# Patient Record
Sex: Female | Born: 1945 | Race: White | Hispanic: No | Smoking: Never smoker
Health system: Southern US, Community
[De-identification: ages and names within clinical notes are randomized; demographics above are authoritative.]

## PROBLEM LIST (undated history)

## (undated) DIAGNOSIS — N183 Chronic kidney disease, stage 3 unspecified: Secondary | ICD-10-CM

## (undated) DIAGNOSIS — R5383 Other fatigue: Secondary | ICD-10-CM

## (undated) DIAGNOSIS — R519 Headache, unspecified: Secondary | ICD-10-CM

## (undated) DIAGNOSIS — R112 Nausea with vomiting, unspecified: Secondary | ICD-10-CM

## (undated) DIAGNOSIS — F329 Major depressive disorder, single episode, unspecified: Secondary | ICD-10-CM

## (undated) DIAGNOSIS — M797 Fibromyalgia: Secondary | ICD-10-CM

## (undated) DIAGNOSIS — F419 Anxiety disorder, unspecified: Secondary | ICD-10-CM

## (undated) DIAGNOSIS — K5900 Constipation, unspecified: Secondary | ICD-10-CM

## (undated) DIAGNOSIS — R079 Chest pain, unspecified: Secondary | ICD-10-CM

## (undated) DIAGNOSIS — I509 Heart failure, unspecified: Secondary | ICD-10-CM

## (undated) DIAGNOSIS — G939 Disorder of brain, unspecified: Secondary | ICD-10-CM

## (undated) DIAGNOSIS — E119 Type 2 diabetes mellitus without complications: Secondary | ICD-10-CM

## (undated) DIAGNOSIS — M4712 Other spondylosis with myelopathy, cervical region: Secondary | ICD-10-CM

## (undated) DIAGNOSIS — R251 Tremor, unspecified: Secondary | ICD-10-CM

## (undated) DIAGNOSIS — E785 Hyperlipidemia, unspecified: Secondary | ICD-10-CM

## (undated) DIAGNOSIS — G43909 Migraine, unspecified, not intractable, without status migrainosus: Secondary | ICD-10-CM

## (undated) DIAGNOSIS — J189 Pneumonia, unspecified organism: Secondary | ICD-10-CM

## (undated) DIAGNOSIS — Z9989 Dependence on other enabling machines and devices: Secondary | ICD-10-CM

## (undated) DIAGNOSIS — I10 Essential (primary) hypertension: Secondary | ICD-10-CM

## (undated) DIAGNOSIS — M545 Low back pain, unspecified: Secondary | ICD-10-CM

## (undated) DIAGNOSIS — G8929 Other chronic pain: Secondary | ICD-10-CM

## (undated) DIAGNOSIS — D649 Anemia, unspecified: Secondary | ICD-10-CM

## (undated) DIAGNOSIS — M47816 Spondylosis without myelopathy or radiculopathy, lumbar region: Secondary | ICD-10-CM

## (undated) DIAGNOSIS — H269 Unspecified cataract: Secondary | ICD-10-CM

## (undated) DIAGNOSIS — Z9289 Personal history of other medical treatment: Secondary | ICD-10-CM

## (undated) DIAGNOSIS — I890 Lymphedema, not elsewhere classified: Secondary | ICD-10-CM

## (undated) DIAGNOSIS — N3281 Overactive bladder: Secondary | ICD-10-CM

## (undated) DIAGNOSIS — E039 Hypothyroidism, unspecified: Secondary | ICD-10-CM

## (undated) DIAGNOSIS — R0600 Dyspnea, unspecified: Secondary | ICD-10-CM

## (undated) DIAGNOSIS — R51 Headache: Secondary | ICD-10-CM

## (undated) DIAGNOSIS — M1990 Unspecified osteoarthritis, unspecified site: Secondary | ICD-10-CM

## (undated) DIAGNOSIS — K219 Gastro-esophageal reflux disease without esophagitis: Secondary | ICD-10-CM

## (undated) DIAGNOSIS — G2581 Restless legs syndrome: Secondary | ICD-10-CM

## (undated) DIAGNOSIS — Z9889 Other specified postprocedural states: Secondary | ICD-10-CM

## (undated) DIAGNOSIS — R609 Edema, unspecified: Secondary | ICD-10-CM

## (undated) DIAGNOSIS — I639 Cerebral infarction, unspecified: Secondary | ICD-10-CM

## (undated) DIAGNOSIS — M069 Rheumatoid arthritis, unspecified: Secondary | ICD-10-CM

## (undated) DIAGNOSIS — K911 Postgastric surgery syndromes: Secondary | ICD-10-CM

## (undated) DIAGNOSIS — F0390 Unspecified dementia without behavioral disturbance: Secondary | ICD-10-CM

## (undated) DIAGNOSIS — R55 Syncope and collapse: Secondary | ICD-10-CM

## (undated) DIAGNOSIS — F32A Depression, unspecified: Secondary | ICD-10-CM

## (undated) DIAGNOSIS — G4733 Obstructive sleep apnea (adult) (pediatric): Secondary | ICD-10-CM

## (undated) HISTORY — DX: Unspecified osteoarthritis, unspecified site: M19.90

## (undated) HISTORY — PX: CATARACT EXTRACTION W/ INTRAOCULAR LENS  IMPLANT, BILATERAL: SHX1307

## (undated) HISTORY — DX: Spondylosis without myelopathy or radiculopathy, lumbar region: M47.816

## (undated) HISTORY — DX: Disorder of brain, unspecified: G93.9

## (undated) HISTORY — DX: Depression, unspecified: F32.A

## (undated) HISTORY — PX: TOTAL KNEE ARTHROPLASTY: SHX125

## (undated) HISTORY — DX: Fibromyalgia: M79.7

## (undated) HISTORY — DX: Cerebral infarction, unspecified: I63.9

## (undated) HISTORY — DX: Essential (primary) hypertension: I10

## (undated) HISTORY — DX: Hyperlipidemia, unspecified: E78.5

## (undated) HISTORY — PX: TOTAL ABDOMINAL HYSTERECTOMY: SHX209

## (undated) HISTORY — PX: BACK SURGERY: SHX140

## (undated) HISTORY — PX: JOINT REPLACEMENT: SHX530

## (undated) HISTORY — PX: UPPER GI ENDOSCOPY: SHX6162

## (undated) HISTORY — DX: Heart failure, unspecified: I50.9

## (undated) HISTORY — PX: TUBAL LIGATION: SHX77

## (undated) HISTORY — DX: Unspecified cataract: H26.9

## (undated) HISTORY — DX: Lymphedema, not elsewhere classified: I89.0

## (undated) HISTORY — DX: Anxiety disorder, unspecified: F41.9

## (undated) HISTORY — PX: EXCISION/RELEASE BURSA HIP: SHX5014

## (undated) HISTORY — DX: Other fatigue: R53.83

## (undated) HISTORY — DX: Other spondylosis with myelopathy, cervical region: M47.12

## (undated) HISTORY — PX: SHOULDER ARTHROSCOPY WITH ROTATOR CUFF REPAIR: SHX5685

## (undated) HISTORY — PX: KNEE ARTHROSCOPY: SHX127

## (undated) HISTORY — DX: Major depressive disorder, single episode, unspecified: F32.9

## (undated) HISTORY — DX: Personal history of other medical treatment: Z92.89

## (undated) HISTORY — DX: Edema, unspecified: R60.9

## (undated) HISTORY — PX: LUMBAR DISC SURGERY: SHX700

## (undated) HISTORY — DX: Postgastric surgery syndromes: K91.1

## (undated) HISTORY — PX: UPPER GASTROINTESTINAL ENDOSCOPY: SHX188

## (undated) HISTORY — PX: COLONOSCOPY: SHX174

## (undated) HISTORY — DX: Chest pain, unspecified: R07.9

## (undated) HISTORY — DX: Syncope and collapse: R55

---

## 1964-08-24 HISTORY — PX: APPENDECTOMY: SHX54

## 1999-01-24 ENCOUNTER — Encounter: Payer: Self-pay | Admitting: Neurosurgery

## 1999-01-24 ENCOUNTER — Ambulatory Visit (HOSPITAL_COMMUNITY): Admission: RE | Admit: 1999-01-24 | Discharge: 1999-01-24 | Payer: Self-pay | Admitting: Neurosurgery

## 1999-02-20 ENCOUNTER — Other Ambulatory Visit: Admission: RE | Admit: 1999-02-20 | Discharge: 1999-02-20 | Payer: Self-pay

## 1999-09-23 ENCOUNTER — Encounter: Admission: RE | Admit: 1999-09-23 | Discharge: 1999-12-22 | Payer: Self-pay | Admitting: Specialist

## 2000-06-12 ENCOUNTER — Encounter: Payer: Self-pay | Admitting: Neurosurgery

## 2000-06-12 ENCOUNTER — Ambulatory Visit (HOSPITAL_COMMUNITY): Admission: RE | Admit: 2000-06-12 | Discharge: 2000-06-13 | Payer: Self-pay | Admitting: Neurosurgery

## 2000-06-13 ENCOUNTER — Encounter: Payer: Self-pay | Admitting: Neurosurgery

## 2001-08-10 ENCOUNTER — Encounter: Admission: RE | Admit: 2001-08-10 | Discharge: 2001-08-10 | Payer: Self-pay | Admitting: Neurosurgery

## 2001-08-10 ENCOUNTER — Encounter: Payer: Self-pay | Admitting: Neurosurgery

## 2002-03-02 ENCOUNTER — Encounter: Admission: RE | Admit: 2002-03-02 | Discharge: 2002-03-02 | Payer: Self-pay | Admitting: Neurosurgery

## 2002-03-02 ENCOUNTER — Encounter: Payer: Self-pay | Admitting: Neurosurgery

## 2002-05-07 ENCOUNTER — Observation Stay (HOSPITAL_COMMUNITY): Admission: EM | Admit: 2002-05-07 | Discharge: 2002-05-08 | Payer: Self-pay | Admitting: Emergency Medicine

## 2002-05-07 ENCOUNTER — Encounter: Payer: Self-pay | Admitting: Emergency Medicine

## 2002-08-04 ENCOUNTER — Encounter: Payer: Self-pay | Admitting: Neurosurgery

## 2002-08-04 ENCOUNTER — Encounter: Admission: RE | Admit: 2002-08-04 | Discharge: 2002-08-04 | Payer: Self-pay | Admitting: Neurosurgery

## 2002-11-27 ENCOUNTER — Encounter: Payer: Self-pay | Admitting: Neurosurgery

## 2002-11-27 ENCOUNTER — Encounter: Admission: RE | Admit: 2002-11-27 | Discharge: 2002-11-27 | Payer: Self-pay | Admitting: Neurosurgery

## 2002-12-18 ENCOUNTER — Encounter: Payer: Self-pay | Admitting: Rheumatology

## 2002-12-18 ENCOUNTER — Encounter: Admission: RE | Admit: 2002-12-18 | Discharge: 2002-12-18 | Payer: Self-pay | Admitting: Rheumatology

## 2003-03-18 ENCOUNTER — Emergency Department (HOSPITAL_COMMUNITY): Admission: EM | Admit: 2003-03-18 | Discharge: 2003-03-18 | Payer: Self-pay | Admitting: Emergency Medicine

## 2003-09-06 ENCOUNTER — Encounter: Admission: RE | Admit: 2003-09-06 | Discharge: 2003-09-06 | Payer: Self-pay | Admitting: Neurosurgery

## 2003-10-23 ENCOUNTER — Encounter: Admission: RE | Admit: 2003-10-23 | Discharge: 2003-10-23 | Payer: Self-pay | Admitting: Internal Medicine

## 2003-12-21 ENCOUNTER — Ambulatory Visit (HOSPITAL_COMMUNITY): Admission: RE | Admit: 2003-12-21 | Discharge: 2003-12-21 | Payer: Self-pay | Admitting: Orthopedic Surgery

## 2004-01-31 ENCOUNTER — Encounter: Admission: RE | Admit: 2004-01-31 | Discharge: 2004-01-31 | Payer: Self-pay | Admitting: Neurosurgery

## 2004-02-18 ENCOUNTER — Encounter: Admission: RE | Admit: 2004-02-18 | Discharge: 2004-02-18 | Payer: Self-pay | Admitting: Neurosurgery

## 2004-03-06 ENCOUNTER — Encounter: Admission: RE | Admit: 2004-03-06 | Discharge: 2004-03-06 | Payer: Self-pay | Admitting: Neurosurgery

## 2004-05-09 ENCOUNTER — Ambulatory Visit (HOSPITAL_COMMUNITY): Admission: RE | Admit: 2004-05-09 | Discharge: 2004-05-10 | Payer: Self-pay | Admitting: Orthopedic Surgery

## 2004-06-06 ENCOUNTER — Ambulatory Visit (HOSPITAL_COMMUNITY): Admission: RE | Admit: 2004-06-06 | Discharge: 2004-06-07 | Payer: Self-pay | Admitting: Orthopedic Surgery

## 2004-07-10 ENCOUNTER — Emergency Department (HOSPITAL_COMMUNITY): Admission: EM | Admit: 2004-07-10 | Discharge: 2004-07-10 | Payer: Self-pay | Admitting: *Deleted

## 2004-09-10 ENCOUNTER — Encounter: Admission: RE | Admit: 2004-09-10 | Discharge: 2004-09-10 | Payer: Self-pay | Admitting: Internal Medicine

## 2004-10-30 ENCOUNTER — Ambulatory Visit (HOSPITAL_COMMUNITY): Admission: RE | Admit: 2004-10-30 | Discharge: 2004-10-30 | Payer: Self-pay | Admitting: Neurosurgery

## 2004-10-31 ENCOUNTER — Inpatient Hospital Stay (HOSPITAL_COMMUNITY): Admission: RE | Admit: 2004-10-31 | Discharge: 2004-11-04 | Payer: Self-pay | Admitting: Orthopedic Surgery

## 2004-10-31 ENCOUNTER — Ambulatory Visit: Payer: Self-pay | Admitting: Physical Medicine & Rehabilitation

## 2004-12-09 ENCOUNTER — Encounter: Admission: RE | Admit: 2004-12-09 | Discharge: 2005-03-09 | Payer: Self-pay | Admitting: Orthopedic Surgery

## 2005-05-08 ENCOUNTER — Ambulatory Visit (HOSPITAL_COMMUNITY): Admission: RE | Admit: 2005-05-08 | Discharge: 2005-05-08 | Payer: Self-pay | Admitting: Internal Medicine

## 2005-05-21 ENCOUNTER — Encounter: Admission: RE | Admit: 2005-05-21 | Discharge: 2005-05-21 | Payer: Self-pay | Admitting: Neurosurgery

## 2005-06-02 ENCOUNTER — Encounter: Admission: RE | Admit: 2005-06-02 | Discharge: 2005-06-02 | Payer: Self-pay | Admitting: Neurosurgery

## 2005-06-10 ENCOUNTER — Encounter: Admission: RE | Admit: 2005-06-10 | Discharge: 2005-06-10 | Payer: Self-pay | Admitting: Internal Medicine

## 2005-06-15 ENCOUNTER — Encounter: Admission: RE | Admit: 2005-06-15 | Discharge: 2005-06-15 | Payer: Self-pay | Admitting: Neurosurgery

## 2005-06-19 ENCOUNTER — Ambulatory Visit (HOSPITAL_BASED_OUTPATIENT_CLINIC_OR_DEPARTMENT_OTHER): Admission: RE | Admit: 2005-06-19 | Discharge: 2005-06-19 | Payer: Self-pay | Admitting: Orthopedic Surgery

## 2005-06-19 ENCOUNTER — Ambulatory Visit (HOSPITAL_COMMUNITY): Admission: RE | Admit: 2005-06-19 | Discharge: 2005-06-19 | Payer: Self-pay | Admitting: Orthopedic Surgery

## 2005-10-23 ENCOUNTER — Ambulatory Visit (HOSPITAL_COMMUNITY): Admission: RE | Admit: 2005-10-23 | Discharge: 2005-10-23 | Payer: Self-pay | Admitting: Neurosurgery

## 2005-12-22 HISTORY — PX: LUMBAR LAMINECTOMY/DECOMPRESSION MICRODISCECTOMY: SHX5026

## 2006-01-01 ENCOUNTER — Inpatient Hospital Stay (HOSPITAL_COMMUNITY): Admission: RE | Admit: 2006-01-01 | Discharge: 2006-01-03 | Payer: Self-pay | Admitting: Neurosurgery

## 2006-03-28 ENCOUNTER — Encounter: Admission: RE | Admit: 2006-03-28 | Discharge: 2006-03-28 | Payer: Self-pay | Admitting: Neurosurgery

## 2006-04-24 HISTORY — PX: POSTERIOR LUMBAR FUSION: SHX6036

## 2006-05-18 ENCOUNTER — Inpatient Hospital Stay (HOSPITAL_COMMUNITY): Admission: RE | Admit: 2006-05-18 | Discharge: 2006-05-21 | Payer: Self-pay | Admitting: Neurosurgery

## 2006-09-21 ENCOUNTER — Encounter: Admission: RE | Admit: 2006-09-21 | Discharge: 2006-09-21 | Payer: Self-pay | Admitting: Internal Medicine

## 2006-11-10 ENCOUNTER — Encounter: Admission: RE | Admit: 2006-11-10 | Discharge: 2006-11-10 | Payer: Self-pay | Admitting: Internal Medicine

## 2006-11-15 ENCOUNTER — Encounter: Admission: RE | Admit: 2006-11-15 | Discharge: 2006-11-15 | Payer: Self-pay | Admitting: Internal Medicine

## 2006-12-29 ENCOUNTER — Encounter (HOSPITAL_COMMUNITY): Admission: RE | Admit: 2006-12-29 | Discharge: 2007-01-22 | Payer: Self-pay | Admitting: Endocrinology

## 2007-01-03 ENCOUNTER — Encounter: Admission: RE | Admit: 2007-01-03 | Discharge: 2007-02-02 | Payer: Self-pay | Admitting: Neurosurgery

## 2007-03-09 ENCOUNTER — Encounter: Admission: RE | Admit: 2007-03-09 | Discharge: 2007-03-09 | Payer: Self-pay | Admitting: Internal Medicine

## 2007-06-30 ENCOUNTER — Encounter: Admission: RE | Admit: 2007-06-30 | Discharge: 2007-06-30 | Payer: Self-pay | Admitting: Orthopedic Surgery

## 2007-08-25 HISTORY — PX: CORONARY ANGIOGRAM: SHX5786

## 2007-12-25 ENCOUNTER — Observation Stay (HOSPITAL_COMMUNITY): Admission: EM | Admit: 2007-12-25 | Discharge: 2007-12-27 | Payer: Self-pay | Admitting: Emergency Medicine

## 2008-01-10 ENCOUNTER — Ambulatory Visit (HOSPITAL_COMMUNITY): Admission: RE | Admit: 2008-01-10 | Discharge: 2008-01-10 | Payer: Self-pay | Admitting: *Deleted

## 2008-01-19 ENCOUNTER — Inpatient Hospital Stay (HOSPITAL_BASED_OUTPATIENT_CLINIC_OR_DEPARTMENT_OTHER): Admission: RE | Admit: 2008-01-19 | Discharge: 2008-01-19 | Payer: Self-pay | Admitting: Cardiology

## 2008-03-09 ENCOUNTER — Encounter: Admission: RE | Admit: 2008-03-09 | Discharge: 2008-03-09 | Payer: Self-pay | Admitting: Family Medicine

## 2009-06-28 ENCOUNTER — Encounter: Admission: RE | Admit: 2009-06-28 | Discharge: 2009-06-28 | Payer: Self-pay | Admitting: Family Medicine

## 2009-07-12 ENCOUNTER — Encounter: Admission: RE | Admit: 2009-07-12 | Discharge: 2009-07-12 | Payer: Self-pay | Admitting: Family Medicine

## 2010-09-14 ENCOUNTER — Encounter: Payer: Self-pay | Admitting: Neurosurgery

## 2010-09-14 ENCOUNTER — Encounter: Payer: Self-pay | Admitting: *Deleted

## 2010-12-01 ENCOUNTER — Ambulatory Visit: Payer: Medicare Other | Attending: Orthopedic Surgery | Admitting: Physical Therapy

## 2010-12-01 DIAGNOSIS — R5381 Other malaise: Secondary | ICD-10-CM | POA: Insufficient documentation

## 2010-12-01 DIAGNOSIS — M6281 Muscle weakness (generalized): Secondary | ICD-10-CM | POA: Insufficient documentation

## 2010-12-01 DIAGNOSIS — M25519 Pain in unspecified shoulder: Secondary | ICD-10-CM | POA: Insufficient documentation

## 2010-12-01 DIAGNOSIS — IMO0001 Reserved for inherently not codable concepts without codable children: Secondary | ICD-10-CM | POA: Insufficient documentation

## 2010-12-03 ENCOUNTER — Ambulatory Visit: Payer: Medicare Other | Admitting: Physical Therapy

## 2010-12-08 ENCOUNTER — Ambulatory Visit: Payer: Medicare Other | Admitting: Physical Therapy

## 2010-12-10 ENCOUNTER — Ambulatory Visit: Payer: Medicare Other | Admitting: Physical Therapy

## 2010-12-15 ENCOUNTER — Ambulatory Visit: Payer: Medicare Other | Admitting: Physical Therapy

## 2010-12-17 ENCOUNTER — Ambulatory Visit: Payer: Medicare Other | Admitting: Physical Therapy

## 2010-12-22 ENCOUNTER — Ambulatory Visit: Payer: Medicare Other | Admitting: Physical Therapy

## 2010-12-24 ENCOUNTER — Ambulatory Visit: Payer: Medicare Other | Attending: Orthopedic Surgery | Admitting: Physical Therapy

## 2010-12-24 DIAGNOSIS — M6281 Muscle weakness (generalized): Secondary | ICD-10-CM | POA: Insufficient documentation

## 2010-12-24 DIAGNOSIS — R5381 Other malaise: Secondary | ICD-10-CM | POA: Insufficient documentation

## 2010-12-24 DIAGNOSIS — IMO0001 Reserved for inherently not codable concepts without codable children: Secondary | ICD-10-CM | POA: Insufficient documentation

## 2010-12-24 DIAGNOSIS — M25519 Pain in unspecified shoulder: Secondary | ICD-10-CM | POA: Insufficient documentation

## 2010-12-29 ENCOUNTER — Ambulatory Visit: Payer: Medicare Other | Admitting: Physical Therapy

## 2010-12-31 ENCOUNTER — Encounter: Payer: Medicare Other | Admitting: Physical Therapy

## 2011-01-06 NOTE — Op Note (Signed)
[name], [name]              ACCOUNT NO.:  [id number]   MEDICAL RECORD NO.:  [id number]          PATIENT TYPE:  OIB   LOCATION:  2854                         FACILITY:  [facility]   PHYSICIAN:  Elmore Guise., M.D.DATE OF BIRTH:  04-Mar-1946   DATE OF PROCEDURE:  01/10/2008  DATE OF DISCHARGE:  01/10/2008                               OPERATIVE REPORT   INDICATIONS FOR PROCEDURE:  Syncope evaluate for vasovagal or  neurocardiogenic syncope.  Primary cardiologist, Dr. Peter Swaziland  Supervising cardiologist, Dr. Lady Deutscher.   PROCEDURE:  Tilt table test with and without isoproterenol.  Procedure  in risks were explained and informed consent was obtained.  The patient  was brought to the tilt table lab in a fasting nonsedated state.  A  peripheral IV had been placed in short stay.  After the patient was  placed on blood pressure, ECG, and pulse oximetry monitoring baseline  recordings in the supine position were done for 5 minutes.  The patient  was then placed in a 70-degree head up tilt position and monitored.  At  the end of 30 minutes, the patient was returned to the supine position.  Isoproterenol was begun at 0.5 mics per minute IV and increased until  the patient achieved a heart rate greater than 25% of resting.  The  patient then was placed back in the 70-degree tilt position.  This  position was maintained for 15 minutes.  Following 15 minutes upright  with isoproterenol infusion.  The isoproterenol infusion was stopped.  The patient was placed back in the supine position.  Vital signs were  recorded and monitored to return to baseline.  The patient was then  transferred back to the short-stay area in good condition.  Dr. Reyes Ivan  was present for the entire procedure.   RESULTS:  1. Baseline supine vital signs.  Showed heart rate of 67 per minute.      Normal sinus rhythm with blood pressure 176/87, saturation of 100%,      upright tilt at 70 degrees.  Initial vital  signs, heart rate of 79      beats per minute, blood pressure 184/105, and oxygen sat is 100%.      The patient started having heaviness in her chest at approximately      9 minutes into the upright tilt.  At that time, she reported right      leg shakiness.  Her heart rate increased to 104 beats per minute      showing sinus tach on telemetry.  Her blood pressure was 145/70.      Her O2 sat was 100%.  She continued of feeling shaky.  Her heart      rate ranged from 104-138 with blood pressures 150/80 to 186/119.      The patient denied any significant lightheadedness during this part      of the test.  After 30 minutes, the patient was laid back in the      supine position.  Her heart rate returned to normal with baseline      heart rate of 70-80  beats per minute.  Isuprel was started at 0.5      mics per minute and increased until she reached greater than 20%,      increase in heart rate.  Once her heart rate reach to 95 beats per      minute, she was tilted back at the 70-degree upright tilt position.      Her heart rate at this time in the upright position increased up to      145 beats per minute showing sinus tachycardia on telemetry      monitoring.  Her blood pressure was 162/88.  She did have one      isolated blood pressure of 63/50 on Dinamap, however, this was      rechecked and showed 142/79.  Her blood pressure then ranged from      141/60 to 154/90 with heart rates ranging from 150 at sinus tach      120 at sinus tach.  Her O2 sat remained at 98-99%.  Isuprel was      then stopped.  The patient was placed back in the supine position.      Her heart rate returned to 102 beats per minute with blood pressure      143/90.  Heart rate then decreased down into the 70-80 range and      blood pressure 140/90 to 150/100.  The patient tolerated procedure      well and was transferred back to short-stay without problem or      complication.   IMPRESSION:  Negative tilt table study  with and without isoproterenol.   RECOMMENDATIONS:  Discussed the results with the patient and her family.  If her symptoms continue would consider possible loop implant.  I will  discuss findings with Dr. Swaziland, her primary cardiologist, as to which  way he would like to proceed from here.      Elmore Guise., M.D.  Electronically Signed     TWK/MEDQ  D:  01/10/2008  T:  01/11/2008  Job:  [id number]   cc:   Peter M. Swaziland, M.D.

## 2011-01-06 NOTE — Cardiovascular Report (Signed)
NAMEALLIZON, [name]              ACCOUNT NO.:  [id number]   MEDICAL RECORD NO.:  [id number]          PATIENT TYPE:  OIB   LOCATION:  1967                         FACILITY:  [facility]   PHYSICIAN:  Peter M. Swaziland, M.D.  DATE OF BIRTH:  [birth year]   DATE OF PROCEDURE:  DATE OF DISCHARGE:  01/19/2008                            CARDIAC CATHETERIZATION   INDICATIONS FOR PROCEDURE:  This is a 65 year old [name] female who has  refractory symptoms of chest pain.  Prior cardiac evaluation including  Holter monitor, echocardiogram, adenosine Cardiolite study and tilt  table test have been negative.  She does have risk factors of obesity,  hypertension, and family history of coronary artery disease.   PROCEDURE:  Left heart catheterization, coronary, and left ventricular  angiography.   EQUIPMENT USED:  A 4-French 4-cm left Judkins catheter, 4-French 3DRC  catheter, 4-French pigtail catheter, and 4-French arterial sheath.  Access via the right femoral artery using standard Seldinger technique.   MEDICATIONS:  1. Versed 3 mg IV.  2. Fentanyl 25 mcg IV.  3. Xylocaine 1% locally for anesthesia.   HEMODYNAMIC RESULTS:  Aortic pressures 145/62 with a mean of 95 mmHg,  left ventricle pressure 141 with EDP of 17 mmHg.  There is no  significant aortic valve gradient.   Angiographic data of the left coronary arises and distributes normally.  The left main coronary artery is normal.   The left anterior descending artery has a large vessel and it appears  normal.   The left circumflex coronary artery is essentially one large obtuse  marginal vessel and it is normal.   The right coronary is a dominant vessel.  It is normal throughout its  course.   Left ventricular angiography was performed in RAO view.  This  demonstrates normal left ventricular size and contractility with normal  ejection fraction of 60%-65%.  There is no mitral regurgitation.  The  aortic root appears mildly dilated.   FINAL INTERPRETATION:  1. Normal coronary anatomy.  2. Normal left ventricular function.          ______________________________  Peter M. Swaziland, M.D.    PMJ/MEDQ  D:  01/19/2008  T:  01/20/2008  Job:  [id number]

## 2011-01-06 NOTE — H&P (Signed)
NAMEVEDANSHI, MASSARO              ACCOUNT NO.:  [id number]   MEDICAL RECORD NO.:  [id number]           PATIENT TYPE:   LOCATION:                                 FACILITY:   PHYSICIAN:  Peter M. Swaziland, M.D.  DATE OF BIRTH:  [birth year]   DATE OF ADMISSION:  01/11/2008  DATE OF DISCHARGE:                              HISTORY & PHYSICAL   HISTORY OF PRESENT ILLNESS:  Ms. Partridge is a 65 year old [place] female who  is admitted for cardiac catheterization for evaluation of ongoing chest  pain.  The patient has had extensive evaluation recently from a cardiac  standpoint.  She presented with symptoms of fatigue, near syncope, and  recurrent chest pain.  The patient was admitted from Dec 25, 2007 to Dec 27, 2007, after an episode of near syncope at church.  She had also been  admitted to the Hospital [facility] in late April 2009, after she  had been placed on diastolic and was found to be markedly bradycardic  and extremely fatigued.  At that time, her diastolic was discontinued.  She subsequently has had a 48-hour Holter monitor, which demonstrated  normal sinus rhythm with sinus bradycardia with average heart rate of  around 61.  She had no prolonged sustained severe bradycardia or pauses.  There were no symptoms recorded.  During her last hospital stay, she was  monitored for 48 hours without significant arrhythmia.  She has had an  echocardiogram dated Dec 23, 2007, this demonstrated mild concentric LVH  with normal systolic function.  She had normal valvular appearance and  function.  She also underwent an adenosine Cardiolite study on Dec 30, 2007, which was a normal study and she had a normal ejection fraction.  Most recent evaluation was with a tilt table test by Dr. Lady Deutscher,  which was a normal study.  The patient continues to complains of  episodes of substernal chest pain and heaviness.  She has no radiation  of her discomfort.  She denies any shortness of breath,  orthopnea, or  PND.  She has had no increase in edema.  She is still very concerned  about the potential of coronary blockage that was missed by stress  testing and therefore for definitive diagnosis, we have recommended  cardiac catheterization.  She still complains of symptoms of marked  fatigue, but has had no recurrent episodes of near syncope.   PAST MEDICAL HISTORY:  Significant for intermittent dumping syndrome.  She has a history of thyroid disease.  She has a history of arthritis  and status post knee replacement.  She has a history of gastroesophageal  reflux disease.  She has had three prior back surgeries and she has also  had previous rotator cuff repair.  She has had prior hysterectomy.   ALLERGIES:  She has no known drug allergies, but is intolerant to BETA  BLOCKERS due to marked bradycardia.   CURRENT MEDICATIONS:  1. Hydrochlorothiazide 12.5 mg daily.  2. Vicodin 5/500 mg 2 or 3 times daily as needed.  3. Protonix 40 mg per day.  4. Synthroid  75 mcg per day.  5. Celebrex 200 mg b.i.d.  6. Xanax 0.5 mg p.r.n.  7. She gets a B12 injection monthly.  8. Tandem plus b.i.d.   SOCIAL HISTORY:  The patient is disabled.  She denies tobacco or alcohol  use.  She is a widow.  She has 3 children.   FAMILY HISTORY:  Father died aged 22 with lung cancer.  Mother died aged  76, she had coronary artery disease and congestive heart failure.  One  brother died with cancer, 2 other brothers have died with heart disease.  One sister aged 58 is in good health.   REVIEW OF SYSTEMS:  Diffusely positive.  The patient has had episodes of  abdominal cramping, diarrhea, nausea, vomiting, and flushing.  She  complains that her skin becomes very blotchy during these episodes.  During some of these episodes, she has had severe hypertension that will  resolve by the time she is evaluated.  Under direction of Dr. Lysbeth Galas,  she has had evaluation including panendoscopy within the past year.   She  has also been referred to an endocrinologist in [place], but  despite some of her symptoms have not been able to find any significant  evidence of either carcinoid tumor or pheochromocytoma.   PHYSICAL EXAMINATION:  GENERAL:  The patient is an obese [place] female in  no distress.  VITAL SIGNS:  Weight is 253 pounds, pulse is 88 and regular, blood  pressure is 120/70.  HEENT:  Unremarkable.  NECK:  She has no JVD, adenopathy, thyromegaly or bruits.  LUNGS:  Clear.  CARDIAC:  Without gallop, murmur, rub, or click.  EXTREMITIES:  She has no lower extremity edema.  SKIN:  Her skin color is pasty and color is poor.  NEUROLOGIC:  Her neurologic exam is nonfocal.   IMPRESSION:  1. Ongoing chest pain, extensive cardiac evaluation till date has been      unremarkable; however, the patient does have cardiac risk factors      and ongoing pain of unclear etiology, definitive evaluation with      coronary status is recommended with cardiac catheterization.  2. Episodes of marked fatigue and near syncope.  Cardiac evaluation      again has been negative.  3. Intolerance to beta blockade.  4. Intermittent dumping syndrome.  5. Gastroesophageal reflux disease.  6. Hypertension.  7. Obesity.  8. Arthritis.   PLAN:  We will proceed with cardiac catheterization for definitive  evaluation of her coronary status.           ______________________________  Peter M. Swaziland, M.D.     PMJ/MEDQ  D:  01/12/2008  T:  01/13/2008  Job:  [id number]   cc:   Delaney Meigs, M.D.

## 2011-01-06 NOTE — Discharge Summary (Signed)
[name], [name]              ACCOUNT NO.:  [id number]   MEDICAL RECORD NO.:  [id number]          PATIENT TYPE:  INP   LOCATION:  3737                         FACILITY:  [facility]   PHYSICIAN:  Peter M. Swaziland, M.D.  DATE OF BIRTH:  January 25, 1946   DATE OF ADMISSION:  12/25/2007  DATE OF DISCHARGE:  12/27/2007                               DISCHARGE SUMMARY   HISTORY OF PRESENT ILLNESS:  [name] is a 65 year old [place] female who  presented after an episode of near syncope at church.  The patient was  at church the morning of admission and she was standing in the [place].  She had a prodrome of weakness, a sense of impending doom and  diaphoresis.  She sat down, but subsequently slumped over.  She states  she was still able to hear voices at this time and then subsequently  came to.  The patient was recently evaluated in my office on December 21, 2007, for symptoms of recurrent episodes similar to this.  She had  actually been evaluated at a hospital in [facility] recently after she  had had an episode where she was unable to be woken and was found to be  markedly bradycardic with heart rate down to 38.  She had been on  Bystolic and this was discontinued.  The patient has had episodes of  marked fatigue.  She feels like she just needs to take a nap and she  feels tired.  She has also had symptoms of a dumping syndrome where she  will suddenly get abdominal cramping, profuse diarrhea and these  episodes have been associated with tachycardia and elevated blood  pressure.  Previously, the family reported that she had been diagnosed  with carcinoid syndrome but on further discussions with Dr. Lysbeth Galas,  there has been no biomarker evidence of either pheochromocytoma or  carcinoid syndrome and the patient had evaluation by endocrinologist at  [facility] that was unrevealing.  She has had prior neurologic  evaluation with either CT or MRI of the head.  After we saw her in the  office this  week, we obtained an echocardiogram which was normal.  She  also had a 48-hour Holter monitor which demonstrated only 2 PVCs in 24  hours and 58 PACs.  There were no sustained tachy or bradyarrhythmias.  There was no AV block.   For details of her past medical history, social history, family history  and physical exam, please see admission history and physical was  noteworthy that on admission, the blood pressure was 140/65 and pulse  was 64 and regular.  Her ECG showed normal sinus rhythm with a normal  ECG and normal intervals.  Chest x-ray showed mild cardiomegaly without  evidence of acute disease.  Doucet count was 7000, hemoglobin 11.5,  hematocrit 34.4, and platelets 264,000.  Sodium is 140, potassium 3.7,  chloride 106, CO2 24, BUN 11, creatinine 1.09, glucose of 130.  Coags  were normal.  Liver function studies were normal.  Serial cardiac  enzymes including CPK-MB and troponins were negative x3.  Calcium was  9.5  HOSPITAL COURSE:  The patient was admitted to telemetry monitoring.  She  was observed for 2 days in the hospital.  She had no cardiac  arrhythmias, no significant bradycardia or tachycardic episodes.  She  was ambulated and had no further problems.  After discussion with Dr.  Lysbeth Galas, it was felt that the patient should be evaluated for possible  ischemic heart disease and she will be arranged to have an outpatient  adenosine Cardiolite study.  If this is unremarkable, then we will  arrange for her to have a tilt table test to rule out neurocardiogenic  syncope.   DISCHARGE DIAGNOSES:  1. Syncope, possible vasovagal episode verses neurocardiogenic      syncope.  2. History of recent chest pain.  3. History of bradycardia exacerbated by Bystolic.  4. Intermittent dumping syndrome of unclear etiology.  5. History of thyroid disease.  6. Arthritis status post knee replacement therapy.  7. Acid reflux disease.   PLAN:  The patient will continue on her prior  medications.  This  includes Protonix 40 mg per day, Synthroid 75 mcg per day, Celebrex 200  mg b.i.d., Tandem Plus b.i.d., hydrochlorothiazide 12.5 mg daily, Xanax  0.5 mg p.r.n., B12 tablet IM once a month.  The patient's adenosine  Cardiolite study will be scheduled in Dr. Elvis Coil office.   DISCHARGE STATUS:  Improved.           ______________________________  Peter M. Swaziland, M.D.     PMJ/MEDQ  D:  12/27/2007  T:  12/28/2007  Job:  [id number]   cc:   Delaney Meigs, M.D.

## 2011-01-06 NOTE — H&P (Signed)
[name], [name]              ACCOUNT NO.:  [id number]   MEDICAL RECORD NO.:  [id number]          PATIENT TYPE:  INP   LOCATION:  3729                         FACILITY:  [facility]   PHYSICIAN:  [name], M.D.   DATE OF BIRTH:  1946/05/31   DATE OF ADMISSION:  12/25/2007  DATE OF DISCHARGE:                              HISTORY & PHYSICAL   REASON FOR ADMISSION:  Requested by Dr. Devoria Albe, emergency room  physician for possible syncope.   SUBJECTIVE:  The patient is a 65 year old Caucasian female who was  evaluated earlier today by Dr. Devoria Albe in the emergency room at [facility] and was felt to be in need of admission because of 2  problems:  1. Fainting/passing out spells occurring 6 times over the past week.      We actually speak to the patient and her family, they can only      describe 4 episodes.  One episode may have actually been a true      blackout spell.  That occurred today at church.  The patient signs      for the hearing impaired and during the service she started to feel      a sense of impeding doom, weak and as though she may faint.  After      the sermon, she sat next to a friend and after sitting down slumped      down to the friend, then has disagreement about everything else      after that.  This is what prompted at the emergency room visit.      Though there is a history of 6 previous episodes, they can only      describe 3 others.  All these started a week ago.  [place] and her daughter could not wake her up from an afternoon nap.      EMS was eventually called, and she was taken to an emergency room      and evaluation was done.  There was some discussion of her heart      rate being in the 40s.  The story is very vague.  She signed off      the emergency room AMA.  Two subsequent episodes involved staring      observed by granddaughters and not well characterized.  She has      never hurt herself in any of these episodes.   She has never fallen.      There is no other associated symptoms.  2. Chest pain.  Poorly characterized by the patient.  It is      subxiphoid.  Not associated with the other episodes of passing      out and are fleeting.  There is no exertional component.   HABITS:  Does not smoke or drink.   SIGNIFICANT MEDICAL PROBLEMS:  1. Fibromyalgia.  2. 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  3. History of carcinoid syndrome.  4. Hypertension.  5. Gastroesophageal reflux.   FAMILY HISTORY:  Noncontributory.   SOCIAL HISTORY:  The patient is married.  She has husband, grown  children, and grandchildren.  She works as a Contractor.   REVIEW OF SYSTEMS:  The patient falls sleepy easily.  Her daughter  denies that the patient snores.  The patient does not sleep well at  night.   MEDICATIONS:  1. Protonix 40 mg per day.  2. Synthroid 75 mcg per day.  3. Celebrex 200 mg b.i.d.  4. Vicodin 5/500 2 tablets each evening.  5. Tandem plus twice a day.  6. HCTZ 12.5 mg per day.  7. Xanax 0.5 mg p.r.n.  8. Norvasc 2.5 mg p.r.n. for systolic pressure greater than 150/90.  9. B12 injections every month.   OBJECTIVE:  VITAL SIGNS:  On exam, blood pressure 140/65, heart rate is  64, and respirations 16.  GENERAL:  The patient is alert and oriented.  She is moderately obese.  SKIN:  Clear.  No nailbed cyanosis.  HEENT:  Unremarkable.  NECK:  No JVD or carotid bruits.  LUNGS:  Clear to auscultation and percussion, no rub.  CARDIAC:  No murmur.  No click.  ABDOMEN:  Soft.  Liver and spleen not palpable.  No obvious masses.  EXTREMITIES:  No edema.  Pedal pulses are difficult to palpate.  Radial  pulses are 2+.  NEURO:  Unremarkable.   IMAGING:  EKG reveals heart rate of 59.  No arrhythmias.   LABORATORY DATA:  Unremarkable so far, but all other data is not  available.   ASSESSMENT:  1. Spells with decreased consciousness/near-syncope of uncertain       cause.  Other potential differential is neurocardiogenic,      psychiatric/anxiety provoked, sleep apnea with hypersomnia      syndrome, other potential problems such as seizures.  2. Chest pain, not typical for coronary artery disease, probably      musculoskeletal.   PLAN:  1. Admit to telemetry.  2. I would discontinue IV nitroglycerin.  This was started by the ER      physicians.  I will rule out myocardial infarction with serial      enzymes.  3. Echo has already been performed in Dr. Swaziland office and that is      normal.  4. I will consider neuro and psychiatric evaluation.  Further      management will be per Dr. Elvis Coil discretion.      [name], M.D.  Electronically Signed     HWS/MEDQ  D:  12/25/2007  T:  12/26/2007  Job:  [id number]

## 2011-01-09 NOTE — H&P (Signed)
NAMELANIE, SCHELLING              ACCOUNT NO.:  [id number]   MEDICAL RECORD NO.:  [id number]          PATIENT TYPE:  INP   LOCATION:  3009                         FACILITY:  [facility]   PHYSICIAN:  Payton Doughty, M.D.      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   HISTORY:  This is a 65 year old, right-handed, [place] lady who about four  months ago underwent a L2-3 diskectomy.  Did extremely well and then was  moving some furniture and had recurrence of back pain.  MRs demonstrated a  disk recurrence at L2-3 and she is admitted for fusion at that level.   PAST MEDICAL HISTORY:  Medical history is fairly unremarkable.   PAST SURGICAL HISTORY:  She has had lumbar laminectomy in the remote past at  L5-S1.   ALLERGIES:  None.   SOCIAL HISTORY:  She does not smoke or drink and is retired.   FAMILY HISTORY:  Mother died at 64. Father is deceased.   REVIEW OF SYSTEMS:  Remarkable for back pain and leg pain.   PHYSICAL EXAMINATION:  HEENT:  Within normal limits.  She has good range of  motion of neck.  CHEST:  Clear.  CARDIAC:  Regular rate and rhythm.  ABDOMEN:  Nontender. No hepatosplenomegaly.  EXTREMITIES:  Without clubbing or cyanosis.  GU:  Deferred.  PULSES:  Peripheral pulses are good.  NEUROLOGIC:  She is awake, alert and oriented.  Cranial nerves intact.  Motor exam shows 5/5 strength throughout the upper and lower extremities  save for the hip flexors on the left.  They are 5-/5.  There is a left L2  sensory deficit.  Reflexes are 1 at the knees, absent at the ankles. Toes  downgoing bilaterally.  Straight leg raise is negative.   MR demonstrates a small disk recurrence at L2-3.   CLINICAL IMPRESSION:  L2-3 disk recurrence, less than six months after  diskectomy.   PLAN:  Lumbar laminectomy, diskectomy and  posterior lumbar interbody fusion  with right-sided fusion cages and probably pedicle screws.  The risks and  benefits of this approach has been discussed with her and she wishes to  proceed.           ______________________________  Payton Doughty, M.D.     MWR/MEDQ  D:  05/18/2006  T:  05/19/2006  Job:  [id number]

## 2011-01-09 NOTE — Op Note (Signed)
NAMELETITA, [name]              ACCOUNT NO.:  [id number]   MEDICAL RECORD NO.:  [id number]          PATIENT TYPE:  INP   LOCATION:  3020                         FACILITY:  [facility]   PHYSICIAN:  Payton Doughty, M.D.      DATE OF BIRTH:  02-20-1946   DATE OF PROCEDURE:  01/01/2006  DATE OF DISCHARGE:                                 OPERATIVE REPORT   PREOPERATIVE DIAGNOSIS:  Herniated disk L2-3 on the left.   POSTOPERATIVE DIAGNOSIS:  Herniated disk L2-3 on the left.   OPERATIVE PROCEDURE:  L2-3 laminectomy and diskectomy.   SURGEON:  Payton Doughty, M.D., neurosurgery.   ANESTHESIA:  General endotracheal.   PREPARATORY:  Sterile Betadine prep and scrub with a alcohol wipe.   COMPLICATIONS:  None.   NURSE ASSISTANT:  [place].   HISTORY:  This is a 65 year old with a disk at L2-3 centrally with more left-  sided symptoms.   DESCRIPTION OF PROCEDURE:  The patient was taken to the operating room,  fully identified and intubated; placed prone on the operating room table.  Following shave, prep and drape in the usual sterile fashion, the skin was  infiltrated with 1% lidocaine with 1:400,000 epinephrine.  The skin was  incised from the top of L1 to the bottom of L2.  The lamina of L1 was  exposed on the left side.  Intraoperative x-ray showed we were at the one to  next level down.  The lamina of L2 was dissected in the subperiosteal plane.  The McCullough retractor placed.  Laminectomy was performed with a high-  speed drill and a Kerrison, up to the top of the ligamentum flavum.  It was  removed in a retrograde fashion, exposing the lateral aspect of the thecal  sac.  This was mobilized centrally, and the herniated disk was immediately  obvious.  The disk was opened and the disk material evacuated.  As the  evacuation of disk material resulted in marked decompression of the thecal  sac as well as the nerve root on that side, the wound was irrigated and  hemostasis assured.   Depo-Medrol -soaked fat was placed in the laminectomy  defect, and this incision was closed successfully in layers with 2-0 Vicryl,  3-0 Vicryl and 2-0 nylon.  Betadine Telfa dressing was applied and made  occlusive at the operative site.  The patient was returned to the recovery  room in good condition.           ______________________________  Payton Doughty, M.D.     MWR/MEDQ  D:  01/01/2006  T:  01/02/2006  Job:  [id number]

## 2011-01-09 NOTE — Op Note (Signed)
NAME:  [name], [name]                        ACCOUNT NO.:  [id number]   MEDICAL RECORD NO.:  [id number]                   PATIENT TYPE:  OIB   LOCATION:  5001                                 FACILITY:  [facility]   PHYSICIAN:  Harvie Junior, M.D.                DATE OF BIRTH:  1946-04-24   DATE OF PROCEDURE:  05/09/2004  DATE OF DISCHARGE:  05/10/2004                                 OPERATIVE REPORT   PREOPERATIVE DIAGNOSIS:  Painful left hip with iliotibial band tendinitis  which has improved with injection therapy but failed further conservative  care.   POSTOPERATIVE DIAGNOSIS:  1.  Painful left hip with iliotibial band tendinitis which has improved with      injection therapy but failed further conservative care.  2.  Bone spur off of the trochanteric region.   PRINCIPAL PROCEDURE:  1.  Operative hip arthroscopy with debridement of bursal tissue.  2.  Open release of iliotibial band and debridement of bursa.  3.  Partial exostectomy of the greater trochanter by way of a spur takedown.   SURGEON:  Jodi Geralds, M.D.   ASSISTANT:  Marshia Ly, M.D.   ANESTHESIA:  General.   BRIEF HISTORY:  This is a 65 year old female with a long history of having  had iliotibial band tendinitis in the left hip as well as the right hip.  She had been treated over about an 18 month period with multiple injections  into the trochanteric area which had done wonderfully for her, but her pain  recurred each time.  We saw her and tried injection therapy, we tried  conservative care with physical therapy, none of this seemed to work and  does continue with complaints of pain in the trochanteric area.  We talked  about release of this trochanteric area.  She was brought to the operating  room for this procedure.   PROCEDURE:  The patient was brought to the operating room and after adequate  anesthesia was obtained with general anesthesia the patient was placed upon  on the operating table.  She  was moved in the right lateral decubitus  position, all bony prominences were well padded and an axillary roll was put  in place.  Attention was then turned to the left hip where routine prep and  drape was undertaken.  At this point, arthroscopic hip instruments were used  to cannulate the IT band from the distal as well as proximal and these two  were touched together underneath the IT band, camera was then instituted and  a shaver was used and a radical bursectomy was undertaken of the iliotibial  band area.  Following this, under direct vision the IT band was released  with a Bovie from distal to proximal and following this the large Metzenbaum  scissors from the vascular service were used to follow along in this groove  and complete the release.  This was done  again under direct vision  arthroscopically.  At this point, attention was turned to the lateral  trochanter which could be palpated through one of the portals which had been  made.  The sharpened area of the trochanter was identified and this was  debrided with a motorized bur.  This was really at the junction of the  gluteus medius and the origin of the vastus lateralis muscle.  Debridement  of this are was undertaken with a bur and the patient then had the camera  replaced.  More bursectomy was undertaken with anterior, posterior and  lateral aspect of the trochanter in the peritrochanteric area.  At this  point, the wound was copiously irrigated and suctioned dry.  These  arthroscopic portals were closed with nylon stitching and sterile  compressive dressing was applied and the patient taken to the recovery room  where she was noted to be in satisfactory condition./   ESTIMATED BLOOD LOSS FOR PROCEDURE:  Less than 50 mL.      JLG/MEDQ  D:  05/09/2004  T:  05/11/2004  Job:  [id number]   cc:   Medical Record

## 2011-01-09 NOTE — Op Note (Signed)
NAME:  [name], [name]              ACCOUNT NO.:  [id number]   MEDICAL RECORD NO.:  [id number]          PATIENT TYPE:  AMB   LOCATION:  DSC                          FACILITY:  [facility]   PHYSICIAN:  Harvie Junior, M.D.   DATE OF BIRTH:  1946/01/12   DATE OF PROCEDURE:  06/19/2005  DATE OF DISCHARGE:                                 OPERATIVE REPORT   PREOPERATIVE DIAGNOSIS:  Medial meniscal tear.   POSTOPERATIVE DIAGNOSES:  1.  Medial meniscal tear.  2.  Chondromalacia of the medial compartment.  3.  Chondromalacia of the lateral compartment.  4.  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   PROCEDURES:  1.  Partial and posterior horn medial meniscectomy.  2.  Debridement of chondromalacia in the lateral femoral compartment.  3.  Debridement of chondromalacia patella.   SURGEON:  Harvie Junior, M.D.   ASSISTANT:  Marshia Ly, P.A.   ANESTHESIA:  General.   HISTORY:  Ms. [name] is a 65 year old female with a long history of having  significant pain in the right knee.  She was having catching and locking  type symptoms.  I talked about treatment options and ultimately felt that  arthroscopic intervention was the most appropriate course of action and she  is brought to the operating room for this procedure.   PROCEDURE:  The patient was brought to the operating room, after which  satisfactory anesthesia was obtained with general affect, the patient was  placed on the operating table.  The right leg was prepped and draped in the  usual sterile fashion.  Following this, routine arthroscopic examination of  the knee revealed there was obvious significant chondromalacia of the medial  femoral condyle, as well as a posterior horn medial meniscal tear.  Following this, the posterior horn of the medial meniscus was debrided.  It  was probed and felt to be stable in all directions.  The medial femoral  condyle was then debrided back to a  smooth and stable rim.  Attention was  turned laterally where there was grade 2 changes in the lateral tibial  plateau and lateral femoral condyle.  Lateral meniscus was within normal  limits.  ACL was within normal limits.  Attention was turned up into the  patellofemoral joint where there was some grade 3 changes in the  patellofemoral trochlea which were debrided, as well as some grade 2 changes  up in the undersurface of the patella.  These were debrided.  A final check  was made for any loose fragmenting pieces.  A 6 L of normal saline  irrigation was instilled in the knee to wash out the knee thoroughly.  At  this point the knee was copiously  irrigated and suctioned dry.  The portals were closed with a bandage.  Sterile compression dressing was applied.  The patient was taken to the  recovery room.  She was noted to be in satisfactory condition.  Estimated  blood loss for the procedure was ____________.      Harvie Junior, M.D.  Electronically Signed  JLG/MEDQ  D:  06/19/2005  T:  06/20/2005  Job:  [id number]

## 2011-01-09 NOTE — Discharge Summary (Signed)
[name], [name]              ACCOUNT NO.:  [id number]   MEDICAL RECORD NO.:  [id number]          PATIENT TYPE:  INP   LOCATION:  3009                         FACILITY:  [facility]   PHYSICIAN:  Payton Doughty, M.D.      DATE OF BIRTH:  1945/11/06   DATE OF ADMISSION:  05/18/2006  DATE OF DISCHARGE:  05/21/2006                                 DISCHARGE SUMMARY   ADMITTING DIAGNOSIS:  Spondylosis recurrent, L2-L3.   DISCHARGE DIAGNOSIS:  Facet fracture L2-L3.   SERVICE:  Neurosurgery.   PROCEDURE:  L2-3 laminectomy, diskectomy, posterior lumbar interbody fusion,  intervertebral fusion cages, posterolateral arthrodesis of P1.   COMPLICATIONS:  None.   DISCHARGE STATUS:  Alive and well by a taxi.   NOTE:  This is a 65 year old, right-handed, [place] woman whose history and  physical is recounted on the chart.  She had a disk done a couple months  ago, was lifting a couch and re-injured her back and presents now for  fusion.  Exam and history are in the H&P.  She was admitted after I obtained  normal laboratory values and underwent a fusion.  It was found that she had  a fracture at the site of her old laminectomy.   Postoperatively, she has done well.  She has got a little bit of right leg  pain.  She is up and mobile without using oral medication for pain control.  Flexeril has been helpful for spasm.  Her incision dry and well healing.   She is being sent home on Neurontin for neuropathic pain, Percocet for  incisional pain, Flexeril for spasms, and Cipro for bladder coverage for 5  days.   FOLLOWUP:  Will be in the [facility] office in about  a week for sutures.           ______________________________  Payton Doughty, M.D.     MWR/MEDQ  D:  05/21/2006  T:  05/22/2006  Job:  [id number]

## 2011-01-09 NOTE — Discharge Summary (Signed)
[name], [name]              ACCOUNT NO.:  [id number]   MEDICAL RECORD NO.:  [id number]          PATIENT TYPE:  INP   LOCATION:  5007                         FACILITY:  [facility]   PHYSICIAN:  Harvie Junior, M.D.   DATE OF BIRTH:  26-Dec-1945   DATE OF ADMISSION:  10/31/2004  DATE OF DISCHARGE:  11/04/2004                                 DISCHARGE SUMMARY   ADMITTING DIAGNOSES:  1.  Degenerative joint disease, left knee, end stage.  2.  Hypertension.  3.  History of chronic anemia.  4.  Obesity.   DISCHARGE DIAGNOSES:  1.  Degenerative joint disease, left knee, end stage.  2.  Hypertension.  3.  History of chronic anemia.  4.  Obesity.  5.  Hypokalemia.   PROCEDURES IN HOSPITAL:  Left total knee arthroplasty, Jodi Geralds, MD,  October 31, 2004.   BRIEF HISTORY:  [name] is a 65 year old female, who has a long history of  left knee pain.  She has night pain and pain with ambulation.  X-rays of the  left knee, which were weightbearing, showed that she had bone-on-bone  arthritis.  She only got temporary relief with arthroscopy of the knee and  cortisone injections and modification of her activity.  It came to the point  where she needed pain medication in order to perform activities of daily  living, and based upon her clinical and radiographic findings, she was felt  to be a candidate for a left total knee arthroplasty and is admitted for  this.   PERTINENT LABORATORY FINDINGS:  A hemoglobin on admission was 12.1,  hematocrit 36.2.  Her BMET was within normal limits on admission.  On postop  day #1, her hemoglobin was 8.8 with hematocrit of 26.1, on postop day #2 it  was 8.9, and on postop day #3 it was 8.1.  Her protime on admission was 13.9  seconds with an INR of 1.1.  On postop day #2 on Coumadin, her INR was 1.7,  on postop day #3, it was 3.1, and on November 04, 2004, it was 2.6.  On postop  day #1, her potassium was slightly decreased at 3.3.   HOSPITAL COURSE:  The  patient was admitted and taken to the operating room  on October 31, 2004, where she underwent a left total knee arthroplasty that  is well-described in Dr. Luiz Blare' operative note.  Postoperatively, she was  put on a PCA morphine pump for pain control and was given Coumadin for DVT  prophylaxis.  A CPM machine was used and was given five doses of IV Ancef 1  gm IV preoperatively.  She was given 80 mg of Gentamycin IV and 1 gm of  Ancef prophylactically.  Physical therapy was ordered for walker ambulation  and weightbearing as tolerated on the left side.  On postop day #1, she had  pain as expected.  She was afebrile and her vital signs were stable.  Her  hemoglobin was 8.8, her BMET was within normal limits, with a slightly  decreased potassium at 3.3.  Her INR was 1.1.  Her __________ was intact  to  the left lower extremity.  She was continued with physical therapy and was  gotten out of bed, and the current care was continued.  On postop day #2,  she was doing well with no complaints, had a low-grade fever of 100.9.  Her  vital signs were stable.  Her dressing was changed to the left knee.  Her  Hemovac drain was pulled.  Her hemoglobin was 8.9.  Her INR was 1.7.  Her  Foley catheter, which had been placed at the time of surgery, was removed,  and her PCA pump was discontinued, and her IV was converted to a Hep-Lock.  She had progressed well with physical therapy.  On postop day #3, she had no  complaints, she was progressing well, had a low-grade fever, INR was up to  3.1.  She was continued on oral iron as her hemoglobin was 8.1.  On postop  day #4, she was without complaints, she was taking fluids and voiding  without difficulty, her vital signs were stable and she was afebrile, her  left knee wound was benign, her calf was soft, __________ was intact  distally, and her INR was 2.6.  She was discharged home in improved  condition, was on a regular diet, was given an RX for Percocet p.r.n.  for  pain, #40, 1-2 q.4-6h p.r.n. pain, Coumadin x1 month postop for DVT  prophylaxis managed by outpatient pharmacy, and Prozac 20 mg, #30, one  daily.  She was instructed to ambulate weightbearing as tolerated on the  left with a walker.  She will need Home Health physical therapy, a home CPM  machine, and home Coumadin management.  She will follow up with Dr. Luiz Blare  in his office in 10 days.       JB/MEDQ  D:  02/06/2005  T:  02/07/2005  Job:  [id number]   cc:   [facility]  Marcello Fennel

## 2011-01-09 NOTE — H&P (Signed)
NAMEFOREVER, ARECHIGA              ACCOUNT NO.:  [id number]   MEDICAL RECORD NO.:  [id number]          PATIENT TYPE:  INP   LOCATION:  2899                         FACILITY:  [facility]   PHYSICIAN:  [name], M.D.      DATE OF BIRTH:  [birth year]   DATE OF ADMISSION:  01/01/2006  DATE OF DISCHARGE:                                HISTORY & PHYSICAL   ADMISSION DIAGNOSIS:  Herniated disc L2-3.   HISTORY OF PRESENT ILLNESS:  This is a 65 year old right-handed [name] lady I  have been following for numerous years. She has lumbar spondylosis and pain  beginning in her back and both legs.  MR showed a large herniated disc at L2-  3 and she is admitted for discectomy.   MEDICATIONS:  Lasix, Premarin and Tylenol No. 3 and Advil.   ALLERGIES:  NO KNOWN DRUG ALLERGIES.   PAST MEDICAL HISTORY:  1.  Hypertension.  2.  Obesity.  3.  Fluid retention.   PAST SURGICAL HISTORY:  1.  Appendectomy in 1967.  2.  Hysterectomy in 1984.  3.  Foot operation in 1990.  4.  Oophorectomy in 1995.   SOCIAL HISTORY:  She does not smoke or drink and is on disability.   FAMILY HISTORY:  Not given.   REVIEW OF SYSTEMS:  Remarkable for back pain and leg pain.   PHYSICAL EXAMINATION:  HEENT:  Within normal limits.  NECK:  She has good range of motion of her neck.  CHEST:  Clear.  CARDIOVASCULAR:  Regular rate and rhythm.  ABDOMEN:  Nontender, no hepatosplenomegaly but somewhat large.  EXTREMITIES:  Without clubbing or cyanosis.  GU:  Examination is deferred.  VASCULAR:  Peripheral pulses are good.  NEUROLOGICAL:  She is awake, alert and oriented.  Cranial nerves are intact.  Motor exam shows 5/5 strength throughout the upper and lower extremities  save for the hip flexors on the right.  Reflexes are 1 at the knees, 1 at  the ankles, toes downgoing bilaterally. Straight leg raising is positive on  the right.   CLINICAL IMPRESSION:  Herniated disc L2-3.   PLAN:  Lumbar laminectomy and discectomy.  The  risks and benefits of this  approach have been discussed with her and she wished to proceed.           ______________________________  [name], M.D.     MWR/MEDQ  D:  01/01/2006  T:  01/01/2006  Job:  [id number]

## 2011-01-09 NOTE — Op Note (Signed)
NAMESHONTELL, PROSSER              ACCOUNT NO.:  [id number]   MEDICAL RECORD NO.:  [id number]          PATIENT TYPE:  INP   LOCATION:  5007                         FACILITY:  [facility]   PHYSICIAN:  Harvie Junior, M.D.   DATE OF BIRTH:  Apr 02, 1946   DATE OF PROCEDURE:  10/31/2004  DATE OF DISCHARGE:                                 OPERATIVE REPORT   PREOPERATIVE DIAGNOSIS:  End-stage degenerative joint disease, left knee.   POSTOPERATIVE DIAGNOSIS:  End-stage degenerative joint disease, left knee.   PROCEDURE:  Total knee replacement with Sigma system, size 3 tibia, size 3  femur, 10 mm bridging bearing, 35 mm all-poly patella.   SURGEON:  Harvie Junior, MD.   ASSISTANT:  Marshia Ly, PA.   ANESTHESIA:  General.   BRIEF HISTORY:  A 65 year old female with a long history of having  significant end-stage degenerative joint disease of the left knee.  The  patient has been treated with anti-inflammatory medications and arthroscopic  debridement, and because of continuing complaints of pain and failure of all  conservative care, the patient is taken to the operating room for total knee  replacement.   PROCEDURE:  The patient was taken to the operating room and after adequate  anesthesia was obtained with general anesthetic, the patient was placed  supine on the operating table.  The left leg was prepped and draped in the  usual sterile fashion.  Following this, the leg was exsanguinated.  The  blood pressure tourniquet was inflated to 350 mmHg.  Following this, a  midline incision was made through the subcutaneous tissues and taken down to  the level of the extensor mechanism, which was clearly identified and  divided in a medial parapatellar arthrotomy.  A medial and lateral  meniscectomy was then performed as well as anterior and posterior cruciate  excision.  Attention was turned to the tibia, which was cut perpendicular to  its long axis.  Attention was then turned to the  femur, which after  intramedullary piloting, the femur was cut perpendicular to its long axis.  At this point, a __________ was placed for a 10-mm bearing and excellent  full extension was achieved.  Attention was then turned to sizing, we sized  to a size 3, anterior and posterior cuts were made as well as chamfers, a  box was then cut, and the trial femur was put in place.  Attention was  turned to the tibia, which was sized for a size 3.  It was fitted and the  keel was then punched after it was drilled centrally.  At this time, a trial  tibia and trial femur were put in place, a 10 mm bridging bearing, and  excellent full extension was achieved.  The patella was cut free-hand to a  level of 14 mm.  A 35 mm all-polyethylene patella was chosen and put in  place.  At this point, the trial components were removed.  The knee was  copiously irrigated and suctioned dry.  The final components were then  cemented into place after the knee had been copiously lavaged  and suctioned  dry.  Once the final components were cemented into place, a size 3 femur, a  size 3, a 10 mm bridging bearing, and a 35 mm all-polyethylene patella, all  excess cement was removed after drying and the medium Hemovac drain was  placed.  The median parapatellar arthrotomy was then closed.  Excellent  range of motion was achieved with no tendency towards instability.  After  closure of the median parapatellar arthrotomy, the  skin was closed with 0 and 2-0 Vicryl and skin staples.  A sterile  compressive dressing was applied as well as a knee immobilizer.  The patient  was taken to the recovery room where she was noted to be in satisfactory  condition.  Estimated blood loss for the procedure was none.      JLG/MEDQ  D:  10/31/2004  T:  11/01/2004  Job:  [id number]

## 2011-01-09 NOTE — H&P (Signed)
NAME:  [name], [name]                        ACCOUNT NO.:  [id number]   MEDICAL RECORD NO.:  [id number]                   PATIENT TYPE:  INP   LOCATION:  2041                                 FACILITY:  [facility]   PHYSICIAN:  Peter M. Swaziland, M.D.               DATE OF BIRTH:  [birth year]   DATE OF ADMISSION:  05/07/2002  DATE OF DISCHARGE:  05/08/2002                                HISTORY & PHYSICAL   HISTORY OF PRESENT ILLNESS:  The patient is a 65 year old [place] female with  history of hypertension, obesity, and hypercholesterolemia who presents to  the emergency room today for evaluation of chest pain.  She describes  chronic soreness in her anterior chest.  This morning she developed a sharp  left precordial chest pain beneath her left breast.  There was no associated  shortness of breath, nausea, vomiting, or diaphoresis.  She had some  discomfort in the third to fifth digits on her left hand.  This was relieved  with rest.  However, she noticed when she tried to walk later in the day the  pain recurred and she presented to the emergency room.  She has no prior  history of myocardial infarction.   PAST MEDICAL HISTORY:  Question a history of mitral valve prolapse by  echocardiogram six months ago, history of hypertension,  hypercholesterolemia, obesity, and arthritis.  She is status post spinal  surgery by Dr. Channing Mutters.  She has had previous rotator cuff surgery by Dr.  Thomasena Edis and had previous hysterectomy.   MEDICATIONS:  1. Omeprazole 20 mg q.d.  2. Feldene 10-20 mg q.d.  3. HCTZ 25 mg q.d.  4. Enalapril 10 mg b.i.d.  5. Mevacor 40 mg q.d.  6. Atenolol 50 mg q.d.  7. Hydrocodone two tablets q.h.s. p.r.n.   ALLERGIES:  She has no known allergies.   SOCIAL HISTORY:  The patient is married.  She has three children.  She is a  nonsmoker, nondrinker.   FAMILY HISTORY:  Mother has had coronary disease and is status post  stenting.   REVIEW OF SYMPTOMS:  Remarkable for  occasional trembling and shaking.  Otherwise, review of systems is negative.   PHYSICAL EXAMINATION:  GENERAL:  The patient is an obese [place]inzer female in no  apparent distress.  VITAL SIGNS:  Blood pressure 142/80, pulse 90 and regular, respirations 20.  She is afebrile.  HEENT:  Unremarkable.  Pupils are equal, round, and reactive.  Conjunctivae  are clear.  Oropharynx is clear.  NECK:  Without JVD, adenopathy, thyromegaly, or bruits.  LUNGS:  Clear to auscultation and percussion.  CARDIAC:  Regular rate and rhythm.  Normal S1 and S2 without gallops,  murmurs, rubs, or clicks.  ABDOMEN:  Soft, very obese without masses or bruits.  CHEST:  Wall does demonstrate significant tenderness to palpation in the  left parasternal region.  NEUROLOGIC:  Intact.  EXTREMITIES:  Very obese,  soft.  Pulses are 2+ and symmetric.  There is no  significant edema.   LABORATORY DATA:  Chest x-ray shows no active disease.  ECG shows normal  sinus rhythm with nonspecific ST abnormality.  CK 140 with 2.3 MB, troponin  0.02.  Lahm count 6900, hemoglobin 11.6, hematocrit 35.6, platelets  280,000.  Sodium 137, potassium 3.3, chloride 103, CO2 28, BUN 14,  creatinine 1.1, glucose 101.   IMPRESSION:  1. Chest pain with atypical features.  Her examination suggests     musculoskeletal pain.  However, need to rule out myocardial infarction.     The patient with multiple cardiac risk factors.  2. Hypertension.  3. Hypercholesterolemia.  4. Obesity.  5. Arthritis.   PLAN:  The patient will be admitted to telemetry.  Rule out myocardial  infarction.  Will add aspirin and p.r.n. nitrates.  Will continue her home  medications.  If cardiac enzymes are negative would consider for stress  Cardiolite study.                                               Peter M. [place]iland, M.D.    PMJ/MEDQ  D:  05/07/2002  T:  05/08/2002  Job:  [id number]   cc:   Madelon Lips, M.D.

## 2011-01-09 NOTE — Op Note (Signed)
NAMEELOISA, CHOKSHI              ACCOUNT NO.:  [id number]   MEDICAL RECORD NO.:  [id number]          PATIENT TYPE:  INP   LOCATION:  3009                         FACILITY:  [facility]   PHYSICIAN:  Payton Doughty, M.D.      DATE OF BIRTH:  1946/07/10   DATE OF PROCEDURE:  05/18/2006  DATE OF DISCHARGE:                                 OPERATIVE REPORT   PREOPERATIVE DIAGNOSIS:  Spondylosis, L2-3.   POSTOPERATIVE DIAGNOSIS:  Spondylosis L2-3.   PROCEDURE:  L2-3 laminectomy, diskectomy, posterior __________ fusion cages,  and posterolateral arthrodesis with OP1.   __________.   ANESTHESIA:  General endotracheal.   PREPARATION:  Standard prep and scrub with alcohol wipe.   COMPLICATION:  None.   ASSISTANTS:  [place].   BODY OF TEXT:  A 65 year old girl with recurrent disk at L2-3, taken to the  operating room, smoothly anesthetized and intubated, placed prone on the  operating table.  Following shave, prep, and drape in the usual sterile  fashion, the skin was incised from the bottom of L1 to the top of L3.  The  lamina of L2 was exposed bilaterally in the subperiosteal plane along with  the transverse processes of L2 and L3.  Intraoperative x-ray confirmed  correct level of the par interarticularis.  Remaining lamina and inferior  facet of L2 and superior facet of L3 were removed bilaterally using the high-  speed drill.  On the left side and the site of her prior operation, the pars  interarticularis was fractured.  In the inferior facet was a loose fragment.  Following complete removal of the posterior elements and the ligamentum  flavum, the floor of the spinal canal was explored.  There was a very small  central disk directly in the midline which extended up and down, above and  below the disk space.  This obstructs the posterior longitudinal ligament.  Removal of this resulted in decompression.  A 12 x 21 mm Ray Threaded fusion  cages are placed bilaterally.  Intraoperative x-ray  showed good placement of  cages.  They were packed with bone graft harvested from the facet joints.  The transverse processes of L2 and L3 were decorticated, and BMP as OP1 the  extender matrix Actifuse were placed bilaterally.  The subcutaneous tissue  and fascia were reapproximated with 0 Vicryl in interrupted fashion.  The  subcuticular tissue was reapproximated with 0 Vicryl in interrupted fashion.  The skin was closed with 0 nylon in a ringlike fashion.  Betadine Telfa  dressing was applied __________ with  OpSite and the patient returned to the  recovery room in good condition.          ______________________________  Payton Doughty, M.D.    MWR/MEDQ  D:  05/18/2006  T:  05/18/2006  Job:  [id number]

## 2011-01-09 NOTE — Op Note (Signed)
NAMESTEPHAINE, [name]              ACCOUNT NO.:  [id number]   MEDICAL RECORD NO.:  [id number]          PATIENT TYPE:  OIB   LOCATION:  2895                         FACILITY:  [facility]   PHYSICIAN:  Harvie Junior, M.D.   DATE OF BIRTH:  05/04/1946   DATE OF PROCEDURE:  06/06/2004  DATE OF DISCHARGE:                                 OPERATIVE REPORT   PREOPERATIVE DIAGNOSIS:  1.  Left knee degenerative change with suspected meniscal tear.  2.  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   POSTOPERATIVE DIAGNOSIS:  1.  Left knee degenerative change.  2.  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   PROCEDURE:  1.  Chondroplasty patellofemoral joint.  2.  Chondroplasty lateral femoral condyle.  3.  Chondroplasty medial femoral condyle.   PROCEDURE:  1.  Hip arthroscopy with extracapsular debridement of trochanteric bursa.  2.  Release of iliotibial band.  3.  Reduction of lateral trochanter.   SURGEON:  Harvie Junior, M.D.   ASSISTANT:  Marshia Ly, P.A.   ANESTHESIA:  General.   BRIEF HISTORY:  This is a 65 year old female with a long history of having  significant left knee pain as well as bilateral hip pain.  She has undergone  arthroscopic trochanteric release on the left side and she presented for  this on the right side as well as left knee arthroscopy.   DESCRIPTION OF PROCEDURE:  The patient was taken to the operating room and  after adequate anesthesia was obtained with general anesthesia, the patient  was placed on the operating table, the left leg was prepped and draped in  the usual sterile fashion.  Following this, routine arthroscopic examination  of the knee revealed there was obvious significant grade 3 change and grade  4 change on the medial femoral condyle.  This was debrided with a shaver  back to a smooth and  stable rim.  The ACL was normal.  Attention was turned  laterally where there was a focal grade 4 deficit over the lateral femoral  condyle.  The lateral meniscus was within normal limits as well as the  medial meniscus.  Attention was turned to the patellofemoral joint where  grade 3 changes existed throughout the patellofemoral joint.  This was  debrided with a suction shaver back to a smooth, stable rim.  Attention was  turned towards suctioning the knee dry and closing the knee portals with  antibiotic gauze and a sterile compressive dressing was applied.   The patient had the drapes removed.  A separate prep and drape was  undertaken.  The patient was moved into the left lateral decubitus position  and the right hip was prepped.  An axillary roll was put in place at this  point.  The right leg was flexed slightly and abducted to allow access to  the iliotibial band and underneath.  At this point, an incision was made  proximally as well as distally and the camera was  placed underneath the  iliotibial band as well as an incision being made distally with the cannulae  being placed underneath the iliotibial band.  The IT band was identified  from the under surface and released with the arthroscopic Bovie.  Excellent  release could be palpated with fingers in the portals.  At this point,  attention was turned to the greater trochanter where trochanteric reduction  was undertaken with a motorized bur and following this, attention was turned  towards the trochanteric bursa where a subtotal bursectomy was performed  both anterior and posterior to the greater trochanter.  At this point, the  wound was copiously irrigated and suctioned dry.  The arthroscopic portals  were closed with a stitch.  A sterile compressive dressing was applied and  the patient was taken to the recovery room where she was noted to be in  satisfactory condition.  Estimated blood loss for both procedures was  none.       JLG/MEDQ  D:  06/06/2004  T:  06/06/2004  Job:  [id number]

## 2011-01-21 ENCOUNTER — Ambulatory Visit (HOSPITAL_COMMUNITY)
Admission: RE | Admit: 2011-01-21 | Discharge: 2011-01-21 | Disposition: A | Discharge status: Home / Self Care | Payer: Medicare Other | Source: Ambulatory Visit | Attending: Orthopedic Surgery | Admitting: Orthopedic Surgery

## 2011-01-21 ENCOUNTER — Other Ambulatory Visit (HOSPITAL_COMMUNITY): Payer: Self-pay | Admitting: Orthopedic Surgery

## 2011-01-21 ENCOUNTER — Telehealth: Payer: Self-pay | Admitting: Cardiology

## 2011-01-21 ENCOUNTER — Encounter (HOSPITAL_COMMUNITY)
Admission: RE | Admit: 2011-01-21 | Discharge: 2011-01-21 | Disposition: A | Discharge status: Home / Self Care | Payer: Medicare Other | Source: Ambulatory Visit | Attending: Orthopedic Surgery | Admitting: Orthopedic Surgery

## 2011-01-21 DIAGNOSIS — Z01812 Encounter for preprocedural laboratory examination: Secondary | ICD-10-CM | POA: Insufficient documentation

## 2011-01-21 DIAGNOSIS — Z01810 Encounter for preprocedural cardiovascular examination: Secondary | ICD-10-CM | POA: Insufficient documentation

## 2011-01-21 DIAGNOSIS — Z01818 Encounter for other preprocedural examination: Secondary | ICD-10-CM | POA: Insufficient documentation

## 2011-01-21 DIAGNOSIS — M67919 Unspecified disorder of synovium and tendon, unspecified shoulder: Secondary | ICD-10-CM | POA: Insufficient documentation

## 2011-01-21 DIAGNOSIS — M75102 Unspecified rotator cuff tear or rupture of left shoulder, not specified as traumatic: Secondary | ICD-10-CM

## 2011-01-21 DIAGNOSIS — M719 Bursopathy, unspecified: Secondary | ICD-10-CM | POA: Insufficient documentation

## 2011-01-21 LAB — BASIC METABOLIC PANEL
BUN: 20 mg/dL (ref 6–23)
CO2: 35 mEq/L — ABNORMAL HIGH (ref 19–32)
Calcium: 10.2 mg/dL (ref 8.4–10.5)
Chloride: 98 mEq/L (ref 96–112)
GFR calc Af Amer: 57 mL/min — ABNORMAL LOW (ref >60)
GFR calc non Af Amer: 47 mL/min — ABNORMAL LOW (ref >60)
Glucose, Bld: 85 mg/dL (ref 70–99)
Potassium: 3.9 mEq/L (ref 3.5–5.1)
Sodium: 139 mEq/L (ref 135–145)

## 2011-01-21 LAB — CBC
HCT: 37 % (ref 36.0–46.0)
MCV: 87.3 fL (ref 78.0–100.0)
RDW: 15.3 % (ref 11.5–15.5)
WBC: 6.3 10*3/uL (ref 4.0–10.5)

## 2011-01-21 LAB — SURGICAL PCR SCREEN
MRSA, PCR: NEGATIVE
Staphylococcus aureus: NEGATIVE

## 2011-01-21 NOTE — Telephone Encounter (Signed)
Fax: [phone] Echo, stress, ov

## 2011-01-23 HISTORY — PX: SHOULDER OPEN ROTATOR CUFF REPAIR: SHX2407

## 2011-01-26 ENCOUNTER — Ambulatory Visit (HOSPITAL_COMMUNITY)
Admission: RE | Admit: 2011-01-26 | Discharge: 2011-01-26 | Disposition: A | Discharge status: Home / Self Care | Payer: Medicare Other | Source: Ambulatory Visit | Attending: Orthopedic Surgery | Admitting: Orthopedic Surgery

## 2011-01-26 DIAGNOSIS — M25819 Other specified joint disorders, unspecified shoulder: Secondary | ICD-10-CM | POA: Insufficient documentation

## 2011-01-26 DIAGNOSIS — M719 Bursopathy, unspecified: Secondary | ICD-10-CM | POA: Insufficient documentation

## 2011-01-26 DIAGNOSIS — M19019 Primary osteoarthritis, unspecified shoulder: Secondary | ICD-10-CM | POA: Insufficient documentation

## 2011-01-26 DIAGNOSIS — M67919 Unspecified disorder of synovium and tendon, unspecified shoulder: Secondary | ICD-10-CM | POA: Insufficient documentation

## 2011-01-26 DIAGNOSIS — M24119 Other articular cartilage disorders, unspecified shoulder: Secondary | ICD-10-CM | POA: Insufficient documentation

## 2011-02-09 NOTE — Op Note (Signed)
  NAMEVALORY, [name]              ACCOUNT NO.:  [id number]  MEDICAL RECORD NO.:  [id number]  LOCATION:  SDSC                         FACILITY:  [facility]  PHYSICIAN:  Dyke Brackett, M.D.    DATE OF BIRTH:  [birth year]  DATE OF PROCEDURE:  01/26/2011 DATE OF DISCHARGE:  01/26/2011                              OPERATIVE REPORT   INDICATIONS:  This is a 65 year old with MRI proven cuff tear left thought to be amenable outpatient possible overnight hospitalization due to medical history.  PREOPERATIVE DIAGNOSES: 1. Complete tear supraspinatus. 2. Impingement. 3. Acromioclavicular joint arthritis. 4. Degenerate tear anterior-superior labrum.  POSTOPERATIVE DIAGNOSES: 1. Complete tear supraspinatus. 2. Impingement. 3. Acromioclavicular joint arthritis. 4. Degenerate tear anterior-superior labrum.  OPERATION: 1. Open rotator cuff tear with acromioplasty, chronic. 2. Labral debridement (arthroscopic, extensive ). 3. Distal clavicle excision (open), all to the left shoulder.  SURGEON:  Dyke Brackett, MD  General anesthetic with a nerve block.  PROCEDURE:  She was arthroscoped in the beach-chair position.  We on posterior and anterior portals discovered a degenerate tear of the anterior-superior labrum extension.  The labrum was debrided separate from the remainder of the procedure with full-thickness tear of the supraspinatus with minimal retraction was noted with some degenerative change within the horizontal component of the cuff.  We decided to do an open procedure with incision bisecting the acromial AC interval.  We did acromioplasty, distal clavicle excision of about a centimeter and a half of the edges of the cuff tear with a 15 blade, worked on the mild amount of retraction that we had, and we burred the superior tuberosity with a bur followed by placing two 5 x 5 Arthrex anchors with total of four more sutures that we oversewed an edge with running and interrupted #2  FiberWire to stitch a watertight repair.  Closure was then affected on the deltoid with #1 Ethibond with 2-0 Vicryl, Monocryl in the skin, and Steri-Strips eventually applied with a lightly compressive sterile dressing with sling, taken to recovery room in stable condition.     Dyke Brackett, M.D.     WDC/MEDQ  D:  01/26/2011  T:  01/26/2011  Job:  [id number]  Electronically Signed by W. Azzan Butler M.D. on 02/09/2011 01:06:46 PM

## 2011-02-24 ENCOUNTER — Ambulatory Visit: Payer: Medicare Other | Attending: Orthopedic Surgery | Admitting: Physical Therapy

## 2011-02-24 DIAGNOSIS — R5381 Other malaise: Secondary | ICD-10-CM | POA: Insufficient documentation

## 2011-02-24 DIAGNOSIS — IMO0001 Reserved for inherently not codable concepts without codable children: Secondary | ICD-10-CM | POA: Insufficient documentation

## 2011-02-24 DIAGNOSIS — M25519 Pain in unspecified shoulder: Secondary | ICD-10-CM | POA: Insufficient documentation

## 2011-03-03 ENCOUNTER — Ambulatory Visit: Payer: Medicare Other | Admitting: Physical Therapy

## 2011-03-10 ENCOUNTER — Ambulatory Visit: Payer: Medicare Other | Admitting: Physical Therapy

## 2011-03-11 ENCOUNTER — Ambulatory Visit: Payer: Medicare Other | Admitting: Physical Therapy

## 2011-03-17 ENCOUNTER — Ambulatory Visit: Payer: Medicare Other | Admitting: Physical Therapy

## 2011-03-19 ENCOUNTER — Ambulatory Visit: Payer: Medicare Other | Admitting: Physical Therapy

## 2011-03-24 ENCOUNTER — Ambulatory Visit: Payer: Medicare Other | Attending: Orthopedic Surgery | Admitting: Physical Therapy

## 2011-03-24 DIAGNOSIS — M25519 Pain in unspecified shoulder: Secondary | ICD-10-CM | POA: Insufficient documentation

## 2011-03-24 DIAGNOSIS — R5381 Other malaise: Secondary | ICD-10-CM | POA: Insufficient documentation

## 2011-03-24 DIAGNOSIS — IMO0001 Reserved for inherently not codable concepts without codable children: Secondary | ICD-10-CM | POA: Insufficient documentation

## 2011-03-24 DIAGNOSIS — M6281 Muscle weakness (generalized): Secondary | ICD-10-CM | POA: Insufficient documentation

## 2011-03-26 ENCOUNTER — Ambulatory Visit: Payer: Medicare Other | Attending: Orthopedic Surgery | Admitting: Physical Therapy

## 2011-03-26 DIAGNOSIS — R5381 Other malaise: Secondary | ICD-10-CM | POA: Insufficient documentation

## 2011-03-26 DIAGNOSIS — M25519 Pain in unspecified shoulder: Secondary | ICD-10-CM | POA: Insufficient documentation

## 2011-03-26 DIAGNOSIS — IMO0001 Reserved for inherently not codable concepts without codable children: Secondary | ICD-10-CM | POA: Insufficient documentation

## 2011-03-26 DIAGNOSIS — M6281 Muscle weakness (generalized): Secondary | ICD-10-CM | POA: Insufficient documentation

## 2011-03-31 ENCOUNTER — Ambulatory Visit: Payer: Medicare Other | Admitting: Physical Therapy

## 2011-04-02 ENCOUNTER — Ambulatory Visit: Payer: Medicare Other | Admitting: Physical Therapy

## 2011-04-07 ENCOUNTER — Ambulatory Visit: Payer: Medicare Other | Admitting: Physical Therapy

## 2011-04-09 ENCOUNTER — Ambulatory Visit: Payer: Medicare Other | Admitting: Physical Therapy

## 2011-04-13 ENCOUNTER — Encounter: Payer: Medicare Other | Admitting: Physical Therapy

## 2011-04-15 ENCOUNTER — Ambulatory Visit: Payer: Medicare Other | Admitting: Physical Therapy

## 2011-04-16 ENCOUNTER — Ambulatory Visit: Payer: Medicare Other | Admitting: Physical Therapy

## 2011-04-21 ENCOUNTER — Ambulatory Visit: Payer: Medicare Other | Admitting: Physical Therapy

## 2011-04-23 ENCOUNTER — Ambulatory Visit: Payer: Medicare Other | Admitting: Physical Therapy

## 2011-04-28 ENCOUNTER — Ambulatory Visit: Payer: Medicare Other | Attending: Orthopedic Surgery | Admitting: Physical Therapy

## 2011-04-28 DIAGNOSIS — M6281 Muscle weakness (generalized): Secondary | ICD-10-CM | POA: Insufficient documentation

## 2011-04-28 DIAGNOSIS — M25519 Pain in unspecified shoulder: Secondary | ICD-10-CM | POA: Insufficient documentation

## 2011-04-28 DIAGNOSIS — IMO0001 Reserved for inherently not codable concepts without codable children: Secondary | ICD-10-CM | POA: Insufficient documentation

## 2011-04-28 DIAGNOSIS — R5381 Other malaise: Secondary | ICD-10-CM | POA: Insufficient documentation

## 2011-04-30 ENCOUNTER — Ambulatory Visit: Payer: Medicare Other | Admitting: Physical Therapy

## 2011-05-05 ENCOUNTER — Ambulatory Visit: Payer: Medicare Other | Admitting: *Deleted

## 2011-05-07 ENCOUNTER — Ambulatory Visit: Payer: Medicare Other | Admitting: Physical Therapy

## 2011-05-12 ENCOUNTER — Ambulatory Visit: Payer: Medicare Other | Admitting: Physical Therapy

## 2011-05-14 ENCOUNTER — Ambulatory Visit: Payer: Medicare Other | Admitting: Physical Therapy

## 2011-05-19 ENCOUNTER — Ambulatory Visit: Payer: Medicare Other | Admitting: *Deleted

## 2011-05-21 ENCOUNTER — Ambulatory Visit: Payer: Medicare Other | Admitting: Physical Therapy

## 2011-05-28 ENCOUNTER — Encounter: Payer: Medicare Other | Admitting: Physical Therapy

## 2011-06-03 ENCOUNTER — Ambulatory Visit: Payer: Medicare Other | Attending: Orthopedic Surgery | Admitting: Physical Therapy

## 2011-06-03 DIAGNOSIS — IMO0001 Reserved for inherently not codable concepts without codable children: Secondary | ICD-10-CM | POA: Insufficient documentation

## 2011-06-03 DIAGNOSIS — M6281 Muscle weakness (generalized): Secondary | ICD-10-CM | POA: Insufficient documentation

## 2011-06-03 DIAGNOSIS — M25519 Pain in unspecified shoulder: Secondary | ICD-10-CM | POA: Insufficient documentation

## 2011-06-03 DIAGNOSIS — R5381 Other malaise: Secondary | ICD-10-CM | POA: Insufficient documentation

## 2011-06-05 ENCOUNTER — Ambulatory Visit: Payer: Medicare Other | Admitting: Physical Therapy

## 2011-06-09 ENCOUNTER — Ambulatory Visit: Payer: Medicare Other | Admitting: Physical Therapy

## 2011-06-11 ENCOUNTER — Ambulatory Visit: Payer: Medicare Other | Admitting: Physical Therapy

## 2011-06-16 ENCOUNTER — Ambulatory Visit: Payer: Medicare Other | Admitting: Physical Therapy

## 2011-06-18 ENCOUNTER — Ambulatory Visit: Payer: Medicare Other | Admitting: Physical Therapy

## 2011-08-24 ENCOUNTER — Other Ambulatory Visit: Payer: Self-pay | Admitting: Orthopedic Surgery

## 2011-08-24 DIAGNOSIS — M79671 Pain in right foot: Secondary | ICD-10-CM

## 2011-08-26 ENCOUNTER — Inpatient Hospital Stay: Admission: RE | Admit: 2011-08-26 | Payer: Medicare Other | Source: Ambulatory Visit

## 2011-08-28 ENCOUNTER — Ambulatory Visit
Admission: RE | Admit: 2011-08-28 | Discharge: 2011-08-28 | Disposition: A | Discharge status: Home / Self Care | Payer: Medicare Other | Source: Ambulatory Visit | Attending: Orthopedic Surgery | Admitting: Orthopedic Surgery

## 2011-08-28 DIAGNOSIS — M79671 Pain in right foot: Secondary | ICD-10-CM

## 2011-08-28 MED ORDER — TRIAMCINOLONE ACETONIDE 40 MG/ML IJ SUSP (RADIOLOGY): 20.0000 mg | Freq: Once | INTRAMUSCULAR | Status: DC

## 2011-08-28 MED ORDER — IOHEXOL 180 MG/ML  SOLN: 1.0000 mL | Freq: Once | INTRAMUSCULAR | Status: AC | PRN

## 2011-08-31 ENCOUNTER — Other Ambulatory Visit: Payer: Medicare Other

## 2011-12-10 ENCOUNTER — Ambulatory Visit: Payer: Medicare Other | Attending: Orthopedic Surgery | Admitting: Physical Therapy

## 2011-12-10 DIAGNOSIS — M25569 Pain in unspecified knee: Secondary | ICD-10-CM | POA: Insufficient documentation

## 2011-12-10 DIAGNOSIS — R5381 Other malaise: Secondary | ICD-10-CM | POA: Insufficient documentation

## 2011-12-10 DIAGNOSIS — IMO0001 Reserved for inherently not codable concepts without codable children: Secondary | ICD-10-CM | POA: Insufficient documentation

## 2011-12-10 DIAGNOSIS — M25519 Pain in unspecified shoulder: Secondary | ICD-10-CM | POA: Insufficient documentation

## 2011-12-14 ENCOUNTER — Ambulatory Visit: Payer: Medicare Other | Admitting: Physical Therapy

## 2011-12-17 ENCOUNTER — Ambulatory Visit: Payer: Medicare Other | Admitting: *Deleted

## 2011-12-22 ENCOUNTER — Ambulatory Visit: Payer: Medicare Other | Admitting: Physical Therapy

## 2011-12-24 ENCOUNTER — Ambulatory Visit: Payer: Medicare Other | Attending: Orthopedic Surgery | Admitting: *Deleted

## 2011-12-24 DIAGNOSIS — IMO0001 Reserved for inherently not codable concepts without codable children: Secondary | ICD-10-CM | POA: Insufficient documentation

## 2011-12-24 DIAGNOSIS — R5381 Other malaise: Secondary | ICD-10-CM | POA: Insufficient documentation

## 2011-12-24 DIAGNOSIS — M25519 Pain in unspecified shoulder: Secondary | ICD-10-CM | POA: Insufficient documentation

## 2011-12-24 DIAGNOSIS — M25569 Pain in unspecified knee: Secondary | ICD-10-CM | POA: Insufficient documentation

## 2011-12-29 ENCOUNTER — Ambulatory Visit: Payer: Medicare Other | Admitting: *Deleted

## 2011-12-31 ENCOUNTER — Ambulatory Visit: Payer: Medicare Other | Admitting: *Deleted

## 2012-01-05 ENCOUNTER — Ambulatory Visit: Payer: Medicare Other | Admitting: Physical Therapy

## 2012-01-06 ENCOUNTER — Other Ambulatory Visit: Payer: Self-pay | Admitting: Family Medicine

## 2012-01-06 DIAGNOSIS — Z1231 Encounter for screening mammogram for malignant neoplasm of breast: Secondary | ICD-10-CM

## 2012-01-07 ENCOUNTER — Encounter: Payer: Medicare Other | Admitting: *Deleted

## 2012-01-28 ENCOUNTER — Ambulatory Visit: Payer: Medicare Other

## 2012-02-09 ENCOUNTER — Ambulatory Visit: Payer: Medicare Other

## 2012-02-16 ENCOUNTER — Ambulatory Visit
Admission: RE | Admit: 2012-02-16 | Discharge: 2012-02-16 | Disposition: A | Discharge status: Home / Self Care | Payer: Medicare Other | Source: Ambulatory Visit | Attending: Family Medicine | Admitting: Family Medicine

## 2012-02-16 DIAGNOSIS — Z1231 Encounter for screening mammogram for malignant neoplasm of breast: Secondary | ICD-10-CM

## 2012-04-18 ENCOUNTER — Other Ambulatory Visit: Payer: Self-pay | Admitting: Orthopedic Surgery

## 2012-04-18 DIAGNOSIS — M79671 Pain in right foot: Secondary | ICD-10-CM

## 2012-04-21 ENCOUNTER — Other Ambulatory Visit: Payer: Medicare Other

## 2012-04-21 ENCOUNTER — Ambulatory Visit
Admission: RE | Admit: 2012-04-21 | Discharge: 2012-04-21 | Disposition: A | Discharge status: Home / Self Care | Payer: Medicare Other | Source: Ambulatory Visit | Attending: Orthopedic Surgery | Admitting: Orthopedic Surgery

## 2012-04-21 DIAGNOSIS — M79671 Pain in right foot: Secondary | ICD-10-CM

## 2012-04-21 MED ORDER — TRIAMCINOLONE ACETONIDE 40 MG/ML IJ SUSP (RADIOLOGY)
20.0000 mg | Freq: Once | INTRAMUSCULAR | Status: AC
  Administered 2012-04-21: 20 mg via INTRA_ARTICULAR

## 2012-04-21 MED ORDER — IOHEXOL 180 MG/ML  SOLN
1.0000 mL | Freq: Once | INTRAMUSCULAR | Status: AC | PRN
  Administered 2012-04-21: 1 mL via INTRA_ARTICULAR

## 2012-05-12 ENCOUNTER — Encounter: Payer: Self-pay | Admitting: Cardiology

## 2012-05-17 ENCOUNTER — Ambulatory Visit (INDEPENDENT_AMBULATORY_CARE_PROVIDER_SITE_OTHER): Payer: Medicare Other | Admitting: Cardiology

## 2012-05-17 ENCOUNTER — Encounter: Payer: Self-pay | Admitting: Cardiology

## 2012-05-17 VITALS — BP 132/70 | HR 57 | Ht 68.0 in | Wt 226.8 lb

## 2012-05-17 DIAGNOSIS — R635 Abnormal weight gain: Secondary | ICD-10-CM | POA: Insufficient documentation

## 2012-05-17 DIAGNOSIS — R0609 Other forms of dyspnea: Secondary | ICD-10-CM

## 2012-05-17 DIAGNOSIS — R609 Edema, unspecified: Secondary | ICD-10-CM

## 2012-05-17 DIAGNOSIS — I890 Lymphedema, not elsewhere classified: Secondary | ICD-10-CM | POA: Insufficient documentation

## 2012-05-17 DIAGNOSIS — R0600 Dyspnea, unspecified: Secondary | ICD-10-CM | POA: Insufficient documentation

## 2012-05-17 DIAGNOSIS — I10 Essential (primary) hypertension: Secondary | ICD-10-CM | POA: Insufficient documentation

## 2012-05-17 DIAGNOSIS — R0989 Other specified symptoms and signs involving the circulatory and respiratory systems: Secondary | ICD-10-CM

## 2012-05-17 NOTE — Patient Instructions (Signed)
We will schedule you for an Echocardiogram  Continue your current medications

## 2012-05-17 NOTE — Progress Notes (Signed)
[name] Date of Birth: [birth year] Medical Record #[id number]  History of Present Illness: [name] is seen today in at the request of Dr. Lysbeth Galas for evaluation of dyspnea. She is a 66 year old [name] female who has a prior history of chest pain. She had extensive cardiac evaluation including echocardiograms and stress Myoview studies which were normal. A cardiac catheterization in May of 2009 was normal. She also has a history of recurrent syncope with extensive negative evaluation as noted before in addition to Holter monitors and a tilt table test. She reports that recently she has experienced increased shortness of breath and some increase lower extremity swelling. She was treated for acid reflux and this did result in significant improvement in her hoarseness and shortness of breath. She went off of this therapy for a time until her symptoms recurred recently. Since she is back on therapy her shortness of breath has cleared up. She complains of feeling swollen in her mid section to the point that she has to take her bra off late in the day. She states she has always had swollen legs. She has had a recent chest x-ray which was normal. She denies any orthopnea or PND. Recent BNP level was 35.  Current Outpatient Prescriptions on File Prior to Visit  Medication Sig Dispense Refill  . amLODipine (NORVASC) 5 MG tablet Take by mouth Daily.      Marland Kitchen dexlansoprazole (DEXILANT) 60 MG capsule Take 60 mg by mouth as directed.      Marland Kitchen FLUoxetine (PROZAC) 20 MG capsule Take 20 mg by mouth Every morning.      . furosemide (LASIX) 40 MG tablet Take 40 mg by mouth daily.      Marland Kitchen levothyroxine (SYNTHROID, LEVOTHROID) 50 MCG tablet Take 50 mcg by mouth as directed.      . potassium chloride SA (K-DUR,KLOR-CON) 20 MEQ tablet Take 20 mEq by mouth Twice daily.      Marland Kitchen spironolactone (ALDACTONE) 25 MG tablet Take 25 mg by mouth Twice daily.        No Known Allergies  Past Medical History  Diagnosis Date  .  Rotator cuff injury   . Fatigue   . Edema   . H/O: hysterectomy   . Hyperlipidemia   . Depression   . Anxiety   . Syncope   . Chest pain     Normal cardiac cath 5/09  . Dumping syndrome     Past Surgical History  Procedure Date  . Rotator cuff repair   . Total knee arthroplasty     left  . Appendectomy   . Back surgery     x3  . Total abdominal hysterectomy     History  Smoking status  . Never Smoker   Smokeless tobacco  . Not on file    History  Alcohol Use No    Family History  Problem Relation Age of Onset  . Heart disease Mother   . Heart failure Mother   . Cancer Father   . Cancer Brother   . Cancer Brother     Review of Systems: The review of systems is positive for hoarseness. She reports she has gained about 5 pounds. All other systems were reviewed and are negative.  Physical Exam: BP 132/70  Pulse 57  Ht 5\' 8"  (1.727 m)  Wt 226 lb 12.8 oz (102.876 kg)  BMI 34.48 kg/m2  SpO2 97% She is a morbidly obese [place] female in no acute distress. Her HEENT exam is  unremarkable. She has no jugular venous distention or bruits. There is no adenopathy or thyromegaly. Lungs are clear. Cardiac exam reveals a regular rate and rhythm without gallop, murmur, or click. Abdomen is morbidly obese, soft, nontender. There are no masses. No pets thyromegaly. Her lower extremities are morbidly obese without pitting edema. She is alert and oriented x3. Cranial nerves II through XII are intact. LABORATORY DATA: ECG today shows sinus bradycardia with a rate of 52 beats per minute. It is otherwise normal.  Assessment / Plan: 1. Dyspnea. I think it is unlikely that her symptoms are cardiac related. I think it is more likely that she is deconditioned and has restrictive lung disease related to her obesity. Previous chest x-ray was unremarkable. BNP level was low. We will obtain an echocardiogram. If this is unremarkable I think we can reassure her from a cardiac standpoint. She  needs to focus more weight loss and increased aerobic conditioning.  2. Prior history of chest pain now resolved. Extensive cardiac evaluation in the past including cardiac catheterization which was normal.  3. Prior history of syncope resolved. Again negative extensive evaluation.

## 2012-05-19 ENCOUNTER — Ambulatory Visit (HOSPITAL_COMMUNITY): Payer: Medicare Other | Attending: Cardiovascular Disease | Admitting: Radiology

## 2012-05-19 DIAGNOSIS — I10 Essential (primary) hypertension: Secondary | ICD-10-CM

## 2012-05-19 DIAGNOSIS — R609 Edema, unspecified: Secondary | ICD-10-CM

## 2012-05-19 DIAGNOSIS — R600 Localized edema: Secondary | ICD-10-CM

## 2012-05-19 DIAGNOSIS — R0602 Shortness of breath: Secondary | ICD-10-CM

## 2012-05-19 DIAGNOSIS — R0609 Other forms of dyspnea: Secondary | ICD-10-CM | POA: Insufficient documentation

## 2012-05-19 DIAGNOSIS — R0989 Other specified symptoms and signs involving the circulatory and respiratory systems: Secondary | ICD-10-CM | POA: Insufficient documentation

## 2012-05-19 DIAGNOSIS — I059 Rheumatic mitral valve disease, unspecified: Secondary | ICD-10-CM | POA: Insufficient documentation

## 2012-05-19 DIAGNOSIS — R635 Abnormal weight gain: Secondary | ICD-10-CM

## 2012-05-19 NOTE — Progress Notes (Signed)
Echocardiogram performed.  

## 2012-09-02 ENCOUNTER — Other Ambulatory Visit: Payer: Self-pay | Admitting: Orthopedic Surgery

## 2012-09-02 DIAGNOSIS — M19079 Primary osteoarthritis, unspecified ankle and foot: Secondary | ICD-10-CM

## 2012-09-08 ENCOUNTER — Ambulatory Visit
Admission: RE | Admit: 2012-09-08 | Discharge: 2012-09-08 | Disposition: A | Discharge status: Home / Self Care | Payer: Medicare Other | Source: Ambulatory Visit | Attending: Orthopedic Surgery | Admitting: Orthopedic Surgery

## 2012-09-08 DIAGNOSIS — M19079 Primary osteoarthritis, unspecified ankle and foot: Secondary | ICD-10-CM

## 2012-09-08 MED ORDER — TRIAMCINOLONE ACETONIDE 40 MG/ML IJ SUSP (RADIOLOGY)
20.0000 mg | Freq: Once | INTRAMUSCULAR | Status: AC
  Administered 2012-09-08: 20 mg via INTRA_ARTICULAR

## 2012-09-08 MED ORDER — IOHEXOL 180 MG/ML  SOLN
1.0000 mL | Freq: Once | INTRAMUSCULAR | Status: AC | PRN
  Administered 2012-09-08: 1 mL via INTRA_ARTICULAR

## 2012-09-21 ENCOUNTER — Other Ambulatory Visit: Payer: Self-pay

## 2012-09-21 DIAGNOSIS — G4700 Insomnia, unspecified: Secondary | ICD-10-CM

## 2012-09-26 ENCOUNTER — Ambulatory Visit: Payer: Medicare Other | Attending: Family Medicine | Admitting: Sleep Medicine

## 2012-09-26 DIAGNOSIS — G4710 Hypersomnia, unspecified: Secondary | ICD-10-CM | POA: Insufficient documentation

## 2012-09-26 DIAGNOSIS — G4730 Sleep apnea, unspecified: Secondary | ICD-10-CM | POA: Insufficient documentation

## 2012-09-26 DIAGNOSIS — Z6835 Body mass index (BMI) 35.0-35.9, adult: Secondary | ICD-10-CM | POA: Insufficient documentation

## 2012-09-26 DIAGNOSIS — G4700 Insomnia, unspecified: Secondary | ICD-10-CM

## 2012-10-02 NOTE — Procedures (Signed)
[facility] Raijon Lindfors A. Gerilyn Pilgrim, MD     [email or web address]        NAME:  [name], [name]              ACCOUNT NO.:  [id number]  MEDICAL RECORD NO.:  [id number]          PATIENT TYPE:  OUT  LOCATION:  SLEEP LAB                     FACILITY:  [facility]  PHYSICIAN:  Alicianna Litchford A. Gerilyn Pilgrim, M.D. DATE OF BIRTH:  Sep 04, 1945  DATE OF STUDY:  09/26/2012                           NOCTURNAL POLYSOMNOGRAM  REFERRING PHYSICIAN:  Delaney Meigs, M.D.  INDICATION:  This is a 67 year old, who presents with insomnia, fatigue, and snoring.  The study is being done to evaluate for obstructive sleep apnea syndrome.  MEDICATIONS:  Synthroid, vitamin B12, oxycodone, acetaminophen, Norvasc, hydrocodone, Ultram, alprazolam, Lipitor, Arthrotec, Xanax, zolpidem, Lasix, metolazone, potassium, Lasix, Demadex, omeprazole, levofloxacin, Prozac, spironolactone, penicillin, amlodipine, levofloxacin, gabapentin, hydroxyzine, methocarbamol, simvastatin.  EPWORTH SLEEPINESS SCALE:  5.  BMI:  35.  ARCHITECTURAL SUMMARY:  The total recording time is 440 minutes.  Sleep efficiency 71%.  Sleep latency 88 minutes.  REM latency 239 minutes. Stage N1 6.5%, N2 57% N3 13%, and REM sleep 11%.  RESPIRATORY SUMMARY:  Baseline oxygen saturation is 97, lowest saturation 87 during non-REM sleep.  Diagnostic AHI is 3 and RDI 4.  LIMB MOVEMENT SUMMARY:  PLM index 32.  ELECTROCARDIOGRAM SUMMARY:  Average heart rate is 52 with no significant dysrhythmias observed.  IMPRESSION:  Moderate to severe periodic limb movement disorder sleep. This could be indication of poor sleep and/or effects of medications such as SSRIs.  Thanks this referral.    Cleave Ternes A. Gerilyn Pilgrim, M.D.    KAD/MEDQ  D:  10/02/2012 17:54:02  T:  10/02/2012 18:20:28  Job:  [id number]

## 2012-11-04 ENCOUNTER — Encounter (HOSPITAL_COMMUNITY): Payer: Self-pay | Admitting: Pharmacy Technician

## 2012-11-08 ENCOUNTER — Encounter: Payer: Self-pay | Admitting: Diagnostic Neuroimaging

## 2012-11-08 ENCOUNTER — Ambulatory Visit (INDEPENDENT_AMBULATORY_CARE_PROVIDER_SITE_OTHER): Payer: Medicare Other | Admitting: Diagnostic Neuroimaging

## 2012-11-08 ENCOUNTER — Other Ambulatory Visit: Payer: Self-pay | Admitting: Physician Assistant

## 2012-11-08 VITALS — BP 143/73 | HR 74 | Temp 98.2°F | Ht 68.0 in | Wt 230.0 lb

## 2012-11-08 DIAGNOSIS — R531 Weakness: Secondary | ICD-10-CM

## 2012-11-08 DIAGNOSIS — R5381 Other malaise: Secondary | ICD-10-CM

## 2012-11-08 DIAGNOSIS — R253 Fasciculation: Secondary | ICD-10-CM

## 2012-11-08 DIAGNOSIS — R251 Tremor, unspecified: Secondary | ICD-10-CM

## 2012-11-08 DIAGNOSIS — R259 Unspecified abnormal involuntary movements: Secondary | ICD-10-CM

## 2012-11-08 NOTE — Progress Notes (Signed)
[facility]  REFERRING CLINICIAN: Nyland, Wilson HISTORY FROM: patient REASON FOR VISIT: new consult   HISTORICAL  CHIEF COMPLAINT: voice changes and tremors since past one year  HISTORY OF PRESENT ILLNESS: 67 year old right-handed female with history of hypertension, hypercholesterolemia, fibromyalgia, cervical spondylosis with myelopathy, lumbar spondylosis status post decompression, rotator cuff injury, left knee replacement, here for evaluation of voice changes and tremors for the past one year.  Patient and her family and friends have noticed voice changes over the past one year. Her voice sounds more hoarse than before. Initially she thought this was due to mucous problems or vocal cord abnormalities. Patient went to ENT who found no specific pathologies. Patient also noted tremors in her bilateral hands and legs over the past one year. She is having difficulty holding cups, difficulty with handwriting, and has noted shaking in her legs. She's not noticed head or chin tremor.  Patient denies any seeing anyfocal muscle twitching. She does feel some muscle spasms in her shoulders and legs. Patient denies any swallowing or choking problems. No double vision, slurred speech or weight loss. She does feel diffuse weakness. She's having more fatigue nowadays. She denies any smell or taste abnormalities. No vivid dreams or acting out dreams.  REVIEW OF SYSTEMS: Full 14 system review of systems performed and notable only for fatigue, swelling in legs, blurred vision, shortness of breath, anemia, joint pain, aching muscles, snoring. Otherwise negative.  ALLERGIES: Allergies  Allergen Reactions  . Metoprolol Other (See Comments)    Bradycardia    HOME MEDICATIONS: Outpatient Encounter Prescriptions as of 11/08/2012  Medication Sig Dispense Refill  . ALPRAZolam (XANAX) 0.5 MG tablet Take by mouth as directed.      Marland Kitchen amLODipine (NORVASC) 5 MG tablet Take by mouth Daily.       Marland Kitchen amoxicillin (AMOXIL) 500 MG capsule Take 500 mg by mouth 3 (three) times daily.      Marland Kitchen aspirin 81 MG chewable tablet Chew 81 mg by mouth daily.      . calcium carbonate (OS-CAL) 600 MG TABS Take 600 mg by mouth 2 (two) times daily with a meal.      . dexlansoprazole (DEXILANT) 60 MG capsule Take 60 mg by mouth as directed.      . DULoxetine (CYMBALTA) 30 MG capsule Take 30 mg by mouth daily.      . furosemide (LASIX) 40 MG tablet Take 40 mg by mouth daily. Patient uses this medication Mon, Tues., and Wed.      Marland Kitchen HYDROcodone-acetaminophen (VICODIN) 5-500 MG per tablet Take 1-2 tablets by mouth every 6 (six) hours as needed for pain.       Marland Kitchen levothyroxine (SYNTHROID, LEVOTHROID) 50 MCG tablet Take 50 mcg by mouth as directed.      . Vitamin D, Ergocalciferol, (DRISDOL) 50000 UNITS CAPS Take 50,000 Units by mouth every 7 (seven) days. Patient takes this medication on Tuesday.      Marland Kitchen spironolactone (ALDACTONE) 25 MG tablet Take 25 mg by mouth Twice daily.       No facility-administered encounter medications on file as of 11/08/2012.    PAST MEDICAL HISTORY: Past Medical History  Diagnosis Date  . Rotator cuff injury   . Fatigue   . Edema   . H/O: hysterectomy   . Hyperlipidemia   . Depression   . Anxiety   . Syncope   . Chest pain     Normal cardiac cath 5/09  . Dumping syndrome   . Lumbar spondylosis   .  Cervical spondylosis with myelopathy   . Hypertension   . Fibromyalgia     PAST SURGICAL HISTORY: Past Surgical History  Procedure Laterality Date  . Rotator cuff repair    . Total knee arthroplasty      left  . Appendectomy    . Back surgery      x3  . Total abdominal hysterectomy      FAMILY HISTORY: Family History  Problem Relation Age of Onset  . Heart disease Mother   . Heart failure Mother   . Cancer Father   . Cancer Brother   . Cancer Brother   . Cancer Brother   . Heart Problems    . Tremor Maternal Grandmother 54    SOCIAL HISTORY: History    Social History  . Marital Status: Widowed    Spouse Name: N/A    Number of Children: N/A  . Years of Education: N/A   Occupational History  . retired    Social History Main Topics  . Smoking status: Never Smoker   . Smokeless tobacco: Not on file  . Alcohol Use: No  . Drug Use: No  . Sexually Active: Not on file   Other Topics Concern  . Not on file   Social History Narrative   Pt does use caffeine. Lives alone. 3 children. Retired.     PHYSICAL EXAM  Filed Vitals:   11/08/12 1034  BP: 143/73  Pulse: 74  Temp: 98.2 F (36.8 C)  TempSrc: Oral  Height: 5\' 8"  (1.727 m)  Weight: 230 lb (104.327 kg)    GENERAL EXAM: Patient is in no distress  CARDIOVASCULAR: Regular rate and rhythm, no murmurs, no carotid bruits  NEUROLOGIC: MENTAL STATUS: awake, alert, language fluent, comprehension intact, naming intact CRANIAL NERVE: no papilledema on fundoscopic exam, pupils equal and reactive to light, visual fields full to confrontation, extraocular muscles intact, no nystagmus, facial sensation and strength symmetric, uvula midline, shoulder shrug symmetric, tongue midline; TONGUE FASCICULATIONS. MOTOR: normal bulk and tone, BUE (DELTOIDS 3, BICEPS/TRICEPS 4, GRIP 4). BLE 5/5. NO EXTREMITY FASCICULATIONS. NO RIGIDITY. INTERMITTENT REST TREMOR IN MOUTH AND HANDS. POSTURAL TREMOR IN BUE. ABLE TO DRAW SPIRALS SMOOTHLY.  SENSORY: normal and symmetric to light touch, pinprick, temperature, vibration; EXCEPT ABSENT VIB IN RIGHT LEG (KNEE TO TOES; DECR TO PP IN RIGHT LEG). COORDINATION: SLOW RIGHT ARM FINGER TO NOSE. REFLEXES: deep tendon reflexes present (2+) and symmetric; DOWN GOING TOES. GAIT/STATION: narrow based gait. DIFF WITH TANDEM, TOE AND HEEL GAIT. Romberg is negative   DIAGNOSTIC DATA (LABS, IMAGING, TESTING) - I reviewed patient records, labs, notes, testing and imaging myself where available.   Lab Results  Component Value Date   WBC 6.3 01/21/2011   HGB 12.3  01/21/2011   HCT 37.0 01/21/2011   MCV 87.3 01/21/2011   PLT 278 01/21/2011   CMP     Component Value Date/Time   NA 139 01/21/2011 1032   K 3.9 01/21/2011 1032   CL 98 01/21/2011 1032   CO2 35* 01/21/2011 1032   GLUCOSE 85 01/21/2011 1032   BUN 20 01/21/2011 1032   CREATININE 1.15 01/21/2011 1032   CALCIUM 10.2 01/21/2011 1032   GFRNONAA 47* 01/21/2011 1032   GFRAA  Value: 57        The eGFR has been calculated using the MDRD equation. This calculation has not been validated in all clinical situations. eGFR's persistently <60 mL/min signify possible Chronic Kidney Disease.* 01/21/2011 1032    No results found for  this basename: HGBA1C   No results found for this basename: VITAMINB12   No results found for this basename: TSH   10/04/12 CT SINUSES - "Only minimal ethmoid sinus mucosal thickening. No CT evidence of acute or chronic sinusitis."  09/21/06 CT HEAD - mild atrophy, mild chronic small vessel ischemic disease    ASSESSMENT AND PLAN 67 year old right-handed female with history of hypertension, hypercholesterolemia, fibromyalgia, cervical spondylosis with myelopathy, lumbar spondylosis status post decompression, rotator cuff injury, left knee replacement, here for evaluation of voice changes and tremors for the past one year. Exam notable for tongue fasciculations, weakness of the bilateral upper extremities proximally, mild postural tremor of the head, mouth and bilateral hands. Differential diagnosis is broad at this point with central and peripheral nervous system possibilities.  Ddx: essential tremor, enhanced physiologic tremor, metabolic, CNS inflammatory, CNS vascular, neuromuscular (myasthenia gravis, motor neuron disease, myopathy).  - MRI brain and cervical spine - Labs - EMG/NCS   Suanne Marker, MD 11/08/2012, 11:13 AM Certified in Neurology, Neurophysiology and Neuroimaging  [facility] [address] [place], [place] [address] [phone]

## 2012-11-08 NOTE — H&P (Signed)
TOTAL KNEE ADMISSION H&P  Patient is being admitted for right total knee arthroplasty.  Subjective:  Chief Complaint:right knee pain.  HPI: [name], 67 y.o. female, has a history of pain and functional disability in the right knee due to arthritis and has failed non-surgical conservative treatments for greater than 12 weeks to includeNSAID's and/or analgesics, corticosteriod injections, viscosupplementation injections, use of assistive devices and activity modification.  Onset of symptoms was gradual, starting 8 years ago with gradually worsening course since that time. The patient noted prior procedures on the knee to include  menisectomy arthroscopy on the right knee(s).  Patient currently rates pain in the right knee(s) at 8 out of 10 with activity. Patient has night pain, worsening of pain with activity and weight bearing, pain that interferes with activities of daily living, pain with passive range of motion, crepitus and joint swelling.  Patient has evidence of periarticular osteophytes and joint space narrowing by imaging studies.  There is no active infection.  Patient Active Problem List   Diagnosis Date Noted  . Dyspnea 05/17/2012  . Peripheral edema 05/17/2012  . Weight gain 05/17/2012  . HTN (hypertension) 05/17/2012   Past Medical History  Diagnosis Date  . Rotator cuff injury   . Fatigue   . Edema   . H/O: hysterectomy   . Hyperlipidemia   . Depression   . Anxiety   . Syncope   . Chest pain     Normal cardiac cath 5/09  . Dumping syndrome   . Lumbar spondylosis   . Cervical spondylosis with myelopathy   . Hypertension   . Fibromyalgia     Past Surgical History  Procedure Laterality Date  . Rotator cuff repair    . Total knee arthroplasty      left  . Appendectomy    . Back surgery      x3  . Total abdominal hysterectomy       (Not in a hospital admission) Allergies  Allergen Reactions  . Metoprolol Other (See Comments)    Bradycardia      History  Substance Use Topics  . Smoking status: Never Smoker   . Smokeless tobacco: Not on file  . Alcohol Use: No    Family History  Problem Relation Age of Onset  . Heart disease Mother   . Heart failure Mother   . Cancer Father   . Cancer Brother   . Cancer Brother   . Cancer Brother   . Heart Problems    . Tremor Maternal Grandmother 90     Review of Systems  Constitutional: Negative.   HENT: Negative.   Eyes: Negative.   Respiratory: Positive for shortness of breath. Negative for cough, hemoptysis, sputum production and wheezing.   Cardiovascular: Positive for leg swelling. Negative for chest pain, palpitations, orthopnea and claudication.  Gastrointestinal: Negative.   Genitourinary: Negative.   Musculoskeletal: Positive for joint pain. Negative for falls.  Skin: Negative.   Neurological: Negative.   Endo/Heme/Allergies: Negative.   Psychiatric/Behavioral: Negative.     Objective:  Physical Exam  Constitutional: She is oriented to person, place, and time. She appears well-developed and well-nourished. No distress.  HENT:  Head: Normocephalic and atraumatic.  Nose: Nose normal.  Eyes: Conjunctivae and EOM are normal. Pupils are equal, round, and reactive to light.  Neck: Normal range of motion. Neck supple.  Cardiovascular: Normal rate, regular rhythm, normal heart sounds and intact distal pulses.   Respiratory: Effort normal and breath sounds normal. No respiratory   distress. She has no wheezes. She exhibits no tenderness.  GI: Soft. Bowel sounds are normal. She exhibits no distension. There is no tenderness.  Musculoskeletal: She exhibits edema.  Pt with significant bilat LE edema non pitting, right knee with joint line tenderness, stable ligamentous testing, patellofemoral crepitus, no calf tenderness, intact DF/PF ankle  Lymphadenopathy:    She has no cervical adenopathy.  Neurological: She is alert and oriented to person, place, and time. No cranial nerve  deficit.  Skin: Skin is warm and dry. No rash noted. No erythema.  Psychiatric: She has a normal mood and affect. Her behavior is normal.    Vital signs in last 24 hours: @VSRANGES@  Labs:   Estimated body mass index is 34.98 kg/(m^2) as calculated from the following:   Height as of 05/17/12: 5' 8" (1.727 m).   Weight as of an earlier encounter on 11/08/12: 104.327 kg (230 lb).   Imaging Review Plain radiographs demonstrate severe degenerative joint disease of the right knee(s). The overall alignment isneutral. The bone quality appears to be good for age and reported activity level.  Assessment/Plan:  End stage arthritis, right knee   The patient history, physical examination, clinical judgment of the provider and imaging studies are consistent with end stage degenerative joint disease of the right knee(s) and total knee arthroplasty is deemed medically necessary. The treatment options including medical management, injection therapy arthroscopy and arthroplasty were discussed at length. The risks and benefits of total knee arthroplasty were presented and reviewed. The risks due to aseptic loosening, infection, stiffness, patella tracking problems, thromboembolic complications and other imponderables were discussed. The patient acknowledged the explanation, agreed to proceed with the plan and consent was signed. Patient is being admitted for inpatient treatment for surgery, pain control, PT, OT, prophylactic antibiotics, VTE prophylaxis, progressive ambulation and ADL's and discharge planning. The patient is planning to be discharged home with home health services   

## 2012-11-08 NOTE — Patient Instructions (Addendum)
We will order additional testing.

## 2012-11-09 LAB — CK: Total CK: 98 U/L (ref 24–173)

## 2012-11-09 LAB — ACETYLCHOLINE RECEPTOR, BINDING: AChR Binding Ab, Serum: 0.16 nmol/L (ref 0.00–0.24)

## 2012-11-10 ENCOUNTER — Encounter (HOSPITAL_COMMUNITY): Payer: Self-pay

## 2012-11-10 ENCOUNTER — Encounter (HOSPITAL_COMMUNITY)
Admission: RE | Admit: 2012-11-10 | Discharge: 2012-11-10 | Disposition: A | Discharge status: Home / Self Care | Payer: Medicare Other | Source: Ambulatory Visit | Attending: Physician Assistant | Admitting: Physician Assistant

## 2012-11-10 ENCOUNTER — Encounter (HOSPITAL_COMMUNITY)
Admission: RE | Admit: 2012-11-10 | Discharge: 2012-11-10 | Disposition: A | Discharge status: Home / Self Care | Payer: Medicare Other | Source: Ambulatory Visit | Attending: Orthopedic Surgery | Admitting: Orthopedic Surgery

## 2012-11-10 DIAGNOSIS — Z01812 Encounter for preprocedural laboratory examination: Secondary | ICD-10-CM | POA: Insufficient documentation

## 2012-11-10 DIAGNOSIS — Z01818 Encounter for other preprocedural examination: Secondary | ICD-10-CM | POA: Insufficient documentation

## 2012-11-10 HISTORY — DX: Anemia, unspecified: D64.9

## 2012-11-10 HISTORY — DX: Restless legs syndrome: G25.81

## 2012-11-10 HISTORY — DX: Tremor, unspecified: R25.1

## 2012-11-10 HISTORY — DX: Nausea with vomiting, unspecified: R11.2

## 2012-11-10 HISTORY — DX: Hypothyroidism, unspecified: E03.9

## 2012-11-10 HISTORY — DX: Other specified postprocedural states: Z98.890

## 2012-11-10 LAB — CBC WITH DIFFERENTIAL/PLATELET
Eosinophils Absolute: 0.2 10*3/uL (ref 0.0–0.7)
Eosinophils Relative: 3 % (ref 0–5)
Hemoglobin: 11.7 g/dL — ABNORMAL LOW (ref 12.0–15.0)
Lymphs Abs: 2.8 10*3/uL (ref 0.7–4.0)
MCH: 29.4 pg (ref 26.0–34.0)
MCV: 88.9 fL (ref 78.0–100.0)
Monocytes Relative: 7 % (ref 3–12)
RBC: 3.98 MIL/uL (ref 3.87–5.11)

## 2012-11-10 LAB — URINE MICROSCOPIC-ADD ON

## 2012-11-10 LAB — COMPREHENSIVE METABOLIC PANEL
BUN: 19 mg/dL (ref 6–23)
Calcium: 10.3 mg/dL (ref 8.4–10.5)
Creatinine, Ser: 1.18 mg/dL — ABNORMAL HIGH (ref 0.50–1.10)
GFR calc Af Amer: 54 mL/min — ABNORMAL LOW (ref >90)
Glucose, Bld: 99 mg/dL (ref 70–99)
Total Protein: 7.8 g/dL (ref 6.0–8.3)

## 2012-11-10 LAB — PROTIME-INR: Prothrombin Time: 13.4 seconds (ref 11.6–15.2)

## 2012-11-10 LAB — TYPE AND SCREEN
ABO/RH(D): O NEG
Antibody Screen: NEGATIVE

## 2012-11-10 LAB — URINALYSIS, ROUTINE W REFLEX MICROSCOPIC
Hgb urine dipstick: NEGATIVE
Protein, ur: NEGATIVE mg/dL
Specific Gravity, Urine: 1.022 (ref 1.005–1.030)
Urobilinogen, UA: 0.2 mg/dL (ref 0.0–1.0)

## 2012-11-10 MED ORDER — CHLORHEXIDINE GLUCONATE 4 % EX LIQD: 60.0000 mL | Freq: Once | CUTANEOUS | Status: DC

## 2012-11-10 NOTE — Pre-Procedure Instructions (Signed)
[name]  11/10/2012   Your procedure is scheduled on:  Friday, March 28  Report to [facility] at 0830 AM.  Call this number if you have problems the morning of surgery: [phone]   Remember:   Do not eat food or drink liquids after midnight.   Take these medicines the morning of surgery with A SIP OF WATER: Norvasc,Dexilant,Cymbalta,Synthroid   Do not wear jewelry, make-up or nail polish.  Do not wear lotions, powders,   Perfumes or wear deodorant.  Do not shave 48 hours prior to surgery.    Do not bring valuables to the hospital.  Contacts, dentures or bridgework may not be worn into surgery.  Leave suitcase in the car. After surgery it may be brought to your room.  For patients admitted to the hospital, checkout time is 11:00 AM the day of  discharge.      Special Instructions: Shower using CHG 2 nights before surgery and the night before surgery.  If you shower the day of surgery use CHG.  Use special wash - you have one bottle of CHG for all showers.  You should use approximately 1/3 of the bottle for each shower.   Please read over the following fact sheets that you were given: Pain Booklet, Coughing and Deep Breathing, Blood Transfusion Information and Surgical Site Infection Prevention

## 2012-11-11 LAB — URINE CULTURE

## 2012-11-17 ENCOUNTER — Other Ambulatory Visit: Payer: Medicare Other

## 2012-11-17 ENCOUNTER — Ambulatory Visit
Admission: RE | Admit: 2012-11-17 | Discharge: 2012-11-17 | Disposition: A | Discharge status: Home / Self Care | Payer: Medicare Other | Source: Ambulatory Visit | Attending: Diagnostic Neuroimaging | Admitting: Diagnostic Neuroimaging

## 2012-11-17 DIAGNOSIS — R251 Tremor, unspecified: Secondary | ICD-10-CM

## 2012-11-17 DIAGNOSIS — R531 Weakness: Secondary | ICD-10-CM

## 2012-11-17 DIAGNOSIS — R253 Fasciculation: Secondary | ICD-10-CM

## 2012-11-17 DIAGNOSIS — R259 Unspecified abnormal involuntary movements: Secondary | ICD-10-CM

## 2012-11-17 MED ORDER — SODIUM CHLORIDE 0.9 % IV SOLN: INTRAVENOUS | Status: DC

## 2012-11-17 MED ORDER — CEFAZOLIN SODIUM-DEXTROSE 2-3 GM-% IV SOLR
2.0000 g | INTRAVENOUS | Status: AC
  Administered 2012-11-18: 2 g via INTRAVENOUS
  Administered 2012-11-18: 1 g via INTRAVENOUS
  Filled 2012-11-17: qty 50

## 2012-11-18 ENCOUNTER — Inpatient Hospital Stay (HOSPITAL_COMMUNITY)
Admission: RE | Admit: 2012-11-18 | Discharge: 2012-11-21 | DRG: 470 | Disposition: A | Discharge status: Home Health | Payer: Medicare Other | Source: Ambulatory Visit | Attending: Orthopedic Surgery | Admitting: Orthopedic Surgery

## 2012-11-18 ENCOUNTER — Inpatient Hospital Stay (HOSPITAL_COMMUNITY): Payer: Medicare Other | Admitting: Anesthesiology

## 2012-11-18 ENCOUNTER — Encounter (HOSPITAL_COMMUNITY)
Admission: RE | Disposition: A | Discharge status: Home Health | Payer: Self-pay | Source: Ambulatory Visit | Attending: Orthopedic Surgery

## 2012-11-18 ENCOUNTER — Encounter (HOSPITAL_COMMUNITY): Payer: Self-pay | Admitting: Anesthesiology

## 2012-11-18 ENCOUNTER — Encounter (HOSPITAL_COMMUNITY): Payer: Self-pay | Admitting: *Deleted

## 2012-11-18 DIAGNOSIS — Z7901 Long term (current) use of anticoagulants: Secondary | ICD-10-CM

## 2012-11-18 DIAGNOSIS — Z01812 Encounter for preprocedural laboratory examination: Secondary | ICD-10-CM

## 2012-11-18 DIAGNOSIS — F411 Generalized anxiety disorder: Secondary | ICD-10-CM | POA: Diagnosis present

## 2012-11-18 DIAGNOSIS — M1710 Unilateral primary osteoarthritis, unspecified knee: Principal | ICD-10-CM | POA: Diagnosis present

## 2012-11-18 DIAGNOSIS — IMO0001 Reserved for inherently not codable concepts without codable children: Secondary | ICD-10-CM | POA: Diagnosis present

## 2012-11-18 DIAGNOSIS — Z8249 Family history of ischemic heart disease and other diseases of the circulatory system: Secondary | ICD-10-CM

## 2012-11-18 DIAGNOSIS — F329 Major depressive disorder, single episode, unspecified: Secondary | ICD-10-CM | POA: Diagnosis present

## 2012-11-18 DIAGNOSIS — M4712 Other spondylosis with myelopathy, cervical region: Secondary | ICD-10-CM | POA: Diagnosis present

## 2012-11-18 DIAGNOSIS — F3289 Other specified depressive episodes: Secondary | ICD-10-CM | POA: Diagnosis present

## 2012-11-18 DIAGNOSIS — G2581 Restless legs syndrome: Secondary | ICD-10-CM | POA: Diagnosis present

## 2012-11-18 DIAGNOSIS — I10 Essential (primary) hypertension: Secondary | ICD-10-CM | POA: Diagnosis present

## 2012-11-18 DIAGNOSIS — Z79899 Other long term (current) drug therapy: Secondary | ICD-10-CM

## 2012-11-18 DIAGNOSIS — M47817 Spondylosis without myelopathy or radiculopathy, lumbosacral region: Secondary | ICD-10-CM | POA: Diagnosis present

## 2012-11-18 DIAGNOSIS — E785 Hyperlipidemia, unspecified: Secondary | ICD-10-CM | POA: Diagnosis present

## 2012-11-18 HISTORY — PX: TOTAL KNEE ARTHROPLASTY: SHX125

## 2012-11-18 LAB — CBC
HCT: 32.1 % — ABNORMAL LOW (ref 36.0–46.0)
Hemoglobin: 10.5 g/dL — ABNORMAL LOW (ref 12.0–15.0)
MCH: 28.9 pg (ref 26.0–34.0)
MCHC: 32.7 g/dL (ref 30.0–36.0)
MCV: 88.4 fL (ref 78.0–100.0)

## 2012-11-18 SURGERY — ARTHROPLASTY, KNEE, TOTAL
Anesthesia: Regional | Site: Knee | Laterality: Right | Wound class: Clean

## 2012-11-18 MED ORDER — SODIUM CHLORIDE 0.9 % IV SOLN
INTRAVENOUS | Status: DC
  Administered 2012-11-18: 16:00:00 via INTRAVENOUS

## 2012-11-18 MED ORDER — 0.9 % SODIUM CHLORIDE (POUR BTL) OPTIME
TOPICAL | Status: DC | PRN
  Administered 2012-11-18: 1000 mL

## 2012-11-18 MED ORDER — LACTATED RINGERS IV SOLN
INTRAVENOUS | Status: DC
  Administered 2012-11-18: 10:00:00 via INTRAVENOUS

## 2012-11-18 MED ORDER — ONDANSETRON HCL 4 MG/2ML IJ SOLN
INTRAMUSCULAR | Status: DC | PRN
  Administered 2012-11-18: 4 mg via INTRAVENOUS

## 2012-11-18 MED ORDER — LIDOCAINE HCL (CARDIAC) 20 MG/ML IV SOLN
INTRAVENOUS | Status: DC | PRN
  Administered 2012-11-18: 100 mg via INTRAVENOUS

## 2012-11-18 MED ORDER — FENTANYL CITRATE 0.05 MG/ML IJ SOLN
INTRAMUSCULAR | Status: AC
  Administered 2012-11-18: 100 ug
  Filled 2012-11-18: qty 2

## 2012-11-18 MED ORDER — DULOXETINE HCL 30 MG PO CPEP
30.0000 mg | ORAL_CAPSULE | Freq: Every day | ORAL | Status: DC
  Administered 2012-11-19 – 2012-11-20 (×2): 30 mg via ORAL
  Filled 2012-11-18 (×2): qty 1

## 2012-11-18 MED ORDER — ROCURONIUM BROMIDE 100 MG/10ML IV SOLN
INTRAVENOUS | Status: DC | PRN
  Administered 2012-11-18 (×2): 10 mg via INTRAVENOUS
  Administered 2012-11-18: 40 mg via INTRAVENOUS

## 2012-11-18 MED ORDER — DEXAMETHASONE SODIUM PHOSPHATE 4 MG/ML IJ SOLN
INTRAMUSCULAR | Status: DC | PRN
  Administered 2012-11-18: 8 mg via INTRAVENOUS

## 2012-11-18 MED ORDER — FUROSEMIDE 40 MG PO TABS
40.0000 mg | ORAL_TABLET | ORAL | Status: DC
  Administered 2012-11-21: 40 mg via ORAL
  Filled 2012-11-18: qty 1

## 2012-11-18 MED ORDER — BISACODYL 10 MG RE SUPP: 10.0000 mg | Freq: Every day | RECTAL | Status: DC | PRN

## 2012-11-18 MED ORDER — ONDANSETRON HCL 4 MG PO TABS: 4.0000 mg | ORAL_TABLET | Freq: Four times a day (QID) | ORAL | Status: DC | PRN

## 2012-11-18 MED ORDER — ACETAMINOPHEN 10 MG/ML IV SOLN
INTRAVENOUS | Status: AC
  Administered 2012-11-18: 1000 mg via INTRAVENOUS
  Filled 2012-11-18: qty 100

## 2012-11-18 MED ORDER — ENOXAPARIN SODIUM 30 MG/0.3ML ~~LOC~~ SOLN
30.0000 mg | Freq: Two times a day (BID) | SUBCUTANEOUS | Status: DC
  Administered 2012-11-19 – 2012-11-21 (×5): 30 mg via SUBCUTANEOUS
  Filled 2012-11-18 (×8): qty 0.3

## 2012-11-18 MED ORDER — PHENOL 1.4 % MT LIQD: 1.0000 | OROMUCOSAL | Status: DC | PRN

## 2012-11-18 MED ORDER — HYDROMORPHONE HCL PF 1 MG/ML IJ SOLN
INTRAMUSCULAR | Status: AC
  Filled 2012-11-18: qty 1

## 2012-11-18 MED ORDER — VITAMIN D (ERGOCALCIFEROL) 1.25 MG (50000 UNIT) PO CAPS
50000.0000 [IU] | ORAL_CAPSULE | ORAL | Status: DC
  Filled 2012-11-18: qty 1

## 2012-11-18 MED ORDER — SPIRONOLACTONE 25 MG PO TABS
25.0000 mg | ORAL_TABLET | Freq: Two times a day (BID) | ORAL | Status: DC
  Administered 2012-11-18 – 2012-11-21 (×6): 25 mg via ORAL
  Filled 2012-11-18 (×9): qty 1

## 2012-11-18 MED ORDER — DOCUSATE SODIUM 100 MG PO CAPS
100.0000 mg | ORAL_CAPSULE | Freq: Two times a day (BID) | ORAL | Status: DC
  Administered 2012-11-18 – 2012-11-21 (×6): 100 mg via ORAL
  Filled 2012-11-18 (×7): qty 1

## 2012-11-18 MED ORDER — METOCLOPRAMIDE HCL 10 MG PO TABS: 5.0000 mg | ORAL_TABLET | Freq: Three times a day (TID) | ORAL | Status: DC | PRN

## 2012-11-18 MED ORDER — SODIUM CHLORIDE 0.9 % IR SOLN
Status: DC | PRN
  Administered 2012-11-18: 3000 mL

## 2012-11-18 MED ORDER — CEFAZOLIN SODIUM 1-5 GM-% IV SOLN
INTRAVENOUS | Status: AC
  Filled 2012-11-18: qty 50

## 2012-11-18 MED ORDER — SENNOSIDES-DOCUSATE SODIUM 8.6-50 MG PO TABS: 1.0000 | ORAL_TABLET | Freq: Every evening | ORAL | Status: DC | PRN

## 2012-11-18 MED ORDER — OXYCODONE HCL 5 MG PO TABS
5.0000 mg | ORAL_TABLET | ORAL | Status: DC | PRN
  Administered 2012-11-18 – 2012-11-19 (×3): 10 mg via ORAL
  Filled 2012-11-18 (×3): qty 2

## 2012-11-18 MED ORDER — AMLODIPINE BESYLATE 5 MG PO TABS
5.0000 mg | ORAL_TABLET | Freq: Every day | ORAL | Status: DC
  Administered 2012-11-18 – 2012-11-21 (×4): 5 mg via ORAL
  Filled 2012-11-18 (×4): qty 1

## 2012-11-18 MED ORDER — PROPOFOL 10 MG/ML IV BOLUS
INTRAVENOUS | Status: DC | PRN
  Administered 2012-11-18: 20 mg via INTRAVENOUS
  Administered 2012-11-18: 180 mg via INTRAVENOUS

## 2012-11-18 MED ORDER — FENTANYL CITRATE 0.05 MG/ML IJ SOLN
INTRAMUSCULAR | Status: DC | PRN
  Administered 2012-11-18: 50 ug via INTRAVENOUS
  Administered 2012-11-18: 100 ug via INTRAVENOUS
  Administered 2012-11-18: 150 ug via INTRAVENOUS
  Administered 2012-11-18: 50 ug via INTRAVENOUS
  Administered 2012-11-18: 100 ug via INTRAVENOUS
  Administered 2012-11-18: 50 ug via INTRAVENOUS

## 2012-11-18 MED ORDER — OXYCODONE HCL 5 MG PO TABS: ORAL_TABLET | ORAL | Status: DC

## 2012-11-18 MED ORDER — MIDAZOLAM HCL 2 MG/2ML IJ SOLN
INTRAMUSCULAR | Status: AC
  Administered 2012-11-18: 2 mg
  Filled 2012-11-18: qty 2

## 2012-11-18 MED ORDER — DIPHENHYDRAMINE HCL 50 MG/ML IJ SOLN
12.5000 mg | Freq: Four times a day (QID) | INTRAMUSCULAR | Status: DC | PRN
  Administered 2012-11-19: 12.5 mg via INTRAVENOUS
  Filled 2012-11-18: qty 1

## 2012-11-18 MED ORDER — ACETAMINOPHEN 10 MG/ML IV SOLN: 1000.0000 mg | Freq: Four times a day (QID) | INTRAVENOUS | Status: DC

## 2012-11-18 MED ORDER — GLYCOPYRROLATE 0.2 MG/ML IJ SOLN
INTRAMUSCULAR | Status: DC | PRN
  Administered 2012-11-18: 0.4 mg via INTRAVENOUS

## 2012-11-18 MED ORDER — ACETAMINOPHEN 325 MG PO TABS: 650.0000 mg | ORAL_TABLET | Freq: Four times a day (QID) | ORAL | Status: DC | PRN

## 2012-11-18 MED ORDER — ONDANSETRON HCL 4 MG/2ML IJ SOLN: 4.0000 mg | Freq: Once | INTRAMUSCULAR | Status: DC | PRN

## 2012-11-18 MED ORDER — FLEET ENEMA 7-19 GM/118ML RE ENEM: 1.0000 | ENEMA | Freq: Once | RECTAL | Status: AC | PRN

## 2012-11-18 MED ORDER — CEFAZOLIN SODIUM 1-5 GM-% IV SOLN
1.0000 g | Freq: Four times a day (QID) | INTRAVENOUS | Status: AC
  Administered 2012-11-18 – 2012-11-19 (×2): 1 g via INTRAVENOUS
  Filled 2012-11-18 (×2): qty 50

## 2012-11-18 MED ORDER — ACETAMINOPHEN 10 MG/ML IV SOLN: 1000.0000 mg | Freq: Once | INTRAVENOUS | Status: DC | PRN

## 2012-11-18 MED ORDER — LEVOTHYROXINE SODIUM 50 MCG PO TABS
50.0000 ug | ORAL_TABLET | Freq: Every day | ORAL | Status: DC
  Administered 2012-11-19 – 2012-11-21 (×3): 50 ug via ORAL
  Filled 2012-11-18 (×4): qty 1

## 2012-11-18 MED ORDER — DIPHENHYDRAMINE HCL 12.5 MG/5ML PO ELIX
25.0000 mg | ORAL_SOLUTION | Freq: Four times a day (QID) | ORAL | Status: DC | PRN
  Administered 2012-11-18 – 2012-11-20 (×3): 25 mg via ORAL
  Filled 2012-11-18 (×3): qty 10

## 2012-11-18 MED ORDER — METOCLOPRAMIDE HCL 5 MG/ML IJ SOLN: 5.0000 mg | Freq: Three times a day (TID) | INTRAMUSCULAR | Status: DC | PRN

## 2012-11-18 MED ORDER — ACETAMINOPHEN 650 MG RE SUPP: 650.0000 mg | Freq: Four times a day (QID) | RECTAL | Status: DC | PRN

## 2012-11-18 MED ORDER — NEOSTIGMINE METHYLSULFATE 1 MG/ML IJ SOLN
INTRAMUSCULAR | Status: DC | PRN
  Administered 2012-11-18: 3 mg via INTRAVENOUS

## 2012-11-18 MED ORDER — ARTIFICIAL TEARS OP OINT
TOPICAL_OINTMENT | OPHTHALMIC | Status: DC | PRN
  Administered 2012-11-18: 1 via OPHTHALMIC

## 2012-11-18 MED ORDER — ONDANSETRON HCL 4 MG/2ML IJ SOLN: 4.0000 mg | Freq: Four times a day (QID) | INTRAMUSCULAR | Status: DC | PRN

## 2012-11-18 MED ORDER — ACETAMINOPHEN 10 MG/ML IV SOLN
1000.0000 mg | Freq: Four times a day (QID) | INTRAVENOUS | Status: AC
  Administered 2012-11-18 – 2012-11-19 (×3): 1000 mg via INTRAVENOUS
  Filled 2012-11-18 (×4): qty 100

## 2012-11-18 MED ORDER — CALCIUM CARBONATE 600 MG PO TABS
600.0000 mg | ORAL_TABLET | Freq: Two times a day (BID) | ORAL | Status: DC
  Administered 2012-11-18: 600 mg via ORAL
  Filled 2012-11-18 (×4): qty 1

## 2012-11-18 MED ORDER — HYDROMORPHONE HCL PF 1 MG/ML IJ SOLN
0.5000 mg | INTRAMUSCULAR | Status: DC | PRN
  Administered 2012-11-18 – 2012-11-19 (×3): 0.5 mg via INTRAVENOUS
  Filled 2012-11-18 (×2): qty 1

## 2012-11-18 MED ORDER — MIDAZOLAM HCL 5 MG/5ML IJ SOLN
INTRAMUSCULAR | Status: DC | PRN
  Administered 2012-11-18: 2 mg via INTRAVENOUS

## 2012-11-18 MED ORDER — PANTOPRAZOLE SODIUM 40 MG PO TBEC
40.0000 mg | DELAYED_RELEASE_TABLET | Freq: Every day | ORAL | Status: DC
  Administered 2012-11-19 – 2012-11-21 (×3): 40 mg via ORAL
  Filled 2012-11-18 (×3): qty 1

## 2012-11-18 MED ORDER — MENTHOL 3 MG MT LOZG: 1.0000 | LOZENGE | OROMUCOSAL | Status: DC | PRN

## 2012-11-18 MED ORDER — PRAMIPEXOLE DIHYDROCHLORIDE 0.25 MG PO TABS
0.2500 mg | ORAL_TABLET | Freq: Every evening | ORAL | Status: DC | PRN
  Administered 2012-11-18 – 2012-11-20 (×6): 0.25 mg via ORAL
  Filled 2012-11-18 (×8): qty 1

## 2012-11-18 MED ORDER — LACTATED RINGERS IV SOLN
INTRAVENOUS | Status: DC | PRN
  Administered 2012-11-18 (×3): via INTRAVENOUS

## 2012-11-18 MED ORDER — HYDROMORPHONE HCL PF 1 MG/ML IJ SOLN
0.2500 mg | INTRAMUSCULAR | Status: DC | PRN
  Administered 2012-11-18 (×4): 0.5 mg via INTRAVENOUS

## 2012-11-18 MED ORDER — DEXTROSE 5 % IV SOLN
INTRAVENOUS | Status: DC | PRN
  Administered 2012-11-18 (×2): via INTRAVENOUS

## 2012-11-18 MED ORDER — ENOXAPARIN SODIUM 30 MG/0.3ML ~~LOC~~ SOLN: 30.0000 mg | Freq: Two times a day (BID) | SUBCUTANEOUS | Status: DC

## 2012-11-18 SURGICAL SUPPLY — 60 items
BANDAGE ELASTIC 4 VELCRO ST LF (GAUZE/BANDAGES/DRESSINGS) ×2 IMPLANT
BANDAGE ELASTIC 6 VELCRO ST LF (GAUZE/BANDAGES/DRESSINGS) ×1 IMPLANT
BANDAGE ESMARK 6X9 LF (GAUZE/BANDAGES/DRESSINGS) ×1 IMPLANT
BLADE SAGITTAL 25.0X1.19X90 (BLADE) ×2 IMPLANT
BLADE SAW SAG 90X13X1.27 (BLADE) ×2 IMPLANT
BNDG CMPR 9X6 STRL LF SNTH (GAUZE/BANDAGES/DRESSINGS) ×1
BNDG ESMARK 6X9 LF (GAUZE/BANDAGES/DRESSINGS) ×2
BOWL SMART MIX CTS (DISPOSABLE) ×2 IMPLANT
CEMENT HV SMART SET (Cement) ×2 IMPLANT
CLOTH BEACON ORANGE TIMEOUT ST (SAFETY) ×2 IMPLANT
CO AXIAL FAN SPRAY TIP SOFT SH (MISCELLANEOUS) ×1 IMPLANT
COVER SURGICAL LIGHT HANDLE (MISCELLANEOUS) ×2 IMPLANT
CUFF TOURNIQUET SINGLE 34IN LL (TOURNIQUET CUFF) ×2 IMPLANT
CUFF TOURNIQUET SINGLE 44IN (TOURNIQUET CUFF) IMPLANT
DRAPE INCISE IOBAN 66X45 STRL (DRAPES) IMPLANT
DRAPE ORTHO SPLIT 77X108 STRL (DRAPES) ×4
DRAPE SURG ORHT 6 SPLT 77X108 (DRAPES) ×2 IMPLANT
DRAPE U-SHAPE 47X51 STRL (DRAPES) ×2 IMPLANT
DRSG ADAPTIC 3X8 NADH LF (GAUZE/BANDAGES/DRESSINGS) ×2 IMPLANT
DRSG PAD ABDOMINAL 8X10 ST (GAUZE/BANDAGES/DRESSINGS) ×5 IMPLANT
DURAPREP 26ML APPLICATOR (WOUND CARE) ×3 IMPLANT
ELECT REM PT RETURN 9FT ADLT (ELECTROSURGICAL) ×2
ELECTRODE REM PT RTRN 9FT ADLT (ELECTROSURGICAL) ×1 IMPLANT
EVACUATOR 1/8 PVC DRAIN (DRAIN) ×2 IMPLANT
FACESHIELD LNG OPTICON STERILE (SAFETY) ×4 IMPLANT
FLOSEAL 10ML (HEMOSTASIS) IMPLANT
GLOVE BIOGEL PI IND STRL 8 (GLOVE) ×4 IMPLANT
GLOVE BIOGEL PI INDICATOR 8 (GLOVE) ×4
GLOVE ORTHO TXT STRL SZ7.5 (GLOVE) ×6 IMPLANT
GLOVE SURG ORTHO 8.0 STRL STRW (GLOVE) ×6 IMPLANT
GOWN PREVENTION PLUS XLARGE (GOWN DISPOSABLE) ×2 IMPLANT
GOWN PREVENTION PLUS XXLARGE (GOWN DISPOSABLE) ×2 IMPLANT
GOWN STRL NON-REIN LRG LVL3 (GOWN DISPOSABLE) ×4 IMPLANT
HANDPIECE INTERPULSE COAX TIP (DISPOSABLE) ×2
HOOD PEEL AWAY FACE SHEILD DIS (HOOD) ×2 IMPLANT
IMMOBILIZER KNEE 22 UNIV (SOFTGOODS) ×2 IMPLANT
KIT BASIN OR (CUSTOM PROCEDURE TRAY) ×2 IMPLANT
KIT ROOM TURNOVER OR (KITS) ×2 IMPLANT
MANIFOLD NEPTUNE II (INSTRUMENTS) ×2 IMPLANT
NEEDLE 22X1 1/2 (OR ONLY) (NEEDLE) IMPLANT
NS IRRIG 1000ML POUR BTL (IV SOLUTION) ×2 IMPLANT
PACK TOTAL JOINT (CUSTOM PROCEDURE TRAY) ×2 IMPLANT
PAD ARMBOARD 7.5X6 YLW CONV (MISCELLANEOUS) ×4 IMPLANT
PAD CAST 4YDX4 CTTN HI CHSV (CAST SUPPLIES) ×1 IMPLANT
PADDING CAST COTTON 4X4 STRL (CAST SUPPLIES) ×2
PADDING CAST COTTON 6X4 STRL (CAST SUPPLIES) ×3 IMPLANT
SET HNDPC FAN SPRY TIP SCT (DISPOSABLE) ×1 IMPLANT
SPONGE GAUZE 4X4 12PLY (GAUZE/BANDAGES/DRESSINGS) ×2 IMPLANT
STAPLER VISISTAT 35W (STAPLE) ×2 IMPLANT
SUCTION FRAZIER TIP 10 FR DISP (SUCTIONS) ×2 IMPLANT
SUT ETHIBOND NAB CT1 #1 30IN (SUTURE) ×6 IMPLANT
SUT VIC AB 0 CT1 27 (SUTURE) ×4
SUT VIC AB 0 CT1 27XBRD ANBCTR (SUTURE) ×1 IMPLANT
SUT VIC AB 2-0 CT1 27 (SUTURE) ×4
SUT VIC AB 2-0 CT1 TAPERPNT 27 (SUTURE) ×2 IMPLANT
SYR CONTROL 10ML LL (SYRINGE) IMPLANT
TOWEL OR 17X24 6PK STRL BLUE (TOWEL DISPOSABLE) ×2 IMPLANT
TOWEL OR 17X26 10 PK STRL BLUE (TOWEL DISPOSABLE) ×2 IMPLANT
TRAY FOLEY CATH 14FR (SET/KITS/TRAYS/PACK) ×2 IMPLANT
WATER STERILE IRR 1000ML POUR (IV SOLUTION) ×4 IMPLANT

## 2012-11-18 NOTE — Interval H&P Note (Signed)
History and Physical Interval Note:  11/18/2012 10:22 AM  [name]  has presented today for surgery, with the diagnosis of OA RIGHT KNEE  The various methods of treatment have been discussed with the patient and family. After consideration of risks, benefits and other options for treatment, the patient has consented to  Procedure(s): TOTAL KNEE ARTHROPLASTY (Right) as a surgical intervention .  The patient's history has been reviewed, patient examined, no change in status, stable for surgery.  I have reviewed the patient's chart and labs.  Questions were answered to the patient's satisfaction.     Dorann Davidson JR,W D

## 2012-11-18 NOTE — Progress Notes (Signed)
ANTICOAGULATION CONSULT NOTE - Initial Consult  Pharmacy Consult for lovenox Indication: s/p R TKA  Allergies  Allergen Reactions  . Metoprolol Other (See Comments)    Bradycardia     Patient Measurements Wt= 106kg  Vital Signs: Temp: 98.8 F (37.1 C) (03/28 1600) BP: 129/61 mmHg (03/28 1550) Pulse Rate: 84 (03/28 1550)  Labs: No results found for this basename: HGB, HCT, PLT, APTT, LABPROT, INR, HEPARINUNFRC, CREATININE, CKTOTAL, CKMB, TROPONINI,  in the last 72 hours  The CrCl is unknown because both a height and weight (above a minimum accepted value) are required for this calculation.   Medical History: Past Medical History  Diagnosis Date  . Rotator cuff injury   . Fatigue   . Edema   . H/O: hysterectomy   . Hyperlipidemia   . Depression   . Anxiety   . Syncope   . Chest pain     Normal cardiac cath 5/09  . Dumping syndrome   . Lumbar spondylosis   . Cervical spondylosis with myelopathy   . Hypertension   . Fibromyalgia   . PONV (postoperative nausea and vomiting)   . Restless leg syndrome   . Hypothyroidism   . Anemia   . Shortness of breath   . Tremors of nervous system     Medications:  Prescriptions prior to admission  Medication Sig Dispense Refill  . ALPRAZolam (XANAX) 0.5 MG tablet Take by mouth as directed.      Marland Kitchen amLODipine (NORVASC) 5 MG tablet Take by mouth Daily.      Marland Kitchen amoxicillin (AMOXIL) 500 MG capsule Take 500 mg by mouth 3 (three) times daily.      . calcium carbonate (OS-CAL) 600 MG TABS Take 600 mg by mouth 2 (two) times daily with a meal.      . dexlansoprazole (DEXILANT) 60 MG capsule Take 60 mg by mouth as directed.      . DULoxetine (CYMBALTA) 30 MG capsule Take 30 mg by mouth daily.      . furosemide (LASIX) 40 MG tablet Take 40 mg by mouth daily. Patient uses this medication Mon, Tues., and Wed.      Marland Kitchen levothyroxine (SYNTHROID, LEVOTHROID) 50 MCG tablet Take 50 mcg by mouth daily before breakfast.      . pramipexole  (MIRAPEX) 0.25 MG tablet Take 0.25 mg by mouth at bedtime and may repeat dose one time if needed.      Marland Kitchen spironolactone (ALDACTONE) 25 MG tablet Take 25 mg by mouth Twice daily.      . Vitamin D, Ergocalciferol, (DRISDOL) 50000 UNITS CAPS Take 50,000 Units by mouth every 7 (seven) days. Patient takes this medication on Tuesday.      . [DISCONTINUED] aspirin 81 MG chewable tablet Chew 81 mg by mouth daily.      . [DISCONTINUED] HYDROcodone-acetaminophen (VICODIN) 5-500 MG per tablet Take 1-2 tablets by mouth every 6 (six) hours as needed for pain.        Scheduled:  . [COMPLETED] acetaminophen      . acetaminophen  1,000 mg Intravenous Q6H  . amLODipine  5 mg Oral Daily  . calcium carbonate  600 mg Oral BID WC  .  ceFAZolin (ANCEF) IV  1 g Intravenous Q6H  . [COMPLETED]  ceFAZolin (ANCEF) IV  2 g Intravenous On Call to OR  . docusate sodium  100 mg Oral BID  . [START ON 11/19/2012] DULoxetine  30 mg Oral Daily  . [START ON 11/19/2012] enoxaparin (LOVENOX) injection  30 mg Subcutaneous Q12H  . [COMPLETED] fentaNYL      . [START ON 11/21/2012] furosemide  40 mg Oral Custom  . HYDROmorphone      . HYDROmorphone      . HYDROmorphone      . [START ON 11/19/2012] levothyroxine  50 mcg Oral QAC breakfast  . [COMPLETED] midazolam      . [START ON 11/19/2012] pantoprazole  40 mg Oral Daily  . pramipexole  0.25 mg Oral QHS,MR X 1  . spironolactone  25 mg Oral BID  . [START ON 11/22/2012] Vitamin D (Ergocalciferol)  50,000 Units Oral Q7 days  . [DISCONTINUED] acetaminophen  1,000 mg Intravenous Q6H    Assessment: 67 yo female s/p R TKA to start lovenox to VTE prophylaxis. SCr= 1.18 and CrCl ~ 50- 60 ml/min   Plan:  -Lovenox 30mg  q12hr beginning am of 11/19/12. -CBC every 3 days -Will defer length of therapy to MD  Harland German, Pharm D 11/18/2012 5:38 PM

## 2012-11-18 NOTE — Anesthesia Preprocedure Evaluation (Addendum)
Anesthesia Evaluation  Patient identified by MRN, date of birth, ID band Patient awake    Reviewed: Allergy & Precautions, H&P , NPO status , Patient's Chart, lab work & pertinent test results  History of Anesthesia Complications (+) PONV  Airway Mallampati: II TM Distance: >3 FB Neck ROM: Full    Dental  (+) Edentulous Upper, Dental Advisory Given and Partial Lower   Pulmonary shortness of breath and with exertion,  11-10-12  CHEST - 2 VIEW   Comparison: Chest x-ray 01/21/2011.   Findings: Lung volumes are normal.  No consolidative airspace disease.  No pleural effusions.  No pneumothorax.  No pulmonary nodule or mass noted.  Pulmonary vasculature and the cardiomediastinal silhouette are within normal limits. Atherosclerotic calcifications are noted within the arch of the aorta.   IMPRESSION: 1. No radiographic evidence of acute cardiopulmonary disease. 2.  Atherosclerosis.    breath sounds clear to auscultation  Pulmonary exam normal       Cardiovascular Exercise Tolerance: Poor hypertension, Pt. on medications Rhythm:Regular Rate:Normal     Neuro/Psych PSYCHIATRIC DISORDERS Anxiety Depression Resting tremor " hands and legs" See [facility]  Neuromuscular disease    GI/Hepatic Neg liver ROS, GERD-  Medicated and Controlled,  Endo/Other  Hypothyroidism   Renal/GU   negative genitourinary   Musculoskeletal  (+) Fibromyalgia -  Abdominal   Peds  Hematology negative hematology ROS (+)   Anesthesia Other Findings   Reproductive/Obstetrics negative OB ROS                        Anesthesia Physical Anesthesia Plan  ASA: III  Anesthesia Plan: General   Post-op Pain Management:    Induction: Intravenous  Airway Management Planned: LMA and Oral ETT  Additional Equipment:   Intra-op Plan:   Post-operative Plan:   Informed Consent: I have reviewed the patients History  and Physical, chart, labs and discussed the procedure including the risks, benefits and alternatives for the proposed anesthesia with the patient or authorized representative who has indicated his/her understanding and acceptance.   Dental advisory given  Plan Discussed with: CRNA and Surgeon  Anesthesia Plan Comments: (DJD R. Knee GERD Htn H/O syncope and dyspnea. Cardiac cath (-) normal LV and no valvular pathology by echo  Plan GA with LMA and FNB  Kipp Brood, MD)      Anesthesia Quick Evaluation

## 2012-11-18 NOTE — Transfer of Care (Signed)
Immediate Anesthesia Transfer of Care Note  Patient: [name]  Procedure(s) Performed: Procedure(s): TOTAL KNEE ARTHROPLASTY (Right)  Patient Location: PACU  Anesthesia Type:General  Level of Consciousness: awake, alert , oriented and patient cooperative  Airway & Oxygen Therapy: Patient Spontanous Breathing and Patient connected to nasal cannula oxygen  Post-op Assessment: Report given to PACU RN and Post -op Vital signs reviewed and stable  Post vital signs: Reviewed and stable  Complications: No apparent anesthesia complications

## 2012-11-18 NOTE — Evaluation (Signed)
Physical Therapy Evaluation Patient Details Name: [name] MRN: [id number] DOB: 1946/03/26 Today's Date: 11/18/2012 Time: 4098-1191 PT Time Calculation (min): 36 min  PT Assessment / Plan / Recommendation Clinical Impression  Patient is a 67 yo female s/p Rt. TKA.  Able to perform stand pivot transfer today with assist.  Will benefit from acute PT to maximize independence prior to discharge home with family.  Recommend HHPT at discharge for continued therapy.    PT Assessment  Patient needs continued PT services    Follow Up Recommendations  Home health PT;Supervision/Assistance - 24 hour    Does the patient have the potential to tolerate intense rehabilitation      Barriers to Discharge None      Equipment Recommendations  None recommended by PT    Recommendations for Other Services     Frequency 7X/week    Precautions / Restrictions Precautions Precautions: Knee;Fall Precaution Booklet Issued: Yes (comment) Precaution Comments: Provided handout and precaution education to patient. Required Braces or Orthoses: Knee Immobilizer - Right Knee Immobilizer - Right: On when out of bed or walking Restrictions Weight Bearing Restrictions: Yes RLE Weight Bearing: Weight bearing as tolerated   Pertinent Vitals/Pain       Mobility  Bed Mobility Bed Mobility: Supine to Sit;Sitting - Scoot to Edge of Bed Supine to Sit: 4: Min assist;HOB elevated Sitting - Scoot to [place] of Bed: 4: Min assist Details for Bed Mobility Assistance: Instructed patient and daughter to don KI on RLE.  Verbal cues for technique.  Assist to move RLE off of bed.  Patient sat at EOB x 5 minutes with good balance. Transfers Transfers: Sit to Stand;Stand to Sit;Stand Pivot Transfers Sit to Stand: 1: +2 Total assist;With upper extremity assist;From elevated surface;From bed Sit to Stand: Patient Percentage: 70% Stand to Sit: 3: Mod assist;With upper extremity assist;With armrests;To  chair/3-in-1 Stand Pivot Transfers: 1: +2 Total assist Stand Pivot Transfers: Patient Percentage: 70% Details for Transfer Assistance: Verbal cues for hand placement.  Elevated bed for standing.  Assist to rise to standing.  Patient able to take several steps to pivot to chair.  Assist to control descent to chair. Ambulation/Gait Ambulation/Gait Assistance: Not tested (comment)    Exercises Total Joint Exercises Ankle Circles/Pumps: AROM;Both;10 reps;Supine   PT Diagnosis: Difficulty walking;Generalized weakness;Acute pain  PT Problem List: Decreased strength;Decreased range of motion;Decreased activity tolerance;Decreased balance;Decreased mobility;Decreased knowledge of use of DME;Decreased knowledge of precautions;Obesity;Pain PT Treatment Interventions: DME instruction;Gait training;Stair training;Functional mobility training;Therapeutic exercise;Patient/family education   PT Goals Acute Rehab PT Goals PT Goal Formulation: With patient/family Time For Goal Achievement: 11/25/12 Potential to Achieve Goals: Good Pt will go Supine/Side to Sit: Independently;with HOB 0 degrees PT Goal: Supine/Side to Sit - Progress: Goal set today Pt will go Sit to Supine/Side: Independently;with HOB 0 degrees PT Goal: Sit to Supine/Side - Progress: Goal set today Pt will go Sit to Stand: with supervision;with upper extremity assist PT Goal: Sit to Stand - Progress: Goal set today Pt will Ambulate: 51 - 150 feet;with supervision;with rolling walker PT Goal: Ambulate - Progress: Goal set today Pt will Go Up / Down Stairs: 1-2 stairs;with min assist;with least restrictive assistive device PT Goal: Up/Down Stairs - Progress: Goal set today Pt will Perform Home Exercise Program: with supervision, verbal cues required/provided PT Goal: Perform Home Exercise Program - Progress: Goal set today  Visit Information  Last PT Received On: 11/18/12 Assistance Needed: +2    Subjective Data  Subjective: "I have  fallen  at home." Patient Stated Goal: To be able to return home.   Prior Functioning  Home Living Lives With: Alone Available Help at Discharge: Family;Available 24 hours/day (Daughter, 2 granddaughters, daughter-in-law, and sister) Type of Home: House Home Access: Stairs to enter [employer] of Steps: 2 deep steps (like 2 curbs) Entrance Stairs-Rails: None Home Layout: One level Bathroom Shower/Tub: Health visitor: Handicapped height Bathroom Accessibility: Yes How Accessible: Accessible via walker Home Adaptive Equipment: Bedside commode/3-in-1;Built-in shower seat;Shower chair with back;Walker - rolling;Grab bars in shower Prior Function Level of Independence: Independent Able to Take Stairs?: Yes Driving: Yes Vocation: Retired Comments: Daughter is Nsg Asst at [employer]: No difficulties    Cognition  Cognition Overall Cognitive Status: Appears within functional limits for tasks assessed/performed Arousal/Alertness: Awake/alert Orientation Level: Oriented X4 / Intact Behavior During Session: WFL for tasks performed    Extremity/Trunk Assessment Right Upper Extremity Assessment RUE ROM/Strength/Tone: Deficits RUE ROM/Strength/Tone Deficits: Rt shoulder weakness and decreased ROM - h/o rotator cuff injury RUE Sensation: WFL - Light Touch Left Upper Extremity Assessment LUE ROM/Strength/Tone: WFL for tasks assessed LUE Sensation: WFL - Light Touch Right Lower Extremity Assessment RLE ROM/Strength/Tone: Deficits;Unable to fully assess;Due to pain RLE ROM/Strength/Tone Deficits: Able to assist with moving RLE off of bed. Left Lower Extremity Assessment LLE ROM/Strength/Tone: WFL for tasks assessed LLE Sensation: WFL - Light Touch   Balance Balance Balance Assessed: Yes Static Sitting Balance Static Sitting - Balance Support: No upper extremity supported;Feet supported Static Sitting - Level of Assistance: 5: Stand by  assistance Static Sitting - Comment/# of Minutes: 5 Static Standing Balance Static Standing - Balance Support: Bilateral upper extremity supported Static Standing - Level of Assistance: 4: Min assist  End of Session PT - End of Session Equipment Utilized During Treatment: Gait belt;Right knee immobilizer Activity Tolerance: Patient limited by pain;Patient limited by fatigue Patient left: in chair;with call bell/phone within reach;with family/visitor present Nurse Communication: Mobility status CPM Right Knee CPM Right Knee: Off  GP     Vena Austria 11/18/2012, 8:01 PM  Durenda Hurt. Renaldo Fiddler, [facility] Acute Rehab Services Pager [phone]

## 2012-11-18 NOTE — H&P (View-Only) (Signed)
TOTAL KNEE ADMISSION H&P  Patient is being admitted for right total knee arthroplasty.  Subjective:  Chief Complaint:right knee pain.  HPI: [name], 67 y.o. female, has a history of pain and functional disability in the right knee due to arthritis and has failed non-surgical conservative treatments for greater than 12 weeks to includeNSAID's and/or analgesics, corticosteriod injections, viscosupplementation injections, use of assistive devices and activity modification.  Onset of symptoms was gradual, starting 8 years ago with gradually worsening course since that time. The patient noted prior procedures on the knee to include  menisectomy arthroscopy on the right knee(s).  Patient currently rates pain in the right knee(s) at 8 out of 10 with activity. Patient has night pain, worsening of pain with activity and weight bearing, pain that interferes with activities of daily living, pain with passive range of motion, crepitus and joint swelling.  Patient has evidence of periarticular osteophytes and joint space narrowing by imaging studies.  There is no active infection.  Patient Active Problem List   Diagnosis Date Noted  . Dyspnea 05/17/2012  . Peripheral edema 05/17/2012  . Weight gain 05/17/2012  . HTN (hypertension) 05/17/2012   Past Medical History  Diagnosis Date  . Rotator cuff injury   . Fatigue   . Edema   . H/O: hysterectomy   . Hyperlipidemia   . Depression   . Anxiety   . Syncope   . Chest pain     Normal cardiac cath 5/09  . Dumping syndrome   . Lumbar spondylosis   . Cervical spondylosis with myelopathy   . Hypertension   . Fibromyalgia     Past Surgical History  Procedure Laterality Date  . Rotator cuff repair    . Total knee arthroplasty      left  . Appendectomy    . Back surgery      x3  . Total abdominal hysterectomy       (Not in a hospital admission) Allergies  Allergen Reactions  . Metoprolol Other (See Comments)    Bradycardia      History  Substance Use Topics  . Smoking status: Never Smoker   . Smokeless tobacco: Not on file  . Alcohol Use: No    Family History  Problem Relation Age of Onset  . Heart disease Mother   . Heart failure Mother   . Cancer Father   . Cancer Brother   . Cancer Brother   . Cancer Brother   . Heart Problems    . Tremor Maternal Grandmother 90     Review of Systems  Constitutional: Negative.   HENT: Negative.   Eyes: Negative.   Respiratory: Positive for shortness of breath. Negative for cough, hemoptysis, sputum production and wheezing.   Cardiovascular: Positive for leg swelling. Negative for chest pain, palpitations, orthopnea and claudication.  Gastrointestinal: Negative.   Genitourinary: Negative.   Musculoskeletal: Positive for joint pain. Negative for falls.  Skin: Negative.   Neurological: Negative.   Endo/Heme/Allergies: Negative.   Psychiatric/Behavioral: Negative.     Objective:  Physical Exam  Constitutional: She is oriented to person, place, and time. She appears well-developed and well-nourished. No distress.  HENT:  Head: Normocephalic and atraumatic.  Nose: Nose normal.  Eyes: Conjunctivae and EOM are normal. Pupils are equal, round, and reactive to light.  Neck: Normal range of motion. Neck supple.  Cardiovascular: Normal rate, regular rhythm, normal heart sounds and intact distal pulses.   Respiratory: Effort normal and breath sounds normal. No respiratory  distress. She has no wheezes. She exhibits no tenderness.  GI: Soft. Bowel sounds are normal. She exhibits no distension. There is no tenderness.  Musculoskeletal: She exhibits edema.  Pt with significant bilat LE edema non pitting, right knee with joint line tenderness, stable ligamentous testing, patellofemoral crepitus, no calf tenderness, intact DF/PF ankle  Lymphadenopathy:    She has no cervical adenopathy.  Neurological: She is alert and oriented to person, place, and time. No cranial nerve  deficit.  Skin: Skin is warm and dry. No rash noted. No erythema.  Psychiatric: She has a normal mood and affect. Her behavior is normal.    Vital signs in last 24 hours: @VSRANGES @  Labs:   Estimated body mass index is 34.98 kg/(m^2) as calculated from the following:   Height as of 05/17/12: 5\' 8"  (1.727 m).   Weight as of an earlier encounter on 11/08/12: 104.327 kg (230 lb).   Imaging Review Plain radiographs demonstrate severe degenerative joint disease of the right knee(s). The overall alignment isneutral. The bone quality appears to be good for age and reported activity level.  Assessment/Plan:  End stage arthritis, right knee   The patient history, physical examination, clinical judgment of the provider and imaging studies are consistent with end stage degenerative joint disease of the right knee(s) and total knee arthroplasty is deemed medically necessary. The treatment options including medical management, injection therapy arthroscopy and arthroplasty were discussed at length. The risks and benefits of total knee arthroplasty were presented and reviewed. The risks due to aseptic loosening, infection, stiffness, patella tracking problems, thromboembolic complications and other imponderables were discussed. The patient acknowledged the explanation, agreed to proceed with the plan and consent was signed. Patient is being admitted for inpatient treatment for surgery, pain control, PT, OT, prophylactic antibiotics, VTE prophylaxis, progressive ambulation and ADL's and discharge planning. The patient is planning to be discharged home with home health services

## 2012-11-18 NOTE — Progress Notes (Signed)
Orthopedic Tech Progress Note Patient Details:  [name] [facility] June 12, 1946 [id number]  CPM Right Knee CPM Right Knee: On Right Knee Flexion (Degrees): 60 Right Knee Extension (Degrees): 0 Additional Comments: trapeze bar   Cammer, Mickie Bail 11/18/2012, 3:47 PM

## 2012-11-18 NOTE — Anesthesia Procedure Notes (Addendum)
Anesthesia Regional Block:  Femoral nerve block  Pre-Anesthetic Checklist: ,, timeout performed, Correct Patient, Correct Site, Correct Laterality, Correct Procedure, Correct Position, site marked, Risks and benefits discussed,  Surgical consent,  Pre-op evaluation,  At surgeon's request and post-op pain management  Laterality: Right  Prep: chloraprep       Needles:  Injection technique: Single-shot  Needle Type: Echogenic Stimulator Needle      Needle Gauge: 22 and 22 G    Additional Needles:  Procedures: ultrasound guided (picture in chart) Femoral nerve block Narrative:  Start time: 11/18/2012 10:00 AM End time: 11/18/2012 10:10 AM Injection made incrementally with aspirations every 5 mL.  Performed by: Personally   Additional Notes: 30 cc 0.5% marcaine with 1:200 Epi injected without difficulty   Procedure Name: Intubation Date/Time: 11/18/2012 11:27 AM Performed by: Tyrone Nine Pre-anesthesia Checklist: Patient identified, Timeout performed, Emergency Drugs available, Suction available and Patient being monitored Patient Re-evaluated:Patient Re-evaluated prior to inductionOxygen Delivery Method: Circle system utilized Preoxygenation: Pre-oxygenation with 100% oxygen Intubation Type: IV induction Ventilation: Mask ventilation with difficulty and Oral airway inserted - appropriate to patient size Laryngoscope Size: Mac and 3 Grade View: Grade II Tube type: Oral Tube size: 7.5 mm Number of attempts: 1 Airway Equipment and Method: Stylet Placement Confirmation: ETT inserted through vocal cords under direct vision,  breath sounds checked- equal and bilateral and positive ETCO2 Secured at: 22 cm Tube secured with: Tape Dental Injury: Teeth and Oropharynx as per pre-operative assessment

## 2012-11-18 NOTE — Anesthesia Postprocedure Evaluation (Signed)
  Anesthesia Post-op Note  Patient: [name]  Procedure(s) Performed: Procedure(s): TOTAL KNEE ARTHROPLASTY (Right)  Patient Location: PACU  Anesthesia Type:General and GA combined with regional for post-op pain  Level of Consciousness: awake, alert  and oriented  Airway and Oxygen Therapy: Patient Spontanous Breathing and Patient connected to nasal cannula oxygen  Post-op Pain: mild  Post-op Assessment: Post-op Vital signs reviewed, Patient's Cardiovascular Status Stable, Respiratory Function Stable, Patent Airway and Pain level controlled  Post-op Vital Signs: stable  Complications: No apparent anesthesia complications

## 2012-11-18 NOTE — Brief Op Note (Signed)
11/18/2012  1:43 PM  PATIENT:  [name]  67 y.o. female  PRE-OPERATIVE DIAGNOSIS:  OA RIGHT KNEE  POST-OPERATIVE DIAGNOSIS:  OA RIGHT KNEE  PROCEDURE:  Procedure(s): TOTAL KNEE ARTHROPLASTY (Right)  SURGEON:  Surgeon(s) and Role:    * W D Carloyn Manner., MD - Primary  PHYSICIAN ASSISTANT:   ASSISTANTS: Margart Sickles, PA-C   ANESTHESIA:   regional and general  EBL:  Total I/O In: 2150 [I.V.:2150] Out: 50 [Blood:50]  BLOOD ADMINISTERED:none  DRAINS: hemovac right knee self suction  LOCAL MEDICATIONS USED:  NONE  SPECIMEN:  No Specimen  DISPOSITION OF SPECIMEN:  N/A  COUNTS:  YES  TOURNIQUET:   Total Tourniquet Time Documented: Thigh (Right) - 58 minutes Total: Thigh (Right) - 58 minutes   DICTATION: .Other Dictation: Dictation Number unknown  PLAN OF CARE: Admit to inpatient   PATIENT DISPOSITION:  PACU - hemodynamically stable.   Delay start of Pharmacological VTE agent (>24hrs) due to surgical blood loss or risk of bleeding: yes

## 2012-11-18 NOTE — Preoperative (Signed)
Beta Blockers   Reason not to administer Beta Blockers:Not Applicable 

## 2012-11-19 LAB — BASIC METABOLIC PANEL
Calcium: 9.3 mg/dL (ref 8.4–10.5)
GFR calc Af Amer: 59 mL/min — ABNORMAL LOW (ref >90)
GFR calc non Af Amer: 51 mL/min — ABNORMAL LOW (ref >90)
Glucose, Bld: 124 mg/dL — ABNORMAL HIGH (ref 70–99)
Potassium: 4.6 mEq/L (ref 3.5–5.1)
Sodium: 135 mEq/L (ref 135–145)

## 2012-11-19 LAB — CBC
Hemoglobin: 9.4 g/dL — ABNORMAL LOW (ref 12.0–15.0)
MCH: 29.1 pg (ref 26.0–34.0)
MCV: 86.7 fL (ref 78.0–100.0)
RBC: 3.23 MIL/uL — ABNORMAL LOW (ref 3.87–5.11)
WBC: 9.4 10*3/uL (ref 4.0–10.5)

## 2012-11-19 MED ORDER — HYDROMORPHONE HCL PF 1 MG/ML IJ SOLN
1.0000 mg | INTRAMUSCULAR | Status: DC | PRN
  Administered 2012-11-19 – 2012-11-20 (×4): 2 mg via INTRAVENOUS
  Administered 2012-11-20 (×2): 1 mg via INTRAVENOUS
  Administered 2012-11-20: 2 mg via INTRAVENOUS
  Filled 2012-11-19 (×4): qty 2
  Filled 2012-11-19 (×2): qty 1
  Filled 2012-11-19: qty 2

## 2012-11-19 MED ORDER — CALCIUM CARBONATE 1250 (500 CA) MG PO TABS
1.0000 | ORAL_TABLET | Freq: Two times a day (BID) | ORAL | Status: DC
  Administered 2012-11-19 – 2012-11-21 (×4): 500 mg via ORAL
  Filled 2012-11-19 (×8): qty 1

## 2012-11-19 MED ORDER — HYDROCODONE-ACETAMINOPHEN 10-325 MG PO TABS
1.0000 | ORAL_TABLET | ORAL | Status: DC | PRN
  Administered 2012-11-19 (×2): 2 via ORAL
  Administered 2012-11-19: 1 via ORAL
  Administered 2012-11-20 – 2012-11-21 (×5): 2 via ORAL
  Filled 2012-11-19 (×8): qty 2

## 2012-11-19 NOTE — Progress Notes (Signed)
Occupational Therapy   Order received, reviewed chart/spoke with PT.  No acute OT needs identified.  Will sign off.   Jeani Hawking, OTR/L [phone]

## 2012-11-19 NOTE — Clinical Social Work Note (Signed)
CSW consult for SNF. PT currently recommending HHPT and plan is for pt to d/c home with family. CSW signing off as no other CSW needs identified.  Dellie Burns, MSW, LCSWA [phone] (Weekends 8:00am-4:30pm)

## 2012-11-19 NOTE — Progress Notes (Signed)
PT Cancellation Note  Patient Details Name: [name] MRN: [id number] DOB: [birth year]   Cancelled Treatment:    Reason Eval/Treat Not Completed: Pain limiting ability to participate   Olivia Canter 11/19/2012, 2:29 PM

## 2012-11-19 NOTE — Progress Notes (Signed)
Physical Therapy Treatment Patient Details Name: [name] MRN: [id number] DOB: June 15, 1946 Today's Date: 11/19/2012 Time: 0940-1010 PT Time Calculation (min): 30 min  PT Assessment / Plan / Recommendation Comments on Treatment Session  patient continues to improve with mobility.  patient gaining independence with mobility.  Will need continued PT to reach max potential.    Follow Up Recommendations  Home health PT;Supervision/Assistance - 24 hour     Does the patient have the potential to tolerate intense rehabilitation     Barriers to Discharge        Equipment Recommendations  None recommended by PT    Recommendations for Other Services    Frequency 7X/week   Plan Discharge plan remains appropriate;Frequency remains appropriate    Precautions / Restrictions Precautions Precautions: Knee;Fall Required Braces or Orthoses: Knee Immobilizer - Right Knee Immobilizer - Right: On when out of bed or walking Restrictions RLE Weight Bearing: Weight bearing as tolerated   Pertinent Vitals/Pain Pain = 6/10 during therapy.      Mobility  Bed Mobility Supine to Sit: 4: Min assist;HOB elevated Sitting - Scoot to Edge of Bed: 5: Supervision Details for Bed Mobility Assistance: required assistance for LE's Transfers Transfers: Sit to Stand;Stand to Sit Sit to Stand: 4: Min assist;From elevated surface;With armrests;From bed Stand to Sit: 4: Min guard;With upper extremity assist;To chair/3-in-1 Ambulation/Gait Ambulation/Gait Assistance: 4: Min guard Ambulation Distance (Feet): 80 Feet Assistive device: Rolling walker Ambulation/Gait Assistance Details: ambulated very "stiff legged" - unsure if this is due to dressing or tremors or other.  Will monitor once bulky dressing removed. Gait Pattern: Step-to pattern    Exercises Total Joint Exercises Ankle Circles/Pumps: AROM;Both;10 reps;Supine Quad Sets: AROM;Right;10 reps;Supine Gluteal Sets: AROM;Both;10 reps;Supine Short  Arc Quad: AAROM;Right;10 reps;Supine Heel Slides: AAROM;Right;10 reps;Supine Straight Leg Raises: AAROM;Right;10 reps;Supine Long Arc Quad: AAROM;Right;10 reps;Seated Knee Flexion: AROM;Right;5 reps;Seated Goniometric ROM: knee flex = 84   PT Diagnosis:    PT Problem List:   PT Treatment Interventions:     PT Goals Acute Rehab PT Goals PT Goal: Supine/Side to Sit - Progress: Progressing toward goal PT Goal: Sit to Stand - Progress: Progressing toward goal PT Goal: Ambulate - Progress: Progressing toward goal PT Goal: Perform Home Exercise Program - Progress: Progressing toward goal  Visit Information  Last PT Received On: 11/19/12 Assistance Needed: +1    Subjective Data  Subjective: I don't need OT; this is my second knee surgery and I have everything at home.   Cognition  Cognition Overall Cognitive Status: Appears within functional limits for tasks assessed/performed Arousal/Alertness: Awake/alert Orientation Level: Appears intact for tasks assessed Behavior During Session: [facility] for tasks performed    Balance     End of Session PT - End of Session Equipment Utilized During Treatment: Gait belt;Right knee immobilizer Activity Tolerance: Patient tolerated treatment well Patient left: in chair;with call bell/phone within reach;with family/visitor present   GP     Olivia Canter, [place] [phone] 11/19/2012, 10:29 AM

## 2012-11-19 NOTE — Progress Notes (Signed)
Seen in room 5N02  Vital signs stable, afebrile  S: c/o itching.  This started before she had IV dilaudid.  The only new drug she has had is oxycodone.  She has been taking hydrocodone at home  O: seems fairly comfortable in bed  A: oxycodone mild allerty  P:  Will stop oxycodone and start Norco.  She will use IV dilaudid for breakthrough pain

## 2012-11-19 NOTE — Op Note (Signed)
NAMECOLA, GANE NO.:  [id number]  MEDICAL RECORD NO.:  [id number]  LOCATION:  5N02C                        FACILITY:  [facility]  PHYSICIAN:  Dyke Brackett, M.D.    DATE OF BIRTH:  [birth year]  DATE OF PROCEDURE:  11/18/2012 DATE OF DISCHARGE:                              OPERATIVE REPORT   PREOPERATIVE DIAGNOSIS:  Severe osteoarthritis, right knee.  POSTOPERATIVE DIAGNOSIS:  Severe osteoarthritis, right knee.  OPERATION:  Right total knee replacement through the Sigma cemented knee, size 3 femur, tibia, 12.5-mm bearing with 35-mm patella.  SURGEON:  Dyke Brackett, M.D.  ASSISTANT:  Margart Sickles, PA-C  ANESTHESIA:  General with nerve block.  TOURNIQUET TIME:  57 minutes.  DESCRIPTION OF PROCEDURE:  Sterile prep and drape, exsanguination of the leg, inflation of tourniquet to 375 mmHg.  Straight skin incision with medial parapatellar approach was made to the knee.  We identified the medial compartment, cut 4-mm below it.  Prior to this, we placed 11-mm distal femoral cut at 5-degree valgus and inclination.  Extension gap eventually measured 12.5 mm.  Excess menisci were removed.  We then sized the femur to be a size 3 with the appropriate jig, created external rotation and a flexion gap of 12.5 mm.  This allowed placement of the two pins referencing the cutting guide to the anterior, posterior and chamfer cuts.  These were accomplished without difficulty.  Excess menisci and PCL were released.  The flexion gap again checked, equal to extension gap 12.5 mm.  Kevin Fenton was cut for the tibia.  We then cut the box for the femur. Trial femur and tibia were placed, obtained full extension, excellent range of motion, no tendency for bearing spin out.  Patella was cut leaving about 16 mm of native patella for 35-mm all poly patella.  Trial patella was placed as well.  Full extension noted on the table, good stability varus and valgus and again no bearing  instability noted. Trial components were removed.  The final components were inserted with the exception of the bearing, this was done with two batches of cement. Cement was allowed to harden.  Excess cement was removed.  Once we had the trial bearing, and it was removed, no excess cement was noted in the posterior aspect of the knee.  Tourniquet was then released.  No excess bleeding noted.  Small bleeders were coagulated.  Final poly was placed followed by drain exiting superolaterally, closure was affected with #1 Ethibond, 0, 2-0 Vicryl, and skin clips.  Taken to the recovery room in stable condition.  Lightly compressive dressing and knee immobilizer applied.     Dyke Brackett, M.D.     WDC/MEDQ  D:  11/18/2012  T:  11/19/2012  Job:  [id number]

## 2012-11-20 LAB — CBC
MCH: 29.8 pg (ref 26.0–34.0)
Platelets: 291 10*3/uL (ref 150–400)
RBC: 3.12 MIL/uL — ABNORMAL LOW (ref 3.87–5.11)

## 2012-11-20 MED ORDER — DULOXETINE HCL 30 MG PO CPEP
30.0000 mg | ORAL_CAPSULE | Freq: Two times a day (BID) | ORAL | Status: DC
  Administered 2012-11-20 – 2012-11-21 (×2): 30 mg via ORAL
  Filled 2012-11-20 (×3): qty 1

## 2012-11-20 MED ORDER — ALPRAZOLAM 0.5 MG PO TABS
0.5000 mg | ORAL_TABLET | Freq: Every day | ORAL | Status: DC
  Administered 2012-11-20: 0.5 mg via ORAL
  Filled 2012-11-20: qty 1

## 2012-11-20 NOTE — Progress Notes (Signed)
Physical Therapy Treatment Patient Details Name: [name] MRN: [id number] DOB: 20-Feb-1946 Today's Date: 11/20/2012 Time:  -     PT Assessment / Plan / Recommendation Comments on Treatment Session  Pt pleasant & willing to participate.   Overall moving well but fatigues quickly with ambulation.  Able to trial 1 step/2x's this session but recommend practicing again before d/cing home tomorrow.      Follow Up Recommendations  Home health PT;Supervision/Assistance - 24 hour     Does the patient have the potential to tolerate intense rehabilitation     Barriers to Discharge        Equipment Recommendations  None recommended by PT    Recommendations for Other Services    Frequency 7X/week   Plan Discharge plan remains appropriate;Frequency remains appropriate    Precautions / Restrictions Precautions Precautions: Knee;Fall Required Braces or Orthoses: Knee Immobilizer - Right Knee Immobilizer - Right: On when out of bed or walking Restrictions RLE Weight Bearing: Weight bearing as tolerated       Mobility  Bed Mobility Bed Mobility: Supine to Sit;Sitting - Scoot to Edge of Bed;Sit to Supine Supine to Sit: 4: Min assist;HOB flat Sitting - Scoot to Edge of Bed: 5: Supervision Sit to Supine: 4: Min assist;HOB flat Details for Bed Mobility Assistance: (A) for R LE management.   Transfers Transfers: Sit to Stand;Stand to Sit Sit to Stand: 4: Min assist;With upper extremity assist;From bed (ortho gym mat table) Stand to Sit: 4: Min assist;With upper extremity assist;To bed;Other (comment);4: Min guard (ortho gym mat table) Details for Transfer Assistance: (A) to achieve standing, (A) to control descent to lower surface (ortho gym mat table).  Cues for hand placement & R LE positioning before sitting.   Ambulation/Gait Ambulation/Gait Assistance: 4: Min guard Ambulation Distance (Feet): 100 Feet Assistive device: Rolling walker Ambulation/Gait Assistance Details: cues for  tall posture, look ahead, body positioning inside RW, & increased step/stride length.   Gait Pattern: Step-to pattern;Decreased step length - left;Decreased stance time - right;Decreased weight shift to right;Decreased hip/knee flexion - right;Decreased hip/knee flexion - left Stairs: Yes Stairs Assistance: 4: Min assist Stairs Assistance Details (indicate cue type and reason): Cues for technique.  Instructed pt to perform step backwards to allow for more leverage through UE's with ascending step.   (A) for balance & safety.   Stair Management Technique: No rails;Step to pattern;Backwards;With walker Number of Stairs: 1 (3x's) Wheelchair Mobility Wheelchair Mobility: No      PT Goals Acute Rehab PT Goals Time For Goal Achievement: 11/25/12 Potential to Achieve Goals: Good Pt will go Supine/Side to Sit: Independently;with HOB 0 degrees PT Goal: Supine/Side to Sit - Progress: Progressing toward goal Pt will go Sit to Supine/Side: Independently;with HOB 0 degrees PT Goal: Sit to Supine/Side - Progress: Progressing toward goal Pt will go Sit to Stand: with supervision;with upper extremity assist PT Goal: Sit to Stand - Progress: Progressing toward goal Pt will Ambulate: 51 - 150 feet;with supervision;with rolling walker PT Goal: Ambulate - Progress: Progressing toward goal Pt will Go Up / Down Stairs: 1-2 stairs;with min assist;with least restrictive assistive device PT Goal: Up/Down Stairs - Progress: Progressing toward goal Pt will Perform Home Exercise Program: with supervision, verbal cues required/provided  Visit Information  Last PT Received On: 11/20/12 Assistance Needed: +1    Subjective Data      Cognition  Cognition Overall Cognitive Status: Appears within functional limits for tasks assessed/performed Arousal/Alertness: Awake/alert Orientation Level: Appears intact for tasks  assessed Behavior During Session: [facility] for tasks performed    Balance     End of Session PT -  End of Session Equipment Utilized During Treatment: Gait belt;Right knee immobilizer Activity Tolerance: Patient tolerated treatment well Patient left: in bed;with call bell/phone within reach;Other (comment);with family/visitor present (In CPM) Nurse Communication: Mobility status     Verdell Face, [place] [phone] 11/20/2012

## 2012-11-20 NOTE — Progress Notes (Signed)
Seen in room 5N02  Afebrile, vital signs stable  S: c/o +++itching, although she is now no longer on oxycodone, she is under the impression that the benadryl has been discontinued, which it has not  O: In dressing on CPM machine, actively scratching her other leg an arms  P:  She should be administered benadryl.  Change dressing asnd remove drain

## 2012-11-20 NOTE — Progress Notes (Signed)
Utilization Review Completed.[name] T3/30/2014  

## 2012-11-20 NOTE — Progress Notes (Signed)
PT Cancellation Note  Patient Details Name: MIKAELAH TROSTLE MRN: [id number] DOB: 11/27/1945   Cancelled Treatment:     Pt declining 2nd therapy session this PM due to pain & daughter just getting pt positioned back in CPM.  RN notified.       Verdell Face, [place] [phone] 11/20/2012

## 2012-11-21 ENCOUNTER — Encounter (HOSPITAL_COMMUNITY): Payer: Self-pay | Admitting: Orthopedic Surgery

## 2012-11-21 LAB — CBC
HCT: 26.7 % — ABNORMAL LOW (ref 36.0–46.0)
MCHC: 34.1 g/dL (ref 30.0–36.0)
MCV: 87 fL (ref 78.0–100.0)
Platelets: 300 10*3/uL (ref 150–400)
RBC: 3.07 MIL/uL — ABNORMAL LOW (ref 3.87–5.11)
RDW: 14.8 % (ref 11.5–15.5)
WBC: 8.5 10*3/uL (ref 4.0–10.5)

## 2012-11-21 MED ORDER — HYDROCODONE-ACETAMINOPHEN 10-325 MG PO TABS: ORAL_TABLET | ORAL | Status: DC

## 2012-11-21 NOTE — Discharge Summary (Signed)
PATIENT ID: [name]        MRN:  [id number]          DOB/AGE: 02/11/1946 / 67 y.o.    DISCHARGE SUMMARY  ADMISSION DATE:    11/18/2012 DISCHARGE DATE:   11/21/2012   ADMISSION DIAGNOSIS: OA RIGHT KNEE    DISCHARGE DIAGNOSIS:  OA RIGHT KNEE    ADDITIONAL DIAGNOSIS: Active Problems:   * No active hospital problems. *  Past Medical History  Diagnosis Date  . Rotator cuff injury   . Fatigue   . Edema   . H/O: hysterectomy   . Hyperlipidemia   . Depression   . Anxiety   . Syncope   . Chest pain     Normal cardiac cath 5/09  . Dumping syndrome   . Lumbar spondylosis   . Cervical spondylosis with myelopathy   . Hypertension   . Fibromyalgia   . PONV (postoperative nausea and vomiting)   . Restless leg syndrome   . Hypothyroidism   . Anemia   . Shortness of breath   . Tremors of nervous system     PROCEDURE: Procedure(s): TOTAL KNEE ARTHROPLASTY Righton 11/18/2012  CONSULTS: none     HISTORY:  See H&P in chart  HOSPITAL COURSE:  [name] is a 67 y.o. admitted on 11/18/2012 and found to have a diagnosis of OA RIGHT KNEE.  After appropriate laboratory studies were obtained  they were taken to the operating room on 11/18/2012 and underwent  Procedure(s): TOTAL KNEE ARTHROPLASTY Right.   They were given perioperative antibiotics:  Anti-infectives   Start     Dose/Rate Route Frequency Ordered Stop   11/18/12 1800  ceFAZolin (ANCEF) IVPB 1 g/50 mL premix     1 g 100 mL/hr over 30 Minutes Intravenous Every 6 hours 11/18/12 1647 11/19/12 0030   11/18/12 0600  ceFAZolin (ANCEF) IVPB 2 g/50 mL premix     2 g 100 mL/hr over 30 Minutes Intravenous On call to O.R. 11/17/12 1442 11/18/12 1136    .  Tolerated the procedure well.  Placed with a foley intraoperatively.  Given Ofirmev at induction and for 24 hours.    POD #1, allowed out of bed to a chair.  PT for ambulation and exercise program.  Foley D/C'd in morning.  IV saline locked.  O2  discontionued.  POD #2, continued PT and ambulation.   Hemovac pulled. .  The remainder of the hospital course was dedicated to ambulation and strengthening.   The patient was discharged on 3 Days Post-Op in  Stable condition.  Blood products given:none  DIAGNOSTIC STUDIES: Recent vital signs: Patient Vitals for the past 24 hrs:  BP Temp Temp src Pulse Resp SpO2  11/21/12 0528 124/51 mmHg 99.6 F (37.6 C) Oral 70 16 94 %  11/20/12 2043 131/53 mmHg 98.4 F (36.9 C) Oral 70 18 100 %  11/20/12 1420 124/53 mmHg 99.5 F (37.5 C) Oral 104 18 96 %       Recent laboratory studies:  Recent Labs  11/18/12 1736 11/19/12 0630 11/20/12 0650 11/21/12 0542  WBC 11.6* 9.4 9.4 8.5  HGB 10.5* 9.4* 9.3* 9.1*  HCT 32.1* 28.0* 27.9* 26.7*  PLT 294 283 291 300    Recent Labs  11/18/12 1736 11/19/12 0630  NA  --  135  K  --  4.6  CL  --  101  CO2  --  24  BUN  --  12  CREATININE 1.10 1.10  GLUCOSE  --  124*  CALCIUM  --  9.3   Lab Results  Component Value Date   INR 1.03 11/10/2012     Recent Radiographic Studies :  Dg Chest 2 View  11/10/2012  *RADIOLOGY REPORT*  Clinical Data: Preoperative evaluation for right-sided knee replacement.  CHEST - 2 VIEW  Comparison: Chest x-ray 01/21/2011.  Findings: Lung volumes are normal.  No consolidative airspace disease.  No pleural effusions.  No pneumothorax.  No pulmonary nodule or mass noted.  Pulmonary vasculature and the cardiomediastinal silhouette are within normal limits. Atherosclerotic calcifications are noted within the arch of the aorta.  IMPRESSION: 1. No radiographic evidence of acute cardiopulmonary disease. 2.  Atherosclerosis.   Original Report Authenticated By: Trudie Reed, M.D.    Mr Brain Wo Contrast  11/18/2012  This examination was performed at [facility]. The interpretation will be provided by [facility].   Original Report Authenticated By: Erskine Speed, M.D.     11/18/2012  [facility]  NEUROIMAGING REPORT   STUDY DATE: 11/17/12 PATIENT NAME: [name] DOB: [birth year] MRN: [id number]  ORDERING CLINICIAN: Joycelyn Schmid, MD  CLINICAL HISTORY: 67 year old female with tremor and voice changes.  EXAM: MRI brain (without) TECHNIQUE: MRI of the brain without contrast was obtained utilizing 5 mm axial slices with T1, T2, T2 flair, SWI and diffusion weighted views.  T1 sagittal and T2 coronal views were obtained. CONTRAST: no IMAGING SITE: [facility] [address] (1.5 Tesla MRI)    FINDINGS:  No abnormal lesions are seen on diffusion-weighted views to suggest acute ischemia. The cortical sulci, fissures and cisterns are normal in size and appearance. Mild enlargement of the sylvian fissures. Lateral, third and fourth ventricle are normal in size and appearance. No extra-axial fluid collections are seen. No evidence of mass effect or midline shift. Mild scattered periventricular and subcortical and pontine chronic chronic small vessel ischemic disease.  On sagittal views the posterior fossa, pituitary gland and corpus callosum are unremarkable. No evidence of intracranial hemorrhage on SWI views. The orbits and their contents, paranasal sinuses and calvarium are notable for post surgical orbits.  Intracranial flow voids are present.   IMPRESSION:  Abnormal MRI brain (without) demonstrating: 1. Mild scattered periventricular and subcortical and pontine chronic chronic small vessel ischemic disease. 2. No acute findings.   INTERPRETING PHYSICIAN:  Suanne Marker, MD Certified in Neurology, Neurophysiology and Neuroimaging  [facility] [address], [address] [place], [place] [address] [phone]    Mr Cervical Spine Wo Contrast  11/18/2012  This examination was performed at [facility]. The interpretation will be provided by [facility].   Original Report  Authenticated By: Erskine Speed, M.D.    11/18/2012  [facility]  NEUROIMAGING REPORT   STUDY DATE: 11/17/12 PATIENT NAME: [name] DOB: 27-Dec-1945 MRN: [id number]  ORDERING CLINICIAN: Joycelyn Schmid, MD  CLINICAL HISTORY: 67 year old female with tremor and voice changes.  EXAM: MRI cervical spine (without)  TECHNIQUE: MRI of the cervical spine was obtained utilizing 3 mm sagittal slices from the posterior fossa down to the T3-4 level with T1, T2 and inversion recovery views. In addition 4 mm axial slices from C2-3 down to T1-2 level were included with T2 and gradient echo views. CONTRAST: no IMAGING SITE: [facility] [address] (1.5 Tesla MRI)    FINDINGS:  On sagittal views  the vertebral bodies have normal height and alignment.  Disc bulging and spondylosis from C4-5 to C6-7. The spinal cord is normal in size and appearance. The posterior fossa, pituitary gland and paraspinal soft tissues are unremarkable.    On axial views: C2-3: no spinal stenosis or foraminal narrowing  C3-4: rightward uncovertebral joint hypertrophy with severe right foraminal stenosis  C4-5: disc bulging with uncovertebral joint hypertrophy with severe right and moderate left foraminal stenosis; mild spinal stenosis C5-6: disc bulging with uncovertebral joint hypertrophy with severe biforaminal foraminal stenosis; mild spinal stenosis C6-7: disc bulging with severe right and moderate left foraminal stenosis C7-T1: no spinal stenosis or foraminal narrowing   Limited views of the soft tissues of the head and neck are unremarkable.   IMPRESSION:  Abnormal MRI cervical spine (without) demonstrating: 1. At C4-5: disc bulging with uncovertebral joint hypertrophy with severe right and moderate left foraminal stenosis; mild spinal stenosis 2. At C5-6: disc bulging with uncovertebral joint hypertrophy with severe biforaminal foraminal stenosis; mild spinal stenosis 3. At C6-7: disc bulging with severe right  and moderate left foraminal stenosis 4. At C3-4: rightward uncovertebral joint hypertrophy with severe right foraminal stenosis  5. No intrinsic spinal cord lesions  INTERPRETING PHYSICIAN:  Suanne Marker, MD Certified in Neurology, Neurophysiology and Neuroimaging  [facility] [address], [address] [place], [place] [address] [phone]     DISCHARGE INSTRUCTIONS:   DISCHARGE MEDICATIONS:     Medication List    STOP taking these medications       aspirin 81 MG chewable tablet     HYDROcodone-acetaminophen 5-500 MG per tablet  Commonly known as:  VICODIN      TAKE these medications       ALPRAZolam 0.5 MG tablet  Commonly known as:  XANAX  Take by mouth as directed.     amLODipine 5 MG tablet  Commonly known as:  NORVASC  Take by mouth Daily.     amoxicillin 500 MG capsule  Commonly known as:  AMOXIL  Take 500 mg by mouth 3 (three) times daily.     calcium carbonate 600 MG Tabs  Commonly known as:  OS-CAL  Take 600 mg by mouth 2 (two) times daily with a meal.     DEXILANT 60 MG capsule  Generic drug:  dexlansoprazole  Take 60 mg by mouth as directed.     DULoxetine 30 MG capsule  Commonly known as:  CYMBALTA  Take 30 mg by mouth daily.     enoxaparin 30 MG/0.3ML injection  Commonly known as:  LOVENOX  Inject 0.3 mLs (30 mg total) into the skin every 12 (twelve) hours.     furosemide 40 MG tablet  Commonly known as:  LASIX  Take 40 mg by mouth daily. Patient uses this medication Mon, Tues., and Wed.     oxyCODONE 5 MG immediate release tablet  Commonly known as:  ROXICODONE  1-2 tabs po q4-6hrs prn pain     pramipexole 0.25 MG tablet  Commonly known as:  MIRAPEX  Take 0.25 mg by mouth at bedtime and may repeat dose one time if needed.     spironolactone 25 MG tablet  Commonly known as:  ALDACTONE  Take 25 mg by mouth Twice daily.     Vitamin D (Ergocalciferol) 50000 UNITS Caps  Commonly known as:  DRISDOL  Take 50,000 Units by  mouth every 7 (seven) days. Patient takes this medication on Tuesday.      ASK your  doctor about these medications       levothyroxine 50 MCG tablet  Commonly known as:  SYNTHROID, LEVOTHROID  Take 50 mcg by mouth daily before breakfast.        FOLLOW UP VISIT:       Follow-up Information   Schedule an appointment as soon as possible for a visit with CAFFREY JR,W D, MD. (to be seen on 12/01/12)    Contact information:   [address] [place] [address] [phone]       DISPOSITION:   Home  CONDITION:  Stable   Margart Sickles 11/21/2012, 8:12 AM

## 2012-11-21 NOTE — Progress Notes (Signed)
Physical Therapy Treatment Patient Details Name: [name] MRN: [id number] DOB: March 03, 1946 Today's Date: 11/21/2012 Time: 5621-3086 PT Time Calculation (min): 25 min  PT Assessment / Plan / Recommendation Comments on Treatment Session       Follow Up Recommendations  Home health PT;Supervision/Assistance - 24 hour     Does the patient have the potential to tolerate intense rehabilitation     Barriers to Discharge        Equipment Recommendations  None recommended by PT    Recommendations for Other Services    Frequency 7X/week   Plan Discharge plan remains appropriate;Frequency remains appropriate    Precautions / Restrictions Precautions Precautions: Knee;Fall Precaution Booklet Issued: Yes (comment) Restrictions Weight Bearing Restrictions: Yes RLE Weight Bearing: Weight bearing as tolerated   Pertinent Vitals/Pain     Mobility  Bed Mobility Supine to Sit: 4: Min assist;HOB flat Sit to Supine: 4: Min assist;HOB flat Details for Bed Mobility Assistance: (A) for R LE management.   Transfers Sit to Stand: 4: Min assist;With upper extremity assist;From bed;4: Min guard;From chair/3-in-1 Stand to Sit: With upper extremity assist;Other (comment);4: Min guard;To chair/3-in-1 Details for Transfer Assistance: Cues for safe hand placement Ambulation/Gait Ambulation/Gait Assistance: 5: Supervision Ambulation Distance (Feet): 130 Feet Assistive device: Rolling walker Stairs Assistance: 4: Min guard Stair Management Technique: No rails;Forwards;With walker Number of Stairs: 1    Exercises Total Joint Exercises Quad Sets: AROM;Right;10 reps;Supine Short Arc Quad: AAROM;Right;10 reps;Supine Heel Slides: AAROM;Right;10 reps;Supine Hip ABduction/ADduction: AAROM;10 reps;Right Straight Leg Raises: AAROM;Right;10 reps;Supine   PT Diagnosis:    PT Problem List:   PT Treatment Interventions:     PT Goals Acute Rehab PT Goals PT Goal: Supine/Side to Sit - Progress:  Progressing toward goal PT Goal: Sit to Supine/Side - Progress: Progressing toward goal PT Goal: Sit to Stand - Progress: Progressing toward goal PT Goal: Ambulate - Progress: Progressing toward goal PT Goal: Up/Down Stairs - Progress: Progressing toward goal PT Goal: Perform Home Exercise Program - Progress: Progressing toward goal  Visit Information  Last PT Received On: 11/21/12 Assistance Needed: +1    Subjective Data      Cognition  Cognition Overall Cognitive Status: Appears within functional limits for tasks assessed/performed Arousal/Alertness: Awake/alert Orientation Level: Appears intact for tasks assessed Behavior During Session: [facility] for tasks performed    Balance     End of Session PT - End of Session Equipment Utilized During Treatment: Gait belt Activity Tolerance: Patient tolerated treatment well Patient left: in bed;with call bell/phone within reach Nurse Communication: Mobility status CPM Right Knee CPM Right Knee: On Right Knee Flexion (Degrees): 60 Right Knee Extension (Degrees): 0   GP     Robinette, Adline Potter 11/21/2012, 11:05 AM  11/21/2012 Fredrich Birks PTA [phone] pager [phone] office

## 2012-11-21 NOTE — Progress Notes (Signed)
Pt provided with discharge instructions and follow up information. Educated on administration of Lovenox injections for blood thinner. Patient is going home with HHPT set up through [facility]

## 2012-11-21 NOTE — Progress Notes (Signed)
Subjective: 3 Days Post-Op Procedure(s) (LRB): TOTAL KNEE ARTHROPLASTY (Right) Patient reports pain as mild.  Itching improved on hydrocodone  Objective: Vital signs in last 24 hours: Temp:  [98.4 F (36.9 C)-99.6 F (37.6 C)] 99.6 F (37.6 C) (03/31 0528) Pulse Rate:  [70-104] 70 (03/31 0528) Resp:  [16-18] 16 (03/31 0528) BP: (124-131)/(51-53) 124/51 mmHg (03/31 0528) SpO2:  [94 %-100 %] 94 % (03/31 0528)  Intake/Output from previous day: 03/30 0701 - 03/31 0700 In: 1920 [P.O.:1920] Out: -  Intake/Output this shift:     Recent Labs  11/18/12 1736 11/19/12 0630 11/20/12 0650 11/21/12 0542  HGB 10.5* 9.4* 9.3* 9.1*    Recent Labs  11/20/12 0650 11/21/12 0542  WBC 9.4 8.5  RBC 3.12* 3.07*  HCT 27.9* 26.7*  PLT 291 300    Recent Labs  11/18/12 1736 11/19/12 0630  NA  --  135  K  --  4.6  CL  --  101  CO2  --  24  BUN  --  12  CREATININE 1.10 1.10  GLUCOSE  --  124*  CALCIUM  --  9.3   No results found for this basename: LABPT, INR,  in the last 72 hours  Neurovascular intact Sensation intact distally Intact pulses distally Dorsiflexion/Plantar flexion intact Incision: dressing C/D/I and scant drainage No cellulitis present Compartment soft  Assessment/Plan: 3 Days Post-Op Procedure(s) (LRB): TOTAL KNEE ARTHROPLASTY (Right) Discharge home with home health lovenox teaching  Margart Sickles 11/21/2012, 8:11 AM

## 2012-11-21 NOTE — Progress Notes (Signed)
CARE MANAGEMENT NOTE 11/21/2012  Patient:  [name], [name]   Account Number:  [id number]  Date Initiated:  11/21/2012  Documentation initiated by:  Vance Peper  Subjective/Objective Assessment:   67 yr old female s/p right total knee arthroplasty.     Action/Plan:   CM spoke with patient concerning home health and DME needs. Choice offered.  Rolling walker, 3in1 and CPM have been delivered to patient's home.   Anticipated DC Date:  11/21/2012   Anticipated DC Plan:  HOME W HOME HEALTH SERVICES      DC Planning Services  CM consult      [facility] Choice  HOME HEALTH   Choice offered to / List presented to:  C-1 Patient        HH arranged  HH-2 PT      [facility] agency  [facility]   Status of service:  Completed, signed off Medicare Important Message given?   (If response is "NO", the following Medicare IM given date fields will be blank) Date Medicare IM given:   Date Additional Medicare IM given:    Discharge Disposition:  HOME W HOME HEALTH SERVICES  Per UR Regulation:    If discussed at Long Length of Stay Meetings, dates discussed:    Comments:

## 2012-11-24 ENCOUNTER — Telehealth: Payer: Self-pay

## 2012-11-24 NOTE — Telephone Encounter (Signed)
Pt requesting results. Says her tremors have increased, and she is having one main jerk every so often.

## 2012-12-15 ENCOUNTER — Ambulatory Visit: Payer: Medicare Other | Attending: Orthopedic Surgery | Admitting: Physical Therapy

## 2012-12-15 DIAGNOSIS — M25669 Stiffness of unspecified knee, not elsewhere classified: Secondary | ICD-10-CM | POA: Insufficient documentation

## 2012-12-15 DIAGNOSIS — M25569 Pain in unspecified knee: Secondary | ICD-10-CM | POA: Insufficient documentation

## 2012-12-15 DIAGNOSIS — M79609 Pain in unspecified limb: Secondary | ICD-10-CM | POA: Insufficient documentation

## 2012-12-15 DIAGNOSIS — R5381 Other malaise: Secondary | ICD-10-CM | POA: Insufficient documentation

## 2012-12-19 ENCOUNTER — Ambulatory Visit: Payer: Medicare Other | Admitting: Physical Therapy

## 2012-12-20 ENCOUNTER — Encounter: Payer: Medicare Other | Admitting: Radiology

## 2012-12-20 ENCOUNTER — Ambulatory Visit (INDEPENDENT_AMBULATORY_CARE_PROVIDER_SITE_OTHER): Payer: Medicare Other | Admitting: Diagnostic Neuroimaging

## 2012-12-20 DIAGNOSIS — R5381 Other malaise: Secondary | ICD-10-CM

## 2012-12-20 DIAGNOSIS — R531 Weakness: Secondary | ICD-10-CM

## 2012-12-20 DIAGNOSIS — R259 Unspecified abnormal involuntary movements: Secondary | ICD-10-CM

## 2012-12-22 ENCOUNTER — Ambulatory Visit: Payer: Medicare Other | Attending: Orthopedic Surgery | Admitting: Physical Therapy

## 2012-12-22 DIAGNOSIS — IMO0001 Reserved for inherently not codable concepts without codable children: Secondary | ICD-10-CM | POA: Insufficient documentation

## 2012-12-22 DIAGNOSIS — R5381 Other malaise: Secondary | ICD-10-CM | POA: Insufficient documentation

## 2012-12-22 DIAGNOSIS — M25669 Stiffness of unspecified knee, not elsewhere classified: Secondary | ICD-10-CM | POA: Insufficient documentation

## 2012-12-22 DIAGNOSIS — M25569 Pain in unspecified knee: Secondary | ICD-10-CM | POA: Insufficient documentation

## 2012-12-26 ENCOUNTER — Ambulatory Visit: Payer: Medicare Other | Admitting: Physical Therapy

## 2012-12-26 NOTE — Procedures (Signed)
   [facility]  NCS (NERVE CONDUCTION STUDY) WITH EMG (ELECTROMYOGRAPHY) REPORT   STUDY DATE: 12/20/12 PATIENT NAME: [name] DOB: 1945-09-09 MRN: [id number]  ORDERING CLINICIAN: Joycelyn Schmid, MD   TECHNOLOGIST: Kaylyn Lim ELECTROMYOGRAPHER: Glenford Bayley. Paiton Boultinghouse, MD  CLINICAL INFORMATION: 67 year old female with tongue fasciculations and bilateral upper extremity weakness.  FINDINGS: NERVE CONDUCTION STUDY: Left median motor response has prolonged distal latency (4.4 ms), borderline decreased amplitude, normal conduction velocity and normal F-wave latency. Left ulnar motor response is normal. Bilateral peroneal motor response has have normal distal latencies, decreased amplitudes, absent responses with stimulation below the knee, and normal F-wave latencies. Right tibial motor response could not be obtained. Left tibial motor response has normal distal latency, decreased amplitude, normal conduction velocity and F-wave latency.   Left median, ulnar, radial sensory responses are normal. Bilateral sural sensory responses normal.  NEEDLE ELECTROMYOGRAPHY: Needle examination of selected muscles of left upper left lower extremity (deltoid, biceps, triceps, flexor carpi radialis, first dorsal interosseous, vastus medialis, tibialis anterior, gastric images) and left C5-6 and C7-T1 paraspinal muscles is normal.  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 No electrodiagnostic evidence of primarily motor neuropathy or motor neuron disease.   INTERPRETING PHYSICIAN:  Suanne Marker, MD Certified in Neurology, Neurophysiology and Neuroimaging  [facility] [address] [place], [place] [address] [phone]

## 2012-12-28 ENCOUNTER — Ambulatory Visit: Payer: Medicare Other | Admitting: Physical Therapy

## 2013-01-02 ENCOUNTER — Ambulatory Visit: Payer: Medicare Other | Admitting: Diagnostic Neuroimaging

## 2013-01-02 ENCOUNTER — Ambulatory Visit: Payer: Medicare Other | Admitting: *Deleted

## 2013-01-03 ENCOUNTER — Telehealth: Payer: Self-pay | Admitting: Diagnostic Neuroimaging

## 2013-01-04 ENCOUNTER — Ambulatory Visit: Payer: Medicare Other | Admitting: *Deleted

## 2013-01-06 ENCOUNTER — Ambulatory Visit: Payer: Medicare Other | Admitting: Physical Therapy

## 2013-01-10 ENCOUNTER — Ambulatory Visit: Payer: Medicare Other | Admitting: Physical Therapy

## 2013-01-12 ENCOUNTER — Ambulatory Visit: Payer: Medicare Other | Admitting: Physical Therapy

## 2013-01-17 ENCOUNTER — Encounter: Payer: Medicare Other | Admitting: Physical Therapy

## 2013-01-30 ENCOUNTER — Ambulatory Visit: Payer: Medicare Other | Attending: Orthopedic Surgery | Admitting: Physical Therapy

## 2013-01-30 DIAGNOSIS — M25569 Pain in unspecified knee: Secondary | ICD-10-CM | POA: Insufficient documentation

## 2013-01-30 DIAGNOSIS — M25669 Stiffness of unspecified knee, not elsewhere classified: Secondary | ICD-10-CM | POA: Insufficient documentation

## 2013-01-30 DIAGNOSIS — R5381 Other malaise: Secondary | ICD-10-CM | POA: Insufficient documentation

## 2013-01-30 DIAGNOSIS — IMO0001 Reserved for inherently not codable concepts without codable children: Secondary | ICD-10-CM | POA: Insufficient documentation

## 2013-02-02 ENCOUNTER — Ambulatory Visit: Payer: Medicare Other | Admitting: Physical Therapy

## 2013-02-07 ENCOUNTER — Ambulatory Visit: Payer: Medicare Other | Admitting: *Deleted

## 2013-02-14 ENCOUNTER — Ambulatory Visit: Payer: Medicare Other | Admitting: Physical Therapy

## 2013-02-16 ENCOUNTER — Ambulatory Visit: Payer: Medicare Other | Admitting: Physical Therapy

## 2013-02-20 ENCOUNTER — Ambulatory Visit: Payer: Medicare Other | Admitting: *Deleted

## 2013-02-22 ENCOUNTER — Ambulatory Visit: Payer: Medicare Other | Attending: Orthopedic Surgery | Admitting: Physical Therapy

## 2013-02-22 DIAGNOSIS — R5381 Other malaise: Secondary | ICD-10-CM | POA: Insufficient documentation

## 2013-02-22 DIAGNOSIS — M25569 Pain in unspecified knee: Secondary | ICD-10-CM | POA: Insufficient documentation

## 2013-02-22 DIAGNOSIS — IMO0001 Reserved for inherently not codable concepts without codable children: Secondary | ICD-10-CM | POA: Insufficient documentation

## 2013-02-22 DIAGNOSIS — M25669 Stiffness of unspecified knee, not elsewhere classified: Secondary | ICD-10-CM | POA: Insufficient documentation

## 2013-02-28 ENCOUNTER — Other Ambulatory Visit: Payer: Self-pay | Admitting: Orthopedic Surgery

## 2013-02-28 ENCOUNTER — Telehealth: Payer: Self-pay

## 2013-02-28 DIAGNOSIS — M19079 Primary osteoarthritis, unspecified ankle and foot: Secondary | ICD-10-CM

## 2013-02-28 NOTE — Telephone Encounter (Signed)
I returned patient call to Ms. Sanborn requesting results of Nerve Conduction Study. 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 I let her know that would be consistent with her extremity weakness and Dr. Marjory Lies will discuss this further at their March 20 2013 appointment at 1:30 p.m. Patient asked that we have Dr. Marjory Lies review her MRI and her lab work. I have printed her MRI. Labs from today are not yet recorded. Patient requested that I call and speak with her daughter, Aurther Loft, which I did.  Camelia Eng is very focused on one need. She wants to assure that her mother can get the most consolidated care she can for her new diagnosis of Aggressive MS that will assure the best quality of life at home for the remainder of her mother's life.  Terri stated that her mother had blood work today. Also a unrine that showed a creatinine of 1.43. She stated that her mother presently had three neurologists:   Dr. Channing Mutters who did back surgery on a bulging disc.  Dr. Laurian Brim who is a pain specialist Most recently, Dr. Phylis Bougie who is an MS specialist  Her mom has a Rheumatologist who she also sees.   Dr. Channing Mutters has bowed out of care and deferred to Dr. Phylis Bougie because of his specialty and because he has a number of other specialists that he works with.  The daughter then asked what kind of doctor Dr. Marjory Lies is. I told her he is also a Insurance account manager and works with a team of Neurology specialists as well who deal with headaches, movement disorders and numerous other specialties. She said she didn't know and this is why she feels she needs to consolidate providers. She also said she was very glad to know we also had a large variety of specialists in our office as well.  Ms. Josaphine Shimamoto has an appointment with Dr. Phylis Bougie on July 24. She would like Dr. Marjory Lies to review her mother's MRI and labs. When they arrive for mother's appointment on July 28th,  Terri would like to discuss her mom's care and how Dr. Marjory Lies thinks he and his team can best meet her mother's needs. Camelia Eng will then be able to decide what is her best avenue for her mother's care. She would like to have only one Neurologist. It is important to her to know that there is a team of specialists that her mother's Neurologist can consult with in one place if available.  I let her know I would thoroughly record this conversation and share with Dr. Marjory Lies.   She asked if we would share all notes with Dr. Joette Catching, Primary Practitioner.I asked if she knew if he was part of the [facility] electronic medical record system. She did not know. I asked her to let me see if he was. I let her know that he was in the system and as such, could pull up all records from our office, labs, imaging or anywhere else within the system to stay on top of her mom's care.  I will forward this information on to Dr. Marjory Lies, I have printed her mom's MRI from this system and will leave that on his desk for his review, although he can look it up himself as well. I will also ask him to review the blood work when that comes in.   Camelia Eng was delighted to hear that we could coordinate care so well. She will e here with her mother for  her scheduled OV.

## 2013-03-02 NOTE — Telephone Encounter (Signed)
Records reviewed. I will discuss further at office visit. -VRP

## 2013-03-06 ENCOUNTER — Ambulatory Visit
Admission: RE | Admit: 2013-03-06 | Discharge: 2013-03-06 | Disposition: A | Discharge status: Home / Self Care | Payer: Medicare Other | Source: Ambulatory Visit | Attending: Orthopedic Surgery | Admitting: Orthopedic Surgery

## 2013-03-06 DIAGNOSIS — M19079 Primary osteoarthritis, unspecified ankle and foot: Secondary | ICD-10-CM

## 2013-03-06 MED ORDER — IOHEXOL 180 MG/ML  SOLN
1.0000 mL | Freq: Once | INTRAMUSCULAR | Status: AC | PRN
  Administered 2013-03-06: 1 mL via INTRA_ARTICULAR

## 2013-03-06 MED ORDER — METHYLPREDNISOLONE ACETATE 40 MG/ML INJ SUSP (RADIOLOG
120.0000 mg | Freq: Once | INTRAMUSCULAR | Status: AC
  Administered 2013-03-06: 120 mg via INTRA_ARTICULAR

## 2013-03-20 ENCOUNTER — Ambulatory Visit: Payer: Medicare Other | Admitting: Diagnostic Neuroimaging

## 2013-05-22 ENCOUNTER — Ambulatory Visit: Payer: Medicare Other | Admitting: Physical Therapy

## 2013-06-12 ENCOUNTER — Other Ambulatory Visit: Payer: Self-pay | Admitting: Orthopedic Surgery

## 2013-06-12 DIAGNOSIS — M79671 Pain in right foot: Secondary | ICD-10-CM

## 2013-06-15 ENCOUNTER — Ambulatory Visit
Admission: RE | Admit: 2013-06-15 | Discharge: 2013-06-15 | Disposition: A | Discharge status: Home / Self Care | Payer: Medicare Other | Source: Ambulatory Visit | Attending: Orthopedic Surgery | Admitting: Orthopedic Surgery

## 2013-06-15 DIAGNOSIS — M79671 Pain in right foot: Secondary | ICD-10-CM

## 2013-06-15 MED ORDER — IOHEXOL 180 MG/ML  SOLN
1.0000 mL | Freq: Once | INTRAMUSCULAR | Status: AC | PRN
  Administered 2013-06-15: 1 mL via INTRA_ARTICULAR

## 2013-06-15 MED ORDER — METHYLPREDNISOLONE ACETATE 40 MG/ML INJ SUSP (RADIOLOG
120.0000 mg | Freq: Once | INTRAMUSCULAR | Status: AC
  Administered 2013-06-15: 120 mg via INTRA_ARTICULAR

## 2013-06-27 ENCOUNTER — Other Ambulatory Visit: Payer: Self-pay | Admitting: Family Medicine

## 2013-06-27 DIAGNOSIS — M810 Age-related osteoporosis without current pathological fracture: Secondary | ICD-10-CM

## 2013-06-27 DIAGNOSIS — Z1231 Encounter for screening mammogram for malignant neoplasm of breast: Secondary | ICD-10-CM

## 2013-08-24 HISTORY — PX: LAPAROSCOPIC CHOLECYSTECTOMY: SUR755

## 2013-08-30 ENCOUNTER — Ambulatory Visit
Admission: RE | Admit: 2013-08-30 | Discharge: 2013-08-30 | Disposition: A | Discharge status: Home / Self Care | Payer: Medicare Other | Source: Ambulatory Visit | Attending: Family Medicine | Admitting: Family Medicine

## 2013-08-30 DIAGNOSIS — M810 Age-related osteoporosis without current pathological fracture: Secondary | ICD-10-CM

## 2013-08-30 DIAGNOSIS — Z1231 Encounter for screening mammogram for malignant neoplasm of breast: Secondary | ICD-10-CM

## 2013-09-11 ENCOUNTER — Other Ambulatory Visit: Payer: Self-pay | Admitting: Orthopedic Surgery

## 2013-09-11 DIAGNOSIS — M1990 Unspecified osteoarthritis, unspecified site: Secondary | ICD-10-CM

## 2013-09-21 ENCOUNTER — Ambulatory Visit
Admission: RE | Admit: 2013-09-21 | Discharge: 2013-09-21 | Disposition: A | Discharge status: Home / Self Care | Payer: Medicare Other | Source: Ambulatory Visit | Attending: Orthopedic Surgery | Admitting: Orthopedic Surgery

## 2013-09-21 DIAGNOSIS — M1990 Unspecified osteoarthritis, unspecified site: Secondary | ICD-10-CM

## 2013-09-21 MED ORDER — METHYLPREDNISOLONE ACETATE 40 MG/ML INJ SUSP (RADIOLOG
120.0000 mg | Freq: Once | INTRAMUSCULAR | Status: AC
  Administered 2013-09-21: 120 mg via INTRA_ARTICULAR

## 2013-09-21 MED ORDER — IOHEXOL 180 MG/ML  SOLN
1.0000 mL | Freq: Once | INTRAMUSCULAR | Status: AC | PRN
  Administered 2013-09-21: 1 mL via INTRA_ARTICULAR

## 2013-10-12 NOTE — Telephone Encounter (Signed)
Pt was called back later in July, closing encounter

## 2014-01-09 DIAGNOSIS — E039 Hypothyroidism, unspecified: Secondary | ICD-10-CM | POA: Insufficient documentation

## 2014-04-02 ENCOUNTER — Other Ambulatory Visit: Payer: Self-pay | Admitting: Orthopedic Surgery

## 2014-04-02 DIAGNOSIS — M19071 Primary osteoarthritis, right ankle and foot: Secondary | ICD-10-CM

## 2014-04-12 ENCOUNTER — Ambulatory Visit
Admission: RE | Admit: 2014-04-12 | Discharge: 2014-04-12 | Disposition: A | Discharge status: Home / Self Care | Payer: Medicare Other | Source: Ambulatory Visit | Attending: Orthopedic Surgery | Admitting: Orthopedic Surgery

## 2014-04-12 DIAGNOSIS — M19071 Primary osteoarthritis, right ankle and foot: Secondary | ICD-10-CM

## 2014-04-12 MED ORDER — METHYLPREDNISOLONE ACETATE 40 MG/ML INJ SUSP (RADIOLOG
120.0000 mg | Freq: Once | INTRAMUSCULAR | Status: AC
  Administered 2014-04-12: 120 mg via INTRA_ARTICULAR

## 2014-04-12 MED ORDER — IOHEXOL 180 MG/ML  SOLN
1.0000 mL | Freq: Once | INTRAMUSCULAR | Status: AC | PRN
  Administered 2014-04-12: 1 mL via INTRA_ARTICULAR

## 2014-07-12 ENCOUNTER — Other Ambulatory Visit: Payer: Self-pay | Admitting: Orthopedic Surgery

## 2014-07-12 DIAGNOSIS — M19071 Primary osteoarthritis, right ankle and foot: Secondary | ICD-10-CM

## 2014-09-18 ENCOUNTER — Inpatient Hospital Stay: Admission: RE | Admit: 2014-09-18 | Payer: Medicare Other | Source: Ambulatory Visit

## 2014-09-27 ENCOUNTER — Other Ambulatory Visit: Payer: Medicare Other

## 2014-10-09 ENCOUNTER — Inpatient Hospital Stay: Admission: RE | Admit: 2014-10-09 | Payer: Medicare Other | Source: Ambulatory Visit

## 2014-10-15 ENCOUNTER — Ambulatory Visit
Admission: RE | Admit: 2014-10-15 | Discharge: 2014-10-15 | Disposition: A | Discharge status: Home / Self Care | Payer: Medicare Other | Source: Ambulatory Visit | Attending: Orthopedic Surgery | Admitting: Orthopedic Surgery

## 2014-10-15 DIAGNOSIS — M19071 Primary osteoarthritis, right ankle and foot: Secondary | ICD-10-CM

## 2014-10-15 MED ORDER — IOHEXOL 180 MG/ML  SOLN
1.0000 mL | Freq: Once | INTRAMUSCULAR | Status: AC | PRN
  Administered 2014-10-15: 1 mL via INTRAVENOUS

## 2014-10-15 MED ORDER — METHYLPREDNISOLONE ACETATE 40 MG/ML INJ SUSP (RADIOLOG
120.0000 mg | Freq: Once | INTRAMUSCULAR | Status: AC
  Administered 2014-10-15: 120 mg via INTRA_ARTICULAR

## 2014-10-29 ENCOUNTER — Encounter: Payer: Self-pay | Admitting: Gastroenterology

## 2014-12-14 ENCOUNTER — Encounter: Payer: Self-pay | Admitting: Gastroenterology

## 2014-12-14 ENCOUNTER — Ambulatory Visit (INDEPENDENT_AMBULATORY_CARE_PROVIDER_SITE_OTHER): Payer: Medicare Other | Admitting: Gastroenterology

## 2014-12-14 VITALS — BP 130/66 | HR 64 | Ht 66.0 in | Wt 246.1 lb

## 2014-12-14 DIAGNOSIS — R194 Change in bowel habit: Secondary | ICD-10-CM | POA: Diagnosis not present

## 2014-12-14 DIAGNOSIS — Z800 Family history of malignant neoplasm of digestive organs: Secondary | ICD-10-CM | POA: Diagnosis not present

## 2014-12-14 DIAGNOSIS — D6489 Other specified anemias: Secondary | ICD-10-CM

## 2014-12-14 DIAGNOSIS — R195 Other fecal abnormalities: Secondary | ICD-10-CM | POA: Diagnosis not present

## 2014-12-14 DIAGNOSIS — K219 Gastro-esophageal reflux disease without esophagitis: Secondary | ICD-10-CM | POA: Insufficient documentation

## 2014-12-14 DIAGNOSIS — D649 Anemia, unspecified: Secondary | ICD-10-CM | POA: Insufficient documentation

## 2014-12-14 MED ORDER — NA SULFATE-K SULFATE-MG SULF 17.5-3.13-1.6 GM/177ML PO SOLN: 1.0000 | Freq: Once | ORAL | Status: DC

## 2014-12-14 MED ORDER — METOCLOPRAMIDE HCL 10 MG PO TABS: 10.0000 mg | ORAL_TABLET | Freq: Once | ORAL | Status: DC

## 2014-12-14 NOTE — Addendum Note (Signed)
Addended by: Oda Kilts on: 12/14/2014 12:30 PM   Modules accepted: Orders

## 2014-12-14 NOTE — Patient Instructions (Addendum)
You have been scheduled for an endoscopy and colonoscopy. Please follow the written instructions given to you at your visit today. Please pick up your prep supplies at the pharmacy within the next 1-3 days. If you use inhalers (even only as needed), please bring them with you on the day of your procedure. Your physician has requested that you go to www.startemmi.com and enter the access code given to you at your visit today. This web site gives a general overview about your procedure. However, you should still follow specific instructions given to you by our office regarding your preparation for the procedure.   Take reglan 10mg  30 minutes before the start of your prep once at 5:30pm on 6/13 and at 8:30am on 6/14

## 2014-12-14 NOTE — Assessment & Plan Note (Signed)
Plan colonoscopy to rule out GI bleeding sources including polyps, AVMs, neoplasm

## 2014-12-14 NOTE — Progress Notes (Signed)
_                                                                                                                History of Present Illness:  [name] is a pleasant 69 year old [place] female with history of pernicious anemia, family history of colon cancer, referred at the request of Dr. Edrick Oh for change in bowel habits and Hemoccult-positive stool.  Over the past year she has developed increasing constipation.  For the past month she's had poorly formed stools about every other day.  She denies abdominal pain or bleeding.  She has intermittent pyrosis and complains of dysphagia to solids.  She takes omeprazole daily.  Recent Hemoccult was positive.  In February, 2016 hemoglobin was 10.1 and MCV 88.  She takes supplementary iron.  [name] brother had colon cancer his 19s.  Last colonoscopy 10 years ago reportedly was normal.   Past Medical History  Diagnosis Date  . Rotator cuff injury   . Fatigue   . Edema   . H/O: hysterectomy   . Hyperlipidemia   . Depression   . Anxiety   . Syncope   . Chest pain     Normal cardiac cath 5/09  . Dumping syndrome   . Lumbar spondylosis   . Cervical spondylosis with myelopathy   . Hypertension   . Fibromyalgia   . PONV (postoperative nausea and vomiting)   . Restless leg syndrome   . Hypothyroidism   . Anemia   . Shortness of breath   . Tremors of nervous system   . Arthritis   . Brain lesion     2 types   Past Surgical History  Procedure Laterality Date  . Rotator cuff repair Bilateral   . Total knee arthroplasty Left   . Appendectomy  1966  . Lumbar disc surgery      x2  . Total abdominal hysterectomy    . Total knee arthroplasty Right 11/18/2012    Procedure: TOTAL KNEE ARTHROPLASTY;  Surgeon: Yvette Rack., MD;  Location: [place];  Service: Orthopedics;  Laterality: Right;  . Knee arthroscopy Bilateral   . Cholecystectomy  2015  . Hip bursa surgery Bilateral   . Lumbar fusion     family history includes Cancer in [name]  brother; Colon cancer in [name] brother; Heart disease in [name] brother and mother; Heart failure in [name] mother; Irritable bowel syndrome in [name] daughter; Kidney disease in [name] mother; Lung cancer in [name] father; Ovarian cancer in [name] maternal grandmother; Tremor (age of onset: 38) in [name] maternal grandmother; Uterine cancer in [name] maternal grandmother. Current Outpatient Prescriptions  Medication Sig Dispense Refill  . alendronate (FOSAMAX) 70 MG tablet Take 70 mg by mouth once a week. Take with a full glass of water on an empty stomach.    . ALPRAZolam (XANAX) 0.5 MG tablet Take 0.5 mg by mouth 2 (two) times daily.     Marland Kitchen amLODipine (NORVASC) 5 MG tablet Take by mouth Daily.    Marland Kitchen  aspirin 81 MG tablet Take 81 mg by mouth at bedtime.    . baclofen (LIORESAL) 10 MG tablet Take 10 mg by mouth 3 (three) times daily.    . calcium carbonate (OS-CAL) 600 MG TABS Take 600 mg by mouth 2 (two) times daily with a meal.    . donepezil (ARICEPT) 10 MG tablet Take 1 tablet by mouth daily.    Marland Kitchen escitalopram (LEXAPRO) 20 MG tablet Take 1.5 tablets by mouth daily with supper.    . folic acid (FOLVITE) 1 MG tablet Take 1 mg by mouth 2 (two) times daily.    . furosemide (LASIX) 40 MG tablet Take 40 mg by mouth daily. Patient uses this medication Mon, Tues., and Wed.    Marland Kitchen lamoTRIgine (LAMICTAL) 100 MG tablet Take 1 tablet by mouth 2 (two) times daily.    Marland Kitchen levothyroxine (SYNTHROID, LEVOTHROID) 50 MCG tablet Take 50 mcg by mouth daily before breakfast.    . Magnesium 250 MG TABS Take 2 tablets by mouth 2 (two) times daily.    Marland Kitchen omeprazole (PRILOSEC) 40 MG capsule Take 1 capsule by mouth daily.    Marland Kitchen oxyCODONE-acetaminophen (PERCOCET) 7.5-325 MG per tablet Take 1 tablet by mouth. Every 4-6 hours as needed    . pramipexole (MIRAPEX) 0.25 MG tablet Take 0.5 mg by mouth at bedtime and may repeat dose one time if needed.     . pravastatin (PRAVACHOL) 80 MG tablet Take 1 tablet by mouth daily.    Marland Kitchen spironolactone (ALDACTONE) 25  MG tablet Take 25 mg by mouth Twice daily.    . Vitamin D, Ergocalciferol, (DRISDOL) 50000 UNITS CAPS Take 50,000 Units by mouth every 30 (thirty) days. Patient takes this on first Friday of the month     No current facility-administered medications for this visit.   Allergies as of 12/14/2014 - Review Complete 12/14/2014  Allergen Reaction Noted  . Oxycodone Itching and Hives 11/19/2012  . Hydromorphone Other (See Comments) 04/12/2014  . Morphine and related Other (See Comments) 04/12/2014    reports that she has never smoked. She has never used smokeless tobacco. She reports that she does not drink alcohol or use illicit drugs.   Review of Systems: She has lower extremity weakness and requires a walker for ambulation Pertinent positive and negative review of systems were noted in the above HPI section. All other review of systems were otherwise negative.  Vital signs were reviewed in today's medical record Physical Exam: General: Well developed , well nourished, no acute distress Skin: anicteric Head: Normocephalic and atraumatic Eyes:  sclerae anicteric, EOMI Ears: Normal auditory acuity Mouth: No deformity or lesions Neck: Supple, no masses or thyromegaly Lymph Nodes: no lymphadenopathy Lungs: Clear throughout to auscultation Heart: Regular rate and rhythm; no murmurs, rubs or bruits Gastroinestinal: Soft, non tender and non distended. No masses, hepatosplenomegaly or hernias noted. Normal Bowel sounds Rectal:deferred Musculoskeletal: Symmetrical with no gross deformities  Skin: No lesions on visible extremities Pulses:  Normal pulses noted Extremities: No clubbing, cyanosis, or deformities noted.  There is severe nonpitting ankle edema Neurological: Alert oriented x 4, grossly nonfocal Cervical Nodes:  No significant cervical adenopathy Inguinal Nodes: No significant inguinal adenopathy Psychological:  Alert and cooperative. Normal mood and affect  See Assessment and Plan  under Problem List

## 2014-12-14 NOTE — Assessment & Plan Note (Signed)
Plan colonoscopy every 5 years 

## 2014-12-14 NOTE — Assessment & Plan Note (Signed)
Plan colonoscopy to rule out a structural abnormality of the colon to account for change in bowel habits

## 2014-12-14 NOTE — Assessment & Plan Note (Signed)
The patient's reflux symptoms are fairly well-controlled with omeprazole.  She does complain of dysphagia raising the question of an esophageal stricture.    Recommendations #1 upper endoscopy with dilation as indicated

## 2014-12-14 NOTE — Assessment & Plan Note (Signed)
Probable multiple etiologies including possible chronic GI blood loss.  There is a note from her PCP of pernicious anemia.

## 2014-12-18 ENCOUNTER — Encounter: Payer: Self-pay | Admitting: Gastroenterology

## 2015-01-14 ENCOUNTER — Encounter: Payer: Self-pay | Admitting: Cardiology

## 2015-01-14 ENCOUNTER — Ambulatory Visit: Payer: Medicare Other | Admitting: Cardiology

## 2015-01-14 ENCOUNTER — Ambulatory Visit (INDEPENDENT_AMBULATORY_CARE_PROVIDER_SITE_OTHER): Payer: Medicare Other | Admitting: Cardiology

## 2015-01-14 VITALS — BP 142/72 | HR 59 | Ht 66.0 in | Wt 252.6 lb

## 2015-01-14 DIAGNOSIS — R0602 Shortness of breath: Secondary | ICD-10-CM | POA: Diagnosis not present

## 2015-01-14 DIAGNOSIS — F0390 Unspecified dementia without behavioral disturbance: Secondary | ICD-10-CM

## 2015-01-14 DIAGNOSIS — R0600 Dyspnea, unspecified: Secondary | ICD-10-CM

## 2015-01-14 DIAGNOSIS — IMO0001 Reserved for inherently not codable concepts without codable children: Secondary | ICD-10-CM | POA: Insufficient documentation

## 2015-01-14 DIAGNOSIS — Z0389 Encounter for observation for other suspected diseases and conditions ruled out: Secondary | ICD-10-CM

## 2015-01-14 DIAGNOSIS — E669 Obesity, unspecified: Secondary | ICD-10-CM | POA: Diagnosis not present

## 2015-01-14 DIAGNOSIS — M549 Dorsalgia, unspecified: Secondary | ICD-10-CM | POA: Insufficient documentation

## 2015-01-14 DIAGNOSIS — R079 Chest pain, unspecified: Secondary | ICD-10-CM | POA: Insufficient documentation

## 2015-01-14 DIAGNOSIS — I10 Essential (primary) hypertension: Secondary | ICD-10-CM

## 2015-01-14 DIAGNOSIS — R0789 Other chest pain: Secondary | ICD-10-CM

## 2015-01-14 DIAGNOSIS — N289 Disorder of kidney and ureter, unspecified: Secondary | ICD-10-CM | POA: Insufficient documentation

## 2015-01-14 DIAGNOSIS — G2581 Restless legs syndrome: Secondary | ICD-10-CM

## 2015-01-14 DIAGNOSIS — I890 Lymphedema, not elsewhere classified: Secondary | ICD-10-CM

## 2015-01-14 NOTE — Assessment & Plan Note (Deleted)
Chronic edema but worse last two weeks

## 2015-01-14 NOTE — Assessment & Plan Note (Signed)
Pt c/o chest pressure with exertion

## 2015-01-14 NOTE — Patient Instructions (Signed)
Your physician has requested that you have an echocardiogram. Echocardiography is a painless test that uses sound waves to create images of your heart. It provides your doctor with information about the size and shape of your heart and how well your heart's chambers and valves are working. This procedure takes approximately one hour. There are no restrictions for this procedure.  Your physician has requested that you have a lexiscan myoview. For further information please visit [email or web address]. Please follow instruction sheet, as given.  Kerin Ransom, PA-C, recommends that you schedule a follow-up appointment with Dr Angelena Form, first available.  **Please ask Dr Leonarda Salon to draw the following labs and fax a copy to [place]. Our fax number is [phone].**  CBC  BMP  BNP  TSH

## 2015-01-14 NOTE — Assessment & Plan Note (Signed)
LVF normal by echo Sept 2013

## 2015-01-14 NOTE — Assessment & Plan Note (Signed)
She says she had a sleep study in the past, she is not on C-Pap

## 2015-01-14 NOTE — Assessment & Plan Note (Signed)
Chronic LE edema but worse last two weeks

## 2015-01-14 NOTE — Assessment & Plan Note (Signed)
Pt referred by PCP for evaluation of two weeks of increasing dyspnea, orthopnea, and edema

## 2015-01-14 NOTE — Progress Notes (Signed)
01/14/2015 [name]   03/23/1946  [id number]  Primary Physician Sherrie Mustache, MD Primary Cardiologist: Dr Julianne Handler (pt request)  HPI:  69 y/o female refered today for evaluation of dyspnea and edema. The pt has a history of multiple medical issues. She is here with her daughter.  She had normal coronaries in 2009 and normal LVF by echo in Sept 2013. She is obese, has chronic lymphedema, chronic back pain, HTN, fibromyalgia, and restless leg. She has recently been placed on Aricept for mild memory issues.            For the past two weeks the pt has noted increasing LE edema, increasing wgt (235-252), orthopnea (sleeping in recliner), and exertional SOB and chest discomfort. Dr Edrick Oh has been increasing her diuretics with some improvement. The pt's recent SCr is 1.6 according to the daughter.    Current Outpatient Prescriptions  Medication Sig Dispense Refill  . alendronate (FOSAMAX) 70 MG tablet Take 70 mg by mouth once a week. Take with a full glass of water on an empty stomach.    . ALPRAZolam (XANAX) 0.5 MG tablet Take 0.5 mg by mouth 2 (two) times daily.     Marland Kitchen amLODipine (NORVASC) 5 MG tablet Take by mouth Daily.    Marland Kitchen aspirin 81 MG tablet Take 81 mg by mouth at bedtime.    . baclofen (LIORESAL) 10 MG tablet Take 10 mg by mouth 3 (three) times daily.    . calcium carbonate (OS-CAL) 600 MG TABS Take 600 mg by mouth 2 (two) times daily with a meal.    . donepezil (ARICEPT) 10 MG tablet Take 1 tablet by mouth daily.    Marland Kitchen escitalopram (LEXAPRO) 20 MG tablet Take 1.5 tablets by mouth daily with supper.    . folic acid (FOLVITE) 1 MG tablet Take 1 mg by mouth 2 (two) times daily.    . furosemide (LASIX) 40 MG tablet Take 40 mg by mouth daily. Patient uses this medication Mon, Tues., and Wed.    Marland Kitchen lamoTRIgine (LAMICTAL) 100 MG tablet Take 1 tablet by mouth 2 (two) times daily.    Marland Kitchen levothyroxine (SYNTHROID, LEVOTHROID) 50 MCG tablet Take 50 mcg by mouth daily before  breakfast.    . Magnesium 250 MG TABS Take 2 tablets by mouth 2 (two) times daily.    . metoCLOPramide (REGLAN) 10 MG tablet Take 1 tablet (10 mg total) by mouth once. 2 tablet 0  . Na Sulfate-K Sulfate-Mg Sulf (SUPREP BOWEL PREP) SOLN Take 1 kit by mouth once. 1 Bottle 0  . omeprazole (PRILOSEC) 40 MG capsule Take 1 capsule by mouth daily.    Marland Kitchen oxyCODONE-acetaminophen (PERCOCET) 7.5-325 MG per tablet Take 1 tablet by mouth. Every 4-6 hours as needed    . pramipexole (MIRAPEX) 0.25 MG tablet Take 0.5 mg by mouth at bedtime and may repeat dose one time if needed.     . pravastatin (PRAVACHOL) 80 MG tablet Take 1 tablet by mouth daily.    Marland Kitchen spironolactone (ALDACTONE) 25 MG tablet Take 25 mg by mouth Twice daily.    . Vitamin D, Ergocalciferol, (DRISDOL) 50000 UNITS CAPS Take 50,000 Units by mouth every 30 (thirty) days. Patient takes this on first Friday of the month    . metolazone (ZAROXOLYN) 5 MG tablet Take 5 mg by mouth 3 (three) times a week.     No current facility-administered medications for this visit.    Allergies  Allergen Reactions  . Oxycodone Itching and  Hives  . Hydromorphone Other (See Comments)    hallucinations  . Morphine And Related Other (See Comments)    hallucinations    History   Social History  . Marital Status: Widowed    Spouse Name: N/A  . Number of Children: 3  . Years of Education: N/A   Occupational History  . retired    Social History Main Topics  . Smoking status: Never Smoker   . Smokeless tobacco: Never Used  . Alcohol Use: No  . Drug Use: No  . Sexual Activity: Not Currently   Other Topics Concern  . Not on file   Social History Narrative   Pt does use caffeine. Lives alone. 3 children. Retired.     Review of Systems: General: negative for chills, fever, night sweats or weight changes.  Cardiovascular: palpitations Dermatological: negative for rash Respiratory: negative for cough or wheezing Urologic: negative for  hematuria Abdominal: negative for nausea, vomiting, diarrhea, bright red blood per rectum, melena, or hematemesis Neurologic: negative for visual changes, syncope, or dizziness All other systems reviewed and are otherwise negative except as noted above.    Blood pressure 142/72, pulse 59, height '5\' 6"'  (1.676 m), weight 252 lb 9.6 oz (114.579 kg).  General appearance: alert, cooperative, mild distress and morbidly obese Neck: no carotid bruit and no JVD Lungs: clear to auscultation bilaterally Heart: regular rate and rhythm Abdomen: obese Extremities: 3+ lymphedema Pulses: 2+ and symmetric Skin: Skin color, texture, turgor normal. No rashes or lesions Neurologic: Grossly normal  EKG NSR  ASSESSMENT AND PLAN:   Dyspnea Pt referred by PCP for evaluation of two weeks of increasing dyspnea, orthopnea, and edema   Lymphedema Chronic LE edema but worse last two weeks   Chest pain Pt c/o chest pressure with exertion   Normal coronary arteries 2009 LVF normal by echo Sept 2013   Obesity-BMI 40 She says she had a sleep study in the past, she is not on C-Pap    PLAN  I think diuretics are being appropriately adjusted by Dr Edrick Oh. She is going to his office today and I have asked the pt to ask for a BNP, BMP, CBC, TSH. I scheduled her for an echo and a [employer]. She has requested to see Dr Julianne Handler in f/u and this will be arranged.   Kerin Ransom K PA-C 01/14/2015 8:41 AM

## 2015-02-01 ENCOUNTER — Telehealth: Payer: Self-pay | Admitting: Cardiovascular Disease

## 2015-02-01 ENCOUNTER — Telehealth (HOSPITAL_COMMUNITY): Payer: Self-pay

## 2015-02-01 NOTE — Telephone Encounter (Signed)
Encounter complete. 

## 2015-02-04 ENCOUNTER — Telehealth: Payer: Self-pay | Admitting: *Deleted

## 2015-02-04 NOTE — Telephone Encounter (Signed)
Per Dr Deatra Ina contacted patient to see how she feels after seen by cardiology  Patient stated she feels fine and she was not diagnosed with CHF. She wants to continue with colon/endo tomorrow

## 2015-02-04 NOTE — Telephone Encounter (Signed)
Closed encounter °

## 2015-02-05 ENCOUNTER — Encounter: Payer: Self-pay | Admitting: Gastroenterology

## 2015-02-05 ENCOUNTER — Ambulatory Visit (AMBULATORY_SURGERY_CENTER): Payer: Medicare Other | Admitting: Gastroenterology

## 2015-02-05 VITALS — BP 136/71 | HR 66 | Temp 98.1°F | Resp 24 | Ht 66.0 in | Wt 246.0 lb

## 2015-02-05 DIAGNOSIS — R1310 Dysphagia, unspecified: Secondary | ICD-10-CM

## 2015-02-05 DIAGNOSIS — R195 Other fecal abnormalities: Secondary | ICD-10-CM | POA: Diagnosis present

## 2015-02-05 DIAGNOSIS — K222 Esophageal obstruction: Secondary | ICD-10-CM | POA: Diagnosis not present

## 2015-02-05 DIAGNOSIS — K5730 Diverticulosis of large intestine without perforation or abscess without bleeding: Secondary | ICD-10-CM | POA: Diagnosis not present

## 2015-02-05 DIAGNOSIS — Z800 Family history of malignant neoplasm of digestive organs: Secondary | ICD-10-CM

## 2015-02-05 NOTE — Progress Notes (Signed)
Called to room to assist during endoscopic procedure.  Patient ID and intended procedure confirmed with present staff. Received instructions for my participation in the procedure from the performing physician.  

## 2015-02-05 NOTE — Progress Notes (Signed)
1400- J Monday CRNA made aware of pt's lymphedema to lower extremities

## 2015-02-05 NOTE — Patient Instructions (Signed)
Diverticulosis seen today, handout given. Repeat colonoscopy in 5 years. Use stool hemoccults in 7-10 days, cards given. Dilation diet today, see handout.   YOU HAD AN ENDOSCOPIC PROCEDURE TODAY AT [facility]:   Refer to the procedure report that was given to you for any specific questions about what was found during the examination.  If the procedure report does not answer your questions, please call your gastroenterologist to clarify.  If you requested that your care partner not be given the details of your procedure findings, then the procedure report has been included in a sealed envelope for you to review at your convenience later.  YOU SHOULD EXPECT: Some feelings of bloating in the abdomen. Passage of more gas than usual.  Walking can help get rid of the air that was put into your GI tract during the procedure and reduce the bloating. If you had a lower endoscopy (such as a colonoscopy or flexible sigmoidoscopy) you may notice spotting of blood in your stool or on the toilet paper. If you underwent a bowel prep for your procedure, you may not have a normal bowel movement for a few days.  Please Note:  You might notice some irritation and congestion in your nose or some drainage.  This is from the oxygen used during your procedure.  There is no need for concern and it should clear up in a day or so.  SYMPTOMS TO REPORT IMMEDIATELY:   Following lower endoscopy (colonoscopy or flexible sigmoidoscopy):  Excessive amounts of blood in the stool  Significant tenderness or worsening of abdominal pains  Swelling of the abdomen that is new, acute  Fever of 100F or higher   Following upper endoscopy (EGD)  Vomiting of blood or coffee ground material  New chest pain or pain under the shoulder blades  Painful or persistently difficult swallowing  New shortness of breath  Fever of 100F or higher  Black, tarry-looking stools  For urgent or emergent issues, a gastroenterologist  can be reached at any hour by calling [phone].   DIET: dilation diet: nothing by mouth for 1 hour, then clear liquids for 1 hour, then soft foods rest of today. May resume regular diet tomorrow. Drink plenty of fluids but you should avoid alcoholic beverages for 24 hours.  ACTIVITY:  You should plan to take it easy for the rest of today and you should NOT DRIVE or use heavy machinery until tomorrow (because of the sedation medicines used during the test).    FOLLOW UP: Our staff will call the number listed on your records the next business day following your procedure to check on you and address any questions or concerns that you may have regarding the information given to you following your procedure. If we do not reach you, we will leave a message.  However, if you are feeling well and you are not experiencing any problems, there is no need to return our call.  We will assume that you have returned to your regular daily activities without incident.  If any biopsies were taken you will be contacted by phone or by letter within the next 1-3 weeks.  Please call us at [phone] if you have not heard about the biopsies in 3 weeks.    SIGNATURES/CONFIDENTIALITY: You and/or your care partner have signed paperwork which will be entered into your electronic medical record.  These signatures attest to the fact that that the information above on your After Visit Summary has been reviewed  and is understood.  Full responsibility of the confidentiality of this discharge information lies with you and/or your care-partner. 

## 2015-02-05 NOTE — Op Note (Signed)
[place]  [address]. [place], [address]   ENDOSCOPY PROCEDURE REPORT  PATIENT: [name], [name]  MR#: [id number] BIRTHDATE: Aug 27, 1945 , 53  yrs. old GENDER: female ENDOSCOPIST: Inda Castle, MD REFERRED BY:  Dione Housekeeper, M.D. PROCEDURE DATE:  02/05/2015 PROCEDURE:  EGD, diagnostic and Maloney dilation of esophagus ASA CLASS:     Class II INDICATIONS:  dysphagia and occult blood positive. MEDICATIONS: Residual sedation present, Monitored anesthesia care, and Propofol 100 mg IV TOPICAL ANESTHETIC:  DESCRIPTION OF PROCEDURE: After the risks benefits and alternatives of the procedure were thoroughly explained, informed consent was obtained.  The LB [id number] endoscope was introduced through the mouth and advanced to the second portion of the duodenum , Without limitations.  The instrument was slowly withdrawn as the mucosa was fully examined.    ESOPHAGUS: There was a peptic stricture at the gastroesophageal junction.  The stricture was traversable.  The stricture was dilated using a 27mm (54Fr) Maloney dilator. There was mild resistance and no heme.  Except for the findings listed, the EGD was otherwise normal.  Retroflexed views revealed no abnormalities. The scope was then withdrawn from the patient and the procedure completed.  COMPLICATIONS: There were no immediate complications.  ENDOSCOPIC IMPRESSION: 1.   There was a stricture at the gastroesophageal junction; The stricture was dilated using a 31mm (54Fr) Maloney dilator 2.   EGD was otherwise normal  RECOMMENDATIONS: Hemmoccult stools   in 7-10 days  REPEAT EXAM:  eSigned:  Inda Castle, MD 02/05/2015 2:50 PM    CC:

## 2015-02-05 NOTE — Op Note (Addendum)
[place]  [address]. [place], [address]   COLONOSCOPY PROCEDURE REPORT  PATIENT: [name], [name]  MR#: [id number] BIRTHDATE: [birth year] , 69  yrs. old GENDER: female ENDOSCOPIST: Inda Castle, MD REFERRED HO:[id number] Edrick Oh, M.D. PROCEDURE DATE:  02/05/2015 PROCEDURE:   Colonoscopy, diagnostic First Screening Colonoscopy - Avg.  risk and is 50 yrs.  old or older - No.  Prior Negative Screening - Now for repeat screening. 10 or more years since last screening  History of Adenoma - Now for follow-up colonoscopy & has been > or = to 3 yrs.  N/A  Recommend repeat exam, <10 yrs? Yes ASA CLASS:   Class II INDICATIONS:FH Colon or Rectal Adenocarcinoma. MEDICATIONS: Monitored anesthesia care and Propofol 200 mg IV  DESCRIPTION OF PROCEDURE:   After the risks benefits and alternatives of the procedure were thoroughly explained, informed consent was obtained.  The digital rectal exam revealed no abnormalities of the rectum.   The LB [id number]  endoscope was introduced through the anus and advanced to the ileum. No adverse events experienced.   The quality of the prep was (Suprep was used) good.  The instrument was then slowly withdrawn as the colon was fully examined. Estimated blood loss is zero unless otherwise noted in this procedure report.      COLON FINDINGS: There was mild diverticulosis noted in the descending colon.   The examination was otherwise normal. Retroflexed views revealed no abnormalities. The time to cecum = 0.4 Withdrawal time = 6.4   The scope was withdrawn and the procedure completed. COMPLICATIONS: There were no immediate complications.  ENDOSCOPIC IMPRESSION: 1.   Mild diverticulosis was noted in the descending colon 2.   The examination was otherwise normal  RECOMMENDATIONS: 1.  Given your significant family history of colon cancer, you should have a repeat colonoscopy in 5 years 2.  Upper endoscopy will be  scheduled  eSigned:  Inda Castle, MD 02/05/2015 2:47 PM   cc:  DOCUMENT ADDENDUM eSigned:  Inda Castle, MD 02/05/2015 2:56 PM  Reason for addendum: [ ]  Correction of inaccurate information [ ]  Recently acquired lab/pathology results [x]  Additional information  Comments: Patient has been taking Amitiza 24 g daily along with MiraLAX and still complains of constipation.  She was instructed to increase Amitiza to twice a day and take MiraLAX as needed.  She will call back in one week.    PATIENT NAME:  [name], [name] MR#: [id number]

## 2015-02-05 NOTE — Telephone Encounter (Signed)
DR Traci Sermon

## 2015-02-05 NOTE — Progress Notes (Signed)
Report to PACU, RN, vss, BBS= Clear.  

## 2015-02-06 ENCOUNTER — Inpatient Hospital Stay (HOSPITAL_COMMUNITY): Admission: RE | Admit: 2015-02-06 | Payer: Medicare Other | Source: Ambulatory Visit

## 2015-02-06 ENCOUNTER — Telehealth: Payer: Self-pay | Admitting: *Deleted

## 2015-02-06 NOTE — Telephone Encounter (Signed)
  Follow up Call-  Call back number 02/05/2015  Post procedure Call Back phone  # [phone] or [phone] (daughter- Vicente Males)  Permission to leave phone message No  comments not able to leave message     Patient questions:  Do you have a fever, pain , or abdominal swelling? No. Pain Score  0 *  Have you tolerated food without any problems? Yes.    Have you been able to return to your normal activities? Yes.    Do you have any questions about your discharge instructions: Diet   No. Medications  No. Follow up visit  No.  Do you have questions or concerns about your Care? No.  Actions: * If pain score is 4 or above: No action needed, pain <4.

## 2015-02-07 ENCOUNTER — Inpatient Hospital Stay (HOSPITAL_COMMUNITY): Admission: RE | Admit: 2015-02-07 | Payer: Medicare Other | Source: Ambulatory Visit

## 2015-02-11 ENCOUNTER — Ambulatory Visit: Payer: Medicare Other | Admitting: Physician Assistant

## 2015-02-14 ENCOUNTER — Ambulatory Visit (HOSPITAL_COMMUNITY): Payer: Medicare Other | Attending: Family Medicine | Admitting: Physical Therapy

## 2015-02-14 DIAGNOSIS — I890 Lymphedema, not elsewhere classified: Secondary | ICD-10-CM | POA: Diagnosis present

## 2015-02-14 DIAGNOSIS — R262 Difficulty in walking, not elsewhere classified: Secondary | ICD-10-CM | POA: Diagnosis not present

## 2015-02-14 NOTE — Therapy (Signed)
[place], [place], [address] Phone: [phone]   Fax:  [phone]  Physical Therapy Evaluation  Patient Details  Name: [name] MRN: [id number] Date of Birth: [birth year] Referring Provider:  Dione Housekeeper, MD  Encounter Date: 02/14/2015      PT End of Session - 02/14/15 1627    Visit Number 1   Number of Visits 24   Date for PT Re-Evaluation 04/15/15   Authorization Type medicare   Authorization - Visit Number 1   Authorization - Number of Visits 10   PT Start Time 0923   PT Stop Time 1555   PT Time Calculation (min) 80 min   Activity Tolerance Patient tolerated treatment well   Behavior During Therapy [facility] for tasks assessed/performed      Past Medical History  Diagnosis Date  . Rotator cuff injury   . Fatigue   . Edema   . Hyperlipidemia   . Depression   . Anxiety   . Syncope   . Chest pain     Normal cardiac cath 5/09  . Dumping syndrome   . Lumbar spondylosis   . Cervical spondylosis with myelopathy   . Hypertension   . Fibromyalgia   . PONV (postoperative nausea and vomiting)   . Restless leg syndrome   . Hypothyroidism   . Anemia   . Tremors of nervous system   . Arthritis   . Brain lesion     2 types  . Stroke     TIAs "mini strokes"- unsure of last TIA  . Lymphedema     seeing specialist for this    Past Surgical History  Procedure Laterality Date  . Rotator cuff repair Bilateral   . Total knee arthroplasty Left   . Appendectomy  1966  . Lumbar disc surgery      x2  . Total abdominal hysterectomy    . Total knee arthroplasty Right 11/18/2012    Procedure: TOTAL KNEE ARTHROPLASTY;  Surgeon: Yvette Rack., MD;  Location: [place];  Service: Orthopedics;  Laterality: Right;  . Knee arthroscopy Bilateral   . Cholecystectomy  2015  . Hip bursa surgery Bilateral   . Lumbar fusion    . Coronary angiogram  2009    Normal coronaries  . Cholecystectomy  2015    There  were no vitals filed for this visit.  Visit Diagnosis:  Lymphedema  Difficulty walking      Subjective Assessment - 02/14/15 1612    Subjective [name] is a 69 yo female who states despite being on two diuretics and upping the dosage she continues to have significant swelling in B LE.  She states she has been sent to numorous MD's without improvement.  Her daughter accompanies her today and states that about two years ago they went to a lymphedema therapist and that was the only thing that got her legs down.  She had gotten down and was wearing compression garments but approximately 3 months ago her legs became so swollen that she could not get the garments on therefore she has been without compression. She is hoping that therapy can help once again.    Patient is accompained by: Family member   Pertinent History +lymphedema; back surgery; B TKR; shoulder replacement.   How long can you sit comfortably? no problem   How long can you stand comfortably? 5-10 miinutes    How long can you walk comfortably? 5-10 miinutes with rolling  walker    Patient Stated Goals less fluid , be able to lift her leg up, easier to walk    Currently in Pain? Yes   Pain Location Leg   Pain Orientation Right;Left   Pain Descriptors / Indicators Tightness;Throbbing;Heaviness       Date   02/14/2015 02/14/2015     Rt Lt  MTP   23 23  ankle   34.20 38.30  4cm   42.50 37.30  8cm   45.50 43.10  12 cm   47.80 46.60  16cm   50.20 48.70  20cm   55.80 50.80  24cm   57.00 52.00  28cm   56.00 56.60  32cm   53.20 58.30  36cm   60.20 56.50  40cm   63.20 56.80  44cm   63.00 63.20  48cm   64.30 64.00                                Sum of squares   38430.67 16384.53  Total Volume   12232.867 64680.321          [facility] PT Assessment - 02/14/15 0001    Assessment   Medical Diagnosis lymphedema   Onset Date/Surgical Date --  for years   Hand Dominance Right   Next MD Visit --  unknown   Prior Therapy --   a year or more ago   Precautions   Precautions Fall   Restrictions   Weight Bearing Restrictions No   Balance Screen   Has the patient fallen in the past 6 months No   Has the patient had a decrease in activity level because of a fear of falling?  Yes   Is the patient reluctant to leave their home because of a fear of falling?  No   Home Ecologist residence   Prior Function   Level of Independence Independent with basic ADLs   Vocation Retired   Leisure pool   Cognition   Overall Cognitive Status Within Functional Limits for tasks assessed   Observation/Other Assessments   Focus on Therapeutic Outcomes (FOTO)  volume measurements    Observation/Other Assessments-Edema    Edema Circumferential   Posture/Postural Control   Posture Comments wt:  256#                   [facility] Adult PT Treatment/Exercise - 02/14/15 0001    Manual Therapy   Manual Therapy Manual Lymphatic Drainage (MLD)   Manual therapy comments B LE including supraclavicular, deep and superficial abdominal and routing fluid using inguinal/axillary anastomosis.  Anterior only completed today due to time restraints.  Foam was cut for Lt LE more foam was ordered to be able to cut for Rt and Upper leg                 PT Education - 02/14/15 1620    Education provided Yes   Education Details LE exercises as well as handout on lymphedma   Person(s) Educated Patient   Methods Explanation;Handout   Comprehension Verbalized understanding          PT Short Term Goals - 02/14/15 1633    PT SHORT TERM GOAL #1   Title Pt to be I in skin care and care of bandages   Time 2   Period Weeks   PT SHORT TERM GOAL #2   Title Pt to verbalize precautions for decreasing  risk of infection   Time 2   Period Weeks   PT SHORT TERM GOAL #3   Title reduce volume by 30%   Time 4   Period Weeks           PT Long Term Goals - Mar 05, 2015 1636    PT LONG TERM GOAL #1   Title  Reduce volume by 50%   Time 8   Period Weeks   PT LONG TERM GOAL #2   Title Pt to verbalize understanding of the maintenance phase of tx   Time 8   Period Weeks   PT LONG TERM GOAL #3   Title Pt to understand where and how often to be fitted for a compression garment   Time 8   Period Weeks   PT LONG TERM GOAL #4   Title Pt to be able to lift LE onto the treatment table to allow ease of getting in and our of bed   Time 8   Period Weeks   PT LONG TERM GOAL #5   Title Pt to state she is able to walk for 10 minutes without difficulty ( pt limited to 10 minutes due to back issues)   Time 8   Period Weeks               Plan - 2015-03-05 1628    Clinical Impression Statement Ms. Fort is a 69 yo female with acute exacerbation of her lymphedema who has been referred for complete lymphedema techniques.Examination demonstrates labored ambulation, inability to lift LE onto mat and B lymphedema.  Ms. Perrier will benefit from skilled physcial therapy by certified lymphedema therapist for manual and compression to decrease her volume of fluiid to reduce risk of cellulitis.     Pt will benefit from skilled therapeutic intervention in order to improve on the following deficits Pain;Obesity;Difficulty walking;Increased edema   Rehab Potential Good   PT Frequency 3x / week   PT Duration 8 weeks   PT Treatment/Interventions Compression bandaging;Manual lymph drainage   PT Next Visit Plan cut foam when available; begin anterior as well as posterior manual lymph drainage  and begin compression wrapping using short stretch bandages as soon as able.    PT Home Exercise Plan given   Consulted and Agree with Plan of Care Patient          G-Codes - Mar 05, 2015 1637    Functional Assessment Tool Used volume   Functional Limitation Other PT primary   Other PT Primary Current Status (U2025) At least 40 percent but less than 60 percent impaired, limited or restricted   Other PT Primary Goal Status  (K2706) At least 20 percent but less than 40 percent impaired, limited or restricted       Problem List Patient Active Problem List   Diagnosis Date Noted  . Normal coronary arteries 2009 01/14/2015  . Obesity-BMI 40 01/14/2015  . Restless leg 01/14/2015  . Chest pain 01/14/2015  . Back pain 01/14/2015  . Renal insufficiency 01/14/2015  . Dementia- mild memory issues 01/14/2015  . Occult blood positive stool 12/14/2014  . Anemia 12/14/2014  . Family history of colon cancer 12/14/2014  . Change in bowel habits 12/14/2014  . GERD (gastroesophageal reflux disease) 12/14/2014  . Dyspnea 05/17/2012  . Lymphedema 05/17/2012  . Weight gain 05/17/2012  . HTN (hypertension) 05/17/2012  Rayetta Humphrey, PT CLT [phone] 03/05/2015, 4:38 PM  [place], [place], [address] Phone:  [phone]   Fax:  [phone]

## 2015-02-18 ENCOUNTER — Ambulatory Visit (HOSPITAL_COMMUNITY): Payer: Medicare Other | Admitting: Physical Therapy

## 2015-02-18 DIAGNOSIS — I890 Lymphedema, not elsewhere classified: Secondary | ICD-10-CM

## 2015-02-18 DIAGNOSIS — R262 Difficulty in walking, not elsewhere classified: Secondary | ICD-10-CM

## 2015-02-18 NOTE — Therapy (Signed)
[name], [place], [address] Phone: [phone]   Fax:  [phone]  Physical Therapy Treatment  Patient Details  Name: [name] MRN: [id number] Date of Birth: [birth year] Referring Provider:  Dione Housekeeper, MD  Encounter Date: 02/18/2015      PT End of Session - 02/18/15 1717    Visit Number 2   Number of Visits 24   Authorization Type medicare   Authorization - Visit Number 2   Authorization - Number of Visits 10   PT Start Time 6812   PT Stop Time 7517   PT Time Calculation (min) 44 min   Activity Tolerance Patient tolerated treatment well      Past Medical History  Diagnosis Date  . Rotator cuff injury   . Fatigue   . Edema   . Hyperlipidemia   . Depression   . Anxiety   . Syncope   . Chest pain     Normal cardiac cath 5/09  . Dumping syndrome   . Lumbar spondylosis   . Cervical spondylosis with myelopathy   . Hypertension   . Fibromyalgia   . PONV (postoperative nausea and vomiting)   . Restless leg syndrome   . Hypothyroidism   . Anemia   . Tremors of nervous system   . Arthritis   . Brain lesion     2 types  . Stroke     TIAs "mini strokes"- unsure of last TIA  . Lymphedema     seeing specialist for this    Past Surgical History  Procedure Laterality Date  . Rotator cuff repair Bilateral   . Total knee arthroplasty Left   . Appendectomy  1966  . Lumbar disc surgery      x2  . Total abdominal hysterectomy    . Total knee arthroplasty Right 11/18/2012    Procedure: TOTAL KNEE ARTHROPLASTY;  Surgeon: Yvette Rack., MD;  Location: [place];  Service: Orthopedics;  Laterality: Right;  . Knee arthroscopy Bilateral   . Cholecystectomy  2015  . Hip bursa surgery Bilateral   . Lumbar fusion    . Coronary angiogram  2009    Normal coronaries  . Cholecystectomy  2015    There were no vitals filed for this visit.  Visit Diagnosis:  Lymphedema  Difficulty walking       Subjective Assessment - 02/18/15 1714    Subjective Pt states she was up the whole night after last manual having to urinate.  She states even her son was amazed he could see her ankles but swelling began to return on Monday.  Pt is to recieve her short stretch bandages on Wed.    Currently in Pain? No/denies                         [facility] Adult PT Treatment/Exercise - 02/18/15 0001    Posture/Postural Control   Posture Comments wt:  257#   Manual Therapy   Manual Therapy Manual Lymphatic Drainage (MLD)   Manual therapy comments B LE including supraclavicular, deep and superficial abdominal and routing fluid using inguinal/axillary anastomosis.  Anteriorly ; posterior done on side.                   PT Short Term Goals - 02/14/15 1633    PT SHORT TERM GOAL #1   Title Pt to be I in skin care and care of bandages  Time 2   Period Weeks   PT SHORT TERM GOAL #2   Title Pt to verbalize precautions for decreasing risk of infection   Time 2   Period Weeks   PT SHORT TERM GOAL #3   Title reduce volume by 30%   Time 4   Period Weeks           PT Long Term Goals - 02/14/15 1636    PT LONG TERM GOAL #1   Title Reduce volume by 50%   Time 8   Period Weeks   PT LONG TERM GOAL #2   Title Pt to verbalize understanding of the maintenance phase of tx   Time 8   Period Weeks   PT LONG TERM GOAL #3   Title Pt to understand where and how often to be fitted for a compression garment   Time 8   Period Weeks   PT LONG TERM GOAL #4   Title Pt to be able to lift LE onto the treatment table to allow ease of getting in and our of bed   Time 8   Period Weeks   PT LONG TERM GOAL #5   Title Pt to state she is able to walk for 10 minutes without difficulty ( pt limited to 10 minutes due to back issues)   Time 8   Period Weeks               Plan - 02/18/15 1717    Clinical Impression Statement [name] responded favorably to manual techniques to reduce  lymph fluid.  Pt will need compression wrapping to maintain gains.    Pt will benefit from skilled therapeutic intervention in order to improve on the following deficits Pain;Obesity;Difficulty walking;Increased edema   PT Next Visit Plan Initially wrap LE only  after two treatments with LE wrapped we may proceed in wrapping the entire LE B.  Remeasure at the end of this week.         Problem List Patient Active Problem List   Diagnosis Date Noted  . Normal coronary arteries 2009 01/14/2015  . Obesity-BMI 40 01/14/2015  . Restless leg 01/14/2015  . Chest pain 01/14/2015  . Back pain 01/14/2015  . Renal insufficiency 01/14/2015  . Dementia- mild memory issues 01/14/2015  . Occult blood positive stool 12/14/2014  . Anemia 12/14/2014  . Family history of colon cancer 12/14/2014  . Change in bowel habits 12/14/2014  . GERD (gastroesophageal reflux disease) 12/14/2014  . Dyspnea 05/17/2012  . Lymphedema 05/17/2012  . Weight gain 05/17/2012  . HTN (hypertension) 05/17/2012   Rayetta Humphrey, PT CLT [phone] 02/18/2015, 5:19 PM  [place] [address] [place], [place], [address] Phone: [phone]   Fax:  [phone]

## 2015-02-19 ENCOUNTER — Telehealth (HOSPITAL_COMMUNITY): Payer: Self-pay

## 2015-02-19 NOTE — Telephone Encounter (Signed)
Encounter complete. 

## 2015-02-20 ENCOUNTER — Ambulatory Visit (HOSPITAL_COMMUNITY): Payer: Medicare Other | Admitting: Physical Therapy

## 2015-02-20 ENCOUNTER — Telehealth (HOSPITAL_COMMUNITY): Payer: Self-pay

## 2015-02-20 ENCOUNTER — Telehealth: Payer: Self-pay | Admitting: Gastroenterology

## 2015-02-20 DIAGNOSIS — I890 Lymphedema, not elsewhere classified: Secondary | ICD-10-CM | POA: Diagnosis not present

## 2015-02-20 DIAGNOSIS — R262 Difficulty in walking, not elsewhere classified: Secondary | ICD-10-CM

## 2015-02-20 NOTE — Telephone Encounter (Signed)
She can try Linzess 127mcg qd

## 2015-02-20 NOTE — Telephone Encounter (Signed)
No answer. No voicemail. 

## 2015-02-20 NOTE — Therapy (Signed)
[place], [place], [address] Phone: [phone]   Fax:  [phone]  Physical Therapy Treatment  Patient Details  Name: [name] MRN: [id number] Date of Birth: [birth year] Referring Provider:  Dione Housekeeper, MD  Encounter Date: 02/20/2015      PT End of Session - 02/20/15 1257    Visit Number 3   Number of Visits 24   Authorization Type medicare   Authorization - Visit Number 3   Authorization - Number of Visits 10   PT Start Time 8527   PT Stop Time 7824   PT Time Calculation (min) 90 min   Activity Tolerance Patient tolerated treatment well   Behavior During Therapy [facility] for tasks assessed/performed      Past Medical History  Diagnosis Date  . Rotator cuff injury   . Fatigue   . Edema   . Hyperlipidemia   . Depression   . Anxiety   . Syncope   . Chest pain     Normal cardiac cath 5/09  . Dumping syndrome   . Lumbar spondylosis   . Cervical spondylosis with myelopathy   . Hypertension   . Fibromyalgia   . PONV (postoperative nausea and vomiting)   . Restless leg syndrome   . Hypothyroidism   . Anemia   . Tremors of nervous system   . Arthritis   . Brain lesion     2 types  . Stroke     TIAs "mini strokes"- unsure of last TIA  . Lymphedema     seeing specialist for this    Past Surgical History  Procedure Laterality Date  . Rotator cuff repair Bilateral   . Total knee arthroplasty Left   . Appendectomy  1966  . Lumbar disc surgery      x2  . Total abdominal hysterectomy    . Total knee arthroplasty Right 11/18/2012    Procedure: TOTAL KNEE ARTHROPLASTY;  Surgeon: Yvette Rack., MD;  Location: [facility];  Service: Orthopedics;  Laterality: Right;  . Knee arthroscopy Bilateral   . Cholecystectomy  2015  . Hip bursa surgery Bilateral   . Lumbar fusion    . Coronary angiogram  2009    Normal coronaries  . Cholecystectomy  2015    There were no vitals filed for this  visit.  Visit Diagnosis:  Lymphedema  Difficulty walking      Subjective Assessment - 02/20/15 1249    Subjective Pt states she has been voiding so much that her daughter is going to get her some depends.  States she can really tell the fluid is down in her LE's.  Pt also reports MD contacted her with reduced creatinine levels from 2.25 to 1.5.                           [facility] Adult PT Treatment/Exercise - 02/20/15 1245    Posture/Postural Control   Posture Comments wt:  253#   Manual Therapy   Manual Therapy Manual Lymphatic Drainage (MLD)   Manual therapy comments bandaging to bilateral LE's knee level using multilayered short stretch bandaging, 1/2" foam following MLD   Manual Lymphatic Drainage (MLD) bilateral LE via inguinal axillary anastmosis                  PT Short Term Goals - 02/14/15 1633    PT SHORT TERM GOAL #1   Title Pt to  be I in skin care and care of bandages   Time 2   Period Weeks   PT SHORT TERM GOAL #2   Title Pt to verbalize precautions for decreasing risk of infection   Time 2   Period Weeks   PT SHORT TERM GOAL #3   Title reduce volume by 30%   Time 4   Period Weeks           PT Long Term Goals - 02/14/15 1636    PT LONG TERM GOAL #1   Title Reduce volume by 50%   Time 8   Period Weeks   PT LONG TERM GOAL #2   Title Pt to verbalize understanding of the maintenance phase of tx   Time 8   Period Weeks   PT LONG TERM GOAL #3   Title Pt to understand where and how often to be fitted for a compression garment   Time 8   Period Weeks   PT LONG TERM GOAL #4   Title Pt to be able to lift LE onto the treatment table to allow ease of getting in and our of bed   Time 8   Period Weeks   PT LONG TERM GOAL #5   Title Pt to state she is able to walk for 10 minutes without difficulty ( pt limited to 10 minutes due to back issues)   Time 8   Period Weeks        Date 02/14/2015  02/20/2015    Rt Lt Rt LT  MTP 23.00  23.00 23 23  ankle 34.2 38.3 33.60 34.40  4cm 42.50 37.30 33.00 33.00  8cm 45.50 43.10 41.00 40.50  12 cm 47.80 46.60 44.30 44.40  16cm 50.20 48.70 47.00 46.50  20cm 55.80 50.80 49.00 48.00  24cm 57.00 52.00 51.20 49.50  28cm 56.00 56.60 53.40 52.50  32cm 53.20 58.30 55.40 58.50  36cm 60.20 56.50 54.50 58.30  40cm 63.20 56.80 52.00 54.00  44cm 63.00 63.20 55.00 58.00  48cm 64.30 64.00 58.00 62.00                                Sum of squares 38430.67 36183.86 31605.86 33030.86  Total Volume 12232.87 11517.68 10060.46 10514.05            Plan - 02/20/15 1258    Clinical Impression Statement Pt comes today with short stretch bandages.  Remeasured LE's with overall reduction of 2,172.41cc from [place] and 1,003.63 cc from [place].  Pt has also lost 3# since last week.  Began with short stretch bandaging following MLD today to knee level only.  Pt reported overall comfort at end of session.  Pt instructed to remove bandages if became uncomfortatble or noticed decreased circulation or rubbing.  Pt and daughter verbalized understanding.      Pt will benefit from skilled therapeutic intervention in order to improve on the following deficits Pain;Obesity;Difficulty walking;Increased edema   PT Next Visit Plan conitnue with bandaging to knee level only X 1 more session then proceed in wrapping the entire LE Bilaterally if responds favorably.          Problem List Patient Active Problem List   Diagnosis Date Noted  . Normal coronary arteries 2009 01/14/2015  . Obesity-BMI 40 01/14/2015  . Restless leg 01/14/2015  . Chest pain 01/14/2015  . Back pain 01/14/2015  . Renal insufficiency 01/14/2015  . Dementia- mild memory  issues 01/14/2015  . Occult blood positive stool 12/14/2014  . Anemia 12/14/2014  . Family history of colon cancer 12/14/2014  . Change in bowel habits 12/14/2014  . GERD (gastroesophageal reflux disease) 12/14/2014  . Dyspnea 05/17/2012  . Lymphedema 05/17/2012   . Weight gain 05/17/2012  . HTN (hypertension) 05/17/2012    [name], PTA/CLT [phone]  02/20/2015, 1:02 PM  [facility] [address] [place], [place], [address] Phone: [phone]   Fax:  [phone]

## 2015-02-20 NOTE — Telephone Encounter (Signed)
Encounter complete. 

## 2015-02-20 NOTE — Telephone Encounter (Signed)
This patient was seen in April for bowel movement changes, diarrhea and intermittent diarrhea. The colonoscopy was normal and will be recalled in 5 years. She calls today with complaints of constipation. She reports daily use of Miralax and Amitiza 24 mcg. She remembers you saying you would let her try something else if the constipation again. Please advise.

## 2015-02-21 ENCOUNTER — Other Ambulatory Visit: Payer: Self-pay

## 2015-02-21 ENCOUNTER — Ambulatory Visit (HOSPITAL_COMMUNITY)
Admission: RE | Admit: 2015-02-21 | Discharge: 2015-02-21 | Disposition: A | Discharge status: Home / Self Care | Payer: Medicare Other | Source: Ambulatory Visit | Attending: Cardiology | Admitting: Cardiology

## 2015-02-21 ENCOUNTER — Telehealth (HOSPITAL_COMMUNITY): Payer: Self-pay

## 2015-02-21 DIAGNOSIS — R0789 Other chest pain: Secondary | ICD-10-CM | POA: Diagnosis not present

## 2015-02-21 DIAGNOSIS — R079 Chest pain, unspecified: Secondary | ICD-10-CM | POA: Diagnosis not present

## 2015-02-21 DIAGNOSIS — R0602 Shortness of breath: Secondary | ICD-10-CM | POA: Diagnosis present

## 2015-02-21 MED ORDER — REGADENOSON 0.4 MG/5ML IV SOLN
0.4000 mg | Freq: Once | INTRAVENOUS | Status: AC
  Administered 2015-02-21: 0.4 mg via INTRAVENOUS

## 2015-02-21 MED ORDER — AMINOPHYLLINE 25 MG/ML IV SOLN
75.0000 mg | Freq: Once | INTRAVENOUS | Status: AC
  Administered 2015-02-21: 75 mg via INTRAVENOUS

## 2015-02-21 MED ORDER — TECHNETIUM TC 99M SESTAMIBI GENERIC - CARDIOLITE
33.0000 | Freq: Once | INTRAVENOUS | Status: AC | PRN
  Administered 2015-02-21: 33 via INTRAVENOUS

## 2015-02-21 MED ORDER — LINACLOTIDE 145 MCG PO CAPS: ORAL_CAPSULE | ORAL | Status: DC

## 2015-02-21 NOTE — Telephone Encounter (Signed)
Encounter complete. 

## 2015-02-21 NOTE — Telephone Encounter (Signed)
No answer. No voicemail. 

## 2015-02-21 NOTE — Telephone Encounter (Signed)
Patient advised. Rx to the pharmacy.

## 2015-02-22 ENCOUNTER — Ambulatory Visit (HOSPITAL_COMMUNITY)
Admission: RE | Admit: 2015-02-22 | Discharge: 2015-02-22 | Disposition: A | Discharge status: Home / Self Care | Payer: Medicare Other | Source: Ambulatory Visit | Attending: Cardiology | Admitting: Cardiology

## 2015-02-22 LAB — MYOCARDIAL PERFUSION IMAGING
CHL CUP NUCLEAR SDS: 0
CHL CUP RESTING HR STRESS: 61 {beats}/min
LV dias vol: 124 mL
LV sys vol: 54 mL
NUC STRESS TID: 1
Peak HR: 76 {beats}/min
SRS: 3
SSS: 3

## 2015-02-22 MED ORDER — TECHNETIUM TC 99M SESTAMIBI GENERIC - CARDIOLITE
33.0000 | Freq: Once | INTRAVENOUS | Status: AC | PRN
  Administered 2015-02-22: 33 via INTRAVENOUS

## 2015-02-28 ENCOUNTER — Ambulatory Visit (HOSPITAL_COMMUNITY): Payer: Medicare Other | Attending: Family Medicine | Admitting: Physical Therapy

## 2015-02-28 DIAGNOSIS — I890 Lymphedema, not elsewhere classified: Secondary | ICD-10-CM | POA: Insufficient documentation

## 2015-02-28 DIAGNOSIS — R262 Difficulty in walking, not elsewhere classified: Secondary | ICD-10-CM | POA: Insufficient documentation

## 2015-02-28 NOTE — Therapy (Signed)
[place], [place], [address] Phone: [phone]   Fax:  [phone]  Physical Therapy Treatment  Patient Details  Name: [name] MRN: [id number] Date of Birth: 1946-03-26 Referring Provider:  Dione Housekeeper, MD  Encounter Date: 02/28/2015      PT End of Session - 02/28/15 0927    Visit Number 4   Number of Visits 24   Authorization Type medicare   Authorization - Visit Number 4   Authorization - Number of Visits 10   PT Start Time 0805   PT Stop Time 0925   PT Time Calculation (min) 80 min   Activity Tolerance Patient tolerated treatment well   Behavior During Therapy [facility] for tasks assessed/performed      Past Medical History  Diagnosis Date  . Rotator cuff injury   . Fatigue   . Edema   . Hyperlipidemia   . Depression   . Anxiety   . Syncope   . Chest pain     Normal cardiac cath 5/09  . Dumping syndrome   . Lumbar spondylosis   . Cervical spondylosis with myelopathy   . Hypertension   . Fibromyalgia   . PONV (postoperative nausea and vomiting)   . Restless leg syndrome   . Hypothyroidism   . Anemia   . Tremors of nervous system   . Arthritis   . Brain lesion     2 types  . Stroke     TIAs "mini strokes"- unsure of last TIA  . Lymphedema     seeing specialist for this    Past Surgical History  Procedure Laterality Date  . Rotator cuff repair Bilateral   . Total knee arthroplasty Left   . Appendectomy  1966  . Lumbar disc surgery      x2  . Total abdominal hysterectomy    . Total knee arthroplasty Right 11/18/2012    Procedure: TOTAL KNEE ARTHROPLASTY;  Surgeon: Yvette Rack., MD;  Location: [facility];  Service: Orthopedics;  Laterality: Right;  . Knee arthroscopy Bilateral   . Cholecystectomy  2015  . Hip bursa surgery Bilateral   . Lumbar fusion    . Coronary angiogram  2009    Normal coronaries  . Cholecystectomy  2015    There were no vitals filed for this  visit.  Visit Diagnosis:  Lymphedema  Difficulty walking      Subjective Assessment - 02/28/15 0931    Subjective PT states the bandages were comfortable.  States her legs began aching but thinks it was because of her arthritis not the bandages.  No pain.   Currently in Pain? No/denies                         [facility] Adult PT Treatment/Exercise - 02/28/15 0926    Manual Therapy   Manual Therapy Manual Lymphatic Drainage (MLD)   Manual therapy comments bandaging to bilateral LE's knee level using multilayered short stretch bandaging, 1/2" foam following MLD   Manual Lymphatic Drainage (MLD) bilateral LE via inguinal axillary anastmosis anteriorly and posteriorly                  PT Short Term Goals - 02/14/15 1633    PT SHORT TERM GOAL #1   Title Pt to be I in skin care and care of bandages   Time 2   Period Weeks   PT SHORT TERM GOAL #2  Title Pt to verbalize precautions for decreasing risk of infection   Time 2   Period Weeks   PT SHORT TERM GOAL #3   Title reduce volume by 30%   Time 4   Period Weeks           PT Long Term Goals - 02/14/15 1636    PT LONG TERM GOAL #1   Title Reduce volume by 50%   Time 8   Period Weeks   PT LONG TERM GOAL #2   Title Pt to verbalize understanding of the maintenance phase of tx   Time 8   Period Weeks   PT LONG TERM GOAL #3   Title Pt to understand where and how often to be fitted for a compression garment   Time 8   Period Weeks   PT LONG TERM GOAL #4   Title Pt to be able to lift LE onto the treatment table to allow ease of getting in and our of bed   Time 8   Period Weeks   PT LONG TERM GOAL #5   Title Pt to state she is able to walk for 10 minutes without difficulty ( pt limited to 10 minutes due to back issues)   Time 8   Period Weeks               Plan - 02/28/15 4656    Clinical Impression Statement Pt with good results following last bandaging.  Daughter took pictures of LE's  when removed bandages with noted good decompression.  MLD completed anteriorly and posteriorly f/b bandaging to knee level.  Toe bandages not utilzied this session as patient with difficulty keeping them on and no swelling noted into forefoot or toes.  Educated on next phase of treatment beginning Monday and foam cut for upper thighs.   Pt will benefit from skilled therapeutic intervention in order to improve on the following deficits Pain;Obesity;Difficulty walking;Increased edema   PT Next Visit Plan   Proceed in wrapping the entire LE Bilaterally if responds favorably (foam is already cut).          Problem List Patient Active Problem List   Diagnosis Date Noted  . Normal coronary arteries 2009 01/14/2015  . Obesity-BMI 40 01/14/2015  . Restless leg 01/14/2015  . Chest pain 01/14/2015  . Back pain 01/14/2015  . Renal insufficiency 01/14/2015  . Dementia- mild memory issues 01/14/2015  . Occult blood positive stool 12/14/2014  . Anemia 12/14/2014  . Family history of colon cancer 12/14/2014  . Change in bowel habits 12/14/2014  . GERD (gastroesophageal reflux disease) 12/14/2014  . Dyspnea 05/17/2012  . Lymphedema 05/17/2012  . Weight gain 05/17/2012  . HTN (hypertension) 05/17/2012    [name] 02/28/2015, 9:33 AM  [facility] [address] [place], [place], [address] Phone: [phone]   Fax:  [phone]

## 2015-03-04 ENCOUNTER — Ambulatory Visit (HOSPITAL_COMMUNITY): Payer: Medicare Other | Admitting: Physical Therapy

## 2015-03-04 DIAGNOSIS — R262 Difficulty in walking, not elsewhere classified: Secondary | ICD-10-CM

## 2015-03-04 DIAGNOSIS — I890 Lymphedema, not elsewhere classified: Secondary | ICD-10-CM | POA: Diagnosis not present

## 2015-03-04 NOTE — Therapy (Signed)
[place], [place], [address] Phone: [phone]   Fax:  [phone]  Physical Therapy Treatment  Patient Details  Name: [name] MRN: [id number] Date of Birth: Jul 24, 1946 Referring Provider:  Dione Housekeeper, MD  Encounter Date: 03/04/2015      PT End of Session - 03/04/15 1857    Visit Number 5   Number of Visits 24   Authorization Type medicare   Authorization - Visit Number 5   Authorization - Number of Visits 10   PT Start Time 3716   PT Stop Time 1430   PT Time Calculation (min) 86 min   Activity Tolerance Patient tolerated treatment well   Behavior During Therapy [facility] for tasks assessed/performed      Past Medical History  Diagnosis Date  . Rotator cuff injury   . Fatigue   . Edema   . Hyperlipidemia   . Depression   . Anxiety   . Syncope   . Chest pain     Normal cardiac cath 5/09  . Dumping syndrome   . Lumbar spondylosis   . Cervical spondylosis with myelopathy   . Hypertension   . Fibromyalgia   . PONV (postoperative nausea and vomiting)   . Restless leg syndrome   . Hypothyroidism   . Anemia   . Tremors of nervous system   . Arthritis   . Brain lesion     2 types  . Stroke     TIAs "mini strokes"- unsure of last TIA  . Lymphedema     seeing specialist for this    Past Surgical History  Procedure Laterality Date  . Rotator cuff repair Bilateral   . Total knee arthroplasty Left   . Appendectomy  1966  . Lumbar disc surgery      x2  . Total abdominal hysterectomy    . Total knee arthroplasty Right 11/18/2012    Procedure: TOTAL KNEE ARTHROPLASTY;  Surgeon: Yvette Rack., MD;  Location: [facility];  Service: Orthopedics;  Laterality: Right;  . Knee arthroscopy Bilateral   . Cholecystectomy  2015  . Hip bursa surgery Bilateral   . Lumbar fusion    . Coronary angiogram  2009    Normal coronaries  . Cholecystectomy  2015    There were no vitals filed for this  visit.  Visit Diagnosis:  Lymphedema  Difficulty walking      Subjective Assessment - 03/04/15 1855    Subjective Pt states she kept them on like she was suppose to.  Daughter reports notable improvement in her LE's.   Currently in Pain? No/denies                         [facility] Adult PT Treatment/Exercise - 03/04/15 1856    Manual Therapy   Manual Therapy Manual Lymphatic Drainage (MLD)   Manual therapy comments bandaging full bilateral LE's using multi layered short stretch bandaging, 1/2" foam following MLD   Manual Lymphatic Drainage (MLD) bilateral LE via inguinal axillary anastmosis anteriorly and posteriorly                  PT Short Term Goals - 02/14/15 1633    PT SHORT TERM GOAL #1   Title Pt to be I in skin care and care of bandages   Time 2   Period Weeks   PT SHORT TERM GOAL #2   Title Pt to verbalize precautions for  decreasing risk of infection   Time 2   Period Weeks   PT SHORT TERM GOAL #3   Title reduce volume by 30%   Time 4   Period Weeks           PT Long Term Goals - 02/14/15 1636    PT LONG TERM GOAL #1   Title Reduce volume by 50%   Time 8   Period Weeks   PT LONG TERM GOAL #2   Title Pt to verbalize understanding of the maintenance phase of tx   Time 8   Period Weeks   PT LONG TERM GOAL #3   Title Pt to understand where and how often to be fitted for a compression garment   Time 8   Period Weeks   PT LONG TERM GOAL #4   Title Pt to be able to lift LE onto the treatment table to allow ease of getting in and our of bed   Time 8   Period Weeks   PT LONG TERM GOAL #5   Title Pt to state she is able to walk for 10 minutes without difficulty ( pt limited to 10 minutes due to back issues)   Time 8   Period Weeks               Plan - 03/04/15 1858    Clinical Impression Statement LE's responding favorably to MLD and compression bandaging.  Began full lbandaging today of LE's.  PT reported comfort and EOS.   Instructed patient to remove bandages if LE's began to hurt or felt numbness.  Pt verbalized agreement.     Pt will benefit from skilled therapeutic intervention in order to improve on the following deficits Pain;Obesity;Difficulty walking;Increased edema   PT Next Visit Plan  continue with complete lymphedema therapy with full bilateral LE bandaging.  Remeasure end of this week.         Problem List Patient Active Problem List   Diagnosis Date Noted  . Normal coronary arteries 2009 01/14/2015  . Obesity-BMI 40 01/14/2015  . Restless leg 01/14/2015  . Chest pain 01/14/2015  . Back pain 01/14/2015  . Renal insufficiency 01/14/2015  . Dementia- mild memory issues 01/14/2015  . Occult blood positive stool 12/14/2014  . Anemia 12/14/2014  . Family history of colon cancer 12/14/2014  . Change in bowel habits 12/14/2014  . GERD (gastroesophageal reflux disease) 12/14/2014  . Dyspnea 05/17/2012  . Lymphedema 05/17/2012  . Weight gain 05/17/2012  . HTN (hypertension) 05/17/2012    [name], PTA/CLT [phone]  03/04/2015, 7:01 PM  [facility] [address] [place], [place], [address] Phone: [phone]   Fax:  [phone]

## 2015-03-05 ENCOUNTER — Other Ambulatory Visit: Payer: Self-pay | Admitting: Internal Medicine

## 2015-03-05 ENCOUNTER — Other Ambulatory Visit: Payer: Self-pay

## 2015-03-05 ENCOUNTER — Ambulatory Visit (HOSPITAL_COMMUNITY): Payer: Medicare Other | Attending: Internal Medicine

## 2015-03-05 DIAGNOSIS — R0602 Shortness of breath: Secondary | ICD-10-CM | POA: Diagnosis not present

## 2015-03-05 DIAGNOSIS — R079 Chest pain, unspecified: Secondary | ICD-10-CM | POA: Diagnosis present

## 2015-03-05 DIAGNOSIS — R0789 Other chest pain: Secondary | ICD-10-CM | POA: Diagnosis not present

## 2015-03-05 DIAGNOSIS — I517 Cardiomegaly: Secondary | ICD-10-CM | POA: Diagnosis not present

## 2015-03-05 DIAGNOSIS — I071 Rheumatic tricuspid insufficiency: Secondary | ICD-10-CM | POA: Diagnosis not present

## 2015-03-06 ENCOUNTER — Ambulatory Visit (HOSPITAL_COMMUNITY): Payer: Medicare Other | Admitting: Physical Therapy

## 2015-03-06 DIAGNOSIS — I890 Lymphedema, not elsewhere classified: Secondary | ICD-10-CM

## 2015-03-06 DIAGNOSIS — R262 Difficulty in walking, not elsewhere classified: Secondary | ICD-10-CM

## 2015-03-06 NOTE — Therapy (Signed)
[place], [place], [address] Phone: [phone]   Fax:  [phone]  Physical Therapy Treatment  Patient Details  Name: [name] MRN: [id number] Date of Birth: 06/05/1946 Referring Provider:  Dione Housekeeper, MD  Encounter Date: 03/06/2015      PT End of Session - 03/06/15 1233    Visit Number 6   Number of Visits 24   Date for PT Re-Evaluation 04/15/15   Authorization Type medicare   Authorization - Visit Number 6   Authorization - Number of Visits 10   PT Start Time 1020   PT Stop Time 1147   PT Time Calculation (min) 87 min   Activity Tolerance Patient tolerated treatment well      Past Medical History  Diagnosis Date  . Rotator cuff injury   . Fatigue   . Edema   . Hyperlipidemia   . Depression   . Anxiety   . Syncope   . Chest pain     Normal cardiac cath 5/09  . Dumping syndrome   . Lumbar spondylosis   . Cervical spondylosis with myelopathy   . Hypertension   . Fibromyalgia   . PONV (postoperative nausea and vomiting)   . Restless leg syndrome   . Hypothyroidism   . Anemia   . Tremors of nervous system   . Arthritis   . Brain lesion     2 types  . Stroke     TIAs "mini strokes"- unsure of last TIA  . Lymphedema     seeing specialist for this    Past Surgical History  Procedure Laterality Date  . Rotator cuff repair Bilateral   . Total knee arthroplasty Left   . Appendectomy  1966  . Lumbar disc surgery      x2  . Total abdominal hysterectomy    . Total knee arthroplasty Right 11/18/2012    Procedure: TOTAL KNEE ARTHROPLASTY;  Surgeon: Yvette Rack., MD;  Location: [facility];  Service: Orthopedics;  Laterality: Right;  . Knee arthroscopy Bilateral   . Cholecystectomy  2015  . Hip bursa surgery Bilateral   . Lumbar fusion    . Coronary angiogram  2009    Normal coronaries  . Cholecystectomy  2015    There were no vitals filed for this visit.  Visit Diagnosis:   Lymphedema  Difficulty walking      Subjective Assessment - 03/06/15 1232    Subjective Pt states she noticed increased urination after last treatment.  Pt noticing increased abdominal fluid.  Therapist recommended abdominal compression    Pertinent History +lymphedema; back surgery; B TKR; shoulder replacement.   Currently in Pain? No/denies                         [facility] Adult PT Treatment/Exercise - 03/06/15 0001    Manual Therapy   Manual Therapy Manual Lymphatic Drainage (MLD)   Manual therapy comments bandaging full bilateral LE's using multi layered short stretch bandaging, 1/2" foam following MLD   Manual Lymphatic Drainage (MLD) bilateral LE via inguinal axillary anastmosis anteriorly to include supraclavicular,deep and superficial abdominal and BLE .                  PT Short Term Goals - 02/14/15 1633    PT SHORT TERM GOAL #1   Title Pt to be I in skin care and care of bandages   Time 2  Period Weeks   PT SHORT TERM GOAL #2   Title Pt to verbalize precautions for decreasing risk of infection   Time 2   Period Weeks   PT SHORT TERM GOAL #3   Title reduce volume by 30%   Time 4   Period Weeks           PT Long Term Goals - 02/14/15 1636    PT LONG TERM GOAL #1   Title Reduce volume by 50%   Time 8   Period Weeks   PT LONG TERM GOAL #2   Title Pt to verbalize understanding of the maintenance phase of tx   Time 8   Period Weeks   PT LONG TERM GOAL #3   Title Pt to understand where and how often to be fitted for a compression garment   Time 8   Period Weeks   PT LONG TERM GOAL #4   Title Pt to be able to lift LE onto the treatment table to allow ease of getting in and our of bed   Time 8   Period Weeks   PT LONG TERM GOAL #5   Title Pt to state she is able to walk for 10 minutes without difficulty ( pt limited to 10 minutes due to back issues)   Time 8   Period Weeks               Plan - 03/06/15 1234    Clinical  Impression Statement Pt continues to decongest.  Pt encouraged to walk more to promote lymph drainage.    PT Next Visit Plan Pt to be re-weighed as well as remeassured next treatment.         Problem List Patient Active Problem List   Diagnosis Date Noted  . Normal coronary arteries 2009 01/14/2015  . Obesity-BMI 40 01/14/2015  . Restless leg 01/14/2015  . Chest pain 01/14/2015  . Back pain 01/14/2015  . Renal insufficiency 01/14/2015  . Dementia- mild memory issues 01/14/2015  . Occult blood positive stool 12/14/2014  . Anemia 12/14/2014  . Family history of colon cancer 12/14/2014  . Change in bowel habits 12/14/2014  . GERD (gastroesophageal reflux disease) 12/14/2014  . Dyspnea 05/17/2012  . Lymphedema 05/17/2012  . Weight gain 05/17/2012  . HTN (hypertension) 05/17/2012   Rayetta Humphrey, PT CLT [phone] 03/06/2015, 12:35 PM  [facility] [address] [place], [place], [address] Phone: [phone]   Fax:  [phone]

## 2015-03-08 ENCOUNTER — Ambulatory Visit (HOSPITAL_COMMUNITY): Payer: Medicare Other | Admitting: Physical Therapy

## 2015-03-08 DIAGNOSIS — R262 Difficulty in walking, not elsewhere classified: Secondary | ICD-10-CM

## 2015-03-08 DIAGNOSIS — I890 Lymphedema, not elsewhere classified: Secondary | ICD-10-CM

## 2015-03-08 NOTE — Therapy (Signed)
[place], [place], [address] Phone: [phone]   Fax:  [phone]  Physical Therapy Treatment  Patient Details  Name: [name] MRN: [id number] Date of Birth: 1945/09/28 Referring Provider:  Dione Housekeeper, MD  Encounter Date: 03/08/2015      PT End of Session - 03/08/15 1638    Visit Number 7   Number of Visits 25   Date for PT Re-Evaluation 04/15/15   Authorization Type medicare   Authorization - Visit Number 7   Authorization - Number of Visits 10   PT Start Time 1020   PT Stop Time 6962   PT Time Calculation (min) 85 min      Past Medical History  Diagnosis Date  . Rotator cuff injury   . Fatigue   . Edema   . Hyperlipidemia   . Depression   . Anxiety   . Syncope   . Chest pain     Normal cardiac cath 5/09  . Dumping syndrome   . Lumbar spondylosis   . Cervical spondylosis with myelopathy   . Hypertension   . Fibromyalgia   . PONV (postoperative nausea and vomiting)   . Restless leg syndrome   . Hypothyroidism   . Anemia   . Tremors of nervous system   . Arthritis   . Brain lesion     2 types  . Stroke     TIAs "mini strokes"- unsure of last TIA  . Lymphedema     seeing specialist for this    Past Surgical History  Procedure Laterality Date  . Rotator cuff repair Bilateral   . Total knee arthroplasty Left   . Appendectomy  1966  . Lumbar disc surgery      x2  . Total abdominal hysterectomy    . Total knee arthroplasty Right 11/18/2012    Procedure: TOTAL KNEE ARTHROPLASTY;  Surgeon: Yvette Rack., MD;  Location: [facility];  Service: Orthopedics;  Laterality: Right;  . Knee arthroscopy Bilateral   . Cholecystectomy  2015  . Hip bursa surgery Bilateral   . Lumbar fusion    . Coronary angiogram  2009    Normal coronaries  . Cholecystectomy  2015    There were no vitals filed for this visit.  Visit Diagnosis:  Lymphedema  Difficulty walking      Subjective  Assessment - 03/08/15 1641    Subjective Pt states she bought compression for her abdominal area but was unable to fit into it.  Pt will seek new abdominal compression this weekend. States that it is easier for her to move her legs now.       Date Date 02/14/2015  02/20/2015  03/08/2015 03/08/2015    Rt Lt Rt LT Rt LT  MTP MTP 23.00 23.00 23 23 22.8 22.8  ankle ankle 34.2 38.3 33.60 34.40 34.20 36.20  4cm 4cm 42.50 37.30 33.00 33.00 33.00 35.70  8cm 8cm 45.50 43.10 41.00 40.50 38.50 36.00  12 cm 12 cm 47.80 46.60 44.30 44.40 42.20 41.00  16cm 16cm 50.20 48.70 47.00 46.50 45.00 44.00  20cm 20cm 55.80 50.80 49.00 48.00 48.10 47.00  24cm 24cm 57.00 52.00 51.20 49.50 50.80 49.70  28cm 28cm 56.00 56.60 53.40 52.50 54.00 52.20  32cm 32cm 53.20 58.30 55.40 58.50 56.40 57.80  36cm 36cm 60.20 56.50 54.50 58.30 55.20 58.80  40cm 40cm 63.20 56.80 52.00 54.00 54.30 58.10  44cm 44cm 63.00 63.20 55.00 58.00 59.40 58.00  48cm 48cm  64.30 64.00 58.00 62.00 63.00 63.30                                               Sum of squares Sum of squares 38430.67 83382.50 31605.86 33030.86 32550.67 32966.48  Total Volume Total Volume 12232.87 11517.68 10060.46 10514.05 10361.204 10493.56                      [facility] Adult PT Treatment/Exercise - 03/08/15 0001    Manual Therapy   Manual Therapy Manual Lymphatic Drainage (MLD)   Manual therapy comments bandaging full bilateral LE's using multi layered short stretch bandaging, 1/2" foam following MLD   Manual Lymphatic Drainage (MLD) bilateral LE via inguinal axillary anastmosis anteriorly and posteriorly                  PT Short Term Goals - 02/14/15 1633    PT SHORT TERM GOAL #1   Title Pt to be I in skin care and care of bandages   Time 2   Period Weeks   PT SHORT TERM GOAL #2   Title Pt to verbalize precautions for  decreasing risk of infection   Time 2   Period Weeks   PT SHORT TERM GOAL #3   Title reduce volume by 30%   Time 4   Period Weeks           PT Long Term Goals - 02/14/15 1636    PT LONG TERM GOAL #1   Title Reduce volume by 50%   Time 8   Period Weeks   PT LONG TERM GOAL #2   Title Pt to verbalize understanding of the maintenance phase of tx   Time 8   Period Weeks   PT LONG TERM GOAL #3   Title Pt to understand where and how often to be fitted for a compression garment   Time 8   Period Weeks   PT LONG TERM GOAL #4   Title Pt to be able to lift LE onto the treatment table to allow ease of getting in and our of bed   Time 8   Period Weeks   PT LONG TERM GOAL #5   Title Pt to state she is able to walk for 10 minutes without difficulty ( pt limited to 10 minutes due to back issues)   Time 8   Period Weeks               Plan - 03/08/15 1638    Clinical Impression Statement Pt has increased in fluid in Rt LE with minimal decrease of LT.  Weather has been very hot and humid which may play a role in this.  Pt encouraged to acquire abdominal compression as soon as possible.    PT Next Visit Plan weigh pt next visit.  Continue with total decongestive manual techniques.         Problem List Patient Active Problem List   Diagnosis Date Noted  . Normal coronary arteries 2009 01/14/2015  . Obesity-BMI 40 01/14/2015  . Restless leg 01/14/2015  . Chest pain 01/14/2015  . Back pain 01/14/2015  . Renal insufficiency 01/14/2015  . Dementia- mild memory issues 01/14/2015  . Occult blood positive stool 12/14/2014  . Anemia 12/14/2014  . Family history of colon cancer 12/14/2014  . Change in bowel habits 12/14/2014  .  GERD (gastroesophageal reflux disease) 12/14/2014  . Dyspnea 05/17/2012  . Lymphedema 05/17/2012  . Weight gain 05/17/2012  . HTN (hypertension) 05/17/2012    Rayetta Humphrey, PT CLT [phone] 03/08/2015, 4:42 PM  [facility] [address] [place], [place], [address] Phone: [phone]   Fax:  [phone]

## 2015-03-11 ENCOUNTER — Ambulatory Visit (HOSPITAL_COMMUNITY): Payer: Medicare Other | Admitting: Physical Therapy

## 2015-03-11 DIAGNOSIS — I890 Lymphedema, not elsewhere classified: Secondary | ICD-10-CM

## 2015-03-11 DIAGNOSIS — R262 Difficulty in walking, not elsewhere classified: Secondary | ICD-10-CM

## 2015-03-11 NOTE — Therapy (Signed)
[place], [place], [address] Phone: [phone]   Fax:  [phone]  Physical Therapy Treatment  Patient Details  Name: [name] MRN: [id number] Date of Birth: 12-27-1945 Referring Provider:  Dione Housekeeper, MD  Encounter Date: 03/11/2015      PT End of Session - 03/11/15 1144    Visit Number 8   Number of Visits 25   Date for PT Re-Evaluation 04/15/15   Authorization Type medicare   Authorization - Visit Number 8   Authorization - Number of Visits 10   PT Start Time 0076   PT Stop Time 1140   PT Time Calculation (min) 85 min   Activity Tolerance Patient tolerated treatment well   Behavior During Therapy [facility] for tasks assessed/performed      Past Medical History  Diagnosis Date  . Rotator cuff injury   . Fatigue   . Edema   . Hyperlipidemia   . Depression   . Anxiety   . Syncope   . Chest pain     Normal cardiac cath 5/09  . Dumping syndrome   . Lumbar spondylosis   . Cervical spondylosis with myelopathy   . Hypertension   . Fibromyalgia   . PONV (postoperative nausea and vomiting)   . Restless leg syndrome   . Hypothyroidism   . Anemia   . Tremors of nervous system   . Arthritis   . Brain lesion     2 types  . Stroke     TIAs "mini strokes"- unsure of last TIA  . Lymphedema     seeing specialist for this    Past Surgical History  Procedure Laterality Date  . Rotator cuff repair Bilateral   . Total knee arthroplasty Left   . Appendectomy  1966  . Lumbar disc surgery      x2  . Total abdominal hysterectomy    . Total knee arthroplasty Right 11/18/2012    Procedure: TOTAL KNEE ARTHROPLASTY;  Surgeon: Yvette Rack., MD;  Location: [facility];  Service: Orthopedics;  Laterality: Right;  . Knee arthroscopy Bilateral   . Cholecystectomy  2015  . Hip bursa surgery Bilateral   . Lumbar fusion    . Coronary angiogram  2009    Normal coronaries  . Cholecystectomy  2015    There  were no vitals filed for this visit.  Visit Diagnosis:  Lymphedema  Difficulty walking          [facility] PT Assessment - 03/11/15 0001    Observation/Other Assessments   Observations 261 lb   Posture/Postural Control   Posture Comments wt:  261#                     [facility] Adult PT Treatment/Exercise - 03/11/15 1143    Posture/Postural Control   Posture Comments wt:  261#   Manual Therapy   Manual Therapy Manual Lymphatic Drainage (MLD)   Manual therapy comments bandaging full bilateral LE's using multi layered short stretch bandaging, 1/2" foam following MLD   Manual Lymphatic Drainage (MLD) bilateral LE via inguinal axillary anastmosis anteriorly and posteriorly                  PT Short Term Goals - 02/14/15 1633    PT SHORT TERM GOAL #1   Title Pt to be I in skin care and care of bandages   Time 2   Period Weeks   PT SHORT  TERM GOAL #2   Title Pt to verbalize precautions for decreasing risk of infection   Time 2   Period Weeks   PT SHORT TERM GOAL #3   Title reduce volume by 30%   Time 4   Period Weeks           PT Long Term Goals - 02/14/15 1636    PT LONG TERM GOAL #1   Title Reduce volume by 50%   Time 8   Period Weeks   PT LONG TERM GOAL #2   Title Pt to verbalize understanding of the maintenance phase of tx   Time 8   Period Weeks   PT LONG TERM GOAL #3   Title Pt to understand where and how often to be fitted for a compression garment   Time 8   Period Weeks   PT LONG TERM GOAL #4   Title Pt to be able to lift LE onto the treatment table to allow ease of getting in and our of bed   Time 8   Period Weeks   PT LONG TERM GOAL #5   Title Pt to state she is able to walk for 10 minutes without difficulty ( pt limited to 10 minutes due to back issues)   Time 8   Period Weeks               Plan - 03/11/15 1146    Clinical Impression Statement Pt weighed today wtih noted 8# weight gain since last measurement on 6/29 (2  1/2 weeks ago).  Encouraged patient to walk more and cut out carbs (states she eats toast at night).  Noted increase in fluid in LE's, mainly around ankles.  STates she removed her bandages Saturday night and washed  them.  States she continues to void alot.    PT Next Visit Plan Continue with total decongestive manual techniques with weekly weight and measurements.         Problem List Patient Active Problem List   Diagnosis Date Noted  . Normal coronary arteries 2009 01/14/2015  . Obesity-BMI 40 01/14/2015  . Restless leg 01/14/2015  . Chest pain 01/14/2015  . Back pain 01/14/2015  . Renal insufficiency 01/14/2015  . Dementia- mild memory issues 01/14/2015  . Occult blood positive stool 12/14/2014  . Anemia 12/14/2014  . Family history of colon cancer 12/14/2014  . Change in bowel habits 12/14/2014  . GERD (gastroesophageal reflux disease) 12/14/2014  . Dyspnea 05/17/2012  . Lymphedema 05/17/2012  . Weight gain 05/17/2012  . HTN (hypertension) 05/17/2012    [name], PTA/CLT [phone]  03/11/2015, 11:51 AM  [facility] [address] [place], [place], [address] Phone: [phone]   Fax:  [phone]

## 2015-03-13 ENCOUNTER — Ambulatory Visit (HOSPITAL_COMMUNITY): Payer: Medicare Other | Admitting: Physical Therapy

## 2015-03-13 DIAGNOSIS — I890 Lymphedema, not elsewhere classified: Secondary | ICD-10-CM | POA: Diagnosis not present

## 2015-03-13 DIAGNOSIS — R262 Difficulty in walking, not elsewhere classified: Secondary | ICD-10-CM

## 2015-03-13 NOTE — Therapy (Signed)
[place], [place], [address] Phone: [phone]   Fax:  [phone]  Physical Therapy Treatment  Patient Details  Name: [name] MRN: [id number] Date of Birth: April 25, 1946 Referring Provider:  Dione Housekeeper, MD  Encounter Date: 03/13/2015      PT End of Session - 03/13/15 1646    Visit Number 9   Number of Visits 25   Date for PT Re-Evaluation 04/15/15   Authorization Type medicare   Authorization - Visit Number 9   Authorization - Number of Visits 10   PT Start Time 7425   PT Stop Time 1642   PT Time Calculation (min) 83 min   Activity Tolerance Patient tolerated treatment well      Past Medical History  Diagnosis Date  . Rotator cuff injury   . Fatigue   . Edema   . Hyperlipidemia   . Depression   . Anxiety   . Syncope   . Chest pain     Normal cardiac cath 5/09  . Dumping syndrome   . Lumbar spondylosis   . Cervical spondylosis with myelopathy   . Hypertension   . Fibromyalgia   . PONV (postoperative nausea and vomiting)   . Restless leg syndrome   . Hypothyroidism   . Anemia   . Tremors of nervous system   . Arthritis   . Brain lesion     2 types  . Stroke     TIAs "mini strokes"- unsure of last TIA  . Lymphedema     seeing specialist for this    Past Surgical History  Procedure Laterality Date  . Rotator cuff repair Bilateral   . Total knee arthroplasty Left   . Appendectomy  1966  . Lumbar disc surgery      x2  . Total abdominal hysterectomy    . Total knee arthroplasty Right 11/18/2012    Procedure: TOTAL KNEE ARTHROPLASTY;  Surgeon: Yvette Rack., MD;  Location: [facility];  Service: Orthopedics;  Laterality: Right;  . Knee arthroscopy Bilateral   . Cholecystectomy  2015  . Hip bursa surgery Bilateral   . Lumbar fusion    . Coronary angiogram  2009    Normal coronaries  . Cholecystectomy  2015    There were no vitals filed for this visit.  Visit Diagnosis:   Lymphedema  Difficulty walking      Subjective Assessment - 03/13/15 1647    Subjective Pt comes to the department with noted increased swelling.  Pt has been to two MD visits and has been up all day without bandages as she wanted to have the bandages wrapped for the therapy session.  Pt states that she has obtained abdominal compression via a lumbar corset from [place] .  Pt states that she is able to move her legs easier now.  States that she realizes it has only been two days but she has stayed on her diet.    Currently in Pain? No/denies              [facility] Adult PT Treatment/Exercise - 03/13/15 1645    Manual Therapy   Manual Therapy Manual Lymphatic Drainage (MLD)   Manual therapy comments bandaging full bilateral LE's using multi layered short stretch bandaging, 1/2" foam following MLD   Manual Lymphatic Drainage (MLD) bilateral LE via inguinal axillary anastmosis anteriorly and posteriorly  PT Short Term Goals - 02/14/15 1633    PT SHORT TERM GOAL #1   Title Pt to be I in skin care and care of bandages   Time 2   Period Weeks   PT SHORT TERM GOAL #2   Title Pt to verbalize precautions for decreasing risk of infection   Time 2   Period Weeks   PT SHORT TERM GOAL #3   Title reduce volume by 30%   Time 4   Period Weeks           PT Long Term Goals - 02/14/15 1636    PT LONG TERM GOAL #1   Title Reduce volume by 50%   Time 8   Period Weeks   PT LONG TERM GOAL #2   Title Pt to verbalize understanding of the maintenance phase of tx   Time 8   Period Weeks   PT LONG TERM GOAL #3   Title Pt to understand where and how often to be fitted for a compression garment   Time 8   Period Weeks   PT LONG TERM GOAL #4   Title Pt to be able to lift LE onto the treatment table to allow ease of getting in and our of bed   Time 8   Period Weeks   PT LONG TERM GOAL #5   Title Pt to state she is able to walk for 10 minutes without  difficulty ( pt limited to 10 minutes due to back issues)   Time 8   Period Weeks               Plan - 03/13/15 1649    Clinical Impression Statement Pt with increased swelling today.  Added extra wraps for thigh area.  Pt states that she continues to void more after treatment.  Therapist assited pt donning lumbar support .   PT Next Visit Plan Continue with total decongestive manual techniques with weekly weight and measurements.         Problem List Patient Active Problem List   Diagnosis Date Noted  . Normal coronary arteries 2009 01/14/2015  . Obesity-BMI 40 01/14/2015  . Restless leg 01/14/2015  . Chest pain 01/14/2015  . Back pain 01/14/2015  . Renal insufficiency 01/14/2015  . Dementia- mild memory issues 01/14/2015  . Occult blood positive stool 12/14/2014  . Anemia 12/14/2014  . Family history of colon cancer 12/14/2014  . Change in bowel habits 12/14/2014  . GERD (gastroesophageal reflux disease) 12/14/2014  . Dyspnea 05/17/2012  . Lymphedema 05/17/2012  . Weight gain 05/17/2012  . HTN (hypertension) 05/17/2012   Rayetta Humphrey, PT CLT [phone] 03/13/2015, 4:51 PM  [facility] [address] [place], [place], [address] Phone: [phone]   Fax:  [phone]

## 2015-03-15 ENCOUNTER — Ambulatory Visit (HOSPITAL_COMMUNITY): Payer: Medicare Other | Admitting: Physical Therapy

## 2015-03-18 ENCOUNTER — Ambulatory Visit (HOSPITAL_COMMUNITY): Payer: Medicare Other | Admitting: Physical Therapy

## 2015-03-18 DIAGNOSIS — I890 Lymphedema, not elsewhere classified: Secondary | ICD-10-CM | POA: Diagnosis not present

## 2015-03-18 DIAGNOSIS — R262 Difficulty in walking, not elsewhere classified: Secondary | ICD-10-CM

## 2015-03-18 NOTE — Therapy (Addendum)
[place], [place], [address] Phone: [phone]   Fax:  [phone]  Physical Therapy Treatment  Patient Details  Name: [name] MRN: [id number] Date of Birth: 02/23/1946 Referring Provider:  Dione Housekeeper, MD  Encounter Date: 03/18/2015      PT End of Session - 03/20/15 1516    Visit Number 11   Number of Visits 25   Date for PT Re-Evaluation 04/15/15   Authorization Type medicare   Authorization - Visit Number 11   Authorization - Number of Visits 20   PT Start Time 0626   PT Stop Time 1515   PT Time Calculation (min) 71 min   Activity Tolerance Patient tolerated treatment well   Behavior During Therapy [facility] for tasks assessed/performed      Past Medical History  Diagnosis Date  . Rotator cuff injury   . Fatigue   . Edema   . Hyperlipidemia   . Depression   . Anxiety   . Syncope   . Chest pain     Normal cardiac cath 5/09  . Dumping syndrome   . Lumbar spondylosis   . Cervical spondylosis with myelopathy   . Hypertension   . Fibromyalgia   . PONV (postoperative nausea and vomiting)   . Restless leg syndrome   . Hypothyroidism   . Anemia   . Tremors of nervous system   . Arthritis   . Brain lesion     2 types  . Stroke     TIAs "mini strokes"- unsure of last TIA  . Lymphedema     seeing specialist for this    Past Surgical History  Procedure Laterality Date  . Rotator cuff repair Bilateral   . Total knee arthroplasty Left   . Appendectomy  1966  . Lumbar disc surgery      x2  . Total abdominal hysterectomy    . Total knee arthroplasty Right 11/18/2012    Procedure: TOTAL KNEE ARTHROPLASTY;  Surgeon: Yvette Rack., MD;  Location: [facility];  Service: Orthopedics;  Laterality: Right;  . Knee arthroscopy Bilateral   . Cholecystectomy  2015  . Hip bursa surgery Bilateral   . Lumbar fusion    . Coronary angiogram  2009    Normal coronaries  . Cholecystectomy  2015     There were no vitals filed for this visit.  Visit Diagnosis:  Lymphedema  Difficulty walking      Subjective Assessment - 03/20/15 1514    Subjective PT states she went back to the MD and he did not put her back on fluid pills since she is loosing fluid without them.  Weighed today at 252 #.  Pt without pain.     PT states she can tell she's lost some fluid, however reports her foot and ankles were hurting so bad after last session that she had to remove her bandages.  Currently without pain.      [facility] Adult PT Treatment/Exercise - 03/20/15 1515    Posture/Postural Control   Posture Comments weight 252 #   Manual Therapy   Manual Therapy Manual Lymphatic Drainage (MLD)   Manual therapy comments bandaging full bilateral LE's using multi layered short stretch bandaging, 1/2" foam following MLD   Manual Lymphatic Drainage (MLD) bilateral LE via inguinal axillary anastmosis anteriorly and posteriorly        Date 02/14/2015  02/20/2015  02/20/2015  03/18/2015    Rt Lt Rt LT Rt  Lt Rt Lt  MTP 23.00 23.00 23 23 22.8 22.8 22.5 22.5  ankle 34.2 38.3 33.60 34.40 34.20 36.20 34.00 35.50  4cm 42.50 37.30 33.00 33.00 33.00 35.70 35.30 34.50  8cm 45.50 43.10 41.00 40.50 38.50 36.00 41.00 42.00  12 cm 47.80 46.60 44.30 44.40 42.20 41.00 43.20 45.00  16cm 50.20 48.70 47.00 46.50 45.00 44.00 46.00 47.00  20cm 55.80 50.80 49.00 48.00 48.10 47.00 50.00 48.80  24cm 57.00 52.00 51.20 49.50 50.80 49.70 51.00 49.80  28cm 56.00 56.60 53.40 52.50 54.00 52.20 53.00 53.00  32cm 53.20 58.30 55.40 58.50 56.40 57.80 54.00 56.50  36cm 60.20 56.50 54.50 58.30 55.20 58.80 52.00 54.00  40cm 63.20 56.80 52.00 54.00 54.30 58.10 49.00 52.50  44cm 63.00 63.20 55.00 58.00 59.40 58.00 53.00 63.00  48cm 64.30 64.00 58.00 62.00 63.00 63.30 61.00 64.00                                                    Sum of squares 38430.67 36183.86 31605.86 33030.86 32550.67 32966.48 31032.58 33554.73  Total Volume  12232.87 11517.68 10060.46 10514.05 10361.2 10493.56 9877.981 10680.81          [facility] Adult PT Treatment/Exercise - 03/20/15 1515    Posture/Postural Control   Posture Comments weight 252 #   Manual Therapy   Manual Therapy Manual Lymphatic Drainage (MLD)   Manual therapy comments bandaging full bilateral LE's using multi layered short stretch bandaging, 1/2" foam following MLD   Manual Lymphatic Drainage (MLD) bilateral LE via inguinal axillary anastmosis anteriorly and posteriorly     Pt received CDT for lymphedema for B LE including manual and bandaging using foam and multi-layer  short stretch bandages.           PT Short Term Goals - 03/18/15 1740    PT SHORT TERM GOAL #1   Title Pt to be I in skin care and care of bandages   Time 2   Period Weeks   Status On-going   PT SHORT TERM GOAL #2   Title Pt to verbalize precautions for decreasing risk of infection   Time 2   Period Weeks   Status Achieved   PT SHORT TERM GOAL #3   Title reduce volume by 30%   Time 4   Period Weeks   Status Achieved           PT Long Term Goals - 03/18/15 1740    PT LONG TERM GOAL #1   Title Reduce volume by 50%   Time 8   Period Weeks   Status On-going   PT LONG TERM GOAL #2   Title Pt to verbalize understanding of the maintenance phase of tx   Time 8   Period Weeks   Status On-going   PT LONG TERM GOAL #3   Title Pt to understand where and how often to be fitted for a compression garment   Time 8   Period Weeks   Status On-going   PT LONG TERM GOAL #4   Title Pt to be able to lift LE onto the treatment table to allow ease of getting in and our of bed   Time 8   Period Weeks   Status On-going   PT LONG TERM GOAL #5   Title Pt to state she is able to walk for 10 minutes  without difficulty ( pt limited to 10 minutes due to back issues)   Time 8   Period Weeks   Status On-going        Bilateral LE's remeasured today for progress.  Pt with reduction of 483.22 cc from  Rt LE and gaion of 187.25 cc in Lt LE since last weeks measurements.  PT has lost a total of 2,354.89 cc from Rt and 83t6.87 cc from Lt since beginning therapy on 02/14/15.  Pt has also lost 8.8 # since last weeks measurements.         Plan - 04-03-2015 1516    Clinical Impression Statement Bilateral LE's remeasured last visit on Apr 03, 2015 for progress. Pt with reduction of 483.22 cc from Rt LE and gain of 187.25 cc in Lt LE since last weeks measurements. PT has lost a total of 2,354.89 cc from Rt and 836.87 cc from Lt since beginning therapy on 02/14/15. Pt has also lost 8.8 # since last weeks measurements.  Pt maintaining same weight as last visit.  Will continue to monitor fluid.   PT Treatment/Interventions Compression bandaging;Manual lymph drainage   PT Next Visit Plan Continue with total decongestive manual techniques with weekly weight and measurements.    Consulted and Agree with Plan of Care Patient          G-Codes - April 03, 2015 12/08/15    Functional Assessment Tool Used --volume   Functional Limitation --   Other PT Primary Current Status (M0102) --CK   Other PT Primary Goal Status (V2536) --CJ      Problem List Patient Active Problem List   Diagnosis Date Noted  . Normal coronary arteries 2009 01/14/2015  . Obesity-BMI 40 01/14/2015  . Restless leg 01/14/2015  . Chest pain 01/14/2015  . Back pain 01/14/2015  . Renal insufficiency 01/14/2015  . Dementia- mild memory issues 01/14/2015  . Occult blood positive stool 12/14/2014  . Anemia 12/14/2014  . Family history of colon cancer 12/14/2014  . Change in bowel habits 12/14/2014  . GERD (gastroesophageal reflux disease) 12/14/2014  . Dyspnea 05/17/2012  . Lymphedema 05/17/2012  . Weight gain 05/17/2012  . HTN (hypertension) 05/17/2012  Physical Therapy Progress Note  Dates of Reporting Period: 02/13/2015 to 04-03-2015  Objective Reports of Subjective Statement: " I can walk better and my legs look better"  Objective  Measurements: see above  Goal Update: see above  Plan: see above   Reason Skilled Services are Required:Pt continues to loose volume.  As soon as this has stabilized she will be fitted for a garment and discharged.    [name], PTA/CLT [place], PT CLT [phone] 03/20/2015, 3:59 PM  [facility] [address] [place], [place], [address] Phone: [phone]   Fax:  [phone]

## 2015-03-20 ENCOUNTER — Ambulatory Visit (HOSPITAL_COMMUNITY): Payer: Medicare Other | Admitting: Physical Therapy

## 2015-03-20 DIAGNOSIS — I890 Lymphedema, not elsewhere classified: Secondary | ICD-10-CM

## 2015-03-20 DIAGNOSIS — R262 Difficulty in walking, not elsewhere classified: Secondary | ICD-10-CM

## 2015-03-20 NOTE — Therapy (Signed)
[place], [place], [address] Phone: [phone]   Fax:  [phone]  Physical Therapy Treatment  Patient Details  Name: [name] MRN: [id number] Date of Birth: [birth year] Referring Provider:  Dione Housekeeper, MD  Encounter Date: 03/20/2015      PT End of Session - 03/20/15 1516    Visit Number 11   Number of Visits 25   Date for PT Re-Evaluation 04/15/15   Authorization Type medicare   Authorization - Visit Number 11   Authorization - Number of Visits 20   PT Start Time 9767   PT Stop Time 1515   PT Time Calculation (min) 71 min   Activity Tolerance Patient tolerated treatment well   Behavior During Therapy [facility] for tasks assessed/performed      Past Medical History  Diagnosis Date  . Rotator cuff injury   . Fatigue   . Edema   . Hyperlipidemia   . Depression   . Anxiety   . Syncope   . Chest pain     Normal cardiac cath 5/09  . Dumping syndrome   . Lumbar spondylosis   . Cervical spondylosis with myelopathy   . Hypertension   . Fibromyalgia   . PONV (postoperative nausea and vomiting)   . Restless leg syndrome   . Hypothyroidism   . Anemia   . Tremors of nervous system   . Arthritis   . Brain lesion     2 types  . Stroke     TIAs "mini strokes"- unsure of last TIA  . Lymphedema     seeing specialist for this    Past Surgical History  Procedure Laterality Date  . Rotator cuff repair Bilateral   . Total knee arthroplasty Left   . Appendectomy  1966  . Lumbar disc surgery      x2  . Total abdominal hysterectomy    . Total knee arthroplasty Right 11/18/2012    Procedure: TOTAL KNEE ARTHROPLASTY;  Surgeon: Yvette Rack., MD;  Location: [facility];  Service: Orthopedics;  Laterality: Right;  . Knee arthroscopy Bilateral   . Cholecystectomy  2015  . Hip bursa surgery Bilateral   . Lumbar fusion    . Coronary angiogram  2009    Normal coronaries  . Cholecystectomy  2015     There were no vitals filed for this visit.  Visit Diagnosis:  Lymphedema  Difficulty walking      Subjective Assessment - 03/20/15 1514    Subjective Pt was 17 minutes late for appt due to road construction.  PT states she went back to the MD and he did not put her back on fluid pills since she is loosing fluid without them.  Weighed today at 252 #.  Pt without pain.                         [facility] Adult PT Treatment/Exercise - 03/20/15 1515    Posture/Postural Control   Posture Comments weight 252 #   Manual Therapy   Manual Therapy Manual Lymphatic Drainage (MLD)   Manual therapy comments bandaging full bilateral LE's using multi layered short stretch bandaging, 1/2" foam following MLD   Manual Lymphatic Drainage (MLD) bilateral LE via inguinal axillary anastmosis anteriorly and posteriorly                  PT Short Term Goals - 03/18/15 1740    PT SHORT  TERM GOAL #1   Title Pt to be I in skin care and care of bandages   Time 2   Period Weeks   Status On-going   PT SHORT TERM GOAL #2   Title Pt to verbalize precautions for decreasing risk of infection   Time 2   Period Weeks   Status Achieved   PT SHORT TERM GOAL #3   Title reduce volume by 30%   Time 4   Period Weeks   Status Achieved           PT Long Term Goals - 03/18/15 1740    PT LONG TERM GOAL #1   Title Reduce volume by 50%   Time 8   Period Weeks   Status On-going   PT LONG TERM GOAL #2   Title Pt to verbalize understanding of the maintenance phase of tx   Time 8   Period Weeks   Status On-going   PT LONG TERM GOAL #3   Title Pt to understand where and how often to be fitted for a compression garment   Time 8   Period Weeks   Status On-going   PT LONG TERM GOAL #4   Title Pt to be able to lift LE onto the treatment table to allow ease of getting in and our of bed   Time 8   Period Weeks   Status On-going   PT LONG TERM GOAL #5   Title Pt to state she is able  to walk for 10 minutes without difficulty ( pt limited to 10 minutes due to back issues)   Time 8   Period Weeks   Status On-going               Plan - 03/20/15 1516    Clinical Impression Statement Bilateral LE's remeasured last visit on 03/18/15 for progress. Pt with reduction of 483.22 cc from Rt LE and gain of 187.25 cc in Lt LE since last weeks measurements. PT has lost a total of 2,354.89 cc from Rt and 836.87 cc from Lt since beginning therapy on 02/14/15. Pt has also lost 8.8 # since last weeks measurements.  Pt maintaining same weight as last visit.  Will continue to monitor fluid.   PT Treatment/Interventions Compression bandaging;Manual lymph drainage   PT Next Visit Plan Continue with total decongestive manual techniques with weekly weight and measurements.    Consulted and Agree with Plan of Care Patient       Problem List Patient Active Problem List   Diagnosis Date Noted  . Normal coronary arteries 2009 01/14/2015  . Obesity-BMI 40 01/14/2015  . Restless leg 01/14/2015  . Chest pain 01/14/2015  . Back pain 01/14/2015  . Renal insufficiency 01/14/2015  . Dementia- mild memory issues 01/14/2015  . Occult blood positive stool 12/14/2014  . Anemia 12/14/2014  . Family history of colon cancer 12/14/2014  . Change in bowel habits 12/14/2014  . GERD (gastroesophageal reflux disease) 12/14/2014  . Dyspnea 05/17/2012  . Lymphedema 05/17/2012  . Weight gain 05/17/2012  . HTN (hypertension) 05/17/2012    [name], PTA/CLT [phone]  03/20/2015, 3:21 PM  [facility] [address] [place], [place], [address] Phone: [phone]   Fax:  [phone]

## 2015-03-22 ENCOUNTER — Ambulatory Visit (HOSPITAL_COMMUNITY): Payer: Medicare Other | Admitting: Physical Therapy

## 2015-03-22 DIAGNOSIS — R262 Difficulty in walking, not elsewhere classified: Secondary | ICD-10-CM

## 2015-03-22 DIAGNOSIS — I890 Lymphedema, not elsewhere classified: Secondary | ICD-10-CM

## 2015-03-22 NOTE — Therapy (Signed)
[place], [place], [address] Phone: [phone]   Fax:  [phone]  Physical Therapy Treatment  Patient Details  Name: [name] MRN: [id number] Date of Birth: 03-22-1946 Referring Provider:  Dione Housekeeper, MD  Encounter Date: 03/22/2015      PT End of Session - 03/22/15 1457    Visit Number 12   Number of Visits 25   Date for PT Re-Evaluation 04/15/15   Authorization Type medicare   Authorization - Visit Number 12   Authorization - Number of Visits 20   PT Start Time 9741   PT Stop Time 1144   PT Time Calculation (min) 86 min   Activity Tolerance Patient tolerated treatment well      Past Medical History  Diagnosis Date  . Rotator cuff injury   . Fatigue   . Edema   . Hyperlipidemia   . Depression   . Anxiety   . Syncope   . Chest pain     Normal cardiac cath 5/09  . Dumping syndrome   . Lumbar spondylosis   . Cervical spondylosis with myelopathy   . Hypertension   . Fibromyalgia   . PONV (postoperative nausea and vomiting)   . Restless leg syndrome   . Hypothyroidism   . Anemia   . Tremors of nervous system   . Arthritis   . Brain lesion     2 types  . Stroke     TIAs "mini strokes"- unsure of last TIA  . Lymphedema     seeing specialist for this    Past Surgical History  Procedure Laterality Date  . Rotator cuff repair Bilateral   . Total knee arthroplasty Left   . Appendectomy  1966  . Lumbar disc surgery      x2  . Total abdominal hysterectomy    . Total knee arthroplasty Right 11/18/2012    Procedure: TOTAL KNEE ARTHROPLASTY;  Surgeon: Yvette Rack., MD;  Location: [facility];  Service: Orthopedics;  Laterality: Right;  . Knee arthroscopy Bilateral   . Cholecystectomy  2015  . Hip bursa surgery Bilateral   . Lumbar fusion    . Coronary angiogram  2009    Normal coronaries  . Cholecystectomy  2015    There were no vitals filed for this visit.  Visit Diagnosis:   Lymphedema  Difficulty walking      Subjective Assessment - 03/22/15 1452    Subjective Pt states that she took her bandages off this morning and her ankles were down but between being up to shower and comming to therapy they have gained significant edema.    Pertinent History +lymphedema; back surgery; B TKR; shoulder replacement.   Currently in Pain? Yes   Pain Score 4    Pain Location Back   Pain Orientation Right;Left;Lower   Pain Descriptors / Indicators Aching                         [facility] Adult PT Treatment/Exercise - 03/22/15 0001    Manual Therapy   Manual Therapy Manual Lymphatic Drainage (MLD)   Manual therapy comments bandaging full bilateral LE's using multi layered short stretch bandaging, 1/2" foam following MLD   Manual Lymphatic Drainage (MLD) bilateral LE via inguinal axillary anastmosis anteriorly including supraclavicular, deep and superficial abdominal and inguinal axillary anastomisis followed by LE.  PT Short Term Goals - 03/18/15 1740    PT SHORT TERM GOAL #1   Title Pt to be I in skin care and care of bandages   Time 2   Period Weeks   Status On-going   PT SHORT TERM GOAL #2   Title Pt to verbalize precautions for decreasing risk of infection   Time 2   Period Weeks   Status Achieved   PT SHORT TERM GOAL #3   Title reduce volume by 30%   Time 4   Period Weeks   Status Achieved           PT Long Term Goals - 03/18/15 1740    PT LONG TERM GOAL #1   Title Reduce volume by 50%   Time 8   Period Weeks   Status On-going   PT LONG TERM GOAL #2   Title Pt to verbalize understanding of the maintenance phase of tx   Time 8   Period Weeks   Status On-going   PT LONG TERM GOAL #3   Title Pt to understand where and how often to be fitted for a compression garment   Time 8   Period Weeks   Status On-going   PT LONG TERM GOAL #4   Title Pt to be able to lift LE onto the treatment table to allow ease of  getting in and our of bed   Time 8   Period Weeks   Status On-going   PT LONG TERM GOAL #5   Title Pt to state she is able to walk for 10 minutes without difficulty ( pt limited to 10 minutes due to back issues)   Time 8   Period Weeks   Status On-going               Plan - 03/22/15 1457    Clinical Impression Statement Pt states that she continues to try and move around more but her back has been increasing in pain which is limiting her mobility.  Therapist stressed the importance of walking and continuing to do LE exercises.    PT Next Visit Plan Continue with total decongestive manual techniques with weekly weight and measurements.         Problem List Patient Active Problem List   Diagnosis Date Noted  . Normal coronary arteries 2009 01/14/2015  . Obesity-BMI 40 01/14/2015  . Restless leg 01/14/2015  . Chest pain 01/14/2015  . Back pain 01/14/2015  . Renal insufficiency 01/14/2015  . Dementia- mild memory issues 01/14/2015  . Occult blood positive stool 12/14/2014  . Anemia 12/14/2014  . Family history of colon cancer 12/14/2014  . Change in bowel habits 12/14/2014  . GERD (gastroesophageal reflux disease) 12/14/2014  . Dyspnea 05/17/2012  . Lymphedema 05/17/2012  . Weight gain 05/17/2012  . HTN (hypertension) 05/17/2012    Azucena Freed PT/CLT [phone] 03/22/2015, 3:03 PM  [facility] [address] [place], [place], [address] Phone: [phone]   Fax:  [phone]

## 2015-03-25 ENCOUNTER — Ambulatory Visit (HOSPITAL_COMMUNITY): Payer: Medicare Other | Attending: Family Medicine | Admitting: Physical Therapy

## 2015-03-25 DIAGNOSIS — R262 Difficulty in walking, not elsewhere classified: Secondary | ICD-10-CM

## 2015-03-25 DIAGNOSIS — I890 Lymphedema, not elsewhere classified: Secondary | ICD-10-CM | POA: Diagnosis not present

## 2015-03-25 NOTE — Therapy (Signed)
[place], [place], [address] Phone: [phone]   Fax:  [phone]  Physical Therapy Treatment  Patient Details  Name: [name] MRN: [id number] Date of Birth: 12/06/1945 Referring Provider:  Dione Housekeeper, MD  Encounter Date: 03/25/2015      PT End of Session - 03/25/15 1056    Visit Number 13   Number of Visits 25   Date for PT Re-Evaluation 04/15/15   Authorization Type medicare   Authorization - Visit Number 13   Authorization - Number of Visits 20   PT Start Time 0930   PT Stop Time 1055   PT Time Calculation (min) 85 min   Activity Tolerance Patient tolerated treatment well      Past Medical History  Diagnosis Date  . Rotator cuff injury   . Fatigue   . Edema   . Hyperlipidemia   . Depression   . Anxiety   . Syncope   . Chest pain     Normal cardiac cath 5/09  . Dumping syndrome   . Lumbar spondylosis   . Cervical spondylosis with myelopathy   . Hypertension   . Fibromyalgia   . PONV (postoperative nausea and vomiting)   . Restless leg syndrome   . Hypothyroidism   . Anemia   . Tremors of nervous system   . Arthritis   . Brain lesion     2 types  . Stroke     TIAs "mini strokes"- unsure of last TIA  . Lymphedema     seeing specialist for this    Past Surgical History  Procedure Laterality Date  . Rotator cuff repair Bilateral   . Total knee arthroplasty Left   . Appendectomy  1966  . Lumbar disc surgery      x2  . Total abdominal hysterectomy    . Total knee arthroplasty Right 11/18/2012    Procedure: TOTAL KNEE ARTHROPLASTY;  Surgeon: Yvette Rack., MD;  Location: [facility];  Service: Orthopedics;  Laterality: Right;  . Knee arthroscopy Bilateral   . Cholecystectomy  2015  . Hip bursa surgery Bilateral   . Lumbar fusion    . Coronary angiogram  2009    Normal coronaries  . Cholecystectomy  2015    There were no vitals filed for this visit.  Visit Diagnosis:   Lymphedema  Difficulty walking      Subjective Assessment - 03/25/15 1054    Subjective Pt states her legs were very small yesterday when she took the bandages off to wash them but her legs continue to gain volume when unwrapped    Pertinent History +lymphedema; back surgery; B TKR; shoulder replacement.   Currently in Pain? Yes   Pain Score 3    Pain Location Back   Pain Orientation Lower   Pain Descriptors / Indicators Aching   Pain Type Chronic pain                [facility] Adult PT Treatment/Exercise - 03/25/15 0001    Posture/Postural Control   Posture Comments 250.9   Manual Therapy   Manual Therapy Manual Lymphatic Drainage (MLD)   Manual therapy comments bandaging full bilateral LE's using multi layered short stretch bandaging, 1/2" foam following MLD   Manual Lymphatic Drainage (MLD) bilateral LE via inguinal axillary anastmosis anteriorly including supraclavicular, deep and superficial abdominal and inguinal axillary anastomisis followed by LE.  PT Short Term Goals - 03/18/15 1740    PT SHORT TERM GOAL #1   Title Pt to be I in skin care and care of bandages   Time 2   Period Weeks   Status On-going   PT SHORT TERM GOAL #2   Title Pt to verbalize precautions for decreasing risk of infection   Time 2   Period Weeks   Status Achieved   PT SHORT TERM GOAL #3   Title reduce volume by 30%   Time 4   Period Weeks   Status Achieved           PT Long Term Goals - 03/18/15 1740    PT LONG TERM GOAL #1   Title Reduce volume by 50%   Time 8   Period Weeks   Status On-going   PT LONG TERM GOAL #2   Title Pt to verbalize understanding of the maintenance phase of tx   Time 8   Period Weeks   Status On-going   PT LONG TERM GOAL #3   Title Pt to understand where and how often to be fitted for a compression garment   Time 8   Period Weeks   Status On-going   PT LONG TERM GOAL #4   Title Pt to be able to lift LE onto the treatment table  to allow ease of getting in and our of bed   Time 8   Period Weeks   Status On-going   PT LONG TERM GOAL #5   Title Pt to state she is able to walk for 10 minutes without difficulty ( pt limited to 10 minutes due to back issues)   Time 8   Period Weeks   Status On-going               Plan - 03/25/15 1057    Clinical Impression Statement Pt left four bandages at home therefore thigh wraps were modified.  Pt has lost one pound since last week.     PT Next Visit Plan Continue with total decongestive manual techniques with weekly weight and measureme next treatment for volume         Problem List Patient Active Problem List   Diagnosis Date Noted  . Normal coronary arteries 2009 01/14/2015  . Obesity-BMI 40 01/14/2015  . Restless leg 01/14/2015  . Chest pain 01/14/2015  . Back pain 01/14/2015  . Renal insufficiency 01/14/2015  . Dementia- mild memory issues 01/14/2015  . Occult blood positive stool 12/14/2014  . Anemia 12/14/2014  . Family history of colon cancer 12/14/2014  . Change in bowel habits 12/14/2014  . GERD (gastroesophageal reflux disease) 12/14/2014  . Dyspnea 05/17/2012  . Lymphedema 05/17/2012  . Weight gain 05/17/2012  . HTN (hypertension) 05/17/2012    Rayetta Humphrey, PT CLT [phone] 03/25/2015, 10:59 AM  [facility] [place], [place], [address] Phone: [phone]   Fax:  [phone]

## 2015-03-27 ENCOUNTER — Ambulatory Visit (HOSPITAL_COMMUNITY): Payer: Medicare Other | Admitting: Physical Therapy

## 2015-03-27 DIAGNOSIS — I890 Lymphedema, not elsewhere classified: Secondary | ICD-10-CM | POA: Diagnosis not present

## 2015-03-27 DIAGNOSIS — R262 Difficulty in walking, not elsewhere classified: Secondary | ICD-10-CM

## 2015-03-27 NOTE — Therapy (Signed)
[place], [place], [address] Phone: [phone]   Fax:  [phone]  Physical Therapy Treatment  Patient Details  Name: [name] MRN: [id number] Date of Birth: 10/15/1945 Referring Provider:  Dione Housekeeper, MD  Encounter Date: 03/27/2015      PT End of Session - 03/27/15 1659    Visit Number 14   Number of Visits 25   Date for PT Re-Evaluation 04/15/15   Authorization Type medicare   Authorization - Visit Number 14   Authorization - Number of Visits 20   PT Start Time 9509   PT Stop Time 1432   PT Time Calculation (min) 77 min   Activity Tolerance Patient tolerated treatment well      Past Medical History  Diagnosis Date  . Rotator cuff injury   . Fatigue   . Edema   . Hyperlipidemia   . Depression   . Anxiety   . Syncope   . Chest pain     Normal cardiac cath 5/09  . Dumping syndrome   . Lumbar spondylosis   . Cervical spondylosis with myelopathy   . Hypertension   . Fibromyalgia   . PONV (postoperative nausea and vomiting)   . Restless leg syndrome   . Hypothyroidism   . Anemia   . Tremors of nervous system   . Arthritis   . Brain lesion     2 types  . Stroke     TIAs "mini strokes"- unsure of last TIA  . Lymphedema     seeing specialist for this    Past Surgical History  Procedure Laterality Date  . Rotator cuff repair Bilateral   . Total knee arthroplasty Left   . Appendectomy  1966  . Lumbar disc surgery      x2  . Total abdominal hysterectomy    . Total knee arthroplasty Right 11/18/2012    Procedure: TOTAL KNEE ARTHROPLASTY;  Surgeon: Yvette Rack., MD;  Location: [facility];  Service: Orthopedics;  Laterality: Right;  . Knee arthroscopy Bilateral   . Cholecystectomy  2015  . Hip bursa surgery Bilateral   . Lumbar fusion    . Coronary angiogram  2009    Normal coronaries  . Cholecystectomy  2015    There were no vitals filed for this visit.  Visit Diagnosis:   Lymphedema  Difficulty walking      Subjective Assessment - 03/27/15 1655    Subjective Pt states that her legs were down quite a bit yesterday but she took the short stretch bandages off last night and the swelling is back up today.    Currently in Pain? No/denies                         [facility] Adult PT Treatment/Exercise - 03/27/15 0001    Posture/Postural Control   Posture Comments 250.9   Manual Therapy   Manual Therapy Manual Lymphatic Drainage (MLD)   Manual therapy comments bandaging full bilateral LE's using multi layered short stretch bandaging, 1/2" foam following MLD   Manual Lymphatic Drainage (MLD) bilateral LE via inguinal axillary anastmosis anteriorly including supraclavicular, deep and superficial abdominal and inguinal axillary anastomisis followed by LE.                  PT Short Term Goals - 03/27/15 1702    PT SHORT TERM GOAL #1   Title Pt to be I in skin  care and care of bandages   Time 2   Period Weeks   PT SHORT TERM GOAL #2   Title Pt to verbalize precautions for decreasing risk of infection   Time 2   Period Weeks   Status Achieved   PT SHORT TERM GOAL #3   Title reduce volume by 30%   Time 4   Period Weeks   Status Achieved           PT Long Term Goals - 03/27/15 1704    PT LONG TERM GOAL #1   Title Reduce volume by 50%   Time 8   Period Weeks   Status On-going   PT LONG TERM GOAL #2   Title Pt to verbalize understanding of the maintenance phase of tx   Time 8   Period Weeks   Status On-going   PT LONG TERM GOAL #3   Title Pt to understand where and how often to be fitted for a compression garment   Time 8   Period Weeks   Status On-going   PT LONG TERM GOAL #4   Title Pt to be able to lift LE onto the treatment table to allow ease of getting in and our of bed   Time 8   Period Weeks   Status On-going   PT LONG TERM GOAL #5   Title Pt to state she is able to walk for 10 minutes without difficulty ( pt  limited to 10 minutes due to back issues)   Time 8   Period Weeks   Status On-going               Plan - 03/27/15 1700    Clinical Impression Statement Pt was going to be remeasured today but pt ran 15 mintues late due to ride being late therefore pt will be remeasured tomorrow.  Pt weigh 250.0 was 250.9 on 03/25/2015.     Pt will benefit from skilled therapeutic intervention in order to improve on the following deficits Increased edema   Rehab Potential Good   PT Frequency 3x / week   PT Duration 12 weeks   PT Treatment/Interventions Manual techniques;Manual lymph drainage;Compression bandaging   PT Next Visit Plan remeasure volume next visit    Consulted and Agree with Plan of Care Patient        Problem List Patient Active Problem List   Diagnosis Date Noted  . Normal coronary arteries 2009 01/14/2015  . Obesity-BMI 40 01/14/2015  . Restless leg 01/14/2015  . Chest pain 01/14/2015  . Back pain 01/14/2015  . Renal insufficiency 01/14/2015  . Dementia- mild memory issues 01/14/2015  . Occult blood positive stool 12/14/2014  . Anemia 12/14/2014  . Family history of colon cancer 12/14/2014  . Change in bowel habits 12/14/2014  . GERD (gastroesophageal reflux disease) 12/14/2014  . Dyspnea 05/17/2012  . Lymphedema 05/17/2012  . Weight gain 05/17/2012  . HTN (hypertension) 05/17/2012    Rayetta Humphrey, PT CLT [phone] 03/27/2015, 5:05 PM  [facility] [address] [place], [place], [address] Phone: [phone]   Fax:  [phone]

## 2015-03-29 ENCOUNTER — Ambulatory Visit (HOSPITAL_COMMUNITY): Payer: Medicare Other | Admitting: Physical Therapy

## 2015-03-29 DIAGNOSIS — I890 Lymphedema, not elsewhere classified: Secondary | ICD-10-CM

## 2015-03-29 DIAGNOSIS — R262 Difficulty in walking, not elsewhere classified: Secondary | ICD-10-CM

## 2015-03-29 NOTE — Therapy (Signed)
[name], [place], [address] Phone: [phone]   Fax:  [phone]  Physical Therapy Treatment  Patient Details  Name: [name] MRN: [id number] Date of Birth: 1945/09/13 Referring Provider:  Dione Housekeeper, MD  Encounter Date: 03/29/2015      PT End of Session - 03/29/15 1619    Visit Number 15   Number of Visits 26   Date for PT Re-Evaluation 04/15/15   Authorization Type medicare   Authorization - Visit Number 15   Authorization - Number of Visits 20   PT Start Time 1020   PT Stop Time 1150   PT Time Calculation (min) 90 min   Activity Tolerance Patient tolerated treatment well      Past Medical History  Diagnosis Date  . Rotator cuff injury   . Fatigue   . Edema   . Hyperlipidemia   . Depression   . Anxiety   . Syncope   . Chest pain     Normal cardiac cath 5/09  . Dumping syndrome   . Lumbar spondylosis   . Cervical spondylosis with myelopathy   . Hypertension   . Fibromyalgia   . PONV (postoperative nausea and vomiting)   . Restless leg syndrome   . Hypothyroidism   . Anemia   . Tremors of nervous system   . Arthritis   . Brain lesion     2 types  . Stroke     TIAs "mini strokes"- unsure of last TIA  . Lymphedema     seeing specialist for this    Past Surgical History  Procedure Laterality Date  . Rotator cuff repair Bilateral   . Total knee arthroplasty Left   . Appendectomy  1966  . Lumbar disc surgery      x2  . Total abdominal hysterectomy    . Total knee arthroplasty Right 11/18/2012    Procedure: TOTAL KNEE ARTHROPLASTY;  Surgeon: Yvette Rack., MD;  Location: [facility];  Service: Orthopedics;  Laterality: Right;  . Knee arthroscopy Bilateral   . Cholecystectomy  2015  . Hip bursa surgery Bilateral   . Lumbar fusion    . Coronary angiogram  2009    Normal coronaries  . Cholecystectomy  2015    There were no vitals filed for this visit.  Visit Diagnosis:   Lymphedema  Difficulty walking      Subjective Assessment - 03/29/15 1617    Subjective Pt states that she went to the MD after her visit and the rain was coming down so hard that it soaked the bandages on her feet therefore she had to take them off.  States that the MD stated that her iron is critically low and has been placed on three iron pills a day.    Pertinent History +lymphedema; back surgery; B TKR; shoulder replacement.   Currently in Pain? No/denies         Date Date 02/14/2015  02/20/2015  03/08/2015 03/08/2015  03/29/2015 03/29/2015    Rt Lt Rt LT Rt LT  Rt LT  MTP MTP 23.00 23.00 23 23 22.8 22.8  22.6 23.1  ankle ankle 34.2 38.3 33.60 34.40 34.20 36.20  34.50 35.10  4cm 4cm 42.50 37.30 33.00 33.00 33.00 35.70  31.80 33.50  8cm 8cm 45.50 43.10 41.00 40.50 38.50 36.00  37.50 37.30  12 cm 12 cm 47.80 46.60 44.30 44.40 42.20 41.00  39.50 40.80  16cm 16cm 50.20 48.70 47.00 46.50  45.00 44.00  43.30 44.00  20cm 20cm 55.80 50.80 49.00 48.00 48.10 47.00  46.40 46.00  24cm 24cm 57.00 52.00 51.20 49.50 50.80 49.70  49.00 50.10  28cm 28cm 56.00 56.60 53.40 52.50 54.00 52.20  52.40 54.70  32cm 32cm 53.20 58.30 55.40 58.50 56.40 57.80  55.20 58.10  36cm 36cm 60.20 56.50 54.50 58.30 55.20 58.80  55.50 57.20  40cm 40cm 63.20 56.80 52.00 54.00 54.30 58.10  52.40 56.50  44cm 44cm 63.00 63.20 55.00 58.00 59.40 58.00  58.80 60.80  48cm 48cm 64.30 64.00 58.00 62.00 63.00 63.30  61.00 63.60                                                              Sum of squares Sum of squares 38430.67 36183.86 31605.86 33030.86 32550.67 32966.48  30904.85 33079.20  Total Volume Total Volume 12232.87 11517.68 10060.46 10514.05 10361.204 10493.56  9837.323 10529.44                     [facility] Adult PT Treatment/Exercise - 03/29/15 0001    Posture/Postural Control   Posture  Comments 250.9   Manual Therapy   Manual Therapy Manual Lymphatic Drainage (MLD)   Manual therapy comments bandaging full bilateral LE's using multi layered short stretch bandaging, 1/2" foam following MLD   Manual Lymphatic Drainage (MLD) bilateral LE via inguinal axillary anastmosis anteriorly including supraclavicular, deep and superficial abdominal and inguinal axillary anastomisis followed by LE.                  PT Short Term Goals - 03/27/15 1702    PT SHORT TERM GOAL #1   Title Pt to be I in skin care and care of bandages   Time 2   Period Weeks   PT SHORT TERM GOAL #2   Title Pt to verbalize precautions for decreasing risk of infection   Time 2   Period Weeks   Status Achieved   PT SHORT TERM GOAL #3   Title reduce volume by 30%   Time 4   Period Weeks   Status Achieved           PT Long Term Goals - 03/27/15 1704    PT LONG TERM GOAL #1   Title Reduce volume by 50%   Time 8   Period Weeks   Status On-going   PT LONG TERM GOAL #2   Title Pt to verbalize understanding of the maintenance phase of tx   Time 8   Period Weeks   Status On-going   PT LONG TERM GOAL #3   Title Pt to understand where and how often to be fitted for a compression garment   Time 8   Period Weeks   Status On-going   PT LONG TERM GOAL #4   Title Pt to be able to lift LE onto the treatment table to allow ease of getting in and our of bed   Time 8   Period Weeks   Status On-going   PT LONG TERM GOAL #5   Title Pt to state she is able to walk for 10 minutes without difficulty ( pt limited to 10 minutes due to back issues)   Time 8   Period Weeks   Status On-going  Plan - 03/29/15 1621    Clinical Impression Statement Pt appears to have stabilized in volume of fluid that she is losing.  Therapist will send prescription for compression stockings to MD and then forward to [facility] for measurement.  Due to abnormal shaped LE pt will need custom made  garments.  Pt weight down to 248.2    Pt will benefit from skilled therapeutic intervention in order to improve on the following deficits Increased edema   PT Frequency 3x / week   PT Duration 12 weeks   PT Treatment/Interventions Manual techniques;Manual lymph drainage;Compression bandaging   PT Next Visit Plan continue manual techniques until pt obtains her garment.          Problem List Patient Active Problem List   Diagnosis Date Noted  . Normal coronary arteries 2009 01/14/2015  . Obesity-BMI 40 01/14/2015  . Restless leg 01/14/2015  . Chest pain 01/14/2015  . Back pain 01/14/2015  . Renal insufficiency 01/14/2015  . Dementia- mild memory issues 01/14/2015  . Occult blood positive stool 12/14/2014  . Anemia 12/14/2014  . Family history of colon cancer 12/14/2014  . Change in bowel habits 12/14/2014  . GERD (gastroesophageal reflux disease) 12/14/2014  . Dyspnea 05/17/2012  . Lymphedema 05/17/2012  . Weight gain 05/17/2012  . HTN (hypertension) 05/17/2012    Rayetta Humphrey, PT CLT [phone] 03/29/2015, 4:24 PM  [facility] [address] [place], [place], [address] Phone: [phone]   Fax:  [phone]

## 2015-04-01 ENCOUNTER — Ambulatory Visit (HOSPITAL_COMMUNITY): Payer: Medicare Other | Admitting: Physical Therapy

## 2015-04-01 DIAGNOSIS — I890 Lymphedema, not elsewhere classified: Secondary | ICD-10-CM

## 2015-04-01 DIAGNOSIS — R262 Difficulty in walking, not elsewhere classified: Secondary | ICD-10-CM

## 2015-04-01 NOTE — Therapy (Signed)
[place], [place], [address] Phone: [phone]   Fax:  [phone]  Physical Therapy Treatment  Patient Details  Name: [name] MRN: [id number] Date of Birth: [birth year] Referring Provider:  Dione Housekeeper, MD  Encounter Date: 04/01/2015      PT End of Session - 04/01/15 1210    Visit Number 16   Number of Visits 26   Date for PT Re-Evaluation 04/15/15   Authorization Type medicare   Authorization - Visit Number 16   Authorization - Number of Visits 20   PT Start Time 6759   PT Stop Time 1159   PT Time Calculation (min) 94 min   Activity Tolerance Patient tolerated treatment well      Past Medical History  Diagnosis Date  . Rotator cuff injury   . Fatigue   . Edema   . Hyperlipidemia   . Depression   . Anxiety   . Syncope   . Chest pain     Normal cardiac cath 5/09  . Dumping syndrome   . Lumbar spondylosis   . Cervical spondylosis with myelopathy   . Hypertension   . Fibromyalgia   . PONV (postoperative nausea and vomiting)   . Restless leg syndrome   . Hypothyroidism   . Anemia   . Tremors of nervous system   . Arthritis   . Brain lesion     2 types  . Stroke     TIAs "mini strokes"- unsure of last TIA  . Lymphedema     seeing specialist for this    Past Surgical History  Procedure Laterality Date  . Rotator cuff repair Bilateral   . Total knee arthroplasty Left   . Appendectomy  1966  . Lumbar disc surgery      x2  . Total abdominal hysterectomy    . Total knee arthroplasty Right 11/18/2012    Procedure: TOTAL KNEE ARTHROPLASTY;  Surgeon: Yvette Rack., MD;  Location: [facility];  Service: Orthopedics;  Laterality: Right;  . Knee arthroscopy Bilateral   . Cholecystectomy  2015  . Hip bursa surgery Bilateral   . Lumbar fusion    . Coronary angiogram  2009    Normal coronaries  . Cholecystectomy  2015    There were no vitals filed for this visit.  Visit Diagnosis:   Lymphedema  Difficulty walking      Subjective Assessment - 04/01/15 1208    Subjective Pt states that her legs were bothering her all weekend.  States she took the wraps off an noticed that B feet were red    Currently in Pain? Yes   Pain Score 5    Pain Location Back   Pain Orientation Lower   Pain Descriptors / Indicators Aching;Constant   Pain Type Chronic pain               [facility] Adult PT Treatment/Exercise - 04/01/15 0001    Posture/Postural Control   Posture Comments 250.9   Manual Therapy   Manual Therapy Manual Lymphatic Drainage (MLD)   Manual therapy comments bandaging full bilateral LE's using multi layered short stretch bandaging, 1/2" foam following MLD   Manual Lymphatic Drainage (MLD) bilateral LE via inguinal axillary anastmosis anteriorly including supraclavicular, deep and superficial abdominal and inguinal axillary anastomisis followed by LE.                PT Education - 04/01/15 1209    Education provided Yes  Education Details Explained to take wraps off at anytime if she has increased pain with them she should not have pain with the wraps.    Person(s) Educated Patient   Methods Explanation   Comprehension Verbalized understanding          PT Short Term Goals - 03/27/15 1702    PT SHORT TERM GOAL #1   Title Pt to be I in skin care and care of bandages   Time 2   Period Weeks   PT SHORT TERM GOAL #2   Title Pt to verbalize precautions for decreasing risk of infection   Time 2   Period Weeks   Status Achieved   PT SHORT TERM GOAL #3   Title reduce volume by 30%   Time 4   Period Weeks   Status Achieved           PT Long Term Goals - 03/27/15 1704    PT LONG TERM GOAL #1   Title Reduce volume by 50%   Time 8   Period Weeks   Status On-going   PT LONG TERM GOAL #2   Title Pt to verbalize understanding of the maintenance phase of tx   Time 8   Period Weeks   Status On-going   PT LONG TERM GOAL #3   Title Pt to  understand where and how often to be fitted for a compression garment   Time 8   Period Weeks   Status On-going   PT LONG TERM GOAL #4   Title Pt to be able to lift LE onto the treatment table to allow ease of getting in and our of bed   Time 8   Period Weeks   Status On-going   PT LONG TERM GOAL #5   Title Pt to state she is able to walk for 10 minutes without difficulty ( pt limited to 10 minutes due to back issues)   Time 8   Period Weeks   Status On-going               Plan - 04/01/15 1210    Clinical Impression Statement Prescription sent to MD for compression garments but not recieved at this point.  Therapist stressed the importance of walking with compression wraps on.  Pt states her back bothers her too much when she walks so she stays in bed or her recliner chair. Therapist explained that the bandages loosen as she walks.   PT Next Visit Plan continue manual techniques until pt obtains her garment.          Problem List Patient Active Problem List   Diagnosis Date Noted  . Normal coronary arteries 2009 01/14/2015  . Obesity-BMI 40 01/14/2015  . Restless leg 01/14/2015  . Chest pain 01/14/2015  . Back pain 01/14/2015  . Renal insufficiency 01/14/2015  . Dementia- mild memory issues 01/14/2015  . Occult blood positive stool 12/14/2014  . Anemia 12/14/2014  . Family history of colon cancer 12/14/2014  . Change in bowel habits 12/14/2014  . GERD (gastroesophageal reflux disease) 12/14/2014  . Dyspnea 05/17/2012  . Lymphedema 05/17/2012  . Weight gain 05/17/2012  . HTN (hypertension) 05/17/2012   Rayetta Humphrey, PT CLT [phone] 04/01/2015, 12:13 PM  [facility] [address] [place], [place], [address] Phone: [phone]   Fax:  [phone]

## 2015-04-03 ENCOUNTER — Ambulatory Visit (HOSPITAL_COMMUNITY): Payer: Medicare Other | Admitting: Physical Therapy

## 2015-04-03 DIAGNOSIS — R262 Difficulty in walking, not elsewhere classified: Secondary | ICD-10-CM

## 2015-04-03 DIAGNOSIS — I890 Lymphedema, not elsewhere classified: Secondary | ICD-10-CM

## 2015-04-03 NOTE — Therapy (Addendum)
[place], [place], [address] Phone: [phone]   Fax:  [phone]  Physical Therapy Treatment  Patient Details  Name: [name] MRN: [id number] Date of Birth: April 21, 1946 Referring Provider:  Dione Housekeeper, MD  Encounter Date: 04/03/2015      PT End of Session - 04/03/15 1212    Visit Number 17   Number of Visits 26   Date for PT Re-Evaluation 04/15/15   Authorization Type medicare   Authorization - Visit Number 17   Authorization - Number of Visits 20   PT Start Time 6160   PT Stop Time 1150   PT Time Calculation (min) 95 min   Activity Tolerance Patient tolerated treatment well      Past Medical History  Diagnosis Date  . Rotator cuff injury   . Fatigue   . Edema   . Hyperlipidemia   . Depression   . Anxiety   . Syncope   . Chest pain     Normal cardiac cath 5/09  . Dumping syndrome   . Lumbar spondylosis   . Cervical spondylosis with myelopathy   . Hypertension   . Fibromyalgia   . PONV (postoperative nausea and vomiting)   . Restless leg syndrome   . Hypothyroidism   . Anemia   . Tremors of nervous system   . Arthritis   . Brain lesion     2 types  . Stroke     TIAs "mini strokes"- unsure of last TIA  . Lymphedema     seeing specialist for this    Past Surgical History  Procedure Laterality Date  . Rotator cuff repair Bilateral   . Total knee arthroplasty Left   . Appendectomy  1966  . Lumbar disc surgery      x2  . Total abdominal hysterectomy    . Total knee arthroplasty Right 11/18/2012    Procedure: TOTAL KNEE ARTHROPLASTY;  Surgeon: Yvette Rack., MD;  Location: [facility];  Service: Orthopedics;  Laterality: Right;  . Knee arthroscopy Bilateral   . Cholecystectomy  2015  . Hip bursa surgery Bilateral   . Lumbar fusion    . Coronary angiogram  2009    Normal coronaries  . Cholecystectomy  2015    There were no vitals filed for this visit.  Visit Diagnosis:   Lymphedema  Difficulty walking      Subjective Assessment - 04/03/15 1242    Subjective PT states her legs feel good today.  States she washed her bandages yesterday.     Currently in Pain? No/denies            [facility] PT Assessment - 04/03/15 1049    Posture/Postural Control   Posture Comments weight 248                     [facility] Adult PT Treatment/Exercise - 04/03/15 1049    Manual Therapy   Manual Therapy Manual Lymphatic Drainage (MLD)   Manual therapy comments bandaging full bilateral LE's using multi layered short stretch bandaging, 1/2" foam following MLD   Manual Lymphatic Drainage (MLD) bilateral LE via inguinal axillary anastmosis anteriorly including supraclavicular, deep and superficial abdominal and inguinal axillary anastomisis followed by LE.         04/03/2015   Rt Lt  22.6 22.5  32.00 33.80  32.00 32.00  38.00 38.00  40.50 40.50  43.50 43.00  47.00 45.00  49.20 47.50  52.20  53.50  54.50 55.50  49.00 52.00  51.50 55.50  57.00 61.00  60.00 62.00                 29762.24 31178.94  9473.619 9924.568              PT Short Term Goals - 03/27/15 1702    PT SHORT TERM GOAL #1   Title Pt to be I in skin care and care of bandages   Time 2   Period Weeks   PT SHORT TERM GOAL #2   Title Pt to verbalize precautions for decreasing risk of infection   Time 2   Period Weeks   Status Achieved   PT SHORT TERM GOAL #3   Title reduce volume by 30%   Time 4   Period Weeks   Status Achieved           PT Long Term Goals - 03/27/15 1704    PT LONG TERM GOAL #1   Title Reduce volume by 50%   Time 8   Period Weeks   Status On-going   PT LONG TERM GOAL #2   Title Pt to verbalize understanding of the maintenance phase of tx   Time 8   Period Weeks   Status On-going   PT LONG TERM GOAL #3   Title Pt to understand where and how often to be fitted for a compression garment   Time 8   Period Weeks   Status On-going   PT  LONG TERM GOAL #4   Title Pt to be able to lift LE onto the treatment table to allow ease of getting in and our of bed   Time 8   Period Weeks   Status On-going   PT LONG TERM GOAL #5   Title Pt to state she is able to walk for 10 minutes without difficulty ( pt limited to 10 minutes due to back issues)   Time 8   Period Weeks   Status On-going               Plan - 04/03/15 1213    Clinical Impression Statement prescription received and faxed to [facility].  bilateral LE's remeasured today with further reduction of 363.7cc from Rt and 604.87cc from [facility] as compared to measurements taken 5 days ago (8/5).  Pt also weighed at 248# today, (was 252# on 7/29)..  Pt continues to respond favorably to treatment.    PT Next Visit Plan continue manual techniques until pt obtains her garment.  follow up to ensure contact made from [facility]        Problem List Patient Active Problem List   Diagnosis Date Noted  . Normal coronary arteries 2009 01/14/2015  . Obesity-BMI 40 01/14/2015  . Restless leg 01/14/2015  . Chest pain 01/14/2015  . Back pain 01/14/2015  . Renal insufficiency 01/14/2015  . Dementia- mild memory issues 01/14/2015  . Occult blood positive stool 12/14/2014  . Anemia 12/14/2014  . Family history of colon cancer 12/14/2014  . Change in bowel habits 12/14/2014  . GERD (gastroesophageal reflux disease) 12/14/2014  . Dyspnea 05/17/2012  . Lymphedema 05/17/2012  . Weight gain 05/17/2012  . HTN (hypertension) 05/17/2012    [name], PTA/CLT [phone]  04/03/2015, 2:38 PM  [facility] [address] [place], [place], [address] Phone: [phone]   Fax:  [phone]

## 2015-04-05 ENCOUNTER — Ambulatory Visit (HOSPITAL_COMMUNITY): Payer: Medicare Other | Admitting: Physical Therapy

## 2015-04-05 DIAGNOSIS — I890 Lymphedema, not elsewhere classified: Secondary | ICD-10-CM | POA: Diagnosis not present

## 2015-04-05 DIAGNOSIS — R262 Difficulty in walking, not elsewhere classified: Secondary | ICD-10-CM

## 2015-04-05 NOTE — Therapy (Signed)
[place], [place], [address] Phone: [phone]   Fax:  [phone]  Physical Therapy Treatment  Patient Details  Name: [name] MRN: [id number] Date of Birth: Jun 22, 1946 Referring Provider:  Dione Housekeeper, MD  Encounter Date: 04/05/2015      PT End of Session - 04/05/15 1131    Visit Number 18   Number of Visits 26   Date for PT Re-Evaluation 04/15/15   Authorization Type medicare   Authorization - Visit Number 18   Authorization - Number of Visits 20   PT Start Time 7672   PT Stop Time 1130   PT Time Calculation (min) 75 min      Past Medical History  Diagnosis Date  . Rotator cuff injury   . Fatigue   . Edema   . Hyperlipidemia   . Depression   . Anxiety   . Syncope   . Chest pain     Normal cardiac cath 5/09  . Dumping syndrome   . Lumbar spondylosis   . Cervical spondylosis with myelopathy   . Hypertension   . Fibromyalgia   . PONV (postoperative nausea and vomiting)   . Restless leg syndrome   . Hypothyroidism   . Anemia   . Tremors of nervous system   . Arthritis   . Brain lesion     2 types  . Stroke     TIAs "mini strokes"- unsure of last TIA  . Lymphedema     seeing specialist for this    Past Surgical History  Procedure Laterality Date  . Rotator cuff repair Bilateral   . Total knee arthroplasty Left   . Appendectomy  1966  . Lumbar disc surgery      x2  . Total abdominal hysterectomy    . Total knee arthroplasty Right 11/18/2012    Procedure: TOTAL KNEE ARTHROPLASTY;  Surgeon: Yvette Rack., MD;  Location: [facility];  Service: Orthopedics;  Laterality: Right;  . Knee arthroscopy Bilateral   . Cholecystectomy  2015  . Hip bursa surgery Bilateral   . Lumbar fusion    . Coronary angiogram  2009    Normal coronaries  . Cholecystectomy  2015    There were no vitals filed for this visit.  Visit Diagnosis:  Lymphedema  Difficulty walking      Subjective  Assessment - 04/05/15 1129    Subjective Pt states she has not been contacted from [facility] yet.    Pertinent History +lymphedema; back surgery; B TKR; shoulder replacement.   Currently in Pain? No/denies               [facility] Adult PT Treatment/Exercise - 04/05/15 1130    Posture/Postural Control   Posture Comments weight 248   Manual Therapy   Manual Therapy Manual Lymphatic Drainage (MLD)   Manual therapy comments bandaging full bilateral LE's using multi layered short stretch bandaging, 1/2" foam following MLD   Manual Lymphatic Drainage (MLD) bilateral LE via inguinal axillary anastmosis anteriorly including supraclavicular, deep and superficial abdominal and inguinal axillary anastomisis followed by LE.                  PT Short Term Goals - 03/27/15 1702    PT SHORT TERM GOAL #1   Title Pt to be I in skin care and care of bandages   Time 2   Period Weeks   PT SHORT TERM GOAL #2   Title  Pt to verbalize precautions for decreasing risk of infection   Time 2   Period Weeks   Status Achieved   PT SHORT TERM GOAL #3   Title reduce volume by 30%   Time 4   Period Weeks   Status Achieved           PT Long Term Goals - 03/27/15 1704    PT LONG TERM GOAL #1   Title Reduce volume by 50%   Time 8   Period Weeks   Status On-going   PT LONG TERM GOAL #2   Title Pt to verbalize understanding of the maintenance phase of tx   Time 8   Period Weeks   Status On-going   PT LONG TERM GOAL #3   Title Pt to understand where and how often to be fitted for a compression garment   Time 8   Period Weeks   Status On-going   PT LONG TERM GOAL #4   Title Pt to be able to lift LE onto the treatment table to allow ease of getting in and our of bed   Time 8   Period Weeks   Status On-going   PT LONG TERM GOAL #5   Title Pt to state she is able to walk for 10 minutes without difficulty ( pt limited to 10 minutes due to back issues)   Time 8   Period Weeks    Status On-going               Plan - 04/05/15 1132    Clinical Impression Statement Pt anxious to get measured so she will have her compression garments; has not heard from [facility] yet concerning when they will be able to come out and fit her.  Pt continues to show improvement in decongestion.  No induration noted in LE at this time.    PT Next Visit Plan continue manual techniques until pt obtains her garment.  follow up to ensure contact made from [facility] called company on Friday 8/13 Hoyle Sauer is out of office until 04/09/2015.        Problem List Patient Active Problem List   Diagnosis Date Noted  . Normal coronary arteries 2009 01/14/2015  . Obesity-BMI 40 01/14/2015  . Restless leg 01/14/2015  . Chest pain 01/14/2015  . Back pain 01/14/2015  . Renal insufficiency 01/14/2015  . Dementia- mild memory issues 01/14/2015  . Occult blood positive stool 12/14/2014  . Anemia 12/14/2014  . Family history of colon cancer 12/14/2014  . Change in bowel habits 12/14/2014  . GERD (gastroesophageal reflux disease) 12/14/2014  . Dyspnea 05/17/2012  . Lymphedema 05/17/2012  . Weight gain 05/17/2012  . HTN (hypertension) 05/17/2012    Rayetta Humphrey, PT CLT [phone] 04/05/2015, 11:40 AM  [facility] [address] [place], [place], [address] Phone: [phone]   Fax:  [phone]

## 2015-04-08 ENCOUNTER — Ambulatory Visit (HOSPITAL_COMMUNITY): Payer: Medicare Other | Admitting: Physical Therapy

## 2015-04-08 ENCOUNTER — Other Ambulatory Visit (HOSPITAL_COMMUNITY): Payer: Self-pay | Admitting: Nephrology

## 2015-04-08 DIAGNOSIS — I890 Lymphedema, not elsewhere classified: Secondary | ICD-10-CM | POA: Diagnosis not present

## 2015-04-08 DIAGNOSIS — R262 Difficulty in walking, not elsewhere classified: Secondary | ICD-10-CM

## 2015-04-08 DIAGNOSIS — N183 Chronic kidney disease, stage 3 unspecified: Secondary | ICD-10-CM

## 2015-04-08 NOTE — Therapy (Signed)
[place], [place], [address] Phone: [phone]   Fax:  [phone]  Physical Therapy Treatment  Patient Details  Name: [name] MRN: [id number] Date of Birth: Feb 04, 1946 Referring Provider:  Dione Housekeeper, MD  Encounter Date: 04/08/2015      PT End of Session - 04/08/15 1530    Visit Number 19   Number of Visits 26   Date for PT Re-Evaluation 04/15/15   Authorization Type medicare   Authorization - Visit Number 19   Authorization - Number of Visits 20   PT Start Time 1020   PT Stop Time 1145   PT Time Calculation (min) 85 min   Activity Tolerance Patient tolerated treatment well   Behavior During Therapy [facility] for tasks assessed/performed      Past Medical History  Diagnosis Date  . Rotator cuff injury   . Fatigue   . Edema   . Hyperlipidemia   . Depression   . Anxiety   . Syncope   . Chest pain     Normal cardiac cath 5/09  . Dumping syndrome   . Lumbar spondylosis   . Cervical spondylosis with myelopathy   . Hypertension   . Fibromyalgia   . PONV (postoperative nausea and vomiting)   . Restless leg syndrome   . Hypothyroidism   . Anemia   . Tremors of nervous system   . Arthritis   . Brain lesion     2 types  . Stroke     TIAs "mini strokes"- unsure of last TIA  . Lymphedema     seeing specialist for this    Past Surgical History  Procedure Laterality Date  . Rotator cuff repair Bilateral   . Total knee arthroplasty Left   . Appendectomy  1966  . Lumbar disc surgery      x2  . Total abdominal hysterectomy    . Total knee arthroplasty Right 11/18/2012    Procedure: TOTAL KNEE ARTHROPLASTY;  Surgeon: Yvette Rack., MD;  Location: [place];  Service: Orthopedics;  Laterality: Right;  . Knee arthroscopy Bilateral   . Cholecystectomy  2015  . Hip bursa surgery Bilateral   . Lumbar fusion    . Coronary angiogram  2009    Normal coronaries  . Cholecystectomy  2015     There were no vitals filed for this visit.  Visit Diagnosis:  Lymphedema  Difficulty walking      Subjective Assessment - 04/08/15 1527    Subjective PT states she is doing well today.  Pt requests [place] contact her daughter Vicente Males) and Hoyle Sauer sent a message regarding this.   Currently in Pain? No/denies                         [facility] Adult PT Treatment/Exercise - 04/08/15 0001    Posture/Postural Control   Posture Comments weight 244 Lbs   Manual Therapy   Manual Therapy Manual Lymphatic Drainage (MLD)   Manual therapy comments bandaging full bilateral LE's using multi layered short stretch bandaging, 1/2" foam following MLD   Manual Lymphatic Drainage (MLD) For bilateral LE's routing lymph fluid via bilateral inguino-axillary anastomosis anteriorly.                   PT Short Term Goals - 03/27/15 1702    PT SHORT TERM GOAL #1   Title Pt to be I in skin care and care  of bandages   Time 2   Period Weeks   PT SHORT TERM GOAL #2   Title Pt to verbalize precautions for decreasing risk of infection   Time 2   Period Weeks   Status Achieved   PT SHORT TERM GOAL #3   Title reduce volume by 30%   Time 4   Period Weeks   Status Achieved           PT Long Term Goals - 03/27/15 1704    PT LONG TERM GOAL #1   Title Reduce volume by 50%   Time 8   Period Weeks   Status On-going   PT LONG TERM GOAL #2   Title Pt to verbalize understanding of the maintenance phase of tx   Time 8   Period Weeks   Status On-going   PT LONG TERM GOAL #3   Title Pt to understand where and how often to be fitted for a compression garment   Time 8   Period Weeks   Status On-going   PT LONG TERM GOAL #4   Title Pt to be able to lift LE onto the treatment table to allow ease of getting in and our of bed   Time 8   Period Weeks   Status On-going   PT LONG TERM GOAL #5   Title Pt to state she is able to walk for 10 minutes without difficulty ( pt  limited to 10 minutes due to back issues)   Time 8   Period Weeks   Status On-going               Plan - 04/08/15 1531    Clinical Impression Statement Contacted Hoyle Sauer who has paperwork and will get in touch with patients daugthter this week. Continued with MLD and bandaging to bilateral LE's.  Pt with addition 4# weight loss from last weeks measurements.     PT Next Visit Plan continue manual techniques until pt obtains her garment.  follow up to ensure contact made from [place] by end of this week. Gcodes next session.        Problem List Patient Active Problem List   Diagnosis Date Noted  . Normal coronary arteries 2009 01/14/2015  . Obesity-BMI 40 01/14/2015  . Restless leg 01/14/2015  . Chest pain 01/14/2015  . Back pain 01/14/2015  . Renal insufficiency 01/14/2015  . Dementia- mild memory issues 01/14/2015  . Occult blood positive stool 12/14/2014  . Anemia 12/14/2014  . Family history of colon cancer 12/14/2014  . Change in bowel habits 12/14/2014  . GERD (gastroesophageal reflux disease) 12/14/2014  . Dyspnea 05/17/2012  . Lymphedema 05/17/2012  . Weight gain 05/17/2012  . HTN (hypertension) 05/17/2012    [name], PTA/CLT [phone]  04/08/2015, 3:35 PM  [place] [address] [place], [place], [address] Phone: [phone]   Fax:  [phone]

## 2015-04-10 ENCOUNTER — Ambulatory Visit (HOSPITAL_COMMUNITY): Payer: Medicare Other | Admitting: Physical Therapy

## 2015-04-10 DIAGNOSIS — I890 Lymphedema, not elsewhere classified: Secondary | ICD-10-CM

## 2015-04-10 DIAGNOSIS — R262 Difficulty in walking, not elsewhere classified: Secondary | ICD-10-CM

## 2015-04-10 NOTE — Therapy (Signed)
[place] [address] [place], [place], [address] Phone: [phone]   Fax:  [phone]  Physical Therapy Treatment (re-evaluation and gcode update)  Patient Details  Name: DRAKE LANDING MRN: [id number] Date of Birth: 03/08/1946 Referring Provider:  Dione Housekeeper, MD  Encounter Date: 04/10/2015      PT End of Session - 04/10/15 1209    Visit Number 20   Number of Visits 26   Date for PT Re-Evaluation 04/15/15   Authorization Type medicare   Authorization - Visit Number 20   Authorization - Number of Visits 30   PT Start Time 1030   PT Stop Time 1150   PT Time Calculation (min) 80 min   Activity Tolerance Patient tolerated treatment well   Behavior During Therapy [facility] for tasks assessed/performed      Past Medical History  Diagnosis Date  . Rotator cuff injury   . Fatigue   . Edema   . Hyperlipidemia   . Depression   . Anxiety   . Syncope   . Chest pain     Normal cardiac cath 5/09  . Dumping syndrome   . Lumbar spondylosis   . Cervical spondylosis with myelopathy   . Hypertension   . Fibromyalgia   . PONV (postoperative nausea and vomiting)   . Restless leg syndrome   . Hypothyroidism   . Anemia   . Tremors of nervous system   . Arthritis   . Brain lesion     2 types  . Stroke     TIAs "mini strokes"- unsure of last TIA  . Lymphedema     seeing specialist for this    Past Surgical History  Procedure Laterality Date  . Rotator cuff repair Bilateral   . Total knee arthroplasty Left   . Appendectomy  1966  . Lumbar disc surgery      x2  . Total abdominal hysterectomy    . Total knee arthroplasty Right 11/18/2012    Procedure: TOTAL KNEE ARTHROPLASTY;  Surgeon: Yvette Rack., MD;  Location: [place];  Service: Orthopedics;  Laterality: Right;  . Knee arthroscopy Bilateral   . Cholecystectomy  2015  . Hip bursa surgery Bilateral   . Lumbar fusion    . Coronary angiogram  2009    Normal coronaries  .  Cholecystectomy  2015    There were no vitals filed for this visit.  Visit Diagnosis:  Lymphedema  Difficulty walking      Subjective Assessment - 04/10/15 1203    Subjective Pt came early today to meet with [place] to get measured for compression garments. Pt still had on compression bandages upon arrival. No pain or discomfort reported   Currently in Pain? No/denies                         [facility] Adult PT Treatment/Exercise - 04/10/15 1206    Posture/Postural Control   Posture Comments weight 244 Lbs   Manual Therapy   Manual Therapy Manual Lymphatic Drainage (MLD)   Manual therapy comments bandaging full bilateral LE's using multi layered short stretch bandaging, 1/2" foam following MLD   Manual Lymphatic Drainage (MLD) For bilateral LE's routing lymph fluid via bilateral inguino-axillary anastomosis anteriorly.        04/03/2015  04/10/2015   Rt Lt Rt Lt   22.6 22.5 22.2 22.5  32.00 33.80 32.40 33.50  32.00 32.00 30.80 31.70  38.00 38.00 36.60  35.80  40.50 40.50 39.00 39.50  43.50 43.00 41.50 42.00  47.00 45.00 45.50 45.00  49.20 47.50 48.00 47.20  52.20 53.50 50.00 51.50  54.50 55.50 52.50 55.80  49.00 52.00 48.50 53.00  51.50 55.50 55.50 55.00  57.00 61.00 59.00 58.30  60.00 62.00 59.50 61.20                           29762.24 31178.94 29158.30 30236.34  9473.619 9924.568 9281.378 9624.529                PT Short Term Goals - 04/10/15 1207    PT SHORT TERM GOAL #1   Title Pt to be I in skin care and care of bandages   Time 2   Period Weeks   Status On-going   PT SHORT TERM GOAL #2   Title Pt to verbalize precautions for decreasing risk of infection   Time 2   Period Weeks   Status Achieved   PT SHORT TERM GOAL #3   Title reduce volume by 30%   Time 4   Period Weeks   Status Achieved           PT Long Term Goals - 04/10/15 1207    PT LONG TERM GOAL #1   Title Reduce volume by 50%   Time 8   Period Weeks    Status Achieved   PT LONG TERM GOAL #2   Title Pt to verbalize understanding of the maintenance phase of tx   Time 8   Period Weeks   Status Achieved   PT LONG TERM GOAL #3   Title Pt to understand where and how often to be fitted for a compression garment   Time 8   Period Weeks   Status Achieved   PT LONG TERM GOAL #4   Title Pt to be able to lift LE onto the treatment table to allow ease of getting in and our of bed   Time 8   Period Weeks   Status Achieved   PT LONG TERM GOAL #5   Title Pt to state she is able to walk for 10 minutes without difficulty ( pt limited to 10 minutes due to back issues)   Time 8   Period Weeks   Status On-going               Plan - 04/10/15 1210    Clinical Impression Statement Re-evaluation completed today.  Pt Remeasured  with additional reduction of 192.24cc Rt and 300 cc Lt.  PT wtih additional 4# weight loss this past week.  PT is progressing well toward goals and expected to receive compression garments soon.  Capris and knee highs were ordered today.  Pt is aware of daily use of hose and to get new pairs every 6 months.   PT will benefit from continued therapy until compression garments received.     PT Next Visit Plan continue manual techniques until pt obtains her garment.         Problem List Patient Active Problem List   Diagnosis Date Noted  . Normal coronary arteries 2009 01/14/2015  . Obesity-BMI 40 01/14/2015  . Restless leg 01/14/2015  . Chest pain 01/14/2015  . Back pain 01/14/2015  . Renal insufficiency 01/14/2015  . Dementia- mild memory issues 01/14/2015  . Occult blood positive stool 12/14/2014  . Anemia 12/14/2014  . Family history of colon cancer 12/14/2014  . Change in bowel  habits 12/14/2014  . GERD (gastroesophageal reflux disease) 12/14/2014  . Dyspnea 05/17/2012  . Lymphedema 05/17/2012  . Weight gain 05/17/2012  . HTN (hypertension) 05/17/2012    [name], PTA/CLT [phone]  04/10/2015,  12:13 PM  [place] [address] [place], [place], [address] Phone: [phone]   Fax:  [phone]

## 2015-04-11 NOTE — Therapy (Addendum)
[place], [place], [address] Phone: [phone]   Fax:  [phone]  Physical Therapy Treatment  Patient Details  Name: NEIVA MAENZA MRN: [id number] Date of Birth: Jun 15, 1946 Referring Provider:  Dione Housekeeper, MD  Encounter Date: 04/10/2015      PT End of Session - 04/10/15 1209    Visit Number 20   Number of Visits 26   Date for PT Re-Evaluation 04/15/15   Authorization Type medicare   Authorization - Visit Number 20   Authorization - Number of Visits 30   PT Start Time 1030   PT Stop Time 1150   PT Time Calculation (min) 80 min   Activity Tolerance Patient tolerated treatment well   Behavior During Therapy [facility] for tasks assessed/performed      Past Medical History  Diagnosis Date  . Rotator cuff injury   . Fatigue   . Edema   . Hyperlipidemia   . Depression   . Anxiety   . Syncope   . Chest pain     Normal cardiac cath 5/09  . Dumping syndrome   . Lumbar spondylosis   . Cervical spondylosis with myelopathy   . Hypertension   . Fibromyalgia   . PONV (postoperative nausea and vomiting)   . Restless leg syndrome   . Hypothyroidism   . Anemia   . Tremors of nervous system   . Arthritis   . Brain lesion     2 types  . Stroke     TIAs "mini strokes"- unsure of last TIA  . Lymphedema     seeing specialist for this    Past Surgical History  Procedure Laterality Date  . Rotator cuff repair Bilateral   . Total knee arthroplasty Left   . Appendectomy  1966  . Lumbar disc surgery      x2  . Total abdominal hysterectomy    . Total knee arthroplasty Right 11/18/2012    Procedure: TOTAL KNEE ARTHROPLASTY;  Surgeon: Yvette Rack., MD;  Location: [facility];  Service: Orthopedics;  Laterality: Right;  . Knee arthroscopy Bilateral   . Cholecystectomy  2015  . Hip bursa surgery Bilateral   . Lumbar fusion    . Coronary angiogram  2009    Normal coronaries  . Cholecystectomy  2015     There were no vitals filed for this visit.  Visit Diagnosis:  Lymphedema  Difficulty walking      Subjective Assessment - 04/10/15 1203    Subjective Pt came early today to meet with [place] to get measured for compression garments. Pt still had on compression bandages upon arrival. No pain or discomfort reported   Currently in Pain? No/denies               [facility] Adult PT Treatment/Exercise - 04/10/15 1206    Posture/Postural Control   Posture Comments weight 244 Lbs   Manual Therapy   Manual Therapy Manual Lymphatic Drainage (MLD)   Manual therapy comments bandaging full bilateral LE's using multi layered short stretch bandaging, 1/2" foam following MLD   Manual Lymphatic Drainage (MLD) For bilateral LE's routing lymph fluid via bilateral inguino-axillary anastomosis anteriorly.               PT Short Term Goals - 04/10/15 1207    PT SHORT TERM GOAL #1   Title Pt to be I in skin care and care of bandages   Time 2   Period  Weeks   Status On-going   PT SHORT TERM GOAL #2   Title Pt to verbalize precautions for decreasing risk of infection   Time 2   Period Weeks   Status Achieved   PT SHORT TERM GOAL #3   Title reduce volume by 30%   Time 4   Period Weeks   Status Achieved           PT Long Term Goals - 04/10/15 1207    PT LONG TERM GOAL #1   Title Reduce volume by 50%   Time 8   Period Weeks   Status Achieved   PT LONG TERM GOAL #2   Title Pt to verbalize understanding of the maintenance phase of tx   Time 8   Period Weeks   Status Achieved   PT LONG TERM GOAL #3   Title Pt to understand where and how often to be fitted for a compression garment   Time 8   Period Weeks   Status Achieved   PT LONG TERM GOAL #4   Title Pt to be able to lift LE onto the treatment table to allow ease of getting in and our of bed   Time 8   Period Weeks   Status Achieved   PT LONG TERM GOAL #5   Title Pt to state she is able to walk for 10 minutes  without difficulty ( pt limited to 10 minutes due to back issues)   Time 8   Period Weeks   Status On-going               Plan - 04/10/15 1210    Clinical Impression Statement Re-evaluation completed today.  Pt Remeasured  with additional reduction of 192.24cc Rt and 300 cc Lt.  PT wtih additional 4# weight loss this past week.  PT is progressing well toward goals and expected to receive compression garments soon.  Capris and knee highs were ordered today.  Pt is aware of daily use of hose and to get new pairs every 6 months.   PT will benefit from continued therapy until compression garments received.     PT Next Visit Plan continue manual techniques until pt obtains her garment.           G-Codes - 04/16/2015 1652    Functional Assessment Tool Used volume   Functional Limitation Other PT primary   Other PT Primary Current Status (O1751) At least 20 percent but less than 40 percent impaired, limited or restricted   Other PT Primary Goal Status (W2585) At least 20 percent but less than 40 percent impaired, limited or restricted      Problem List Patient Active Problem List   Diagnosis Date Noted  . Normal coronary arteries 2009 01/14/2015  . Obesity-BMI 40 01/14/2015  . Restless leg 01/14/2015  . Chest pain 01/14/2015  . Back pain 01/14/2015  . Renal insufficiency 01/14/2015  . Dementia- mild memory issues 01/14/2015  . Occult blood positive stool 12/14/2014  . Anemia 12/14/2014  . Family history of colon cancer 12/14/2014  . Change in bowel habits 12/14/2014  . GERD (gastroesophageal reflux disease) 12/14/2014  . Dyspnea 05/17/2012  . Lymphedema 05/17/2012  . Weight gain 05/17/2012  . HTN (hypertension) 05/17/2012  ,Rayetta Humphrey, PT CLT [phone]  Physical Therapy Progress Note  Dates of Reporting Period: 02/14/2015  to 04/16/2015  Objective Reports of Subjective Statement: My legs have never looked so good  Objective Measurements: see above   Goal  Update: see above   Plan: pt measured for garments today will see until pt receives compression garments to avoid increased volume of fluid   Reason Skilled Services are Required: Pt will need to be seen until compression garments are available or fluid volume in LE will increase.    Rayetta Humphrey, PT CLT [phone] 04/11/2015, 4:53 PM  [place] [address] [place], [place], [address] Phone: [phone]   Fax:  [phone]

## 2015-04-12 ENCOUNTER — Ambulatory Visit (HOSPITAL_COMMUNITY): Payer: Medicare Other | Admitting: Physical Therapy

## 2015-04-12 DIAGNOSIS — R262 Difficulty in walking, not elsewhere classified: Secondary | ICD-10-CM

## 2015-04-12 DIAGNOSIS — I890 Lymphedema, not elsewhere classified: Secondary | ICD-10-CM

## 2015-04-12 NOTE — Therapy (Signed)
[place], [place], [address] Phone: [phone]   Fax:  [phone]  Physical Therapy Treatment  Patient Details  Name: [name] MRN: [id number] Date of Birth: 1945-10-24 Referring Provider:  Dione Housekeeper, MD  Encounter Date: 04/12/2015      PT End of Session - 04/12/15 1137    Visit Number 21   Number of Visits 26   Date for PT Re-Evaluation 04/15/15   Authorization Type medicare   Authorization - Visit Number 21   Authorization - Number of Visits 26   PT Start Time 1000   PT Stop Time 1129   PT Time Calculation (min) 89 min   Activity Tolerance Patient tolerated treatment well      Past Medical History  Diagnosis Date  . Rotator cuff injury   . Fatigue   . Edema   . Hyperlipidemia   . Depression   . Anxiety   . Syncope   . Chest pain     Normal cardiac cath 5/09  . Dumping syndrome   . Lumbar spondylosis   . Cervical spondylosis with myelopathy   . Hypertension   . Fibromyalgia   . PONV (postoperative nausea and vomiting)   . Restless leg syndrome   . Hypothyroidism   . Anemia   . Tremors of nervous system   . Arthritis   . Brain lesion     2 types  . Stroke     TIAs "mini strokes"- unsure of last TIA  . Lymphedema     seeing specialist for this    Past Surgical History  Procedure Laterality Date  . Rotator cuff repair Bilateral   . Total knee arthroplasty Left   . Appendectomy  1966  . Lumbar disc surgery      x2  . Total abdominal hysterectomy    . Total knee arthroplasty Right 11/18/2012    Procedure: TOTAL KNEE ARTHROPLASTY;  Surgeon: Yvette Rack., MD;  Location: [facility];  Service: Orthopedics;  Laterality: Right;  . Knee arthroscopy Bilateral   . Cholecystectomy  2015  . Hip bursa surgery Bilateral   . Lumbar fusion    . Coronary angiogram  2009    Normal coronaries  . Cholecystectomy  2015    There were no vitals filed for this visit.  Visit Diagnosis:   Lymphedema  Difficulty walking      Subjective Assessment - 04/12/15 1135    Subjective Pt states that she can move her legs much easier and that is is easier to walk.  Pt has been increasing her walking due to it not being as difficulty now.  Pt states she tried on a pair of pants that were to tight in the legs and they fit perfiect now.    Currently in Pain? No/denies            [facility] Adult PT Treatment/Exercise - 04/12/15 0001    Posture/Postural Control   Posture Comments weight 244 Lbs   Manual Therapy   Manual Therapy Manual Lymphatic Drainage (MLD)   Manual therapy comments bandaging full bilateral LE's using multi layered short stretch bandaging, 1/2" foam following MLD   Manual Lymphatic Drainage (MLD) For bilateral LE's routing lymph fluid via bilateral inguino-axillary anastomosis anteriorly.             PT Short Term Goals - 04/10/15 1207    PT SHORT TERM GOAL #1   Title Pt to be I in  skin care and care of bandages   Time 2   Period Weeks   Status On-going   PT SHORT TERM GOAL #2   Title Pt to verbalize precautions for decreasing risk of infection   Time 2   Period Weeks   Status Achieved   PT SHORT TERM GOAL #3   Title reduce volume by 30%   Time 4   Period Weeks   Status Achieved           PT Long Term Goals - 04/10/15 1207    PT LONG TERM GOAL #1   Title Reduce volume by 50%   Time 8   Period Weeks   Status Achieved   PT LONG TERM GOAL #2   Title Pt to verbalize understanding of the maintenance phase of tx   Time 8   Period Weeks   Status Achieved   PT LONG TERM GOAL #3   Title Pt to understand where and how often to be fitted for a compression garment   Time 8   Period Weeks   Status Achieved   PT LONG TERM GOAL #4   Title Pt to be able to lift LE onto the treatment table to allow ease of getting in and our of bed   Time 8   Period Weeks   Status Achieved   PT LONG TERM GOAL #5   Title Pt to state she is able to walk for 10  minutes without difficulty ( pt limited to 10 minutes due to back issues)   Time 8   Period Weeks   Status On-going               Plan - 04/12/15 1138    Clinical Impression Statement Pt has improved standing tolerance while bandaging thigh area B.  Pt states that her insurance will pay for the compression pump but not the garments but she is going to go ahead and pay for the garments today.  She hopes to have them in two weeks.    PT Next Visit Plan continue manual techniques until pt obtains her garment.           G-Codes - 05-04-2015 1652    Functional Assessment Tool Used volume   Functional Limitation Other PT primary   Other PT Primary Current Status (V8938) At least 20 percent but less than 40 percent impaired, limited or restricted   Other PT Primary Goal Status (B0175) At least 20 percent but less than 40 percent impaired, limited or restricted      Problem List Patient Active Problem List   Diagnosis Date Noted  . Normal coronary arteries 2009 01/14/2015  . Obesity-BMI 40 01/14/2015  . Restless leg 01/14/2015  . Chest pain 01/14/2015  . Back pain 01/14/2015  . Renal insufficiency 01/14/2015  . Dementia- mild memory issues 01/14/2015  . Occult blood positive stool 12/14/2014  . Anemia 12/14/2014  . Family history of colon cancer 12/14/2014  . Change in bowel habits 12/14/2014  . GERD (gastroesophageal reflux disease) 12/14/2014  . Dyspnea 05/17/2012  . Lymphedema 05/17/2012  . Weight gain 05/17/2012  . HTN (hypertension) 05/17/2012   Rayetta Humphrey, PT CLT [phone] 04/12/2015, 11:40 AM  [place], [place], [address] Phone: [phone]   Fax:  [phone]

## 2015-04-15 ENCOUNTER — Ambulatory Visit (HOSPITAL_COMMUNITY): Payer: Medicare Other | Admitting: Physical Therapy

## 2015-04-15 DIAGNOSIS — I890 Lymphedema, not elsewhere classified: Secondary | ICD-10-CM | POA: Diagnosis not present

## 2015-04-15 DIAGNOSIS — R262 Difficulty in walking, not elsewhere classified: Secondary | ICD-10-CM

## 2015-04-15 NOTE — Therapy (Signed)
[place], [place], [address] Phone: [phone]   Fax:  [phone]  Physical Therapy Treatment  Patient Details  Name: [name] MRN: [id number] Date of Birth: 06-06-1946 Referring Provider:  Dione Housekeeper, MD  Encounter Date: 04/15/2015      PT End of Session - 04/15/15 1152    Visit Number 22   Number of Visits 26   Date for PT Re-Evaluation 04/15/15   Authorization Type medicare   Authorization - Visit Number 22   Authorization - Number of Visits 26   PT Start Time 1025   PT Stop Time 1150   PT Time Calculation (min) 85 min   Activity Tolerance Patient tolerated treatment well   Behavior During Therapy [facility] for tasks assessed/performed      Past Medical History  Diagnosis Date  . Rotator cuff injury   . Fatigue   . Edema   . Hyperlipidemia   . Depression   . Anxiety   . Syncope   . Chest pain     Normal cardiac cath 5/09  . Dumping syndrome   . Lumbar spondylosis   . Cervical spondylosis with myelopathy   . Hypertension   . Fibromyalgia   . PONV (postoperative nausea and vomiting)   . Restless leg syndrome   . Hypothyroidism   . Anemia   . Tremors of nervous system   . Arthritis   . Brain lesion     2 types  . Stroke     TIAs "mini strokes"- unsure of last TIA  . Lymphedema     seeing specialist for this    Past Surgical History  Procedure Laterality Date  . Rotator cuff repair Bilateral   . Total knee arthroplasty Left   . Appendectomy  1966  . Lumbar disc surgery      x2  . Total abdominal hysterectomy    . Total knee arthroplasty Right 11/18/2012    Procedure: TOTAL KNEE ARTHROPLASTY;  Surgeon: Yvette Rack., MD;  Location: [place];  Service: Orthopedics;  Laterality: Right;  . Knee arthroscopy Bilateral   . Cholecystectomy  2015  . Hip bursa surgery Bilateral   . Lumbar fusion    . Coronary angiogram  2009    Normal coronaries  . Cholecystectomy  2015     There were no vitals filed for this visit.  Visit Diagnosis:  Lymphedema  Difficulty walking      Subjective Assessment - 04/15/15 1157    Subjective Pt states she had to remove her bandages Friday night due to too much cotton behind her knees.  Pt reported no other difficulties or pain.   Currently in Pain? No/denies                         [facility] Adult PT Treatment/Exercise - 04/15/15 1151    Manual Therapy   Manual Therapy Manual Lymphatic Drainage (MLD)   Manual therapy comments bandaging full bilateral LE's using multi layered short stretch bandaging, 1/2" foam following MLD   Manual Lymphatic Drainage (MLD) For bilateral LE's routing lymph fluid via bilateral inguino-axillary anastomosis anteriorly and posteriorly.                   PT Short Term Goals - 04/10/15 1207    PT SHORT TERM GOAL #1   Title Pt to be I in skin care and care of bandages   Time 2  Period Weeks   Status On-going   PT SHORT TERM GOAL #2   Title Pt to verbalize precautions for decreasing risk of infection   Time 2   Period Weeks   Status Achieved   PT SHORT TERM GOAL #3   Title reduce volume by 30%   Time 4   Period Weeks   Status Achieved           PT Long Term Goals - 04/10/15 1207    PT LONG TERM GOAL #1   Title Reduce volume by 50%   Time 8   Period Weeks   Status Achieved   PT LONG TERM GOAL #2   Title Pt to verbalize understanding of the maintenance phase of tx   Time 8   Period Weeks   Status Achieved   PT LONG TERM GOAL #3   Title Pt to understand where and how often to be fitted for a compression garment   Time 8   Period Weeks   Status Achieved   PT LONG TERM GOAL #4   Title Pt to be able to lift LE onto the treatment table to allow ease of getting in and our of bed   Time 8   Period Weeks   Status Achieved   PT LONG TERM GOAL #5   Title Pt to state she is able to walk for 10 minutes without difficulty ( pt limited to 10 minutes due  to back issues)   Time 8   Period Weeks   Status On-going               Plan - 04/15/15 1155    Clinical Impression Statement Pt able to remain standing fo complete upper thigh bandaging bilaterally without rest break.  One layer of cotton used behind knees per patient request due to discomfort when bulky.  Foam also cut down for dorsal aspects of feet to increase comfort.    PT Next Visit Plan continue manual techniques until pt obtains her garment.         Problem List Patient Active Problem List   Diagnosis Date Noted  . Normal coronary arteries 2009 01/14/2015  . Obesity-BMI 40 01/14/2015  . Restless leg 01/14/2015  . Chest pain 01/14/2015  . Back pain 01/14/2015  . Renal insufficiency 01/14/2015  . Dementia- mild memory issues 01/14/2015  . Occult blood positive stool 12/14/2014  . Anemia 12/14/2014  . Family history of colon cancer 12/14/2014  . Change in bowel habits 12/14/2014  . GERD (gastroesophageal reflux disease) 12/14/2014  . Dyspnea 05/17/2012  . Lymphedema 05/17/2012  . Weight gain 05/17/2012  . HTN (hypertension) 05/17/2012    [name], PTA/CLT [phone]  04/15/2015, 11:59 AM  [place], [place], [address] Phone: [phone]   Fax:  [phone]

## 2015-04-17 ENCOUNTER — Ambulatory Visit (HOSPITAL_COMMUNITY): Payer: Medicare Other | Admitting: Physical Therapy

## 2015-04-17 DIAGNOSIS — R262 Difficulty in walking, not elsewhere classified: Secondary | ICD-10-CM

## 2015-04-17 DIAGNOSIS — I890 Lymphedema, not elsewhere classified: Secondary | ICD-10-CM

## 2015-04-17 NOTE — Therapy (Signed)
[place], [place], [address] Phone: [phone]   Fax:  [phone]  Physical Therapy Treatment  Patient Details  Name: [name] MRN: [id number] Date of Birth: Aug 06, 1946 Referring Provider:  Dione Housekeeper, MD  Encounter Date: 04/17/2015      PT End of Session - 04/17/15 1130    Visit Number 23   Number of Visits 26   Date for PT Re-Evaluation 04/25/15   Authorization Type medicare   Authorization - Visit Number 23   Authorization - Number of Visits 26   PT Start Time 1020   PT Stop Time 1120   PT Time Calculation (min) 60 min   Activity Tolerance Patient tolerated treatment well   Behavior During Therapy [facility] for tasks assessed/performed      Past Medical History  Diagnosis Date  . Rotator cuff injury   . Fatigue   . Edema   . Hyperlipidemia   . Depression   . Anxiety   . Syncope   . Chest pain     Normal cardiac cath 5/09  . Dumping syndrome   . Lumbar spondylosis   . Cervical spondylosis with myelopathy   . Hypertension   . Fibromyalgia   . PONV (postoperative nausea and vomiting)   . Restless leg syndrome   . Hypothyroidism   . Anemia   . Tremors of nervous system   . Arthritis   . Brain lesion     2 types  . Stroke     TIAs "mini strokes"- unsure of last TIA  . Lymphedema     seeing specialist for this    Past Surgical History  Procedure Laterality Date  . Rotator cuff repair Bilateral   . Total knee arthroplasty Left   . Appendectomy  1966  . Lumbar disc surgery      x2  . Total abdominal hysterectomy    . Total knee arthroplasty Right 11/18/2012    Procedure: TOTAL KNEE ARTHROPLASTY;  Surgeon: Yvette Rack., MD;  Location: [place];  Service: Orthopedics;  Laterality: Right;  . Knee arthroscopy Bilateral   . Cholecystectomy  2015  . Hip bursa surgery Bilateral   . Lumbar fusion    . Coronary angiogram  2009    Normal coronaries  . Cholecystectomy  2015     There were no vitals filed for this visit.  Visit Diagnosis:  Lymphedema  Difficulty walking      Subjective Assessment - 04/17/15 1126    Subjective Pt states that she has brought her compression stockings in; (old pair to see if they could maintain compression instead of having to wear the multilayer short stretch bandages.    Currently in Pain? No/denies                [facility] Adult PT Treatment/Exercise - 04/17/15 1127    Self-Care   Self-Care Other Self-Care Comments   Other Self-Care Comments  worked on Chartered certified accountant with both the gator and the sleeve technique    Manual Therapy   Manual Therapy Manual Lymphatic Drainage (MLD)   Manual Lymphatic Drainage (MLD) For bilateral LE's routing lymph fluid via bilateral inguino-axillary anastomosis anteriorly.                PT Education - 04/17/15 1129    Education provided Yes   Education Details On use of donning assistive devices    Person(s) Educated Patient;Child(ren)   Methods Explanation;Demonstration  Comprehension Verbalized understanding;Returned demonstration          PT Short Term Goals - 04/10/15 1207    PT SHORT TERM GOAL #1   Title Pt to be I in skin care and care of bandages   Time 2   Period Weeks   Status On-going   PT SHORT TERM GOAL #2   Title Pt to verbalize precautions for decreasing risk of infection   Time 2   Period Weeks   Status Achieved   PT SHORT TERM GOAL #3   Title reduce volume by 30%   Time 4   Period Weeks   Status Achieved           PT Long Term Goals - 04/10/15 1207    PT LONG TERM GOAL #1   Title Reduce volume by 50%   Time 8   Period Weeks   Status Achieved   PT LONG TERM GOAL #2   Title Pt to verbalize understanding of the maintenance phase of tx   Time 8   Period Weeks   Status Achieved   PT LONG TERM GOAL #3   Title Pt to understand where and how often to be fitted for a compression garment   Time 8   Period Weeks   Status  Achieved   PT LONG TERM GOAL #4   Title Pt to be able to lift LE onto the treatment table to allow ease of getting in and our of bed   Time 8   Period Weeks   Status Achieved   PT LONG TERM GOAL #5   Title Pt to state she is able to walk for 10 minutes without difficulty ( pt limited to 10 minutes due to back issues)   Time 8   Period Weeks   Status On-going               Plan - 04/17/15 1131    Clinical Impression Statement Pt educated in Multimedia programmer as well as donning stand.  Pt prefers gator but will require additional training.  Pt states she normally sleeps in compression garments therefore once garments were donned it was decided that we would not wrap with short stretch.     PT Next Visit Plan Contnue manual techniques we will have time for both anterior and posterior; instruct in using gator.  Anticipate discharge next week.  Remeasure at end visit.         Problem List Patient Active Problem List   Diagnosis Date Noted  . Normal coronary arteries 2009 01/14/2015  . Obesity-BMI 40 01/14/2015  . Restless leg 01/14/2015  . Chest pain 01/14/2015  . Back pain 01/14/2015  . Renal insufficiency 01/14/2015  . Dementia- mild memory issues 01/14/2015  . Occult blood positive stool 12/14/2014  . Anemia 12/14/2014  . Family history of colon cancer 12/14/2014  . Change in bowel habits 12/14/2014  . GERD (gastroesophageal reflux disease) 12/14/2014  . Dyspnea 05/17/2012  . Lymphedema 05/17/2012  . Weight gain 05/17/2012  . HTN (hypertension) 05/17/2012   Rayetta Humphrey, PT CLT [phone] 04/17/2015, 11:39 AM  [place], [place], [address] Phone: [phone]   Fax:  [phone]

## 2015-04-19 ENCOUNTER — Ambulatory Visit (HOSPITAL_COMMUNITY): Payer: Medicare Other | Admitting: Physical Therapy

## 2015-04-19 DIAGNOSIS — I890 Lymphedema, not elsewhere classified: Secondary | ICD-10-CM | POA: Diagnosis not present

## 2015-04-19 DIAGNOSIS — R262 Difficulty in walking, not elsewhere classified: Secondary | ICD-10-CM

## 2015-04-19 NOTE — Therapy (Signed)
[place], [place], [address] Phone: [phone]   Fax:  [phone]  Physical Therapy Treatment  Patient Details  Name: [name] MRN: [id number] Date of Birth: Jan 26, 1946 Referring Provider:  Dione Housekeeper, MD  Encounter Date: 04/19/2015      PT End of Session - 04/19/15 1138    Visit Number 24   Number of Visits 25   Date for PT Re-Evaluation 04/25/15   Authorization Type medicare   Authorization - Visit Number 24   Authorization - Number of Visits 25   PT Start Time 6433   PT Stop Time 1132   PT Time Calculation (min) 77 min   Activity Tolerance Patient tolerated treatment well      Past Medical History  Diagnosis Date  . Rotator cuff injury   . Fatigue   . Edema   . Hyperlipidemia   . Depression   . Anxiety   . Syncope   . Chest pain     Normal cardiac cath 5/09  . Dumping syndrome   . Lumbar spondylosis   . Cervical spondylosis with myelopathy   . Hypertension   . Fibromyalgia   . PONV (postoperative nausea and vomiting)   . Restless leg syndrome   . Hypothyroidism   . Anemia   . Tremors of nervous system   . Arthritis   . Brain lesion     2 types  . Stroke     TIAs "mini strokes"- unsure of last TIA  . Lymphedema     seeing specialist for this    Past Surgical History  Procedure Laterality Date  . Rotator cuff repair Bilateral   . Total knee arthroplasty Left   . Appendectomy  1966  . Lumbar disc surgery      x2  . Total abdominal hysterectomy    . Total knee arthroplasty Right 11/18/2012    Procedure: TOTAL KNEE ARTHROPLASTY;  Surgeon: Yvette Rack., MD;  Location: [place];  Service: Orthopedics;  Laterality: Right;  . Knee arthroscopy Bilateral   . Cholecystectomy  2015  . Hip bursa surgery Bilateral   . Lumbar fusion    . Coronary angiogram  2009    Normal coronaries  . Cholecystectomy  2015    There were no vitals filed for this visit.  Visit Diagnosis:   Lymphedema  Difficulty walking      Subjective Assessment - 04/19/15 1136    Subjective Pt states that she was able to get her old compression stockings on and off by herself.     Pertinent History +lymphedema; back surgery; B TKR; shoulder replacement.   Currently in Pain? No/denies               [facility] Adult PT Treatment/Exercise - 04/19/15 0001    Posture/Postural Control   Posture Comments weight 244   Self-Care   Self-Care Other Self-Care Comments   Other Self-Care Comments  worked on Chartered certified accountant with both the gator and the sleeve technique    Manual Therapy   Manual Therapy Manual Lymphatic Drainage (MLD)   Manual Lymphatic Drainage (MLD) For bilateral LE's routing lymph fluid via bilateral inguino-axillary anastomosis anteriorly. as well as posteriorly.  Posterior was done in sidelying position.             PT Education - 04/19/15 1138    Education provided Yes   Education Details Librarian, academic) Educated Patient   Methods  Explanation   Comprehension Verbalized understanding;Returned demonstration          PT Short Term Goals - 04/10/15 1207    PT SHORT TERM GOAL #1   Title Pt to be I in skin care and care of bandages   Time 2   Period Weeks   Status On-going   PT SHORT TERM GOAL #2   Title Pt to verbalize precautions for decreasing risk of infection   Time 2   Period Weeks   Status Achieved   PT SHORT TERM GOAL #3   Title reduce volume by 30%   Time 4   Period Weeks   Status Achieved           PT Long Term Goals - 04/10/15 1207    PT LONG TERM GOAL #1   Title Reduce volume by 50%   Time 8   Period Weeks   Status Achieved   PT LONG TERM GOAL #2   Title Pt to verbalize understanding of the maintenance phase of tx   Time 8   Period Weeks   Status Achieved   PT LONG TERM GOAL #3   Title Pt to understand where and how often to be fitted for a compression garment   Time 8   Period Weeks   Status Achieved    PT LONG TERM GOAL #4   Title Pt to be able to lift LE onto the treatment table to allow ease of getting in and our of bed   Time 8   Period Weeks   Status Achieved   PT LONG TERM GOAL #5   Title Pt to state she is able to walk for 10 minutes without difficulty ( pt limited to 10 minutes due to back issues)   Time 8   Period Weeks   Status On-going               Plan - 04/19/15 1139    Clinical Impression Statement Pt able to get compression stockings on just as easily using rubber glovesa as with gator and other devices.  Pt demonstrated I ability to don compression garments.  Pt will be seen one more visit and then will be discharge.   PT Next Visit Plan Remeasure/reweigh next visit as well as answering any quesions pt may have.         Problem List Patient Active Problem List   Diagnosis Date Noted  . Normal coronary arteries 2009 01/14/2015  . Obesity-BMI 40 01/14/2015  . Restless leg 01/14/2015  . Chest pain 01/14/2015  . Back pain 01/14/2015  . Renal insufficiency 01/14/2015  . Dementia- mild memory issues 01/14/2015  . Occult blood positive stool 12/14/2014  . Anemia 12/14/2014  . Family history of colon cancer 12/14/2014  . Change in bowel habits 12/14/2014  . GERD (gastroesophageal reflux disease) 12/14/2014  . Dyspnea 05/17/2012  . Lymphedema 05/17/2012  . Weight gain 05/17/2012  . HTN (hypertension) 05/17/2012    Azucena Freed PT/CLT [phone] 04/19/2015, 11:42 AM  [place] [address] [place], [place], [address] Phone: [phone]   Fax:  [phone]

## 2015-04-22 ENCOUNTER — Encounter: Payer: Self-pay | Admitting: Cardiovascular Disease

## 2015-04-22 ENCOUNTER — Ambulatory Visit (HOSPITAL_COMMUNITY): Payer: Medicare Other | Admitting: Physical Therapy

## 2015-04-22 ENCOUNTER — Ambulatory Visit (INDEPENDENT_AMBULATORY_CARE_PROVIDER_SITE_OTHER): Payer: Medicare Other | Admitting: Cardiovascular Disease

## 2015-04-22 VITALS — BP 130/60 | HR 64 | Ht 66.0 in | Wt 244.8 lb

## 2015-04-22 DIAGNOSIS — I890 Lymphedema, not elsewhere classified: Secondary | ICD-10-CM

## 2015-04-22 DIAGNOSIS — I5189 Other ill-defined heart diseases: Secondary | ICD-10-CM

## 2015-04-22 DIAGNOSIS — I519 Heart disease, unspecified: Secondary | ICD-10-CM

## 2015-04-22 NOTE — Progress Notes (Signed)
Chief Complaint  Patient presents with  . Leg Swelling    History of Present Illness: 69 yo female with history of chronic lymphedema, back pain, HTN, fibromyalgia who is here today to establish in my office. She was seen by Kerin Ransom, PA-C in May 2016 as a new patient for evaluation of lower extremity edema and requested to follow in my office. I am meeting her for the first time today. She had a normal cath in 2009. Echo 03/05/15 with normal LV systolic function, grade 1 diastolic dysfunction, no significant valvular disease. Stress myoview 02/21/15 without ischemia. She had been managed on Lasix in primary care but her renal function worsened so Lasix was stopped. She has been seen in Nephrology. She has been on metolazone 5 mg three times weekly. She has also been followed in the lymphedema clinic.   She is here today for follow up. She feels much better. Her lower extremity swelling is much better since being aggressively treated in the lymphedema clinic.     Primary Care Physician: [place]  Past Medical History  Diagnosis Date  . Rotator cuff injury   . Fatigue   . Edema   . Hyperlipidemia   . Depression   . Anxiety   . Syncope   . Chest pain     Normal cardiac cath 5/09  . Dumping syndrome   . Lumbar spondylosis   . Cervical spondylosis with myelopathy   . Hypertension   . Fibromyalgia   . PONV (postoperative nausea and vomiting)   . Restless leg syndrome   . Hypothyroidism   . Anemia   . Tremors of nervous system   . Arthritis   . Brain lesion     2 types  . Stroke     TIAs "mini strokes"- unsure of last TIA  . Lymphedema     seeing specialist for this    Past Surgical History  Procedure Laterality Date  . Rotator cuff repair Bilateral   . Total knee arthroplasty Left   . Appendectomy  1966  . Lumbar disc surgery      x2  . Total abdominal hysterectomy    . Total knee arthroplasty Right 11/18/2012    Procedure: TOTAL KNEE ARTHROPLASTY;  Surgeon: Yvette Rack., MD;  Location: [facility];  Service: Orthopedics;  Laterality: Right;  . Knee arthroscopy Bilateral   . Cholecystectomy  2015  . Hip bursa surgery Bilateral   . Lumbar fusion    . Coronary angiogram  2009    Normal coronaries  . Cholecystectomy  2015    Current Outpatient Prescriptions  Medication Sig Dispense Refill  . alendronate (FOSAMAX) 70 MG tablet Take 70 mg by mouth once a week. Take with a full glass of water on an empty stomach.    . ALPRAZolam (XANAX) 0.5 MG tablet Take 0.5 mg by mouth at bedtime.     Marland Kitchen amLODipine (NORVASC) 5 MG tablet Take by mouth Daily.    Marland Kitchen aspirin 81 MG tablet Take 81 mg by mouth at bedtime.    . baclofen (LIORESAL) 10 MG tablet Take 10 mg by mouth 3 (three) times daily.    Marland Kitchen donepezil (ARICEPT) 10 MG tablet Take 1 tablet by mouth daily.    Marland Kitchen escitalopram (LEXAPRO) 20 MG tablet Take 1.5 tablets by mouth daily with supper.    . folic acid (FOLVITE) 1 MG tablet Take 1 mg by mouth 2 (two) times daily.    Marland Kitchen lamoTRIgine (LAMICTAL) 100  MG tablet Take 1 tablet by mouth 2 (two) times daily.    Marland Kitchen levothyroxine (SYNTHROID, LEVOTHROID) 50 MCG tablet Take 50 mcg by mouth daily before breakfast.    . Linaclotide (LINZESS) 145 MCG CAPS capsule Take 1 capsule daily on an empty stomach at least 30 minutes before a meal 30 capsule 0  . Magnesium 250 MG TABS Take 2 tablets by mouth 2 (two) times daily.    . metolazone (ZAROXOLYN) 5 MG tablet Take 5 mg by mouth 3 (three) times a week.    Marland Kitchen omeprazole (PRILOSEC) 40 MG capsule Take 1 capsule by mouth daily.    Marland Kitchen oxyCODONE-acetaminophen (PERCOCET) 7.5-325 MG per tablet Take 1 tablet by mouth. Every 4-6 hours as needed    . pramipexole (MIRAPEX) 0.25 MG tablet Take 0.5 mg by mouth at bedtime and may repeat dose one time if needed.     . pravastatin (PRAVACHOL) 80 MG tablet Take 1 tablet by mouth daily.    Marland Kitchen spironolactone (ALDACTONE) 25 MG tablet Take 25 mg by mouth Twice daily.    . Vitamin D, Ergocalciferol,  (DRISDOL) 50000 UNITS CAPS Take 50,000 Units by mouth every 30 (thirty) days. Patient takes this on first Friday of the month     No current facility-administered medications for this visit.    Allergies  Allergen Reactions  . Oxycodone Itching and Hives  . Hydromorphone Other (See Comments)    hallucinations  . Morphine And Related Other (See Comments)    Hallucinations; pt denies this    Social History   Social History  . Marital Status: Widowed    Spouse Name: N/A  . Number of Children: 3  . Years of Education: N/A   Occupational History  . retired    Social History Main Topics  . Smoking status: Never Smoker   . Smokeless tobacco: Never Used  . Alcohol Use: No  . Drug Use: No  . Sexual Activity: Not Currently   Other Topics Concern  . Not on file   Social History Narrative   Pt does use caffeine. Lives alone. 3 children. Retired.    Family History  Problem Relation Age of Onset  . Heart disease Mother   . Heart failure Mother   . Kidney disease Mother   . Lung cancer Father   . Colon cancer Brother   . Cancer Brother     malignant thymoma  . Heart disease Brother   . Tremor Maternal Grandmother 90  . Ovarian cancer Maternal Grandmother   . Uterine cancer Maternal Grandmother   . Irritable bowel syndrome Daughter   . Esophageal cancer Neg Hx   . Stomach cancer Neg Hx   . Rectal cancer Neg Hx     Review of Systems:  As stated in the HPI and otherwise negative.   BP 130/60 mmHg  Pulse 64  Ht 5\' 6"  (1.676 m)  Wt 244 lb 12.8 oz (111.041 kg)  BMI 39.53 kg/m2  SpO2 95%  Physical Examination: General: Well developed, well nourished, NAD HEENT: OP clear, mucus membranes moist SKIN: warm, dry. No rashes. Neuro: No focal deficits Musculoskeletal: Muscle strength 5/5 all ext Psychiatric: Mood and affect normal Neck: No JVD, no carotid bruits, no thyromegaly, no lymphadenopathy. Lungs:Clear bilaterally, no wheezes, rhonci, crackles Cardiovascular:  Regular rate and rhythm. No murmurs, gallops or rubs. Abdomen:Soft. Bowel sounds present. Non-tender.  Extremities: LE enlarged bilaterally, no pitting edema. Appears to be c/w her chronic Lymphedema.   EKG:  EKG is not  ordered today. The ekg ordered today demonstrates   Recent Labs: No results found for requested labs within last 365 days.   Lipid Panel No results found for: CHOL, TRIG, HDL, CHOLHDL, VLDL, LDLCALC, LDLDIRECT   Wt Readings from Last 3 Encounters:  04/22/15 244 lb 12.8 oz (111.041 kg)  02/21/15 252 lb (114.306 kg)  02/05/15 246 lb (111.585 kg)     Other studies Reviewed: Additional studies/ records that were reviewed today include: . Review of the above records demonstrates:    Assessment and Plan:   1. Chronic diastolic CHF: She has chronic lymphedema and her swelling in the LE is related to this mostly. With renal insufficiency, she has stopped her daily Lasix and is being seen in Nephrology. Stress test is normal. Echo with normal systolic function of the LV. She has mild diastolic dysfunction. I would not change her current diuretic plan with renal insufficiency. No further cardiac workup at this time.   Current medicines are reviewed at length with the patient today.  The patient does not have concerns regarding medicines.  The following changes have been made:  no change  Labs/ tests ordered today include:  No orders of the defined types were placed in this encounter.    Disposition:   FU with me in 12  months  Signed, Lauree Chandler, MD 04/22/2015 3:47 PM    [facility] [place], [place], [place]  [address] Phone: [phone]; Fax: [phone]

## 2015-04-22 NOTE — Therapy (Signed)
[place], [place], [address] Phone: [phone]   Fax:  [phone]  Physical Therapy Treatment  Patient Details  Name: [name] MRN: [id number] Date of Birth: 09/18/1945 Referring Provider:  Dione Housekeeper, MD  Encounter Date: 04/22/2015      PT End of Session - 04/22/15 1415    PT Start Time 1300   PT Stop Time 1415   PT Time Calculation (min) 75 min   Activity Tolerance Patient tolerated treatment well      Past Medical History  Diagnosis Date  . Rotator cuff injury   . Fatigue   . Edema   . Hyperlipidemia   . Depression   . Anxiety   . Syncope   . Chest pain     Normal cardiac cath 5/09  . Dumping syndrome   . Lumbar spondylosis   . Cervical spondylosis with myelopathy   . Hypertension   . Fibromyalgia   . PONV (postoperative nausea and vomiting)   . Restless leg syndrome   . Hypothyroidism   . Anemia   . Tremors of nervous system   . Arthritis   . Brain lesion     2 types  . Stroke     TIAs "mini strokes"- unsure of last TIA  . Lymphedema     seeing specialist for this    Past Surgical History  Procedure Laterality Date  . Rotator cuff repair Bilateral   . Total knee arthroplasty Left   . Appendectomy  1966  . Lumbar disc surgery      x2  . Total abdominal hysterectomy    . Total knee arthroplasty Right 11/18/2012    Procedure: TOTAL KNEE ARTHROPLASTY;  Surgeon: Yvette Rack., MD;  Location: [facility];  Service: Orthopedics;  Laterality: Right;  . Knee arthroscopy Bilateral   . Cholecystectomy  2015  . Hip bursa surgery Bilateral   . Lumbar fusion    . Coronary angiogram  2009    Normal coronaries  . Cholecystectomy  2015    There were no vitals filed for this visit.  Visit Diagnosis:  Lymphedema      Subjective Assessment - 04/22/15 1407    Subjective Pt states that she can tell that her swelling is up.  States that she wore her compression hose all weekend but  she has not been voiding and she feels more swollen wholistically   Pertinent History +lymphedema; back surgery; B TKR; shoulder replacement.   How long can you stand comfortably? more due to her back than anything else 5 minutes was 5 minutes    How long can you walk comfortably? Pt walked in [place] with walker for about 15-20 minutes was 5-10.    Patient Stated Goals Pt states she is able to lift her leg now and it is easier to walk    Currently in Pain? No/denies          03/29/2015 03/29/2015 04/22/2015 04/22/2015  Rt LT Rt LT  22.6 23.1 22.6 22.1  34.50 35.10 31.50 24.30  31.80 33.50 32.50 39.70  37.50 37.30 41.20 42.00  39.50 40.80 44.80 44.50  43.30 44.00 48.70 47.00  46.40 46.00 51.20 49.40  49.00 50.10 54.80 52.50  52.40 54.70 56.70 57.00  55.20 58.10 53.50 58.20  55.50 57.20 51.00 53.10  52.40 56.50 56.40 55.30  58.80 60.80 59.80 61.70  61.00 63.60 61.80 62.10  24235.36 [phone] 00867.61 254 709 0239  7124.580 10529.44 99833.825 05397.673                    [facility] Adult PT Treatment/Exercise - 04/22/15 0001    Posture/Postural Control   Posture Comments 245.8   Self-Care   Self-Care Other Self-Care Comments   Other Self-Care Comments  worked on [employer] with both the gator and the sleeve technique    Manual Therapy   Manual Therapy Manual Lymphatic Drainage (MLD)   Manual Lymphatic Drainage (MLD) For bilateral LE's routing lymph fluid via bilateral inguino-axillary anastomosis anteriorly. as well as posteriorly.  Posterior was done in sidelying position.                   PT Short Term Goals - 04/10/15 1207    PT SHORT TERM GOAL #1   Title Pt to be I in skin care and care of bandages   Time 2   Period Weeks   Status On-going   PT SHORT TERM GOAL #2   Title Pt to verbalize precautions for decreasing risk of infection   Time 2   Period Weeks   Status Achieved   PT SHORT TERM GOAL #3   Title  reduce volume by 30%   Time 4   Period Weeks   Status Achieved           PT Long Term Goals - 04/10/15 1207    PT LONG TERM GOAL #1   Title Reduce volume by 50%   Time 8   Period Weeks   Status Achieved   PT LONG TERM GOAL #2   Title Pt to verbalize understanding of the maintenance phase of tx   Time 8   Period Weeks   Status Achieved   PT LONG TERM GOAL #3   Title Pt to understand where and how often to be fitted for a compression garment   Time 8   Period Weeks   Status Achieved   PT LONG TERM GOAL #4   Title Pt to be able to lift LE onto the treatment table to allow ease of getting in and our of bed   Time 8   Period Weeks   Status Achieved   PT LONG TERM GOAL #5   Title Pt to state she is able to walk for 10 minutes without difficulty ( pt limited to 10 minutes due to back issues)   Time 8   Period Weeks   Status On-going               Plan - 04/22/15 1415    Clinical Impression Statement Pt volume and weight are both up.   Pt thought she would heve compression pump at this time but called and MD still has not signed for it.   Garments were shipped from [place] on Friday.  We will resume treatment and wrap with short stretch next treatment,(pt did not bring her short stretch bandages today), to decrease volume.    PT Next Visit Plan /reweigh next visit as well as answering any quesions pt may have.         Problem List Patient Active Problem List   Diagnosis Date Noted  . Normal coronary arteries 2009 01/14/2015  . Obesity-BMI 40 01/14/2015  . Restless leg 01/14/2015  . Chest pain 01/14/2015  . Back pain 01/14/2015  . Renal insufficiency 01/14/2015  . Dementia- mild memory issues 01/14/2015  . Occult blood positive stool 12/14/2014  .  Anemia 12/14/2014  . Family history of colon cancer 12/14/2014  . Change in bowel habits 12/14/2014  . GERD (gastroesophageal reflux disease) 12/14/2014  . Dyspnea 05/17/2012  . Lymphedema 05/17/2012  . Weight  gain 05/17/2012  . HTN (hypertension) 05/17/2012   Rayetta Humphrey, PT CLT [phone] 04/22/2015, 2:22 PM  [facility] [address] [place], [place], [address] Phone: [phone]   Fax:  [phone]

## 2015-04-22 NOTE — Patient Instructions (Signed)
Medication Instructions:  Your physician recommends that you continue on your current medications as directed. Please refer to the Current Medication list given to you today.   Labwork: none  Testing/Procedures: none  Follow-Up: Your physician wants you to follow-up in:  12 months.  You will receive a reminder letter in the mail two months in advance. If you don't receive a letter, please call our office to schedule the follow-up appointment.        

## 2015-04-24 ENCOUNTER — Ambulatory Visit (HOSPITAL_COMMUNITY): Payer: Medicare Other | Admitting: Physical Therapy

## 2015-04-24 ENCOUNTER — Encounter (HOSPITAL_COMMUNITY): Payer: Medicare Other | Admitting: Physical Therapy

## 2015-04-24 DIAGNOSIS — R262 Difficulty in walking, not elsewhere classified: Secondary | ICD-10-CM

## 2015-04-24 DIAGNOSIS — I890 Lymphedema, not elsewhere classified: Secondary | ICD-10-CM

## 2015-04-24 NOTE — Therapy (Signed)
[place], [place], [address] Phone: [phone]   Fax:  [phone]  Physical Therapy Treatment  Patient Details  Name: [name] MRN: [id number] Date of Birth: Dec 13, 1945 Referring Provider:  Dione Housekeeper, MD  Encounter Date: 04/24/2015      PT End of Session - 04/24/15 1211    Visit Number 26   Number of Visits 27   Date for PT Re-Evaluation 04/25/15   Authorization Type medicare   Authorization - Visit Number 26   Authorization - Number of Visits 27   PT Start Time 9373   PT Stop Time 1115   PT Time Calculation (min) 60 min   Activity Tolerance Patient tolerated treatment well   Behavior During Therapy [facility] for tasks assessed/performed      Past Medical History  Diagnosis Date  . Rotator cuff injury   . Fatigue   . Edema   . Hyperlipidemia   . Depression   . Anxiety   . Syncope   . Chest pain     Normal cardiac cath 5/09  . Dumping syndrome   . Lumbar spondylosis   . Cervical spondylosis with myelopathy   . Hypertension   . Fibromyalgia   . PONV (postoperative nausea and vomiting)   . Restless leg syndrome   . Hypothyroidism   . Anemia   . Tremors of nervous system   . Arthritis   . Brain lesion     2 types  . Stroke     TIAs "mini strokes"- unsure of last TIA  . Lymphedema     seeing specialist for this    Past Surgical History  Procedure Laterality Date  . Rotator cuff repair Bilateral   . Total knee arthroplasty Left   . Appendectomy  1966  . Lumbar disc surgery      x2  . Total abdominal hysterectomy    . Total knee arthroplasty Right 11/18/2012    Procedure: TOTAL KNEE ARTHROPLASTY;  Surgeon: Yvette Rack., MD;  Location: [facility];  Service: Orthopedics;  Laterality: Right;  . Knee arthroscopy Bilateral   . Cholecystectomy  2015  . Hip bursa surgery Bilateral   . Lumbar fusion    . Coronary angiogram  2009    Normal coronaries  . Cholecystectomy  2015     There were no vitals filed for this visit.  Visit Diagnosis:  Lymphedema  Difficulty walking      Subjective Assessment - 04/24/15 1208    Subjective PT comes today with her short stretch bandages.  States garment fitter called with possiblity of delivery of garments today.  Pt reports increased fluid and swelling into LE's.    Currently in Pain? No/denies                         [facility] Adult PT Treatment/Exercise - 04/24/15 0001    Posture/Postural Control   Posture Comments 247   Manual Therapy   Manual Therapy Manual Lymphatic Drainage (MLD)   Manual therapy comments bandaging bilateral LE's using shrot stetch, 1/2" foam and cotton to increase compression.    Manual Lymphatic Drainage (MLD) For bilateral LE's routing lymph fluid via bilateral inguino-axillary anastomosis anteriorly. as well as posteriorly.  Posterior was done in sidelying position.                   PT Short Term Goals - 04/10/15 1207    PT  SHORT TERM GOAL #1   Title Pt to be I in skin care and care of bandages   Time 2   Period Weeks   Status On-going   PT SHORT TERM GOAL #2   Title Pt to verbalize precautions for decreasing risk of infection   Time 2   Period Weeks   Status Achieved   PT SHORT TERM GOAL #3   Title reduce volume by 30%   Time 4   Period Weeks   Status Achieved           PT Long Term Goals - 04/10/15 1207    PT LONG TERM GOAL #1   Title Reduce volume by 50%   Time 8   Period Weeks   Status Achieved   PT LONG TERM GOAL #2   Title Pt to verbalize understanding of the maintenance phase of tx   Time 8   Period Weeks   Status Achieved   PT LONG TERM GOAL #3   Title Pt to understand where and how often to be fitted for a compression garment   Time 8   Period Weeks   Status Achieved   PT LONG TERM GOAL #4   Title Pt to be able to lift LE onto the treatment table to allow ease of getting in and our of bed   Time 8   Period Weeks   Status  Achieved   PT LONG TERM GOAL #5   Title Pt to state she is able to walk for 10 minutes without difficulty ( pt limited to 10 minutes due to back issues)   Time 8   Period Weeks   Status On-going               Plan - 04/24/15 1212    Clinical Impression Statement Additional gain of 1.4# since last visit 2 days ago.  Unable to complete MLD today due to only one treatment time scheduled.  Completed bandaging only for bilateral LE's.  Pt reported comfort at end of session    PT Next Visit Plan Re-evaluate next session wtih expected discharge if garments received.         Problem List Patient Active Problem List   Diagnosis Date Noted  . Normal coronary arteries 2009 01/14/2015  . Obesity-BMI 40 01/14/2015  . Restless leg 01/14/2015  . Chest pain 01/14/2015  . Back pain 01/14/2015  . Renal insufficiency 01/14/2015  . Dementia- mild memory issues 01/14/2015  . Occult blood positive stool 12/14/2014  . Anemia 12/14/2014  . Family history of colon cancer 12/14/2014  . Change in bowel habits 12/14/2014  . GERD (gastroesophageal reflux disease) 12/14/2014  . Dyspnea 05/17/2012  . Lymphedema 05/17/2012  . Weight gain 05/17/2012  . HTN (hypertension) 05/17/2012    [name], PTA/CLT [phone]  04/24/2015, 12:15 PM  [facility] [address] [place], [place], [address] Phone: [phone]   Fax:  [phone]

## 2015-04-26 ENCOUNTER — Encounter (HOSPITAL_COMMUNITY): Payer: Medicare Other | Admitting: Physical Therapy

## 2015-04-26 ENCOUNTER — Ambulatory Visit (HOSPITAL_COMMUNITY): Payer: Medicare Other | Attending: Family Medicine | Admitting: Physical Therapy

## 2015-04-26 DIAGNOSIS — R262 Difficulty in walking, not elsewhere classified: Secondary | ICD-10-CM | POA: Diagnosis present

## 2015-04-26 DIAGNOSIS — I890 Lymphedema, not elsewhere classified: Secondary | ICD-10-CM | POA: Diagnosis present

## 2015-04-26 NOTE — Therapy (Signed)
[place], [place], [address] Phone: [phone]   Fax:  [phone]  Physical Therapy Lymphedema Re-evaluation Patient Details  Name: [name] MRN: [id number] Date of Birth: [birth year] Referring Provider:  Dione Housekeeper, MD  Encounter Date: 04/26/2015      PT End of Session - 04/26/15 1243    Visit Number 27   Number of Visits 27   Date for PT Re-Evaluation 04/25/15   Authorization Type medicare   Authorization - Visit Number 27   Authorization - Number of Visits 27   PT Start Time 1287   PT Stop Time 1020   PT Time Calculation (min) 85 min   Activity Tolerance Patient tolerated treatment well   Behavior During Therapy [facility] for tasks assessed/performed      Past Medical History  Diagnosis Date  . Rotator cuff injury   . Fatigue   . Edema   . Hyperlipidemia   . Depression   . Anxiety   . Syncope   . Chest pain     Normal cardiac cath 5/09  . Dumping syndrome   . Lumbar spondylosis   . Cervical spondylosis with myelopathy   . Hypertension   . Fibromyalgia   . PONV (postoperative nausea and vomiting)   . Restless leg syndrome   . Hypothyroidism   . Anemia   . Tremors of nervous system   . Arthritis   . Brain lesion     2 types  . Stroke     TIAs "mini strokes"- unsure of last TIA  . Lymphedema     seeing specialist for this    Past Surgical History  Procedure Laterality Date  . Rotator cuff repair Bilateral   . Total knee arthroplasty Left   . Appendectomy  1966  . Lumbar disc surgery      x2  . Total abdominal hysterectomy    . Total knee arthroplasty Right 11/18/2012    Procedure: TOTAL KNEE ARTHROPLASTY;  Surgeon: Yvette Rack., MD;  Location: [facility];  Service: Orthopedics;  Laterality: Right;  . Knee arthroscopy Bilateral   . Cholecystectomy  2015  . Hip bursa surgery Bilateral   . Lumbar fusion    . Coronary angiogram  2009    Normal coronaries  . Cholecystectomy   2015    There were no vitals filed for this visit.  Visit Diagnosis:  Lymphedema  Difficulty walking      Subjective Assessment - 04/26/15 1222    Subjective Pt states the garment fitter is meeting her here at therapy next wednesday.  Reports increased urination and reduction in fluid as compared to last week. PT without pain or difficulties.   Currently in Pain? No/denies           Date 02/14/2015  02/20/2015  02/20/2015  03/18/2015    Rt Lt Rt LT Rt Lt Rt Lt  MTP 23.00 23.00 23 23 22.8 22.8 22.5 22.5  ankle 34.2 38.3 33.60 34.40 34.20 36.20 34.00 35.50  4cm 42.50 37.30 33.00 33.00 33.00 35.70 35.30 34.50  8cm 45.50 43.10 41.00 40.50 38.50 36.00 41.00 42.00  12 cm 47.80 46.60 44.30 44.40 42.20 41.00 43.20 45.00  16cm 50.20 48.70 47.00 46.50 45.00 44.00 46.00 47.00  20cm 55.80 50.80 49.00 48.00 48.10 47.00 50.00 48.80  24cm 57.00 52.00 51.20 49.50 50.80 49.70 51.00 49.80  28cm 56.00 56.60 53.40 52.50 54.00 52.20 53.00 53.00  32cm 53.20 58.30 55.40 58.50 56.40 57.80  54.00 56.50  36cm 60.20 56.50 54.50 58.30 55.20 58.80 52.00 54.00  40cm 63.20 56.80 52.00 54.00 54.30 58.10 49.00 52.50  44cm 63.00 63.20 55.00 58.00 59.40 58.00 53.00 63.00  48cm 64.30 64.00 58.00 62.00 63.00 63.30 61.00 64.00                                                    Sum of squares 38430.67 36183.86 31605.86 33030.86 32550.67 32966.48 31032.58 33554.73  Total Volume 12232.87 11517.68 10060.46 10514.05 10361.2 10493.56 9877.981 10680.81      04/03/2015  04/22/2015  04/26/2015   Rt Lt Rt Lt  Rt Lt  22.6 22.5 22.6 22.1 22.2 22.4  32.00 33.80 31.50 24.30 31.40 32.30  32.00 32.00 32.50 39.70 32.50 31.50  38.00 38.00 41.20 42.00 37.80 36.50  40.50 40.50 44.80 44.50 40.30 39.50  43.50 43.00 48.70 47.00 44.40 42.00  47.00 45.00 51.20 49.40 46.50 44.00  49.20 47.50 54.80 52.50 48.50 47.00  52.20 53.50 56.70 57.00 51.00 51.70  54.50 55.50 53.50 58.20 53.00 55.50  49.00 52.00 51.00 53.10 50.00 51.00   51.50 55.50 56.40 55.30 51.00 51.80  57.00 61.00 59.80 61.70 55.50 56.50  60.00 62.00 61.80 62.10 58.20 61.00                                     29762.24 31178.94 33514.29 33982.09 29052.33 29289.43  9473.619 9924.568 10667.93 10816.84 9247.647 9323.118                  [facility] Adult PT Treatment/Exercise - 04/26/15 0001    Posture/Postural Control   Posture Comments 247   Manual Therapy   Manual Therapy Manual Lymphatic Drainage (MLD)   Manual therapy comments bandaging bilateral LE's using shrot stetch, 1/2" foam and cotton to increase compression.    Manual Lymphatic Drainage (MLD) For bilateral LE's routing lymph fluid via bilateral inguino-axillary anastomosis anteriorly. as well as posteriorly.  Posterior was done in sidelying position.                 PT Education - 04/26/15 1239    Education provided Yes   Education Details Instructed to wash and keep bandages.  Pt is to contact office to make appointment if unable to wear garments or appointment is cancelled.   Person(s) Educated Patient   Methods Explanation   Comprehension Verbalized understanding          PT Short Term Goals - 04/26/15 1309    PT SHORT TERM GOAL #1   Title Pt to be I in skin care and care of bandages   Time 2   Period Weeks   Status Achieved   PT SHORT TERM GOAL #2   Title Pt to verbalize precautions for decreasing risk of infection   Time 2   Period Weeks   Status Achieved   PT SHORT TERM GOAL #3   Title reduce volume by 30%   Time 4   Period Weeks   Status Achieved           PT Long Term Goals - 04/26/15 1310    PT LONG TERM GOAL #1   Title Reduce volume by 50%   Time 8   Period Weeks   Status Achieved   PT LONG TERM  GOAL #2   Title Pt to verbalize understanding of the maintenance phase of tx   Time 8   Period Weeks   Status Achieved   PT LONG TERM GOAL #3   Title Pt to understand where and how often to be fitted for a compression garment   Time 8    Period Weeks   Status Achieved   PT LONG TERM GOAL #4   Title Pt to be able to lift LE onto the treatment table to allow ease of getting in and our of bed   Time 8   Period Weeks   Status Achieved   PT LONG TERM GOAL #5   Title Pt to state she is able to walk for 10 minutes without difficulty ( pt limited to 10 minutes due to back issues)   Time 8   Period Weeks   Status On-going               Plan - 2015/04/30 1305    Clinical Impression Statement Pt remeasured today with reduction of 1,420.287 cc from [place] and 1,493.721 cc from LT LE since last weeks appointment.  Pt has been receiving lymphedema treatment since june 23.  She has lost a total of 2,985.22cc from [place] and 2,194.566 cc from [place] since this date.  Pt is pleased with progress and has functionally improved as well.  Pt is to receive her compression garments next week.  Pt has met all goals and has progressed to maintenance level at this point.  Pt is aggreeable to discharge from skilled therapy.    PT Next Visit Plan Discharge from skilled therapy to restorative phase of treatment.           G-Codes - April 30, 2015 1526    Functional Assessment Tool Used volume   Functional Limitation Other PT primary   Other PT Primary Goal Status (G5498) At least 20 percent but less than 40 percent impaired, limited or restricted   Other PT Primary Discharge Status [phone]) At least 20 percent but less than 40 percent impaired, limited or restricted      Problem List Patient Active Problem List   Diagnosis Date Noted  . Normal coronary arteries 2009 01/14/2015  . Obesity-BMI 40 01/14/2015  . Restless leg 01/14/2015  . Chest pain 01/14/2015  . Back pain 01/14/2015  . Renal insufficiency 01/14/2015  . Dementia- mild memory issues 01/14/2015  . Occult blood positive stool 12/14/2014  . Anemia 12/14/2014  . Family history of colon cancer 12/14/2014  . Change in bowel habits 12/14/2014  . GERD (gastroesophageal reflux  disease) 12/14/2014  . Dyspnea 05/17/2012  . Lymphedema 05/17/2012  . Weight gain 05/17/2012  . HTN (hypertension) 05/17/2012    [name], PTA/CLT [phone] 04-30-2015, 3:27 PM  [facility] [address] [place], [place], [address] Phone: [phone]   Fax:  [phone]   PHYSICAL THERAPY DISCHARGE SUMMARY  Visits from Start of Care:27  Current functional level related to goals / functional outcomes: Able to lift leg onto bed; able to walk easier and longer   Remaining deficits: Continued back pain affects ambulation ability   Education / Equipment: On lymphedema  Plan: Patient agrees to discharge.  Patient goals were met. Patient is being discharged due to meeting the stated rehab goals.  ?????        Rayetta Humphrey, [place] CLT [phone]

## 2015-05-02 ENCOUNTER — Ambulatory Visit (HOSPITAL_COMMUNITY)
Admission: RE | Admit: 2015-05-02 | Discharge: 2015-05-02 | Disposition: A | Discharge status: Home / Self Care | Payer: Medicare Other | Source: Ambulatory Visit | Attending: Nephrology | Admitting: Nephrology

## 2015-05-02 DIAGNOSIS — N183 Chronic kidney disease, stage 3 unspecified: Secondary | ICD-10-CM

## 2015-08-12 ENCOUNTER — Other Ambulatory Visit: Payer: Self-pay | Admitting: Orthopedic Surgery

## 2015-08-12 DIAGNOSIS — M79671 Pain in right foot: Secondary | ICD-10-CM

## 2015-08-23 ENCOUNTER — Other Ambulatory Visit: Payer: Medicare Other

## 2015-08-23 ENCOUNTER — Ambulatory Visit
Admission: RE | Admit: 2015-08-23 | Discharge: 2015-08-23 | Disposition: A | Discharge status: Home / Self Care | Payer: Medicare Other | Source: Ambulatory Visit | Attending: Orthopedic Surgery | Admitting: Orthopedic Surgery

## 2015-08-23 DIAGNOSIS — M79671 Pain in right foot: Secondary | ICD-10-CM

## 2015-08-23 MED ORDER — METHYLPREDNISOLONE ACETATE 40 MG/ML INJ SUSP (RADIOLOG: 120.0000 mg | Freq: Once | INTRAMUSCULAR | Status: DC

## 2015-08-23 MED ORDER — IOHEXOL 180 MG/ML  SOLN: 1.0000 mL | Freq: Once | INTRAMUSCULAR | Status: DC | PRN

## 2015-08-25 HISTORY — PX: OTHER SURGICAL HISTORY: SHX169

## 2015-12-13 ENCOUNTER — Other Ambulatory Visit: Payer: Self-pay | Admitting: Orthopedic Surgery

## 2015-12-13 DIAGNOSIS — M79671 Pain in right foot: Secondary | ICD-10-CM

## 2015-12-27 ENCOUNTER — Other Ambulatory Visit: Payer: Medicare Other

## 2016-01-01 ENCOUNTER — Ambulatory Visit (HOSPITAL_COMMUNITY): Payer: Medicare Other | Attending: Family Medicine | Admitting: Physical Therapy

## 2016-01-01 DIAGNOSIS — I890 Lymphedema, not elsewhere classified: Secondary | ICD-10-CM | POA: Insufficient documentation

## 2016-01-01 DIAGNOSIS — R262 Difficulty in walking, not elsewhere classified: Secondary | ICD-10-CM | POA: Diagnosis present

## 2016-01-01 NOTE — Therapy (Addendum)
[place], [place], [address] Phone: [phone]   Fax:  [phone]  Physical Therapy Evaluation  Patient Details  Name: KANDRA DISHONG MRN: [id number] Date of Birth: 02/07/1946 Referring Provider: Dione Housekeeper   Encounter Date: 01/01/2016      PT End of Session - 01/01/16 1548    Visit Number 1   Number of Visits 18   Date for PT Re-Evaluation 01/31/16   Authorization Type Medicare   Authorization - Visit Number 1   Authorization - Number of Visits 10   PT Start Time 1400   PT Stop Time [id number]   PT Time Calculation (min) 92 min   Activity Tolerance Patient tolerated treatment well   Behavior During Therapy [facility] for tasks assessed/performed      Past Medical History  Diagnosis Date  . Rotator cuff injury   . Fatigue   . Edema   . Hyperlipidemia   . Depression   . Anxiety   . Syncope   . Chest pain     Normal cardiac cath 5/09  . Dumping syndrome   . Lumbar spondylosis   . Cervical spondylosis with myelopathy   . Hypertension   . Fibromyalgia   . PONV (postoperative nausea and vomiting)   . Restless leg syndrome   . Hypothyroidism   . Anemia   . Tremors of nervous system   . Arthritis   . Brain lesion     2 types  . Stroke     TIAs "mini strokes"- unsure of last TIA  . Lymphedema     seeing specialist for this    Past Surgical History  Procedure Laterality Date  . Rotator cuff repair Bilateral   . Total knee arthroplasty Left   . Appendectomy  1966  . Lumbar disc surgery      x2  . Total abdominal hysterectomy    . Total knee arthroplasty Right 11/18/2012    Procedure: TOTAL KNEE ARTHROPLASTY;  Surgeon: Yvette Rack., MD;  Location: [place];  Service: Orthopedics;  Laterality: Right;  . Knee arthroscopy Bilateral   . Cholecystectomy  2015  . Hip bursa surgery Bilateral   . Lumbar fusion    . Coronary angiogram  2009    Normal coronaries  . Cholecystectomy  2015    There were  no vitals filed for this visit.       Subjective Assessment - 01/01/16 1407    Subjective Ms. Wehrli states that she has panty hose compression garments whicn she has not been able to get  on by herself therefore she has not been wearing them.   She has a compression  Pump.  She had been  using her pump twice a day and then  Donning  her knee high compression garment until her legs became so large that she could no longer don her knee high compression garment.  .  She states that her legs have increased in size again therefore her MD has sent her back to therapy.  Pt states that she has been seeing a home health thearpist for eight weeks.who has been working on her strength and walking but her last day was last week.  The therapist explained that her insurance would not pay for both the home health and outpatient therapy.  Ms. Mantz assured therapist that home health had discharged her.       Pertinent History Ms. Verge was diagnosed with Lymphedema 2014; recieved therapy  at this time with good results.  Pt was then seen from 01/2015 to 04/26/15 with good results.     How long can you sit comfortably? no problem   How long can you stand comfortably? able to stand with no UE assist for 6 minutes.    How long can you walk comfortably? Able was ambulating with a therapist and her walker for 5-10 minutes    Patient Stated Goals Pt wants her legs to be smaller to be able to fit into her compression hose easier.    Currently in Pain? No/denies            [facility] PT Assessment - 01/01/16 0001    Assessment   Medical Diagnosis Bilateral Lymphedema    Referring Provider Dione Housekeeper    Onset Date/Surgical Date 11/01/15   Hand Dominance Right   Next MD Visit 02/19/2016   Prior Therapy 2016    Precautions   Precautions None   Restrictions   Weight Bearing Restrictions No   Balance Screen   Has the patient fallen in the past 6 months No   Has the patient had a decrease in activity level because of a  fear of falling?  Yes   Is the patient reluctant to leave their home because of a fear of falling?  No   Home Ecologist residence   Home Access Stairs to enter   Entrance Stairs-Number of Steps 2   Entrance Stairs-Rails Right   Prior Function   Level of Independence Independent   Cognition   Overall Cognitive Status Within Functional Limits for tasks assessed   Observation/Other Assessments   Other Surveys  --  Lymphedema impact score 63/100           LYMPHEDEMA/ONCOLOGY QUESTIONNAIRE - 01/01/16 1448    Type   Cancer Type none   Date Lymphedema/Swelling Started   Date --  Pt has had lymphedema for 30 years but diagnosed since 2014.   Treatment   Active Chemotherapy Treatment No   Past Chemotherapy Treatment No   Active Radiation Treatment No   Past Radiation Treatment No   Current Hormone Treatment No   Past Hormone Therapy No   What other symptoms do you have   Are you Having Heaviness or Tightness Yes   Are you having Pain No   Are you having pitting edema Yes   Body Site ankles Bilaterally    Is it Hard or Difficult finding clothes that fit Yes   Do you have infections No   Is there Decreased scar mobility No   Stemmer Sign Yes   Other Symptoms induration    Lymphedema Stage   Stage STAGE 2 SPONTANEOUSLY IRREVERSIBLE   Lymphedema Assessments   Lymphedema Assessments Lower extremities   Right Lower Extremity Lymphedema   At Groin Measure at Horizontal from Pubic Bone 63.5 cm   30 cm Proximal to Suprapatella 84.4 cm   20 cm Proximal to Suprapatella 73.5 cm   10 cm Proximal to Suprapatella 65.8 cm   At Midpatella/Popliteal Crease 52.3 cm   30 cm Proximal to Floor at Lateral Plantar Foot 48.4 cm   20 cm Proximal to Floor at Lateral Plantar Foot 44.3 1   10  cm Proximal to Floor at Lateral Malleoli 37.7 cm   Circumference of ankle/heel 36.8 cm.   5 cm Proximal to 1st MTP Joint 24.3 cm   Across MTP Joint 23 cm   Around Proximal  Great Toe 8.5  cm   Other red   Left Lower Extremity Lymphedema   At Groin Measure at Horizontal from Pubic Bone 86.5 cm   30 cm Proximal to Suprapatella 87.5 cm   20 cm Proximal to Suprapatella 76.6 cm   10 cm Proximal to Suprapatella 68.8 cm   At Midpatella/Popliteal Crease 55.3 cm   30 cm Proximal to Floor at Lateral Plantar Foot 47.7 cm   20 cm Proximal to Floor at Lateral Plantar Foot 43.2 cm   10 cm Proximal to Floor at Lateral Malleoli 37 cm   Circumference of ankle/heel 35.5 cm.   5 cm Proximal to 1st MTP Joint 23.5 cm   Across MTP Joint 23 cm   Around Proximal Great Toe 8.3 cm   Other red                 [facility] Adult PT Treatment/Exercise - 01/01/16 0001    Manual Therapy   Manual Therapy Manual Lymphatic Drainage (MLD) Including supraclavicular, deep and superficial abdominal;  Anterior (anterior only due to time restraints), inguinal/axillary anastomosis followed by LE; done bilaterally.                 PT Education - 01/01/16 1547    Education provided Yes   Education Details LE exercise to promote lymphatic circulation, the importance of wearing compression garments every day    Person(s) Educated Patient;Child(ren)   Methods Explanation   Comprehension Verbalized understanding          PT Short Term Goals - 01/01/16 1611    PT SHORT TERM GOAL #1   Title Pt volume in both legs to be decreased by 2 cm in both thigh and calf for improved clothing fitting    Time 2   Period Weeks   Status New   PT SHORT TERM GOAL #2   Title Pt to verbalize precautions for decreasing risk of infection   Period Days   Status New   PT SHORT TERM GOAL #3   Title Pt to be able to lift both legs onto the bed with ease for improved independence in bed mobility    Time 3   Period Weeks   Status New           PT Long Term Goals - 01/01/16 1613    PT LONG TERM GOAL #1   Title Pt to have a volume reduction in both legs by 3 cm  in calf, and thigh for improved  clothing  fitting    Time 6   Period Weeks   Status New   PT LONG TERM GOAL #2   Title Pt to verbalize understanding of the maintenance phase of tx to prevent pt from having to return to skilled therapy    Time 6   Period Weeks   Status New   PT LONG TERM GOAL #3   Title Pt to be able to go up and down her  2 steps at home with handrail assist independently in a reciprocal manner    Time 6   Period Weeks   Status New   PT LONG TERM GOAL #4   Title Pt to be walking 10 minutes twice a day to promote lymphatic circulation    Time 6   Period Weeks               Plan - 01/01/16 1549    Clinical Impression Statement Ms. Atmore is a 70 yo female with medical history of HTN,  hyperlipidemia, chronic low back pain and  Lymphedema of B LE who has been referred to skilled physical therapy secondary to acute exacerbation of her lymphedema.  Upon questioning she was told to wear   pantyhose compression to encompass her thighs and abdominal area.  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 She was unable to "make it to the restroom", therefore, she quit wearing her compression.   Her legs progressively increased in edema.  She is now unable to get her knee high compression stockings on, therefore,  she has been referred to skilled physcial therapy for total decongestive techniques.   She states that she was told by [facility] that they now have a compression capri that she could wear with the compression knee high garment and that the capri is significantly easier to don and doff than the pantihose.  Examination shows significant increased edma in bilateral LE , both LE are red but do not have calor at this time.  Ms. Poirrier will benefit from skilled physical therapy for completed decongestive techniqus to decrease the  risk of cellulitis.     Rehab Potential Good   PT Frequency 3x / week   PT Duration 6 weeks   PT  Treatment/Interventions Manual techniques;Manual lymph drainage;Compression bandaging   PT Next Visit Plan Patient is to bring in her short stretch bandages.  Begin manual decongestive techniques on posterior aspect as well as anterior aspect and multilayer compression wrapping using foam and short stretch bandages.  Therapist has sent prescription for capri compression as well as Reidsleeve to MD.  Please forward to [facility] when it returns with signature.     PT Home Exercise Plan exercises    Consulted and Agree with Plan of Care Patient;Family member/caregiver   Family Member Consulted daughter       Patient will benefit from skilled therapeutic intervention in order to improve the following deficits and impairments:  Difficulty walking, Increased edema, Obesity, Decreased activity tolerance, Decreased endurance, Decreased strength  Visit Diagnosis: Lymphedema, not elsewhere classified  Difficulty in walking, not elsewhere classified      G-Codes - Jan 16, 2016 1616    Functional Limitation Other PT primary   Other PT Primary Current Status [id number]) At least 60 percent but less than 80 percent impaired, limited or restricted   Other PT Primary Goal Status [id number]) At least 40 percent but less than 60 percent impaired, limited or restricted       Problem List Patient Active Problem List   Diagnosis Date Noted  . Normal coronary arteries 2009 01/14/2015  . Obesity-BMI 40 01/14/2015  . Restless leg 01/14/2015  . Chest pain 01/14/2015  . Back pain 01/14/2015  . Renal insufficiency 01/14/2015  . Dementia- mild memory issues 01/14/2015  . Occult blood positive stool 12/14/2014  . Anemia 12/14/2014  . Family history of colon cancer 12/14/2014  . Change in bowel habits 12/14/2014  . GERD (gastroesophageal reflux disease) 12/14/2014  . Dyspnea 05/17/2012  . Lymphedema 05/17/2012  . Weight gain 05/17/2012  . HTN (hypertension) 05/17/2012  Rayetta Humphrey, PT  CLT [phone] 01/16/16, 4:17 PM  [place] [address] [place], [place], [address] Phone: [phone]   Fax:  [phone]  Name: [name] MRN: [id number] Date of Birth: 19-Oct-1945

## 2016-01-01 NOTE — Addendum Note (Signed)
Addended by: Leeroy Cha on: 01/01/2016 04:40 PM   Modules accepted: Orders

## 2016-01-06 ENCOUNTER — Ambulatory Visit (HOSPITAL_COMMUNITY): Payer: Medicare Other | Admitting: Physical Therapy

## 2016-01-06 DIAGNOSIS — I890 Lymphedema, not elsewhere classified: Secondary | ICD-10-CM | POA: Diagnosis not present

## 2016-01-06 DIAGNOSIS — R262 Difficulty in walking, not elsewhere classified: Secondary | ICD-10-CM

## 2016-01-06 NOTE — Therapy (Signed)
[place], [place], [address] Phone: [phone]   Fax:  [phone]  Physical Therapy Treatment  Patient Details  Name: [name] MRN: [id number] Date of Birth: November 08, 1945 Referring Provider: Dione Housekeeper   Encounter Date: 01/06/2016      PT End of Session - 01/06/16 1339    Visit Number 2   Number of Visits 18   Date for PT Re-Evaluation 01/31/16   Authorization Type Medicare   Authorization - Visit Number 2   Authorization - Number of Visits 10   PT Start Time 1115   PT Stop Time 1215   PT Time Calculation (min) 60 min   Activity Tolerance Patient tolerated treatment well   Behavior During Therapy [facility] for tasks assessed/performed      Past Medical History  Diagnosis Date  . Rotator cuff injury   . Fatigue   . Edema   . Hyperlipidemia   . Depression   . Anxiety   . Syncope   . Chest pain     Normal cardiac cath 5/09  . Dumping syndrome   . Lumbar spondylosis   . Cervical spondylosis with myelopathy   . Hypertension   . Fibromyalgia   . PONV (postoperative nausea and vomiting)   . Restless leg syndrome   . Hypothyroidism   . Anemia   . Tremors of nervous system   . Arthritis   . Brain lesion     2 types  . Stroke     TIAs "mini strokes"- unsure of last TIA  . Lymphedema     seeing specialist for this    Past Surgical History  Procedure Laterality Date  . Rotator cuff repair Bilateral   . Total knee arthroplasty Left   . Appendectomy  1966  . Lumbar disc surgery      x2  . Total abdominal hysterectomy    . Total knee arthroplasty Right 11/18/2012    Procedure: TOTAL KNEE ARTHROPLASTY;  Surgeon: Yvette Rack., MD;  Location: [place];  Service: Orthopedics;  Laterality: Right;  . Knee arthroscopy Bilateral   . Cholecystectomy  2015  . Hip bursa surgery Bilateral   . Lumbar fusion    . Coronary angiogram  2009    Normal coronaries  . Cholecystectomy  2015    There were  no vitals filed for this visit.      Subjective Assessment - 01/06/16 1335    Subjective Pt comes today with bandages.  States she spoke to [place] from [facility] and believes the newer capris will be easier for her to manage.  Her current capris are too hard to get up and down.  States she was wearing her knee highs some but wasnt enough.     Currently in Pain? No/denies                         [facility] Adult PT Treatment/Exercise - 01/06/16 0001    Manual Therapy   Manual Therapy Manual Lymphatic Drainage (MLD);Other (comment)   Manual therapy comments anteriorly only   Manual Lymphatic Drainage (MLD) mini massage including short neck, deep abdominal and inguinal axillary anastomosis bilaterally for LE's.   Other Manual Therapy bandaging bilateral LE's to knee level including multilayer short stretch and 1/2 inch foam                  PT Short Term Goals - 01/06/16 1352  PT SHORT TERM GOAL #1   Title Pt volume in both legs to be decreased by 2 cm in both thigh and calf for improved clothing fitting    Time 2   Period Weeks   Status On-going   PT SHORT TERM GOAL #2   Title Pt to verbalize precautions for decreasing risk of infection   Period Days   Status On-going   PT SHORT TERM GOAL #3   Title Pt to be able to lift both legs onto the bed with ease for improved independence in bed mobility    Time 3   Period Weeks   Status On-going           PT Long Term Goals - 01/06/16 1353    PT LONG TERM GOAL #1   Title Pt to have a volume reduction in both legs by 3 cm  in calf, and thigh for improved clothing  fitting    Time 6   Period Weeks   Status On-going   PT LONG TERM GOAL #2   Title Pt to verbalize understanding of the maintenance phase of tx to prevent pt from having to return to skilled therapy    Time 6   Period Weeks   Status On-going   PT LONG TERM GOAL #3   Title Pt to be able to go up and down her  2 steps at home with handrail  assist independently in a reciprocal manner    Time 6   Period Weeks   Status On-going   PT LONG TERM GOAL #4   Title Pt to be walking 10 minutes twice a day to promote lymphatic circulation    Time 6   Period Weeks   Status On-going               Plan - 01/06/16 1347    Clinical Impression Statement Pt only with 45 minute time slot this session.  Able to complete 20 minute MLD for bilateral LE"s and begin compression bandaging for bilateral LE's to knee height. Moisturized LE's well prior to bandaging.  Pt reported overall comfort at end of session.  Pt to follow up with [facility] to get measurements hopefully sometime next week as should decompress quickly.   Rehab Potential Good   PT Frequency 3x / week   PT Duration 6 weeks   PT Treatment/Interventions Manual techniques;Manual lymph drainage;Compression bandaging   PT Next Visit Plan Continue manual decongestive techniques on posterior aspect as well as anterior aspect and multilayer compression wrapping using foam and short stretch bandages with longer time slot from her on out.    Consulted and Agree with Plan of Care Patient;Family member/caregiver   Family Member Consulted daughter       Patient will benefit from skilled therapeutic intervention in order to improve the following deficits and impairments:  Difficulty walking, Increased edema, Obesity, Decreased activity tolerance, Decreased endurance, Decreased strength  Visit Diagnosis: Lymphedema, not elsewhere classified  Difficulty in walking, not elsewhere classified     Problem List Patient Active Problem List   Diagnosis Date Noted  . Normal coronary arteries 2009 01/14/2015  . Obesity-BMI 40 01/14/2015  . Restless leg 01/14/2015  . Chest pain 01/14/2015  . Back pain 01/14/2015  . Renal insufficiency 01/14/2015  . Dementia- mild memory issues 01/14/2015  . Occult blood positive stool 12/14/2014  . Anemia 12/14/2014  . Family history of colon  cancer 12/14/2014  . Change in bowel habits 12/14/2014  . GERD (  gastroesophageal reflux disease) 12/14/2014  . Dyspnea 05/17/2012  . Lymphedema 05/17/2012  . Weight gain 05/17/2012  . HTN (hypertension) 05/17/2012    [name], PTA/CLT [phone]  01/06/2016, 1:54 PM  [place] [address] [place], [place], [address] Phone: [phone]   Fax:  [phone]  Name: [name] MRN: [id number] Date of Birth: Jun 05, 1946

## 2016-01-08 ENCOUNTER — Ambulatory Visit (HOSPITAL_COMMUNITY): Payer: Medicare Other | Admitting: Physical Therapy

## 2016-01-08 DIAGNOSIS — I890 Lymphedema, not elsewhere classified: Secondary | ICD-10-CM | POA: Diagnosis not present

## 2016-01-08 DIAGNOSIS — R262 Difficulty in walking, not elsewhere classified: Secondary | ICD-10-CM

## 2016-01-08 NOTE — Therapy (Signed)
[place], [place], [address] Phone: [phone]   Fax:  [phone]  Physical Therapy Treatment  Patient Details  Name: [name] MRN: [id number] Date of Birth: 1945-12-04 Referring Provider: Dione Housekeeper   Encounter Date: 01/08/2016      PT End of Session - 01/08/16 1607    Visit Number 3   Number of Visits 18   Date for PT Re-Evaluation 01/31/16   Authorization Type Medicare   Authorization - Visit Number 3   Authorization - Number of Visits 10   PT Start Time 1400   PT Stop Time 1525   PT Time Calculation (min) 85 min   Activity Tolerance Patient tolerated treatment well   Behavior During Therapy [facility] for tasks assessed/performed      Past Medical History  Diagnosis Date  . Rotator cuff injury   . Fatigue   . Edema   . Hyperlipidemia   . Depression   . Anxiety   . Syncope   . Chest pain     Normal cardiac cath 5/09  . Dumping syndrome   . Lumbar spondylosis   . Cervical spondylosis with myelopathy   . Hypertension   . Fibromyalgia   . PONV (postoperative nausea and vomiting)   . Restless leg syndrome   . Hypothyroidism   . Anemia   . Tremors of nervous system   . Arthritis   . Brain lesion     2 types  . Stroke     TIAs "mini strokes"- unsure of last TIA  . Lymphedema     seeing specialist for this    Past Surgical History  Procedure Laterality Date  . Rotator cuff repair Bilateral   . Total knee arthroplasty Left   . Appendectomy  1966  . Lumbar disc surgery      x2  . Total abdominal hysterectomy    . Total knee arthroplasty Right 11/18/2012    Procedure: TOTAL KNEE ARTHROPLASTY;  Surgeon: Yvette Rack., MD;  Location: [place];  Service: Orthopedics;  Laterality: Right;  . Knee arthroscopy Bilateral   . Cholecystectomy  2015  . Hip bursa surgery Bilateral   . Lumbar fusion    . Coronary angiogram  2009    Normal coronaries  . Cholecystectomy  2015    There were  no vitals filed for this visit.      Subjective Assessment - 01/08/16 1604    Subjective PT reports the bandages felt great last visit.  Comes today wearing a skirt so we can begin full LE bandaging.  No pain reported.   Currently in Pain? No/denies                         [facility] Adult PT Treatment/Exercise - 01/08/16 0001    Manual Therapy   Manual Therapy Manual Lymphatic Drainage (MLD);Other (comment)   Manual therapy comments anteriorly only   Manual Lymphatic Drainage (MLD) short neck, deep abdominals with bilateral inguinal axillary anastomosis for bilateral LE's.   Other Manual Therapy full bandaging bilateral LE's to including multilayer short stretch and 1/2 inch foam                  PT Short Term Goals - 01/06/16 1352    PT SHORT TERM GOAL #1   Title Pt volume in both legs to be decreased by 2 cm in both thigh and calf for improved clothing fitting  Time 2   Period Weeks   Status On-going   PT SHORT TERM GOAL #2   Title Pt to verbalize precautions for decreasing risk of infection   Period Days   Status On-going   PT SHORT TERM GOAL #3   Title Pt to be able to lift both legs onto the bed with ease for improved independence in bed mobility    Time 3   Period Weeks   Status On-going           PT Long Term Goals - 01/06/16 1353    PT LONG TERM GOAL #1   Title Pt to have a volume reduction in both legs by 3 cm  in calf, and thigh for improved clothing  fitting    Time 6   Period Weeks   Status On-going   PT LONG TERM GOAL #2   Title Pt to verbalize understanding of the maintenance phase of tx to prevent pt from having to return to skilled therapy    Time 6   Period Weeks   Status On-going   PT LONG TERM GOAL #3   Title Pt to be able to go up and down her  2 steps at home with handrail assist independently in a reciprocal manner    Time 6   Period Weeks   Status On-going   PT LONG TERM GOAL #4   Title Pt to be walking 10 minutes  twice a day to promote lymphatic circulation    Time 6   Period Weeks   Status On-going               Plan - 01/08/16 1608    Clinical Impression Statement Full MLD completed this session as well as full bandaging for bilateral LE's.  PT reminded to remove bandages if increased discomfort or pain.  Moisturized LE's well prior to bandaging.  Immediate need to void following session.  Encouraged pateint to call [facility] as they have not reached out to her yet.     Rehab Potential Good   PT Frequency 3x / week   PT Duration 6 weeks   PT Treatment/Interventions Manual techniques;Manual lymph drainage;Compression bandaging   PT Next Visit Plan Continue with complete decongestive therapy including manual lymph drainage and appropriate bandaging.  Follow up with [facility] and give patient Brandy's number [phone]   Consulted and Agree with Plan of Care Patient;Family member/caregiver   Family Member Consulted daughter       Patient will benefit from skilled therapeutic intervention in order to improve the following deficits and impairments:  Difficulty walking, Increased edema, Obesity, Decreased activity tolerance, Decreased endurance, Decreased strength  Visit Diagnosis: Lymphedema, not elsewhere classified  Difficulty in walking, not elsewhere classified     Problem List Patient Active Problem List   Diagnosis Date Noted  . Normal coronary arteries 2009 01/14/2015  . Obesity-BMI 40 01/14/2015  . Restless leg 01/14/2015  . Chest pain 01/14/2015  . Back pain 01/14/2015  . Renal insufficiency 01/14/2015  . Dementia- mild memory issues 01/14/2015  . Occult blood positive stool 12/14/2014  . Anemia 12/14/2014  . Family history of colon cancer 12/14/2014  . Change in bowel habits 12/14/2014  . GERD (gastroesophageal reflux disease) 12/14/2014  . Dyspnea 05/17/2012  . Lymphedema 05/17/2012  . Weight gain 05/17/2012  . HTN (hypertension) 05/17/2012    [name], PTA/CLT [phone]  01/08/2016, 4:14 PM  [place]  [place], [place], [address] Phone: [phone]   Fax:  [phone]  Name: [name] MRN: [id number] Date of Birth: [birth year]

## 2016-01-10 ENCOUNTER — Ambulatory Visit (HOSPITAL_COMMUNITY): Payer: Medicare Other | Admitting: Physical Therapy

## 2016-01-10 DIAGNOSIS — I890 Lymphedema, not elsewhere classified: Secondary | ICD-10-CM | POA: Diagnosis not present

## 2016-01-10 DIAGNOSIS — R262 Difficulty in walking, not elsewhere classified: Secondary | ICD-10-CM

## 2016-01-10 NOTE — Therapy (Signed)
[place], [place], [address] Phone: [phone]   Fax:  [phone]  Physical Therapy Treatment  Patient Details  Name: [name] MRN: [id number] Date of Birth: 12/15/1945 Referring Provider: Dione Housekeeper   Encounter Date: 01/10/2016      PT End of Session - 01/10/16 1424    Visit Number 4   Number of Visits 18   Date for PT Re-Evaluation 01/31/16   Authorization Type Medicare   Authorization - Visit Number 4   Authorization - Number of Visits 10   PT Start Time 1302   PT Stop Time 1420   PT Time Calculation (min) 78 min   Activity Tolerance Patient tolerated treatment well   Behavior During Therapy [facility] for tasks assessed/performed      Past Medical History  Diagnosis Date  . Rotator cuff injury   . Fatigue   . Edema   . Hyperlipidemia   . Depression   . Anxiety   . Syncope   . Chest pain     Normal cardiac cath 5/09  . Dumping syndrome   . Lumbar spondylosis   . Cervical spondylosis with myelopathy   . Hypertension   . Fibromyalgia   . PONV (postoperative nausea and vomiting)   . Restless leg syndrome   . Hypothyroidism   . Anemia   . Tremors of nervous system   . Arthritis   . Brain lesion     2 types  . Stroke     TIAs "mini strokes"- unsure of last TIA  . Lymphedema     seeing specialist for this    Past Surgical History  Procedure Laterality Date  . Rotator cuff repair Bilateral   . Total knee arthroplasty Left   . Appendectomy  1966  . Lumbar disc surgery      x2  . Total abdominal hysterectomy    . Total knee arthroplasty Right 11/18/2012    Procedure: TOTAL KNEE ARTHROPLASTY;  Surgeon: Yvette Rack., MD;  Location: [facility];  Service: Orthopedics;  Laterality: Right;  . Knee arthroscopy Bilateral   . Cholecystectomy  2015  . Hip bursa surgery Bilateral   . Lumbar fusion    . Coronary angiogram  2009    Normal coronaries  . Cholecystectomy  2015    There were  no vitals filed for this visit.      Subjective Assessment - 01/10/16 1422    Subjective Pt state that she just got back from walking in the pool.  Pt states compression was comfortable.  Increased voiding after last session.   Currently in Pain? No/denies                         [facility] Adult PT Treatment/Exercise - 01/10/16 0001    Manual Therapy   Manual Therapy Manual Lymphatic Drainage (MLD);Other (comment)   Manual therapy comments anteriorly only   Manual Lymphatic Drainage (MLD) short neck, deep abdominals with bilateral inguinal axillary anastomosis for bilateral LE's.   Other Manual Therapy full bandaging bilateral LE's to including multilayer short stretch and 1/2 inch foam                  PT Short Term Goals - 01/10/16 1427    PT SHORT TERM GOAL #1   Title Pt volume in both legs to be decreased by 2 cm in both thigh and calf for improved clothing fitting  Time 2   Period Weeks   Status On-going   PT SHORT TERM GOAL #2   Title Pt to verbalize precautions for decreasing risk of infection   Time 2   Period Weeks   Status Achieved   PT SHORT TERM GOAL #3   Title Pt to be able to lift both legs onto the bed with ease for improved independence in bed mobility    Time 3   Period Weeks   Status On-going           PT Long Term Goals - 01/10/16 1427    PT LONG TERM GOAL #1   Title Pt to have a volume reduction in both legs by 3 cm  in calf, and thigh for improved clothing  fitting    Time 6   Period Weeks   Status On-going   PT LONG TERM GOAL #2   Title Pt to verbalize understanding of the maintenance phase of tx to prevent pt from having to return to skilled therapy    Time 6   Period Weeks   Status Achieved   PT LONG TERM GOAL #3   Title Pt to be able to go up and down her  2 steps at home with handrail assist independently in a reciprocal manner    Time 6   Period Weeks   Status On-going   PT LONG TERM GOAL #4   Title Pt to be  walking 10 minutes twice a day to promote lymphatic circulation    Time 6   Period Weeks   Status On-going   PT LONG TERM GOAL #5   Status Deferred  repeat of LTG 4               Plan - 01/10/16 1425    Clinical Impression Statement Pt tolerated full bandaging without problems.  Pt has returned to the pool to improve lymphatic circulation and overall activity tolerance.  Pt to meet with [facility] on Wednesday for capri measurments.    PT Frequency 3x / week   PT Duration 6 weeks   PT Treatment/Interventions Manual techniques;Manual lymph drainage;Compression bandaging   PT Next Visit Plan Continue with total decongestive therapy. Remeasure next visit.       Patient will benefit from skilled therapeutic intervention in order to improve the following deficits and impairments:  Difficulty walking, Increased edema, Obesity, Decreased activity tolerance, Decreased endurance, Decreased strength  Visit Diagnosis: Lymphedema, not elsewhere classified  Difficulty in walking, not elsewhere classified     Problem List Patient Active Problem List   Diagnosis Date Noted  . Normal coronary arteries 2009 01/14/2015  . Obesity-BMI 40 01/14/2015  . Restless leg 01/14/2015  . Chest pain 01/14/2015  . Back pain 01/14/2015  . Renal insufficiency 01/14/2015  . Dementia- mild memory issues 01/14/2015  . Occult blood positive stool 12/14/2014  . Anemia 12/14/2014  . Family history of colon cancer 12/14/2014  . Change in bowel habits 12/14/2014  . GERD (gastroesophageal reflux disease) 12/14/2014  . Dyspnea 05/17/2012  . Lymphedema 05/17/2012  . Weight gain 05/17/2012  . HTN (hypertension) 05/17/2012    Rayetta Humphrey, PT CLT [phone] 01/10/2016, 2:29 PM  [place] [address] [place], [place], [address] Phone: [phone]   Fax:  [phone]  Name: [name] MRN: [id number] Date of Birth: 1946/02/20

## 2016-01-13 ENCOUNTER — Ambulatory Visit (HOSPITAL_COMMUNITY): Payer: Medicare Other | Admitting: Physical Therapy

## 2016-01-13 DIAGNOSIS — I890 Lymphedema, not elsewhere classified: Secondary | ICD-10-CM

## 2016-01-13 DIAGNOSIS — R262 Difficulty in walking, not elsewhere classified: Secondary | ICD-10-CM

## 2016-01-13 NOTE — Therapy (Signed)
[place], [place], [address] Phone: [phone]   Fax:  [phone]  Physical Therapy Treatment  Patient Details  Name: [name] MRN: [id number] Date of Birth: August 21, 1946 Referring Provider: Dione Housekeeper   Encounter Date: 01/13/2016      PT End of Session - 01/13/16 0951    Visit Number 5   Number of Visits 18   Date for PT Re-Evaluation 01/31/16   Authorization Type Medicare   Authorization - Visit Number 5   Authorization - Number of Visits 10   PT Start Time 0820   PT Stop Time 0945   PT Time Calculation (min) 85 min   Activity Tolerance Patient tolerated treatment well      Past Medical History  Diagnosis Date  . Rotator cuff injury   . Fatigue   . Edema   . Hyperlipidemia   . Depression   . Anxiety   . Syncope   . Chest pain     Normal cardiac cath 5/09  . Dumping syndrome   . Lumbar spondylosis   . Cervical spondylosis with myelopathy   . Hypertension   . Fibromyalgia   . PONV (postoperative nausea and vomiting)   . Restless leg syndrome   . Hypothyroidism   . Anemia   . Tremors of nervous system   . Arthritis   . Brain lesion     2 types  . Stroke     TIAs "mini strokes"- unsure of last TIA  . Lymphedema     seeing specialist for this    Past Surgical History  Procedure Laterality Date  . Rotator cuff repair Bilateral   . Total knee arthroplasty Left   . Appendectomy  1966  . Lumbar disc surgery      x2  . Total abdominal hysterectomy    . Total knee arthroplasty Right 11/18/2012    Procedure: TOTAL KNEE ARTHROPLASTY;  Surgeon: Yvette Rack., MD;  Location: [facility];  Service: Orthopedics;  Laterality: Right;  . Knee arthroscopy Bilateral   . Cholecystectomy  2015  . Hip bursa surgery Bilateral   . Lumbar fusion    . Coronary angiogram  2009    Normal coronaries  . Cholecystectomy  2015    There were no vitals filed for this visit.      Subjective  Assessment - 01/13/16 0949    Subjective Pt states that her compression wraps fell down over the weekend.  Pt continues to void    Pertinent History [name] was diagnosed with Lymphedema 2014; recieved therapy at this time with good results.  Pt was then seen from 01/2015 to 04/26/15 with good results.     Currently in Pain? No/denies               LYMPHEDEMA/ONCOLOGY QUESTIONNAIRE - 01/13/16 0823    Right Lower Extremity Lymphedema   30 cm Proximal to Suprapatella (p) 81 cm   20 cm Proximal to Suprapatella (p) 69 cm   10 cm Proximal to Suprapatella (p) 64 cm   At Midpatella/Popliteal Crease (p) 52 cm   30 cm Proximal to Floor at Lateral Plantar Foot (p) 48.5 cm   20 cm Proximal to Floor at Lateral Plantar Foot (p) 41 1   10 cm Proximal to Floor at Lateral Malleoli (p) 35.7 cm   Circumference of ankle/heel (p) 34.8 cm.   5 cm Proximal to 1st MTP Joint (p) 23.3 cm  Across MTP Joint (p) 23 cm   Around Proximal Great Toe (p) 8 cm   Left Lower Extremity Lymphedema   30 cm Proximal to Suprapatella (p) 84 cm   20 cm Proximal to Suprapatella (p) 72.5 cm   10 cm Proximal to Suprapatella (p) 66.2 cm   At Midpatella/Popliteal Crease (p) 56 cm   30 cm Proximal to Floor at Lateral Plantar Foot (p) 46.3 cm   20 cm Proximal to Floor at Lateral Plantar Foot (p) 42.8 cm   10 cm Proximal to Floor at Lateral Malleoli (p) 36 cm   Circumference of ankle/heel (p) 34.6 cm.   5 cm Proximal to 1st MTP Joint (p) 22.8 cm   Across MTP Joint (p) 23 cm   Around Proximal Great Toe (p) 7.8 cm                  [facility] Adult PT Treatment/Exercise - 01/13/16 0001    Manual Therapy   Manual Therapy Manual Lymphatic Drainage (MLD);Other (comment)   Manual therapy comments anteriorly only   Manual Lymphatic Drainage (MLD) short neck, deep abdominals with bilateral inguinal axillary anastomosis for bilateral LE's.   Other Manual Therapy full bandaging bilateral LE's to including multilayer short  stretch and 1/2 inch foam                  PT Short Term Goals - 01/10/16 1427    PT SHORT TERM GOAL #1   Title Pt volume in both legs to be decreased by 2 cm in both thigh and calf for improved clothing fitting    Time 2   Period Weeks   Status On-going   PT SHORT TERM GOAL #2   Title Pt to verbalize precautions for decreasing risk of infection   Time 2   Period Weeks   Status Achieved   PT SHORT TERM GOAL #3   Title Pt to be able to lift both legs onto the bed with ease for improved independence in bed mobility    Time 3   Period Weeks   Status On-going           PT Long Term Goals - 01/10/16 1427    PT LONG TERM GOAL #1   Title Pt to have a volume reduction in both legs by 3 cm  in calf, and thigh for improved clothing  fitting    Time 6   Period Weeks   Status On-going   PT LONG TERM GOAL #2   Title Pt to verbalize understanding of the maintenance phase of tx to prevent pt from having to return to skilled therapy    Time 6   Period Weeks   Status Achieved   PT LONG TERM GOAL #3   Title Pt to be able to go up and down her  2 steps at home with handrail assist independently in a reciprocal manner    Time 6   Period Weeks   Status On-going   PT LONG TERM GOAL #4   Title Pt to be walking 10 minutes twice a day to promote lymphatic circulation    Time 6   Period Weeks   Status On-going   PT LONG TERM GOAL #5   Status Deferred  repeat of LTG 4               Plan - 01/13/16 0951    Clinical Impression Statement Pt remeasured with significant decreased volumes.  Pt volume at ankles B  remain significantly congested with increased manual completed at this area.    PT Next Visit Plan Continue with total decongestive therapy      Patient will benefit from skilled therapeutic intervention in order to improve the following deficits and impairments:     Visit Diagnosis: Lymphedema, not elsewhere classified  Difficulty in walking, not elsewhere  classified     Problem List Patient Active Problem List   Diagnosis Date Noted  . Normal coronary arteries 2009 01/14/2015  . Obesity-BMI 40 01/14/2015  . Restless leg 01/14/2015  . Chest pain 01/14/2015  . Back pain 01/14/2015  . Renal insufficiency 01/14/2015  . Dementia- mild memory issues 01/14/2015  . Occult blood positive stool 12/14/2014  . Anemia 12/14/2014  . Family history of colon cancer 12/14/2014  . Change in bowel habits 12/14/2014  . GERD (gastroesophageal reflux disease) 12/14/2014  . Dyspnea 05/17/2012  . Lymphedema 05/17/2012  . Weight gain 05/17/2012  . HTN (hypertension) 05/17/2012    Rayetta Humphrey, PT CLT [phone] 01/13/2016, 9:53 AM  [place], [place], [address] Phone: [phone]   Fax:  [phone]  Name: [name] MRN: [id number] Date of Birth: [birth year]

## 2016-01-15 ENCOUNTER — Ambulatory Visit (HOSPITAL_COMMUNITY): Payer: Medicare Other | Admitting: Physical Therapy

## 2016-01-15 DIAGNOSIS — I890 Lymphedema, not elsewhere classified: Secondary | ICD-10-CM | POA: Diagnosis not present

## 2016-01-15 DIAGNOSIS — R262 Difficulty in walking, not elsewhere classified: Secondary | ICD-10-CM

## 2016-01-15 NOTE — Therapy (Signed)
[place], [place], [address] Phone: [phone]   Fax:  [phone]  Physical Therapy Treatment  Patient Details  Name: [name] MRN: [id number] Date of Birth: Nov 02, 1945 Referring Provider: Dione Housekeeper   Encounter Date: 01/15/2016      PT End of Session - 01/15/16 1601    Visit Number 6   Number of Visits 18   Date for PT Re-Evaluation 01/31/16   Authorization Type Medicare   Authorization - Visit Number 6   Authorization - Number of Visits 10   PT Start Time [id number]   PT Stop Time 1600   PT Time Calculation (min) 85 min      Past Medical History  Diagnosis Date  . Rotator cuff injury   . Fatigue   . Edema   . Hyperlipidemia   . Depression   . Anxiety   . Syncope   . Chest pain     Normal cardiac cath 5/09  . Dumping syndrome   . Lumbar spondylosis   . Cervical spondylosis with myelopathy   . Hypertension   . Fibromyalgia   . PONV (postoperative nausea and vomiting)   . Restless leg syndrome   . Hypothyroidism   . Anemia   . Tremors of nervous system   . Arthritis   . Brain lesion     2 types  . Stroke     TIAs "mini strokes"- unsure of last TIA  . Lymphedema     seeing specialist for this    Past Surgical History  Procedure Laterality Date  . Rotator cuff repair Bilateral   . Total knee arthroplasty Left   . Appendectomy  1966  . Lumbar disc surgery      x2  . Total abdominal hysterectomy    . Total knee arthroplasty Right 11/18/2012    Procedure: TOTAL KNEE ARTHROPLASTY;  Surgeon: Yvette Rack., MD;  Location: [facility];  Service: Orthopedics;  Laterality: Right;  . Knee arthroscopy Bilateral   . Cholecystectomy  2015  . Hip bursa surgery Bilateral   . Lumbar fusion    . Coronary angiogram  2009    Normal coronaries  . Cholecystectomy  2015    There were no vitals filed for this visit.      Subjective Assessment - 01/15/16 1600    Subjective Pt states that her  compression bandages held up.  Pt just went to the pool for an hour and a half    Pertinent History Ms. Zhong was diagnosed with Lymphedema 2014; recieved therapy at this time with good results.  Pt was then seen from 01/2015 to 04/26/15 with good results.     Currently in Pain? No/denies                         [facility] Adult PT Treatment/Exercise - 01/15/16 0001    Manual Therapy   Manual Therapy Manual Lymphatic Drainage (MLD);Other (comment)   Manual therapy comments anteriorly only   Manual Lymphatic Drainage (MLD) short neck, deep abdominals with bilateral inguinal axillary anastomosis for bilateral LE's.   Other Manual Therapy full bandaging bilateral LE's to including multilayer short stretch and 1/2 inch foam                  PT Short Term Goals - 01/10/16 1427    PT SHORT TERM GOAL #1   Title Pt volume in both legs to be  decreased by 2 cm in both thigh and calf for improved clothing fitting    Time 2   Period Weeks   Status On-going   PT SHORT TERM GOAL #2   Title Pt to verbalize precautions for decreasing risk of infection   Time 2   Period Weeks   Status Achieved   PT SHORT TERM GOAL #3   Title Pt to be able to lift both legs onto the bed with ease for improved independence in bed mobility    Time 3   Period Weeks   Status On-going           PT Long Term Goals - 01/10/16 1427    PT LONG TERM GOAL #1   Title Pt to have a volume reduction in both legs by 3 cm  in calf, and thigh for improved clothing  fitting    Time 6   Period Weeks   Status On-going   PT LONG TERM GOAL #2   Title Pt to verbalize understanding of the maintenance phase of tx to prevent pt from having to return to skilled therapy    Time 6   Period Weeks   Status Achieved   PT LONG TERM GOAL #3   Title Pt to be able to go up and down her  2 steps at home with handrail assist independently in a reciprocal manner    Time 6   Period Weeks   Status On-going   PT LONG TERM  GOAL #4   Title Pt to be walking 10 minutes twice a day to promote lymphatic circulation    Time 6   Period Weeks   Status On-going   PT LONG TERM GOAL #5   Status Deferred  repeat of LTG 4               Plan - 01/15/16 1602    Clinical Impression Statement Pt has increased edema at ankles but decreased induration palpated with manual.  Pt verbalizes compliance with Le execises to increase lymphatic circulation.  Pt measured for compression capri's today.   Pt continues to decongest and will benefit from skilled PT until maximum decongestion has been obtained.    Rehab Potential Good   PT Treatment/Interventions Manual techniques;Manual lymph drainage;Compression bandaging   PT Next Visit Plan Continue with total decongestive therapy      Patient will benefit from skilled therapeutic intervention in order to improve the following deficits and impairments:  Difficulty walking, Increased edema, Obesity, Decreased activity tolerance, Decreased endurance, Decreased strength  Visit Diagnosis: Lymphedema, not elsewhere classified  Difficulty in walking, not elsewhere classified  Lymphedema  Difficulty walking     Problem List Patient Active Problem List   Diagnosis Date Noted  . Normal coronary arteries 2009 01/14/2015  . Obesity-BMI 40 01/14/2015  . Restless leg 01/14/2015  . Chest pain 01/14/2015  . Back pain 01/14/2015  . Renal insufficiency 01/14/2015  . Dementia- mild memory issues 01/14/2015  . Occult blood positive stool 12/14/2014  . Anemia 12/14/2014  . Family history of colon cancer 12/14/2014  . Change in bowel habits 12/14/2014  . GERD (gastroesophageal reflux disease) 12/14/2014  . Dyspnea 05/17/2012  . Lymphedema 05/17/2012  . Weight gain 05/17/2012  . HTN (hypertension) 05/17/2012    Rayetta Humphrey, PT CLT [phone] 01/15/2016, 4:06 PM  [facility] [address] [place], [place], [address] Phone:  [phone]   Fax:  [phone]  Name: [name] MRN:  [id number] Date of Birth: [birth year]

## 2016-01-16 ENCOUNTER — Telehealth (HOSPITAL_COMMUNITY): Payer: Self-pay

## 2016-01-16 NOTE — Telephone Encounter (Signed)
01/16/16 patient said that her daughter can't bring her next Tuesday and Thursday.  I looked on schedule for Wed. And Fri. And nothing available right now.  She said that she was going to talk to [facility] about it.

## 2016-01-17 ENCOUNTER — Ambulatory Visit (HOSPITAL_COMMUNITY): Payer: Medicare Other | Admitting: Physical Therapy

## 2016-01-17 DIAGNOSIS — R262 Difficulty in walking, not elsewhere classified: Secondary | ICD-10-CM

## 2016-01-17 DIAGNOSIS — I890 Lymphedema, not elsewhere classified: Secondary | ICD-10-CM

## 2016-01-17 NOTE — Therapy (Signed)
[name], [place], [address] Phone: [phone]   Fax:  [phone]  Physical Therapy Treatment  Patient Details  Name: [name] MRN: [id number] Date of Birth: 08-Feb-1946 Referring Provider: Dione Housekeeper   Encounter Date: 01/17/2016      PT End of Session - 01/17/16 1031    Visit Number 7   Number of Visits 18   Date for PT Re-Evaluation 01/31/16   Authorization Type Medicare   Authorization - Visit Number 7   Authorization - Number of Visits 10   PT Start Time 0815   PT Stop Time 0947   PT Time Calculation (min) 92 min   Activity Tolerance Patient tolerated treatment well      Past Medical History  Diagnosis Date  . Rotator cuff injury   . Fatigue   . Edema   . Hyperlipidemia   . Depression   . Anxiety   . Syncope   . Chest pain     Normal cardiac cath 5/09  . Dumping syndrome   . Lumbar spondylosis   . Cervical spondylosis with myelopathy   . Hypertension   . Fibromyalgia   . PONV (postoperative nausea and vomiting)   . Restless leg syndrome   . Hypothyroidism   . Anemia   . Tremors of nervous system   . Arthritis   . Brain lesion     2 types  . Stroke     TIAs "mini strokes"- unsure of last TIA  . Lymphedema     seeing specialist for this    Past Surgical History  Procedure Laterality Date  . Rotator cuff repair Bilateral   . Total knee arthroplasty Left   . Appendectomy  1966  . Lumbar disc surgery      x2  . Total abdominal hysterectomy    . Total knee arthroplasty Right 11/18/2012    Procedure: TOTAL KNEE ARTHROPLASTY;  Surgeon: Yvette Rack., MD;  Location: [facility];  Service: Orthopedics;  Laterality: Right;  . Knee arthroscopy Bilateral   . Cholecystectomy  2015  . Hip bursa surgery Bilateral   . Lumbar fusion    . Coronary angiogram  2009    Normal coronaries  . Cholecystectomy  2015    There were no vitals filed for this visit.      Subjective  Assessment - 01/17/16 1030    Subjective Pt states that she can tell her legs are getting smaller.  Pt to get her capri's next week but will not be able to come to treatment next week due to not having transportation.    Pertinent History [name] was diagnosed with Lymphedema 2014; recieved therapy at this time with good results.  Pt was then seen from 01/2015 to 04/26/15 with good results.     Currently in Pain? No/denies                         [facility] Adult PT Treatment/Exercise - 01/17/16 0001    Manual Therapy   Manual Therapy Manual Lymphatic Drainage (MLD);Other (comment)   Manual therapy comments anteriorly only   Manual Lymphatic Drainage (MLD) short neck, deep abdominals with bilateral inguinal axillary anastomosis for bilateral LE's.   Other Manual Therapy full bandaging bilateral LE's to including multilayer short stretch and 1/2 inch foam                  PT Short Term  Goals - 01/10/16 1427    PT SHORT TERM GOAL #1   Title Pt volume in both legs to be decreased by 2 cm in both thigh and calf for improved clothing fitting    Time 2   Period Weeks   Status On-going   PT SHORT TERM GOAL #2   Title Pt to verbalize precautions for decreasing risk of infection   Time 2   Period Weeks   Status Achieved   PT SHORT TERM GOAL #3   Title Pt to be able to lift both legs onto the bed with ease for improved independence in bed mobility    Time 3   Period Weeks   Status On-going           PT Long Term Goals - 01/10/16 1427    PT LONG TERM GOAL #1   Title Pt to have a volume reduction in both legs by 3 cm  in calf, and thigh for improved clothing  fitting    Time 6   Period Weeks   Status On-going   PT LONG TERM GOAL #2   Title Pt to verbalize understanding of the maintenance phase of tx to prevent pt from having to return to skilled therapy    Time 6   Period Weeks   Status Achieved   PT LONG TERM GOAL #3   Title Pt to be able to go up and down  her  2 steps at home with handrail assist independently in a reciprocal manner    Time 6   Period Weeks   Status On-going   PT LONG TERM GOAL #4   Title Pt to be walking 10 minutes twice a day to promote lymphatic circulation    Time 6   Period Weeks   Status On-going   PT LONG TERM GOAL #5   Status Deferred  repeat of LTG 4               Plan - 01/17/16 1032    Clinical Impression Statement Increased time with manual spent at ankles and LE. Pt continues to decongest voicing increased voiding.    PT Next Visit Plan Continue with total decongestive therapy      Patient will benefit from skilled therapeutic intervention in order to improve the following deficits and impairments:     Visit Diagnosis: Lymphedema, not elsewhere classified  Difficulty in walking, not elsewhere classified  Lymphedema  Difficulty walking     Problem List Patient Active Problem List   Diagnosis Date Noted  . Normal coronary arteries 2009 01/14/2015  . Obesity-BMI 40 01/14/2015  . Restless leg 01/14/2015  . Chest pain 01/14/2015  . Back pain 01/14/2015  . Renal insufficiency 01/14/2015  . Dementia- mild memory issues 01/14/2015  . Occult blood positive stool 12/14/2014  . Anemia 12/14/2014  . Family history of colon cancer 12/14/2014  . Change in bowel habits 12/14/2014  . GERD (gastroesophageal reflux disease) 12/14/2014  . Dyspnea 05/17/2012  . Lymphedema 05/17/2012  . Weight gain 05/17/2012  . HTN (hypertension) 05/17/2012  Rayetta Humphrey, PT CLT [phone] 01/17/2016, 10:33 AM  [place], [place], [address] Phone: [phone]   Fax:  [phone]  Name: [name] MRN: [id number] Date of Birth: [birth year]

## 2016-01-21 ENCOUNTER — Encounter (HOSPITAL_COMMUNITY): Payer: Medicare Other | Admitting: Physical Therapy

## 2016-01-23 ENCOUNTER — Encounter (HOSPITAL_COMMUNITY): Payer: Medicare Other | Admitting: Physical Therapy

## 2016-01-27 ENCOUNTER — Ambulatory Visit (HOSPITAL_COMMUNITY): Payer: Medicare Other | Attending: Family Medicine | Admitting: Physical Therapy

## 2016-01-27 DIAGNOSIS — R262 Difficulty in walking, not elsewhere classified: Secondary | ICD-10-CM | POA: Diagnosis present

## 2016-01-27 DIAGNOSIS — I890 Lymphedema, not elsewhere classified: Secondary | ICD-10-CM | POA: Diagnosis present

## 2016-01-27 NOTE — Therapy (Signed)
[place], [place], [address] Phone: [phone]   Fax:  [phone]  Physical Therapy Treatment  Patient Details  Name: ILIANA HUTT MRN: [id number] Date of Birth: [birth year] Referring Provider: Dione Housekeeper   Encounter Date: 01/27/2016      PT End of Session - 01/27/16 1721    Visit Number 8   Number of Visits 18   Date for PT Re-Evaluation 01/31/16   Authorization Type Medicare   Authorization - Visit Number 8   Authorization - Number of Visits 10   PT Start Time 1600   PT Stop Time 3151   PT Time Calculation (min) 75 min   Activity Tolerance Patient tolerated treatment well   Behavior During Therapy [facility] for tasks assessed/performed      Past Medical History  Diagnosis Date  . Rotator cuff injury   . Fatigue   . Edema   . Hyperlipidemia   . Depression   . Anxiety   . Syncope   . Chest pain     Normal cardiac cath 5/09  . Dumping syndrome   . Lumbar spondylosis   . Cervical spondylosis with myelopathy   . Hypertension   . Fibromyalgia   . PONV (postoperative nausea and vomiting)   . Restless leg syndrome   . Hypothyroidism   . Anemia   . Tremors of nervous system   . Arthritis   . Brain lesion     2 types  . Stroke     TIAs "mini strokes"- unsure of last TIA  . Lymphedema     seeing specialist for this    Past Surgical History  Procedure Laterality Date  . Rotator cuff repair Bilateral   . Total knee arthroplasty Left   . Appendectomy  1966  . Lumbar disc surgery      x2  . Total abdominal hysterectomy    . Total knee arthroplasty Right 11/18/2012    Procedure: TOTAL KNEE ARTHROPLASTY;  Surgeon: Yvette Rack., MD;  Location: [place];  Service: Orthopedics;  Laterality: Right;  . Knee arthroscopy Bilateral   . Cholecystectomy  2015  . Hip bursa surgery Bilateral   . Lumbar fusion    . Coronary angiogram  2009    Normal coronaries  . Cholecystectomy  2015    There were  no vitals filed for this visit.      Subjective Assessment - 01/27/16 1718    Subjective Pt states she loves her capris.  States she is able to get them on independently and her legs are much smaller now.  Pt just went to the pool for aquatic exercise prior to today's appt.  Pt states she gets an MRI tomorrow of her Rt hip to rulle out hip fracture.  Pt reports she is unsure what happened as she did not fall, however her rt hip has been hurting alot with weight bearing.  Pt is using a RW.   Currently in Pain? No/denies               LYMPHEDEMA/ONCOLOGY QUESTIONNAIRE - 01/27/16 1611    Right Lower Extremity Lymphedema   30 cm Proximal to Suprapatella 84 cm  was 81, 84.4   20 cm Proximal to Suprapatella 74.5 cm  was 69, 73.5   10 cm Proximal to Suprapatella 63 cm  was 64, 65.8   At Midpatella/Popliteal Crease 49.5 cm  was 52, 52.3   30 cm Proximal to Floor at  Lateral Plantar Foot 51.5 cm  was 48.5, 48.4   20 cm Proximal to Floor at Lateral Plantar Foot 42.8 1  was 41, 44.3   10 cm Proximal to Floor at Lateral Malleoli 32.5 cm  was 35.7, 37.7   Circumference of ankle/heel 34.8 cm.  was 34.8, 36.8   5 cm Proximal to 1st MTP Joint 24 cm  was 23.3, 24.3   Across MTP Joint 22.4 cm  was 23, 23   Around Proximal Great Toe 7.2 cm  was 8, 8.5   Left Lower Extremity Lymphedema   30 cm Proximal to Suprapatella 88 cm  was 84, 87.5   20 cm Proximal to Suprapatella 78 cm  was 72.5,76.6   10 cm Proximal to Suprapatella 66.2 cm  was 66.2, 68.8   At Midpatella/Popliteal Crease 55 cm  was 56, 55.3   30 cm Proximal to Floor at Lateral Plantar Foot 46.8 cm  was 46.3, 47.7   20 cm Proximal to Floor at Lateral Plantar Foot 40.5 cm  was 42.8, 43.2   10 cm Proximal to Floor at Lateral Malleoli 32.4 cm  was 36, 37   Circumference of ankle/heel 36 cm.  was 34.6, 35.5   5 cm Proximal to 1st MTP Joint 23.3 cm  was 22.8, 23.5   Across MTP Joint 22.5 cm  was 23, 23   Around Proximal  Great Toe 7.5 cm  was 7.8, 8.3                  [facility] Adult PT Treatment/Exercise - 01/27/16 1720    Manual Therapy   Manual Therapy Manual Lymphatic Drainage (MLD);Other (comment)   Manual therapy comments anteriorly only   Manual Lymphatic Drainage (MLD) short neck, deep abdominals with bilateral inguinal axillary anastomosis for bilateral LE's.   Other Manual Therapy full bandaging bilateral LE's to including multilayer short stretch and 1/2 inch foam                  PT Short Term Goals - 01/27/16 1726    PT SHORT TERM GOAL #1   Title Pt volume in both legs to be decreased by 2 cm in both thigh and calf for improved clothing fitting    Time 2   Period Weeks   Status Achieved   PT SHORT TERM GOAL #2   Title Pt to verbalize precautions for decreasing risk of infection   Time 2   Period Weeks   Status Achieved   PT SHORT TERM GOAL #3   Title Pt to be able to lift both legs onto the bed with ease for improved independence in bed mobility    Time 3   Period Weeks   Status Achieved           PT Long Term Goals - 01/27/16 1726    PT LONG TERM GOAL #1   Title Pt to have a volume reduction in both legs by 3 cm  in calf, and thigh for improved clothing  fitting    Time 6   Period Weeks   Status Partially Met   PT LONG TERM GOAL #2   Title Pt to verbalize understanding of the maintenance phase of tx to prevent pt from having to return to skilled therapy    Time 6   Period Weeks   Status Achieved   PT LONG TERM GOAL #3   Title Pt to be able to go up and down her  2 steps at home  with handrail assist independently in a reciprocal manner    Time 6   Period Weeks   Status Partially Met  can achieve but not reciprocally   PT LONG TERM GOAL #4   Title Pt to be walking 10 minutes twice a day to promote lymphatic circulation    Time 6   Period Weeks   Status Not Met   PT LONG TERM GOAL #5   Title Pt to state she is able to walk for 10 minutes without  difficulty ( pt limited to 10 minutes due to back issues)   Status Deferred  repeat of LTG 4               Plan - 01/27/16 1721    Clinical Impression Statement Bilateral LE's remeasured today.  Pt with varied measurements with decrease in foot and ankle regions, some increase around proximal LE and hip. Gains and losses, on a average are even as to no significant change.  Pt has had garments off all day due to going the the [facility] and having to come here afterward.  Pt also has neen sitting for the past 2 hours.  Pt reports overall comfort of garments and is able to don/doff indenpendently.  Pt instructed to wear garments daily and continue use of her pumps.  Pt feels she is ready for discharge at this time.    PT Next Visit Plan discharge to maintenance phase.  Pt instructed to call clinic with any questions.      Patient will benefit from skilled therapeutic intervention in order to improve the following deficits and impairments:     Visit Diagnosis: Lymphedema, not elsewhere classified  Difficulty in walking, not elsewhere classified     Problem List Patient Active Problem List   Diagnosis Date Noted  . Normal coronary arteries 2009 01/14/2015  . Obesity-BMI 40 01/14/2015  . Restless leg 01/14/2015  . Chest pain 01/14/2015  . Back pain 01/14/2015  . Renal insufficiency 01/14/2015  . Dementia- mild memory issues 01/14/2015  . Occult blood positive stool 12/14/2014  . Anemia 12/14/2014  . Family history of colon cancer 12/14/2014  . Change in bowel habits 12/14/2014  . GERD (gastroesophageal reflux disease) 12/14/2014  . Dyspnea 05/17/2012  . Lymphedema 05/17/2012  . Weight gain 05/17/2012  . HTN (hypertension) 05/17/2012   G8991 CK G8992 CK Bartholome Bill [phone]  01/27/2016, 5:28 PM  [place] [address] [place], [place], [address] Phone: [phone]   Fax:  [phone]  Name: ANIA LEVAY MRN: [id number] Date of Birth: 1946/02/11    PHYSICAL THERAPY DISCHARGE SUMMARY  Visits from Start of Care: 8  Current functional level related to goals / functional outcomes: See above    Remaining deficits: See above   Education / Equipment: Importance of using compression everyday   Plan: Patient agrees to discharge.  Patient goals were partially met. Patient is being discharged due to being pleased with the current functional level.  ?????       Rayetta Humphrey, [place] CLT [phone]

## 2016-01-29 ENCOUNTER — Encounter (HOSPITAL_COMMUNITY)
Admission: RE | Admit: 2016-01-29 | Discharge: 2016-01-29 | Disposition: A | Discharge status: Home / Self Care | Payer: Medicare Other | Source: Ambulatory Visit | Attending: Nephrology | Admitting: Nephrology

## 2016-01-29 ENCOUNTER — Ambulatory Visit (HOSPITAL_COMMUNITY): Payer: Medicare Other | Admitting: Physical Therapy

## 2016-01-29 DIAGNOSIS — D509 Iron deficiency anemia, unspecified: Secondary | ICD-10-CM | POA: Diagnosis not present

## 2016-01-29 MED ORDER — SODIUM CHLORIDE 0.9 % IV SOLN
510.0000 mg | INTRAVENOUS | Status: DC
  Administered 2016-01-29: 510 mg via INTRAVENOUS
  Filled 2016-01-29: qty 17

## 2016-01-29 MED ORDER — SODIUM CHLORIDE 0.9 % IV SOLN
INTRAVENOUS | Status: DC
  Administered 2016-01-29: 12:00:00 via INTRAVENOUS

## 2016-01-31 ENCOUNTER — Encounter (HOSPITAL_COMMUNITY): Payer: Medicare Other | Admitting: Physical Therapy

## 2016-02-03 ENCOUNTER — Encounter (HOSPITAL_COMMUNITY): Payer: Medicare Other | Admitting: Physical Therapy

## 2016-02-05 ENCOUNTER — Encounter (HOSPITAL_COMMUNITY): Payer: Medicare Other | Admitting: Physical Therapy

## 2016-02-05 ENCOUNTER — Encounter (HOSPITAL_COMMUNITY): Payer: Self-pay

## 2016-02-05 ENCOUNTER — Encounter (HOSPITAL_COMMUNITY)
Admission: RE | Admit: 2016-02-05 | Discharge: 2016-02-05 | Disposition: A | Discharge status: Home / Self Care | Payer: Medicare Other | Source: Ambulatory Visit | Attending: Nephrology | Admitting: Nephrology

## 2016-02-05 DIAGNOSIS — D509 Iron deficiency anemia, unspecified: Secondary | ICD-10-CM | POA: Diagnosis not present

## 2016-02-05 MED ORDER — SODIUM CHLORIDE 0.9 % IV SOLN
510.0000 mg | Freq: Once | INTRAVENOUS | Status: AC
  Administered 2016-02-05: 510 mg via INTRAVENOUS
  Filled 2016-02-05: qty 17

## 2016-02-05 MED ORDER — SODIUM CHLORIDE 0.9 % IV SOLN
Freq: Once | INTRAVENOUS | Status: AC
  Administered 2016-02-05: 13:00:00 via INTRAVENOUS

## 2016-02-07 ENCOUNTER — Encounter (HOSPITAL_COMMUNITY): Payer: Medicare Other | Admitting: Physical Therapy

## 2016-02-10 ENCOUNTER — Encounter (HOSPITAL_COMMUNITY): Payer: Medicare Other | Admitting: Physical Therapy

## 2016-02-12 ENCOUNTER — Encounter (HOSPITAL_COMMUNITY): Payer: Medicare Other | Admitting: Physical Therapy

## 2016-02-14 ENCOUNTER — Encounter (HOSPITAL_COMMUNITY): Payer: Medicare Other | Admitting: Physical Therapy

## 2016-03-29 DIAGNOSIS — I5032 Chronic diastolic (congestive) heart failure: Secondary | ICD-10-CM | POA: Insufficient documentation

## 2016-03-29 DIAGNOSIS — I5033 Acute on chronic diastolic (congestive) heart failure: Secondary | ICD-10-CM | POA: Insufficient documentation

## 2016-03-29 NOTE — Progress Notes (Addendum)
Cardiology Office Note:    Date:  03/30/2016   ID:  [name], [name] 01-14-1946, MRN [id number]  PCP:  Sherrie Mustache, MD  Cardiologist:  Dr. Lauree Chandler   Electrophysiologist:  N/a Oneida Alar  Referring MD: Dione Housekeeper, MD   Chief Complaint  Patient presents with  . Hospitalization Follow-up    admx x 2 to [facility] with HF, Chest pain   History of Present Illness:    [name] is a 70 y.o. female with a hx of Chronic lymphedema, back pain, HTN, fibromyalgia, diastolic HF. She was last seen by Dr. Angelena Form 8/16. She was off of diuretics secondary to worsening renal function.  She returns today for posthospitalization follow-up. Unfortunately, I have no records available at the time of her visit. Most of her information was obtained through talking to the patient and her daughter. They did have some patient instructions that I was able to look through during the visit, including lab work. Lack of records contributed to a prolonged visit (greater than 60 minutes).   She was admitted 7/27-7/30 to [facility] in [place], [place] for congestive heart failure. She apparently had a 14 pound weight gain in several days. She left the hospital in improved condition. However, she was readmitted 8/4-8/5 with chest pain and shortness of breath.  She tells me that the ED doctor spoke to Dr. Angelena Form by telephone. There is no record of this in the chart. The patient has paperwork to get a stress test at [facility] today. However, that test was never ordered. She was placed on isosorbide at discharge. She tells me that she had a CT scan of her chest as well as an echocardiogram. She was not discharged on any different diuretic regimen. She has been on Aldactone 25 mg twice a day and metolazone 5 mg Monday, Wednesday, Friday for quite some time. She is chronically followed by nephrology in [place].   Since DC from the hospital, she has gained 3 pounds. She has started to  notice worsening symptoms. Prior to her hospitalization, she was quite sedentary. She has a home health nurse and physical therapist. Her fatigue has all may gotten worse over the past several months. She notes worsening dyspnea and exertion. She is NYHA 3. She sleeps on an incline. She sleeps with CPAP. She has chronic lymphedema. Her edema seems of gotten worse this week. She also notes chest pressure. This seems to occur when she is short of breath. It occurs with minimal activity. She denies associated symptoms. She denies syncope. She does have a nonproductive cough. Denies fevers. Denies any bleeding issues.   Prior CV studies that were reviewed today include:    Myoview 7/16  The left ventricular ejection fraction is normal (55-65%).  Nuclear stress EF: 56%.  There was no ST segment deviation noted during stress.  This is a low risk study. Stress nuclear study with a small, severe intensity, fixed inferior apical defect suggestive of small prior infarct; no ischemia; EF 56 with normal wall motion.  Echo 7/16 EF 60-65%, normal wall motion, grade 1 diastolic dysfunction, mildly dilated aortic root, mild LAE  LHC 5/09 FINAL INTERPRETATION:  1. Normal coronary anatomy.  2. Normal left ventricular function.  Past Medical History:  Diagnosis Date  . Anemia   . Anxiety   . Arthritis   . Brain lesion    2 types  . Cervical spondylosis with myelopathy   . Chest pain    Normal cardiac cath  5/09  . Depression   . Dumping syndrome   . Edema   . Fatigue   . Fibromyalgia   . Hyperlipidemia   . Hypertension   . Hypothyroidism   . Lumbar spondylosis   . Lymphedema    seeing specialist for this  . PONV (postoperative nausea and vomiting)   . Restless leg syndrome   . Rotator cuff injury   . Stroke [facility])    TIAs "mini strokes"- unsure of last TIA  . Syncope   . Tremors of nervous system     Past Surgical History:  Procedure Laterality Date  . APPENDECTOMY  1966  .  CHOLECYSTECTOMY  2015  . CHOLECYSTECTOMY  2015  . CORONARY ANGIOGRAM  2009   Normal coronaries  . Hip Bursa Surgery Bilateral   . KNEE ARTHROSCOPY Bilateral   . LUMBAR DISC SURGERY     x2  . LUMBAR FUSION    . ROTATOR CUFF REPAIR Bilateral   . TOTAL ABDOMINAL HYSTERECTOMY    . TOTAL KNEE ARTHROPLASTY Left   . TOTAL KNEE ARTHROPLASTY Right 11/18/2012   Procedure: TOTAL KNEE ARTHROPLASTY;  Surgeon: Yvette Rack., MD;  Location: [facility];  Service: Orthopedics;  Laterality: Right;    Current Medications: Outpatient Medications Prior to Visit  Medication Sig Dispense Refill  . alendronate (FOSAMAX) 70 MG tablet Take 70 mg by mouth once a week. Take with a full glass of water on an empty stomach.    . ALPRAZolam (XANAX) 0.5 MG tablet Take 0.5 mg by mouth at bedtime.     Marland Kitchen amLODipine (NORVASC) 5 MG tablet Take by mouth Daily.    Marland Kitchen aspirin 81 MG tablet Take 81 mg by mouth at bedtime.    . baclofen (LIORESAL) 10 MG tablet Take 10 mg by mouth 3 (three) times daily.    Marland Kitchen donepezil (ARICEPT) 10 MG tablet Take 1 tablet by mouth daily.    Marland Kitchen escitalopram (LEXAPRO) 20 MG tablet Take 1.5 tablets by mouth daily with supper.    . folic acid (FOLVITE) 1 MG tablet Take 1 mg by mouth 2 (two) times daily.    Marland Kitchen lamoTRIgine (LAMICTAL) 100 MG tablet Take 1 tablet by mouth 2 (two) times daily.    . Magnesium 250 MG TABS Take 2 tablets by mouth 2 (two) times daily.    Marland Kitchen omeprazole (PRILOSEC) 40 MG capsule Take 1 capsule by mouth daily.    Marland Kitchen oxyCODONE-acetaminophen (PERCOCET) 7.5-325 MG per tablet Take 1 tablet by mouth. Every 4-6 hours as needed    . pramipexole (MIRAPEX) 0.25 MG tablet Take 0.5 mg by mouth at bedtime and may repeat dose one time if needed.     . pravastatin (PRAVACHOL) 80 MG tablet Take 1 tablet by mouth daily.    Marland Kitchen spironolactone (ALDACTONE) 25 MG tablet Take 25 mg by mouth Twice daily.    . Vitamin D, Ergocalciferol, (DRISDOL) 50000 UNITS CAPS Take 50,000 Units by mouth every 30 (thirty)  days. Patient takes this on first Friday of the month    . metolazone (ZAROXOLYN) 5 MG tablet Take 5 mg by mouth 3 (three) times a week. MON, WED, FRI    . levothyroxine (SYNTHROID, LEVOTHROID) 50 MCG tablet Take 50 mcg by mouth daily before breakfast.    . Linaclotide (LINZESS) 145 MCG CAPS capsule Take 1 capsule daily on an empty stomach at least 30 minutes before a meal (Patient not taking: Reported on 03/30/2016) 30 capsule 0   No facility-administered medications  prior to visit.       Allergies:   Oxycodone; Morphine; Hydromorphone; and Morphine and related   Social History   Social History  . Marital status: Widowed    Spouse name: N/A  . Number of children: 3  . Years of education: N/A   Occupational History  . retired    Social History Main Topics  . Smoking status: Never Smoker  . Smokeless tobacco: Never Used  . Alcohol use No  . Drug use: No  . Sexual activity: Not Currently   Other Topics Concern  . None   Social History Narrative   Pt does use caffeine. Lives alone. 3 children. Retired.     Family History:  The patient's family history includes Cancer in her brother; Colon cancer in her brother; Heart disease in her brother and mother; Heart failure in her mother; Irritable bowel syndrome in her daughter; Kidney disease in her mother; Lung cancer in her father; Ovarian cancer in her maternal grandmother; Tremor (age of onset: 46) in her maternal grandmother; Uterine cancer in her maternal grandmother.   ROS:   Please see the history of present illness.    Review of Systems  Constitution: Positive for malaise/fatigue and weight gain.  Cardiovascular: Positive for chest pain, dyspnea on exertion and leg swelling.  Respiratory: Positive for cough and shortness of breath.   Hematologic/Lymphatic: Bruises/bleeds easily.  Musculoskeletal: Positive for back pain, joint pain and myalgias.  Gastrointestinal: Positive for abdominal pain and constipation.  Neurological:  Positive for dizziness and loss of balance.   All other systems reviewed and are negative.   EKGs/Labs/Other Test Reviewed:    EKG:  EKG is  ordered today.  The ekg ordered today demonstrates NSR, HR 63, left axis deviation, IVCD, poor R-wave progression, QTc 454 ms, no significant change compared to prior tracing  Recent Labs: No results found for requested labs within last 8760 hours.  Labs from [facility] 03/28/16: Hgb 12.2, K 0.1, creatinine 1.27, magnesium 2.2, ALT 40, TSH 1.63, d-dimer 0.58, troponin negative, BNP 158  Recent Lipid Panel No results found for: CHOL, TRIG, HDL, CHOLHDL, VLDL, LDLCALC, LDLDIRECT   Physical Exam:    VS:  BP 140/60   Pulse 64   Ht 5\' 6"  (1.676 m)   Wt 259 lb 12.8 oz (117.8 kg)   BMI 41.93 kg/m     Wt Readings from Last 3 Encounters:  03/30/16 259 lb 12.8 oz (117.8 kg)  04/22/15 244 lb 12.8 oz (111 kg)  02/21/15 252 lb (114.3 kg)     Physical Exam  Constitutional: She is oriented to person, place, and time. She appears well-developed and well-nourished. No distress.  HENT:  Head: Normocephalic and atraumatic.  Eyes: No scleral icterus.  Neck: Normal range of motion. JVD present.  JVP 6 cm  Cardiovascular: Normal rate, regular rhythm, S1 normal, S2 normal and normal heart sounds.  Exam reveals no gallop and no friction rub.   No murmur heard. Pulmonary/Chest: Effort normal and breath sounds normal. She has no wheezes. She has no rhonchi. She has no rales.  Abdominal: Soft. Bowel sounds are normal. There is no tenderness.  Musculoskeletal: She exhibits no edema.  She has nonpitting edema in bilateral LE  Neurological: She is alert and oriented to person, place, and time.  Skin: Skin is warm and dry.  Psychiatric: She has a normal mood and affect.    ASSESSMENT:    1. Chronic diastolic CHF (congestive heart failure) ([place])  2. Essential hypertension   3. Chest pain, unspecified chest pain type   4. Lymphedema   5. CKD (chronic  kidney disease) stage 3, GFR 30-59 ml/min    PLAN:    In order of problems listed above:  1. Chronic diastolic CHF - The patient presented for post hospital follow-up. She was admitted to [facility] twice in the last couple of weeks. She was apparently diuresed for acute on chronic diastolic CHF. She did have improved symptoms upon discharge. However, she has gained 3 pounds in the last several days and notes worsening symptoms which includes chest discomfort. Her exam is difficult given her size. In the past, her diuretics have been adjusted due to worsening renal function. Her creatinine recently has been fairly stable. At this point, I do not think that spironolactone twice a day and metolazone 3 times a week are keeping her volume status stable. I think she would be better off on Lasix.  -  DC metolazone  -  Start furosemide 40 mg daily  -  BMET, BNP today. Repeat BMET 1 week  -  Obtain records from [facility] (at the time of this dictation, records have just arrived)  -  Close FU with Dr. Lauree Chandler   2. HTN - borderline control. Continue to monitor.  3. Chest pain -  She has typical and atypical features. She had a cardiac catheterization in 2009 with normal coronary arteries. She also had a nuclear stress test last year that was low risk.  ECG is unchanged.  Arrange Lexiscan Myoview.   4. Lymphedema - This is a chronic condition. It does make her exam difficult.  5. CKD - Will need to keep a close eye on renal function and potassium with adjustments in diuretics.  Total time spent with patient today 60 minutes. This includes reviewing records, evaluating the patient and coordinating care. Face-to-face time >50%.    Medication Adjustments/Labs and Tests Ordered: Current medicines are reviewed at length with the patient today.  Concerns regarding medicines are outlined above.  Medication changes, Labs and Tests ordered today are outlined in the Patient  Instructions noted below. Patient Instructions  Medication Instructions: Your physician has recommended you make the following change in your medication:  1. STOP Metolazone 2. START Lasix 40 mg daily - If your weight decreases ans shortness of breath is better in a few days ; call the office if weight is up 3 lbs in one day [phone] Labwork: 1. TODAY : BMET, BNP 2. 1 Week : BMET Procedures/Testing: Your physician has requested that you have a lexiscan myoview. For further information please visit [email or web address]. Please follow instruction sheet, as given. Follow-Up: Your physician recommends that you schedule a follow-up appointment in: 1-2 weeks with Dr. Angelena Form per Richardson Dopp, PA Any Additional Special Instructions Will Be Listed Below (If Applicable). If you need a refill on your cardiac medications before your next appointment, please call your pharmacy.    Signed, Richardson Dopp, PA-C  03/30/2016 5:37 PM    [facility] [place], [place], [place]  [address] Phone: [phone]; Fax: [phone]       Anesthesia Physical

## 2016-03-30 ENCOUNTER — Encounter (INDEPENDENT_AMBULATORY_CARE_PROVIDER_SITE_OTHER): Payer: Self-pay

## 2016-03-30 ENCOUNTER — Encounter: Payer: Self-pay | Admitting: Physician Assistant

## 2016-03-30 ENCOUNTER — Ambulatory Visit (INDEPENDENT_AMBULATORY_CARE_PROVIDER_SITE_OTHER): Payer: Medicare Other | Admitting: Physician Assistant

## 2016-03-30 VITALS — BP 140/60 | HR 64 | Ht 66.0 in | Wt 259.8 lb

## 2016-03-30 DIAGNOSIS — I890 Lymphedema, not elsewhere classified: Secondary | ICD-10-CM | POA: Diagnosis not present

## 2016-03-30 DIAGNOSIS — I5032 Chronic diastolic (congestive) heart failure: Secondary | ICD-10-CM | POA: Diagnosis not present

## 2016-03-30 DIAGNOSIS — N183 Chronic kidney disease, stage 3 unspecified: Secondary | ICD-10-CM

## 2016-03-30 DIAGNOSIS — I10 Essential (primary) hypertension: Secondary | ICD-10-CM

## 2016-03-30 DIAGNOSIS — R079 Chest pain, unspecified: Secondary | ICD-10-CM | POA: Diagnosis not present

## 2016-03-30 MED ORDER — FUROSEMIDE 40 MG PO TABS: 40.0000 mg | ORAL_TABLET | Freq: Every day | ORAL | 3 refills | Status: DC

## 2016-03-30 NOTE — Patient Instructions (Addendum)
Medication Instructions: Your physician has recommended you make the following change in your medication:  1. STOP Metolazone 2. START Lasix 40 mg daily - If your weight decreases ans shortness of breath is better in a few days ; call the office if weight is up 3 lbs in one day [phone] Labwork: 1. TODAY : BMET, BNP 2. 1 Week : BMET Procedures/Testing: Your physician has requested that you have a lexiscan myoview. For further information please visit [email or web address]. Please follow instruction sheet, as given. Follow-Up: Your physician recommends that you schedule a follow-up appointment in: 1-2 weeks with Dr. Angelena Form per Richardson Dopp, PA Any Additional Special Instructions Will Be Listed Below (If Applicable). If you need a refill on your cardiac medications before your next appointment, please call your pharmacy.   Heart-Healthy Eating Plan Many factors influence your heart health, including eating and exercise habits. Heart (coronary) risk increases with abnormal blood fat (lipid) levels. Heart-healthy meal planning includes limiting unhealthy fats, increasing healthy fats, and making other small dietary changes. This includes maintaining a healthy body weight to help keep lipid levels within a normal range. WHAT IS MY PLAN?  Your health care provider recommends that you:  Get no more than _________% of the total calories in your daily diet from fat.  Limit your intake of saturated fat to less than _________% of your total calories each day.  Limit the amount of cholesterol in your diet to less than _________ mg per day. WHAT TYPES OF FAT SHOULD I CHOOSE?  Choose healthy fats more often. Choose monounsaturated and polyunsaturated fats, such as olive oil and canola oil, flaxseeds, walnuts, almonds, and seeds.  Eat more omega-3 fats. Good choices include salmon, mackerel, sardines, tuna, flaxseed oil, and ground flaxseeds. Aim to eat fish at least two times each week.  Limit  saturated fats. Saturated fats are primarily found in animal products, such as meats, butter, and cream. Plant sources of saturated fats include palm oil, palm kernel oil, and coconut oil.  Avoid foods with partially hydrogenated oils in them. These contain trans fats. Examples of foods that contain trans fats are stick margarine, some tub margarines, cookies, crackers, and other baked goods. WHAT GENERAL GUIDELINES DO I NEED TO FOLLOW?  Check food labels carefully to identify foods with trans fats or high amounts of saturated fat.  Fill one half of your plate with vegetables and green salads. Eat 4-5 servings of vegetables per day. A serving of vegetables equals 1 cup of raw leafy vegetables,  cup of raw or cooked cut-up vegetables, or  cup of vegetable juice.  Fill one fourth of your plate with whole grains. Look for the word "whole" as the first word in the ingredient list.  Fill one fourth of your plate with lean protein foods.  Eat 4-5 servings of fruit per day. A serving of fruit equals one medium whole fruit,  cup of dried fruit,  cup of fresh, frozen, or canned fruit, or  cup of 100% fruit juice.  Eat more foods that contain soluble fiber. Examples of foods that contain this type of fiber are apples, broccoli, carrots, beans, peas, and barley. Aim to get 20-30 g of fiber per day.  Eat more home-cooked food and less restaurant, buffet, and fast food.  Limit or avoid alcohol.  Limit foods that are high in starch and sugar.  Avoid fried foods.  Cook foods by using methods other than frying. Baking, boiling, grilling, and broiling are all great options. Other fat-reducing  suggestions include:  Removing the skin from poultry.  Removing all visible fats from meats.  Skimming the fat off of stews, soups, and gravies before serving them.  Steaming vegetables in water or broth.  Lose weight if you are overweight. Losing just 5-10% of your initial body weight can help your  overall health and prevent diseases such as diabetes and heart disease.  Increase your consumption of nuts, legumes, and seeds to 4-5 servings per week. One serving of dried beans or legumes equals  cup after being cooked, one serving of nuts equals 1 ounces, and one serving of seeds equals  ounce or 1 tablespoon.  You may need to monitor your salt (sodium) intake, especially if you have high blood pressure. Talk with your health care provider or dietitian to get more information about reducing sodium. WHAT FOODS CAN I EAT? Grains Breads, including [place], [place], pita, wheat, raisin, rye, oatmeal, and [place]. Tortillas that are neither fried nor made with lard or trans fat. Low-fat rolls, including hotdog and hamburger buns and English muffins. Biscuits. Muffins. Waffles. Pancakes. Light popcorn. Whole-grain cereals. Flatbread. Melba toast. Pretzels. Breadsticks. Rusks. Low-fat snacks and crackers, including oyster, saltine, matzo, graham, animal, and rye. Rice and pasta, including brown rice and those that are made with whole wheat. Vegetables All vegetables. Fruits All fruits, but limit coconut. Meats and Other Protein Sources Lean, well-trimmed beef, veal, pork, and lamb. Chicken and [place] without skin. All fish and shellfish. Wild duck, rabbit, pheasant, and venison. Egg whites or low-cholesterol egg substitutes. Dried beans, peas, lentils, and tofu.Seeds and most nuts. Dairy Low-fat or nonfat cheeses, including ricotta, string, and mozzarella. Skim or 1% milk that is liquid, powdered, or evaporated. Buttermilk that is made with low-fat milk. Nonfat or low-fat yogurt. Beverages Mineral water. Diet carbonated beverages. Sweets and Desserts Sherbets and fruit ices. Honey, jam, marmalade, jelly, and syrups. Meringues and gelatins. Pure sugar candy, such as hard candy, jelly beans, gumdrops, mints, marshmallows, and small amounts of dark chocolate. [employer] Eat all sweets and  desserts in moderation. Fats and Oils Nonhydrogenated (trans-free) margarines. Vegetable oils, including soybean, sesame, sunflower, olive, peanut, safflower, corn, canola, and cottonseed. Salad dressings or mayonnaise that are made with a vegetable oil. Limit added fats and oils that you use for cooking, baking, salads, and as spreads. Other Cocoa powder. Coffee and tea. All seasonings and condiments. The items listed above may not be a complete list of recommended foods or beverages. Contact your dietitian for more options. WHAT FOODS ARE NOT RECOMMENDED? Grains Breads that are made with saturated or trans fats, oils, or whole milk. Croissants. Butter rolls. Cheese breads. Sweet rolls. Donuts. Buttered popcorn. Chow mein noodles. High-fat crackers, such as cheese or butter crackers. Meats and Other Protein Sources Fatty meats, such as hotdogs, short ribs, sausage, spareribs, bacon, ribeye roast or steak, and mutton. High-fat deli meats, such as salami and bologna. Caviar. Domestic duck and goose. Organ meats, such as kidney, liver, sweetbreads, brains, gizzard, chitterlings, and heart. Dairy Cream, sour cream, cream cheese, and creamed cottage cheese. Whole milk cheeses, including blue (bleu), Monterey Jack, [place], [place], American, [place], Swiss, [place], [place], and [place]. Whole or 2% milk that is liquid, evaporated, or condensed. Whole buttermilk. Cream sauce or high-fat cheese sauce. Yogurt that is made from whole milk. Beverages Regular sodas and drinks with added sugar. Sweets and Desserts Frosting. Pudding. Cookies. Cakes other than angel food cake. Candy that has milk chocolate or [place]izardo chocolate, hydrogenated fat, butter, coconut,  or unknown ingredients. Buttered syrups. Full-fat ice cream or ice cream drinks. Fats and Oils Gravy that has suet, meat fat, or shortening. Cocoa butter, hydrogenated oils, palm oil, coconut oil, palm kernel oil. These can often be found in baked  products, candy, fried foods, nondairy creamers, and whipped toppings. Solid fats and shortenings, including bacon fat, salt pork, lard, and butter. Nondairy cream substitutes, such as coffee creamers and sour cream substitutes. Salad dressings that are made of unknown oils, cheese, or sour cream. The items listed above may not be a complete list of foods and beverages to avoid. Contact your dietitian for more information.   This information is not intended to replace advice given to you by your health care provider. Make sure you discuss any questions you have with your health care provider.   Document Released: 05/19/2008 Document Revised: 08/31/2014 Document Reviewed: 02/01/2014 Elsevier Interactive Patient Education [employer].

## 2016-03-31 ENCOUNTER — Telehealth: Payer: Self-pay | Admitting: *Deleted

## 2016-03-31 DIAGNOSIS — I10 Essential (primary) hypertension: Secondary | ICD-10-CM

## 2016-03-31 LAB — BASIC METABOLIC PANEL
BUN: 37 mg/dL — ABNORMAL HIGH (ref 7–25)
CALCIUM: 10.5 mg/dL — AB (ref 8.6–10.4)
CO2: 30 mmol/L (ref 20–31)
CREATININE: 1.36 mg/dL — AB (ref 0.60–0.93)
Chloride: 94 mmol/L — ABNORMAL LOW (ref 98–110)
Glucose, Bld: 106 mg/dL — ABNORMAL HIGH (ref 65–99)
Potassium: 4.7 mmol/L (ref 3.5–5.3)
SODIUM: 133 mmol/L — AB (ref 135–146)

## 2016-03-31 LAB — BRAIN NATRIURETIC PEPTIDE: Brain Natriuretic Peptide: 20.8 pg/mL (ref <100)

## 2016-03-31 MED ORDER — FUROSEMIDE 40 MG PO TABS: 20.0000 mg | ORAL_TABLET | Freq: Every day | ORAL | 3 refills | Status: DC

## 2016-03-31 NOTE — Telephone Encounter (Signed)
DPR for daughter [name] who has been notified of lab results for pt by phone with verbal understanding. Agreeable to lasix 40 mg daily x 2 days then decrease lasix to 20 mg daily; bmet 8/16.

## 2016-04-01 ENCOUNTER — Encounter: Payer: Self-pay | Admitting: Cardiovascular Disease

## 2016-04-02 ENCOUNTER — Telehealth (HOSPITAL_COMMUNITY): Payer: Self-pay | Admitting: *Deleted

## 2016-04-02 NOTE — Telephone Encounter (Signed)
Patient given detailed instructions per Myocardial Perfusion Study Information Sheet for the test on 04/08/16. Patient notified to arrive 15 minutes early and that it is imperative to arrive on time for appointment to keep from having the test rescheduled.  If you need to cancel or reschedule your appointment, please call the office within 24 hours of your appointment. Failure to do so may result in a cancellation of your appointment, and a $50 no show fee. Patient verbalized understanding.Hubbard Robinson, RN

## 2016-04-08 ENCOUNTER — Ambulatory Visit (HOSPITAL_COMMUNITY): Payer: Medicare Other | Attending: Cardiology

## 2016-04-08 ENCOUNTER — Other Ambulatory Visit: Payer: Medicare Other | Admitting: *Deleted

## 2016-04-08 DIAGNOSIS — R5383 Other fatigue: Secondary | ICD-10-CM | POA: Diagnosis not present

## 2016-04-08 DIAGNOSIS — R9439 Abnormal result of other cardiovascular function study: Secondary | ICD-10-CM | POA: Diagnosis not present

## 2016-04-08 DIAGNOSIS — R0789 Other chest pain: Secondary | ICD-10-CM | POA: Insufficient documentation

## 2016-04-08 DIAGNOSIS — R0602 Shortness of breath: Secondary | ICD-10-CM | POA: Insufficient documentation

## 2016-04-08 DIAGNOSIS — I10 Essential (primary) hypertension: Secondary | ICD-10-CM | POA: Diagnosis not present

## 2016-04-08 DIAGNOSIS — I5032 Chronic diastolic (congestive) heart failure: Secondary | ICD-10-CM

## 2016-04-08 DIAGNOSIS — R0609 Other forms of dyspnea: Secondary | ICD-10-CM | POA: Diagnosis not present

## 2016-04-08 DIAGNOSIS — R079 Chest pain, unspecified: Secondary | ICD-10-CM

## 2016-04-08 LAB — BASIC METABOLIC PANEL
BUN: 31 mg/dL — ABNORMAL HIGH (ref 7–25)
CHLORIDE: 95 mmol/L — AB (ref 98–110)
CO2: 30 mmol/L (ref 20–31)
CREATININE: 1.54 mg/dL — AB (ref 0.60–0.93)
Calcium: 10.5 mg/dL — ABNORMAL HIGH (ref 8.6–10.4)
Glucose, Bld: 98 mg/dL (ref 65–99)
POTASSIUM: 4.9 mmol/L (ref 3.5–5.3)
Sodium: 135 mmol/L (ref 135–146)

## 2016-04-08 MED ORDER — AMINOPHYLLINE 25 MG/ML IV SOLN
75.0000 mg | Freq: Once | INTRAVENOUS | Status: AC
  Administered 2016-04-08: 75 mg via INTRAVENOUS

## 2016-04-08 MED ORDER — TECHNETIUM TC 99M TETROFOSMIN IV KIT
32.7000 | PACK | Freq: Once | INTRAVENOUS | Status: AC | PRN
  Administered 2016-04-08: 32.7 via INTRAVENOUS
  Filled 2016-04-08: qty 33

## 2016-04-08 MED ORDER — REGADENOSON 0.4 MG/5ML IV SOLN
0.4000 mg | Freq: Once | INTRAVENOUS | Status: AC
Start: 2016-04-08 — End: 2016-04-08
  Administered 2016-04-08: 0.4 mg via INTRAVENOUS

## 2016-04-09 ENCOUNTER — Telehealth: Payer: Self-pay | Admitting: *Deleted

## 2016-04-09 ENCOUNTER — Ambulatory Visit (HOSPITAL_COMMUNITY): Payer: Medicare Other | Attending: Cardiovascular Disease

## 2016-04-09 LAB — MYOCARDIAL PERFUSION IMAGING
CHL CUP NUCLEAR SSS: 3
CSEPPHR: 84 {beats}/min
LV dias vol: 140 mL (ref 46–106)
LVSYSVOL: 61 mL
RATE: 0.3
Rest HR: 61 {beats}/min
SDS: 2
SRS: 1
TID: 1.1

## 2016-04-09 MED ORDER — TECHNETIUM TC 99M TETROFOSMIN IV KIT
32.5000 | PACK | Freq: Once | INTRAVENOUS | Status: AC | PRN
  Administered 2016-04-09: 33 via INTRAVENOUS
  Filled 2016-04-09: qty 33

## 2016-04-09 NOTE — Telephone Encounter (Signed)
DPR for daughter [name]. [name] notified of pt's lab results and findings. Asked how pt's weight had been doing. [name] states weight is 256; this is down 3 lb's from 8/7 when she saw [employer]. PA. She did state pt is coughing though. She states pt's swelling is doing fine, pt elevates her legs when sitting per daughter. As we were speaking daughter [name] states to me pt was here for part 2 of her myoview. I did advise daughter to make sure to keep appt with Dr. Angelena Form 04/16/16. I looked for a sooner appt maybe to see Richardson Dopp, PA who pt had seen last though nothing available sooner than her appt already on 04/16/16 with Dr. Angelena Form. I advised [name] to make sure to continue to weigh pt daily and watch for increased edema, sob or if cough worsen, to call the office or take pt to the ED. Daughter [name] agreeable to plan of care.

## 2016-04-10 ENCOUNTER — Telehealth: Payer: Self-pay | Admitting: Cardiovascular Disease

## 2016-04-10 ENCOUNTER — Telehealth: Payer: Self-pay | Admitting: *Deleted

## 2016-04-10 NOTE — Telephone Encounter (Signed)
New message      Pt has gained 4lbs since yesterday and is cannot stop coughing.  Should the daughter give her extra lasix?

## 2016-04-10 NOTE — Telephone Encounter (Signed)
DPR for daughter Vicente Males. Vicente Males notified of myoview results for pt by phone with verbal understanding.

## 2016-04-10 NOTE — Telephone Encounter (Signed)
Spoke with daughter Vicente Males. Wt gain 4 lb in 24 hrs. Increased cough and lower extremity edema. Pt takes lasix 20 mg daily. Pt is watching her salt and fluid intake. History discussed with DOD Dr Caryl Comes. Pt to take lasix 40 mg daily x 3 days then return to 20 mg a day. Daughter Vicente Males verbalized understanding and confirmed app next week for f/u with Dr Angelena Form. Pt to go to hospital with further need over the weekend.

## 2016-04-12 ENCOUNTER — Telehealth: Payer: Self-pay | Admitting: Cardiology

## 2016-04-12 NOTE — Telephone Encounter (Signed)
Received page about patient's Lasix dosing. Called the patient's daughter back, she was concerned about her mother's cough. Recently they had called the office, her lasix was increased to 40mg  daily as the patient had weight gain and lower extremity edema. With the increased dose of Lasix, the patient lost 7 pounds and lower extremity edema resolved. However, patient still has intermittent cough with clear sputum. No chest pain, fever, no dyspnea, no palpitations. I advised the patient's daughter to call back or call the office if the patient develops SOB or chest pain. The patient has an appt. With Dr. Angelena Form in 4 days, she will see him then to discuss cough if still present.

## 2016-04-13 NOTE — Telephone Encounter (Signed)
Can we check with her to see if her weight is down after increased dose of Lasix over the weekend? Thanks, chris

## 2016-04-13 NOTE — Telephone Encounter (Signed)
See phone note 04/12/16.

## 2016-04-16 ENCOUNTER — Encounter: Payer: Self-pay | Admitting: Physician Assistant

## 2016-04-16 ENCOUNTER — Ambulatory Visit (INDEPENDENT_AMBULATORY_CARE_PROVIDER_SITE_OTHER): Payer: Medicare Other | Admitting: Cardiovascular Disease

## 2016-04-16 VITALS — BP 120/68 | HR 64 | Ht 66.0 in | Wt 255.4 lb

## 2016-04-16 DIAGNOSIS — I5033 Acute on chronic diastolic (congestive) heart failure: Secondary | ICD-10-CM | POA: Diagnosis not present

## 2016-04-16 DIAGNOSIS — I10 Essential (primary) hypertension: Secondary | ICD-10-CM | POA: Diagnosis not present

## 2016-04-16 LAB — BRAIN NATRIURETIC PEPTIDE: BRAIN NATRIURETIC PEPTIDE: 29.7 pg/mL (ref <100)

## 2016-04-16 LAB — BASIC METABOLIC PANEL
BUN: 34 mg/dL — ABNORMAL HIGH (ref 7–25)
CHLORIDE: 100 mmol/L (ref 98–110)
CO2: 29 mmol/L (ref 20–31)
Calcium: 10.2 mg/dL (ref 8.6–10.4)
Creat: 1.42 mg/dL — ABNORMAL HIGH (ref 0.60–0.93)
Glucose, Bld: 86 mg/dL (ref 65–99)
Potassium: 4.8 mmol/L (ref 3.5–5.3)
SODIUM: 137 mmol/L (ref 135–146)

## 2016-04-16 MED ORDER — FUROSEMIDE 40 MG PO TABS: 40.0000 mg | ORAL_TABLET | Freq: Every day | ORAL | 6 refills | Status: DC

## 2016-04-16 NOTE — Patient Instructions (Signed)
Medication Instructions:  Your physician has recommended you make the following change in your medication: Increase furosemide to 40 mg by mouth daily.     Labwork: Lab work to be done today--BMP and BNP.  Your physician recommends that you return for lab work in: 7 days--BMP   Testing/Procedures: none  Follow-Up: You have been referred to CHF clinic.  Please schedule patient for new patient appt.   Your physician wants you to follow-up in: 6 months with Dr. Angelena Form.  You will receive a reminder letter in the mail two months in advance. If you don't receive a letter, please call our office to schedule the follow-up appointment.   Any Other Special Instructions Will Be Listed Below (If Applicable).     If you need a refill on your cardiac medications before your next appointment, please call your pharmacy.

## 2016-04-16 NOTE — Progress Notes (Signed)
Chief Complaint  Patient presents with  . Congestive Heart Failure    History of Present Illness: 70 yo female with history of chronic lymphedema, back pain, HTN, fibromyalgia who is here today for cardiac follow up . She was seen by Kerin Ransom, PA-C in May 2016 as a new patient for evaluation of lower extremity edema and requested to follow in my office. She had a normal cath in 2009. Echo 03/05/15 with normal LV systolic function, grade 1 diastolic dysfunction, no significant valvular disease. Stress myoview 02/21/15 without ischemia. She had been managed on Lasix in primary care but her renal function worsened so Lasix was stopped for a period of time and she was seen in Nephrology clinic (Dr. Lowanda Foster) and tried on metolazone. She has also been followed in the lymphedema clinic. She was admitted to [facility] July 2017 several times with CHF and chest pain. She was diuresed, sent home on aldactone, metolazone which was her home regimen with no changes. She was also started on Imdur and an outpatient stress test was planned. She was seen in our office 03/30/16 by Richardson Dopp, PA-C and her weight was back up by 3 lbs. Lasix was added and metolazone was stopped. Stress myoview 04/09/16 with no ischemia. She has since been on Lasix 20 mg daily along with aldactone. Her weight had been increasing last week and her cough returned. Her daughter spoke with our on call team who suggested that she increase her Lasix to 40 mg daily for three days. With increased Lasix, her cough resolved and her weight went down by 7 lbs. Since being back on the Lasix 20 mg daily, her cough has returned and she is up 3 lbs. She has no fever, chills, rigors. Clear mucus production. LE edema/lymphedema stable.     Primary Care Physician: Sherrie Mustache, MD   Past Medical History:  Diagnosis Date  . Anemia   . Anxiety   . Arthritis   . Brain lesion    2 types  . Cervical spondylosis with myelopathy   . Chest  pain    Normal cardiac cath 5/09  . Depression   . Dumping syndrome   . Edema   . Fatigue   . Fibromyalgia   . Hyperlipidemia   . Hypertension   . Hypothyroidism   . Lumbar spondylosis   . Lymphedema    seeing specialist for this  . PONV (postoperative nausea and vomiting)   . Restless leg syndrome   . Rotator cuff injury   . Stroke [facility])    TIAs "mini strokes"- unsure of last TIA  . Syncope   . Tremors of nervous system     Past Surgical History:  Procedure Laterality Date  . APPENDECTOMY  1966  . CHOLECYSTECTOMY  2015  . CHOLECYSTECTOMY  2015  . CORONARY ANGIOGRAM  2009   Normal coronaries  . Hip Bursa Surgery Bilateral   . KNEE ARTHROSCOPY Bilateral   . LUMBAR DISC SURGERY     x2  . LUMBAR FUSION    . ROTATOR CUFF REPAIR Bilateral   . TOTAL ABDOMINAL HYSTERECTOMY    . TOTAL KNEE ARTHROPLASTY Left   . TOTAL KNEE ARTHROPLASTY Right 11/18/2012   Procedure: TOTAL KNEE ARTHROPLASTY;  Surgeon: Yvette Rack., MD;  Location: [facility];  Service: Orthopedics;  Laterality: Right;    Current Outpatient Prescriptions  Medication Sig Dispense Refill  . alendronate (FOSAMAX) 70 MG tablet Take 70 mg by mouth once a week. Take with  a full glass of water on an empty stomach.    . ALPRAZolam (XANAX) 0.5 MG tablet Take 0.5 mg by mouth at bedtime.     Marland Kitchen amLODipine (NORVASC) 5 MG tablet Take 5 mg by mouth Daily.     Marland Kitchen aspirin 81 MG tablet Take 81 mg by mouth at bedtime.    . baclofen (LIORESAL) 10 MG tablet Take 10 mg by mouth 3 (three) times daily.    Marland Kitchen donepezil (ARICEPT) 10 MG tablet Take 1 tablet by mouth daily.    Marland Kitchen escitalopram (LEXAPRO) 20 MG tablet Take 1.5 tablets by mouth daily with supper.    . fentaNYL (DURAGESIC - DOSED MCG/HR) 25 MCG/HR patch Place 1 patch onto the skin every 3 (three) days.     . folic acid (FOLVITE) 1 MG tablet Take 1 mg by mouth 2 (two) times daily.    . furosemide (LASIX) 40 MG tablet Take 1 tablet (40 mg total) by mouth daily. 30 tablet 6  .  lamoTRIgine (LAMICTAL) 100 MG tablet Take 1 tablet by mouth 2 (two) times daily.    Marland Kitchen levothyroxine (SYNTHROID) 75 MCG tablet Take 75 mcg by mouth daily before breakfast.     . Magnesium 250 MG TABS Take 2 tablets by mouth 2 (two) times daily.    Marland Kitchen omeprazole (PRILOSEC) 40 MG capsule Take 1 capsule by mouth daily.    Marland Kitchen oxyCODONE-acetaminophen (PERCOCET) 7.5-325 MG per tablet Take 1 tablet by mouth. Every 4-6 hours as needed     . pramipexole (MIRAPEX) 0.25 MG tablet Take 0.5 mg by mouth at bedtime and may repeat dose one time if needed.     . pravastatin (PRAVACHOL) 80 MG tablet Take 1 tablet by mouth daily.    Marland Kitchen spironolactone (ALDACTONE) 25 MG tablet Take 25 mg by mouth Twice daily.    . Vitamin D, Ergocalciferol, (DRISDOL) 50000 UNITS CAPS Take 50,000 Units by mouth every 30 (thirty) days. Patient takes this on first Friday of the month     No current facility-administered medications for this visit.     Allergies  Allergen Reactions  . Oxycodone Itching and Hives    Other reaction(s): Delusions (intolerance)  . Hydromorphone Other (See Comments)    Other reaction(s): Other (See Comments) hallucinations hallucinations    Social History   Social History  . Marital status: Widowed    Spouse name: N/A  . Number of children: 3  . Years of education: N/A   Occupational History  . retired    Social History Main Topics  . Smoking status: Never Smoker  . Smokeless tobacco: Never Used  . Alcohol use No  . Drug use: No  . Sexual activity: Not Currently   Other Topics Concern  . Not on file   Social History Narrative   Pt does use caffeine. Lives alone. 3 children. Retired.    Family History  Problem Relation Age of Onset  . Heart disease Mother   . Heart failure Mother   . Kidney disease Mother   . Lung cancer Father   . Colon cancer Brother   . Cancer Brother     malignant thymoma  . Heart disease Brother   . Tremor Maternal Grandmother 90  . Ovarian cancer  Maternal Grandmother   . Uterine cancer Maternal Grandmother   . Irritable bowel syndrome Daughter   . Esophageal cancer Neg Hx   . Stomach cancer Neg Hx   . Rectal cancer Neg Hx  Review of Systems:  As stated in the HPI and otherwise negative.   BP 120/68   Pulse 64   Ht 5\' 6"  (1.676 m)   Wt 255 lb 6.4 oz (115.8 kg)   SpO2 97%   BMI 41.22 kg/m   Physical Examination: General: Well developed, well nourished, NAD  HEENT: OP clear, mucus membranes moist  SKIN: warm, dry. No rashes. Neuro: No focal deficits  Musculoskeletal: Muscle strength 5/5 all ext  Psychiatric: Mood and affect normal  Neck: No JVD, no carotid bruits, no thyromegaly, no lymphadenopathy.  Lungs:Clear bilaterally, no wheezes, rhonci, crackles Cardiovascular: Regular rate and rhythm. No murmurs, gallops or rubs. Abdomen:Soft. Bowel sounds present. Non-tender.  Extremities: LE enlarged bilaterally, no pitting edema. Appears to be c/w her chronic Lymphedema.   EKG:  EKG is not  ordered today. The ekg ordered today demonstrates   Recent Labs: 03/30/2016: Brain Natriuretic Peptide 20.8 04/08/2016: BUN 31; Creat 1.54; Potassium 4.9; Sodium 135   Lipid Panel No results found for: CHOL, TRIG, HDL, CHOLHDL, VLDL, LDLCALC, LDLDIRECT   Wt Readings from Last 3 Encounters:  04/16/16 255 lb 6.4 oz (115.8 kg)  03/30/16 259 lb 12.8 oz (117.8 kg)  04/22/15 244 lb 12.8 oz (111 kg)     Other studies Reviewed: Additional studies/ records that were reviewed today include: . Review of the above records demonstrates:    Assessment and Plan:   1. Acute on Chronic diastolic CHF: She has chronic lymphedema, renal insufficiency and diastolic CHF making her volume status difficult to manage. She is now on daily Lasix 20 mg along with aldactone. She is known to have normal LV systolic function by echo 2016 and no ischemia on nuclear stress test August 2017. Normal coronary arteries by cath 2009. She had an improvement in  cough and 7 lb weight loss last weekend with higher dose Lasix. She is now back on Lasix 20 mg daily and her weight is back up, her cough has returned. BMET 8 days ago with worsened renal function, creatinine up to 1.5.  -I think she will require a higher dose of Lasix. Will increase Lasix to 40 mg daily every day and continue aldactone. Dry weight may be around 244 pounds and she is still above this.  -Will repeat BMET today and in one week. BNP today.  -She is followed closely by Nephrology. Her renal insufficiency complicates the management of her CHF. I will refer her to the CHF clinic as I think a more intensive approach to her CHF management may prevent repeat hospitalization.   Current medicines are reviewed at length with the patient today.  The patient does not have concerns regarding medicines.  The following changes have been made:  no change  Labs/ tests ordered today include:   Orders Placed This Encounter  Procedures  . Basic Metabolic Panel (BMET)  . B Nat Peptide  . Basic Metabolic Panel (BMET)    Disposition:   FU with me in 12  months  Signed, Lauree Chandler, MD 04/16/2016 3:37 PM    [facility] [place], [place], [place]  [address] Phone: [phone]; Fax: [phone]

## 2016-04-17 ENCOUNTER — Telehealth: Payer: Self-pay | Admitting: Cardiovascular Disease

## 2016-04-17 NOTE — Telephone Encounter (Signed)
PT'S DAUGHTER AWARE OF LAB RESULTS .[name]

## 2016-04-17 NOTE — Telephone Encounter (Signed)
New Message  Pt is returning nurses call from yesterday about lab results.  Please follow up with pt. Thanks!

## 2016-04-22 ENCOUNTER — Telehealth: Payer: Self-pay | Admitting: Cardiovascular Disease

## 2016-04-22 NOTE — Telephone Encounter (Signed)
New Message:     The family wanted Dr Angelena Form to know that [facility] is involved in her [place]. If he need them to monitor or help in any way please contact them at [phone] or [phone] and their fax number is [phone]. Marland Kitchen

## 2016-04-23 ENCOUNTER — Other Ambulatory Visit: Payer: Medicare Other

## 2016-04-24 ENCOUNTER — Telehealth: Payer: Self-pay | Admitting: Cardiovascular Disease

## 2016-04-24 ENCOUNTER — Other Ambulatory Visit: Payer: Medicare Other | Admitting: *Deleted

## 2016-04-24 DIAGNOSIS — I5033 Acute on chronic diastolic (congestive) heart failure: Secondary | ICD-10-CM

## 2016-04-24 LAB — BASIC METABOLIC PANEL
BUN: 39 mg/dL — AB (ref 7–25)
CALCIUM: 10.1 mg/dL (ref 8.6–10.4)
CO2: 30 mmol/L (ref 20–31)
CREATININE: 1.55 mg/dL — AB (ref 0.60–0.93)
Chloride: 101 mmol/L (ref 98–110)
GLUCOSE: 91 mg/dL (ref 65–99)
Potassium: 4.5 mmol/L (ref 3.5–5.3)
Sodium: 140 mmol/L (ref 135–146)

## 2016-04-24 NOTE — Telephone Encounter (Signed)
I would have her increase Lasix to 40 mg BID for next 3 days and let us know next week if her weight does not go down. Thanks, chris

## 2016-04-24 NOTE — Telephone Encounter (Signed)
New message        Calling to let the doctor know that pt has gained 3 lbs since yesterday.  Daughter also states pt has peed twice today but very small amounts.  No sob or chest pain.  Please advise

## 2016-04-24 NOTE — Telephone Encounter (Signed)
Called patient's daughter back about Dr. Camillia Herter message. Per Dr. Angelena Form, increase Lasix 40 mg BID for next 3 days and let us know next week if her weight does not go down. Patient's daughter verbalized understanding.

## 2016-04-24 NOTE — Telephone Encounter (Signed)
SPOKE WITH PT'S DAUGHTER PER PT  3 LB  WEIGHT  GAIN   NOTED NO  COUGH    AT  THIS TIME  CURRENTLY  TAKING  FUROSEMIDE  40 MG   WHICH IS  RECENT   INCREASE  FROM  20 MG    BNP FROM 8-24  WAS  NORMAL AT  29.7  PT  STATES   HAS  URINATED VERY LITTLE TODAY   AND  HAS  WATCHED SALT INTAKE  AS  WELL AS  FLUID  INTAKE  WILL FORWARD   TO DR  Angelena Form  FOR  REVIEW .Adonis Housekeeper

## 2016-04-29 ENCOUNTER — Other Ambulatory Visit: Payer: Self-pay | Admitting: *Deleted

## 2016-04-29 MED ORDER — FUROSEMIDE 40 MG PO TABS: 40.0000 mg | ORAL_TABLET | Freq: Two times a day (BID) | ORAL | 3 refills | Status: DC

## 2016-04-30 ENCOUNTER — Encounter: Payer: Self-pay | Admitting: Cardiovascular Disease

## 2016-05-01 ENCOUNTER — Other Ambulatory Visit: Payer: Medicare Other | Admitting: *Deleted

## 2016-05-01 ENCOUNTER — Telehealth: Payer: Self-pay | Admitting: *Deleted

## 2016-05-01 ENCOUNTER — Other Ambulatory Visit (HOSPITAL_COMMUNITY)
Admit: 2016-05-01 | Discharge: 2016-05-01 | Disposition: A | Discharge status: Home / Self Care | Payer: Medicare Other | Attending: Cardiovascular Disease | Admitting: Cardiovascular Disease

## 2016-05-01 DIAGNOSIS — I5032 Chronic diastolic (congestive) heart failure: Secondary | ICD-10-CM

## 2016-05-01 LAB — BASIC METABOLIC PANEL
ANION GAP: 7 (ref 5–15)
BUN: 44 mg/dL — ABNORMAL HIGH (ref 6–20)
CALCIUM: 10.4 mg/dL — AB (ref 8.9–10.3)
CO2: 32 mmol/L (ref 22–32)
CREATININE: 1.92 mg/dL — AB (ref 0.44–1.00)
Chloride: 101 mmol/L (ref 101–111)
GFR, EST AFRICAN AMERICAN: 29 mL/min — AB (ref >60)
GFR, EST NON AFRICAN AMERICAN: 25 mL/min — AB (ref >60)
Glucose, Bld: 61 mg/dL — ABNORMAL LOW (ref 65–99)
Potassium: 4.4 mmol/L (ref 3.5–5.1)
SODIUM: 140 mmol/L (ref 135–145)

## 2016-05-01 NOTE — Progress Notes (Signed)
Bmp

## 2016-05-01 NOTE — Telephone Encounter (Signed)
Lab results reviewed with Dr. Tamala Julian and instructions given for pt to continue furosemide 40 mg twice daily. Will need BMP on Monday. I spoke with pt's daughter and gave her these instructions. She will bring pt to office for BMP on Monday.

## 2016-05-01 NOTE — Telephone Encounter (Signed)
Lab results received in office. BNP was drawn by home health yesterday instead of BMP. BNP was 58.  I spoke with pt's [place] nurse [phone]). She has checked with lab and they no longer have sample. I spoke with pt's daughter Vicente Males) and she will have someone bring pt to office this afternoon for stat BMP.

## 2016-05-01 NOTE — Addendum Note (Signed)
Addended by: Eulis Foster on: 05/01/2016 02:20 PM   Modules accepted: Orders

## 2016-05-01 NOTE — Telephone Encounter (Signed)
I spoke with pt's daughter. She reports pt's weight has stayed at 253 lbs.  Cough has decreased and shortness of breath is better since Lasix increased. She is urinating more. They have not heard results of urinalysis yet.

## 2016-05-01 NOTE — Addendum Note (Signed)
Addended by: Eulis Foster on: 05/01/2016 12:29 PM   Modules accepted: Orders

## 2016-05-01 NOTE — Addendum Note (Signed)
Addended by: Eulis Foster on: 05/01/2016 03:21 PM   Modules accepted: Orders

## 2016-05-01 NOTE — Addendum Note (Signed)
Addended by: Eulis Foster on: 05/01/2016 02:23 PM   Modules accepted: Orders

## 2016-05-01 NOTE — Addendum Note (Signed)
Addended by: Eulis Foster on: 05/01/2016 02:11 PM   Modules accepted: Orders

## 2016-05-04 ENCOUNTER — Telehealth: Payer: Self-pay | Admitting: Cardiovascular Disease

## 2016-05-04 ENCOUNTER — Other Ambulatory Visit: Payer: Medicare Other | Admitting: *Deleted

## 2016-05-04 ENCOUNTER — Emergency Department (HOSPITAL_COMMUNITY): Payer: Medicare Other

## 2016-05-04 ENCOUNTER — Encounter (INDEPENDENT_AMBULATORY_CARE_PROVIDER_SITE_OTHER): Payer: Self-pay

## 2016-05-04 ENCOUNTER — Encounter (HOSPITAL_COMMUNITY): Payer: Self-pay | Admitting: Emergency Medicine

## 2016-05-04 ENCOUNTER — Emergency Department (HOSPITAL_COMMUNITY)
Admission: EM | Admit: 2016-05-04 | Discharge: 2016-05-04 | Disposition: A | Discharge status: Home / Self Care | Payer: Medicare Other | Attending: Emergency Medicine | Admitting: Emergency Medicine

## 2016-05-04 DIAGNOSIS — I5032 Chronic diastolic (congestive) heart failure: Secondary | ICD-10-CM

## 2016-05-04 DIAGNOSIS — Z8673 Personal history of transient ischemic attack (TIA), and cerebral infarction without residual deficits: Secondary | ICD-10-CM | POA: Insufficient documentation

## 2016-05-04 DIAGNOSIS — I5022 Chronic systolic (congestive) heart failure: Secondary | ICD-10-CM | POA: Diagnosis not present

## 2016-05-04 DIAGNOSIS — Z7982 Long term (current) use of aspirin: Secondary | ICD-10-CM | POA: Diagnosis not present

## 2016-05-04 DIAGNOSIS — I110 Hypertensive heart disease with heart failure: Secondary | ICD-10-CM | POA: Diagnosis not present

## 2016-05-04 DIAGNOSIS — E039 Hypothyroidism, unspecified: Secondary | ICD-10-CM | POA: Insufficient documentation

## 2016-05-04 DIAGNOSIS — R0602 Shortness of breath: Secondary | ICD-10-CM | POA: Diagnosis present

## 2016-05-04 DIAGNOSIS — Z96653 Presence of artificial knee joint, bilateral: Secondary | ICD-10-CM | POA: Insufficient documentation

## 2016-05-04 DIAGNOSIS — J40 Bronchitis, not specified as acute or chronic: Secondary | ICD-10-CM

## 2016-05-04 LAB — BASIC METABOLIC PANEL
Anion gap: 8 (ref 5–15)
BUN: 41 mg/dL — AB (ref 7–25)
BUN: 41 mg/dL — AB (ref 6–20)
CALCIUM: 10.3 mg/dL (ref 8.6–10.4)
CHLORIDE: 100 mmol/L — AB (ref 101–111)
CO2: 30 mmol/L (ref 20–31)
CO2: 29 mmol/L (ref 22–32)
Calcium: 10 mg/dL (ref 8.9–10.3)
Chloride: 101 mmol/L (ref 98–110)
Creat: 1.53 mg/dL — ABNORMAL HIGH (ref 0.60–0.93)
Creatinine, Ser: 1.55 mg/dL — ABNORMAL HIGH (ref 0.44–1.00)
GFR calc Af Amer: 38 mL/min — ABNORMAL LOW (ref >60)
GFR, EST NON AFRICAN AMERICAN: 33 mL/min — AB (ref >60)
GLUCOSE: 98 mg/dL (ref 65–99)
GLUCOSE: 92 mg/dL (ref 65–99)
POTASSIUM: 4.3 mmol/L (ref 3.5–5.3)
POTASSIUM: 4.6 mmol/L (ref 3.5–5.1)
Sodium: 141 mmol/L (ref 135–146)
Sodium: 137 mmol/L (ref 135–145)

## 2016-05-04 LAB — CBC
HEMATOCRIT: 39.8 % (ref 36.0–46.0)
HEMOGLOBIN: 12.5 g/dL (ref 12.0–15.0)
MCH: 30.1 pg (ref 26.0–34.0)
MCHC: 31.4 g/dL (ref 30.0–36.0)
MCV: 95.9 fL (ref 78.0–100.0)
Platelets: 231 10*3/uL (ref 150–400)
RBC: 4.15 MIL/uL (ref 3.87–5.11)
RDW: 17 % — AB (ref 11.5–15.5)
WBC: 8.4 10*3/uL (ref 4.0–10.5)

## 2016-05-04 LAB — I-STAT TROPONIN, ED: Troponin i, poc: 0.01 ng/mL (ref 0.00–0.08)

## 2016-05-04 LAB — BRAIN NATRIURETIC PEPTIDE: B Natriuretic Peptide: 62.5 pg/mL (ref 0.0–100.0)

## 2016-05-04 MED ORDER — BENZONATATE 100 MG PO CAPS
100.0000 mg | ORAL_CAPSULE | Freq: Once | ORAL | Status: AC
  Administered 2016-05-04: 100 mg via ORAL
  Filled 2016-05-04: qty 1

## 2016-05-04 MED ORDER — ALBUTEROL SULFATE HFA 108 (90 BASE) MCG/ACT IN AERS: 1.0000 | INHALATION_SPRAY | Freq: Four times a day (QID) | RESPIRATORY_TRACT | 0 refills | Status: DC | PRN

## 2016-05-04 MED ORDER — PREDNISONE 20 MG PO TABS: ORAL_TABLET | ORAL | 0 refills | Status: DC

## 2016-05-04 MED ORDER — BENZONATATE 100 MG PO CAPS: 100.0000 mg | ORAL_CAPSULE | Freq: Three times a day (TID) | ORAL | 0 refills | Status: DC

## 2016-05-04 MED ORDER — ALBUTEROL SULFATE (2.5 MG/3ML) 0.083% IN NEBU
5.0000 mg | INHALATION_SOLUTION | Freq: Once | RESPIRATORY_TRACT | Status: AC
  Administered 2016-05-04: 5 mg via RESPIRATORY_TRACT
  Filled 2016-05-04: qty 6

## 2016-05-04 MED ORDER — PREDNISONE 20 MG PO TABS
60.0000 mg | ORAL_TABLET | Freq: Once | ORAL | Status: AC
  Administered 2016-05-04: 60 mg via ORAL
  Filled 2016-05-04: qty 3

## 2016-05-04 MED ORDER — IPRATROPIUM BROMIDE 0.02 % IN SOLN
0.5000 mg | Freq: Once | RESPIRATORY_TRACT | Status: AC
  Administered 2016-05-04: 0.5 mg via RESPIRATORY_TRACT
  Filled 2016-05-04: qty 2.5

## 2016-05-04 NOTE — ED Provider Notes (Signed)
Medical screening examination/treatment/procedure(s) were conducted as a shared visit with non-physician practitioner(s) and myself.  I personally evaluated the patient during the encounter.  H/O CHF, on diuretics, howeve this seems more consistent with infectious cause with wheezing and coughing fits. No positional components. On my exam, no e/o fluid overload such as JVD, ascites, LE edema or S3. Labs without elevated BNP. CXR w/o fluid overload.  Will plan to treat as bronchitis. Has close cards follow up and ability to return here quickly if symptoms worsen.    EKG Interpretation  Date/Time:  Monday May 04 2016 16:59:51 EDT Ventricular Rate:  62 PR Interval:  140 QRS Duration: 106 QT Interval:  470 QTC Calculation: 477 R Axis:   -14 Text Interpretation:  Normal sinus rhythm Normal ECG Confirmed by [facility] MD, Corene Cornea [phone]) on 05/04/2016 5:49:55 PM         Merrily Pew, MD 05/05/16 1505

## 2016-05-04 NOTE — ED Notes (Signed)
Ambulated Pt. Pt's initial SpO2 was 98% and pulse 69 beats per minutes. While ambulating, Pt's SpO2 varied between 95-97% and pulse increased to 100 beats per minutes. Pt reported feeling "fine" and "only slightly dizzy."

## 2016-05-04 NOTE — ED Provider Notes (Signed)
[facility] DEPT Provider Note   CSN: [id number] Arrival date & time: 05/04/16  1643     History   Chief Complaint Chief Complaint  Patient presents with  . Respiratory Distress  . Shortness of Breath  . Cough    HPI [name] is a 70 y.o. female.  HPI   70 year old female with history of CHF, anemia, hypertension, anxiety, depression, fibromyalgia, presenting with complaint of shortness of breath. History obtained through daughter who is at bedside. Daughter mentioned the patient was diagnosed with CHF last month. Since then she has been on Lasix. Her primary care doctor is trying to control her CHF with changing of her Lasix dose from 20 mg to 40 mg and recently to 80 mg, last dose changes was 7 days ago. Patient states she has has fluctuation of her weight likely due to fluid retention. Her weight seems to be improving with Lasix however for the past 3-4 days she has had worsening cough productive with clear sputum and increased shortness of breath. Shortness of breath is worsening with coughing. Patient sleeping in a recliner, she has history of sleep apnea, wearing a CPAP at night she denies having new fever, chills, hemoptysis, active chest pain or diaphoresis. No history of active cancer.  Past Medical History:  Diagnosis Date  . Anemia   . Anxiety   . Arthritis   . Brain lesion    2 types  . Cervical spondylosis with myelopathy   . Chest pain    Normal cardiac cath 5/09  . Depression   . Dumping syndrome   . Edema   . Fatigue   . Fibromyalgia   . History of echocardiogram    a. Echo 03/20/16 (done at [facility] in [place], [place]):  mild LVH, EF [id number], normal diastolic function, mild LAE, MAC, RVSP 25 mmHg  . Hyperlipidemia   . Hypertension   . Hypothyroidism   . Lumbar spondylosis   . Lymphedema    seeing specialist for this  . Restless leg syndrome   . Rotator cuff injury   . Stroke [facility])    TIAs "mini strokes"- unsure of last TIA  . Syncope   .  Tremors of nervous system     Patient Active Problem List   Diagnosis Date Noted  . Chronic diastolic CHF (congestive heart failure) ([place]) 03/29/2016  . Normal coronary arteries 2009 01/14/2015  . Obesity-BMI 40 01/14/2015  . Restless leg 01/14/2015  . Chest pain 01/14/2015  . Back pain 01/14/2015  . Renal insufficiency 01/14/2015  . Dementia- mild memory issues 01/14/2015  . Occult blood positive stool 12/14/2014  . Anemia 12/14/2014  . Family history of colon cancer 12/14/2014  . Change in bowel habits 12/14/2014  . GERD (gastroesophageal reflux disease) 12/14/2014  . Dyspnea 05/17/2012  . Lymphedema 05/17/2012  . Weight gain 05/17/2012  . HTN (hypertension) 05/17/2012    Past Surgical History:  Procedure Laterality Date  . APPENDECTOMY  1966  . CHOLECYSTECTOMY  2015  . CHOLECYSTECTOMY  2015  . CORONARY ANGIOGRAM  2009   Normal coronaries  . Hip Bursa Surgery Bilateral   . KNEE ARTHROSCOPY Bilateral   . LUMBAR DISC SURGERY     x2  . LUMBAR FUSION    . ROTATOR CUFF REPAIR Bilateral   . TOTAL ABDOMINAL HYSTERECTOMY    . TOTAL KNEE ARTHROPLASTY Left   . TOTAL KNEE ARTHROPLASTY Right 11/18/2012   Procedure: TOTAL KNEE ARTHROPLASTY;  Surgeon: Yvette Rack., MD;  Location:  [facility] OR;  Service: Orthopedics;  Laterality: Right;    OB History    No data available       Home Medications    Prior to Admission medications   Medication Sig Start Date End Date Taking? Authorizing Provider  alendronate (FOSAMAX) 70 MG tablet Take 70 mg by mouth once a week. Take with a full glass of water on an empty stomach.    Historical Provider, MD  amLODipine (NORVASC) 5 MG tablet Take 5 mg by mouth Daily.  04/28/12   Historical Provider, MD  aspirin 81 MG tablet Take 81 mg by mouth at bedtime.    Historical Provider, MD  baclofen (LIORESAL) 10 MG tablet Take 10 mg by mouth 3 (three) times daily.    Historical Provider, MD  donepezil (ARICEPT) 10 MG tablet Take 1 tablet by mouth daily.  11/28/14   Historical Provider, MD  escitalopram (LEXAPRO) 20 MG tablet Take 1.5 tablets by mouth daily with supper. 12/06/14   Historical Provider, MD  fentaNYL (DURAGESIC - DOSED MCG/HR) 25 MCG/HR patch Place 1 patch onto the skin every 3 (three) days.  07/22/15   Historical Provider, MD  folic acid (FOLVITE) 1 MG tablet Take 1 mg by mouth 2 (two) times daily.    Historical Provider, MD  furosemide (LASIX) 40 MG tablet Take 1 tablet (40 mg total) by mouth 2 (two) times daily. 04/29/16 07/28/16  Burnell Blanks, MD  lamoTRIgine (LAMICTAL) 100 MG tablet Take 1 tablet by mouth 2 (two) times daily. 11/03/14   Historical Provider, MD  levothyroxine (SYNTHROID) 75 MCG tablet Take 75 mcg by mouth daily before breakfast.  11/21/15 11/20/16  Historical Provider, MD  Magnesium 250 MG TABS Take 2 tablets by mouth 2 (two) times daily.    Historical Provider, MD  omeprazole (PRILOSEC) 40 MG capsule Take 1 capsule by mouth daily. 12/04/14   Historical Provider, MD  oxyCODONE-acetaminophen (PERCOCET) 7.5-325 MG per tablet Take 1 tablet by mouth. Every 4-6 hours as needed  10/22/14   Historical Provider, MD  pramipexole (MIRAPEX) 0.25 MG tablet Take 0.5 mg by mouth at bedtime and may repeat dose one time if needed.     Historical Provider, MD  pravastatin (PRAVACHOL) 80 MG tablet Take 1 tablet by mouth daily. 12/10/14   Historical Provider, MD  spironolactone (ALDACTONE) 25 MG tablet Take 25 mg by mouth Twice daily. 05/12/12   Historical Provider, MD  Vitamin D, Ergocalciferol, (DRISDOL) 50000 UNITS CAPS Take 50,000 Units by mouth every 30 (thirty) days. Patient takes this on first Friday of the month    Historical Provider, MD    Family History Family History  Problem Relation Age of Onset  . Heart disease Mother   . Heart failure Mother   . Kidney disease Mother   . Lung cancer Father   . Colon cancer Brother   . Cancer Brother     malignant thymoma  . Heart disease Brother   . Tremor Maternal Grandmother  90  . Ovarian cancer Maternal Grandmother   . Uterine cancer Maternal Grandmother   . Irritable bowel syndrome Daughter   . Esophageal cancer Neg Hx   . Stomach cancer Neg Hx   . Rectal cancer Neg Hx     Social History Social History  Substance Use Topics  . Smoking status: Never Smoker  . Smokeless tobacco: Never Used  . Alcohol use No     Allergies   Oxycodone and Hydromorphone   Review of Systems Review of  Systems  All other systems reviewed and are negative.    Physical Exam Updated Vital Signs BP (!) 128/50 (BP Location: Right Arm)   Pulse 95   Temp 98.3 F (36.8 C) (Oral)   Resp 23   Ht 5\' 6"  (1.676 m)   Wt 115.2 kg   SpO2 96%   BMI 41.00 kg/m   Physical Exam  Constitutional: She appears well-developed and well-nourished. No distress.  Obese female laying in bed, actively coughing.  HENT:  Head: Atraumatic.  Eyes: Conjunctivae are normal.  Neck: Neck supple. No JVD present.  Cardiovascular: Normal rate, regular rhythm and intact distal pulses.   Pulmonary/Chest: Effort normal and breath sounds normal.  Faint Scattered rhonchi but no wheezes, or rales heard  Abdominal: She exhibits distension. There is no tenderness.  Musculoskeletal: She exhibits edema (2+ nonpitting edema to bilateral lower extremities).  Neurological: She is alert.  Skin: No rash noted.  Psychiatric: She has a normal mood and affect.  Nursing note and vitals reviewed.    ED Treatments / Results  Labs (all labs ordered are listed, but only abnormal results are displayed) Labs Reviewed  BASIC METABOLIC PANEL - Abnormal; Notable for the following:       Result Value   Chloride 100 (*)    BUN 41 (*)    Creatinine, Ser 1.55 (*)    GFR calc non Af Amer 33 (*)    GFR calc Af Amer 38 (*)    All other components within normal limits  CBC - Abnormal; Notable for the following:    RDW 17.0 (*)    All other components within normal limits  BRAIN NATRIURETIC PEPTIDE  I-STAT  TROPOININ, ED    EKG  EKG Interpretation  Date/Time:  Monday May 04 2016 16:59:51 EDT Ventricular Rate:  62 PR Interval:  140 QRS Duration: 106 QT Interval:  470 QTC Calculation: 477 R Axis:   -14 Text Interpretation:  Normal sinus rhythm Normal ECG Confirmed by MESNER MD, Corene Cornea [phone]) on 05/04/2016 5:49:55 PM       Radiology Dg Chest 2 View  Result Date: 05/04/2016 CLINICAL DATA:  70 year old female with history of coughing congestion since Friday. History of congestive heart failure and hypertension. EXAM: CHEST  2 VIEW COMPARISON:  Chest x-ray 03/27/2016. FINDINGS: Lung volumes are normal. Diffuse peribronchial cuffing. No consolidative airspace disease. No pleural effusions. No pneumothorax. No pulmonary nodule or mass noted. Pulmonary vasculature and the cardiomediastinal silhouette are within normal limits. Atherosclerosis in the thoracic aorta. Surgical clips project over the right upper quadrant of the abdomen, likely from prior cholecystectomy. IMPRESSION: 1. Mild diffuse peribronchial cuffing, concerning for an acute bronchitis. 2. Atherosclerosis. Electronically Signed   By: Vinnie Langton M.D.   On: 05/04/2016 18:10    Procedures Procedures (including critical care time)  Medications Ordered in ED Medications  benzonatate (TESSALON) capsule 100 mg (100 mg Oral Given 05/04/16 1832)  albuterol (PROVENTIL) (2.5 MG/3ML) 0.083% nebulizer solution 5 mg (5 mg Nebulization Given 05/04/16 1832)  ipratropium (ATROVENT) nebulizer solution 0.5 mg (0.5 mg Nebulization Given 05/04/16 1832)  predniSONE (DELTASONE) tablet 60 mg (60 mg Oral Given 05/04/16 1832)     Initial Impression / Assessment and Plan / ED Course  I have reviewed the triage vital signs and the nursing notes.  Pertinent labs & imaging results that were available during my care of the patient were reviewed by me and considered in my medical decision making (see chart for details).  Clinical Course  BP  (!) 118/53   Pulse (!) 52   Temp 98.3 F (36.8 C) (Oral)   Resp 14   Ht 5\' 6"  (1.676 m)   Wt 115.2 kg   SpO2 98%   BMI 41.00 kg/m    Final Clinical Impressions(s) / ED Diagnoses   Final diagnoses:  Bronchitis    New Prescriptions New Prescriptions   ALBUTEROL (PROVENTIL HFA;VENTOLIN HFA) 108 (90 BASE) MCG/ACT INHALER    Inhale 1-2 puffs into the lungs every 6 (six) hours as needed for wheezing or shortness of breath.   BENZONATATE (TESSALON) 100 MG CAPSULE    Take 1 capsule (100 mg total) by mouth every 8 (eight) hours.   PREDNISONE (DELTASONE) 20 MG TABLET    2 tabs po daily x 4 days   6:23 PM Patient with history of CHF currently on Lasix. She is here due to worsening cough and shortness of breath. She does not appears to be fluid overload however is difficult to fully examine due to large body habitus. A chest x-ray shows evidence suggestive of acute bronchitis but no pleural effusion or edema concerning for CHF exacerbation.  7:00 PM Patient has normal BNP. EKG and troponin is normal as well. Her labs are otherwise reassuring. I have low suspicion for ACS at this time. Sxs not consistent with PE. I suspect her symptoms is likely secondary to bronchitis. Patient will receive a breathing treatment here, along with cough medication. She is not a smoker therefore I do not think antibiotic is indicated in this scenario. Recommend follow-up with PCP for further management.  Care discussed with DR. Mesner.    7:41 PM Pt ambulating while maintaining adequate O2 above 90% on RA.  Pt is stable to be discharged with cough medication, steroid, rescue inhaler and f/u with PCP for further care.  Return precaution discussed.     Domenic Moras, PA-C 05/04/16 1946    Merrily Pew, MD 05/05/16 [phone]

## 2016-05-04 NOTE — ED Notes (Signed)
ED PA at bedside

## 2016-05-04 NOTE — ED Notes (Signed)
PT's daughter removed all of PT's rings and placed them on her hand due to swelling

## 2016-05-04 NOTE — Telephone Encounter (Signed)
New message    Pt daughter called verbalized that mother has persistent cough

## 2016-05-04 NOTE — ED Triage Notes (Signed)
Pt here with acute onset cough with resp distress and weak non productive cough. Pt speaking in short sentences. Airway intact. Pt reports symptoms started at 1400. Pt has CHF

## 2016-05-04 NOTE — ED Notes (Signed)
10 days ago PT's MD increased her Lasix from 40 mg to 80 mg.

## 2016-05-04 NOTE — Discharge Instructions (Signed)
Use albuterol inhaler 2 puffs every 4 hours as needed for your shortness of breath. Take cough medication as needed and use steroid course for the next 4 days.

## 2016-05-04 NOTE — Telephone Encounter (Signed)
LM TO CALL BACK ./CY 

## 2016-05-05 NOTE — Telephone Encounter (Signed)
I spoke with pt's daughter, Vicente Males.  Pt's daughter states because of persistent cough yesterday, she took her mother to [facility] ED for further evaluation.  Pt's daughter states they were told cough was probably due to bronchitis, gave pt breathing treatment, steroids, and other medication, was advised to follow up with PCP.

## 2016-05-05 NOTE — Telephone Encounter (Signed)
BMP was repeated while the pt was in the ER yesterday.

## 2016-05-05 NOTE — Telephone Encounter (Signed)
Pt's daughter states pt's symptoms have improved today and pt is better.  Pt's daughter states she was told they did not think pt had extra fluid and did not adjust lasix dose from current dose of 40mg  bid.   Pt's daughter aware pt has a follow up appt in the [facility] 05/08/16. Pt's daughter states HHN due to visit pt today.  Pt's daughter advised to continue to monitor and record daily weights. Pt's daughter advised I will forward to Dr Angelena Form for review.

## 2016-05-05 NOTE — Telephone Encounter (Signed)
Copied from notes attached to lab done 05/04/16:  Notes Recorded by Burnell Blanks, MD on 05/05/2016 at 11:01 AM EDT Renal function stable. Continue Lasix 40 mg BID. She has an appt in CHF clinic Friday. Can we let her know? She was in the ED yesterday and diagnosed with bronchitis so they may have reviewed her BMET already with her. Gerald Stabs  05/05/16--LMTCB for pt's daughter, [name] to let her know Dr Camillia Herter recommendations.

## 2016-05-08 ENCOUNTER — Ambulatory Visit (HOSPITAL_COMMUNITY)
Admission: RE | Admit: 2016-05-08 | Discharge: 2016-05-08 | Disposition: A | Discharge status: Home / Self Care | Payer: Medicare Other | Source: Ambulatory Visit | Attending: Internal Medicine | Admitting: Internal Medicine

## 2016-05-08 VITALS — BP 142/64 | HR 79 | Ht 66.0 in | Wt 255.0 lb

## 2016-05-08 DIAGNOSIS — M797 Fibromyalgia: Secondary | ICD-10-CM | POA: Diagnosis not present

## 2016-05-08 DIAGNOSIS — N183 Chronic kidney disease, stage 3 unspecified: Secondary | ICD-10-CM

## 2016-05-08 DIAGNOSIS — I130 Hypertensive heart and chronic kidney disease with heart failure and stage 1 through stage 4 chronic kidney disease, or unspecified chronic kidney disease: Secondary | ICD-10-CM | POA: Diagnosis present

## 2016-05-08 DIAGNOSIS — R079 Chest pain, unspecified: Secondary | ICD-10-CM | POA: Diagnosis not present

## 2016-05-08 DIAGNOSIS — I890 Lymphedema, not elsewhere classified: Secondary | ICD-10-CM | POA: Diagnosis not present

## 2016-05-08 DIAGNOSIS — F0390 Unspecified dementia without behavioral disturbance: Secondary | ICD-10-CM | POA: Insufficient documentation

## 2016-05-08 DIAGNOSIS — Z79899 Other long term (current) drug therapy: Secondary | ICD-10-CM | POA: Diagnosis not present

## 2016-05-08 DIAGNOSIS — Z7982 Long term (current) use of aspirin: Secondary | ICD-10-CM | POA: Diagnosis not present

## 2016-05-08 DIAGNOSIS — I5032 Chronic diastolic (congestive) heart failure: Secondary | ICD-10-CM | POA: Insufficient documentation

## 2016-05-08 MED ORDER — FUROSEMIDE 40 MG PO TABS: ORAL_TABLET | ORAL | 6 refills | Status: DC

## 2016-05-08 NOTE — Progress Notes (Signed)
Advanced Heart Failure Clinic Note   Primary Care: Dr. Edrick Oh Primary Cardiologist: Dr. Angelena Form Renal: Dr. Lowanda Foster HF Cardiology: Dr. Aundra Dubin  HPI:  [name] is a 70 y.o. with history of chronic diastolic CHF (Echo 99991111 LVEF 65%, Mild LAE), chronic lymphedema, chronic back pain, HTN, CKD stage 3, and fibromyalgia.  Recently established with  Dr Angelena Form and was referred to the HF clinic for med optimization.    Admitted several times in 02/2016 to [facility] with CHF and chest pain.  Has been tried on spironolactone + metolazone alone as diuretic regimen. Follows with renal as above.   Last seen in [facility] clinic 04/16/16. Thought to have mild volume overload. Lasix dose increased to 40 mg daily.  Recently treated by the ER with prednisone for acute bronchitis.   She presents today to establish with the HF clinic.  Not very mobile.  Short of breath after walking about 100 feet, this is gradually worsening.  +Orthopnea, props up head at night.  She has atypical chest pain occasionally, Imdur has helped.    ECG: NSR, normal  Labs (9/17): K 4.6, creatinine 1.55, BNP 63  Past Medical History 1. Chronic diastolic CHF - Echo [id number] LVEF 65%, Mild LAE - Normal coronaries on Cath 2009 - Stress Myoview 04/09/16 with no ischemia 2. CKD stage 3: Follows with nephology. Baseline appears to be creatinine 1.4-1.6.  3. Chronic lymphedema: Has had wraps.  4. HTN 5. Dementia 5. Fibromyalgia  FH: Mother with CHF, brother with "heart disease."  Another brother with h/o MI.   Current Outpatient Prescriptions  Medication Sig Dispense Refill  . albuterol (PROVENTIL HFA;VENTOLIN HFA) 108 (90 Base) MCG/ACT inhaler Inhale 1-2 puffs into the lungs every 6 (six) hours as needed for wheezing or shortness of breath. 1 Inhaler 0  . alendronate (FOSAMAX) 70 MG tablet Take 70 mg by mouth once a week. Take with a full glass of water on an empty stomach.    Marland Kitchen amLODipine (NORVASC) 5 MG tablet  Take 5 mg by mouth Daily.     Marland Kitchen aspirin 81 MG tablet Take 81 mg by mouth at bedtime.    . baclofen (LIORESAL) 10 MG tablet Take 10 mg by mouth 3 (three) times daily.    . benzonatate (TESSALON) 100 MG capsule Take 1 capsule (100 mg total) by mouth every 8 (eight) hours. 21 capsule 0  . ciprofloxacin (CIPRO) 250 MG tablet Take 250 mg by mouth 2 (two) times daily.    Marland Kitchen donepezil (ARICEPT) 10 MG tablet Take 1 tablet by mouth daily.    Marland Kitchen escitalopram (LEXAPRO) 20 MG tablet Take 30 mg by mouth daily with supper.     . fentaNYL (DURAGESIC - DOSED MCG/HR) 25 MCG/HR patch Place 1 patch onto the skin See admin instructions. EVERY 48 HOURS    . folic acid (FOLVITE) 1 MG tablet Take 1 mg by mouth 2 (two) times daily.    . furosemide (LASIX) 40 MG tablet Take 80 mg by mouth daily.    . isosorbide mononitrate (IMDUR) 30 MG 24 hr tablet Take 30 mg by mouth daily.    Marland Kitchen lamoTRIgine (LAMICTAL) 100 MG tablet Take 1 tablet by mouth 2 (two) times daily.    Marland Kitchen levothyroxine (SYNTHROID) 75 MCG tablet Take 75 mcg by mouth daily before breakfast.     . Magnesium 250 MG TABS Take 250 mg by mouth 2 (two) times daily.     . memantine (NAMENDA) 10 MG tablet Take 10 mg  by mouth 2 (two) times daily.    Marland Kitchen omeprazole (PRILOSEC) 40 MG capsule Take 1 capsule by mouth daily.    Marland Kitchen oxyCODONE-acetaminophen (PERCOCET/ROXICET) 5-325 MG tablet Take 0.5-1 tablets by mouth daily as needed for moderate pain.    . pramipexole (MIRAPEX) 0.5 MG tablet Take 0.5 mg by mouth See admin instructions. 0.5 mg at bedtime and may repeat dose one time if needed    . pravastatin (PRAVACHOL) 80 MG tablet Take 1 tablet by mouth daily.    . predniSONE (DELTASONE) 20 MG tablet 2 tabs po daily x 4 days 8 tablet 0  . spironolactone (ALDACTONE) 25 MG tablet Take 25 mg by mouth Twice daily.    . Vitamin D, Ergocalciferol, (DRISDOL) 50000 UNITS CAPS Take 50,000 Units by mouth every 30 (thirty) days. Patient takes this on first Friday of the month     No  current facility-administered medications for this encounter.     Allergies  Allergen Reactions  . Oxycodone Itching and Hives    Other reaction(s): Delusions (intolerance)  . Hydromorphone Other (See Comments)    Hallucinations      Social History   Social History  . Marital status: Widowed    Spouse name: N/A  . Number of children: 3  . Years of education: N/A   Occupational History  . retired    Social History Main Topics  . Smoking status: Never Smoker  . Smokeless tobacco: Never Used  . Alcohol use No  . Drug use: No  . Sexual activity: Not Currently   Other Topics Concern  . Not on file   Social History Narrative   Pt does use caffeine. Lives alone. 3 children. Retired.    Vitals:   05/08/16 1412  BP: (!) 142/64  Pulse: 79  SpO2: 93%  Weight: 255 lb (115.7 kg)  Height: 5\' 6"  (1.676 m)   Wt Readings from Last 3 Encounters:  05/08/16 255 lb (115.7 kg)  05/04/16 254 lb (115.2 kg)  04/16/16 255 lb 6.4 oz (115.8 kg)    PHYSICAL EXAM: General: NAD Neck: JVP 8-9 cm, no thyromegaly or thyroid nodule.  Lungs: Clear to auscultation bilaterally with normal respiratory effort. CV: Nondisplaced PMI.  Heart regular S1/S2, no S3/S4, no murmur.  Bilateral leg lymphedema.  No carotid bruit.  Normal pedal pulses.  Abdomen: Soft, nontender, no hepatosplenomegaly, no distention.  Skin: Intact without lesions or rashes.  Neurologic: Alert and oriented x 3.  Psych: Normal affect. Extremities: No clubbing or cyanosis.  HEENT: Normal.   ASSESSMENT & PLAN: 1. Chronic diastolic CHF: Several admissions this year with CHF.  On exam, she does have mild volume overload.  NYHA class III.  Exam complicated by lymphedema.  - Increase Lasix to 80 qam, 40 qpm.  BMET/BNP in 1 week.  - May need RHC if not improved.  2. Lymphedema: Would benefit from wraps, home care has been doing this.  3. CKD: Check BMET after Lasix change. She would like a referral to [facility], I will  make.  4. 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  Negative cath 2009 and Cardiolite 8/17.  Imdur helps, continue.   Loralie Champagne 05/10/2016

## 2016-05-08 NOTE — Patient Instructions (Signed)
INCREASE Lasix to 80 mg (2 tabs) in am and 40 mg (1 tab) in pm.  Labs in 1 week.  Follow up 3-4 weeks with Dr. Aundra Dubin.  Do the following things EVERYDAY: 1) Weigh yourself in the morning before breakfast. Write it down and keep it in a log. 2) Take your medicines as prescribed 3) Eat low salt foods-Limit salt (sodium) to 2000 mg per day.  4) Stay as active as you can everyday 5) Limit all fluids for the day to less than 2 liters

## 2016-05-11 ENCOUNTER — Telehealth (HOSPITAL_COMMUNITY): Payer: Self-pay | Admitting: *Deleted

## 2016-05-11 ENCOUNTER — Encounter (HOSPITAL_COMMUNITY): Payer: Self-pay

## 2016-05-11 NOTE — Progress Notes (Signed)
Referral sent per Dr. Aundra Dubin orders to [facility] with requested/supporting documentation. 30 total pages faxed to provided # [phone] Referral form placed in pending referrals folder in CHF clinic for follow up.  Renee Pain, RN

## 2016-05-11 NOTE — Telephone Encounter (Signed)
Angie with [facility] called and stated patient told her Dr.McLean wanted her legs wrapped and not to use compression stockings. Angie needs and order as well as specific directions (ex. How often should they be changed? Duration?)  Message sent to Dr.McLean

## 2016-05-11 NOTE — Telephone Encounter (Signed)
Orders given from Dr.McLeanto wrap legs weekly until next appointment. Angie with [facility] aware.

## 2016-05-12 NOTE — Progress Notes (Signed)
Received fax from [facility], pt has been rated a #4 and will be scheduled an appt within 1 month.

## 2016-05-14 ENCOUNTER — Telehealth (HOSPITAL_COMMUNITY): Payer: Self-pay

## 2016-05-14 NOTE — Telephone Encounter (Signed)
Patient called to report [facility] unable to draw ordered labs due to staffing constraints. Advised to go to [facility] to have labs drawn 9patient has paper Rx). Aware and agreeable.  Renee Pain, RN

## 2016-05-15 ENCOUNTER — Telehealth (HOSPITAL_COMMUNITY): Payer: Self-pay | Admitting: *Deleted

## 2016-05-15 ENCOUNTER — Other Ambulatory Visit (HOSPITAL_COMMUNITY)
Admission: RE | Admit: 2016-05-15 | Discharge: 2016-05-15 | Disposition: A | Discharge status: Home / Self Care | Payer: Medicare Other | Source: Ambulatory Visit | Attending: Cardiology | Admitting: Cardiology

## 2016-05-15 DIAGNOSIS — I5032 Chronic diastolic (congestive) heart failure: Secondary | ICD-10-CM

## 2016-05-15 DIAGNOSIS — I5022 Chronic systolic (congestive) heart failure: Secondary | ICD-10-CM | POA: Diagnosis present

## 2016-05-15 LAB — BASIC METABOLIC PANEL
ANION GAP: 8 (ref 5–15)
BUN: 40 mg/dL — ABNORMAL HIGH (ref 6–20)
CALCIUM: 9.8 mg/dL (ref 8.9–10.3)
CO2: 30 mmol/L (ref 22–32)
Chloride: 100 mmol/L — ABNORMAL LOW (ref 101–111)
Creatinine, Ser: 1.89 mg/dL — ABNORMAL HIGH (ref 0.44–1.00)
GFR, EST AFRICAN AMERICAN: 30 mL/min — AB (ref >60)
GFR, EST NON AFRICAN AMERICAN: 26 mL/min — AB (ref >60)
Glucose, Bld: 101 mg/dL — ABNORMAL HIGH (ref 65–99)
Potassium: 4 mmol/L (ref 3.5–5.1)
Sodium: 138 mmol/L (ref 135–145)

## 2016-05-15 LAB — BRAIN NATRIURETIC PEPTIDE: B NATRIURETIC PEPTIDE 5: 45 pg/mL (ref 0.0–100.0)

## 2016-05-15 NOTE — Telephone Encounter (Signed)
Notes Recorded by Scarlette Calico, RN on 05/15/2016 at 1:59 PM EDT Patient aware. And daugher, she will come for repeat labs 9/29

## 2016-05-15 NOTE — Telephone Encounter (Signed)
-----   Message from Larey Dresser, MD sent at 05/15/2016  1:32 PM EDT ----- Creatinine mildly higher.  No change but repeat BMET 10 days.

## 2016-05-18 ENCOUNTER — Other Ambulatory Visit (HOSPITAL_COMMUNITY): Payer: Medicare Other

## 2016-05-21 ENCOUNTER — Inpatient Hospital Stay (HOSPITAL_COMMUNITY)
Admission: EM | Admit: 2016-05-21 | Discharge: 2016-05-25 | DRG: 287 | Disposition: A | Discharge status: Home Health | Payer: Medicare Other | Attending: Cardiovascular Disease | Admitting: Cardiovascular Disease

## 2016-05-21 ENCOUNTER — Encounter (HOSPITAL_COMMUNITY): Payer: Self-pay | Admitting: Emergency Medicine

## 2016-05-21 ENCOUNTER — Emergency Department (HOSPITAL_COMMUNITY): Payer: Medicare Other

## 2016-05-21 DIAGNOSIS — I890 Lymphedema, not elsewhere classified: Secondary | ICD-10-CM | POA: Diagnosis present

## 2016-05-21 DIAGNOSIS — I5032 Chronic diastolic (congestive) heart failure: Secondary | ICD-10-CM | POA: Diagnosis present

## 2016-05-21 DIAGNOSIS — I25118 Atherosclerotic heart disease of native coronary artery with other forms of angina pectoris: Secondary | ICD-10-CM | POA: Diagnosis not present

## 2016-05-21 DIAGNOSIS — G8929 Other chronic pain: Secondary | ICD-10-CM | POA: Diagnosis present

## 2016-05-21 DIAGNOSIS — Z79899 Other long term (current) drug therapy: Secondary | ICD-10-CM

## 2016-05-21 DIAGNOSIS — N179 Acute kidney failure, unspecified: Secondary | ICD-10-CM | POA: Diagnosis present

## 2016-05-21 DIAGNOSIS — G2581 Restless legs syndrome: Secondary | ICD-10-CM | POA: Diagnosis present

## 2016-05-21 DIAGNOSIS — M549 Dorsalgia, unspecified: Secondary | ICD-10-CM | POA: Diagnosis present

## 2016-05-21 DIAGNOSIS — F419 Anxiety disorder, unspecified: Secondary | ICD-10-CM | POA: Diagnosis present

## 2016-05-21 DIAGNOSIS — E869 Volume depletion, unspecified: Secondary | ICD-10-CM | POA: Diagnosis present

## 2016-05-21 DIAGNOSIS — I509 Heart failure, unspecified: Secondary | ICD-10-CM | POA: Diagnosis not present

## 2016-05-21 DIAGNOSIS — Z90710 Acquired absence of both cervix and uterus: Secondary | ICD-10-CM | POA: Diagnosis not present

## 2016-05-21 DIAGNOSIS — Z9049 Acquired absence of other specified parts of digestive tract: Secondary | ICD-10-CM

## 2016-05-21 DIAGNOSIS — F329 Major depressive disorder, single episode, unspecified: Secondary | ICD-10-CM | POA: Diagnosis present

## 2016-05-21 DIAGNOSIS — Z7982 Long term (current) use of aspirin: Secondary | ICD-10-CM

## 2016-05-21 DIAGNOSIS — R079 Chest pain, unspecified: Secondary | ICD-10-CM

## 2016-05-21 DIAGNOSIS — Z96653 Presence of artificial knee joint, bilateral: Secondary | ICD-10-CM | POA: Diagnosis present

## 2016-05-21 DIAGNOSIS — Z8673 Personal history of transient ischemic attack (TIA), and cerebral infarction without residual deficits: Secondary | ICD-10-CM

## 2016-05-21 DIAGNOSIS — Z8249 Family history of ischemic heart disease and other diseases of the circulatory system: Secondary | ICD-10-CM | POA: Diagnosis not present

## 2016-05-21 DIAGNOSIS — I200 Unstable angina: Secondary | ICD-10-CM | POA: Diagnosis present

## 2016-05-21 DIAGNOSIS — M797 Fibromyalgia: Secondary | ICD-10-CM | POA: Diagnosis present

## 2016-05-21 DIAGNOSIS — E785 Hyperlipidemia, unspecified: Secondary | ICD-10-CM | POA: Diagnosis present

## 2016-05-21 DIAGNOSIS — N183 Chronic kidney disease, stage 3 (moderate): Secondary | ICD-10-CM | POA: Diagnosis present

## 2016-05-21 DIAGNOSIS — I130 Hypertensive heart and chronic kidney disease with heart failure and stage 1 through stage 4 chronic kidney disease, or unspecified chronic kidney disease: Secondary | ICD-10-CM | POA: Diagnosis present

## 2016-05-21 DIAGNOSIS — Z841 Family history of disorders of kidney and ureter: Secondary | ICD-10-CM

## 2016-05-21 DIAGNOSIS — Z885 Allergy status to narcotic agent status: Secondary | ICD-10-CM | POA: Diagnosis not present

## 2016-05-21 DIAGNOSIS — E039 Hypothyroidism, unspecified: Secondary | ICD-10-CM | POA: Diagnosis present

## 2016-05-21 LAB — COMPREHENSIVE METABOLIC PANEL
ALBUMIN: 4.3 g/dL (ref 3.5–5.0)
ALT: 31 U/L (ref 14–54)
ANION GAP: 11 (ref 5–15)
AST: 40 U/L (ref 15–41)
Alkaline Phosphatase: 92 U/L (ref 38–126)
BUN: 57 mg/dL — ABNORMAL HIGH (ref 6–20)
CHLORIDE: 95 mmol/L — AB (ref 101–111)
CO2: 30 mmol/L (ref 22–32)
Calcium: 10.2 mg/dL (ref 8.9–10.3)
Creatinine, Ser: 2.11 mg/dL — ABNORMAL HIGH (ref 0.44–1.00)
GFR calc non Af Amer: 22 mL/min — ABNORMAL LOW (ref >60)
GFR, EST AFRICAN AMERICAN: 26 mL/min — AB (ref >60)
GLUCOSE: 109 mg/dL — AB (ref 65–99)
Potassium: 4.6 mmol/L (ref 3.5–5.1)
SODIUM: 136 mmol/L (ref 135–145)
Total Bilirubin: 0.8 mg/dL (ref 0.3–1.2)
Total Protein: 7.8 g/dL (ref 6.5–8.1)

## 2016-05-21 LAB — CBC
HEMATOCRIT: 39.4 % (ref 36.0–46.0)
HEMOGLOBIN: 12.8 g/dL (ref 12.0–15.0)
MCH: 30.7 pg (ref 26.0–34.0)
MCHC: 32.5 g/dL (ref 30.0–36.0)
MCV: 94.5 fL (ref 78.0–100.0)
PLATELETS: 216 10*3/uL (ref 150–400)
RBC: 4.17 MIL/uL (ref 3.87–5.11)
RDW: 15.9 % — ABNORMAL HIGH (ref 11.5–15.5)
WBC: 8.1 10*3/uL (ref 4.0–10.5)

## 2016-05-21 LAB — I-STAT TROPONIN, ED
TROPONIN I, POC: 0.02 ng/mL (ref 0.00–0.08)
TROPONIN I, POC: 0.01 ng/mL (ref 0.00–0.08)

## 2016-05-21 LAB — MRSA PCR SCREENING: MRSA by PCR: NEGATIVE

## 2016-05-21 LAB — BRAIN NATRIURETIC PEPTIDE: B Natriuretic Peptide: 25.1 pg/mL (ref 0.0–100.0)

## 2016-05-21 LAB — TROPONIN I: Troponin I: <0.03 ng/mL (ref <0.03)

## 2016-05-21 MED ORDER — HEPARIN SODIUM (PORCINE) 5000 UNIT/ML IJ SOLN
5000.0000 [IU] | Freq: Three times a day (TID) | INTRAMUSCULAR | Status: DC
  Administered 2016-05-21 – 2016-05-24 (×10): 5000 [IU] via SUBCUTANEOUS
  Filled 2016-05-21 (×10): qty 1

## 2016-05-21 MED ORDER — ALUM & MAG HYDROXIDE-SIMETH 200-200-20 MG/5ML PO SUSP
30.0000 mL | Freq: Once | ORAL | Status: AC
  Administered 2016-05-21: 30 mL via ORAL
  Filled 2016-05-21: qty 30

## 2016-05-21 MED ORDER — OXYCODONE-ACETAMINOPHEN 5-325 MG PO TABS
0.5000 | ORAL_TABLET | Freq: Every day | ORAL | Status: DC | PRN
  Administered 2016-05-21 – 2016-05-22 (×2): 1 via ORAL
  Filled 2016-05-21 (×2): qty 1

## 2016-05-21 MED ORDER — BACLOFEN 10 MG PO TABS
10.0000 mg | ORAL_TABLET | Freq: Three times a day (TID) | ORAL | Status: DC
  Administered 2016-05-21 – 2016-05-25 (×11): 10 mg via ORAL
  Filled 2016-05-21 (×11): qty 1

## 2016-05-21 MED ORDER — LAMOTRIGINE 100 MG PO TABS
100.0000 mg | ORAL_TABLET | Freq: Two times a day (BID) | ORAL | Status: DC
Start: 2016-05-21 — End: 2016-05-25
  Administered 2016-05-21 – 2016-05-25 (×8): 100 mg via ORAL
  Filled 2016-05-21 (×8): qty 1

## 2016-05-21 MED ORDER — ALBUTEROL SULFATE (2.5 MG/3ML) 0.083% IN NEBU: 3.0000 mL | INHALATION_SOLUTION | Freq: Four times a day (QID) | RESPIRATORY_TRACT | Status: DC | PRN

## 2016-05-21 MED ORDER — ESCITALOPRAM OXALATE 10 MG PO TABS
30.0000 mg | ORAL_TABLET | Freq: Every day | ORAL | Status: DC
  Administered 2016-05-22 – 2016-05-24 (×3): 30 mg via ORAL
  Filled 2016-05-21 (×3): qty 3

## 2016-05-21 MED ORDER — FOLIC ACID 1 MG PO TABS
1.0000 mg | ORAL_TABLET | Freq: Two times a day (BID) | ORAL | Status: DC
  Administered 2016-05-21 – 2016-05-25 (×8): 1 mg via ORAL
  Filled 2016-05-21 (×8): qty 1

## 2016-05-21 MED ORDER — BENZONATATE 100 MG PO CAPS
100.0000 mg | ORAL_CAPSULE | Freq: Three times a day (TID) | ORAL | Status: DC | PRN
  Administered 2016-05-21 – 2016-05-25 (×2): 100 mg via ORAL
  Filled 2016-05-21 (×2): qty 1

## 2016-05-21 MED ORDER — PRAVASTATIN SODIUM 40 MG PO TABS
80.0000 mg | ORAL_TABLET | Freq: Every day | ORAL | Status: DC
  Administered 2016-05-21 – 2016-05-24 (×4): 80 mg via ORAL
  Filled 2016-05-21 (×5): qty 2

## 2016-05-21 MED ORDER — ONDANSETRON HCL 4 MG/2ML IJ SOLN: 4.0000 mg | Freq: Four times a day (QID) | INTRAMUSCULAR | Status: DC | PRN

## 2016-05-21 MED ORDER — ASPIRIN EC 81 MG PO TBEC
81.0000 mg | DELAYED_RELEASE_TABLET | Freq: Every day | ORAL | Status: DC
Start: 2016-05-21 — End: 2016-05-25
  Administered 2016-05-21 – 2016-05-24 (×4): 81 mg via ORAL
  Filled 2016-05-21 (×4): qty 1

## 2016-05-21 MED ORDER — DONEPEZIL HCL 10 MG PO TABS
10.0000 mg | ORAL_TABLET | Freq: Every day | ORAL | Status: DC
  Administered 2016-05-22 – 2016-05-25 (×4): 10 mg via ORAL
  Filled 2016-05-21 (×4): qty 1

## 2016-05-21 MED ORDER — AMLODIPINE BESYLATE 5 MG PO TABS
5.0000 mg | ORAL_TABLET | Freq: Every day | ORAL | Status: DC
  Administered 2016-05-22 – 2016-05-25 (×4): 5 mg via ORAL
  Filled 2016-05-21 (×4): qty 1

## 2016-05-21 MED ORDER — SODIUM CHLORIDE 0.9 % IV SOLN
INTRAVENOUS | Status: AC
  Administered 2016-05-21: 50 mL/h via INTRAVENOUS

## 2016-05-21 MED ORDER — FENTANYL 25 MCG/HR TD PT72
25.0000 ug | MEDICATED_PATCH | TRANSDERMAL | Status: DC
  Administered 2016-05-22 – 2016-05-24 (×2): 25 ug via TRANSDERMAL
  Filled 2016-05-21 (×2): qty 1

## 2016-05-21 MED ORDER — PANTOPRAZOLE SODIUM 40 MG PO TBEC
40.0000 mg | DELAYED_RELEASE_TABLET | Freq: Every day | ORAL | Status: DC
  Administered 2016-05-22 – 2016-05-25 (×4): 40 mg via ORAL
  Filled 2016-05-21 (×4): qty 1

## 2016-05-21 MED ORDER — ACETAMINOPHEN 325 MG PO TABS
650.0000 mg | ORAL_TABLET | ORAL | Status: DC | PRN
  Administered 2016-05-22 – 2016-05-25 (×4): 650 mg via ORAL
  Filled 2016-05-21 (×4): qty 2

## 2016-05-21 MED ORDER — PRAMIPEXOLE DIHYDROCHLORIDE 1 MG PO TABS
0.5000 mg | ORAL_TABLET | Freq: Every day | ORAL | Status: DC
  Administered 2016-05-21 – 2016-05-24 (×4): 0.5 mg via ORAL
  Filled 2016-05-21 (×4): qty 1

## 2016-05-21 MED ORDER — MAGNESIUM OXIDE 400 (241.3 MG) MG PO TABS
200.0000 mg | ORAL_TABLET | Freq: Two times a day (BID) | ORAL | Status: DC
  Administered 2016-05-21 – 2016-05-25 (×8): 200 mg via ORAL
  Filled 2016-05-21 (×8): qty 1

## 2016-05-21 MED ORDER — MAGNESIUM 200 MG PO TABS
250.0000 mg | ORAL_TABLET | Freq: Two times a day (BID) | ORAL | Status: DC
  Filled 2016-05-21: qty 2

## 2016-05-21 MED ORDER — LEVOTHYROXINE SODIUM 75 MCG PO TABS
75.0000 ug | ORAL_TABLET | Freq: Every day | ORAL | Status: DC
  Administered 2016-05-22 – 2016-05-25 (×4): 75 ug via ORAL
  Filled 2016-05-21 (×4): qty 1

## 2016-05-21 MED ORDER — ASPIRIN 81 MG PO CHEW
324.0000 mg | CHEWABLE_TABLET | Freq: Once | ORAL | Status: DC
Start: 2016-05-21 — End: 2016-05-21

## 2016-05-21 MED ORDER — MEMANTINE HCL 5 MG PO TABS
10.0000 mg | ORAL_TABLET | Freq: Two times a day (BID) | ORAL | Status: DC
  Administered 2016-05-21 – 2016-05-25 (×8): 10 mg via ORAL
  Filled 2016-05-21 (×8): qty 2

## 2016-05-21 MED ORDER — NITROGLYCERIN IN D5W 200-5 MCG/ML-% IV SOLN: 2.0000 ug/min | INTRAVENOUS | Status: DC

## 2016-05-21 MED ORDER — NITROGLYCERIN 0.4 MG SL SUBL
0.4000 mg | SUBLINGUAL_TABLET | SUBLINGUAL | Status: DC | PRN
  Administered 2016-05-21 (×2): 0.4 mg via SUBLINGUAL
  Filled 2016-05-21: qty 1

## 2016-05-21 NOTE — ED Notes (Signed)
MD at bedside. 

## 2016-05-21 NOTE — H&P (Signed)
ADMISSION HISTORY AND PHYSICAL   Date: 05/21/2016               Patient Name:  [name] MRN: [id number]  DOB: 02-25-1946 Age / Sex: 70 y.o., female        PCP: Sherrie Mustache Primary Cardiologist: Aundra Dubin          History of Present Illness: Patient is a 70 y.o. female with a PMHx of chronic diastolic CHF (Echo [id number] LVEF 65%, Mild LAE), chronic lymphedema, chronic back pain, HTN, CKD stage 3, and fibromyalgia, who was admitted to [facility] on 05/21/2016 for evaluation of chest pain .   This [facility] / right chest , then radiated and spread to left side Not pleuretic Worse if she gets up and walks around, not worsened with twising torso, not worsened with eating and drinking  Has had a dry cough all day .   Thinks her pain iw worse with a cough  Works with a physical therapist .   Has weak legs since a hospitalization. Walks with a walker .  She used to go to the pool 3 days a week.  Last cath was many years ago with Dr. Martinique. Has been told that she would likely need another cath including  A right heart cath   Has had these chest pain for several years.  Relieved with Imdur , does not have regular NTG at home . NTG herer in the ER helped CP  Non smoker, non drinker Family hx - mother and grandmother had CHF , father had CHF Brothers died of massive MI.   Has chronic lymphedema   Hx of fibromyalgia - says that she does not sleep well at all   Has CKD - creatinine is 2.11, BUN is elevated Takes lasix 80 BID and metalazone 2.5 mg for 3 days straight and then holds for several days    Medications: Outpatient medications:  (Not in a hospital admission)  Allergies  Allergen Reactions  . Oxycodone Itching and Hives    Other reaction(s): Delusions (intolerance)  . Hydromorphone Other (See Comments)    Hallucinations     Past Medical History:  Diagnosis Date  . Anemia   . Anxiety   . Arthritis   . Brain lesion    2 types  .  Cervical spondylosis with myelopathy   . Chest pain    Normal cardiac cath 5/09  . Depression   . Dumping syndrome   . Edema   . Fatigue   . Fibromyalgia   . History of echocardiogram    a. Echo 03/20/16 (done at [facility] in [place], [place]):  mild LVH, EF 123456, normal diastolic function, mild LAE, MAC, RVSP 25 mmHg  . Hyperlipidemia   . Hypertension   . Hypothyroidism   . Lumbar spondylosis   . Lymphedema    seeing specialist for this  . Restless leg syndrome   . Rotator cuff injury   . Stroke [facility])    TIAs "mini strokes"- unsure of last TIA  . Syncope   . Tremors of nervous system     Past Surgical History:  Procedure Laterality Date  . APPENDECTOMY  1966  . CHOLECYSTECTOMY  2015  . CHOLECYSTECTOMY  2015  . CORONARY ANGIOGRAM  2009   Normal coronaries  . Hip Bursa Surgery Bilateral   . KNEE ARTHROSCOPY Bilateral   . LUMBAR DISC SURGERY     x2  . LUMBAR FUSION    .  ROTATOR CUFF REPAIR Bilateral   . TOTAL ABDOMINAL HYSTERECTOMY    . TOTAL KNEE ARTHROPLASTY Left   . TOTAL KNEE ARTHROPLASTY Right 11/18/2012   Procedure: TOTAL KNEE ARTHROPLASTY;  Surgeon: Yvette Rack., MD;  Location: [employer];  Service: Orthopedics;  Laterality: Right;    Family History  Problem Relation Age of Onset  . Heart disease Mother   . Heart failure Mother   . Kidney disease Mother   . Lung cancer Father   . Colon cancer Brother   . Cancer Brother     malignant thymoma  . Heart disease Brother   . Tremor Maternal Grandmother 90  . Ovarian cancer Maternal Grandmother   . Uterine cancer Maternal Grandmother   . Irritable bowel syndrome Daughter   . Esophageal cancer Neg Hx   . Stomach cancer Neg Hx   . Rectal cancer Neg Hx     Social History:  reports that she has never smoked. She has never used smokeless tobacco. She reports that she does not drink alcohol or use drugs.   Review of Systems: Constitutional:  denies fever, chills, diaphoresis, appetite change and fatigue.  HEENT:  denies photophobia, eye pain, redness, hearing loss, ear pain, congestion, sore throat, rhinorrhea, sneezing, neck pain, neck stiffness and tinnitus.  Respiratory: denies SOB, DOE, cough, chest tightness, and wheezing.  Cardiovascular: denies chest pain, palpitations and leg swelling.  Gastrointestinal: denies nausea, vomiting, abdominal pain, diarrhea, constipation, blood in stool.  Genitourinary: denies dysuria, urgency, frequency, hematuria, flank pain and difficulty urinating.  Musculoskeletal: denies  myalgias, back pain, joint swelling, arthralgias and gait problem.   Skin: denies pallor, rash and wound.  Neurological: denies dizziness, seizures, syncope, weakness, light-headedness, numbness and headaches.   Hematological: denies adenopathy, easy bruising, personal or family bleeding history.  Psychiatric/ Behavioral: denies suicidal ideation, mood changes, confusion, nervousness, sleep disturbance and agitation.    Physical Exam: BP (!) 126/53   Pulse (!) 58   Temp 98.2 F (36.8 C) (Oral)   Resp 16   Ht 5\' 6"  (1.676 m)   Wt 257 lb (116.6 kg)   SpO2 92%   BMI 41.48 kg/m   Wt Readings from Last 3 Encounters:  05/21/16 257 lb (116.6 kg)  05/08/16 255 lb (115.7 kg)  05/04/16 254 lb (115.2 kg)    General: Vital signs reviewed and noted. Well-developed, well-nourished, in no acute distress; alert,   Head: Normocephalic, atraumatic, sclera anicteric, mucus membranes are moist   Neck: Supple. Negative for carotid bruits. JVD not elevated.   Lungs:  Clear bilaterally to auscultation without wheezes, rales, or rhonchi. Breathing is normal   Heart: RRR with S1 S2. No murmurs, rubs, or gallops.   Abdomen:  Soft, non-tender, non-distended with normoactive bowel sounds. No hepatomegaly. No rebound/guarding. No obvious abdominal masses   MSK: Strength and the appear normal for age.   Extremities: Chronic lymphedema , very poor muscle tone .  very mild pitting edema  Neurologic: Alert  and oriented X 3. Moves all extremities spontaneously   Psych:  normal    Lab results: Basic Metabolic Panel:  Recent Labs Lab 05/15/16 1012 05/21/16 1241  NA 138 136  K 4.0 4.6  CL 100* 95*  CO2 30 30  GLUCOSE 101* 109*  BUN 40* 57*  CREATININE 1.89* 2.11*  CALCIUM 9.8 10.2    Liver Function Tests:  Recent Labs Lab 05/21/16 1241  AST 40  ALT 31  ALKPHOS 92  BILITOT 0.8  PROT 7.8  ALBUMIN 4.3   No results for input(s): LIPASE, AMYLASE in the last 168 hours.  CBC:  Recent Labs Lab 05/21/16 1241  WBC 8.1  HGB 12.8  HCT 39.4  MCV 94.5  PLT 216    Cardiac Enzymes: No results for input(s): CKTOTAL, CKMB, CKMBINDEX, TROPONINI in the last 168 hours.  BNP: Invalid input(s): POCBNP  CBG: No results for input(s): GLUCAP in the last 168 hours.  Coagulation Studies: No results for input(s): LABPROT, INR in the last 72 hours.   Other results:  EKG  - NSR . No ST or T wave changes   Imaging: Dg Chest 2 View  Result Date: 05/21/2016 CLINICAL DATA:  70 year old with acute onset of chest pain at 10:30 a.m. today. Current history of hypertension and CHF. EXAM: CHEST  2 VIEW COMPARISON:  05/04/2016, 03/27/2016 and earlier, including CTA chest 03/19/2016. FINDINGS: Cardiac silhouette mildly enlarged, unchanged dating back to May, 2016. Thoracic aorta atherosclerotic, unchanged. Hilar and mediastinal contours otherwise unremarkable. Pulmonary venous hypertension without overt edema currently. Lungs clear. No pleural effusions. No pneumothorax. Mild degenerative changes involving the thoracic spine. Osseous demineralization. IMPRESSION: 1. Stable mild cardiomegaly. Pulmonary venous hypertension without overt edema. No acute cardiopulmonary disease. 2. Thoracic aortic atherosclerosis. Electronically Signed   By: Evangeline Dakin M.D.   On: 05/21/2016 13:51      Last Echo    Last myoview 04/09/16  Nuclear stress EF: 57%.  The left ventricular ejection fraction  is normal (55-65%).  There was no ST segment deviation noted during stress.  Defect 1: There is a small defect of moderate severity present in the apical inferior and apex location. Consistent with prior myocardial infarction.  This is a low risk study    Assessment & Plan:  1. Chest pain :   There has been thoughts that she has microvascular angiana. myuoview was unremarkable. She will likely need a cath - also a right cath Will need to have her renal function in better shape before we do a cath   Has ongoing CP , relieved with NTG  Will start her on low dose NTG drip  2. CKD - stage III:  May need gentle hydration tonight - her BUN and creatinine are elevated.   3. Diastlic chf:   Takes very high does diuretics.   I think she is intravascularly volume depleted - ( BUN and Cr are elevatde )  Will try gentle hydration - IV NS at 50 cc / hr for 500 cc.   4. Fibromyalgia:   Admits that she does not sleep well.  I think that the lack of sleep is causing these fiobromyalgia symptoms   5.     DVT PPX - compression hose    Ramond Dial., MD, [facility] 05/21/2016, 4:39 PM

## 2016-05-21 NOTE — ED Notes (Signed)
Fenatyl patch to the left shoulder.

## 2016-05-21 NOTE — Progress Notes (Signed)
Assisted Dr. Acie Fredrickson with admit orders - per his recommendation, hold diuretics at discretion for CHF team to restart in AM if appropriate, gentle IVF at 50 cc/hr for total 500 cc, hold off heparin unless enzymes positive. Elham Fini PA-C

## 2016-05-21 NOTE — ED Notes (Signed)
Before pt transferred form EMS stretcher to ED stretcher she sated that she had to urinate very badly. Once pt was moved to ED stretcher this NT placed pt on bedpan, and pt was then unable to urinate. Explained to the pt that if she felt the urgency again, we would try and place back on bedpan.

## 2016-05-21 NOTE — ED Notes (Signed)
Cardiology MD at bedside.

## 2016-05-21 NOTE — ED Provider Notes (Signed)
[place] DEPT Provider Note   CSN: [id number] Arrival date & time: 05/21/16  1155     History   Chief Complaint Chief Complaint  Patient presents with  . Chest Pain    HPI [name] is a 70 y.o. female.  The history is provided by the patient.  Chest Pain   The current episode started 1 to 2 hours ago. The problem occurs constantly. The problem has not changed since onset.The pain is present in the lateral region and substernal region. The pain is moderate. The quality of the pain is described as pressure-like. The pain radiates to the left arm. Associated symptoms include cough, lower extremity edema, malaise/fatigue and shortness of breath. Pertinent negatives include no abdominal pain, no back pain, no fever, no headaches, no nausea, no palpitations and no vomiting.  Her past medical history is significant for CHF, hyperlipidemia and hypertension.  Pertinent negatives for past medical history include no diabetes, no DVT, no MI and no seizures.    Past Medical History:  Diagnosis Date  . Anemia   . Anxiety   . Arthritis   . Brain lesion    2 types  . Cervical spondylosis with myelopathy   . Chest pain    Normal cardiac cath 5/09  . Depression   . Dumping syndrome   . Edema   . Fatigue   . Fibromyalgia   . History of echocardiogram    a. Echo 03/20/16 (done at [facility] in [place], [place]):  mild LVH, EF 123456, normal diastolic function, mild LAE, MAC, RVSP 25 mmHg  . Hyperlipidemia   . Hypertension   . Hypothyroidism   . Lumbar spondylosis   . Lymphedema    seeing specialist for this  . Restless leg syndrome   . Rotator cuff injury   . Stroke [facility])    TIAs "mini strokes"- unsure of last TIA  . Syncope   . Tremors of nervous system     Patient Active Problem List   Diagnosis Date Noted  . Chronic diastolic CHF (congestive heart failure) ([place]) 03/29/2016  . Normal coronary arteries 2009 01/14/2015  . Obesity-BMI 40 01/14/2015  . Restless leg  01/14/2015  . Chest pain 01/14/2015  . Back pain 01/14/2015  . Renal insufficiency 01/14/2015  . Dementia- mild memory issues 01/14/2015  . Occult blood positive stool 12/14/2014  . Anemia 12/14/2014  . Family history of colon cancer 12/14/2014  . Change in bowel habits 12/14/2014  . GERD (gastroesophageal reflux disease) 12/14/2014  . Dyspnea 05/17/2012  . Lymphedema 05/17/2012  . Weight gain 05/17/2012  . HTN (hypertension) 05/17/2012    Past Surgical History:  Procedure Laterality Date  . APPENDECTOMY  1966  . CHOLECYSTECTOMY  2015  . CHOLECYSTECTOMY  2015  . CORONARY ANGIOGRAM  2009   Normal coronaries  . Hip Bursa Surgery Bilateral   . KNEE ARTHROSCOPY Bilateral   . LUMBAR DISC SURGERY     x2  . LUMBAR FUSION    . ROTATOR CUFF REPAIR Bilateral   . TOTAL ABDOMINAL HYSTERECTOMY    . TOTAL KNEE ARTHROPLASTY Left   . TOTAL KNEE ARTHROPLASTY Right 11/18/2012   Procedure: TOTAL KNEE ARTHROPLASTY;  Surgeon: Yvette Rack., MD;  Location: [facility];  Service: Orthopedics;  Laterality: Right;    OB History    No data available       Home Medications    Prior to Admission medications   Medication Sig Start Date End Date Taking? Authorizing Provider  albuterol (PROVENTIL HFA;VENTOLIN HFA) 108 (90 Base) MCG/ACT inhaler Inhale 1-2 puffs into the lungs every 6 (six) hours as needed for wheezing or shortness of breath. 05/04/16   Domenic Moras, PA-C  alendronate (FOSAMAX) 70 MG tablet Take 70 mg by mouth once a week. Take with a full glass of water on an empty stomach.    Historical Provider, MD  amLODipine (NORVASC) 5 MG tablet Take 5 mg by mouth Daily.  04/28/12   Historical Provider, MD  aspirin 81 MG tablet Take 81 mg by mouth at bedtime.    Historical Provider, MD  baclofen (LIORESAL) 10 MG tablet Take 10 mg by mouth 3 (three) times daily.    Historical Provider, MD  benzonatate (TESSALON) 100 MG capsule Take 1 capsule (100 mg total) by mouth every 8 (eight) hours. 05/04/16   Domenic Moras, PA-C  donepezil (ARICEPT) 10 MG tablet Take 1 tablet by mouth daily. 11/28/14   Historical Provider, MD  escitalopram (LEXAPRO) 20 MG tablet Take 30 mg by mouth daily with supper.  12/06/14   Historical Provider, MD  fentaNYL (DURAGESIC - DOSED MCG/HR) 25 MCG/HR patch Place 1 patch onto the skin See admin instructions. EVERY 48 HOURS 07/22/15   Historical Provider, MD  folic acid (FOLVITE) 1 MG tablet Take 1 mg by mouth 2 (two) times daily.    Historical Provider, MD  furosemide (LASIX) 40 MG tablet Take 80 mg (2 tabs) in am and 40 mg (1 tab) in pm 05/08/16   Larey Dresser, MD  isosorbide mononitrate (IMDUR) 30 MG 24 hr tablet Take 30 mg by mouth daily. 03/28/16   Historical Provider, MD  lamoTRIgine (LAMICTAL) 100 MG tablet Take 1 tablet by mouth 2 (two) times daily. 11/03/14   Historical Provider, MD  levothyroxine (SYNTHROID) 75 MCG tablet Take 75 mcg by mouth daily before breakfast.  11/21/15 11/20/16  Historical Provider, MD  Magnesium 250 MG TABS Take 250 mg by mouth 2 (two) times daily.     Historical Provider, MD  memantine (NAMENDA) 10 MG tablet Take 10 mg by mouth 2 (two) times daily. 04/06/16   Historical Provider, MD  omeprazole (PRILOSEC) 40 MG capsule Take 1 capsule by mouth daily. 12/04/14   Historical Provider, MD  oxyCODONE-acetaminophen (PERCOCET/ROXICET) 5-325 MG tablet Take 0.5-1 tablets by mouth daily as needed for moderate pain.    Historical Provider, MD  pramipexole (MIRAPEX) 0.5 MG tablet Take 0.5 mg by mouth See admin instructions. 0.5 mg at bedtime and may repeat dose one time if needed 04/06/16   Historical Provider, MD  pravastatin (PRAVACHOL) 80 MG tablet Take 1 tablet by mouth daily. 12/10/14   Historical Provider, MD  predniSONE (DELTASONE) 20 MG tablet 2 tabs po daily x 4 days 05/04/16   Domenic Moras, PA-C  spironolactone (ALDACTONE) 25 MG tablet Take 25 mg by mouth Twice daily. 05/12/12   Historical Provider, MD  Vitamin D, Ergocalciferol, (DRISDOL) 50000 UNITS CAPS Take  50,000 Units by mouth every 30 (thirty) days. Patient takes this on first Friday of the month    Historical Provider, MD    Family History Family History  Problem Relation Age of Onset  . Heart disease Mother   . Heart failure Mother   . Kidney disease Mother   . Lung cancer Father   . Colon cancer Brother   . Cancer Brother     malignant thymoma  . Heart disease Brother   . Tremor Maternal Grandmother 90  . Ovarian cancer Maternal Grandmother   .  Uterine cancer Maternal Grandmother   . Irritable bowel syndrome Daughter   . Esophageal cancer Neg Hx   . Stomach cancer Neg Hx   . Rectal cancer Neg Hx     Social History Social History  Substance Use Topics  . Smoking status: Never Smoker  . Smokeless tobacco: Never Used  . Alcohol use No     Allergies   Oxycodone and Hydromorphone   Review of Systems Review of Systems  Constitutional: Positive for fatigue and malaise/fatigue. Negative for chills and fever.  HENT: Negative for ear pain and sore throat.   Eyes: Negative for pain and visual disturbance.  Respiratory: Positive for cough and shortness of breath.   Cardiovascular: Positive for chest pain. Negative for palpitations.  Gastrointestinal: Negative for abdominal pain, nausea and vomiting.  Genitourinary: Negative for dysuria and hematuria.  Musculoskeletal: Negative for arthralgias and back pain.  Skin: Negative for color change and rash.  Neurological: Negative for seizures, syncope and headaches.  All other systems reviewed and are negative.    Physical Exam Updated Vital Signs BP (!) 133/45 (BP Location: Right Arm)   Pulse 61   Temp 98.2 F (36.8 C) (Oral)   Resp 13   Ht 5\' 6"  (1.676 m)   Wt 257 lb (116.6 kg)   SpO2 94%   BMI 41.48 kg/m   Physical Exam  Constitutional: She is oriented to person, place, and time. She appears well-developed and well-nourished. No distress.  HENT:  Head: Normocephalic and atraumatic.  Nose: Nose normal.  Eyes:  Conjunctivae and EOM are normal. Pupils are equal, round, and reactive to light. Right eye exhibits no discharge. Left eye exhibits no discharge. No scleral icterus.  Neck: Normal range of motion. Neck supple.  Cardiovascular: Normal rate and regular rhythm.  Exam reveals no gallop and no friction rub.   No murmur heard. Pulmonary/Chest: Effort normal and breath sounds normal. No stridor. No respiratory distress. She has no rales.  Abdominal: Soft. She exhibits no distension. There is no tenderness.  Musculoskeletal: She exhibits no edema or tenderness.  Neurological: She is alert and oriented to person, place, and time.  Skin: Skin is warm and dry. No rash noted. She is not diaphoretic. No erythema.  Psychiatric: She has a normal mood and affect.  Vitals reviewed.    ED Treatments / Results  Labs (all labs ordered are listed, but only abnormal results are displayed) Labs Reviewed  CBC - Abnormal; Notable for the following:       Result Value   RDW 15.9 (*)    All other components within normal limits  COMPREHENSIVE METABOLIC PANEL - Abnormal; Notable for the following:    Chloride 95 (*)    Glucose, Bld 109 (*)    BUN 57 (*)    Creatinine, Ser 2.11 (*)    GFR calc non Af Amer 22 (*)    GFR calc Af Amer 26 (*)    All other components within normal limits  BRAIN NATRIURETIC PEPTIDE  I-STAT TROPOININ, ED  I-STAT TROPOININ, ED    EKG  EKG Interpretation  Date/Time:  Thursday May 21 2016 11:59:05 EDT Ventricular Rate:  60 PR Interval:    QRS Duration: 110 QT Interval:  475 QTC Calculation: 475 R Axis:   -23 Text Interpretation:  Sinus rhythm Borderline left axis deviation No significant change since last tracing Confirmed by [facility] MD, PEDRO [id number]) on 05/21/2016 1:32:43 PM       Radiology Dg Chest 2 View  Result Date: 05/21/2016 CLINICAL DATA:  70 year old with acute onset of chest pain at 10:30 a.m. today. Current history of hypertension and CHF. EXAM: CHEST   2 VIEW COMPARISON:  05/04/2016, 03/27/2016 and earlier, including CTA chest 03/19/2016. FINDINGS: Cardiac silhouette mildly enlarged, unchanged dating back to May, 2016. Thoracic aorta atherosclerotic, unchanged. Hilar and mediastinal contours otherwise unremarkable. Pulmonary venous hypertension without overt edema currently. Lungs clear. No pleural effusions. No pneumothorax. Mild degenerative changes involving the thoracic spine. Osseous demineralization. IMPRESSION: 1. Stable mild cardiomegaly. Pulmonary venous hypertension without overt edema. No acute cardiopulmonary disease. 2. Thoracic aortic atherosclerosis. Electronically Signed   By: Evangeline Dakin M.D.   On: 05/21/2016 13:51    Procedures Procedures (including critical care time)  Medications Ordered in ED Medications  nitroGLYCERIN (NITROSTAT) SL tablet 0.4 mg (0.4 mg Sublingual Given 05/21/16 1638)  alum & mag hydroxide-simeth (MAALOX/MYLANTA) 200-200-20 MG/5ML suspension 30 mL (30 mLs Oral Given 05/21/16 1638)     Initial Impression / Assessment and Plan / ED Course  I have reviewed the triage vital signs and the nursing notes.  Pertinent labs & imaging results that were available during my care of the patient were reviewed by me and considered in my medical decision making (see chart for details).  Clinical Course    Anginal chest pain. EKG without acute changes. Chest x-ray without evidence suggestive of pneumonia, pneumothorax, pneumomediastinum.  No abnormal contour of the mediastinum to suggest dissection. No evidence of acute injuries. Initial troponin negative.  Upon review of records patient had a recent negative stress test last month. Feel the patient would be appropriate for the second troponin. That's negative for cancer safely rule out ACS. Discussed the case with cardiology who seems to be in agreement however will come and see the patient while in the ED and give further recommendations as needed.  Low suspicion  for pulmonary embolism. Presentation is classic for dissection or esophageal perforation.  On workup patient noted to have AKI which is likely secondary to patient's Lasix and metolazone use versus dehydration.  Discussed case with cardiology who will admit the patient.  Final Clinical Impressions(s) / ED Diagnoses   Final diagnoses:  Chest pain  AKI (acute kidney injury) ([place])    Disposition: Admit  Condition: Stable    Fatima Blank, MD 05/21/16 1714

## 2016-05-21 NOTE — ED Notes (Signed)
Patient transported to X-ray 

## 2016-05-21 NOTE — ED Triage Notes (Signed)
Pt with [facility] sudden onset chest pain since this morning. Generalized in nature. Denies SOB or radiation. Also reports back pain but states she has rhodes in her back. Alert and Oriented. 12 lead NSR.   147/76 60-70 SR 97% ra

## 2016-05-22 ENCOUNTER — Other Ambulatory Visit (HOSPITAL_COMMUNITY): Payer: Medicare Other

## 2016-05-22 DIAGNOSIS — N179 Acute kidney failure, unspecified: Secondary | ICD-10-CM

## 2016-05-22 DIAGNOSIS — I5032 Chronic diastolic (congestive) heart failure: Secondary | ICD-10-CM

## 2016-05-22 LAB — CBC
HCT: 38 % (ref 36.0–46.0)
Hemoglobin: 12 g/dL (ref 12.0–15.0)
MCH: 30.3 pg (ref 26.0–34.0)
MCHC: 31.6 g/dL (ref 30.0–36.0)
MCV: 96 fL (ref 78.0–100.0)
Platelets: 209 10*3/uL (ref 150–400)
RBC: 3.96 MIL/uL (ref 3.87–5.11)
RDW: 15.9 % — ABNORMAL HIGH (ref 11.5–15.5)
WBC: 6 10*3/uL (ref 4.0–10.5)

## 2016-05-22 LAB — TROPONIN I: Troponin I: <0.03 ng/mL (ref <0.03)

## 2016-05-22 LAB — BASIC METABOLIC PANEL
ANION GAP: 9 (ref 5–15)
BUN: 41 mg/dL — AB (ref 6–20)
CALCIUM: 9.9 mg/dL (ref 8.9–10.3)
CO2: 30 mmol/L (ref 22–32)
Chloride: 98 mmol/L — ABNORMAL LOW (ref 101–111)
Creatinine, Ser: 1.85 mg/dL — ABNORMAL HIGH (ref 0.44–1.00)
GFR calc Af Amer: 30 mL/min — ABNORMAL LOW (ref >60)
GFR, EST NON AFRICAN AMERICAN: 26 mL/min — AB (ref >60)
Glucose, Bld: 93 mg/dL (ref 65–99)
POTASSIUM: 3.8 mmol/L (ref 3.5–5.1)
SODIUM: 137 mmol/L (ref 135–145)

## 2016-05-22 LAB — LIPID PANEL
CHOL/HDL RATIO: 2.9 ratio
CHOLESTEROL: 157 mg/dL (ref 0–200)
HDL: 55 mg/dL (ref >40)
LDL Cholesterol: 86 mg/dL (ref 0–99)
Triglycerides: 81 mg/dL (ref <150)
VLDL: 16 mg/dL (ref 0–40)

## 2016-05-22 MED ORDER — FUROSEMIDE 80 MG PO TABS
80.0000 mg | ORAL_TABLET | Freq: Every day | ORAL | Status: DC
  Administered 2016-05-23 – 2016-05-24 (×2): 80 mg via ORAL
  Filled 2016-05-22 (×3): qty 1

## 2016-05-22 MED ORDER — OXYCODONE-ACETAMINOPHEN 5-325 MG PO TABS
0.5000 | ORAL_TABLET | Freq: Two times a day (BID) | ORAL | Status: DC | PRN
  Administered 2016-05-23 – 2016-05-24 (×4): 1 via ORAL
  Filled 2016-05-22 (×5): qty 1

## 2016-05-22 MED ORDER — FUROSEMIDE 40 MG PO TABS
40.0000 mg | ORAL_TABLET | Freq: Every evening | ORAL | Status: DC
  Administered 2016-05-23: 40 mg via ORAL
  Filled 2016-05-22: qty 1

## 2016-05-22 MED ORDER — ISOSORBIDE MONONITRATE ER 30 MG PO TB24
30.0000 mg | ORAL_TABLET | Freq: Every day | ORAL | Status: DC
  Administered 2016-05-22 – 2016-05-25 (×4): 30 mg via ORAL
  Filled 2016-05-22 (×4): qty 1

## 2016-05-22 NOTE — Progress Notes (Signed)
Patient ID: [name], female   DOB: 03-26-1946, 70 y.o.   MRN: [id number]   SUBJECTIVE: Chest pain resolved, troponin negative.  She feels back to baseline this morning.  Says her legs are really small for her.   Scheduled Meds: . amLODipine  5 mg Oral Daily  . aspirin EC  81 mg Oral QHS  . baclofen  10 mg Oral TID  . donepezil  10 mg Oral Daily  . escitalopram  30 mg Oral Q supper  . fentaNYL  25 mcg Transdermal Q48H  . folic acid  1 mg Oral BID  . [START ON 05/23/2016] furosemide  40 mg Oral QPM  . [START ON 05/23/2016] furosemide  80 mg Oral QPC breakfast  . heparin  5,000 Units Subcutaneous Q8H  . isosorbide mononitrate  30 mg Oral Daily  . lamoTRIgine  100 mg Oral BID  . levothyroxine  75 mcg Oral QAC breakfast  . magnesium oxide  200 mg Oral BID  . memantine  10 mg Oral BID  . pantoprazole  40 mg Oral Daily  . pramipexole  0.5 mg Oral QHS  . pravastatin  80 mg Oral QHS   Continuous Infusions: . nitroGLYCERIN     PRN Meds:.acetaminophen, albuterol, benzonatate, nitroGLYCERIN, ondansetron (ZOFRAN) IV, oxyCODONE-acetaminophen    Vitals:   05/21/16 2028 05/22/16 0021 05/22/16 0338 05/22/16 0826  BP: (!) 131/53 (!) 105/50 (!) 134/59 (!) 118/52  Pulse: 64 61 (!) 55 (!) 58  Resp: 16 13 11 14   Temp: 98.2 F (36.8 C) 98.2 F (36.8 C) 98.1 F (36.7 C) 97.7 F (36.5 C)  TempSrc: Oral Oral Oral Oral  SpO2: 94% 94% 95% 94%  Weight:   251 lb 1.6 oz (113.9 kg)   Height:        Intake/Output Summary (Last 24 hours) at 05/22/16 0840 Last data filed at 05/22/16 0631  Gross per 24 hour  Intake                0 ml  Output              950 ml  Net             -950 ml    LABS: Basic Metabolic Panel:  Recent Labs  05/21/16 1241  NA 136  K 4.6  CL 95*  CO2 30  GLUCOSE 109*  BUN 57*  CREATININE 2.11*  CALCIUM 10.2   Liver Function Tests:  Recent Labs  05/21/16 1241  AST 40  ALT 31  ALKPHOS 92  BILITOT 0.8  PROT 7.8  ALBUMIN 4.3   No results for  input(s): LIPASE, AMYLASE in the last 72 hours. CBC:  Recent Labs  05/21/16 1241 05/22/16 0708  WBC 8.1 6.0  HGB 12.8 12.0  HCT 39.4 38.0  MCV 94.5 96.0  PLT 216 209   Cardiac Enzymes:  Recent Labs  05/21/16 1912 05/22/16 0154  TROPONINI <0.03 <0.03   BNP: Invalid input(s): POCBNP D-Dimer: No results for input(s): DDIMER in the last 72 hours. Hemoglobin A1C: No results for input(s): HGBA1C in the last 72 hours. Fasting Lipid Panel:  Recent Labs  05/22/16 0154  CHOL 157  HDL 55  LDLCALC 86  TRIG 81  CHOLHDL 2.9   Thyroid Function Tests: No results for input(s): TSH, T4TOTAL, T3FREE, THYROIDAB in the last 72 hours.  Invalid input(s): FREET3 Anemia Panel: No results for input(s): VITAMINB12, FOLATE, FERRITIN, TIBC, IRON, RETICCTPCT in the last 72 hours.  RADIOLOGY: Dg Chest  2 View  Result Date: 05/21/2016 CLINICAL DATA:  70 year old with acute onset of chest pain at 10:30 a.m. today. Current history of hypertension and CHF. EXAM: CHEST  2 VIEW COMPARISON:  05/04/2016, 03/27/2016 and earlier, including CTA chest 03/19/2016. FINDINGS: Cardiac silhouette mildly enlarged, unchanged dating back to May, 2016. Thoracic aorta atherosclerotic, unchanged. Hilar and mediastinal contours otherwise unremarkable. Pulmonary venous hypertension without overt edema currently. Lungs clear. No pleural effusions. No pneumothorax. Mild degenerative changes involving the thoracic spine. Osseous demineralization. IMPRESSION: 1. Stable mild cardiomegaly. Pulmonary venous hypertension without overt edema. No acute cardiopulmonary disease. 2. Thoracic aortic atherosclerosis. Electronically Signed   By: Evangeline Dakin M.D.   On: 05/21/2016 13:51   Dg Chest 2 View  Result Date: 05/04/2016 CLINICAL DATA:  70 year old female with history of coughing congestion since Friday. History of congestive heart failure and hypertension. EXAM: CHEST  2 VIEW COMPARISON:  Chest x-ray 03/27/2016. FINDINGS:  Lung volumes are normal. Diffuse peribronchial cuffing. No consolidative airspace disease. No pleural effusions. No pneumothorax. No pulmonary nodule or mass noted. Pulmonary vasculature and the cardiomediastinal silhouette are within normal limits. Atherosclerosis in the thoracic aorta. Surgical clips project over the right upper quadrant of the abdomen, likely from prior cholecystectomy. IMPRESSION: 1. Mild diffuse peribronchial cuffing, concerning for an acute bronchitis. 2. Atherosclerosis. Electronically Signed   By: Vinnie Langton M.D.   On: 05/04/2016 18:10    PHYSICAL EXAM General: NAD Neck: JVP 8 cm, no thyromegaly or thyroid nodule.  Lungs: Clear to auscultation bilaterally with normal respiratory effort. CV: Nondisplaced PMI.  Heart regular S1/S2, no S3/S4, no murmur.  Lymphedema noted in lower legs but no pitting edema.  \Abdomen: Soft, nontender, no hepatosplenomegaly, no distention.  Neurologic: Alert and oriented x 3.  Psych: Normal affect. Extremities: No clubbing or cyanosis.   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  1. Chest pain: She had a negative cath in 2009.  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  Yesterday, she had a severe/prolonged episode of chest pain with resolution with NTG.  Cardiac enzymes negative, ECG unchanged. Possible unstable angina.  Currently chest pain free.  - Size would make her a poor candidate for nuclear stress.  I think that she will need coronary angiography.  Creatinine up to 2.1 at admission, baseline 1.4-1.5.  I would like to see her creatinine back down to her baseline to minimize risk of contrast-mediated nephropathy.  Therefore, suspect cath will be on Monday.  - Add Imdur  30 mg daily.  Continue ASA 81 and statin.  2. Chronic diastolic CHF: She was told by nephrology to increase Lasix to 80 mg bid and to take metolazone x 3 days in a row recently by nephrology.  Creatinine was up to 2.11 from baseline 1.4-1.5.  On exam today, JVP around 8.  Volume status can be difficult to ascertain due to her baseline lymphedema.  - No more IV fluid.  - Hold diuretics today, plan to restart prior dose 80 qam/40 qpm tomorrow if creatinine coming down.  - RHC with LHC on Monday.  3. AKI on CKD stage III: As above, creatinine up from baseline with increased diuresis.  Volume status can be difficult to ascertain with lymphedema.  Diuretic held today, back to prior dose tomorrow.  Would like to see creatinine back down to baseline prior to coronary angiography to limit risk of contrast-medicated nephropathy.  Suspect cath will be Monday.   Loralie Champagne 05/22/2016 8:49 AM

## 2016-05-23 LAB — BASIC METABOLIC PANEL
Anion gap: 9 (ref 5–15)
BUN: 36 mg/dL — AB (ref 6–20)
CO2: 30 mmol/L (ref 22–32)
Calcium: 9.9 mg/dL (ref 8.9–10.3)
Chloride: 98 mmol/L — ABNORMAL LOW (ref 101–111)
Creatinine, Ser: 1.71 mg/dL — ABNORMAL HIGH (ref 0.44–1.00)
GFR calc non Af Amer: 29 mL/min — ABNORMAL LOW (ref >60)
GFR, EST AFRICAN AMERICAN: 34 mL/min — AB (ref >60)
Glucose, Bld: 87 mg/dL (ref 65–99)
Potassium: 4.1 mmol/L (ref 3.5–5.1)
SODIUM: 137 mmol/L (ref 135–145)

## 2016-05-23 NOTE — Progress Notes (Signed)
Patient ID: [name], female   DOB: 07/13/1946, 70 y.o.   MRN: [id number]   SUBJECTIVE: Chest pain resolved, troponin negative.  She feels back to baseline this morning.  Creatinine has improved, 2.1=> 1.85=> 1.7.   Scheduled Meds: . amLODipine  5 mg Oral Daily  . aspirin EC  81 mg Oral QHS  . baclofen  10 mg Oral TID  . donepezil  10 mg Oral Daily  . escitalopram  30 mg Oral Q supper  . fentaNYL  25 mcg Transdermal Q48H  . folic acid  1 mg Oral BID  . furosemide  40 mg Oral QPM  . furosemide  80 mg Oral QPC breakfast  . heparin  5,000 Units Subcutaneous Q8H  . isosorbide mononitrate  30 mg Oral Daily  . lamoTRIgine  100 mg Oral BID  . levothyroxine  75 mcg Oral QAC breakfast  . magnesium oxide  200 mg Oral BID  . memantine  10 mg Oral BID  . pantoprazole  40 mg Oral Daily  . pramipexole  0.5 mg Oral QHS  . pravastatin  80 mg Oral QHS   Continuous Infusions: . nitroGLYCERIN     PRN Meds:.acetaminophen, albuterol, benzonatate, nitroGLYCERIN, ondansetron (ZOFRAN) IV, oxyCODONE-acetaminophen    Vitals:   05/22/16 2358 05/23/16 0350 05/23/16 0731 05/23/16 0900  BP: 111/68 (!) 142/44 (!) 125/37 (!) 128/51  Pulse: (!) 56 (!) 57 (!) 57 (!) 56  Resp: 19 20 18    Temp: 98.9 F (37.2 C) 98.3 F (36.8 C) 97.3 F (36.3 C)   TempSrc: Oral Oral Oral   SpO2: 99% 98% 97%   Weight:  250 lb (113.4 kg)    Height:        Intake/Output Summary (Last 24 hours) at 05/23/16 0944 Last data filed at 05/23/16 [id number]  Gross per 24 hour  Intake              480 ml  Output             1050 ml  Net             -570 ml    LABS: Basic Metabolic Panel:  Recent Labs  05/22/16 0708 05/23/16 0335  NA 137 137  K 3.8 4.1  CL 98* 98*  CO2 30 30  GLUCOSE 93 87  BUN 41* 36*  CREATININE 1.85* 1.71*  CALCIUM 9.9 9.9   Liver Function Tests:  Recent Labs  05/21/16 1241  AST 40  ALT 31  ALKPHOS 92  BILITOT 0.8  PROT 7.8  ALBUMIN 4.3   No results for input(s): LIPASE, AMYLASE in  the last 72 hours. CBC:  Recent Labs  05/21/16 1241 05/22/16 0708  WBC 8.1 6.0  HGB 12.8 12.0  HCT 39.4 38.0  MCV 94.5 96.0  PLT 216 209   Cardiac Enzymes:  Recent Labs  05/21/16 1912 05/22/16 0154 05/22/16 0708  TROPONINI <0.03 <0.03 <0.03   BNP: Invalid input(s): POCBNP D-Dimer: No results for input(s): DDIMER in the last 72 hours. Hemoglobin A1C: No results for input(s): HGBA1C in the last 72 hours. Fasting Lipid Panel:  Recent Labs  05/22/16 0154  CHOL 157  HDL 55  LDLCALC 86  TRIG 81  CHOLHDL 2.9   Thyroid Function Tests: No results for input(s): TSH, T4TOTAL, T3FREE, THYROIDAB in the last 72 hours.  Invalid input(s): FREET3 Anemia Panel: No results for input(s): VITAMINB12, FOLATE, FERRITIN, TIBC, IRON, RETICCTPCT in the last 72 hours.  RADIOLOGY: Dg Chest 2 View  Result Date: 05/21/2016 CLINICAL DATA:  70 year old with acute onset of chest pain at 10:30 a.m. today. Current history of hypertension and CHF. EXAM: CHEST  2 VIEW COMPARISON:  05/04/2016, 03/27/2016 and earlier, including CTA chest 03/19/2016. FINDINGS: Cardiac silhouette mildly enlarged, unchanged dating back to May, 2016. Thoracic aorta atherosclerotic, unchanged. Hilar and mediastinal contours otherwise unremarkable. Pulmonary venous hypertension without overt edema currently. Lungs clear. No pleural effusions. No pneumothorax. Mild degenerative changes involving the thoracic spine. Osseous demineralization. IMPRESSION: 1. Stable mild cardiomegaly. Pulmonary venous hypertension without overt edema. No acute cardiopulmonary disease. 2. Thoracic aortic atherosclerosis. Electronically Signed   By: Evangeline Dakin M.D.   On: 05/21/2016 13:51   Dg Chest 2 View  Result Date: 05/04/2016 CLINICAL DATA:  70 year old female with history of coughing congestion since Friday. History of congestive heart failure and hypertension. EXAM: CHEST  2 VIEW COMPARISON:  Chest x-ray 03/27/2016. FINDINGS: Lung  volumes are normal. Diffuse peribronchial cuffing. No consolidative airspace disease. No pleural effusions. No pneumothorax. No pulmonary nodule or mass noted. Pulmonary vasculature and the cardiomediastinal silhouette are within normal limits. Atherosclerosis in the thoracic aorta. Surgical clips project over the right upper quadrant of the abdomen, likely from prior cholecystectomy. IMPRESSION: 1. Mild diffuse peribronchial cuffing, concerning for an acute bronchitis. 2. Atherosclerosis. Electronically Signed   By: Vinnie Langton M.D.   On: 05/04/2016 18:10    PHYSICAL EXAM General: NAD Neck: JVP 8 cm, no thyromegaly or thyroid nodule.  Lungs: Clear to auscultation bilaterally with normal respiratory effort. CV: Nondisplaced PMI.  Heart regular S1/S2, no S3/S4, no murmur.  Lymphedema noted in lower legs but no pitting edema.  \Abdomen: Soft, nontender, no hepatosplenomegaly, no distention.  Neurologic: Alert and oriented x 3.  Psych: Normal affect. Extremities: No clubbing or cyanosis.   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  1. Chest pain: She had a negative cath in 2009.  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  Prior to admission, she had a severe/prolonged episode of chest pain with resolution with NTG.  Cardiac enzymes negative, ECG unchanged. Possible unstable angina.  Currently chest pain free.  - Size would make her a poor candidate for nuclear stress.  I think that she will need coronary angiography.  Creatinine up to 2.1 at admission, baseline 1.4-1.5.  I would like to see her creatinine back down to her baseline to minimize risk of contrast-mediated nephropathy.  Creatinine down to 1.7 today, plan for cath on Monday.    - Added Imdur 30 mg daily.  Continue ASA 81 and statin.  2. Chronic diastolic CHF: She was told by nephrology to increase Lasix to 80 mg bid and to take metolazone x 3 days in a row recently by nephrology.  Creatinine was up to 2.11 from baseline 1.4-1.5.  On exam today, JVP around 8.  Volume status can be difficult to ascertain due to her baseline lymphedema.  - No more IV fluid.  - Can restart her prior home Lasix 80 qam/40 qpm today, watch creatinine closely.  - RHC with LHC on Monday.  3. AKI on CKD stage III: As above, creatinine up from baseline with increased diuresis.  Volume status can be difficult to ascertain with lymphedema.  Diuretics held initially.  Would like to see  creatinine back down to baseline prior to coronary angiography to limit risk of contrast-medicated nephropathy.  I will resume Lasix at her prior home dose before it had been increased by nephrology.  Follow creatinine carefully, would hold diuretics again Monday am prior to cath.   Loralie Champagne 05/23/2016 9:44 AM

## 2016-05-24 LAB — BASIC METABOLIC PANEL
Anion gap: 10 (ref 5–15)
BUN: 37 mg/dL — AB (ref 6–20)
CALCIUM: 10.1 mg/dL (ref 8.9–10.3)
CO2: 31 mmol/L (ref 22–32)
CREATININE: 2.06 mg/dL — AB (ref 0.44–1.00)
Chloride: 94 mmol/L — ABNORMAL LOW (ref 101–111)
GFR calc Af Amer: 27 mL/min — ABNORMAL LOW (ref >60)
GFR, EST NON AFRICAN AMERICAN: 23 mL/min — AB (ref >60)
GLUCOSE: 90 mg/dL (ref 65–99)
Potassium: 3.8 mmol/L (ref 3.5–5.1)
SODIUM: 135 mmol/L (ref 135–145)

## 2016-05-24 LAB — GLUCOSE, CAPILLARY
GLUCOSE-CAPILLARY: 98 mg/dL (ref 65–99)
GLUCOSE-CAPILLARY: 88 mg/dL (ref 65–99)
Glucose-Capillary: 97 mg/dL (ref 65–99)

## 2016-05-24 MED ORDER — SODIUM CHLORIDE 0.9 % IV SOLN
INTRAVENOUS | Status: AC
  Administered 2016-05-24: 15:00:00 via INTRAVENOUS

## 2016-05-24 MED ORDER — SODIUM CHLORIDE 0.9 % IV SOLN: INTRAVENOUS | Status: DC

## 2016-05-24 MED ORDER — SODIUM CHLORIDE 0.9 % IV SOLN
INTRAVENOUS | Status: DC
Start: 2016-05-24 — End: 2016-05-25

## 2016-05-24 MED ORDER — ASPIRIN 81 MG PO CHEW
81.0000 mg | CHEWABLE_TABLET | ORAL | Status: AC
  Administered 2016-05-25: 81 mg via ORAL
  Filled 2016-05-24: qty 1

## 2016-05-24 MED ORDER — SODIUM CHLORIDE 0.9% FLUSH: 3.0000 mL | Freq: Two times a day (BID) | INTRAVENOUS | Status: DC

## 2016-05-24 MED ORDER — SODIUM CHLORIDE 0.9 % IV SOLN: 250.0000 mL | INTRAVENOUS | Status: DC | PRN

## 2016-05-24 MED ORDER — SODIUM CHLORIDE 0.9% FLUSH: 3.0000 mL | INTRAVENOUS | Status: DC | PRN

## 2016-05-24 NOTE — Progress Notes (Addendum)
Patient ID: [name], female   DOB: August 29, 1945, 70 y.o.   MRN: [id number]   SUBJECTIVE: Chest pain resolved, troponin negative.  She feels back to baseline this morning.  Her prior home po Lasix was restarted yesterday, but creatinine rose again.  Creatinine trend 2.1=> 1.85=> 1.7 => 2.0.   Scheduled Meds: . amLODipine  5 mg Oral Daily  . aspirin EC  81 mg Oral QHS  . baclofen  10 mg Oral TID  . donepezil  10 mg Oral Daily  . escitalopram  30 mg Oral Q supper  . fentaNYL  25 mcg Transdermal Q48H  . folic acid  1 mg Oral BID  . heparin  5,000 Units Subcutaneous Q8H  . isosorbide mononitrate  30 mg Oral Daily  . lamoTRIgine  100 mg Oral BID  . levothyroxine  75 mcg Oral QAC breakfast  . magnesium oxide  200 mg Oral BID  . memantine  10 mg Oral BID  . pantoprazole  40 mg Oral Daily  . pramipexole  0.5 mg Oral QHS  . pravastatin  80 mg Oral QHS   Continuous Infusions: . sodium chloride    . nitroGLYCERIN     PRN Meds:.acetaminophen, albuterol, benzonatate, nitroGLYCERIN, ondansetron (ZOFRAN) IV, oxyCODONE-acetaminophen    Vitals:   05/23/16 2015 05/23/16 2344 05/24/16 0500 05/24/16 0809  BP: (!) 110/51 (!) 126/44 (!) 116/51 (!) 115/56  Pulse: (!) 58 83 88 (!) 54  Resp: 16 16 18 18   Temp: 97.8 F (36.6 C) 98.2 F (36.8 C) 98.4 F (36.9 C) 97.8 F (36.6 C)  TempSrc: Oral Oral Oral Oral  SpO2: 92% 94% 95% 92%  Weight:   248 lb 12.8 oz (112.9 kg)   Height:        Intake/Output Summary (Last 24 hours) at 05/24/16 0946 Last data filed at 05/24/16 0835  Gross per 24 hour  Intake              720 ml  Output             2975 ml  Net            -2255 ml    LABS: Basic Metabolic Panel:  Recent Labs  05/23/16 0335 05/24/16 0412  NA 137 135  K 4.1 3.8  CL 98* 94*  CO2 30 31  GLUCOSE 87 90  BUN 36* 37*  CREATININE 1.71* 2.06*  CALCIUM 9.9 10.1   Liver Function Tests:  Recent Labs  05/21/16 1241  AST 40  ALT 31  ALKPHOS 92  BILITOT 0.8  PROT 7.8    ALBUMIN 4.3   No results for input(s): LIPASE, AMYLASE in the last 72 hours. CBC:  Recent Labs  05/21/16 1241 05/22/16 0708  WBC 8.1 6.0  HGB 12.8 12.0  HCT 39.4 38.0  MCV 94.5 96.0  PLT 216 209   Cardiac Enzymes:  Recent Labs  05/21/16 1912 05/22/16 0154 05/22/16 0708  TROPONINI <0.03 <0.03 <0.03   BNP: Invalid input(s): POCBNP D-Dimer: No results for input(s): DDIMER in the last 72 hours. Hemoglobin A1C: No results for input(s): HGBA1C in the last 72 hours. Fasting Lipid Panel:  Recent Labs  05/22/16 0154  CHOL 157  HDL 55  LDLCALC 86  TRIG 81  CHOLHDL 2.9   Thyroid Function Tests: No results for input(s): TSH, T4TOTAL, T3FREE, THYROIDAB in the last 72 hours.  Invalid input(s): FREET3 Anemia Panel: No results for input(s): VITAMINB12, FOLATE, FERRITIN, TIBC, IRON, RETICCTPCT in the last 72  hours.  RADIOLOGY: Dg Chest 2 View  Result Date: 05/21/2016 CLINICAL DATA:  70 year old with acute onset of chest pain at 10:30 a.m. today. Current history of hypertension and CHF. EXAM: CHEST  2 VIEW COMPARISON:  05/04/2016, 03/27/2016 and earlier, including CTA chest 03/19/2016. FINDINGS: Cardiac silhouette mildly enlarged, unchanged dating back to May, 2016. Thoracic aorta atherosclerotic, unchanged. Hilar and mediastinal contours otherwise unremarkable. Pulmonary venous hypertension without overt edema currently. Lungs clear. No pleural effusions. No pneumothorax. Mild degenerative changes involving the thoracic spine. Osseous demineralization. IMPRESSION: 1. Stable mild cardiomegaly. Pulmonary venous hypertension without overt edema. No acute cardiopulmonary disease. 2. Thoracic aortic atherosclerosis. Electronically Signed   By: Evangeline Dakin M.D.   On: 05/21/2016 13:51   Dg Chest 2 View  Result Date: 05/04/2016 CLINICAL DATA:  70 year old female with history of coughing congestion since Friday. History of congestive heart failure and hypertension. EXAM: CHEST  2  VIEW COMPARISON:  Chest x-ray 03/27/2016. FINDINGS: Lung volumes are normal. Diffuse peribronchial cuffing. No consolidative airspace disease. No pleural effusions. No pneumothorax. No pulmonary nodule or mass noted. Pulmonary vasculature and the cardiomediastinal silhouette are within normal limits. Atherosclerosis in the thoracic aorta. Surgical clips project over the right upper quadrant of the abdomen, likely from prior cholecystectomy. IMPRESSION: 1. Mild diffuse peribronchial cuffing, concerning for an acute bronchitis. 2. Atherosclerosis. Electronically Signed   By: Vinnie Langton M.D.   On: 05/04/2016 18:10    PHYSICAL EXAM General: NAD Neck: JVP 7 cm, no thyromegaly or thyroid nodule.  Lungs: Clear to auscultation bilaterally with normal respiratory effort. CV: Nondisplaced PMI.  Heart regular S1/S2, no S3/S4, no murmur.  Lymphedema noted in lower legs but no pitting edema.  \Abdomen: Soft, nontender, no hepatosplenomegaly, no distention.  Neurologic: Alert and oriented x 3.  Psych: Normal affect. Extremities: No clubbing or cyanosis.   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  1. Chest pain: She had a negative cath in 2009.  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  Prior to admission, she had a severe/prolonged episode of chest pain with resolution with NTG.  Cardiac enzymes negative, ECG unchanged. Possible unstable angina.  Currently chest pain free.  - Size would make her a poor candidate for nuclear stress.  I think that she will need coronary angiography.  Creatinine up to 2.1 at admission, baseline 1.4-1.5.  I would like to see her creatinine back down nearer to her baseline to minimize risk of contrast-mediated  nephropathy.  She does not appear volume overloaded, hold Lasix again and will hydrate today.  Risks/benefits of procedure discussed and will plan for tomorrow if creatinine stabilizes.  - Added Imdur 30 mg daily.  Continue ASA 81 and statin.  2. Chronic diastolic CHF: She was told by nephrology to increase Lasix to 80 mg bid and to take metolazone x 3 days in a row recently by nephrology.  Creatinine was up to 2.11 from baseline 1.4-1.5.  On exam today, she does not appear volume overloaded.  Volume status can be difficult to ascertain due to her baseline lymphedema.  - Hold po Lasix.    - Gentle IV fluid prior to cath tomorrow to get creatinine back down. - RHC with LHC on Monday.  3. AKI on CKD stage III: As above,  creatinine up from baseline with increased diuresis.  Volume status can be difficult to ascertain with lymphedema.  Diuretics held initially.  Would like to see creatinine back down nearer to baseline prior to coronary angiography to limit risk of contrast-medicated nephropathy. Hold Lasix today.  Will give gentle IV fluid as she does not appear volume overloaded.  If creatinine comes back down by tomorrow morning, will proceed with cath.   Loralie Champagne 05/24/2016 9:46 AM

## 2016-05-25 ENCOUNTER — Encounter (HOSPITAL_COMMUNITY)
Admission: EM | Disposition: A | Discharge status: Home Health | Payer: Self-pay | Source: Home / Self Care | Attending: Cardiovascular Disease

## 2016-05-25 ENCOUNTER — Encounter (HOSPITAL_COMMUNITY): Payer: Self-pay | Admitting: Cardiology

## 2016-05-25 DIAGNOSIS — I200 Unstable angina: Secondary | ICD-10-CM

## 2016-05-25 DIAGNOSIS — I509 Heart failure, unspecified: Secondary | ICD-10-CM

## 2016-05-25 HISTORY — PX: CARDIAC CATHETERIZATION: SHX172

## 2016-05-25 LAB — CBC
HEMATOCRIT: 37.7 % (ref 36.0–46.0)
HEMOGLOBIN: 11.9 g/dL — AB (ref 12.0–15.0)
MCH: 30.1 pg (ref 26.0–34.0)
MCHC: 31.6 g/dL (ref 30.0–36.0)
MCV: 95.4 fL (ref 78.0–100.0)
PLATELETS: 195 10*3/uL (ref 150–400)
RBC: 3.95 MIL/uL (ref 3.87–5.11)
RDW: 15.3 % (ref 11.5–15.5)
WBC: 6.8 10*3/uL (ref 4.0–10.5)

## 2016-05-25 LAB — POCT I-STAT 3, VENOUS BLOOD GAS (G3P V)
ACID-BASE EXCESS: 4 mmol/L — AB (ref 0.0–2.0)
ACID-BASE EXCESS: 4 mmol/L — AB (ref 0.0–2.0)
Bicarbonate: 31.1 mmol/L — ABNORMAL HIGH (ref 20.0–28.0)
Bicarbonate: 31.4 mmol/L — ABNORMAL HIGH (ref 20.0–28.0)
O2 SAT: 63 %
O2 SAT: 65 %
PCO2 VEN: 56.9 mmHg (ref 44.0–60.0)
PH VEN: 7.347 (ref 7.250–7.430)
PO2 VEN: 36 mmHg (ref 32.0–45.0)
TCO2: 33 mmol/L (ref 0–100)
TCO2: 33 mmol/L (ref 0–100)
pCO2, Ven: 56.7 mmHg (ref 44.0–60.0)
pH, Ven: 7.35 (ref 7.250–7.430)
pO2, Ven: 36 mmHg (ref 32.0–45.0)

## 2016-05-25 LAB — PROTIME-INR
INR: 1.07
Prothrombin Time: 14 seconds (ref 11.4–15.2)

## 2016-05-25 LAB — BASIC METABOLIC PANEL
Anion gap: 10 (ref 5–15)
BUN: 37 mg/dL — AB (ref 6–20)
CALCIUM: 9.5 mg/dL (ref 8.9–10.3)
CO2: 27 mmol/L (ref 22–32)
Chloride: 97 mmol/L — ABNORMAL LOW (ref 101–111)
Creatinine, Ser: 1.74 mg/dL — ABNORMAL HIGH (ref 0.44–1.00)
GFR calc Af Amer: 33 mL/min — ABNORMAL LOW (ref >60)
GFR, EST NON AFRICAN AMERICAN: 28 mL/min — AB (ref >60)
GLUCOSE: 95 mg/dL (ref 65–99)
Potassium: 3.5 mmol/L (ref 3.5–5.1)
Sodium: 134 mmol/L — ABNORMAL LOW (ref 135–145)

## 2016-05-25 LAB — POCT ACTIVATED CLOTTING TIME: ACTIVATED CLOTTING TIME: 158 s

## 2016-05-25 SURGERY — RIGHT/LEFT HEART CATH AND CORONARY ANGIOGRAPHY
Anesthesia: LOCAL

## 2016-05-25 MED ORDER — HEPARIN (PORCINE) IN NACL 2-0.9 UNIT/ML-% IJ SOLN
INTRAMUSCULAR | Status: AC
  Filled 2016-05-25: qty 1000

## 2016-05-25 MED ORDER — HEPARIN SODIUM (PORCINE) 1000 UNIT/ML IJ SOLN
INTRAMUSCULAR | Status: DC | PRN
  Administered 2016-05-25: 5000 [IU] via INTRAVENOUS

## 2016-05-25 MED ORDER — FENTANYL CITRATE (PF) 100 MCG/2ML IJ SOLN
INTRAMUSCULAR | Status: DC | PRN
  Administered 2016-05-25 (×2): 50 ug via INTRAVENOUS
  Administered 2016-05-25: 25 ug via INTRAVENOUS

## 2016-05-25 MED ORDER — FENTANYL CITRATE (PF) 100 MCG/2ML IJ SOLN
INTRAMUSCULAR | Status: AC
  Filled 2016-05-25: qty 2

## 2016-05-25 MED ORDER — FUROSEMIDE 80 MG PO TABS: 40.0000 mg | ORAL_TABLET | Freq: Two times a day (BID) | ORAL | 6 refills | Status: DC

## 2016-05-25 MED ORDER — SODIUM CHLORIDE 0.9 % WEIGHT BASED INFUSION: 1.0000 mL/kg/h | INTRAVENOUS | Status: AC

## 2016-05-25 MED ORDER — NITROGLYCERIN 1 MG/10 ML FOR IR/CATH LAB
INTRA_ARTERIAL | Status: AC
Start: 2016-05-25 — End: 2016-05-25
  Filled 2016-05-25: qty 10

## 2016-05-25 MED ORDER — HEPARIN SODIUM (PORCINE) 1000 UNIT/ML IJ SOLN
INTRAMUSCULAR | Status: AC
  Filled 2016-05-25: qty 1

## 2016-05-25 MED ORDER — IOPAMIDOL (ISOVUE-370) INJECTION 76%
INTRAVENOUS | Status: DC | PRN
  Administered 2016-05-25: 50 mL via INTRA_ARTERIAL

## 2016-05-25 MED ORDER — LIDOCAINE HCL (PF) 1 % IJ SOLN
INTRAMUSCULAR | Status: AC
  Filled 2016-05-25: qty 30

## 2016-05-25 MED ORDER — HEPARIN (PORCINE) IN NACL 2-0.9 UNIT/ML-% IJ SOLN
INTRAMUSCULAR | Status: AC
  Filled 2016-05-25: qty 500

## 2016-05-25 MED ORDER — SODIUM CHLORIDE 0.9% FLUSH: 3.0000 mL | Freq: Two times a day (BID) | INTRAVENOUS | Status: DC

## 2016-05-25 MED ORDER — POTASSIUM CHLORIDE ER 20 MEQ PO TBCR: 20.0000 meq | EXTENDED_RELEASE_TABLET | Freq: Every day | ORAL | 6 refills | Status: DC

## 2016-05-25 MED ORDER — HEPARIN SODIUM (PORCINE) 5000 UNIT/ML IJ SOLN: 5000.0000 [IU] | Freq: Three times a day (TID) | INTRAMUSCULAR | Status: DC

## 2016-05-25 MED ORDER — SODIUM CHLORIDE 0.9% FLUSH: 3.0000 mL | INTRAVENOUS | Status: DC | PRN

## 2016-05-25 MED ORDER — FENTANYL CITRATE (PF) 100 MCG/2ML IJ SOLN
25.0000 ug | INTRAMUSCULAR | Status: DC | PRN
  Administered 2016-05-25: 25 ug via INTRAVENOUS

## 2016-05-25 MED ORDER — FENTANYL CITRATE (PF) 100 MCG/2ML IJ SOLN
INTRAMUSCULAR | Status: AC
Start: 2016-05-25 — End: 2016-05-25
  Filled 2016-05-25: qty 2

## 2016-05-25 MED ORDER — VERAPAMIL HCL 2.5 MG/ML IV SOLN
INTRAVENOUS | Status: AC
  Filled 2016-05-25: qty 2

## 2016-05-25 MED ORDER — IOPAMIDOL (ISOVUE-370) INJECTION 76%
INTRAVENOUS | Status: AC
  Filled 2016-05-25: qty 100

## 2016-05-25 MED ORDER — HEPARIN (PORCINE) IN NACL 2-0.9 UNIT/ML-% IJ SOLN
INTRAMUSCULAR | Status: DC | PRN
  Administered 2016-05-25: 1500 mL

## 2016-05-25 MED ORDER — SODIUM CHLORIDE 0.9 % IV SOLN: 250.0000 mL | INTRAVENOUS | Status: DC | PRN

## 2016-05-25 MED ORDER — MIDAZOLAM HCL 2 MG/2ML IJ SOLN
INTRAMUSCULAR | Status: DC | PRN
  Administered 2016-05-25 (×4): 1 mg via INTRAVENOUS

## 2016-05-25 MED ORDER — MIDAZOLAM HCL 2 MG/2ML IJ SOLN
INTRAMUSCULAR | Status: AC
  Filled 2016-05-25: qty 2

## 2016-05-25 MED ORDER — VERAPAMIL HCL 2.5 MG/ML IV SOLN
INTRAVENOUS | Status: DC | PRN
  Administered 2016-05-25: 10 mL via INTRA_ARTERIAL

## 2016-05-25 MED ORDER — ACETAMINOPHEN 325 MG PO TABS: 650.0000 mg | ORAL_TABLET | ORAL | Status: DC | PRN

## 2016-05-25 MED ORDER — ONDANSETRON HCL 4 MG/2ML IJ SOLN: 4.0000 mg | Freq: Four times a day (QID) | INTRAMUSCULAR | Status: DC | PRN

## 2016-05-25 MED ORDER — LIDOCAINE HCL (PF) 1 % IJ SOLN
INTRAMUSCULAR | Status: DC | PRN
  Administered 2016-05-25: 2 mL
  Administered 2016-05-25: 15 mL

## 2016-05-25 SURGICAL SUPPLY — 14 items
CATH INFINITI 5 FR JL3.5 (CATHETERS) ×1 IMPLANT
CATH INFINITI 5FR ANG PIGTAIL (CATHETERS) ×1 IMPLANT
CATH INFINITI JR4 5F (CATHETERS) ×1 IMPLANT
CATH LAUNCHER 5F JL3 (CATHETERS) IMPLANT
CATH SWAN GANZ 7F STRAIGHT (CATHETERS) ×1 IMPLANT
CATHETER LAUNCHER 5F JL3 (CATHETERS) ×2
DEVICE RAD COMP TR BAND LRG (VASCULAR PRODUCTS) ×1 IMPLANT
GLIDESHEATH SLEND SS 6F .021 (SHEATH) ×1 IMPLANT
KIT HEART LEFT (KITS) ×2 IMPLANT
PACK CARDIAC CATHETERIZATION (CUSTOM PROCEDURE TRAY) ×2 IMPLANT
SHEATH PINNACLE 7F 10CM (SHEATH) ×1 IMPLANT
TRANSDUCER W/STOPCOCK (MISCELLANEOUS) ×3 IMPLANT
TUBING CIL FLEX 10 FLL-RA (TUBING) ×2 IMPLANT
WIRE SAFE-T 1.5MM-J .035X260CM (WIRE) ×1 IMPLANT

## 2016-05-25 NOTE — Care Management Note (Signed)
Case Management Note  Patient Details  Name: [name] MRN: [id number] Date of Birth: 23-Feb-1946  Subjective/Objective:    Pt presented for AKI, Chest Pain and CHF. Pt is from home and the plan is to return home with [place].              Action/Plan: Pt was previously active with [place] ([facility]). Pt wants to continue the services and SOC ot begin within 24-48 hours post d/c. No further needs from CM at this time.   Expected Discharge Date:                  Expected Discharge Plan:  [place]  In-House Referral:  NA  Discharge planning Services  CM Consult  Post Acute Care Choice:  Home Health, Resumption of Svcs/PTA Provider Choice offered to:  Patient  DME Arranged:  N/A DME Agency:  NA  HH Arranged:  RN, PT [place] Agency:  [facility] (now [facility])  Status of Service:  Completed, signed off  If discussed at [place] of Stay Meetings, dates discussed:    Additional Comments:  Bethena Roys, RN 05/25/2016, 2:39 PM

## 2016-05-25 NOTE — Progress Notes (Signed)
Pt discharged home. Discharge instructions have been gone over with the patient. IV's removed. Pt given unit number and told to call if they have any concerns regarding their discharge instructions. Louanne Calvillo V, RN   

## 2016-05-25 NOTE — H&P (View-Only) (Signed)
Patient ID: [name], female   DOB: Sep 22, 1945, 70 y.o.   MRN: [id number]   SUBJECTIVE: Chest pain resolved, troponin negative.  She feels back to baseline this morning.  Her prior home po Lasix was restarted yesterday, but creatinine rose again.  Creatinine trend 2.1=> 1.85=> 1.7 => 2.0.   Scheduled Meds: . amLODipine  5 mg Oral Daily  . aspirin EC  81 mg Oral QHS  . baclofen  10 mg Oral TID  . donepezil  10 mg Oral Daily  . escitalopram  30 mg Oral Q supper  . fentaNYL  25 mcg Transdermal Q48H  . folic acid  1 mg Oral BID  . heparin  5,000 Units Subcutaneous Q8H  . isosorbide mononitrate  30 mg Oral Daily  . lamoTRIgine  100 mg Oral BID  . levothyroxine  75 mcg Oral QAC breakfast  . magnesium oxide  200 mg Oral BID  . memantine  10 mg Oral BID  . pantoprazole  40 mg Oral Daily  . pramipexole  0.5 mg Oral QHS  . pravastatin  80 mg Oral QHS   Continuous Infusions: . sodium chloride    . nitroGLYCERIN     PRN Meds:.acetaminophen, albuterol, benzonatate, nitroGLYCERIN, ondansetron (ZOFRAN) IV, oxyCODONE-acetaminophen    Vitals:   05/23/16 2015 05/23/16 2344 05/24/16 0500 05/24/16 0809  BP: (!) 110/51 (!) 126/44 (!) 116/51 (!) 115/56  Pulse: (!) 58 83 88 (!) 54  Resp: 16 16 18 18   Temp: 97.8 F (36.6 C) 98.2 F (36.8 C) 98.4 F (36.9 C) 97.8 F (36.6 C)  TempSrc: Oral Oral Oral Oral  SpO2: 92% 94% 95% 92%  Weight:   248 lb 12.8 oz (112.9 kg)   Height:        Intake/Output Summary (Last 24 hours) at 05/24/16 0946 Last data filed at 05/24/16 0835  Gross per 24 hour  Intake              720 ml  Output             2975 ml  Net            -2255 ml    LABS: Basic Metabolic Panel:  Recent Labs  05/23/16 0335 05/24/16 0412  NA 137 135  K 4.1 3.8  CL 98* 94*  CO2 30 31  GLUCOSE 87 90  BUN 36* 37*  CREATININE 1.71* 2.06*  CALCIUM 9.9 10.1   Liver Function Tests:  Recent Labs  05/21/16 1241  AST 40  ALT 31  ALKPHOS 92  BILITOT 0.8  PROT 7.8    ALBUMIN 4.3   No results for input(s): LIPASE, AMYLASE in the last 72 hours. CBC:  Recent Labs  05/21/16 1241 05/22/16 0708  WBC 8.1 6.0  HGB 12.8 12.0  HCT 39.4 38.0  MCV 94.5 96.0  PLT 216 209   Cardiac Enzymes:  Recent Labs  05/21/16 1912 05/22/16 0154 05/22/16 0708  TROPONINI <0.03 <0.03 <0.03   BNP: Invalid input(s): POCBNP D-Dimer: No results for input(s): DDIMER in the last 72 hours. Hemoglobin A1C: No results for input(s): HGBA1C in the last 72 hours. Fasting Lipid Panel:  Recent Labs  05/22/16 0154  CHOL 157  HDL 55  LDLCALC 86  TRIG 81  CHOLHDL 2.9   Thyroid Function Tests: No results for input(s): TSH, T4TOTAL, T3FREE, THYROIDAB in the last 72 hours.  Invalid input(s): FREET3 Anemia Panel: No results for input(s): VITAMINB12, FOLATE, FERRITIN, TIBC, IRON, RETICCTPCT in the last 72  hours.  RADIOLOGY: Dg Chest 2 View  Result Date: 05/21/2016 CLINICAL DATA:  70 year old with acute onset of chest pain at 10:30 a.m. today. Current history of hypertension and CHF. EXAM: CHEST  2 VIEW COMPARISON:  05/04/2016, 03/27/2016 and earlier, including CTA chest 03/19/2016. FINDINGS: Cardiac silhouette mildly enlarged, unchanged dating back to May, 2016. Thoracic aorta atherosclerotic, unchanged. Hilar and mediastinal contours otherwise unremarkable. Pulmonary venous hypertension without overt edema currently. Lungs clear. No pleural effusions. No pneumothorax. Mild degenerative changes involving the thoracic spine. Osseous demineralization. IMPRESSION: 1. Stable mild cardiomegaly. Pulmonary venous hypertension without overt edema. No acute cardiopulmonary disease. 2. Thoracic aortic atherosclerosis. Electronically Signed   By: Evangeline Dakin M.D.   On: 05/21/2016 13:51   Dg Chest 2 View  Result Date: 05/04/2016 CLINICAL DATA:  70 year old female with history of coughing congestion since Friday. History of congestive heart failure and hypertension. EXAM: CHEST  2  VIEW COMPARISON:  Chest x-ray 03/27/2016. FINDINGS: Lung volumes are normal. Diffuse peribronchial cuffing. No consolidative airspace disease. No pleural effusions. No pneumothorax. No pulmonary nodule or mass noted. Pulmonary vasculature and the cardiomediastinal silhouette are within normal limits. Atherosclerosis in the thoracic aorta. Surgical clips project over the right upper quadrant of the abdomen, likely from prior cholecystectomy. IMPRESSION: 1. Mild diffuse peribronchial cuffing, concerning for an acute bronchitis. 2. Atherosclerosis. Electronically Signed   By: Vinnie Langton M.D.   On: 05/04/2016 18:10    PHYSICAL EXAM General: NAD Neck: JVP 7 cm, no thyromegaly or thyroid nodule.  Lungs: Clear to auscultation bilaterally with normal respiratory effort. CV: Nondisplaced PMI.  Heart regular S1/S2, no S3/S4, no murmur.  Lymphedema noted in lower legs but no pitting edema.  \Abdomen: Soft, nontender, no hepatosplenomegaly, no distention.  Neurologic: Alert and oriented x 3.  Psych: Normal affect. Extremities: No clubbing or cyanosis.   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  1. Chest pain: She had a negative cath in 2009.  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  Prior to admission, she had a severe/prolonged episode of chest pain with resolution with NTG.  Cardiac enzymes negative, ECG unchanged. Possible unstable angina.  Currently chest pain free.  - Size would make her a poor candidate for nuclear stress.  I think that she will need coronary angiography.  Creatinine up to 2.1 at admission, baseline 1.4-1.5.  I would like to see her creatinine back down nearer to her baseline to minimize risk of contrast-mediated  nephropathy.  She does not appear volume overloaded, hold Lasix again and will hydrate today.  Risks/benefits of procedure discussed and will plan for tomorrow if creatinine stabilizes.  - Added Imdur 30 mg daily.  Continue ASA 81 and statin.  2. Chronic diastolic CHF: She was told by nephrology to increase Lasix to 80 mg bid and to take metolazone x 3 days in a row recently by nephrology.  Creatinine was up to 2.11 from baseline 1.4-1.5.  On exam today, she does not appear volume overloaded.  Volume status can be difficult to ascertain due to her baseline lymphedema.  - Hold po Lasix.    - Gentle IV fluid prior to cath tomorrow to get creatinine back down. - RHC with LHC on Monday.  3. AKI on CKD stage III: As above,  creatinine up from baseline with increased diuresis.  Volume status can be difficult to ascertain with lymphedema.  Diuretics held initially.  Would like to see creatinine back down nearer to baseline prior to coronary angiography to limit risk of contrast-medicated nephropathy. Hold Lasix today.  Will give gentle IV fluid as she does not appear volume overloaded.  If creatinine comes back down by tomorrow morning, will proceed with cath.   Loralie Champagne 05/24/2016 9:46 AM

## 2016-05-25 NOTE — Interval H&P Note (Signed)
Cath Lab Visit (complete for each Cath Lab visit)  Clinical Evaluation Leading to the Procedure:   ACS: Yes.    Non-ACS:    Anginal Classification: CCS III  Anti-ischemic medical therapy: Minimal Therapy (1 class of medications)  Non-Invasive Test Results: No non-invasive testing performed  Prior CABG: No previous CABG      History and Physical Interval Note:  05/25/2016 7:48 AM  [name]  has presented today for surgery, with the diagnosis of sob, cp  The various methods of treatment have been discussed with the patient and family. After consideration of risks, benefits and other options for treatment, the patient has consented to  Procedure(s): Right/Left Heart Cath and Coronary Angiography (N/A) as a surgical intervention .  The patient's history has been reviewed, patient examined, no change in status, stable for surgery.  I have reviewed the patient's chart and labs.  Questions were answered to the patient's satisfaction.     [name] [employer]

## 2016-05-25 NOTE — Discharge Instructions (Signed)
Radial Site Care °Refer to this sheet in the next few weeks. These instructions provide you with information about caring for yourself after your procedure. Your health care provider may also give you more specific instructions. Your treatment has been planned according to current medical practices, but problems sometimes occur. Call your health care provider if you have any problems or questions after your procedure. °WHAT TO EXPECT AFTER THE PROCEDURE °After your procedure, it is typical to have the following: °· Bruising at the radial site that usually fades within 1-2 weeks. °· Blood collecting in the tissue (hematoma) that may be painful to the touch. It should usually decrease in size and tenderness within 1-2 weeks. °HOME CARE INSTRUCTIONS °· Take medicines only as directed by your health care provider. °· You may shower 24-48 hours after the procedure or as directed by your health care provider. Remove the bandage (dressing) and gently wash the site with plain soap and water. Pat the area dry with a clean towel. Do not rub the site, because this may cause bleeding. °· Do not take baths, swim, or use a hot tub until your health care provider approves. °· Check your insertion site every day for redness, swelling, or drainage. °· Do not apply powder or lotion to the site. °· Do not flex or bend the affected arm for 24 hours or as directed by your health care provider. °· Do not push or pull heavy objects with the affected arm for 24 hours or as directed by your health care provider. °· Do not lift over 10 lb (4.5 kg) for 5 days after your procedure or as directed by your health care provider. °· Ask your health care provider when it is okay to: °¨ Return to work or school. °¨ Resume usual physical activities or sports. °¨ Resume sexual activity. °· Do not drive home if you are discharged the same day as the procedure. Have someone else drive you. °· You may drive 24 hours after the procedure unless otherwise  instructed by your health care provider. °· Do not operate machinery or power tools for 24 hours after the procedure. °· If your procedure was done as an outpatient procedure, which means that you went home the same day as your procedure, a responsible adult should be with you for the first 24 hours after you arrive home. °· Keep all follow-up visits as directed by your health care provider. This is important. °SEEK MEDICAL CARE IF: °· You have a fever. °· You have chills. °· You have increased bleeding from the radial site. Hold pressure on the site. °SEEK IMMEDIATE MEDICAL CARE IF: °· You have unusual pain at the radial site. °· You have redness, warmth, or swelling at the radial site. °· You have drainage (other than a small amount of blood on the dressing) from the radial site. °· The radial site is bleeding, and the bleeding does not stop after 30 minutes of holding steady pressure on the site. °· Your arm or hand becomes pale, cool, tingly, or numb. °  °This information is not intended to replace advice given to you by your health care provider. Make sure you discuss any questions you have with your health care provider. °  °Document Released: 09/12/2010 Document Revised: 08/31/2014 Document Reviewed: 02/26/2014 °Elsevier Interactive Patient Education ©2016 [employer] ° °

## 2016-05-25 NOTE — Progress Notes (Signed)
7 Fr venous sheath was removed from the right groin. Pre site level 0, manual pressure held for 10 minutes, ACT on removal 150. Post site level 0, no complaints of groin discomfort at this time. Pedal pulse +2, bed rest beginning at 0920. Vital signs remain stable. Clean dry dressing applied. Post care instructions given.

## 2016-05-25 NOTE — Discharge Summary (Signed)
Advanced Heart Failure Team  Discharge Summary   Patient ID: [name] MRN: [id number], DOB/AGE: 1945/11/26 70 y.o. Admit date: 05/21/2016 D/C date:     05/25/2016   Primary Discharge Diagnoses:  1. Chest Pain 2. Chronic Diastolic Heart Failure 3. AKI on CKD Stage III 4. 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  RHC and LHC performed. Cors ok with low filling pressures noted. Diuretics cut back. Plan to follow closely in the HF clinic. Check BMET at outpatient appointment. She will resume HH.   1. Chest pain: She had a negative cath in 2009.  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  Prior to admission, she had a severe/prolonged episode of chest pain with resolution with NTG.  Cardiac enzymes negative, ECG unchanged. Concern for unstable angina.  Coronary angiography was done, no obstructive CAD.  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 2. Chronic diastolic CHF: She was told by nephrology to increase Lasix to 80 mg bid and to take metolazone x 3 days in a row recently by nephrology.  Creatinine was up to 2.11 from baseline 1.4-1.5.  On exam today, she does not appear volume overloaded.  Volume status can be difficult to ascertain due to her baseline lymphedema. RHC today suggested euvolemia.  - Gently hydration post-cath.  - Resume Lasix at lower dose tomorrow, 40 mg bid with KCl 20 daily.    3. AKI on CKD stage III: As above, creatinine up from baseline initially with increased diuresis.  Volume status can be difficult to ascertain with lymphedema.  RHC did not suggest volume overload.  She was gently hydrated yesterday, we used about 45 cc contrast with cath.  Will give gentle hydration post-cath.  - Cut back on diuretics at home.      Procedures RHC [facility] 05/25/2016  30% Proximal LCX others no CAD  RA mean 4 RV 21/6 PA 25/11, mean 16 PCWP mean 7 LV 102/17 AO 100/42 Oxygen saturations: PA 64% AO 98% Cardiac Output (Fick) 5.32  Cardiac Index (Fick) 2.42 Discharge Weight: 249 pounds.  Discharge Vitals: Blood pressure (!) 99/39, pulse (!) 53, temperature 97.5 F (36.4 C), temperature source Oral, resp. rate 18, height 5\' 6"  (1.676 m), weight 249 lb 9.6 oz (113.2 kg), SpO2 94 %.  Labs: Lab Results  Component Value Date   WBC 6.8 05/25/2016   HGB 11.9 (L) 05/25/2016   HCT 37.7 05/25/2016   MCV 95.4 05/25/2016   PLT 195 05/25/2016    Recent Labs Lab 05/21/16 1241  05/25/16 0446  NA 136  < > 134*  K 4.6  < > 3.5  CL 95*  < > 97*  CO2 30  < > 27  BUN 57*  < > 37*  CREATININE 2.11*  < > 1.74*  CALCIUM 10.2  < > 9.5  PROT 7.8  --   --   BILITOT 0.8  --   --   ALKPHOS 92  --   --   ALT 31  --   --   AST 40  --   --   GLUCOSE 109*  < > 95  < > = values in this interval not displayed. Lab Results  Component Value Date   CHOL 157 05/22/2016   HDL 55 05/22/2016   LDLCALC 86 05/22/2016  TRIG 81 05/22/2016   BNP (last 3 results)  Recent Labs  05/04/16 1731 05/15/16 1012 05/21/16 1250  BNP 62.5 45.0 25.1    ProBNP (last 3 results) No results for input(s): PROBNP in the last 8760 hours.   Diagnostic Studies/Procedures   No results found.  Discharge Medications     Medication List    STOP taking these medications   metolazone 2.5 MG tablet Commonly known as:  ZAROXOLYN     TAKE these medications   albuterol 108 (90 Base) MCG/ACT inhaler Commonly known as:  PROVENTIL HFA;VENTOLIN HFA Inhale 1-2 puffs into the lungs every 6 (six) hours as needed for wheezing or shortness of breath.   alendronate 70 MG tablet Commonly known as:  FOSAMAX Take 70 mg by mouth every Monday. Take with a full glass of water on an empty stomach.   amLODipine 5 MG tablet Commonly known as:   NORVASC Take 5 mg by mouth Daily.   aspirin 81 MG tablet Take 81 mg by mouth at bedtime.   baclofen 10 MG tablet Commonly known as:  LIORESAL Take 10 mg by mouth 3 (three) times daily.   DOK PLUS 8.6-50 MG tablet Generic drug:  senna-docusate Take 2 tablets by mouth at bedtime.   donepezil 10 MG tablet Commonly known as:  ARICEPT Take 10 mg by mouth daily.   escitalopram 20 MG tablet Commonly known as:  LEXAPRO Take 30 mg by mouth daily with supper.   fentaNYL 25 MCG/HR patch Commonly known as:  Ririe - dosed mcg/hr Place 1 patch onto the skin See admin instructions. EVERY 48 HOURS   folic acid 1 MG tablet Commonly known as:  FOLVITE Take 1 mg by mouth 2 (two) times daily.   furosemide 80 MG tablet Commonly known as:  LASIX Take 0.5 tablets (40 mg total) by mouth 2 (two) times daily. Start taking on:  05/26/2016 What changed:  how much to take   isosorbide mononitrate 30 MG 24 hr tablet Commonly known as:  IMDUR Take 30 mg by mouth daily.   lamoTRIgine 100 MG tablet Commonly known as:  LAMICTAL Take 100 mg by mouth 2 (two) times daily.   Magnesium 250 MG Tabs Take 250 mg by mouth 2 (two) times daily.   memantine 10 MG tablet Commonly known as:  NAMENDA Take 10 mg by mouth 2 (two) times daily.   omeprazole 40 MG capsule Commonly known as:  PRILOSEC Take 40 mg by mouth daily.   oxyCODONE-acetaminophen 7.5-325 MG tablet Commonly known as:  PERCOCET Take 1 tablet by mouth every 4 (four) hours as needed.   Potassium Chloride ER 20 MEQ Tbcr Take 20 mEq by mouth daily.   pramipexole 0.5 MG tablet Commonly known as:  MIRAPEX Take 1 mg by mouth at bedtime.   pravastatin 80 MG tablet Commonly known as:  PRAVACHOL Take 80 mg by mouth at bedtime.   SYNTHROID 75 MCG tablet Generic drug:  levothyroxine Take 75 mcg by mouth daily before breakfast.   Vitamin D (Ergocalciferol) 50000 units Caps capsule Commonly known as:  DRISDOL Take 50,000 Units by  mouth every 30 (thirty) days. Patient takes this on first Friday of the month       Disposition   The patient will be discharged in stable condition to home. Discharge Instructions    (HEART FAILURE PATIENTS) Call MD:  Anytime you have any of the following symptoms: 1) 3 pound weight gain in 24 hours or 5 pounds in 1 week  2) shortness of breath, with or without a dry hacking cough 3) swelling in the hands, feet or stomach 4) if you have to sleep on extra pillows at night in order to breathe.    Complete by:  As directed    Diet - low sodium heart healthy    Complete by:  As directed    Heart Failure patients record your daily weight using the same scale at the same time of day    Complete by:  As directed    Increase activity slowly    Complete by:  As directed      Follow-up Information    Loralie Champagne, MD Follow up on 05/25/2016.   Specialty:  Cardiology Why:  Garage Code [address] Contact information: [address] [place] [address] [phone]             Duration of Discharge Encounter: Greater than 35 minutes   Signed, Olsen Mccutchan NP-C  05/25/2016, 1:24 PM

## 2016-05-25 NOTE — Progress Notes (Signed)
Patient ID: [name], female   DOB: [birth year], 70 y.o.   MRN: [id number]   SUBJECTIVE: No further chest pain.  Lasix held yesterday and she was gently hydrated. Creatinine trend 2.1=> 1.85=> 1.7 => 2.0 => 1.7.   RHC/LHC 05/25/16: Coronary Findings   Dominance: Right  Left Main  No significant coronary disease.  Left Anterior Descending  No significant coronary disease.  Left Circumflex  30% proximal LCx stenosis.  Right Coronary Artery  No significant coronary disease.  Right Heart   Right Heart Pressures RHC Procedural Findings: Hemodynamics (mmHg) RA mean 4 RV 21/6 PA 25/11, mean 16 PCWP mean 7 LV 102/17 AO 100/42  Oxygen saturations: PA 64% AO 98%  Cardiac Output (Fick) 5.32  Cardiac Index (Fick) 2.42       Scheduled Meds: . [MAR Hold] amLODipine  5 mg Oral Daily  . [MAR Hold] aspirin EC  81 mg Oral QHS  . [MAR Hold] baclofen  10 mg Oral TID  . [MAR Hold] donepezil  10 mg Oral Daily  . [MAR Hold] escitalopram  30 mg Oral Q supper  . [MAR Hold] fentaNYL  25 mcg Transdermal Q48H  . [MAR Hold] folic acid  1 mg Oral BID  . [MAR Hold] heparin  5,000 Units Subcutaneous Q8H  . [MAR Hold] isosorbide mononitrate  30 mg Oral Daily  . [MAR Hold] lamoTRIgine  100 mg Oral BID  . [MAR Hold] levothyroxine  75 mcg Oral QAC breakfast  . [MAR Hold] magnesium oxide  200 mg Oral BID  . [MAR Hold] memantine  10 mg Oral BID  . [MAR Hold] pantoprazole  40 mg Oral Daily  . [MAR Hold] pramipexole  0.5 mg Oral QHS  . [MAR Hold] pravastatin  80 mg Oral QHS  . sodium chloride flush  3 mL Intravenous Q12H   Continuous Infusions: . sodium chloride    . sodium chloride 75 mL/hr at 05/24/16 1430  . [MAR Hold] nitroGLYCERIN     PRN Meds:.sodium chloride, [MAR Hold] acetaminophen, [MAR Hold] albuterol, [MAR Hold] benzonatate, [MAR Hold] nitroGLYCERIN, [MAR Hold] ondansetron (ZOFRAN) IV, [MAR Hold] oxyCODONE-acetaminophen, sodium chloride flush    Vitals:   05/25/16 0813  05/25/16 0818 05/25/16 0823 05/25/16 0828  BP: (!) 105/47 (!) 109/53 (!) 118/57 (!) 104/50  Pulse: (!) 48 (!) 49 (!) 55 (!) 54  Resp: 12 13 (!) 21 19  Temp:      TempSrc:      SpO2: 96% 95% 98% 97%  Weight:      Height:        Intake/Output Summary (Last 24 hours) at 05/25/16 0842 Last data filed at 05/25/16 0415  Gross per 24 hour  Intake          2471.25 ml  Output             2350 ml  Net           121.25 ml    LABS: Basic Metabolic Panel:  Recent Labs  05/24/16 0412 05/25/16 0446  NA 135 134*  K 3.8 3.5  CL 94* 97*  CO2 31 27  GLUCOSE 90 95  BUN 37* 37*  CREATININE 2.06* 1.74*  CALCIUM 10.1 9.5   Liver Function Tests: No results for input(s): AST, ALT, ALKPHOS, BILITOT, PROT, ALBUMIN in the last 72 hours. No results for input(s): LIPASE, AMYLASE in the last 72 hours. CBC:  Recent Labs  05/25/16 0446  WBC 6.8  HGB 11.9*  HCT 37.7  MCV  95.4  PLT 195   Cardiac Enzymes: No results for input(s): CKTOTAL, CKMB, CKMBINDEX, TROPONINI in the last 72 hours. BNP: Invalid input(s): POCBNP D-Dimer: No results for input(s): DDIMER in the last 72 hours. Hemoglobin A1C: No results for input(s): HGBA1C in the last 72 hours. Fasting Lipid Panel: No results for input(s): CHOL, HDL, LDLCALC, TRIG, CHOLHDL, LDLDIRECT in the last 72 hours. Thyroid Function Tests: No results for input(s): TSH, T4TOTAL, T3FREE, THYROIDAB in the last 72 hours.  Invalid input(s): FREET3 Anemia Panel: No results for input(s): VITAMINB12, FOLATE, FERRITIN, TIBC, IRON, RETICCTPCT in the last 72 hours.  RADIOLOGY: Dg Chest 2 View  Result Date: 05/21/2016 CLINICAL DATA:  70 year old with acute onset of chest pain at 10:30 a.m. today. Current history of hypertension and CHF. EXAM: CHEST  2 VIEW COMPARISON:  05/04/2016, 03/27/2016 and earlier, including CTA chest 03/19/2016. FINDINGS: Cardiac silhouette mildly enlarged, unchanged dating back to May, 2016. Thoracic aorta atherosclerotic,  unchanged. Hilar and mediastinal contours otherwise unremarkable. Pulmonary venous hypertension without overt edema currently. Lungs clear. No pleural effusions. No pneumothorax. Mild degenerative changes involving the thoracic spine. Osseous demineralization. IMPRESSION: 1. Stable mild cardiomegaly. Pulmonary venous hypertension without overt edema. No acute cardiopulmonary disease. 2. Thoracic aortic atherosclerosis. Electronically Signed   By: Evangeline Dakin M.D.   On: 05/21/2016 13:51   Dg Chest 2 View  Result Date: 05/04/2016 CLINICAL DATA:  70 year old female with history of coughing congestion since Friday. History of congestive heart failure and hypertension. EXAM: CHEST  2 VIEW COMPARISON:  Chest x-ray 03/27/2016. FINDINGS: Lung volumes are normal. Diffuse peribronchial cuffing. No consolidative airspace disease. No pleural effusions. No pneumothorax. No pulmonary nodule or mass noted. Pulmonary vasculature and the cardiomediastinal silhouette are within normal limits. Atherosclerosis in the thoracic aorta. Surgical clips project over the right upper quadrant of the abdomen, likely from prior cholecystectomy. IMPRESSION: 1. Mild diffuse peribronchial cuffing, concerning for an acute bronchitis. 2. Atherosclerosis. Electronically Signed   By: Vinnie Langton M.D.   On: 05/04/2016 18:10    PHYSICAL EXAM General: NAD Neck: JVP 7 cm, no thyromegaly or thyroid nodule.  Lungs: Clear to auscultation bilaterally with normal respiratory effort. CV: Nondisplaced PMI.  Heart regular S1/S2, no S3/S4, no murmur.  Lymphedema noted in lower legs but no pitting edema.   Abdomen: Soft, nontender, no hepatosplenomegaly, no distention.  Neurologic: Alert and oriented x 3.  Psych: Normal affect. Extremities: No clubbing or cyanosis.   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  1. Chest pain: She had a negative cath in 2009.  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  Prior to admission, she had a severe/prolonged episode of chest pain with resolution with NTG.  Cardiac enzymes negative, ECG unchanged. Concern for unstable angina.  Coronary angiography was done, no obstructive CAD.  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 2. Chronic diastolic CHF: She was told by nephrology to increase Lasix to 80 mg bid and to take metolazone x 3 days in a row recently by nephrology.  Creatinine was up to 2.11 from baseline 1.4-1.5.  On exam today, she does not appear volume overloaded.  Volume status can be difficult to ascertain due to her baseline lymphedema. RHC today suggested euvolemia.  - Gently hydration post-cath.  - Resume  Lasix at lower dose tomorrow, 40 mg bid with KCl 20 daily.    3. AKI on CKD stage III: As above, creatinine up from baseline initially with increased diuresis.  Volume status can be difficult to ascertain with lymphedema.  RHC did not suggest volume overload.  She was gently hydrated yesterday, we used about 45 cc contrast with cath.  Will give gentle hydration post-cath.  - Cut back on diuretics at home.   4. She may go home today.  Followup in 2 wks.  Needs BMET on Thursday.  meds for discharge: Lasix 40 mg po bid, KCl 20 daily, Imdur 30 daily, ASA 81, amlodipine 5 daily, pravastatin 80 daily, noncardiac meds as prior to admission.   Loralie Champagne 05/25/2016 8:42 AM

## 2016-05-26 ENCOUNTER — Telehealth (HOSPITAL_COMMUNITY): Payer: Self-pay | Admitting: Cardiology

## 2016-05-26 MED FILL — Nitroglycerin IV Soln 100 MCG/ML in D5W: INTRA_ARTERIAL | Qty: 10 | Status: AC

## 2016-05-26 MED FILL — Heparin Sodium (Porcine) 2 Unit/ML in Sodium Chloride 0.9%: INTRAMUSCULAR | Qty: 500 | Status: AC

## 2016-05-26 NOTE — Telephone Encounter (Signed)
Please clarify which wraps you recommended for this patient.  [facility] nurse is in the home and was told by patient that [facility] wanted her to d/c ted hose and current treatment to start a lymphedema wrap.  [phone]  [place]   Per [place] for patient to begin unna boots per Dr Aundra Dubin If needed can be referred back to lymphedema clinic

## 2016-05-27 ENCOUNTER — Telehealth (HOSPITAL_COMMUNITY): Payer: Self-pay

## 2016-05-27 NOTE — Telephone Encounter (Signed)
Patient calling to confirm upcoming appointments and plan of care. Advised she is getting her lab work done at home this Friday with [facility] RN, then post hosp apt with Dr. Aundra Dubin on the 17th at 2:20. Patient states her leg wraps fell off in less than 24 hrs that were ordered by Dr. Aundra Dubin. Has compression stockings that she has placed bilaterally that says are old but still fit tight (15-18 mmhg). Will place reminder on apt desk for [facility] apt to have new set of compression stockings ordered at this apt as she is unsure if these are tight enough or if she needs to go up to the next fit.  Advised hard to tell as these are 70 yr old stockings and unsure how "worn out" they are. Patient states she is doing well otherwise and no other questions/concerns/needs at this time.  Renee Pain, RN

## 2016-05-27 NOTE — Telephone Encounter (Signed)
Verbal given for bmet ordered be [facility], Angie, RN will get 10/6  LE wraps- Wraps placed on 10/3 have already come off during the night pts requested wraps up to her thighs, unable to get unna boot wraps that high Pt was advised by [facility] RN to start compression stocking (thigh high light-medium compression) legs are smooth, swelling has really come down, pt is at her dry weight   Pt agreeable to this, will try to add padding at the ankles as the hose begin to cut into her skin as needed. She is unable to return to lymphedema clinic as she has a past balance

## 2016-05-28 ENCOUNTER — Telehealth (HOSPITAL_COMMUNITY): Payer: Self-pay

## 2016-05-28 NOTE — Telephone Encounter (Signed)
Patient calling CHF clinic to report 4 lb weight gain (251 lb to 255 lbs) overnight with swelling in belly and legs bilaterally below the knees. Denies SOB, CP, or any other s/s. Per Dr. Haroldine Laws, advised to "double up" on lasix today (usually takes 40 mg BID, advised to take 80 mg BID today), and take an extra potassium tablet (usually takes K 20 meq once daily, advised to take 20 meq twice daily). Advised to call us back tomorrow morning to report her weight when she gets up to see if this has improved fluid status. Reached out to daughter to make her aware of plan of care per patient request, and daughter staes patient has minimal swelling in her ankles, and thinks her mom overanalyzes CHF symptoms and.  States she was instructed to drink no more than 2L per day, which her mother has never drank a lot of fluids.  However, patient understood this to mean to drink up to 2L every day.  Patient drank this much yesterday, which was more than her usual fluid intake as she thought instructions told her she had to meet this fluid "goal", and daughter thinks this is the reason for the 3-4 lb weight gain overnight. Advised daughter of the plan of care, and to reassure the patient that this is a cut off guideline rather than a goal to reach as far as the 2L fluid restriction. Will place a note in patient's chart to flag clinical staff to reach out to daughter for any updates or triage calls with daughter as she manages patient and states patient's information may not be reliable as she also has dementia. Thanked daughter for input and dedication to her role in mother's plan of care.  Renee Pain, RN

## 2016-06-04 ENCOUNTER — Telehealth (HOSPITAL_COMMUNITY): Payer: Self-pay

## 2016-06-04 ENCOUNTER — Telehealth: Payer: Self-pay | Admitting: Cardiology

## 2016-06-04 NOTE — Telephone Encounter (Signed)
Spoke with patient's daughter regarding previous call this morning about patients weight gain and dyspnea. Daughter confirms patient had 2 lb weight gain overnight, swelling in left leg, and slightly more SOB with a dry cough. Advised per Dr. Aundra Dubin to take 80 mg lasix now instead of the normal 40 mg pm dose and call back in am to update weight and patient status. Will add bmet and bnp to patient's upcoming apt on the 17th as patient continues to fluctuate in volume and now c/o low UOP.  Renee Pain, RN

## 2016-06-04 NOTE — Telephone Encounter (Signed)
Will forward this message to the appropriate party. Pt is established with Dr Aundra Dubin, and is closely followed by him in CHF clinic.  CHF clinic to follow-up with the pt accordingly, based on her complaints mentioned.

## 2016-06-04 NOTE — Telephone Encounter (Signed)
New Message:     Kieth Brightly from [place] at [facility] called and said: Pt has about a 3lbs weight gain,increased swelling in her ankles.Pt can not breathe lying down,pt have to sit up to breathe. She also has a dry cough today,please call to advise.

## 2016-06-09 ENCOUNTER — Ambulatory Visit (HOSPITAL_COMMUNITY)
Admission: RE | Admit: 2016-06-09 | Discharge: 2016-06-09 | Disposition: A | Discharge status: Home / Self Care | Payer: Medicare Other | Source: Ambulatory Visit | Attending: Cardiology | Admitting: Cardiology

## 2016-06-09 ENCOUNTER — Encounter (HOSPITAL_COMMUNITY): Payer: Self-pay

## 2016-06-09 VITALS — BP 142/62 | HR 63 | Wt 250.0 lb

## 2016-06-09 DIAGNOSIS — Z79899 Other long term (current) drug therapy: Secondary | ICD-10-CM | POA: Insufficient documentation

## 2016-06-09 DIAGNOSIS — I890 Lymphedema, not elsewhere classified: Secondary | ICD-10-CM | POA: Diagnosis not present

## 2016-06-09 DIAGNOSIS — Z6841 Body Mass Index (BMI) 40.0 and over, adult: Secondary | ICD-10-CM | POA: Diagnosis not present

## 2016-06-09 DIAGNOSIS — F0390 Unspecified dementia without behavioral disturbance: Secondary | ICD-10-CM | POA: Insufficient documentation

## 2016-06-09 DIAGNOSIS — N183 Chronic kidney disease, stage 3 unspecified: Secondary | ICD-10-CM

## 2016-06-09 DIAGNOSIS — E669 Obesity, unspecified: Secondary | ICD-10-CM | POA: Insufficient documentation

## 2016-06-09 DIAGNOSIS — I5032 Chronic diastolic (congestive) heart failure: Secondary | ICD-10-CM | POA: Insufficient documentation

## 2016-06-09 DIAGNOSIS — M797 Fibromyalgia: Secondary | ICD-10-CM | POA: Diagnosis not present

## 2016-06-09 DIAGNOSIS — Z7982 Long term (current) use of aspirin: Secondary | ICD-10-CM | POA: Diagnosis not present

## 2016-06-09 DIAGNOSIS — R079 Chest pain, unspecified: Secondary | ICD-10-CM | POA: Insufficient documentation

## 2016-06-09 DIAGNOSIS — E6609 Other obesity due to excess calories: Secondary | ICD-10-CM

## 2016-06-09 DIAGNOSIS — I130 Hypertensive heart and chronic kidney disease with heart failure and stage 1 through stage 4 chronic kidney disease, or unspecified chronic kidney disease: Secondary | ICD-10-CM | POA: Insufficient documentation

## 2016-06-09 LAB — BASIC METABOLIC PANEL
Anion gap: 8 (ref 5–15)
BUN: 40 mg/dL — ABNORMAL HIGH (ref 6–20)
CALCIUM: 10.5 mg/dL — AB (ref 8.9–10.3)
CHLORIDE: 102 mmol/L (ref 101–111)
CO2: 30 mmol/L (ref 22–32)
CREATININE: 1.86 mg/dL — AB (ref 0.44–1.00)
GFR calc non Af Amer: 26 mL/min — ABNORMAL LOW (ref >60)
GFR, EST AFRICAN AMERICAN: 30 mL/min — AB (ref >60)
Glucose, Bld: 95 mg/dL (ref 65–99)
Potassium: 5.1 mmol/L (ref 3.5–5.1)
SODIUM: 140 mmol/L (ref 135–145)

## 2016-06-09 MED ORDER — FUROSEMIDE 40 MG PO TABS: 40.0000 mg | ORAL_TABLET | Freq: Two times a day (BID) | ORAL | Status: DC

## 2016-06-09 NOTE — Patient Instructions (Addendum)
Stop Magnesium  Lab today  You have been referred to Nutrition   We will contact you in 3 months to schedule your next appointment.

## 2016-06-10 NOTE — Progress Notes (Signed)
Advanced Heart Failure Clinic Note   Primary Care: Dr. Edrick Oh Primary Cardiologist: Dr. Angelena Form Renal: Dr Joelyn Oms HF Cardiology: Dr. Aundra Dubin  HPI:  [name] is a 70 y.o. with history of chronic diastolic CHF (Echo [id number] LVEF 65%, Mild LAE), chronic lymphedema, chronic back pain, HTN, CKD stage 3, and fibromyalgia.  Recently established with  Dr Angelena Form and was referred to the HF clinic for med optimization.    Admitted several times in 02/2016 to [facility] with CHF and chest pain.  Has been tried on spironolactone + metolazone alone as diuretic regimen. Follows with renal as above.   Seen in [facility] clinic 04/16/16. Thought to have mild volume overload. Lasix dose increased to 40 mg daily.  She was admitted in 9/17 with chest pain and AKI.  She had seen nephrology and was given metolazone to take daily along with Lasix, creatinine rose considerably.  Lasix was decreased to 40 mg bid.  When creatinine improved, she had right and left heart cath showing nonobstructive CAD and near-normal filling pressures.  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    She presents for followup.  Wearing thigh-high compression stockings.  Less chest pain now that she's taking Imdur.  Breathing is stable, not short of breath walking around the house. Working with PT.  SBP < 140 at home though mildly elevated here. Weight is down 5 lbs.   Labs (9/17): K 4.6, creatinine 1.55, BNP 63 Labs (10/17): K 4.7, creatinine 1.8  Past Medical History 1. Chronic diastolic CHF - Echo [id number] LVEF 65%, Mild LAE - RHC (10/17): mean RA 4, PA 25/11, mean PCWP 5, CI 2.4.  2. CKD stage 3: Follows with nephology. Baseline appears to be creatinine 1.4-1.6.  3. Chronic lymphedema: Has had wraps.  4. HTN 5. Dementia 6. Fibromyalgia 7. Microvascular angina:  - Normal coronaries on Cath 2009 - Stress Myoview 04/09/16 with no ischemia - LHC (10/17) with mild nonobstructive disease.    FH: Mother with CHF, brother with "heart disease."  Another brother with h/o MI.   Current Outpatient Prescriptions  Medication Sig Dispense Refill  . albuterol (PROVENTIL HFA;VENTOLIN HFA) 108 (90 Base) MCG/ACT inhaler Inhale 1-2 puffs into the lungs every 6 (six) hours as needed for wheezing or shortness of breath. 1 Inhaler 0  . alendronate (FOSAMAX) 70 MG tablet Take 70 mg by mouth every Monday. Take with a full glass of water on an empty stomach.     Marland Kitchen amLODipine (NORVASC) 5 MG tablet Take 5 mg by mouth Daily.     Marland Kitchen aspirin 81 MG tablet Take 81 mg by mouth at bedtime.    . baclofen (LIORESAL) 10 MG tablet Take 10 mg by mouth 3 (three) times daily.    Marland Kitchen donepezil (ARICEPT) 10 MG tablet Take 10 mg by mouth daily.     Marland Kitchen escitalopram (LEXAPRO) 20 MG tablet Take 30 mg by mouth daily with supper.     . fentaNYL (DURAGESIC - DOSED MCG/HR) 25 MCG/HR patch Place 1 patch onto the skin every 3 (three) days. EVERY 48 HOURS    . folic acid (FOLVITE) 1 MG tablet Take 1 mg by mouth 2 (two) times daily.    . furosemide (LASIX) 40 MG tablet Take 1 tablet (40 mg total) by mouth 2 (two) times daily. 30 tablet   . isosorbide mononitrate (IMDUR) 30 MG 24 hr tablet Take 30 mg by mouth daily.    Marland Kitchen lamoTRIgine (LAMICTAL) 100 MG tablet Take  100 mg by mouth 2 (two) times daily.     Marland Kitchen levothyroxine (SYNTHROID) 75 MCG tablet Take 75 mcg by mouth daily before breakfast.     . memantine (NAMENDA) 10 MG tablet Take 10 mg by mouth 2 (two) times daily.    Marland Kitchen omeprazole (PRILOSEC) 40 MG capsule Take 40 mg by mouth daily.     . potassium chloride 20 MEQ TBCR Take 20 mEq by mouth daily. 30 tablet 6  . pramipexole (MIRAPEX) 0.5 MG tablet Take 1 mg by mouth at bedtime.    . pravastatin (PRAVACHOL) 80 MG tablet Take 80 mg by mouth at bedtime.     . Vitamin D, Ergocalciferol, (DRISDOL) 50000 UNITS CAPS Take 50,000 Units by mouth every 30 (thirty) days. Patient takes this on first Friday of the month    .  oxyCODONE-acetaminophen (PERCOCET) 7.5-325 MG tablet Take 1 tablet by mouth every 4 (four) hours as needed.     No current facility-administered medications for this encounter.     Allergies  Allergen Reactions  . Oxycodone Itching and Hives    Other reaction(s): Delusions (intolerance)  . Hydromorphone Other (See Comments)    Hallucinations, altered mental state      Social History   Social History  . Marital status: Widowed    Spouse name: N/A  . Number of children: 3  . Years of education: N/A   Occupational History  . retired    Social History Main Topics  . Smoking status: Never Smoker  . Smokeless tobacco: Never Used  . Alcohol use No  . Drug use: No  . Sexual activity: Not Currently   Other Topics Concern  . Not on file   Social History Narrative   Pt does use caffeine. Lives alone. 3 children. Retired.    Vitals:   06/09/16 1432  BP: (!) 142/62  Pulse: 63  SpO2: 100%  Weight: 250 lb (113.4 kg)   Wt Readings from Last 3 Encounters:  06/09/16 250 lb (113.4 kg)  05/25/16 249 lb 9.6 oz (113.2 kg)  05/08/16 255 lb (115.7 kg)    PHYSICAL EXAM: General: NAD Neck: JVP 7 cm, no thyromegaly or thyroid nodule.  Lungs: Clear to auscultation bilaterally with normal respiratory effort. CV: Nondisplaced PMI.  Heart regular S1/S2, no S3/S4, no murmur.  Bilateral leg lymphedema, wearing compression stockings.  No carotid bruit.  Normal pedal pulses.  Abdomen: Soft, nontender, no hepatosplenomegaly, no distention.  Skin: Intact without lesions or rashes.  Neurologic: Alert and oriented x 3.  Psych: Normal affect. Extremities: No clubbing or cyanosis.  HEENT: Normal.   ASSESSMENT & PLAN: 1. Chronic diastolic CHF: Several admissions this year with CHF.  Exam complicated by lymphedema. I do not think that she is volume overloaded on exam. NYHA class III symptoms, likely potentiated as much by obesity/deconditioning as by CHF. - Keep Lasix at 40 mg bid. BMET today.   2. Lymphedema: Uses thigh-high compression stockings.   3. CKD: Stage III.  BMET today.   4. 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  She has nitrate-responsive CP but cath in 10/17 showed no obstructive CAD.   - Continue Imdur, can increase if chest pain returns/worsens.  5. Obesity: Nutrition referral to discuss weight loss.   Loralie Champagne 06/10/2016

## 2016-06-11 ENCOUNTER — Telehealth (HOSPITAL_COMMUNITY): Payer: Self-pay | Admitting: *Deleted

## 2016-06-11 ENCOUNTER — Telehealth (HOSPITAL_COMMUNITY): Payer: Self-pay

## 2016-06-11 NOTE — Telephone Encounter (Signed)
Patient called to CHF clinic triage to ask for lab results and for compression hose to be faxed to [facility] as she did not get the Rx from her OV with Dr. Aundra Dubin the other day. Dr. Aundra Dubin advised to stop potassium (was taking 20 meq once daily) for serum K of 5.1 Dr. Aundra Dubin also wrote Rx for compression stockings 15-49mmHg, faxed to [facility] per patient request. Also will fax order to redraw BMET next week through [facility] RN with [facility] per patient request.  Renee Pain, RN

## 2016-06-11 NOTE — Telephone Encounter (Signed)
Notes Recorded by Harvie Junior, [place] on 06/11/2016 at 4:31 PM EDT Patient aware. Order faxed to [facility] for labs to be drawn in 1 week  ------  Notes Recorded by Larey Dresser, MD on 06/11/2016 at 4:10 PM EDT Stop KCl, repeat BMET 1 week.    Ref Range & Units 2d ago (06/09/16) 2wk ago (05/25/16) 2wk ago (05/24/16)   Sodium 135 - 145 mmol/L 140  134   135    Potassium 3.5 - 5.1 mmol/L 5.1  3.5  3.8    Chloride 101 - 111 mmol/L 102  97   94     CO2 22 - 32 mmol/L 30  27  31     Glucose, Bld 65 - 99 mg/dL 95  95  90    BUN 6 - 20 mg/dL 40   37   37     Creatinine, Ser 0.44 - 1.00 mg/dL 1.86   1.74   2.06     Calcium 8.9 - 10.3 mg/dL 10.5   9.5  10.1    GFR calc non Af Amer >60 mL/min 26   28   23      GFR calc Af Amer >60 mL/min 30   33CM   27CM    Comments: (NOTE)

## 2016-06-11 NOTE — Telephone Encounter (Signed)
-----   Message from Larey Dresser, MD sent at 06/11/2016  4:10 PM EDT ----- Stop KCl, repeat BMET 1 week.

## 2016-06-16 NOTE — Telephone Encounter (Signed)
Per Darlina Guys, [facility] representative Patient is not active with [facility], will either need order sent to correct [facility] agency or new start of care orders sent to [facility].  LMOM for patient to clarify  correct HH or schedule lab appt

## 2016-07-10 ENCOUNTER — Other Ambulatory Visit (HOSPITAL_COMMUNITY): Payer: Self-pay | Admitting: Cardiology

## 2016-07-20 ENCOUNTER — Ambulatory Visit: Payer: Medicare Other | Admitting: Nutrition

## 2016-07-24 ENCOUNTER — Telehealth (HOSPITAL_COMMUNITY): Payer: Self-pay

## 2016-07-24 ENCOUNTER — Other Ambulatory Visit (HOSPITAL_COMMUNITY): Payer: Self-pay

## 2016-07-24 MED ORDER — FUROSEMIDE 40 MG PO TABS: 40.0000 mg | ORAL_TABLET | Freq: Two times a day (BID) | ORAL | 3 refills | Status: DC

## 2016-07-24 NOTE — Telephone Encounter (Signed)
Patient called CHF clinic with 3 lb weight gain overnight. No SOB, minimal swelling in feet, and otherwise feels fine. Advised per Dr. Haroldine Laws to take 1 extra 40 mg lasix tablet today. Take 40mg  twice daily otherwise. Advised to call after-hours if there is no improvement from this. Aware and agreeable to plan as stated above.  Renee Pain, RN

## 2016-07-28 ENCOUNTER — Encounter: Payer: Self-pay | Admitting: Nutrition

## 2016-07-28 ENCOUNTER — Encounter: Payer: Medicare Other | Attending: Cardiology | Admitting: Nutrition

## 2016-07-28 DIAGNOSIS — N183 Chronic kidney disease, stage 3 unspecified: Secondary | ICD-10-CM

## 2016-07-28 DIAGNOSIS — I5022 Chronic systolic (congestive) heart failure: Secondary | ICD-10-CM

## 2016-07-28 NOTE — Patient Instructions (Addendum)
Goals 1. Follow My Plate 2. Cut out snacks between meals 3. Cut out processed foods and high sodium foods 4. Increase fresh fruits and vegetables. 5. Drink 6-7 cups of water per day Lose 1-2 lbs per week Keep sodium  To 200 mg per serving or less/2000 mg per day Use Mrs. Dash and herbs and spices to season food  No SALT SUBSTITUTES

## 2016-07-28 NOTE — Progress Notes (Signed)
  Medical Nutrition Therapy:  Appt start time: 1500  end time:  1630.  Assessment:  Primary concerns today:  CHF. Lives by herself. Eats at home. Most foods are baked or nuwave. Rarely fries.  Eats 3 meals per day. CHF event in July 2017.  Took off 16 lbs of fluid at [facility] back in July 2017,.  Stg 3 CKD. Tends to eat 2-3 meals per day and snacks often. Mobility is limited. Walks with a walker. Her daughter is here with her.  Denies DM. No labs for review.  Diet is excessive to meet her needs at this time. Has been using salt substitute- No salt and some Mrs. Dash. Avoiding canned and processed foods typically. Needs more fresh fruits, whole grains and low sodium vegetables.   Goals 1. Follow My Plate 2. Cut out snacks between meals 3. Cut out processed foods and high sodium foods 4. Increase fresh fruits and vegetables. 5. Drink 6-7 cups of water per day Lose 1-2 lbs per week Keep sodium  To 200 mg per serving or less/2000 mg per day Use Mrs. Dash and herbs and spices to season food  No SALT SUBSTITUTES   Preferred Learning Style:  No preference indicated   Learning Readiness:  Ready  Change in progress   MEDICATIONS: See list   DIETARY INTAKE:  24-hr recall:  B ( AM): Eggs 1 , 1 packet instant grit, water or SF tea Snk ( AM):  L ( PM): Grilled chicken sandwich, sf tea and fries from [employer], unsweet tea Snk ( PM): none D ( PM): Chicken tenders and baked sweet potato, OR stew beef, green beans, and sweet  unsweet tea Snk ( PM):sf pudding or jello Beverages: water, unsweet tea,  Usual physical activity: ADL  Estimated energy needs: 1200  calories 135 g carbohydrates 90 g protein 33 g fat  Progress Towards Goal(s):  In progress.   Nutritional Diagnosis: Excessive sodium intake related to higher sodium diet as evidenced by CHF event.     Intervention:  Heart Healthy Low Sodium High Fiber Low Fat Diet, portion sizes, meal planning, reading food labels, avoiding  foods more than 200 mg per serving, herbs and spices for cooking, avoiding canned and processed foods..  Teaching Method Utilized:  Visual Auditory Hands on  Handouts given during visit include:  Low Sodium Diet  Plate Method  Meal Plan Card  Barriers to learning/adherence to lifestyle change: None Demonstrated degree of understanding via:  Teach Back   Monitoring/Evaluation:  Dietary intake, exercise, meal planning, and body weight in 2 month(s).

## 2016-07-30 ENCOUNTER — Other Ambulatory Visit: Payer: Self-pay | Admitting: Family Medicine

## 2016-07-30 DIAGNOSIS — Z1231 Encounter for screening mammogram for malignant neoplasm of breast: Secondary | ICD-10-CM

## 2016-09-06 ENCOUNTER — Emergency Department (HOSPITAL_COMMUNITY)
Admission: EM | Admit: 2016-09-06 | Discharge: 2016-09-06 | Disposition: A | Discharge status: Home / Self Care | Payer: Medicare Other | Attending: Emergency Medicine | Admitting: Emergency Medicine

## 2016-09-06 ENCOUNTER — Encounter (HOSPITAL_COMMUNITY): Payer: Self-pay | Admitting: *Deleted

## 2016-09-06 ENCOUNTER — Emergency Department (HOSPITAL_COMMUNITY): Payer: Medicare Other

## 2016-09-06 ENCOUNTER — Telehealth: Payer: Self-pay | Admitting: Physician Assistant

## 2016-09-06 DIAGNOSIS — Z8673 Personal history of transient ischemic attack (TIA), and cerebral infarction without residual deficits: Secondary | ICD-10-CM | POA: Diagnosis not present

## 2016-09-06 DIAGNOSIS — Z7982 Long term (current) use of aspirin: Secondary | ICD-10-CM | POA: Diagnosis not present

## 2016-09-06 DIAGNOSIS — E039 Hypothyroidism, unspecified: Secondary | ICD-10-CM | POA: Insufficient documentation

## 2016-09-06 DIAGNOSIS — I110 Hypertensive heart disease with heart failure: Secondary | ICD-10-CM | POA: Diagnosis not present

## 2016-09-06 DIAGNOSIS — R0602 Shortness of breath: Secondary | ICD-10-CM | POA: Diagnosis present

## 2016-09-06 DIAGNOSIS — I509 Heart failure, unspecified: Secondary | ICD-10-CM | POA: Diagnosis not present

## 2016-09-06 DIAGNOSIS — Z96653 Presence of artificial knee joint, bilateral: Secondary | ICD-10-CM | POA: Insufficient documentation

## 2016-09-06 LAB — CBC WITH DIFFERENTIAL/PLATELET
BASOS ABS: 0.1 10*3/uL (ref 0.0–0.1)
BASOS PCT: 1 %
Eosinophils Absolute: 0.4 10*3/uL (ref 0.0–0.7)
Eosinophils Relative: 4 %
HCT: 37.7 % (ref 36.0–46.0)
HEMOGLOBIN: 12.4 g/dL (ref 12.0–15.0)
Lymphocytes Relative: 27 %
Lymphs Abs: 2.7 10*3/uL (ref 0.7–4.0)
MCH: 30.8 pg (ref 26.0–34.0)
MCHC: 32.9 g/dL (ref 30.0–36.0)
MCV: 93.5 fL (ref 78.0–100.0)
Monocytes Absolute: 0.5 10*3/uL (ref 0.1–1.0)
Monocytes Relative: 5 %
NEUTROS ABS: 6.2 10*3/uL (ref 1.7–7.7)
NEUTROS PCT: 63 %
Platelets: 243 10*3/uL (ref 150–400)
RBC: 4.03 MIL/uL (ref 3.87–5.11)
RDW: 14.8 % (ref 11.5–15.5)
WBC: 9.9 10*3/uL (ref 4.0–10.5)

## 2016-09-06 LAB — BASIC METABOLIC PANEL
ANION GAP: 11 (ref 5–15)
BUN: 57 mg/dL — AB (ref 6–20)
CHLORIDE: 98 mmol/L — AB (ref 101–111)
CO2: 28 mmol/L (ref 22–32)
Calcium: 9.9 mg/dL (ref 8.9–10.3)
Creatinine, Ser: 2.05 mg/dL — ABNORMAL HIGH (ref 0.44–1.00)
GFR calc non Af Amer: 23 mL/min — ABNORMAL LOW (ref >60)
GFR, EST AFRICAN AMERICAN: 27 mL/min — AB (ref >60)
Glucose, Bld: 134 mg/dL — ABNORMAL HIGH (ref 65–99)
POTASSIUM: 4.7 mmol/L (ref 3.5–5.1)
SODIUM: 137 mmol/L (ref 135–145)

## 2016-09-06 LAB — HEPATIC FUNCTION PANEL
ALBUMIN: 3.7 g/dL (ref 3.5–5.0)
ALK PHOS: 108 U/L (ref 38–126)
ALT: 58 U/L — ABNORMAL HIGH (ref 14–54)
AST: 48 U/L — AB (ref 15–41)
Bilirubin, Direct: 0.1 mg/dL (ref 0.1–0.5)
Indirect Bilirubin: 0.4 mg/dL (ref 0.3–0.9)
TOTAL PROTEIN: 6.9 g/dL (ref 6.5–8.1)
Total Bilirubin: 0.5 mg/dL (ref 0.3–1.2)

## 2016-09-06 LAB — I-STAT TROPONIN, ED: TROPONIN I, POC: 0.01 ng/mL (ref 0.00–0.08)

## 2016-09-06 LAB — BRAIN NATRIURETIC PEPTIDE: B Natriuretic Peptide: 43.9 pg/mL (ref 0.0–100.0)

## 2016-09-06 MED ORDER — ROPINIROLE HCL 0.5 MG PO TABS
0.5000 mg | ORAL_TABLET | Freq: Once | ORAL | Status: AC
  Administered 2016-09-06: 0.5 mg via ORAL
  Filled 2016-09-06: qty 1

## 2016-09-06 MED ORDER — FUROSEMIDE 10 MG/ML IJ SOLN
40.0000 mg | Freq: Once | INTRAMUSCULAR | Status: AC
  Administered 2016-09-06: 40 mg via INTRAVENOUS
  Filled 2016-09-06: qty 4

## 2016-09-06 NOTE — ED Triage Notes (Addendum)
Pt reports recent cough, sob, abd swelling and leg swelling. Cough noted at triage. spo2 96% and ekg done at triage. Has mild chest pain. Hx of chf.

## 2016-09-06 NOTE — Telephone Encounter (Signed)
71 y.o. female with diastolic CHF. Patient's daughter Vicente Males Bullins) called. Ms. Sulek has gained 6 lbs and has been short of breath with a nonproductive cough, + orthopnea.  She took extra Lasix yesterday with minimal weight loss. Today she complains of chest pain. PLAN: I advised her daughter to bring her to the ED at [facility] for eval. She agrees with this plan. Richardson Dopp, PA-C   09/06/2016 9:45 AM

## 2016-09-06 NOTE — ED Notes (Signed)
Called lab re: CBC, was told tube was clotted and had notified T. Gross.

## 2016-09-06 NOTE — ED Notes (Signed)
Bladder Scam completed: 555mL

## 2016-09-06 NOTE — ED Provider Notes (Signed)
[facility] DEPT Provider Note   CSN: [id number] Arrival date & time: 09/06/16  1145  History   Chief Complaint Chief Complaint  Patient presents with  . Shortness of Breath   HPI [name] is a 71 y.o. female.  HPI   Patient has multiple past medical history problems including anemia, anxiety, arthritis, history of chest pain, CHF, dumping syndrome, restless leg, hypertension comes to the ER with complaints of shortness of breath. Facet over the past few days she has been becoming increasingly short of breath. She endorses swelling of her lower extremities where she is now having pain, bloating of her stomach and exertional shortness of breath. Denies having any chest pain, cough or fever. Denies having any wheezing. She states that she has a history of CHF and this is happening before, this is what it feels like when she has her exacerbations needing 10 lbs of fluids pulled off in the past. She takes lasix, says that she also has not been urinating or drinking fluids.  Past Medical History:  Diagnosis Date  . Anemia   . Anxiety   . Arthritis   . Brain lesion    2 types  . Cervical spondylosis with myelopathy   . Chest pain    Normal cardiac cath 5/09  . Congestive heart failure (CHF) ([place])   . Depression   . Dumping syndrome   . Edema   . Fatigue   . Fibromyalgia   . History of echocardiogram    a. Echo 03/20/16 (done at [facility] in [place], [place]):  mild LVH, EF 25%, normal diastolic function, mild LAE, MAC, RVSP 25 mmHg  . Hyperlipidemia   . Hypertension   . Hypothyroidism   . Lumbar spondylosis   . Lymphedema    seeing specialist for this  . Restless leg syndrome   . Rotator cuff injury   . Stroke [facility])    TIAs "mini strokes"- unsure of last TIA  . Syncope   . Tremors of nervous system     Patient Active Problem List   Diagnosis Date Noted  . Unstable angina ([place]) 05/21/2016  . Chronic diastolic CHF (congestive heart failure) ([place]) 03/29/2016  .  Normal coronary arteries 2009 01/14/2015  . Obesity-BMI 40 01/14/2015  . Restless leg 01/14/2015  . Chest pain 01/14/2015  . Back pain 01/14/2015  . Renal insufficiency 01/14/2015  . Dementia- mild memory issues 01/14/2015  . Occult blood positive stool 12/14/2014  . Anemia 12/14/2014  . Family history of colon cancer 12/14/2014  . Change in bowel habits 12/14/2014  . GERD (gastroesophageal reflux disease) 12/14/2014  . Dyspnea 05/17/2012  . Lymphedema 05/17/2012  . Weight gain 05/17/2012  . HTN (hypertension) 05/17/2012    Past Surgical History:  Procedure Laterality Date  . APPENDECTOMY  1966  . CARDIAC CATHETERIZATION N/A 05/25/2016   Procedure: Right/Left Heart Cath and Coronary Angiography;  Surgeon: Larey Dresser, MD;  Location: [facility] CV LAB;  Service: Cardiovascular;  Laterality: N/A;  . CHOLECYSTECTOMY  2015  . CHOLECYSTECTOMY  2015  . CORONARY ANGIOGRAM  2009   Normal coronaries  . Hip Bursa Surgery Bilateral   . KNEE ARTHROSCOPY Bilateral   . LUMBAR DISC SURGERY     x2  . LUMBAR FUSION    . ROTATOR CUFF REPAIR Bilateral   . TOTAL ABDOMINAL HYSTERECTOMY    . TOTAL KNEE ARTHROPLASTY Left   . TOTAL KNEE ARTHROPLASTY Right 11/18/2012   Procedure: TOTAL KNEE ARTHROPLASTY;  Surgeon: Patricia Nettle  Valeta Harms., MD;  Location: [facility];  Service: Orthopedics;  Laterality: Right;    OB History    No data available       Home Medications    Prior to Admission medications   Medication Sig Start Date End Date Taking? Authorizing Provider  alendronate (FOSAMAX) 70 MG tablet Take 70 mg by mouth every Friday. Take with a full glass of water on an empty stomach.    Yes Historical Provider, MD  amLODipine (NORVASC) 5 MG tablet Take 5 mg by mouth Daily.  04/28/12  Yes Historical Provider, MD  aspirin 81 MG tablet Take 81 mg by mouth at bedtime.   Yes Historical Provider, MD  baclofen (LIORESAL) 10 MG tablet Take 10 mg by mouth 3 (three) times daily.   Yes Historical Provider, MD    diclofenac (VOLTAREN) 75 MG EC tablet Take 75 mg by mouth 2 (two) times daily.   Yes Historical Provider, MD  donepezil (ARICEPT) 10 MG tablet Take 10 mg by mouth at bedtime.  11/28/14  Yes Historical Provider, MD  escitalopram (LEXAPRO) 20 MG tablet Take 30 mg by mouth daily with supper.  12/06/14  Yes Historical Provider, MD  fentaNYL (DURAGESIC - DOSED MCG/HR) 25 MCG/HR patch Place 25 mcg onto the skin every 3 (three) days.  07/22/15  Yes Historical Provider, MD  folic acid (FOLVITE) 1 MG tablet Take 1 mg by mouth 2 (two) times daily.   Yes Historical Provider, MD  furosemide (LASIX) 40 MG tablet Take 1 tablet (40 mg total) by mouth 2 (two) times daily. 07/24/16  Yes Jolaine Artist, MD  isosorbide mononitrate (IMDUR) 30 MG 24 hr tablet Take 30 mg by mouth daily. 03/28/16  Yes Historical Provider, MD  lamoTRIgine (LAMICTAL) 100 MG tablet Take 100 mg by mouth 2 (two) times daily.  11/03/14  Yes Historical Provider, MD  levothyroxine (SYNTHROID) 75 MCG tablet Take 75 mcg by mouth daily before breakfast.  11/21/15 11/20/16 Yes Historical Provider, MD  memantine (NAMENDA) 10 MG tablet Take 10 mg by mouth 2 (two) times daily. 04/06/16  Yes Historical Provider, MD  omeprazole (PRILOSEC) 40 MG capsule Take 40 mg by mouth daily.  12/04/14  Yes Historical Provider, MD  oxyCODONE-acetaminophen (PERCOCET) 7.5-325 MG tablet Take 1 tablet by mouth 3 (three) times daily as needed for moderate pain.    Yes Historical Provider, MD  pravastatin (PRAVACHOL) 80 MG tablet Take 80 mg by mouth at bedtime.  12/10/14  Yes Historical Provider, MD  rOPINIRole (REQUIP) 0.25 MG tablet Take 0.25 mg by mouth 2 (two) times daily.  09/01/16  Yes Historical Provider, MD  rOPINIRole (REQUIP) 1 MG tablet Take 1 mg by mouth at bedtime.   Yes Historical Provider, MD  sennosides-docusate sodium (SENOKOT-S) 8.6-50 MG tablet Take 2 tablets by mouth at bedtime.   Yes Historical Provider, MD  spironolactone (ALDACTONE) 25 MG tablet Take 25 mg by  mouth 2 (two) times daily.   Yes Historical Provider, MD  albuterol (PROVENTIL HFA;VENTOLIN HFA) 108 (90 Base) MCG/ACT inhaler Inhale 1-2 puffs into the lungs every 6 (six) hours as needed for wheezing or shortness of breath. 05/04/16   Domenic Moras, PA-C    Family History Family History  Problem Relation Age of Onset  . Heart disease Mother   . Heart failure Mother   . Kidney disease Mother   . Lung cancer Father   . Colon cancer Brother   . Cancer Brother     malignant thymoma  .  Heart disease Brother   . Tremor Maternal Grandmother 90  . Ovarian cancer Maternal Grandmother   . Uterine cancer Maternal Grandmother   . Irritable bowel syndrome Daughter   . Esophageal cancer Neg Hx   . Stomach cancer Neg Hx   . Rectal cancer Neg Hx     Social History Social History  Substance Use Topics  . Smoking status: Never Smoker  . Smokeless tobacco: Never Used  . Alcohol use No     Allergies   Oxycodone and Hydromorphone   Review of Systems Review of Systems  Review of Systems All other systems negative except as documented in the HPI. All pertinent positives and negatives as reviewed in the HPI.  Physical Exam Updated Vital Signs BP (!) 106/49   Pulse (!) 56   Temp 97.9 F (36.6 C) (Oral)   Resp 14   Ht _0  (1.676 m)   Wt 118.1 kg   SpO2 97%   BMI 42.01 kg/m   Physical Exam  Constitutional: She appears well-developed and well-nourished. No distress.  HENT:  Head: Normocephalic and atraumatic.  Nose: Nose normal.  Mouth/Throat: Uvula is midline, oropharynx is clear and moist and mucous membranes are normal.  Eyes: Pupils are equal, round, and reactive to light.  Neck: Normal range of motion. Neck supple.  Cardiovascular: Normal rate and regular rhythm.   Pulmonary/Chest: Effort normal. No accessory muscle usage. No tachypnea. No respiratory distress. She has decreased breath sounds. She has no wheezes. She has no rhonchi.  Abdominal: Soft.  Exam limited by body  habitus, pt says abdomen is more bloated than normal.  Musculoskeletal:  +LE swelling, exam limited by body habitus  Neurological: She is alert.  Acting at baseline  Skin: Skin is warm and dry. No rash noted.  Nursing note and vitals reviewed.   ED Treatments / Results  Labs (all labs ordered are listed, but only abnormal results are displayed) Labs Reviewed  BASIC METABOLIC PANEL - Abnormal; Notable for the following:       Result Value   Chloride 98 (*)    Glucose, Bld 134 (*)    BUN 57 (*)    Creatinine, Ser 2.05 (*)    GFR calc non Af Amer 23 (*)    GFR calc Af Amer 27 (*)    All other components within normal limits  CBC  BRAIN NATRIURETIC PEPTIDE  HEPATIC FUNCTION PANEL  I-STAT TROPOININ, ED    EKG  EKG Interpretation None       Radiology Dg Chest 2 View  Result Date: 09/06/2016 CLINICAL DATA:  Pt reports cough yesterday and SOB, mid-sternal chest tightness, abdominal distention, and BLE swelling x 2 days; Pt reports h/o DM and CHF; non-smoker EXAM: CHEST  2 VIEW COMPARISON:  05/21/2016 FINDINGS: Cardiac silhouette is borderline enlarged. No mediastinal or hilar masses. No evidence of adenopathy. Lungs are clear.  No pleural effusion.  No pneumothorax. Skeletal structures are intact. IMPRESSION: No active cardiopulmonary disease. Electronically Signed   By: Lajean Manes M.D.   On: 09/06/2016 13:17    Procedures Procedures (including critical care time)  Medications Ordered in ED Medications  rOPINIRole (REQUIP) tablet 0.5 mg (not administered)     Initial Impression / Assessment and Plan / ED Course  I have reviewed the triage vital signs and the nursing notes.  Pertinent labs & imaging results that were available during my care of the patient were reviewed by me and considered in my medical decision making (see  chart for details).  Clinical Course     Patient saying she typically cant urinate when she is fluid overload, bladder scanner shows > 500 mL  in bladder. Charge RN refuses to allow insertion of foley unless patient getting admitted.   Labs are currently pending to be drawn by phlebotomy, at end of shift patient sign out to Mayer Camel, MD.  Patient is likely dehydrated and fluid overload and may require admission.  Blood pressure (!) 106/49, pulse (!) 56, temperature 97.9 F (36.6 C), temperature source Oral, resp. rate 14, height _0  (1.676 m), weight 118.1 kg, SpO2 97 %.    Final Clinical Impressions(s) / ED Diagnoses   Final diagnoses:  None    New Prescriptions New Prescriptions   No medications on file     Earney Navy 09/06/16 [place], MD 09/08/16 2125

## 2016-09-11 ENCOUNTER — Inpatient Hospital Stay (HOSPITAL_COMMUNITY): Admission: RE | Admit: 2016-09-11 | Payer: Medicare Other | Source: Ambulatory Visit

## 2016-09-11 ENCOUNTER — Other Ambulatory Visit (HOSPITAL_COMMUNITY): Payer: Self-pay | Admitting: *Deleted

## 2016-09-11 MED ORDER — FUROSEMIDE 40 MG PO TABS: 80.0000 mg | ORAL_TABLET | Freq: Two times a day (BID) | ORAL | 3 refills | Status: DC

## 2016-09-14 ENCOUNTER — Ambulatory Visit: Payer: Medicare Other

## 2016-09-22 ENCOUNTER — Other Ambulatory Visit (HOSPITAL_COMMUNITY): Payer: Self-pay | Admitting: Adult Health

## 2016-09-22 ENCOUNTER — Encounter (HOSPITAL_COMMUNITY): Payer: Self-pay

## 2016-09-22 ENCOUNTER — Ambulatory Visit (HOSPITAL_COMMUNITY)
Admission: RE | Admit: 2016-09-22 | Discharge: 2016-09-22 | Disposition: A | Discharge status: Home / Self Care | Payer: Medicare Other | Source: Ambulatory Visit | Attending: Cardiology | Admitting: Cardiology

## 2016-09-22 VITALS — BP 118/52 | HR 70 | Wt 256.4 lb

## 2016-09-22 DIAGNOSIS — I890 Lymphedema, not elsewhere classified: Secondary | ICD-10-CM | POA: Diagnosis not present

## 2016-09-22 DIAGNOSIS — Z6841 Body Mass Index (BMI) 40.0 and over, adult: Secondary | ICD-10-CM | POA: Diagnosis not present

## 2016-09-22 DIAGNOSIS — Z7982 Long term (current) use of aspirin: Secondary | ICD-10-CM | POA: Insufficient documentation

## 2016-09-22 DIAGNOSIS — M797 Fibromyalgia: Secondary | ICD-10-CM | POA: Diagnosis not present

## 2016-09-22 DIAGNOSIS — R079 Chest pain, unspecified: Secondary | ICD-10-CM | POA: Diagnosis not present

## 2016-09-22 DIAGNOSIS — G8929 Other chronic pain: Secondary | ICD-10-CM | POA: Diagnosis not present

## 2016-09-22 DIAGNOSIS — I130 Hypertensive heart and chronic kidney disease with heart failure and stage 1 through stage 4 chronic kidney disease, or unspecified chronic kidney disease: Secondary | ICD-10-CM | POA: Insufficient documentation

## 2016-09-22 DIAGNOSIS — I2510 Atherosclerotic heart disease of native coronary artery without angina pectoris: Secondary | ICD-10-CM | POA: Insufficient documentation

## 2016-09-22 DIAGNOSIS — N183 Chronic kidney disease, stage 3 unspecified: Secondary | ICD-10-CM

## 2016-09-22 DIAGNOSIS — E669 Obesity, unspecified: Secondary | ICD-10-CM | POA: Diagnosis not present

## 2016-09-22 DIAGNOSIS — Z79899 Other long term (current) drug therapy: Secondary | ICD-10-CM | POA: Diagnosis not present

## 2016-09-22 DIAGNOSIS — I5032 Chronic diastolic (congestive) heart failure: Secondary | ICD-10-CM | POA: Insufficient documentation

## 2016-09-22 DIAGNOSIS — F0390 Unspecified dementia without behavioral disturbance: Secondary | ICD-10-CM | POA: Insufficient documentation

## 2016-09-22 LAB — BASIC METABOLIC PANEL
ANION GAP: 13 (ref 5–15)
BUN: 55 mg/dL — ABNORMAL HIGH (ref 6–20)
CHLORIDE: 95 mmol/L — AB (ref 101–111)
CO2: 28 mmol/L (ref 22–32)
Calcium: 10.2 mg/dL (ref 8.9–10.3)
Creatinine, Ser: 2.03 mg/dL — ABNORMAL HIGH (ref 0.44–1.00)
GFR calc Af Amer: 27 mL/min — ABNORMAL LOW (ref >60)
GFR calc non Af Amer: 24 mL/min — ABNORMAL LOW (ref >60)
GLUCOSE: 99 mg/dL (ref 65–99)
Potassium: 5.1 mmol/L (ref 3.5–5.1)
Sodium: 136 mmol/L (ref 135–145)

## 2016-09-22 MED ORDER — METOLAZONE 2.5 MG PO TABS: ORAL_TABLET | ORAL | 6 refills | Status: DC

## 2016-09-22 NOTE — Patient Instructions (Addendum)
Routine lab work today. Will notify you of abnormal results, otherwise no news is good news!  Take Metolazone 2.5 mg tablet TODAY, then up to once daily AS NEEDED for swelling, shortness of breath, weight gain. Take UP TO 4 times weekly (do not exceed this).  Follow up with Amy Clegg NP-C in 4 weeks.  Do the following things EVERYDAY: 1) Weigh yourself in the morning before breakfast. Write it down and keep it in a log. 2) Take your medicines as prescribed 3) Eat low salt foods-Limit salt (sodium) to 2000 mg per day.  4) Stay as active as you can everyday 5) Limit all fluids for the day to less than 2 liters

## 2016-09-22 NOTE — Progress Notes (Signed)
Advanced Heart Failure Clinic Note   Primary Care: Dr. Edrick Oh Primary Cardiologist: Dr. Angelena Form Renal: Dr Joelyn Oms HF Cardiology: Dr. Aundra Dubin  HPI: [name] is a 71 y.o. with history of chronic diastolic CHF (Echo 99991111 LVEF 65%, Mild LAE), chronic lymphedema, chronic back pain, HTN, CKD stage 3, and fibromyalgia.    Admitted several times in 02/2016 to [facility] with CHF and chest pain.  Has been tried on spironolactone + metolazone alone as diuretic regimen. Follows with renal as above.   Seen in [facility] clinic 04/16/16. Thought to have mild volume overload. Lasix dose increased to 40 mg daily.  She was admitted in 9/17 with chest pain and AKI.  She had seen nephrology and was given metolazone to take daily along with Lasix, creatinine rose considerably.  Lasix was decreased to 40 mg bid.  When creatinine improved, she had right and left heart cath showing nonobstructive CAD and near-normal filling pressures.  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    She freturns for HF follow up. Evaluated in [place] on 1/14 with HF exacerbation. In the ED she was diuresed with IV lasix and sent home. Earlier this morning she was evaluated by Dr Joelyn Oms. And lasix was increased to 80 mg twice a day metolazone as needed up 4 times a week. Weight at home 254-257 pounds. SOB with exertion. Uses lymph edema pumps daily. Ambulated with rolling walker.  Eats over more than 3 bananas a day.  Tries to follow low sal  diet. Taking all medications.    Labs (9/17): K 4.6, creatinine 1.55, BNP 63 Labs (10/17): K 4.7, creatinine 1.8 Labs ( 09/06/2016): K 3.7 Creatinine 2.05   Past Medical History 1. Chronic diastolic CHF - Echo 99991111 LVEF 65%, Mild LAE - [facility] (10/17): mean RA 4, PA 25/11, mean PCWP 5, CI 2.4.  2. CKD stage 3: Follows with nephology. Baseline appears to be creatinine 1.4-1.6.  3. Chronic lymphedema: Has had wraps.  4. HTN 5. Dementia 6.  Fibromyalgia 7. Microvascular angina:  - Normal coronaries on Cath 2009 - Stress Myoview 04/09/16 with no ischemia - LHC (10/17) with mild nonobstructive disease.   FH: Mother with CHF, brother with "heart disease."  Another brother with h/o MI.   Current Outpatient Prescriptions  Medication Sig Dispense Refill  . albuterol (PROVENTIL HFA;VENTOLIN HFA) 108 (90 Base) MCG/ACT inhaler Inhale 1-2 puffs into the lungs every 6 (six) hours as needed for wheezing or shortness of breath. 1 Inhaler 0  . alendronate (FOSAMAX) 70 MG tablet Take 70 mg by mouth every Friday. Take with a full glass of water on an empty stomach.     Marland Kitchen amLODipine (NORVASC) 5 MG tablet Take 5 mg by mouth Daily.     Marland Kitchen aspirin 81 MG tablet Take 81 mg by mouth at bedtime.    . baclofen (LIORESAL) 10 MG tablet Take 10 mg by mouth 3 (three) times daily.    Marland Kitchen donepezil (ARICEPT) 10 MG tablet Take 10 mg by mouth at bedtime.     Marland Kitchen escitalopram (LEXAPRO) 20 MG tablet Take 30 mg by mouth daily with supper.     . fentaNYL (DURAGESIC - DOSED MCG/HR) 25 MCG/HR patch Place 25 mcg onto the skin every 3 (three) days.     . folic acid (FOLVITE) 1 MG tablet Take 1 mg by mouth 2 (two) times daily.    . furosemide (LASIX) 40 MG tablet Take 2 tablets (80 mg total) by mouth 2 (  two) times daily. 120 tablet 3  . isosorbide mononitrate (IMDUR) 30 MG 24 hr tablet Take 30 mg by mouth daily.    Marland Kitchen lamoTRIgine (LAMICTAL) 100 MG tablet Take 100 mg by mouth 2 (two) times daily.     Marland Kitchen levothyroxine (SYNTHROID) 75 MCG tablet Take 75 mcg by mouth daily before breakfast.     . memantine (NAMENDA) 10 MG tablet Take 10 mg by mouth 2 (two) times daily.    Marland Kitchen omeprazole (PRILOSEC) 40 MG capsule Take 40 mg by mouth daily.     Marland Kitchen oxyCODONE-acetaminophen (PERCOCET) 7.5-325 MG tablet Take 1 tablet by mouth 3 (three) times daily as needed for moderate pain.     . pravastatin (PRAVACHOL) 80 MG tablet Take 80 mg by mouth at bedtime.     Marland Kitchen rOPINIRole (REQUIP) 1 MG tablet  Take 1 mg by mouth at bedtime.    . sennosides-docusate sodium (SENOKOT-S) 8.6-50 MG tablet Take 2 tablets by mouth at bedtime.    Marland Kitchen spironolactone (ALDACTONE) 25 MG tablet Take 25 mg by mouth 2 (two) times daily.     No current facility-administered medications for this encounter.     Allergies  Allergen Reactions  . Oxycodone Hives, Itching and Other (See Comments)    Other reaction(s): Delusions (intolerance)  . Hydromorphone Other (See Comments)    Hallucinations, altered mental state      Social History   Social History  . Marital status: Widowed    Spouse name: N/A  . Number of children: 3  . Years of education: N/A   Occupational History  . retired    Social History Main Topics  . Smoking status: Never Smoker  . Smokeless tobacco: Never Used  . Alcohol use No  . Drug use: No  . Sexual activity: Not Currently   Other Topics Concern  . Not on file   Social History Narrative   Pt does use caffeine. Lives alone. 3 children. Retired.    Vitals:   09/22/16 0923  BP: (!) 118/52  Pulse: 70  SpO2: 97%  Weight: 256 lb 7 oz (116.3 kg)   Wt Readings from Last 3 Encounters:  09/22/16 256 lb 7 oz (116.3 kg)  09/06/16 260 lb 4.8 oz (118.1 kg)  07/28/16 253 lb 9.6 oz (115 kg)    PHYSICAL EXAM: General: NAD. Ambulated in the clinic with rolling walker. Daughter rpesent  Neck: JVP  ~10 cm, no thyromegaly or thyroid nodule.  Lungs: CTAB. CV: Nondisplaced PMI.  Heart regular S1/S2, no S3/S4, no murmur.  Bilateral lymph edema.  No carotid bruit.  Normal pedal pulses.  Abdomen: Soft, nontender, no hepatosplenomegaly, no distention.  Skin: Intact without lesions or rashes. Warm Neurologic: Alert and oriented x 3.  Psych: Normal affect. Extremities: No clubbing or cyanosis. R and LLE 1+ edema.  HEENT: Normal.   ASSESSMENT & PLAN: 1. Chronic diastolic CHF:   NYHA class III symptoms. Continue lasix 80 mg twice a day + metolazone 2.5 mg as needed up to 4 times a week.   Check ECHO.  Do the following things EVERYDAY: 1) Weigh yourself in the morning before breakfast. Write it down and keep it in a log. 2) Take your medicines as prescribed 3) Eat low salt foods-Limit salt (sodium) to 2000 mg per day.  4) Stay as active as you can everyday 5) Limit all fluids for the day to less than 2 liters 2. Lymphedema: Uses thigh-high compression stockings.   3. CKD: Stage III.  BMET  today.  4. Chest pain: LHC 2017 Nonobstructive.- Continue Imdur.   5. Obesity: Nutrition referral to discuss weight loss.   Discussed Cardiomems. Chest circumference 110 cm. Will need to see she qualifies.   Follow up in 4 weeks.    Garyn Arlotta NP-C  09/22/2016

## 2016-09-30 ENCOUNTER — Ambulatory Visit: Payer: Medicare Other | Admitting: Nutrition

## 2016-10-01 ENCOUNTER — Encounter (HOSPITAL_COMMUNITY): Payer: Self-pay

## 2016-10-01 ENCOUNTER — Ambulatory Visit (HOSPITAL_COMMUNITY)
Admission: RE | Admit: 2016-10-01 | Discharge: 2016-10-01 | Disposition: A | Discharge status: Home / Self Care | Payer: Medicare Other | Source: Ambulatory Visit | Attending: Cardiology | Admitting: Cardiology

## 2016-10-01 VITALS — BP 118/68 | HR 84 | Wt 256.8 lb

## 2016-10-01 DIAGNOSIS — R001 Bradycardia, unspecified: Secondary | ICD-10-CM | POA: Diagnosis not present

## 2016-10-01 DIAGNOSIS — R05 Cough: Secondary | ICD-10-CM | POA: Insufficient documentation

## 2016-10-01 DIAGNOSIS — R0600 Dyspnea, unspecified: Secondary | ICD-10-CM | POA: Diagnosis not present

## 2016-10-01 DIAGNOSIS — R079 Chest pain, unspecified: Secondary | ICD-10-CM | POA: Diagnosis not present

## 2016-10-01 DIAGNOSIS — I890 Lymphedema, not elsewhere classified: Secondary | ICD-10-CM | POA: Diagnosis not present

## 2016-10-01 DIAGNOSIS — Z7982 Long term (current) use of aspirin: Secondary | ICD-10-CM | POA: Diagnosis not present

## 2016-10-01 DIAGNOSIS — I5032 Chronic diastolic (congestive) heart failure: Secondary | ICD-10-CM | POA: Insufficient documentation

## 2016-10-01 DIAGNOSIS — M797 Fibromyalgia: Secondary | ICD-10-CM | POA: Diagnosis not present

## 2016-10-01 DIAGNOSIS — I447 Left bundle-branch block, unspecified: Secondary | ICD-10-CM | POA: Insufficient documentation

## 2016-10-01 DIAGNOSIS — G8929 Other chronic pain: Secondary | ICD-10-CM | POA: Insufficient documentation

## 2016-10-01 DIAGNOSIS — I130 Hypertensive heart and chronic kidney disease with heart failure and stage 1 through stage 4 chronic kidney disease, or unspecified chronic kidney disease: Secondary | ICD-10-CM | POA: Insufficient documentation

## 2016-10-01 DIAGNOSIS — F0390 Unspecified dementia without behavioral disturbance: Secondary | ICD-10-CM | POA: Insufficient documentation

## 2016-10-01 DIAGNOSIS — M549 Dorsalgia, unspecified: Secondary | ICD-10-CM | POA: Insufficient documentation

## 2016-10-01 DIAGNOSIS — N183 Chronic kidney disease, stage 3 unspecified: Secondary | ICD-10-CM

## 2016-10-01 LAB — BASIC METABOLIC PANEL
Anion gap: 16 — ABNORMAL HIGH (ref 5–15)
BUN: 75 mg/dL — AB (ref 6–20)
CO2: 28 mmol/L (ref 22–32)
Calcium: 10.3 mg/dL (ref 8.9–10.3)
Chloride: 92 mmol/L — ABNORMAL LOW (ref 101–111)
Creatinine, Ser: 2.25 mg/dL — ABNORMAL HIGH (ref 0.44–1.00)
GFR calc Af Amer: 24 mL/min — ABNORMAL LOW (ref >60)
GFR calc non Af Amer: 21 mL/min — ABNORMAL LOW (ref >60)
GLUCOSE: 113 mg/dL — AB (ref 65–99)
POTASSIUM: 4.1 mmol/L (ref 3.5–5.1)
Sodium: 136 mmol/L (ref 135–145)

## 2016-10-01 LAB — BRAIN NATRIURETIC PEPTIDE: B Natriuretic Peptide: 24.7 pg/mL (ref 0.0–100.0)

## 2016-10-01 MED ORDER — METOLAZONE 2.5 MG PO TABS: 2.5000 mg | ORAL_TABLET | ORAL | 6 refills | Status: DC

## 2016-10-01 NOTE — Patient Instructions (Signed)
Take Metolazone 2.5 mg every Saturday and Tuesday   Labs today  Chest x-ray today  Labs in 2 weeks  Your physician recommends that you schedule a follow-up appointment in: 1 month

## 2016-10-01 NOTE — Progress Notes (Signed)
Advanced Heart Failure Clinic Note   Primary Care: Dr. Edrick Oh Primary Cardiologist: Dr. Angelena Form Renal: Dr Joelyn Oms HF Cardiology: Dr. Aundra Dubin  HPI: [name] is a 71 y.o. with history of chronic diastolic CHF (Echo 99991111 LVEF 65%, Mild LAE), chronic lymphedema, chronic back pain, HTN, CKD stage 3, and fibromyalgia.    Admitted several times in 02/2016 to [facility] with CHF and chest pain.  Has been tried on spironolactone + metolazone alone as diuretic regimen. Follows with renal as above.   Seen in [facility] clinic 04/16/16. Thought to have mild volume overload. Lasix dose increased to 40 mg daily.  She was admitted in 9/17 with chest pain and AKI.  She had seen nephrology and was given metolazone to take daily along with Lasix, creatinine rose considerably.  Lasix was decreased to 40 mg bid.  When creatinine improved, she had right and left heart cath showing nonobstructive CAD and near-normal filling pressures.  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    She returns for HF follow up. Volume is very difficult to discern given chronic lymphedema.  Weight has gone up some at home.  She has been taking metolazone up to about 3 times a week the last couple of weeks.  Weight is stable on our scale.  She has had episodes of shortness of breath with rest.  She says that her breathing "comes and goes."  No exertional chest pain, has GERD-like symptoms after meals.  She has a cough, no fever.                                                                                                                                                                                   Labs (9/17): K 4.6, creatinine 1.55, BNP 63 Labs (10/17): K 4.7, creatinine 1.8 Labs ( 09/06/2016): K 3.7 Creatinine 2.05  Labs (1/18): K 5.1, creatinine 2.03  ECG: NSR at 58, iLBBB  Past Medical History 1. Chronic diastolic CHF - Echo 99991111 LVEF 65%, Mild LAE - RHC (10/17): mean RA 4, PA  25/11, mean PCWP 5, CI 2.4.  2. CKD stage 3: Follows with nephology. Baseline appears to be creatinine 1.4-1.6.  3. Chronic lymphedema: Has had wraps.  4. HTN 5. Dementia 6. Fibromyalgia 7. Microvascular angina:  - Normal coronaries on Cath 2009 - Stress Myoview 04/09/16 with no ischemia - LHC (10/17) with mild nonobstructive disease.   FH: Mother with CHF, brother with "heart disease."  Another brother with h/o MI.   Current Outpatient Prescriptions  Medication Sig Dispense Refill  . alendronate (FOSAMAX) 70 MG tablet Take 70 mg by mouth every Friday. Take with a full glass of  water on an empty stomach.     Marland Kitchen amLODipine (NORVASC) 5 MG tablet Take 5 mg by mouth Daily.     Marland Kitchen aspirin 81 MG tablet Take 81 mg by mouth at bedtime.    . baclofen (LIORESAL) 10 MG tablet Take 10 mg by mouth 3 (three) times daily.    Marland Kitchen donepezil (ARICEPT) 10 MG tablet Take 10 mg by mouth at bedtime.     Marland Kitchen escitalopram (LEXAPRO) 20 MG tablet Take 30 mg by mouth daily with supper.     . fentaNYL (DURAGESIC - DOSED MCG/HR) 25 MCG/HR patch Place 25 mcg onto the skin every 3 (three) days.     . folic acid (FOLVITE) 1 MG tablet Take 1 mg by mouth 2 (two) times daily.    . furosemide (LASIX) 40 MG tablet Take 2 tablets (80 mg total) by mouth 2 (two) times daily. 120 tablet 3  . isosorbide mononitrate (IMDUR) 30 MG 24 hr tablet Take 30 mg by mouth daily.    Marland Kitchen lamoTRIgine (LAMICTAL) 100 MG tablet Take 100 mg by mouth 2 (two) times daily.     Marland Kitchen levothyroxine (SYNTHROID) 75 MCG tablet Take 75 mcg by mouth daily before breakfast.     . memantine (NAMENDA) 10 MG tablet Take 10 mg by mouth 2 (two) times daily.    . metolazone (ZAROXOLYN) 2.5 MG tablet Take 1 tablet (2.5 mg total) by mouth 2 (two) times a week. Every Tuesday and Saturday 15 tablet 6  . omeprazole (PRILOSEC) 40 MG capsule Take 40 mg by mouth daily.     Marland Kitchen oxyCODONE-acetaminophen (PERCOCET) 7.5-325 MG tablet Take 1 tablet by mouth 3 (three) times daily as needed  for moderate pain.     . pravastatin (PRAVACHOL) 80 MG tablet Take 80 mg by mouth at bedtime.     Marland Kitchen rOPINIRole (REQUIP) 1 MG tablet Take 1 mg by mouth 3 (three) times daily.    . sennosides-docusate sodium (SENOKOT-S) 8.6-50 MG tablet Take 2 tablets by mouth at bedtime.    Marland Kitchen spironolactone (ALDACTONE) 25 MG tablet Take 25 mg by mouth 2 (two) times daily.    Marland Kitchen albuterol (PROVENTIL HFA;VENTOLIN HFA) 108 (90 Base) MCG/ACT inhaler Inhale 1-2 puffs into the lungs every 6 (six) hours as needed for wheezing or shortness of breath. (Patient not taking: Reported on 10/01/2016) 1 Inhaler 0   No current facility-administered medications for this encounter.     Allergies  Allergen Reactions  . Oxycodone Hives, Itching and Other (See Comments)    Other reaction(s): Delusions (intolerance)  . Hydromorphone Other (See Comments)    Hallucinations, altered mental state      Social History   Social History  . Marital status: Widowed    Spouse name: N/A  . Number of children: 3  . Years of education: N/A   Occupational History  . retired    Social History Main Topics  . Smoking status: Never Smoker  . Smokeless tobacco: Never Used  . Alcohol use No  . Drug use: No  . Sexual activity: Not Currently   Other Topics Concern  . Not on file   Social History Narrative   Pt does use caffeine. Lives alone. 3 children. Retired.    Vitals:   10/01/16 1200  BP: 118/68  Pulse: 84  SpO2: 97%  Weight: 256 lb 12 oz (116.5 kg)   Wt Readings from Last 3 Encounters:  10/01/16 256 lb 12 oz (116.5 kg)  09/22/16 256 lb 7 oz (  116.3 kg)  09/06/16 260 lb 4.8 oz (118.1 kg)    PHYSICAL EXAM: General: NAD. Ambulated in the clinic with rolling walker. Daughter rpesent  Neck: No JVD, no thyromegaly or thyroid nodule.  Lungs: Slight crackles right base. CV: Nondisplaced PMI.  Heart regular S1/S2, no S3/S4, no murmur.  No carotid bruit.  Normal pedal pulses.  Abdomen: Soft, nontender, no hepatosplenomegaly,  no distention.  Skin: Intact without lesions or rashes. Warm Neurologic: Alert and oriented x 3.  Psych: Normal affect. Extremities: No clubbing or cyanosis. Bilateral lymphedema.  HEENT: Normal.   ASSESSMENT & PLAN: 1. Chronic diastolic CHF:  NYHA class III symptoms.  Exam is made very difficult by her chronic lymphedema.  She does not seem markedly volume overloaded on exam but has been taking metolazone three times weekly for the last few weeks. - Continue lasix 80 mg twice a day and I will have her take metolazone 2.5 mg twice a week on Tuesdays and Saturdays. - Check ECHO.  - Exam difficult for volume, I think she would be a good Cardiomems candidate.   - CXR to rule out PNA as cause of cough and lung crackles.  2. Lymphedema: Uses thigh-high compression stockings.   3. CKD: Stage III.   4. Chest pain: LHC 2017 Nonobstructive.  Continue Imdur.    Discussed Cardiomems. Chest circumference 110 cm. She should qualify.    Loralie Champagne 10/01/2016

## 2016-10-05 ENCOUNTER — Ambulatory Visit: Payer: Medicare Other | Attending: Orthopedic Surgery | Admitting: Physical Therapy

## 2016-10-05 DIAGNOSIS — M25562 Pain in left knee: Secondary | ICD-10-CM | POA: Diagnosis not present

## 2016-10-05 DIAGNOSIS — M6281 Muscle weakness (generalized): Secondary | ICD-10-CM | POA: Diagnosis present

## 2016-10-05 DIAGNOSIS — G8929 Other chronic pain: Secondary | ICD-10-CM

## 2016-10-05 DIAGNOSIS — R262 Difficulty in walking, not elsewhere classified: Secondary | ICD-10-CM | POA: Insufficient documentation

## 2016-10-05 NOTE — Addendum Note (Signed)
Addended by: APPLEGATE, Mal[place] W on: 10/05/2016 01:44 PM   Modules accepted: Orders

## 2016-10-05 NOTE — Therapy (Signed)
[facility] [place], [place], [address] Phone: [phone]   Fax:  [phone]  Physical Therapy Evaluation  Patient Details  Name: [name] MRN: [id number] Date of Birth: [birth year] Referring Provider: Earlie Server MD.  Encounter Date: 10/05/2016      PT End of Session - 10/05/16 1030    Visit Number 1   Number of Visits 16   Date for PT Re-Evaluation 12/04/16   PT Start Time 0905   PT Stop Time 0951   PT Time Calculation (min) 46 min   Activity Tolerance Patient tolerated treatment well   Behavior During Therapy [facility] for tasks assessed/performed      Past Medical History:  Diagnosis Date  . Anemia   . Anxiety   . Arthritis   . Brain lesion    2 types  . Cervical spondylosis with myelopathy   . Chest pain    Normal cardiac cath 5/09  . Congestive heart failure (CHF) ([place])   . Depression   . Dumping syndrome   . Edema   . Fatigue   . Fibromyalgia   . History of echocardiogram    a. Echo 03/20/16 (done at [facility] in [place], [place]):  mild LVH, EF 96%, normal diastolic function, mild LAE, MAC, RVSP 25 mmHg  . Hyperlipidemia   . Hypertension   . Hypothyroidism   . Lumbar spondylosis   . Lymphedema    seeing specialist for this  . Restless leg syndrome   . Rotator cuff injury   . Stroke [facility])    TIAs "mini strokes"- unsure of last TIA  . Syncope   . Tremors of nervous system     Past Surgical History:  Procedure Laterality Date  . APPENDECTOMY  1966  . CARDIAC CATHETERIZATION N/A 05/25/2016   Procedure: Right/Left Heart Cath and Coronary Angiography;  Surgeon: Larey Dresser, MD;  Location: [place] [facility];  Service: Cardiovascular;  Laterality: N/A;  . CHOLECYSTECTOMY  2015  . CHOLECYSTECTOMY  2015  . CORONARY ANGIOGRAM  2009   Normal coronaries  . Hip Bursa Surgery Bilateral   . KNEE ARTHROSCOPY Bilateral   . LUMBAR DISC SURGERY     x2  . LUMBAR FUSION    . ROTATOR CUFF REPAIR  Bilateral   . TOTAL ABDOMINAL HYSTERECTOMY    . TOTAL KNEE ARTHROPLASTY Left   . TOTAL KNEE ARTHROPLASTY Right 11/18/2012   Procedure: TOTAL KNEE ARTHROPLASTY;  Surgeon: Yvette Rack., MD;  Location: [place];  Service: Orthopedics;  Laterality: Right;    There were no vitals filed for this visit.       Subjective Assessment - 10/05/16 1033    Pertinent History Bilateral total knee replacements.            [facility] PT Assessment - 10/05/16 0001      Assessment   Medical Diagnosis Left knee pain.   Referring Provider Earlie Server MD.   Onset Date/Surgical Date --  Ongoing.     Precautions   Precautions --  No ultrasound.     Restrictions   Weight Bearing Restrictions No     Balance Screen   Has the patient fallen in the past 6 months No   Has the patient had a decrease in activity level because of a fear of falling?  No   Is the patient reluctant to leave their home because of a fear of falling?  No     Home Environment  Living Environment Private residence     Prior Function   Level of Independence Independent     Observation/Other Assessments-Edema    Edema --  LE lymphedema.     ROM / Strength   AROM / PROM / Strength AROM;Strength     AROM   Overall AROM Comments Full left knee extension and flexion to 110 degrees.     Strength   Overall Strength Comments Left hip strength= 4-/5; left knee strength= 4/5.     Palpation   Palpation comment Very tender to palpation over patient's left Pes Anserine.  Palpable pain also in the area of the left popliteal fossa.     Ambulation/Gait   Gait Comments Safe ambulation with a rollator walker.                   [facility] Adult PT Treatment/Exercise - 10/05/16 0001      Modalities   Modalities Electrical Stimulation;Moist Heat     Moist Heat Therapy   Number Minutes Moist Heat 15 Minutes   Moist Heat Location --  Left Pes Anserine.     Acupuncturist Stimulation Location Left  Pes Anserine.   Electrical Stimulation Action Constant Pre-mod.   Electrical Stimulation Parameters 80-150 Hz.   Electrical Stimulation Goals Pain                  PT Short Term Goals - 10/05/16 1037      PT SHORT TERM GOAL #1   Title STG's=LTG's.           PT Long Term Goals - 10/05/16 1038      PT LONG TERM GOAL #1   Title Independent with a HEP.   Time 8   Period Weeks   Status New     PT LONG TERM GOAL #2   Title Stand 20 minutes with left knee pain not > 3/10.   Time 8   Period Weeks   Status New     PT LONG TERM GOAL #3   Title Perform ADL's with pain not > 3/10.   Time 8   Period Weeks   Status New               Plan - 10/05/16 1032    Clinical Impression Statement The patient is very tender to palpation over her left Pes Anserine and also in the region of her popliteal fossa.  She has strength losses in her left hip and knee.  She walks safely with a rollator.   Rehab Potential Good   PT Frequency 2x / week   PT Duration 8 weeks   PT Treatment/Interventions Iontophoresis 55m/ml Dexamethasone;Moist Heat;Patient/family education;Therapeutic exercise;Therapeutic activities;Manual techniques;Vasopneumatic Device;Dry needling   PT Next Visit Plan Sending order for Iontophoreis (please check under "other orders" tab; STW/M; electrical stimulation.  Low-level left LE strengthening exercises.      Patient will benefit from skilled therapeutic intervention in order to improve the following deficits and impairments:  Decreased activity tolerance, Decreased strength, Pain  Visit Diagnosis: Chronic pain of left knee - Plan: PT plan of care cert/re-cert  Muscle weakness (generalized) - Plan: PT plan of care cert/re-cert      G-Codes - [id number]    Functional Assessment Tool Used FOTO..Marland KitchenMarland KitchenMarland Kitchen8% limitation.   Functional Limitation Mobility: Walking and moving around   Mobility: Walking and Moving Around Current Status [phone] At least 60 percent  but less than 80 percent impaired, limited or restricted  Mobility: Walking and Moving Around Goal Status [phone]) At least 20 percent but less than 40 percent impaired, limited or restricted       Problem List Patient Active Problem List   Diagnosis Date Noted  . Unstable angina ([place]) 05/21/2016  . Chronic diastolic CHF (congestive heart failure) ([place]) 03/29/2016  . Normal coronary arteries 2009 01/14/2015  . Obesity-BMI 40 01/14/2015  . Restless leg 01/14/2015  . Chest pain 01/14/2015  . Back pain 01/14/2015  . Renal insufficiency 01/14/2015  . Dementia- mild memory issues 01/14/2015  . Occult blood positive stool 12/14/2014  . Anemia 12/14/2014  . Family history of colon cancer 12/14/2014  . Change in bowel habits 12/14/2014  . GERD (gastroesophageal reflux disease) 12/14/2014  . Dyspnea 05/17/2012  . Lymphedema 05/17/2012  . Weight gain 05/17/2012  . HTN (hypertension) 05/17/2012    Whitney Bingaman, [place] MPT 10/05/2016, 10:45 AM  [facility] [place], [place], [address] Phone: [phone]   Fax:  [phone]  Name: [name] MRN: [id number] Date of Birth: Mar 31, 1946

## 2016-10-07 ENCOUNTER — Encounter: Payer: Self-pay | Admitting: Physical Therapy

## 2016-10-07 ENCOUNTER — Ambulatory Visit: Payer: Medicare Other | Admitting: Physical Therapy

## 2016-10-07 DIAGNOSIS — M25562 Pain in left knee: Principal | ICD-10-CM

## 2016-10-07 DIAGNOSIS — G8929 Other chronic pain: Secondary | ICD-10-CM

## 2016-10-07 DIAGNOSIS — M6281 Muscle weakness (generalized): Secondary | ICD-10-CM

## 2016-10-07 NOTE — Therapy (Signed)
[facility] [place], [place], [address] Phone: [phone]   Fax:  [phone]  Physical Therapy Treatment  Patient Details  Name: [name] MRN: [id number] Date of Birth: 1945/09/14 Referring Provider: Earlie Server MD.  Encounter Date: 10/07/2016      PT End of Session - 10/07/16 1023    Visit Number 2   Number of Visits 16   Date for PT Re-Evaluation 12/04/16   PT Start Time 0945   PT Stop Time 1035   PT Time Calculation (min) 50 min   Activity Tolerance Patient tolerated treatment well   Behavior During Therapy [facility] for tasks assessed/performed      Past Medical History:  Diagnosis Date  . Anemia   . Anxiety   . Arthritis   . Brain lesion    2 types  . Cervical spondylosis with myelopathy   . Chest pain    Normal cardiac cath 5/09  . Congestive heart failure (CHF) ([place])   . Depression   . Dumping syndrome   . Edema   . Fatigue   . Fibromyalgia   . History of echocardiogram    a. Echo 03/20/16 (done at [facility] in [place], [place]):  mild LVH, EF 41%, normal diastolic function, mild LAE, MAC, RVSP 25 mmHg  . Hyperlipidemia   . Hypertension   . Hypothyroidism   . Lumbar spondylosis   . Lymphedema    seeing specialist for this  . Restless leg syndrome   . Rotator cuff injury   . Stroke [facility])    TIAs "mini strokes"- unsure of last TIA  . Syncope   . Tremors of nervous system     Past Surgical History:  Procedure Laterality Date  . APPENDECTOMY  1966  . CARDIAC CATHETERIZATION N/A 05/25/2016   Procedure: Right/Left Heart Cath and Coronary Angiography;  Surgeon: Larey Dresser, MD;  Location: [place] [facility];  Service: Cardiovascular;  Laterality: N/A;  . CHOLECYSTECTOMY  2015  . CHOLECYSTECTOMY  2015  . CORONARY ANGIOGRAM  2009   Normal coronaries  . Hip Bursa Surgery Bilateral   . KNEE ARTHROSCOPY Bilateral   . LUMBAR DISC SURGERY     x2  . LUMBAR FUSION    . ROTATOR CUFF REPAIR  Bilateral   . TOTAL ABDOMINAL HYSTERECTOMY    . TOTAL KNEE ARTHROPLASTY Left   . TOTAL KNEE ARTHROPLASTY Right 11/18/2012   Procedure: TOTAL KNEE ARTHROPLASTY;  Surgeon: Yvette Rack., MD;  Location: [place];  Service: Orthopedics;  Laterality: Right;    There were no vitals filed for this visit.      Subjective Assessment - 10/07/16 0948    Subjective Pt states her knee feels "ok, the estim helped a lot"   Currently in Pain? Yes   Pain Score 2    Pain Location Knee   Pain Orientation Left   Pain Descriptors / Indicators Aching   Pain Type Chronic pain   Aggravating Factors  standing, walking   Pain Relieving Factors tens                         [facility] Adult PT Treatment/Exercise - 10/07/16 0001      Exercises   Exercises Knee/Hip     Knee/Hip Exercises: Aerobic   Nustep 6 minutes level 3     Knee/Hip Exercises: Seated   Long Arc Quad Strengthening;Both;20 reps  with 3 second hold   Hamstring Curl  Strengthening;Both;20 reps  red t band   Abduction/Adduction  Strengthening;Both;20 reps   Abd/Adduction Weights --  pillow squeeze and manual resistance for abd     Moist Heat Therapy   Number Minutes Moist Heat 15 Minutes     Electrical Stimulation   Electrical Stimulation Location Lt pes anserine   Electrical Stimulation Action premod   Electrical Stimulation Parameters to tolerance   Electrical Stimulation Goals Pain     Manual Therapy   Manual Therapy Soft tissue mobilization   Soft tissue mobilization Lt pes anserine and hamstrings                  PT Short Term Goals - 10/05/16 1037      PT SHORT TERM GOAL #1   Title STG's=LTG's.           PT Long Term Goals - 10/07/16 1024      PT LONG TERM GOAL #1   Title Independent with a HEP.   Status On-going     PT LONG TERM GOAL #2   Title Stand 20 minutes with left knee pain not > 3/10.   Status On-going     PT LONG TERM GOAL #3   Title Perform ADL's with pain not > 3/10.    Status On-going     PT LONG TERM GOAL #4   Title Pt to be walking 10 minutes twice a day to promote lymphatic circulation    Status On-going     PT LONG TERM GOAL #5   Title Pt to state she is able to walk for 10 minutes without difficulty ( pt limited to 10 minutes due to back issues)   Status On-going               Plan - 10/07/16 1023    Clinical Impression Statement Pt still with tenderness in pes anserine and hamstring insertion. pt able to tolerate manual therapy, some increased back pain with therex   Rehab Potential Good   PT Frequency 2x / week   PT Duration 8 weeks   PT Treatment/Interventions Iontophoresis 11m/ml Dexamethasone;Moist Heat;Patient/family education;Therapeutic exercise;Therapeutic activities;Manual techniques;Vasopneumatic Device;Dry needling   PT Next Visit Plan ionto, manual, progress strengthening      Patient will benefit from skilled therapeutic intervention in order to improve the following deficits and impairments:  Decreased activity tolerance, Decreased strength, Pain  Visit Diagnosis: Chronic pain of left knee  Muscle weakness (generalized)     Problem List Patient Active Problem List   Diagnosis Date Noted  . Unstable angina ([place] 05/21/2016  . Chronic diastolic CHF (congestive heart failure) ([place] 03/29/2016  . Normal coronary arteries 2009 01/14/2015  . Obesity-BMI 40 01/14/2015  . Restless leg 01/14/2015  . Chest pain 01/14/2015  . Back pain 01/14/2015  . Renal insufficiency 01/14/2015  . Dementia- mild memory issues 01/14/2015  . Occult blood positive stool 12/14/2014  . Anemia 12/14/2014  . Family history of colon cancer 12/14/2014  . Change in bowel habits 12/14/2014  . GERD (gastroesophageal reflux disease) 12/14/2014  . Dyspnea 05/17/2012  . Lymphedema 05/17/2012  . Weight gain 05/17/2012  . HTN (hypertension) 05/17/2012    KIsabelle Course Pt, DPT 10/07/2016, 10:27 AM  [facility] [address]Phone: [phone]  Fax:  [phone] Name: CKIMBERLYANN HOLLARMRN: [id number] of Birth: [birth year]

## 2016-10-12 ENCOUNTER — Ambulatory Visit: Payer: Medicare Other | Admitting: Physical Therapy

## 2016-10-12 DIAGNOSIS — M6281 Muscle weakness (generalized): Secondary | ICD-10-CM

## 2016-10-12 DIAGNOSIS — M25562 Pain in left knee: Secondary | ICD-10-CM | POA: Diagnosis not present

## 2016-10-12 DIAGNOSIS — G8929 Other chronic pain: Secondary | ICD-10-CM

## 2016-10-12 NOTE — Therapy (Signed)
[facility] [place], [place], [address] Phone: [phone]   Fax:  [phone]  Physical Therapy Treatment  Patient Details  Name: [name] MRN: [id number] Date of Birth: 22-Oct-1945 Referring Provider: Earlie Server MD.  Encounter Date: 10/12/2016      PT End of Session - 10/12/16 1743    Visit Number 3   Number of Visits 16   Date for PT Re-Evaluation 12/04/16   PT Start Time 0154   PT Stop Time 0239   PT Time Calculation (min) 45 min   Activity Tolerance Patient tolerated treatment well   Behavior During Therapy [facility] for tasks assessed/performed      Past Medical History:  Diagnosis Date  . Anemia   . Anxiety   . Arthritis   . Brain lesion    2 types  . Cervical spondylosis with myelopathy   . Chest pain    Normal cardiac cath 5/09  . Congestive heart failure (CHF) ([place])   . Depression   . Dumping syndrome   . Edema   . Fatigue   . Fibromyalgia   . History of echocardiogram    a. Echo 03/20/16 (done at [facility] in [place], [place]):  mild LVH, EF 41%, normal diastolic function, mild LAE, MAC, RVSP 25 mmHg  . Hyperlipidemia   . Hypertension   . Hypothyroidism   . Lumbar spondylosis   . Lymphedema    seeing specialist for this  . Restless leg syndrome   . Rotator cuff injury   . Stroke [facility])    TIAs "mini strokes"- unsure of last TIA  . Syncope   . Tremors of nervous system     Past Surgical History:  Procedure Laterality Date  . APPENDECTOMY  1966  . CARDIAC CATHETERIZATION N/A 05/25/2016   Procedure: Right/Left Heart Cath and Coronary Angiography;  Surgeon: Larey Dresser, MD;  Location: [place] [facility];  Service: Cardiovascular;  Laterality: N/A;  . CHOLECYSTECTOMY  2015  . CHOLECYSTECTOMY  2015  . CORONARY ANGIOGRAM  2009   Normal coronaries  . Hip Bursa Surgery Bilateral   . KNEE ARTHROSCOPY Bilateral   . LUMBAR DISC SURGERY     x2  . LUMBAR FUSION    . ROTATOR CUFF REPAIR  Bilateral   . TOTAL ABDOMINAL HYSTERECTOMY    . TOTAL KNEE ARTHROPLASTY Left   . TOTAL KNEE ARTHROPLASTY Right 11/18/2012   Procedure: TOTAL KNEE ARTHROPLASTY;  Surgeon: Yvette Rack., MD;  Location: [place];  Service: Orthopedics;  Laterality: Right;    There were no vitals filed for this visit.      Subjective Assessment - 10/12/16 1747    Subjective That last treatment helped.   Pain Score 4    Pain Location Knee   Pain Orientation Left   Pain Descriptors / Indicators Aching   Pain Type Chronic pain   Pain Frequency Constant                         [facility] Adult PT Treatment/Exercise - 10/12/16 0001      Exercises   Exercises Knee/Hip     Knee/Hip Exercises: Aerobic   Nustep 8 minutes at level 3.     Acupuncturist Location --  LT PES ANSERINE.   Electrical Stimulation Action Constant pre-mod   Electrical Stimulation Parameters 80-150 HZ.   Electrical Stimulation Goals Pain     Manual Therapy  Manual Therapy Soft tissue mobilization   Soft tissue mobilization Left Pes Anserine and medial hamstrings which were very tender today x 15 minutes.                  PT Short Term Goals - 10/05/16 1037      PT SHORT TERM GOAL #1   Title STG's=LTG's.           PT Long Term Goals - 10/07/16 1024      PT LONG TERM GOAL #1   Title Independent with a HEP.   Status On-going     PT LONG TERM GOAL #2   Title Stand 20 minutes with left knee pain not > 3/10.   Status On-going     PT LONG TERM GOAL #3   Title Perform ADL's with pain not > 3/10.   Status On-going     PT LONG TERM GOAL #4   Title Pt to be walking 10 minutes twice a day to promote lymphatic circulation    Status On-going     PT LONG TERM GOAL #5   Title Pt to state she is able to walk for 10 minutes without difficulty ( pt limited to 10 minutes due to back issues)   Status On-going             Patient will benefit from skilled  therapeutic intervention in order to improve the following deficits and impairments:  Decreased activity tolerance, Decreased strength, Pain  Visit Diagnosis: Chronic pain of left knee  Muscle weakness (generalized)     Problem List Patient Active Problem List   Diagnosis Date Noted  . Unstable angina ([place]) 05/21/2016  . Chronic diastolic CHF (congestive heart failure) ([place]) 03/29/2016  . Normal coronary arteries 2009 01/14/2015  . Obesity-BMI 40 01/14/2015  . Restless leg 01/14/2015  . Chest pain 01/14/2015  . Back pain 01/14/2015  . Renal insufficiency 01/14/2015  . Dementia- mild memory issues 01/14/2015  . Occult blood positive stool 12/14/2014  . Anemia 12/14/2014  . Family history of colon cancer 12/14/2014  . Change in bowel habits 12/14/2014  . GERD (gastroesophageal reflux disease) 12/14/2014  . Dyspnea 05/17/2012  . Lymphedema 05/17/2012  . Weight gain 05/17/2012  . HTN (hypertension) 05/17/2012    Cade Olberding, [place] MPT 10/12/2016, 5:50 PM  [facility] [address] [place], [place], [address] Phone: [phone]   Fax:  [phone]  Name: [name] MRN: [id number] Date of Birth: 08-28-1945

## 2016-10-13 ENCOUNTER — Ambulatory Visit (HOSPITAL_COMMUNITY)
Admission: RE | Admit: 2016-10-13 | Discharge: 2016-10-13 | Disposition: A | Discharge status: Home / Self Care | Payer: Medicare Other | Source: Ambulatory Visit | Attending: Family Medicine | Admitting: Family Medicine

## 2016-10-13 ENCOUNTER — Ambulatory Visit: Payer: Medicare Other

## 2016-10-13 DIAGNOSIS — I5032 Chronic diastolic (congestive) heart failure: Secondary | ICD-10-CM | POA: Diagnosis present

## 2016-10-13 NOTE — Progress Notes (Signed)
  Echocardiogram 2D Echocardiogram has been performed.  Darlina Sicilian M 10/13/2016, 12:00 PM

## 2016-10-15 ENCOUNTER — Ambulatory Visit: Payer: Medicare Other | Admitting: Physical Therapy

## 2016-10-15 DIAGNOSIS — M25562 Pain in left knee: Secondary | ICD-10-CM | POA: Diagnosis not present

## 2016-10-15 DIAGNOSIS — G8929 Other chronic pain: Secondary | ICD-10-CM

## 2016-10-15 DIAGNOSIS — M6281 Muscle weakness (generalized): Secondary | ICD-10-CM

## 2016-10-15 NOTE — Therapy (Signed)
[facility] [place], [place], [address] Phone: [phone]   Fax:  [phone]  Physical Therapy Treatment  Patient Details  Name: [name] MRN: [id number] Date of Birth: 01-11-1946 Referring Provider: Earlie Server MD.  Encounter Date: 10/15/2016      PT End of Session - 10/15/16 1110    Visit Number 4   Number of Visits 16   Date for PT Re-Evaluation 12/04/16   PT Start Time 0945   PT Stop Time 1032   PT Time Calculation (min) 47 min   Activity Tolerance Patient tolerated treatment well   Behavior During Therapy [facility] for tasks assessed/performed      Past Medical History:  Diagnosis Date  . Anemia   . Anxiety   . Arthritis   . Brain lesion    2 types  . Cervical spondylosis with myelopathy   . Chest pain    Normal cardiac cath 5/09  . Congestive heart failure (CHF) ([place])   . Depression   . Dumping syndrome   . Edema   . Fatigue   . Fibromyalgia   . History of echocardiogram    a. Echo 03/20/16 (done at [facility] in [place], [place]):  mild LVH, EF 19%, normal diastolic function, mild LAE, MAC, RVSP 25 mmHg  . Hyperlipidemia   . Hypertension   . Hypothyroidism   . Lumbar spondylosis   . Lymphedema    seeing specialist for this  . Restless leg syndrome   . Rotator cuff injury   . Stroke [facility])    TIAs "mini strokes"- unsure of last TIA  . Syncope   . Tremors of nervous system     Past Surgical History:  Procedure Laterality Date  . APPENDECTOMY  1966  . CARDIAC CATHETERIZATION N/A 05/25/2016   Procedure: Right/Left Heart Cath and Coronary Angiography;  Surgeon: Larey Dresser, MD;  Location: [place] [facility];  Service: Cardiovascular;  Laterality: N/A;  . CHOLECYSTECTOMY  2015  . CHOLECYSTECTOMY  2015  . CORONARY ANGIOGRAM  2009   Normal coronaries  . Hip Bursa Surgery Bilateral   . KNEE ARTHROSCOPY Bilateral   . LUMBAR DISC SURGERY     x2  . LUMBAR FUSION    . ROTATOR CUFF REPAIR  Bilateral   . TOTAL ABDOMINAL HYSTERECTOMY    . TOTAL KNEE ARTHROPLASTY Left   . TOTAL KNEE ARTHROPLASTY Right 11/18/2012   Procedure: TOTAL KNEE ARTHROPLASTY;  Surgeon: Yvette Rack., MD;  Location: [place];  Service: Orthopedics;  Laterality: Right;    There were no vitals filed for this visit.      Subjective Assessment - 10/15/16 1030    Subjective The side of my knee is feeling better.  Feel pain in back of knee more today.   Pain Score 4    Pain Location Knee   Pain Orientation Left   Pain Type Chronic pain   Pain Frequency Constant      Treatment:  Nustep level 4 x 21 minutes f/b red theraband resisted left knee flexion while seated x 2 minutes f/b HMP and e'stim x 15 minutes to patient's affected left knee.                               PT Short Term Goals - 10/05/16 1037      PT SHORT TERM GOAL #1   Title STG's=LTG's.  PT Long Term Goals - 10/07/16 1024      PT LONG TERM GOAL #1   Title Independent with a HEP.   Status On-going     PT LONG TERM GOAL #2   Title Stand 20 minutes with left knee pain not > 3/10.   Status On-going     PT LONG TERM GOAL #3   Title Perform ADL's with pain not > 3/10.   Status On-going     PT LONG TERM GOAL #4   Title Pt to be walking 10 minutes twice a day to promote lymphatic circulation    Status On-going     PT LONG TERM GOAL #5   Title Pt to state she is able to walk for 10 minutes without difficulty ( pt limited to 10 minutes due to back issues)   Status On-going             Patient will benefit from skilled therapeutic intervention in order to improve the following deficits and impairments:  Decreased activity tolerance, Decreased strength, Pain  Visit Diagnosis: Chronic pain of left knee  Muscle weakness (generalized)     Problem List Patient Active Problem List   Diagnosis Date Noted  . Unstable angina ([place]) 05/21/2016  . Chronic diastolic CHF (congestive heart  failure) ([place]) 03/29/2016  . Normal coronary arteries 2009 01/14/2015  . Obesity-BMI 40 01/14/2015  . Restless leg 01/14/2015  . Chest pain 01/14/2015  . Back pain 01/14/2015  . Renal insufficiency 01/14/2015  . Dementia- mild memory issues 01/14/2015  . Occult blood positive stool 12/14/2014  . Anemia 12/14/2014  . Family history of colon cancer 12/14/2014  . Change in bowel habits 12/14/2014  . GERD (gastroesophageal reflux disease) 12/14/2014  . Dyspnea 05/17/2012  . Lymphedema 05/17/2012  . Weight gain 05/17/2012  . HTN (hypertension) 05/17/2012    Lanny Donoso, [place] MPT 10/15/2016, 11:12 AM  [facility] [place], [place], [address] Phone: [phone]   Fax:  [phone]  Name: [name] MRN: [id number] Date of Birth: 1945-08-27

## 2016-10-20 ENCOUNTER — Ambulatory Visit (HOSPITAL_COMMUNITY)
Admission: RE | Admit: 2016-10-20 | Discharge: 2016-10-20 | Disposition: A | Discharge status: Home / Self Care | Payer: Medicare Other | Source: Ambulatory Visit | Attending: Cardiology | Admitting: Cardiology

## 2016-10-20 VITALS — BP 152/68 | HR 92 | Wt 262.2 lb

## 2016-10-20 DIAGNOSIS — I5032 Chronic diastolic (congestive) heart failure: Secondary | ICD-10-CM | POA: Diagnosis present

## 2016-10-20 DIAGNOSIS — G8929 Other chronic pain: Secondary | ICD-10-CM | POA: Insufficient documentation

## 2016-10-20 DIAGNOSIS — M797 Fibromyalgia: Secondary | ICD-10-CM | POA: Insufficient documentation

## 2016-10-20 DIAGNOSIS — Z7982 Long term (current) use of aspirin: Secondary | ICD-10-CM | POA: Insufficient documentation

## 2016-10-20 DIAGNOSIS — I130 Hypertensive heart and chronic kidney disease with heart failure and stage 1 through stage 4 chronic kidney disease, or unspecified chronic kidney disease: Secondary | ICD-10-CM | POA: Insufficient documentation

## 2016-10-20 DIAGNOSIS — R079 Chest pain, unspecified: Secondary | ICD-10-CM | POA: Insufficient documentation

## 2016-10-20 DIAGNOSIS — M549 Dorsalgia, unspecified: Secondary | ICD-10-CM | POA: Insufficient documentation

## 2016-10-20 DIAGNOSIS — I10 Essential (primary) hypertension: Secondary | ICD-10-CM | POA: Diagnosis not present

## 2016-10-20 DIAGNOSIS — F0390 Unspecified dementia without behavioral disturbance: Secondary | ICD-10-CM | POA: Diagnosis not present

## 2016-10-20 DIAGNOSIS — Z9989 Dependence on other enabling machines and devices: Secondary | ICD-10-CM

## 2016-10-20 DIAGNOSIS — G4733 Obstructive sleep apnea (adult) (pediatric): Secondary | ICD-10-CM | POA: Insufficient documentation

## 2016-10-20 DIAGNOSIS — I2510 Atherosclerotic heart disease of native coronary artery without angina pectoris: Secondary | ICD-10-CM | POA: Diagnosis not present

## 2016-10-20 DIAGNOSIS — I890 Lymphedema, not elsewhere classified: Secondary | ICD-10-CM | POA: Insufficient documentation

## 2016-10-20 DIAGNOSIS — N183 Chronic kidney disease, stage 3 unspecified: Secondary | ICD-10-CM

## 2016-10-20 MED ORDER — AMLODIPINE BESYLATE 10 MG PO TABS
10.0000 mg | ORAL_TABLET | Freq: Every day | ORAL | 6 refills | Status: DC
Start: 2016-10-20 — End: 2019-10-30

## 2016-10-20 NOTE — Progress Notes (Signed)
Advanced Heart Failure Clinic Note   Primary Care: Dr. Edrick Oh Primary Cardiologist: Dr. Angelena Form Renal: Dr Joelyn Oms HF Cardiology: Dr. Aundra Dubin  HPI: [name] is a 71 y.o. with history of chronic diastolic CHF (Echo 99991111 LVEF 65%, Mild LAE), chronic lymphedema, chronic back pain, HTN, CKD stage 3, and fibromyalgia.    Admitted several times in 02/2016 to [facility] with CHF and chest pain.  Has been tried on spironolactone + metolazone alone as diuretic regimen. Follows with renal as above.   Seen in [facility] clinic 04/16/16. Thought to have mild volume overload. Lasix dose increased to 40 mg daily.  She was admitted in 9/17 with chest pain and AKI.  She had seen nephrology and was given metolazone to take daily along with Lasix, creatinine rose considerably.  Lasix was decreased to 40 mg bid.  When creatinine improved, she had right and left heart cath showing nonobstructive CAD and near-normal filling pressures.  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    Today she returns for HF follow up with her daughter. Last visit metolazone was increased to twice a week. Complaining of headache a couple times a week. Says it goes away on its on. Says its not as bad as a migraine. In the past she devloped headaches after she took imdur. Denies SOB. + PND. Sleeps with HOB elevated. Weight at home 260 pounds. Appetite ok.  Says she is following low salt diet. Limiting fluid intake to < 2 liters per day. Taking all medications.                                                                                                                                                                      Labs (9/17): K 4.6, creatinine 1.55, BNP 63 Labs (10/17): K 4.7, creatinine 1.8 Labs ( 09/06/2016): K 3.7 Creatinine 2.05  Labs (1/18): K 5.1, creatinine 2.03 Labs 10/01/2016: K 4.1 Creatinine 2.25 Labs 10/19/2016: K 4.3 Creatinine 1.87   ECG: NSR at 58, iLBBB  Past Medical  History 1. Chronic diastolic CHF - Echo 99991111 LVEF 65%, Mild LAE - [facility] (10/17): mean RA 4, PA 25/11, mean PCWP 5, CI 2.4.  2. CKD stage 3: Follows with nephology. Baseline appears to be creatinine 1.4-1.6.  3. Chronic lymphedema: Has had wraps.  4. HTN 5. Dementia 6. Fibromyalgia 7. Microvascular angina:  - Normal coronaries on Cath 2009 - Stress Myoview 04/09/16 with no ischemia - LHC (10/17) with mild nonobstructive disease.  8. ECHO 10/13/2016: EF 55-60% Grade IDD  FH: Mother with CHF, brother with "heart disease."  Another brother with h/o MI.   Current Outpatient Prescriptions  Medication Sig Dispense Refill  . albuterol (PROVENTIL HFA;VENTOLIN HFA) 108 (90 Base) MCG/ACT inhaler  Inhale 1-2 puffs into the lungs every 6 (six) hours as needed for wheezing or shortness of breath. (Patient not taking: Reported on 10/01/2016) 1 Inhaler 0  . alendronate (FOSAMAX) 70 MG tablet Take 70 mg by mouth every Friday. Take with a full glass of water on an empty stomach.     Marland Kitchen amLODipine (NORVASC) 5 MG tablet Take 5 mg by mouth Daily.     Marland Kitchen aspirin 81 MG tablet Take 81 mg by mouth at bedtime.    . baclofen (LIORESAL) 10 MG tablet Take 10 mg by mouth 3 (three) times daily.    Marland Kitchen donepezil (ARICEPT) 10 MG tablet Take 10 mg by mouth at bedtime.     Marland Kitchen escitalopram (LEXAPRO) 20 MG tablet Take 30 mg by mouth daily with supper.     . fentaNYL (DURAGESIC - DOSED MCG/HR) 25 MCG/HR patch Place 25 mcg onto the skin every 3 (three) days.     . folic acid (FOLVITE) 1 MG tablet Take 1 mg by mouth 2 (two) times daily.    . furosemide (LASIX) 40 MG tablet Take 2 tablets (80 mg total) by mouth 2 (two) times daily. 120 tablet 3  . isosorbide mononitrate (IMDUR) 30 MG 24 hr tablet Take 30 mg by mouth daily.    Marland Kitchen lamoTRIgine (LAMICTAL) 100 MG tablet Take 100 mg by mouth 2 (two) times daily.     Marland Kitchen levothyroxine (SYNTHROID) 75 MCG tablet Take 75 mcg by mouth daily before breakfast.     . memantine (NAMENDA) 10 MG tablet  Take 10 mg by mouth 2 (two) times daily.    . metolazone (ZAROXOLYN) 2.5 MG tablet Take 1 tablet (2.5 mg total) by mouth 2 (two) times a week. Every Tuesday and Saturday 15 tablet 6  . omeprazole (PRILOSEC) 40 MG capsule Take 40 mg by mouth daily.     Marland Kitchen oxyCODONE-acetaminophen (PERCOCET) 7.5-325 MG tablet Take 1 tablet by mouth 3 (three) times daily as needed for moderate pain.     . pravastatin (PRAVACHOL) 80 MG tablet Take 80 mg by mouth at bedtime.     Marland Kitchen rOPINIRole (REQUIP) 1 MG tablet Take 1 mg by mouth 3 (three) times daily.    . sennosides-docusate sodium (SENOKOT-S) 8.6-50 MG tablet Take 2 tablets by mouth at bedtime.    Marland Kitchen spironolactone (ALDACTONE) 25 MG tablet Take 25 mg by mouth 2 (two) times daily.     No current facility-administered medications for this encounter.     Allergies  Allergen Reactions  . Oxycodone Hives, Itching and Other (See Comments)    Other reaction(s): Delusions (intolerance)  . Hydromorphone Other (See Comments)    Hallucinations, altered mental state      Social History   Social History  . Marital status: Widowed    Spouse name: N/A  . Number of children: 3  . Years of education: N/A   Occupational History  . retired    Social History Main Topics  . Smoking status: Never Smoker  . Smokeless tobacco: Never Used  . Alcohol use No  . Drug use: No  . Sexual activity: Not Currently   Other Topics Concern  . Not on file   Social History Narrative   Pt does use caffeine. Lives alone. 3 children. Retired.    Vitals:   10/20/16 1412  BP: (!) 152/68  Pulse: 92  SpO2: 98%  Weight: 262 lb 3.2 oz (118.9 kg)   Wt Readings from Last 3 Encounters:  10/01/16 256 lb  12 oz (116.5 kg)  09/22/16 256 lb 7 oz (116.3 kg)  09/06/16 260 lb 4.8 oz (118.1 kg)    PHYSICAL EXAM: No change in Physical Exam 10/20/2016 General: NAD Ambulated in the clinic with rolling walker. Daughter rpesent  Neck: No JVD, no thyromegaly or thyroid nodule.  Lungs:  Slight crackles right base. CV: Nondisplaced PMI.  Heart regular S1/S2, no S3/S4, no murmur.  No carotid bruit.  Normal pedal pulses.  Abdomen: Soft, nontender, no hepatosplenomegaly, no distention.  Skin: Intact without lesions or rashes. Warm Neurologic: Alert and oriented x 3.  Psych: Normal affect. Extremities: No clubbing or cyanosis. Bilateral lymphedema.  HEENT: Normal.   ASSESSMENT & PLAN: 1. Chronic diastolic CHF:  NYHA class III symptoms.  Exam is made very difficult by her chronic lymphedema.   I discussed ECHO resulted. ---> 55-60% Gard[place] 1DD.  Volume status ok despite weight gain. Continue current regimen.  - Continue current dose of lasix + metolazone twice a week.  Lasix 80 mg twice a day + metolazone every Tues and Sat.  Renal function stable.  2. Lymphedema: Uses thigh-high compression stockings.   3. CKD: Stage III.  - I reviewed lab work from PCP from 10/19/16 4. Chest pain: LHC 2017 Nonobstructive.  Stop imdur with headache.  5. HTN- elevated. Increase amlodipine to 10 mg daily.  6. OSA- continue CPAP nightly.   Chest circumference 110 cm. She should qualify. We have submitted pre-approval information. Tentatively scheduled for March 21st.    Follow up March 8th with Dr Aundra Dubin.  Kristjan Derner NP-C  10/20/2016

## 2016-10-20 NOTE — Patient Instructions (Addendum)
STOP Imdur.  INCREASE Amlodipine to 10 mg once daily. May "double up" current 5 mg tablets (Take 2 tabs once daily). New Rx has been sent for 10 mg tablets (Take 1 tablet once daily).  Follow up as scheduled.  Do the following things EVERYDAY: 1) Weigh yourself in the morning before breakfast. Write it down and keep it in a log. 2) Take your medicines as prescribed 3) Eat low salt foods-Limit salt (sodium) to 2000 mg per day.  4) Stay as active as you can everyday 5) Limit all fluids for the day to less than 2 liters

## 2016-10-20 NOTE — Progress Notes (Signed)
Advanced Heart Failure Medication Review by a Pharmacist  Does the patient  feel that his/her medications are working for him/her?  yes  Has the patient been experiencing any side effects to the medications prescribed?  no  Does the patient measure his/her own blood pressure or blood glucose at home?  yes   Does the patient have any problems obtaining medications due to transportation or finances?   no  Understanding of regimen: good Understanding of indications: good Potential of compliance: good Patient understands to avoid NSAIDs. Patient understands to avoid decongestants.  Issues to address at subsequent visits: None   Pharmacist comments:  Ms. Liley is a pleasant 71 yo F presenting with her daughter and a current medication list from [facility] who bubble packs her medications on a weekly basis. She reports good compliance with her medications and her only complaint is headaches which has gotten worse over the past few weeks. She has an appointment with a neurologist in a couple of weeks. Have recommended that she take her BP/HR when she has the headaches and document them for [place] to see.   Ruta Hinds. Velva Harman, PharmD, BCPS, CPP Clinical Pharmacist Pager: [phone] Phone: [phone] 10/20/2016 2:30 PM      Time with patient: 14 minutes Preparation and documentation time: 2 minutes Total time: 16 minutes

## 2016-10-20 NOTE — Addendum Note (Signed)
Encounter addended by: Effie Berkshire, RN on: 10/20/2016  3:11 PM<BR>    Actions taken: Medication long-term status modified, Order list changed, Sign clinical note

## 2016-10-21 ENCOUNTER — Ambulatory Visit: Payer: Medicare Other | Admitting: Physical Therapy

## 2016-10-21 DIAGNOSIS — M6281 Muscle weakness (generalized): Secondary | ICD-10-CM

## 2016-10-21 DIAGNOSIS — R262 Difficulty in walking, not elsewhere classified: Secondary | ICD-10-CM

## 2016-10-21 DIAGNOSIS — M25562 Pain in left knee: Principal | ICD-10-CM

## 2016-10-21 DIAGNOSIS — G8929 Other chronic pain: Secondary | ICD-10-CM

## 2016-10-21 NOTE — Therapy (Signed)
[facility] [place], [place], [address] Phone: [phone]   Fax:  [phone]  Physical Therapy Treatment  Patient Details  Name: [name] MRN: [id number] Date of Birth: 11/07/1945 Referring Provider: Earlie Server MD.  Encounter Date: 10/21/2016      PT End of Session - 10/21/16 1027    Visit Number 4   Number of Visits 16   Date for PT Re-Evaluation 12/04/16   PT Start Time 0945   PT Stop Time 1030   PT Time Calculation (min) 45 min   Activity Tolerance Patient tolerated treatment well   Behavior During Therapy [facility] for tasks assessed/performed      Past Medical History:  Diagnosis Date  . Anemia   . Anxiety   . Arthritis   . Brain lesion    2 types  . Cervical spondylosis with myelopathy   . Chest pain    Normal cardiac cath 5/09  . Congestive heart failure (CHF) ([place])   . Depression   . Dumping syndrome   . Edema   . Fatigue   . Fibromyalgia   . History of echocardiogram    a. Echo 03/20/16 (done at [facility] in [place], [place]):  mild LVH, EF 13%, normal diastolic function, mild LAE, MAC, RVSP 25 mmHg  . Hyperlipidemia   . Hypertension   . Hypothyroidism   . Lumbar spondylosis   . Lymphedema    seeing specialist for this  . Restless leg syndrome   . Rotator cuff injury   . Stroke [facility])    TIAs "mini strokes"- unsure of last TIA  . Syncope   . Tremors of nervous system     Past Surgical History:  Procedure Laterality Date  . APPENDECTOMY  1966  . CARDIAC CATHETERIZATION N/A 05/25/2016   Procedure: Right/Left Heart Cath and Coronary Angiography;  Surgeon: Larey Dresser, MD;  Location: [place] [facility];  Service: Cardiovascular;  Laterality: N/A;  . CHOLECYSTECTOMY  2015  . CHOLECYSTECTOMY  2015  . CORONARY ANGIOGRAM  2009   Normal coronaries  . Hip Bursa Surgery Bilateral   . KNEE ARTHROSCOPY Bilateral   . LUMBAR DISC SURGERY     x2  . LUMBAR FUSION    . ROTATOR CUFF REPAIR  Bilateral   . TOTAL ABDOMINAL HYSTERECTOMY    . TOTAL KNEE ARTHROPLASTY Left   . TOTAL KNEE ARTHROPLASTY Right 11/18/2012   Procedure: TOTAL KNEE ARTHROPLASTY;  Surgeon: Yvette Rack., MD;  Location: [place];  Service: Orthopedics;  Laterality: Right;    There were no vitals filed for this visit.      Subjective Assessment - 10/21/16 1022    Subjective Pt reporting today 6/10 pain and reported not taking her pain meds this morning.    Pertinent History Bilateral total knee replacements. Bilateral rotator cuff repairs   Limitations Walking   How long can you stand comfortably? 5-10 minutes.   How long can you walk comfortably? Short distances.   Patient Stated Goals Get out of pain.   Currently in Pain? Yes   Pain Score 6    Pain Location Knee   Pain Orientation Left   Pain Descriptors / Indicators Aching   Pain Type Chronic pain   Pain Frequency Constant   Aggravating Factors  standing, walking   Pain Relieving Factors tens, heat, pain meds            [facility] PT Assessment - 10/21/16 0001  ROM / Strength   AROM / PROM / Strength AROM;Strength     AROM   Overall AROM Comments Full left knee extension and flexion to 110 degrees.                     [facility] Adult PT Treatment/Exercise - 10/21/16 0001      Exercises   Exercises Knee/Hip     Knee/Hip Exercises: Aerobic   Nustep L5 x 10 minutes  800 steps     Knee/Hip Exercises: Seated   Long Arc Quad Strengthening;10 reps     Knee/Hip Exercises: Supine   Short Arc Quad Sets 15 reps   Heel Slides 10 reps     Modalities   Modalities Electrical Stimulation     Moist Heat Therapy   Number Minutes Moist Heat 15 Minutes     Electrical Stimulation   Electrical Stimulation Location Left Pes Anserine   Electrical Stimulation Action Constant Pre-mod   Electrical Stimulation Parameters 80-150 Hz   Electrical Stimulation Goals Pain     Manual Therapy   Manual Therapy Joint mobilization;Soft tissue  mobilization   Manual therapy comments patella mobilizations   Soft tissue mobilization  posterior hamstring tendons and pes anserine                PT Education - 10/21/16 1026    Education provided Yes   Education Details Importance of exercise and HEP   Person(s) Educated Patient   Methods Explanation   Comprehension Verbalized understanding          PT Short Term Goals - 10/05/16 1037      PT SHORT TERM GOAL #1   Title STG's=LTG's.           PT Long Term Goals - 10/21/16 1030      PT LONG TERM GOAL #1   Title Independent with a HEP.   Time 8   Period Weeks   Status On-going     PT LONG TERM GOAL #2   Title Stand 20 minutes with left knee pain not > 3/10.   Time 8   Period Weeks   Status On-going     PT LONG TERM GOAL #3   Title Perform ADL's with pain not > 3/10.   Time 8   Period Weeks   Status On-going     PT LONG TERM GOAL #4   Title Pt to be walking 10 minutes twice a day to promote lymphatic circulation    Time 6   Period Weeks   Status On-going     PT LONG TERM GOAL #5   Title Pt to state she is able to walk for 10 minutes without difficulty ( pt limited to 10 minutes due to back issues)   Time 8   Period Weeks   Status On-going               Plan - 10/21/16 1028    Clinical Impression Statement Patient arriving today complaining of 6/10 pain in left knee. Pt also with tenderness in pes anserine and hamstring insertions. pt able to tolerate manual therapy and reported 4/10 pain in left knee at end of session.    Rehab Potential Good   PT Frequency 2x / week   PT Duration 8 weeks   PT Treatment/Interventions Iontophoresis 79m/ml Dexamethasone;Moist Heat;Patient/family education;Therapeutic exercise;Therapeutic activities;Manual techniques;Vasopneumatic Device;Dry needling   PT Next Visit Plan ionto, manual, progress strengthening   Consulted and Agree with Plan of Care  Patient      Patient will benefit from skilled  therapeutic intervention in order to improve the following deficits and impairments:  Decreased activity tolerance, Decreased strength, Pain  Visit Diagnosis: Chronic pain of left knee  Muscle weakness (generalized)  Difficulty in walking, not elsewhere classified     Problem List Patient Active Problem List   Diagnosis Date Noted  . Unstable angina ([place]) 05/21/2016  . Chronic diastolic CHF (congestive heart failure) ([place]) 03/29/2016  . Normal coronary arteries 2009 01/14/2015  . Obesity-BMI 40 01/14/2015  . Restless leg 01/14/2015  . Chest pain 01/14/2015  . Back pain 01/14/2015  . Renal insufficiency 01/14/2015  . Dementia- mild memory issues 01/14/2015  . Occult blood positive stool 12/14/2014  . Anemia 12/14/2014  . Family history of colon cancer 12/14/2014  . Change in bowel habits 12/14/2014  . GERD (gastroesophageal reflux disease) 12/14/2014  . Dyspnea 05/17/2012  . Lymphedema 05/17/2012  . Weight gain 05/17/2012  . HTN (hypertension) 05/17/2012    Oretha Caprice, MPT 10/21/2016, 10:33 AM  [facility] [address] [place], [place], [address] Phone: [phone]   Fax:  [phone]  Name: [name] MRN: [id number] Date of Birth: Apr 20, 1946

## 2016-10-23 ENCOUNTER — Ambulatory Visit: Payer: Medicare Other | Attending: Orthopedic Surgery | Admitting: Physical Therapy

## 2016-10-23 DIAGNOSIS — M25562 Pain in left knee: Secondary | ICD-10-CM | POA: Diagnosis present

## 2016-10-23 DIAGNOSIS — G8929 Other chronic pain: Secondary | ICD-10-CM | POA: Diagnosis present

## 2016-10-23 DIAGNOSIS — R262 Difficulty in walking, not elsewhere classified: Secondary | ICD-10-CM | POA: Diagnosis present

## 2016-10-23 DIAGNOSIS — M6281 Muscle weakness (generalized): Secondary | ICD-10-CM

## 2016-10-23 NOTE — Therapy (Signed)
[facility] [place], [place], [address] Phone: [phone]   Fax:  [phone]  Physical Therapy Treatment  Patient Details  Name: [name] MRN: [id number] Date of Birth: 1945/12/03 Referring Provider: Earlie Server MD.  Encounter Date: 10/23/2016      PT End of Session - 10/23/16 1030    Visit Number 6  updated due to missed visit number   Number of Visits 16   Date for PT Re-Evaluation 12/04/16   PT Start Time 0948   PT Stop Time 1042   PT Time Calculation (min) 54 min   Activity Tolerance Patient tolerated treatment well   Behavior During Therapy [facility] for tasks assessed/performed      Past Medical History:  Diagnosis Date  . Anemia   . Anxiety   . Arthritis   . Brain lesion    2 types  . Cervical spondylosis with myelopathy   . Chest pain    Normal cardiac cath 5/09  . Congestive heart failure (CHF) ([place])   . Depression   . Dumping syndrome   . Edema   . Fatigue   . Fibromyalgia   . History of echocardiogram    a. Echo 03/20/16 (done at [facility] in [place], [place]):  mild LVH, EF 69%, normal diastolic function, mild LAE, MAC, RVSP 25 mmHg  . Hyperlipidemia   . Hypertension   . Hypothyroidism   . Lumbar spondylosis   . Lymphedema    seeing specialist for this  . Restless leg syndrome   . Rotator cuff injury   . Stroke [facility])    TIAs "mini strokes"- unsure of last TIA  . Syncope   . Tremors of nervous system     Past Surgical History:  Procedure Laterality Date  . APPENDECTOMY  1966  . CARDIAC CATHETERIZATION N/A 05/25/2016   Procedure: Right/Left Heart Cath and Coronary Angiography;  Surgeon: Larey Dresser, MD;  Location: [place] [facility];  Service: Cardiovascular;  Laterality: N/A;  . CHOLECYSTECTOMY  2015  . CHOLECYSTECTOMY  2015  . CORONARY ANGIOGRAM  2009   Normal coronaries  . Hip Bursa Surgery Bilateral   . KNEE ARTHROSCOPY Bilateral   . LUMBAR DISC SURGERY     x2  .  LUMBAR FUSION    . ROTATOR CUFF REPAIR Bilateral   . TOTAL ABDOMINAL HYSTERECTOMY    . TOTAL KNEE ARTHROPLASTY Left   . TOTAL KNEE ARTHROPLASTY Right 11/18/2012   Procedure: TOTAL KNEE ARTHROPLASTY;  Surgeon: Yvette Rack., MD;  Location: [place];  Service: Orthopedics;  Laterality: Right;    There were no vitals filed for this visit.      Subjective Assessment - 10/23/16 0949    Subjective doing well, still having some pain in L knee.   Pertinent History Bilateral total knee replacements. Bilateral rotator cuff repairs   How long can you walk comfortably? < 5 min   Patient Stated Goals Get out of pain.   Currently in Pain? Yes   Pain Score 6    Pain Location Knee   Pain Orientation Left   Pain Descriptors / Indicators Aching   Pain Type Chronic pain   Pain Onset More than a month ago   Pain Frequency Constant                         [facility] Adult PT Treatment/Exercise - 10/23/16 0950      Knee/Hip Exercises: Aerobic  Nustep L5 x 10 minutes     Knee/Hip Exercises: Seated   Long Arc Quad Both;2 sets;10 reps;Weights   Long Arc Quad Weight 2 lbs.   Marching Limitations 2x10 with 2# bil   Hamstring Curl Both;2 sets;10 reps   Hamstring Limitations red theraband   Sit to Sand 2 sets;10 reps;without UE support     Modalities   Modalities Electrical Stimulation     Moist Heat Therapy   Number Minutes Moist Heat 15 Minutes   Moist Heat Location Knee     Electrical Stimulation   Electrical Stimulation Location Left Pes Anserine   Electrical Stimulation Action pre mod   Electrical Stimulation Parameters to tolerance x 15 min   Electrical Stimulation Goals Pain     Manual Therapy   Soft tissue mobilization  posterior hamstring tendons and pes anserine                  PT Short Term Goals - 10/05/16 1037      PT SHORT TERM GOAL #1   Title STG's=LTG's.           PT Long Term Goals - 10/21/16 1030      PT LONG TERM GOAL #1   Title  Independent with a HEP.   Time 8   Period Weeks   Status On-going     PT LONG TERM GOAL #2   Title Stand 20 minutes with left knee pain not > 3/10.   Time 8   Period Weeks   Status On-going     PT LONG TERM GOAL #3   Title Perform ADL's with pain not > 3/10.   Time 8   Period Weeks   Status On-going     PT LONG TERM GOAL #4   Title Pt to be walking 10 minutes twice a day to promote lymphatic circulation    Time 6   Period Weeks   Status On-going     PT LONG TERM GOAL #5   Title Pt to state she is able to walk for 10 minutes without difficulty ( pt limited to 10 minutes due to back issues)   Time 8   Period Weeks   Status On-going               Plan - 10/23/16 1030    Clinical Impression Statement Pt continues to have tenderness at L hamstring instertions laterally.  Tolerated strengthening well today, with pain improved with modalities.  Will continue to benefit from PT to maximize function.   PT Treatment/Interventions Iontophoresis 21m/ml Dexamethasone;Moist Heat;Patient/family education;Therapeutic exercise;Therapeutic activities;Manual techniques;Vasopneumatic Device;Dry needling   PT Next Visit Plan ionto, manual, progress strengthening, try gait with cane (?unsure if pt will ever be safe with cane)   Consulted and Agree with Plan of Care Patient      Patient will benefit from skilled therapeutic intervention in order to improve the following deficits and impairments:  Decreased activity tolerance, Decreased strength, Pain  Visit Diagnosis: Chronic pain of left knee  Muscle weakness (generalized)  Difficulty in walking, not elsewhere classified     Problem List Patient Active Problem List   Diagnosis Date Noted  . [place] 05/21/2016  . Chronic diastolic [place] (congestive heart [place] 03/29/2016  . Normal coronary arteries 2009 01/14/2015  . Obesity-BMI 40 01/14/2015  . Restless leg 01/14/2015  . Chest pain 01/14/2015  . Back  pain 01/14/2015  . Renal insufficiency 01/14/2015  . Dementia- mild memory issues 01/14/2015  .  Occult blood positive stool 12/14/2014  . Anemia 12/14/2014  . Family history of colon cancer 12/14/2014  . Change in bowel habits 12/14/2014  . GERD (gastroesophageal reflux disease) 12/14/2014  . Dyspnea 05/17/2012  . Lymphedema 05/17/2012  . Weight gain 05/17/2012  . HTN (hypertension) 05/17/2012      Laureen Abrahams, PT, DPT 10/23/16 10:33 AM    [facility] [address] [place], [place], [address] Phone: [phone]   Fax:  [phone]  Name: [name] MRN: [id number] Date of Birth: May 29, 1946

## 2016-10-26 ENCOUNTER — Ambulatory Visit: Payer: Medicare Other

## 2016-10-26 ENCOUNTER — Encounter: Payer: Self-pay | Admitting: Physical Therapy

## 2016-10-26 ENCOUNTER — Ambulatory Visit: Payer: Medicare Other | Admitting: Physical Therapy

## 2016-10-26 DIAGNOSIS — M25562 Pain in left knee: Secondary | ICD-10-CM | POA: Diagnosis not present

## 2016-10-26 DIAGNOSIS — G8929 Other chronic pain: Secondary | ICD-10-CM

## 2016-10-26 DIAGNOSIS — M6281 Muscle weakness (generalized): Secondary | ICD-10-CM

## 2016-10-26 DIAGNOSIS — R262 Difficulty in walking, not elsewhere classified: Secondary | ICD-10-CM

## 2016-10-26 NOTE — Therapy (Signed)
[facility] [place], [place], [address] Phone: [phone]   Fax:  [phone]  Physical Therapy Treatment  Patient Details  Name: [name] MRN: [id number] Date of Birth: 04-06-1946 Referring Provider: Earlie Server MD.  Encounter Date: 10/26/2016      PT End of Session - 10/26/16 1001    Visit Number 7   Number of Visits 16   Date for PT Re-Evaluation 12/04/16   PT Start Time 0945   PT Stop Time 1030   PT Time Calculation (min) 45 min   Activity Tolerance Patient tolerated treatment well   Behavior During Therapy [facility] for tasks assessed/performed      Past Medical History:  Diagnosis Date  . Anemia   . Anxiety   . Arthritis   . Brain lesion    2 types  . Cervical spondylosis with myelopathy   . Chest pain    Normal cardiac cath 5/09  . Congestive heart failure (CHF) ([place])   . Depression   . Dumping syndrome   . Edema   . Fatigue   . Fibromyalgia   . History of echocardiogram    a. Echo 03/20/16 (done at [facility] in [place], [place]):  mild LVH, EF 06%, normal diastolic function, mild LAE, MAC, RVSP 25 mmHg  . Hyperlipidemia   . Hypertension   . Hypothyroidism   . Lumbar spondylosis   . Lymphedema    seeing specialist for this  . Restless leg syndrome   . Rotator cuff injury   . Stroke [facility])    TIAs "mini strokes"- unsure of last TIA  . Syncope   . Tremors of nervous system     Past Surgical History:  Procedure Laterality Date  . APPENDECTOMY  1966  . CARDIAC CATHETERIZATION N/A 05/25/2016   Procedure: Right/Left Heart Cath and Coronary Angiography;  Surgeon: Larey Dresser, MD;  Location: [place] [facility];  Service: Cardiovascular;  Laterality: N/A;  . CHOLECYSTECTOMY  2015  . CHOLECYSTECTOMY  2015  . CORONARY ANGIOGRAM  2009   Normal coronaries  . Hip Bursa Surgery Bilateral   . KNEE ARTHROSCOPY Bilateral   . LUMBAR DISC SURGERY     x2  . LUMBAR FUSION    . ROTATOR CUFF REPAIR  Bilateral   . TOTAL ABDOMINAL HYSTERECTOMY    . TOTAL KNEE ARTHROPLASTY Left   . TOTAL KNEE ARTHROPLASTY Right 11/18/2012   Procedure: TOTAL KNEE ARTHROPLASTY;  Surgeon: Yvette Rack., MD;  Location: [place];  Service: Orthopedics;  Laterality: Right;    There were no vitals filed for this visit.      Subjective Assessment - 10/26/16 0956    Subjective Pt reporting mild pain of 4/10 in the back of the left knee.    Pertinent History Bilateral total knee replacements. Bilateral rotator cuff repairs   Limitations Walking   How long can you stand comfortably? 5-10 minutes.   How long can you walk comfortably? < 5 min   Patient Stated Goals Get out of pain, walk better.    Currently in Pain? Yes   Pain Score 4    Pain Location Knee   Pain Orientation Left   Pain Descriptors / Indicators Aching   Pain Type Chronic pain   Pain Onset More than a month ago   Pain Frequency Constant   Aggravating Factors  standing walking   Pain Relieving Factors tens, heat, pain meds   Multiple Pain Sites No  PT Education - 10/26/16 0957    Education provided Yes   Education Details Pt enouraged to work on sit to stand from a firm surface chair as well as continue her HEP   Person(s) Educated Patient   Methods Explanation   Comprehension Verbalized understanding;Returned demonstration          PT Short Term Goals - 10/05/16 1037      PT SHORT TERM GOAL #1   Title STG's=LTG's.           PT Long Term Goals - 10/26/16 1008      PT LONG TERM GOAL #1   Title Independent with a HEP.   Status On-going     PT LONG TERM GOAL #2   Title Stand 20 minutes with left knee pain not > 3/10.   Baseline pt reporting 4-5/10 with doing dishes and standing for 20 minutes   Time 8   Period Weeks   Status On-going     PT LONG TERM GOAL #3   Title Perform ADL's with pain not > 3/10.   Status On-going               Plan - 10/26/16  1004    Clinical Impression Statement Pt continues to have tenderness at left hamstring insertion and on lateral left knee. pt tolerated strengthening and functional exercises today. Pt feels E-stim is working well. Continue to progress toward goals set. No new goals met today.    Rehab Potential Good   PT Frequency 2x / week   PT Duration 8 weeks   PT Treatment/Interventions Iontophoresis 29m/ml Dexamethasone;Moist Heat;Patient/family education;Therapeutic exercise;Therapeutic activities;Manual techniques;Vasopneumatic Device;Dry needling   PT Next Visit Plan ionto, manual, progress strengthening, try gait with cane (?unsure if pt will ever be safe with cane)   Consulted and Agree with Plan of Care Patient      Patient will benefit from skilled therapeutic intervention in order to improve the following deficits and impairments:  Decreased activity tolerance, Decreased strength, Pain  Visit Diagnosis: Chronic pain of left knee  Muscle weakness (generalized)  Difficulty in walking, not elsewhere classified  Difficulty walking     Problem List Patient Active Problem List   Diagnosis Date Noted  . Unstable angina ([place] 05/21/2016  . Chronic diastolic CHF (congestive heart failure) ([place] 03/29/2016  . Normal coronary arteries 2009 01/14/2015  . Obesity-BMI 40 01/14/2015  . Restless leg 01/14/2015  . Chest pain 01/14/2015  . Back pain 01/14/2015  . Renal insufficiency 01/14/2015  . Dementia- mild memory issues 01/14/2015  . Occult blood positive stool 12/14/2014  . Anemia 12/14/2014  . Family history of colon cancer 12/14/2014  . Change in bowel habits 12/14/2014  . GERD (gastroesophageal reflux disease) 12/14/2014  . Dyspnea 05/17/2012  . Lymphedema 05/17/2012  . Weight gain 05/17/2012  . HTN (hypertension) 05/17/2012    JOretha Caprice MPT  10/26/2016, 10:23 AM  [facility] [place] [address]Phone:  [phone]  Fax:  [phone] Name: CJAGGER BEAHMMRN: [id number] of Birth: 6Jul 19, 1947

## 2016-10-28 ENCOUNTER — Ambulatory Visit: Payer: Medicare Other | Admitting: Physical Therapy

## 2016-10-28 DIAGNOSIS — R262 Difficulty in walking, not elsewhere classified: Secondary | ICD-10-CM

## 2016-10-28 DIAGNOSIS — G8929 Other chronic pain: Secondary | ICD-10-CM

## 2016-10-28 DIAGNOSIS — M25562 Pain in left knee: Secondary | ICD-10-CM

## 2016-10-28 DIAGNOSIS — M6281 Muscle weakness (generalized): Secondary | ICD-10-CM

## 2016-10-28 NOTE — Therapy (Signed)
[facility] [place], [place], [address] Phone: [phone]   Fax:  [phone]  Physical Therapy Treatment  Patient Details  Name: [name] MRN: [id number] Date of Birth: 08-13-1946 Referring Provider: Earlie Server, MD  Encounter Date: 10/28/2016      PT End of Session - 10/28/16 0959    Visit Number 8   Number of Visits 16   Date for PT Re-Evaluation 12/04/16   PT Start Time 0945   PT Stop Time 1030   PT Time Calculation (min) 45 min   Activity Tolerance Patient tolerated treatment well   Behavior During Therapy [facility] for tasks assessed/performed      Past Medical History:  Diagnosis Date  . Anemia   . Anxiety   . Arthritis   . Brain lesion    2 types  . Cervical spondylosis with myelopathy   . Chest pain    Normal cardiac cath 5/09  . Congestive heart failure (CHF) ([place])   . Depression   . Dumping syndrome   . Edema   . Fatigue   . Fibromyalgia   . History of echocardiogram    a. Echo 03/20/16 (done at [facility] in [place], [place]):  mild LVH, EF 26%, normal diastolic function, mild LAE, MAC, RVSP 25 mmHg  . Hyperlipidemia   . Hypertension   . Hypothyroidism   . Lumbar spondylosis   . Lymphedema    seeing specialist for this  . Restless leg syndrome   . Rotator cuff injury   . Stroke [facility])    TIAs "mini strokes"- unsure of last TIA  . Syncope   . Tremors of nervous system     Past Surgical History:  Procedure Laterality Date  . APPENDECTOMY  1966  . CARDIAC CATHETERIZATION N/A 05/25/2016   Procedure: Right/Left Heart Cath and Coronary Angiography;  Surgeon: Larey Dresser, MD;  Location: [place] CV [facility];  Service: Cardiovascular;  Laterality: N/A;  . CHOLECYSTECTOMY  2015  . CHOLECYSTECTOMY  2015  . CORONARY ANGIOGRAM  2009   Normal coronaries  . Hip Bursa Surgery Bilateral   . KNEE ARTHROSCOPY Bilateral   . LUMBAR DISC SURGERY     x2  . LUMBAR FUSION    . ROTATOR CUFF REPAIR  Bilateral   . TOTAL ABDOMINAL HYSTERECTOMY    . TOTAL KNEE ARTHROPLASTY Left   . TOTAL KNEE ARTHROPLASTY Right 11/18/2012   Procedure: TOTAL KNEE ARTHROPLASTY;  Surgeon: Yvette Rack., MD;  Location: [place];  Service: Orthopedics;  Laterality: Right;    There were no vitals filed for this visit.      Subjective Assessment - 10/28/16 0952    Subjective Patient reporting 7-8/10 pain today in left knee. Pt also reporting back pain of 8/10. pt unable to perform seated exercises today due to back pain.    Pertinent History Bilateral total knee replacements. Bilateral rotator cuff repairs   Limitations Walking   How long can you stand comfortably? 5-10 minutes.   How long can you walk comfortably? < 5 min   Patient Stated Goals Get out of pain, walk better.    Pain Score 8    Pain Location Knee   Pain Orientation Left   Pain Descriptors / Indicators Aching   Pain Type Chronic pain   Pain Onset More than a month ago   Pain Frequency Constant   Aggravating Factors  standing walking, seated unsupported   Pain Relieving Factors tens, heat, pain meds  Multiple Pain Sites Yes   Pain Score 8   Pain Location Back   Pain Descriptors / Indicators Aching;Throbbing   Pain Type Chronic pain   Pain Onset More than a month ago   Pain Frequency Constant   Aggravating Factors  sitting unsupported, lying flat, walking   Pain Relieving Factors lying reclined, sitting supported, pain meds   Effect of Pain on Daily Activities unable to sit unsupported            [facility] PT Assessment - 10/28/16 0001      Assessment   Medical Diagnosis left knee pain   Referring Provider Earlie Server, MD     ROM / Strength   AROM / PROM / Strength AROM;Strength     AROM   Overall AROM Comments left knee flexion: 108 degrees                     [facility] Adult PT Treatment/Exercise - 10/28/16 0001      Exercises   Exercises Knee/Hip     Knee/Hip Exercises: Aerobic   Nustep --  Nustep not  available today     Knee/Hip Exercises: Supine   Quad Sets 15 reps   Short Arc Quad Sets 15 reps   Straight Leg Raises 15 reps   Other Supine Knee/Hip Exercises supine marching     Modalities   Modalities Electrical Stimulation     Moist Heat Therapy   Number Minutes Moist Heat 15 Minutes   Moist Heat Location Knee     Electrical Stimulation   Electrical Stimulation Location les pes anserine, inferior patella tendon   Electrical Stimulation Action pre m   Electrical Stimulation Parameters 80-150 Hz x 15 minutes   Electrical Stimulation Goals Pain     Manual Therapy   Manual therapy comments patella mobilization                PT Education - 10/28/16 0957    Education provided Yes   Education Details Pt reported she has been working on sit to stand, pt encouraged to continue to work on transfers. pt reporting her TENS unit is working well.    Person(s) Educated Patient   Methods Explanation   Comprehension Verbalized understanding          PT Short Term Goals - 10/05/16 1037      PT SHORT TERM GOAL #1   Title STG's=LTG's.           PT Long Term Goals - 10/28/16 1006      PT LONG TERM GOAL #1   Title Independent with a HEP.   Time 8   Period Weeks   Status On-going     PT LONG TERM GOAL #2   Title Stand 20 minutes with left knee pain not > 3/10.   Baseline pt reporting 4-5/10 with doing dishes and standing for 20 minutes   Time 8   Period Weeks   Status On-going     PT LONG TERM GOAL #3   Title Perform ADL's with pain not > 3/10.   Time 8   Period Weeks   Status On-going     PT LONG TERM GOAL #4   Title Pt to be walking 10 minutes twice a day to promote lymphatic circulation      PT LONG TERM GOAL #5   Period Weeks   Status On-going               Plan -  10/28/16 0959    Clinical Impression Statement Pt continues to have tenderness to left hamstring insertion and lateral knee. Pt reported increased pain in her back today of  8/10. Pt performed all exercises in supine on mat table on wedge. Pt feels she has been improving since beginning therapy. Continue skilled PT to address pt's impairments.    Rehab Potential Good   PT Frequency 2x / week   PT Duration 8 weeks   PT Treatment/Interventions Iontophoresis 66m/ml Dexamethasone;Moist Heat;Patient/family education;Therapeutic exercise;Therapeutic activities;Manual techniques;Vasopneumatic Device;Dry needling   PT Next Visit Plan ionto, manual, progress strengthening, try gait with cane (?unsure if pt will ever be safe with cane) Pt with increased pain today during supine exercises in her low back. Did not tolerate large mat table well. Next visit try individual adjustable massage table   PT Home Exercise Plan Instructed in TENS use, continue SLR, sit to stand, supine marching, LAQ   Consulted and Agree with Plan of Care Patient      Patient will benefit from skilled therapeutic intervention in order to improve the following deficits and impairments:  Decreased activity tolerance, Decreased strength, Pain, Postural dysfunction, Impaired perceived functional ability, Decreased balance, Decreased mobility  Visit Diagnosis: Muscle weakness (generalized)  Chronic pain of left knee  Difficulty in walking, not elsewhere classified  Difficulty walking     Problem List Patient Active Problem List   Diagnosis Date Noted  . Unstable angina ([place] 05/21/2016  . Chronic diastolic CHF (congestive heart failure) [place] 03/29/2016  . Normal coronary arteries 2009 01/14/2015  . Obesity-BMI 40 01/14/2015  . Restless leg 01/14/2015  . Chest pain 01/14/2015  . Back pain 01/14/2015  . Renal insufficiency 01/14/2015  . Dementia- mild memory issues 01/14/2015  . Occult blood positive stool 12/14/2014  . Anemia 12/14/2014  . Family history of colon cancer 12/14/2014  . Change in bowel habits 12/14/2014  . GERD (gastroesophageal reflux disease) 12/14/2014  . Dyspnea  05/17/2012  . Lymphedema 05/17/2012  . Weight gain 05/17/2012  . HTN (hypertension) 05/17/2012    JOretha Caprice MPT 10/28/2016, 10:34 AM  [id number] [address]Phone: [phone]  Fax:  [phone] Name: CDORETTA REMMERTMRN: [id number] of Birth: [birth year]

## 2016-10-29 ENCOUNTER — Encounter (HOSPITAL_COMMUNITY): Payer: Self-pay | Admitting: *Deleted

## 2016-10-29 ENCOUNTER — Ambulatory Visit (HOSPITAL_COMMUNITY)
Admission: RE | Admit: 2016-10-29 | Discharge: 2016-10-29 | Disposition: A | Discharge status: Home / Self Care | Payer: Medicare Other | Source: Ambulatory Visit | Attending: Cardiology | Admitting: Cardiology

## 2016-10-29 VITALS — BP 142/76 | HR 98 | Wt 254.0 lb

## 2016-10-29 DIAGNOSIS — M797 Fibromyalgia: Secondary | ICD-10-CM | POA: Diagnosis not present

## 2016-10-29 DIAGNOSIS — I130 Hypertensive heart and chronic kidney disease with heart failure and stage 1 through stage 4 chronic kidney disease, or unspecified chronic kidney disease: Secondary | ICD-10-CM | POA: Diagnosis not present

## 2016-10-29 DIAGNOSIS — G8929 Other chronic pain: Secondary | ICD-10-CM | POA: Diagnosis not present

## 2016-10-29 DIAGNOSIS — R079 Chest pain, unspecified: Secondary | ICD-10-CM

## 2016-10-29 DIAGNOSIS — I890 Lymphedema, not elsewhere classified: Secondary | ICD-10-CM | POA: Diagnosis not present

## 2016-10-29 DIAGNOSIS — N183 Chronic kidney disease, stage 3 (moderate): Secondary | ICD-10-CM | POA: Insufficient documentation

## 2016-10-29 DIAGNOSIS — G4733 Obstructive sleep apnea (adult) (pediatric): Secondary | ICD-10-CM | POA: Diagnosis not present

## 2016-10-29 DIAGNOSIS — Z7982 Long term (current) use of aspirin: Secondary | ICD-10-CM | POA: Diagnosis not present

## 2016-10-29 DIAGNOSIS — I10 Essential (primary) hypertension: Secondary | ICD-10-CM

## 2016-10-29 DIAGNOSIS — M549 Dorsalgia, unspecified: Secondary | ICD-10-CM | POA: Diagnosis not present

## 2016-10-29 DIAGNOSIS — I5032 Chronic diastolic (congestive) heart failure: Secondary | ICD-10-CM | POA: Diagnosis not present

## 2016-10-29 DIAGNOSIS — Z79899 Other long term (current) drug therapy: Secondary | ICD-10-CM | POA: Diagnosis not present

## 2016-10-29 DIAGNOSIS — F0390 Unspecified dementia without behavioral disturbance: Secondary | ICD-10-CM | POA: Insufficient documentation

## 2016-10-29 NOTE — Patient Instructions (Signed)
STOP Imdur. Remove from your bubble pack at home.  Pill is [place], oval, and has "[id number]" on it.  Follow up with Dr. Aundra Dubin in 6-8 weeks.  Do the following things EVERYDAY: 1) Weigh yourself in the morning before breakfast. Write it down and keep it in a log. 2) Take your medicines as prescribed 3) Eat low salt foods-Limit salt (sodium) to 2000 mg per day.  4) Stay as active as you can everyday 5) Limit all fluids for the day to less than 2 liters

## 2016-10-29 NOTE — Progress Notes (Signed)
Advanced Heart Failure Clinic Note   Primary Care: Dr. Edrick Oh Primary Cardiologist: Dr. Angelena Form Renal: Dr Joelyn Oms HF Cardiology: Dr. Aundra Dubin  HPI: [name] is a 71 y.o. with history of chronic diastolic CHF (Echo 10/04/92 LVEF 65%, Mild LAE), chronic lymphedema, chronic back pain, HTN, CKD stage 3, and fibromyalgia.    Admitted several times in 02/2016 to [facility] with CHF and chest pain.  Has been tried on spironolactone + metolazone alone as diuretic regimen. Follows with renal as above.   Seen in [facility] clinic 04/16/16. Thought to have mild volume overload. Lasix dose increased to 40 mg daily.  She was admitted in 9/17 with chest pain and AKI.  She had seen nephrology and was given metolazone to take daily along with Lasix, creatinine rose considerably.  Lasix was decreased to 40 mg bid.  When creatinine improved, she had right and left heart cath showing nonobstructive CAD and near-normal filling pressures.  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    Pt presents for follow up today.  Down 8 lbs from last visit. Feeling a little better. Still having headaches. Imdur has still been placed in her bubble packs. Hasn't tried tylenol. Weight at home 252 at home.  Sleeps in recliner chronically, but trying to work back into her bed with the [facility] elevated. Mostly limited by her back in those regards.  Watching her salt and fluid intake. Taking all medications as directed.                                                                                                                                                                      Labs (9/17): K 4.6, creatinine 1.55, BNP 63 Labs (10/17): K 4.7, creatinine 1.8 Labs ( 09/06/2016): K 3.7 Creatinine 2.05  Labs (1/18): K 5.1, creatinine 2.03 Labs 10/01/2016: K 4.1 Creatinine 2.25 Labs 10/19/2016: K 4.3 Creatinine 1.87   Past Medical History 1. Chronic diastolic CHF - Echo 1/74/08 LVEF 65%, Mild LAE -  RHC (10/17): mean RA 4, PA 25/11, mean PCWP 5, CI 2.4.  2. CKD stage 3: Follows with nephology. Baseline appears to be creatinine 1.4-1.6.  3. Chronic lymphedema: Has had wraps.  4. HTN 5. Dementia 6. Fibromyalgia 7. Microvascular angina:  - Normal coronaries on Cath 2009 - Stress Myoview 04/09/16 with no ischemia - LHC (10/17) with mild nonobstructive disease.  8. ECHO 10/13/2016: EF 55-60% Grade IDD  FH: Mother with CHF, brother with "heart disease."  Another brother with h/o MI.   Current Outpatient Prescriptions  Medication Sig Dispense Refill  . alendronate (FOSAMAX) 70 MG tablet Take 70 mg by mouth every Friday. Take with a full glass of water on an empty stomach.     Marland Kitchen  amLODipine (NORVASC) 10 MG tablet Take 1 tablet (10 mg total) by mouth daily. 30 tablet 6  . aspirin 81 MG tablet Take 81 mg by mouth at bedtime.    . baclofen (LIORESAL) 10 MG tablet Take 10 mg by mouth 3 (three) times daily.    Marland Kitchen donepezil (ARICEPT) 10 MG tablet Take 10 mg by mouth at bedtime.     Marland Kitchen escitalopram (LEXAPRO) 20 MG tablet Take 30 mg by mouth daily with supper.     . fentaNYL (DURAGESIC - DOSED MCG/HR) 25 MCG/HR patch Place 25 mcg onto the skin every 3 (three) days.     . folic acid (FOLVITE) 1 MG tablet Take 1 mg by mouth 2 (two) times daily.    . furosemide (LASIX) 40 MG tablet Take 2 tablets (80 mg total) by mouth 2 (two) times daily. 120 tablet 3  . HYDROcodone-acetaminophen (NORCO) 7.5-325 MG tablet Take 1 tablet by mouth every 6 (six) hours as needed for moderate pain.    . isosorbide mononitrate (IMDUR) 30 MG 24 hr tablet Take 30 mg by mouth daily.    Marland Kitchen lamoTRIgine (LAMICTAL) 100 MG tablet Take 100 mg by mouth 2 (two) times daily.     Marland Kitchen levothyroxine (SYNTHROID) 75 MCG tablet Take 75 mcg by mouth daily before breakfast.     . memantine (NAMENDA) 10 MG tablet Take 10 mg by mouth 2 (two) times daily.    . metolazone (ZAROXOLYN) 2.5 MG tablet Take 1 tablet (2.5 mg total) by mouth 2 (two) times a  week. Every Tuesday and Saturday 15 tablet 6  . ranitidine (ZANTAC) 150 MG tablet Take 150 mg by mouth 2 (two) times daily.    Marland Kitchen rOPINIRole (REQUIP) 1 MG tablet Take 1 mg by mouth 3 (three) times daily.    . sennosides-docusate sodium (SENOKOT-S) 8.6-50 MG tablet Take 2 tablets by mouth at bedtime.    Marland Kitchen spironolactone (ALDACTONE) 25 MG tablet Take 25 mg by mouth 2 (two) times daily.    Marland Kitchen albuterol (PROVENTIL HFA;VENTOLIN HFA) 108 (90 Base) MCG/ACT inhaler Inhale 1-2 puffs into the lungs every 6 (six) hours as needed for wheezing or shortness of breath. (Patient not taking: Reported on 10/01/2016) 1 Inhaler 0  . pravastatin (PRAVACHOL) 80 MG tablet Take 80 mg by mouth at bedtime.      No current facility-administered medications for this encounter.     Allergies  Allergen Reactions  . Oxycodone Hives, Itching and Other (See Comments)    Other reaction(s): Delusions (intolerance)  . Hydromorphone Other (See Comments)    Hallucinations, altered mental state      Social History   Social History  . Marital status: Widowed    Spouse name: N/A  . Number of children: 3  . Years of education: N/A   Occupational History  . retired    Social History Main Topics  . Smoking status: Never Smoker  . Smokeless tobacco: Never Used  . Alcohol use No  . Drug use: No  . Sexual activity: Not Currently   Other Topics Concern  . Not on file   Social History Narrative   Pt does use caffeine. Lives alone. 3 children. Retired.    Vitals:   10/29/16 1044  BP: (!) 142/76  Pulse: 98  SpO2: 96%  Weight: 254 lb (115.2 kg)   Wt Readings from Last 3 Encounters:  10/29/16 254 lb (115.2 kg)  10/20/16 262 lb 3.2 oz (118.9 kg)  10/01/16 256 lb 12 oz (116.5  kg)    PHYSICAL EXAM: General: NAD. Ambulated into clinic with rolling walker. Grandson present.   Neck: Thick, No JVD appreciated. No thyromegaly or thyroid nodule.  Lungs: Slightly diminished right basilar sounds.  CV: Nondisplaced PMI.   Heart regular S1/S2, no S3/S4, no murmur.  No carotid bruit.  Normal pedal pulses.  Abdomen: Soft, NT, ND, no HSM. No bruits or masses. +BS  Skin: Intact without lesions or rashes. Warm. Neurologic: Alert and oriented x 3.  Psych: Normal affect. Extremities: No clubbing or cyanosis. Bilateral lymphedema. Trace to 1+ pitting.  HEENT: Normal.   ASSESSMENT & PLAN: 1. Chronic diastolic CHF:   - NYHA Class III symptoms.  - Exam complicated by chronic lymphedema.    - Plan for Cardiomems device  - Echo 10/13/16 LVEF 55-60% Gard[place] 1DD.  - Volume status stable. Weight down 8 lbs from last visit.  - Continue lasix 80 mg BID and metolazone every Tues and Sat.   - Renal function stable on recent labs.  - Reinforced fluid restriction to < 2 L daily, sodium restriction to less than 2000 mg daily, and the importance of daily weights.   2. Lymphedema: - Uses thigh-high compression stockings.  No change to current plans.  3. CKD: Stage III.  - Stable on lab work from PCP from 10/19/16 4. Chest pain: LHC 2017 Nonobstructive.   - No further. Still taking imdur.  Have contacted pharmacy. Will not be in bubble packs next week. Have identified pill for her to remove from packs the rest of the week.  5. HTN - Mildly elevated. Recently increased amlodipine to 10 mg daily.   6. OSA - Continue nightly CPAP.   Chest circumference 110 cm. Pt has been approved for cardiomems. Scheduled for 11/11/16. Risks and benefits re-viewed in office today.    Pt will follow up in office 2-3 weeks s/p procedure on 11/11/16.    Shirley Friar, PA-C  10/29/2016

## 2016-10-29 NOTE — Progress Notes (Signed)
Advanced Heart Failure Medication Review by a Pharmacist  Does the patient  feel that his/her medications are working for him/her?  yes  Has the patient been experiencing any side effects to the medications prescribed?  Yes - HA and nausea with AM meds  Does the patient measure his/her own blood pressure or blood glucose at home?  yes - SBP never above 160  Does the patient have any problems obtaining medications due to transportation or finances?   no  Understanding of regimen: fair Understanding of indications: fair Potential of compliance: good - using bubble pack pharmacy Patient understands to avoid NSAIDs. Patient understands to avoid decongestants.  Issues to address at subsequent visits: None   Pharmacist comments: [name] is a pleasant 71 yo WF presenting to advanced HF clinic with her son. She is complaining of an upset stomach and HA with morning meds, and is still taking Imdur via her list from the pharmacy that bubble packs her meds weekly. She currently has [employer] Part D that covers her meds at a reasonable cost. She endorses good compliance without any other questions or concerns.   Belia Heman, PharmD PGY1 Pharmacy Resident [phone] (Pager) 10/29/2016 10:55 AM  Time with patient: 13 min Preparation and documentation time: 2 min Total time: 15 min

## 2016-11-02 ENCOUNTER — Encounter: Payer: Medicare Other | Admitting: Physical Therapy

## 2016-11-03 ENCOUNTER — Telehealth (HOSPITAL_COMMUNITY): Payer: Self-pay | Admitting: *Deleted

## 2016-11-03 NOTE — Telephone Encounter (Signed)
Patient called in this morning stating she had gained 5 lbs overnight.  She feels it most around her stomach and she is starting to cough a little this morning. She had already taken her morning dose of lasix and stated she takes 2.5 mg Metolazone on Wednesdays and Saturdays.    I advised her to go ahead and take a metolazone today and to call us back late this afternoon or tomorrow morning if she does not see any improvement.  She is agreeable with plan and no further questions at this time.

## 2016-11-04 ENCOUNTER — Other Ambulatory Visit (HOSPITAL_COMMUNITY): Payer: Self-pay | Admitting: Cardiology

## 2016-11-04 ENCOUNTER — Encounter: Payer: Self-pay | Admitting: Physical Therapy

## 2016-11-04 ENCOUNTER — Ambulatory Visit: Payer: Medicare Other | Admitting: Physical Therapy

## 2016-11-04 DIAGNOSIS — M25562 Pain in left knee: Secondary | ICD-10-CM | POA: Diagnosis not present

## 2016-11-04 DIAGNOSIS — G8929 Other chronic pain: Secondary | ICD-10-CM

## 2016-11-04 DIAGNOSIS — M6281 Muscle weakness (generalized): Secondary | ICD-10-CM

## 2016-11-04 DIAGNOSIS — R262 Difficulty in walking, not elsewhere classified: Secondary | ICD-10-CM

## 2016-11-04 NOTE — Therapy (Signed)
[facility] [place], [place], [address] Phone: [phone]   Fax:  [phone]  Physical Therapy Treatment  Patient Details  Name: [name] MRN: [id number] Date of Birth: August 17, 1946 Referring Provider: Earlie Server, MD  Encounter Date: 11/04/2016      PT End of Session - 11/04/16 1022    Visit Number 9   Number of Visits 16   Date for PT Re-Evaluation 12/04/16   PT Start Time 0953   PT Stop Time 1044   PT Time Calculation (min) 51 min   Activity Tolerance Patient tolerated treatment well   Behavior During Therapy [facility] for tasks assessed/performed      Past Medical History:  Diagnosis Date  . Anemia   . Anxiety   . Arthritis   . Brain lesion    2 types  . Cervical spondylosis with myelopathy   . Chest pain    Normal cardiac cath 5/09  . Congestive heart failure (CHF) ([place])   . Depression   . Dumping syndrome   . Edema   . Fatigue   . Fibromyalgia   . History of echocardiogram    a. Echo 03/20/16 (done at [facility] in [place], [place]):  mild LVH, EF 62%, normal diastolic function, mild LAE, MAC, RVSP 25 mmHg  . Hyperlipidemia   . Hypertension   . Hypothyroidism   . Lumbar spondylosis   . Lymphedema    seeing specialist for this  . Restless leg syndrome   . Rotator cuff injury   . Stroke [facility])    TIAs "mini strokes"- unsure of last TIA  . Syncope   . Tremors of nervous system     Past Surgical History:  Procedure Laterality Date  . APPENDECTOMY  1966  . CARDIAC CATHETERIZATION N/A 05/25/2016   Procedure: Right/Left Heart Cath and Coronary Angiography;  Surgeon: Larey Dresser, MD;  Location: [place] CV LAB;  Service: Cardiovascular;  Laterality: N/A;  . CHOLECYSTECTOMY  2015  . CHOLECYSTECTOMY  2015  . CORONARY ANGIOGRAM  2009   Normal coronaries  . Hip Bursa Surgery Bilateral   . KNEE ARTHROSCOPY Bilateral   . LUMBAR DISC SURGERY     x2  . LUMBAR FUSION    . ROTATOR CUFF REPAIR  Bilateral   . TOTAL ABDOMINAL HYSTERECTOMY    . TOTAL KNEE ARTHROPLASTY Left   . TOTAL KNEE ARTHROPLASTY Right 11/18/2012   Procedure: TOTAL KNEE ARTHROPLASTY;  Surgeon: Yvette Rack., MD;  Location: [place];  Service: Orthopedics;  Laterality: Right;    There were no vitals filed for this visit.      Subjective Assessment - 11/04/16 0958    Subjective Patient reported feeling improvement overall yet some ongoing soreness in knee   Pertinent History Bilateral total knee replacements. Bilateral rotator cuff repairs   Limitations Walking   How long can you stand comfortably? 5-10 minutes.   How long can you walk comfortably? < 5 min   Patient Stated Goals Get out of pain, walk better.    Currently in Pain? Yes   Pain Score 4    Pain Location Knee   Pain Orientation Left   Pain Descriptors / Indicators Aching   Pain Type Chronic pain   Pain Onset More than a month ago   Aggravating Factors  prolong activity   Pain Relieving Factors at rest  [place] Adult PT Treatment/Exercise - 11/04/16 0001      Knee/Hip Exercises: Aerobic   Nustep L5 x 19mnutes     Knee/Hip Exercises: Seated   Long Arc Quad Both;10 reps;Weights;3 sets   LIllinois Tool WorksWeight 2 lbs.   Marching Limitations 2x10 with 2# bil   Hamstring Curl Both;2 sets;10 reps   Hamstring Limitations green t-band   Sit to Sand with UE support;10 reps     Moist Heat Therapy   Number Minutes Moist Heat 15 Minutes   Moist Heat Location Knee     Electrical Stimulation   Electrical Stimulation Location les pes anserine, inferior patella tendon   Electrical Stimulation Action premod   Electrical Stimulation Parameters 80-_0  x195m   Electrical Stimulation Goals Pain                  PT Short Term Goals - 10/05/16 1037      PT SHORT TERM GOAL #1   Title STG's=LTG's.           PT Long Term Goals - 10/28/16 1006      PT LONG TERM GOAL #1   Title Independent with a  HEP.   Time 8   Period Weeks   Status On-going     PT LONG TERM GOAL #2   Title Stand 20 minutes with left knee pain not > 3/10.   Baseline pt reporting 4-5/10 with doing dishes and standing for 20 minutes   Time 8   Period Weeks   Status On-going     PT LONG TERM GOAL #3   Title Perform ADL's with pain not > 3/10.   Time 8   Period Weeks   Status On-going     PT LONG TERM GOAL #4   Title Pt to be walking 10 minutes twice a day to promote lymphatic circulation      PT LONG TERM GOAL #5   Period Weeks   Status On-going               Plan - 11/04/16 1029    Clinical Impression Statement Patient tolerated treatment today and was able to perform all exercises with little fatigue. Patient has reported feeling less pain overall yet some low endurance when not consistant with her exercises. Patient progressing toward goals yet ongoing due to pain and strengths deficts.    Rehab Potential Good   PT Frequency 2x / week   PT Duration 8 weeks   PT Treatment/Interventions Iontophoresis 29m110ml Dexamethasone;Moist Heat;Patient/family education;Therapeutic exercise;Therapeutic activities;Manual techniques;Vasopneumatic Device;Dry needling   PT Next Visit Plan cont with POC   Consulted and Agree with Plan of Care Patient      Patient will benefit from skilled therapeutic intervention in order to improve the following deficits and impairments:  Decreased activity tolerance, Decreased strength, Pain, Postural dysfunction, Impaired perceived functional ability, Decreased balance, Decreased mobility  Visit Diagnosis: Muscle weakness (generalized)  Chronic pain of left knee  Difficulty in walking, not elsewhere classified     Problem List Patient Active Problem List   Diagnosis Date Noted  . Unstable angina ([place]/28/2017  . Chronic diastolic CHF (congestive heart failure) ([place]/01/2016  . Normal coronary arteries 2009 01/14/2015  . Obesity-BMI 40 01/14/2015  .  Restless leg 01/14/2015  . Chest pain 01/14/2015  . Back pain 01/14/2015  . Renal insufficiency 01/14/2015  . Dementia- mild memory issues 01/14/2015  . Occult blood positive stool 12/14/2014  . Anemia 12/14/2014  . Family history  of colon cancer 12/14/2014  . Change in bowel habits 12/14/2014  . GERD (gastroesophageal reflux disease) 12/14/2014  . Dyspnea 05/17/2012  . Lymphedema 05/17/2012  . Weight gain 05/17/2012  . HTN (hypertension) 05/17/2012    Keyira Mondesir P, PTA 11/04/2016, 10:46 AM  [facility] [place], [place], [address] Phone: [phone]   Fax:  [phone]  Name: [name] MRN: [id number] Date of Birth: Jul 29, 1946

## 2016-11-05 NOTE — Addendum Note (Signed)
Encounter addended by: Shirley Friar, PA-C on: 11/05/2016  6:30 PM<BR>    Actions taken: LOS modified, Follow-up modified

## 2016-11-09 ENCOUNTER — Other Ambulatory Visit (HOSPITAL_COMMUNITY): Payer: Self-pay | Admitting: *Deleted

## 2016-11-09 ENCOUNTER — Ambulatory Visit: Payer: Medicare Other | Admitting: Physical Therapy

## 2016-11-09 ENCOUNTER — Encounter: Payer: Self-pay | Admitting: Physical Therapy

## 2016-11-09 ENCOUNTER — Telehealth (HOSPITAL_COMMUNITY): Payer: Self-pay | Admitting: *Deleted

## 2016-11-09 DIAGNOSIS — I5032 Chronic diastolic (congestive) heart failure: Secondary | ICD-10-CM

## 2016-11-09 DIAGNOSIS — M6281 Muscle weakness (generalized): Secondary | ICD-10-CM

## 2016-11-09 DIAGNOSIS — M25562 Pain in left knee: Secondary | ICD-10-CM | POA: Diagnosis not present

## 2016-11-09 DIAGNOSIS — G8929 Other chronic pain: Secondary | ICD-10-CM

## 2016-11-09 DIAGNOSIS — R262 Difficulty in walking, not elsewhere classified: Secondary | ICD-10-CM

## 2016-11-09 NOTE — Therapy (Signed)
[facility] [place], [place], [address] Phone: [phone]   Fax:  [phone]  Physical Therapy Treatment  Patient Details  Name: [name] MRN: [id number] Date of Birth: 07/23/1946 Referring Provider: Earlie Server, MD  Encounter Date: 11/09/2016      PT End of Session - 11/09/16 1008    Visit Number 10   Number of Visits 16   Date for PT Re-Evaluation 12/04/16   PT Start Time 0950   PT Stop Time 1035   PT Time Calculation (min) 45 min   Activity Tolerance Patient tolerated treatment well   Behavior During Therapy [facility] for tasks assessed/performed      Past Medical History:  Diagnosis Date  . Anemia   . Anxiety   . Arthritis   . Brain lesion    2 types  . Cervical spondylosis with myelopathy   . Chest pain    Normal cardiac cath 5/09  . Congestive heart failure (CHF) ([place])   . Depression   . Dumping syndrome   . Edema   . Fatigue   . Fibromyalgia   . History of echocardiogram    a. Echo 03/20/16 (done at [facility] in [place], [place]):  mild LVH, EF 81%, normal diastolic function, mild LAE, MAC, RVSP 25 mmHg  . Hyperlipidemia   . Hypertension   . Hypothyroidism   . Lumbar spondylosis   . Lymphedema    seeing specialist for this  . Restless leg syndrome   . Rotator cuff injury   . Stroke [facility])    TIAs "mini strokes"- unsure of last TIA  . Syncope   . Tremors of nervous system     Past Surgical History:  Procedure Laterality Date  . APPENDECTOMY  1966  . CARDIAC CATHETERIZATION N/A 05/25/2016   Procedure: Right/Left Heart Cath and Coronary Angiography;  Surgeon: Larey Dresser, MD;  Location: [place] CV LAB;  Service: Cardiovascular;  Laterality: N/A;  . CHOLECYSTECTOMY  2015  . CHOLECYSTECTOMY  2015  . CORONARY ANGIOGRAM  2009   Normal coronaries  . Hip Bursa Surgery Bilateral   . KNEE ARTHROSCOPY Bilateral   . LUMBAR DISC SURGERY     x2  . LUMBAR FUSION    . ROTATOR CUFF REPAIR  Bilateral   . TOTAL ABDOMINAL HYSTERECTOMY    . TOTAL KNEE ARTHROPLASTY Left   . TOTAL KNEE ARTHROPLASTY Right 11/18/2012   Procedure: TOTAL KNEE ARTHROPLASTY;  Surgeon: Yvette Rack., MD;  Location: [place];  Service: Orthopedics;  Laterality: Right;    There were no vitals filed for this visit.      Subjective Assessment - 11/09/16 1000    Subjective Patient arriving to therapy reporting dizziness after MD started her on a new medicaiton for neuropathy on Thursday. Pt unable to recall the name of the medication and reports she would bring it at her next visit. Pt reported taking the medicaiton last night and still reporting mild dizziness this morning. pt arriving to therapy  5 minutes late this morning and amb with her rollator walker.    Pertinent History Bilateral total knee replacements. Bilateral rotator cuff repairs   Limitations Walking   How long can you stand comfortably? 5-10 minutes.   How long can you walk comfortably? < 5 min   Patient Stated Goals Get out of pain, walk better.    Currently in Pain? Yes   Pain Score 5    Pain Location Knee   Pain  Orientation Left   Pain Descriptors / Indicators Aching   Pain Type Chronic pain   Pain Onset More than a month ago   Pain Frequency Constant   Aggravating Factors  prolonged activity   Pain Relieving Factors at rest   Multiple Pain Sites Yes   Pain Score 5   Pain Location Back   Pain Orientation Lower   Pain Descriptors / Indicators Aching;Throbbing   Pain Type Chronic pain   Pain Onset More than a month ago   Pain Frequency Constant   Aggravating Factors  sitting unsupported, lying flat, walking   Pain Relieving Factors lying reclined, sitting supported, pain meds                         [facility] Adult PT Treatment/Exercise - 11/09/16 0001      Exercises   Exercises Knee/Hip     Knee/Hip Exercises: Aerobic   Nustep L5 x 35mnutes     Knee/Hip Exercises: Supine   Short Arc Quad Sets 10 reps    Short Arc Quad Sets Limitations 10 reps with 3# ankle weight holding 2 seconds each   Heel Slides 10 reps   Straight Leg Raises 10 reps;Limitations   Straight Leg Raises Limitations holding 3 seconds each    Other Supine Knee/Hip Exercises supine marching for lumbar spine x 10 each LE alternating   Other Supine Knee/Hip Exercises clam shells with green theraband x 20 reps     Modalities   Modalities Electrical Stimulation     Moist Heat Therapy   Number Minutes Moist Heat 15 Minutes     Electrical Stimulation   Electrical Stimulation Location les pes anserine, inferior patella tendon   Electrical Stimulation Action IFC   Electrical Stimulation Parameters 80-15- Hx x 15 minutes   Electrical Stimulation Goals Pain                PT Education - 11/09/16 1133    Education provided Yes   Education Details Reviewed supine exercises in reclined position to help with back pain   Person(s) Educated Patient   Methods Explanation;Demonstration   Comprehension Verbalized understanding;Returned demonstration          PT Short Term Goals - 10/05/16 1037      PT SHORT TERM GOAL #1   Title STG's=LTG's.           PT Long Term Goals - 11/09/16 1136      PT LONG TERM GOAL #1   Title Independent with a HEP.   Time 8   Period Weeks   Status Partially Met     PT LONG TERM GOAL #2   Title Stand 20 minutes with left knee pain not > 3/10.   Baseline pt reporting 4-5/10 with doing dishes and standing for 20 minutes   Time 8   Period Weeks   Status On-going     PT LONG TERM GOAL #3   Title Perform ADL's with pain not > 3/10.   Baseline Pt reporting pain vaires with ADL's from 3-8/10.    Time 8   Period Weeks   Status On-going     PT LONG TERM GOAL #4   Title Pt to be walking 10 minutes twice a day to promote lymphatic circulation    Time 6   Period Weeks   Status On-going     PT LONG TERM GOAL #5   Title Pt to state she is able to walk for  10 minutes without  difficulty ( pt limited to 10 minutes due to back issues)   Time 8   Period Weeks   Status On-going               Plan - 11/09/16 1134    Clinical Impression Statement patient tolerated treatment better today than last treatment. pt able to perform supine LE strengtheing exercises with added ankle weight. At end of session pt reporting 3/10 left medial knee pain. Continue with skilled PT to progress toward goals set.    Rehab Potential Good   PT Frequency 2x / week   PT Duration 8 weeks   PT Treatment/Interventions Iontophoresis 55m/ml Dexamethasone;Moist Heat;Patient/family education;Therapeutic exercise;Therapeutic activities;Manual techniques;Vasopneumatic Device;Dry needling   PT Next Visit Plan Sitting supported LE strengthening exercises if pt tolerates, progress supine LE strengthening exercises with incresaed ankle weights   PT Home Exercise Plan Instructed in TENS use, continue SLR, sit to stand, supine marching, LAQ   Consulted and Agree with Plan of Care Patient      Patient will benefit from skilled therapeutic intervention in order to improve the following deficits and impairments:  Decreased activity tolerance, Decreased strength, Pain, Postural dysfunction, Impaired perceived functional ability, Decreased balance, Decreased mobility  Visit Diagnosis: Muscle weakness (generalized)  Chronic pain of left knee  Difficulty in walking, not elsewhere classified  Difficulty walking     Problem List Patient Active Problem List   Diagnosis Date Noted  . Unstable angina ([place] 05/21/2016  . Chronic diastolic CHF (congestive heart failure) ([place] 03/29/2016  . Normal coronary arteries 2009 01/14/2015  . Obesity-BMI 40 01/14/2015  . Restless leg 01/14/2015  . Chest pain 01/14/2015  . Back pain 01/14/2015  . Renal insufficiency 01/14/2015  . Dementia- mild memory issues 01/14/2015  . Occult blood positive stool 12/14/2014  . Anemia 12/14/2014  . Family history of  colon cancer 12/14/2014  . Change in bowel habits 12/14/2014  . GERD (gastroesophageal reflux disease) 12/14/2014  . Dyspnea 05/17/2012  . Lymphedema 05/17/2012  . Weight gain 05/17/2012  . HTN (hypertension) 05/17/2012    JOretha Caprice MPT 11/09/2016, 11:43 AM  [facility] [address]Phone: [phone]  Fax:  [phone] Name: [name] KERCEMRN: [id number] of Birth: [birth year]

## 2016-11-09 NOTE — Telephone Encounter (Signed)
Pt called c/o wt gain and edema.  She states she is not sure how much her wt is up but her LE are very swollen.  Discussed w/Dr Aundra Dubin, he would like to see her in the office tomorrow for ? IV lasix.  Pt aware and appt sch for 12 pm.

## 2016-11-10 ENCOUNTER — Ambulatory Visit (HOSPITAL_COMMUNITY)
Admission: RE | Admit: 2016-11-10 | Discharge: 2016-11-10 | Disposition: A | Discharge status: Home / Self Care | Payer: Medicare Other | Source: Ambulatory Visit | Attending: Cardiology | Admitting: Cardiology

## 2016-11-10 ENCOUNTER — Encounter (HOSPITAL_COMMUNITY): Payer: Self-pay

## 2016-11-10 VITALS — BP 133/61 | HR 83 | Wt 266.5 lb

## 2016-11-10 DIAGNOSIS — R079 Chest pain, unspecified: Secondary | ICD-10-CM | POA: Insufficient documentation

## 2016-11-10 DIAGNOSIS — F0390 Unspecified dementia without behavioral disturbance: Secondary | ICD-10-CM | POA: Insufficient documentation

## 2016-11-10 DIAGNOSIS — I5032 Chronic diastolic (congestive) heart failure: Secondary | ICD-10-CM | POA: Diagnosis not present

## 2016-11-10 DIAGNOSIS — Z7982 Long term (current) use of aspirin: Secondary | ICD-10-CM | POA: Diagnosis not present

## 2016-11-10 DIAGNOSIS — I890 Lymphedema, not elsewhere classified: Secondary | ICD-10-CM | POA: Insufficient documentation

## 2016-11-10 DIAGNOSIS — Z79899 Other long term (current) drug therapy: Secondary | ICD-10-CM | POA: Diagnosis not present

## 2016-11-10 DIAGNOSIS — M797 Fibromyalgia: Secondary | ICD-10-CM | POA: Diagnosis not present

## 2016-11-10 DIAGNOSIS — N183 Chronic kidney disease, stage 3 unspecified: Secondary | ICD-10-CM

## 2016-11-10 DIAGNOSIS — I10 Essential (primary) hypertension: Secondary | ICD-10-CM | POA: Diagnosis not present

## 2016-11-10 DIAGNOSIS — I130 Hypertensive heart and chronic kidney disease with heart failure and stage 1 through stage 4 chronic kidney disease, or unspecified chronic kidney disease: Secondary | ICD-10-CM | POA: Insufficient documentation

## 2016-11-10 DIAGNOSIS — G4733 Obstructive sleep apnea (adult) (pediatric): Secondary | ICD-10-CM | POA: Diagnosis not present

## 2016-11-10 DIAGNOSIS — Z885 Allergy status to narcotic agent status: Secondary | ICD-10-CM | POA: Diagnosis not present

## 2016-11-10 DIAGNOSIS — Z9989 Dependence on other enabling machines and devices: Secondary | ICD-10-CM | POA: Diagnosis not present

## 2016-11-10 LAB — BASIC METABOLIC PANEL
Anion gap: 13 (ref 5–15)
BUN: 69 mg/dL — ABNORMAL HIGH (ref 6–20)
CALCIUM: 9.9 mg/dL (ref 8.9–10.3)
CO2: 29 mmol/L (ref 22–32)
CREATININE: 2.08 mg/dL — AB (ref 0.44–1.00)
Chloride: 94 mmol/L — ABNORMAL LOW (ref 101–111)
GFR calc non Af Amer: 23 mL/min — ABNORMAL LOW (ref >60)
GFR, EST AFRICAN AMERICAN: 26 mL/min — AB (ref >60)
Glucose, Bld: 133 mg/dL — ABNORMAL HIGH (ref 65–99)
Potassium: 4.3 mmol/L (ref 3.5–5.1)
SODIUM: 136 mmol/L (ref 135–145)

## 2016-11-10 LAB — CBC
HEMATOCRIT: 35.3 % — AB (ref 36.0–46.0)
HEMOGLOBIN: 11.4 g/dL — AB (ref 12.0–15.0)
MCH: 30 pg (ref 26.0–34.0)
MCHC: 32.3 g/dL (ref 30.0–36.0)
MCV: 92.9 fL (ref 78.0–100.0)
Platelets: 224 10*3/uL (ref 150–400)
RBC: 3.8 MIL/uL — ABNORMAL LOW (ref 3.87–5.11)
RDW: 15.1 % (ref 11.5–15.5)
WBC: 9.4 10*3/uL (ref 4.0–10.5)

## 2016-11-10 LAB — PROTIME-INR
INR: 0.99
Prothrombin Time: 13.1 seconds (ref 11.4–15.2)

## 2016-11-10 MED ORDER — FUROSEMIDE 10 MG/ML IJ SOLN
80.0000 mg | Freq: Once | INTRAMUSCULAR | Status: AC
  Administered 2016-11-10: 80 mg via INTRAVENOUS
  Filled 2016-11-10: qty 8

## 2016-11-10 MED ORDER — TORSEMIDE 20 MG PO TABS: 80.0000 mg | ORAL_TABLET | Freq: Every day | ORAL | 3 refills | Status: DC

## 2016-11-10 MED ORDER — POTASSIUM CHLORIDE CRYS ER 20 MEQ PO TBCR
40.0000 meq | EXTENDED_RELEASE_TABLET | Freq: Once | ORAL | Status: AC
  Administered 2016-11-10: 20 meq via ORAL
  Filled 2016-11-10: qty 2

## 2016-11-10 NOTE — Progress Notes (Signed)
Patient voided 300 mls and requested to go home.  Dr. Aundra Dubin said that was fine.  IV removed prior to checking. Catheter intact.

## 2016-11-10 NOTE — Patient Instructions (Signed)
Labs today (will call for abnormal results, otherwise no news is good news)  STOP Lasix  START taking Torsemide 80 mg (4 Tablets) Once Daily, Start taking tomorrow November 11, 2016.  Labs in 1 Week (BMET)  Follow up in 1 Week

## 2016-11-10 NOTE — Progress Notes (Signed)
PIV x 1 attempt inserted to LPFA 24 g. Patient tolerated well, 80 mg IV lasix pushed, 20 meq PO potassium given per Dr. Aundra Dubin order. Patient in CHF clinic room 3 with family at bedside and call bell and BSC in reach. Will continue to monitor closely.  Total UOP:300 cc clear yellow urine  PIV removed before patient DC'd from clinic appointment.  Renee Pain, RN

## 2016-11-11 ENCOUNTER — Ambulatory Visit (HOSPITAL_COMMUNITY): Admission: RE | Admit: 2016-11-11 | Payer: Medicare Other | Source: Ambulatory Visit | Admitting: Cardiology

## 2016-11-11 ENCOUNTER — Telehealth: Payer: Self-pay | Admitting: Internal Medicine

## 2016-11-11 NOTE — Progress Notes (Signed)
Advanced Heart Failure Clinic Note   Primary Care: Dr. Edrick Oh Primary Cardiologist: Dr. Angelena Form Renal: Dr Joelyn Oms HF Cardiology: Dr. Aundra Dubin  HPI: [name] is a 71 y.o. with history of chronic diastolic CHF (Echo 1/69/67 LVEF 65%, Mild LAE), chronic lymphedema, chronic back pain, HTN, CKD stage 3, and fibromyalgia.    Admitted several times in 02/2016 to [facility] with CHF and chest pain.  Has been tried on spironolactone + metolazone alone as diuretic regimen. Follows with renal as above.   Seen in [facility] clinic 04/16/16. Thought to have mild volume overload. Lasix dose increased to 40 mg daily.  She was admitted in 9/17 with chest pain and AKI.  She had seen nephrology and was given metolazone to take daily along with Lasix, creatinine rose considerably.  Lasix was decreased to 40 mg bid.  When creatinine improved, she had right and left heart cath showing nonobstructive CAD and near-normal filling pressures.  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    She returns for HF follow up. Volume is very difficult to discern given chronic lymphedema. Her weight is up today by about 12 lbs though she has been compliant with her meds.  She is short of breath walking around her house, this is worse than in the past.  Lower extremity edema is worse.  She has developed a cough.  No chest pain.  Stable mild orthopnea.                                                                                                                                                                     Labs (9/17): K 4.6, creatinine 1.55, BNP 63 Labs (10/17): K 4.7, creatinine 1.8 Labs ( 09/06/2016): K 3.7 Creatinine 2.05  Labs (1/18): K 5.1, creatinine 2.03 Labs (2/18): hgb 12, K 4.3, creatinine 1.87  ROS: All systems reviewed and negative except as per HPI.  Past Medical History 1. Chronic diastolic CHF - Echo 8/93/81 LVEF 65%, Mild LAE - [facility] (10/17): mean RA 4, PA 25/11, mean  PCWP 5, CI 2.4.  - Echo (2/18): EF 55-60%, mild LVH, normal RV size and systolic function. 2. CKD stage 3: Follows with nephology. Baseline appears to be creatinine 1.4-1.6.  3. Chronic lymphedema: Has had wraps.  4. HTN 5. Dementia 6. Fibromyalgia 7. Microvascular angina:  - Normal coronaries on Cath 2009 - Stress Myoview 04/09/16 with no ischemia - LHC (10/17) with mild nonobstructive disease.   FH: Mother with CHF, brother with "heart disease."  Another brother with h/o MI.   Current Outpatient Prescriptions  Medication Sig Dispense Refill  . alendronate (FOSAMAX) 70 MG tablet Take 70 mg by mouth every Friday. Take with a full glass of water on an empty  stomach.     Marland Kitchen amLODipine (NORVASC) 10 MG tablet Take 1 tablet (10 mg total) by mouth daily. 30 tablet 6  . aspirin 81 MG tablet Take 81 mg by mouth at bedtime.    . baclofen (LIORESAL) 10 MG tablet Take 10 mg by mouth 3 (three) times daily.    Marland Kitchen donepezil (ARICEPT) 10 MG tablet Take 10 mg by mouth at bedtime.     . DULoxetine (CYMBALTA) 20 MG capsule Take 1 capsule by mouth daily.    Marland Kitchen escitalopram (LEXAPRO) 20 MG tablet Take 30 mg by mouth daily with supper.     . fentaNYL (DURAGESIC - DOSED MCG/HR) 25 MCG/HR patch Place 25 mcg onto the skin every 3 (three) days.     . folic acid (FOLVITE) 1 MG tablet Take 1 mg by mouth 2 (two) times daily.    Marland Kitchen gabapentin (NEURONTIN) 300 MG capsule Take 1 capsule by mouth 3 (three) times daily.    Marland Kitchen HYDROcodone-acetaminophen (NORCO) 7.5-325 MG tablet Take 1 tablet by mouth every 6 (six) hours as needed for moderate pain.    . isosorbide mononitrate (IMDUR) 30 MG 24 hr tablet Take 30 mg by mouth daily.    Marland Kitchen lamoTRIgine (LAMICTAL) 100 MG tablet Take 100 mg by mouth 2 (two) times daily.     Marland Kitchen levothyroxine (SYNTHROID) 75 MCG tablet Take 75 mcg by mouth daily before breakfast.     . memantine (NAMENDA) 10 MG tablet Take 10 mg by mouth 2 (two) times daily.    . metolazone (ZAROXOLYN) 2.5 MG tablet Take  1 tablet (2.5 mg total) by mouth 2 (two) times a week. Every Tuesday and Saturday 15 tablet 6  . pravastatin (PRAVACHOL) 80 MG tablet Take 80 mg by mouth at bedtime.     . ranitidine (ZANTAC) 150 MG tablet Take 150 mg by mouth 2 (two) times daily.    Marland Kitchen rOPINIRole (REQUIP) 1 MG tablet Take 1 mg by mouth 3 (three) times daily.    . sennosides-docusate sodium (SENOKOT-S) 8.6-50 MG tablet Take 2 tablets by mouth at bedtime.    Marland Kitchen spironolactone (ALDACTONE) 25 MG tablet Take 25 mg by mouth 2 (two) times daily.    Marland Kitchen torsemide (DEMADEX) 20 MG tablet Take 4 tablets (80 mg total) by mouth daily. 360 tablet 3   No current facility-administered medications for this encounter.     Allergies  Allergen Reactions  . Oxycodone Hives, Itching and Other (See Comments)    Other reaction(s): Delusions (intolerance)  . Hydromorphone Other (See Comments)    Hallucinations, altered mental state      Social History   Social History  . Marital status: Widowed    Spouse name: N/A  . Number of children: 3  . Years of education: N/A   Occupational History  . retired    Social History Main Topics  . Smoking status: Never Smoker  . Smokeless tobacco: Never Used  . Alcohol use No  . Drug use: No  . Sexual activity: Not Currently   Other Topics Concern  . Not on file   Social History Narrative   Pt does use caffeine. Lives alone. 3 children. Retired.    Vitals:   11/10/16 1151  BP: 133/61  Pulse: 83  SpO2: 94%  Weight: 266 lb 8 oz (120.9 kg)   Wt Readings from Last 3 Encounters:  11/10/16 266 lb 8 oz (120.9 kg)  10/29/16 254 lb (115.2 kg)  10/20/16 262 lb 3.2 oz (118.9 kg)  PHYSICAL EXAM: General: NAD.  Neck: Thick, JVP difficult estimate 8-9 cm, no thyromegaly or thyroid nodule.  Lungs: Mild basilar crackles. CV: Nondisplaced PMI.  Heart regular S1/S2, no S3/S4, no murmur.  No carotid bruit.  Normal pedal pulses.  Abdomen: Soft, nontender, no hepatosplenomegaly, no distention.    Skin: Intact without lesions or rashes. Warm Neurologic: Alert and oriented x 3.  Psych: Normal affect. Extremities: No clubbing or cyanosis. Bilateral lymphedema.  HEENT: Normal.   ASSESSMENT & PLAN: 1. Chronic diastolic CHF:  NYHA class IIIb symptoms, worse recently.  Echo (2/18) with EF 55-60%, normal RV.   Weight is up 12 lbs.  Exam is made very difficult by her chronic lymphedema.  Suspect significant volume overload.   - I will give Lasix 80 mg IV x 1 in the office today, will give KCl 20 x1. - Stop Lasix, start torsemide 80 mg daily tomorrow.  Continue metolazone twice weekly. BMET in 1 week.  - We are planning to implant a Cardiomems device tomorrow.  I answered all her questions about this.    2. Lymphedema: Uses thigh-high compression stockings.   3. CKD: Stage III.  BMET in 1 week.  4. Chest pain: LHC 2017 Nonobstructive.  Continue Imdur.      She will need to followup in 1 week.   Loralie Champagne 11/11/2016

## 2016-11-13 ENCOUNTER — Encounter (HOSPITAL_COMMUNITY)
Admission: RE | Disposition: A | Discharge status: Home / Self Care | Payer: Self-pay | Source: Ambulatory Visit | Attending: Cardiology

## 2016-11-13 ENCOUNTER — Other Ambulatory Visit: Payer: Self-pay

## 2016-11-13 ENCOUNTER — Ambulatory Visit (HOSPITAL_COMMUNITY)
Admission: RE | Admit: 2016-11-13 | Discharge: 2016-11-13 | Disposition: A | Discharge status: Home / Self Care | Payer: Medicare Other | Source: Ambulatory Visit | Attending: Cardiology | Admitting: Cardiology

## 2016-11-13 ENCOUNTER — Encounter (HOSPITAL_COMMUNITY): Payer: Self-pay | Admitting: Cardiology

## 2016-11-13 ENCOUNTER — Encounter (HOSPITAL_COMMUNITY): Admission: RE | Payer: Self-pay | Source: Ambulatory Visit

## 2016-11-13 ENCOUNTER — Encounter: Payer: Medicare Other | Admitting: Physical Therapy

## 2016-11-13 DIAGNOSIS — N183 Chronic kidney disease, stage 3 (moderate): Secondary | ICD-10-CM | POA: Insufficient documentation

## 2016-11-13 DIAGNOSIS — I5032 Chronic diastolic (congestive) heart failure: Secondary | ICD-10-CM | POA: Diagnosis not present

## 2016-11-13 DIAGNOSIS — I2510 Atherosclerotic heart disease of native coronary artery without angina pectoris: Secondary | ICD-10-CM | POA: Diagnosis not present

## 2016-11-13 DIAGNOSIS — M797 Fibromyalgia: Secondary | ICD-10-CM | POA: Diagnosis not present

## 2016-11-13 DIAGNOSIS — Z885 Allergy status to narcotic agent status: Secondary | ICD-10-CM | POA: Diagnosis not present

## 2016-11-13 DIAGNOSIS — Z7982 Long term (current) use of aspirin: Secondary | ICD-10-CM | POA: Diagnosis not present

## 2016-11-13 DIAGNOSIS — M549 Dorsalgia, unspecified: Secondary | ICD-10-CM | POA: Insufficient documentation

## 2016-11-13 DIAGNOSIS — I890 Lymphedema, not elsewhere classified: Secondary | ICD-10-CM | POA: Diagnosis not present

## 2016-11-13 DIAGNOSIS — I130 Hypertensive heart and chronic kidney disease with heart failure and stage 1 through stage 4 chronic kidney disease, or unspecified chronic kidney disease: Secondary | ICD-10-CM | POA: Insufficient documentation

## 2016-11-13 DIAGNOSIS — F0390 Unspecified dementia without behavioral disturbance: Secondary | ICD-10-CM | POA: Insufficient documentation

## 2016-11-13 DIAGNOSIS — Z8249 Family history of ischemic heart disease and other diseases of the circulatory system: Secondary | ICD-10-CM | POA: Diagnosis not present

## 2016-11-13 DIAGNOSIS — G8929 Other chronic pain: Secondary | ICD-10-CM | POA: Diagnosis not present

## 2016-11-13 DIAGNOSIS — R079 Chest pain, unspecified: Secondary | ICD-10-CM | POA: Insufficient documentation

## 2016-11-13 HISTORY — PX: RIGHT HEART CATH: CATH118263

## 2016-11-13 LAB — POCT I-STAT 3, VENOUS BLOOD GAS (G3P V)
Acid-Base Excess: 4 mmol/L — ABNORMAL HIGH (ref 0.0–2.0)
Acid-Base Excess: 6 mmol/L — ABNORMAL HIGH (ref 0.0–2.0)
BICARBONATE: 29.7 mmol/L — AB (ref 20.0–28.0)
Bicarbonate: 32 mmol/L — ABNORMAL HIGH (ref 20.0–28.0)
O2 SAT: 74 %
O2 Saturation: 77 %
PCO2 VEN: 47.5 mmHg (ref 44.0–60.0)
PCO2 VEN: 51.8 mmHg (ref 44.0–60.0)
PH VEN: 7.398 (ref 7.250–7.430)
PH VEN: 7.404 (ref 7.250–7.430)
PO2 VEN: 40 mmHg (ref 32.0–45.0)
PO2 VEN: 42 mmHg (ref 32.0–45.0)
TCO2: 31 mmol/L (ref 0–100)
TCO2: 34 mmol/L (ref 0–100)

## 2016-11-13 LAB — BASIC METABOLIC PANEL
ANION GAP: 11 (ref 5–15)
BUN: 58 mg/dL — ABNORMAL HIGH (ref 6–20)
CALCIUM: 9.4 mg/dL (ref 8.9–10.3)
CO2: 30 mmol/L (ref 22–32)
CREATININE: 2.12 mg/dL — AB (ref 0.44–1.00)
Chloride: 98 mmol/L — ABNORMAL LOW (ref 101–111)
GFR calc non Af Amer: 22 mL/min — ABNORMAL LOW (ref >60)
GFR, EST AFRICAN AMERICAN: 26 mL/min — AB (ref >60)
Glucose, Bld: 98 mg/dL (ref 65–99)
Potassium: 3.8 mmol/L (ref 3.5–5.1)
Sodium: 139 mmol/L (ref 135–145)

## 2016-11-13 SURGERY — RIGHT HEART CATH
Anesthesia: LOCAL

## 2016-11-13 MED ORDER — LIDOCAINE HCL (PF) 1 % IJ SOLN
INTRAMUSCULAR | Status: DC | PRN
  Administered 2016-11-13: 15 mL

## 2016-11-13 MED ORDER — SODIUM CHLORIDE 0.9 % IV SOLN: 250.0000 mL | INTRAVENOUS | Status: DC | PRN

## 2016-11-13 MED ORDER — ASPIRIN 81 MG PO CHEW
CHEWABLE_TABLET | ORAL | Status: AC
  Administered 2016-11-13: 07:00:00
  Filled 2016-11-13: qty 1

## 2016-11-13 MED ORDER — ONDANSETRON HCL 4 MG/2ML IJ SOLN: 4.0000 mg | Freq: Four times a day (QID) | INTRAMUSCULAR | Status: DC | PRN

## 2016-11-13 MED ORDER — LIDOCAINE HCL (PF) 1 % IJ SOLN
INTRAMUSCULAR | Status: AC
Start: 2016-11-13 — End: ?
  Filled 2016-11-13: qty 30

## 2016-11-13 MED ORDER — MIDAZOLAM HCL 2 MG/2ML IJ SOLN
INTRAMUSCULAR | Status: AC
  Filled 2016-11-13: qty 2

## 2016-11-13 MED ORDER — OXYCODONE-ACETAMINOPHEN 5-325 MG PO TABS
1.0000 | ORAL_TABLET | ORAL | Status: DC | PRN
  Administered 2016-11-13: 2 via ORAL

## 2016-11-13 MED ORDER — IOPAMIDOL (ISOVUE-370) INJECTION 76%
INTRAVENOUS | Status: AC
  Filled 2016-11-13: qty 50

## 2016-11-13 MED ORDER — IOPAMIDOL (ISOVUE-370) INJECTION 76%
INTRAVENOUS | Status: DC | PRN
  Administered 2016-11-13: 20 mL via INTRA_ARTERIAL

## 2016-11-13 MED ORDER — SODIUM CHLORIDE 0.9 % IV SOLN: INTRAVENOUS | Status: DC

## 2016-11-13 MED ORDER — OXYCODONE-ACETAMINOPHEN 5-325 MG PO TABS
ORAL_TABLET | ORAL | Status: AC
  Administered 2016-11-13: 2 via ORAL
  Filled 2016-11-13: qty 2

## 2016-11-13 MED ORDER — MIDAZOLAM HCL 2 MG/2ML IJ SOLN
INTRAMUSCULAR | Status: DC | PRN
  Administered 2016-11-13 (×3): 1 mg via INTRAVENOUS

## 2016-11-13 MED ORDER — SODIUM CHLORIDE 0.9% FLUSH: 3.0000 mL | Freq: Two times a day (BID) | INTRAVENOUS | Status: DC

## 2016-11-13 MED ORDER — ASPIRIN 81 MG PO CHEW: 81.0000 mg | CHEWABLE_TABLET | ORAL | Status: DC

## 2016-11-13 MED ORDER — FENTANYL CITRATE (PF) 100 MCG/2ML IJ SOLN
INTRAMUSCULAR | Status: AC
  Filled 2016-11-13: qty 2

## 2016-11-13 MED ORDER — SODIUM CHLORIDE 0.9% FLUSH: 3.0000 mL | INTRAVENOUS | Status: DC | PRN

## 2016-11-13 MED ORDER — MORPHINE SULFATE (PF) 4 MG/ML IV SOLN
2.0000 mg | INTRAVENOUS | Status: DC | PRN
  Administered 2016-11-13: 2 mg via INTRAVENOUS

## 2016-11-13 MED ORDER — HEPARIN (PORCINE) IN NACL 2-0.9 UNIT/ML-% IJ SOLN
INTRAMUSCULAR | Status: AC
  Filled 2016-11-13: qty 1000

## 2016-11-13 MED ORDER — HEPARIN (PORCINE) IN NACL 2-0.9 UNIT/ML-% IJ SOLN
INTRAMUSCULAR | Status: DC | PRN
  Administered 2016-11-13: 1000 mL

## 2016-11-13 MED ORDER — MORPHINE SULFATE (PF) 4 MG/ML IV SOLN
INTRAVENOUS | Status: AC
  Filled 2016-11-13: qty 1

## 2016-11-13 MED ORDER — FENTANYL CITRATE (PF) 100 MCG/2ML IJ SOLN
INTRAMUSCULAR | Status: DC | PRN
  Administered 2016-11-13 (×3): 25 ug via INTRAVENOUS

## 2016-11-13 MED ORDER — ACETAMINOPHEN 325 MG PO TABS: 650.0000 mg | ORAL_TABLET | ORAL | Status: DC | PRN

## 2016-11-13 SURGICAL SUPPLY — 10 items
CARDIOMEMS PA SENSOR W/DELIVER (Prosthesis & Implant Heart) ×2 IMPLANT
CATH SWAN GANZ 7F STRAIGHT (CATHETERS) ×1 IMPLANT
KIT HEART LEFT (KITS) ×2 IMPLANT
PACK CARDIAC CATHETERIZATION (CUSTOM PROCEDURE TRAY) ×2 IMPLANT
SENSOR CARDIOMEMS PA W/DELIVER (Prosthesis & Implant Heart) IMPLANT
SHEATH FAST CATH 12F 12CM (SHEATH) ×1 IMPLANT
SHEATH PINNACLE 7F 10CM (SHEATH) ×1 IMPLANT
SHEATH PINNACLE 9F 10CM (SHEATH) ×1 IMPLANT
TRANSDUCER W/STOPCOCK (MISCELLANEOUS) ×2 IMPLANT
WIRE G V18X300CM (WIRE) ×1 IMPLANT

## 2016-11-13 NOTE — Progress Notes (Signed)
Unable to get a right or left antecubital IV.  Pt stated that she had the same difficulty with last visit.  States that procedure is going to be done via right groin.  2 IV sites obtained.  Cath lab notified.

## 2016-11-13 NOTE — Interval H&P Note (Signed)
History and Physical Interval Note:  11/13/2016 8:01 AM  [name]  has presented today for surgery, with the diagnosis of HF  The various methods of treatment have been discussed with the patient and family. After consideration of risks, benefits and other options for treatment, the patient has consented to  Procedure(s): Right Heart Cath with Cardiomems (N/A) as a surgical intervention .  The patient's history has been reviewed, patient examined, no change in status, stable for surgery.  I have reviewed the patient's chart and labs.  Questions were answered to the patient's satisfaction.     Cira Deyoe [employer]

## 2016-11-13 NOTE — H&P (View-Only) (Signed)
Advanced Heart Failure Clinic Note   Primary Care: Dr. Edrick Oh Primary Cardiologist: Dr. Angelena Form Renal: Dr Joelyn Oms HF Cardiology: Dr. Aundra Dubin  HPI: [name] is a 71 y.o. with history of chronic diastolic CHF (Echo 09/20/76 LVEF 65%, Mild LAE), chronic lymphedema, chronic back pain, HTN, CKD stage 3, and fibromyalgia.    Admitted several times in 02/2016 to [facility] with CHF and chest pain.  Has been tried on spironolactone + metolazone alone as diuretic regimen. Follows with renal as above.   Seen in [facility] clinic 04/16/16. Thought to have mild volume overload. Lasix dose increased to 40 mg daily.  She was admitted in 9/17 with chest pain and AKI.  She had seen nephrology and was given metolazone to take daily along with Lasix, creatinine rose considerably.  Lasix was decreased to 40 mg bid.  When creatinine improved, she had right and left heart cath showing nonobstructive CAD and near-normal filling pressures.  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    She returns for HF follow up. Volume is very difficult to discern given chronic lymphedema. Her weight is up today by about 12 lbs though she has been compliant with her meds.  She is short of breath walking around her house, this is worse than in the past.  Lower extremity edema is worse.  She has developed a cough.  No chest pain.  Stable mild orthopnea.                                                                                                                                                                     Labs (9/17): K 4.6, creatinine 1.55, BNP 63 Labs (10/17): K 4.7, creatinine 1.8 Labs ( 09/06/2016): K 3.7 Creatinine 2.05  Labs (1/18): K 5.1, creatinine 2.03 Labs (2/18): hgb 12, K 4.3, creatinine 1.87  ROS: All systems reviewed and negative except as per HPI.  Past Medical History 1. Chronic diastolic CHF - Echo 6/76/72 LVEF 65%, Mild LAE - RHC (10/17): mean RA 4, PA 25/11, mean  PCWP 5, CI 2.4.  - Echo (2/18): EF 55-60%, mild LVH, normal RV size and systolic function. 2. CKD stage 3: Follows with nephology. Baseline appears to be creatinine 1.4-1.6.  3. Chronic lymphedema: Has had wraps.  4. HTN 5. Dementia 6. Fibromyalgia 7. Microvascular angina:  - Normal coronaries on Cath 2009 - Stress Myoview 04/09/16 with no ischemia - LHC (10/17) with mild nonobstructive disease.   FH: Mother with CHF, brother with "heart disease."  Another brother with h/o MI.   Current Outpatient Prescriptions  Medication Sig Dispense Refill  . alendronate (FOSAMAX) 70 MG tablet Take 70 mg by mouth every Friday. Take with a full glass of water on an empty  stomach.     Marland Kitchen amLODipine (NORVASC) 10 MG tablet Take 1 tablet (10 mg total) by mouth daily. 30 tablet 6  . aspirin 81 MG tablet Take 81 mg by mouth at bedtime.    . baclofen (LIORESAL) 10 MG tablet Take 10 mg by mouth 3 (three) times daily.    Marland Kitchen donepezil (ARICEPT) 10 MG tablet Take 10 mg by mouth at bedtime.     . DULoxetine (CYMBALTA) 20 MG capsule Take 1 capsule by mouth daily.    Marland Kitchen escitalopram (LEXAPRO) 20 MG tablet Take 30 mg by mouth daily with supper.     . fentaNYL (DURAGESIC - DOSED MCG/HR) 25 MCG/HR patch Place 25 mcg onto the skin every 3 (three) days.     . folic acid (FOLVITE) 1 MG tablet Take 1 mg by mouth 2 (two) times daily.    Marland Kitchen gabapentin (NEURONTIN) 300 MG capsule Take 1 capsule by mouth 3 (three) times daily.    Marland Kitchen HYDROcodone-acetaminophen (NORCO) 7.5-325 MG tablet Take 1 tablet by mouth every 6 (six) hours as needed for moderate pain.    . isosorbide mononitrate (IMDUR) 30 MG 24 hr tablet Take 30 mg by mouth daily.    Marland Kitchen lamoTRIgine (LAMICTAL) 100 MG tablet Take 100 mg by mouth 2 (two) times daily.     Marland Kitchen levothyroxine (SYNTHROID) 75 MCG tablet Take 75 mcg by mouth daily before breakfast.     . memantine (NAMENDA) 10 MG tablet Take 10 mg by mouth 2 (two) times daily.    . metolazone (ZAROXOLYN) 2.5 MG tablet Take  1 tablet (2.5 mg total) by mouth 2 (two) times a week. Every Tuesday and Saturday 15 tablet 6  . pravastatin (PRAVACHOL) 80 MG tablet Take 80 mg by mouth at bedtime.     . ranitidine (ZANTAC) 150 MG tablet Take 150 mg by mouth 2 (two) times daily.    Marland Kitchen rOPINIRole (REQUIP) 1 MG tablet Take 1 mg by mouth 3 (three) times daily.    . sennosides-docusate sodium (SENOKOT-S) 8.6-50 MG tablet Take 2 tablets by mouth at bedtime.    Marland Kitchen spironolactone (ALDACTONE) 25 MG tablet Take 25 mg by mouth 2 (two) times daily.    Marland Kitchen torsemide (DEMADEX) 20 MG tablet Take 4 tablets (80 mg total) by mouth daily. 360 tablet 3   No current facility-administered medications for this encounter.     Allergies  Allergen Reactions  . Oxycodone Hives, Itching and Other (See Comments)    Other reaction(s): Delusions (intolerance)  . Hydromorphone Other (See Comments)    Hallucinations, altered mental state      Social History   Social History  . Marital status: Widowed    Spouse name: N/A  . Number of children: 3  . Years of education: N/A   Occupational History  . retired    Social History Main Topics  . Smoking status: Never Smoker  . Smokeless tobacco: Never Used  . Alcohol use No  . Drug use: No  . Sexual activity: Not Currently   Other Topics Concern  . Not on file   Social History Narrative   Pt does use caffeine. Lives alone. 3 children. Retired.    Vitals:   11/10/16 1151  BP: 133/61  Pulse: 83  SpO2: 94%  Weight: 266 lb 8 oz (120.9 kg)   Wt Readings from Last 3 Encounters:  11/10/16 266 lb 8 oz (120.9 kg)  10/29/16 254 lb (115.2 kg)  10/20/16 262 lb 3.2 oz (118.9 kg)  PHYSICAL EXAM: General: NAD.  Neck: Thick, JVP difficult estimate 8-9 cm, no thyromegaly or thyroid nodule.  Lungs: Mild basilar crackles. CV: Nondisplaced PMI.  Heart regular S1/S2, no S3/S4, no murmur.  No carotid bruit.  Normal pedal pulses.  Abdomen: Soft, nontender, no hepatosplenomegaly, no distention.    Skin: Intact without lesions or rashes. Warm Neurologic: Alert and oriented x 3.  Psych: Normal affect. Extremities: No clubbing or cyanosis. Bilateral lymphedema.  HEENT: Normal.   ASSESSMENT & PLAN: 1. Chronic diastolic CHF:  NYHA class IIIb symptoms, worse recently.  Echo (2/18) with EF 55-60%, normal RV.   Weight is up 12 lbs.  Exam is made very difficult by her chronic lymphedema.  Suspect significant volume overload.   - I will give Lasix 80 mg IV x 1 in the office today, will give KCl 20 x1. - Stop Lasix, start torsemide 80 mg daily tomorrow.  Continue metolazone twice weekly. BMET in 1 week.  - We are planning to implant a Cardiomems device tomorrow.  I answered all her questions about this.    2. Lymphedema: Uses thigh-high compression stockings.   3. CKD: Stage III.  BMET in 1 week.  4. Chest pain: LHC 2017 Nonobstructive.  Continue Imdur.      She will need to followup in 1 week.   Loralie Champagne 11/11/2016

## 2016-11-13 NOTE — Discharge Instructions (Signed)

## 2016-11-16 ENCOUNTER — Telehealth (HOSPITAL_COMMUNITY): Payer: Self-pay | Admitting: *Deleted

## 2016-11-16 ENCOUNTER — Encounter: Payer: Medicare Other | Admitting: Physical Therapy

## 2016-11-16 MED ORDER — CLOPIDOGREL BISULFATE 75 MG PO TABS: 75.0000 mg | ORAL_TABLET | Freq: Every day | ORAL | 0 refills | Status: DC

## 2016-11-16 NOTE — Telephone Encounter (Signed)
Per Dr Aundra Dubin pt will need Plavix 75 mg daily for 1 month post cardiomems implant.  Pt is aware, rx sent in

## 2016-11-17 ENCOUNTER — Telehealth (HOSPITAL_COMMUNITY): Payer: Self-pay | Admitting: *Deleted

## 2016-11-17 NOTE — Telephone Encounter (Signed)
Reviewed pt's cardiomems w/Amy Clegg, NP, PAD trending up, per Amy have pt take extra dose of Torsemide tonight, keep f/u appt as sch tomorrow.  Pt aware, agreeable and verbalizes understanding

## 2016-11-18 ENCOUNTER — Ambulatory Visit: Payer: Medicare Other | Admitting: Physical Therapy

## 2016-11-18 ENCOUNTER — Ambulatory Visit (HOSPITAL_COMMUNITY)
Admission: RE | Admit: 2016-11-18 | Discharge: 2016-11-18 | Disposition: A | Discharge status: Home / Self Care | Payer: Medicare Other | Source: Ambulatory Visit | Attending: Cardiology | Admitting: Cardiology

## 2016-11-18 VITALS — BP 134/86 | HR 84 | Wt 260.4 lb

## 2016-11-18 DIAGNOSIS — I130 Hypertensive heart and chronic kidney disease with heart failure and stage 1 through stage 4 chronic kidney disease, or unspecified chronic kidney disease: Secondary | ICD-10-CM | POA: Diagnosis not present

## 2016-11-18 DIAGNOSIS — Z7902 Long term (current) use of antithrombotics/antiplatelets: Secondary | ICD-10-CM | POA: Diagnosis not present

## 2016-11-18 DIAGNOSIS — I890 Lymphedema, not elsewhere classified: Secondary | ICD-10-CM | POA: Insufficient documentation

## 2016-11-18 DIAGNOSIS — N183 Chronic kidney disease, stage 3 unspecified: Secondary | ICD-10-CM

## 2016-11-18 DIAGNOSIS — M25562 Pain in left knee: Secondary | ICD-10-CM | POA: Diagnosis not present

## 2016-11-18 DIAGNOSIS — M6281 Muscle weakness (generalized): Secondary | ICD-10-CM

## 2016-11-18 DIAGNOSIS — M797 Fibromyalgia: Secondary | ICD-10-CM | POA: Diagnosis not present

## 2016-11-18 DIAGNOSIS — I2510 Atherosclerotic heart disease of native coronary artery without angina pectoris: Secondary | ICD-10-CM | POA: Insufficient documentation

## 2016-11-18 DIAGNOSIS — I5032 Chronic diastolic (congestive) heart failure: Secondary | ICD-10-CM | POA: Diagnosis not present

## 2016-11-18 DIAGNOSIS — Z79899 Other long term (current) drug therapy: Secondary | ICD-10-CM | POA: Diagnosis not present

## 2016-11-18 DIAGNOSIS — R079 Chest pain, unspecified: Secondary | ICD-10-CM | POA: Insufficient documentation

## 2016-11-18 DIAGNOSIS — Z7982 Long term (current) use of aspirin: Secondary | ICD-10-CM | POA: Diagnosis not present

## 2016-11-18 DIAGNOSIS — G8929 Other chronic pain: Secondary | ICD-10-CM

## 2016-11-18 DIAGNOSIS — R262 Difficulty in walking, not elsewhere classified: Secondary | ICD-10-CM

## 2016-11-18 MED ORDER — METOLAZONE 2.5 MG PO TABS: 2.5000 mg | ORAL_TABLET | ORAL | 6 refills | Status: DC

## 2016-11-18 NOTE — Progress Notes (Signed)
Advanced Heart Failure Clinic Note   Primary Care: Dr. Edrick Oh Primary Cardiologist: Dr. Angelena Form Renal: Dr Joelyn Oms HF Cardiology: Dr. Aundra Dubin  HPI: [name] is a 71 y.o. with history of chronic diastolic CHF (Echo 12/08/58 LVEF 65%, Mild LAE), chronic lymphedema, chronic back pain, HTN, CKD stage 3, and fibromyalgia.    Admitted several times in 02/2016 to [facility] with CHF and chest pain.  Has been tried on spironolactone + metolazone alone as diuretic regimen. Follows with renal as above.   Seen in [facility] clinic 04/16/16. Thought to have mild volume overload. Lasix dose increased to 40 mg daily.  She was admitted in 9/17 with chest pain and AKI.  She had seen nephrology and was given metolazone to take daily along with Lasix, creatinine rose considerably.  Lasix was decreased to 40 mg bid.  When creatinine improved, she had right and left heart cath showing nonobstructive CAD and near-normal filling pressures.  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    Had Cardiomemes placed on 11/13/16. PA diastolic 41mmHg at implant time, up to 17mm Hg today. Called her yesterday to instruct her to take an extra dose of torsemide as her PA diastolic was 15.   Returns today for HF follow up. Feels ok today, breathing labored today and her chest feels heavy. Has been drinking less than 2L, eating a low sodium diet. Does not eat out.  Weights at home trending up 258-260 pounds. Stopped taking metolazone twice a day. Sleeping sitting up, no PND. Denies chest pain. Not sleeping well. Taking her medications with compliance. Cannot walk through the grocery store without SOB, feels SOB at rest today. Notes more swelling in her legs.   Only using lymphedema pump once a day.                                                                                                                                                             Labs (9/17): K 4.6, creatinine 1.55, BNP  63 Labs (10/17): K 4.7, creatinine 1.8 Labs ( 09/06/2016): K 3.7 Creatinine 2.05  Labs (1/18): K 5.1, creatinine 2.03 Labs (2/18): hgb 12, K 4.3, creatinine 1.87 Labs (11/13/2016) K 3.8 Creatinine 2.12   ROS: All systems reviewed and negative except as per HPI.  Past Medical History 1. Chronic diastolic CHF - Echo 02/21/15 LVEF 65%, Mild LAE - RHC (10/17): mean RA 4, PA 25/11, mean PCWP 5, CI 2.4.  - Echo (2/18): EF 55-60%, mild LVH, normal RV size and systolic function. 2. CKD stage 3: Follows with nephology. Baseline appears to be creatinine 1.4-1.6.  3. Chronic lymphedema: Has had wraps.  4. HTN 5. Dementia 6. Fibromyalgia 7. Microvascular angina:  - Normal coronaries on Cath 2009 - Stress Myoview 04/09/16 with no ischemia -  LHC (10/17) with mild nonobstructive disease.   FH: Mother with CHF, brother with "heart disease."  Another brother with h/o MI.   Current Outpatient Prescriptions  Medication Sig Dispense Refill  . alendronate (FOSAMAX) 70 MG tablet Take 70 mg by mouth every Friday. Take with a full glass of water on an empty stomach.     Marland Kitchen amLODipine (NORVASC) 10 MG tablet Take 1 tablet (10 mg total) by mouth daily. 30 tablet 6  . aspirin 81 MG tablet Take 81 mg by mouth at bedtime.    . baclofen (LIORESAL) 10 MG tablet Take 10 mg by mouth 3 (three) times daily.    . clopidogrel (PLAVIX) 75 MG tablet Take 1 tablet (75 mg total) by mouth daily. 30 tablet 0  . donepezil (ARICEPT) 10 MG tablet Take 10 mg by mouth at bedtime.     . DULoxetine (CYMBALTA) 20 MG capsule Take 1 capsule by mouth daily.    Marland Kitchen escitalopram (LEXAPRO) 20 MG tablet Take 30 mg by mouth daily with supper.     . fentaNYL (DURAGESIC - DOSED MCG/HR) 25 MCG/HR patch Place 25 mcg onto the skin every 3 (three) days.     . folic acid (FOLVITE) 1 MG tablet Take 1 mg by mouth 2 (two) times daily.    Marland Kitchen HYDROcodone-acetaminophen (NORCO) 7.5-325 MG tablet Take 1 tablet by mouth every 6 (six) hours as needed for  moderate pain.    Marland Kitchen lamoTRIgine (LAMICTAL) 100 MG tablet Take 100 mg by mouth 2 (two) times daily.     Marland Kitchen levothyroxine (SYNTHROID) 75 MCG tablet Take 75 mcg by mouth daily before breakfast.     . memantine (NAMENDA) 10 MG tablet Take 10 mg by mouth 2 (two) times daily.    . pravastatin (PRAVACHOL) 80 MG tablet Take 80 mg by mouth at bedtime.     . ranitidine (ZANTAC) 150 MG tablet Take 150 mg by mouth 2 (two) times daily.    Marland Kitchen rOPINIRole (REQUIP) 1 MG tablet Take 1 mg by mouth 3 (three) times daily.    . sennosides-docusate sodium (SENOKOT-S) 8.6-50 MG tablet Take 2 tablets by mouth at bedtime.    Marland Kitchen spironolactone (ALDACTONE) 25 MG tablet Take 25 mg by mouth 2 (two) times daily.    Marland Kitchen torsemide (DEMADEX) 20 MG tablet Take 4 tablets (80 mg total) by mouth daily. 360 tablet 3  . amoxicillin (AMOXIL) 500 MG capsule Take 2,000 mg by mouth as directed. Take 1 hour before dental procedure    . metolazone (ZAROXOLYN) 2.5 MG tablet Take 1 tablet (2.5 mg total) by mouth 2 (two) times a week. Every Tuesday and Saturday (Patient not taking: Reported on 11/18/2016) 15 tablet 6   No current facility-administered medications for this encounter.     Allergies  Allergen Reactions  . Hydromorphone Other (See Comments)    Hallucinations, altered mental state      Social History   Social History  . Marital status: Widowed    Spouse name: N/A  . Number of children: 3  . Years of education: N/A   Occupational History  . retired    Social History Main Topics  . Smoking status: Never Smoker  . Smokeless tobacco: Never Used  . Alcohol use No  . Drug use: No  . Sexual activity: Not Currently   Other Topics Concern  . Not on file   Social History Narrative   Pt does use caffeine. Lives alone. 3 children. Retired.    Vitals:  11/18/16 1519  BP: 134/86  Pulse: 84  SpO2: 96%  Weight: 260 lb 6.4 oz (118.1 kg)   Wt Readings from Last 3 Encounters:  11/18/16 260 lb 6.4 oz (118.1 kg)  11/13/16  260 lb (117.9 kg)  11/10/16 266 lb 8 oz (120.9 kg)    PHYSICAL EXAM: General: Elderly female, ambulated into clinic with walker.  Neck: JVP hard to assess, neck thick. No thyromegaly or thyroid nodule.  Lungs: Clear in upper lobes, crackles in bases.  CV: Nondisplaced PMI.  Regular rate and rhythm. No murmurs. No carotid bruit.   Abdomen: Soft, nontender, no hepatosplenomegaly, no distention.  Skin: Intact without lesions, edema  Neurologic: Alert and oriented x 3.  Psych: Normal affect. Extremities: No clubbing or cyanosis. Bilateral lymphedema. 2+ pedal edema, some erythema noted on bilateral ankles.  HEENT: Normal.   ASSESSMENT & PLAN: 1. Chronic diastolic CHF:  NYHA class IIIb symptoms,  Echo (2/18) with EF 55-60%, normal RV.   Has Cardiomems. PAD trending up from 9>15 mmhg. Likely because she stopped taking metolazone.  - Volume overloaded on exam. Continue torsemide 80mg  daily. Restart metolazone twice a week.  - Hopefully this will help with volume overload.  2. Lymphedema:  - Will provide Rx for TED hose, she needs a new pair.   Asked to resume Lymphedema pumps.  3. CKD: Stable.  - Creatinine 2.12 on 11/13/2016 4. Chest pain: LHC 2017 Nonobstructive.  Continue Imdur.      Has follow up scheduled for 12/02/16 with Dr. Aundra Dubin.   Darrick Grinder, NP-C 11/18/2016

## 2016-11-18 NOTE — Progress Notes (Signed)
Advanced Heart Failure Medication Review by a Pharmacist  Does the patient  feel that his/her medications are working for him/her?  yes  Has the patient been experiencing any side effects to the medications prescribed?  no  Does the patient measure his/her own blood pressure or blood glucose at home?  yes - doing good, HR up per patient (in 42s)  Does the patient have any problems obtaining medications due to transportation or finances?   no  Understanding of regimen: good Understanding of indications: good Potential of compliance: good Patient understands to avoid NSAIDs. Patient understands to avoid decongestants.  Issues to address at subsequent visits: None  Pharmacist comments: Ms. Sportsman is a pleasant 71 yo Caucasian female presenting to [facility] with her daughter. She brought in her medication list from this week's medication bubble package (filled each Wednesday). No medication discrepancies. Endorses good adherence. No questions or concerns.  Belia Heman, PharmD PGY1 Pharmacy Resident [phone] (Pager) 11/18/2016 3:21 PM  Time with patient: 10 min Preparation and documentation time: 2 min Total time: 12 min

## 2016-11-18 NOTE — Patient Instructions (Signed)
Wear compression stockings as much as tolerated. Remove for bathing and sleeping.  Take Metolazone 2.5 mg tablet once tomorrow and once Saturday. Then take every Tuesday and Saturday.  Follow up ash scheduled. See next sheet for additional details.  Do the following things EVERYDAY: 1) Weigh yourself in the morning before breakfast. Write it down and keep it in a log. 2) Take your medicines as prescribed 3) Eat low salt foods-Limit salt (sodium) to 2000 mg per day.  4) Stay as active as you can everyday 5) Limit all fluids for the day to less than 2 liters

## 2016-11-18 NOTE — Therapy (Addendum)
[facility] [place], [place], [address] Phone: [phone]   Fax:  [phone]  Physical Therapy Treatment  Patient Details  Name: ARLICIA PAQUETTE MRN: [id number] Date of Birth: [birth year] Referring Provider: Earlie Server, MD  Encounter Date: 11/18/2016      PT End of Session - 12/07/16 0955    Visit Number 16   Number of Visits 24   Date for PT Re-Evaluation 01/01/17   PT Start Time 0954   PT Stop Time 1045   PT Time Calculation (min) 51 min   Activity Tolerance Patient tolerated treatment well   Behavior During Therapy [facility] for tasks assessed/performed      Past Medical History:  Diagnosis Date  . Anemia   . Anxiety   . Arthritis   . Brain lesion    2 types  . Cervical spondylosis with myelopathy   . Chest pain    Normal cardiac cath 5/09  . Congestive heart failure (CHF) ([place])   . Depression   . Dumping syndrome   . Edema   . Fatigue   . Fibromyalgia   . History of echocardiogram    a. Echo 03/20/16 (done at [facility] in [place], [place]):  mild LVH, EF 27%, normal diastolic function, mild LAE, MAC, RVSP 25 mmHg  . Hyperlipidemia   . Hypertension   . Hypothyroidism   . Lumbar spondylosis   . Lymphedema    seeing specialist for this  . Restless leg syndrome   . Rotator cuff injury   . Stroke [facility])    TIAs "mini strokes"- unsure of last TIA  . Syncope   . Tremors of nervous system     Past Surgical History:  Procedure Laterality Date  . APPENDECTOMY  1966  . CARDIAC CATHETERIZATION N/A 05/25/2016   Procedure: Right/Left Heart Cath and Coronary Angiography;  Surgeon: Larey Dresser, MD;  Location: [place] CV [facility];  Service: Cardiovascular;  Laterality: N/A;  . CHOLECYSTECTOMY  2015  . CHOLECYSTECTOMY  2015  . CORONARY ANGIOGRAM  2009   Normal coronaries  . Hip Bursa Surgery Bilateral   . KNEE ARTHROSCOPY Bilateral   . LUMBAR DISC SURGERY     x2  . LUMBAR FUSION    . RIGHT HEART CATH  N/A 11/13/2016   Procedure: Right Heart Cath with Cardiomems;  Surgeon: Larey Dresser, MD;  Location: [place] CV LAB;  Service: Cardiovascular;  Laterality: N/A;  . ROTATOR CUFF REPAIR Bilateral   . TOTAL ABDOMINAL HYSTERECTOMY    . TOTAL KNEE ARTHROPLASTY Left   . TOTAL KNEE ARTHROPLASTY Right 11/18/2012   Procedure: TOTAL KNEE ARTHROPLASTY;  Surgeon: Yvette Rack., MD;  Location: [place];  Service: Orthopedics;  Laterality: Right;    There were no vitals filed for this visit.      Subjective Assessment - 12/07/16 0954    Subjective Reports that she was unable to go to appointment secondary to MD having an emergency but he told her that if paperwork was sent to him then he would send it back. Reports LE stiffness this morning.   Pertinent History Bilateral total knee replacements. Bilateral rotator cuff repairs   Limitations Walking   How long can you stand comfortably? Less than 5-10 minutes.   How long can you walk comfortably? < 5 min   Patient Stated Goals Get out of pain, walk better.    Currently in Pain? Other (Comment)  Reported soreness and stiffness but no pain assessment  provided            [facility] PT Assessment - 12/07/16 0001      Assessment   Medical Diagnosis left knee pain   Next MD Visit 12/11/2016     Restrictions   Weight Bearing Restrictions No                     [facility] Adult PT Treatment/Exercise - 12/07/16 0001      Knee/Hip Exercises: Aerobic   Nustep L5 x15 min, seat 12     Knee/Hip Exercises: Standing   Other Standing Knee Exercises Sidestepping in // bars x4 RT     Knee/Hip Exercises: Seated   Long Arc Quad Strengthening;Both;2 sets;10 reps;Weights   Long Arc Quad Weight 5 lbs.   Marching Limitations 2x10 with 5# bil   Hamstring Curl Strengthening;Both  x25 reps   Hamstring Limitations green t-band   Sit to Sand 20 reps;with UE support     Knee/Hip Exercises: Supine   Straight Leg Raises Strengthening;Both;2 sets;10 reps    Other Supine Knee/Hip Exercises Supine marching BLE x20 reps with core activation     Modalities   Modalities Electrical Stimulation;Moist Heat     Moist Heat Therapy   Number Minutes Moist Heat 15 Minutes   Moist Heat Location Knee     Electrical Stimulation   Electrical Stimulation Location L medial and lateral knee   Electrical Stimulation Action Pre-Mod   Electrical Stimulation Parameters 80-150 hz x`5 min   Electrical Stimulation Goals Pain                  PT Short Term Goals - 10/05/16 1037      PT SHORT TERM GOAL #1   Title STG's=LTG's.           PT Long Term Goals - 12/07/16 1033      PT LONG TERM GOAL #1   Title Independent with a HEP.   Time 8   Period Weeks   Status Partially Met     PT LONG TERM GOAL #2   Title Stand 20 minutes with left knee pain not > 3/10.   Baseline pt reporting 4-5/10 with doing dishes and standing for 20 minutes   Time 8   Period Weeks   Status On-going     PT LONG TERM GOAL #3   Title Perform ADL's with pain not > 3/10.   Baseline Pt reporting pain vaires with ADL's from 3-8/10.    Time 8   Period Weeks   Status On-going     PT LONG TERM GOAL #4   Title Pt to be walking 10 minutes twice a day to promote lymphatic circulation    Time 6   Period Weeks   Status On-going     PT LONG TERM GOAL #5   Title Pt to state she is able to walk for 10 minutes without difficulty ( pt limited to 10 minutes due to back issues)   Time 8   Period Weeks   Status On-going               Plan - 12/07/16 1038    Clinical Impression Statement Patient continues to do well with seated exercises and supine exercises with no increase in pain reported. Patient required a short rest period after 10 minutes on NuStep today due to fatigue. Shufling gait and poor foot clearance observed with ambulation. Core activation incorporated during supine exercises to assist with standing. No  extensor lag noted with SLR today although only  two sets of ten were completed. Goals remain on-going secondary to pain with activity or standing. Normal modalities response noted following removal of the modalities.   Rehab Potential Good   PT Frequency 2x / week   PT Duration 8 weeks   PT Treatment/Interventions Iontophoresis 2m/ml Dexamethasone;Moist Heat;Patient/family education;Therapeutic exercise;Therapeutic activities;Manual techniques;Vasopneumatic Device;Dry needling   PT Next Visit Plan Sitting supported LE strengthening exercises if pt tolerates, progress supine LE strengthening exercises with incresaed ankle weights. Continue with sit to stands for weightbearing and stability.   PT Home Exercise Plan Instructed in TENS use, continue SLR, sit to stand, supine marching, LAQ   Consulted and Agree with Plan of Care Patient      Patient will benefit from skilled therapeutic intervention in order to improve the following deficits and impairments:     Visit Diagnosis: Muscle weakness (generalized)  Chronic pain of left knee  Difficulty in walking, not elsewhere classified     Problem List Patient Active Problem List   Diagnosis Date Noted  . Unstable angina ([place] 05/21/2016  . Chronic diastolic heart failure ([place] 03/29/2016  . Normal coronary arteries 2009 01/14/2015  . Obesity-BMI 40 01/14/2015  . Restless leg 01/14/2015  . Chest pain 01/14/2015  . Back pain 01/14/2015  . Renal insufficiency 01/14/2015  . Dementia- mild memory issues 01/14/2015  . Occult blood positive stool 12/14/2014  . Anemia 12/14/2014  . Family history of colon cancer 12/14/2014  . Change in bowel habits 12/14/2014  . GERD (gastroesophageal reflux disease) 12/14/2014  . Dyspnea 05/17/2012  . Lymphedema 05/17/2012  . Weight gain 05/17/2012  . HTN (hypertension) 05/17/2012    Daruis Swaim, CMaliMPT 12/07/2016, 4:11 PM  [facility] [address]Phone: [phone]   Fax:  [phone] Name: CAMOY STEEVESMRN: [id number] of Birth: 6November 27, 1947

## 2016-11-20 ENCOUNTER — Telehealth (HOSPITAL_COMMUNITY): Payer: Self-pay | Admitting: *Deleted

## 2016-11-20 NOTE — Telephone Encounter (Signed)
Patient left message on triage line asking about her CardioMems report.  Darrick Grinder, NP reviewed it and said everything looked good.    I called patient back and she is aware with no further questions at this time.

## 2016-11-23 ENCOUNTER — Telehealth (HOSPITAL_COMMUNITY): Payer: Self-pay | Admitting: *Deleted

## 2016-11-23 ENCOUNTER — Ambulatory Visit: Payer: Medicare Other | Attending: Orthopedic Surgery | Admitting: Physical Therapy

## 2016-11-23 ENCOUNTER — Encounter: Payer: Self-pay | Admitting: Physical Therapy

## 2016-11-23 DIAGNOSIS — M25562 Pain in left knee: Secondary | ICD-10-CM | POA: Diagnosis present

## 2016-11-23 DIAGNOSIS — G8929 Other chronic pain: Secondary | ICD-10-CM

## 2016-11-23 DIAGNOSIS — R262 Difficulty in walking, not elsewhere classified: Secondary | ICD-10-CM | POA: Diagnosis present

## 2016-11-23 DIAGNOSIS — M6281 Muscle weakness (generalized): Secondary | ICD-10-CM | POA: Insufficient documentation

## 2016-11-23 NOTE — Therapy (Signed)
[facility] [place], [place], [address] Phone: [phone]   Fax:  [phone]  Physical Therapy Treatment  Patient Details  Name: [name] MRN: [id number] Date of Birth: 1946/03/24 Referring Provider: Earlie Server, MD  Encounter Date: 11/23/2016      PT End of Session - 11/23/16 0947    Visit Number 12   Number of Visits 16   Date for PT Re-Evaluation 12/04/16   PT Start Time 0944   PT Stop Time 1049  3 units secondary to consult with other PTs, bathroom break and use of moist heat   PT Time Calculation (min) 65 min   Activity Tolerance Patient tolerated treatment well   Behavior During Therapy [facility] for tasks assessed/performed      Past Medical History:  Diagnosis Date  . Anemia   . Anxiety   . Arthritis   . Brain lesion    2 types  . Cervical spondylosis with myelopathy   . Chest pain    Normal cardiac cath 5/09  . Congestive heart failure (CHF) ([place])   . Depression   . Dumping syndrome   . Edema   . Fatigue   . Fibromyalgia   . History of echocardiogram    a. Echo 03/20/16 (done at [facility] in [place], [place]):  mild LVH, EF 66%, normal diastolic function, mild LAE, MAC, RVSP 25 mmHg  . Hyperlipidemia   . Hypertension   . Hypothyroidism   . Lumbar spondylosis   . Lymphedema    seeing specialist for this  . Restless leg syndrome   . Rotator cuff injury   . Stroke [facility])    TIAs "mini strokes"- unsure of last TIA  . Syncope   . Tremors of nervous system     Past Surgical History:  Procedure Laterality Date  . APPENDECTOMY  1966  . CARDIAC CATHETERIZATION N/A 05/25/2016   Procedure: Right/Left Heart Cath and Coronary Angiography;  Surgeon: Larey Dresser, MD;  Location: [place] CV LAB;  Service: Cardiovascular;  Laterality: N/A;  . CHOLECYSTECTOMY  2015  . CHOLECYSTECTOMY  2015  . CORONARY ANGIOGRAM  2009   Normal coronaries  . Hip Bursa Surgery Bilateral   . KNEE ARTHROSCOPY  Bilateral   . LUMBAR DISC SURGERY     x2  . LUMBAR FUSION    . RIGHT HEART CATH N/A 11/13/2016   Procedure: Right Heart Cath with Cardiomems;  Surgeon: Larey Dresser, MD;  Location: [place] CV [facility];  Service: Cardiovascular;  Laterality: N/A;  . ROTATOR CUFF REPAIR Bilateral   . TOTAL ABDOMINAL HYSTERECTOMY    . TOTAL KNEE ARTHROPLASTY Left   . TOTAL KNEE ARTHROPLASTY Right 11/18/2012   Procedure: TOTAL KNEE ARTHROPLASTY;  Surgeon: Yvette Rack., MD;  Location: [place];  Service: Orthopedics;  Laterality: Right;    There were no vitals filed for this visit.      Subjective Assessment - 11/23/16 0946    Subjective Reports that she won't be able to complete 20 minutes on Nustep today as she had increased pain following previous treatment and had a knot come up on the posterior L calf.   Pertinent History Bilateral total knee replacements. Bilateral rotator cuff repairs   Limitations Walking   How long can you stand comfortably? 5-10 minutes.   How long can you walk comfortably? < 5 min   Patient Stated Goals Get out of pain, walk better.    Currently in Pain? Yes  Pain Score 4    Pain Location Knee   Pain Orientation Left   Pain Descriptors / Indicators Sore   Pain Type Chronic pain   Pain Onset More than a month ago            [facility] PT Assessment - 11/23/16 0001      Assessment   Medical Diagnosis left knee pain   Next MD Visit 11/27/2016     Restrictions   Weight Bearing Restrictions No                     [facility] Adult PT Treatment/Exercise - 11/23/16 0001      Knee/Hip Exercises: Aerobic   Nustep L5 x15 min     Knee/Hip Exercises: Seated   Long Arc Quad Strengthening;Both;3 sets;10 reps;Weights   Long Arc Quad Weight 3 lbs.   Hamstring Curl Both;2 sets;10 reps   Hamstring Limitations green t-band     Knee/Hip Exercises: Supine   Heel Slides AROM;Both;1 set;15 reps   Straight Leg Raises Strengthening;Both;2 sets;10 reps   Other Supine  Knee/Hip Exercises Supine marching BLE x20 reps each with 2#   Other Supine Knee/Hip Exercises B hip clamshelll green theraband x20 reps     Modalities   Modalities Electrical Stimulation;Moist Heat     Moist Heat Therapy   Number Minutes Moist Heat 15 Minutes   Moist Heat Location Knee     Electrical Stimulation   Electrical Stimulation Location L medial knee/ posteriosuperior calf   Electrical Stimulation Action Pre-Mod   Electrical Stimulation Parameters 80-150 hz x15 min   Electrical Stimulation Goals Pain                  PT Short Term Goals - 10/05/16 1037      PT SHORT TERM GOAL #1   Title STG's=LTG's.           PT Long Term Goals - 11/09/16 1136      PT LONG TERM GOAL #1   Title Independent with a HEP.   Time 8   Period Weeks   Status Partially Met     PT LONG TERM GOAL #2   Title Stand 20 minutes with left knee pain not > 3/10.   Baseline pt reporting 4-5/10 with doing dishes and standing for 20 minutes   Time 8   Period Weeks   Status On-going     PT LONG TERM GOAL #3   Title Perform ADL's with pain not > 3/10.   Baseline Pt reporting pain vaires with ADL's from 3-8/10.    Time 8   Period Weeks   Status On-going     PT LONG TERM GOAL #4   Title Pt to be walking 10 minutes twice a day to promote lymphatic circulation    Time 6   Period Weeks   Status On-going     PT LONG TERM GOAL #5   Title Pt to state she is able to walk for 10 minutes without difficulty ( pt limited to 10 minutes due to back issues)   Time 8   Period Weeks   Status On-going               Plan - 11/23/16 1037    Clinical Impression Statement Patient presented in clinic with reports of increased pain throughout the weekend. Patient was educated to inform PTA of any increased pain and smaller reps would be completed to avoid reaggravating pain. Patient indicated throughout treatment  of inflammed region in posterior L knee. Patient completed exercises well with  rest breaks required between sets and exercises. Patient experienced decreased knee discomfort following exercises during ambulation per patient report. Patient ambulated with rollator in clinic and unable to ambulate within her home this weekend with cane per patient report. Goals remain on-going at this time secondary to inability to stand or walk for long periods of time secondary to pain. Normal modalities response noted following removal of the modalities.    Rehab Potential Good   PT Frequency 2x / week   PT Duration 8 weeks   PT Treatment/Interventions Iontophoresis 92m/ml Dexamethasone;Moist Heat;Patient/family education;Therapeutic exercise;Therapeutic activities;Manual techniques;Vasopneumatic Device;Dry needling   PT Next Visit Plan Sitting supported LE strengthening exercises if pt tolerates, progress supine LE strengthening exercises with incresaed ankle weights   PT Home Exercise Plan Instructed in TENS use, continue SLR, sit to stand, supine marching, LAQ   Consulted and Agree with Plan of Care Patient      Patient will benefit from skilled therapeutic intervention in order to improve the following deficits and impairments:  Decreased activity tolerance, Decreased strength, Pain, Postural dysfunction, Impaired perceived functional ability, Decreased balance, Decreased mobility  Visit Diagnosis: Muscle weakness (generalized)  Chronic pain of left knee     Problem List Patient Active Problem List   Diagnosis Date Noted  . Unstable angina ([place] 05/21/2016  . Chronic diastolic heart failure ([place] 03/29/2016  . Normal coronary arteries 2009 01/14/2015  . Obesity-BMI 40 01/14/2015  . Restless leg 01/14/2015  . Chest pain 01/14/2015  . Back pain 01/14/2015  . Renal insufficiency 01/14/2015  . Dementia- mild memory issues 01/14/2015  . Occult blood positive stool 12/14/2014  . Anemia 12/14/2014  . Family history of colon cancer 12/14/2014  . Change in bowel habits  12/14/2014  . GERD (gastroesophageal reflux disease) 12/14/2014  . Dyspnea 05/17/2012  . Lymphedema 05/17/2012  . Weight gain 05/17/2012  . HTN (hypertension) 05/17/2012    KWynelle Fanny PTA 11/23/2016, 11:01 AM  [facility]MN[address] [address]Phone: [phone]  Fax:  [phone] Name: [name] ROVIRAMRN: [id number] of Birth: 605-20-1947

## 2016-11-23 NOTE — Telephone Encounter (Signed)
Pt's cardomems number is increasing, per Darrick Grinder, NP have pt take Metolazone today instead of tomorrow, continue to monitor.  Pt aware, agreeable and verbalizes understanding.

## 2016-11-25 ENCOUNTER — Encounter: Payer: Self-pay | Admitting: Physical Therapy

## 2016-11-25 ENCOUNTER — Ambulatory Visit: Payer: Medicare Other | Admitting: Physical Therapy

## 2016-11-25 DIAGNOSIS — M6281 Muscle weakness (generalized): Secondary | ICD-10-CM | POA: Diagnosis not present

## 2016-11-25 DIAGNOSIS — M25562 Pain in left knee: Secondary | ICD-10-CM

## 2016-11-25 DIAGNOSIS — G8929 Other chronic pain: Secondary | ICD-10-CM

## 2016-11-25 NOTE — Therapy (Signed)
[name], [place], [address] Phone: [phone]   Fax:  [phone]  Physical Therapy Treatment  Patient Details  Name: [name] MRN: [id number] Date of Birth: 03-Aug-1946 Referring Provider: Earlie Server, MD  Encounter Date: 11/25/2016      PT End of Session - 11/25/16 0948    Visit Number 13   Number of Visits 16   Date for PT Re-Evaluation 12/04/16   PT Start Time 0946   PT Stop Time 1035   PT Time Calculation (min) 49 min   Activity Tolerance Patient tolerated treatment well   Behavior During Therapy [facility] for tasks assessed/performed      Past Medical History:  Diagnosis Date  . Anemia   . Anxiety   . Arthritis   . Brain lesion    2 types  . Cervical spondylosis with myelopathy   . Chest pain    Normal cardiac cath 5/09  . Congestive heart failure (CHF) ([place])   . Depression   . Dumping syndrome   . Edema   . Fatigue   . Fibromyalgia   . History of echocardiogram    a. Echo 03/20/16 (done at [facility] in [place], [place]):  mild LVH, EF 12%, normal diastolic function, mild LAE, MAC, RVSP 25 mmHg  . Hyperlipidemia   . Hypertension   . Hypothyroidism   . Lumbar spondylosis   . Lymphedema    seeing specialist for this  . Restless leg syndrome   . Rotator cuff injury   . Stroke [facility])    TIAs "mini strokes"- unsure of last TIA  . Syncope   . Tremors of nervous system     Past Surgical History:  Procedure Laterality Date  . APPENDECTOMY  1966  . CARDIAC CATHETERIZATION N/A 05/25/2016   Procedure: Right/Left Heart Cath and Coronary Angiography;  Surgeon: Larey Dresser, MD;  Location: [place] CV LAB;  Service: Cardiovascular;  Laterality: N/A;  . CHOLECYSTECTOMY  2015  . CHOLECYSTECTOMY  2015  . CORONARY ANGIOGRAM  2009   Normal coronaries  . Hip Bursa Surgery Bilateral   . KNEE ARTHROSCOPY Bilateral   . LUMBAR DISC SURGERY     x2  . LUMBAR FUSION    . RIGHT HEART CATH N/A  11/13/2016   Procedure: Right Heart Cath with Cardiomems;  Surgeon: Larey Dresser, MD;  Location: [place] CV [facility];  Service: Cardiovascular;  Laterality: N/A;  . ROTATOR CUFF REPAIR Bilateral   . TOTAL ABDOMINAL HYSTERECTOMY    . TOTAL KNEE ARTHROPLASTY Left   . TOTAL KNEE ARTHROPLASTY Right 11/18/2012   Procedure: TOTAL KNEE ARTHROPLASTY;  Surgeon: Yvette Rack., MD;  Location: [place];  Service: Orthopedics;  Laterality: Right;    There were no vitals filed for this visit.      Subjective Assessment - 11/25/16 0947    Subjective Reports that she had some pain but not untolerable. Reports having cramping at night but was able to complete her exercises. Reports that she has been trying to stretch her knees.   Pertinent History Bilateral total knee replacements. Bilateral rotator cuff repairs   Limitations Walking   How long can you stand comfortably? 5-10 minutes.   How long can you walk comfortably? < 5 min   Patient Stated Goals Get out of pain, walk better.    Currently in Pain? Yes   Pain Score 2    Pain Location Knee   Pain Orientation Left  Pain Type Chronic pain   Pain Onset More than a month ago            [facility] PT Assessment - 11/25/16 0001      Assessment   Medical Diagnosis left knee pain   Next MD Visit 12/04/2016     Restrictions   Weight Bearing Restrictions No                     [facility] Adult PT Treatment/Exercise - 11/25/16 0001      Knee/Hip Exercises: Aerobic   Nustep L5 x15 min, seat 12     Knee/Hip Exercises: Seated   Long Arc Quad Strengthening;Both;3 sets;10 reps;Weights   Long Arc Quad Weight 4 lbs.   Clamshell with TheraBand Green  x30 reps   Hamstring Curl Both;2 sets;10 reps   Hamstring Limitations green t-band   Sit to Sand 20 reps;without UE support     Knee/Hip Exercises: Supine   Straight Leg Raises Strengthening;Both;3 sets;10 reps     Modalities   Modalities Electrical Stimulation;Moist Heat     Moist Heat  Therapy   Number Minutes Moist Heat 15 Minutes   Moist Heat Location Knee     Electrical Stimulation   Electrical Stimulation Location L medial knee/ posteriosuperior calf   Electrical Stimulation Action Pre-Mod   Electrical Stimulation Parameters 80-150 hz x15 min   Electrical Stimulation Goals Pain                  PT Short Term Goals - 10/05/16 1037      PT SHORT TERM GOAL #1   Title STG's=LTG's.           PT Long Term Goals - 11/09/16 1136      PT LONG TERM GOAL #1   Title Independent with a HEP.   Time 8   Period Weeks   Status Partially Met     PT LONG TERM GOAL #2   Title Stand 20 minutes with left knee pain not > 3/10.   Baseline pt reporting 4-5/10 with doing dishes and standing for 20 minutes   Time 8   Period Weeks   Status On-going     PT LONG TERM GOAL #3   Title Perform ADL's with pain not > 3/10.   Baseline Pt reporting pain vaires with ADL's from 3-8/10.    Time 8   Period Weeks   Status On-going     PT LONG TERM GOAL #4   Title Pt to be walking 10 minutes twice a day to promote lymphatic circulation    Time 6   Period Weeks   Status On-going     PT LONG TERM GOAL #5   Title Pt to state she is able to walk for 10 minutes without difficulty ( pt limited to 10 minutes due to back issues)   Time 8   Period Weeks   Status On-going               Plan - 11/25/16 1022    Clinical Impression Statement Patient presented in clinic with lower knee pain reported and able to tolerate all seated exercises well. Patient able to tolerate increased resistance and more weightbearing (sit to stands) with only fatigue noted especially following sit to stands. Patient also able to complete more reps with SLR in BLE with improved form and strength notable. Normal modalities response noted following removal of the modalities.   Rehab Potential Good   PT Frequency  2x / week   PT Duration 8 weeks   PT Treatment/Interventions Iontophoresis 67m/ml  Dexamethasone;Moist Heat;Patient/family education;Therapeutic exercise;Therapeutic activities;Manual techniques;Vasopneumatic Device;Dry needling   PT Next Visit Plan Sitting supported LE strengthening exercises if pt tolerates, progress supine LE strengthening exercises with incresaed ankle weights. Continue with sit to stands for weightbearing and stability.   PT Home Exercise Plan Instructed in TENS use, continue SLR, sit to stand, supine marching, LAQ   Consulted and Agree with Plan of Care Patient      Patient will benefit from skilled therapeutic intervention in order to improve the following deficits and impairments:  Decreased activity tolerance, Decreased strength, Pain, Postural dysfunction, Impaired perceived functional ability, Decreased balance, Decreased mobility  Visit Diagnosis: Muscle weakness (generalized)  Chronic pain of left knee     Problem List Patient Active Problem List   Diagnosis Date Noted  . Unstable angina ([place] 05/21/2016  . Chronic diastolic heart failure ([place] 03/29/2016  . Normal coronary arteries 2009 01/14/2015  . Obesity-BMI 40 01/14/2015  . Restless leg 01/14/2015  . Chest pain 01/14/2015  . Back pain 01/14/2015  . Renal insufficiency 01/14/2015  . Dementia- mild memory issues 01/14/2015  . Occult blood positive stool 12/14/2014  . Anemia 12/14/2014  . Family history of colon cancer 12/14/2014  . Change in bowel habits 12/14/2014  . GERD (gastroesophageal reflux disease) 12/14/2014  . Dyspnea 05/17/2012  . Lymphedema 05/17/2012  . Weight gain 05/17/2012  . HTN (hypertension) 05/17/2012    KWynelle Fanny PTA 11/25/2016, 10:44 AM  [facility] [address]Phone: [phone]  Fax:  [phone] Name: CABBI MANCINIMRN: [id number] of Birth: [birth year]

## 2016-11-26 ENCOUNTER — Telehealth (HOSPITAL_COMMUNITY): Payer: Self-pay | Admitting: *Deleted

## 2016-11-26 MED ORDER — TORSEMIDE 20 MG PO TABS: ORAL_TABLET | ORAL | 3 refills | Status: DC

## 2016-11-26 NOTE — Telephone Encounter (Signed)
Pt's Cardiomems elevated at 16, per Darrick Grinder, NP have pt increase torsemide dose to 80 mg in AM and 40 mg in PM.  Pt aware and agreeable, rx sent to pharmacy, pt is sch for f/u next week (4/11) and will keep that appointment.

## 2016-11-30 ENCOUNTER — Telehealth (HOSPITAL_COMMUNITY): Payer: Self-pay | Admitting: *Deleted

## 2016-11-30 ENCOUNTER — Encounter: Payer: Medicare Other | Admitting: Physical Therapy

## 2016-11-30 NOTE — Telephone Encounter (Signed)
Pt's Cardiomems is elevated, she has been at 17 since Fri.  Pt states she is SOB and has some chest "tighness" reviewed all with Darrick Grinder, NP and Dr Aundra Dubin.  Recommend pt take extra Metolazone today.  Pt aware and agreeable

## 2016-12-01 ENCOUNTER — Encounter: Payer: Self-pay | Admitting: Physical Therapy

## 2016-12-01 ENCOUNTER — Ambulatory Visit: Payer: Medicare Other | Admitting: Physical Therapy

## 2016-12-01 DIAGNOSIS — M6281 Muscle weakness (generalized): Secondary | ICD-10-CM | POA: Diagnosis not present

## 2016-12-01 DIAGNOSIS — G8929 Other chronic pain: Secondary | ICD-10-CM

## 2016-12-01 DIAGNOSIS — R262 Difficulty in walking, not elsewhere classified: Secondary | ICD-10-CM

## 2016-12-01 DIAGNOSIS — M25562 Pain in left knee: Secondary | ICD-10-CM

## 2016-12-01 NOTE — Therapy (Signed)
[facility] [place], [place], [address] Phone: [phone]   Fax:  [phone]  Physical Therapy Treatment  Patient Details  Name: [name] MRN: [id number] Date of Birth: [birth year] Referring Provider: Earlie Server, MD  Encounter Date: 12/01/2016      PT End of Session - 12/01/16 0957    Visit Number 14   Number of Visits 16   Date for PT Re-Evaluation 12/04/16   PT Start Time 0953   PT Stop Time 1042   PT Time Calculation (min) 49 min   Activity Tolerance Patient tolerated treatment well   Behavior During Therapy [facility] for tasks assessed/performed      Past Medical History:  Diagnosis Date  . Anemia   . Anxiety   . Arthritis   . Brain lesion    2 types  . Cervical spondylosis with myelopathy   . Chest pain    Normal cardiac cath 5/09  . Congestive heart failure (CHF) ([place])   . Depression   . Dumping syndrome   . Edema   . Fatigue   . Fibromyalgia   . History of echocardiogram    a. Echo 03/20/16 (done at [facility] in [place], [place]):  mild LVH, EF 48%, normal diastolic function, mild LAE, MAC, RVSP 25 mmHg  . Hyperlipidemia   . Hypertension   . Hypothyroidism   . Lumbar spondylosis   . Lymphedema    seeing specialist for this  . Restless leg syndrome   . Rotator cuff injury   . Stroke [facility])    TIAs "mini strokes"- unsure of last TIA  . Syncope   . Tremors of nervous system     Past Surgical History:  Procedure Laterality Date  . APPENDECTOMY  1966  . CARDIAC CATHETERIZATION N/A 05/25/2016   Procedure: Right/Left Heart Cath and Coronary Angiography;  Surgeon: Larey Dresser, MD;  Location: [place] [facility];  Service: Cardiovascular;  Laterality: N/A;  . CHOLECYSTECTOMY  2015  . CHOLECYSTECTOMY  2015  . CORONARY ANGIOGRAM  2009   Normal coronaries  . Hip Bursa Surgery Bilateral   . KNEE ARTHROSCOPY Bilateral   . LUMBAR DISC SURGERY     x2  . LUMBAR FUSION    . RIGHT HEART CATH  N/A 11/13/2016   Procedure: Right Heart Cath with Cardiomems;  Surgeon: Larey Dresser, MD;  Location: [place] [facility];  Service: Cardiovascular;  Laterality: N/A;  . ROTATOR CUFF REPAIR Bilateral   . TOTAL ABDOMINAL HYSTERECTOMY    . TOTAL KNEE ARTHROPLASTY Left   . TOTAL KNEE ARTHROPLASTY Right 11/18/2012   Procedure: TOTAL KNEE ARTHROPLASTY;  Surgeon: Yvette Rack., MD;  Location: [place];  Service: Orthopedics;  Laterality: Right;    There were no vitals filed for this visit.      Subjective Assessment - 12/01/16 0957    Subjective Reports that she is having stiffness in the L knee but used TENS unit on it.   Pertinent History Bilateral total knee replacements. Bilateral rotator cuff repairs   Limitations Walking   How long can you stand comfortably? 5-10 minutes.   How long can you walk comfortably? < 5 min   Patient Stated Goals Get out of pain, walk better.    Currently in Pain? Other (Comment)  Reports "a little" pain but no pain rating   Pain Location Knee   Pain Orientation Left;Anterior;Medial;Lateral   Pain Descriptors / Indicators Discomfort   Pain Type Chronic pain  Pain Onset More than a month ago            [facility] PT Assessment - 12/01/16 0001      Assessment   Medical Diagnosis left knee pain   Next MD Visit 12/04/2016     Restrictions   Weight Bearing Restrictions No                     [facility] Adult PT Treatment/Exercise - 12/01/16 0001      Knee/Hip Exercises: Aerobic   Nustep L5 x15 min, seat 12     Knee/Hip Exercises: Seated   Long Arc Quad Strengthening;Both;3 sets;10 reps;Weights   Long Arc Quad Weight 4 lbs.   Clamshell with TheraBand Green  x30 reps   Hamstring Curl Strengthening;Both;3 sets;10 reps   Hamstring Limitations green t-band   Sit to Sand 20 reps;without UE support     Knee/Hip Exercises: Supine   Straight Leg Raises Strengthening;Both;3 sets;10 reps     Modalities   Modalities Electrical Stimulation;Moist  Heat     Moist Heat Therapy   Number Minutes Moist Heat 15 Minutes   Moist Heat Location Knee     Electrical Stimulation   Electrical Stimulation Location L medial and lateral knee   Electrical Stimulation Action Pre-mod   Electrical Stimulation Parameters 80-150 hz x15 min   Electrical Stimulation Goals Pain                  PT Short Term Goals - 10/05/16 1037      PT SHORT TERM GOAL #1   Title STG's=LTG's.           PT Long Term Goals - 11/09/16 1136      PT LONG TERM GOAL #1   Title Independent with a HEP.   Time 8   Period Weeks   Status Partially Met     PT LONG TERM GOAL #2   Title Stand 20 minutes with left knee pain not > 3/10.   Baseline pt reporting 4-5/10 with doing dishes and standing for 20 minutes   Time 8   Period Weeks   Status On-going     PT LONG TERM GOAL #3   Title Perform ADL's with pain not > 3/10.   Baseline Pt reporting pain vaires with ADL's from 3-8/10.    Time 8   Period Weeks   Status On-going     PT LONG TERM GOAL #4   Title Pt to be walking 10 minutes twice a day to promote lymphatic circulation    Time 6   Period Weeks   Status On-going     PT LONG TERM GOAL #5   Title Pt to state she is able to walk for 10 minutes without difficulty ( pt limited to 10 minutes due to back issues)   Time 8   Period Weeks   Status On-going               Plan - 12/01/16 1037    Clinical Impression Statement Patient presented in clinic with reports of earlier episodes of pain more in medial and lateral L knee. Patient able to tolerate increased reps for all exercises completed today without complaint. Patient had some increased difficulty from sit to stands off plinth table in treatment room compared to larger plinth table in clinic gym during previous treatment. Patient did report fatigue with exercises and increased fluid over the weekend. Normal modalities response noted following removal of the modalities with  modalities  placed over anterior knee today.   Rehab Potential Good   PT Frequency 2x / week   PT Duration 8 weeks   PT Treatment/Interventions Iontophoresis 3m/ml Dexamethasone;Moist Heat;Patient/family education;Therapeutic exercise;Therapeutic activities;Manual techniques;Vasopneumatic Device;Dry needling   PT Next Visit Plan Sitting supported LE strengthening exercises if pt tolerates, progress supine LE strengthening exercises with incresaed ankle weights. Continue with sit to stands for weightbearing and stability.   PT Home Exercise Plan Instructed in TENS use, continue SLR, sit to stand, supine marching, LAQ   Consulted and Agree with Plan of Care Patient      Patient will benefit from skilled therapeutic intervention in order to improve the following deficits and impairments:  Decreased activity tolerance, Decreased strength, Pain, Postural dysfunction, Impaired perceived functional ability, Decreased balance, Decreased mobility  Visit Diagnosis: Muscle weakness (generalized)  Chronic pain of left knee  Difficulty in walking, not elsewhere classified     Problem List Patient Active Problem List   Diagnosis Date Noted  . Unstable angina ([place] 05/21/2016  . Chronic diastolic heart failure ([place] 03/29/2016  . Normal coronary arteries 2009 01/14/2015  . Obesity-BMI 40 01/14/2015  . Restless leg 01/14/2015  . Chest pain 01/14/2015  . Back pain 01/14/2015  . Renal insufficiency 01/14/2015  . Dementia- mild memory issues 01/14/2015  . Occult blood positive stool 12/14/2014  . Anemia 12/14/2014  . Family history of colon cancer 12/14/2014  . Change in bowel habits 12/14/2014  . GERD (gastroesophageal reflux disease) 12/14/2014  . Dyspnea 05/17/2012  . Lymphedema 05/17/2012  . Weight gain 05/17/2012  . HTN (hypertension) 05/17/2012    KWynelle Fanny PTA 12/01/2016, 10:56 AM  [facility]  [address]Phone: [phone]  Fax:  [phone] Name: [name] CUMBERLEDGEMRN: [id number] of Birth: [birth year]

## 2016-12-02 ENCOUNTER — Ambulatory Visit (HOSPITAL_COMMUNITY)
Admission: RE | Admit: 2016-12-02 | Discharge: 2016-12-02 | Disposition: A | Discharge status: Home / Self Care | Payer: Medicare Other | Source: Ambulatory Visit | Attending: Cardiology | Admitting: Cardiology

## 2016-12-02 ENCOUNTER — Encounter (HOSPITAL_COMMUNITY): Payer: Self-pay

## 2016-12-02 VITALS — BP 132/70 | HR 87 | Wt 256.2 lb

## 2016-12-02 DIAGNOSIS — N183 Chronic kidney disease, stage 3 unspecified: Secondary | ICD-10-CM

## 2016-12-02 DIAGNOSIS — M549 Dorsalgia, unspecified: Secondary | ICD-10-CM | POA: Insufficient documentation

## 2016-12-02 DIAGNOSIS — M797 Fibromyalgia: Secondary | ICD-10-CM | POA: Diagnosis not present

## 2016-12-02 DIAGNOSIS — G8929 Other chronic pain: Secondary | ICD-10-CM | POA: Insufficient documentation

## 2016-12-02 DIAGNOSIS — I5032 Chronic diastolic (congestive) heart failure: Secondary | ICD-10-CM | POA: Diagnosis not present

## 2016-12-02 DIAGNOSIS — Z79899 Other long term (current) drug therapy: Secondary | ICD-10-CM | POA: Insufficient documentation

## 2016-12-02 DIAGNOSIS — Z7982 Long term (current) use of aspirin: Secondary | ICD-10-CM | POA: Diagnosis not present

## 2016-12-02 DIAGNOSIS — I890 Lymphedema, not elsewhere classified: Secondary | ICD-10-CM | POA: Diagnosis not present

## 2016-12-02 DIAGNOSIS — I130 Hypertensive heart and chronic kidney disease with heart failure and stage 1 through stage 4 chronic kidney disease, or unspecified chronic kidney disease: Secondary | ICD-10-CM | POA: Insufficient documentation

## 2016-12-02 DIAGNOSIS — F0390 Unspecified dementia without behavioral disturbance: Secondary | ICD-10-CM | POA: Insufficient documentation

## 2016-12-02 LAB — BASIC METABOLIC PANEL
Anion gap: 18 — ABNORMAL HIGH (ref 5–15)
BUN: 85 mg/dL — ABNORMAL HIGH (ref 6–20)
CHLORIDE: 92 mmol/L — AB (ref 101–111)
CO2: 27 mmol/L (ref 22–32)
Calcium: 10.8 mg/dL — ABNORMAL HIGH (ref 8.9–10.3)
Creatinine, Ser: 2.79 mg/dL — ABNORMAL HIGH (ref 0.44–1.00)
GFR calc non Af Amer: 16 mL/min — ABNORMAL LOW (ref >60)
GFR, EST AFRICAN AMERICAN: 19 mL/min — AB (ref >60)
Glucose, Bld: 110 mg/dL — ABNORMAL HIGH (ref 65–99)
Potassium: 4 mmol/L (ref 3.5–5.1)
Sodium: 137 mmol/L (ref 135–145)

## 2016-12-02 NOTE — Patient Instructions (Signed)
Labs today  Your physician recommends that you schedule a follow-up appointment in: 2 months    

## 2016-12-03 ENCOUNTER — Encounter (HOSPITAL_COMMUNITY): Payer: Self-pay | Admitting: Adult Health

## 2016-12-03 ENCOUNTER — Encounter: Payer: Self-pay | Admitting: Physical Therapy

## 2016-12-03 ENCOUNTER — Ambulatory Visit: Payer: Medicare Other | Admitting: Physical Therapy

## 2016-12-03 DIAGNOSIS — G8929 Other chronic pain: Secondary | ICD-10-CM

## 2016-12-03 DIAGNOSIS — M6281 Muscle weakness (generalized): Secondary | ICD-10-CM | POA: Diagnosis not present

## 2016-12-03 DIAGNOSIS — R262 Difficulty in walking, not elsewhere classified: Secondary | ICD-10-CM

## 2016-12-03 DIAGNOSIS — M25562 Pain in left knee: Secondary | ICD-10-CM

## 2016-12-03 NOTE — Progress Notes (Signed)
Advanced Heart Failure Clinic Note   Primary Care: Dr. Edrick Oh Renal: Dr Joelyn Oms HF Cardiology: Dr. Aundra Dubin  HPI: [name] is a 71 y.o. with history of chronic diastolic CHF (Echo 7/34/19 LVEF 65%, Mild LAE), chronic lymphedema, chronic back pain, HTN, CKD stage 3, and fibromyalgia.    Admitted several times in 02/2016 to [facility] with CHF and chest pain.  Has been tried on spironolactone + metolazone alone as diuretic regimen. Follows with renal as above.   Seen in [facility] clinic 04/16/16. Thought to have mild volume overload. Lasix dose increased to 40 mg daily.  She was admitted in 9/17 with chest pain and AKI.  She had seen nephrology and was given metolazone to take daily along with Lasix, creatinine rose considerably.  Lasix was decreased to 40 mg bid.  When creatinine improved, she had right and left heart cath showing nonobstructive CAD and near-normal filling pressures.  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    Had Cardiomems placed on 11/13/16. PA diastolic 16mmHg at implant time, up to 17 mm Hg earlier this week.  PADP 13 today on Cardiomems check with extra metolazone.   Returns today for HF follow up. Doing better today.  Over the weekend, she felt frequent chest heaviness which for her tends to be a sign of fluid overload.  No lightheadedness/syncope.  No dyspnea walking around house.  Still dyspnea with stairs/inclines.                                                                                                                                                            Labs (9/17): K 4.6, creatinine 1.55, BNP 63 Labs (10/17): K 4.7, creatinine 1.8 Labs ( 09/06/2016): K 3.7 Creatinine 2.05  Labs (1/18): K 5.1, creatinine 2.03 Labs (2/18): hgb 12, K 4.3, creatinine 1.87 Labs (11/13/2016) K 3.8 Creatinine 2.12   ROS: All systems reviewed and negative except as per HPI.  Past Medical History 1. Chronic diastolic CHF - Echo 3/79/02  LVEF 65%, Mild LAE - RHC (10/17): mean RA 4, PA 25/11, mean PCWP 5, CI 2.4.  - Echo (2/18): EF 55-60%, mild LVH, normal RV size and systolic function. - Cardiomems implanted 3/18.  2. CKD stage 3: Follows with nephology. Baseline appears to be creatinine 1.4-1.6.  3. Chronic lymphedema: Has had wraps.  4. HTN 5. Dementia 6. Fibromyalgia 7. Microvascular angina:  - Normal coronaries on Cath 2009 - Stress Myoview 04/09/16 with no ischemia - LHC (10/17) with mild nonobstructive disease.   FH: Mother with CHF, brother with "heart disease."  Another brother with h/o MI.   Current Outpatient Prescriptions  Medication Sig Dispense Refill  . alendronate (FOSAMAX) 70 MG tablet Take 70 mg by mouth every Friday. Take with a full glass of  water on an empty stomach.     Marland Kitchen amLODipine (NORVASC) 10 MG tablet Take 1 tablet (10 mg total) by mouth daily. 30 tablet 6  . aspirin 81 MG tablet Take 81 mg by mouth at bedtime.    . baclofen (LIORESAL) 10 MG tablet Take 10 mg by mouth 3 (three) times daily.    . clopidogrel (PLAVIX) 75 MG tablet Take 1 tablet (75 mg total) by mouth daily. 30 tablet 0  . donepezil (ARICEPT) 10 MG tablet Take 10 mg by mouth at bedtime.     . DULoxetine (CYMBALTA) 20 MG capsule Take 1 capsule by mouth daily.    Marland Kitchen escitalopram (LEXAPRO) 20 MG tablet Take 30 mg by mouth daily with supper.     . fentaNYL (DURAGESIC - DOSED MCG/HR) 25 MCG/HR patch Place 25 mcg onto the skin every 3 (three) days.     . folic acid (FOLVITE) 1 MG tablet Take 1 mg by mouth 2 (two) times daily.    Marland Kitchen HYDROcodone-acetaminophen (NORCO) 7.5-325 MG tablet Take 1 tablet by mouth every 6 (six) hours as needed for moderate pain.    Marland Kitchen lamoTRIgine (LAMICTAL) 100 MG tablet Take 100 mg by mouth 2 (two) times daily.     . memantine (NAMENDA) 10 MG tablet Take 10 mg by mouth 2 (two) times daily.    . metolazone (ZAROXOLYN) 2.5 MG tablet Take 1 tablet (2.5 mg total) by mouth 2 (two) times a week. Every Tuesday and  Saturday 15 tablet 6  . pravastatin (PRAVACHOL) 80 MG tablet Take 80 mg by mouth at bedtime.     . ranitidine (ZANTAC) 150 MG tablet Take 150 mg by mouth 2 (two) times daily.    Marland Kitchen rOPINIRole (REQUIP) 1 MG tablet Take 1 mg by mouth 3 (three) times daily.    . sennosides-docusate sodium (SENOKOT-S) 8.6-50 MG tablet Take 2 tablets by mouth at bedtime.    Marland Kitchen spironolactone (ALDACTONE) 25 MG tablet Take 25 mg by mouth 2 (two) times daily.    Marland Kitchen torsemide (DEMADEX) 20 MG tablet Take 4 tabs in AM and 2 tabs in PM 180 tablet 3  . amoxicillin (AMOXIL) 500 MG capsule Take 2,000 mg by mouth as directed. Take 1 hour before dental procedure     No current facility-administered medications for this encounter.     Allergies  Allergen Reactions  . Hydromorphone Other (See Comments)    Hallucinations, altered mental state      Social History   Social History  . Marital status: Widowed    Spouse name: N/A  . Number of children: 3  . Years of education: N/A   Occupational History  . retired    Social History Main Topics  . Smoking status: Never Smoker  . Smokeless tobacco: Never Used  . Alcohol use No  . Drug use: No  . Sexual activity: Not Currently   Other Topics Concern  . Not on file   Social History Narrative   Pt does use caffeine. Lives alone. 3 children. Retired.    Vitals:   12/02/16 1038  BP: 132/70  Pulse: 87  SpO2: 97%  Weight: 256 lb 4 oz (116.2 kg)   Wt Readings from Last 3 Encounters:  12/02/16 256 lb 4 oz (116.2 kg)  11/18/16 260 lb 6.4 oz (118.1 kg)  11/13/16 260 lb (117.9 kg)    PHYSICAL EXAM: General: Elderly female, ambulated into clinic with walker.  Neck: JVP not elevated, neck thick. No thyromegaly or thyroid  nodule.  Lungs: CTAB  CV: Nondisplaced PMI.  Regular rate and rhythm. No murmurs. No carotid bruit.   Abdomen: Soft, nontender, no hepatosplenomegaly, no distention.  Skin: Intact without lesions, edema  Neurologic: Alert and oriented x 3.    Psych: Normal affect. Extremities: No clubbing or cyanosis. Bilateral lymphedema. No pitting edema HEENT: Normal.   ASSESSMENT & PLAN: 1. Chronic diastolic CHF:  Echo (3/73) with EF 55-60%, normal RV.  Has Cardiomems. PADP trending down on review of Cardiomems numbers today with extra metolazone, torsemide was also increased last week.  She is not volume overloaded. - Continue torsemide 80 qam/40 qpm. Continue metolazone twice a week. BMET today.  2. Lymphedema: Wear TED hose.   3. CKD: Stage 3.  Check BMET today. 4. Chest pain: LHC 2017 Nonobstructive.  Her chest heaviness seems to correlate with volume overload.       Loralie Champagne, 12/03/2016

## 2016-12-03 NOTE — Therapy (Signed)
[name] [place], [place], [address] Phone: [phone]   Fax:  [phone]  Physical Therapy Treatment  Patient Details  Name: [name] MRN: [id number] Date of Birth: [birth year] Referring Provider: Earlie Server, MD  Encounter Date: 12/03/2016      PT End of Session - 12/03/16 0953    Visit Number 15   Number of Visits 16   Date for PT Re-Evaluation 12/04/16   PT Start Time 0948   PT Stop Time 1044   PT Time Calculation (min) 56 min   Activity Tolerance Patient tolerated treatment well   Behavior During Therapy [name] for tasks assessed/performed      Past Medical History:  Diagnosis Date  . Anemia   . Anxiety   . Arthritis   . Brain lesion    2 types  . Cervical spondylosis with myelopathy   . Chest pain    Normal cardiac cath 5/09  . Congestive heart failure (CHF) ([place])   . Depression   . Dumping syndrome   . Edema   . Fatigue   . Fibromyalgia   . History of echocardiogram    a. Echo 03/20/16 (done at [facility] in [place], [place]):  mild LVH, EF 66%, normal diastolic function, mild LAE, MAC, RVSP 25 mmHg  . Hyperlipidemia   . Hypertension   . Hypothyroidism   . Lumbar spondylosis   . Lymphedema    seeing specialist for this  . Restless leg syndrome   . Rotator cuff injury   . Stroke [facility])    TIAs "mini strokes"- unsure of last TIA  . Syncope   . Tremors of nervous system     Past Surgical History:  Procedure Laterality Date  . APPENDECTOMY  1966  . CARDIAC CATHETERIZATION N/A 05/25/2016   Procedure: Right/Left Heart Cath and Coronary Angiography;  Surgeon: Larey Dresser, MD;  Location: [place] [facility];  Service: Cardiovascular;  Laterality: N/A;  . CHOLECYSTECTOMY  2015  . CHOLECYSTECTOMY  2015  . CORONARY ANGIOGRAM  2009   Normal coronaries  . Hip Bursa Surgery Bilateral   . KNEE ARTHROSCOPY Bilateral   . LUMBAR DISC SURGERY     x2  . LUMBAR FUSION    . RIGHT HEART CATH  N/A 11/13/2016   Procedure: Right Heart Cath with Cardiomems;  Surgeon: Larey Dresser, MD;  Location: [place] [facility];  Service: Cardiovascular;  Laterality: N/A;  . ROTATOR CUFF REPAIR Bilateral   . TOTAL ABDOMINAL HYSTERECTOMY    . TOTAL KNEE ARTHROPLASTY Left   . TOTAL KNEE ARTHROPLASTY Right 11/18/2012   Procedure: TOTAL KNEE ARTHROPLASTY;  Surgeon: Yvette Rack., MD;  Location: [place];  Service: Orthopedics;  Laterality: Right;    There were no vitals filed for this visit.      Subjective Assessment - 12/03/16 0952    Subjective Reports that she had a lot of cramps last night but thinks it may be due to missing a medication that helps with cramping. Reports that right now she is having difficulties with her heart and at times her back so that limits her as well.   Pertinent History Bilateral total knee replacements. Bilateral rotator cuff repairs   Limitations Walking   How long can you stand comfortably? Less than 5-10 minutes.   How long can you walk comfortably? < 5 min   Patient Stated Goals Get out of pain, walk better.    Currently in Pain? Yes  Pain Score 2    Pain Location Knee   Pain Orientation Right;Left   Pain Descriptors / Indicators Discomfort   Pain Type Chronic pain   Pain Onset More than a month ago   Pain Frequency Constant            [facility] PT Assessment - 12/03/16 0001      Assessment   Medical Diagnosis left knee pain   Next MD Visit 12/04/2016     Restrictions   Weight Bearing Restrictions No     Observation/Other Assessments   Focus on Therapeutic Outcomes (FOTO)  Initial limitation 68%, current 65%                     [facility] Adult PT Treatment/Exercise - 12/03/16 0001      Knee/Hip Exercises: Aerobic   Nustep L5 x15 min, seat 12  104 bpm after 15 min session     Knee/Hip Exercises: Standing   Other Standing Knee Exercises Sidestepping in // bars x4 RT     Knee/Hip Exercises: Seated   Long Arc Quad  Strengthening;Both;2 sets;10 reps;Weights   Long Arc Quad Weight 5 lbs.   Clamshell with Marga Hoots  x22 reps   Hamstring Curl Strengthening;Both;2 sets;10 reps   Hamstring Limitations green t-band   Sit to Sand Other (comment);with UE support  x12 reps but limited secondary to LBP     Knee/Hip Exercises: Supine   Straight Leg Raises Strengthening;Both;3 sets;10 reps     Modalities   Modalities Electrical Stimulation;Moist Heat     Moist Heat Therapy   Number Minutes Moist Heat 15 Minutes   Moist Heat Location Knee     Electrical Stimulation   Electrical Stimulation Location L medial and lateral knee   Electrical Stimulation Action Pre-Mod   Electrical Stimulation Parameters 80-150 hz x15 min   Electrical Stimulation Goals Pain                  PT Short Term Goals - 10/05/16 1037      PT SHORT TERM GOAL #1   Title STG's=LTG's.           PT Long Term Goals - 11/09/16 1136      PT LONG TERM GOAL #1   Title Independent with a HEP.   Time 8   Period Weeks   Status Partially Met     PT LONG TERM GOAL #2   Title Stand 20 minutes with left knee pain not > 3/10.   Baseline pt reporting 4-5/10 with doing dishes and standing for 20 minutes   Time 8   Period Weeks   Status On-going     PT LONG TERM GOAL #3   Title Perform ADL's with pain not > 3/10.   Baseline Pt reporting pain vaires with ADL's from 3-8/10.    Time 8   Period Weeks   Status On-going     PT LONG TERM GOAL #4   Title Pt to be walking 10 minutes twice a day to promote lymphatic circulation    Time 6   Period Weeks   Status On-going     PT LONG TERM GOAL #5   Title Pt to state she is able to walk for 10 minutes without difficulty ( pt limited to 10 minutes due to back issues)   Time 8   Period Weeks   Status On-going               Plan -  12/03/16 1037    Clinical Impression Statement Patient continues to present in clinic with low level B knee pain. Patient limited  slightly with exercises such as sit to stands due to LBP. Patient had no complaints with seated exercises as some were increased in resistance. Standing side step initiated today in parallel bars to build hip strength. SLR completed well although as fatigue increases extensor lag present. Normal modalities response noted following removal of the modalities.   Rehab Potential Good   PT Frequency 2x / week   PT Duration 8 weeks   PT Treatment/Interventions Iontophoresis 81m/ml Dexamethasone;Moist Heat;Patient/family education;Therapeutic exercise;Therapeutic activities;Manual techniques;Vasopneumatic Device;Dry needling   PT Next Visit Plan Sitting supported LE strengthening exercises if pt tolerates, progress supine LE strengthening exercises with incresaed ankle weights. Continue with sit to stands for weightbearing and stability.   PT Home Exercise Plan Instructed in TENS use, continue SLR, sit to stand, supine marching, LAQ   Consulted and Agree with Plan of Care Patient      Patient will benefit from skilled therapeutic intervention in order to improve the following deficits and impairments:  Decreased activity tolerance, Decreased strength, Pain, Postural dysfunction, Impaired perceived functional ability, Decreased balance, Decreased mobility  Visit Diagnosis: Difficulty in walking, not elsewhere classified  Chronic pain of left knee  Muscle weakness (generalized)     Problem List Patient Active Problem List   Diagnosis Date Noted  . Unstable angina ([place] 05/21/2016  . Chronic diastolic heart failure ([place] 03/29/2016  . Normal coronary arteries 2009 01/14/2015  . Obesity-BMI 40 01/14/2015  . Restless leg 01/14/2015  . Chest pain 01/14/2015  . Back pain 01/14/2015  . Renal insufficiency 01/14/2015  . Dementia- mild memory issues 01/14/2015  . Occult blood positive stool 12/14/2014  . Anemia 12/14/2014  . Family history of colon cancer 12/14/2014  . Change in bowel habits  12/14/2014  . GERD (gastroesophageal reflux disease) 12/14/2014  . Dyspnea 05/17/2012  . Lymphedema 05/17/2012  . Weight gain 05/17/2012  . HTN (hypertension) 05/17/2012    KWynelle Fanny PTA 12/03/2016, 11:19 AM  [facility]464 [address]MDrake [place] [address]Phone: [phone]  Fax:  [phone] Name: [name] CUTRONEMRN: [id number] of Birth: [birth year]

## 2016-12-03 NOTE — Progress Notes (Signed)
   Cardiomems Update  Implant Date: 11/13/2016   Creatinine: 2.12 BUN: 85     RHC 11/13/2016  RA mean 4 RV 30/8 PA 23/10, mean 15 PCWP mean 5 Oxygen saturations: PA 76% AO 97% Cardiac Output (Fick) 9.1  Cardiac Index (Fick) 4.1   Goal Range PAD: 10-17 mm hg  Current PAD: 11 mm hg    Outpatient Medications Prior to Visit  Medication Sig Dispense Refill  . alendronate (FOSAMAX) 70 MG tablet Take 70 mg by mouth every Friday. Take with a full glass of water on an empty stomach.     Marland Kitchen amLODipine (NORVASC) 10 MG tablet Take 1 tablet (10 mg total) by mouth daily. 30 tablet 6  . amoxicillin (AMOXIL) 500 MG capsule Take 2,000 mg by mouth as directed. Take 1 hour before dental procedure    . aspirin 81 MG tablet Take 81 mg by mouth at bedtime.    . baclofen (LIORESAL) 10 MG tablet Take 10 mg by mouth 3 (three) times daily.    . clopidogrel (PLAVIX) 75 MG tablet Take 1 tablet (75 mg total) by mouth daily. 30 tablet 0  . donepezil (ARICEPT) 10 MG tablet Take 10 mg by mouth at bedtime.     . DULoxetine (CYMBALTA) 20 MG capsule Take 1 capsule by mouth daily.    Marland Kitchen escitalopram (LEXAPRO) 20 MG tablet Take 30 mg by mouth daily with supper.     . fentaNYL (DURAGESIC - DOSED MCG/HR) 25 MCG/HR patch Place 25 mcg onto the skin every 3 (three) days.     . folic acid (FOLVITE) 1 MG tablet Take 1 mg by mouth 2 (two) times daily.    Marland Kitchen HYDROcodone-acetaminophen (NORCO) 7.5-325 MG tablet Take 1 tablet by mouth every 6 (six) hours as needed for moderate pain.    Marland Kitchen lamoTRIgine (LAMICTAL) 100 MG tablet Take 100 mg by mouth 2 (two) times daily.     . memantine (NAMENDA) 10 MG tablet Take 10 mg by mouth 2 (two) times daily.    . metolazone (ZAROXOLYN) 2.5 MG tablet Take 1 tablet (2.5 mg total) by mouth 2 (two) times a week. Every Tuesday and Saturday 15 tablet 6  . pravastatin (PRAVACHOL) 80 MG tablet Take 80 mg by mouth at bedtime.     . ranitidine (ZANTAC) 150 MG tablet Take 150 mg by mouth 2 (two) times  daily.    Marland Kitchen rOPINIRole (REQUIP) 1 MG tablet Take 1 mg by mouth 3 (three) times daily.    . sennosides-docusate sodium (SENOKOT-S) 8.6-50 MG tablet Take 2 tablets by mouth at bedtime.    Marland Kitchen spironolactone (ALDACTONE) 25 MG tablet Take 25 mg by mouth 2 (two) times daily.    Marland Kitchen torsemide (DEMADEX) 20 MG tablet Take 4 tabs in AM and 2 tabs in PM 180 tablet 3   No facility-administered medications prior to visit.     Lab Results  Component Value Date   CREATININE 2.79 (H) 12/02/2016   CREATININE 2.12 (H) 11/13/2016   CREATININE 2.08 (H) 11/10/2016    Recommendations:  Continue current plan. Repeat BMET in 1 week.   Amy Clegg NP-C  1:36 PM

## 2016-12-04 ENCOUNTER — Telehealth (HOSPITAL_COMMUNITY): Payer: Self-pay | Admitting: *Deleted

## 2016-12-04 DIAGNOSIS — I5022 Chronic systolic (congestive) heart failure: Secondary | ICD-10-CM

## 2016-12-04 NOTE — Telephone Encounter (Signed)
Basic metabolic panel  Order: [id number]  Status:  Final result Visible to patient:  No (Not Released) Dx:  Chronic diastolic heart failure ([place])  Notes recorded by Kennieth Rad, RN on 12/04/2016 at 2:23 PM EDT Patient called back and I have scheduled her BMET for Monday @ 10:30am. Lab order placed. ------  Notes recorded by Harvie Junior, [place] on 12/03/2016 at 3:58 PM EDT Pt aware of lab results will call me back to schedule appt ------  Notes recorded by Scarlette Calico, RN on 12/03/2016 at 3:56 PM EDT Left message to call back ------  Notes recorded by Harvie Junior, [place] on 12/03/2016 at 3:15 PM EDT Left VM for pt to call for lab results ------  Notes recorded by Larey Dresser, MD on 12/02/2016 at 10:11 PM EDT Need to repeat BMET on Monday to make sure creatinine does not continue to trend up.

## 2016-12-07 ENCOUNTER — Ambulatory Visit: Payer: Medicare Other | Admitting: Physical Therapy

## 2016-12-07 ENCOUNTER — Inpatient Hospital Stay (HOSPITAL_COMMUNITY): Admission: RE | Admit: 2016-12-07 | Payer: Medicare Other | Source: Ambulatory Visit

## 2016-12-07 ENCOUNTER — Encounter: Payer: Self-pay | Admitting: Physical Therapy

## 2016-12-07 ENCOUNTER — Encounter: Payer: Medicare Other | Admitting: Physical Therapy

## 2016-12-07 DIAGNOSIS — M25562 Pain in left knee: Secondary | ICD-10-CM

## 2016-12-07 DIAGNOSIS — M6281 Muscle weakness (generalized): Secondary | ICD-10-CM

## 2016-12-07 DIAGNOSIS — G8929 Other chronic pain: Secondary | ICD-10-CM

## 2016-12-07 DIAGNOSIS — R262 Difficulty in walking, not elsewhere classified: Secondary | ICD-10-CM

## 2016-12-07 NOTE — Therapy (Signed)
[facility] [place], [place], [address] Phone: [phone]   Fax:  [phone]  Physical Therapy Treatment  Patient Details  Name: [name] MRN: [id number] Date of Birth: [birth year] Referring Provider: Earlie Server, MD  Encounter Date: 12/07/2016      PT End of Session - 12/07/16 0955    Visit Number 16   Number of Visits 16   Date for PT Re-Evaluation 12/04/16   PT Start Time 0954   PT Stop Time 1045   PT Time Calculation (min) 51 min   Activity Tolerance Patient tolerated treatment well   Behavior During Therapy [facility] for tasks assessed/performed      Past Medical History:  Diagnosis Date  . Anemia   . Anxiety   . Arthritis   . Brain lesion    2 types  . Cervical spondylosis with myelopathy   . Chest pain    Normal cardiac cath 5/09  . Congestive heart failure (CHF) ([place])   . Depression   . Dumping syndrome   . Edema   . Fatigue   . Fibromyalgia   . History of echocardiogram    a. Echo 03/20/16 (done at [facility] in [place], [place]):  mild LVH, EF 31%, normal diastolic function, mild LAE, MAC, RVSP 25 mmHg  . Hyperlipidemia   . Hypertension   . Hypothyroidism   . Lumbar spondylosis   . Lymphedema    seeing specialist for this  . Restless leg syndrome   . Rotator cuff injury   . Stroke [facility])    TIAs "mini strokes"- unsure of last TIA  . Syncope   . Tremors of nervous system     Past Surgical History:  Procedure Laterality Date  . APPENDECTOMY  1966  . CARDIAC CATHETERIZATION N/A 05/25/2016   Procedure: Right/Left Heart Cath and Coronary Angiography;  Surgeon: Larey Dresser, MD;  Location: [place] [facility];  Service: Cardiovascular;  Laterality: N/A;  . CHOLECYSTECTOMY  2015  . CHOLECYSTECTOMY  2015  . CORONARY ANGIOGRAM  2009   Normal coronaries  . Hip Bursa Surgery Bilateral   . KNEE ARTHROSCOPY Bilateral   . LUMBAR DISC SURGERY     x2  . LUMBAR FUSION    . RIGHT HEART CATH  N/A 11/13/2016   Procedure: Right Heart Cath with Cardiomems;  Surgeon: Larey Dresser, MD;  Location: [place] [facility];  Service: Cardiovascular;  Laterality: N/A;  . ROTATOR CUFF REPAIR Bilateral   . TOTAL ABDOMINAL HYSTERECTOMY    . TOTAL KNEE ARTHROPLASTY Left   . TOTAL KNEE ARTHROPLASTY Right 11/18/2012   Procedure: TOTAL KNEE ARTHROPLASTY;  Surgeon: Yvette Rack., MD;  Location: [facility];  Service: Orthopedics;  Laterality: Right;    There were no vitals filed for this visit.      Subjective Assessment - 12/07/16 0954    Subjective Reports that she was unable to go to appointment secondary to MD having an emergency but he told her that if paperwork was sent to him then he would send it back. Reports LE stiffness this morning.   Pertinent History Bilateral total knee replacements. Bilateral rotator cuff repairs   Limitations Walking   How long can you stand comfortably? Less than 5-10 minutes.   How long can you walk comfortably? < 5 min   Patient Stated Goals Get out of pain, walk better.    Currently in Pain? Other (Comment)  Reported soreness and stiffness but no pain assessment  provided            [facility] PT Assessment - 12/07/16 0001      Assessment   Medical Diagnosis left knee pain   Next MD Visit 12/11/2016     Restrictions   Weight Bearing Restrictions No                     [facility] Adult PT Treatment/Exercise - 12/07/16 0001      Knee/Hip Exercises: Aerobic   Nustep L5 x15 min, seat 12     Knee/Hip Exercises: Standing   Other Standing Knee Exercises Sidestepping in // bars x4 RT     Knee/Hip Exercises: Seated   Long Arc Quad Strengthening;Both;2 sets;10 reps;Weights   Long Arc Quad Weight 5 lbs.   Marching Limitations 2x10 with 5# bil   Hamstring Curl Strengthening;Both  x25 reps   Hamstring Limitations green t-band   Sit to Sand 20 reps;with UE support     Knee/Hip Exercises: Supine   Straight Leg Raises Strengthening;Both;2 sets;10 reps    Other Supine Knee/Hip Exercises Supine marching BLE x20 reps with core activation     Modalities   Modalities Electrical Stimulation;Moist Heat     Moist Heat Therapy   Number Minutes Moist Heat 15 Minutes   Moist Heat Location Knee     Electrical Stimulation   Electrical Stimulation Location L medial and lateral knee   Electrical Stimulation Action Pre-Mod   Electrical Stimulation Parameters 80-150 hz x`5 min   Electrical Stimulation Goals Pain                  PT Short Term Goals - 10/05/16 1037      PT SHORT TERM GOAL #1   Title STG's=LTG's.           PT Long Term Goals - 12/07/16 1033      PT LONG TERM GOAL #1   Title Independent with a HEP.   Time 8   Period Weeks   Status Partially Met     PT LONG TERM GOAL #2   Title Stand 20 minutes with left knee pain not > 3/10.   Baseline pt reporting 4-5/10 with doing dishes and standing for 20 minutes   Time 8   Period Weeks   Status On-going     PT LONG TERM GOAL #3   Title Perform ADL's with pain not > 3/10.   Baseline Pt reporting pain vaires with ADL's from 3-8/10.    Time 8   Period Weeks   Status On-going     PT LONG TERM GOAL #4   Title Pt to be walking 10 minutes twice a day to promote lymphatic circulation    Time 6   Period Weeks   Status On-going     PT LONG TERM GOAL #5   Title Pt to state she is able to walk for 10 minutes without difficulty ( pt limited to 10 minutes due to back issues)   Time 8   Period Weeks   Status On-going               Plan - 12/07/16 1038    Clinical Impression Statement Patient continues to do well with seated exercises and supine exercises with no increase in pain reported. Patient required a short rest period after 10 minutes on NuStep today due to fatigue. Shufling gait and poor foot clearance observed with ambulation. Core activation incorporated during supine exercises to assist with standing. No  extensor lag noted with SLR today although only  two sets of ten were completed. Goals remain on-going secondary to pain with activity or standing. Normal modalities response noted following removal of the modalities.   Rehab Potential Good   PT Frequency 2x / week   PT Duration 8 weeks   PT Treatment/Interventions Iontophoresis 65m/ml Dexamethasone;Moist Heat;Patient/family education;Therapeutic exercise;Therapeutic activities;Manual techniques;Vasopneumatic Device;Dry needling   PT Next Visit Plan Sitting supported LE strengthening exercises if pt tolerates, progress supine LE strengthening exercises with incresaed ankle weights. Continue with sit to stands for weightbearing and stability.   PT Home Exercise Plan Instructed in TENS use, continue SLR, sit to stand, supine marching, LAQ   Consulted and Agree with Plan of Care Patient      Patient will benefit from skilled therapeutic intervention in order to improve the following deficits and impairments:  Decreased activity tolerance, Decreased strength, Pain, Postural dysfunction, Impaired perceived functional ability, Decreased balance, Decreased mobility  Visit Diagnosis: Difficulty in walking, not elsewhere classified  Chronic pain of left knee  Muscle weakness (generalized)     Problem List Patient Active Problem List   Diagnosis Date Noted  . Unstable angina ([place] 05/21/2016  . Chronic diastolic heart failure ([place] 03/29/2016  . Normal coronary arteries 2009 01/14/2015  . Obesity-BMI 40 01/14/2015  . Restless leg 01/14/2015  . Chest pain 01/14/2015  . Back pain 01/14/2015  . Renal insufficiency 01/14/2015  . Dementia- mild memory issues 01/14/2015  . Occult blood positive stool 12/14/2014  . Anemia 12/14/2014  . Family history of colon cancer 12/14/2014  . Change in bowel habits 12/14/2014  . GERD (gastroesophageal reflux disease) 12/14/2014  . Dyspnea 05/17/2012  . Lymphedema 05/17/2012  . Weight gain 05/17/2012  . HTN (hypertension) 05/17/2012    KAhmed Prima PTA 12/07/16 11:08 AM  [facility] [address] [address]Phone: [phone]  Fax:  [phone] Name: [name] KUNSTMRN: [id number] of Birth: 611/28/1947

## 2016-12-08 ENCOUNTER — Ambulatory Visit (HOSPITAL_COMMUNITY)
Admission: RE | Admit: 2016-12-08 | Discharge: 2016-12-08 | Disposition: A | Discharge status: Home / Self Care | Payer: Medicare Other | Source: Ambulatory Visit | Attending: Internal Medicine | Admitting: Internal Medicine

## 2016-12-08 ENCOUNTER — Telehealth (HOSPITAL_COMMUNITY): Payer: Self-pay | Admitting: *Deleted

## 2016-12-08 ENCOUNTER — Other Ambulatory Visit (HOSPITAL_COMMUNITY): Payer: Self-pay | Admitting: *Deleted

## 2016-12-08 DIAGNOSIS — I5032 Chronic diastolic (congestive) heart failure: Secondary | ICD-10-CM | POA: Diagnosis present

## 2016-12-08 DIAGNOSIS — Z1231 Encounter for screening mammogram for malignant neoplasm of breast: Secondary | ICD-10-CM

## 2016-12-08 LAB — BASIC METABOLIC PANEL
ANION GAP: 14 (ref 5–15)
BUN: 100 mg/dL — ABNORMAL HIGH (ref 6–20)
CHLORIDE: 90 mmol/L — AB (ref 101–111)
CO2: 31 mmol/L (ref 22–32)
CREATININE: 3.07 mg/dL — AB (ref 0.44–1.00)
Calcium: 10.8 mg/dL — ABNORMAL HIGH (ref 8.9–10.3)
GFR calc Af Amer: 17 mL/min — ABNORMAL LOW (ref >60)
GFR calc non Af Amer: 14 mL/min — ABNORMAL LOW (ref >60)
Glucose, Bld: 110 mg/dL — ABNORMAL HIGH (ref 65–99)
POTASSIUM: 4.4 mmol/L (ref 3.5–5.1)
Sodium: 135 mmol/L (ref 135–145)

## 2016-12-08 NOTE — Telephone Encounter (Signed)
Opened in error

## 2016-12-09 ENCOUNTER — Ambulatory Visit: Payer: Medicare Other | Admitting: Physical Therapy

## 2016-12-09 ENCOUNTER — Encounter: Payer: Self-pay | Admitting: Physical Therapy

## 2016-12-09 ENCOUNTER — Telehealth (HOSPITAL_COMMUNITY): Payer: Self-pay | Admitting: *Deleted

## 2016-12-09 DIAGNOSIS — M6281 Muscle weakness (generalized): Secondary | ICD-10-CM

## 2016-12-09 DIAGNOSIS — R262 Difficulty in walking, not elsewhere classified: Secondary | ICD-10-CM

## 2016-12-09 DIAGNOSIS — M25562 Pain in left knee: Secondary | ICD-10-CM

## 2016-12-09 DIAGNOSIS — G8929 Other chronic pain: Secondary | ICD-10-CM

## 2016-12-09 MED ORDER — TORSEMIDE 20 MG PO TABS: 80.0000 mg | ORAL_TABLET | Freq: Every day | ORAL | 3 refills | Status: DC

## 2016-12-09 MED ORDER — METOLAZONE 2.5 MG PO TABS: 2.5000 mg | ORAL_TABLET | ORAL | 3 refills | Status: DC

## 2016-12-09 NOTE — Telephone Encounter (Signed)
Basic metabolic panel  Order: [id number]  Status:  Final result Visible to patient:  No (Not Released) Dx:  Chronic diastolic heart failure ([place])  Notes recorded by Kennieth Rad, RN on 12/09/2016 at 2:36 PM EDT Patient's daughter called back to confirm medication changes. Daughter is aware and no further questions. ------  Notes recorded by Harvie Junior, [place] on 12/09/2016 at 1:49 PM EDT Spoke with patients daughter she is aware of results and medication change. ------  Notes recorded by Larey Dresser, MD on 12/08/2016 at 4:42 PM EDT Hold torsemide x 2 days. Decrease torsemide back to 80 mg daily. Hold metolazone x 1 week then decrease to once a week.

## 2016-12-09 NOTE — Therapy (Signed)
[facility] [place], [place], [address] Phone: [phone]   Fax:  [phone]  Physical Therapy Treatment  Patient Details  Name: [name] MRN: [id number] Date of Birth: Oct 23, 1945 Referring Provider: Earlie Server, MD  Encounter Date: 12/09/2016      PT End of Session - 12/09/16 0903    Visit Number 17   Number of Visits 30   Date for PT Re-Evaluation 01/20/17   PT Start Time 0904   PT Stop Time 1008   PT Time Calculation (min) 64 min   Activity Tolerance Patient tolerated treatment well   Behavior During Therapy [facility] for tasks assessed/performed      Past Medical History:  Diagnosis Date  . Anemia   . Anxiety   . Arthritis   . Brain lesion    2 types  . Cervical spondylosis with myelopathy   . Chest pain    Normal cardiac cath 5/09  . Congestive heart failure (CHF) ([place])   . Depression   . Dumping syndrome   . Edema   . Fatigue   . Fibromyalgia   . History of echocardiogram    a. Echo 03/20/16 (done at [facility] in [place], [place]):  mild LVH, EF 44%, normal diastolic function, mild LAE, MAC, RVSP 25 mmHg  . Hyperlipidemia   . Hypertension   . Hypothyroidism   . Lumbar spondylosis   . Lymphedema    seeing specialist for this  . Restless leg syndrome   . Rotator cuff injury   . Stroke [facility])    TIAs "mini strokes"- unsure of last TIA  . Syncope   . Tremors of nervous system     Past Surgical History:  Procedure Laterality Date  . APPENDECTOMY  1966  . CARDIAC CATHETERIZATION N/A 05/25/2016   Procedure: Right/Left Heart Cath and Coronary Angiography;  Surgeon: Larey Dresser, MD;  Location: [place] [facility];  Service: Cardiovascular;  Laterality: N/A;  . CHOLECYSTECTOMY  2015  . CHOLECYSTECTOMY  2015  . CORONARY ANGIOGRAM  2009   Normal coronaries  . Hip Bursa Surgery Bilateral   . KNEE ARTHROSCOPY Bilateral   . LUMBAR DISC SURGERY     x2  . LUMBAR FUSION    . RIGHT HEART CATH  N/A 11/13/2016   Procedure: Right Heart Cath with Cardiomems;  Surgeon: Larey Dresser, MD;  Location: [place] [facility];  Service: Cardiovascular;  Laterality: N/A;  . ROTATOR CUFF REPAIR Bilateral   . TOTAL ABDOMINAL HYSTERECTOMY    . TOTAL KNEE ARTHROPLASTY Left   . TOTAL KNEE ARTHROPLASTY Right 11/18/2012   Procedure: TOTAL KNEE ARTHROPLASTY;  Surgeon: Yvette Rack., MD;  Location: [place];  Service: Orthopedics;  Laterality: Right;    There were no vitals filed for this visit.      Subjective Assessment - 12/09/16 0903    Subjective Reports that her legs still feel like they are weighted. Patient also requests to try sit to stands prior to weighted exercises.   Pertinent History Bilateral total knee replacements. Bilateral rotator cuff repairs   Limitations Walking   How long can you stand comfortably? Less than 5-10 minutes.   How long can you walk comfortably? < 5 min   Patient Stated Goals Get out of pain, walk better.    Currently in Pain? Yes   Pain Score --  No pain score reported   Pain Location Leg   Pain Orientation Right;Left   Pain Descriptors /  Indicators Heaviness   Pain Type Chronic pain   Pain Onset More than a month ago            [facility] PT Assessment - 12/09/16 0001      Assessment   Medical Diagnosis left knee pain   Next MD Visit 12/11/2016     Restrictions   Weight Bearing Restrictions No                     [facility] Adult PT Treatment/Exercise - 12/09/16 0001      Knee/Hip Exercises: Aerobic   Nustep L5 x18 min, seat 10     Knee/Hip Exercises: Standing   Other Standing Knee Exercises Sidestepping in // bars x8 RT     Knee/Hip Exercises: Seated   Long Arc Quad Strengthening;Both;3 sets;10 reps;Weights   Long Arc Quad Weight 5 lbs.   Clamshell with TheraBand Green  x30 reps   Hamstring Curl Strengthening;Both;3 sets;10 reps   Hamstring Limitations green t-band   Sit to Sand with UE support  x30 reps     Modalities    Modalities Electrical Stimulation;Cryotherapy     Cryotherapy   Number Minutes Cryotherapy 15 Minutes   Cryotherapy Location Knee   Type of Cryotherapy Ice pack  two pillowcases and pants leg for layering     Electrical Stimulation   Electrical Stimulation Location L medial and lateral knee   Electrical Stimulation Action Pre-Mod   Electrical Stimulation Parameters 80-150 hz x15 min   Electrical Stimulation Goals Pain                  PT Short Term Goals - 10/05/16 1037      PT SHORT TERM GOAL #1   Title STG's=LTG's.           PT Long Term Goals - 12/07/16 1033      PT LONG TERM GOAL #1   Title Independent with a HEP.   Time 8   Period Weeks   Status Partially Met     PT LONG TERM GOAL #2   Title Stand 20 minutes with left knee pain not > 3/10.   Baseline pt reporting 4-5/10 with doing dishes and standing for 20 minutes   Time 8   Period Weeks   Status On-going     PT LONG TERM GOAL #3   Title Perform ADL's with pain not > 3/10.   Baseline Pt reporting pain vaires with ADL's from 3-8/10.    Time 8   Period Weeks   Status On-going     PT LONG TERM GOAL #4   Title Pt to be walking 10 minutes twice a day to promote lymphatic circulation    Time 6   Period Weeks   Status On-going     PT LONG TERM GOAL #5   Title Pt to state she is able to walk for 10 minutes without difficulty ( pt limited to 10 minutes due to back issues)   Time 8   Period Weeks   Status On-going               Plan - 12/09/16 1003    Clinical Impression Statement Patient did very well during treatment today and was able to do more exercise in standing as well as longer period of time on Nustep without complaint. Patient has done very well with advancement in reps as well as resistance. Patient used very minimal UE assist for sit to stands from low plinth  height. Patient also able to ambulate between gym and treatment room without rollator and with minimal discomfort from  exercise noted during ambulation. Cryotherapy requested by patient as she felt hot following therex with multiple layers as to not affect lymph flow with extreme temp change. Patient very pleased with cryotherapy treatment during electrical stimulation.   Rehab Potential Good   PT Frequency 2x / week   PT Duration 8 weeks   PT Treatment/Interventions Iontophoresis 10m/ml Dexamethasone;Moist Heat;Patient/family education;Therapeutic exercise;Therapeutic activities;Manual techniques;Vasopneumatic Device;Dry needling   PT Next Visit Plan Sitting supported LE strengthening exercises if pt tolerates, progress supine LE strengthening exercises with incresaed ankle weights. Continue with sit to stands for weightbearing and stability.   PT Home Exercise Plan Instructed in TENS use, continue SLR, sit to stand, supine marching, LAQ   Consulted and Agree with Plan of Care Patient      Patient will benefit from skilled therapeutic intervention in order to improve the following deficits and impairments:  Decreased activity tolerance, Decreased strength, Pain, Postural dysfunction, Impaired perceived functional ability, Decreased balance, Decreased mobility  Visit Diagnosis: Difficulty in walking, not elsewhere classified  Chronic pain of left knee  Muscle weakness (generalized)  Difficulty walking     Problem List Patient Active Problem List   Diagnosis Date Noted  . Unstable angina ([place] 05/21/2016  . Chronic diastolic heart failure ([place] 03/29/2016  . Normal coronary arteries 2009 01/14/2015  . Obesity-BMI 40 01/14/2015  . Restless leg 01/14/2015  . Chest pain 01/14/2015  . Back pain 01/14/2015  . Renal insufficiency 01/14/2015  . Dementia- mild memory issues 01/14/2015  . Occult blood positive stool 12/14/2014  . Anemia 12/14/2014  . Family history of colon cancer 12/14/2014  . Change in bowel habits 12/14/2014  . GERD (gastroesophageal reflux disease) 12/14/2014  . Dyspnea 05/17/2012   . Lymphedema 05/17/2012  . Weight gain 05/17/2012  . HTN (hypertension) 05/17/2012    KWynelle Fanny PTA 12/09/2016, 10:16 AM  [facility] [place] [address]Phone: [phone]  Fax:  [phone] Name: CALBERTIA CARVINMRN: [id number] of Birth: 6April 30, 1947

## 2016-12-09 NOTE — Telephone Encounter (Signed)
Notes recorded by Harvie Junior, [place] on 12/09/2016 at 1:49 PM EDT Spoke with patients daughter she is aware of results and medication change. ------  Notes recorded by Larey Dresser, MD on 12/08/2016 at 4:42 PM EDT Hold torsemide x 2 days. Decrease torsemide back to 80 mg daily. Hold metolazone x 1 week then decrease to once a week.    Ref Range & Units 1d ago 7d ago 3wk ago   Sodium 135 - 145 mmol/L 135  137  139    Potassium 3.5 - 5.1 mmol/L 4.4  4.0CM  3.8    Chloride 101 - 111 mmol/L 90   92   98     CO2 22 - 32 mmol/L 31  27  30     Glucose, Bld 65 - 99 mg/dL 110   110   98    BUN 6 - 20 mg/dL 100   85   58     Creatinine, Ser 0.44 - 1.00 mg/dL 3.07   2.79   2.12     Calcium 8.9 - 10.3 mg/dL 10.8   10.8   9.4    GFR calc non Af Amer >60 mL/min 14   16   22      GFR calc Af Amer >60 mL/min 17   19CM   26CM

## 2016-12-10 ENCOUNTER — Encounter: Payer: Medicare Other | Admitting: Physical Therapy

## 2016-12-15 ENCOUNTER — Other Ambulatory Visit (HOSPITAL_COMMUNITY): Payer: Self-pay | Admitting: Cardiology

## 2016-12-15 ENCOUNTER — Ambulatory Visit: Payer: Medicare Other | Admitting: Physical Therapy

## 2016-12-15 ENCOUNTER — Encounter: Payer: Self-pay | Admitting: Physical Therapy

## 2016-12-15 DIAGNOSIS — G8929 Other chronic pain: Secondary | ICD-10-CM

## 2016-12-15 DIAGNOSIS — M6281 Muscle weakness (generalized): Secondary | ICD-10-CM

## 2016-12-15 DIAGNOSIS — R262 Difficulty in walking, not elsewhere classified: Secondary | ICD-10-CM

## 2016-12-15 DIAGNOSIS — M25562 Pain in left knee: Secondary | ICD-10-CM

## 2016-12-15 NOTE — Therapy (Signed)
[facility] [place], [place], [address] Phone: [phone]   Fax:  [phone]  Physical Therapy Treatment  Patient Details  Name: [name] MRN: [id number] Date of Birth: 04-04-1946 Referring Provider: Earlie Server, MD  Encounter Date: 12/15/2016      PT End of Session - 12/15/16 1038    Visit Number 18   Number of Visits 30   Date for PT Re-Evaluation 01/20/17   PT Start Time 1033   PT Stop Time 1128   PT Time Calculation (min) 55 min   Activity Tolerance Patient tolerated treatment well   Behavior During Therapy [facility] for tasks assessed/performed      Past Medical History:  Diagnosis Date  . Anemia   . Anxiety   . Arthritis   . Brain lesion    2 types  . Cervical spondylosis with myelopathy   . Chest pain    Normal cardiac cath 5/09  . Congestive heart failure (CHF) ([place])   . Depression   . Dumping syndrome   . Edema   . Fatigue   . Fibromyalgia   . History of echocardiogram    a. Echo 03/20/16 (done at [facility] in [place], [place]):  mild LVH, EF 13%, normal diastolic function, mild LAE, MAC, RVSP 25 mmHg  . Hyperlipidemia   . Hypertension   . Hypothyroidism   . Lumbar spondylosis   . Lymphedema    seeing specialist for this  . Restless leg syndrome   . Rotator cuff injury   . Stroke [facility])    TIAs "mini strokes"- unsure of last TIA  . Syncope   . Tremors of nervous system     Past Surgical History:  Procedure Laterality Date  . APPENDECTOMY  1966  . CARDIAC CATHETERIZATION N/A 05/25/2016   Procedure: Right/Left Heart Cath and Coronary Angiography;  Surgeon: Larey Dresser, MD;  Location: [place] [facility];  Service: Cardiovascular;  Laterality: N/A;  . CHOLECYSTECTOMY  2015  . CHOLECYSTECTOMY  2015  . CORONARY ANGIOGRAM  2009   Normal coronaries  . Hip Bursa Surgery Bilateral   . KNEE ARTHROSCOPY Bilateral   . LUMBAR DISC SURGERY     x2  . LUMBAR FUSION    . RIGHT HEART CATH  N/A 11/13/2016   Procedure: Right Heart Cath with Cardiomems;  Surgeon: Larey Dresser, MD;  Location: [place] [facility];  Service: Cardiovascular;  Laterality: N/A;  . ROTATOR CUFF REPAIR Bilateral   . TOTAL ABDOMINAL HYSTERECTOMY    . TOTAL KNEE ARTHROPLASTY Left   . TOTAL KNEE ARTHROPLASTY Right 11/18/2012   Procedure: TOTAL KNEE ARTHROPLASTY;  Surgeon: Yvette Rack., MD;  Location: [facility];  Service: Orthopedics;  Laterality: Right;    There were no vitals filed for this visit.      Subjective Assessment - 12/15/16 1036    Subjective No issues with her knees over the weekend  but was taken off fluid pill but is now on them again.   Pertinent History Bilateral total knee replacements. Bilateral rotator cuff repairs   Limitations Walking   How long can you stand comfortably? Less than 5-10 minutes.   How long can you walk comfortably? < 5 min   Patient Stated Goals Get out of pain, walk better.    Currently in Pain? No/denies            [facility] PT Assessment - 12/15/16 0001      Assessment   Medical  Diagnosis left knee pain   Next MD Visit 12/11/2016     Restrictions   Weight Bearing Restrictions No                     [facility] Adult PT Treatment/Exercise - 12/15/16 0001      Knee/Hip Exercises: Aerobic   Nustep L6 x6 min     Knee/Hip Exercises: Seated   Long Arc Quad Strengthening;Both;3 sets;10 reps;Weights   Long Arc Quad Weight 5 lbs.   Clamshell with TheraBand Green  3x10 reps   Marching Limitations 2x10 with 5# bil   Hamstring Curl Strengthening;Both;3 sets;10 reps   Hamstring Limitations green t-band   Sit to Sand 20 reps;without UE support     Modalities   Modalities Electrical Stimulation;Moist Heat     Moist Heat Therapy   Number Minutes Moist Heat 15 Minutes   Moist Heat Location Knee     Electrical Stimulation   Electrical Stimulation Location L knee   Electrical Stimulation Action IFC   Electrical Stimulation Parameters 1-10 hz x15  min   Electrical Stimulation Goals Pain                  PT Short Term Goals - 10/05/16 1037      PT SHORT TERM GOAL #1   Title STG's=LTG's.           PT Long Term Goals - 12/07/16 1033      PT LONG TERM GOAL #1   Title Independent with a HEP.   Time 8   Period Weeks   Status Partially Met     PT LONG TERM GOAL #2   Title Stand 20 minutes with left knee pain not > 3/10.   Baseline pt reporting 4-5/10 with doing dishes and standing for 20 minutes   Time 8   Period Weeks   Status On-going     PT LONG TERM GOAL #3   Title Perform ADL's with pain not > 3/10.   Baseline Pt reporting pain vaires with ADL's from 3-8/10.    Time 8   Period Weeks   Status On-going     PT LONG TERM GOAL #4   Title Pt to be walking 10 minutes twice a day to promote lymphatic circulation    Time 6   Period Weeks   Status On-going     PT LONG TERM GOAL #5   Title Pt to state she is able to walk for 10 minutes without difficulty ( pt limited to 10 minutes due to [name] issues)   Time 8   Period Weeks   Status On-going               Plan - 12/15/16 1124    Clinical Impression Statement Patient tolerated today's treatment very well as she was provided cueing to prolong hold of LAQ to further improve Quad strength. Patient able to complete all seated exercises well with no complaints. Patient able to complete sit to stands following exercise without UE support well. Increased resistance on Nustep today completed for six minutes then was stopped for bathroom break. Normal modalities response noted following removal of the modalities.   Rehab Potential Good   PT Frequency 2x / week   PT Duration 8 weeks   PT Treatment/Interventions Iontophoresis 66m/ml Dexamethasone;Moist Heat;Patient/family education;Therapeutic exercise;Therapeutic activities;Manual techniques;Vasopneumatic Device;Dry needling;Cryotherapy   PT Next Visit Plan Sitting supported LE strengthening exercises if pt  tolerates, progress supine LE strengthening exercises with incresaed ankle  weights. Continue with sit to stands for weightbearing and stability.   PT Home Exercise Plan Instructed in TENS use, continue SLR, sit to stand, supine marching, LAQ   Consulted and Agree with Plan of Care Patient      Patient will benefit from skilled therapeutic intervention in order to improve the following deficits and impairments:  Decreased activity tolerance, Decreased strength, Pain, Postural dysfunction, Impaired perceived functional ability, Decreased balance, Decreased mobility  Visit Diagnosis: Difficulty in walking, not elsewhere classified  Chronic pain of left knee  Muscle weakness (generalized)     Problem List Patient Active Problem List   Diagnosis Date Noted  . Unstable angina ([place]) 05/21/2016  . Chronic diastolic heart failure ([place]) 03/29/2016  . Normal coronary arteries 2009 01/14/2015  . Obesity-BMI 40 01/14/2015  . Restless leg 01/14/2015  . Chest pain 01/14/2015  . [name] pain 01/14/2015  . Renal insufficiency 01/14/2015  . Dementia- mild memory issues 01/14/2015  . Occult blood positive stool 12/14/2014  . Anemia 12/14/2014  . Family history of colon cancer 12/14/2014  . Change in bowel habits 12/14/2014  . GERD (gastroesophageal reflux disease) 12/14/2014  . Dyspnea 05/17/2012  . Lymphedema 05/17/2012  . Weight gain 05/17/2012  . HTN (hypertension) 05/17/2012    Wynelle Fanny, PTA 12/15/2016, 12:09 PM  [facility] [address] [place], [place], [address] Phone: [phone]   Fax:  [phone]  Name: [name] MRN: [id number] Date of Birth: 05/13/1946

## 2016-12-17 ENCOUNTER — Encounter: Payer: Self-pay | Admitting: Physical Therapy

## 2016-12-17 ENCOUNTER — Ambulatory Visit: Payer: Medicare Other | Admitting: Physical Therapy

## 2016-12-17 DIAGNOSIS — M6281 Muscle weakness (generalized): Secondary | ICD-10-CM | POA: Diagnosis not present

## 2016-12-17 DIAGNOSIS — G8929 Other chronic pain: Secondary | ICD-10-CM

## 2016-12-17 DIAGNOSIS — M25562 Pain in left knee: Secondary | ICD-10-CM

## 2016-12-17 DIAGNOSIS — R262 Difficulty in walking, not elsewhere classified: Secondary | ICD-10-CM

## 2016-12-17 NOTE — Therapy (Signed)
[facility] [place], [place], [address] Phone: [phone]   Fax:  [phone]  Physical Therapy Treatment  Patient Details  Name: [name] MRN: [id number] Date of Birth: 28-Nov-1945 Referring Provider: Earlie Server, MD  Encounter Date: 12/17/2016      PT End of Session - 12/17/16 1048    Visit Number 19   Number of Visits 30   Date for PT Re-Evaluation 01/20/17   PT Start Time 1032   PT Stop Time 1125   PT Time Calculation (min) 53 min   Activity Tolerance Patient tolerated treatment well   Behavior During Therapy [facility] for tasks assessed/performed      Past Medical History:  Diagnosis Date  . Anemia   . Anxiety   . Arthritis   . Brain lesion    2 types  . Cervical spondylosis with myelopathy   . Chest pain    Normal cardiac cath 5/09  . Congestive heart failure (CHF) ([place])   . Depression   . Dumping syndrome   . Edema   . Fatigue   . Fibromyalgia   . History of echocardiogram    a. Echo 03/20/16 (done at [facility] in [place], [place]):  mild LVH, EF 51%, normal diastolic function, mild LAE, MAC, RVSP 25 mmHg  . Hyperlipidemia   . Hypertension   . Hypothyroidism   . Lumbar spondylosis   . Lymphedema    seeing specialist for this  . Restless leg syndrome   . Rotator cuff injury   . Stroke [facility])    TIAs "mini strokes"- unsure of last TIA  . Syncope   . Tremors of nervous system     Past Surgical History:  Procedure Laterality Date  . APPENDECTOMY  1966  . CARDIAC CATHETERIZATION N/A 05/25/2016   Procedure: Right/Left Heart Cath and Coronary Angiography;  Surgeon: Larey Dresser, MD;  Location: [place] [facility];  Service: Cardiovascular;  Laterality: N/A;  . CHOLECYSTECTOMY  2015  . CHOLECYSTECTOMY  2015  . CORONARY ANGIOGRAM  2009   Normal coronaries  . Hip Bursa Surgery Bilateral   . KNEE ARTHROSCOPY Bilateral   . LUMBAR DISC SURGERY     x2  . LUMBAR FUSION    . RIGHT HEART CATH  N/A 11/13/2016   Procedure: Right Heart Cath with Cardiomems;  Surgeon: Larey Dresser, MD;  Location: [place] [facility];  Service: Cardiovascular;  Laterality: N/A;  . ROTATOR CUFF REPAIR Bilateral   . TOTAL ABDOMINAL HYSTERECTOMY    . TOTAL KNEE ARTHROPLASTY Left   . TOTAL KNEE ARTHROPLASTY Right 11/18/2012   Procedure: TOTAL KNEE ARTHROPLASTY;  Surgeon: Yvette Rack., MD;  Location: [place];  Service: Orthopedics;  Laterality: Right;    There were no vitals filed for this visit.      Subjective Assessment - 12/17/16 1034    Subjective Reports that she has her normal LBP today.    Pertinent History Bilateral total knee replacements. Bilateral rotator cuff repairs   Limitations Walking   How long can you stand comfortably? Less than 5-10 minutes.   How long can you walk comfortably? < 5 min   Patient Stated Goals Get out of pain, walk better.    Currently in Pain? Other (Comment)  "Normal" LBP            [facility] PT Assessment - 12/17/16 0001      Assessment   Medical Diagnosis left knee pain   Next MD  Visit None presently     Restrictions   Weight Bearing Restrictions No                     [facility] Adult PT Treatment/Exercise - 12/17/16 0001      Knee/Hip Exercises: Aerobic   Nustep L6 x10 min     Knee/Hip Exercises: Standing   Other Standing Knee Exercises Sidestepping in // bars x5 RT   Other Standing Knee Exercises DLS on airex, tandem on airex x3 min each; Foot on step B x1 min each for stability with functional activities     Knee/Hip Exercises: Seated   Long Arc Quad Strengthening;Both;3 sets;10 reps;Weights   Long Arc Quad Weight 5 lbs.   Marching Limitations 2x10 with 5# bil   Sit to Sand 20 reps;with UE support     Modalities   Modalities Electrical Stimulation;Cryotherapy     Cryotherapy   Number Minutes Cryotherapy 15 Minutes   Cryotherapy Location Knee   Type of Cryotherapy Ice pack  folded pillow case and pants leg for added layering      Electrical Stimulation   Electrical Stimulation Location L knee   Electrical Stimulation Action IFC   Electrical Stimulation Parameters 1-10 hz x15 min   Electrical Stimulation Goals Pain                  PT Short Term Goals - 10/05/16 1037      PT SHORT TERM GOAL #1   Title STG's=LTG's.           PT Long Term Goals - 12/07/16 1033      PT LONG TERM GOAL #1   Title Independent with a HEP.   Time 8   Period Weeks   Status Partially Met     PT LONG TERM GOAL #2   Title Stand 20 minutes with left knee pain not > 3/10.   Baseline pt reporting 4-5/10 with doing dishes and standing for 20 minutes   Time 8   Period Weeks   Status On-going     PT LONG TERM GOAL #3   Title Perform ADL's with pain not > 3/10.   Baseline Pt reporting pain vaires with ADL's from 3-8/10.    Time 8   Period Weeks   Status On-going     PT LONG TERM GOAL #4   Title Pt to be walking 10 minutes twice a day to promote lymphatic circulation    Time 6   Period Weeks   Status On-going     PT LONG TERM GOAL #5   Title Pt to state she is able to walk for 10 minutes without difficulty ( pt limited to 10 minutes due to back issues)   Time 8   Period Weeks   Status On-going               Plan - 12/17/16 1130    Clinical Impression Statement Patient continues to do well in treatment although she reported "normal back pain" today. Did require minimal UE support with sit to stands today. Standing sidestepping and airex activities completed today to assist with improving stability of LEs during activities such as walking,stairs and on uneven surfaces in her enviroment. Patient experienced more difficulty with foot on step activity for stair stability as that increased weightbearing. Normal modalities response noted following removal of the modalities.   Rehab Potential Good   PT Frequency 2x / week   PT Duration 8 weeks  PT Treatment/Interventions Iontophoresis 87m/ml  Dexamethasone;Moist Heat;Patient/family education;Therapeutic exercise;Therapeutic activities;Manual techniques;Vasopneumatic Device;Dry needling;Cryotherapy   PT Next Visit Plan Sitting supported LE strengthening exercises if pt tolerates, progress supine LE strengthening exercises with incresaed ankle weights. Continue with sit to stands for weightbearing and stability.   PT Home Exercise Plan Instructed in TENS use, continue SLR, sit to stand, supine marching, LAQ   Consulted and Agree with Plan of Care Patient      Patient will benefit from skilled therapeutic intervention in order to improve the following deficits and impairments:  Decreased activity tolerance, Decreased strength, Pain, Postural dysfunction, Impaired perceived functional ability, Decreased balance, Decreased mobility  Visit Diagnosis: Difficulty in walking, not elsewhere classified  Chronic pain of left knee  Muscle weakness (generalized)  Difficulty walking     Problem List Patient Active Problem List   Diagnosis Date Noted  . [place] 05/21/2016  . [place] ([place] 03/29/2016  . Normal coronary arteries 2009 01/14/2015  . Obesity-BMI 40 01/14/2015  . Restless leg 01/14/2015  . Chest pain 01/14/2015  . Back pain 01/14/2015  . Renal insufficiency 01/14/2015  . Dementia- mild memory issues 01/14/2015  . Occult blood positive stool 12/14/2014  . Anemia 12/14/2014  . Family history of colon cancer 12/14/2014  . Change in bowel habits 12/14/2014  . GERD (gastroesophageal reflux disease) 12/14/2014  . Dyspnea 05/17/2012  . Lymphedema 05/17/2012  . Weight gain 05/17/2012  . HTN (hypertension) 05/17/2012    KWynelle Fanny PTA 12/17/2016, 11:46 AM  [facility] [address]Phone: [phone]  Fax:  [phone] Name: [name] STEEVESMRN: [id number] of Birth: [birth year]

## 2016-12-18 ENCOUNTER — Telehealth (HOSPITAL_COMMUNITY): Payer: Self-pay | Admitting: *Deleted

## 2016-12-18 DIAGNOSIS — I509 Heart failure, unspecified: Secondary | ICD-10-CM

## 2016-12-18 NOTE — Telephone Encounter (Signed)
Patient called with a weight gain of 4lbs over night. No complaints. Pt takes metolazone 2.5 weekly (tuesdays) and Torsemide 80mg  daily. No K. I told pt I would speak with Amy and return her call also informed patient that Amy is in a conference today but I will return her call before the end of the day.  Message routed to [place]

## 2016-12-18 NOTE — Telephone Encounter (Signed)
Patient returned call before we were able to hear from Darrick Grinder, NP as she is in a conference here on site at [facility]  Patient is c/o increased weight x 4-5 lbs Increased LE edema Although she feels fine right now, she was very SOB last night and had a difficult time resting.  CardioMems reading = 18  (12/18/16) CardioMems reading = 13* (12/17/16)   *goal reading  Above discussed with Dr Aundra Dubin  Per Dr Aundra Dubin Take an additional dose of metolazone today and return early next week for bmet  Patient aware via daughter and will return for labs 12/22/16

## 2016-12-21 ENCOUNTER — Encounter (HOSPITAL_COMMUNITY): Payer: Medicare Other

## 2016-12-22 ENCOUNTER — Ambulatory Visit: Payer: Medicare Other | Admitting: Physical Therapy

## 2016-12-22 ENCOUNTER — Ambulatory Visit (HOSPITAL_COMMUNITY)
Admission: RE | Admit: 2016-12-22 | Discharge: 2016-12-22 | Disposition: A | Discharge status: Home / Self Care | Payer: Medicare Other | Source: Ambulatory Visit | Attending: Cardiology | Admitting: Cardiology

## 2016-12-22 DIAGNOSIS — I5032 Chronic diastolic (congestive) heart failure: Secondary | ICD-10-CM | POA: Diagnosis not present

## 2016-12-22 DIAGNOSIS — I509 Heart failure, unspecified: Secondary | ICD-10-CM

## 2016-12-22 LAB — BASIC METABOLIC PANEL
ANION GAP: 11 (ref 5–15)
BUN: 46 mg/dL — ABNORMAL HIGH (ref 6–20)
CHLORIDE: 98 mmol/L — AB (ref 101–111)
CO2: 27 mmol/L (ref 22–32)
CREATININE: 1.99 mg/dL — AB (ref 0.44–1.00)
Calcium: 10.3 mg/dL (ref 8.9–10.3)
GFR calc non Af Amer: 24 mL/min — ABNORMAL LOW (ref >60)
GFR, EST AFRICAN AMERICAN: 28 mL/min — AB (ref >60)
Glucose, Bld: 102 mg/dL — ABNORMAL HIGH (ref 65–99)
Potassium: 4.2 mmol/L (ref 3.5–5.1)
SODIUM: 136 mmol/L (ref 135–145)

## 2016-12-22 LAB — BRAIN NATRIURETIC PEPTIDE: B Natriuretic Peptide: 62.9 pg/mL (ref 0.0–100.0)

## 2016-12-24 ENCOUNTER — Encounter: Payer: Medicare Other | Admitting: Physical Therapy

## 2016-12-24 ENCOUNTER — Ambulatory Visit (HOSPITAL_COMMUNITY)
Admission: RE | Admit: 2016-12-24 | Discharge: 2016-12-24 | Disposition: A | Discharge status: Home / Self Care | Payer: Medicare Other | Source: Ambulatory Visit | Attending: Cardiology | Admitting: Cardiology

## 2016-12-24 DIAGNOSIS — I5032 Chronic diastolic (congestive) heart failure: Secondary | ICD-10-CM

## 2016-12-24 NOTE — Progress Notes (Signed)
   Cardiomems Update  Implant Date 11/13/2016   RHC 11/13/2016 RA 4 PA 23/10 (15)  PCWP 5 PA 76% CO 9.1 CI 4.1    Implant Labs 11/13/2016  Creatinine 2.12 BUN 58 Potassium 3.8    PAD PAD Range 10-17  Current PAD--->15  Current Labs 12/22/2016 Creatinine 1.99 BUN 46 Potassium 4.2    Recommendations:  I have reviewed current PAD from cardiomems device. PAD reading stable. Volume status stable. Renal function stable. Continue current diuretic regimen. Continue to follow monthly.   Amy Clegg NP-C  1:58 PM

## 2016-12-29 ENCOUNTER — Encounter: Payer: Self-pay | Admitting: Physical Therapy

## 2016-12-29 ENCOUNTER — Ambulatory Visit: Payer: Medicare Other | Attending: Orthopedic Surgery | Admitting: Physical Therapy

## 2016-12-29 DIAGNOSIS — M6281 Muscle weakness (generalized): Secondary | ICD-10-CM | POA: Insufficient documentation

## 2016-12-29 DIAGNOSIS — R262 Difficulty in walking, not elsewhere classified: Secondary | ICD-10-CM

## 2016-12-29 DIAGNOSIS — M25562 Pain in left knee: Secondary | ICD-10-CM | POA: Diagnosis present

## 2016-12-29 DIAGNOSIS — G8929 Other chronic pain: Secondary | ICD-10-CM | POA: Diagnosis present

## 2016-12-29 NOTE — Therapy (Signed)
[facility] [place], [place], [address] Phone: [phone]   Fax:  [phone]  Physical Therapy Treatment  Patient Details  Name: [name] MRN: [id number] Date of Birth: 10-20-1945 Referring Provider: Earlie Server, MD  Encounter Date: 12/29/2016      PT End of Session - 12/29/16 0958    Visit Number 20   Number of Visits 30   Date for PT Re-Evaluation 01/20/17   PT Start Time 0951   PT Stop Time 5361  secondary to difficulty with machinary and symptom assessment   PT Time Calculation (min) 59 min   Activity Tolerance Patient tolerated treatment well   Behavior During Therapy [facility] for tasks assessed/performed      Past Medical History:  Diagnosis Date  . Anemia   . Anxiety   . Arthritis   . Brain lesion    2 types  . Cervical spondylosis with myelopathy   . Chest pain    Normal cardiac cath 5/09  . Congestive heart failure (CHF) ([place])   . Depression   . Dumping syndrome   . Edema   . Fatigue   . Fibromyalgia   . History of echocardiogram    a. Echo 03/20/16 (done at [facility] in [place], [place]):  mild LVH, EF 44%, normal diastolic function, mild LAE, MAC, RVSP 25 mmHg  . Hyperlipidemia   . Hypertension   . Hypothyroidism   . Lumbar spondylosis   . Lymphedema    seeing specialist for this  . Restless leg syndrome   . Rotator cuff injury   . Stroke [facility])    TIAs "mini strokes"- unsure of last TIA  . Syncope   . Tremors of nervous system     Past Surgical History:  Procedure Laterality Date  . APPENDECTOMY  1966  . CARDIAC CATHETERIZATION N/A 05/25/2016   Procedure: Right/Left Heart Cath and Coronary Angiography;  Surgeon: Larey Dresser, MD;  Location: [place] [facility];  Service: Cardiovascular;  Laterality: N/A;  . CHOLECYSTECTOMY  2015  . CHOLECYSTECTOMY  2015  . CORONARY ANGIOGRAM  2009   Normal coronaries  . Hip Bursa Surgery Bilateral   . KNEE ARTHROSCOPY Bilateral   . LUMBAR  DISC SURGERY     x2  . LUMBAR FUSION    . RIGHT HEART CATH N/A 11/13/2016   Procedure: Right Heart Cath with Cardiomems;  Surgeon: Larey Dresser, MD;  Location: [place] [facility];  Service: Cardiovascular;  Laterality: N/A;  . ROTATOR CUFF REPAIR Bilateral   . TOTAL ABDOMINAL HYSTERECTOMY    . TOTAL KNEE ARTHROPLASTY Left   . TOTAL KNEE ARTHROPLASTY Right 11/18/2012   Procedure: TOTAL KNEE ARTHROPLASTY;  Surgeon: Yvette Rack., MD;  Location: [place];  Service: Orthopedics;  Laterality: Right;    There were no vitals filed for this visit.      Subjective Assessment - 12/29/16 0956    Subjective Reports pain in the posterior knees for 3 days with tenderness palpated along L medial HS and laterosuperior calf. Reports cramping in B calves for 45 min to 1 hour recently. Reports pain into quads.    Pertinent History Bilateral total knee replacements. Bilateral rotator cuff repairs   Limitations Walking   How long can you stand comfortably? Less than 5-10 minutes.   How long can you walk comfortably? < 5 min   Patient Stated Goals Get out of pain, walk better.    Currently in Pain? Yes  Pain Score 7    Pain Location Knee   Pain Orientation Right;Left   Pain Descriptors / Indicators Heaviness   Pain Type Chronic pain   Pain Onset More than a month ago   Pain Frequency Constant   Aggravating Factors  Knee flexion            [facility] PT Assessment - 12/29/16 0001      Assessment   Medical Diagnosis left knee pain   Next MD Visit None presently     Restrictions   Weight Bearing Restrictions No                     [facility] Adult PT Treatment/Exercise - 12/29/16 0001      Knee/Hip Exercises: Aerobic   Nustep L6 x10 min     Knee/Hip Exercises: Standing   Other Standing Knee Exercises SLS on airex opp LE on floor x2 min each LE     Knee/Hip Exercises: Seated   Long Arc Quad Strengthening;Both;2 sets;10 reps;Weights   Long Arc Quad Weight 5 lbs.     Knee/Hip  Exercises: Supine   Straight Leg Raises Strengthening;Both;2 sets;10 reps   Other Supine Knee/Hip Exercises Supine clam with green theraband x30 reps      Modalities   Modalities Electrical Stimulation     Electrical Stimulation   Electrical Stimulation Location L knee   Electrical Stimulation Action Pre-Mod   Electrical Stimulation Parameters 80-150 hz x15 min   Electrical Stimulation Goals Pain                  PT Short Term Goals - 10/05/16 1037      PT SHORT TERM GOAL #1   Title STG's=LTG's.           PT Long Term Goals - 12/07/16 1033      PT LONG TERM GOAL #1   Title Independent with a HEP.   Time 8   Period Weeks   Status Partially Met     PT LONG TERM GOAL #2   Title Stand 20 minutes with left knee pain not > 3/10.   Baseline pt reporting 4-5/10 with doing dishes and standing for 20 minutes   Time 8   Period Weeks   Status On-going     PT LONG TERM GOAL #3   Title Perform ADL's with pain not > 3/10.   Baseline Pt reporting pain vaires with ADL's from 3-8/10.    Time 8   Period Weeks   Status On-going     PT LONG TERM GOAL #4   Title Pt to be walking 10 minutes twice a day to promote lymphatic circulation    Time 6   Period Weeks   Status On-going     PT LONG TERM GOAL #5   Title Pt to state she is able to walk for 10 minutes without difficulty ( pt limited to 10 minutes due to back issues)   Time 8   Period Weeks   Status On-going               Plan - 12/29/16 1112    Clinical Impression Statement Patient arrived to clinic with increased LLE pain especially with ambulation and knee flexion. Upon palpation of L knee in sitting patient experienced and reported tenderness in L medial HS distal region as well as along L lateroinferior calf region. Patient limited with sitting and standing exercises secondary to pain and with pain into quads per patient  report. Appropriate ankle strategies noted with airex stability exercises but  limited by pain. Normal modality response noted following removal of the electrical stimulation.   Rehab Potential Good   PT Frequency 2x / week   PT Duration 8 weeks   PT Treatment/Interventions Iontophoresis 52m/ml Dexamethasone;Moist Heat;Patient/family education;Therapeutic exercise;Therapeutic activities;Manual techniques;Vasopneumatic Device;Dry needling;Cryotherapy   PT Next Visit Plan Sitting supported LE strengthening exercises if pt tolerates, progress supine LE strengthening exercises with incresaed ankle weights. Continue with sit to stands for weightbearing and stability.   PT Home Exercise Plan Instructed in TENS use, continue SLR, sit to stand, supine marching, LAQ   Consulted and Agree with Plan of Care Patient      Patient will benefit from skilled therapeutic intervention in order to improve the following deficits and impairments:  Decreased activity tolerance, Decreased strength, Pain, Postural dysfunction, Impaired perceived functional ability, Decreased balance, Decreased mobility  Visit Diagnosis: Difficulty in walking, not elsewhere classified  Chronic pain of left knee  Muscle weakness (generalized)     Problem List Patient Active Problem List   Diagnosis Date Noted  . [place]stable [place]ina ([place] 05/21/2016  . Chronic diastolic heart failure ([place] 03/29/2016  . Normal coronary arteries 2009 01/14/2015  . Obesity-BMI 40 01/14/2015  . Restless leg 01/14/2015  . Chest pain 01/14/2015  . Back pain 01/14/2015  . Renal insufficiency 01/14/2015  . Dementia- mild memory issues 01/14/2015  . Occult blood positive stool 12/14/2014  . Anemia 12/14/2014  . Family history of colon cancer 12/14/2014  . Change in bowel habits 12/14/2014  . GERD (gastroesophageal reflux disease) 12/14/2014  . Dyspnea 05/17/2012  . Lymphedema 05/17/2012  . Weight gain 05/17/2012  . HTN (hypertension) 05/17/2012    KWynelle Fanny PTA 12/29/2016, 11:20 AM  [facility] [address]Phone: [phone]  Fax:  [phone] Name: CNADIRAH SOCORROMRN: [id number] of Birth: [birth year]

## 2016-12-31 ENCOUNTER — Ambulatory Visit: Payer: Medicare Other | Admitting: Physical Therapy

## 2016-12-31 ENCOUNTER — Encounter: Payer: Self-pay | Admitting: Physical Therapy

## 2016-12-31 DIAGNOSIS — R262 Difficulty in walking, not elsewhere classified: Secondary | ICD-10-CM | POA: Diagnosis not present

## 2016-12-31 DIAGNOSIS — M6281 Muscle weakness (generalized): Secondary | ICD-10-CM

## 2016-12-31 DIAGNOSIS — M25562 Pain in left knee: Secondary | ICD-10-CM

## 2016-12-31 DIAGNOSIS — G8929 Other chronic pain: Secondary | ICD-10-CM

## 2016-12-31 NOTE — Therapy (Signed)
[facility] [place], [place], [address] Phone: [phone]   Fax:  [phone]  Physical Therapy Treatment  Patient Details  Name: [name] MRN: [id number] Date of Birth: May 23, 1946 Referring Provider: Earlie Server, MD  Encounter Date: 12/31/2016      PT End of Session - 12/31/16 0955    Visit Number 21   Number of Visits 30   Date for PT Re-Evaluation 01/20/17   PT Start Time 0952   PT Stop Time 1038   PT Time Calculation (min) 46 min   Activity Tolerance Patient tolerated treatment well   Behavior During Therapy [facility] for tasks assessed/performed      Past Medical History:  Diagnosis Date  . Anemia   . Anxiety   . Arthritis   . Brain lesion    2 types  . Cervical spondylosis with myelopathy   . Chest pain    Normal cardiac cath 5/09  . Congestive heart failure (CHF) ([place])   . Depression   . Dumping syndrome   . Edema   . Fatigue   . Fibromyalgia   . History of echocardiogram    a. Echo 03/20/16 (done at [facility] in [place], [place]):  mild LVH, EF 15%, normal diastolic function, mild LAE, MAC, RVSP 25 mmHg  . Hyperlipidemia   . Hypertension   . Hypothyroidism   . Lumbar spondylosis   . Lymphedema    seeing specialist for this  . Restless leg syndrome   . Rotator cuff injury   . Stroke [facility])    TIAs "mini strokes"- unsure of last TIA  . Syncope   . Tremors of nervous system     Past Surgical History:  Procedure Laterality Date  . APPENDECTOMY  1966  . CARDIAC CATHETERIZATION N/A 05/25/2016   Procedure: Right/Left Heart Cath and Coronary Angiography;  Surgeon: Larey Dresser, MD;  Location: [place] [facility];  Service: Cardiovascular;  Laterality: N/A;  . CHOLECYSTECTOMY  2015  . CHOLECYSTECTOMY  2015  . CORONARY ANGIOGRAM  2009   Normal coronaries  . Hip Bursa Surgery Bilateral   . KNEE ARTHROSCOPY Bilateral   . LUMBAR DISC SURGERY     x2  . LUMBAR FUSION    . RIGHT HEART CATH  N/A 11/13/2016   Procedure: Right Heart Cath with Cardiomems;  Surgeon: Larey Dresser, MD;  Location: [place] [facility];  Service: Cardiovascular;  Laterality: N/A;  . ROTATOR CUFF REPAIR Bilateral   . TOTAL ABDOMINAL HYSTERECTOMY    . TOTAL KNEE ARTHROPLASTY Left   . TOTAL KNEE ARTHROPLASTY Right 11/18/2012   Procedure: TOTAL KNEE ARTHROPLASTY;  Surgeon: Yvette Rack., MD;  Location: [place];  Service: Orthopedics;  Laterality: Right;    There were no vitals filed for this visit.      Subjective Assessment - 12/31/16 0954    Subjective Reports that her knees are feeling better today. States that she took pain medication prior to coming to therapy today.   Pertinent History Bilateral total knee replacements. Bilateral rotator cuff repairs   Limitations Walking   How long can you stand comfortably? Less than 5-10 minutes.   How long can you walk comfortably? < 5 min   Patient Stated Goals Get out of pain, walk better.    Currently in Pain? Yes   Pain Score 5    Pain Location Knee   Pain Orientation Right;Left   Pain Descriptors / Indicators Discomfort   Pain Type  Chronic pain   Pain Onset More than a month ago            [facility] PT Assessment - 12/31/16 0001      Assessment   Medical Diagnosis left knee pain   Next MD Visit None presently     Restrictions   Weight Bearing Restrictions No                     [facility] Adult PT Treatment/Exercise - 12/31/16 0001      Knee/Hip Exercises: Aerobic   Nustep L6 x10 min     Knee/Hip Exercises: Standing   Hip Flexion AROM;Both;2 sets;10 reps;Knee bent   Hip Abduction AROM;Both;2 sets;10 reps;Knee straight   Forward Step Up Both;1 set;15 reps;Hand Hold: 2;Step Height: 4"   Other Standing Knee Exercises Narrow BOS on airex x2 min for functional stability on outdoor surfaces     Knee/Hip Exercises: Seated   Long Arc Quad Strengthening;Both;3 sets;10 reps;Weights   Long Arc Quad Weight 5 lbs.   Sit to [employer] --      Modalities   Modalities Electrical Stimulation;Cryotherapy     Cryotherapy   Number Minutes Cryotherapy 15 Minutes   Cryotherapy Location Knee   Type of Cryotherapy Ice pack     Electrical Stimulation   Electrical Stimulation Location L knee   Electrical Stimulation Action Pre-Mod   Electrical Stimulation Parameters 80-150 hz x15 min   Electrical Stimulation Goals Pain                  PT Short Term Goals - 10/05/16 1037      PT SHORT TERM GOAL #1   Title STG's=LTG's.           PT Long Term Goals - 12/07/16 1033      PT LONG TERM GOAL #1   Title Independent with a HEP.   Time 8   Period Weeks   Status Partially Met     PT LONG TERM GOAL #2   Title Stand 20 minutes with left knee pain not > 3/10.   Baseline pt reporting 4-5/10 with doing dishes and standing for 20 minutes   Time 8   Period Weeks   Status On-going     PT LONG TERM GOAL #3   Title Perform ADL's with pain not > 3/10.   Baseline Pt reporting pain vaires with ADL's from 3-8/10.    Time 8   Period Weeks   Status On-going     PT LONG TERM GOAL #4   Title Pt to be walking 10 minutes twice a day to promote lymphatic circulation    Time 6   Period Weeks   Status On-going     PT LONG TERM GOAL #5   Title Pt to state she is able to walk for 10 minutes without difficulty ( pt limited to 10 minutes due to back issues)   Time 8   Period Weeks   Status On-going               Plan - 12/31/16 1044    Clinical Impression Statement Patient did well today with more standing exercises and arrived to treatment with knees feeling better. Standing AROM hip strengthening completed today with patient fatiguing and requiring rest break between sets. Forward step ups were completed today bilaterally today with no complaint. Patient educated that it is normal for some amount of ankle strategy with standing activities. Patient interested in attempting gait training next  treatment with [facility] per MD  wishes. Normal modalities response noted following removal of the modalities.   Rehab Potential Good   PT Frequency 2x / week   PT Duration 8 weeks   PT Treatment/Interventions Iontophoresis 37m/ml Dexamethasone;Moist Heat;Patient/family education;Therapeutic exercise;Therapeutic activities;Manual techniques;Vasopneumatic Device;Dry needling;Cryotherapy   PT Next Visit Plan Continue standing exercises with [facility]emp [facility].   PT Home Exercise Plan Instructed in TENS use, continue SLR, sit to stand, supine marching, LAQ   Consulted and Agree with Plan of Care Patient      Patient will benefit from skilled therapeutic intervention in order to improve the following deficits and impairments:  Decreased activity tolerance, Decreased strength, Pain, Postural dysfunction, Impaired perceived functional ability, Decreased balance, Decreased mobility  Visit Diagnosis: Difficulty in walking, not elsewhere classified  Chronic pain of left knee  Muscle weakness (generalized)     Problem List Patient Active Problem List   Diagnosis Date Noted  . Unstable angina ([place] 05/21/2016  . Chronic diastolic heart failure ([place] 03/29/2016  . Normal coronary arteries 2009 01/14/2015  . Obesity-BMI 40 01/14/2015  . Restless leg 01/14/2015  . Chest pain 01/14/2015  . Back pain 01/14/2015  . Renal insufficiency 01/14/2015  . Dementia- mild memory issues 01/14/2015  . Occult blood positive stool 12/14/2014  . Anemia 12/14/2014  . Family history of colon cancer 12/14/2014  . Change in bowel habits 12/14/2014  . GERD (gastroesophageal reflux disease) 12/14/2014  . Dyspnea 05/17/2012  . Lymphedema 05/17/2012  . Weight gain 05/17/2012  . HTN (hypertension) 05/17/2012    KWynelle Fanny PTA 12/31/2016, 12:17 PM  [facility] [address] [address]Phone: [phone]  Fax:  [phone] Name: [name] CULLIVERMRN: [id number] of Birth: 625-Sep-1947

## 2017-01-02 ENCOUNTER — Encounter: Payer: Self-pay | Admitting: Cardiology

## 2017-01-02 ENCOUNTER — Telehealth: Payer: Self-pay | Admitting: Cardiology

## 2017-01-02 NOTE — Telephone Encounter (Signed)
Check Cardiomems.

## 2017-01-02 NOTE — Telephone Encounter (Signed)
Received page from answering service re: 4 lb weight gain and swelling. Called and spoke to patient. She notes a 4 lb weight gain overnight with significant leg and abdominal swelling. She does not have chest pain, and she felt slightly short of breath in the morning but is now breathing at baseline. Called to ask for guidance on diuretics. She did take her prescribed dose of torsemide this AM with some urine output, but not brisk. She drank small amount extra water to see if that would increase her urine output. She typically limits herself to 64 oz water per day. She no longer has metolazone prescription; she states this was stopped due to her kidney function.  We discussed option of attempting home diuresis tonight versus coming to ER for IV lasix. She would prefer to try additional dose of 80 mg torsemide tonight and see if that helps. She will stay within her fluid guidelines. She was instructed that if she feels worse, or if the additional diuretic does not improve her symptoms, that she should come to the ER for further evaluation and treatment. She understands the directions.   Buford Dresser, cardiology moonlighter for [facility].

## 2017-01-04 NOTE — Telephone Encounter (Signed)
Cardiomems: (pt's goal is 13 mmHg)  Monday  01-04-2017, 06:53 PAD 14 mmHg  Sunday  01-03-2017, 06:24 PAD 13 mmHg  Saturday  01-02-2017, 06:27 PAD 13 mmHg Friday   01-01-2017, 07:03 PAD 14 mmHg  Thursday 12-31-2016, 07:34 PAD  14 mmHg    Spoke w/pt, she states her wt is down 8 lbs today after taking extra Lasix Sat and Sunday.  Advised pt her PAD number was great and had not been elevated over weekend, unsure why she became symptomatic.  Advised pt to continue current doses of meds today with no more extra.  Pt aware and agreeable.    Dr Aundra Dubin is aware of all the above.

## 2017-01-05 ENCOUNTER — Encounter: Payer: Medicare Other | Admitting: Physical Therapy

## 2017-01-07 ENCOUNTER — Encounter: Payer: Self-pay | Admitting: Physical Therapy

## 2017-01-07 ENCOUNTER — Ambulatory Visit: Payer: Medicare Other | Admitting: Physical Therapy

## 2017-01-07 DIAGNOSIS — R262 Difficulty in walking, not elsewhere classified: Secondary | ICD-10-CM | POA: Diagnosis not present

## 2017-01-07 DIAGNOSIS — M6281 Muscle weakness (generalized): Secondary | ICD-10-CM

## 2017-01-07 DIAGNOSIS — M25562 Pain in left knee: Secondary | ICD-10-CM

## 2017-01-07 DIAGNOSIS — G8929 Other chronic pain: Secondary | ICD-10-CM

## 2017-01-07 NOTE — Therapy (Signed)
[facility] [place], [place], [address] Phone: [phone]   Fax:  [phone]  Physical Therapy Treatment  Patient Details  Name: [name] MRN: [id number] Date of Birth: 04-23-1946 Referring Provider: Earlie Server, MD  Encounter Date: 01/07/2017      PT End of Session - 01/07/17 0949    Visit Number 22   Number of Visits 30   Date for PT Re-Evaluation 01/20/17   PT Start Time 0947  3 units secondary to rest breaks and bathroom break   PT Stop Time 1041   PT Time Calculation (min) 54 min   Activity Tolerance Patient tolerated treatment well   Behavior During Therapy [facility] for tasks assessed/performed      Past Medical History:  Diagnosis Date  . Anemia   . Anxiety   . Arthritis   . Brain lesion    2 types  . Cervical spondylosis with myelopathy   . Chest pain    Normal cardiac cath 5/09  . Congestive heart failure (CHF) ([place])   . Depression   . Dumping syndrome   . Edema   . Fatigue   . Fibromyalgia   . History of echocardiogram    a. Echo 03/20/16 (done at [facility] in [place], [place]):  mild LVH, EF 26%, normal diastolic function, mild LAE, MAC, RVSP 25 mmHg  . Hyperlipidemia   . Hypertension   . Hypothyroidism   . Lumbar spondylosis   . Lymphedema    seeing specialist for this  . Restless leg syndrome   . Rotator cuff injury   . Stroke [facility])    TIAs "mini strokes"- unsure of last TIA  . Syncope   . Tremors of nervous system     Past Surgical History:  Procedure Laterality Date  . APPENDECTOMY  1966  . CARDIAC CATHETERIZATION N/A 05/25/2016   Procedure: Right/Left Heart Cath and Coronary Angiography;  Surgeon: Larey Dresser, MD;  Location: [place] CV LAB;  Service: Cardiovascular;  Laterality: N/A;  . CHOLECYSTECTOMY  2015  . CHOLECYSTECTOMY  2015  . CORONARY ANGIOGRAM  2009   Normal coronaries  . Hip Bursa Surgery Bilateral   . KNEE ARTHROSCOPY Bilateral   . LUMBAR DISC  SURGERY     x2  . LUMBAR FUSION    . RIGHT HEART CATH N/A 11/13/2016   Procedure: Right Heart Cath with Cardiomems;  Surgeon: Larey Dresser, MD;  Location: [place] CV LAB;  Service: Cardiovascular;  Laterality: N/A;  . ROTATOR CUFF REPAIR Bilateral   . TOTAL ABDOMINAL HYSTERECTOMY    . TOTAL KNEE ARTHROPLASTY Left   . TOTAL KNEE ARTHROPLASTY Right 11/18/2012   Procedure: TOTAL KNEE ARTHROPLASTY;  Surgeon: Yvette Rack., MD;  Location: [place];  Service: Orthopedics;  Laterality: Right;    There were no vitals filed for this visit.      Subjective Assessment - 01/07/17 0948    Subjective Reports that her legs feel rough today. Reports that MD said she has a bone spur in L knee and had bone scan done to check cement breakdown in L knee. Will meet with MD again tomorrow.   Pertinent History Bilateral total knee replacements. Bilateral rotator cuff repairs   Limitations Walking   How long can you stand comfortably? Less than 5-10 minutes.   How long can you walk comfortably? < 5 min   Patient Stated Goals Get out of pain, walk better.    Currently in Pain? Yes  Pain Score 6    Pain Location Knee   Pain Orientation Right;Left   Pain Descriptors / Indicators Discomfort   Pain Type Chronic pain   Pain Onset More than a month ago   Pain Frequency Constant            [facility] PT Assessment - 01/07/17 0001      Assessment   Medical Diagnosis left knee pain   Next MD Visit 01/08/2017     Restrictions   Weight Bearing Restrictions No                     [facility] Adult PT Treatment/Exercise - 01/07/17 0001      Knee/Hip Exercises: Aerobic   Nustep L6 x15 min     Knee/Hip Exercises: Standing   Lateral Step Up Left;2 sets;10 reps;Hand Hold: 2;Step Height: 4"   Forward Step Up Left;2 sets;10 reps;Hand Hold: 2;Step Height: 4"   Functional Squat 2 sets;10 reps     Knee/Hip Exercises: Seated   Long Arc Quad Strengthening;Left;3 sets;15 reps;Weights   Long Arc Quad  Weight 5 lbs.     Modalities   Modalities Electrical Stimulation;Cryotherapy     Cryotherapy   Number Minutes Cryotherapy 15 Minutes   Cryotherapy Location Knee   Type of Cryotherapy Ice pack     Electrical Stimulation   Electrical Stimulation Location L knee   Electrical Stimulation Action Pre-Mod   Electrical Stimulation Parameters 80-150 hz x15 min   Electrical Stimulation Goals Pain                  PT Short Term Goals - 10/05/16 1037      PT SHORT TERM GOAL #1   Title STG's=LTG's.           PT Long Term Goals - 12/07/16 1033      PT LONG TERM GOAL #1   Title Independent with a HEP.   Time 8   Period Weeks   Status Partially Met     PT LONG TERM GOAL #2   Title Stand 20 minutes with left knee pain not > 3/10.   Baseline pt reporting 4-5/10 with doing dishes and standing for 20 minutes   Time 8   Period Weeks   Status On-going     PT LONG TERM GOAL #3   Title Perform ADL's with pain not > 3/10.   Baseline Pt reporting pain vaires with ADL's from 3-8/10.    Time 8   Period Weeks   Status On-going     PT LONG TERM GOAL #4   Title Pt to be walking 10 minutes twice a day to promote lymphatic circulation    Time 6   Period Weeks   Status On-going     PT LONG TERM GOAL #5   Title Pt to state she is able to walk for 10 minutes without difficulty ( pt limited to 10 minutes due to back issues)   Time 8   Period Weeks   Status On-going               Plan - 01/07/17 1112    Clinical Impression Statement Patient did well today as more standing activities were completed with only reports of fatigue during the exercises. Patient able to complete more reps of LAQ without complaint as well. Patient experienced more posterior knee discomfort upon arrival where baker's cyst present per patient. Normal modalities response noted following removal of the modalities.   Rehab  Potential Good   PT Frequency 2x / week   PT Duration 8 weeks   PT  Treatment/Interventions Iontophoresis 47m/ml Dexamethasone;Moist Heat;Patient/family education;Therapeutic exercise;Therapeutic activities;Manual techniques;Vasopneumatic Device;Dry needling;Cryotherapy;Gait training   PT Next Visit Plan Continue standing [facility]   PT Home Exercise Plan Instructed in TENS use, continue SLR, sit to stand, supine marching, LAQ   Consulted and Agree with Plan of Care Patient      Patient will benefit from skilled therapeutic intervention in order to improve the following deficits and impairments:  Decreased activity tolerance, Decreased strength, Pain, Postural dysfunction, Impaired perceived functional ability, Decreased balance, Decreased mobility  Visit Diagnosis: Difficulty in walking, not elsewhere classified  Chronic pain of left knee  Muscle weakness (generalized)     Problem List Patient Active Problem List   Diagnosis Date Noted  . Unstable angina ([place] 05/21/2016  . Chronic diastolic heart failure ([place] 03/29/2016  . Normal coronary arteries 2009 01/14/2015  . Obesity-BMI 40 01/14/2015  . Restless leg 01/14/2015  . Chest pain 01/14/2015  . Back pain 01/14/2015  . Renal insufficiency 01/14/2015  . Dementia- mild memory issues 01/14/2015  . Occult blood positive stool 12/14/2014  . Anemia 12/14/2014  . Family history of colon cancer 12/14/2014  . Change in bowel habits 12/14/2014  . GERD (gastroesophageal reflux disease) 12/14/2014  . Dyspnea 05/17/2012  . Lymphedema 05/17/2012  . Weight gain 05/17/2012  . HTN (hypertension) 05/17/2012    KWynelle Fanny PTA 01/07/2017, 11:17 AM  [facility] [place] [address]Phone: [phone]  Fax:  [phone] Name: CWRENLY LAURITSENMRN: [id number] of Birth: 6December 21, 1947

## 2017-01-10 ENCOUNTER — Emergency Department (HOSPITAL_COMMUNITY): Payer: Medicare Other

## 2017-01-10 ENCOUNTER — Emergency Department (HOSPITAL_COMMUNITY)
Admission: EM | Admit: 2017-01-10 | Discharge: 2017-01-10 | Disposition: A | Discharge status: Home / Self Care | Payer: Medicare Other | Attending: Emergency Medicine | Admitting: Emergency Medicine

## 2017-01-10 ENCOUNTER — Telehealth: Payer: Self-pay | Admitting: Physician Assistant

## 2017-01-10 ENCOUNTER — Encounter (HOSPITAL_COMMUNITY): Payer: Self-pay

## 2017-01-10 DIAGNOSIS — I509 Heart failure, unspecified: Secondary | ICD-10-CM | POA: Diagnosis not present

## 2017-01-10 DIAGNOSIS — Z8673 Personal history of transient ischemic attack (TIA), and cerebral infarction without residual deficits: Secondary | ICD-10-CM | POA: Insufficient documentation

## 2017-01-10 DIAGNOSIS — Z7982 Long term (current) use of aspirin: Secondary | ICD-10-CM | POA: Diagnosis not present

## 2017-01-10 DIAGNOSIS — Z79899 Other long term (current) drug therapy: Secondary | ICD-10-CM | POA: Diagnosis not present

## 2017-01-10 DIAGNOSIS — E039 Hypothyroidism, unspecified: Secondary | ICD-10-CM | POA: Insufficient documentation

## 2017-01-10 DIAGNOSIS — I110 Hypertensive heart disease with heart failure: Secondary | ICD-10-CM | POA: Insufficient documentation

## 2017-01-10 DIAGNOSIS — Z96653 Presence of artificial knee joint, bilateral: Secondary | ICD-10-CM | POA: Insufficient documentation

## 2017-01-10 DIAGNOSIS — R0602 Shortness of breath: Secondary | ICD-10-CM

## 2017-01-10 LAB — CBC
HEMATOCRIT: 39.5 % (ref 36.0–46.0)
HEMOGLOBIN: 12.9 g/dL (ref 12.0–15.0)
MCH: 30.4 pg (ref 26.0–34.0)
MCHC: 32.7 g/dL (ref 30.0–36.0)
MCV: 92.9 fL (ref 78.0–100.0)
Platelets: 279 10*3/uL (ref 150–400)
RBC: 4.25 MIL/uL (ref 3.87–5.11)
RDW: 14.6 % (ref 11.5–15.5)
WBC: 10 10*3/uL (ref 4.0–10.5)

## 2017-01-10 LAB — BASIC METABOLIC PANEL
ANION GAP: 13 (ref 5–15)
BUN: 66 mg/dL — ABNORMAL HIGH (ref 6–20)
CALCIUM: 10.9 mg/dL — AB (ref 8.9–10.3)
CO2: 29 mmol/L (ref 22–32)
Chloride: 94 mmol/L — ABNORMAL LOW (ref 101–111)
Creatinine, Ser: 2.36 mg/dL — ABNORMAL HIGH (ref 0.44–1.00)
GFR, EST AFRICAN AMERICAN: 23 mL/min — AB (ref >60)
GFR, EST NON AFRICAN AMERICAN: 20 mL/min — AB (ref >60)
Glucose, Bld: 110 mg/dL — ABNORMAL HIGH (ref 65–99)
POTASSIUM: 4 mmol/L (ref 3.5–5.1)
Sodium: 136 mmol/L (ref 135–145)

## 2017-01-10 LAB — I-STAT TROPONIN, ED: Troponin i, poc: 0.01 ng/mL (ref 0.00–0.08)

## 2017-01-10 LAB — BRAIN NATRIURETIC PEPTIDE: B Natriuretic Peptide: 75.2 pg/mL (ref 0.0–100.0)

## 2017-01-10 MED ORDER — NITROGLYCERIN 0.4 MG SL SUBL: 0.4000 mg | SUBLINGUAL_TABLET | SUBLINGUAL | Status: DC | PRN

## 2017-01-10 MED ORDER — TORSEMIDE 20 MG PO TABS
40.0000 mg | ORAL_TABLET | Freq: Every day | ORAL | Status: DC
  Administered 2017-01-10: 40 mg via ORAL
  Filled 2017-01-10: qty 2

## 2017-01-10 NOTE — Telephone Encounter (Signed)
Paged by answering service. Spoke with patient's granddaughter Otila Kluver. Patient having dyspnea, orthopnea, PND and dry cough. EMS came for evaluation and recommended further evaluation in ER. However, pt declined [place] evaluation. [place] [address]. During my conversation with Otila Kluver, the patient is coughing non stop. No fever, chills or syncope. No improvement after torsemide 80mg  this morning. Has gained weight. Advised to come to ER for further evaluation. Granddaughter will bring patient to [facility] for further eval. IF not coming, advised to take extra 40mg  of Torsemide in PM and call tomorrow morning for appointment.

## 2017-01-10 NOTE — ED Notes (Signed)
ED Provider at bedside. 

## 2017-01-10 NOTE — ED Provider Notes (Signed)
[place] DEPT Provider Note   CSN: [id number] Arrival date & time: 01/10/17  1605     History   Chief Complaint Chief Complaint  Patient presents with  . Shortness of Breath  . Congestive Heart Failure    HPI [name] is a 71 y.o. female.  Patient with history of heart failure status post cardiomems device who presents to the ED with shortness of breath and cough similar to prior heart failure exacerbations. Patient denies chest pain, recent travel, recent surgery, PE history. Patient is closely followed by heart failure team. She states that she has gained 2 pounds overnight but no one has been able to call about her cardomems device as it is the weekend. Patient ambulates with walker and has right arm pain that is chronic and intermittent.    The history is provided by the patient and a caregiver.  Shortness of Breath  This is a chronic problem. The problem occurs frequently.The current episode started 6 to 12 hours ago. The problem has not changed since onset.Associated symptoms include cough. Pertinent negatives include no fever, no headaches, no coryza, no rhinorrhea, no sore throat, no swollen glands, no ear pain, no neck pain, no sputum production, no hemoptysis, no wheezing, no PND, no orthopnea, no chest pain, no syncope, no vomiting, no abdominal pain, no rash, no leg pain and no claudication. It is unknown what precipitated the problem. She has tried nothing for the symptoms.    Past Medical History:  Diagnosis Date  . Anemia   . Anxiety   . Arthritis   . Brain lesion    2 types  . Cervical spondylosis with myelopathy   . Chest pain    Normal cardiac cath 5/09  . Congestive heart failure (CHF) ([place])   . Depression   . Dumping syndrome   . Edema   . Fatigue   . Fibromyalgia   . History of echocardiogram    a. Echo 03/20/16 (done at [facility] in [place], [place]):  mild LVH, EF 33%, normal diastolic function, mild LAE, MAC, RVSP 25 mmHg  .  Hyperlipidemia   . Hypertension   . Hypothyroidism   . Lumbar spondylosis   . Lymphedema    seeing specialist for this  . Restless leg syndrome   . Rotator cuff injury   . Stroke [facility])    TIAs "mini strokes"- unsure of last TIA  . Syncope   . Tremors of nervous system     Patient Active Problem List   Diagnosis Date Noted  . Unstable angina ([place]) 05/21/2016  . Chronic diastolic heart failure ([place]) 03/29/2016  . Normal coronary arteries 2009 01/14/2015  . Obesity-BMI 40 01/14/2015  . Restless leg 01/14/2015  . Chest pain 01/14/2015  . Back pain 01/14/2015  . Renal insufficiency 01/14/2015  . Dementia- mild memory issues 01/14/2015  . Occult blood positive stool 12/14/2014  . Anemia 12/14/2014  . Family history of colon cancer 12/14/2014  . Change in bowel habits 12/14/2014  . GERD (gastroesophageal reflux disease) 12/14/2014  . Dyspnea 05/17/2012  . Lymphedema 05/17/2012  . Weight gain 05/17/2012  . HTN (hypertension) 05/17/2012    Past Surgical History:  Procedure Laterality Date  . APPENDECTOMY  1966  . CARDIAC CATHETERIZATION N/A 05/25/2016   Procedure: Right/Left Heart Cath and Coronary Angiography;  Surgeon: Larey Dresser, MD;  Location: [place] CV [facility];  Service: Cardiovascular;  Laterality: N/A;  . CHOLECYSTECTOMY  2015  . CHOLECYSTECTOMY  2015  . CORONARY  ANGIOGRAM  2009   Normal coronaries  . Hip Bursa Surgery Bilateral   . KNEE ARTHROSCOPY Bilateral   . LUMBAR DISC SURGERY     x2  . LUMBAR FUSION    . RIGHT HEART CATH N/A 11/13/2016   Procedure: Right Heart Cath with Cardiomems;  Surgeon: Larey Dresser, MD;  Location: [place] CV [facility];  Service: Cardiovascular;  Laterality: N/A;  . ROTATOR CUFF REPAIR Bilateral   . TOTAL ABDOMINAL HYSTERECTOMY    . TOTAL KNEE ARTHROPLASTY Left   . TOTAL KNEE ARTHROPLASTY Right 11/18/2012   Procedure: TOTAL KNEE ARTHROPLASTY;  Surgeon: Yvette Rack., MD;  Location: [place];  Service: Orthopedics;  Laterality:  Right;    OB History    No data available       Home Medications    Prior to Admission medications   Medication Sig Start Date End Date Taking? Authorizing Provider  alendronate (FOSAMAX) 70 MG tablet Take 70 mg by mouth every Friday. Take with a full glass of water on an empty stomach.     [provider]  amLODipine (NORVASC) 10 MG tablet Take 1 tablet (10 mg total) by mouth daily. 10/20/16   Clegg, Amy D, NP  amoxicillin (AMOXIL) 500 MG capsule Take 2,000 mg by mouth as directed. Take 1 hour before dental procedure 11/06/16   [provider]  aspirin 81 MG tablet Take 81 mg by mouth at bedtime.    [provider]  baclofen (LIORESAL) 10 MG tablet Take 10 mg by mouth 3 (three) times daily.    [provider]  clopidogrel (PLAVIX) 75 MG tablet TAKE 1 TABLET DAILY 12/16/16   Bensimhon, Shaune Pascal, MD  donepezil (ARICEPT) 10 MG tablet Take 10 mg by mouth at bedtime.  11/28/14   [provider]  DULoxetine (CYMBALTA) 20 MG capsule Take 1 capsule by mouth daily. 11/09/16   [provider]  escitalopram (LEXAPRO) 20 MG tablet Take 30 mg by mouth daily with supper.  12/06/14   [provider]  fentaNYL (DURAGESIC - DOSED MCG/HR) 25 MCG/HR patch Place 25 mcg onto the skin every 3 (three) days.  07/22/15   [provider]  folic acid (FOLVITE) 1 MG tablet Take 1 mg by mouth 2 (two) times daily.    [provider]  HYDROcodone-acetaminophen (NORCO) 7.5-325 MG tablet Take 1 tablet by mouth every 6 (six) hours as needed for moderate pain.    [provider]  lamoTRIgine (LAMICTAL) 100 MG tablet Take 100 mg by mouth 2 (two) times daily.  11/03/14   [provider]  memantine (NAMENDA) 10 MG tablet Take 10 mg by mouth 2 (two) times daily. 04/06/16   [provider]  metolazone (ZAROXOLYN) 2.5 MG tablet Take 1 tablet (2.5 mg total) by mouth once a week. 12/09/16   Larey Dresser, MD  pravastatin  (PRAVACHOL) 80 MG tablet Take 80 mg by mouth at bedtime.  12/10/14   [provider]  ranitidine (ZANTAC) 150 MG tablet Take 150 mg by mouth 2 (two) times daily.    [provider]  rOPINIRole (REQUIP) 1 MG tablet Take 1 mg by mouth 3 (three) times daily.    [provider]  sennosides-docusate sodium (SENOKOT-S) 8.6-50 MG tablet Take 2 tablets by mouth at bedtime.    [provider]  spironolactone (ALDACTONE) 25 MG tablet Take 25 mg by mouth 2 (two) times daily.    [provider]  torsemide (DEMADEX) 20  MG tablet Take 4 tablets (80 mg total) by mouth daily. 12/09/16   Larey Dresser, MD    Family History Family History  Problem Relation Age of Onset  . Heart disease Mother   . Heart failure Mother   . Kidney disease Mother   . Lung cancer Father   . Colon cancer Brother   . Cancer Brother        malignant thymoma  . Heart disease Brother   . Tremor Maternal Grandmother 90  . Ovarian cancer Maternal Grandmother   . Uterine cancer Maternal Grandmother   . Irritable bowel syndrome Daughter   . Esophageal cancer Neg Hx   . Stomach cancer Neg Hx   . Rectal cancer Neg Hx     Social History Social History  Substance Use Topics  . Smoking status: Never Smoker  . Smokeless tobacco: Never Used  . Alcohol use No     Allergies   Hydromorphone   Review of Systems Review of Systems  Constitutional: Negative for chills and fever.  HENT: Negative for ear pain, rhinorrhea and sore throat.   Eyes: Negative for pain and visual disturbance.  Respiratory: Positive for cough and shortness of breath. Negative for hemoptysis, sputum production and wheezing.   Cardiovascular: Negative for chest pain, palpitations, orthopnea, claudication, syncope and PND.  Gastrointestinal: Negative for abdominal pain and vomiting.  Genitourinary: Negative for dysuria and hematuria.  Musculoskeletal: Negative for arthralgias, back pain and neck pain.        Right arm weakness, chronic  Skin: Negative for color change and rash.  Neurological: Negative for seizures, syncope and headaches.  All other systems reviewed and are negative.    Physical Exam Updated Vital Signs ED Triage Vitals  Enc Vitals Group     BP 01/10/17 1614 118/85     Pulse Rate 01/10/17 1614 65     Resp 01/10/17 1614 (!) 22     Temp 01/10/17 1614 98.7 F (37.1 C)     Temp src --      SpO2 01/10/17 1614 98 %     Weight --      Height --      Head Circumference --      Peak Flow --      Pain Score 01/10/17 1612 0     Pain Loc --      Pain Edu? --      Excl. in [place]? --     Physical Exam  Constitutional: She is oriented to person, place, and time. She appears well-developed and well-nourished. No distress.  HENT:  Head: Normocephalic and atraumatic.  Eyes: Conjunctivae are normal. Pupils are equal, round, and reactive to light.  Neck: Normal range of motion. Neck supple. No JVD present.  Cardiovascular: Normal rate, regular rhythm, normal heart sounds and intact distal pulses.   No murmur heard. Pulmonary/Chest: Effort normal and breath sounds normal. No respiratory distress. She has no wheezes. She has no rales.  Abdominal: Soft. She exhibits no distension. There is no tenderness.  Musculoskeletal: Normal range of motion. She exhibits edema (b/l legs).  Neurological: She is alert and oriented to person, place, and time. No cranial nerve deficit. She exhibits normal muscle tone. Coordination normal.  4+/5 weakness in RUE, 5+/5 strength elsewhere, normal sensation, no drift  Skin: Skin is warm and dry. Capillary refill takes less than 2 seconds.  Psychiatric: She has a normal mood and affect.  Nursing note and vitals reviewed.    ED Treatments /  Results  Labs (all labs ordered are listed, but only abnormal results are displayed) Labs Reviewed  BASIC METABOLIC PANEL - Abnormal; Notable for the following:       Result Value   Chloride 94 (*)    Glucose, Bld  110 (*)    BUN 66 (*)    Creatinine, Ser 2.36 (*)    Calcium 10.9 (*)    GFR calc non Af Amer 20 (*)    GFR calc Af Amer 23 (*)    All other components within normal limits  CBC  BRAIN NATRIURETIC PEPTIDE  I-STAT TROPOININ, ED    EKG  EKG Interpretation  Date/Time:  Sunday Jan 10 2017 16:12:12 EDT Ventricular Rate:  65 PR Interval:  146 QRS Duration: 108 QT Interval:  454 QTC Calculation: 472 R Axis:   -54 Text Interpretation:  Normal sinus rhythm Left anterior fascicular block Abnormal ECG No significant change since last tracing Confirmed by [facility] MD, PEDRO ([id number]) on 01/10/2017 6:39:41 PM       Radiology Dg Chest 2 View  Result Date: 01/10/2017 CLINICAL DATA:  Cough and congestion EXAM: CHEST  2 VIEW COMPARISON:  10/01/2016 FINDINGS: Cardiac shadow is within normal limits. A Cardiomems device is noted in in a branch of the left pulmonary artery in the lower lobe. No focal infiltrate or sizable effusion is seen. No bony abnormality is noted. IMPRESSION: No acute abnormality noted. Electronically Signed   By: Inez Catalina M.D.   On: 01/10/2017 17:02    Procedures Procedures (including critical care time)  EMERGENCY DEPARTMENT [place] LUNG EXAM "Study: Limited Ultrasound of the Lung and Thorax"  INDICATIONS: Dyspnea Multiple views of both lungs using sagittal orientation were obtained.  PERFORMED BY: Myself IMAGES ARCHIVED?: Yes LIMITATIONS: Body habitus VIEWS USED: Anterior lung fields INTERPRETATION: No pneumothorax, No pulmonary edema     EMERGENCY DEPARTMENT [place] CARDIAC EXAM "Study: Limited Ultrasound of the Heart and Pericardium"  INDICATIONS:Dyspnea Multiple views of the heart and pericardium were obtained in real-time with a multi-frequency probe.  PERFORMED YC:XKGYJE IMAGES ARCHIVED?: Yes LIMITATIONS:  Body habitus VIEWS USED: Parasternal long axis, Parasternal short axis and Apical 4 chamber  INTERPRETATION: Cardiac activity present, Pericardial  effusioin absent and Decreased contractility    Medications Ordered in ED Medications  torsemide (DEMADEX) tablet 40 mg (not administered)     Initial Impression / Assessment and Plan / ED Course  I have reviewed the triage vital signs and the nursing notes.  Pertinent labs & imaging results that were available during my care of the patient were reviewed by me and considered in my medical decision making (see chart for details).     [name] is a 71 year old female with history of heart failure, high blood pressure, tremors, TIA, high cholesterol who presents the ED with shortness of breath and cough. Patient's vitals at time of arrival to the ED unremarkable patient is without fever. Patient with worsening shortness of breath and cough this morning that is consistent to when she is volume overloaded according to patient and family. Patient has cardiomegaly as device that allows for close monitoring of her volume status. However, on the weekends that information is not available to receive from clinic. Patient weighs herself every day and had a 2 pound weight gain and called her cardiologist's office [facility] for an evaluation. Patient breathing comfortably on room air and normally ambulates with a walker and does not use oxygen at home. Patient on exam with clear breath sounds bilaterally,  mild edema in bilateral lower extremities and no obvious JVD. Bedside ultrasound showed no large pericardial effusion, right heart strain, slightly decreased cardiac function. Patient had no B lines on pulmonary ultrasound to suggest pulmonary edema. Patient denies chest pain, abdominal pain.   Patient had EKG performed that showed normal sinus rhythm with no signs of ischemia and initial troponin within normal limits and doubt ACS. Chest x-ray with no signs of pulmonary congestion, increased cardiomegaly. Chest x-ray shows no signs of pneumonia, pleural effusion, pneumothorax. Patient with unremarkable  CBC with no significant anemia or leukocytosis. Patient had BMP that showed unremarkable electrolytes and slightly increased creatinine however appears around baseline for patient as creatinine has been between 2 and 3 for several months. Wells score 0 and doubt PE. BNP wnl. No acute signs of volume overload. Called cardiology and they recommend 40 mg of extra torsemide and will follow up with patient tomorrow to figure out long term plan. Patient ambulated in the ED without any issues and had no hypoxia. Discharged from the ED in good condition and told to return to the ED if symptoms worsen.    Final Clinical Impressions(s) / ED Diagnoses   Final diagnoses:  Chronic congestive heart failure, unspecified heart failure type ([place])  Shortness of breath    New Prescriptions New Prescriptions   No medications on file     Lennice Sites, DO 01/10/17 1955

## 2017-01-10 NOTE — ED Triage Notes (Signed)
Pt complaining of SOB and cough since this morning. Pt states hx of CHF. Pt states increased swelling in abdomen.

## 2017-01-10 NOTE — ED Provider Notes (Signed)
I have personally seen and examined the patient. I have reviewed the documentation on PMH/FH/Soc Hx. I have discussed the plan of care with the resident and patient.  I have reviewed and agree with the resident's documentation. Please see associated encounter note.   EKG Interpretation  Date/Time:  Sunday Jan 10 2017 16:12:12 EDT Ventricular Rate:  65 PR Interval:  146 QRS Duration: 108 QT Interval:  454 QTC Calculation: 472 R Axis:   -54 Text Interpretation:  Normal sinus rhythm Left anterior fascicular block Abnormal ECG No significant change since last tracing Confirmed by [facility] MD, PEDRO ([id number]) on 01/10/2017 6:39:41 PM         Cardama, Grayce Sessions, MD 01/10/17 2221

## 2017-01-10 NOTE — ED Notes (Signed)
Pt ambulate in hallway with walker from home. Pt O2 sat prior to ambulation was 96%. Pt was able to maintain O2 sat of 96% while ambulating with only stand by assist.

## 2017-01-12 ENCOUNTER — Ambulatory Visit: Payer: Medicare Other | Admitting: Physical Therapy

## 2017-01-12 ENCOUNTER — Telehealth (HOSPITAL_COMMUNITY): Payer: Self-pay | Admitting: *Deleted

## 2017-01-12 ENCOUNTER — Encounter: Payer: Self-pay | Admitting: Physical Therapy

## 2017-01-12 DIAGNOSIS — G8929 Other chronic pain: Secondary | ICD-10-CM

## 2017-01-12 DIAGNOSIS — M25562 Pain in left knee: Secondary | ICD-10-CM

## 2017-01-12 DIAGNOSIS — R262 Difficulty in walking, not elsewhere classified: Secondary | ICD-10-CM

## 2017-01-12 DIAGNOSIS — M6281 Muscle weakness (generalized): Secondary | ICD-10-CM

## 2017-01-12 NOTE — Telephone Encounter (Signed)
Pt called to check on her cardiomems readings and to let us know she was in ER over weekend.  She states she saw Dr Edrick Oh today, he felt her SOB was from allergies, pt agrees with that and states he gave her a nose spray and inhaler to use as needed and did a bunch of lab work to recheck kidney function. Advised Cardiomems readings were good, Sun was 86, yesterday 10 and today 14.

## 2017-01-12 NOTE — Therapy (Signed)
[facility] [place], [place], [address] Phone: [phone]   Fax:  [phone]  Physical Therapy Treatment  Patient Details  Name: [name] MRN: [id number] Date of Birth: 01-27-1946 Referring Provider: Earlie Server, MD  Encounter Date: 01/12/2017      PT End of Session - 01/12/17 0948    Visit Number 23   Number of Visits 30   Date for PT Re-Evaluation 01/20/17   PT Start Time 0947   PT Stop Time 1030   PT Time Calculation (min) 43 min   Activity Tolerance Patient tolerated treatment well   Behavior During Therapy [facility] for tasks assessed/performed      Past Medical History:  Diagnosis Date  . Anemia   . Anxiety   . Arthritis   . Brain lesion    2 types  . Cervical spondylosis with myelopathy   . Chest pain    Normal cardiac cath 5/09  . Congestive heart failure (CHF) ([place])   . Depression   . Dumping syndrome   . Edema   . Fatigue   . Fibromyalgia   . History of echocardiogram    a. Echo 03/20/16 (done at [facility] in [place], [place]):  mild LVH, EF 38%, normal diastolic function, mild LAE, MAC, RVSP 25 mmHg  . Hyperlipidemia   . Hypertension   . Hypothyroidism   . Lumbar spondylosis   . Lymphedema    seeing specialist for this  . Restless leg syndrome   . Rotator cuff injury   . Stroke [facility])    TIAs "mini strokes"- unsure of last TIA  . Syncope   . Tremors of nervous system     Past Surgical History:  Procedure Laterality Date  . APPENDECTOMY  1966  . CARDIAC CATHETERIZATION N/A 05/25/2016   Procedure: Right/Left Heart Cath and Coronary Angiography;  Surgeon: Larey Dresser, MD;  Location: [place] [facility];  Service: Cardiovascular;  Laterality: N/A;  . CHOLECYSTECTOMY  2015  . CHOLECYSTECTOMY  2015  . CORONARY ANGIOGRAM  2009   Normal coronaries  . Hip Bursa Surgery Bilateral   . KNEE ARTHROSCOPY Bilateral   . LUMBAR DISC SURGERY     x2  . LUMBAR FUSION    . RIGHT HEART CATH  N/A 11/13/2016   Procedure: Right Heart Cath with Cardiomems;  Surgeon: Larey Dresser, MD;  Location: [place] [facility];  Service: Cardiovascular;  Laterality: N/A;  . ROTATOR CUFF REPAIR Bilateral   . TOTAL ABDOMINAL HYSTERECTOMY    . TOTAL KNEE ARTHROPLASTY Left   . TOTAL KNEE ARTHROPLASTY Right 11/18/2012   Procedure: TOTAL KNEE ARTHROPLASTY;  Surgeon: Yvette Rack., MD;  Location: [place];  Service: Orthopedics;  Laterality: Right;    There were no vitals filed for this visit.      Subjective Assessment - 01/12/17 0947    Subjective Reports that she feels better following ED visit this weekend. Reports that it was more allergies that caused the flare up. Reports that Dr. French Ana sent her for a second opinion with Dr. Ninfa Linden that she is waiting to be scheduled.   Pertinent History Bilateral total knee replacements. Bilateral rotator cuff repairs   Limitations Walking   How long can you stand comfortably? Less than 5-10 minutes.   How long can you walk comfortably? < 5 min   Patient Stated Goals Get out of pain, walk better.             [place]  PT Assessment - 01/12/17 0001      Assessment   Medical Diagnosis left knee pain   Next MD Visit 01/08/2017     Restrictions   Weight Bearing Restrictions No                     [facility] Adult PT Treatment/Exercise - 01/12/17 0001      Ambulation/Gait   Ambulation/Gait Yes   Ambulation/Gait Assistance 5: Supervision   Ambulation/Gait Assistance Details VCs for increased stride, hip flexion, gait sequence   Ambulation Distance (Feet) 100 Feet   Assistive device Small based quad cane   Gait Pattern Step-to pattern;Decreased arm swing - left;Decreased step length - right;Decreased step length - left;Decreased stance time - left;Decreased stride length;Decreased hip/knee flexion - right;Decreased hip/knee flexion - left;Decreased dorsiflexion - right;Decreased dorsiflexion - left;Decreased trunk rotation;Poor foot clearance  - left   Ambulation Surface Level;Indoor     Knee/Hip Exercises: Aerobic   Nustep L6 x15 min     Knee/Hip Exercises: Standing   Forward Step Up Both;3 sets;10 reps;Hand Hold: 2;Step Height: 6"   Functional Squat 2 sets;10 reps  Mini squat   Other Standing Knee Exercises Sidestep with green theraband x5 reps in // bars     Modalities   Modalities Electrical Stimulation;Cryotherapy     Cryotherapy   Number Minutes Cryotherapy 15 Minutes   Cryotherapy Location Knee   Type of Cryotherapy Ice pack  with two pillowcases and pants leg for added layers      Electrical Stimulation   Electrical Stimulation Location L knee   Electrical Stimulation Action Pre-Mod   Electrical Stimulation Parameters 80-150 hz x15 min   Electrical Stimulation Goals Pain                  PT Short Term Goals - 10/05/16 1037      PT SHORT TERM GOAL #1   Title STG's=LTG's.           PT Long Term Goals - 12/07/16 1033      PT LONG TERM GOAL #1   Title Independent with a HEP.   Time 8   Period Weeks   Status Partially Met     PT LONG TERM GOAL #2   Title Stand 20 minutes with left knee pain not > 3/10.   Baseline pt reporting 4-5/10 with doing dishes and standing for 20 minutes   Time 8   Period Weeks   Status On-going     PT LONG TERM GOAL #3   Title Perform ADL's with pain not > 3/10.   Baseline Pt reporting pain vaires with ADL's from 3-8/10.    Time 8   Period Weeks   Status On-going     PT LONG TERM GOAL #4   Title Pt to be walking 10 minutes twice a day to promote lymphatic circulation    Time 6   Period Weeks   Status On-going     PT LONG TERM GOAL #5   Title Pt to state she is able to walk for 10 minutes without difficulty ( pt limited to 10 minutes due to back issues)   Time 8   Period Weeks   Status On-going               Plan - 01/12/17 1025    Clinical Impression Statement Patient did well today as more reps of standing exercises completed and with  patient interest in trying ambulation with SBQC. Patient able  to tolerate 100 ft of ambulation with SBQC although decreased hip flexion and stride length noted. Patient also required mod VCs throughout gait training for gait sequencing. Patient fatigued in approximately 50 feet but able to ambulate 100 ft total prior to returning to rollator. Patient very fatigued following therex and gait training today. Normal modalities response noted following removal of the modalities with additional layers to prevent lymphedema complications.   Rehab Potential Good   PT Frequency 2x / week   PT Duration 8 weeks   PT Treatment/Interventions Iontophoresis 66m/ml Dexamethasone;Moist Heat;Patient/family education;Therapeutic exercise;Therapeutic activities;Manual techniques;Vasopneumatic Device;Dry needling;Cryotherapy;Gait training   PT Next Visit Plan Continue standing exercises with attemp at gait [facility].   PT Home Exercise Plan Instructed in TENS use, continue SLR, sit to stand, supine marching, LAQ   Consulted and Agree with Plan of Care Patient      Patient will benefit from skilled therapeutic intervention in order to improve the following deficits and impairments:  Decreased activity tolerance, Decreased strength, Pain, Postural dysfunction, Impaired perceived functional ability, Decreased balance, Decreased mobility  Visit Diagnosis: Difficulty in walking, not elsewhere classified  Chronic pain of left knee  Muscle weakness (generalized)  Difficulty walking     Problem List Patient Active Problem List   Diagnosis Date Noted  . Unstable angina ([place] 05/21/2016  . Chronic diastolic heart failure ([place] 03/29/2016  . Normal coronary arteries 2009 01/14/2015  . Obesity-BMI 40 01/14/2015  . Restless leg 01/14/2015  . Chest pain 01/14/2015  . Back pain 01/14/2015  . Renal insufficiency 01/14/2015  . Dementia- mild memory issues 01/14/2015  . Occult blood positive  stool 12/14/2014  . Anemia 12/14/2014  . Family history of colon cancer 12/14/2014  . Change in bowel habits 12/14/2014  . GERD (gastroesophageal reflux disease) 12/14/2014  . Dyspnea 05/17/2012  . Lymphedema 05/17/2012  . Weight gain 05/17/2012  . HTN (hypertension) 05/17/2012    [name] PTA 01/12/2017, 10:43 AM  [facility] [address]Phone: [phone]  Fax:  [phone] Name: [name] EBRAHIMMRN: [id number] of Birth: [birth year]

## 2017-01-14 ENCOUNTER — Ambulatory Visit: Payer: Medicare Other | Admitting: Physical Therapy

## 2017-01-14 ENCOUNTER — Encounter: Payer: Self-pay | Admitting: Physical Therapy

## 2017-01-14 DIAGNOSIS — M6281 Muscle weakness (generalized): Secondary | ICD-10-CM

## 2017-01-14 DIAGNOSIS — M25562 Pain in left knee: Secondary | ICD-10-CM

## 2017-01-14 DIAGNOSIS — G8929 Other chronic pain: Secondary | ICD-10-CM

## 2017-01-14 DIAGNOSIS — R262 Difficulty in walking, not elsewhere classified: Secondary | ICD-10-CM

## 2017-01-14 NOTE — Therapy (Signed)
[facility] [place], [place], [address] Phone: [phone]   Fax:  [phone]  Physical Therapy Treatment  Patient Details  Name: [name] MRN: [id number] Date of Birth: 11-28-1945 Referring Provider: Earlie Server, MD  Encounter Date: 01/14/2017      PT End of Session - 01/14/17 0908    Visit Number 24   Number of Visits 30   Date for PT Re-Evaluation 01/20/17   PT Start Time 0906   PT Stop Time 0952   PT Time Calculation (min) 46 min   Activity Tolerance Patient tolerated treatment well   Behavior During Therapy [facility] for tasks assessed/performed      Past Medical History:  Diagnosis Date  . Anemia   . Anxiety   . Arthritis   . Brain lesion    2 types  . Cervical spondylosis with myelopathy   . Chest pain    Normal cardiac cath 5/09  . Congestive heart failure (CHF) ([place])   . Depression   . Dumping syndrome   . Edema   . Fatigue   . Fibromyalgia   . History of echocardiogram    a. Echo 03/20/16 (done at [facility] in [place], [place]):  mild LVH, EF 60%, normal diastolic function, mild LAE, MAC, RVSP 25 mmHg  . Hyperlipidemia   . Hypertension   . Hypothyroidism   . Lumbar spondylosis   . Lymphedema    seeing specialist for this  . Restless leg syndrome   . Rotator cuff injury   . Stroke [facility])    TIAs "mini strokes"- unsure of last TIA  . Syncope   . Tremors of nervous system     Past Surgical History:  Procedure Laterality Date  . APPENDECTOMY  1966  . CARDIAC CATHETERIZATION N/A 05/25/2016   Procedure: Right/Left Heart Cath and Coronary Angiography;  Surgeon: Larey Dresser, MD;  Location: [place] [facility];  Service: Cardiovascular;  Laterality: N/A;  . CHOLECYSTECTOMY  2015  . CHOLECYSTECTOMY  2015  . CORONARY ANGIOGRAM  2009   Normal coronaries  . Hip Bursa Surgery Bilateral   . KNEE ARTHROSCOPY Bilateral   . LUMBAR DISC SURGERY     x2  . LUMBAR FUSION    . RIGHT HEART CATH  N/A 11/13/2016   Procedure: Right Heart Cath with Cardiomems;  Surgeon: Larey Dresser, MD;  Location: [place] [facility];  Service: Cardiovascular;  Laterality: N/A;  . ROTATOR CUFF REPAIR Bilateral   . TOTAL ABDOMINAL HYSTERECTOMY    . TOTAL KNEE ARTHROPLASTY Left   . TOTAL KNEE ARTHROPLASTY Right 11/18/2012   Procedure: TOTAL KNEE ARTHROPLASTY;  Surgeon: Yvette Rack., MD;  Location: [place];  Service: Orthopedics;  Laterality: Right;    There were no vitals filed for this visit.      Subjective Assessment - 01/14/17 0907    Subjective Reports that her knees are feeling alright this morning. Has appointment with Dr. Ninfa Linden next week per patient report. Reports that she talked with someone from Dr. Alvester Morin office and states that he wishes for her to continue PT until her appointment with Dr. Ninfa Linden.   Pertinent History Bilateral total knee replacements. Bilateral rotator cuff repairs   Limitations Walking   How long can you stand comfortably? Less than 5-10 minutes.   How long can you walk comfortably? < 5 min   Patient Stated Goals Get out of pain, walk better.    Currently in Pain? Yes  Pain Score 2    Pain Location Knee   Pain Orientation Right;Left   Pain Descriptors / Indicators Discomfort   Pain Type Chronic pain   Pain Onset More than a month ago            [facility] PT Assessment - 01/14/17 0001      Assessment   Medical Diagnosis left knee pain   Next MD Visit 12/2016  with Dr. Ninfa Linden per recommndation from Dr. French Ana     Restrictions   Weight Bearing Restrictions No                     [facility] Adult PT Treatment/Exercise - 01/14/17 0001      Ambulation/Gait   Ambulation/Gait Yes   Ambulation/Gait Assistance 5: Supervision   Ambulation/Gait Assistance Details VCs for heel strike   Ambulation Distance (Feet) 141 Feet   Assistive device Small based quad cane   Gait Pattern Step-to pattern;Decreased arm swing - left;Decreased step length -  right;Decreased step length - left;Decreased stance time - left;Decreased stride length;Decreased hip/knee flexion - right;Decreased hip/knee flexion - left;Decreased dorsiflexion - right;Decreased dorsiflexion - left;Decreased trunk rotation;Poor foot clearance - left   Ambulation Surface Level;Indoor     Knee/Hip Exercises: Aerobic   Nustep L6 x10 min     Knee/Hip Exercises: Machines for Strengthening   Cybex Knee Extension 10# 3x10 reps     Knee/Hip Exercises: Standing   Other Standing Knee Exercises Sidestep with green theraband x5 reps in // bars     Modalities   Modalities Electrical Stimulation;Cryotherapy     Cryotherapy   Number Minutes Cryotherapy 15 Minutes   Cryotherapy Location Knee   Type of Cryotherapy Ice pack  Two pillowcases and pants leg for added layers     Electrical Stimulation   Electrical Stimulation Location L knee   Electrical Stimulation Action Pre-Mod   Electrical Stimulation Parameters 80-150 hz x15 min   Electrical Stimulation Goals Pain                  PT Short Term Goals - 10/05/16 1037      PT SHORT TERM GOAL #1   Title STG's=LTG's.           PT Long Term Goals - 12/07/16 1033      PT LONG TERM GOAL #1   Title Independent with a HEP.   Time 8   Period Weeks   Status Partially Met     PT LONG TERM GOAL #2   Title Stand 20 minutes with left knee pain not > 3/10.   Baseline pt reporting 4-5/10 with doing dishes and standing for 20 minutes   Time 8   Period Weeks   Status On-going     PT LONG TERM GOAL #3   Title Perform ADL's with pain not > 3/10.   Baseline Pt reporting pain vaires with ADL's from 3-8/10.    Time 8   Period Weeks   Status On-going     PT LONG TERM GOAL #4   Title Pt to be walking 10 minutes twice a day to promote lymphatic circulation    Time 6   Period Weeks   Status On-going     PT LONG TERM GOAL #5   Title Pt to state she is able to walk for 10 minutes without difficulty ( pt limited to  10 minutes due to back issues)   Time 8   Period Weeks   Status On-going  Plan - 01/14/17 0948    Clinical Impression Statement Patient demonstrated greater improvements today as she was able to ambulate greater distance with [facility]. Patient also able to complete knee machine strengthening for quad with only fatigue reported. Patient encouraged during gait training for heel strike as patient ambulates with B foot flat. Patient appeared much more confident with gait sequence in which she was independent with today. Normal modalities response noted following removal of the modalities.   Rehab Potential Good   PT Frequency 2x / week   PT Duration 8 weeks   PT Treatment/Interventions Iontophoresis 50m/ml Dexamethasone;Moist Heat;Patient/family education;Therapeutic exercise;Therapeutic activities;Manual techniques;Vasopneumatic Device;Dry needling;Cryotherapy;Gait training   PT Next Visit Plan Continue standing exercises with attemp at gait [facility].   PT Home Exercise Plan Instructed in TENS use, continue SLR, sit to stand, supine marching, LAQ   Consulted and Agree with Plan of Care Patient      Patient will benefit from skilled therapeutic intervention in order to improve the following deficits and impairments:  Decreased activity tolerance, Decreased strength, Pain, Postural dysfunction, Impaired perceived functional ability, Decreased balance, Decreased mobility  Visit Diagnosis: Difficulty in walking, not elsewhere classified  Chronic pain of left knee  Muscle weakness (generalized)  Difficulty walking     Problem List Patient Active Problem List   Diagnosis Date Noted  . Unstable angina ([place] 05/21/2016  . Chronic diastolic heart failure ([place] 03/29/2016  . Normal coronary arteries 2009 01/14/2015  . Obesity-BMI 40 01/14/2015  . Restless leg 01/14/2015  . Chest pain 01/14/2015  . Back pain 01/14/2015  . Renal insufficiency  01/14/2015  . Dementia- mild memory issues 01/14/2015  . Occult blood positive stool 12/14/2014  . Anemia 12/14/2014  . Family history of colon cancer 12/14/2014  . Change in bowel habits 12/14/2014  . GERD (gastroesophageal reflux disease) 12/14/2014  . Dyspnea 05/17/2012  . Lymphedema 05/17/2012  . Weight gain 05/17/2012  . HTN (hypertension) 05/17/2012    KWynelle Fanny PTA 01/14/2017, 10:13 AM  [facility] [address]Phone: [phone]  Fax:  [phone] Name: CCHELCEY CAPUTOMRN: [id number] of Birth: 6Dec 26, 1947

## 2017-01-20 ENCOUNTER — Encounter: Payer: Self-pay | Admitting: Physical Therapy

## 2017-01-20 ENCOUNTER — Ambulatory Visit: Payer: Medicare Other | Admitting: Physical Therapy

## 2017-01-20 DIAGNOSIS — R262 Difficulty in walking, not elsewhere classified: Secondary | ICD-10-CM

## 2017-01-20 DIAGNOSIS — M6281 Muscle weakness (generalized): Secondary | ICD-10-CM

## 2017-01-20 DIAGNOSIS — G8929 Other chronic pain: Secondary | ICD-10-CM

## 2017-01-20 DIAGNOSIS — M25562 Pain in left knee: Secondary | ICD-10-CM

## 2017-01-20 NOTE — Therapy (Signed)
[facility], [place], [address] Phone: [phone]   Fax:  [phone]  Physical Therapy Treatment  Patient Details  Name: [name] MRN: [id number] Date of Birth: [birth year] Referring Provider: Earlie Server, MD  Encounter Date: 01/20/2017      PT End of Session - 01/20/17 0911    Visit Number 25   Number of Visits 30   Date for PT Re-Evaluation 01/20/17   PT Start Time 0906   PT Stop Time 1002   PT Time Calculation (min) 56 min   Activity Tolerance Patient tolerated treatment well   Behavior During Therapy [facility] for tasks assessed/performed      Past Medical History:  Diagnosis Date  . Anemia   . Anxiety   . Arthritis   . Brain lesion    2 types  . Cervical spondylosis with myelopathy   . Chest pain    Normal cardiac cath 5/09  . Congestive heart failure (CHF) ([place])   . Depression   . Dumping syndrome   . Edema   . Fatigue   . Fibromyalgia   . History of echocardiogram    a. Echo 03/20/16 (done at [facility] in [place], [place]):  mild LVH, EF 85%, normal diastolic function, mild LAE, MAC, RVSP 25 mmHg  . Hyperlipidemia   . Hypertension   . Hypothyroidism   . Lumbar spondylosis   . Lymphedema    seeing specialist for this  . Restless leg syndrome   . Rotator cuff injury   . Stroke [facility])    TIAs "mini strokes"- unsure of last TIA  . Syncope   . Tremors of nervous system     Past Surgical History:  Procedure Laterality Date  . APPENDECTOMY  1966  . CARDIAC CATHETERIZATION N/A 05/25/2016   Procedure: Right/Left Heart Cath and Coronary Angiography;  Surgeon: Larey Dresser, MD;  Location: [place] [facility];  Service: Cardiovascular;  Laterality: N/A;  . CHOLECYSTECTOMY  2015  . CHOLECYSTECTOMY  2015  . CORONARY ANGIOGRAM  2009   Normal coronaries  . Hip Bursa Surgery Bilateral   . KNEE ARTHROSCOPY Bilateral   . LUMBAR DISC SURGERY     x2  . LUMBAR FUSION    . RIGHT HEART CATH  N/A 11/13/2016   Procedure: Right Heart Cath with Cardiomems;  Surgeon: Larey Dresser, MD;  Location: [place] [facility];  Service: Cardiovascular;  Laterality: N/A;  . ROTATOR CUFF REPAIR Bilateral   . TOTAL ABDOMINAL HYSTERECTOMY    . TOTAL KNEE ARTHROPLASTY Left   . TOTAL KNEE ARTHROPLASTY Right 11/18/2012   Procedure: TOTAL KNEE ARTHROPLASTY;  Surgeon: Yvette Rack., MD;  Location: [place];  Service: Orthopedics;  Laterality: Right;    There were no vitals filed for this visit.      Subjective Assessment - 01/20/17 0911    Subjective Reports that she sees Dr. Ninfa Linden in early June.   Pertinent History Bilateral total knee replacements. Bilateral rotator cuff repairs   Limitations Walking   How long can you stand comfortably? Less than 5-10 minutes.   How long can you walk comfortably? < 5 min   Patient Stated Goals Get out of pain, walk better.    Currently in Pain? No/denies            [facility] PT Assessment - 01/20/17 0001      Assessment   Medical Diagnosis left knee pain   Next MD Visit 01/2017  Restrictions   Weight Bearing Restrictions No                     [facility] Adult PT Treatment/Exercise - 01/20/17 0001      Ambulation/Gait   Ambulation/Gait Yes   Ambulation/Gait Assistance 5: Supervision   Ambulation/Gait Assistance Details VCs for increased speed, increased stride length, heel strike   Ambulation Distance (Feet) 253 Feet   Assistive device Small based quad cane   Gait Pattern Step-to pattern;Decreased arm swing - left;Decreased step length - left;Decreased stance time - left;Right foot flat;Left foot flat;Decreased trunk rotation;Narrow base of support   Ambulation Surface Level;Indoor     Knee/Hip Exercises: Aerobic   Nustep L7 x10 min, seat 10     Knee/Hip Exercises: Machines for Strengthening   Cybex Knee Extension 10# 3x10 reps     Knee/Hip Exercises: Standing   Forward Step Up Both;2 sets;10 reps;Hand Hold: 2;Step Height: 6"      Modalities   Modalities Electrical Stimulation;Cryotherapy     Cryotherapy   Number Minutes Cryotherapy 15 Minutes   Cryotherapy Location Knee   Type of Cryotherapy Ice pack     Electrical Stimulation   Electrical Stimulation Location L knee   Electrical Stimulation Action Pre-mod   Electrical Stimulation Parameters 80-150 hz x15 min   Electrical Stimulation Goals Pain                  PT Short Term Goals - 10/05/16 1037      PT SHORT TERM GOAL #1   Title STG's=LTG's.           PT Long Term Goals - 12/07/16 1033      PT LONG TERM GOAL #1   Title Independent with a HEP.   Time 8   Period Weeks   Status Partially Met     PT LONG TERM GOAL #2   Title Stand 20 minutes with left knee pain not > 3/10.   Baseline pt reporting 4-5/10 with doing dishes and standing for 20 minutes   Time 8   Period Weeks   Status On-going     PT LONG TERM GOAL #3   Title Perform ADL's with pain not > 3/10.   Baseline Pt reporting pain vaires with ADL's from 3-8/10.    Time 8   Period Weeks   Status On-going     PT LONG TERM GOAL #4   Title Pt to be walking 10 minutes twice a day to promote lymphatic circulation    Time 6   Period Weeks   Status On-going     PT LONG TERM GOAL #5   Title Pt to state she is able to walk for 10 minutes without difficulty ( pt limited to 10 minutes due to back issues)   Time 8   Period Weeks   Status On-going               Plan - 01/20/17 0957    Clinical Impression Statement Patient tolerated today's treatment well as she was advanced throughout treatment. Patient able to tolerate L7 on nustep fairly well with fatigue noted mostly and LEs completed NuStep only predmoniately. Patient able to ambulate at total of 253 ft in clinic today throughout treatment with [facility] with VCs for increased speed, increased stride length, heel strike but much improved confidence with gait sequence and ambulation with SBQC. Normal modalities response  noted following removal of the modalities. Required rest breaks during treatment due to  increased fatigue.   Rehab Potential Good   PT Frequency 2x / week   PT Duration 8 weeks   PT Treatment/Interventions Iontophoresis 31m/ml Dexamethasone;Moist Heat;Patient/family education;Therapeutic exercise;Therapeutic activities;Manual techniques;Vasopneumatic Device;Dry needling;Cryotherapy;Gait training   PT Next Visit Plan Continue standing exercises with attemp [facility]   PT Home Exercise Plan Instructed in TENS use, continue SLR, sit to stand, supine marching, LAQ   Consulted and Agree with Plan of Care Patient      Patient will benefit from skilled therapeutic intervention in order to improve the following deficits and impairments:  Decreased activity tolerance, Decreased strength, Pain, Postural dysfunction, Impaired perceived functional ability, Decreased balance, Decreased mobility  Visit Diagnosis: Difficulty in walking, not elsewhere classified  Chronic pain of left knee  Muscle weakness (generalized)  Difficulty walking     Problem List Patient Active Problem List   Diagnosis Date Noted  . Unstable angina ([place] 05/21/2016  . Chronic diastolic heart failure ([place] 03/29/2016  . Normal coronary arteries 2009 01/14/2015  . Obesity-BMI 40 01/14/2015  . Restless leg 01/14/2015  . Chest pain 01/14/2015  . Back pain 01/14/2015  . Renal insufficiency 01/14/2015  . Dementia- mild memory issues 01/14/2015  . Occult blood positive stool 12/14/2014  . Anemia 12/14/2014  . Family history of colon cancer 12/14/2014  . Change in bowel habits 12/14/2014  . GERD (gastroesophageal reflux disease) 12/14/2014  . Dyspnea 05/17/2012  . Lymphedema 05/17/2012  . Weight gain 05/17/2012  . HTN (hypertension) 05/17/2012    KWynelle Fanny PTA 01/20/2017, 10:13 AM  [facility]MC[address] [place]  [address]Phone: [phone]  Fax:  [phone] Name: [name]: [id number] of Birth: [birth year]

## 2017-01-22 ENCOUNTER — Ambulatory Visit: Payer: Medicare Other | Attending: Orthopedic Surgery | Admitting: Physical Therapy

## 2017-01-22 ENCOUNTER — Encounter: Payer: Self-pay | Admitting: Physical Therapy

## 2017-01-22 DIAGNOSIS — M25562 Pain in left knee: Secondary | ICD-10-CM | POA: Diagnosis present

## 2017-01-22 DIAGNOSIS — M6281 Muscle weakness (generalized): Secondary | ICD-10-CM | POA: Diagnosis present

## 2017-01-22 DIAGNOSIS — R262 Difficulty in walking, not elsewhere classified: Secondary | ICD-10-CM | POA: Diagnosis not present

## 2017-01-22 DIAGNOSIS — G8929 Other chronic pain: Secondary | ICD-10-CM

## 2017-01-22 NOTE — Therapy (Signed)
[facility], [place], [address] Phone: [phone]   Fax:  [phone]  Physical Therapy Treatment  Patient Details  Name: [name] MRN: [id number] Date of Birth: [birth year] Referring Provider: Earlie Server, MD  Encounter Date: 01/22/2017      PT End of Session - 01/22/17 0954    Visit Number 26   Number of Visits 30   Date for PT Re-Evaluation 01/20/17   PT Start Time 0902   PT Stop Time 0957   PT Time Calculation (min) 55 min   Activity Tolerance Patient tolerated treatment well   Behavior During Therapy [facility] for tasks assessed/performed      Past Medical History:  Diagnosis Date  . Anemia   . Anxiety   . Arthritis   . Brain lesion    2 types  . Cervical spondylosis with myelopathy   . Chest pain    Normal cardiac cath 5/09  . Congestive heart failure (CHF) ([place])   . Depression   . Dumping syndrome   . Edema   . Fatigue   . Fibromyalgia   . History of echocardiogram    a. Echo 03/20/16 (done at [facility] in [place], [place]):  mild LVH, EF 93%, normal diastolic function, mild LAE, MAC, RVSP 25 mmHg  . Hyperlipidemia   . Hypertension   . Hypothyroidism   . Lumbar spondylosis   . Lymphedema    seeing specialist for this  . Restless leg syndrome   . Rotator cuff injury   . Stroke [facility])    TIAs "mini strokes"- unsure of last TIA  . Syncope   . Tremors of nervous system     Past Surgical History:  Procedure Laterality Date  . APPENDECTOMY  1966  . CARDIAC CATHETERIZATION N/A 05/25/2016   Procedure: Right/Left Heart Cath and Coronary Angiography;  Surgeon: Larey Dresser, MD;  Location: [place] [facility];  Service: Cardiovascular;  Laterality: N/A;  . CHOLECYSTECTOMY  2015  . CHOLECYSTECTOMY  2015  . CORONARY ANGIOGRAM  2009   Normal coronaries  . Hip Bursa Surgery Bilateral   . KNEE ARTHROSCOPY Bilateral   . LUMBAR DISC SURGERY     x2  . LUMBAR FUSION    . RIGHT HEART CATH N/A  11/13/2016   Procedure: Right Heart Cath with Cardiomems;  Surgeon: Larey Dresser, MD;  Location: [place] [facility];  Service: Cardiovascular;  Laterality: N/A;  . ROTATOR CUFF REPAIR Bilateral   . TOTAL ABDOMINAL HYSTERECTOMY    . TOTAL KNEE ARTHROPLASTY Left   . TOTAL KNEE ARTHROPLASTY Right 11/18/2012   Procedure: TOTAL KNEE ARTHROPLASTY;  Surgeon: Yvette Rack., MD;  Location: [place];  Service: Orthopedics;  Laterality: Right;    There were no vitals filed for this visit.      Subjective Assessment - 01/22/17 0905    Subjective Reports some soreness in R knee today. Reports that she stood yesterday for 13 minutes and her knees did good and her back pain relieved itself after a few minutes.   Pertinent History Bilateral total knee replacements. Bilateral rotator cuff repairs   Limitations Walking   How long can you stand comfortably? Less than 5-10 minutes.   How long can you walk comfortably? < 5 min   Patient Stated Goals Get out of pain, walk better.    Currently in Pain? Yes   Pain Score 3    Pain Location Knee   Pain Orientation Right;Left  Pain Descriptors / Indicators Sore   Pain Type Chronic pain   Pain Onset More than a month ago            [facility] PT Assessment - 01/22/17 0001      Assessment   Medical Diagnosis left knee pain   Next MD Visit 01/2017     Restrictions   Weight Bearing Restrictions No     ROM / Strength   AROM / PROM / Strength --     AROM   Overall AROM  --   AROM Assessment Site --   Right/Left Knee --   Left Knee Extension --   Left Knee Flexion --                     [facility] Adult PT Treatment/Exercise - 01/22/17 0001      Ambulation/Gait   Ambulation/Gait Yes   Ambulation/Gait Assistance 5: Supervision   Ambulation/Gait Assistance Details VCs for increased stride length   Ambulation Distance (Feet) 328 Feet   Assistive device Small based quad cane;Straight cane   Gait Pattern Step-to pattern;Decreased arm swing -  left;Decreased stride length;Decreased stance time - left;Decreased trunk rotation;Narrow base of support   Ambulation Surface Level;Indoor     Knee/Hip Exercises: Aerobic   Nustep L7 x10 min, seat 10     Knee/Hip Exercises: Machines for Strengthening   Cybex Knee Extension 10# 3x10 reps     Knee/Hip Exercises: Standing   Lateral Step Up Both;1 set;15 reps;Hand Hold: 2;Step Height: 6"   Forward Step Up Both;2 sets;10 reps;Hand Hold: 2;Step Height: 6"     Modalities   Modalities Electrical Stimulation;Cryotherapy     Cryotherapy   Number Minutes Cryotherapy 15 Minutes   Cryotherapy Location Knee   Type of Cryotherapy Ice pack     Electrical Stimulation   Electrical Stimulation Location L knee   Electrical Stimulation Action Pre-Mod   Electrical Stimulation Parameters 80-150 hz x15 min   Electrical Stimulation Goals Pain                  PT Short Term Goals - 10/05/16 1037      PT SHORT TERM GOAL #1   Title STG's=LTG's.           PT Long Term Goals - 12/07/16 1033      PT LONG TERM GOAL #1   Title Independent with a HEP.   Time 8   Period Weeks   Status Partially Met     PT LONG TERM GOAL #2   Title Stand 20 minutes with left knee pain not > 3/10.   Baseline pt reporting 4-5/10 with doing dishes and standing for 20 minutes   Time 8   Period Weeks   Status On-going     PT LONG TERM GOAL #3   Title Perform ADL's with pain not > 3/10.   Baseline Pt reporting pain vaires with ADL's from 3-8/10.    Time 8   Period Weeks   Status On-going     PT LONG TERM GOAL #4   Title Pt to be walking 10 minutes twice a day to promote lymphatic circulation    Time 6   Period Weeks   Status On-going     PT LONG TERM GOAL #5   Title Pt to state she is able to walk for 10 minutes without difficulty ( pt limited to 10 minutes due to back issues)   Time 8   Period Weeks  Status On-going               Plan - 01/22/17 0955    Clinical Impression  Statement Patient continues to progress with gait training in clinic as well as standing activities. Fatigue reported by patient throughout treatment but especially following gait training. Gait training initially began with [facility] per patient's previous request but quickly asked to go back to [facility] secondary to feeling of instability. Lack of full step and stride length continues to be noted during gait training. R UT pain also experienced by patient during gait training secondary to dependence on RUE for AD. Normal modalities response noted following removal of the modalities.   Rehab Potential Good   PT Frequency 2x / week   PT Duration 8 weeks   PT Treatment/Interventions Iontophoresis 28m/ml Dexamethasone;Moist Heat;Patient/family education;Therapeutic exercise;Therapeutic activities;Manual techniques;Vasopneumatic Device;Dry needling;Cryotherapy;Gait training   PT Next Visit Plan Continue standing exercises with attemp at gait training with [facility].   PT Home Exercise Plan Instructed in TENS use, continue SLR, sit to stand, supine marching, LAQ   Consulted and Agree with Plan of Care Patient      Patient will benefit from skilled therapeutic intervention in order to improve the following deficits and impairments:  Decreased activity tolerance, Decreased strength, Pain, Postural dysfunction, Impaired perceived functional ability, Decreased balance, Decreased mobility  Visit Diagnosis: Difficulty in walking, not elsewhere classified  Chronic pain of left knee  Muscle weakness (generalized)  Difficulty walking     Problem List Patient Active Problem List   Diagnosis Date Noted  . Unstable angina ([place] 05/21/2016  . Chronic diastolic heart failure ([place] 03/29/2016  . Normal coronary arteries 2009 01/14/2015  . Obesity-BMI 40 01/14/2015  . Restless leg 01/14/2015  . Chest pain 01/14/2015  . Back pain 01/14/2015  . Renal insufficiency 01/14/2015  . Dementia- mild memory  issues 01/14/2015  . Occult blood positive stool 12/14/2014  . Anemia 12/14/2014  . Family history of colon cancer 12/14/2014  . Change in bowel habits 12/14/2014  . GERD (gastroesophageal reflux disease) 12/14/2014  . Dyspnea 05/17/2012  . Lymphedema 05/17/2012  . Weight gain 05/17/2012  . HTN (hypertension) 05/17/2012    KWynelle Fanny PTA 01/22/2017, 10:31 AM  [facility] [address]Phone: [phone]  Fax:  [phone] Name: CJERSEE WINIARSKIMRN: [id number] of Birth: [birth year]

## 2017-01-25 ENCOUNTER — Ambulatory Visit (INDEPENDENT_AMBULATORY_CARE_PROVIDER_SITE_OTHER): Payer: Medicare Other | Admitting: Orthopaedic Surgery

## 2017-01-25 DIAGNOSIS — T84093A Other mechanical complication of internal left knee prosthesis, initial encounter: Secondary | ICD-10-CM

## 2017-01-25 DIAGNOSIS — T84063A Wear of articular bearing surface of internal prosthetic left knee joint, initial encounter: Secondary | ICD-10-CM

## 2017-01-25 NOTE — Progress Notes (Signed)
Office Visit Note   Patient: [name]           Date of Birth: October 14, 1945           MRN: [id number] Visit Date: 01/25/2017              Requested by: Dione Housekeeper, MD [address] [place], [place] [address] PCP: Dione Housekeeper, MD   Assessment & Plan: Visit Diagnoses:  1. 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 Upon review of the previous operative note it was dictated by the operative surgeon that the polyliner is a 10 mm thickness liner. Given the time that is been in her knee of 12 years and the fact that she is significantly obese and is likely stretched ligaments of her knee I feel that a thicker polyliner would give her more knee stability. It does not appear the components themselves were loose. I talked to her in detail about what the surgery involves and shoulder and knee model to explain what the surgery involves. This was a knee replacement model. She certainly understands that she may end up with a full revision of all components but with her plain films and bone scan showing no evidence of prosthetic loosening I feel that upsizing her polyethylene liner is most appropriate thing to try. She has been through all other forms of conservative treatment including physical therapy. Her instability of that left knee is driving our recommendation. She understands this fully and all questions were encouraged and answered. She does have a history of heart failure and her cardiologist is Dr. Loralie Champagne who she said she sees next week. She is not on a blood thinner. We will work on getting her surgery scheduled and if there is any concerns from the cardiologist she will let us know.  Follow-Up Instructions: Return for 2 weeks post-op.   Orders:  No orders of the defined types were placed in this encounter.  No orders of the defined  types were placed in this encounter.     Procedures: No procedures performed   Clinical Data: No additional findings.   Subjective: No chief complaint on file. The patient is very pleasant 71 year old female who is referred to me from another orthopedic surgeon in town. I've also taking care of one her family members before. She has a history of bilateral knee replacements. They were done at separate times. The first one was done in 2006 which was the left knee. This was performed by a different orthopedic surgeon in the referral surgeon. She reports that her left knee is felt unstable to her and it feels as if it will not support her and give way. She has x-rays from a review today as well as a 3 phase bone scan of that knee. She has gone through physical therapy as well and attempted weight loss. She is morbidly obese. She ambulates with a rolling walker. Her instability that knee is detrimentally affected her activities daily living, her quality of life, and her mobility. She is seeking advice with further evaluation and treatment of that knee.  HPI  Review of Systems She currently denies any headache, chest pain, shortness of breath, fever, chills, nausea, vomiting  Objective: Vital Signs: There were no vitals taken for this visit.  Physical Exam She is alert and oriented 3 and in no acute distress  Ortho Exam Examination of both knees show well-healed surgical incisions and no significant warmth of either knee. There is no redness either. Both knees have good range of motion. Her left knee shows slightly more laxity in the drawer sign and with varus and valgus stressing compared to the right knee. Specialty Comments:  No specialty comments available.  Imaging: No results found. The 3 phase bone scan and the plain films of her left knee were independently reviewed by me. The x-rays show well-seated implant of the femoral and tibial components with no gross signs of loosening. The  3 phase bone scan also shows no evidence of loosening or other uptake to suggest infection.  PMFS History: Patient Active Problem List   Diagnosis Date Noted  . Unstable angina ([place]) 05/21/2016  . Chronic diastolic heart failure ([place]) 03/29/2016  . Normal coronary arteries 2009 01/14/2015  . Obesity-BMI 40 01/14/2015  . Restless leg 01/14/2015  . Chest pain 01/14/2015  . Back pain 01/14/2015  . Renal insufficiency 01/14/2015  . Dementia- mild memory issues 01/14/2015  . Occult blood positive stool 12/14/2014  . Anemia 12/14/2014  . Family history of colon cancer 12/14/2014  . Change in bowel habits 12/14/2014  . GERD (gastroesophageal reflux disease) 12/14/2014  . Dyspnea 05/17/2012  . Lymphedema 05/17/2012  . Weight gain 05/17/2012  . HTN (hypertension) 05/17/2012   Past Medical History:  Diagnosis Date  . Anemia   . Anxiety   . Arthritis   . Brain lesion    2 types  . Cervical spondylosis with myelopathy   . Chest pain    Normal cardiac cath 5/09  . Congestive heart failure (CHF) ([place])   . Depression   . Dumping syndrome   . Edema   . Fatigue   . Fibromyalgia   . History of echocardiogram    a. Echo 03/20/16 (done at [facility] in [place], [place]):  mild LVH, EF 96%, normal diastolic function, mild LAE, MAC, RVSP 25 mmHg  . Hyperlipidemia   . Hypertension   . Hypothyroidism   . Lumbar spondylosis   . Lymphedema    seeing specialist for this  . Restless leg syndrome   . Rotator cuff injury   . Stroke [facility])    TIAs "mini strokes"- unsure of last TIA  . Syncope   . Tremors of nervous system     Family History  Problem Relation Age of Onset  . Heart disease Mother   . Heart failure Mother   . Kidney disease Mother   . Lung cancer Father   . Colon cancer Brother   . Cancer Brother        malignant thymoma  . Heart disease Brother   . Tremor Maternal Grandmother 90  . Ovarian cancer Maternal Grandmother   . Uterine cancer Maternal Grandmother   .  Irritable bowel syndrome Daughter   . Esophageal cancer Neg Hx   . Stomach cancer Neg Hx   . Rectal cancer Neg Hx     Past Surgical History:  Procedure Laterality Date  . APPENDECTOMY  1966  . CARDIAC CATHETERIZATION N/A 05/25/2016   Procedure: Right/Left Heart Cath and Coronary Angiography;  Surgeon: Larey Dresser, MD;  Location: [place] [facility];  Service: Cardiovascular;  Laterality: N/A;  . CHOLECYSTECTOMY  2015  . CHOLECYSTECTOMY  2015  . CORONARY ANGIOGRAM  2009   Normal coronaries  . Hip Bursa Surgery Bilateral   . KNEE ARTHROSCOPY Bilateral   . LUMBAR DISC SURGERY  x2  . LUMBAR FUSION    . RIGHT HEART CATH N/A 11/13/2016   Procedure: Right Heart Cath with Cardiomems;  Surgeon: Larey Dresser, MD;  Location: [place] [facility];  Service: Cardiovascular;  Laterality: N/A;  . ROTATOR CUFF REPAIR Bilateral   . TOTAL ABDOMINAL HYSTERECTOMY    . TOTAL KNEE ARTHROPLASTY Left   . TOTAL KNEE ARTHROPLASTY Right 11/18/2012   Procedure: TOTAL KNEE ARTHROPLASTY;  Surgeon: Yvette Rack., MD;  Location: [place];  Service: Orthopedics;  Laterality: Right;   Social History   Occupational History  . retired    Social History Main Topics  . Smoking status: Never Smoker  . Smokeless tobacco: Never Used  . Alcohol use No  . Drug use: No  . Sexual activity: Not Currently

## 2017-01-26 ENCOUNTER — Ambulatory Visit: Payer: Medicare Other | Admitting: Physical Therapy

## 2017-01-26 ENCOUNTER — Encounter: Payer: Self-pay | Admitting: Physical Therapy

## 2017-01-26 DIAGNOSIS — M25562 Pain in left knee: Secondary | ICD-10-CM

## 2017-01-26 DIAGNOSIS — G8929 Other chronic pain: Secondary | ICD-10-CM

## 2017-01-26 DIAGNOSIS — R262 Difficulty in walking, not elsewhere classified: Secondary | ICD-10-CM

## 2017-01-26 DIAGNOSIS — M6281 Muscle weakness (generalized): Secondary | ICD-10-CM

## 2017-01-26 NOTE — Therapy (Signed)
[facility] [place], [place], [address] Phone: [phone]   Fax:  [phone]  Physical Therapy Treatment  Patient Details  Name: [name] MRN: [id number] Date of Birth: [birth year] Referring Provider: Earlie Server, MD  Encounter Date: 01/26/2017      PT End of Session - 01/26/17 1029    Visit Number 27   Number of Visits 30   Date for PT Re-Evaluation 01/20/17   PT Start Time 0900   PT Stop Time 0957   PT Time Calculation (min) 57 min   Activity Tolerance Patient tolerated treatment well   Behavior During Therapy [facility] for tasks assessed/performed      Past Medical History:  Diagnosis Date  . Anemia   . Anxiety   . Arthritis   . Brain lesion    2 types  . Cervical spondylosis with myelopathy   . Chest pain    Normal cardiac cath 5/09  . Congestive heart failure (CHF) ([place])   . Depression   . Dumping syndrome   . Edema   . Fatigue   . Fibromyalgia   . History of echocardiogram    a. Echo 03/20/16 (done at [facility] in [place], [place]):  mild LVH, EF 57%, normal diastolic function, mild LAE, MAC, RVSP 25 mmHg  . Hyperlipidemia   . Hypertension   . Hypothyroidism   . Lumbar spondylosis   . Lymphedema    seeing specialist for this  . Restless leg syndrome   . Rotator cuff injury   . Stroke [facility])    TIAs "mini strokes"- unsure of last TIA  . Syncope   . Tremors of nervous system     Past Surgical History:  Procedure Laterality Date  . APPENDECTOMY  1966  . CARDIAC CATHETERIZATION N/A 05/25/2016   Procedure: Right/Left Heart Cath and Coronary Angiography;  Surgeon: Larey Dresser, MD;  Location: [place] [facility];  Service: Cardiovascular;  Laterality: N/A;  . CHOLECYSTECTOMY  2015  . CHOLECYSTECTOMY  2015  . CORONARY ANGIOGRAM  2009   Normal coronaries  . Hip Bursa Surgery Bilateral   . KNEE ARTHROSCOPY Bilateral   . LUMBAR DISC SURGERY     x2  . LUMBAR FUSION    . RIGHT HEART CATH N/A  11/13/2016   Procedure: Right Heart Cath with Cardiomems;  Surgeon: Larey Dresser, MD;  Location: [place] [facility];  Service: Cardiovascular;  Laterality: N/A;  . ROTATOR CUFF REPAIR Bilateral   . TOTAL ABDOMINAL HYSTERECTOMY    . TOTAL KNEE ARTHROPLASTY Left   . TOTAL KNEE ARTHROPLASTY Right 11/18/2012   Procedure: TOTAL KNEE ARTHROPLASTY;  Surgeon: Yvette Rack., MD;  Location: [place];  Service: Orthopedics;  Laterality: Right;    There were no vitals filed for this visit.      Subjective Assessment - 01/26/17 0901    Subjective Reports that she saw the surgeon yesterday and he wants to redo cushion to replacement piece. Purchased a QC recently too.   Pertinent History Bilateral total knee replacements. Bilateral rotator cuff repairs   Limitations Walking   How long can you stand comfortably? Less than 5-10 minutes.   How long can you walk comfortably? < 5 min   Patient Stated Goals Get out of pain, walk better.    Currently in Pain? Other (Comment)  No pain assessment provided by patient            [facility] PT Assessment - 01/26/17 0001  Assessment   Medical Diagnosis left knee pain   Next MD Visit To be scheduled     Restrictions   Weight Bearing Restrictions No                     [facility] Adult PT Treatment/Exercise - 01/26/17 0001      Ambulation/Gait   Ambulation/Gait Yes   Ambulation/Gait Assistance 5: Supervision   Ambulation/Gait Assistance Details VCs for erect stance, safety on uneven surface and AD assessment   Ambulation Distance (Feet) 518 Feet   Assistive device Small based quad cane   Gait Pattern Step-to pattern;Decreased arm swing - left;Decreased step length - left;Decreased stance time - left;Decreased stride length;Decreased weight shift to left;Left foot flat;Antalgic;Lateral trunk lean to right;Narrow base of support   Ambulation Surface Level;Indoor;Unlevel;Outdoor;Paved     Knee/Hip Exercises: Aerobic   Nustep L7 x10 min, seat  10     Modalities   Modalities Electrical Stimulation;Cryotherapy     Cryotherapy   Number Minutes Cryotherapy 15 Minutes   Cryotherapy Location Knee   Type of Cryotherapy Ice pack     Electrical Stimulation   Electrical Stimulation Location L knee   Electrical Stimulation Action Pre-Mod   Electrical Stimulation Parameters 80-150 hz x15 min   Electrical Stimulation Goals Pain                  PT Short Term Goals - 10/05/16 1037      PT SHORT TERM GOAL #1   Title STG's=LTG's.           PT Long Term Goals - 12/07/16 1033      PT LONG TERM GOAL #1   Title Independent with a HEP.   Time 8   Period Weeks   Status Partially Met     PT LONG TERM GOAL #2   Title Stand 20 minutes with left knee pain not > 3/10.   Baseline pt reporting 4-5/10 with doing dishes and standing for 20 minutes   Time 8   Period Weeks   Status On-going     PT LONG TERM GOAL #3   Title Perform ADL's with pain not > 3/10.   Baseline Pt reporting pain vaires with ADL's from 3-8/10.    Time 8   Period Weeks   Status On-going     PT LONG TERM GOAL #4   Title Pt to be walking 10 minutes twice a day to promote lymphatic circulation    Time 6   Period Weeks   Status On-going     PT LONG TERM GOAL #5   Title Pt to state she is able to walk for 10 minutes without difficulty ( pt limited to 10 minutes due to back issues)   Time 8   Period Weeks   Status On-going               Plan - 01/26/17 1023    Clinical Impression Statement Patient arrived to treatment with encouragement regarding visit to surgeon yesterday and MD's report. Patient requested to try walking outside on different surfaces. Patient utilized her new SBQC in clinic with height higher than wrist crease secondary to reduced R shoulder discomfort. AD height adjusted lower by one place but patient liked the higher cane as she preferred it to reduce shoulder pain. Increased step/stride length attempted by patient with  more erect stance. As fatigue sets in patient's stride and step length reduced. Patient's gait with cane has greatly improved and  patient encouraged that surgeon said following surgery she would be even better. Normal modalities response noted following removal of the modalities.   Rehab Potential Good   PT Frequency 2x / week   PT Duration 8 weeks   PT Treatment/Interventions Iontophoresis 23m/ml Dexamethasone;Moist Heat;Patient/family education;Therapeutic exercise;Therapeutic activities;Manual techniques;Vasopneumatic Device;Dry needling;Cryotherapy;Gait training   PT Next Visit Plan Continue standing exercises with attemp at gait [facility].   PT Home Exercise Plan Instructed in TENS use, continue SLR, sit to stand, supine marching, LAQ   Consulted and Agree with Plan of Care Patient      Patient will benefit from skilled therapeutic intervention in order to improve the following deficits and impairments:  Decreased activity tolerance, Decreased strength, Pain, Postural dysfunction, Impaired perceived functional ability, Decreased balance, Decreased mobility  Visit Diagnosis: Difficulty in walking, not elsewhere classified  Chronic pain of left knee  Muscle weakness (generalized)     Problem List Patient Active Problem List   Diagnosis Date Noted  . Unstable angina ([place] 05/21/2016  . Chronic diastolic heart failure ([place] 03/29/2016  . Normal coronary arteries 2009 01/14/2015  . Obesity-BMI 40 01/14/2015  . Restless leg 01/14/2015  . Chest pain 01/14/2015  . Back pain 01/14/2015  . Renal insufficiency 01/14/2015  . Dementia- mild memory issues 01/14/2015  . Occult blood positive stool 12/14/2014  . Anemia 12/14/2014  . Family history of colon cancer 12/14/2014  . Change in bowel habits 12/14/2014  . GERD (gastroesophageal reflux disease) 12/14/2014  . Dyspnea 05/17/2012  . Lymphedema 05/17/2012  . Weight gain 05/17/2012  . HTN (hypertension)  05/17/2012    KWynelle Fanny PTA 01/26/2017, 10:31 AM  [facility] [place] [address]Phone: [phone]  Fax:  [phone] Name: [name] MCKIERNANMRN: [id number] of Birth: 6Jan 06, 1947

## 2017-01-28 ENCOUNTER — Encounter: Payer: Self-pay | Admitting: Physical Therapy

## 2017-01-28 ENCOUNTER — Ambulatory Visit (HOSPITAL_COMMUNITY)
Admission: RE | Admit: 2017-01-28 | Discharge: 2017-01-28 | Disposition: A | Discharge status: Home / Self Care | Payer: Medicare Other | Source: Ambulatory Visit | Attending: Internal Medicine | Admitting: Internal Medicine

## 2017-01-28 ENCOUNTER — Ambulatory Visit: Payer: Medicare Other | Admitting: Physical Therapy

## 2017-01-28 DIAGNOSIS — R262 Difficulty in walking, not elsewhere classified: Secondary | ICD-10-CM

## 2017-01-28 DIAGNOSIS — M25562 Pain in left knee: Secondary | ICD-10-CM

## 2017-01-28 DIAGNOSIS — M6281 Muscle weakness (generalized): Secondary | ICD-10-CM

## 2017-01-28 DIAGNOSIS — G8929 Other chronic pain: Secondary | ICD-10-CM

## 2017-01-28 NOTE — Progress Notes (Signed)
   Cardiomems Update   Implant Date 11/13/2016   RHC RA 4 PA 23/10 (15)  PCWP 5 PA 76% CO 9.1 CI 4.1      Implant Labs Creatinine 2.12 BUN 58 Potassium 3.9   PAD Range 10-17 Current PAD  9   Current Labs 01/10/2017 Creatinine 2.36 BUN 66 Potassium 4.0  Recommendations   I have reviewed current PAD from cardiomems device. PAD reading stable. .  Continue current diuretic regimen.   Follow monthly     Amy Clegg NP-C  4:02 PM

## 2017-01-28 NOTE — Therapy (Signed)
[facility] [place], [place], [address] Phone: [phone]   Fax:  [phone]  Physical Therapy Treatment  Patient Details  Name: [name] MRN: [id number] Date of Birth: 08-10-1946 Referring Provider: Earlie Server, MD  Encounter Date: 01/28/2017      PT End of Session - 01/28/17 0913    Visit Number 28   Number of Visits 30   Date for PT Re-Evaluation 01/20/17   PT Start Time 0902   PT Stop Time 0954   PT Time Calculation (min) 52 min   Activity Tolerance Patient tolerated treatment well   Behavior During Therapy [facility] for tasks assessed/performed      Past Medical History:  Diagnosis Date  . Anemia   . Anxiety   . Arthritis   . Brain lesion    2 types  . Cervical spondylosis with myelopathy   . Chest pain    Normal cardiac cath 5/09  . Congestive heart failure (CHF) ([place])   . Depression   . Dumping syndrome   . Edema   . Fatigue   . Fibromyalgia   . History of echocardiogram    a. Echo 03/20/16 (done at [facility] in [place], [place]):  mild LVH, EF 37%, normal diastolic function, mild LAE, MAC, RVSP 25 mmHg  . Hyperlipidemia   . Hypertension   . Hypothyroidism   . Lumbar spondylosis   . Lymphedema    seeing specialist for this  . Restless leg syndrome   . Rotator cuff injury   . Stroke [facility])    TIAs "mini strokes"- unsure of last TIA  . Syncope   . Tremors of nervous system     Past Surgical History:  Procedure Laterality Date  . APPENDECTOMY  1966  . CARDIAC CATHETERIZATION N/A 05/25/2016   Procedure: Right/Left Heart Cath and Coronary Angiography;  Surgeon: Larey Dresser, MD;  Location: [place] [facility];  Service: Cardiovascular;  Laterality: N/A;  . CHOLECYSTECTOMY  2015  . CHOLECYSTECTOMY  2015  . CORONARY ANGIOGRAM  2009   Normal coronaries  . Hip Bursa Surgery Bilateral   . KNEE ARTHROSCOPY Bilateral   . LUMBAR DISC SURGERY     x2  . LUMBAR FUSION    . RIGHT HEART CATH N/A  11/13/2016   Procedure: Right Heart Cath with Cardiomems;  Surgeon: Larey Dresser, MD;  Location: [place] [facility];  Service: Cardiovascular;  Laterality: N/A;  . ROTATOR CUFF REPAIR Bilateral   . TOTAL ABDOMINAL HYSTERECTOMY    . TOTAL KNEE ARTHROPLASTY Left   . TOTAL KNEE ARTHROPLASTY Right 11/18/2012   Procedure: TOTAL KNEE ARTHROPLASTY;  Surgeon: Yvette Rack., MD;  Location: [place];  Service: Orthopedics;  Laterality: Right;    There were no vitals filed for this visit.      Subjective Assessment - 01/28/17 0911    Subjective Reports that she has not heard from surgeon yet. Reports that she got her some lighter shoes.   Pertinent History Bilateral total knee replacements. Bilateral rotator cuff repairs   Limitations Walking   How long can you stand comfortably? Less than 5-10 minutes.   How long can you walk comfortably? < 5 min   Patient Stated Goals Get out of pain, walk better.    Currently in Pain? Other (Comment)  No pain assessment provided            [facility] PT Assessment - 01/28/17 0001      Assessment  Medical Diagnosis left knee pain   Next MD Visit To be scheduled                     [facility] Adult PT Treatment/Exercise - 01/28/17 0001      Ambulation/Gait   Ambulation/Gait Yes   Ambulation/Gait Assistance 5: Supervision   Ambulation/Gait Assistance Details VCs for looking ahead   Ambulation Distance (Feet) 514 Feet   Assistive device Small based quad cane   Gait Pattern Step-to pattern;Decreased arm swing - left;Decreased step length - left;Decreased stance time - left;Decreased stride length;Decreased weight shift to left;Left foot flat;Antalgic;Lateral trunk lean to right;Narrow base of support   Ambulation Surface Level;Indoor;Unlevel;Paved;Outdoor   Stairs Yes   Stairs Assistance 6: Modified independent (Device/Increase time)   Stair Management Technique One rail Left;Step to pattern;Forwards;With cane   Number of Stairs 2   Height of  Stairs 7   Ramp 6: Modified independent (Device)     Knee/Hip Exercises: Aerobic   Nustep L7 x12 min, seat 10     Modalities   Modalities Electrical Stimulation;Cryotherapy     Cryotherapy   Number Minutes Cryotherapy 15 Minutes   Cryotherapy Location Knee   Type of Cryotherapy Ice pack     Electrical Stimulation   Electrical Stimulation Location L knee   Electrical Stimulation Action Pre-mod   Electrical Stimulation Parameters 80-150 hz x15 min   Electrical Stimulation Goals Pain                  PT Short Term Goals - 10/05/16 1037      PT SHORT TERM GOAL #1   Title STG's=LTG's.           PT Long Term Goals - 12/07/16 1033      PT LONG TERM GOAL #1   Title Independent with a HEP.   Time 8   Period Weeks   Status Partially Met     PT LONG TERM GOAL #2   Title Stand 20 minutes with left knee pain not > 3/10.   Baseline pt reporting 4-5/10 with doing dishes and standing for 20 minutes   Time 8   Period Weeks   Status On-going     PT LONG TERM GOAL #3   Title Perform ADL's with pain not > 3/10.   Baseline Pt reporting pain vaires with ADL's from 3-8/10.    Time 8   Period Weeks   Status On-going     PT LONG TERM GOAL #4   Title Pt to be walking 10 minutes twice a day to promote lymphatic circulation    Time 6   Period Weeks   Status On-going     PT LONG TERM GOAL #5   Title Pt to state she is able to walk for 10 minutes without difficulty ( pt limited to 10 minutes due to back issues)   Time 8   Period Weeks   Status On-going               Plan - 01/28/17 0954    Clinical Impression Statement Patient arrived to treatment without use of rollator instead using rollator and with lighter weight shoes. Patient ambulated with improved stability in uneven surfaces with lighter weight shoes. Short step and stride lengths noted and patient provided VCs for looking at surroundings. Patient required short rest breaks during ambulation and began  experiencing low back pain with ambulation. Close supervision provided for stair and ramp gait training with weakness still  noted with forward step ups today. Normal modalities response noted following removal of the modalities.   Rehab Potential Good   PT Frequency 2x / week   PT Duration 8 weeks   PT Treatment/Interventions Iontophoresis 69m/ml Dexamethasone;Moist Heat;Patient/family education;Therapeutic exercise;Therapeutic activities;Manual techniques;Vasopneumatic Device;Dry needling;Cryotherapy;Gait training   PT Next Visit Plan Continue standing exercises with attemp at gait [facility].   PT Home Exercise Plan Instructed in TENS use, continue SLR, sit to stand, supine marching, LAQ   Consulted and Agree with Plan of Care Patient      Patient will benefit from skilled therapeutic intervention in order to improve the following deficits and impairments:  Decreased activity tolerance, Decreased strength, Pain, Postural dysfunction, Impaired perceived functional ability, Decreased balance, Decreased mobility  Visit Diagnosis: Difficulty in walking, not elsewhere classified  Chronic pain of left knee  Muscle weakness (generalized)     Problem List Patient Active Problem List   Diagnosis Date Noted  . Unstable angina ([place] 05/21/2016  . Chronic diastolic heart failure ([place] 03/29/2016  . Normal coronary arteries 2009 01/14/2015  . Obesity-BMI 40 01/14/2015  . Restless leg 01/14/2015  . Chest pain 01/14/2015  . Back pain 01/14/2015  . Renal insufficiency 01/14/2015  . Dementia- mild memory issues 01/14/2015  . Occult blood positive stool 12/14/2014  . Anemia 12/14/2014  . Family history of colon cancer 12/14/2014  . Change in bowel habits 12/14/2014  . GERD (gastroesophageal reflux disease) 12/14/2014  . Dyspnea 05/17/2012  . Lymphedema 05/17/2012  . Weight gain 05/17/2012  . HTN (hypertension) 05/17/2012    KWynelle Fanny PTA 01/28/2017, 10:15  AM  [facility] [address]Phone: [phone]  Fax:  [phone] Name: CLAVAYAH VITAMRN: [id number] of Birth: [birth year]

## 2017-01-29 NOTE — Addendum Note (Signed)
Addended by: Burgandy Hackworth, [place] on: 01/29/2017 01:47 PM   Modules accepted: Orders

## 2017-02-01 ENCOUNTER — Ambulatory Visit (HOSPITAL_COMMUNITY)
Admission: RE | Admit: 2017-02-01 | Discharge: 2017-02-01 | Disposition: A | Discharge status: Home / Self Care | Payer: Medicare Other | Source: Ambulatory Visit | Attending: Internal Medicine | Admitting: Internal Medicine

## 2017-02-01 VITALS — BP 148/78 | HR 96 | Wt 257.8 lb

## 2017-02-01 DIAGNOSIS — N183 Chronic kidney disease, stage 3 (moderate): Secondary | ICD-10-CM | POA: Insufficient documentation

## 2017-02-01 DIAGNOSIS — M549 Dorsalgia, unspecified: Secondary | ICD-10-CM | POA: Diagnosis not present

## 2017-02-01 DIAGNOSIS — N179 Acute kidney failure, unspecified: Secondary | ICD-10-CM | POA: Diagnosis not present

## 2017-02-01 DIAGNOSIS — I10 Essential (primary) hypertension: Secondary | ICD-10-CM

## 2017-02-01 DIAGNOSIS — G8929 Other chronic pain: Secondary | ICD-10-CM | POA: Insufficient documentation

## 2017-02-01 DIAGNOSIS — I130 Hypertensive heart and chronic kidney disease with heart failure and stage 1 through stage 4 chronic kidney disease, or unspecified chronic kidney disease: Secondary | ICD-10-CM | POA: Insufficient documentation

## 2017-02-01 DIAGNOSIS — N289 Disorder of kidney and ureter, unspecified: Secondary | ICD-10-CM | POA: Diagnosis not present

## 2017-02-01 DIAGNOSIS — I5032 Chronic diastolic (congestive) heart failure: Secondary | ICD-10-CM | POA: Diagnosis not present

## 2017-02-01 DIAGNOSIS — E108 Type 1 diabetes mellitus with unspecified complications: Secondary | ICD-10-CM | POA: Diagnosis not present

## 2017-02-01 DIAGNOSIS — F0390 Unspecified dementia without behavioral disturbance: Secondary | ICD-10-CM | POA: Insufficient documentation

## 2017-02-01 DIAGNOSIS — R079 Chest pain, unspecified: Secondary | ICD-10-CM

## 2017-02-01 DIAGNOSIS — E1122 Type 2 diabetes mellitus with diabetic chronic kidney disease: Secondary | ICD-10-CM | POA: Diagnosis not present

## 2017-02-01 DIAGNOSIS — Z7984 Long term (current) use of oral hypoglycemic drugs: Secondary | ICD-10-CM | POA: Insufficient documentation

## 2017-02-01 DIAGNOSIS — Z7982 Long term (current) use of aspirin: Secondary | ICD-10-CM | POA: Insufficient documentation

## 2017-02-01 DIAGNOSIS — I890 Lymphedema, not elsewhere classified: Secondary | ICD-10-CM | POA: Diagnosis not present

## 2017-02-01 DIAGNOSIS — M797 Fibromyalgia: Secondary | ICD-10-CM | POA: Diagnosis not present

## 2017-02-01 LAB — BASIC METABOLIC PANEL
ANION GAP: 14 (ref 5–15)
BUN: 57 mg/dL — ABNORMAL HIGH (ref 6–20)
CALCIUM: 10 mg/dL (ref 8.9–10.3)
CO2: 28 mmol/L (ref 22–32)
Chloride: 92 mmol/L — ABNORMAL LOW (ref 101–111)
Creatinine, Ser: 2.36 mg/dL — ABNORMAL HIGH (ref 0.44–1.00)
GFR, EST AFRICAN AMERICAN: 23 mL/min — AB (ref >60)
GFR, EST NON AFRICAN AMERICAN: 19 mL/min — AB (ref >60)
GLUCOSE: 97 mg/dL (ref 65–99)
POTASSIUM: 4.3 mmol/L (ref 3.5–5.1)
Sodium: 134 mmol/L — ABNORMAL LOW (ref 135–145)

## 2017-02-01 NOTE — Progress Notes (Signed)
Advanced Heart Failure Medication Review by a Pharmacist  Does the patient  feel that his/her medications are working for him/her?  yes  Has the patient been experiencing any side effects to the medications prescribed?  no  Does the patient measure his/her own blood pressure or blood glucose at home?  yes   Does the patient have any problems obtaining medications due to transportation or finances?   no  Understanding of regimen: good Understanding of indications: good Potential of compliance: good Patient understands to avoid NSAIDs. Patient understands to avoid decongestants.  Issues to address at subsequent visits: none  Pharmacist comments: [name] is a pleasant 71 y/o F who presents with her daughter and medication list. Patient reports good adherence to her medications. She did not have any medication-related questions or concerns at this time.   [name] PharmD Candidate  Time with patient: 10 minutes  Preparation and documentation time: 15 minutes Total time: 25 minutes

## 2017-02-01 NOTE — Progress Notes (Signed)
Advanced Heart Failure Clinic Note   Primary Care: Dr. Edrick Oh Renal: Dr Joelyn Oms HF Cardiology: Dr. Aundra Dubin  HPI: [name] is a 71 y.o. with history of chronic diastolic CHF (Echo 01/29/36 LVEF 65%, Mild LAE), chronic lymphedema, chronic back pain, HTN, CKD stage 3, and fibromyalgia.    Admitted several times in 02/2016 to [facility] with CHF and chest pain.  Has been tried on spironolactone + metolazone alone as diuretic regimen. Follows with renal as above.   Seen in [facility] clinic 04/16/16. Thought to have mild volume overload. Lasix dose increased to 40 mg daily.  She was admitted in 9/17 with chest pain and AKI.  She had seen nephrology and was given metolazone to take daily along with Lasix, creatinine rose considerably.  Lasix was decreased to 40 mg bid.  When creatinine improved, she had right and left heart cath showing nonobstructive CAD and near-normal filling pressures.  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    Had Cardiomems placed on 11/13/16. PA diastolic 62mmHg at implant time, up to 17 mm Hg earlier this week.   PADP 13 today on Cardiomems. Which is her goal per Dr. Aundra Dubin.  She presents today for regular follow up. Feeling good overall.  Having urgency but not able to urinate. Is more clear today, but had been cloudy over the weekend. Sometimes she has a very strong urge to urinate, but cannot. Denies lightheadedness or dizziness. Did have some chest heaviness over the weekend, and weight up to 257 up from 253 on Friday.  Mild SOB with walking around the house. Has been trying to walk with a cane. Taking all medications as directed. Trying to limit fluid intake but very thirsty.  Recently diagnosed with Diabetes. Due for Hgb A1c.                                                                                                                                       Labs (9/17): K 4.6, creatinine 1.55, BNP 63 Labs (10/17): K 4.7, creatinine  1.8 Labs ( 09/06/2016): K 3.7 Creatinine 2.05  Labs (1/18): K 5.1, creatinine 2.03 Labs (2/18): hgb 12, K 4.3, creatinine 1.87 Labs (11/13/2016) K 3.8 Creatinine 2.12   Review of systems complete and found to be negative unless listed in HPI.    Past Medical History 1. Chronic diastolic CHF - Echo 08/29/24 LVEF 65%, Mild LAE - RHC (10/17): mean RA 4, PA 25/11, mean PCWP 5, CI 2.4.  - Echo (2/18): EF 55-60%, mild LVH, normal RV size and systolic function. - Cardiomems implanted 3/18.  2. CKD stage 3: Follows with nephology. Baseline appears to be creatinine 1.4-1.6.  3. Chronic lymphedema: Has had wraps.  4. HTN 5. Dementia 6. Fibromyalgia 7. Microvascular angina:  - Normal coronaries on Cath 2009 - Stress Myoview 04/09/16 with no ischemia - LHC (10/17) with mild nonobstructive disease.  FH: Mother with CHF, brother with "heart disease."  Another brother with h/o MI.   Current Outpatient Prescriptions  Medication Sig Dispense Refill  . alendronate (FOSAMAX) 70 MG tablet Take 70 mg by mouth every Friday. Take with a full glass of water on an empty stomach.     Marland Kitchen amLODipine (NORVASC) 10 MG tablet Take 1 tablet (10 mg total) by mouth daily. 30 tablet 6  . aspirin 81 MG tablet Take 81 mg by mouth at bedtime.    . baclofen (LIORESAL) 10 MG tablet Take 10 mg by mouth 3 (three) times daily.    . clopidogrel (PLAVIX) 75 MG tablet TAKE 1 TABLET DAILY 30 tablet 6  . donepezil (ARICEPT) 10 MG tablet Take 10 mg by mouth at bedtime.     . DULoxetine (CYMBALTA) 20 MG capsule Take 1 capsule by mouth daily.    Marland Kitchen escitalopram (LEXAPRO) 20 MG tablet Take 30 mg by mouth daily with supper.     . fentaNYL (DURAGESIC - DOSED MCG/HR) 25 MCG/HR patch Place 25 mcg onto the skin every 3 (three) days.     . fluticasone (FLONASE) 50 MCG/ACT nasal spray Place 1 spray into the nose daily.    . folic acid (FOLVITE) 1 MG tablet Take 1 mg by mouth 2 (two) times daily.    Marland Kitchen gabapentin (NEURONTIN) 100 MG capsule  Take 100 mg by mouth at bedtime.    Marland Kitchen glipiZIDE (GLUCOTROL XL) 2.5 MG 24 hr tablet Take 2.5 mg by mouth daily.    Marland Kitchen HYDROcodone-acetaminophen (NORCO) 7.5-325 MG tablet Take 1 tablet by mouth every 6 (six) hours as needed for moderate pain.    Marland Kitchen lamoTRIgine (LAMICTAL) 100 MG tablet Take 100 mg by mouth 2 (two) times daily.     Marland Kitchen levothyroxine (SYNTHROID, LEVOTHROID) 75 MCG tablet Take 75 mcg by mouth daily before breakfast.    . memantine (NAMENDA) 10 MG tablet Take 10 mg by mouth 2 (two) times daily.    . metolazone (ZAROXOLYN) 2.5 MG tablet Take 1 tablet (2.5 mg total) by mouth once a week. 15 tablet 3  . pravastatin (PRAVACHOL) 80 MG tablet Take 80 mg by mouth at bedtime.     . ranitidine (ZANTAC) 150 MG tablet Take 150 mg by mouth 2 (two) times daily.    Marland Kitchen rOPINIRole (REQUIP) 1 MG tablet Take 1 mg by mouth 3 (three) times daily.    . sennosides-docusate sodium (SENOKOT-S) 8.6-50 MG tablet Take 2 tablets by mouth at bedtime.    Marland Kitchen spironolactone (ALDACTONE) 25 MG tablet Take 25 mg by mouth 2 (two) times daily.    Marland Kitchen torsemide (DEMADEX) 20 MG tablet Take 4 tablets (80 mg total) by mouth daily. 90 tablet 3  . Turmeric 500 MG CAPS Take 1 capsule by mouth daily.     No current facility-administered medications for this encounter.     Allergies  Allergen Reactions  . Hydromorphone Other (See Comments)    Hallucinations, altered mental state      Social History   Social History  . Marital status: Widowed    Spouse name: N/A  . Number of children: 3  . Years of education: N/A   Occupational History  . retired    Social History Main Topics  . Smoking status: Never Smoker  . Smokeless tobacco: Never Used  . Alcohol use No  . Drug use: No  . Sexual activity: Not Currently   Other Topics Concern  . Not on file  Social History Narrative   Pt does use caffeine. Lives alone. 3 children. Retired.    Vitals:   02/01/17 1108  BP: (!) 148/78  Pulse: 96  SpO2: 98%  Weight: 257 lb  12.8 oz (116.9 kg)   Wt Readings from Last 3 Encounters:  02/01/17 257 lb 12.8 oz (116.9 kg)  12/02/16 256 lb 4 oz (116.2 kg)  11/18/16 260 lb 6.4 oz (118.1 kg)    PHYSICAL EXAM: General: Elderly, ambulated into clinic with walker.  HEENT: Normal Neck: supple. JVP difficult but does not appear elevated. Carotids 2+ bilat; no bruits. No thyromegaly or nodule noted. Cor: PMI nondisplaced. RRR, No M/G/R noted Lungs: CTAB, normal effort. Abdomen: soft, non-tender, distended, no HSM. No bruits or masses. +BS  Extremities: no cyanosis, clubbing, or rash. Bilateral lymphedema. No pitting edema.  Neuro: alert & orientedx3, cranial nerves grossly intact. moves all 4 extremities w/o difficulty. Affect pleasant   ASSESSMENT & PLAN: 1. Chronic diastolic CHF:  Echo (4/07) with EF 55-60%, normal RV.  Has Cardiomems. PADP stable at 13 (goal per Dr. Aundra Dubin.  - NYHA III symptoms.  - Volume status looks stable on exam. Continue torsmide 80 mg daily.  - Continue metolazone once a week.  - Reinforced fluid restriction to < 2 L daily, sodium restriction to less than 2000 mg daily, and the importance of daily weights.   2. Lymphedema: - Encouraged her to pick up her compression hose.  Continue lymphedema pumps.  3. CKD: Stage 3 -  BMET today.  4. Chest pain: LHC 2017 Nonobstructive.       - Mild heaviness over the weekend. Seems to correlate with her volume. Stable.  5. HTN - Mildly elevated in clinic today, but usually stable.  No changes today.  6. Arthritis s/p Left Knee replacement - Now with "polythylene wear" and knee instability. Dr. Ninfa Linden plans to replace her polyliner under local and regional anesthetic.  - EF normal, so OK from HF standpoint.  Risk of volume overload from fluid resuscitation after surgery, so suggest she have the surgery done at [facility] so the HF team can check on her. - Will also review with Dr. Aundra Dubin  7. DM2 - We will check her Hgb A1c per pt request as we need  labwork today.  - Per PCP.   Shirley Friar, PA-C  02/01/2017

## 2017-02-01 NOTE — Patient Instructions (Signed)
Routine lab work today. Will notify you of abnormal results, otherwise no news is good news!  No changes to medication at this time.  Follow up 2 months with Dr. McLean.  Take all medication as prescribed the day of your appointment. Bring all medications with you to your appointment.  Do the following things EVERYDAY: 1) Weigh yourself in the morning before breakfast. Write it down and keep it in a log. 2) Take your medicines as prescribed 3) Eat low salt foods-Limit salt (sodium) to 2000 mg per day.  4) Stay as active as you can everyday 5) Limit all fluids for the day to less than 2 liters  

## 2017-02-02 ENCOUNTER — Ambulatory Visit: Payer: Medicare Other | Admitting: Physical Therapy

## 2017-02-02 ENCOUNTER — Encounter: Payer: Self-pay | Admitting: Physical Therapy

## 2017-02-02 DIAGNOSIS — G8929 Other chronic pain: Secondary | ICD-10-CM

## 2017-02-02 DIAGNOSIS — R262 Difficulty in walking, not elsewhere classified: Secondary | ICD-10-CM | POA: Diagnosis not present

## 2017-02-02 DIAGNOSIS — M6281 Muscle weakness (generalized): Secondary | ICD-10-CM

## 2017-02-02 DIAGNOSIS — M25562 Pain in left knee: Secondary | ICD-10-CM

## 2017-02-02 LAB — HEMOGLOBIN A1C
HEMOGLOBIN A1C: 5.6 % (ref 4.8–5.6)
MEAN PLASMA GLUCOSE: 114 mg/dL

## 2017-02-02 NOTE — Therapy (Signed)
[facility] [place], [place], [address] Phone: [phone]   Fax:  [phone]  Physical Therapy Treatment  Patient Details  Name: [name] MRN: [id number] Date of Birth: 02-12-1946 Referring Provider: Earlie Server, MD  Encounter Date: 02/02/2017      PT End of Session - 02/02/17 0823    Visit Number 29   Number of Visits 36   Date for PT Re-Evaluation 02/17/17   PT Start Time 0822   PT Stop Time 0915   PT Time Calculation (min) 53 min   Activity Tolerance Patient limited by pain;Patient limited by fatigue   Behavior During Therapy [facility] for tasks assessed/performed      Past Medical History:  Diagnosis Date  . Anemia   . Anxiety   . Arthritis   . Brain lesion    2 types  . Cervical spondylosis with myelopathy   . Chest pain    Normal cardiac cath 5/09  . Congestive heart failure (CHF) ([place])   . Depression   . Dumping syndrome   . Edema   . Fatigue   . Fibromyalgia   . History of echocardiogram    a. Echo 03/20/16 (done at [facility] in [place], [place]):  mild LVH, EF 41%, normal diastolic function, mild LAE, MAC, RVSP 25 mmHg  . Hyperlipidemia   . Hypertension   . Hypothyroidism   . Lumbar spondylosis   . Lymphedema    seeing specialist for this  . Restless leg syndrome   . Rotator cuff injury   . Stroke [facility])    TIAs "mini strokes"- unsure of last TIA  . Syncope   . Tremors of nervous system     Past Surgical History:  Procedure Laterality Date  . APPENDECTOMY  1966  . CARDIAC CATHETERIZATION N/A 05/25/2016   Procedure: Right/Left Heart Cath and Coronary Angiography;  Surgeon: Larey Dresser, MD;  Location: [place] [facility];  Service: Cardiovascular;  Laterality: N/A;  . CHOLECYSTECTOMY  2015  . CHOLECYSTECTOMY  2015  . CORONARY ANGIOGRAM  2009   Normal coronaries  . Hip Bursa Surgery Bilateral   . KNEE ARTHROSCOPY Bilateral   . LUMBAR DISC SURGERY     x2  . LUMBAR FUSION    .  RIGHT HEART CATH N/A 11/13/2016   Procedure: Right Heart Cath with Cardiomems;  Surgeon: Larey Dresser, MD;  Location: [place] [facility];  Service: Cardiovascular;  Laterality: N/A;  . ROTATOR CUFF REPAIR Bilateral   . TOTAL ABDOMINAL HYSTERECTOMY    . TOTAL KNEE ARTHROPLASTY Left   . TOTAL KNEE ARTHROPLASTY Right 11/18/2012   Procedure: TOTAL KNEE ARTHROPLASTY;  Surgeon: Yvette Rack., MD;  Location: [place];  Service: Orthopedics;  Laterality: Right;    There were no vitals filed for this visit.      Subjective Assessment - 02/02/17 0822    Subjective Reports she had cramps all night long today and doesn't want to strengthen muscles as to avoid flaring muscles. Reports that her LBP almost had her in tears this morning while sititng and brushing her teeth.   Pertinent History Bilateral total knee replacements. Bilateral rotator cuff repairs   Limitations Walking   How long can you stand comfortably? Less than 5-10 minutes.   How long can you walk comfortably? < 5 min   Patient Stated Goals Get out of pain, walk better.    Currently in Pain? Yes   Pain Score 4  Pain Location Leg   Pain Orientation Left;Right   Pain Descriptors / Indicators Sore   Pain Type Chronic pain   Pain Onset More than a month ago            [facility] PT Assessment - 02/02/17 0001      Assessment   Medical Diagnosis left knee pain   Next MD Visit To be scheduled     Restrictions   Weight Bearing Restrictions No                     [facility] Adult PT Treatment/Exercise - 02/02/17 0001      Ambulation/Gait   Ambulation/Gait Yes   Ambulation/Gait Assistance 5: Supervision   Ambulation Distance (Feet) 390 Feet   Assistive device None   Gait Pattern Step-through pattern;Decreased arm swing - right;Decreased arm swing - left;Decreased step length - left;Decreased stance time - left;Decreased stride length;Decreased weight shift to left;Antalgic;Lateral trunk lean to right;Narrow base of  support   Ambulation Surface Level;Indoor     Knee/Hip Exercises: Aerobic   Nustep L4 x15 min, 900 steps     Knee/Hip Exercises: Standing   Hip Flexion AROM;Both;3 sets;10 reps;Knee bent   Other Standing Knee Exercises Sidestep with no resistance x6 RT in // bars     Modalities   Modalities Electrical Stimulation;Cryotherapy     Cryotherapy   Number Minutes Cryotherapy 15 Minutes   Cryotherapy Location Knee   Type of Cryotherapy Ice pack     Electrical Stimulation   Electrical Stimulation Location L knee   Electrical Stimulation Action Pre-Mod   Electrical Stimulation Parameters 80-150 hz x15 min   Electrical Stimulation Goals Pain                  PT Short Term Goals - 10/05/16 1037      PT SHORT TERM GOAL #1   Title STG's=LTG's.           PT Long Term Goals - 12/07/16 1033      PT LONG TERM GOAL #1   Title Independent with a HEP.   Time 8   Period Weeks   Status Partially Met     PT LONG TERM GOAL #2   Title Stand 20 minutes with left knee pain not > 3/10.   Baseline pt reporting 4-5/10 with doing dishes and standing for 20 minutes   Time 8   Period Weeks   Status On-going     PT LONG TERM GOAL #3   Title Perform ADL's with pain not > 3/10.   Baseline Pt reporting pain vaires with ADL's from 3-8/10.    Time 8   Period Weeks   Status On-going     PT LONG TERM GOAL #4   Title Pt to be walking 10 minutes twice a day to promote lymphatic circulation    Time 6   Period Weeks   Status On-going     PT LONG TERM GOAL #5   Title Pt to state she is able to walk for 10 minutes without difficulty ( pt limited to 10 minutes due to back issues)   Time 8   Period Weeks   Status On-going               Plan - 02/02/17 0911    Clinical Impression Statement Patient arrived to treatment with use of rollator although [facility] brought into clinic. Patient very limited today secondary to LBP and fatigue today. Patient able to ambulate  fairly well  without AD in clinic hallway although short step/stride length still noted. Patient limited greatly today due to increased LBP and fatigue from lack of sleep. Normal modalities response noted following removal of the modalities.   Rehab Potential Good   PT Frequency 2x / week   PT Duration 8 weeks   PT Treatment/Interventions Iontophoresis 46m/ml Dexamethasone;Moist Heat;Patient/family education;Therapeutic exercise;Therapeutic activities;Manual techniques;Vasopneumatic Device;Dry needling;Cryotherapy;Gait training   PT Next Visit Plan Continue standing exercises with attemp at gait training [facility].   PT Home Exercise Plan Instructed in TENS use, continue SLR, sit to stand, supine marching, LAQ   Consulted and Agree with Plan of Care Patient      Patient will benefit from skilled therapeutic intervention in order to improve the following deficits and impairments:  Decreased activity tolerance, Decreased strength, Pain, Postural dysfunction, Impaired perceived functional ability, Decreased balance, Decreased mobility  Visit Diagnosis: Difficulty in walking, not elsewhere classified  Chronic pain of left knee  Muscle weakness (generalized)     Problem List Patient Active Problem List   Diagnosis Date Noted  . [place]stable [place] ([place] 05/21/2016  . Chronic diastolic heart failure ([place] 03/29/2016  . Normal coronary arteries 2009 01/14/2015  . Obesity-BMI 40 01/14/2015  . Restless leg 01/14/2015  . Chest pain 01/14/2015  . Back pain 01/14/2015  . Renal insufficiency 01/14/2015  . Dementia- mild memory issues 01/14/2015  . Occult blood positive stool 12/14/2014  . Anemia 12/14/2014  . Family history of colon cancer 12/14/2014  . Change in bowel habits 12/14/2014  . GERD (gastroesophageal reflux disease) 12/14/2014  . Dyspnea 05/17/2012  . Lymphedema 05/17/2012  . Weight gain 05/17/2012  . HTN (hypertension) 05/17/2012    KWynelle Fanny PTA 02/02/2017, 9:37  AM  [facility] [address]Phone: [phone]  Fax:  [phone] Name: CKEMIAH BOOZMRN: [id number] of Birth: [birth year]

## 2017-02-04 ENCOUNTER — Encounter: Payer: Self-pay | Admitting: Physical Therapy

## 2017-02-04 ENCOUNTER — Ambulatory Visit: Payer: Medicare Other | Admitting: Physical Therapy

## 2017-02-04 DIAGNOSIS — R262 Difficulty in walking, not elsewhere classified: Secondary | ICD-10-CM

## 2017-02-04 DIAGNOSIS — G8929 Other chronic pain: Secondary | ICD-10-CM

## 2017-02-04 DIAGNOSIS — M25562 Pain in left knee: Secondary | ICD-10-CM

## 2017-02-04 DIAGNOSIS — M6281 Muscle weakness (generalized): Secondary | ICD-10-CM

## 2017-02-04 NOTE — Therapy (Addendum)
[facility] [place], [place], [address] Phone: [phone]   Fax:  [phone]  Physical Therapy Treatment  Patient Details  Name: [name] MRN: [id number] Date of Birth: 1946-01-03 Referring Provider: Earlie Server, MD  Encounter Date: 02/04/2017      PT End of Session - 02/04/17 0901    Visit Number 30   Number of Visits 36   Date for PT Re-Evaluation 02/17/17   PT Start Time 0818   PT Stop Time 0905   PT Time Calculation (min) 47 min   Activity Tolerance Patient tolerated treatment well   Behavior During Therapy [facility] for tasks assessed/performed      Past Medical History:  Diagnosis Date  . Anemia   . Anxiety   . Arthritis   . Brain lesion    2 types  . Cervical spondylosis with myelopathy   . Chest pain    Normal cardiac cath 5/09  . Congestive heart failure (CHF) ([place])   . Depression   . Dumping syndrome   . Edema   . Fatigue   . Fibromyalgia   . History of echocardiogram    a. Echo 03/20/16 (done at [facility] in [place], [place]):  mild LVH, EF 19%, normal diastolic function, mild LAE, MAC, RVSP 25 mmHg  . Hyperlipidemia   . Hypertension   . Hypothyroidism   . Lumbar spondylosis   . Lymphedema    seeing specialist for this  . Restless leg syndrome   . Rotator cuff injury   . Stroke [facility])    TIAs "mini strokes"- unsure of last TIA  . Syncope   . Tremors of nervous system     Past Surgical History:  Procedure Laterality Date  . APPENDECTOMY  1966  . CARDIAC CATHETERIZATION N/A 05/25/2016   Procedure: Right/Left Heart Cath and Coronary Angiography;  Surgeon: Larey Dresser, MD;  Location: [place] [facility];  Service: Cardiovascular;  Laterality: N/A;  . CHOLECYSTECTOMY  2015  . CHOLECYSTECTOMY  2015  . CORONARY ANGIOGRAM  2009   Normal coronaries  . Hip Bursa Surgery Bilateral   . KNEE ARTHROSCOPY Bilateral   . LUMBAR DISC SURGERY     x2  . LUMBAR FUSION    . RIGHT HEART CATH  N/A 11/13/2016   Procedure: Right Heart Cath with Cardiomems;  Surgeon: Larey Dresser, MD;  Location: [place] [facility];  Service: Cardiovascular;  Laterality: N/A;  . ROTATOR CUFF REPAIR Bilateral   . TOTAL ABDOMINAL HYSTERECTOMY    . TOTAL KNEE ARTHROPLASTY Left   . TOTAL KNEE ARTHROPLASTY Right 11/18/2012   Procedure: TOTAL KNEE ARTHROPLASTY;  Surgeon: Yvette Rack., MD;  Location: [place];  Service: Orthopedics;  Laterality: Right;    There were no vitals filed for this visit.      Subjective Assessment - 02/04/17 0822    Subjective Reports that she walked a lot yesterday outside and was give out on the way back to the truck as she was hurting. Still has not heard about surgery date.   Pertinent History Bilateral total knee replacements. Bilateral rotator cuff repairs   Limitations Walking   How long can you stand comfortably? Less than 5-10 minutes.   How long can you walk comfortably? < 5 min   Patient Stated Goals Get out of pain, walk better.    Currently in Pain? Yes   Pain Score 3    Pain Location Knee   Pain Orientation Left;Medial;Distal  Pain Descriptors / Indicators Discomfort   Pain Type Chronic pain   Pain Onset More than a month ago   Pain Score 4   Pain Location Back   Pain Orientation Lower            [facility] PT Assessment - 02/04/17 0001      Assessment   Medical Diagnosis left knee pain   Next MD Visit To be scheduled     Restrictions   Weight Bearing Restrictions No                     [facility] Adult PT Treatment/Exercise - 02/04/17 0001      Ambulation/Gait   Ambulation/Gait Yes   Ambulation/Gait Assistance 5: Supervision   Ambulation Distance (Feet) 420 Feet   Assistive device Small based quad cane;None   Gait Pattern Step-through pattern;Decreased arm swing - right;Decreased arm swing - left;Decreased step length - left;Decreased stance time - left;Decreased stride length;Decreased weight shift to left;Antalgic;Lateral trunk lean  to right;Narrow base of support   Ambulation Surface Level;Indoor;Unlevel;Outdoor;Paved   Stairs Yes   Stairs Assistance 6: Modified independent (Device/Increase time)   Stair Management Technique One rail Left;Step to pattern;Forwards;With cane   Number of Stairs 10   Height of Stairs 7   Door Management 6: Modified independent (Device/Increase time)   Ramp 6: Modified independent (Device)     Knee/Hip Exercises: Aerobic   Nustep L5 x10 min     Modalities   Modalities Electrical Stimulation;Cryotherapy     Cryotherapy   Number Minutes Cryotherapy 15 Minutes   Cryotherapy Location Knee   Type of Cryotherapy Ice pack     Electrical Stimulation   Electrical Stimulation Location L knee   Electrical Stimulation Action Pre-Mod   Electrical Stimulation Parameters 80-150 hz x15 min   Electrical Stimulation Goals Pain                  PT Short Term Goals - 10/05/16 1037      PT SHORT TERM GOAL #1   Title STG's=LTG's.           PT Long Term Goals - 02/04/17 9476      PT LONG TERM GOAL #1   Title Independent with a HEP.   Time 8   Period Weeks   Status Partially Met     PT LONG TERM GOAL #2   Title Stand 20 minutes with left knee pain not > 3/10.   Baseline pt reporting 4-5/10 with doing dishes and standing for 20 minutes   Time 8   Period Weeks   Status Not Met     PT LONG TERM GOAL #3   Title Perform ADL's with pain not > 3/10.   Baseline Pt reporting pain vaires with ADL's from 3-8/10.    Time 8   Period Weeks   Status Achieved     PT LONG TERM GOAL #4   Title Pt to be walking 10 minutes twice a day to promote lymphatic circulation    Time 6   Period Weeks   Status Achieved     PT LONG TERM GOAL #5   Title Pt to state she is able to walk for 10 minutes without difficulty ( pt limited to 10 minutes due to back issues)   Time 8   Period Weeks   Status Not Met  Unless pain medication taken per patient report  Plan -  02/04/17 0903    Clinical Impression Statement Patient arrived to treatment with low to mid level L knee and LBP today. Patient assessed with therapeutic activities such as ambulating ramps, stairs, parking lots with patient having greatest difficulty with incline in parking lot and with doorway. Patient reported feeling more comfortable without SBQC but still keeps it close for when pain increases. Gait without AD demonstrated with greater speed. Normal modalities response noted following removal of the modalities. Patient unable to meet certain goals secondary to pain.    Rehab Potential Good   PT Frequency 2x / week   PT Duration 8 weeks   PT Treatment/Interventions Iontophoresis 71m/ml Dexamethasone;Moist Heat;Patient/family education;Therapeutic exercise;Therapeutic activities;Manual techniques;Vasopneumatic Device;Dry needling;Cryotherapy;Gait training   PT Next Visit Plan Continue standing exercises with attemp at [facility]   PT Home Exercise Plan Instructed in TENS use, continue SLR, sit to stand, supine marching, LAQ   Consulted and Agree with Plan of Care Patient      Patient will benefit from skilled therapeutic intervention in order to improve the following deficits and impairments:  Decreased activity tolerance, Decreased strength, Pain, Postural dysfunction, Impaired perceived functional ability, Decreased balance, Decreased mobility  Visit Diagnosis: Difficulty in walking, not elsewhere classified  Chronic pain of left knee  Muscle weakness (generalized)     Problem List Patient Active Problem List   Diagnosis Date Noted  . [place] 05/21/2016  . Chronic diastolic heart failure [place] 03/29/2016  . Normal coronary arteries 2009 01/14/2015  . Obesity-BMI 40 01/14/2015  . Restless leg 01/14/2015  . Chest pain 01/14/2015  . Back pain 01/14/2015  . Renal insufficiency 01/14/2015  . Dementia- mild memory issues 01/14/2015  .  Occult blood positive stool 12/14/2014  . Anemia 12/14/2014  . Family history of colon cancer 12/14/2014  . Change in bowel habits 12/14/2014  . GERD (gastroesophageal reflux disease) 12/14/2014  . Dyspnea 05/17/2012  . Lymphedema 05/17/2012  . Weight gain 05/17/2012  . HTN (hypertension) 05/17/2012    KAhmed Prima PTA 02/04/17 9:54 AM  [facility] [place] [address]Phone: [phone]  Fax:  [phone] Name: CSHONI QUIJASMRN: [id number] of Birth: 615-Feb-1947 PHYSICAL THERAPY DISCHARGE SUMMARY  Visits from Start of Care: 30.  Current functional level related to goals / functional outcomes: See above.   Remaining deficits: See LTG section.   Education / Equipment: HEP. Plan: Patient agrees to discharge.  Patient goals were partially met. Patient is being discharged due to not returning since the last visit.  ?????         CMaliApplegate MPT

## 2017-02-08 ENCOUNTER — Telehealth (HOSPITAL_COMMUNITY): Payer: Self-pay

## 2017-02-08 NOTE — Telephone Encounter (Signed)
Patient calling CHF clinic to request lab results from last week. Patient concerned for Cr staying at 2.36 from 4 weeks ago. Patient denies s/s and reports being at baseline. Advised if she has any s/s to call our clinic. Also confirmed that we faxed hgbA1c to PCP and to call their office to discuss that result. Aware and agreeable.  Renee Pain, RN

## 2017-02-19 ENCOUNTER — Other Ambulatory Visit (INDEPENDENT_AMBULATORY_CARE_PROVIDER_SITE_OTHER): Payer: Self-pay | Admitting: Orthopaedic Surgery

## 2017-02-22 ENCOUNTER — Other Ambulatory Visit (INDEPENDENT_AMBULATORY_CARE_PROVIDER_SITE_OTHER): Payer: Self-pay | Admitting: Orthopaedic Surgery

## 2017-02-22 ENCOUNTER — Telehealth (HOSPITAL_COMMUNITY): Payer: Self-pay | Admitting: Adult Health

## 2017-02-22 DIAGNOSIS — Z96659 Presence of unspecified artificial knee joint: Secondary | ICD-10-CM

## 2017-02-22 DIAGNOSIS — T84068A Wear of articular bearing surface of other internal prosthetic joint, initial encounter: Secondary | ICD-10-CM

## 2017-02-22 NOTE — Pre-Procedure Instructions (Signed)
[name]  02/22/2017      [facility] - [place], [place] - [address] [address] Phone: [phone] Fax: [phone]    Your procedure is scheduled on July 10  Report to [facility] at [phone] A.M.  Call this number if you have problems the morning of surgery:  [phone]   Remember:  Do not eat food or drink liquids after midnight.   Take these medicines the morning of surgery with A SIP OF WATER amLODipine (NORVASC),  baclofen (LIORESAL), HYDROcodone-acetaminophen (NORCO) if needed, lamoTRIgine (LAMICTAL), levothyroxine (SYNTHROID, LEVOTHROID), memantine (NAMENDA), ranitidine (ZANTAC), rOPINIRole (REQUIP),   7 days prior to surgery STOP taking any Aspirin, Aleve, Naproxen, Ibuprofen, Motrin, Advil, Goody's, BC's, all herbal medications, fish oil, and all vitamins  FOLLOW YOUR DOCTORS INSTRUCTIONS ABOUT PLAVIX  WHAT DO I DO ABOUT MY DIABETES MEDICATION?   Marland Kitchen Do not take oral diabetes medicines (pills) the morning of surgery. glipiZIDE (GLUCOTROL XL)    How to Manage Your Diabetes Before and After Surgery  Why is it important to control my blood sugar before and after surgery? . Improving blood sugar levels before and after surgery helps healing and can limit problems. . A way of improving blood sugar control is eating a healthy diet by: o  Eating less sugar and carbohydrates o  Increasing activity/exercise o  Talking with your doctor about reaching your blood sugar goals . High blood sugars (greater than 180 mg/dL) can raise your risk of infections and slow your recovery, so you will need to focus on controlling your diabetes during the weeks before surgery. . Make sure that the doctor who takes care of your diabetes knows about your planned surgery including the date and location.  How do I manage my blood sugar before surgery? . Check your blood sugar at least 4 times a day, starting 2 days before  surgery, to make sure that the level is not too high or low. o Check your blood sugar the morning of your surgery when you wake up and every 2 hours until you get to the Short Stay unit. . If your blood sugar is less than 70 mg/dL, you will need to treat for low blood sugar: o Do not take insulin. o Treat a low blood sugar (less than 70 mg/dL) with  cup of clear juice (cranberry or apple), 4 glucose tablets, OR glucose gel. o Recheck blood sugar in 15 minutes after treatment (to make sure it is greater than 70 mg/dL). If your blood sugar is not greater than 70 mg/dL on recheck, call [phone] for further instructions. . Report your blood sugar to the short stay nurse when you get to Short Stay.  . If you are admitted to the hospital after surgery: o Your blood sugar will be checked by the staff and you will probably be given insulin after surgery (instead of oral diabetes medicines) to make sure you have good blood sugar levels. o The goal for blood sugar control after surgery is 80-180 mg/dL.     Do not wear jewelry, make-up or nail polish.  Do not wear lotions, powders, or perfumes, or deoderant.  Do not shave 48 hours prior to surgery.  Men may shave face and neck.  Do not bring valuables to the hospital.  [facility] is not responsible for any belongings or valuables.  Contacts, dentures or bridgework may not be worn into surgery.  Leave your suitcase  in the car.  After surgery it may be brought to your room.  For patients admitted to the hospital, discharge time will be determined by your treatment team.  Patients discharged the day of surgery will not be allowed to drive home.    Special instructions:   [place]- Preparing For Surgery  Before surgery, you can play an important role. Because skin is not sterile, your skin needs to be as free of germs as possible. You can reduce the number of germs on your skin by washing with CHG (chlorahexidine gluconate) Soap before  surgery.  CHG is an antiseptic cleaner which kills germs and bonds with the skin to continue killing germs even after washing.  Please do not use if you have an allergy to CHG or antibacterial soaps. If your skin becomes reddened/irritated stop using the CHG.  Do not shave (including legs and underarms) for at least 48 hours prior to first CHG shower. It is OK to shave your face.  Please follow these instructions carefully.   1. Shower the NIGHT BEFORE SURGERY and the MORNING OF SURGERY with CHG.   2. If you chose to wash your hair, wash your hair first as usual with your normal shampoo.  3. After you shampoo, rinse your hair and body thoroughly to remove the shampoo.  4. Use CHG as you would any other liquid soap. You can apply CHG directly to the skin and wash gently with a scrungie or a clean washcloth.   5. Apply the CHG Soap to your body ONLY FROM THE NECK DOWN.  Do not use on open wounds or open sores. Avoid contact with your eyes, ears, mouth and genitals (private parts). Wash genitals (private parts) with your normal soap.  6. Wash thoroughly, paying special attention to the area where your surgery will be performed.  7. Thoroughly rinse your body with warm water from the neck down.  8. DO NOT shower/wash with your normal soap after using and rinsing off the CHG Soap.  9. Pat yourself dry with a CLEAN TOWEL.   10. Wear CLEAN PAJAMAS   11. Place CLEAN SHEETS on your bed the night of your first shower and DO NOT SLEEP WITH PETS.    Day of Surgery: Do not apply any deodorants/lotions. Please wear clean clothes to the hospital/surgery center.      Please read over the following fact sheets that you were given.

## 2017-02-22 NOTE — Telephone Encounter (Signed)
   Cardiomems elevated today.  She endorses medication compliance.   I instructed her to take an extra 40 mg of torsemide in the afternoon today and tomorrow.   [name] verbalized understanding.   Jaylyn Iyer NP-C  12:09 PM

## 2017-02-23 ENCOUNTER — Encounter (HOSPITAL_COMMUNITY): Payer: Self-pay

## 2017-02-23 ENCOUNTER — Encounter (HOSPITAL_COMMUNITY)
Admission: RE | Admit: 2017-02-23 | Discharge: 2017-02-23 | Disposition: A | Discharge status: Home / Self Care | Payer: Medicare Other | Source: Ambulatory Visit | Attending: Orthopaedic Surgery | Admitting: Orthopaedic Surgery

## 2017-02-23 DIAGNOSIS — Z01810 Encounter for preprocedural cardiovascular examination: Secondary | ICD-10-CM | POA: Insufficient documentation

## 2017-02-23 DIAGNOSIS — Z01812 Encounter for preprocedural laboratory examination: Secondary | ICD-10-CM | POA: Diagnosis present

## 2017-02-23 HISTORY — DX: Constipation, unspecified: K59.00

## 2017-02-23 HISTORY — DX: Dyspnea, unspecified: R06.00

## 2017-02-23 LAB — BASIC METABOLIC PANEL
Anion gap: 13 (ref 5–15)
BUN: 64 mg/dL — ABNORMAL HIGH (ref 6–20)
CALCIUM: 10.7 mg/dL — AB (ref 8.9–10.3)
CO2: 28 mmol/L (ref 22–32)
CREATININE: 2.09 mg/dL — AB (ref 0.44–1.00)
Chloride: 98 mmol/L — ABNORMAL LOW (ref 101–111)
GFR calc Af Amer: 26 mL/min — ABNORMAL LOW (ref >60)
GFR calc non Af Amer: 23 mL/min — ABNORMAL LOW (ref >60)
GLUCOSE: 77 mg/dL (ref 65–99)
Potassium: 3.6 mmol/L (ref 3.5–5.1)
Sodium: 139 mmol/L (ref 135–145)

## 2017-02-23 LAB — CBC
HCT: 39.8 % (ref 36.0–46.0)
Hemoglobin: 12.8 g/dL (ref 12.0–15.0)
MCH: 30.1 pg (ref 26.0–34.0)
MCHC: 32.2 g/dL (ref 30.0–36.0)
MCV: 93.6 fL (ref 78.0–100.0)
PLATELETS: 260 10*3/uL (ref 150–400)
RBC: 4.25 MIL/uL (ref 3.87–5.11)
RDW: 15.3 % (ref 11.5–15.5)
WBC: 9.5 10*3/uL (ref 4.0–10.5)

## 2017-02-23 LAB — GLUCOSE, CAPILLARY: Glucose-Capillary: 83 mg/dL (ref 65–99)

## 2017-02-23 LAB — SURGICAL PCR SCREEN
MRSA, PCR: NEGATIVE
Staphylococcus aureus: NEGATIVE

## 2017-02-23 NOTE — Progress Notes (Signed)
PCP: Dr. Edrick Oh Cardiologist: Dr. Kerin Perna seen 2 months ago  EKG: 01/10/17 CXR: 01/10/17 ECHO: 09/2016 Stress Test: 03/2016 Cardiac Cath: 11/13/2016  Fasting Blood Sugar- 85 Checks Blood Sugar once a day--mornings  Patient denies shortness of breath, fever, cough, and chest pain at PAT appointment.  Patient verbalized understanding of instructions provided today at the PAT appointment.  Patient asked to review instructions at home and day of surgery.   Pt reports that she no longer takes Plavix, that she stopped in March 2018 after Cardiomems unit was placed.  States she likes it being kept on her record since she used to take it.

## 2017-02-25 NOTE — Progress Notes (Signed)
Anesthesia Chart Review:  Pt is a 71 year old female scheduled for L knee revision of poly liner on 03/02/2017 with Jean Rosenthal, MD  - PCP is Dione Housekeeper, MD, last office visit 02/16/17 (notes in care everywhere) - Cardiologist is Loralie Champagne, MD. Last office visit 02/01/17 with Barrington Ellison, PA who is aware of upcoming surgery, recommended surgery be done at [facility] due to risk of volume overload after surgery.  - Nephrologist is Pearson Grippe, MD  PMH includes:  Chronic diastolic CHF (s/p Cardiomems implantation 11/13/16), HTN, DM, hyperlipidemia, TIA, syncope, CKD (stage III), anemia, hypothyroidism, OSA, brain lesion. Never smoker. BMI 41.5. S/p cholecystectomy, L TKA, TAH. S/p  R TKA 11/18/12  Medications include: Amlodipine, ASA 81 mg, Plavix, Aricept, fentanyl patch, glipizide, Lamictal, levothyroxine, Namenda, metolazone, pravastatin, Zantac, spironolactone, torsemide  Preoperative labs reviewed.   - Glucose 77. HbA1c was 5.6 on 02/01/17.  - Cr 2.09, BUN 64. This is consistent with prior results.   CXR 01/10/17: No acute abnormality noted.  EKG 01/10/17: NSR. LAFB.   R cardiac cath 11/13/16:  1. Normal right and left heart filling pressures.  2. Successful CardioMems placement.   Echo 10/13/16:  - Left ventricle: The cavity size was normal. There was mild concentric hypertrophy. Systolic function was normal. The estimated ejection fraction was in the range of 55% to 60%. Wall motion was normal; there were no regional wall motion abnormalities. Doppler parameters are consistent with abnormal left ventricular relaxation (grade 1 diastolic dysfunction). - Aortic valve: Transvalvular velocity was within the normal range. There was no stenosis. There was no regurgitation. - Mitral valve: There was trivial regurgitation. - Right ventricle: The cavity size was normal. Wall thickness was normal. Systolic function was normal. - Atrial septum: No defect or patent foramen ovale was  identified by color flow Doppler. - Tricuspid valve: There was trivial regurgitation. - Pulmonary arteries: Systolic pressure was within the normal range. PA peak pressure: 26 mm Hg (S).  R/L cardiac cath 05/25/16:  1. Normal filling pressures and cardiac output, no pulmonary hypertension.  2. No obstructive coronary artery disease (CX 30%).  If no changes, I anticipate pt can proceed with surgery as scheduled.   Willeen Cass, FNP-BC [facility] Short Stay Surgical Center/Anesthesiology Phone: [phone] 02/25/2017 11:09 AM

## 2017-03-02 ENCOUNTER — Inpatient Hospital Stay (HOSPITAL_COMMUNITY)
Admission: RE | Admit: 2017-03-02 | Discharge: 2017-03-05 | DRG: 488 | Disposition: A | Discharge status: Skilled Nursing Facility | Payer: Medicare Other | Source: Ambulatory Visit | Attending: Orthopaedic Surgery | Admitting: Orthopaedic Surgery

## 2017-03-02 ENCOUNTER — Inpatient Hospital Stay (HOSPITAL_COMMUNITY): Payer: Medicare Other | Admitting: Certified Registered"

## 2017-03-02 ENCOUNTER — Encounter (HOSPITAL_COMMUNITY)
Admission: RE | Disposition: A | Discharge status: Skilled Nursing Facility | Payer: Self-pay | Source: Ambulatory Visit | Attending: Orthopaedic Surgery

## 2017-03-02 ENCOUNTER — Inpatient Hospital Stay (HOSPITAL_COMMUNITY): Payer: Medicare Other | Admitting: Emergency Medicine

## 2017-03-02 ENCOUNTER — Inpatient Hospital Stay (HOSPITAL_COMMUNITY): Payer: Medicare Other

## 2017-03-02 DIAGNOSIS — Z7902 Long term (current) use of antithrombotics/antiplatelets: Secondary | ICD-10-CM

## 2017-03-02 DIAGNOSIS — K219 Gastro-esophageal reflux disease without esophagitis: Secondary | ICD-10-CM | POA: Diagnosis present

## 2017-03-02 DIAGNOSIS — Z8249 Family history of ischemic heart disease and other diseases of the circulatory system: Secondary | ICD-10-CM

## 2017-03-02 DIAGNOSIS — Y838 Other surgical procedures as the cause of abnormal reaction of the patient, or of later complication, without mention of misadventure at the time of the procedure: Secondary | ICD-10-CM | POA: Diagnosis present

## 2017-03-02 DIAGNOSIS — Z6841 Body Mass Index (BMI) 40.0 and over, adult: Secondary | ICD-10-CM | POA: Diagnosis not present

## 2017-03-02 DIAGNOSIS — F0390 Unspecified dementia without behavioral disturbance: Secondary | ICD-10-CM | POA: Diagnosis present

## 2017-03-02 DIAGNOSIS — G2581 Restless legs syndrome: Secondary | ICD-10-CM | POA: Diagnosis present

## 2017-03-02 DIAGNOSIS — N183 Chronic kidney disease, stage 3 (moderate): Secondary | ICD-10-CM | POA: Diagnosis present

## 2017-03-02 DIAGNOSIS — M65862 Other synovitis and tenosynovitis, left lower leg: Secondary | ICD-10-CM | POA: Diagnosis present

## 2017-03-02 DIAGNOSIS — Z7983 Long term (current) use of bisphosphonates: Secondary | ICD-10-CM

## 2017-03-02 DIAGNOSIS — Z8673 Personal history of transient ischemic attack (TIA), and cerebral infarction without residual deficits: Secondary | ICD-10-CM

## 2017-03-02 DIAGNOSIS — I5032 Chronic diastolic (congestive) heart failure: Secondary | ICD-10-CM | POA: Diagnosis present

## 2017-03-02 DIAGNOSIS — T84068A Wear of articular bearing surface of other internal prosthetic joint, initial encounter: Secondary | ICD-10-CM

## 2017-03-02 DIAGNOSIS — Z801 Family history of malignant neoplasm of trachea, bronchus and lung: Secondary | ICD-10-CM | POA: Diagnosis not present

## 2017-03-02 DIAGNOSIS — E039 Hypothyroidism, unspecified: Secondary | ICD-10-CM | POA: Diagnosis present

## 2017-03-02 DIAGNOSIS — E785 Hyperlipidemia, unspecified: Secondary | ICD-10-CM | POA: Diagnosis present

## 2017-03-02 DIAGNOSIS — Z841 Family history of disorders of kidney and ureter: Secondary | ICD-10-CM

## 2017-03-02 DIAGNOSIS — M797 Fibromyalgia: Secondary | ICD-10-CM | POA: Diagnosis present

## 2017-03-02 DIAGNOSIS — T84062A Wear of articular bearing surface of internal prosthetic right knee joint, initial encounter: Secondary | ICD-10-CM | POA: Diagnosis not present

## 2017-03-02 DIAGNOSIS — T84063A Wear of articular bearing surface of internal prosthetic left knee joint, initial encounter: Secondary | ICD-10-CM

## 2017-03-02 DIAGNOSIS — Z7984 Long term (current) use of oral hypoglycemic drugs: Secondary | ICD-10-CM | POA: Diagnosis not present

## 2017-03-02 DIAGNOSIS — E669 Obesity, unspecified: Secondary | ICD-10-CM | POA: Diagnosis present

## 2017-03-02 DIAGNOSIS — Z96652 Presence of left artificial knee joint: Secondary | ICD-10-CM | POA: Diagnosis not present

## 2017-03-02 DIAGNOSIS — Z7982 Long term (current) use of aspirin: Secondary | ICD-10-CM | POA: Diagnosis not present

## 2017-03-02 DIAGNOSIS — Z79899 Other long term (current) drug therapy: Secondary | ICD-10-CM

## 2017-03-02 DIAGNOSIS — Z96659 Presence of unspecified artificial knee joint: Secondary | ICD-10-CM

## 2017-03-02 DIAGNOSIS — E1122 Type 2 diabetes mellitus with diabetic chronic kidney disease: Secondary | ICD-10-CM | POA: Diagnosis present

## 2017-03-02 DIAGNOSIS — Z800 Family history of malignant neoplasm of digestive organs: Secondary | ICD-10-CM

## 2017-03-02 DIAGNOSIS — I130 Hypertensive heart and chronic kidney disease with heart failure and stage 1 through stage 4 chronic kidney disease, or unspecified chronic kidney disease: Secondary | ICD-10-CM | POA: Diagnosis present

## 2017-03-02 HISTORY — DX: Chronic kidney disease, stage 3 (moderate): N18.3

## 2017-03-02 HISTORY — DX: Other chronic pain: G89.29

## 2017-03-02 HISTORY — DX: Headache, unspecified: R51.9

## 2017-03-02 HISTORY — DX: Low back pain: M54.5

## 2017-03-02 HISTORY — DX: Chronic kidney disease, stage 3 unspecified: N18.30

## 2017-03-02 HISTORY — DX: Dependence on other enabling machines and devices: Z99.89

## 2017-03-02 HISTORY — DX: Pneumonia, unspecified organism: J18.9

## 2017-03-02 HISTORY — DX: Migraine, unspecified, not intractable, without status migrainosus: G43.909

## 2017-03-02 HISTORY — DX: Obstructive sleep apnea (adult) (pediatric): G47.33

## 2017-03-02 HISTORY — DX: Type 2 diabetes mellitus without complications: E11.9

## 2017-03-02 HISTORY — DX: Low back pain, unspecified: M54.50

## 2017-03-02 HISTORY — DX: Headache: R51

## 2017-03-02 HISTORY — PX: I & D KNEE WITH POLY EXCHANGE: SHX5024

## 2017-03-02 HISTORY — DX: Gastro-esophageal reflux disease without esophagitis: K21.9

## 2017-03-02 HISTORY — PX: REVISION TOTAL KNEE ARTHROPLASTY: SHX767

## 2017-03-02 LAB — GLUCOSE, CAPILLARY
GLUCOSE-CAPILLARY: 89 mg/dL (ref 65–99)
GLUCOSE-CAPILLARY: 103 mg/dL — AB (ref 65–99)
Glucose-Capillary: 163 mg/dL — ABNORMAL HIGH (ref 65–99)

## 2017-03-02 SURGERY — IRRIGATION AND DEBRIDEMENT KNEE WITH POLY EXCHANGE
Anesthesia: General | Site: Knee | Laterality: Left

## 2017-03-02 MED ORDER — HYDROMORPHONE HCL 1 MG/ML IJ SOLN
0.2500 mg | INTRAMUSCULAR | Status: DC | PRN
  Administered 2017-03-02: 1 mg via INTRAVENOUS

## 2017-03-02 MED ORDER — PHENOL 1.4 % MT LIQD: 1.0000 | OROMUCOSAL | Status: DC | PRN

## 2017-03-02 MED ORDER — DEXAMETHASONE SODIUM PHOSPHATE 4 MG/ML IJ SOLN
INTRAMUSCULAR | Status: DC | PRN
  Administered 2017-03-02: 4 mg via INTRAVENOUS

## 2017-03-02 MED ORDER — SUCCINYLCHOLINE CHLORIDE 200 MG/10ML IV SOSY
PREFILLED_SYRINGE | INTRAVENOUS | Status: DC | PRN
  Administered 2017-03-02: 100 mg via INTRAVENOUS

## 2017-03-02 MED ORDER — OXYCODONE HCL 5 MG PO TABS
5.0000 mg | ORAL_TABLET | ORAL | Status: DC | PRN
  Administered 2017-03-02 – 2017-03-05 (×21): 15 mg via ORAL
  Filled 2017-03-02 (×19): qty 3

## 2017-03-02 MED ORDER — SUGAMMADEX SODIUM 200 MG/2ML IV SOLN
INTRAVENOUS | Status: DC | PRN
  Administered 2017-03-02: 200 mg via INTRAVENOUS

## 2017-03-02 MED ORDER — DULOXETINE HCL 20 MG PO CPEP
20.0000 mg | ORAL_CAPSULE | Freq: Every day | ORAL | Status: DC
  Administered 2017-03-02 – 2017-03-04 (×3): 20 mg via ORAL
  Filled 2017-03-02 (×4): qty 1

## 2017-03-02 MED ORDER — SODIUM CHLORIDE 0.9 % IR SOLN
Status: DC | PRN
  Administered 2017-03-02: 3000 mL

## 2017-03-02 MED ORDER — HYDROMORPHONE HCL 1 MG/ML IJ SOLN
INTRAMUSCULAR | Status: AC
  Filled 2017-03-02: qty 1

## 2017-03-02 MED ORDER — ASPIRIN EC 325 MG PO TBEC
325.0000 mg | DELAYED_RELEASE_TABLET | Freq: Two times a day (BID) | ORAL | Status: DC
  Administered 2017-03-03 – 2017-03-05 (×5): 325 mg via ORAL
  Filled 2017-03-02 (×5): qty 1

## 2017-03-02 MED ORDER — BACLOFEN 10 MG PO TABS
10.0000 mg | ORAL_TABLET | Freq: Three times a day (TID) | ORAL | Status: DC
  Administered 2017-03-02 – 2017-03-05 (×8): 10 mg via ORAL
  Filled 2017-03-02 (×8): qty 1

## 2017-03-02 MED ORDER — SPIRONOLACTONE 25 MG PO TABS
25.0000 mg | ORAL_TABLET | Freq: Two times a day (BID) | ORAL | Status: DC
  Administered 2017-03-03 – 2017-03-05 (×5): 25 mg via ORAL
  Filled 2017-03-02 (×5): qty 1

## 2017-03-02 MED ORDER — FAMOTIDINE 20 MG PO TABS
20.0000 mg | ORAL_TABLET | Freq: Two times a day (BID) | ORAL | Status: DC
  Administered 2017-03-02 – 2017-03-05 (×6): 20 mg via ORAL
  Filled 2017-03-02 (×6): qty 1

## 2017-03-02 MED ORDER — SODIUM CHLORIDE 0.9 % IV SOLN
INTRAVENOUS | Status: DC
  Administered 2017-03-02: 19:00:00 via INTRAVENOUS

## 2017-03-02 MED ORDER — OXYCODONE HCL 5 MG PO TABS
ORAL_TABLET | ORAL | Status: AC
  Administered 2017-03-02: 15 mg via ORAL
  Filled 2017-03-02: qty 3

## 2017-03-02 MED ORDER — CEFAZOLIN SODIUM-DEXTROSE 2-4 GM/100ML-% IV SOLN
2.0000 g | INTRAVENOUS | Status: AC
  Administered 2017-03-02: 2 g via INTRAVENOUS

## 2017-03-02 MED ORDER — METOCLOPRAMIDE HCL 5 MG/ML IJ SOLN: 5.0000 mg | Freq: Three times a day (TID) | INTRAMUSCULAR | Status: DC | PRN

## 2017-03-02 MED ORDER — MIDAZOLAM HCL 2 MG/2ML IJ SOLN
INTRAMUSCULAR | Status: AC
  Filled 2017-03-02: qty 2

## 2017-03-02 MED ORDER — MENTHOL 3 MG MT LOZG
1.0000 | LOZENGE | OROMUCOSAL | Status: DC | PRN
Start: 2017-03-02 — End: 2017-03-05
  Filled 2017-03-02: qty 9

## 2017-03-02 MED ORDER — SODIUM CHLORIDE 0.9 % IV SOLN
INTRAVENOUS | Status: DC
  Administered 2017-03-02: 11:00:00 via INTRAVENOUS

## 2017-03-02 MED ORDER — SUCCINYLCHOLINE CHLORIDE 200 MG/10ML IV SOSY
PREFILLED_SYRINGE | INTRAVENOUS | Status: AC
  Filled 2017-03-02: qty 10

## 2017-03-02 MED ORDER — ONDANSETRON HCL 4 MG PO TABS: 4.0000 mg | ORAL_TABLET | Freq: Four times a day (QID) | ORAL | Status: DC | PRN

## 2017-03-02 MED ORDER — METHOCARBAMOL 500 MG PO TABS
500.0000 mg | ORAL_TABLET | Freq: Four times a day (QID) | ORAL | Status: DC | PRN
  Administered 2017-03-02 – 2017-03-05 (×10): 500 mg via ORAL
  Filled 2017-03-02 (×11): qty 1

## 2017-03-02 MED ORDER — LEVOTHYROXINE SODIUM 75 MCG PO TABS
75.0000 ug | ORAL_TABLET | Freq: Every day | ORAL | Status: DC
  Administered 2017-03-03 – 2017-03-05 (×3): 75 ug via ORAL
  Filled 2017-03-02 (×3): qty 1

## 2017-03-02 MED ORDER — GABAPENTIN 100 MG PO CAPS
100.0000 mg | ORAL_CAPSULE | Freq: Every day | ORAL | Status: DC
  Administered 2017-03-02 – 2017-03-04 (×3): 100 mg via ORAL
  Filled 2017-03-02 (×3): qty 1

## 2017-03-02 MED ORDER — PROMETHAZINE HCL 25 MG/ML IJ SOLN
6.2500 mg | INTRAMUSCULAR | Status: DC | PRN
  Administered 2017-03-02: 12.5 mg via INTRAVENOUS

## 2017-03-02 MED ORDER — SENNOSIDES-DOCUSATE SODIUM 8.6-50 MG PO TABS
2.0000 | ORAL_TABLET | Freq: Every day | ORAL | Status: DC
  Administered 2017-03-02 – 2017-03-04 (×3): 2 via ORAL
  Filled 2017-03-02 (×5): qty 2

## 2017-03-02 MED ORDER — ONDANSETRON HCL 4 MG/2ML IJ SOLN
INTRAMUSCULAR | Status: AC
  Filled 2017-03-02: qty 2

## 2017-03-02 MED ORDER — ACETAMINOPHEN 650 MG RE SUPP: 650.0000 mg | Freq: Four times a day (QID) | RECTAL | Status: DC | PRN

## 2017-03-02 MED ORDER — FENTANYL CITRATE (PF) 100 MCG/2ML IJ SOLN
INTRAMUSCULAR | Status: AC
  Administered 2017-03-02: 100 ug
  Filled 2017-03-02: qty 2

## 2017-03-02 MED ORDER — MIDAZOLAM HCL 2 MG/2ML IJ SOLN
INTRAMUSCULAR | Status: DC | PRN
  Administered 2017-03-02: 2 mg via INTRAVENOUS

## 2017-03-02 MED ORDER — METHOCARBAMOL 1000 MG/10ML IJ SOLN: 500.0000 mg | Freq: Four times a day (QID) | INTRAVENOUS | Status: DC | PRN

## 2017-03-02 MED ORDER — GLIPIZIDE ER 2.5 MG PO TB24
2.5000 mg | ORAL_TABLET | Freq: Every day | ORAL | Status: DC
  Administered 2017-03-03 – 2017-03-05 (×3): 2.5 mg via ORAL
  Filled 2017-03-02 (×3): qty 1

## 2017-03-02 MED ORDER — TORSEMIDE 20 MG PO TABS
80.0000 mg | ORAL_TABLET | Freq: Every day | ORAL | Status: DC
  Administered 2017-03-03 – 2017-03-05 (×3): 80 mg via ORAL
  Filled 2017-03-02 (×3): qty 4

## 2017-03-02 MED ORDER — LACTATED RINGERS IV SOLN
INTRAVENOUS | Status: DC | PRN
  Administered 2017-03-02: 12:00:00 via INTRAVENOUS

## 2017-03-02 MED ORDER — PROPOFOL 10 MG/ML IV BOLUS
INTRAVENOUS | Status: AC
  Filled 2017-03-02: qty 20

## 2017-03-02 MED ORDER — PROMETHAZINE HCL 25 MG/ML IJ SOLN
INTRAMUSCULAR | Status: AC
  Administered 2017-03-02: 12.5 mg via INTRAVENOUS
  Filled 2017-03-02: qty 1

## 2017-03-02 MED ORDER — ROPIVACAINE HCL 5 MG/ML IJ SOLN
INTRAMUSCULAR | Status: DC | PRN
  Administered 2017-03-02: 30 mL via PERINEURAL

## 2017-03-02 MED ORDER — METOCLOPRAMIDE HCL 5 MG PO TABS: 5.0000 mg | ORAL_TABLET | Freq: Three times a day (TID) | ORAL | Status: DC | PRN

## 2017-03-02 MED ORDER — DEXAMETHASONE SODIUM PHOSPHATE 10 MG/ML IJ SOLN: INTRAMUSCULAR | Status: DC | PRN

## 2017-03-02 MED ORDER — ALENDRONATE SODIUM 70 MG PO TABS: 70.0000 mg | ORAL_TABLET | ORAL | Status: DC

## 2017-03-02 MED ORDER — DEXAMETHASONE SODIUM PHOSPHATE 10 MG/ML IJ SOLN
INTRAMUSCULAR | Status: AC
  Filled 2017-03-02: qty 1

## 2017-03-02 MED ORDER — MIDAZOLAM HCL 2 MG/2ML IJ SOLN
INTRAMUSCULAR | Status: AC
  Administered 2017-03-02: 2 mg
  Filled 2017-03-02: qty 2

## 2017-03-02 MED ORDER — LIDOCAINE 2% (20 MG/ML) 5 ML SYRINGE
INTRAMUSCULAR | Status: DC | PRN
  Administered 2017-03-02: 60 mg via INTRAVENOUS

## 2017-03-02 MED ORDER — DONEPEZIL HCL 10 MG PO TABS
10.0000 mg | ORAL_TABLET | Freq: Every day | ORAL | Status: DC
  Administered 2017-03-02 – 2017-03-04 (×3): 10 mg via ORAL
  Filled 2017-03-02 (×3): qty 1

## 2017-03-02 MED ORDER — METHOCARBAMOL 500 MG PO TABS
ORAL_TABLET | ORAL | Status: AC
  Filled 2017-03-02: qty 1

## 2017-03-02 MED ORDER — FENTANYL CITRATE (PF) 250 MCG/5ML IJ SOLN
INTRAMUSCULAR | Status: AC
  Filled 2017-03-02: qty 5

## 2017-03-02 MED ORDER — ESCITALOPRAM OXALATE 10 MG PO TABS
30.0000 mg | ORAL_TABLET | Freq: Every day | ORAL | Status: DC
  Administered 2017-03-03 – 2017-03-04 (×2): 30 mg via ORAL
  Filled 2017-03-02 (×2): qty 3

## 2017-03-02 MED ORDER — PROPOFOL 10 MG/ML IV BOLUS
INTRAVENOUS | Status: DC | PRN
  Administered 2017-03-02: 150 mg via INTRAVENOUS

## 2017-03-02 MED ORDER — DIPHENHYDRAMINE HCL 12.5 MG/5ML PO ELIX: 12.5000 mg | ORAL_SOLUTION | ORAL | Status: DC | PRN

## 2017-03-02 MED ORDER — ACETAMINOPHEN 325 MG PO TABS
650.0000 mg | ORAL_TABLET | Freq: Four times a day (QID) | ORAL | Status: DC | PRN
  Administered 2017-03-04 – 2017-03-05 (×2): 650 mg via ORAL
  Filled 2017-03-02 (×2): qty 2

## 2017-03-02 MED ORDER — AMLODIPINE BESYLATE 10 MG PO TABS
10.0000 mg | ORAL_TABLET | Freq: Every day | ORAL | Status: DC
  Administered 2017-03-03 – 2017-03-05 (×3): 10 mg via ORAL
  Filled 2017-03-02 (×3): qty 1

## 2017-03-02 MED ORDER — FENTANYL CITRATE (PF) 250 MCG/5ML IJ SOLN
INTRAMUSCULAR | Status: DC | PRN
  Administered 2017-03-02 (×2): 50 ug via INTRAVENOUS
  Administered 2017-03-02: 25 ug via INTRAVENOUS
  Administered 2017-03-02 (×2): 50 ug via INTRAVENOUS
  Administered 2017-03-02: 25 ug via INTRAVENOUS

## 2017-03-02 MED ORDER — PRAVASTATIN SODIUM 40 MG PO TABS
80.0000 mg | ORAL_TABLET | Freq: Every day | ORAL | Status: DC
  Administered 2017-03-02 – 2017-03-04 (×3): 80 mg via ORAL
  Filled 2017-03-02 (×3): qty 2

## 2017-03-02 MED ORDER — CEFAZOLIN SODIUM-DEXTROSE 1-4 GM/50ML-% IV SOLN
1.0000 g | Freq: Four times a day (QID) | INTRAVENOUS | Status: AC
  Administered 2017-03-02 – 2017-03-03 (×2): 1 g via INTRAVENOUS
  Filled 2017-03-02 (×2): qty 50

## 2017-03-02 MED ORDER — LAMOTRIGINE 100 MG PO TABS
100.0000 mg | ORAL_TABLET | Freq: Two times a day (BID) | ORAL | Status: DC
  Administered 2017-03-02 – 2017-03-05 (×6): 100 mg via ORAL
  Filled 2017-03-02 (×6): qty 1

## 2017-03-02 MED ORDER — MEMANTINE HCL 10 MG PO TABS
10.0000 mg | ORAL_TABLET | Freq: Two times a day (BID) | ORAL | Status: DC
  Administered 2017-03-02 – 2017-03-05 (×6): 10 mg via ORAL
  Filled 2017-03-02 (×7): qty 1

## 2017-03-02 MED ORDER — OXYCODONE HCL 5 MG PO TABS
ORAL_TABLET | ORAL | Status: AC
  Filled 2017-03-02: qty 3

## 2017-03-02 MED ORDER — CEFAZOLIN SODIUM-DEXTROSE 2-4 GM/100ML-% IV SOLN
INTRAVENOUS | Status: AC
  Filled 2017-03-02: qty 100

## 2017-03-02 MED ORDER — ONDANSETRON HCL 4 MG/2ML IJ SOLN: 4.0000 mg | Freq: Four times a day (QID) | INTRAMUSCULAR | Status: DC | PRN

## 2017-03-02 MED ORDER — MORPHINE SULFATE (PF) 4 MG/ML IV SOLN
2.0000 mg | INTRAVENOUS | Status: DC | PRN
  Administered 2017-03-02 – 2017-03-03 (×5): 4 mg via INTRAVENOUS
  Filled 2017-03-02 (×5): qty 1

## 2017-03-02 MED ORDER — FOLIC ACID 1 MG PO TABS
1.0000 mg | ORAL_TABLET | Freq: Two times a day (BID) | ORAL | Status: DC
  Administered 2017-03-02 – 2017-03-05 (×6): 1 mg via ORAL
  Filled 2017-03-02 (×6): qty 1

## 2017-03-02 MED ORDER — ONDANSETRON HCL 4 MG/2ML IJ SOLN
INTRAMUSCULAR | Status: DC | PRN
Start: 2017-03-02 — End: 2017-03-02
  Administered 2017-03-02: 4 mg via INTRAVENOUS

## 2017-03-02 MED ORDER — 0.9 % SODIUM CHLORIDE (POUR BTL) OPTIME
TOPICAL | Status: DC | PRN
  Administered 2017-03-02: 1000 mL

## 2017-03-02 MED ORDER — ROPINIROLE HCL 1 MG PO TABS
1.0000 mg | ORAL_TABLET | Freq: Four times a day (QID) | ORAL | Status: DC
  Administered 2017-03-02 – 2017-03-05 (×11): 1 mg via ORAL
  Filled 2017-03-02 (×11): qty 1

## 2017-03-02 MED ORDER — LIDOCAINE HCL (CARDIAC) 20 MG/ML IV SOLN
INTRAVENOUS | Status: AC
  Filled 2017-03-02: qty 5

## 2017-03-02 MED ORDER — PROPOFOL 1000 MG/100ML IV EMUL
INTRAVENOUS | Status: AC
  Filled 2017-03-02: qty 100

## 2017-03-02 MED ORDER — ROCURONIUM BROMIDE 10 MG/ML (PF) SYRINGE
PREFILLED_SYRINGE | INTRAVENOUS | Status: DC | PRN
  Administered 2017-03-02: 50 mg via INTRAVENOUS

## 2017-03-02 SURGICAL SUPPLY — 57 items
BANDAGE ESMARK 6X9 LF (GAUZE/BANDAGES/DRESSINGS) ×1 IMPLANT
BLADE SURG 10 STRL SS (BLADE) ×1 IMPLANT
BNDG CMPR 9X6 STRL LF SNTH (GAUZE/BANDAGES/DRESSINGS) ×1
BNDG ESMARK 6X9 LF (GAUZE/BANDAGES/DRESSINGS) ×2
BOWL SMART MIX CTS (DISPOSABLE) ×2 IMPLANT
COVER SURGICAL LIGHT HANDLE (MISCELLANEOUS) ×2 IMPLANT
CUFF TOURNIQUET SINGLE 34IN LL (TOURNIQUET CUFF) ×1 IMPLANT
DRAPE EXTREMITY T 121X128X90 (DRAPE) ×1 IMPLANT
DRAPE HALF SHEET 40X57 (DRAPES) ×2 IMPLANT
DRAPE IMP U-DRAPE 54X76 (DRAPES) ×2 IMPLANT
DRAPE POUCH INSTRU U-SHP 10X18 (DRAPES) ×2 IMPLANT
DRAPE U-SHAPE 47X51 STRL (DRAPES) ×2 IMPLANT
DRSG AQUACEL AG ADV 3.5X10 (GAUZE/BANDAGES/DRESSINGS) ×1 IMPLANT
ELECT REM PT RETURN 9FT ADLT (ELECTROSURGICAL) ×2
ELECTRODE REM PT RTRN 9FT ADLT (ELECTROSURGICAL) ×1 IMPLANT
FACESHIELD WRAPAROUND (MASK) ×4 IMPLANT
FACESHIELD WRAPAROUND OR TEAM (MASK) ×1 IMPLANT
GAUZE XEROFORM 5X9 LF (GAUZE/BANDAGES/DRESSINGS) ×1 IMPLANT
GLOVE BIO SURGEON STRL SZ7.5 (GLOVE) ×1 IMPLANT
GLOVE BIO SURGEON STRL SZ8 (GLOVE) ×2 IMPLANT
GLOVE BIOGEL PI IND STRL 7.5 (GLOVE) IMPLANT
GLOVE BIOGEL PI IND STRL 8 (GLOVE) ×1 IMPLANT
GLOVE BIOGEL PI INDICATOR 7.5 (GLOVE) ×1
GLOVE BIOGEL PI INDICATOR 8 (GLOVE) ×1
GLOVE ECLIPSE 7.0 STRL STRAW (GLOVE) ×1 IMPLANT
GLOVE ORTHO TXT STRL SZ7.5 (GLOVE) ×2 IMPLANT
GOWN STRL REUS W/ TWL LRG LVL3 (GOWN DISPOSABLE) ×2 IMPLANT
GOWN STRL REUS W/ TWL XL LVL3 (GOWN DISPOSABLE) ×4 IMPLANT
GOWN STRL REUS W/TWL LRG LVL3 (GOWN DISPOSABLE) ×4
GOWN STRL REUS W/TWL XL LVL3 (GOWN DISPOSABLE) ×2
HANDPIECE INTERPULSE COAX TIP (DISPOSABLE) ×2
INSERT PFC SIG STB SZ3 15.0MM (Knees) ×1 IMPLANT
KIT BASIN OR (CUSTOM PROCEDURE TRAY) ×2 IMPLANT
KIT ROOM TURNOVER OR (KITS) ×2 IMPLANT
MANIFOLD NEPTUNE II (INSTRUMENTS) ×2 IMPLANT
NS IRRIG 1000ML POUR BTL (IV SOLUTION) ×2 IMPLANT
PACK TOTAL JOINT (CUSTOM PROCEDURE TRAY) ×2 IMPLANT
PACK UNIVERSAL I (CUSTOM PROCEDURE TRAY) ×2 IMPLANT
PAD ARMBOARD 7.5X6 YLW CONV (MISCELLANEOUS) ×4 IMPLANT
SET HNDPC FAN SPRY TIP SCT (DISPOSABLE) ×1 IMPLANT
SET PAD KNEE POSITIONER (MISCELLANEOUS) ×1 IMPLANT
STAPLER SKIN 35 WIDE (STAPLE) ×1 IMPLANT
STRIP CLOSURE SKIN 1/2X4 (GAUZE/BANDAGES/DRESSINGS) ×4 IMPLANT
SUCTION FRAZIER HANDLE 10FR (MISCELLANEOUS) ×1
SUCTION TUBE FRAZIER 10FR DISP (MISCELLANEOUS) ×1 IMPLANT
SUT MNCRL AB 4-0 PS2 18 (SUTURE) ×2 IMPLANT
SUT VIC AB 0 CT1 27 (SUTURE) ×2
SUT VIC AB 0 CT1 27XBRD ANBCTR (SUTURE) IMPLANT
SUT VIC AB 1 CT1 27 (SUTURE) ×6
SUT VIC AB 1 CT1 27XBRD ANBCTR (SUTURE) ×3 IMPLANT
SUT VIC AB 2-0 CT1 27 (SUTURE) ×8
SUT VIC AB 2-0 CT1 TAPERPNT 27 (SUTURE) ×3 IMPLANT
SYR 50ML SLIP (SYRINGE) ×2 IMPLANT
TOWEL OR 17X24 6PK STRL BLUE (TOWEL DISPOSABLE) ×2 IMPLANT
TOWEL OR 17X26 10 PK STRL BLUE (TOWEL DISPOSABLE) ×2 IMPLANT
TRAY FOLEY W/METER SILVER 16FR (SET/KITS/TRAYS/PACK) ×2 IMPLANT
WATER STERILE IRR 1000ML POUR (IV SOLUTION) ×2 IMPLANT

## 2017-03-02 NOTE — Anesthesia Procedure Notes (Signed)
Anesthesia Regional Block: Adductor canal block   Pre-Anesthetic Checklist: ,, timeout performed, Correct Patient, Correct Site, Correct Laterality, Correct Procedure, Correct Position, site marked, Risks and benefits discussed,  Surgical consent,  Pre-op evaluation,  At surgeon's request and post-op pain management  Laterality: Left  Prep: chloraprep       Needles:  Injection technique: Single-shot  Needle Type: Echogenic Needle     Needle Length: 9cm      Additional Needles:   Procedures: ultrasound guided,,,,,,,,  Narrative:  Start time: 03/02/2017 11:35 AM End time: 03/02/2017 11:42 AM Injection made incrementally with aspirations every 5 mL.  Performed by: Personally  Anesthesiologist: Greene Diodato  Additional Notes: Patient tolerated the procedure well without complications

## 2017-03-02 NOTE — H&P (Signed)
TOTAL KNEE REVISION ADMISSION H&P  Patient is being admitted for left revision total knee arthroplasty.  Subjective:  Chief Complaint:left knee pain.  HPI: [name], 71 y.o. female, has a history of pain and functional disability in the left knee(s) due to failed previous arthroplasty and patient has failed non-surgical conservative treatments for greater than 12 weeks to include NSAID's and/or analgesics, flexibility and strengthening excercises, supervised PT with diminished ADL's post treatment, use of assistive devices, weight reduction as appropriate and activity modification. The indications for the revision of the total knee arthroplasty are bearing surface wear leading to tibiofemoral instability. Onset of symptoms was gradual starting 5 years ago with gradually worsening course since that time.  Prior procedures on the left knee(s) include arthroplasty.  Patient currently rates pain in the left knee(s) at 10 out of 10 with activity. There is night pain, worsening of pain with activity and weight bearing, pain that interferes with activities of daily living, pain with passive range of motion and joint swelling.  Patient has evidence of no evidence of osteolysis or implant loosening by imaging studies. This condition presents safety issues increasing the risk of falls.  There is no current active infection.  Patient Active Problem List   Diagnosis Date Noted  . 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 Unstable angina ([place]) 05/21/2016  . Chronic diastolic heart failure ([place]) 03/29/2016  . Normal coronary arteries 2009 01/14/2015  . Obesity-BMI 40 01/14/2015  . Restless leg 01/14/2015  . Chest pain 01/14/2015  . Back pain 01/14/2015  . Renal insufficiency 01/14/2015  . Dementia- mild memory issues 01/14/2015  . Occult blood positive stool 12/14/2014  . Anemia 12/14/2014  . Family history of  colon cancer 12/14/2014  . Change in bowel habits 12/14/2014  . GERD (gastroesophageal reflux disease) 12/14/2014  . Dyspnea 05/17/2012  . Lymphedema 05/17/2012  . Weight gain 05/17/2012  . HTN (hypertension) 05/17/2012   Past Medical History:  Diagnosis Date  . Anemia   . Anxiety   . Arthritis   . Brain lesion    2 types  . Cervical spondylosis with myelopathy   . Chest pain    Normal cardiac cath 5/09  . Chronic kidney disease    stage III  . Congestive heart failure (CHF) ([place])   . Constipation   . Depression   . Diabetes mellitus without complication ([place])   . Dumping syndrome   . Dyspnea    with activity  . Edema   . Fatigue   . Fibromyalgia   . History of echocardiogram    a. Echo 03/20/16 (done at [facility] in [place], [place]):  mild LVH, EF 11%, normal diastolic function, mild LAE, MAC, RVSP 25 mmHg  . Hyperlipidemia   . Hypertension   . Hypothyroidism   . Lumbar spondylosis   . Lymphedema    seeing specialist for this  . Restless leg syndrome   . Rotator cuff injury   . Sleep apnea   . Stroke [facility])    TIAs "mini strokes"- unsure of last TIA  . Syncope   . Tremors of nervous system     Past Surgical History:  Procedure Laterality Date  . APPENDECTOMY  1966  . BACK SURGERY    . CARDIAC CATHETERIZATION N/A 05/25/2016   Procedure: Right/Left Heart Cath and Coronary Angiography;  Surgeon: Larey Dresser, MD;  Location: [facility] [facility];  Service: Cardiovascular;  Laterality: N/A;  .  CHOLECYSTECTOMY  2015  . CHOLECYSTECTOMY  2015  . COLONOSCOPY    . CORONARY ANGIOGRAM  2009   Normal coronaries  . Hip Bursa Surgery Bilateral   . KNEE ARTHROSCOPY Bilateral   . LUMBAR DISC SURGERY     x2  . LUMBAR FUSION    . RIGHT HEART CATH N/A 11/13/2016   Procedure: Right Heart Cath with Cardiomems;  Surgeon: Larey Dresser, MD;  Location: [place] [facility];  Service: Cardiovascular;  Laterality: N/A;  . ROTATOR CUFF REPAIR Bilateral   . TOTAL ABDOMINAL  HYSTERECTOMY    . TOTAL KNEE ARTHROPLASTY Left   . TOTAL KNEE ARTHROPLASTY Right 11/18/2012   Procedure: TOTAL KNEE ARTHROPLASTY;  Surgeon: Yvette Rack., MD;  Location: [facility];  Service: Orthopedics;  Laterality: Right;  . UPPER GI ENDOSCOPY      Prescriptions Prior to Admission  Medication Sig Dispense Refill Last Dose  . alendronate (FOSAMAX) 70 MG tablet Take 70 mg by mouth every Friday. Take with a full glass of water on an empty stomach.    Taking  . amLODipine (NORVASC) 10 MG tablet Take 1 tablet (10 mg total) by mouth daily. 30 tablet 6 03/02/2017 at Unknown time  . aspirin 81 MG tablet Take 81 mg by mouth at bedtime.   Past Week at Unknown time  . baclofen (LIORESAL) 10 MG tablet Take 10 mg by mouth 3 (three) times daily.   03/02/2017 at Unknown time  . clopidogrel (PLAVIX) 75 MG tablet TAKE 1 TABLET DAILY 30 tablet 6 Taking  . Cyanocobalamin (VITAMIN B-12 IJ) Inject as directed See admin instructions. Weekly for 4 weeks, has 1 more dose this week (week of 7/2) then will switch to once a month   Past Week at Unknown time  . donepezil (ARICEPT) 10 MG tablet Take 10 mg by mouth at bedtime.    03/01/2017 at Unknown time  . DULoxetine (CYMBALTA) 20 MG capsule Take 20 mg by mouth at bedtime.    03/01/2017 at Unknown time  . escitalopram (LEXAPRO) 20 MG tablet Take 30 mg by mouth daily with supper.    Taking  . fentaNYL (DURAGESIC - DOSED MCG/HR) 25 MCG/HR patch Place 25 mcg onto the skin every 3 (three) days.    03/01/2017 at Unknown time  . folic acid (FOLVITE) 1 MG tablet Take 1 mg by mouth 2 (two) times daily.   Taking  . gabapentin (NEURONTIN) 100 MG capsule Take 100 mg by mouth at bedtime.   03/01/2017 at Unknown time  . glipiZIDE (GLUCOTROL XL) 2.5 MG 24 hr tablet Take 2.5 mg by mouth daily.   Taking  . HYDROcodone-acetaminophen (NORCO) 7.5-325 MG tablet Take 1 tablet by mouth every 6 (six) hours as needed for moderate pain.   Taking  . lamoTRIgine (LAMICTAL) 100 MG tablet Take 100 mg by mouth  2 (two) times daily.    03/02/2017 at Unknown time  . levothyroxine (SYNTHROID, LEVOTHROID) 75 MCG tablet Take 75 mcg by mouth daily before breakfast.   03/01/2017 at Unknown time  . memantine (NAMENDA) 10 MG tablet Take 10 mg by mouth 2 (two) times daily.   Taking  . metolazone (ZAROXOLYN) 2.5 MG tablet Take 1 tablet (2.5 mg total) by mouth once a week. 15 tablet 3 03/02/2017 at Unknown time  . pravastatin (PRAVACHOL) 80 MG tablet Take 80 mg by mouth at bedtime.    03/01/2017 at Unknown time  . ranitidine (ZANTAC) 150 MG tablet Take 150 mg by mouth  2 (two) times daily.   03/02/2017 at Unknown time  . rOPINIRole (REQUIP) 1 MG tablet Take 1 mg by mouth 4 (four) times daily.    03/02/2017 at Unknown time  . sennosides-docusate sodium (SENOKOT-S) 8.6-50 MG tablet Take 2 tablets by mouth at bedtime.   03/01/2017 at Unknown time  . spironolactone (ALDACTONE) 25 MG tablet Take 25 mg by mouth 2 (two) times daily.   03/01/2017 at Unknown time  . torsemide (DEMADEX) 20 MG tablet Take 4 tablets (80 mg total) by mouth daily. 90 tablet 3 03/01/2017 at Unknown time  . Turmeric 500 MG CAPS Take 500 mg by mouth daily.    Taking   Allergies  Allergen Reactions  . Hydromorphone Other (See Comments)    Hallucinations, altered mental state    Social History  Substance Use Topics  . Smoking status: Never Smoker  . Smokeless tobacco: Never Used  . Alcohol use No    Family History  Problem Relation Age of Onset  . Heart disease Mother   . Heart failure Mother   . Kidney disease Mother   . Lung cancer Father   . Colon cancer Brother   . Cancer Brother        malignant thymoma  . Heart disease Brother   . Tremor Maternal Grandmother 90  . Ovarian cancer Maternal Grandmother   . Uterine cancer Maternal Grandmother   . Irritable bowel syndrome Daughter   . Esophageal cancer Neg Hx   . Stomach cancer Neg Hx   . Rectal cancer Neg Hx       Review of Systems  Musculoskeletal: Positive for joint pain.  All other  systems reviewed and are negative.    Objective:  Physical Exam  Constitutional: She is oriented to person, place, and time. She appears well-developed and well-nourished.  HENT:  Head: Normocephalic and atraumatic.  Eyes: Pupils are equal, round, and reactive to light.  Neck: Normal range of motion.  Cardiovascular: Normal rate.   Respiratory: Effort normal.  GI: Soft.  Musculoskeletal:       Left knee: She exhibits LCL laxity and MCL laxity.  Neurological: She is alert and oriented to person, place, and time.  Skin: Skin is warm and dry.  Psychiatric: She has a normal mood and affect.    Vital signs in last 24 hours: Temp:  [98.1 F (36.7 C)] 98.1 F (36.7 C) (07/10 1020) Pulse Rate:  [65] 65 (07/10 1020) Resp:  [20] 20 (07/10 1020) BP: (100)/(48) 100/48 (07/10 1020) SpO2:  [97 %] 97 % (07/10 1020) Weight:  [257 lb (116.6 kg)] 257 lb (116.6 kg) (07/10 1020)  Labs:  Estimated body mass index is 41.48 kg/m as calculated from the following:   Height as of 02/23/17: _0  (1.676 m).   Weight as of this encounter: 257 lb (116.6 kg).  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  Risks and benefits have been discussed in detail and informed consent is obtained.

## 2017-03-02 NOTE — Transfer of Care (Signed)
Immediate Anesthesia Transfer of Care Note  Patient: VERGIE ZAHM  Procedure(s) Performed: Procedure(s): Left knee Revision of poly liner (Left)  Patient Location: PACU  Anesthesia Type:GA combined with regional for post-op pain  Level of Consciousness: awake, alert  and oriented  Airway & Oxygen Therapy: Patient Spontanous Breathing and Patient connected to face mask oxygen  Post-op Assessment: Report given to RN and Post -op Vital signs reviewed and stable  Post vital signs: Reviewed and stable  Last Vitals:  Vitals:   03/02/17 1020  BP: (!) 100/48  Pulse: 65  Resp: 20  Temp: 36.7 C    Last Pain:  Vitals:   03/02/17 1020  TempSrc: Oral      Patients Stated Pain Goal: 3 (25/49/82 6415)  Complications: No apparent anesthesia complications

## 2017-03-02 NOTE — Op Note (Deleted)
  The note originally documented on this encounter has been moved the the encounter in which it belongs.  

## 2017-03-02 NOTE — Op Note (Signed)
NAMEBARRETT, HOLTHAUS              ACCOUNT NO.:  [id number]  MEDICAL RECORD NO.:  [id number]  LOCATION:                               FACILITY:  [facility]  PHYSICIAN:  Lind Guest. Ninfa Linden, M.D.DATE OF BIRTH:  1945-09-13  DATE OF PROCEDURE:  03/02/2017 DATE OF DISCHARGE:                              OPERATIVE REPORT   PREOPERATIVE DIAGNOSIS:  Polyethylene liner wear, left total knee arthroplasty.  POSTOPERATIVE DIAGNOSIS:  Polyethylene liner wear, left total knee arthroplasty.  PROCEDURE:  Left knee arthrotomy with partial synovectomy and polyethylene liner upsizing and exchange.  FINDINGS:  Diffuse synovitis of left knee with no evidence of prosthetic component loosening with the need for upsizing the polyethylene liner.  IMPLANTS:  DePuy 50 mm thickness size 3 rotating platform polyethylene liner placement.  SURGEON:  Lind Guest. Ninfa Linden, M.D.  ASSISTANT:  Erskine Emery, PA-C.  ANESTHESIA:  General.  ANTIBIOTICS:  2 g of IV Ancef.  TOURNIQUET TIME:  Under 1 hour.  BLOOD LOSS:  Less than 100 mL.  COMPLICATIONS:  None.  INDICATIONS:  Ms. [name] is a 71 year old obese female, who had a left total knee arthroplasty done by another surgeon in town in 2006.  She eventually had her right knee replaced by a different surgeon in town other than the surgeon for the left knee.  Over time her left knee has hurt her quite a bit.  It has become unstable for her.  She has had a complete workup to make sure that the implants were not loose and there was no evidence of infection.  On examination, her knee has instability with varus and valgus stressing with a positive drawer sign.  Her operative note says that she had a 10 mm polyethylene liner placed.  I have recommended a polyethylene liner exchange given a possible no loosening of the metal components and upsizing to a thicker poly to give her more stability.  We had a long and thorough discussion about the risks and  benefits of surgery, and she does wish to proceed.  PROCEDURE DESCRIPTION:  After informed consent was obtained, appropriate left knee was marked.  She was brought to the operating room, placed supine on the operating table.  General anesthesia was then obtained.  A nonsterile tourniquet was placed around her upper left thigh and a Foley catheter was placed.  Her left leg was prepped and draped from the thigh down the toes with DuraPrep and sterile drapes including a sterile stockinette.  Time-out was called and she was identified as correct patient and correct left knee.  We then used an Esmarch to wrap out the leg and tourniquet was inflated to 300 mm of pressure.  I then made a direct midline incision over the patella, and through her previous incision, carried this proximally and distally.  We dissected down to the knee joint and carried out a medial parapatellar arthrotomy and found no significant effusion at all, but we did find evidence of metallosis and synovitis consistent with poly liner wear.  We were able to easily remove the synovium from the knee for partial synovectomy in the medial and lateral gutters in the suprapatellar space.  I was able to easily move  the old polyethylene liner and confirmed it was a 10 mm thickness polyethylene liner for a size 3 tibial component.  We then was able to get to the back of the knee and performed a partial synovectomy back there.  We then irrigated the knee with normal saline solution using 3 L normal saline solution.  I assessed the tibial and femoral components and did not find them loose.  I then trialed a 15 mm polyethylene insert and this gave her significant stability with no instability to varus and valgus stress and negative drawer sign.  We then went with the real 50 mm thickness size 3 polyethylene insert and put this in the knee easily.  We then closed the arthrotomy with interrupted #1 Vicryl suture followed by 0 Vicryl in the  deep tissue, 2- 0 Vicryl in subcutaneous tissue, interrupted staples on the skin. Xeroform and Aquacel dressings were applied.  She was awakened, extubated, and taken to the recovery room in stable condition.  All final counts were correct.  There were no complications noted.  Of note, Erskine Emery, PA-C assisted in the entire case.  His assistance was crucial for facilitating all aspects of this case.     Lind Guest. Ninfa Linden, M.D.   ______________________________ Lind Guest. Ninfa Linden, M.D.    CYB/MEDQ  D:  03/02/2017  T:  03/02/2017  Job:  [id number]

## 2017-03-02 NOTE — Brief Op Note (Signed)
03/02/2017  1:33 PM  PATIENT:  [name]  71 y.o. female  PRE-OPERATIVE DIAGNOSIS:  left knee poly liner wear  POST-OPERATIVE DIAGNOSIS:  left knee poly liner wear  PROCEDURE:  Procedure(s): Left knee Revision of poly liner (Left)  SURGEON:  Surgeon(s) and Role:    Mcarthur Rossetti, MD - Primary  PHYSICIAN ASSISTANT: Benita Stabile, PA-C  ANESTHESIA:   general  EBL:  Total I/O In: -  Out: 400 [Urine:350; Blood:50]  COUNTS:  YES  TOURNIQUET:   Total Tourniquet Time Documented: Thigh (Left) - 31 minutes Total: Thigh (Left) - 31 minutes   DICTATION: .Other Dictation: Dictation Number [id number]  PLAN OF CARE: Admit to inpatient   PATIENT DISPOSITION:  PACU - hemodynamically stable.   Delay start of Pharmacological VTE agent (>24hrs) due to surgical blood loss or risk of bleeding: no

## 2017-03-02 NOTE — Anesthesia Preprocedure Evaluation (Signed)
Anesthesia Evaluation  Patient identified by MRN, date of birth, ID band Patient awake    Reviewed: Allergy & Precautions, NPO status , Patient's Chart, lab work & pertinent test results  Airway Mallampati: II  TM Distance: >3 FB Neck ROM: Full    Dental no notable dental hx.    Pulmonary sleep apnea ,    Pulmonary exam normal breath sounds clear to auscultation       Cardiovascular hypertension, Normal cardiovascular exam Rhythm:Regular Rate:Normal     Neuro/Psych Anxiety TIAnegative psych ROS   GI/Hepatic negative GI ROS, Neg liver ROS,   Endo/Other  diabetesHypothyroidism   Renal/GU negative Renal ROS  negative genitourinary   Musculoskeletal negative musculoskeletal ROS (+)   Abdominal   Peds negative pediatric ROS (+)  Hematology negative hematology ROS (+)   Anesthesia Other Findings   Reproductive/Obstetrics negative OB ROS                             Anesthesia Physical Anesthesia Plan  ASA: III  Anesthesia Plan: General   Post-op Pain Management:  Regional for Post-op pain   Induction: Intravenous  PONV Risk Score and Plan: 1 and Ondansetron and Dexamethasone  Airway Management Planned: LMA and Oral ETT  Additional Equipment:   Intra-op Plan:   Post-operative Plan: Extubation in OR  Informed Consent: I have reviewed the patients History and Physical, chart, labs and discussed the procedure including the risks, benefits and alternatives for the proposed anesthesia with the patient or authorized representative who has indicated his/her understanding and acceptance.   Dental advisory given  Plan Discussed with: CRNA  Anesthesia Plan Comments: (GA secondary to spinal fusion)        Anesthesia Quick Evaluation

## 2017-03-02 NOTE — Anesthesia Procedure Notes (Signed)
Procedure Name: Intubation Date/Time: 03/02/2017 12:28 PM Performed by: Teressa Lower Pre-anesthesia Checklist: Patient identified, Emergency Drugs available, Suction available and Patient being monitored Patient Re-evaluated:Patient Re-evaluated prior to inductionOxygen Delivery Method: Circle system utilized Preoxygenation: Pre-oxygenation with 100% oxygen Intubation Type: IV induction Ventilation: Mask ventilation without difficulty Laryngoscope Size: Mac and 3 Grade View: Grade I Tube type: Oral Tube size: 7.0 mm Number of attempts: 1 Airway Equipment and Method: Stylet and Oral airway Placement Confirmation: ETT inserted through vocal cords under direct vision,  positive ETCO2 and breath sounds checked- equal and bilateral Secured at: 21 cm Tube secured with: Tape Dental Injury: Teeth and Oropharynx as per pre-operative assessment

## 2017-03-02 NOTE — Anesthesia Procedure Notes (Signed)
Anesthesia Procedure Image    

## 2017-03-02 NOTE — Anesthesia Postprocedure Evaluation (Signed)
Anesthesia Post Note  Patient: [name]  Procedure(s) Performed: Procedure(s) (LRB): Left knee Revision of poly liner (Left)     Patient location during evaluation: PACU Anesthesia Type: General Level of consciousness: awake and alert Pain management: pain level controlled Vital Signs Assessment: post-procedure vital signs reviewed and stable Respiratory status: spontaneous breathing, nonlabored ventilation, respiratory function stable and patient connected to nasal cannula oxygen Cardiovascular status: blood pressure returned to baseline and stable Postop Assessment: no signs of nausea or vomiting Anesthetic complications: no    Last Vitals:  Vitals:   03/02/17 1448 03/02/17 1500  BP: (!) 130/48   Pulse: 65 63  Resp: 13 13  Temp:      Last Pain:  Vitals:   03/02/17 1500  TempSrc:   PainSc: Asleep                 [name]

## 2017-03-03 ENCOUNTER — Encounter (HOSPITAL_COMMUNITY): Payer: Self-pay

## 2017-03-03 DIAGNOSIS — T84062A Wear of articular bearing surface of internal prosthetic right knee joint, initial encounter: Secondary | ICD-10-CM

## 2017-03-03 DIAGNOSIS — Z96652 Presence of left artificial knee joint: Secondary | ICD-10-CM

## 2017-03-03 LAB — GLUCOSE, CAPILLARY
GLUCOSE-CAPILLARY: 121 mg/dL — AB (ref 65–99)
GLUCOSE-CAPILLARY: 93 mg/dL (ref 65–99)
GLUCOSE-CAPILLARY: 83 mg/dL (ref 65–99)

## 2017-03-03 NOTE — Clinical Social Work Note (Signed)
Clinical Social Work Assessment  Patient Details  Name: [name] MRN: [id number] Date of Birth: Jan 17, 1946  Date of referral:  03/03/17               Reason for consult:  Facility Placement                Permission sought to share information with:    Permission granted to share information::  Yes, Verbal Permission Granted  Name::        Agency::  SNF  Relationship::     Contact Information:     Housing/Transportation Living arrangements for the past 2 months:  Single Family Home Source of Information:  Patient Patient Interpreter Needed:  None Criminal Activity/Legal Involvement Pertinent to Current Situation/Hospitalization:  No - Comment as needed Significant Relationships:  Adult Children, Other Family Members Lives with:  Self Do you feel safe going back to the place where you live?  No Need for family participation in patient care:  Yes (Comment)  Care giving concerns:  Patient resided at home alone prior to hospitalization. She used a walker in home. She has family close by however they cannot care for her after DC. Pt not safe to return home.  Social Worker assessment / plan:  CSW met with patient to discuss recommendations at DC to receive short term rehabilitation. CSW introduced herself and explained her role in DC. CSW explained SNF placement and options. CSW obtained permission to send out offers to local SNF's.  FL2 pending PT. Passr obtained. SNF's pending.  Employment status:  Retired Forensic scientist:    PT Recommendations:  [place] / Referral to community resources:  [place]  Patient/Family's Response to care:  Patient appreciative of CSW coming to speak with her. No issues or concerns at this time.  Patient/Family's Understanding of and Emotional Response to Diagnosis, Current Treatment, and Prognosis:  Patient has good understanding of diagnosis, current treatment and prognosis. Pt hopeful that short  term rehab will address impairment. No issues or concerns at this time.  Emotional Assessment Appearance:  Appears stated age Attitude/Demeanor/Rapport:   (Cooperative) Affect (typically observed):  Accepting, Appropriate Orientation:  Oriented to Self, Oriented to Place, Oriented to  Time, Oriented to Situation Alcohol / Substance use:  Not Applicable Psych involvement (Current and /or in the community):  No (Comment)  Discharge Needs  Concerns to be addressed:  Care Coordination Readmission within the last 30 days:  Yes Current discharge risk:  Dependent with Mobility, Cognitively Impaired Barriers to Discharge:  No Barriers Identified   [name], LCSW 03/03/2017, 2:47 PM

## 2017-03-03 NOTE — Progress Notes (Signed)
Subjective: 1 Day Post-Op Procedure(s) (LRB): Left knee Revision of poly liner (Left) Patient reports pain as moderate.    Objective: Vital signs in last 24 hours: Temp:  [97.6 F (36.4 C)-98.5 F (36.9 C)] 98.4 F (36.9 C) (07/11 0300) Pulse Rate:  [56-79] 60 (07/11 0300) Resp:  [10-20] 16 (07/11 0300) BP: (100-144)/(42-67) 110/42 (07/11 0300) SpO2:  [94 %-100 %] 100 % (07/11 0300) Weight:  [254 lb (115.2 kg)-257 lb (116.6 kg)] 254 lb (115.2 kg) (07/11 0058)  Intake/Output from previous day: 07/10 0701 - 07/11 0700 In: 1277.5 [I.V.:1277.5] Out: 2175 [Urine:2125; Blood:50] Intake/Output this shift: No intake/output data recorded.  No results for input(s): HGB in the last 72 hours. No results for input(s): WBC, RBC, HCT, PLT in the last 72 hours. No results for input(s): NA, K, CL, CO2, BUN, CREATININE, GLUCOSE, CALCIUM in the last 72 hours. No results for input(s): LABPT, INR in the last 72 hours.  Sensation intact distally Intact pulses distally Dorsiflexion/Plantar flexion intact Incision: moderate drainage No cellulitis present Compartment soft  Assessment/Plan: 1 Day Post-Op Procedure(s) (LRB): Left knee Revision of poly liner (Left) Up with therapy Discharge to SNF in the next 1-2 days due to being home alone.  [name] 03/03/2017, 7:27 AM

## 2017-03-03 NOTE — Evaluation (Signed)
Physical Therapy Evaluation Patient Details Name: [name] MRN: [id number] DOB: 10-21-1945 Today's Date: 03/03/2017   History of Present Illness  Pt presents for L knee polyethylene liner exchange after TKA. PMH CAD, h/o colon cancer, DM, CHF, HTN, anxiety, R TKA, multiple shouder surgeries.    Clinical Impression  Pt admitted with above diagnosis. Pt currently with functional limitations due to the deficits listed below (see PT Problem List). Pt required mod A for mobility today, pivoted bed to [facility] and BSC to recliner with RW. Pain limiting today, pt has RLS which increases her pain and had not yet received her morning meds for it.  Pt will benefit from skilled PT to increase their independence and safety with mobility to allow discharge to the venue listed below.       Follow Up Recommendations SNF;Supervision/Assistance - 24 hour;DC plan and follow up therapy as arranged by surgeon    Equipment Recommendations  None recommended by PT    Recommendations for Other Services       Precautions / Restrictions Precautions Precautions: Knee Precaution Booklet Issued: Yes (comment) (ther ex sheet) Precaution Comments: reviewed proper positioning of L knee Restrictions Weight Bearing Restrictions: Yes LLE Weight Bearing: Weight bearing as tolerated      Mobility  Bed Mobility Overal bed mobility: Needs Assistance Bed Mobility: Supine to Sit     Supine to sit: Mod assist     General bed mobility comments: mod A for LLE off bed. Pt pivots self around but then has difficulty sitting straight up EOB, discussed rolling first for decreased stress on back. mod A to scoot to EOB  Transfers Overall transfer level: Needs assistance Equipment used: Rolling walker (2 wheeled) Transfers: Sit to/from [facility] Sit to Stand: Mod assist Stand pivot transfers: Mod assist       General transfer comment: mod A for first stand from bed, second time min from [facility],  increased time needed each time and needs support while she backs feet up under her. Mod A for instructional cues with RW and stability during pivot to [facility] then recliner.   Ambulation/Gait             General Gait Details: not tolerating more than pivot yet  Stairs            Wheelchair Mobility    Modified Rankin (Stroke Patients Only)       Balance Overall balance assessment: Needs assistance Sitting-balance support: No upper extremity supported Sitting balance-Leahy Scale: Good     Standing balance support: Bilateral upper extremity supported Standing balance-Leahy Scale: Poor Standing balance comment: unable to stand without UE support                             Pertinent Vitals/Pain Pain Assessment: 0-10 Pain Score: 5  Pain Location: left knee Pain Descriptors / Indicators: Cramping;Aching Pain Intervention(s): Limited activity within patient's tolerance;Monitored during session;Patient requesting pain meds-RN notified    Home Living Family/patient expects to be discharged to:: Skilled nursing facility Living Arrangements: Alone                    Prior Function Level of Independence: Independent with assistive device(s)         Comments: had gotten to the point that she needed RW all of the time and was struggling through ADL's due to pain     Hand Dominance  Extremity/Trunk Assessment   Upper Extremity Assessment Upper Extremity Assessment: Overall WFL for tasks assessed    Lower Extremity Assessment Lower Extremity Assessment: Generalized weakness;LLE deficits/detail LLE Deficits / Details: hip flex 2-/5, knee ext 3-/5, bilateral ankles very swollen LLE Coordination: decreased fine motor;decreased gross motor    Cervical / Trunk Assessment Cervical / Trunk Assessment: Normal  Communication   Communication: No difficulties  Cognition Arousal/Alertness: Awake/alert Behavior During Therapy: WFL for tasks  assessed/performed Overall Cognitive Status: Within Functional Limits for tasks assessed                                        General Comments General comments (skin integrity, edema, etc.): O2 sats 96% on RA after session, O2 left off but beside pt    Exercises Total Joint Exercises Ankle Circles/Pumps: AROM;Both;10 reps;Seated Quad Sets: AROM;Both;10 reps;Seated Heel Slides: AROM;Left;5 reps;Supine Goniometric ROM: 10-70   Assessment/Plan    PT Assessment Patient needs continued PT services  PT Problem List Decreased strength;Decreased range of motion;Decreased balance;Decreased mobility;Decreased knowledge of precautions;Pain;Obesity       PT Treatment Interventions DME instruction;Gait training;Functional mobility training;Therapeutic activities;Therapeutic exercise;Balance training;Patient/family education    PT Goals (Current goals can be found in the Care Plan section)  Acute Rehab PT Goals Patient Stated Goal: rehab then home PT Goal Formulation: With patient Time For Goal Achievement: 03/10/17 Potential to Achieve Goals: Good    Frequency Min 5X/week   Barriers to discharge Decreased caregiver support lives alone    Co-evaluation               AM-PAC PT "6 Clicks" Daily Activity  Outcome Measure Difficulty turning over in bed (including adjusting bedclothes, sheets and blankets)?: Total Difficulty moving from lying on back to sitting on the side of the bed? : Total Difficulty sitting down on and standing up from a chair with arms (e.g., wheelchair, bedside commode, etc,.)?: Total Help needed moving to and from a bed to chair (including a wheelchair)?: A Lot Help needed walking in hospital room?: A Lot Help needed climbing 3-5 steps with a railing? : Total 6 Click Score: 8    End of Session Equipment Utilized During Treatment: Gait belt   Patient left: in chair;with call bell/phone within reach;with chair alarm set Nurse  Communication: Mobility status PT Visit Diagnosis: Unsteadiness on feet (R26.81);Muscle weakness (generalized) (M62.81);Difficulty in walking, not elsewhere classified (R26.2)    Time: 6468-0321 PT Time Calculation (min) (ACUTE ONLY): 41 min   Charges:   PT Evaluation $PT Eval Moderate Complexity: 1 Procedure PT Treatments $Gait Training: 8-22 mins $Therapeutic Activity: 8-22 mins   PT G Codes:        Leighton Roach, PT  Acute Rehab Services  [place] 03/03/2017, 9:44 AM

## 2017-03-04 LAB — GLUCOSE, CAPILLARY
Glucose-Capillary: 91 mg/dL (ref 65–99)
Glucose-Capillary: 90 mg/dL (ref 65–99)
Glucose-Capillary: 92 mg/dL (ref 65–99)

## 2017-03-04 MED ORDER — BISACODYL 10 MG RE SUPP: 10.0000 mg | Freq: Every day | RECTAL | Status: DC | PRN

## 2017-03-04 MED ORDER — OXYCODONE-ACETAMINOPHEN 5-325 MG PO TABS: 1.0000 | ORAL_TABLET | ORAL | 0 refills | Status: DC | PRN

## 2017-03-04 MED ORDER — POLYETHYLENE GLYCOL 3350 17 G PO PACK
17.0000 g | PACK | Freq: Every day | ORAL | Status: DC
  Administered 2017-03-04 – 2017-03-05 (×2): 17 g via ORAL
  Filled 2017-03-04 (×2): qty 1

## 2017-03-04 MED ORDER — DOCUSATE SODIUM 100 MG PO CAPS
100.0000 mg | ORAL_CAPSULE | Freq: Two times a day (BID) | ORAL | Status: DC
  Administered 2017-03-04 – 2017-03-05 (×3): 100 mg via ORAL
  Filled 2017-03-04 (×3): qty 1

## 2017-03-04 MED ORDER — BISACODYL 5 MG PO TBEC
5.0000 mg | DELAYED_RELEASE_TABLET | Freq: Every day | ORAL | Status: DC | PRN
  Administered 2017-03-04 – 2017-03-05 (×2): 5 mg via ORAL
  Filled 2017-03-04 (×2): qty 1

## 2017-03-04 MED ORDER — ASPIRIN 325 MG PO TBEC: 325.0000 mg | DELAYED_RELEASE_TABLET | Freq: Two times a day (BID) | ORAL | 0 refills | Status: DC

## 2017-03-04 NOTE — Progress Notes (Signed)
Subjective: 2 Days Post-Op Procedure(s) (LRB): Left knee Revision of poly liner (Left) Patient reports pain as moderate.   Voices she would like to go home tomorrow instead of going to SNF. Objective: Vital signs in last 24 hours: Temp:  [97.9 F (36.6 C)-98.6 F (37 C)] (P) 97.9 F (36.6 C) (07/12 0947) Pulse Rate:  [67-70] 70 (07/12 0947) Resp:  [16-18] 18 (07/12 0947) BP: (107-128)/(37-48) 124/48 (07/12 0947) SpO2:  [94 %-98 %] 94 % (07/12 0947)  Intake/Output from previous day: 07/11 0701 - 07/12 0700 In: 825 [I.V.:825] Out: 1400 [Urine:1400] Intake/Output this shift: No intake/output data recorded.  No results for input(s): HGB in the last 72 hours. No results for input(s): WBC, RBC, HCT, PLT in the last 72 hours. No results for input(s): NA, K, CL, CO2, BUN, CREATININE, GLUCOSE, CALCIUM in the last 72 hours. No results for input(s): LABPT, INR in the last 72 hours.  Sensation intact distally Intact pulses distally Incision: scant drainage Compartment soft  Assessment/Plan: 2 Days Post-Op Procedure(s) (LRB): Left knee Revision of poly liner (Left) Up with therapy  Dressing changed today. Will see how she does with PT and plan for discharge to home or SNF tomorrow based on progress.   [name] 03/04/2017, 9:52 AM

## 2017-03-04 NOTE — Progress Notes (Signed)
Physical Therapy Treatment Patient Details Name: [name] MRN: [id number] DOB: 1946-05-07 Today's Date: 03/04/2017    History of Present Illness Pt presents for L knee polyethylene liner exchange after TKA. PMH CAD, h/o colon cancer, DM, CHF, HTN, anxiety, R TKA, multiple shouder surgeries.      PT Comments    Pt reports she would like to get back to her home, but agrees to go to SNF short term to get stronger and more mobile prior to return home after PT education. Pt able to take several steps from bed and back to chair this am; however quite fatigued afterwards. Pt noticed to have more Lt knee flexion and was positioned with pillow under ankle to promote knee extension. Will continue to follow acutely for increased mobility and activity tolerance, as well as strengthening.    Follow Up Recommendations  SNF;Supervision/Assistance - 24 hour     Equipment Recommendations  None recommended by PT    Recommendations for Other Services       Precautions / Restrictions Precautions Precautions: Knee;Fall Precaution Comments: reviewed proper positioning of L knee Restrictions Weight Bearing Restrictions: Yes LLE Weight Bearing: Weight bearing as tolerated    Mobility  Bed Mobility Overal bed mobility: Needs Assistance Bed Mobility: Supine to Sit     Supine to sit: Supervision     General bed mobility comments: increased time and HOB 30 degrees with pt utilizing rail  Transfers Overall transfer level: Needs assistance   Transfers: Sit to/from Stand Sit to Stand: Mod assist         General transfer comment: cues for hand placement and sequence, assist to rise  Ambulation/Gait Ambulation/Gait assistance: Min assist Ambulation Distance (Feet): 10 Feet Assistive device: Rolling walker (2 wheeled) Gait Pattern/deviations: Shuffle;Trunk flexed;Decreased stance time - left   Gait velocity interpretation: Below normal speed for age/gender General Gait Details: cues  for sequence and progression   Stairs            Wheelchair Mobility    Modified Rankin (Stroke Patients Only)       Balance Overall balance assessment: Needs assistance Sitting-balance support: Bilateral upper extremity supported Sitting balance-Leahy Scale: Good     Standing balance support: Bilateral upper extremity supported;During functional activity Standing balance-Leahy Scale: Poor Standing balance comment: unable to stand without UE support                            Cognition Arousal/Alertness: Awake/alert Behavior During Therapy: WFL for tasks assessed/performed Overall Cognitive Status: Within Functional Limits for tasks assessed                                        Exercises General Exercises - Lower Extremity Quad Sets: AROM;Seated;Left;10 reps Long Arc Quad: AAROM;Left;Seated;10 reps Heel Slides: AAROM;Left;Seated;10 reps Hip ABduction/ADduction: AROM;Left;Seated;15 reps    General Comments        Pertinent Vitals/Pain Pain Assessment: 0-10 Pain Score: 5  Pain Location: left knee Pain Descriptors / Indicators: Aching;Sore Pain Intervention(s): Limited activity within patient's tolerance;Repositioned;Premedicated before session;Monitored during session    Home Living                      Prior Function            PT Goals (current goals can now be found in the care plan section)  Progress towards PT goals: Progressing toward goals    Frequency    Min 5X/week      PT Plan Current plan remains appropriate    Co-evaluation              AM-PAC PT "6 Clicks" Daily Activity  Outcome Measure  Difficulty turning over in bed (including adjusting bedclothes, sheets and blankets)?: Total Difficulty moving from lying on back to sitting on the side of the bed? : Total Difficulty sitting down on and standing up from a chair with arms (e.g., wheelchair, bedside commode, etc,.)?: Total Help needed  moving to and from a bed to chair (including a wheelchair)?: A Lot Help needed walking in hospital room?: A Lot Help needed climbing 3-5 steps with a railing? : Total 6 Click Score: 8    End of Session Equipment Utilized During Treatment: Gait belt Activity Tolerance: Patient tolerated treatment well Patient left: in chair;with call bell/phone within reach;with chair alarm set Nurse Communication: Mobility status;Precautions PT Visit Diagnosis: Unsteadiness on feet (R26.81);Muscle weakness (generalized) (M62.81);Difficulty in walking, not elsewhere classified (R26.2);Pain Pain - Right/Left: Left Pain - part of body: Knee     Time: 0827-0851 PT Time Calculation (min) (ACUTE ONLY): 24 min  Charges:  $Therapeutic Exercise: 8-22 mins $Therapeutic Activity: 8-22 mins                    G Codes:       Elberta Leatherwood, SPT Acute Rehab [place] 03/04/2017, 9:41 AM

## 2017-03-04 NOTE — Discharge Instructions (Signed)

## 2017-03-04 NOTE — NC FL2 (Signed)
[place] MEDICAID FL2 LEVEL OF CARE SCREENING TOOL     IDENTIFICATION  Patient Name: [name] Birthdate: [birth year] Sex: female Admission Date (Current Location): 03/02/2017  [facility] and [place] Number:  Herbalist and Address:  The [facility] [place] [address], [place], [place] [address]      Provider Number: [id number]  Attending Physician Name and Address:  Mcarthur Rossetti  Relative Name and Phone Number:       Current Level of Care: Hospital Recommended Level of Care: [place] Prior Approval Number:    Date Approved/Denied: 03/04/17 PASRR Number: [id number]  Discharge Plan: SNF    Current Diagnoses: Patient Active Problem List   Diagnosis Date Noted  . 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 Unstable angina ([place]) 05/21/2016  . Chronic diastolic heart failure ([place]) 03/29/2016  . Normal coronary arteries 2009 01/14/2015  . Obesity-BMI 40 01/14/2015  . Restless leg 01/14/2015  . Chest pain 01/14/2015  . Back pain 01/14/2015  . Renal insufficiency 01/14/2015  . Dementia- mild memory issues 01/14/2015  . Occult blood positive stool 12/14/2014  . Anemia 12/14/2014  . Family history of colon cancer 12/14/2014  . Change in bowel habits 12/14/2014  . GERD (gastroesophageal reflux disease) 12/14/2014  . Dyspnea 05/17/2012  . Lymphedema 05/17/2012  . Weight gain 05/17/2012  . HTN (hypertension) 05/17/2012    Orientation RESPIRATION BLADDER Height & Weight     Self, Time, Situation, Place  Normal Incontinent, Indwelling catheter Weight: 254 lb (115.2 kg) Height:  5\' 6"  (167.6 cm)  BEHAVIORAL SYMPTOMS/MOOD NEUROLOGICAL BOWEL NUTRITION STATUS      Continent Diet (See DC Summary)  AMBULATORY STATUS COMMUNICATION OF NEEDS Skin   Limited Assist  Verbally Surgical wounds (Closed Knee Incision  Compression Wrap)                       Personal Care Assistance Level of Assistance  Bathing, Feeding, Dressing Bathing Assistance: Limited assistance Feeding assistance: Independent Dressing Assistance: Limited assistance     Functional Limitations Info             SPECIAL CARE FACTORS FREQUENCY  PT (By licensed PT), OT (By licensed OT)     PT Frequency: 5xweek OT Frequency: 5xweek            Contractures      Additional Factors Info  Code Status, Allergies Code Status Info: Full Code Allergies Info: HYDROMORPHONE            Current Medications (03/04/2017):  This is the current hospital active medication list Current Facility-Administered Medications  Medication Dose Route Frequency Provider Last Rate Last Dose  . 0.9 %  sodium chloride infusion   Intravenous Continuous Mcarthur Rossetti, MD 75 mL/hr at 03/02/17 1838    . acetaminophen (TYLENOL) tablet 650 mg  650 mg Oral Q6H PRN Mcarthur Rossetti, MD   650 mg at 03/04/17 4650   Or  . acetaminophen (TYLENOL) suppository 650 mg  650 mg Rectal Q6H PRN Mcarthur Rossetti, MD      . amLODipine (NORVASC) tablet 10 mg  10 mg Oral Daily Mcarthur Rossetti, MD   10 mg at 03/04/17 0956  . aspirin EC tablet 325 mg  325 mg Oral BID PC Mcarthur Rossetti, MD   325 mg at 03/04/17  4235  . baclofen (LIORESAL) tablet 10 mg  10 mg Oral TID Mcarthur Rossetti, MD   10 mg at 03/04/17 0957  . bisacodyl (DULCOLAX) EC tablet 5 mg  5 mg Oral Daily PRN Mcarthur Rossetti, MD       Or  . bisacodyl (DULCOLAX) suppository 10 mg  10 mg Rectal Daily PRN Mcarthur Rossetti, MD      . diphenhydrAMINE (BENADRYL) 12.5 MG/5ML elixir 12.5-25 mg  12.5-25 mg Oral Q4H PRN Mcarthur Rossetti, MD      . docusate sodium (COLACE) capsule 100 mg  100 mg Oral BID Mcarthur Rossetti, MD   100 mg at 03/04/17 1344  . donepezil (ARICEPT) tablet 10 mg  10  mg Oral QHS Mcarthur Rossetti, MD   10 mg at 03/03/17 2113  . DULoxetine (CYMBALTA) DR capsule 20 mg  20 mg Oral QHS Mcarthur Rossetti, MD   20 mg at 03/03/17 2113  . escitalopram (LEXAPRO) tablet 30 mg  30 mg Oral Q supper Mcarthur Rossetti, MD   30 mg at 03/03/17 1828  . famotidine (PEPCID) tablet 20 mg  20 mg Oral BID Mcarthur Rossetti, MD   20 mg at 03/04/17 0957  . folic acid (FOLVITE) tablet 1 mg  1 mg Oral BID Mcarthur Rossetti, MD   1 mg at 03/04/17 0957  . gabapentin (NEURONTIN) capsule 100 mg  100 mg Oral QHS Mcarthur Rossetti, MD   100 mg at 03/03/17 2113  . glipiZIDE (GLUCOTROL XL) 24 hr tablet 2.5 mg  2.5 mg Oral Daily Mcarthur Rossetti, MD   2.5 mg at 03/04/17 0809  . lamoTRIgine (LAMICTAL) tablet 100 mg  100 mg Oral BID Mcarthur Rossetti, MD   100 mg at 03/04/17 0957  . levothyroxine (SYNTHROID, LEVOTHROID) tablet 75 mcg  75 mcg Oral QAC breakfast Mcarthur Rossetti, MD   75 mcg at 03/04/17 0809  . memantine (NAMENDA) tablet 10 mg  10 mg Oral BID Mcarthur Rossetti, MD   10 mg at 03/04/17 0957  . menthol-cetylpyridinium (CEPACOL) lozenge 3 mg  1 lozenge Oral PRN Mcarthur Rossetti, MD       Or  . phenol (CHLORASEPTIC) mouth spray 1 spray  1 spray Mouth/Throat PRN Mcarthur Rossetti, MD      . methocarbamol (ROBAXIN) tablet 500 mg  500 mg Oral Q6H PRN Mcarthur Rossetti, MD   500 mg at 03/04/17 0140   Or  . methocarbamol (ROBAXIN) 500 mg in dextrose 5 % 50 mL IVPB  500 mg Intravenous Q6H PRN Mcarthur Rossetti, MD      . metoCLOPramide (REGLAN) tablet 5-10 mg  5-10 mg Oral Q8H PRN Mcarthur Rossetti, MD       Or  . metoCLOPramide (REGLAN) injection 5-10 mg  5-10 mg Intravenous Q8H PRN Mcarthur Rossetti, MD      . morphine 4 MG/ML injection 2-4 mg  2-4 mg Intravenous Q2H PRN Mcarthur Rossetti, MD   4 mg at 03/03/17 1520  . ondansetron (ZOFRAN) tablet 4 mg  4 mg Oral Q6H PRN Mcarthur Rossetti, MD       Or  . ondansetron [facility]) injection 4 mg  4 mg Intravenous Q6H PRN Mcarthur Rossetti, MD      . oxyCODONE (Oxy IR/ROXICODONE) immediate release tablet 5-15 mg  5-15 mg Oral Q3H PRN Mcarthur Rossetti, MD   15 mg at 03/04/17 1113  . polyethylene  glycol (MIRALAX / GLYCOLAX) packet 17 g  17 g Oral Daily Mcarthur Rossetti, MD   17 g at 03/04/17 1344  . pravastatin (PRAVACHOL) tablet 80 mg  80 mg Oral QHS Mcarthur Rossetti, MD   80 mg at 03/03/17 2113  . rOPINIRole (REQUIP) tablet 1 mg  1 mg Oral QID Mcarthur Rossetti, MD   1 mg at 03/04/17 1347  . sennosides-docusate sodium (SENOKOT-S) 8.6-50 MG tablet 2 tablet  2 tablet Oral QHS Mcarthur Rossetti, MD   2 tablet at 03/03/17 2146  . spironolactone (ALDACTONE) tablet 25 mg  25 mg Oral BID Mcarthur Rossetti, MD   25 mg at 03/04/17 0809  . torsemide (DEMADEX) tablet 80 mg  80 mg Oral Daily Mcarthur Rossetti, MD   80 mg at 03/04/17 0957     Discharge Medications: Please see discharge summary for a list of discharge medications.  Relevant Imaging Results:  Relevant Lab Results:   Additional Information SS: [id number]  [name], LCSW

## 2017-03-05 LAB — GLUCOSE, CAPILLARY
Glucose-Capillary: 83 mg/dL (ref 65–99)
Glucose-Capillary: 87 mg/dL (ref 65–99)

## 2017-03-05 MED ORDER — FLEET ENEMA 7-19 GM/118ML RE ENEM
1.0000 | ENEMA | Freq: Once | RECTAL | Status: AC
  Administered 2017-03-05: 1 via RECTAL
  Filled 2017-03-05: qty 1

## 2017-03-05 NOTE — Progress Notes (Signed)
Patient ID: [name], female   DOB: 11/13/1945, 71 y.o.   MRN: [id number] The patientt has now decided to go to skilled nursing.  She can still be discharged today to skilled nursing instead of home.  She has been cleared by therapy and she is medically stable.  He left knee is also stable.  There is no medical reason to keep her in the an acute care bed at this standpoint.

## 2017-03-05 NOTE — Progress Notes (Signed)
Patient will discharge to [place] Anticipated discharge date: 7/13 Family notified: pt notified adult children Transportation by family Report #: [phone]  [place] signing off.  Jorge Ny, LCSW Clinical Social Worker [phone]

## 2017-03-05 NOTE — Discharge Summary (Signed)
Patient ID: [name] MRN: [id number] DOB/AGE: 09-18-1945 71 y.o.  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 Anemia   . Anxiety   . Arthritis    "hands, feet" (03/03/2017)  . Brain lesion    2 types  . Cervical spondylosis with myelopathy   . Chest pain    Normal cardiac cath 5/09  . Chronic lower back pain   . CKD (chronic kidney disease), stage III   . Congestive heart failure (CHF) ([place])   . Constipation   . Depression   . Dumping syndrome   . Dyspnea    with activity  . Edema   . Fatigue   . Fibromyalgia   . GERD (gastroesophageal reflux disease)   . Headache    "w/high CBG" (03/03/2017)  . History of echocardiogram    a. Echo 03/20/16 (done at [facility] in [place], [place]):  mild LVH, EF 94%, normal diastolic function, mild LAE, MAC, RVSP 25 mmHg  . Hyperlipidemia   . Hypertension   . Hypothyroidism   . Lumbar spondylosis   . Lymphedema    seeing specialist for this  . Migraine    "mostly stopped when I changed my diet" (03/03/2017)  . OSA on CPAP   . Pneumonia X 1  . Restless leg syndrome   . Stroke [facility])    TIAs "mini strokes"- unsure of last TIA  . Syncope   . Tremors of nervous system   . Type II diabetes mellitus (HCC)     Surgeries: Procedure(s): Left knee Revision of poly liner on 03/02/2017   Consultants:   Discharged Condition: Improved  Hospital Course: [name] is an 71 y.o. female who was admitted 03/02/2017 for operative treatment ofPolyethylene liner wear following total knee arthroplasty requiring isolated polyethylene liner exchange ([place]). Patient has severe unremitting pain that affects sleep, daily activities,  and work/hobbies. After pre-op clearance the patient was taken to the operating room on 03/02/2017 and underwent  Procedure(s): Left knee Revision of poly liner.    Patient was given perioperative antibiotics: Anti-infectives    Start     Dose/Rate Route Frequency Ordered Stop   03/02/17 1900  ceFAZolin (ANCEF) IVPB 1 g/50 mL premix     1 g 100 mL/hr over 30 Minutes Intravenous Every 6 hours 03/02/17 1833 03/03/17 0128   03/02/17 1023  ceFAZolin (ANCEF) 2-4 GM/100ML-% IVPB    Comments:  Merryl Hacker   : cabinet override      03/02/17 1023 03/02/17 1234   03/02/17 1019  ceFAZolin (ANCEF) IVPB 2g/100 mL premix     2 g 200 mL/hr over 30 Minutes Intravenous On call to O.R. 03/02/17 1019 03/02/17 1234       Patient was given sequential compression devices, early ambulation, and chemoprophylaxis to prevent DVT.  Patient benefited maximally from hospital stay and there were no complications.    Recent vital signs: Patient Vitals for the past 24 hrs:  BP Temp Temp src Pulse Resp SpO2 Weight  03/05/17 1015 (!) 116/59 97.7 F (36.5 C) - 71 18 100 % -  03/05/17 0544 (!) 112/56 98.6 F (37 C) Oral 73 18 96 % -  03/04/17 2143 (!) 105/36 98.3 F (36.8 C)  Oral 74 18 97 % -  03/04/17 1735 - - - - - - 253 lb 11.2 oz (115.1 kg)  03/04/17 1500 (!) 120/51 99.1 F (37.3 C) Oral 73 18 99 % -     Recent laboratory studies: No results for input(s): WBC, HGB, HCT, PLT, NA, K, CL, CO2, BUN, CREATININE, GLUCOSE, INR, CALCIUM in the last 72 hours.  Invalid input(s): PT, 2   Discharge Medications:   Allergies as of 03/05/2017      Reactions   Hydromorphone Other (See Comments)   Hallucinations, altered mental state      Medication List    STOP taking these medications   aspirin 81 MG tablet Replaced by:  aspirin 325 MG EC tablet   clopidogrel 75 MG tablet Commonly known as:  PLAVIX   HYDROcodone-acetaminophen 7.5-325 MG tablet Commonly known as:  NORCO     TAKE these medications    alendronate 70 MG tablet Commonly known as:  FOSAMAX Take 70 mg by mouth every Friday. Take with a full glass of water on an empty stomach.   amLODipine 10 MG tablet Commonly known as:  NORVASC Take 1 tablet (10 mg total) by mouth daily.   aspirin 325 MG EC tablet Take 1 tablet (325 mg total) by mouth 2 (two) times daily after a meal. Replaces:  aspirin 81 MG tablet   baclofen 10 MG tablet Commonly known as:  LIORESAL Take 10 mg by mouth 3 (three) times daily.   donepezil 10 MG tablet Commonly known as:  ARICEPT Take 10 mg by mouth at bedtime.   DULoxetine 20 MG capsule Commonly known as:  CYMBALTA Take 20 mg by mouth at bedtime.   escitalopram 20 MG tablet Commonly known as:  LEXAPRO Take 30 mg by mouth daily with supper.   fentaNYL 25 MCG/HR patch Commonly known as:  DURAGESIC - dosed mcg/hr Place 25 mcg onto the skin every 3 (three) days.   folic acid 1 MG tablet Commonly known as:  FOLVITE Take 1 mg by mouth 2 (two) times daily.   gabapentin 100 MG capsule Commonly known as:  NEURONTIN Take 100 mg by mouth at bedtime.   glipiZIDE 2.5 MG 24 hr tablet Commonly known as:  GLUCOTROL XL Take 2.5 mg by mouth daily.   lamoTRIgine 100 MG tablet Commonly known as:  LAMICTAL Take 100 mg by mouth 2 (two) times daily.   levothyroxine 75 MCG tablet Commonly known as:  SYNTHROID, LEVOTHROID Take 75 mcg by mouth daily before breakfast.   memantine 10 MG tablet Commonly known as:  NAMENDA Take 10 mg by mouth 2 (two) times daily.   metolazone 2.5 MG tablet Commonly known as:  ZAROXOLYN Take 1 tablet (2.5 mg total) by mouth once a week.   oxyCODONE-acetaminophen 5-325 MG tablet Commonly known as:  ROXICET Take 1-2 tablets by mouth every 4 (four) hours as needed for severe pain.   pravastatin 80 MG tablet Commonly known as:  PRAVACHOL Take 80 mg by mouth at bedtime.   ranitidine 150 MG tablet Commonly known as:  ZANTAC Take 150 mg by mouth 2 (two) times  daily.   rOPINIRole 1 MG tablet Commonly known as:  REQUIP Take 1 mg by mouth 4 (four) times daily.   sennosides-docusate sodium 8.6-50 MG tablet Commonly known as:  SENOKOT-S Take 2 tablets by mouth at bedtime.   spironolactone 25 MG tablet Commonly known as:  ALDACTONE Take 25 mg by mouth 2 (two) times daily.   torsemide 20  MG tablet Commonly known as:  DEMADEX Take 4 tablets (80 mg total) by mouth daily.   Turmeric 500 MG Caps Take 500 mg by mouth daily.   VITAMIN B-12 IJ Inject as directed See admin instructions. Weekly for 4 weeks, has 1 more dose this week (week of 7/2) then will switch to once a month       Diagnostic Studies: Dg Knee Left Port  Result Date: 03/02/2017 CLINICAL DATA:  Left knee prosthesis. EXAM: PORTABLE LEFT KNEE - 1-2 VIEW COMPARISON:  Radiographs of June 13, 2014. FINDINGS: The femoral and tibial components appear to be well situated. No fracture or dislocation is noted. Expected postoperative changes is seen in the anterior soft tissues. IMPRESSION: Left total knee arthroplasty appears to be well situated. Electronically Signed   By: Marijo Conception, M.D.   On: 03/02/2017 14:55    Disposition: to skilled nursing facility  Discharge Instructions    Discharge patient    Complete by:  As directed    Discharge disposition:  01-Home or Self Care   Discharge patient date:  03/05/2017   Discharge patient    Complete by:  As directed    Discharge disposition:  03-Skilled [place]   Discharge patient date:  03/05/2017       Contact information for follow-up providers    Mcarthur Rossetti, MD Follow up in 2 week(s).   Specialty:  Orthopedic Surgery Contact information: [place] [address] [phone]        Mcarthur Rossetti, MD Follow up.   Specialty:  Orthopedic Surgery Contact information: [place] [address] [phone]        [facility]  Follow up.   Why:  A representative from [facility] will contact you to arrange start date and time for your therapy. Contact information: [address] [place] [address] [phone]            Contact information for after-discharge care    [facility] .   Specialty:  Skilled Nursing Facility Contact information: [address] [place] [phone]                   Signed: Mcarthur Rossetti 03/05/2017, 12:24 PM

## 2017-03-05 NOTE — Care Management Important Message (Signed)
Important Message  Patient Details  Name: [name] MRN: [id number] Date of Birth: [birth year]   Medicare Important Message Given:  Yes    Orbie Pyo 03/05/2017, 1:59 PM

## 2017-03-05 NOTE — Discharge Summary (Signed)
Patient ID: [name] MRN: [id number] DOB/AGE: [birth year] 71 y.o.  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 Anemia   . Anxiety   . Arthritis    "hands, feet" (03/03/2017)  . Brain lesion    2 types  . Cervical spondylosis with myelopathy   . Chest pain    Normal cardiac cath 5/09  . Chronic lower back pain   . CKD (chronic kidney disease), stage III   . Congestive heart failure (CHF) ([place])   . Constipation   . Depression   . Dumping syndrome   . Dyspnea    with activity  . Edema   . Fatigue   . Fibromyalgia   . GERD (gastroesophageal reflux disease)   . Headache    "w/high CBG" (03/03/2017)  . History of echocardiogram    a. Echo 03/20/16 (done at [facility] in [place], [place]):  mild LVH, EF 93%, normal diastolic function, mild LAE, MAC, RVSP 25 mmHg  . Hyperlipidemia   . Hypertension   . Hypothyroidism   . Lumbar spondylosis   . Lymphedema    seeing specialist for this  . Migraine    "mostly stopped when I changed my diet" (03/03/2017)  . OSA on CPAP   . Pneumonia X 1  . Restless leg syndrome   . Stroke [facility])    TIAs "mini strokes"- unsure of last TIA  . Syncope   . Tremors of nervous system   . Type II diabetes mellitus (HCC)     Surgeries: Procedure(s): Left knee Revision of poly liner on 03/02/2017   Consultants:   Discharged Condition: Improved  Hospital Course: [name] is an 71 y.o. female who was admitted 03/02/2017 for operative treatment ofPolyethylene liner wear following total knee arthroplasty requiring isolated polyethylene liner exchange ([place]). Patient has severe unremitting pain that affects sleep, daily activities,  and work/hobbies. After pre-op clearance the patient was taken to the operating room on 03/02/2017 and underwent  Procedure(s): Left knee Revision of poly liner.    Patient was given perioperative antibiotics: Anti-infectives    Start     Dose/Rate Route Frequency Ordered Stop   03/02/17 1900  ceFAZolin (ANCEF) IVPB 1 g/50 mL premix     1 g 100 mL/hr over 30 Minutes Intravenous Every 6 hours 03/02/17 1833 03/03/17 0128   03/02/17 1023  ceFAZolin (ANCEF) 2-4 GM/100ML-% IVPB    Comments:  Merryl Hacker   : cabinet override      03/02/17 1023 03/02/17 1234   03/02/17 1019  ceFAZolin (ANCEF) IVPB 2g/100 mL premix     2 g 200 mL/hr over 30 Minutes Intravenous On call to O.R. 03/02/17 1019 03/02/17 1234       Patient was given sequential compression devices, early ambulation, and chemoprophylaxis to prevent DVT.  Patient benefited maximally from hospital stay and there were no complications.    Recent vital signs: Patient Vitals for the past 24 hrs:  BP Temp Temp src Pulse Resp SpO2 Weight  03/05/17 0544 (!) 112/56 98.6 F (37 C) Oral 73 18 96 % -  03/04/17 2143 (!) 105/36 98.3 F (36.8 C) Oral 74 18 97 % -  03/04/17 1735 - - - - - -  253 lb 11.2 oz (115.1 kg)  03/04/17 1500 (!) 120/51 99.1 F (37.3 C) Oral 73 18 99 % -  03/04/17 0947 (!) 124/48 97.9 F (36.6 C) - 70 18 94 % -     Recent laboratory studies: No results for input(s): WBC, HGB, HCT, PLT, NA, K, CL, CO2, BUN, CREATININE, GLUCOSE, INR, CALCIUM in the last 72 hours.  Invalid input(s): PT, 2   Discharge Medications:   Allergies as of 03/05/2017      Reactions   Hydromorphone Other (See Comments)   Hallucinations, altered mental state      Medication List    STOP taking these medications   aspirin 81 MG tablet Replaced by:  aspirin 325 MG EC tablet   clopidogrel 75 MG tablet Commonly known as:  PLAVIX   HYDROcodone-acetaminophen 7.5-325 MG tablet Commonly known as:  NORCO     TAKE these medications    alendronate 70 MG tablet Commonly known as:  FOSAMAX Take 70 mg by mouth every Friday. Take with a full glass of water on an empty stomach.   amLODipine 10 MG tablet Commonly known as:  NORVASC Take 1 tablet (10 mg total) by mouth daily.   aspirin 325 MG EC tablet Take 1 tablet (325 mg total) by mouth 2 (two) times daily after a meal. Replaces:  aspirin 81 MG tablet   baclofen 10 MG tablet Commonly known as:  LIORESAL Take 10 mg by mouth 3 (three) times daily.   donepezil 10 MG tablet Commonly known as:  ARICEPT Take 10 mg by mouth at bedtime.   DULoxetine 20 MG capsule Commonly known as:  CYMBALTA Take 20 mg by mouth at bedtime.   escitalopram 20 MG tablet Commonly known as:  LEXAPRO Take 30 mg by mouth daily with supper.   fentaNYL 25 MCG/HR patch Commonly known as:  DURAGESIC - dosed mcg/hr Place 25 mcg onto the skin every 3 (three) days.   folic acid 1 MG tablet Commonly known as:  FOLVITE Take 1 mg by mouth 2 (two) times daily.   gabapentin 100 MG capsule Commonly known as:  NEURONTIN Take 100 mg by mouth at bedtime.   glipiZIDE 2.5 MG 24 hr tablet Commonly known as:  GLUCOTROL XL Take 2.5 mg by mouth daily.   lamoTRIgine 100 MG tablet Commonly known as:  LAMICTAL Take 100 mg by mouth 2 (two) times daily.   levothyroxine 75 MCG tablet Commonly known as:  SYNTHROID, LEVOTHROID Take 75 mcg by mouth daily before breakfast.   memantine 10 MG tablet Commonly known as:  NAMENDA Take 10 mg by mouth 2 (two) times daily.   metolazone 2.5 MG tablet Commonly known as:  ZAROXOLYN Take 1 tablet (2.5 mg total) by mouth once a week.   oxyCODONE-acetaminophen 5-325 MG tablet Commonly known as:  ROXICET Take 1-2 tablets by mouth every 4 (four) hours as needed for severe pain.   pravastatin 80 MG tablet Commonly known as:  PRAVACHOL Take 80 mg by mouth at bedtime.   ranitidine 150 MG tablet Commonly known as:  ZANTAC Take 150 mg by mouth 2 (two) times  daily.   rOPINIRole 1 MG tablet Commonly known as:  REQUIP Take 1 mg by mouth 4 (four) times daily.   sennosides-docusate sodium 8.6-50 MG tablet Commonly known as:  SENOKOT-S Take 2 tablets by mouth at bedtime.   spironolactone 25 MG tablet Commonly known as:  ALDACTONE Take 25 mg by mouth 2 (two) times daily.   torsemide 20  MG tablet Commonly known as:  DEMADEX Take 4 tablets (80 mg total) by mouth daily.   Turmeric 500 MG Caps Take 500 mg by mouth daily.   VITAMIN B-12 IJ Inject as directed See admin instructions. Weekly for 4 weeks, has 1 more dose this week (week of 7/2) then will switch to once a month       Diagnostic Studies: Dg Knee Left Port  Result Date: 03/02/2017 CLINICAL DATA:  Left knee prosthesis. EXAM: PORTABLE LEFT KNEE - 1-2 VIEW COMPARISON:  Radiographs of June 13, 2014. FINDINGS: The femoral and tibial components appear to be well situated. No fracture or dislocation is noted. Expected postoperative changes is seen in the anterior soft tissues. IMPRESSION: Left total knee arthroplasty appears to be well situated. Electronically Signed   By: Marijo Conception, M.D.   On: 03/02/2017 14:55    Disposition: 01-Home or Self Care  Discharge Instructions    Discharge patient    Complete by:  As directed    Discharge disposition:  01-Home or Self Care   Discharge patient date:  03/05/2017      Follow-up Information    Mcarthur Rossetti, MD Follow up in 2 week(s).   Specialty:  Orthopedic Surgery Contact information: [place] [address] [phone]        Mcarthur Rossetti, MD Follow up.   Specialty:  Orthopedic Surgery Contact information: [address] [place] [address] [phone]            Signed: Mcarthur Rossetti 03/05/2017, 7:01 AM

## 2017-03-05 NOTE — Care Management Note (Signed)
Case Management Note  Patient Details  Name: [name] MRN: [id number] Date of Birth: 1946-02-07  Subjective/Objective:     71 yr old female s/p left total knee arthroplasty.               Action/Plan: Case manager and social worker spoke with patient concerning discharge plan. Patient was intially preparing to go to SNF for shortterm rehab, has now decided she will go home. Patient says she will have support from her family.A daughter and son in law live next door and another daughter will be staying with patient. She has RW and 3in1. Choice for [facility] was offered, referral was called to Stevie Kern, [facility] Liaison. No further Case manager needs identified.    Expected Discharge Date:  03/05/17               Expected Discharge Plan:  [place]  In-House Referral:  NA  Discharge planning Services  CM Consult  Post Acute Care Choice:  Home Health Choice offered to:  Patient  DME Arranged:  N/A (HAs RW and 3in1) DME Agency:  NA  HH Arranged:  PT HH Agency:  [facility]  Status of Service:  Completed, signed off  If discussed at [place] of Stay Meetings, dates discussed:    Additional Comments:  Ninfa Meeker, RN 03/05/2017, 11:08 AM

## 2017-03-05 NOTE — Progress Notes (Signed)
Patient ID: [name], female   DOB: 10-27-1945, 71 y.o.   MRN: [id number] Doing well.  Can be discharged to home today.

## 2017-03-05 NOTE — Progress Notes (Signed)
Physical Therapy Treatment Patient Details Name: [name] MRN: [id number] DOB: 08-22-1946 Today's Date: 03/05/2017    History of Present Illness Pt presents for L knee polyethylene liner exchange after TKA. PMH CAD, h/o colon cancer, DM, CHF, HTN, anxiety, R TKA, multiple shouder surgeries.      PT Comments    Pt eager to transfer from chair to bed with PT. Pt able to ambulate further distance with RW and still needs physical assist for transfers and ambulation. Pt educated again on reason for recommendation for SNF and PT discussed with daughter over phone as well. Will follow acutely for improving mobilization and strengthening for improved overall function.   Follow Up Recommendations  SNF;Supervision/Assistance - 24 hour     Equipment Recommendations  None recommended by PT    Recommendations for Other Services       Precautions / Restrictions Precautions Precautions: Knee;Fall Restrictions Weight Bearing Restrictions: Yes LLE Weight Bearing: Weight bearing as tolerated    Mobility  Bed Mobility Overal bed mobility: Needs Assistance Bed Mobility: Sit to Supine       Sit to supine: Min assist   General bed mobility comments: Min A to bring LEs up onto bed   Transfers Overall transfer level: Needs assistance   Transfers: Sit to/from Stand Sit to Stand: Mod assist         General transfer comment: cues for hand placement and sequence, assist to rise from chair and BSC with increased time to complete transfers  Ambulation/Gait Ambulation/Gait assistance: Min assist Ambulation Distance (Feet): 15 Feet Assistive device: Rolling walker (2 wheeled) Gait Pattern/deviations: Shuffle;Trunk flexed;Decreased stance time - left   Gait velocity interpretation: Below normal speed for age/gender General Gait Details: cues for sequence and progression   Stairs            Wheelchair Mobility    Modified Rankin (Stroke Patients Only)       Balance  Overall balance assessment: Needs assistance   Sitting balance-Leahy Scale: Good     Standing balance support: Bilateral upper extremity supported;During functional activity Standing balance-Leahy Scale: Poor Standing balance comment: unable to stand without UE support                            Cognition Arousal/Alertness: Awake/alert Behavior During Therapy: WFL for tasks assessed/performed Overall Cognitive Status: Within Functional Limits for tasks assessed                                        Exercises General Exercises - Lower Extremity Quad Sets: 15 reps;Supine;AROM;Left Long Arc Quad: AROM;Left;15 reps;Seated Heel Slides: AROM;15 reps;Left;Supine Hip ABduction/ADduction: AROM;15 reps;Left;Supine    General Comments        Pertinent Vitals/Pain Pain Assessment: 0-10 Pain Score: 5  Pain Location: left knee Pain Descriptors / Indicators: Aching;Sore Pain Intervention(s): Limited activity within patient's tolerance;Monitored during session;Patient requesting pain meds-RN notified;Repositioned    Home Living                      Prior Function            PT Goals (current goals can now be found in the care plan section) Progress towards PT goals: Progressing toward goals    Frequency    Min 5X/week      PT Plan Current plan remains appropriate  Co-evaluation              AM-PAC PT "6 Clicks" Daily Activity  Outcome Measure  Difficulty turning over in bed (including adjusting bedclothes, sheets and blankets)?: Total Difficulty moving from lying on back to sitting on the side of the bed? : Total Difficulty sitting down on and standing up from a chair with arms (e.g., wheelchair, bedside commode, etc,.)?: Total Help needed moving to and from a bed to chair (including a wheelchair)?: A Lot Help needed walking in hospital room?: A Little Help needed climbing 3-5 steps with a railing? : Total 6 Click Score:  9    End of Session Equipment Utilized During Treatment: Gait belt Activity Tolerance: Patient tolerated treatment well Patient left: in bed;with bed alarm set;with nursing/sitter in room;with call bell/phone within reach Nurse Communication: Mobility status;Precautions PT Visit Diagnosis: Unsteadiness on feet (R26.81);Muscle weakness (generalized) (M62.81);Difficulty in walking, not elsewhere classified (R26.2);Pain Pain - Right/Left: Left Pain - part of body: Knee     Time: 1119-1150 PT Time Calculation (min) (ACUTE ONLY): 31 min  Charges:  $Therapeutic Exercise: 8-22 mins $Therapeutic Activity: 8-22 mins                    G Codes:       Elberta Leatherwood, SPT Acute Rehab [place] 03/05/2017, 12:32 PM

## 2017-03-09 NOTE — Clinical Social Work Placement (Signed)
   CLINICAL SOCIAL WORK PLACEMENT  NOTE  Date:  03/09/2017  Patient Details  Name: [name] MRN: [id number] Date of Birth: Mar 15, 1946  Clinical Social Work is seeking post-discharge placement for this patient at the [place] level of care (*CSW will initial, date and re-position this form in  chart as items are completed):  Yes   Patient/family provided with [facility] Work Department's list of facilities offering this level of care within the geographic area requested by the patient (or if unable, by the patient's family).  Yes   Patient/family informed of their freedom to choose among providers that offer the needed level of care, that participate in Medicare, Medicaid or managed care program needed by the patient, have an available bed and are willing to accept the patient.  Yes   Patient/family informed of [facility]'s ownership interest in [facility] and [facility], as well as of the fact that they are under no obligation to receive care at these facilities.  PASRR submitted to EDS on 03/04/17     PASRR number received on 03/04/17     Existing PASRR number confirmed on       FL2 transmitted to all facilities in geographic area requested by pt/family on 03/04/17     FL2 transmitted to all facilities within larger geographic area on       Patient informed that his/her managed care company has contracts with or will negotiate with certain facilities, including the following:        Yes   Patient/family informed of bed offers received.  Patient chooses bed at [facility]     Physician recommends and patient chooses bed at      Patient to be transferred to [facility] on 03/05/17.  Patient to be transferred to facility by family     Patient family notified on 03/05/17 of transfer.  Name of family member notified:        PHYSICIAN Please sign FL2     Additional Comment:     _______________________________________________ Jorge Ny, LCSW 03/09/2017, 10:20 AM

## 2017-03-16 ENCOUNTER — Ambulatory Visit (INDEPENDENT_AMBULATORY_CARE_PROVIDER_SITE_OTHER): Payer: No Typology Code available for payment source

## 2017-03-16 ENCOUNTER — Ambulatory Visit (INDEPENDENT_AMBULATORY_CARE_PROVIDER_SITE_OTHER): Payer: Medicare Other | Admitting: Orthopaedic Surgery

## 2017-03-16 DIAGNOSIS — M25562 Pain in left knee: Secondary | ICD-10-CM

## 2017-03-16 DIAGNOSIS — T84093D Other mechanical complication of internal left knee prosthesis, subsequent encounter: Secondary | ICD-10-CM

## 2017-03-16 DIAGNOSIS — T84063D Wear of articular bearing surface of internal prosthetic left knee joint, subsequent encounter: Secondary | ICD-10-CM

## 2017-03-16 NOTE — Progress Notes (Signed)
The patient is 2 weeks status post a left knee polyethylene liner exchange and upsizing or polyp. Her total knee is been done elsewhere and over a long period time should stretch out her ligaments. She had only a millimeter polyethylene insert we're able to go up a few sizes. She says the knee does feel more stable to her. She's been convalescing a nursing facility. Deep her on examination her left knee incision looks great. We will have the staples removed and Steri-Strips applied. A soft. She's got good range of motion of her left knee and feels ligamentously stable. Her graft 2 views of the left knee are obtained and show well-seated implant with no, getting features. Of note she did have a fall recently and I see notes of fracture.  This point transition from skilled nursing facility home with home therapy. I like see her back in a month to see how she is doing overall but no x-rays are needed. All questions were encouraged and answered.

## 2017-03-30 ENCOUNTER — Telehealth (HOSPITAL_COMMUNITY): Payer: Self-pay | Admitting: *Deleted

## 2017-03-30 NOTE — Telephone Encounter (Signed)
cardiomems has been stable, called and spoke w/pt's daughter she is aware and will advise pt

## 2017-03-30 NOTE — Telephone Encounter (Signed)
Patient left message on triage line stating that she is now back home from rehab center after her knee surgery.  She wanted to see if someone could call her today to discuss her cardiomems.   Will forward to [employer], RN who handles cardiomems to return patient's call.

## 2017-04-01 ENCOUNTER — Telehealth (HOSPITAL_COMMUNITY): Payer: Self-pay

## 2017-04-01 NOTE — Telephone Encounter (Signed)
Received VM on CHF clinic triage line of patient reporting cardiomems pillow has not been working and cuts off before it finishes her scan, however, patient also reports she spoke with company who reports transmissions have been going through. Patient calling to ask Heather/Amy if her transmissions are being received on their end. Will forward to them to confirm.  Renee Pain, RN

## 2017-04-01 NOTE — Telephone Encounter (Signed)
Spoke w/pt, advised cardiomems readings are going through and are good for pt, she was t 14 today which is great for her, pt aware and agreeable.

## 2017-04-12 ENCOUNTER — Encounter (HOSPITAL_COMMUNITY): Payer: PRIVATE HEALTH INSURANCE | Admitting: Cardiology

## 2017-04-13 ENCOUNTER — Ambulatory Visit (INDEPENDENT_AMBULATORY_CARE_PROVIDER_SITE_OTHER): Payer: Medicare Other | Admitting: Physician Assistant

## 2017-04-13 ENCOUNTER — Ambulatory Visit (INDEPENDENT_AMBULATORY_CARE_PROVIDER_SITE_OTHER): Payer: Medicare Other

## 2017-04-13 DIAGNOSIS — M791 Myalgia: Secondary | ICD-10-CM | POA: Diagnosis not present

## 2017-04-13 DIAGNOSIS — M7918 Myalgia, other site: Secondary | ICD-10-CM

## 2017-04-13 DIAGNOSIS — M25512 Pain in left shoulder: Secondary | ICD-10-CM

## 2017-04-13 NOTE — Progress Notes (Signed)
Office Visit Note   Patient: [name]           Date of Birth: 28-Jun-1946           MRN: [id number] Visit Date: 04/13/2017              Requested by: Dione Housekeeper, MD [address] [place], [place] [id number] PCP: Dione Housekeeper, MD   Assessment & Plan: Visit Diagnoses:  1. Acute pain of left shoulder   2. Buttock pain     Plan: We'll send her to physical therapy for range of motion and strengthening both her left shoulder and left knee. See her back in 1 month check progress lack of. She is reminded she needs monitoring glucose levels closely for the next coming days after receiving the cortisone injection today. She had increased ease of motion of the shoulder and decreased pain after the cortisone injection today.  Follow-Up Instructions: No Follow-up on file.   Orders:  Orders Placed This Encounter  Procedures  . XR Shoulder Left  . XR Sacrum/Coccyx   No orders of the defined types were placed in this encounter.     Procedures: No procedures performed   Clinical Data: No additional findings.   Subjective: Left shoulder and buttocks pain  HPI Sways well-known Dr. Ninfa Linden services comes into today due to a fall last week. She has shoulder pain and a large bruise on her bottom status post fall. She states that last week she is walking with her walker been down to get her phone which was ringing and the walker slid out from under her causing her to fall. She denies any loss of consciousness dizziness or headache injury. She has a history of a left shoulder open rotator cuff repair in 2012 by Dr. French Ana. States shoulder been doing well up until this point. She's now having decreased range of motion and pain in the shoulder. She's having difficulty sitting due to the bruising on her bottom pain. She also underwent a left knee revision with polyethylene exchange and 17 and 18. She states that she is been working with home physical therapy but is still not able  far quad. Review of Systems Denies loss of consciousness chest pain shortness breath or dizziness. Otherwise please see history of present illness  Objective: Vital Signs: There were no vitals taken for this visit.  Physical Exam  Constitutional: She is oriented to person, place, and time. She appears well-developed and well-nourished.  Neurological: She is alert and oriented to person, place, and time.  Skin: She is not diaphoretic.  Psychiatric: She has a normal mood and affect.    Ortho Exam Left shoulder decreased range of motion and pain. Fluid motion however shoulder appears to be well located. She has 5 out of 5 strengths bilateral shoulders with external and internal rotation against resistance. Tenderness over the left shoulder anterior and particularly over the bicipital groove. Nontender over the biceps muscle belly. No rashes skin lesions ulcerations or ecchymosis about the shoulder. Left knee surgical incisions healing well. Bring her full extension flexion to at least 100. She is able to fire quads but does have some weakness. Specialty Comments:  No specialty comments available.  Imaging: No results found.   PMFS History: Patient Active Problem List   Diagnosis Date Noted  . 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 Unstable angina (  [place]) 05/21/2016  . Chronic diastolic heart failure ([place]) 03/29/2016  . Normal coronary arteries 2009 01/14/2015  . Obesity-BMI 40 01/14/2015  . Restless leg 01/14/2015  . Chest pain 01/14/2015  . Back pain 01/14/2015  . Renal insufficiency 01/14/2015  . Dementia- mild memory issues 01/14/2015  . Occult blood positive stool 12/14/2014  . Anemia 12/14/2014  . Family history of colon cancer 12/14/2014  . Change in bowel habits 12/14/2014  . GERD (gastroesophageal reflux disease)  12/14/2014  . Dyspnea 05/17/2012  . Lymphedema 05/17/2012  . Weight gain 05/17/2012  . HTN (hypertension) 05/17/2012   Past Medical History:  Diagnosis Date  . Anemia   . Anxiety   . Arthritis    "hands, feet" (03/03/2017)  . Brain lesion    2 types  . Cervical spondylosis with myelopathy   . Chest pain    Normal cardiac cath 5/09  . Chronic lower back pain   . CKD (chronic kidney disease), stage III   . Congestive heart failure (CHF) ([place])   . Constipation   . Depression   . Dumping syndrome   . Dyspnea    with activity  . Edema   . Fatigue   . Fibromyalgia   . GERD (gastroesophageal reflux disease)   . Headache    "w/high CBG" (03/03/2017)  . History of echocardiogram    a. Echo 03/20/16 (done at [facility] in [place], [place]):  mild LVH, EF 85%, normal diastolic function, mild LAE, MAC, RVSP 25 mmHg  . Hyperlipidemia   . Hypertension   . Hypothyroidism   . Lumbar spondylosis   . Lymphedema    seeing specialist for this  . Migraine    "mostly stopped when I changed my diet" (03/03/2017)  . OSA on CPAP   . Pneumonia X 1  . Restless leg syndrome   . Stroke [facility])    TIAs "mini strokes"- unsure of last TIA  . Syncope   . Tremors of nervous system   . Type II diabetes mellitus (HCC)     Family History  Problem Relation Age of Onset  . Heart disease Mother   . Heart failure Mother   . Kidney disease Mother   . Lung cancer Father   . Colon cancer Brother   . Cancer Brother        malignant thymoma  . Heart disease Brother   . Tremor Maternal Grandmother 90  . Ovarian cancer Maternal Grandmother   . Uterine cancer Maternal Grandmother   . Irritable bowel syndrome Daughter   . Esophageal cancer Neg Hx   . Stomach cancer Neg Hx   . Rectal cancer Neg Hx     Past Surgical History:  Procedure Laterality Date  . APPENDECTOMY  1966  . BACK SURGERY    . CARDIAC CATHETERIZATION N/A 05/25/2016   Procedure: Right/Left Heart Cath and Coronary Angiography;  Surgeon:  Larey Dresser, MD;  Location: [facility];  Service: Cardiovascular;  Laterality: N/A;  . CATARACT EXTRACTION W/ INTRAOCULAR LENS  IMPLANT, BILATERAL Bilateral   . COLONOSCOPY    . CORONARY ANGIOGRAM  2009   Normal coronaries  . EXCISION/RELEASE BURSA HIP Bilateral   . I&D KNEE WITH POLY EXCHANGE Left 03/02/2017   Procedure: Left knee Revision of poly liner;  Surgeon: Mcarthur Rossetti, MD;  Location: [facility];  Service: Orthopedics;  Laterality: Left;  . JOINT REPLACEMENT    . KNEE ARTHROSCOPY Bilateral   . LAPAROSCOPIC CHOLECYSTECTOMY  2015  .  LUMBAR DISC SURGERY    . LUMBAR LAMINECTOMY/DECOMPRESSION MICRODISCECTOMY Left 12/2005   L2-3 laminectomy and diskectomy/notes 01/06/2011  . POSTERIOR LUMBAR FUSION  04/2006   [name] 01/06/2011; "put cages in"  . REVISION TOTAL KNEE ARTHROPLASTY Left 03/02/2017   poly liner/notes 03/02/2017  . RIGHT HEART CATH N/A 11/13/2016   Procedure: Right Heart Cath with Cardiomems;  Surgeon: Larey Dresser, MD;  Location: [facility];  Service: Cardiovascular;  Laterality: N/A;  . SHOULDER ARTHROSCOPY WITH ROTATOR CUFF REPAIR Right   . SHOULDER OPEN ROTATOR CUFF REPAIR Left 01/2011   [name] 01/29/2011  . TOTAL ABDOMINAL HYSTERECTOMY    . TOTAL KNEE ARTHROPLASTY Left   . TOTAL KNEE ARTHROPLASTY Right 11/18/2012   Procedure: TOTAL KNEE ARTHROPLASTY;  Surgeon: Yvette Rack., MD;  Location: [facility];  Service: Orthopedics;  Laterality: Right;  . TUBAL LIGATION    . UPPER GI ENDOSCOPY     Social History   Occupational History  . retired    Social History Main Topics  . Smoking status: Never Smoker  . Smokeless tobacco: Never Used  . Alcohol use No  . Drug use: No  . Sexual activity: No

## 2017-04-19 ENCOUNTER — Emergency Department (HOSPITAL_COMMUNITY): Payer: Medicare Other

## 2017-04-19 ENCOUNTER — Emergency Department (HOSPITAL_COMMUNITY)
Admission: EM | Admit: 2017-04-19 | Discharge: 2017-04-19 | Disposition: A | Discharge status: Home / Self Care | Payer: Medicare Other | Attending: Emergency Medicine | Admitting: Emergency Medicine

## 2017-04-19 ENCOUNTER — Encounter (HOSPITAL_COMMUNITY): Payer: Self-pay | Admitting: Emergency Medicine

## 2017-04-19 DIAGNOSIS — I509 Heart failure, unspecified: Secondary | ICD-10-CM | POA: Insufficient documentation

## 2017-04-19 DIAGNOSIS — E039 Hypothyroidism, unspecified: Secondary | ICD-10-CM | POA: Diagnosis not present

## 2017-04-19 DIAGNOSIS — Z7984 Long term (current) use of oral hypoglycemic drugs: Secondary | ICD-10-CM | POA: Diagnosis not present

## 2017-04-19 DIAGNOSIS — W010XXA Fall on same level from slipping, tripping and stumbling without subsequent striking against object, initial encounter: Secondary | ICD-10-CM | POA: Insufficient documentation

## 2017-04-19 DIAGNOSIS — Y939 Activity, unspecified: Secondary | ICD-10-CM | POA: Diagnosis not present

## 2017-04-19 DIAGNOSIS — I2510 Atherosclerotic heart disease of native coronary artery without angina pectoris: Secondary | ICD-10-CM | POA: Diagnosis not present

## 2017-04-19 DIAGNOSIS — S0990XA Unspecified injury of head, initial encounter: Secondary | ICD-10-CM

## 2017-04-19 DIAGNOSIS — Y929 Unspecified place or not applicable: Secondary | ICD-10-CM | POA: Diagnosis not present

## 2017-04-19 DIAGNOSIS — Y999 Unspecified external cause status: Secondary | ICD-10-CM | POA: Diagnosis not present

## 2017-04-19 DIAGNOSIS — Z79899 Other long term (current) drug therapy: Secondary | ICD-10-CM | POA: Diagnosis not present

## 2017-04-19 DIAGNOSIS — I130 Hypertensive heart and chronic kidney disease with heart failure and stage 1 through stage 4 chronic kidney disease, or unspecified chronic kidney disease: Secondary | ICD-10-CM | POA: Insufficient documentation

## 2017-04-19 DIAGNOSIS — M25511 Pain in right shoulder: Secondary | ICD-10-CM

## 2017-04-19 DIAGNOSIS — Z7982 Long term (current) use of aspirin: Secondary | ICD-10-CM | POA: Diagnosis not present

## 2017-04-19 DIAGNOSIS — W19XXXA Unspecified fall, initial encounter: Secondary | ICD-10-CM

## 2017-04-19 DIAGNOSIS — Z7902 Long term (current) use of antithrombotics/antiplatelets: Secondary | ICD-10-CM | POA: Insufficient documentation

## 2017-04-19 DIAGNOSIS — M25562 Pain in left knee: Secondary | ICD-10-CM | POA: Diagnosis not present

## 2017-04-19 DIAGNOSIS — M25512 Pain in left shoulder: Secondary | ICD-10-CM | POA: Insufficient documentation

## 2017-04-19 DIAGNOSIS — M545 Low back pain, unspecified: Secondary | ICD-10-CM

## 2017-04-19 DIAGNOSIS — Z96652 Presence of left artificial knee joint: Secondary | ICD-10-CM | POA: Diagnosis not present

## 2017-04-19 DIAGNOSIS — E1122 Type 2 diabetes mellitus with diabetic chronic kidney disease: Secondary | ICD-10-CM | POA: Insufficient documentation

## 2017-04-19 DIAGNOSIS — N183 Chronic kidney disease, stage 3 (moderate): Secondary | ICD-10-CM | POA: Diagnosis not present

## 2017-04-19 MED ORDER — OXYCODONE-ACETAMINOPHEN 5-325 MG PO TABS: 2.0000 | ORAL_TABLET | Freq: Once | ORAL | Status: DC

## 2017-04-19 MED ORDER — ONDANSETRON HCL 4 MG/2ML IJ SOLN
4.0000 mg | Freq: Once | INTRAMUSCULAR | Status: AC
  Administered 2017-04-19: 4 mg via INTRAVENOUS
  Filled 2017-04-19: qty 2

## 2017-04-19 MED ORDER — FENTANYL CITRATE (PF) 100 MCG/2ML IJ SOLN
50.0000 ug | Freq: Once | INTRAMUSCULAR | Status: AC
  Administered 2017-04-19: 50 ug via INTRAVENOUS
  Filled 2017-04-19: qty 2

## 2017-04-19 NOTE — ED Notes (Signed)
Pt ambulatory with walker down the hallway, standby assistance. Pt reports increased pain in shoulders from holding self up with walker.

## 2017-04-19 NOTE — ED Notes (Signed)
Pt taken out in wheelchair, assisted into car. Son taking pt home and belongings given to son.

## 2017-04-19 NOTE — ED Notes (Signed)
Pt assisted to restroom via STDEY machine.

## 2017-04-19 NOTE — ED Triage Notes (Signed)
Per EMS, pt reports stubbing her toe and falling this morning. Pt reports she was using her walker and fell straight back, hit her head and shoulders on the floor. Denies being on blood thinners. Pt c/o 8/10 bilateral shoulder, mid lower back pain. Pt reports falling 2 times this past week. Pt had a left knee surgery in July and is currently in rehab for that.

## 2017-04-19 NOTE — ED Notes (Signed)
Patient transported to X-ray 

## 2017-04-19 NOTE — ED Provider Notes (Addendum)
TIME SEEN: 2:28 AM  CHIEF COMPLAINT: fall  HPI: Patient is a 71 year old female with history of depression, CHF, CAD who presents to the emergency department after a mechanical fall. She reports that she stubbed her right great toe and this caused her to fall backwards and she did hit her head but did not lose consciousness. She is not on antiplatelets or anticoagulants. She reports having lateral shoulder pain, left-sided chest pain, left knee pain and lower back pain. Has previously had lumbar surgery and has had recent left knee replacement. No numbness or focal weakness. She currently has a fentanyl patch on for chronic knee and back pain.  ROS: See HPI Constitutional: no fever  Eyes: no drainage  ENT: no runny nose   Cardiovascular:  no chest pain  Resp: no SOB  GI: no vomiting GU: no dysuria Integumentary: no rash  Allergy: no hives  Musculoskeletal: no leg swelling  Neurological: no slurred speech ROS otherwise negative  PAST MEDICAL HISTORY/PAST SURGICAL HISTORY:  Past Medical History:  Diagnosis Date  . Anemia   . Anxiety   . Arthritis    "hands, feet" (03/03/2017)  . Brain lesion    2 types  . Cervical spondylosis with myelopathy   . Chest pain    Normal cardiac cath 5/09  . Chronic lower back pain   . CKD (chronic kidney disease), stage III   . Congestive heart failure (CHF) ([place])   . Constipation   . Depression   . Dumping syndrome   . Dyspnea    with activity  . Edema   . Fatigue   . Fibromyalgia   . GERD (gastroesophageal reflux disease)   . Headache    "w/high CBG" (03/03/2017)  . History of echocardiogram    a. Echo 03/20/16 (done at [facility] in [place], [place]):  mild LVH, EF 14%, normal diastolic function, mild LAE, MAC, RVSP 25 mmHg  . Hyperlipidemia   . Hypertension   . Hypothyroidism   . Lumbar spondylosis   . Lymphedema    seeing specialist for this  . Migraine    "mostly stopped when I changed my diet" (03/03/2017)  . OSA on CPAP   .  Pneumonia X 1  . Restless leg syndrome   . Stroke [facility])    TIAs "mini strokes"- unsure of last TIA  . Syncope   . Tremors of nervous system   . Type II diabetes mellitus (HCC)     MEDICATIONS:  Prior to Admission medications   Medication Sig Start Date End Date Taking? Authorizing Provider  alendronate (FOSAMAX) 70 MG tablet Take 70 mg by mouth every Friday. Take with a full glass of water on an empty stomach.     [provider]  amLODipine (NORVASC) 10 MG tablet Take 1 tablet (10 mg total) by mouth daily. 10/20/16   Darrick Grinder D, NP  aspirin EC 325 MG EC tablet Take 1 tablet (325 mg total) by mouth 2 (two) times daily after a meal. 03/04/17   Carlis Abbott, Gillermo Murdoch, PA-C  baclofen (LIORESAL) 10 MG tablet Take 10 mg by mouth 3 (three) times daily.    [provider]  Cyanocobalamin (VITAMIN B-12 IJ) Inject as directed See admin instructions. Weekly for 4 weeks, has 1 more dose this week (week of 7/2) then will switch to once a month    [provider]  donepezil (ARICEPT) 10 MG tablet Take 10 mg by mouth at bedtime.  11/28/14   [provider]  DULoxetine (CYMBALTA) 20 MG capsule Take 20 mg by mouth at bedtime.  11/09/16   [provider]  escitalopram (LEXAPRO) 20 MG tablet Take 30 mg by mouth daily with supper.  12/06/14   [provider]  fentaNYL (DURAGESIC - DOSED MCG/HR) 25 MCG/HR patch Place 25 mcg onto the skin every 3 (three) days.  07/22/15   [provider]  folic acid (FOLVITE) 1 MG tablet Take 1 mg by mouth 2 (two) times daily.    [provider]  gabapentin (NEURONTIN) 100 MG capsule Take 100 mg by mouth at bedtime.    [provider]  glipiZIDE (GLUCOTROL XL) 2.5 MG 24 hr tablet Take 2.5 mg by mouth daily. 01/19/17   [provider]  lamoTRIgine (LAMICTAL) 100 MG tablet Take 100 mg by mouth 2 (two) times daily.  11/03/14   [provider]  levothyroxine (SYNTHROID, LEVOTHROID) 75 MCG tablet  Take 75 mcg by mouth daily before breakfast.    [provider]  memantine (NAMENDA) 10 MG tablet Take 10 mg by mouth 2 (two) times daily. 04/06/16   [provider]  metolazone (ZAROXOLYN) 2.5 MG tablet Take 1 tablet (2.5 mg total) by mouth once a week. 12/09/16   Larey Dresser, MD  oxyCODONE-acetaminophen (ROXICET) 5-325 MG tablet Take 1-2 tablets by mouth every 4 (four) hours as needed for severe pain. 03/04/17   Pete Pelt, PA-C  pravastatin (PRAVACHOL) 80 MG tablet Take 80 mg by mouth at bedtime.  12/10/14   [provider]  ranitidine (ZANTAC) 150 MG tablet Take 150 mg by mouth 2 (two) times daily.    [provider]  rOPINIRole (REQUIP) 1 MG tablet Take 1 mg by mouth 4 (four) times daily.     [provider]  sennosides-docusate sodium (SENOKOT-S) 8.6-50 MG tablet Take 2 tablets by mouth at bedtime.    [provider]  spironolactone (ALDACTONE) 25 MG tablet Take 25 mg by mouth 2 (two) times daily.    [provider]  torsemide (DEMADEX) 20 MG tablet Take 4 tablets (80 mg total) by mouth daily. 12/09/16   Larey Dresser, MD  Turmeric 500 MG CAPS Take 500 mg by mouth daily.     [provider]    ALLERGIES:  Allergies  Allergen Reactions  . Hydromorphone Other (See Comments)    Hallucinations, altered mental state    SOCIAL HISTORY:  Social History  Substance Use Topics  . Smoking status: Never Smoker  . Smokeless tobacco: Never Used  . Alcohol use No    FAMILY HISTORY: Family History  Problem Relation Age of Onset  . Heart disease Mother   . Heart failure Mother   . Kidney disease Mother   . Lung cancer Father   . Colon cancer Brother   . Cancer Brother        malignant thymoma  . Heart disease Brother   . Tremor Maternal Grandmother 90  . Ovarian cancer Maternal Grandmother   . Uterine cancer Maternal Grandmother   . Irritable bowel syndrome Daughter   . Esophageal cancer Neg Hx   .  Stomach cancer Neg Hx   . Rectal cancer Neg Hx     EXAM: BP 131/61 (BP Location: Right Arm)   Pulse 67   Temp 98.1 F (36.7 C) (Oral)   Resp 18   Ht _0  (1.676 m)   Wt 116.6 kg (257 lb)   SpO2 97%   BMI 41.48 kg/m  CONSTITUTIONAL: Alert and oriented and responds appropriately to questions. Well-appearing; well-nourished; GCS 15, morbidly obese HEAD: Normocephalic; atraumatic EYES: Conjunctivae clear, PERRL, EOMI ENT: normal nose; no rhinorrhea; moist mucous membranes; pharynx without lesions noted; no dental injury; no septal hematoma NECK: Supple, no meningismus, no LAD; no midline spinal tenderness, step-off or deformity; trachea midline CARD: RRR; S1 and S2 appreciated; no murmurs, no clicks, no rubs, no gallops RESP: Normal chest excursion without splinting or tachypnea; breath sounds clear and equal bilaterally; no wheezes, no rhonchi, no rales; no hypoxia or respiratory distress CHEST:  chest wall stable, no crepitus or ecchymosis or deformity, tender over the left lateral chest wall and left anterior chest wall; no flail chest ABD/GI: Normal bowel sounds; non-distended; soft, non-tender, no rebound, no guarding; no ecchymosis or other lesions noted PELVIS:  stable, nontender to palpation BACK:  The back appears normal and is tender to palpation over the lower lumbar spine, there is no CVA tenderness; no midline spinal step-off or deformity EXT: Normal ROM in all joints;tender over bilateral shoulders in the left knee without obvious deformity, she has a well-healed surgical scar noted to the anterior left knee from previous knee replacement; no edema; normal capillary refill; no cyanosis, no bony tenderness or bony deformity of patient's extremities, no joint effusion, compartments are soft, extremities are warm and well-perfused, no ecchymosis SKIN: Normal color for age and race; warm NEURO: Moves all extremities equally, reports normal sensation diffusely, cranial nerves II  through XII intact, normal speech PSYCH: The patient's mood and manner are appropriate. Grooming and personal hygiene are appropriate.  MEDICAL DECISION MAKING: Patient here with mechanical fall. Will obtain CT imaging of her head and cervical spine given she is elderly and did fall and strike her head. We'll also obtain x-rays of both shoulders, left chest, left knee and lumbar spine. We'll give pain medication and reassess.  ED PROGRESS: Imaging shows no acute abnormality. Patient has been able to stand and walk in the bathroom. Hemodynamically stable. Plan will be to discharge home if symptoms continue she can follow-up with her outpatient orthopedic physician Dr. Rush Farmer. She states at this time that it is mostly her shoulders that are bothering her. I did discuss with her that there could be what appears to be rotator cuff pathology on the x-ray of her left shoulder. Recommend rest, ice, elevation. She has pain medication at home.  Patient ambulated with a walker which is her baseline without difficulty.   At this time, I do not feel there is any life-threatening condition present. I have reviewed and discussed all results (EKG, imaging, lab, urine as appropriate) and exam findings with patient/family. I have reviewed nursing notes and appropriate previous records.  I feel the patient is safe to be discharged home without further emergent workup and can continue workup as an outpatient as needed. Discussed usual and customary return precautions. Patient/family verbalize understanding and are comfortable with this plan.  Outpatient follow-up has been provided if needed. All questions have been answered.    Ayan Heffington, Delice Bison, DO 04/19/17 [place], Delice Bison, DO 04/19/17 [phone]

## 2017-04-29 ENCOUNTER — Encounter (INDEPENDENT_AMBULATORY_CARE_PROVIDER_SITE_OTHER): Payer: Self-pay | Admitting: Orthopaedic Surgery

## 2017-04-29 ENCOUNTER — Ambulatory Visit (INDEPENDENT_AMBULATORY_CARE_PROVIDER_SITE_OTHER): Payer: Medicare Other | Admitting: Orthopaedic Surgery

## 2017-04-29 DIAGNOSIS — Z96652 Presence of left artificial knee joint: Secondary | ICD-10-CM

## 2017-04-29 NOTE — Progress Notes (Signed)
The patient is now about 6 weeks to several weeks status post polyliner exchange from a left total knee arthroplasty that was unstable to her. She is morbidly obese. The knee replacement surgery done by another physician in town. We were able to upsize the poly. She is someone with significant deconditioning in and closed with a walker. Since I saw her last she had a mechanical fall was seen in emergency room on 04/19/2017. She had CT scans of her head and neck as well as pain films of her lumbar spine both shoulders and her left knee. I have reviewed all those did not see any acute abnormalities. She is just having significant difficulty getting around in general due to her deconditioning. She has no transportation and home health therapy at already stopped but this needs to be reinitiated.  On exam there is no acute findings I can tell from her shoulders or knees other than the chronic pain that she's been having. Both knees are stable.  We will try to set up home health physical therapy again to work on balance and gait training coordination. This is mainly due to the fact that she has no transportation means to get outpatient therapy. We'll see her back in a month to see how she doing overall.

## 2017-04-30 ENCOUNTER — Ambulatory Visit (HOSPITAL_COMMUNITY)
Admission: RE | Admit: 2017-04-30 | Discharge: 2017-04-30 | Disposition: A | Discharge status: Home / Self Care | Payer: Medicare Other | Source: Ambulatory Visit | Attending: Internal Medicine | Admitting: Internal Medicine

## 2017-04-30 DIAGNOSIS — I5032 Chronic diastolic (congestive) heart failure: Secondary | ICD-10-CM | POA: Diagnosis not present

## 2017-04-30 NOTE — Progress Notes (Cosign Needed)
   Cardiomems Update   Implant Date 11/13/2016   RHC RA 4 PA 23/10 (15)  PCWP 5 PA 76% CO 9.1 CI 4.1      Implant Labs Creatinine 2.12 BUN 58 Potassium 3.9   PAD Range 6-13 Current PAD  7   Current Labs 02/23/2017 Creatinine 2.09 BUN 64 Potassium 3.6   Recommendations  I have reviewed current PAD from cardiomems device. PAD reading stable. .  Continue current diuretic regimen.   Follow monthly    [name] Shin NP-C  1:16 PM

## 2017-05-04 ENCOUNTER — Other Ambulatory Visit (INDEPENDENT_AMBULATORY_CARE_PROVIDER_SITE_OTHER): Payer: Self-pay

## 2017-05-10 ENCOUNTER — Encounter (HOSPITAL_COMMUNITY): Payer: Self-pay | Admitting: Emergency Medicine

## 2017-05-10 ENCOUNTER — Encounter (HOSPITAL_COMMUNITY): Payer: PRIVATE HEALTH INSURANCE | Admitting: Cardiology

## 2017-05-10 ENCOUNTER — Telehealth (HOSPITAL_COMMUNITY): Payer: Self-pay | Admitting: *Deleted

## 2017-05-10 ENCOUNTER — Emergency Department (HOSPITAL_COMMUNITY): Payer: Medicare Other

## 2017-05-10 ENCOUNTER — Emergency Department (HOSPITAL_COMMUNITY)
Admission: EM | Admit: 2017-05-10 | Discharge: 2017-05-10 | Disposition: A | Discharge status: Home / Self Care | Payer: Medicare Other | Attending: Emergency Medicine | Admitting: Emergency Medicine

## 2017-05-10 DIAGNOSIS — Z79899 Other long term (current) drug therapy: Secondary | ICD-10-CM | POA: Diagnosis not present

## 2017-05-10 DIAGNOSIS — N183 Chronic kidney disease, stage 3 (moderate): Secondary | ICD-10-CM | POA: Diagnosis not present

## 2017-05-10 DIAGNOSIS — Z8673 Personal history of transient ischemic attack (TIA), and cerebral infarction without residual deficits: Secondary | ICD-10-CM | POA: Diagnosis not present

## 2017-05-10 DIAGNOSIS — S51011A Laceration without foreign body of right elbow, initial encounter: Secondary | ICD-10-CM | POA: Insufficient documentation

## 2017-05-10 DIAGNOSIS — M545 Low back pain: Secondary | ICD-10-CM | POA: Diagnosis present

## 2017-05-10 DIAGNOSIS — I130 Hypertensive heart and chronic kidney disease with heart failure and stage 1 through stage 4 chronic kidney disease, or unspecified chronic kidney disease: Secondary | ICD-10-CM | POA: Diagnosis not present

## 2017-05-10 DIAGNOSIS — W0110XA Fall on same level from slipping, tripping and stumbling with subsequent striking against unspecified object, initial encounter: Secondary | ICD-10-CM | POA: Insufficient documentation

## 2017-05-10 DIAGNOSIS — E785 Hyperlipidemia, unspecified: Secondary | ICD-10-CM | POA: Diagnosis not present

## 2017-05-10 DIAGNOSIS — E119 Type 2 diabetes mellitus without complications: Secondary | ICD-10-CM | POA: Insufficient documentation

## 2017-05-10 DIAGNOSIS — Y939 Activity, unspecified: Secondary | ICD-10-CM | POA: Diagnosis not present

## 2017-05-10 DIAGNOSIS — Y998 Other external cause status: Secondary | ICD-10-CM | POA: Insufficient documentation

## 2017-05-10 DIAGNOSIS — G8929 Other chronic pain: Secondary | ICD-10-CM

## 2017-05-10 DIAGNOSIS — I509 Heart failure, unspecified: Secondary | ICD-10-CM | POA: Diagnosis not present

## 2017-05-10 DIAGNOSIS — W19XXXA Unspecified fall, initial encounter: Secondary | ICD-10-CM

## 2017-05-10 DIAGNOSIS — Y92000 Kitchen of unspecified non-institutional (private) residence as  the place of occurrence of the external cause: Secondary | ICD-10-CM | POA: Diagnosis not present

## 2017-05-10 DIAGNOSIS — Z7982 Long term (current) use of aspirin: Secondary | ICD-10-CM | POA: Diagnosis not present

## 2017-05-10 DIAGNOSIS — S41111A Laceration without foreign body of right upper arm, initial encounter: Secondary | ICD-10-CM

## 2017-05-10 MED ORDER — FENTANYL CITRATE (PF) 100 MCG/2ML IJ SOLN
50.0000 ug | Freq: Once | INTRAMUSCULAR | Status: AC
  Administered 2017-05-10: 50 ug via INTRAMUSCULAR
  Filled 2017-05-10: qty 2

## 2017-05-10 MED ORDER — HYDROCODONE-ACETAMINOPHEN 10-325 MG PO TABS
1.0000 | ORAL_TABLET | Freq: Once | ORAL | Status: AC
  Administered 2017-05-10: 1 via ORAL
  Filled 2017-05-10: qty 1

## 2017-05-10 MED ORDER — LIDOCAINE-EPINEPHRINE (PF) 2 %-1:200000 IJ SOLN
10.0000 mL | Freq: Once | INTRAMUSCULAR | Status: AC
  Administered 2017-05-10: 10 mL
  Filled 2017-05-10: qty 20

## 2017-05-10 NOTE — ED Notes (Signed)
Placed Purewick cath per [facility])

## 2017-05-10 NOTE — ED Notes (Signed)
Ice given to pt per Tiffany (PA)

## 2017-05-10 NOTE — ED Notes (Signed)
Fentanyl patch fell off of right arm while pt was in bed. She reports it is not good. This RN disposed of it in the sharps bin witnesses. Present.

## 2017-05-10 NOTE — Telephone Encounter (Signed)
Threshold: 6-13  Reading: 3 on 05/09/17  Instructions:  Hold Torsemide today and tomorrow per Darrick Grinder, NP  Pt was in ER this AM so waited and called her this afternoon, pt's daughter answered the phone.  She states once they got home from [facility] ER pt had wet herself and did not realize it and then she could not lift her R leg, she states pt just did not seem right her so she call 911 and pt is currently being transported to [place] her to keep [place] posted on pt and let us know when she gets back home, she is agreeable

## 2017-05-10 NOTE — ED Provider Notes (Signed)
Patient seen/examined in the Emergency Department in conjunction with Midlevel Provider [place] Patient reports after mechanical fall. She has h/o chronic pain Exam : awake/alert, appears uncomfortable, she has tenderness to right elbow/humerus Pelvis is stable Plan: imaging ordered Pt is awake/alert/appropriate, denies head injury at this time     Ripley Fraise, MD 05/10/17 [phone]

## 2017-05-10 NOTE — ED Triage Notes (Signed)
BIB EMS from home.  Pt c/o back and R arm pain after a fall.  Pt has 1-inch lac to R elbow.  Pt sts she slipped and fell on water in her kitchen.  Pt reports hitting her head. 6/10 pain 160/74 63 HR 18 RR 94% RA CBG 89

## 2017-05-10 NOTE — ED Notes (Signed)
Patient transported to X-ray 

## 2017-05-10 NOTE — ED Provider Notes (Signed)
[facility] DEPT Provider Note   CSN: [id number] Arrival date & time: 05/10/17  0534     History   Chief Complaint Chief Complaint  Patient presents with  . Fall  . Back Pain  . Arm Pain    HPI ZARAI ORSBORN is a 71 y.o. female.  Patient with history of CAD, CHF, depression, chronic back pain under Pain Mgmt, lymphedema, T2DM, HTN, HLD presents after mechanical fall in her kitchen after slipping on wet surface. She fell flat onto back causing wound to right elbow and upper arm/elbow pain, neck discomfort, low back pain. She hit her head on the floor but there was no LOC. She is not anticoagulated. She has been ambulatory with EMS since the fall and denies LE or pelvic pain.    The history is provided by the patient. No language interpreter was used.    Past Medical History:  Diagnosis Date  . Anemia   . Anxiety   . Arthritis    "hands, feet" (03/03/2017)  . Brain lesion    2 types  . Cervical spondylosis with myelopathy   . Chest pain    Normal cardiac cath 5/09  . Chronic lower back pain   . CKD (chronic kidney disease), stage III   . Congestive heart failure (CHF) ([place])   . Constipation   . Depression   . Dumping syndrome   . Dyspnea    with activity  . Edema   . Fatigue   . Fibromyalgia   . GERD (gastroesophageal reflux disease)   . Headache    "w/high CBG" (03/03/2017)  . History of echocardiogram    a. Echo 03/20/16 (done at [facility] in [place], [place]):  mild LVH, EF 81%, normal diastolic function, mild LAE, MAC, RVSP 25 mmHg  . Hyperlipidemia   . Hypertension   . Hypothyroidism   . Lumbar spondylosis   . Lymphedema    seeing specialist for this  . Migraine    "mostly stopped when I changed my diet" (03/03/2017)  . OSA on CPAP   . Pneumonia X 1  . Restless leg syndrome   . Stroke [facility])    TIAs "mini strokes"- unsure of last TIA  . Syncope   . Tremors of nervous system   . Type II diabetes mellitus [facility])     Patient Active Problem List     Diagnosis Date Noted  . Status post revision of total knee replacement, left 04/29/2017  . Polyethylene liner wear following left total knee arthroplasty requiring isolated polyethylene liner exchange ([place]) 03/02/2017  . 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 Unstable angina ([place]) 05/21/2016  . Chronic diastolic heart failure ([place]) 03/29/2016  . Normal coronary arteries 2009 01/14/2015  . Obesity-BMI 40 01/14/2015  . Restless leg 01/14/2015  . Chest pain 01/14/2015  . Back pain 01/14/2015  . Renal insufficiency 01/14/2015  . Dementia- mild memory issues 01/14/2015  . Occult blood positive stool 12/14/2014  . Anemia 12/14/2014  . Family history of colon cancer 12/14/2014  . Change in bowel habits 12/14/2014  . GERD (gastroesophageal reflux disease) 12/14/2014  . Dyspnea 05/17/2012  . Lymphedema 05/17/2012  . Weight gain 05/17/2012  . HTN (hypertension) 05/17/2012    Past Surgical History:  Procedure Laterality Date  . APPENDECTOMY  1966  . BACK SURGERY    . CARDIAC CATHETERIZATION N/A 05/25/2016   Procedure: Right/Left Heart Cath and Coronary Angiography;  Surgeon: Larey Dresser, MD;  Location: [facility]  CV LAB;  Service: Cardiovascular;  Laterality: N/A;  . CATARACT EXTRACTION W/ INTRAOCULAR LENS  IMPLANT, BILATERAL Bilateral   . COLONOSCOPY    . CORONARY ANGIOGRAM  2009   Normal coronaries  . EXCISION/RELEASE BURSA HIP Bilateral   . I&D KNEE WITH POLY EXCHANGE Left 03/02/2017   Procedure: Left knee Revision of poly liner;  Surgeon: Mcarthur Rossetti, MD;  Location: [facility];  Service: Orthopedics;  Laterality: Left;  . JOINT REPLACEMENT    . KNEE ARTHROSCOPY Bilateral   . LAPAROSCOPIC CHOLECYSTECTOMY  2015  . LUMBAR DISC SURGERY    . LUMBAR LAMINECTOMY/DECOMPRESSION MICRODISCECTOMY Left 12/2005   L2-3 laminectomy and diskectomy/notes 01/06/2011  . POSTERIOR LUMBAR FUSION  04/2006   [name] 01/06/2011; "put cages in"  . REVISION TOTAL  KNEE ARTHROPLASTY Left 03/02/2017   poly liner/notes 03/02/2017  . RIGHT HEART CATH N/A 11/13/2016   Procedure: Right Heart Cath with Cardiomems;  Surgeon: Larey Dresser, MD;  Location: [facility] CV LAB;  Service: Cardiovascular;  Laterality: N/A;  . SHOULDER ARTHROSCOPY WITH ROTATOR CUFF REPAIR Right   . SHOULDER OPEN ROTATOR CUFF REPAIR Left 01/2011   [name] 01/29/2011  . TOTAL ABDOMINAL HYSTERECTOMY    . TOTAL KNEE ARTHROPLASTY Left   . TOTAL KNEE ARTHROPLASTY Right 11/18/2012   Procedure: TOTAL KNEE ARTHROPLASTY;  Surgeon: Yvette Rack., MD;  Location: [facility];  Service: Orthopedics;  Laterality: Right;  . TUBAL LIGATION    . UPPER GI ENDOSCOPY      OB History    No data available       Home Medications    Prior to Admission medications   Medication Sig Start Date End Date Taking? Authorizing Provider  alendronate (FOSAMAX) 70 MG tablet Take 70 mg by mouth every Friday. Take with a full glass of water on an empty stomach.     [provider]  amLODipine (NORVASC) 10 MG tablet Take 1 tablet (10 mg total) by mouth daily. 10/20/16   Darrick Grinder D, NP  aspirin EC 325 MG EC tablet Take 1 tablet (325 mg total) by mouth 2 (two) times daily after a meal. 03/04/17   Carlis Abbott, Gillermo Murdoch, PA-C  baclofen (LIORESAL) 10 MG tablet Take 10 mg by mouth 3 (three) times daily.    [provider]  Cyanocobalamin (VITAMIN B-12 IJ) Inject as directed See admin instructions. Weekly for 4 weeks, has 1 more dose this week (week of 7/2) then will switch to once a month    [provider]  donepezil (ARICEPT) 10 MG tablet Take 10 mg by mouth at bedtime.  11/28/14   [provider]  DULoxetine (CYMBALTA) 20 MG capsule Take 20 mg by mouth at bedtime.  11/09/16   [provider]  escitalopram (LEXAPRO) 20 MG tablet Take 30 mg by mouth daily with supper.  12/06/14   [provider]  fentaNYL (DURAGESIC - DOSED MCG/HR) 25 MCG/HR patch Place 25 mcg onto the skin every 3  (three) days.  07/22/15   [provider]  folic acid (FOLVITE) 1 MG tablet Take 1 mg by mouth 2 (two) times daily.    [provider]  gabapentin (NEURONTIN) 100 MG capsule Take 100 mg by mouth at bedtime.    [provider]  glipiZIDE (GLUCOTROL XL) 2.5 MG 24 hr tablet Take 2.5 mg by mouth daily. 01/19/17   [provider]  HYDROcodone-acetaminophen (NORCO/VICODIN) 5-325 MG tablet Take 1 tablet by mouth every 6 (six) hours as needed  for moderate pain.    [provider]  lamoTRIgine (LAMICTAL) 100 MG tablet Take 100 mg by mouth 2 (two) times daily.  11/03/14   [provider]  levothyroxine (SYNTHROID, LEVOTHROID) 75 MCG tablet Take 75 mcg by mouth daily before breakfast.    [provider]  memantine (NAMENDA) 10 MG tablet Take 10 mg by mouth 2 (two) times daily. 04/06/16   [provider]  metolazone (ZAROXOLYN) 2.5 MG tablet Take 1 tablet (2.5 mg total) by mouth once a week. 12/09/16   Larey Dresser, MD  oxyCODONE-acetaminophen (ROXICET) 5-325 MG tablet Take 1-2 tablets by mouth every 4 (four) hours as needed for severe pain. Patient not taking: Reported on 04/29/2017 03/04/17   Pete Pelt, PA-C  pravastatin (PRAVACHOL) 80 MG tablet Take 80 mg by mouth at bedtime.  12/10/14   [provider]  ranitidine (ZANTAC) 150 MG tablet Take 150 mg by mouth 2 (two) times daily.    [provider]  rOPINIRole (REQUIP) 1 MG tablet Take 1 mg by mouth 4 (four) times daily.     [provider]  sennosides-docusate sodium (SENOKOT-S) 8.6-50 MG tablet Take 2 tablets by mouth at bedtime.    [provider]  spironolactone (ALDACTONE) 25 MG tablet Take 25 mg by mouth 2 (two) times daily.    [provider]  torsemide (DEMADEX) 20 MG tablet Take 4 tablets (80 mg total) by mouth daily. 12/09/16   Larey Dresser, MD  Turmeric 500 MG CAPS Take 500 mg by mouth daily.     [provider]     Family History Family History  Problem Relation Age of Onset  . Heart disease Mother   . Heart failure Mother   . Kidney disease Mother   . Lung cancer Father   . Colon cancer Brother   . Cancer Brother        malignant thymoma  . Heart disease Brother   . Tremor Maternal Grandmother 90  . Ovarian cancer Maternal Grandmother   . Uterine cancer Maternal Grandmother   . Irritable bowel syndrome Daughter   . Esophageal cancer Neg Hx   . Stomach cancer Neg Hx   . Rectal cancer Neg Hx     Social History Social History  Substance Use Topics  . Smoking status: Never Smoker  . Smokeless tobacco: Never Used  . Alcohol use No     Allergies   Hydromorphone   Review of Systems Review of Systems  Constitutional: Negative for chills and diaphoresis.  Respiratory: Negative.  Negative for shortness of breath.   Cardiovascular: Negative.  Negative for chest pain.  Gastrointestinal: Negative.  Negative for abdominal pain and vomiting.  Musculoskeletal: Positive for back pain.       See HPI.  Skin: Positive for wound (Right elbow).  Neurological: Negative.  Negative for syncope and weakness.  Psychiatric/Behavioral: Negative for confusion.     Physical Exam Updated Vital Signs BP (!) 140/55 (BP Location: Left Arm)   Pulse 68   Temp 97.6 F (36.4 C) (Oral)   Resp 18   Ht _0  (1.676 m)   Wt 114.8 kg (253 lb)   SpO2 99%   BMI 40.84 kg/m   Physical Exam  Constitutional: She is oriented to person, place, and time. She appears well-developed and well-nourished. No distress.  Morbidly obese patient in NAD.  HENT:  Head: Normocephalic and atraumatic.  Eyes: Pupils are equal, round, and reactive to light.  Neck:  Normal range of motion. Neck supple.  Cardiovascular: Normal rate.   No murmur heard. Pulmonary/Chest: Effort normal. She has no wheezes. She has no rales. She exhibits no tenderness.  Abdominal: Soft. There is no tenderness.  Musculoskeletal:  Right UE  tender over length of humerus and elbow. FROM all joints of UE's. There is mild midline cervical tenderness. No pelvic or LE tenderness. FROM. No strength deficits.   Neurological: She is alert and oriented to person, place, and time.  Skin:  Bandaged wound to right elbow.     ED Treatments / Results  Labs (all labs ordered are listed, but only abnormal results are displayed) Labs Reviewed - No data to display  EKG  EKG Interpretation None       Radiology No results found.  Procedures Procedures (including critical care time)  Medications Ordered in ED Medications  HYDROcodone-acetaminophen (NORCO) 10-325 MG per tablet 1 tablet (not administered)     Initial Impression / Assessment and Plan / ED Course  I have reviewed the triage vital signs and the nursing notes.  Pertinent labs & imaging results that were available during my care of the patient were reviewed by me and considered in my medical decision making (see chart for details).     Patient here after mechanical fall with complaint of RUE, low back and neck pain, with wound to right elbow. No LOC. Not anticoagulated. Tetanus UTD.  Patient seen initially by me with Dr. Christy Gentles. Imaging ordered. Patient care signed out to Delos Haring, PA-C, pending imaginag review and wound exam and repair, if needed.  Final Clinical Impressions(s) / ED Diagnoses   Final diagnoses:  None   1. Fall   New Prescriptions New Prescriptions   No medications on file     Charlann Lange, Hershal Coria 05/10/17 [id number]    Ripley Fraise, MD 05/10/17 [phone]

## 2017-05-10 NOTE — ED Provider Notes (Signed)
Patient received from [facility], [place]. She had a mechanical fall from standing position. She has a 2 inch laceration to her right elbow that will need repair. Patient tells me she has taken 5 steps since after she fell. She has chronic back pain.    DG Cervical Spine Complete (Final result)  Result time 05/10/17 07:09:40  Final result by Rolm Baptise, MD (05/10/17 07:09:40)           Narrative:   CLINICAL DATA: Fall, posterior neck pain.  EXAM: CERVICAL SPINE - COMPLETE 4+ VIEW  COMPARISON: 04/19/2017  FINDINGS: Degenerative disc disease at C5-6 and C6-7 with disc space narrowing and spurring. Diffuse degenerative facet disease. No fracture or subluxation. Prevertebral soft tissues are normal.  IMPRESSION: Degenerative changes. No acute findings.   Electronically Signed By: Rolm Baptise M.D. On: 05/10/2017 07:09            DG Humerus Right (Final result)  Result time 05/10/17 07:10:11  Final result by Rolm Baptise, MD (05/10/17 07:10:11)           Narrative:   CLINICAL DATA: Fall. Right upper arm pain.  EXAM: RIGHT HUMERUS - 2+ VIEW  COMPARISON: 04/19/2017  FINDINGS: Changes of prior resection of the distal right clavicle, stable. Joint space narrowing within the glenohumeral joint. No acute bony abnormality. Specifically, no fracture, subluxation, or dislocation. Soft tissues are intact.  IMPRESSION: No acute bony abnormality.   Electronically Signed By: Rolm Baptise M.D. On: 05/10/2017 07:10            DG Elbow Complete Right (Final result)  Result time 05/10/17 07:10:50  Final result by Rolm Baptise, MD (05/10/17 07:10:50)           Narrative:   CLINICAL DATA: Fall today. Right elbow pain.  EXAM: RIGHT ELBOW - COMPLETE 3+ VIEW  COMPARISON: None.  FINDINGS: Degenerative changes in the right elbow with spurring. No joint effusion. No acute fracture, subluxation or  dislocation.  IMPRESSION: Degenerative changes. No acute bony abnormality.   Electronically Signed By: Rolm Baptise M.D. On: 05/10/2017 07:10            DG Lumbar Spine Complete (Final result)  Result time 05/10/17 07:11:45  Final result by Rolm Baptise, MD (05/10/17 07:11:45)           Narrative:   CLINICAL DATA: Fall, low back pain.  EXAM: LUMBAR SPINE - COMPLETE 4+ VIEW  COMPARISON: 04/19/2017  FINDINGS: Postsurgical changes at L2-3 with interbody spacers. Degenerative disc and facet disease throughout the lumbar spine. No fracture or malalignment. SI joints are symmetric and unremarkable.  IMPRESSION: Postoperative and degenerative changes, stable. No acute bony abnormality.   Electronically Signed By: Rolm Baptise M.D. On: 05/10/2017 07:11           LACERATION REPAIR Performed by: Linus Mako Authorized by: Linus Mako Consent: Verbal consent obtained. Risks and benefits: risks, benefits and alternatives were discussed Consent given by: patient Patient identity confirmed: provided demographic data Prepped and Draped in normal sterile fashion Wound explored  Laceration Location: right elbow  Laceration Length: 3 cm  No Foreign Bodies seen or palpated  Anesthesia: local infiltration  Local anesthetic: lidocaine 2%  with epinephrine  Anesthetic total: 10 ml  Irrigation method: syringe Amount of cleaning: standard  Skin closure: 5'0 prolesen  Number of sutures: 4  Technique: simple interrutped  Patient tolerance: Patient tolerated the procedure well with no immediate complications.   The patients images have resulted in with no significant abnormalities. She says  her PCP is trying to get her into a rehab facility to get stronger then he wants to send her to assisted living. She said she was told that its easier to get placed from the hospital and requests placement. Unfortunately, placement into a nursing  facility requires a few day admission and she does not meet criteria for admission.  Patient given her home dose of Hydrocdoone but she is still crying in pain, will give a small injection of Fentanyl to help bridge her to her pain pill.  The patient has been evaluated and no emergency medical condition that requires further evaluation is present. I have discussed return precautions and s/sx that require a return visit to the emergency department for further evaluation. Patient has been advised to follow-up closely with her primary care provider or the referred specialist.    Delos Haring, PA-C 05/10/17 0740    Delos Haring, PA-C 05/10/17 [id number]    Carmin Muskrat, MD 05/10/17 1615

## 2017-05-17 ENCOUNTER — Ambulatory Visit (INDEPENDENT_AMBULATORY_CARE_PROVIDER_SITE_OTHER): Payer: Medicare Other | Admitting: Orthopaedic Surgery

## 2017-05-19 ENCOUNTER — Telehealth (INDEPENDENT_AMBULATORY_CARE_PROVIDER_SITE_OTHER): Payer: Self-pay | Admitting: Orthopaedic Surgery

## 2017-05-19 NOTE — Telephone Encounter (Signed)
IC and s/w daughter, appt moved to tomorrow 3pm with CB

## 2017-05-20 ENCOUNTER — Ambulatory Visit (INDEPENDENT_AMBULATORY_CARE_PROVIDER_SITE_OTHER): Payer: Medicare Other | Admitting: Orthopaedic Surgery

## 2017-05-20 DIAGNOSIS — Z96652 Presence of left artificial knee joint: Secondary | ICD-10-CM

## 2017-05-20 NOTE — Progress Notes (Signed)
The patient is in follow-up almost 80 days status post a left knee polyliner exchange. Her knee was replaced years ago by someone else in town and she needed upsizing of her poly-liner due to instability that knee. She is someone on chronic pain medications and is staying in a skilled nursing facility for rehabilitation. She's had a recent fall with a laceration to her right elbow. The sutures have been removed from that. She's been in a knee brace on her left knee visit felt that she may have had a quad tendon strain. She's had difficulty with that knee ever since surgery but even preoperative is well. She also has chronic pain in her right shoulder with a rotator cuff deficient shoulder. She says that limits her more knee pain.  On exam she is morbidly obese individual however her shoulders are very thin. I take the brace off of her left knee and she still has weakness of that knee but I do not feel he deficits in terms of the patella tendon and quad tendon and recent x-rays after a fall showed the implant to be well seated with no change in position of the patella itself. Her right shoulder shows limitations in range of motion mainly significant weakness with a known rotator cuff arthropathy.  At this point I want her to continue to work on balance correlation strengthening of her lower extremities. I'll see her back myself in a month to see how she is doing overall is far as her knee goes a we'll see if we can get up on with her to see my partner Dr. Marlou Sa to assess her right shoulder for the potential for shoulder replacement surgery.

## 2017-05-27 ENCOUNTER — Ambulatory Visit (INDEPENDENT_AMBULATORY_CARE_PROVIDER_SITE_OTHER): Payer: Medicare Other | Admitting: Orthopaedic Surgery

## 2017-05-27 ENCOUNTER — Encounter (INDEPENDENT_AMBULATORY_CARE_PROVIDER_SITE_OTHER): Payer: Self-pay | Admitting: Orthopedic Surgery

## 2017-05-27 ENCOUNTER — Ambulatory Visit (INDEPENDENT_AMBULATORY_CARE_PROVIDER_SITE_OTHER): Payer: Medicare Other | Admitting: Orthopedic Surgery

## 2017-05-27 DIAGNOSIS — M19019 Primary osteoarthritis, unspecified shoulder: Secondary | ICD-10-CM

## 2017-05-29 NOTE — Progress Notes (Signed)
Office Visit Note   Patient: [name]           Date of Birth: 04/29/1946           MRN: [id number] Visit Date: 05/27/2017 Requested by: [name] Housekeeper, MD [address] [place], [place] [id number] PCP: [name] Housekeeper, MD  Subjective: Chief Complaint  Patient presents with  . Shoulder Pain    bilateral pain left worse than right     HPI: [name] is a 71 year old patient with bilateral shoulder pain left worse than right.  She's had multiple fallsrecently 3 weeks ago.  She reports chronic left shoulder pain and injury.  She in place with a walker and she does weight-bear some through that left shoulder when she is getting up from a seated position.  She is right-hand dominant.  The pain will wake her from sleep tonight.  She was just discharged from the facility.  She has been getting physical therapy 5 days a week.  She is on a fentanyl patch 25 mg over 72 hours along with Neurontin and Norco 3 per day.  She wants to live independently at home.  She has an assisted-living option.  Her caregiver states that she may have some lesions in her brain consistent with early dementia.  She also has stage III kidney failure and is on Plavix.  She also has chronic congestive heart failure.  She has tried multiple injections in the left shoulder which only helped for 1-2 days.  Her caregiver believes that her falls are related to medication issues              ROS: All systems reviewed are negative as they relate to the chief complaint within the history of present illness.  Patient denies  fevers or chills.   Assessment & Plan: Visit Diagnoses:  1. Shoulder arthritis     Plan: Impression is left shoulder rotator cuff arthropathy.  Patient has multiple other medical comorbidities and problems which would necessarily affect her recovery and surgery.  Nonetheless she is fairly adamant about improving her current shoulder situation to move on with the next stage of her life.  I like to get  thin cut CT scan as preoperative planning for patient's specific instrumentation for possible reverse shoulder replacement.  Patient does have functioning deltoid.  Based on her current pain medical requirements she may have a difficult time with pain after surgery and this is discussed with the patient.  I'll see her back after the CT scan.  Follow-Up Instructions: No Follow-up on file.   Orders:  Orders Placed This Encounter  Procedures  . CT SHOULDER LEFT WO CONTRAST   No orders of the defined types were placed in this encounter.     Procedures: No procedures performed   Clinical Data: No additional findings.  Objective: Vital Signs: There were no vitals taken for this visit.  Physical Exam:   Constitutional: Patient appears well-developed HEENT:  Head: Normocephalic Eyes:EOM are normal Neck: Normal range of motion Cardiovascular: Normal rate Pulmonary/chest: Effort normal Neurologic: Patient is alert Skin: Skin is warm Psychiatric: Patient has normal mood and affect    Ortho Exam: Orthopedic exam demonstrates increased body mass index.  She does weight-bear through both shoulders when getting up from the seated position.  She walks with a walker but has less weightbearing through the arms when she is walking with a walker.  She has palpable radial pulses bilaterally.  Deltoid is functional on the left but she has pretty  significant rotator cuff strength weakness.  Forward flexion and abduction both less than 90 on the left-hand side.  She does have more than 90 of forward flexion and abduction on the right.  Radial pulses intact bilaterally.  No paresthesias C5 T1 and she has reasonable cervical spine range of motion.  No other masses lymph adenopathy or skin changes noted in the left shoulder region  Specialty Comments:  No specialty comments available.  Imaging: No results found.   PMFS History: Patient Active Problem List   Diagnosis Date Noted  . Status  post revision of total knee replacement, left 04/29/2017  . Polyethylene liner wear following left total knee arthroplasty requiring isolated polyethylene liner exchange ([place]) 03/02/2017  . 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 Unstable angina ([place]) 05/21/2016  . Chronic diastolic heart failure ([place]) 03/29/2016  . Normal coronary arteries 2009 01/14/2015  . Obesity-BMI 40 01/14/2015  . Restless leg 01/14/2015  . Chest pain 01/14/2015  . Back pain 01/14/2015  . Renal insufficiency 01/14/2015  . Dementia- mild memory issues 01/14/2015  . Occult blood positive stool 12/14/2014  . Anemia 12/14/2014  . Family history of colon cancer 12/14/2014  . Change in bowel habits 12/14/2014  . GERD (gastroesophageal reflux disease) 12/14/2014  . Dyspnea 05/17/2012  . Lymphedema 05/17/2012  . Weight gain 05/17/2012  . HTN (hypertension) 05/17/2012   Past Medical History:  Diagnosis Date  . Anemia   . Anxiety   . Arthritis    "hands, feet" (03/03/2017)  . Brain lesion    2 types  . Cervical spondylosis with myelopathy   . Chest pain    Normal cardiac cath 5/09  . Chronic lower back pain   . CKD (chronic kidney disease), stage III ([place])   . Congestive heart failure (CHF) ([place])   . Constipation   . Depression   . Dumping syndrome   . Dyspnea    with activity  . Edema   . Fatigue   . Fibromyalgia   . GERD (gastroesophageal reflux disease)   . Headache    "w/high CBG" (03/03/2017)  . History of echocardiogram    a. Echo 03/20/16 (done at [facility] in [place], [place]):  mild LVH, EF 09%, normal diastolic function, mild LAE, MAC, RVSP 25 mmHg  . Hyperlipidemia   . Hypertension   . Hypothyroidism   . Lumbar spondylosis   . Lymphedema    seeing specialist for this  . Migraine    "mostly stopped when I changed my diet" (03/03/2017)  . OSA on CPAP   . Pneumonia X 1  . Restless leg syndrome   . Stroke [facility])    TIAs "mini strokes"- unsure of last TIA  .  Syncope   . Tremors of nervous system   . Type II diabetes mellitus (HCC)     Family History  Problem Relation Age of Onset  . Heart disease Mother   . Heart failure Mother   . Kidney disease Mother   . Lung cancer Father   . Colon cancer Brother   . Cancer Brother        malignant thymoma  . Heart disease Brother   . Tremor Maternal Grandmother 90  . Ovarian cancer Maternal Grandmother   . Uterine cancer Maternal Grandmother   . Irritable bowel syndrome Daughter   . Esophageal cancer Neg Hx   . Stomach cancer Neg Hx   . Rectal cancer Neg Hx     Past Surgical  History:  Procedure Laterality Date  . APPENDECTOMY  1966  . BACK SURGERY    . CARDIAC CATHETERIZATION N/A 05/25/2016   Procedure: Right/Left Heart Cath and Coronary Angiography;  Surgeon: Larey Dresser, MD;  Location: [facility];  Service: Cardiovascular;  Laterality: N/A;  . CATARACT EXTRACTION W/ INTRAOCULAR LENS  IMPLANT, BILATERAL Bilateral   . COLONOSCOPY    . CORONARY ANGIOGRAM  2009   Normal coronaries  . EXCISION/RELEASE BURSA HIP Bilateral   . I&D KNEE WITH POLY EXCHANGE Left 03/02/2017   Procedure: Left knee Revision of poly liner;  Surgeon: Mcarthur Rossetti, MD;  Location: [facility];  Service: Orthopedics;  Laterality: Left;  . JOINT REPLACEMENT    . KNEE ARTHROSCOPY Bilateral   . LAPAROSCOPIC CHOLECYSTECTOMY  2015  . LUMBAR DISC SURGERY    . LUMBAR LAMINECTOMY/DECOMPRESSION MICRODISCECTOMY Left 12/2005   L2-3 laminectomy and diskectomy/notes 01/06/2011  . POSTERIOR LUMBAR FUSION  04/2006   [name] 01/06/2011; "put cages in"  . REVISION TOTAL KNEE ARTHROPLASTY Left 03/02/2017   poly liner/notes 03/02/2017  . RIGHT HEART CATH N/A 11/13/2016   Procedure: Right Heart Cath with Cardiomems;  Surgeon: Larey Dresser, MD;  Location: [facility];  Service: Cardiovascular;  Laterality: N/A;  . SHOULDER ARTHROSCOPY WITH ROTATOR CUFF REPAIR Right   . SHOULDER OPEN ROTATOR CUFF REPAIR Left 01/2011    [name] 01/29/2011  . TOTAL ABDOMINAL HYSTERECTOMY    . TOTAL KNEE ARTHROPLASTY Left   . TOTAL KNEE ARTHROPLASTY Right 11/18/2012   Procedure: TOTAL KNEE ARTHROPLASTY;  Surgeon: Yvette Rack., MD;  Location: [facility];  Service: Orthopedics;  Laterality: Right;  . TUBAL LIGATION    . UPPER GI ENDOSCOPY     Social History   Occupational History  . retired    Social History Main Topics  . Smoking status: Never Smoker  . Smokeless tobacco: Never Used  . Alcohol use No  . Drug use: No  . Sexual activity: No

## 2017-05-31 ENCOUNTER — Other Ambulatory Visit: Payer: Medicare Other

## 2017-05-31 ENCOUNTER — Ambulatory Visit
Admission: RE | Admit: 2017-05-31 | Discharge: 2017-05-31 | Disposition: A | Discharge status: Home / Self Care | Payer: Medicare Other | Source: Ambulatory Visit | Attending: Orthopedic Surgery | Admitting: Orthopedic Surgery

## 2017-05-31 DIAGNOSIS — M19019 Primary osteoarthritis, unspecified shoulder: Secondary | ICD-10-CM

## 2017-06-03 ENCOUNTER — Other Ambulatory Visit (INDEPENDENT_AMBULATORY_CARE_PROVIDER_SITE_OTHER): Payer: Self-pay | Admitting: Orthopedic Surgery

## 2017-06-03 ENCOUNTER — Ambulatory Visit (INDEPENDENT_AMBULATORY_CARE_PROVIDER_SITE_OTHER): Payer: No Typology Code available for payment source | Admitting: Orthopedic Surgery

## 2017-06-03 ENCOUNTER — Encounter (INDEPENDENT_AMBULATORY_CARE_PROVIDER_SITE_OTHER): Payer: Self-pay | Admitting: Orthopedic Surgery

## 2017-06-03 DIAGNOSIS — M19012 Primary osteoarthritis, left shoulder: Secondary | ICD-10-CM

## 2017-06-03 DIAGNOSIS — M19019 Primary osteoarthritis, unspecified shoulder: Secondary | ICD-10-CM | POA: Diagnosis not present

## 2017-06-04 NOTE — Progress Notes (Signed)
Office Visit Note   Patient: [name]           Date of Birth: [birth year]           MRN: [id number] Visit Date: 06/03/2017 Requested by: Dione Housekeeper, MD [address] [place], [place] [address] PCP: Dione Housekeeper, MD  Subjective: Chief Complaint  Patient presents with  . Left Shoulder - Follow-up    HPI: [name] is a 71 year old patient with severe left shoulder pain.  Since I have seen her she has had an MRI scan which shows reasonable rotator cuff and significant acromioclavicular and glenohumeral joint arthritis.  She reports continued daily symptoms and inability to use the left arm for functional activity due to pain.  She is use her getting up froe chair this is at times a weightbearing shoulder.she denies any recent fevers or chills.  She reports inability to get the arm up over her head.              ROS: All systems reviewed are negative as they relate to the chief complaint within the history of present illness.  Patient denies  fevers or chills.   Assessment & Plan: Visit Diagnoses:  1. Shoulder arthritis     Plan: impression is left shoulder arthritis in a 71 year old patient. Rotator cuff looks intact on CT scan but in general she is having difficulty with functional rotator cuff movement of the arm.  Options for Myla would be observation versus injections which she has had a lot of in the past.  Alternatively total shoulder replacement versus reverse shoulder replacement is an option.  She would like to proceed with shoulder replacement.  Discussed the risks and benefits of shoulder replacement including not limited to infection or vessel damage dislocation as well as potential need for more surgery.  Patient understands the risks and benefits and wishes to proceed.  In her particular case in order to avoid potential revision if cuff failure occurs I think reverse replacement would be her best option. We would definitely obtain inferior inclination due to  the weight bearing nature of the shoulder.  Patient understands risks and benefits.  All questions answered.  Follow-Up Instructions: No Follow-up on file.   Orders:  No orders of the defined types were placed in this encounter.  No orders of the defined types were placed in this encounter.     Procedures: No procedures performed   Clinical Data: No additional findings.  Objective: Vital Signs: There were no vitals taken for this visit.  Physical Exam:   Constitutional: Patient appears well-developed HEENT:  Head: Normocephalic Eyes:EOM are normal Neck: Normal range of motion Cardiovascular: Normal rate Pulmonary/chest: Effort normal Neurologic: Patient is alert Skin: Skin is warm Psychiatric: Patient has normal mood and affect    Ortho Exam: orthopedic exam demonstrates antalgic gait to the left.  She does use her shoulder in an extended position to get herself out of the chair.  She has reasonable passive motion but pain with passive motion.  Does have a little supraspinatus weakness on the left compared to the right.  Not much in the way of forward flexion and abduction past 90.  Motor sens  Specialty Comments:  No specialty comments available.  Imaging: No results found.   PMFS History: Patient Active Problem List   Diagnosis Date Noted  . Status post revision of total knee replacement, left 04/29/2017  . Polyethylene liner wear following left total knee arthroplasty requiring isolated polyethylene liner exchange ([place]) 03/02/2017  .  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 Unstable angina ([place]) 05/21/2016  . Chronic diastolic heart failure ([place]) 03/29/2016  . Normal coronary arteries 2009 01/14/2015  . Obesity-BMI 40 01/14/2015  . Restless leg 01/14/2015  . Chest pain 01/14/2015  . Back pain 01/14/2015  . Renal insufficiency 01/14/2015  . Dementia- mild memory issues 01/14/2015  . Occult blood positive stool 12/14/2014  .  Anemia 12/14/2014  . Family history of colon cancer 12/14/2014  . Change in bowel habits 12/14/2014  . GERD (gastroesophageal reflux disease) 12/14/2014  . Dyspnea 05/17/2012  . Lymphedema 05/17/2012  . Weight gain 05/17/2012  . HTN (hypertension) 05/17/2012   Past Medical History:  Diagnosis Date  . Anemia   . Anxiety   . Arthritis    "hands, feet" (03/03/2017)  . Brain lesion    2 types  . Cervical spondylosis with myelopathy   . Chest pain    Normal cardiac cath 5/09  . Chronic lower back pain   . CKD (chronic kidney disease), stage III ([place])   . Congestive heart failure (CHF) ([place])   . Constipation   . Depression   . Dumping syndrome   . Dyspnea    with activity  . Edema   . Fatigue   . Fibromyalgia   . GERD (gastroesophageal reflux disease)   . Headache    "w/high CBG" (03/03/2017)  . History of echocardiogram    a. Echo 03/20/16 (done at [facility] in [place], [place]):  mild LVH, EF 89%, normal diastolic function, mild LAE, MAC, RVSP 25 mmHg  . Hyperlipidemia   . Hypertension   . Hypothyroidism   . Lumbar spondylosis   . Lymphedema    seeing specialist for this  . Migraine    "mostly stopped when I changed my diet" (03/03/2017)  . OSA on CPAP   . Pneumonia X 1  . Restless leg syndrome   . Stroke [facility])    TIAs "mini strokes"- unsure of last TIA  . Syncope   . Tremors of nervous system   . Type II diabetes mellitus (HCC)     Family History  Problem Relation Age of Onset  . Heart disease Mother   . Heart failure Mother   . Kidney disease Mother   . Lung cancer Father   . Colon cancer Brother   . Cancer Brother        malignant thymoma  . Heart disease Brother   . Tremor Maternal Grandmother 90  . Ovarian cancer Maternal Grandmother   . Uterine cancer Maternal Grandmother   . Irritable bowel syndrome Daughter   . Esophageal cancer Neg Hx   . Stomach cancer Neg Hx   . Rectal cancer Neg Hx     Past Surgical History:  Procedure Laterality Date  .  APPENDECTOMY  1966  . BACK SURGERY    . CARDIAC CATHETERIZATION N/A 05/25/2016   Procedure: Right/Left Heart Cath and Coronary Angiography;  Surgeon: Larey Dresser, MD;  Location: [facility];  Service: Cardiovascular;  Laterality: N/A;  . CATARACT EXTRACTION W/ INTRAOCULAR LENS  IMPLANT, BILATERAL Bilateral   . COLONOSCOPY    . CORONARY ANGIOGRAM  2009   Normal coronaries  . EXCISION/RELEASE BURSA HIP Bilateral   . I&D KNEE WITH POLY EXCHANGE Left 03/02/2017   Procedure: Left knee Revision of poly liner;  Surgeon: Mcarthur Rossetti, MD;  Location: [facility];  Service: Orthopedics;  Laterality: Left;  . JOINT REPLACEMENT    .  KNEE ARTHROSCOPY Bilateral   . LAPAROSCOPIC CHOLECYSTECTOMY  2015  . LUMBAR DISC SURGERY    . LUMBAR LAMINECTOMY/DECOMPRESSION MICRODISCECTOMY Left 12/2005   L2-3 laminectomy and diskectomy/notes 01/06/2011  . POSTERIOR LUMBAR FUSION  04/2006   [name] 01/06/2011; "put cages in"  . REVISION TOTAL KNEE ARTHROPLASTY Left 03/02/2017   poly liner/notes 03/02/2017  . RIGHT HEART CATH N/A 11/13/2016   Procedure: Right Heart Cath with Cardiomems;  Surgeon: Larey Dresser, MD;  Location: [facility];  Service: Cardiovascular;  Laterality: N/A;  . SHOULDER ARTHROSCOPY WITH ROTATOR CUFF REPAIR Right   . SHOULDER OPEN ROTATOR CUFF REPAIR Left 01/2011   [name] 01/29/2011  . TOTAL ABDOMINAL HYSTERECTOMY    . TOTAL KNEE ARTHROPLASTY Left   . TOTAL KNEE ARTHROPLASTY Right 11/18/2012   Procedure: TOTAL KNEE ARTHROPLASTY;  Surgeon: Yvette Rack., MD;  Location: [facility];  Service: Orthopedics;  Laterality: Right;  . TUBAL LIGATION    . UPPER GI ENDOSCOPY     Social History   Occupational History  . retired    Social History Main Topics  . Smoking status: Never Smoker  . Smokeless tobacco: Never Used  . Alcohol use No  . Drug use: No  . Sexual activity: No

## 2017-06-07 ENCOUNTER — Encounter (HOSPITAL_COMMUNITY): Payer: Self-pay | Admitting: *Deleted

## 2017-06-07 NOTE — Progress Notes (Signed)
Pt was not instructed to stop Plavix prior to surgery by anyone per daughter. I called and spoke with Dr. Marlou Sa and notified him that pt had not stopped Plavix. He said pt would need a cardiology clearance and that surgery would need to be rescheduled at a later date. I have called pt's daughter, Vicente Males and left message giving her this information and need for a cardiology appt. I also notified the OR desk that surgery was being cancelled.

## 2017-06-07 NOTE — Progress Notes (Signed)
Spoke with Vicente Males, pt's daughter for pre-op call. Pt has some dementia. She states pt has hx of CHF, but no CAD. Pt is a type 2 diabetic, last A1C was 5.6 on 02/23/17. Vicente Males states pt's fasting blood sugar is usually around 135. Pt is not on any diabetic medications. Instructed Vicente Males to have pt check her blood sugar in the AM when she gets up and every 2 hours until she leaves for the hospital. If blood sugar is 70 or below, treat with 1/2 cup of clear juice (apple or cranberry) and recheck blood sugar 15 minutes after drinking juice. If blood sugar continues to be 70 or below, call the Short Stay department and ask to speak to a nurse. She voiced understanding.

## 2017-06-08 ENCOUNTER — Telehealth (HOSPITAL_COMMUNITY): Payer: Self-pay | Admitting: *Deleted

## 2017-06-08 ENCOUNTER — Inpatient Hospital Stay (HOSPITAL_COMMUNITY): Admission: RE | Admit: 2017-06-08 | Payer: Medicare Other | Source: Ambulatory Visit | Admitting: Orthopedic Surgery

## 2017-06-08 HISTORY — DX: Unspecified dementia, unspecified severity, without behavioral disturbance, psychotic disturbance, mood disturbance, and anxiety: F03.90

## 2017-06-08 SURGERY — ARTHROPLASTY, SHOULDER, TOTAL, REVERSE
Anesthesia: General | Site: Shoulder | Laterality: Left

## 2017-06-08 NOTE — Telephone Encounter (Signed)
Pt called concerned about LE edema, she states her legs are so swollen they look like they could burst open, they are painful and she feels like she can hardly lift them.  Upon review of meds pt is on Torsemide 80 mg in AM and 40 mg in PM and is no longer on Metolazone, this is what she was directed to do when she was discharged from rehab center.  Med list updated.  Reviewed pt's Cardiomems, she did not do a reading for past 4 days due to no power but today her reading was 2, which is low for her.  Discussed all above w/Amy Ninfa Meeker, NP she recommends pt be seen since meds were changed and we have not seen her in a while.  Appt sch for tomorrow 10/17 at 10:30 w/Dr Aundra Dubin, pt agreeable.  We also discussed importance of keeping her legs elevated.

## 2017-06-09 ENCOUNTER — Ambulatory Visit (INDEPENDENT_AMBULATORY_CARE_PROVIDER_SITE_OTHER): Payer: Medicare Other | Admitting: Orthopedic Surgery

## 2017-06-09 ENCOUNTER — Encounter (HOSPITAL_COMMUNITY): Payer: Self-pay | Admitting: Cardiology

## 2017-06-09 ENCOUNTER — Ambulatory Visit (HOSPITAL_COMMUNITY)
Admission: RE | Admit: 2017-06-09 | Discharge: 2017-06-09 | Disposition: A | Discharge status: Home / Self Care | Payer: Medicare Other | Source: Ambulatory Visit | Attending: Cardiology | Admitting: Cardiology

## 2017-06-09 VITALS — BP 144/64 | HR 93 | Wt 263.0 lb

## 2017-06-09 DIAGNOSIS — I10 Essential (primary) hypertension: Secondary | ICD-10-CM

## 2017-06-09 DIAGNOSIS — Z7982 Long term (current) use of aspirin: Secondary | ICD-10-CM | POA: Insufficient documentation

## 2017-06-09 DIAGNOSIS — E1122 Type 2 diabetes mellitus with diabetic chronic kidney disease: Secondary | ICD-10-CM | POA: Insufficient documentation

## 2017-06-09 DIAGNOSIS — I130 Hypertensive heart and chronic kidney disease with heart failure and stage 1 through stage 4 chronic kidney disease, or unspecified chronic kidney disease: Secondary | ICD-10-CM | POA: Insufficient documentation

## 2017-06-09 DIAGNOSIS — N183 Chronic kidney disease, stage 3 (moderate): Secondary | ICD-10-CM | POA: Insufficient documentation

## 2017-06-09 DIAGNOSIS — R079 Chest pain, unspecified: Secondary | ICD-10-CM | POA: Insufficient documentation

## 2017-06-09 DIAGNOSIS — N289 Disorder of kidney and ureter, unspecified: Secondary | ICD-10-CM

## 2017-06-09 DIAGNOSIS — I890 Lymphedema, not elsewhere classified: Secondary | ICD-10-CM | POA: Diagnosis not present

## 2017-06-09 DIAGNOSIS — Z79899 Other long term (current) drug therapy: Secondary | ICD-10-CM | POA: Insufficient documentation

## 2017-06-09 DIAGNOSIS — I5032 Chronic diastolic (congestive) heart failure: Secondary | ICD-10-CM | POA: Diagnosis not present

## 2017-06-09 DIAGNOSIS — M25512 Pain in left shoulder: Secondary | ICD-10-CM | POA: Insufficient documentation

## 2017-06-09 NOTE — Patient Instructions (Addendum)
Stop Plavix  [facility] will call you after surgery about your unna boots  Your physician recommends that you schedule a follow-up appointment in: 3 months

## 2017-06-09 NOTE — Progress Notes (Signed)
Advanced Heart Failure Clinic Note   Primary Care: Dr. Edrick Oh Renal: Dr Joelyn Oms HF Cardiology: Dr. Aundra Dubin  HPI: [name] is a 71 y.o. with history of chronic diastolic CHF (Echo 2/37/62 LVEF 65%, Mild LAE), chronic lymphedema, chronic back pain, HTN, CKD stage 3, and fibromyalgia.    Admitted several times in 02/2016 to [facility] with CHF and chest pain.  Has been tried on spironolactone + metolazone alone as diuretic regimen. Follows with renal as above.   Seen in [facility] clinic 04/16/16. Thought to have mild volume overload. Lasix dose increased to 40 mg daily.  She was admitted in 9/17 with chest pain and AKI.  She had seen nephrology and was given metolazone to take daily along with Lasix, creatinine rose considerably.  Lasix was decreased to 40 mg bid.  When creatinine improved, she had right and left heart cath showing nonobstructive CAD and near-normal filling pressures.  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    Had Cardiomems placed on 11/13/16. PA diastolic 71mmHg at implant time.   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 This is actually below goal (6-13 mmHg).   She presents today for regular follow up of CHF. She recently spent time in a rehab facility after several falls.  She is in need of shoulder surgery with Dr. Marlou Sa.  Her left shoulder pain makes it hard for her to walk with her walker and also hard for her to push to stand up. She is walking with her walker without dyspnea around the house.  Generally does ok on flat ground.  Legs have been swelling more recently (history of lymphedema).                                                                                                                                        Labs (9/17): K 4.6, creatinine 1.55, BNP 63 Labs (10/17): K 4.7, creatinine 1.8 Labs ( 09/06/2016): K 3.7 Creatinine 2.05  Labs (1/18): K 5.1, creatinine 2.03 Labs (2/18): hgb 12, K 4.3, creatinine 1.87 Labs (11/13/2016) K  3.8 Creatinine 2.12  Labs (10/18): K 3.7, creatinine 1.6  Review of systems complete and found to be negative unless listed in HPI.    Past Medical History 1. Chronic diastolic CHF - Echo 04/23/50 LVEF 65%, Mild LAE - RHC (10/17): mean RA 4, PA 25/11, mean PCWP 5, CI 2.4.  - Echo (2/18): EF 55-60%, mild LVH, normal RV size and systolic function. - Cardiomems implanted 3/18.  2. CKD stage 3: Follows with nephology. Baseline appears to be creatinine 1.4-1.6.  3. Chronic lymphedema: Has had wraps.  4. HTN 5. Dementia 6. Fibromyalgia 7. Microvascular angina:  - Normal coronaries on Cath 2009 - Stress Myoview 04/09/16 with no ischemia - LHC (10/17) with mild nonobstructive disease.  8. Restless leg syndrome  FH: Mother with CHF, brother with "heart disease."  Another brother with  h/o MI.   Current Outpatient Prescriptions  Medication Sig Dispense Refill  . acetaminophen (TYLENOL) 500 MG tablet Take 1,000 mg by mouth 3 (three) times daily.     Marland Kitchen alendronate (FOSAMAX) 70 MG tablet Take 70 mg by mouth every Friday. Take with a full glass of water on an empty stomach.     Marland Kitchen amLODipine (NORVASC) 10 MG tablet Take 1 tablet (10 mg total) by mouth daily. 30 tablet 6  . aspirin EC 81 MG tablet Take 81 mg by mouth daily.     . baclofen (LIORESAL) 10 MG tablet Take 10 mg by mouth 3 (three) times daily.    . clopidogrel (PLAVIX) 75 MG tablet Take 75 mg by mouth daily.     . Cyanocobalamin (VITAMIN B-12 IJ) Inject as directed See admin instructions. Weekly for 4 weeks, has 1 more dose this week (week of 7/2) then will switch to once a month    . donepezil (ARICEPT) 10 MG tablet Take 10 mg by mouth at bedtime.     . DULoxetine (CYMBALTA) 30 MG capsule Take 30 mg by mouth at bedtime.     Marland Kitchen escitalopram (LEXAPRO) 10 MG tablet Take 30 mg by mouth daily.    . fentaNYL (DURAGESIC - DOSED MCG/HR) 25 MCG/HR patch Place 25 mcg onto the skin every 3 (three) days.     . fluticasone (FLONASE) 50 MCG/ACT nasal  spray Place into the nose.    . folic acid (FOLVITE) 1 MG tablet Take 1 mg by mouth 2 (two) times daily.    Marland Kitchen gabapentin (NEURONTIN) 100 MG capsule Take 100 mg by mouth at bedtime.    Marland Kitchen HYDROcodone-acetaminophen (NORCO) 7.5-325 MG tablet Take 1 tablet by mouth 3 (three) times daily as needed for moderate pain or severe pain.     Marland Kitchen lamoTRIgine (LAMICTAL) 100 MG tablet Take 100 mg by mouth 2 (two) times daily.     Marland Kitchen levothyroxine (SYNTHROID, LEVOTHROID) 75 MCG tablet Take 75 mcg by mouth daily before breakfast.    . memantine (NAMENDA) 10 MG tablet Take 10 mg by mouth 2 (two) times daily.    . pravastatin (PRAVACHOL) 80 MG tablet Take 80 mg by mouth at bedtime.     . ranitidine (ZANTAC) 150 MG tablet Take 150 mg by mouth 2 (two) times daily.    Marland Kitchen rOPINIRole (REQUIP) 1 MG tablet Take 1 mg by mouth 4 (four) times daily.     . sennosides-docusate sodium (SENOKOT-S) 8.6-50 MG tablet Take 2 tablets by mouth at bedtime.    Marland Kitchen spironolactone (ALDACTONE) 25 MG tablet Take 25 mg by mouth 2 (two) times daily.    Marland Kitchen torsemide (DEMADEX) 20 MG tablet Take 4 tabs in AM and 2 tabs in PM    . traZODone (DESYREL) 50 MG tablet Take 50 mg by mouth at bedtime.     . Turmeric 500 MG CAPS Take 500 mg by mouth daily.      No current facility-administered medications for this encounter.     No Known Allergies    Social History   Social History  . Marital status: Widowed    Spouse name: N/A  . Number of children: 3  . Years of education: N/A   Occupational History  . retired    Social History Main Topics  . Smoking status: Never Smoker  . Smokeless tobacco: Never Used  . Alcohol use No  . Drug use: No  . Sexual activity: No   Other Topics Concern  .  Not on file   Social History Narrative   Pt does use caffeine. Lives alone. 3 children. Retired.    Vitals:   06/09/17 1051  BP: (!) 144/64  Pulse: 93  SpO2: 100%  Weight: 263 lb (119.3 kg)   Wt Readings from Last 3 Encounters:  06/09/17 263 lb  (119.3 kg)  05/10/17 253 lb (114.8 kg)  04/19/17 257 lb (116.6 kg)    PHYSICAL EXAM: General: NAD Neck: No JVD, no thyromegaly or thyroid nodule.  Lungs: Clear to auscultation bilaterally with normal respiratory effort. CV: Nondisplaced PMI.  Heart regular S1/S2, no S3/S4, 1/6 early SEM RUSB.  Bilateral lower leg lymphedema.  No carotid bruit.  Normal pedal pulses.  Abdomen: Soft, nontender, no hepatosplenomegaly, no distention.  Skin: Intact without lesions or rashes.  Neurologic: Alert and oriented x 3.  Psych: Normal affect. Extremities: No clubbing or cyanosis.  HEENT: Normal.   ASSESSMENT & PLAN: 1. Chronic diastolic CHF:  Echo (0/94) with EF 55-60%, normal RV.  Has Cardiomems. PADP actually low at 3 mmHg (goal has been 6-13 mmHg).  She has no JVD but does have lower leg swelling likely due primarily to lymphedema. NYHA class II-III.  - Continue torsmide 80 mg qam/40 qpm.    - She is not currently using metolazone.  - She has been on Plavix post-Cardiomems.  She can stop Plavix at this point and continue aspirin.  2. Lymphedema: I will try to arrange for a home care service to wrap her lower legs.    3. CKD: Stage 3.  Creatinine lower recently (1.6 in 10/18).  4. Chest pain: LHC 2017 Nonobstructive. Seems to correlate with her volume. None recently.  5. HTN: Mildly elevated in clinic today, but usually stable.  No changes today.  6. Left shoulder pain: Needs operation with Dr. Marlou Sa.  Would do at [facility] rather than a surgical center. I think that she is stable proceed.  7. DM2: Per PCP.   Loralie Champagne, MD  06/09/2017

## 2017-06-10 ENCOUNTER — Encounter (INDEPENDENT_AMBULATORY_CARE_PROVIDER_SITE_OTHER): Payer: Self-pay

## 2017-06-10 ENCOUNTER — Telehealth (INDEPENDENT_AMBULATORY_CARE_PROVIDER_SITE_OTHER): Payer: Self-pay | Admitting: Orthopedic Surgery

## 2017-06-10 ENCOUNTER — Ambulatory Visit (HOSPITAL_COMMUNITY)
Admission: RE | Admit: 2017-06-10 | Discharge: 2017-06-10 | Disposition: A | Discharge status: Home / Self Care | Payer: Medicare Other | Source: Ambulatory Visit | Attending: Cardiology | Admitting: Cardiology

## 2017-06-10 DIAGNOSIS — I5032 Chronic diastolic (congestive) heart failure: Secondary | ICD-10-CM | POA: Diagnosis not present

## 2017-06-10 NOTE — Telephone Encounter (Signed)
Please advise. Patient off plavix already.....she has not been rescheduled for surgery to my knowledge.

## 2017-06-10 NOTE — Telephone Encounter (Signed)
Need to know how long she can be off plavix will reschedule for next week hopefully pls see if we can get mon am if no surgery center cases or whole day tuesday

## 2017-06-10 NOTE — Telephone Encounter (Signed)
Patient called stating that she seen Dr. Donnie Aho yesterday and he cleared her for surgery.  She wanted to make sure that we have received the clearance from his office.  Also she wanted to let Dr. Marlou Sa know that she has been off of her Plavix for 4 days.  CB#[phone].  Thank you.

## 2017-06-10 NOTE — Progress Notes (Signed)
   Cardiomems Update   RHC RA 4 PA 23/10 (15)  PCWP 5 PA 76% CO 9.1 CI 4.1      Implant Labs Creatinine 2.12 BUN 58 Potassium 3.9   PAD Range 6-13 Current PAD  7    Implant Labs Creatinine 2.12 BUN 58 Potassium 3.9   PAD Range 6-13 Current PAD  6   meds - continue torsemide 80 mg /40 mg    Current Labs  Set up for BMET    Recommendations  I have reviewed the patients PA monitoring at least weekly and as needed to bring PA pressures within optimal range. No change at this time.     Richerd Grime NP-C  2:14 PM

## 2017-06-10 NOTE — Telephone Encounter (Signed)
Patient called wanting to know if she was cleared by Dr Marigene Ehlers for surgery. Please give patient a call back.

## 2017-06-11 ENCOUNTER — Telehealth (HOSPITAL_COMMUNITY): Payer: Self-pay

## 2017-06-11 NOTE — Telephone Encounter (Signed)
Patient called to report increased swelling in legs with 3 lb weight gain overnight.  States her feet have never swollen but are now too swollen to put shoes on. Advised per Dr. Aundra Dubin her cardiomems reading is 5 and he still contributes BLE primarily to lymphedema. Advised to elevate legs as much as possible.  Patient awaiting to have legs wrapped after her surgery on Tuesday. Advised patient to call back if s/s worsen.  Renee Pain, RN

## 2017-06-11 NOTE — Telephone Encounter (Signed)
We are going to work on getting patient added on for Tuesday afternoon.

## 2017-06-14 ENCOUNTER — Encounter (HOSPITAL_COMMUNITY): Payer: Self-pay | Admitting: *Deleted

## 2017-06-14 NOTE — Progress Notes (Signed)
Spoke to patient's daughter for preop phone. She states patient has not had chest pain or shortness of breath.

## 2017-06-14 NOTE — Progress Notes (Signed)
Anesthesia Chart Review:  Pt is a same day work up.   Pt is a 71 year old female scheduled for reverse L shoulder arthroplasty on 06/15/2017 with Meredith Pel, MD  - PCP is Dione Housekeeper, MD, who is aware of upcoming surgery. Last office visit 05/31/17 (notes in care everywhere) - Neurologist is Hyman Bower, MD (notes in care everywhere)  - Cardiologist is Loralie Champagne, MD who felt pt was stable to proceed with surgery. Last office visit 06/09/17  - Nephrologist is Pearson Grippe, MD  PMH includes:  Chronic diastolic CHF (s/p Cardiomems implantation 11/13/16), HTN, DM, hyperlipidemia, TIA, syncope, CKD (stage III), anemia, hypothyroidism, OSA, brain lesion, vascular dementia/memory loss. Never smoker. BMI 41.5. S/p cholecystectomy, L TKA, TAH. S/p  R TKA 11/18/12. S/p L knee revision 03/02/17.   Medications include: Amlodipine, ASA 81 mg, Aricept, fentanyl patch, Lamictal, levothyroxine, Namenda, pravastatin, Zantac, torsemide  Labs will be obtained day of surgery.    CXR 01/10/17: No acute abnormality noted.  EKG 01/10/17: NSR. LAFB.   R cardiac cath 11/13/16:  1. Normal right and left heart filling pressures.  2. Successful CardioMems placement.   Echo 10/13/16:  - Left ventricle: The cavity size was normal. There was mildconcentric hypertrophy. Systolic function was normal. Theestimated ejection fraction was in the range of 55% to 60%. Wallmotion was normal; there were no regional wall motionabnormalities. Doppler parameters are consistent with abnormalleft ventricular relaxation (grade 1 diastolic dysfunction). - Aortic valve: Transvalvular velocity was within the normal range.There was no stenosis. There was no regurgitation. - Mitral valve: There was trivial regurgitation. - Right ventricle: The cavity size was normal. Wall thickness wasnormal. Systolic function was normal. - Atrial septum: No defect or patent foramen ovale was identifiedby color flow Doppler. -  Tricuspid valve: There was trivial regurgitation. - Pulmonary arteries: Systolic pressure was within the normalrange. PA peak pressure: 26 mm Hg (S).  R/L cardiac cath 05/25/16:  1. Normal filling pressures and cardiac output, no pulmonary hypertension.  2. No obstructive coronary artery disease (CX 30%).  Pt tolerated L knee revision in July. If labs acceptable day of surgery, I anticipate pt can proceed with surgery as scheduled.   Willeen Cass, FNP-BC [facility] Short Stay Surgical Center/Anesthesiology Phone: [phone] 06/14/2017 4:28 PM

## 2017-06-15 ENCOUNTER — Inpatient Hospital Stay (HOSPITAL_COMMUNITY): Payer: Medicare Other | Admitting: Vascular Surgery

## 2017-06-15 ENCOUNTER — Inpatient Hospital Stay (HOSPITAL_COMMUNITY)
Admission: RE | Admit: 2017-06-15 | Discharge: 2017-06-18 | DRG: 483 | Disposition: A | Discharge status: Skilled Nursing Facility | Payer: Medicare Other | Source: Ambulatory Visit | Attending: Orthopedic Surgery | Admitting: Orthopedic Surgery

## 2017-06-15 ENCOUNTER — Other Ambulatory Visit (INDEPENDENT_AMBULATORY_CARE_PROVIDER_SITE_OTHER): Payer: Self-pay | Admitting: Orthopedic Surgery

## 2017-06-15 ENCOUNTER — Inpatient Hospital Stay (HOSPITAL_COMMUNITY): Payer: Medicare Other

## 2017-06-15 ENCOUNTER — Encounter (HOSPITAL_COMMUNITY): Payer: Self-pay | Admitting: *Deleted

## 2017-06-15 ENCOUNTER — Encounter (HOSPITAL_COMMUNITY)
Admission: RE | Disposition: A | Discharge status: Skilled Nursing Facility | Payer: Self-pay | Source: Ambulatory Visit | Attending: Orthopedic Surgery

## 2017-06-15 DIAGNOSIS — Z8673 Personal history of transient ischemic attack (TIA), and cerebral infarction without residual deficits: Secondary | ICD-10-CM

## 2017-06-15 DIAGNOSIS — F0390 Unspecified dementia without behavioral disturbance: Secondary | ICD-10-CM | POA: Diagnosis present

## 2017-06-15 DIAGNOSIS — Z6841 Body Mass Index (BMI) 40.0 and over, adult: Secondary | ICD-10-CM

## 2017-06-15 DIAGNOSIS — Z96653 Presence of artificial knee joint, bilateral: Secondary | ICD-10-CM | POA: Diagnosis present

## 2017-06-15 DIAGNOSIS — M797 Fibromyalgia: Secondary | ICD-10-CM | POA: Diagnosis present

## 2017-06-15 DIAGNOSIS — E039 Hypothyroidism, unspecified: Secondary | ICD-10-CM | POA: Diagnosis present

## 2017-06-15 DIAGNOSIS — K219 Gastro-esophageal reflux disease without esophagitis: Secondary | ICD-10-CM | POA: Diagnosis present

## 2017-06-15 DIAGNOSIS — F419 Anxiety disorder, unspecified: Secondary | ICD-10-CM | POA: Diagnosis present

## 2017-06-15 DIAGNOSIS — G2581 Restless legs syndrome: Secondary | ICD-10-CM | POA: Diagnosis present

## 2017-06-15 DIAGNOSIS — E1122 Type 2 diabetes mellitus with diabetic chronic kidney disease: Secondary | ICD-10-CM | POA: Diagnosis present

## 2017-06-15 DIAGNOSIS — N183 Chronic kidney disease, stage 3 (moderate): Secondary | ICD-10-CM | POA: Diagnosis present

## 2017-06-15 DIAGNOSIS — I5032 Chronic diastolic (congestive) heart failure: Secondary | ICD-10-CM | POA: Diagnosis present

## 2017-06-15 DIAGNOSIS — Z79899 Other long term (current) drug therapy: Secondary | ICD-10-CM | POA: Diagnosis not present

## 2017-06-15 DIAGNOSIS — I130 Hypertensive heart and chronic kidney disease with heart failure and stage 1 through stage 4 chronic kidney disease, or unspecified chronic kidney disease: Secondary | ICD-10-CM | POA: Diagnosis present

## 2017-06-15 DIAGNOSIS — Z7982 Long term (current) use of aspirin: Secondary | ICD-10-CM | POA: Diagnosis not present

## 2017-06-15 DIAGNOSIS — M19012 Primary osteoarthritis, left shoulder: Secondary | ICD-10-CM

## 2017-06-15 DIAGNOSIS — E662 Morbid (severe) obesity with alveolar hypoventilation: Secondary | ICD-10-CM | POA: Diagnosis present

## 2017-06-15 DIAGNOSIS — G4733 Obstructive sleep apnea (adult) (pediatric): Secondary | ICD-10-CM | POA: Diagnosis present

## 2017-06-15 DIAGNOSIS — M069 Rheumatoid arthritis, unspecified: Secondary | ICD-10-CM | POA: Diagnosis present

## 2017-06-15 DIAGNOSIS — M19019 Primary osteoarthritis, unspecified shoulder: Secondary | ICD-10-CM | POA: Diagnosis present

## 2017-06-15 DIAGNOSIS — E785 Hyperlipidemia, unspecified: Secondary | ICD-10-CM | POA: Diagnosis present

## 2017-06-15 DIAGNOSIS — F329 Major depressive disorder, single episode, unspecified: Secondary | ICD-10-CM | POA: Diagnosis present

## 2017-06-15 HISTORY — PX: REVERSE SHOULDER ARTHROPLASTY: SHX5054

## 2017-06-15 HISTORY — DX: Rheumatoid arthritis, unspecified: M06.9

## 2017-06-15 LAB — CBC
HEMATOCRIT: 34.4 % — AB (ref 36.0–46.0)
HEMOGLOBIN: 11 g/dL — AB (ref 12.0–15.0)
MCH: 28.9 pg (ref 26.0–34.0)
MCHC: 32 g/dL (ref 30.0–36.0)
MCV: 90.5 fL (ref 78.0–100.0)
Platelets: 264 10*3/uL (ref 150–400)
RBC: 3.8 MIL/uL — AB (ref 3.87–5.11)
RDW: 16.5 % — ABNORMAL HIGH (ref 11.5–15.5)
WBC: 7.8 10*3/uL (ref 4.0–10.5)

## 2017-06-15 LAB — BASIC METABOLIC PANEL
Anion gap: 10 (ref 5–15)
BUN: 22 mg/dL — ABNORMAL HIGH (ref 6–20)
CHLORIDE: 104 mmol/L (ref 101–111)
CO2: 26 mmol/L (ref 22–32)
Calcium: 10.1 mg/dL (ref 8.9–10.3)
Creatinine, Ser: 1.46 mg/dL — ABNORMAL HIGH (ref 0.44–1.00)
GFR calc non Af Amer: 35 mL/min — ABNORMAL LOW (ref >60)
GFR, EST AFRICAN AMERICAN: 40 mL/min — AB (ref >60)
Glucose, Bld: 99 mg/dL (ref 65–99)
POTASSIUM: 3.8 mmol/L (ref 3.5–5.1)
SODIUM: 140 mmol/L (ref 135–145)

## 2017-06-15 LAB — GLUCOSE, CAPILLARY
GLUCOSE-CAPILLARY: 92 mg/dL (ref 65–99)
GLUCOSE-CAPILLARY: 128 mg/dL — AB (ref 65–99)
Glucose-Capillary: 81 mg/dL (ref 65–99)

## 2017-06-15 SURGERY — ARTHROPLASTY, SHOULDER, TOTAL, REVERSE
Anesthesia: General | Site: Shoulder | Laterality: Left

## 2017-06-15 MED ORDER — PHENYLEPHRINE 40 MCG/ML (10ML) SYRINGE FOR IV PUSH (FOR BLOOD PRESSURE SUPPORT)
PREFILLED_SYRINGE | INTRAVENOUS | Status: AC
  Filled 2017-06-15: qty 10

## 2017-06-15 MED ORDER — FENTANYL CITRATE (PF) 100 MCG/2ML IJ SOLN
INTRAMUSCULAR | Status: DC | PRN
Start: 2017-06-15 — End: 2017-06-15
  Administered 2017-06-15: 50 ug via INTRAVENOUS
  Administered 2017-06-15: 100 ug via INTRAVENOUS

## 2017-06-15 MED ORDER — MEMANTINE HCL 10 MG PO TABS
10.0000 mg | ORAL_TABLET | Freq: Two times a day (BID) | ORAL | Status: DC
  Administered 2017-06-15 – 2017-06-18 (×6): 10 mg via ORAL
  Filled 2017-06-15 (×6): qty 1

## 2017-06-15 MED ORDER — VANCOMYCIN HCL 500 MG IV SOLR
INTRAVENOUS | Status: DC | PRN
  Administered 2017-06-15: 500 mg

## 2017-06-15 MED ORDER — PROPOFOL 10 MG/ML IV BOLUS
INTRAVENOUS | Status: DC | PRN
  Administered 2017-06-15: 180 mg via INTRAVENOUS

## 2017-06-15 MED ORDER — ONDANSETRON HCL 4 MG PO TABS: 4.0000 mg | ORAL_TABLET | Freq: Four times a day (QID) | ORAL | Status: DC | PRN

## 2017-06-15 MED ORDER — FENTANYL CITRATE (PF) 100 MCG/2ML IJ SOLN
50.0000 ug | INTRAMUSCULAR | Status: DC | PRN
  Administered 2017-06-15: 100 ug via INTRAVENOUS

## 2017-06-15 MED ORDER — HYDROMORPHONE HCL 1 MG/ML IJ SOLN: 0.2500 mg | INTRAMUSCULAR | Status: DC | PRN

## 2017-06-15 MED ORDER — LACTATED RINGERS IV SOLN
INTRAVENOUS | Status: DC | PRN
  Administered 2017-06-15: 14:00:00 via INTRAVENOUS

## 2017-06-15 MED ORDER — FLUTICASONE PROPIONATE 50 MCG/ACT NA SUSP
1.0000 | Freq: Every day | NASAL | Status: DC
  Administered 2017-06-15 – 2017-06-18 (×4): 1 via NASAL
  Filled 2017-06-15: qty 16

## 2017-06-15 MED ORDER — SODIUM CHLORIDE 0.9 % IV SOLN
INTRAVENOUS | Status: DC
  Administered 2017-06-15 (×2): via INTRAVENOUS

## 2017-06-15 MED ORDER — ACETAMINOPHEN 325 MG PO TABS: 650.0000 mg | ORAL_TABLET | ORAL | Status: DC | PRN

## 2017-06-15 MED ORDER — MENTHOL 3 MG MT LOZG: 1.0000 | LOZENGE | OROMUCOSAL | Status: DC | PRN

## 2017-06-15 MED ORDER — PRAVASTATIN SODIUM 40 MG PO TABS
80.0000 mg | ORAL_TABLET | Freq: Every day | ORAL | Status: DC
  Administered 2017-06-15 – 2017-06-17 (×3): 80 mg via ORAL
  Filled 2017-06-15 (×3): qty 2

## 2017-06-15 MED ORDER — MIDAZOLAM HCL 2 MG/2ML IJ SOLN
INTRAMUSCULAR | Status: AC
  Administered 2017-06-15: 2 mg via INTRAVENOUS
  Filled 2017-06-15: qty 2

## 2017-06-15 MED ORDER — ROCURONIUM BROMIDE 100 MG/10ML IV SOLN
INTRAVENOUS | Status: DC | PRN
  Administered 2017-06-15: 60 mg via INTRAVENOUS

## 2017-06-15 MED ORDER — DONEPEZIL HCL 10 MG PO TABS
10.0000 mg | ORAL_TABLET | Freq: Every day | ORAL | Status: DC
  Administered 2017-06-15 – 2017-06-17 (×3): 10 mg via ORAL
  Filled 2017-06-15 (×3): qty 1

## 2017-06-15 MED ORDER — SUGAMMADEX SODIUM 200 MG/2ML IV SOLN
INTRAVENOUS | Status: DC | PRN
  Administered 2017-06-15: 241.4 mg via INTRAVENOUS

## 2017-06-15 MED ORDER — FENTANYL CITRATE (PF) 250 MCG/5ML IJ SOLN
INTRAMUSCULAR | Status: AC
  Filled 2017-06-15: qty 5

## 2017-06-15 MED ORDER — DULOXETINE HCL 30 MG PO CPEP
30.0000 mg | ORAL_CAPSULE | Freq: Every day | ORAL | Status: DC
  Administered 2017-06-15 – 2017-06-17 (×3): 30 mg via ORAL
  Filled 2017-06-15 (×3): qty 1

## 2017-06-15 MED ORDER — MIDAZOLAM HCL 2 MG/2ML IJ SOLN
1.0000 mg | INTRAMUSCULAR | Status: DC | PRN
  Administered 2017-06-15: 2 mg via INTRAVENOUS

## 2017-06-15 MED ORDER — AMLODIPINE BESYLATE 10 MG PO TABS
10.0000 mg | ORAL_TABLET | Freq: Every day | ORAL | Status: DC
  Administered 2017-06-15 – 2017-06-18 (×3): 10 mg via ORAL
  Filled 2017-06-15 (×4): qty 1

## 2017-06-15 MED ORDER — CEFAZOLIN SODIUM-DEXTROSE 2-4 GM/100ML-% IV SOLN
INTRAVENOUS | Status: AC
  Administered 2017-06-15: 13:00:00
  Filled 2017-06-15: qty 100

## 2017-06-15 MED ORDER — HYDROCODONE-ACETAMINOPHEN 10-325 MG PO TABS
1.0000 | ORAL_TABLET | ORAL | Status: DC
  Administered 2017-06-15 – 2017-06-16 (×3): 1 via ORAL
  Filled 2017-06-15 (×3): qty 1

## 2017-06-15 MED ORDER — ONDANSETRON HCL 4 MG/2ML IJ SOLN
INTRAMUSCULAR | Status: AC
  Filled 2017-06-15: qty 2

## 2017-06-15 MED ORDER — LACTATED RINGERS IV SOLN: INTRAVENOUS | Status: DC | PRN

## 2017-06-15 MED ORDER — SENNOSIDES-DOCUSATE SODIUM 8.6-50 MG PO TABS
2.0000 | ORAL_TABLET | Freq: Every day | ORAL | Status: DC
  Administered 2017-06-15 – 2017-06-17 (×3): 2 via ORAL
  Filled 2017-06-15 (×6): qty 2

## 2017-06-15 MED ORDER — KETAMINE INJECTION FOR OPTIME (MG/KG/HR) DOCUMENTATION
INTRAVENOUS | Status: DC | PRN
  Administered 2017-06-15: 30 mg via INTRAVENOUS

## 2017-06-15 MED ORDER — METOCLOPRAMIDE HCL 5 MG/ML IJ SOLN: 5.0000 mg | Freq: Three times a day (TID) | INTRAMUSCULAR | Status: DC | PRN

## 2017-06-15 MED ORDER — ASPIRIN EC 325 MG PO TBEC
325.0000 mg | DELAYED_RELEASE_TABLET | Freq: Every day | ORAL | Status: DC
  Administered 2017-06-15 – 2017-06-18 (×4): 325 mg via ORAL
  Filled 2017-06-15 (×3): qty 1

## 2017-06-15 MED ORDER — ONDANSETRON HCL 4 MG/2ML IJ SOLN: 4.0000 mg | Freq: Once | INTRAMUSCULAR | Status: DC | PRN

## 2017-06-15 MED ORDER — ONDANSETRON HCL 4 MG/2ML IJ SOLN: 4.0000 mg | Freq: Four times a day (QID) | INTRAMUSCULAR | Status: DC | PRN

## 2017-06-15 MED ORDER — FENTANYL CITRATE (PF) 100 MCG/2ML IJ SOLN
INTRAMUSCULAR | Status: AC
  Administered 2017-06-15: 100 ug via INTRAVENOUS
  Filled 2017-06-15: qty 2

## 2017-06-15 MED ORDER — HYDROCODONE-ACETAMINOPHEN 7.5-325 MG PO TABS: 1.0000 | ORAL_TABLET | Freq: Three times a day (TID) | ORAL | Status: DC | PRN

## 2017-06-15 MED ORDER — ONDANSETRON HCL 4 MG/2ML IJ SOLN
INTRAMUSCULAR | Status: DC | PRN
Start: 2017-06-15 — End: 2017-06-15
  Administered 2017-06-15: 4 mg via INTRAVENOUS

## 2017-06-15 MED ORDER — ALENDRONATE SODIUM 70 MG PO TABS: 70.0000 mg | ORAL_TABLET | ORAL | Status: DC

## 2017-06-15 MED ORDER — MIDAZOLAM HCL 2 MG/2ML IJ SOLN
INTRAMUSCULAR | Status: AC
  Filled 2017-06-15: qty 2

## 2017-06-15 MED ORDER — ESCITALOPRAM OXALATE 10 MG PO TABS
10.0000 mg | ORAL_TABLET | Freq: Every day | ORAL | Status: DC
  Administered 2017-06-15 – 2017-06-18 (×4): 10 mg via ORAL
  Filled 2017-06-15 (×4): qty 1

## 2017-06-15 MED ORDER — PROPOFOL 10 MG/ML IV BOLUS
INTRAVENOUS | Status: AC
  Filled 2017-06-15: qty 20

## 2017-06-15 MED ORDER — LEVOTHYROXINE SODIUM 75 MCG PO TABS
75.0000 ug | ORAL_TABLET | Freq: Every day | ORAL | Status: DC
  Administered 2017-06-16 – 2017-06-18 (×3): 75 ug via ORAL
  Filled 2017-06-15 (×3): qty 1

## 2017-06-15 MED ORDER — DEXAMETHASONE SODIUM PHOSPHATE 10 MG/ML IJ SOLN
INTRAMUSCULAR | Status: DC | PRN
  Administered 2017-06-15: 10 mg via INTRAVENOUS

## 2017-06-15 MED ORDER — FAMOTIDINE 20 MG PO TABS
20.0000 mg | ORAL_TABLET | Freq: Every day | ORAL | Status: DC
  Administered 2017-06-16 – 2017-06-18 (×3): 20 mg via ORAL
  Filled 2017-06-15 (×3): qty 1

## 2017-06-15 MED ORDER — MEPERIDINE HCL 25 MG/ML IJ SOLN: 6.2500 mg | INTRAMUSCULAR | Status: DC | PRN

## 2017-06-15 MED ORDER — LIDOCAINE 2% (20 MG/ML) 5 ML SYRINGE
INTRAMUSCULAR | Status: AC
  Filled 2017-06-15: qty 5

## 2017-06-15 MED ORDER — ACETAMINOPHEN 500 MG PO TABS
1000.0000 mg | ORAL_TABLET | Freq: Three times a day (TID) | ORAL | Status: DC
  Administered 2017-06-15: 1000 mg via ORAL
  Filled 2017-06-15: qty 2

## 2017-06-15 MED ORDER — TRAZODONE HCL 50 MG PO TABS
50.0000 mg | ORAL_TABLET | Freq: Every day | ORAL | Status: DC
  Administered 2017-06-15 – 2017-06-17 (×3): 50 mg via ORAL
  Filled 2017-06-15 (×3): qty 1

## 2017-06-15 MED ORDER — ACETAMINOPHEN 650 MG RE SUPP: 650.0000 mg | RECTAL | Status: DC | PRN

## 2017-06-15 MED ORDER — SODIUM CHLORIDE 0.9 % IR SOLN
Status: DC | PRN
  Administered 2017-06-15 (×2): 1000 mL

## 2017-06-15 MED ORDER — ROCURONIUM BROMIDE 10 MG/ML (PF) SYRINGE
PREFILLED_SYRINGE | INTRAVENOUS | Status: AC
  Filled 2017-06-15: qty 5

## 2017-06-15 MED ORDER — BUPIVACAINE-EPINEPHRINE (PF) 0.5% -1:200000 IJ SOLN
INTRAMUSCULAR | Status: DC | PRN
  Administered 2017-06-15: 30 mL via PERINEURAL

## 2017-06-15 MED ORDER — PHENOL 1.4 % MT LIQD: 1.0000 | OROMUCOSAL | Status: DC | PRN

## 2017-06-15 MED ORDER — KETAMINE HCL-SODIUM CHLORIDE 100-0.9 MG/10ML-% IV SOSY
PREFILLED_SYRINGE | INTRAVENOUS | Status: AC
  Filled 2017-06-15: qty 10

## 2017-06-15 MED ORDER — ALBUMIN HUMAN 5 % IV SOLN
INTRAVENOUS | Status: DC | PRN
  Administered 2017-06-15: 14:00:00 via INTRAVENOUS

## 2017-06-15 MED ORDER — BUPIVACAINE HCL 0.5 % IJ SOLN
INTRAMUSCULAR | Status: AC
  Filled 2017-06-15: qty 1

## 2017-06-15 MED ORDER — CEFAZOLIN SODIUM-DEXTROSE 2-3 GM-%(50ML) IV SOLR
INTRAVENOUS | Status: DC | PRN
  Administered 2017-06-15: 2 g via INTRAVENOUS

## 2017-06-15 MED ORDER — METOCLOPRAMIDE HCL 5 MG PO TABS: 5.0000 mg | ORAL_TABLET | Freq: Three times a day (TID) | ORAL | Status: DC | PRN

## 2017-06-15 MED ORDER — CEFAZOLIN SODIUM-DEXTROSE 2-4 GM/100ML-% IV SOLN
2.0000 g | Freq: Four times a day (QID) | INTRAVENOUS | Status: AC
  Administered 2017-06-15 – 2017-06-16 (×2): 2 g via INTRAVENOUS
  Filled 2017-06-15 (×2): qty 100

## 2017-06-15 MED ORDER — SUGAMMADEX SODIUM 500 MG/5ML IV SOLN
INTRAVENOUS | Status: AC
  Filled 2017-06-15: qty 5

## 2017-06-15 MED ORDER — GABAPENTIN 100 MG PO CAPS
100.0000 mg | ORAL_CAPSULE | Freq: Every day | ORAL | Status: DC
  Administered 2017-06-15 – 2017-06-17 (×3): 100 mg via ORAL
  Filled 2017-06-15 (×3): qty 1

## 2017-06-15 MED ORDER — FOLIC ACID 1 MG PO TABS
1.0000 mg | ORAL_TABLET | Freq: Two times a day (BID) | ORAL | Status: DC
  Administered 2017-06-15 – 2017-06-18 (×6): 1 mg via ORAL
  Filled 2017-06-15 (×6): qty 1

## 2017-06-15 MED ORDER — FENTANYL 25 MCG/HR TD PT72
25.0000 ug | MEDICATED_PATCH | TRANSDERMAL | Status: DC
  Administered 2017-06-15: 25 ug via TRANSDERMAL
  Filled 2017-06-15: qty 1

## 2017-06-15 MED ORDER — TORSEMIDE 20 MG PO TABS
80.0000 mg | ORAL_TABLET | Freq: Every day | ORAL | Status: DC
  Administered 2017-06-15 – 2017-06-18 (×4): 80 mg via ORAL
  Filled 2017-06-15 (×4): qty 4

## 2017-06-15 MED ORDER — BACLOFEN 10 MG PO TABS
10.0000 mg | ORAL_TABLET | Freq: Three times a day (TID) | ORAL | Status: DC
  Administered 2017-06-15 – 2017-06-18 (×9): 10 mg via ORAL
  Filled 2017-06-15 (×9): qty 1

## 2017-06-15 MED ORDER — LAMOTRIGINE 100 MG PO TABS
100.0000 mg | ORAL_TABLET | Freq: Every day | ORAL | Status: DC
  Administered 2017-06-15 – 2017-06-18 (×4): 100 mg via ORAL
  Filled 2017-06-15 (×4): qty 1

## 2017-06-15 MED ORDER — DEXAMETHASONE SODIUM PHOSPHATE 10 MG/ML IJ SOLN
INTRAMUSCULAR | Status: AC
  Filled 2017-06-15: qty 1

## 2017-06-15 MED ORDER — ROPINIROLE HCL 1 MG PO TABS
1.0000 mg | ORAL_TABLET | Freq: Four times a day (QID) | ORAL | Status: DC
  Administered 2017-06-15 – 2017-06-18 (×12): 1 mg via ORAL
  Filled 2017-06-15 (×12): qty 1

## 2017-06-15 MED ORDER — POTASSIUM CHLORIDE IN NACL 20-0.9 MEQ/L-% IV SOLN
INTRAVENOUS | Status: AC
  Administered 2017-06-15: 22:00:00 via INTRAVENOUS
  Filled 2017-06-15: qty 1000

## 2017-06-15 MED ORDER — VANCOMYCIN HCL 500 MG IV SOLR
INTRAVENOUS | Status: AC
  Filled 2017-06-15: qty 500

## 2017-06-15 MED ORDER — PHENYLEPHRINE HCL 10 MG/ML IJ SOLN
INTRAMUSCULAR | Status: DC | PRN
  Administered 2017-06-15: 50 ug/min via INTRAVENOUS

## 2017-06-15 MED ORDER — TORSEMIDE 20 MG PO TABS: 20.0000 mg | ORAL_TABLET | Freq: Every day | ORAL | Status: DC | PRN

## 2017-06-15 SURGICAL SUPPLY — 82 items
APL SKNCLS STERI-STRIP NONHPOA (GAUZE/BANDAGES/DRESSINGS) ×1
BASEPLATE GLENOID SHLDR SM (Shoulder) ×1 IMPLANT
BENZOIN TINCTURE PRP APPL 2/3 (GAUZE/BANDAGES/DRESSINGS) ×1 IMPLANT
BLADE SAW SGTL 13X75X1.27 (BLADE) ×2 IMPLANT
BLADE SURG 10 STRL SS (BLADE) ×1 IMPLANT
BSPLAT GLND SM PRFT SHLDR CA (Shoulder) ×1 IMPLANT
BUR MATCHSTICK NEURO 3.0 LAGG (BURR) ×2 IMPLANT
COVER BACK TABLE 60X90IN (DRAPES) IMPLANT
COVER MAYO STAND STRL (DRAPES) ×1 IMPLANT
COVER SURGICAL LIGHT HANDLE (MISCELLANEOUS) ×2 IMPLANT
CUP SUT UNIV REVERS 36+2 LEFT (Cup) ×1 IMPLANT
DRAPE C-ARM 42X72 X-RAY (DRAPES) IMPLANT
DRAPE IMP U-DRAPE 54X76 (DRAPES) IMPLANT
DRAPE INCISE IOBAN 66X45 STRL (DRAPES) ×4 IMPLANT
DRAPE ORTHO SPLIT 77X108 STRL (DRAPES) ×2
DRAPE SURG ORHT 6 SPLT 77X108 (DRAPES) IMPLANT
DRAPE U-SHAPE 47X51 STRL (DRAPES) ×4 IMPLANT
DRSG AQUACEL AG ADV 3.5X10 (GAUZE/BANDAGES/DRESSINGS) ×2 IMPLANT
DURAPREP 26ML APPLICATOR (WOUND CARE) ×2 IMPLANT
ELECT BLADE 6.5 EXT (BLADE) IMPLANT
ELECT REM PT RETURN 9FT ADLT (ELECTROSURGICAL) ×2
ELECTRODE REM PT RTRN 9FT ADLT (ELECTROSURGICAL) ×1 IMPLANT
GLENOSHERE UNI REV 36MM +2.5 (Joint) ×2 IMPLANT
GLENOSPHERE UNI REV 36MM +2.5 (Joint) IMPLANT
GLOVE BIOGEL PI IND STRL 7.0 (GLOVE) IMPLANT
GLOVE BIOGEL PI IND STRL 7.5 (GLOVE) ×1 IMPLANT
GLOVE BIOGEL PI IND STRL 8 (GLOVE) ×1 IMPLANT
GLOVE BIOGEL PI INDICATOR 7.0 (GLOVE) ×1
GLOVE BIOGEL PI INDICATOR 7.5 (GLOVE) ×1
GLOVE BIOGEL PI INDICATOR 8 (GLOVE) ×1
GLOVE ECLIPSE 7.0 STRL STRAW (GLOVE) ×2 IMPLANT
GLOVE SURG ORTHO 8.0 STRL STRW (GLOVE) ×2 IMPLANT
GOWN STRL REUS W/ TWL LRG LVL3 (GOWN DISPOSABLE) ×2 IMPLANT
GOWN STRL REUS W/ TWL XL LVL3 (GOWN DISPOSABLE) ×1 IMPLANT
GOWN STRL REUS W/TWL LRG LVL3 (GOWN DISPOSABLE) ×4
GOWN STRL REUS W/TWL XL LVL3 (GOWN DISPOSABLE) ×2
KIT BASIN OR (CUSTOM PROCEDURE TRAY) ×2 IMPLANT
KIT BEACH CHAIR TRIMANO (MISCELLANEOUS) ×2 IMPLANT
KIT ROOM TURNOVER OR (KITS) ×2 IMPLANT
LINER HUMERAL 36 +3MM SM (Shoulder) ×1 IMPLANT
LOOP VESSEL MAXI BLUE (MISCELLANEOUS) IMPLANT
MANIFOLD NEPTUNE II (INSTRUMENTS) ×2 IMPLANT
NDL HYPO 25GX1X1/2 BEV (NEEDLE) IMPLANT
NDL SUT 6 .5 CRC .975X.05 MAYO (NEEDLE) ×1 IMPLANT
NDL TAPERED W/ NITINOL LOOP (MISCELLANEOUS) IMPLANT
NEEDLE HYPO 25GX1X1/2 BEV (NEEDLE) IMPLANT
NEEDLE MAYO TAPER (NEEDLE) ×2
NEEDLE TAPERED W/ NITINOL LOOP (MISCELLANEOUS) ×2 IMPLANT
NS IRRIG 1000ML POUR BTL (IV SOLUTION) ×2 IMPLANT
PACK SHOULDER (CUSTOM PROCEDURE TRAY) ×2 IMPLANT
PAD ARMBOARD 7.5X6 YLW CONV (MISCELLANEOUS) ×4 IMPLANT
SCREW CENTRAL NONLOCK 6.5X20MM (Shoulder) ×1 IMPLANT
SCREW LOCK PERIPHERAL 30MM (Shoulder) ×2 IMPLANT
SCREW LOCK PERIPHERAL 36MM (Screw) ×1 IMPLANT
SET PIN UNIVERSAL REVERSE (SET/KITS/TRAYS/PACK) ×1 IMPLANT
SLING ARM IMMOBILIZER LRG (SOFTGOODS) ×1 IMPLANT
SPONGE LAP 18X18 X RAY DECT (DISPOSABLE) ×4 IMPLANT
STEM HUMERAL UNI REVERS SZ6 (Stem) ×1 IMPLANT
STRIP CLOSURE SKIN 1/2X4 (GAUZE/BANDAGES/DRESSINGS) ×1 IMPLANT
SUCTION FRAZIER HANDLE 10FR (MISCELLANEOUS) ×1
SUCTION TUBE FRAZIER 10FR DISP (MISCELLANEOUS) ×1 IMPLANT
SUT FIBERWIRE #2 38 T-5 BLUE (SUTURE) ×2
SUT MAXBRAID (SUTURE) IMPLANT
SUT MNCRL AB 3-0 PS2 18 (SUTURE) ×2 IMPLANT
SUT SILK 2 0 (SUTURE) ×2
SUT SILK 2-0 18XBRD TIE 12 (SUTURE) IMPLANT
SUT VIC AB 0 CT1 27 (SUTURE) ×4
SUT VIC AB 0 CT1 27XBRD ANBCTR (SUTURE) IMPLANT
SUT VIC AB 0 CTB1 27 (SUTURE) ×4 IMPLANT
SUT VIC AB 1 CT1 27 (SUTURE) ×2
SUT VIC AB 1 CT1 27XBRD ANBCTR (SUTURE) ×1 IMPLANT
SUT VIC AB 2-0 CT1 27 (SUTURE) ×6
SUT VIC AB 2-0 CT1 TAPERPNT 27 (SUTURE) ×3 IMPLANT
SUT VICRYL 0 TIES 12 18 (SUTURE) ×2 IMPLANT
SUTURE FIBERWR #2 38 T-5 BLUE (SUTURE) ×1 IMPLANT
SUTURE TAPE 1.3 40 TPR END (SUTURE) IMPLANT
SUTURETAPE 1.3 40 TPR END (SUTURE) ×4
SYR CONTROL 10ML LL (SYRINGE) IMPLANT
TOWEL OR 17X24 6PK STRL BLUE (TOWEL DISPOSABLE) ×2 IMPLANT
TOWEL OR 17X26 10 PK STRL BLUE (TOWEL DISPOSABLE) ×2 IMPLANT
TRAY FOLEY BAG SILVER LF 16FR (CATHETERS) IMPLANT
WATER STERILE IRR 1000ML POUR (IV SOLUTION) ×2 IMPLANT

## 2017-06-15 NOTE — Anesthesia Preprocedure Evaluation (Addendum)
Anesthesia Evaluation  Patient identified by MRN, date of birth, ID band Patient awake    Reviewed: Allergy & Precautions, NPO status , Patient's Chart, lab work & pertinent test results  History of Anesthesia Complications (+) PONV and AWARENESS UNDER ANESTHESIA  Airway Mallampati: II  TM Distance: >3 FB Neck ROM: Full    Dental no notable dental hx. (+) Edentulous Upper, Dental Advisory Given   Pulmonary sleep apnea ,    Pulmonary exam normal breath sounds clear to auscultation       Cardiovascular hypertension, +CHF  Normal cardiovascular exam Rhythm:Regular Rate:Normal   Echo 03/20/16 (done at [facility] in [place], [place]):  mild LVH, EF 71%, normal diastolic function, mild LAE, MAC, RVSP 25 mmHg   Neuro/Psych PSYCHIATRIC DISORDERS Anxiety Depression negative neurological ROS     GI/Hepatic negative GI ROS, Neg liver ROS,   Endo/Other  diabetesHypothyroidism   Renal/GU negative Renal ROS  negative genitourinary   Musculoskeletal  (+) Arthritis , Fibromyalgia -  Abdominal   Peds negative pediatric ROS (+)  Hematology negative hematology ROS (+)   Anesthesia Other Findings   Reproductive/Obstetrics negative OB ROS                            Anesthesia Physical  Anesthesia Plan  ASA: III  Anesthesia Plan: General   Post-op Pain Management: GA combined w/ Regional for post-op pain   Induction: Intravenous  PONV Risk Score and Plan: 1 and 2 and Ondansetron, Dexamethasone and Treatment may vary due to age or medical condition  Airway Management Planned: LMA and Oral ETT  Additional Equipment:   Intra-op Plan:   Post-operative Plan: Extubation in OR  Informed Consent: I have reviewed the patients History and Physical, chart, labs and discussed the procedure including the risks, benefits and alternatives for the proposed anesthesia with the patient or authorized representative  who has indicated his/her understanding and acceptance.   Dental advisory given  Plan Discussed with: CRNA and Anesthesiologist  Anesthesia Plan Comments:        Anesthesia Quick Evaluation

## 2017-06-15 NOTE — Anesthesia Procedure Notes (Signed)
Procedure Name: Intubation Date/Time: 06/15/2017 12:43 PM Performed by: Lavell Luster Pre-anesthesia Checklist: Patient identified, Emergency Drugs available, Suction available, Patient being monitored and Timeout performed Patient Re-evaluated:Patient Re-evaluated prior to induction Oxygen Delivery Method: Circle system utilized Preoxygenation: Pre-oxygenation with 100% oxygen Induction Type: IV induction Ventilation: Mask ventilation without difficulty Laryngoscope Size: Mac and 3 Grade View: Grade I Tube type: Oral Tube size: 7.0 mm Number of attempts: 1 Airway Equipment and Method: Stylet Placement Confirmation: ETT inserted through vocal cords under direct vision and breath sounds checked- equal and bilateral Secured at: 22 cm Tube secured with: Tape Dental Injury: Teeth and Oropharynx as per pre-operative assessment

## 2017-06-15 NOTE — Anesthesia Postprocedure Evaluation (Signed)
Anesthesia Post Note  Patient: [name]  Procedure(s) Performed: REVERSE LEFT SHOULDER ARTHROPLASTY (Left Shoulder)     Patient location during evaluation: PACU Anesthesia Type: General Level of consciousness: sedated Pain management: pain level controlled Vital Signs Assessment: post-procedure vital signs reviewed and stable Respiratory status: spontaneous breathing and respiratory function stable Cardiovascular status: stable Postop Assessment: no apparent nausea or vomiting Anesthetic complications: no    Last Vitals:  Vitals:   06/15/17 1720 06/15/17 1735  BP: (!) 165/74 (!) 156/67  Pulse: 78 76  Resp: 19 14  Temp:    SpO2: 97% 98%    Last Pain:  Vitals:   06/15/17 1720  TempSrc:   PainSc: Asleep                 Osmara Drummonds DANIEL

## 2017-06-15 NOTE — H&P (Signed)
[name] is an 71 y.o. female.   Chief Complaint: Left shoulder pain HPI: [name] is a 71 year old female with left shoulder pain.  She has had multiple injections and treatments in the past.  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  She has difficulty weightbearing with the shoulder.  Observation of the patient demonstrates that she does use the shoulders for going from the sitting to standing position but uses them less so when she is ambulating.  She reports night pain and rest pain with movement of the shoulder.  Past Medical History:  Diagnosis Date  . Anemia   . Anxiety   . Arthritis    "hands, feet" (03/03/2017)  . Brain lesion    2 types  . Cervical spondylosis with myelopathy   . Chest pain    Normal cardiac cath 5/09  . Chronic lower back pain   . CKD (chronic kidney disease), stage III ([place])   . Congestive heart failure (CHF) ([place])    has a Cardiomems implant   . Constipation   . Dementia   . Depression   . Dumping syndrome   . Dyspnea    with activity  . Edema   . Fatigue   . Fibromyalgia   . GERD (gastroesophageal reflux disease)   . Headache    "w/high CBG" (03/03/2017)  . History of echocardiogram    a. Echo 03/20/16 (done at [facility] in [place], [place]):  mild LVH, EF 16%, normal diastolic function, mild LAE, MAC, RVSP 25 mmHg  . Hyperlipidemia   . Hypertension   . Hypothyroidism   . Lumbar spondylosis   . Lymphedema    seeing specialist for this  . Migraine    "mostly stopped when I changed my diet" (03/03/2017)  . OSA on CPAP   . Pneumonia X 1  . PONV (postoperative nausea and vomiting)   . Restless leg syndrome   . Rheumatoid arthritis ([place])   . Stroke [facility])    TIAs "mini strokes"- unsure of last TIA - pt and daughter deny this  . Syncope   . Tremors of nervous system   . Type II diabetes mellitus ([place])     Past Surgical History:  Procedure Laterality Date  . APPENDECTOMY  1966  . BACK SURGERY    . CARDIAC CATHETERIZATION N/A  05/25/2016   Procedure: Right/Left Heart Cath and Coronary Angiography;  Surgeon: Larey Dresser, MD;  Location: [place] [facility];  Service: Cardiovascular;  Laterality: N/A;  . CATARACT EXTRACTION W/ INTRAOCULAR LENS  IMPLANT, BILATERAL Bilateral   . COLONOSCOPY    . CORONARY ANGIOGRAM  2009   Normal coronaries  . EXCISION/RELEASE BURSA HIP Bilateral   . I&D KNEE WITH POLY EXCHANGE Left 03/02/2017   Procedure: Left knee Revision of poly liner;  Surgeon: Mcarthur Rossetti, MD;  Location: [place];  Service: Orthopedics;  Laterality: Left;  . JOINT REPLACEMENT    . KNEE ARTHROSCOPY Bilateral   . LAPAROSCOPIC CHOLECYSTECTOMY  2015  . LUMBAR DISC SURGERY    . LUMBAR LAMINECTOMY/DECOMPRESSION MICRODISCECTOMY Left 12/2005   L2-3 laminectomy and diskectomy/notes 01/06/2011  . POSTERIOR LUMBAR FUSION  04/2006   [name] 01/06/2011; "put cages in"  . REVISION TOTAL KNEE ARTHROPLASTY Left 03/02/2017   poly liner/notes 03/02/2017  . RIGHT HEART CATH N/A 11/13/2016   Procedure: Right Heart Cath with Cardiomems;  Surgeon: Larey Dresser, MD;  Location: [place] [facility];  Service: Cardiovascular;  Laterality: N/A;  . SHOULDER ARTHROSCOPY WITH  ROTATOR CUFF REPAIR Right   . SHOULDER OPEN ROTATOR CUFF REPAIR Left 01/2011   [name] 01/29/2011  . TOTAL ABDOMINAL HYSTERECTOMY    . TOTAL KNEE ARTHROPLASTY Left   . TOTAL KNEE ARTHROPLASTY Right 11/18/2012   Procedure: TOTAL KNEE ARTHROPLASTY;  Surgeon: Yvette Rack., MD;  Location: [place];  Service: Orthopedics;  Laterality: Right;  . TUBAL LIGATION    . UPPER GI ENDOSCOPY      Family History  Problem Relation Age of Onset  . Heart disease Mother   . Heart failure Mother   . Kidney disease Mother   . Lung cancer Father   . Colon cancer Brother   . Cancer Brother        malignant thymoma  . Heart disease Brother   . Tremor Maternal Grandmother 90  . Ovarian cancer Maternal Grandmother   . Uterine cancer Maternal Grandmother   . Irritable bowel  syndrome Daughter   . Esophageal cancer Neg Hx   . Stomach cancer Neg Hx   . Rectal cancer Neg Hx    Social History:  reports that she has never smoked. She has never used smokeless tobacco. She reports that she does not drink alcohol or use drugs.  Allergies: No Known Allergies  Medications Prior to Admission  Medication Sig Dispense Refill  . acetaminophen (TYLENOL) 500 MG tablet Take 1,000 mg by mouth 3 (three) times daily.     Marland Kitchen alendronate (FOSAMAX) 70 MG tablet Take 70 mg by mouth every Friday. Take with a full glass of water on an empty stomach.     Marland Kitchen amLODipine (NORVASC) 10 MG tablet Take 1 tablet (10 mg total) by mouth daily. 30 tablet 6  . aspirin EC 81 MG tablet Take 81 mg by mouth daily.     . baclofen (LIORESAL) 10 MG tablet Take 10 mg by mouth 3 (three) times daily.    . Cyanocobalamin (VITAMIN B-12 IJ) Inject as directed See admin instructions. Weekly for 4 weeks, has 1 more dose this week (week of 7/2) then will switch to once a month    . donepezil (ARICEPT) 10 MG tablet Take 10 mg by mouth at bedtime.     . DULoxetine (CYMBALTA) 30 MG capsule Take 30 mg by mouth at bedtime.     Marland Kitchen escitalopram (LEXAPRO) 10 MG tablet Take 10 mg by mouth daily.     . fentaNYL (DURAGESIC - DOSED MCG/HR) 25 MCG/HR patch Place 25 mcg onto the skin every 3 (three) days.     . fluticasone (FLONASE) 50 MCG/ACT nasal spray Place 1 spray into the nose daily.     . folic acid (FOLVITE) 1 MG tablet Take 1 mg by mouth 2 (two) times daily.    Marland Kitchen gabapentin (NEURONTIN) 100 MG capsule Take 100 mg by mouth at bedtime.    Marland Kitchen HYDROcodone-acetaminophen (NORCO) 7.5-325 MG tablet Take 1 tablet by mouth 3 (three) times daily as needed for moderate pain or severe pain.     Marland Kitchen lamoTRIgine (LAMICTAL) 100 MG tablet Take 100 mg by mouth daily.     Marland Kitchen levothyroxine (SYNTHROID, LEVOTHROID) 75 MCG tablet Take 75 mcg by mouth daily before breakfast.    . memantine (NAMENDA) 10 MG tablet Take 10 mg by mouth 2 (two) times  daily.    . pravastatin (PRAVACHOL) 80 MG tablet Take 80 mg by mouth at bedtime.     . ranitidine (ZANTAC) 150 MG tablet Take 150 mg by mouth 2 (two) times daily.    Marland Kitchen  rOPINIRole (REQUIP) 1 MG tablet Take 1 mg by mouth 4 (four) times daily.     . sennosides-docusate sodium (SENOKOT-S) 8.6-50 MG tablet Take 2 tablets by mouth at bedtime.    . torsemide (DEMADEX) 20 MG tablet Take 80 mg by mouth daily.     Marland Kitchen torsemide (DEMADEX) 20 MG tablet Take 20-60 mg by mouth daily as needed (for fluid retention).    . traZODone (DESYREL) 50 MG tablet Take 50 mg by mouth at bedtime.     . Turmeric 500 MG CAPS Take 500 mg by mouth daily.       Results for orders placed or performed during the hospital encounter of 06/15/17 (from the past 48 hour(s))  Basic metabolic panel     Status: Abnormal   Collection Time: 06/15/17 10:19 AM  Result Value Ref Range   Sodium 140 135 - 145 mmol/L   Potassium 3.8 3.5 - 5.1 mmol/L   Chloride 104 101 - 111 mmol/L   CO2 26 22 - 32 mmol/L   Glucose, Bld 99 65 - 99 mg/dL   BUN 22 (H) 6 - 20 mg/dL   Creatinine, Ser 1.46 (H) 0.44 - 1.00 mg/dL   Calcium 10.1 8.9 - 10.3 mg/dL   GFR calc non Af Amer 35 (L) >60 mL/min   GFR calc Af Amer 40 (L) >60 mL/min    Comment: (NOTE) The eGFR has been calculated using the CKD EPI equation. This calculation has not been validated in all clinical situations. eGFR's persistently <60 mL/min signify possible Chronic Kidney Disease.    Anion gap 10 5 - 15  CBC     Status: Abnormal   Collection Time: 06/15/17 10:19 AM  Result Value Ref Range   WBC 7.8 4.0 - 10.5 K/uL   RBC 3.80 (L) 3.87 - 5.11 MIL/uL   Hemoglobin 11.0 (L) 12.0 - 15.0 g/dL   HCT 34.4 (L) 36.0 - 46.0 %   MCV 90.5 78.0 - 100.0 fL   MCH 28.9 26.0 - 34.0 pg   MCHC 32.0 30.0 - 36.0 g/dL   RDW 16.5 (H) 11.5 - 15.5 %   Platelets 264 150 - 400 K/uL  Glucose, capillary     Status: None   Collection Time: 06/15/17 10:32 AM  Result Value Ref Range   Glucose-Capillary 81 65 -  99 mg/dL   No results found.  Review of Systems  Musculoskeletal: Positive for joint pain.  All other systems reviewed and are negative.   Blood pressure (!) 152/58, pulse 70, temperature (!) 97.5 F (36.4 C), temperature source Oral, resp. rate 19, height _0  (1.676 m), weight 266 lb (120.7 kg), SpO2 100 %. Physical Exam  Constitutional: She appears well-developed.  HENT:  Head: Normocephalic.  Eyes: Pupils are equal, round, and reactive to light.  Neck: Normal range of motion.  Cardiovascular: Normal rate.   Respiratory: Effort normal.  Neurological: She is alert.  Skin: Skin is warm.  Psychiatric: She has a normal mood and affect.  Examination of the left shoulder demonstrates coarseness with range of motion.  Somewhat weak supraspinatus testing on the left compared to the right but more reasonable infraspinatus and subscap testing on the left.  There is pain with any range of motion above 90 degrees of forward flexion and abduction.  Motor sensory function to the hand is intact.  Radial pulses intact.  Deltoid fires.  Assessment/Plan Impression is end-stage shoulder arthritis in a 71 year old female with multiple other medical problems.  Plan is  reverse shoulder replacement after explanation of risks and benefits.  I did observe her getting up from a chair and reverse replacement set at 155 will likely be her best option to prevent instability and to help with her pain.  Dislocation infection nerve vessel damage as risk factors all discussed.  All questions answered  Anderson Malta, MD 06/15/2017, 11:40 AM

## 2017-06-15 NOTE — Brief Op Note (Signed)
06/15/2017  4:36 PM  PATIENT:  [name]  71 y.o. female  PRE-OPERATIVE DIAGNOSIS:  left shoulder osteoarthritis  POST-OPERATIVE DIAGNOSIS:  left shoulder osteoarthritis  PROCEDURE:  Procedure(s): REVERSE LEFT SHOULDER ARTHROPLASTY  SURGEON:  Surgeon(s): Marlou Sa, Tonna Corner, MD  ASSISTANT: Laure Kidney rnfa  ANESTHESIA:   general  EBL: 150 ml    Total I/O In: 1250 [I.V.:1000; IV Piggyback:250] Out: 521 [Urine:610; Blood:160]  BLOOD ADMINISTERED: none  DRAINS: none   LOCAL MEDICATIONS USED:  none  SPECIMEN:  No Specimen  COUNTS:  YES  TOURNIQUET:  * No tourniquets in log *  DICTATION: .Other Dictation: Dictation Number [id number]  PLAN OF CARE: Admit to inpatient   PATIENT DISPOSITION:  PACU - hemodynamically stable

## 2017-06-15 NOTE — Anesthesia Procedure Notes (Signed)
Anesthesia Regional Block: Interscalene brachial plexus block   Pre-Anesthetic Checklist: ,, timeout performed, Correct Patient, Correct Site, Correct Laterality, Correct Procedure, Correct Position, site marked, Risks and benefits discussed,  Surgical consent,  Pre-op evaluation,  At surgeon's request and post-op pain management  Laterality: Left  Prep: chloraprep       Needles:  Injection technique: Single-shot  Needle Type: Echogenic Stimulator Needle     Needle Length: 5cm  Needle Gauge: 22     Additional Needles:   Narrative:  Start time: 06/15/2017 12:06 PM End time: 06/15/2017 12:16 PM Injection made incrementally with aspirations every 5 mL.  Performed by: Personally  Anesthesiologist: Duane Boston  Additional Notes: Functioning IV was confirmed and monitors applied.  A 20mm 22ga echogenic arrow stimulator was used. Sterile prep and drape,hand hygiene and sterile gloves were used.Ultrasound guidance: relevant anatomy identified, needle position confirmed, local anesthetic spread visualized around nerve(s)., vascular puncture avoided.  Image printed for medical record.  Negative aspiration and negative test dose prior to incremental administration of local anesthetic. The patient tolerated the procedure well.

## 2017-06-15 NOTE — Transfer of Care (Signed)
Immediate Anesthesia Transfer of Care Note  Patient: [name]  Procedure(s) Performed: REVERSE LEFT SHOULDER ARTHROPLASTY (Left Shoulder)  Patient Location: PACU  Anesthesia Type:GA combined with regional for post-op pain  Level of Consciousness: awake and alert   Airway & Oxygen Therapy: Patient Spontanous Breathing and Patient connected to nasal cannula oxygen  Post-op Assessment: Report given to RN and Post -op Vital signs reviewed and stable  Post vital signs: Reviewed and stable  Last Vitals:  Vitals:   06/15/17 1210 06/15/17 1215  BP: (!) 128/42 (!) 142/43  Pulse: 71 79  Resp: 15 (!) 33  Temp:    SpO2: 99% 98%    Last Pain:  Vitals:   06/15/17 1106  TempSrc:   PainSc: 5       Patients Stated Pain Goal: 3 (65/99/35 7017)  Complications: No apparent anesthesia complications

## 2017-06-15 NOTE — Progress Notes (Signed)

## 2017-06-16 MED ORDER — HYDROCODONE-ACETAMINOPHEN 10-325 MG PO TABS
1.0000 | ORAL_TABLET | ORAL | Status: AC
  Administered 2017-06-16 – 2017-06-17 (×7): 1 via ORAL
  Filled 2017-06-16 (×7): qty 1

## 2017-06-16 MED ORDER — HYDROCODONE-ACETAMINOPHEN 10-325 MG PO TABS
1.0000 | ORAL_TABLET | ORAL | Status: DC | PRN
  Administered 2017-06-17 – 2017-06-18 (×8): 1 via ORAL
  Filled 2017-06-16 (×8): qty 1

## 2017-06-16 NOTE — Progress Notes (Signed)
Patient had fentanyl 25 mcg patch on Right upper arm with date of 10/21 patient states placed it on from home per her home orders. New orders from doctor here to change the patch. New one applied to left upper arm. Old patch wasted in sharps container in medication room with Joya Martyr RN

## 2017-06-16 NOTE — Progress Notes (Signed)
Subjective: Patient stable.  Block is starting to wear off.  Pain controlled at this time   Objective: Vital signs in last 24 hours: Temp:  [97.5 F (36.4 C)-99.1 F (37.3 C)] 98.4 F (36.9 C) (10/24 0610) Pulse Rate:  [70-82] 78 (10/24 0610) Resp:  [14-33] 14 (10/24 0610) BP: (115-165)/(42-98) 115/45 (10/24 0610) SpO2:  [91 %-100 %] 100 % (10/24 0610) Weight:  [266 lb (120.7 kg)] 266 lb (120.7 kg) (10/23 1035)  Intake/Output from previous day: 10/23 0701 - 10/24 0700 In: 2150 [I.V.:1900; IV Piggyback:250] Out: 5745 [Urine:5585; Blood:160] Intake/Output this shift: No intake/output data recorded.  Exam:  No cellulitis present Compartment soft  Labs:  Recent Labs  06/15/17 1019  HGB 11.0*    Recent Labs  06/15/17 1019  WBC 7.8  RBC 3.80*  HCT 34.4*  PLT 264    Recent Labs  06/15/17 1019  NA 140  K 3.8  CL 104  CO2 26  BUN 22*  CREATININE 1.46*  GLUCOSE 99  CALCIUM 10.1   No results for input(s): LABPT, INR in the last 72 hours.  Assessment/Plan: Plan occupational therapy today.  Start with motion.  Patient needs to be out of bed today.  She will require assist for ambulation.  Okay for her to weight-bear through the left shoulder using a walker   [name] 06/16/2017, 8:52 AM

## 2017-06-16 NOTE — Progress Notes (Signed)
Spoke with Dr. Marlou Sa concerning patients pain medication. Orders given to give Norco 10-325 q3 hours scheduled today due to block wearing off.  Tomorrow patient may go back to Norco 10-325 q4 hours scheduled. Orders repeated and entered in system.

## 2017-06-16 NOTE — Evaluation (Addendum)
Physical Therapy Evaluation Patient Details Name: [name] MRN: [id number] DOB: 08-Mar-1946 Today's Date: 06/16/2017   History of Present Illness  Pt is a 71 y.o. female s/p reservse left shoulder arthroplasty. PMH includes but not limited to anemia, anxiety, arthritis, brain lesion, cervical spondylosis wtih myelopathy, chronic low back pain, CKD, CHF, dementia, depression, dumping syndrome, dyspnea, edema, fatigue, fibromyalgia, GERD, hyperlipidemia, HTN, lymphedema, OSA on CPAP, PONV, restless leg syndrome, RA, stroke, syncope, DM. PSH includes but not limited to: right heart cath (2018), LTKA and I&D (02/2017).   Clinical Impression  Patient presents with decreased independence with mobility due to pain, limited UE use, decreased activity tolerance, decreased balance and will benefit from skilled PT in the acute setting to allow d/c to SNF level rehab (already set up to go to facility in [place].)  Currently she requires mod to max A for mobility.  Previously living alone using assistive devices.      Follow Up Recommendations SNF;Supervision/Assistance - 24 hour    Equipment Recommendations  Other (comment) (TBA if will need platform for her walker at home)    Recommendations for Other Services       Precautions / Restrictions Precautions Precautions: Shoulder;Fall Type of Shoulder Precautions: [place] Protocol. NWB (OK for walker). Hand, wrist, elbow exercises OK. Pendulums OK.  FF 90, ABD 60, ER 30.  Shoulder Interventions: Shoulder sling/immobilizer;Off for dressing/bathing/exercises Precaution Booklet Issued: Yes (comment) Precaution Comments: Reviewed handout with pt  Required Braces or Orthoses: Sling Restrictions Weight Bearing Restrictions: Yes LUE Weight Bearing: Weight bear through elbow only (okay to use platform walker per MD)      Mobility  Bed Mobility               General bed mobility comments: up in chair  Transfers Overall transfer level: Needs  assistance Equipment used: Left platform walker Transfers: Sit to/from Stand Sit to Stand: Max assist Stand pivot transfers: Mod assist;+2 physical assistance       General transfer comment: lifting help from recliner with using pad under pt to stand  Ambulation/Gait Ambulation/Gait assistance: Mod assist;Min assist Ambulation Distance (Feet): 80 Feet Assistive device: Left platform walker Gait Pattern/deviations: Step-to pattern;Shuffle;Decreased stance time - left;Wide base of support     General Gait Details: slow pace, decreased tolerance to weight bearing on L and reports R LE fatigue over time.  assist at time to maneuver walker esp for turns  Stairs            Wheelchair Mobility    Modified Rankin (Stroke Patients Only)       Balance Overall balance assessment: Needs assistance Sitting-balance support: No upper extremity supported;Feet supported Sitting balance-Leahy Scale: Fair     Standing balance support: Single extremity supported;No upper extremity supported;During functional activity Standing balance-Leahy Scale: Poor Standing balance comment: UE support for balabnce                             Pertinent Vitals/Pain Pain Assessment: Faces Faces Pain Scale: Hurts even more Pain Location: Left shoulder Pain Descriptors / Indicators: Aching;Sore;Operative site guarding Pain Intervention(s): Repositioned;Monitored during session;Ice applied    Home Living Family/patient expects to be discharged to:: Skilled nursing facility Living Arrangements: Alone                    Prior Function Level of Independence: Independent with assistive device(s)         Comments: Pt reports using  a RW for functional mobility.      Hand Dominance   Dominant Hand: Right    Extremity/Trunk Assessment   Upper Extremity Assessment Upper Extremity Assessment: Defer to OT evaluation LUE Deficits / Details: s/p reservse left shoulder  arthroplasty LUE: Unable to fully assess due to immobilization    Lower Extremity Assessment Lower Extremity Assessment: LLE deficits/detail LLE Deficits / Details: grossly WFL, but had TKA revision on 03/02/17 so limited strength, weight tolerance and AROM still per pt    Cervical / Trunk Assessment Cervical / Trunk Assessment: Kyphotic  Communication   Communication: No difficulties  Cognition Arousal/Alertness: Awake/alert Behavior During Therapy: WFL for tasks assessed/performed Overall Cognitive Status: Within Functional Limits for tasks assessed                                        General Comments General comments (skin integrity, edema, etc.): plans to go to [place] SNF then transition into ALF; SpO2 at rest on RA 86%, three reps on IS with cues up to 91%, after ambulation on RA SpO2 97% HR 120    Exercises    Assessment/Plan    PT Assessment Patient needs continued PT services  PT Problem List Decreased strength;Decreased mobility;Decreased activity tolerance;Decreased knowledge of use of DME;Decreased balance;Pain       PT Treatment Interventions DME instruction;Therapeutic activities;Therapeutic exercise;Patient/family education;Gait training;Balance training;Functional mobility training    PT Goals (Current goals can be found in the Care Plan section)  Acute Rehab PT Goals Patient Stated Goal: To get rehab  PT Goal Formulation: With patient Time For Goal Achievement: 06/23/17 Potential to Achieve Goals: Good    Frequency Min 3X/week   Barriers to discharge        Co-evaluation               AM-PAC PT "6 Clicks" Daily Activity  Outcome Measure Difficulty turning over in bed (including adjusting bedclothes, sheets and blankets)?: Unable Difficulty moving from lying on back to sitting on the side of the bed? : Unable Difficulty sitting down on and standing up from a chair with arms (e.g., wheelchair, bedside commode, etc,.)?:  Unable Help needed moving to and from a bed to chair (including a wheelchair)?: A Lot Help needed walking in hospital room?: A Lot Help needed climbing 3-5 steps with a railing? : Total 6 Click Score: 8    End of Session Equipment Utilized During Treatment: Gait belt;Other (comment) (sling)   Patient left: in chair;with call bell/phone within reach   PT Visit Diagnosis: Pain;Other abnormalities of gait and mobility (R26.89) Pain - Right/Left: Left Pain - part of body: Shoulder    Time: 5462-7035 PT Time Calculation (min) (ACUTE ONLY): 28 min   Charges:   PT Evaluation $PT Eval Moderate Complexity: 1 Mod PT Treatments $Gait Training: 8-22 mins   PT G CodesMagda Kiel, [place] [phone] 06/16/2017   Reginia Naas 06/16/2017, 12:36 PM

## 2017-06-16 NOTE — Therapy (Signed)
Occupational Therapy Evaluation Patient Details Name: [name] MRN: [id number] DOB: 08-Sep-1945 Today's Date: 06/16/2017    History of Present Illness Pt is a 71 y.o. female s/p reservse left shoulder arthroplasty. PMH includes but not limited to anemia, anxiety, arthritis, brain lesion, cervical spondylosis wtih myelopathy, chronic low back pain, CKD, CHF, dementia, depression, dumping syndrome, dyspnea, edema, fatigue, fibromyalgia, GERD, hyperlipidemia, HTN, lymphedema, OSA on CPAP, PONV, restless leg syndrome, RA, stroke, syncope, DM. PSH includes but not limited to: right heart cath (2018), LTKA and I&D (02/2017).    Clinical Impression   Pt reports using a RW for functional mobility and being independent in ADLs PTA. Currently, pt requires mod assist +2 for stand pivot transfers, moderate assist for UB ADLs, and total assist for LB ADLs. Pt reports several falls within the past few weeks and has arranged for rehab at a SNF and then plans to move to an ALF. Pt would benefit from acute OT services to increase independence with ADLs while admitted. OT will follow acutely to address established goals.     Follow Up Recommendations  SNF;Supervision/Assistance - 24 hour    Equipment Recommendations  Other (comment) (TBD at next venue of care)    Recommendations for Other Services PT consult     Precautions / Restrictions Precautions Precautions: Shoulder;Fall Type of Shoulder Precautions: [place] Protocol. NWB (OK for walker). Hand, wrist, elbow exercises OK. Pendulums OK.  FF 90, ABD 60, ER 30.  Shoulder Interventions: Shoulder sling/immobilizer;Off for dressing/bathing/exercises Precaution Booklet Issued: Yes (comment) Precaution Comments: Reviewed handout with pt  Required Braces or Orthoses: Sling Restrictions Weight Bearing Restrictions: Yes RUE Weight Bearing: Non weight bearing Other Position/Activity Restrictions: MD approved pt to weight bear for use of RW        Mobility Bed Mobility               General bed mobility comments: Pt sitting EOB with nurse tech upon arrival.   Transfers Overall transfer level: Needs assistance Equipment used: Rolling walker (2 wheeled) Transfers: Sit to/from [facility] Sit to Stand: Mod assist;+2 physical assistance Stand pivot transfers: Mod assist;+2 physical assistance       General transfer comment: Mod assist with use of ped pad for sit to stand and stand pivot transfer. Pt able to take a few side steps during stand pivot     Balance Overall balance assessment: Needs assistance Sitting-balance support: No upper extremity supported;Feet supported Sitting balance-Leahy Scale: Fair     Standing balance support: Single extremity supported;No upper extremity supported;During functional activity Standing balance-Leahy Scale: Poor Standing balance comment: Pt requires mod assist +2 during dynamic standing activities. Min assist for static standing with single UE support                           ADL either performed or assessed with clinical judgement   ADL Overall ADL's : Needs assistance/impaired Eating/Feeding: Independent   Grooming: Minimal assistance;Sitting   Upper Body Bathing: Moderate assistance;Sitting   Lower Body Bathing: Total assistance;Sit to/from stand   Upper Body Dressing : Moderate assistance;Sitting   Lower Body Dressing: Total assistance;Sit to/from stand   Toilet Transfer: Moderate assistance;+2 for physical assistance;BSC;RW;Requires wide/bariatric Toilet Transfer Details (indicate cue type and reason): Simulated sit to stand from EOB.          Functional mobility during ADLs: Moderate assistance;+2 for safety/equipment;Rolling walker General ADL Comments: Plan to completed ADL education in next session  Vision         Perception     Praxis      Pertinent Vitals/Pain Pain Assessment: Faces Faces Pain Scale: Hurts little  more Pain Location: Left shoulder Pain Descriptors / Indicators: Aching;Sore;Operative site guarding Pain Intervention(s): Monitored during session;Limited activity within patient's tolerance;Ice applied     Hand Dominance Right   Extremity/Trunk Assessment Upper Extremity Assessment Upper Extremity Assessment: LUE deficits/detail LUE Deficits / Details: s/p reservse left shoulder arthroplasty LUE: Unable to fully assess due to immobilization   Lower Extremity Assessment Lower Extremity Assessment: Defer to PT evaluation       Communication Communication Communication: No difficulties   Cognition Arousal/Alertness: Awake/alert Behavior During Therapy: WFL for tasks assessed/performed Overall Cognitive Status: Within Functional Limits for tasks assessed                                     General Comments  pt reports she plans to go to a SNF for rehab and then plans to move into an ALF     Exercises Exercises: Shoulder Shoulder Exercises Pendulum Exercise: PROM;Left;10 reps;Seated Elbow Flexion: AAROM;10 reps;Seated;Left Elbow Extension: AAROM;10 reps;Seated;Left Wrist Flexion: AROM;Left;10 reps;Seated Wrist Extension: AROM;Left;10 reps;Seated Digit Composite Flexion: AROM;Left;10 reps;Seated (Pt with limited composite flexion due to RA)   Shoulder Instructions Shoulder Instructions Donning/doffing shirt without moving shoulder: Moderate assistance Method for sponge bathing under operated UE: Moderate assistance Donning/doffing sling/immobilizer: Maximal assistance Correct positioning of sling/immobilizer: Maximal assistance Pendulum exercises (written home exercise program): Supervision/safety ROM for elbow, wrist and digits of operated UE: Supervision/safety Sling wearing schedule (on at all times/off for ADL's): Supervision/safety Proper positioning of operated UE when showering: Minimal assistance Positioning of UE while sleeping: Minimal assistance     Home Living Family/patient expects to be discharged to:: Skilled nursing facility Living Arrangements: Alone                                      Prior Functioning/Environment Level of Independence: Independent with assistive device(s)        Comments: Pt reports using a RW for functional mobility.         OT Problem List: Impaired UE functional use;Pain;Decreased knowledge of use of DME or AE;Decreased knowledge of precautions;Decreased activity tolerance;Impaired balance (sitting and/or standing)      OT Treatment/Interventions: Self-care/ADL training;Therapeutic exercise;DME and/or AE instruction;Therapeutic activities;Cognitive remediation/compensation;Patient/family education;Balance training    OT Goals(Current goals can be found in the care plan section) Acute Rehab OT Goals Patient Stated Goal: To get rehab  OT Goal Formulation: With patient Time For Goal Achievement: 06/30/17 Potential to Achieve Goals: Good ADL Goals Pt Will Perform Grooming: with supervision;sitting Pt Will Perform Upper Body Bathing: with supervision;sitting Pt Will Perform Upper Body Dressing: with supervision;sitting Pt Will Transfer to Toilet: with supervision;ambulating Pt Will Perform Toileting - Clothing Manipulation and hygiene: with supervision;sit to/from stand Pt/caregiver will Perform Home Exercise Program: Left upper extremity;With written HEP provided  OT Frequency: Min 2X/week   Barriers to D/C:            Co-evaluation              AM-PAC PT "6 Clicks" Daily Activity     Outcome Measure Help from another person eating meals?: None Help from another person taking care of personal grooming?: A Little Help from another  person toileting, which includes using toliet, bedpan, or urinal?: A Lot Help from another person bathing (including washing, rinsing, drying)?: A Lot Help from another person to put on and taking off regular upper body clothing?: A  Lot Help from another person to put on and taking off regular lower body clothing?: A Lot 6 Click Score: 15   End of Session Equipment Utilized During Treatment: Gait belt;Rolling walker Nurse Communication: Mobility status  Activity Tolerance: Patient tolerated treatment well Patient left: in chair;with call bell/phone within reach  OT Visit Diagnosis: Other abnormalities of gait and mobility (R26.89)                Time: 1771-1657 OT Time Calculation (min): 32 min Charges:  OT General Charges $OT Visit: 1 Visit OT Evaluation $OT Eval Moderate Complexity: 1 Mod OT Treatments $Therapeutic Exercise: 8-22 mins G-Codes:     Boykin Peek, OTS [phone]   Boykin Peek 06/16/2017, 11:36 AM

## 2017-06-17 ENCOUNTER — Ambulatory Visit (INDEPENDENT_AMBULATORY_CARE_PROVIDER_SITE_OTHER): Payer: Medicare Other | Admitting: Orthopaedic Surgery

## 2017-06-17 LAB — GLUCOSE, CAPILLARY: Glucose-Capillary: 87 mg/dL (ref 65–99)

## 2017-06-17 MED ORDER — ASPIRIN 325 MG PO TBEC
325.0000 mg | DELAYED_RELEASE_TABLET | Freq: Every day | ORAL | 0 refills | Status: DC
Start: 2017-06-18 — End: 2017-11-29

## 2017-06-17 MED ORDER — HYDROCODONE-ACETAMINOPHEN 10-325 MG PO TABS: 1.0000 | ORAL_TABLET | ORAL | 0 refills | Status: DC | PRN

## 2017-06-17 NOTE — Progress Notes (Signed)
OT Cancellation Note  Patient Details Name: MOIRA UMHOLTZ MRN: [id number] DOB: Jun 25, 1946   Cancelled Treatment:    Reason Eval/Treat Not Completed: Other (comment) (CPM rep in room setting up shoulder CPM). Will follow up for OT treatment as time allows.  Binnie Kand M.S., OTR/L Pager: [phone]  06/17/2017, 2:19 PM

## 2017-06-17 NOTE — Clinical Social Work Note (Signed)
Clinical Social Work Assessment  Patient Details  Name: [name] MRN: [id number] Date of Birth: 11/24/1945  Date of referral:  06/17/17               Reason for consult:  Facility Placement                Permission sought to share information with:  Chartered certified accountant granted to share information::  Yes, Verbal Permission Granted  Name::     [name]  Agency::  [facility]  Relationship::     Contact Information:     Housing/Transportation Living arrangements for the past 2 months:  [place] of Information:  Patient Patient Interpreter Needed:  None Criminal Activity/Legal Involvement Pertinent to Current Situation/Hospitalization:  No - Comment as needed Significant Relationships:  Adult Children Lives with:  Self Do you feel safe going back to the place where you live?  No Need for family participation in patient care:  No (Coment)  Care giving concerns:  Patient resided at home alone prior to hospitalization. She ambulated with a walker. She completed all ADL's on her own. She indicated that she arranged to go to [facility] for short term and will transition to the ALF once she has completed rehab. Pt was recommended for SNF by clinical team as she is not safe to return home alone at this time.  Social Worker assessment / plan:  CSW discussed the the SNF process and options. CSW explained the transportation. Pt in agreement. CSW obtained permission to f/u with [facility]. CSW called and spoke to [place] who will review and see if she can offer a bed for SNF placement.  Employment status:  Retired Forensic scientist:  Medicare PT Recommendations:  [place] / Referral to community resources:  [place]  Patient/Family's Response to care:  Patient appreciative of CSW assistance with SNF placement. No issues or concerns identified.  Patient/Family's Understanding  of and Emotional Response to Diagnosis, Current Treatment, and Prognosis:  Patient has good understanding of diagnosis, current treatment and prognosis. Pt looking forward to going to [facility] for rehab and transitioning to ALF. No issues or concerns identified.  Emotional Assessment Appearance:  Appears stated age Attitude/Demeanor/Rapport:   (Cooperative) Affect (typically observed):  Accepting, Appropriate Orientation:  Oriented to Situation, Oriented to  Time, Oriented to Place, Oriented to Self Alcohol / Substance use:  Not Applicable Psych involvement (Current and /or in the community):  No (Comment)  Discharge Needs  Concerns to be addressed:  Care Coordination Readmission within the last 30 days:  Yes Current discharge risk:  Physical Impairment, Cognitively Impaired Barriers to Discharge:  No Barriers Identified   [name], LCSW 06/17/2017, 2:59 PM

## 2017-06-17 NOTE — Op Note (Signed)
NAME:  [name], [name]                   ACCOUNT NO.:  MEDICAL RECORD NO.:  [id number]  LOCATION:                                 FACILITY:  PHYSICIAN:  Anderson Malta, M.D.    DATE OF BIRTH:  1945/12/25  DATE OF PROCEDURE: DATE OF DISCHARGE:                              OPERATIVE REPORT   PREOPERATIVE DIAGNOSIS:  Left shoulder arthritis.  POSTOPERATIVE DIAGNOSIS:  Left shoulder arthritis.  PROCEDURE:  Left total shoulder replacement.  SURGEON:  Anderson Malta, M.D.  ASSISTANT:  Laure Kidney, RNFA.  IMPLANTS:  Arthrex 155, reverse shoulder arthroplasty stem size 6, +3 liner, +2 mm inferior offset, small glenosphere.  INDICATIONS:  [name] is a 71 year old patient with left shoulder arthritis, presents for operative management after explanation of risks and benefits.  PROCEDURE IN DETAIL:  The patient was brought to the operating room where general anesthetic was induced.  Preoperative antibiotics were administered.  Time-out was called.  The patient was placed in the beach- chair position with her head in neutral position.  SCDs were placed. The left arm, shoulder and hand prescribed with alcohol and Betadine, allowed to air dry, prepped with DuraPrep solution and draped in a sterile manner.  Charlie Pitter was used to cover the operative field. Deltopectoral approach was made.  Skin and subcutaneous tissue were sharply divided.  The cephalic vein was mobilized medially.  Kolbel retractor was placed.  At this time, the patient was noted to have a rotator cuff tear of the supraspinatus.  Biceps tendon was identified and tenodesed to the pectoralis major tendon, which was partially released.  The subscapularis was then detached.  It should be noted that the circumflex vessels were identified and tied.  The axillary nerve was then visualized, palpated and the vessel loop was placed around it and it was protected at all times during the case.  The subscapularis was taken down  circumferentially and the inferior capsule was released off the humeral neck for about 2 cm.  At this time, the rotator interval was opened up all the way to the base of the coracoid.  Subscap was then detached.  Capsular release was performed circumferentially around the glenoid.  Head was cut at 155 degrees.  This was due to the fact that the patient used her arms in extension and adduction in order to lift from a chair.  At this time, the head was cut in the infraspinatus, which had been repaired was intact.  Good cut was made and the metal cap was placed.  Posterior and anterior retractors were placed around the glenoid.  Labrum was excised along with the rest of the superior biceps along with the biceps tendon.  In accordance with preoperative templating, the pin was placed in the center portion of the glenoid vault.  Broaching and reaming were performed and the base plate was placed with very good fixation achieved with the central peg.  Two peripheral locking screws were placed.  Good fixation achieved.  At this time, thorough irrigation was performed and the glenosphere was placed. The glenosphere was +2 inferior offset small glenosphere.  At this time, proximal reaming was performed to saucerize the  proximal femur.  Then, the true stem was placed along with the cup.  A +3 trial was placed and it was difficult to reduce, had a nice pop when it did reduce and was difficult to dislocate.  Particularly, the patient had good forward flexion, abduction, adduction, and in the position of extension and adduction, the shoulder was stable.  At this time, trial component removed and true component placed.  Thorough irrigation performed. Vancomycin powder placed.  Subscap repaired back to the lesser tuberosity.  This was done with the arm in about 30 degrees of external rotation.  At this time, thorough irrigation again performed and the deltopectoral interval was closed using #1 Vicryl  suture followed by interrupted inverted 0 Vicryl suture, 2-0 Vicryl suture and a 3-0 Monocryl.  Aquacel dressing placed.  Shoulder immobilizer placed.  The patient tolerated the procedure well without immediate complication, transferred to the recovery room in stable condition.  Axillary nerve was palpated at the conclusion of the case and found to be completely intact.     Anderson Malta, M.D.     GSD/MEDQ  D:  06/15/2017  T:  06/15/2017  Job:  [id number]

## 2017-06-17 NOTE — Social Work (Signed)
Pt wants to go to [facility] for rehabilitation. CSW called Abigail Butts in admission to follow-up.  CSW will continue to follow.  Elissa Hefty, LCSW Clinical Social Worker [phone]

## 2017-06-17 NOTE — Progress Notes (Signed)
Subjective: Pt stable - walking in hall - pain ok   Objective: Vital signs in last 24 hours: Temp:  [97.9 F (36.6 C)-99 F (37.2 C)] 97.9 F (36.6 C) (10/25 0451) Pulse Rate:  [74-82] 79 (10/25 1013) Resp:  [15-17] 15 (10/25 0451) BP: (111-143)/(38-56) 143/41 (10/25 1013) SpO2:  [93 %-98 %] 93 % (10/25 0451)  Intake/Output from previous day: 10/24 0701 - 10/25 0700 In: 1392.5 [P.O.:240; I.V.:1152.5] Out: 2000 [Urine:2000] Intake/Output this shift: Total I/O In: 240 [P.O.:240] Out: 2000 [Urine:2000]  Exam:  Neurovascular intact Sensation intact distally Intact pulses distally  Labs:  Recent Labs  06/15/17 1019  HGB 11.0*    Recent Labs  06/15/17 1019  WBC 7.8  RBC 3.80*  HCT 34.4*  PLT 264    Recent Labs  06/15/17 1019  NA 140  K 3.8  CL 104  CO2 26  BUN 22*  CREATININE 1.46*  GLUCOSE 99  CALCIUM 10.1   No results for input(s): LABPT, INR in the last 72 hours.  Assessment/Plan: Plan dc to snf am   G [name] 06/17/2017, 12:07 PM

## 2017-06-17 NOTE — NC FL2 (Signed)
[place] MEDICAID FL2 LEVEL OF CARE SCREENING TOOL     IDENTIFICATION  Patient Name: [name] Birthdate: 09-14-1945 Sex: female Admission Date (Current Location): 06/15/2017  [facility] and [place] Number:  Herbalist and Address:  [facility] [place], [place] [address] [place], [place] [address]      Provider Number: [id number]  Attending Physician Name and Address:  Meredith Pel, MD  Relative Name and Phone Number:  [name], [phone]    Current Level of Care: Hospital Recommended Level of Care: [place] Prior Approval Number:    Date Approved/Denied:   PASRR Number: [id number]  Discharge Plan: SNF    Current Diagnoses: Patient Active Problem List   Diagnosis Date Noted  . Shoulder arthritis 06/15/2017  . Status post revision of total knee replacement, left 04/29/2017  . 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 Unstable angina ([place]) 05/21/2016  . Chronic diastolic heart failure ([place]) 03/29/2016  . Normal coronary arteries 2009 01/14/2015  . Obesity-BMI 40 01/14/2015  . Restless leg 01/14/2015  . Chest pain 01/14/2015  . Back pain 01/14/2015  . Renal insufficiency 01/14/2015  . Dementia- mild memory issues 01/14/2015  . Occult blood positive stool 12/14/2014  . Anemia 12/14/2014  . Family history of colon cancer 12/14/2014  . Change in bowel habits 12/14/2014  . GERD (gastroesophageal reflux disease) 12/14/2014  . Dyspnea 05/17/2012  . Lymphedema 05/17/2012  . Weight gain 05/17/2012  . HTN (hypertension) 05/17/2012    Orientation RESPIRATION BLADDER Height & Weight     Self, Time, Situation, Place  Normal Continent Weight: 266 lb (120.7 kg) Height:  5\' 6"  (167.6 cm)  BEHAVIORAL SYMPTOMS/MOOD NEUROLOGICAL BOWEL NUTRITION STATUS   Continent Diet (See DC Summary)  AMBULATORY STATUS COMMUNICATION OF NEEDS Skin   Extensive Assist Verbally Surgical wounds                       Personal Care Assistance Level of Assistance  Dressing, Bathing, Feeding Bathing Assistance: Maximum assistance Feeding assistance: Limited assistance Dressing Assistance: Maximum assistance     Functional Limitations Info  Sight, Hearing, Speech Sight Info: Adequate Hearing Info: Adequate Speech Info: Adequate    SPECIAL CARE FACTORS FREQUENCY  PT (By licensed PT), OT (By licensed OT)     PT Frequency: 3x week OT Frequency: 2x week            Contractures Contractures Info: Not present    Additional Factors Info  Code Status, Allergies, Psychotropic Code Status Info: Full code Allergies Info: No Known Allergies Psychotropic Info: Cymbalta         Current Medications (06/17/2017):  This is the current hospital active medication list Current Facility-Administered Medications  Medication Dose Route Frequency Provider Last Rate Last Dose  . acetaminophen (TYLENOL) tablet 650 mg  650 mg Oral Q4H PRN Meredith Pel, MD       Or  . acetaminophen (TYLENOL) suppository 650 mg  650 mg Rectal Q4H PRN Meredith Pel, MD      . amLODipine (NORVASC) tablet 10 mg  10 mg Oral Daily Meredith Pel, MD   10 mg at 06/17/17 1018  . aspirin EC tablet 325 mg  325 mg Oral Daily Meredith Pel, MD   325 mg at 06/17/17 1018  . baclofen (LIORESAL) tablet 10 mg  10 mg Oral TID Meredith Pel, MD   10 mg at 06/17/17 1018  . donepezil (ARICEPT) tablet 10 mg  10 mg Oral QHS Meredith Pel, MD   10 mg at 06/16/17 2303  . DULoxetine (CYMBALTA) DR capsule 30 mg  30 mg Oral QHS Meredith Pel, MD   30 mg at 06/16/17 2302  . escitalopram (LEXAPRO) tablet 10 mg  10 mg Oral Daily Meredith Pel, MD   10 mg at 06/17/17 1018  . famotidine (PEPCID) tablet 20 mg  20 mg Oral Daily Meredith Pel, MD   20 mg at  06/17/17 1018  . fentaNYL (DURAGESIC - dosed mcg/hr) patch 25 mcg  25 mcg Transdermal Q72H Meredith Pel, MD   25 mcg at 06/15/17 2201  . fluticasone (FLONASE) 50 MCG/ACT nasal spray 1 spray  1 spray Each Nare Daily Meredith Pel, MD   1 spray at 06/17/17 1018  . folic acid (FOLVITE) tablet 1 mg  1 mg Oral BID Meredith Pel, MD   1 mg at 06/17/17 1018  . gabapentin (NEURONTIN) capsule 100 mg  100 mg Oral QHS Meredith Pel, MD   100 mg at 06/16/17 2302  . HYDROcodone-acetaminophen (NORCO) 10-325 MG per tablet 1 tablet  1 tablet Oral Q4H PRN Meredith Pel, MD   1 tablet at 06/17/17 1026  . lamoTRIgine (LAMICTAL) tablet 100 mg  100 mg Oral Daily Meredith Pel, MD   100 mg at 06/17/17 1018  . levothyroxine (SYNTHROID, LEVOTHROID) tablet 75 mcg  75 mcg Oral QAC breakfast Meredith Pel, MD   75 mcg at 06/17/17 [phone]  . memantine (NAMENDA) tablet 10 mg  10 mg Oral BID Meredith Pel, MD   10 mg at 06/17/17 1018  . menthol-cetylpyridinium (CEPACOL) lozenge 3 mg  1 lozenge Oral PRN Meredith Pel, MD       Or  . phenol (CHLORASEPTIC) mouth spray 1 spray  1 spray Mouth/Throat PRN Meredith Pel, MD      . metoCLOPramide (REGLAN) tablet 5-10 mg  5-10 mg Oral Q8H PRN Meredith Pel, MD       Or  . metoCLOPramide (REGLAN) injection 5-10 mg  5-10 mg Intravenous Q8H PRN Meredith Pel, MD      . ondansetron [facility]) tablet 4 mg  4 mg Oral Q6H PRN Meredith Pel, MD       Or  . ondansetron [facility]) injection 4 mg  4 mg Intravenous Q6H PRN Meredith Pel, MD      . pravastatin (PRAVACHOL) tablet 80 mg  80 mg Oral QHS Meredith Pel, MD   80 mg at 06/16/17 2302  . rOPINIRole (REQUIP) tablet 1 mg  1 mg Oral QID Meredith Pel, MD   1 mg at 06/17/17 1018  . senna-docusate (Senokot-S) tablet 2 tablet  2 tablet Oral QHS Meredith Pel, MD   2 tablet at 06/16/17 2302  . torsemide (DEMADEX) tablet 80 mg  80 mg Oral Daily Meredith Pel, MD   80 mg at 06/17/17 1018  . traZODone (DESYREL) tablet 50 mg  50 mg Oral QHS Meredith Pel, MD   50 mg at 06/16/17 2303     Discharge Medications: Please see discharge summary for a list of discharge medications.  Relevant Imaging Results:  Relevant Lab Results:   Additional Information SS#:[id number] Aneta, LCSW

## 2017-06-17 NOTE — Progress Notes (Signed)
Physical Therapy Treatment Patient Details Name: [name] MRN: [id number] DOB: 04-14-1946 Today's Date: 06/17/2017    History of Present Illness Pt is a 71 y.o. female s/p reservse left shoulder arthroplasty. PMH includes but not limited to anemia, anxiety, arthritis, brain lesion, cervical spondylosis wtih myelopathy, chronic low back pain, CKD, CHF, dementia, depression, dumping syndrome, dyspnea, edema, fatigue, fibromyalgia, GERD, hyperlipidemia, HTN, lymphedema, OSA on CPAP, PONV, restless leg syndrome, RA, stroke, syncope, DM. PSH includes but not limited to: right heart cath (2018), LTKA and I&D (02/2017).     PT Comments    Patient progressing with ambulation distance, speed, stride length, but still shuffling and with high fall risk due to decreased balance.  Lot of assist for bed mobility and continues to require SNF level rehab at d/c.     Follow Up Recommendations  SNF;Supervision/Assistance - 24 hour     Equipment Recommendations  Other (comment) (L platform for walker)    Recommendations for Other Services       Precautions / Restrictions Precautions Precautions: Shoulder;Fall Type of Shoulder Precautions: Passvie Protocol. NWB (OK for walker). Hand, wrist, elbow exercises OK. Pendulums OK.  FF 90, ABD 60, ER 30.  Shoulder Interventions: Shoulder sling/immobilizer;Off for dressing/bathing/exercises Required Braces or Orthoses: Sling Restrictions LUE Weight Bearing: Weight bear through elbow only    Mobility  Bed Mobility Overal bed mobility: Needs Assistance Bed Mobility: Supine to Sit;Sit to Supine     Supine to sit: Mod assist;HOB elevated Sit to supine: Mod assist   General bed mobility comments: assist to lift trunk and scoot to EOB, assist for legs into bed for supine  Transfers Overall transfer level: Needs assistance Equipment used: Left platform walker Transfers: Sit to/from Stand Sit to Stand: Mod assist;From elevated surface             Ambulation/Gait   Ambulation Distance (Feet): 120 Feet Assistive device: Left platform walker Gait Pattern/deviations: Step-to pattern;Decreased stride length;Shuffle;Trunk flexed;Wide base of support     General Gait Details: increased stride length some during session, mainly short shuffling and L antalgia   Stairs            Wheelchair Mobility    Modified Rankin (Stroke Patients Only)       Balance Overall balance assessment: Needs assistance Sitting-balance support: No upper extremity supported;Feet supported Sitting balance-Leahy Scale: Fair     Standing balance support: Single extremity supported;No upper extremity supported;During functional activity Standing balance-Leahy Scale: Poor Standing balance comment: UE support for balabnce                            Cognition Arousal/Alertness: Awake/alert Behavior During Therapy: WFL for tasks assessed/performed Overall Cognitive Status: Within Functional Limits for tasks assessed                                        Exercises      General Comments        Pertinent Vitals/Pain Faces Pain Scale: Hurts whole lot Pain Location: Left shoulder Pain Descriptors / Indicators: Aching;Sore;Operative site guarding Pain Intervention(s): Monitored during session;Repositioned    Home Living                      Prior Function            PT Goals (current goals can now  be found in the care plan section) Progress towards PT goals: Progressing toward goals    Frequency    Min 3X/week      PT Plan Current plan remains appropriate    Co-evaluation              AM-PAC PT "6 Clicks" Daily Activity  Outcome Measure  Difficulty turning over in bed (including adjusting bedclothes, sheets and blankets)?: Unable Difficulty moving from lying on back to sitting on the side of the bed? : Unable Difficulty sitting down on and standing up from a chair with arms  (e.g., wheelchair, bedside commode, etc,.)?: Unable Help needed moving to and from a bed to chair (including a wheelchair)?: A Lot Help needed walking in hospital room?: A Lot Help needed climbing 3-5 steps with a railing? : Total 6 Click Score: 8    End of Session Equipment Utilized During Treatment: Gait belt;Other (comment) (sling)   Patient left: in bed;with call bell/phone within reach   PT Visit Diagnosis: Pain;Other abnormalities of gait and mobility (R26.89) Pain - Right/Left: Left Pain - part of body: Shoulder     Time: 6270-3500 PT Time Calculation (min) (ACUTE ONLY): 26 min  Charges:  $Gait Training: 8-22 mins $Therapeutic Activity: 8-22 mins                    G CodesMagda Kiel, [place] [phone] 06/17/2017    Reginia Naas 06/17/2017, 5:03 PM

## 2017-06-17 NOTE — Discharge Summary (Signed)
Physician Discharge Summary  Patient ID: [name] MRN: [id number] DOB/AGE: July 14, 1946 71 y.o.  Admit date: 06/15/2017 Discharge date: 06/18/2017  Admission Diagnoses:  Active Problems:   Shoulder arthritis   Discharge Diagnoses:  Same  Surgeries: Procedure(s): REVERSE LEFT SHOULDER ARTHROPLASTY on 06/15/2017   Consultants:   Discharged Condition: Stable  Hospital Course: [name] is an 71 y.o. female who was admitted 06/15/2017 with a chief complaint of shoulder pain, and found to have a diagnosis of shoulder arthritis.  They were brought to the operating room on 06/15/2017 and underwent the above named procedures.  Did well with OT and PT. - Pain controlled on dc.ok for aarom and prom left arm. dced in good condition for rehab at snf - f/u 10 days  Antibiotics given:  Anti-infectives    Start     Dose/Rate Route Frequency Ordered Stop   06/15/17 2030  ceFAZolin (ANCEF) IVPB 2g/100 mL premix     2 g 200 mL/hr over 30 Minutes Intravenous Every 6 hours 06/15/17 2007 06/16/17 0146   06/15/17 1550  vancomycin (VANCOCIN) powder  Status:  Discontinued       As needed 06/15/17 1550 06/15/17 1645   06/15/17 1220  ceFAZolin (ANCEF) 2-4 GM/100ML-% IVPB    Comments:  Theodoro Grist   : cabinet override      06/15/17 1220 06/15/17 1300    .  Recent vital signs:  Vitals:   06/17/17 0451 06/17/17 1013  BP: (!) 132/43 (!) 143/41  Pulse: 74 79  Resp: 15   Temp: 97.9 F (36.6 C)   SpO2: 93%     Recent laboratory studies:  Results for orders placed or performed during the hospital encounter of 66/44/03  Basic metabolic panel  Result Value Ref Range   Sodium 140 135 - 145 mmol/L   Potassium 3.8 3.5 - 5.1 mmol/L   Chloride 104 101 - 111 mmol/L   CO2 26 22 - 32 mmol/L   Glucose, Bld 99 65 - 99 mg/dL   BUN 22 (H) 6 - 20 mg/dL   Creatinine, Ser 1.46 (H) 0.44 - 1.00 mg/dL   Calcium 10.1 8.9 - 10.3 mg/dL   GFR calc non Af Amer 35 (L) >60 mL/min   GFR calc Af Amer 40  (L) >60 mL/min   Anion gap 10 5 - 15  CBC  Result Value Ref Range   WBC 7.8 4.0 - 10.5 K/uL   RBC 3.80 (L) 3.87 - 5.11 MIL/uL   Hemoglobin 11.0 (L) 12.0 - 15.0 g/dL   HCT 34.4 (L) 36.0 - 46.0 %   MCV 90.5 78.0 - 100.0 fL   MCH 28.9 26.0 - 34.0 pg   MCHC 32.0 30.0 - 36.0 g/dL   RDW 16.5 (H) 11.5 - 15.5 %   Platelets 264 150 - 400 K/uL  Glucose, capillary  Result Value Ref Range   Glucose-Capillary 81 65 - 99 mg/dL  Glucose, capillary  Result Value Ref Range   Glucose-Capillary 92 65 - 99 mg/dL   Comment 1 Notify RN    Comment 2 Document in Chart   Glucose, capillary  Result Value Ref Range   Glucose-Capillary 128 (H) 65 - 99 mg/dL  Glucose, capillary  Result Value Ref Range   Glucose-Capillary 87 65 - 99 mg/dL    Discharge Medications:   Allergies as of 06/17/2017   No Known Allergies     Medication List    STOP taking these medications   acetaminophen 500 MG tablet  Commonly known as:  TYLENOL   HYDROcodone-acetaminophen 7.5-325 MG tablet Commonly known as:  NORCO Replaced by:  HYDROcodone-acetaminophen 10-325 MG tablet     TAKE these medications   alendronate 70 MG tablet Commonly known as:  FOSAMAX Take 70 mg by mouth every Friday. Take with a full glass of water on an empty stomach.   amLODipine 10 MG tablet Commonly known as:  NORVASC Take 1 tablet (10 mg total) by mouth daily.   aspirin 325 MG EC tablet Take 1 tablet (325 mg total) by mouth daily. What changed:  medication strength  how much to take   baclofen 10 MG tablet Commonly known as:  LIORESAL Take 10 mg by mouth 3 (three) times daily.   donepezil 10 MG tablet Commonly known as:  ARICEPT Take 10 mg by mouth at bedtime.   DULoxetine 30 MG capsule Commonly known as:  CYMBALTA Take 30 mg by mouth at bedtime.   escitalopram 10 MG tablet Commonly known as:  LEXAPRO Take 10 mg by mouth daily.   fentaNYL 25 MCG/HR patch Commonly known as:  DURAGESIC - dosed mcg/hr Place 25 mcg onto  the skin every 3 (three) days.   fluticasone 50 MCG/ACT nasal spray Commonly known as:  FLONASE Place 1 spray into the nose daily.   folic acid 1 MG tablet Commonly known as:  FOLVITE Take 1 mg by mouth 2 (two) times daily.   gabapentin 100 MG capsule Commonly known as:  NEURONTIN Take 100 mg by mouth at bedtime.   HYDROcodone-acetaminophen 10-325 MG tablet Commonly known as:  NORCO Take 1 tablet by mouth every 4 (four) hours as needed for moderate pain. Replaces:  HYDROcodone-acetaminophen 7.5-325 MG tablet   lamoTRIgine 100 MG tablet Commonly known as:  LAMICTAL Take 100 mg by mouth daily.   levothyroxine 75 MCG tablet Commonly known as:  SYNTHROID, LEVOTHROID Take 75 mcg by mouth daily before breakfast.   memantine 10 MG tablet Commonly known as:  NAMENDA Take 10 mg by mouth 2 (two) times daily.   pravastatin 80 MG tablet Commonly known as:  PRAVACHOL Take 80 mg by mouth at bedtime.   ranitidine 150 MG tablet Commonly known as:  ZANTAC Take 150 mg by mouth 2 (two) times daily.   rOPINIRole 1 MG tablet Commonly known as:  REQUIP Take 1 mg by mouth 4 (four) times daily.   sennosides-docusate sodium 8.6-50 MG tablet Commonly known as:  SENOKOT-S Take 2 tablets by mouth at bedtime.   torsemide 20 MG tablet Commonly known as:  DEMADEX Take 80 mg by mouth daily.   torsemide 20 MG tablet Commonly known as:  DEMADEX Take 20-60 mg by mouth daily as needed (for fluid retention).   traZODone 50 MG tablet Commonly known as:  DESYREL Take 50 mg by mouth at bedtime.   Turmeric 500 MG Caps Take 500 mg by mouth daily.   VITAMIN B-12 IJ Inject as directed See admin instructions. Weekly for 4 weeks, has 1 more dose this week (week of 7/2) then will switch to once a month       Diagnostic Studies: Ct Shoulder Left Wo Contrast  Result Date: 05/31/2017 CLINICAL DATA:  Left shoulder pain and decreased range of motion since a fall 04/19/2017. Subsequent encounter.  EXAM: CT OF THE UPPER LEFT EXTREMITY WITHOUT CONTRAST TECHNIQUE: Multidetector CT imaging of the upper left extremity was performed according to the standard protocol. COMPARISON:  Plain films left shoulder 04/19/2017. FINDINGS: Bones/Joint/Cartilage There is no fracture or  dislocation. The patient has moderately severe appearing glenohumeral osteoarthritis with a prominent osteophyte off the medial aspect of the inferior humeral head. There is some flattening and remodeling of the glenoid. Moderate acromioclavicular osteoarthritis is also identified. Ligaments Suboptimally assessed by CT. Muscles and Tendons Musculature of the shoulder girdle is preserved. No rotator cuff tear is identified. Soft tissues Atherosclerotic vascular disease is noted. Imaged lung parenchyma is clear. IMPRESSION: No acute abnormality. Acromioclavicular and glenohumeral osteoarthritis appears worst at the glenohumeral joint. Atherosclerosis. Electronically Signed   By: Inge Rise M.D.   On: 05/31/2017 14:48   Dg Shoulder Left Port  Result Date: 06/15/2017 CLINICAL DATA:  Shoulder arthritis. EXAM: LEFT SHOULDER - 1 VIEW COMPARISON:  Shoulder CT 05/31/2017 FINDINGS: Reverse glenohumeral arthroplasty without periprosthetic fracture or subluxation. IMPRESSION: Reverse glenohumeral arthroplasty without acute finding. Electronically Signed   By: Monte Fantasia M.D.   On: 06/15/2017 18:46    Disposition: 01-Home or Self Care  Discharge Instructions    Call MD / Call 911    Complete by:  As directed    If you experience chest pain or shortness of breath, CALL 911 and be transported to the hospital emergency room.  If you develope a fever above 101 F, pus (Schurman drainage) or increased drainage or redness at the wound, or calf pain, call your surgeon's office.   Constipation Prevention    Complete by:  As directed    Drink plenty of fluids.  Prune juice may be helpful.  You may use a stool softener, such as Colace (over the  counter) 100 mg twice a day.  Use MiraLax (over the counter) for constipation as needed.   Diet - low sodium heart healthy    Complete by:  As directed    Discharge instructions    Complete by:  As directed    PT daily for prom and aarom left shoulder Ok to weight bear through left shoulder using walker Remove aquicell dressing in 10 days - ok to shower dressing waterproof   Increase activity slowly as tolerated    Complete by:  As directed          Signed: Anderson Malta 06/17/2017, 12:14 PM

## 2017-06-18 NOTE — Clinical Social Work Note (Signed)
CSW left voicemail for admissions coordinator at [facility] to follow up on if they can take her today.  Dayton Scrape, [place]

## 2017-06-18 NOTE — Clinical Social Work Placement (Signed)
   CLINICAL SOCIAL WORK PLACEMENT  NOTE  Date:  06/18/2017  Patient Details  Name: [name] MRN: [id number] Date of Birth: February 12, 1946  Clinical Social Work is seeking post-discharge placement for this patient at the [place] level of care (*CSW will initial, date and re-position this form in  chart as items are completed):  Yes   Patient/family provided with [place] Work Department's list of facilities offering this level of care within the geographic area requested by the patient (or if unable, by the patient's family).  Yes   Patient/family informed of their freedom to choose among providers that offer the needed level of care, that participate in Medicare, Medicaid or managed care program needed by the patient, have an available bed and are willing to accept the patient.  Yes   Patient/family informed of [facility]'s ownership interest in [facility] and [facility], as well as of the fact that they are under no obligation to receive care at these facilities.  PASRR submitted to EDS on       PASRR number received on 06/17/17     Existing PASRR number confirmed on       FL2 transmitted to all facilities in geographic area requested by pt/family on 06/17/17     FL2 transmitted to all facilities within larger geographic area on       Patient informed that his/her managed care company has contracts with or will negotiate with certain facilities, including the following:        Yes   Patient/family informed of bed offers received.  Patient [facility] bed at [facility]     Physician recommends and patient chooses bed at      Patient to be transferred to [facility] on 06/18/17.  Patient to be transferred to facility by [facility]     Patient family notified on 06/18/17 of transfer.  Name of family member notified:  Patient will call her family.     PHYSICIAN Please sign FL2, Please prepare prescriptions      Additional Comment:    _______________________________________________ Candie Chroman, LCSW 06/18/2017, 11:41 AM

## 2017-06-18 NOTE — Care Management Note (Signed)
Case Management Note  Patient Details  Name: [name] MRN: [id number] Date of Birth: 01/09/1946  Subjective/Objective:                 DC to SNF as facilitated by CSW.    Action/Plan:   Expected Discharge Date:  06/18/17               Expected Discharge Plan:  Skilled Nursing Facility  In-House Referral:  Clinical Social Work  Discharge planning Services  CM Consult  Post Acute Care Choice:    Choice offered to:     DME Arranged:    DME Agency:     HH Arranged:    [place] Agency:     Status of Service:  Completed, signed off  If discussed at [employer], dates discussed:    Additional Comments:  Carles Collet, RN 06/18/2017, 10:16 AM

## 2017-06-18 NOTE — Progress Notes (Signed)
Pt stable Moving well Plan dc to snf today

## 2017-06-18 NOTE — Therapy (Signed)
Occupational Therapy Treatment Patient Details Name: [name] MRN: [id number] DOB: 04/13/1946 Today's Date: 06/18/2017    History of present illness Pt is a 71 y.o. female s/p reservse left shoulder arthroplasty. PMH includes but not limited to anemia, anxiety, arthritis, brain lesion, cervical spondylosis wtih myelopathy, chronic low back pain, CKD, CHF, dementia, depression, dumping syndrome, dyspnea, edema, fatigue, fibromyalgia, GERD, hyperlipidemia, HTN, lymphedema, OSA on CPAP, PONV, restless leg syndrome, RA, stroke, syncope, DM. PSH includes but not limited to: right heart cath (2018), LTKA and I&D (02/2017).    OT comments  Focus of today's session in increased independence with ADLs and functional mobility. Pt able to use left platform walker with moderate assist for functional mobility. Pt requires total assist with LB ADLs and mod assist for bed mobility. Pt continues to require SNF level rehab at d/c. OT will continue to follow while admitted.    Follow Up Recommendations  SNF;Supervision/Assistance - 24 hour    Equipment Recommendations  Other (comment) (TBD at next venue of care)    Recommendations for Other Services      Precautions / Restrictions Precautions Precautions: Shoulder;Fall Type of Shoulder Precautions: Passvie Protocol. NWB (OK for walker). Hand, wrist, elbow exercises OK. Pendulums OK.  FF 90, ABD 60, ER 30.  Shoulder Interventions: Shoulder sling/immobilizer;Off for dressing/bathing/exercises Precaution Booklet Issued: Yes (comment) Precaution Comments: Verbally reviewed precautions with pt  Required Braces or Orthoses: Sling Restrictions Weight Bearing Restrictions: Yes RUE Weight Bearing: Non weight bearing LUE Weight Bearing: Weight bear through elbow only       Mobility Bed Mobility Overal bed mobility: Needs Assistance Bed Mobility: Sit to Supine     Supine to sit: Mod assist;HOB elevated Sit to supine: Mod assist   General bed  mobility comments: assist for legs into bed for supine and to adjust in bed   Transfers Overall transfer level: Needs assistance Equipment used: Left platform walker Transfers: Sit to/from Stand Sit to Stand: Mod assist;+2 physical assistance              Balance Overall balance assessment: Needs assistance Sitting-balance support: No upper extremity supported;Feet supported Sitting balance-Leahy Scale: Fair     Standing balance support: During functional activity;Bilateral upper extremity supported Standing balance-Leahy Scale: Poor Standing balance comment: UE support for balabnce                           ADL either performed or assessed with clinical judgement   ADL Overall ADL's : Needs assistance/impaired                     Lower Body Dressing: Total assistance;Sit to/from stand;Bed level Lower Body Dressing Details (indicate cue type and reason): Total assist at bed level to adjust pants              Functional mobility during ADLs: Moderate assistance;Rolling walker General ADL Comments: Reviewed UB ADLs with shoulder precautions.      Vision       Perception     Praxis      Cognition Arousal/Alertness: Awake/alert Behavior During Therapy: WFL for tasks assessed/performed Overall Cognitive Status: Within Functional Limits for tasks assessed                                          Exercises     Shoulder Instructions  Shoulder Instructions Donning/doffing shirt without moving shoulder: Moderate assistance Method for sponge bathing under operated UE: Moderate assistance Donning/doffing sling/immobilizer: Maximal assistance Correct positioning of sling/immobilizer: Maximal assistance Pendulum exercises (written home exercise program): Supervision/safety ROM for elbow, wrist and digits of operated UE: Supervision/safety Sling wearing schedule (on at all times/off for ADL's): Supervision/safety Proper positioning  of operated UE when showering: Minimal assistance Positioning of UE while sleeping: Minimal assistance     General Comments Reviewed shoulder exercises with pt. Pt reports doing exercises and that they have been going well. Planning to use CPM machine as ordered by MD    Pertinent Vitals/ Pain       Pain Assessment: Faces Faces Pain Scale: Hurts little more Pain Location: Lower back Pain Descriptors / Indicators: Aching;Sore Pain Intervention(s): Limited activity within patient's tolerance;Monitored during session  Home Living                                          Prior Functioning/Environment              Frequency  Min 2X/week        Progress Toward Goals  OT Goals(current goals can now be found in the care plan section)  Progress towards OT goals: Progressing toward goals  Acute Rehab OT Goals Patient Stated Goal: To get rehab  OT Goal Formulation: With patient Time For Goal Achievement: 06/30/17 Potential to Achieve Goals: Good ADL Goals Pt Will Perform Grooming: with supervision;sitting Pt Will Perform Upper Body Bathing: with supervision;sitting Pt Will Perform Upper Body Dressing: with supervision;sitting Pt Will Transfer to Toilet: with supervision;ambulating Pt Will Perform Toileting - Clothing Manipulation and hygiene: with supervision;sit to/from stand Pt/caregiver will Perform Home Exercise Program: Left upper extremity;With written HEP provided  Plan Discharge plan remains appropriate    Co-evaluation                 AM-PAC PT "6 Clicks" Daily Activity     Outcome Measure   Help from another person eating meals?: None Help from another person taking care of personal grooming?: A Little Help from another person toileting, which includes using toliet, bedpan, or urinal?: A Lot Help from another person bathing (including washing, rinsing, drying)?: A Lot Help from another person to put on and taking off regular upper  body clothing?: A Lot Help from another person to put on and taking off regular lower body clothing?: A Lot 6 Click Score: 15    End of Session Equipment Utilized During Treatment: Gait belt;Rolling walker  OT Visit Diagnosis: Other abnormalities of gait and mobility (R26.89)   Activity Tolerance Patient tolerated treatment well   Patient Left in bed;with call bell/phone within reach   Nurse Communication Mobility status        Time: 2376-2831 OT Time Calculation (min): 29 min  Charges: OT General Charges $OT Visit: 1 Visit OT Treatments $Self Care/Home Management : 23-37 mins  Boykin Peek, [place] #[phone]   Boykin Peek 06/18/2017, 4:29 PM

## 2017-06-18 NOTE — Progress Notes (Signed)
Reviewed discharge papers and medications with patient called [place] [facility] for report spoke with Kristeen Mans

## 2017-06-18 NOTE — Care Management Important Message (Signed)
Important Message  Patient Details  Name: [name] MRN: [id number] Date of Birth: [birth year]   Medicare Important Message Given:  Yes    Betsie Peckman 06/18/2017, 1:30 PM

## 2017-06-18 NOTE — Clinical Social Work Note (Signed)
CSW facilitated patient discharge including contacting patient family and facility to confirm patient discharge plans. Clinical information faxed to facility and family agreeable with plan. CSW arranged ambulance transport via [place] to [facility] at 2:00 pm. RN to call report prior to discharge [phone]).  CSW will sign off for now as social work intervention is no longer needed. Please consult [place] again if new needs arise.  Dayton Scrape, [place]

## 2017-06-21 ENCOUNTER — Encounter (HOSPITAL_COMMUNITY): Payer: Self-pay | Admitting: Orthopedic Surgery

## 2017-06-25 ENCOUNTER — Telehealth (INDEPENDENT_AMBULATORY_CARE_PROVIDER_SITE_OTHER): Payer: Self-pay | Admitting: Orthopedic Surgery

## 2017-06-25 NOTE — Telephone Encounter (Signed)
Called patient left message to return call to reschedule appointment

## 2017-06-28 ENCOUNTER — Inpatient Hospital Stay (INDEPENDENT_AMBULATORY_CARE_PROVIDER_SITE_OTHER): Payer: Medicare Other | Admitting: Orthopedic Surgery

## 2017-06-29 ENCOUNTER — Ambulatory Visit (INDEPENDENT_AMBULATORY_CARE_PROVIDER_SITE_OTHER): Payer: No Typology Code available for payment source | Admitting: Orthopedic Surgery

## 2017-06-29 ENCOUNTER — Ambulatory Visit (INDEPENDENT_AMBULATORY_CARE_PROVIDER_SITE_OTHER): Payer: No Typology Code available for payment source

## 2017-06-29 ENCOUNTER — Encounter (INDEPENDENT_AMBULATORY_CARE_PROVIDER_SITE_OTHER): Payer: Self-pay | Admitting: Orthopedic Surgery

## 2017-06-29 DIAGNOSIS — Z96612 Presence of left artificial shoulder joint: Secondary | ICD-10-CM

## 2017-06-29 NOTE — Progress Notes (Signed)
Post-Op Visit Note   Patient: [name]           Date of Birth: 02-14-1946           MRN: [id number] Visit Date: 06/29/2017 PCP: Dione Housekeeper, MD   Assessment & Plan:  Chief Complaint:  Chief Complaint  Patient presents with  . Left Shoulder - Routine Post Op   Visit Diagnoses:  1. Status post reverse total replacement of left shoulder     Plan: [name] is now 2 weeks out left reverse shoulder replacement.  She's doing well.  She is working with physical therapy to achieve more range of motion.  She's not having any fevers and chills.  On exam incision has a little bit of redness laterally but not on the medial aspect.  I think this is likely a venous congestion from undermining of the skin.  Nonetheless I'll put her on doxycycline 100 mg twice a day for 10 days.  She is bathing herself.  4 week return.  Continue with self-directed physical therapy mostly for passive range of motion and active assisted range of motion.  She is able to push or self up out of the chair and walk with a walker.  No episodes of instability at this time.  Follow-Up Instructions: Return in about 4 weeks (around 07/27/2017).   Orders:  Orders Placed This Encounter  Procedures  . XR Shoulder Left   No orders of the defined types were placed in this encounter.   Imaging: Xr Shoulder Left  Result Date: 06/29/2017 AP lateral left shoulder reviewed.  Reverse shoulder prosthesis in good position and alignment.  No complicating features.   PMFS History: Patient Active Problem List   Diagnosis Date Noted  . Shoulder arthritis 06/15/2017  . Status post revision of total knee replacement, left 04/29/2017  . Polyethylene liner wear following left total knee arthroplasty requiring isolated polyethylene liner exchange ([place]) 03/02/2017  . 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 Unstable angina ([place]) 05/21/2016  . Chronic diastolic heart failure ([place]) 03/29/2016  .  Normal coronary arteries 2009 01/14/2015  . Obesity-BMI 40 01/14/2015  . Restless leg 01/14/2015  . Chest pain 01/14/2015  . Back pain 01/14/2015  . Renal insufficiency 01/14/2015  . Dementia- mild memory issues 01/14/2015  . Occult blood positive stool 12/14/2014  . Anemia 12/14/2014  . Family history of colon cancer 12/14/2014  . Change in bowel habits 12/14/2014  . GERD (gastroesophageal reflux disease) 12/14/2014  . Dyspnea 05/17/2012  . Lymphedema 05/17/2012  . Weight gain 05/17/2012  . HTN (hypertension) 05/17/2012   Past Medical History:  Diagnosis Date  . Anemia   . Anxiety   . Arthritis    "hands, feet" (03/03/2017)  . Brain lesion    2 types  . Cervical spondylosis with myelopathy   . Chest pain    Normal cardiac cath 5/09  . Chronic lower back pain   . CKD (chronic kidney disease), stage III ([place])   . Congestive heart failure (CHF) ([place])    has a Cardiomems implant   . Constipation   . Dementia   . Depression   . Dumping syndrome   . Dyspnea    with activity  . Edema   . Fatigue   . Fibromyalgia   . GERD (gastroesophageal reflux disease)   . Headache    "w/high CBG" (03/03/2017)  . History of echocardiogram    a. Echo 03/20/16 (done at [facility] in  [place], [place]):  mild LVH, EF 65%, normal diastolic function, mild LAE, MAC, RVSP 25 mmHg  . Hyperlipidemia   . Hypertension   . Hypothyroidism   . Lumbar spondylosis   . Lymphedema    seeing specialist for this  . Migraine    "mostly stopped when I changed my diet" (03/03/2017)  . OSA on CPAP   . Pneumonia X 1  . PONV (postoperative nausea and vomiting)   . Restless leg syndrome   . Rheumatoid arthritis ([place])   . Stroke [facility])    TIAs "mini strokes"- unsure of last TIA - pt and daughter deny this  . Syncope   . Tremors of nervous system   . Type II diabetes mellitus (HCC)     Family History  Problem Relation Age of Onset  . Heart disease Mother   . Heart failure Mother   . Kidney disease Mother    . Lung cancer Father   . Colon cancer Brother   . Cancer Brother        malignant thymoma  . Heart disease Brother   . Tremor Maternal Grandmother 90  . Ovarian cancer Maternal Grandmother   . Uterine cancer Maternal Grandmother   . Irritable bowel syndrome Daughter   . Esophageal cancer Neg Hx   . Stomach cancer Neg Hx   . Rectal cancer Neg Hx     Past Surgical History:  Procedure Laterality Date  . APPENDECTOMY  1966  . BACK SURGERY    . CATARACT EXTRACTION W/ INTRAOCULAR LENS  IMPLANT, BILATERAL Bilateral   . COLONOSCOPY    . CORONARY ANGIOGRAM  2009   Normal coronaries  . EXCISION/RELEASE BURSA HIP Bilateral   . JOINT REPLACEMENT    . KNEE ARTHROSCOPY Bilateral   . LAPAROSCOPIC CHOLECYSTECTOMY  2015  . LUMBAR DISC SURGERY    . LUMBAR LAMINECTOMY/DECOMPRESSION MICRODISCECTOMY Left 12/2005   L2-3 laminectomy and diskectomy/notes 01/06/2011  . POSTERIOR LUMBAR FUSION  04/2006   [name] 01/06/2011; "put cages in"  . REVISION TOTAL KNEE ARTHROPLASTY Left 03/02/2017   poly liner/notes 03/02/2017  . SHOULDER ARTHROSCOPY WITH ROTATOR CUFF REPAIR Right   . SHOULDER OPEN ROTATOR CUFF REPAIR Left 01/2011   [name] 01/29/2011  . TOTAL ABDOMINAL HYSTERECTOMY    . TOTAL KNEE ARTHROPLASTY Left   . TUBAL LIGATION    . UPPER GI ENDOSCOPY     Social History   Occupational History  . Occupation: retired  Tobacco Use  . Smoking status: Never Smoker  . Smokeless tobacco: Never Used  Substance and Sexual Activity  . Alcohol use: No    Alcohol/week: 0.0 oz  . Drug use: No  . Sexual activity: No

## 2017-07-05 ENCOUNTER — Ambulatory Visit (INDEPENDENT_AMBULATORY_CARE_PROVIDER_SITE_OTHER): Payer: Medicare Other | Admitting: Orthopaedic Surgery

## 2017-07-07 ENCOUNTER — Encounter (INDEPENDENT_AMBULATORY_CARE_PROVIDER_SITE_OTHER): Payer: Self-pay | Admitting: Orthopaedic Surgery

## 2017-07-07 ENCOUNTER — Ambulatory Visit (INDEPENDENT_AMBULATORY_CARE_PROVIDER_SITE_OTHER): Payer: No Typology Code available for payment source

## 2017-07-07 ENCOUNTER — Ambulatory Visit (INDEPENDENT_AMBULATORY_CARE_PROVIDER_SITE_OTHER): Payer: Medicare Other | Admitting: Orthopaedic Surgery

## 2017-07-07 DIAGNOSIS — Z96652 Presence of left artificial knee joint: Secondary | ICD-10-CM

## 2017-07-07 DIAGNOSIS — Z96612 Presence of left artificial shoulder joint: Secondary | ICD-10-CM | POA: Diagnosis not present

## 2017-07-07 NOTE — Progress Notes (Signed)
The patient is actually 23 days post a left reverse shoulder replacement by my partner Dr. Marlou Sa.  She saw Dr. Marlou Sa last week and been doing well.  He increased her activities on the CPM for her left shoulder up to 70 degrees 3 times a day.  Working in therapy yesterday she felt some kind of pop in her shoulder and is felt uncomfortable since then.  The knee that we performed a polyethylene liner exchange on months ago is done fine and she said there is no issues with that in 4 months out.  On examination of her left shoulder it is well located.  It is painful to try to put at the range of motion.  The incision is well-healed there is no evidence of infection or swelling.  I did obtain x-rays of her left shoulder today and compared these from x-rays from a week ago and I see no acute findings.  The shoulder is well located.  The x-rays appear equal from last week to this week.  I gave her reassurance that her shoulder does look okay but will have therapy back down her therapy for the next 2 weeks while she is in acute pain.  She will follow-up with Dr. Marlou Sa in 2 weeks but no x-rays are needed.  As far as her knee goes she will follow-up as needed.

## 2017-07-12 ENCOUNTER — Ambulatory Visit (HOSPITAL_COMMUNITY)
Admission: RE | Admit: 2017-07-12 | Discharge: 2017-07-12 | Disposition: A | Discharge status: Home / Self Care | Payer: Medicare Other | Source: Ambulatory Visit | Attending: Cardiology | Admitting: Cardiology

## 2017-07-12 ENCOUNTER — Telehealth (INDEPENDENT_AMBULATORY_CARE_PROVIDER_SITE_OTHER): Payer: Self-pay | Admitting: *Deleted

## 2017-07-12 DIAGNOSIS — I5032 Chronic diastolic (congestive) heart failure: Secondary | ICD-10-CM | POA: Diagnosis not present

## 2017-07-12 NOTE — Progress Notes (Signed)
   Cardiomems Update   RHC RA 4 PA 23/10 (15)  PCWP 5 PA 76% CO 9.1 CI 4.1      Implant Labs Creatinine 2.12 BUN 58 Potassium 3.9   PAD Range 6-13 Current PAD 6    meds - continue torsemide 80 mg /40 mg    Current Labs  06/15/2017 K 3.8 Creatinine 1.46   I have reviewed current PAD from cardiomems device. PAD reading stable. .  Continue current diuretic regimen. Follow readings at least weekly.   Amy Clegg NP-C  12:09 PM

## 2017-07-12 NOTE — Telephone Encounter (Signed)
Pt called left message on triage phone needing a new order for Unna boot(?) so she can get her legs wrap asap. Pt is at [facility]   Pt cb [phone]

## 2017-07-12 NOTE — Telephone Encounter (Signed)
Patient is at a facility. Not sure how you want to handle that. I was under assumption her attending physician at facility should address this. We have not even seen her for her legs.

## 2017-07-13 NOTE — Telephone Encounter (Signed)
IC to nursing facility, s/w nurse [facility] who is caring for patient. She states they never received rx written by Dr Marlou Sa for this. I have re-written this and faxed to them for bilateral unna and dynaflex weekkly. Faxed to [phone]

## 2017-07-13 NOTE — Telephone Encounter (Signed)
Paxton for this and f/u with duda for this too thx

## 2017-07-21 ENCOUNTER — Ambulatory Visit (INDEPENDENT_AMBULATORY_CARE_PROVIDER_SITE_OTHER): Payer: Medicare Other | Admitting: Orthopedic Surgery

## 2017-07-29 ENCOUNTER — Ambulatory Visit (INDEPENDENT_AMBULATORY_CARE_PROVIDER_SITE_OTHER): Payer: Medicare Other | Admitting: Orthopedic Surgery

## 2017-07-29 ENCOUNTER — Encounter (INDEPENDENT_AMBULATORY_CARE_PROVIDER_SITE_OTHER): Payer: Self-pay | Admitting: Orthopedic Surgery

## 2017-07-29 DIAGNOSIS — Z96612 Presence of left artificial shoulder joint: Secondary | ICD-10-CM

## 2017-07-30 ENCOUNTER — Encounter (INDEPENDENT_AMBULATORY_CARE_PROVIDER_SITE_OTHER): Payer: Self-pay | Admitting: Orthopedic Surgery

## 2017-07-30 NOTE — Progress Notes (Signed)
Post-Op Visit Note   Patient: [name]           Date of Birth: 1945-08-27           MRN: [id number] Visit Date: 07/29/2017 PCP: Dione Housekeeper, MD   Assessment & Plan:  Chief Complaint:  Chief Complaint  Patient presents with  . Left Shoulder - Follow-up   Visit Diagnoses:  1. Status post reverse total replacement of left shoulder     Plan: [name] is a patient who is now about 6 weeks out left reverse shoulder replacement.  She states she is doing well.  She states she is feeling stronger.  She is doing the CPM and using pulleys.  Pain is manageable.  She will be at skilled nursing for several more weeks.  She is able to push herself up minimal pain from a seated position shoulder.  On examination she has improving passive and no real pain when I move the shoulder plan at this time is to continue therapy and we will see her back in 6 weeks.  Follow-Up Instructions: Return in about 6 weeks (around 09/09/2017).   Orders:  No orders of the defined types were placed in this encounter.  No orders of the defined types were placed in this encounter.   Imaging: No results found.  PMFS History: Patient Active Problem List   Diagnosis Date Noted  . Shoulder arthritis 06/15/2017  . Status post revision of total knee replacement, left 04/29/2017  . Polyethylene liner wear following left total knee arthroplasty requiring isolated polyethylene liner exchange ([place]) 03/02/2017  . 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 Unstable angina ([place]) 05/21/2016  . Chronic diastolic heart failure ([place]) 03/29/2016  . Normal coronary arteries 2009 01/14/2015  . Obesity-BMI 40 01/14/2015  . Restless leg 01/14/2015  . Chest pain 01/14/2015  . Back pain 01/14/2015  . Renal insufficiency 01/14/2015  . Dementia- mild memory issues 01/14/2015  . Occult blood positive stool 12/14/2014  . Anemia 12/14/2014  . Family history of colon cancer 12/14/2014  . Change  in bowel habits 12/14/2014  . GERD (gastroesophageal reflux disease) 12/14/2014  . Dyspnea 05/17/2012  . Lymphedema 05/17/2012  . Weight gain 05/17/2012  . HTN (hypertension) 05/17/2012   Past Medical History:  Diagnosis Date  . Anemia   . Anxiety   . Arthritis    "hands, feet" (03/03/2017)  . Brain lesion    2 types  . Cervical spondylosis with myelopathy   . Chest pain    Normal cardiac cath 5/09  . Chronic lower back pain   . CKD (chronic kidney disease), stage III ([place])   . Congestive heart failure (CHF) ([place])    has a Cardiomems implant   . Constipation   . Dementia   . Depression   . Dumping syndrome   . Dyspnea    with activity  . Edema   . Fatigue   . Fibromyalgia   . GERD (gastroesophageal reflux disease)   . Headache    "w/high CBG" (03/03/2017)  . History of echocardiogram    a. Echo 03/20/16 (done at [facility] in [place], [place]):  mild LVH, EF 09%, normal diastolic function, mild LAE, MAC, RVSP 25 mmHg  . Hyperlipidemia   . Hypertension   . Hypothyroidism   . Lumbar spondylosis   . Lymphedema    seeing specialist for this  . Migraine    "mostly stopped when I changed my diet" (03/03/2017)  .  OSA on CPAP   . Pneumonia X 1  . PONV (postoperative nausea and vomiting)   . Restless leg syndrome   . Rheumatoid arthritis ([place])   . Stroke [facility])    TIAs "mini strokes"- unsure of last TIA - pt and daughter deny this  . Syncope   . Tremors of nervous system   . Type II diabetes mellitus (HCC)     Family History  Problem Relation Age of Onset  . Heart disease Mother   . Heart failure Mother   . Kidney disease Mother   . Lung cancer Father   . Colon cancer Brother   . Cancer Brother        malignant thymoma  . Heart disease Brother   . Tremor Maternal Grandmother 90  . Ovarian cancer Maternal Grandmother   . Uterine cancer Maternal Grandmother   . Irritable bowel syndrome Daughter   . Esophageal cancer Neg Hx   . Stomach cancer Neg Hx   . Rectal  cancer Neg Hx     Past Surgical History:  Procedure Laterality Date  . APPENDECTOMY  1966  . BACK SURGERY    . CARDIAC CATHETERIZATION N/A 05/25/2016   Procedure: Right/Left Heart Cath and Coronary Angiography;  Surgeon: Larey Dresser, MD;  Location: [facility];  Service: Cardiovascular;  Laterality: N/A;  . CATARACT EXTRACTION W/ INTRAOCULAR LENS  IMPLANT, BILATERAL Bilateral   . COLONOSCOPY    . CORONARY ANGIOGRAM  2009   Normal coronaries  . EXCISION/RELEASE BURSA HIP Bilateral   . I&D KNEE WITH POLY EXCHANGE Left 03/02/2017   Procedure: Left knee Revision of poly liner;  Surgeon: Mcarthur Rossetti, MD;  Location: [facility];  Service: Orthopedics;  Laterality: Left;  . JOINT REPLACEMENT    . KNEE ARTHROSCOPY Bilateral   . LAPAROSCOPIC CHOLECYSTECTOMY  2015  . LUMBAR DISC SURGERY    . LUMBAR LAMINECTOMY/DECOMPRESSION MICRODISCECTOMY Left 12/2005   L2-3 laminectomy and diskectomy/notes 01/06/2011  . POSTERIOR LUMBAR FUSION  04/2006   [name] 01/06/2011; "put cages in"  . REVERSE SHOULDER ARTHROPLASTY Left 06/15/2017   Procedure: REVERSE LEFT SHOULDER ARTHROPLASTY;  Surgeon: Meredith Pel, MD;  Location: [facility];  Service: Orthopedics;  Laterality: Left;  . REVISION TOTAL KNEE ARTHROPLASTY Left 03/02/2017   poly liner/notes 03/02/2017  . RIGHT HEART CATH N/A 11/13/2016   Procedure: Right Heart Cath with Cardiomems;  Surgeon: Larey Dresser, MD;  Location: [place] [facility];  Service: Cardiovascular;  Laterality: N/A;  . SHOULDER ARTHROSCOPY WITH ROTATOR CUFF REPAIR Right   . SHOULDER OPEN ROTATOR CUFF REPAIR Left 01/2011   [name] 01/29/2011  . TOTAL ABDOMINAL HYSTERECTOMY    . TOTAL KNEE ARTHROPLASTY Left   . TOTAL KNEE ARTHROPLASTY Right 11/18/2012   Procedure: TOTAL KNEE ARTHROPLASTY;  Surgeon: Yvette Rack., MD;  Location: [facility];  Service: Orthopedics;  Laterality: Right;  . TUBAL LIGATION    . UPPER GI ENDOSCOPY     Social History   Occupational History  .  Occupation: retired  Tobacco Use  . Smoking status: Never Smoker  . Smokeless tobacco: Never Used  Substance and Sexual Activity  . Alcohol use: No    Alcohol/week: 0.0 oz  . Drug use: No  . Sexual activity: No

## 2017-08-12 ENCOUNTER — Ambulatory Visit (HOSPITAL_COMMUNITY)
Admission: RE | Admit: 2017-08-12 | Discharge: 2017-08-12 | Disposition: A | Discharge status: Home / Self Care | Payer: Medicare Other | Source: Ambulatory Visit | Attending: Internal Medicine | Admitting: Internal Medicine

## 2017-08-12 DIAGNOSIS — Z4502 Encounter for adjustment and management of automatic implantable cardiac defibrillator: Secondary | ICD-10-CM | POA: Insufficient documentation

## 2017-08-12 DIAGNOSIS — I5032 Chronic diastolic (congestive) heart failure: Secondary | ICD-10-CM | POA: Diagnosis not present

## 2017-08-12 NOTE — Progress Notes (Signed)
   Cardiomems Update  RHC RA 4 PA 23/10 (15)  PCWP 5 PA 76% CO 9.1 CI 4.1     Implant Labs Creatinine 2.12 BUN 58 Potassium 3.9   PAD Range 6-13 Current PAD 10   meds - continue torsemide 80 mg /40 mg   Current Labs  06/15/2017 K 3.8 Creatinine 1.46   I have reviewed current PAD from cardiomems device. PAD reading stable.  Continue current diuretic regimen. Set up for BMET.      Amy Clegg NP-C  4:45 PM

## 2017-08-16 ENCOUNTER — Telehealth (HOSPITAL_COMMUNITY): Payer: Self-pay | Admitting: *Deleted

## 2017-08-16 NOTE — Telephone Encounter (Signed)
Cardiomems shows fluid is up.  Called to let patient know she needs to take Torsemide 80 mg Twice Daily for 2 days and then come in for labs next week.  I called patient on both lines but had to leave messages asking for her to call us back.

## 2017-08-18 NOTE — Progress Notes (Signed)
Order for bmet faxed to [facility] at [phone]

## 2017-08-18 NOTE — Telephone Encounter (Signed)
Spoke w/pt's daughter [name], she states pt is still at [facility] in [facility] and they are giving pt her meds so she is not exactly sure what pt is currently taking.  However she reports pt has really been going down hill the past week or so.  She states pt's legs are swollen all the way up to her thighs, much more so than normal.  Pt gets very fatigued easily and is having a hard time moving around.  She states the MD at the facility told her this week that they are concerned about pt and that pt may need to be admitted to hospital.  Advised pt's Cardiomems is supposed to be between 6 and 13, pt was 16 on 12/24 but is down to 14 today, so she does have some fluid.  Advised daughter will contact facility for update and possible med adjustment, and I will let her know what happens, she is thankful for call.  Attempted to call [facility] [phone]) was transferred to nursing area and phone rang multiple times with no answer, will try back soon.

## 2017-08-18 NOTE — Telephone Encounter (Signed)
Spoke w/RN at facility orders given to increase Torsemide to 80 mg BID for 2 days per Darrick Grinder, NP.  RN states pt has been refusing Torsemide lately.  Order faxed to them at [phone].  Spoke w/pt's daughter, advised of med change for 2 days, advised her to remind pt to take meds and not refuse them, she is agreeable.

## 2017-08-18 NOTE — Addendum Note (Signed)
Encounter addended by: Scarlette Calico, RN on: 08/18/2017 2:28 PM  Actions taken: Sign clinical note

## 2017-08-20 ENCOUNTER — Telehealth (HOSPITAL_COMMUNITY): Payer: Self-pay

## 2017-08-20 NOTE — Telephone Encounter (Signed)
Patients daughter called to report patient never received extra torsemide per order earlier this week for two days.  Unable to get RN on the phone at [facility] to confirm this.  However, Cardiomems reading today (reading of 6) had dropped 10 points since the torsemide order for two days was faxed on the 26th (reading of 16), indicating significant drop in volume status. Dtr reports continued BLEE, denies SOB or cough, no other s/s.  Per Jonni Sanger Continue tors 80/40 and may take extra 40 mg prn in pm for weight gain 3lbs or more overnight, increased swelling/dyspnea. Wrap legs for BLEE and follow recheck reading Monday. Orders faxed to [facility]. Will follow up on Cardiomems reading Monday.  Renee Pain, RN

## 2017-08-23 ENCOUNTER — Telehealth (HOSPITAL_COMMUNITY): Payer: Self-pay

## 2017-08-23 MED ORDER — TORSEMIDE 20 MG PO TABS: 80.0000 mg | ORAL_TABLET | Freq: Two times a day (BID) | ORAL | Status: DC

## 2017-08-23 NOTE — Telephone Encounter (Signed)
Patient's daughter called to report patient never received any of the orders faxed last week from our office (doubling torsemide, unna boots, etc). Also reports patient continues to worsen with BLEE and weight gain.  Not SOB but did have dry cough with small amount clear production occasionally over the weekend. Spoke with director of nursing to clarify miscommunication.  Director states they do not have these faxed orders in their computer system, and advised to fax any further orders to [phone]). Cardiomems today 16 (up from 6 on Friday--3 days ago). Per Dr. Aundra Dubin, order faxed to increase Torsemide to 80 mg twice daily and to apply unna boots bilaterally per protocol. Daughter made aware of all updates and appointment made this Wednesday on APP clinic schedule per Dr. Aundra Dubin as patient's weight is also up 20 lbs since admission to nursing facility past 1-2 months. Daughter aware and appreciative and will transport patient to appointment this Wednesday.  Renee Pain, RN

## 2017-08-23 NOTE — Telephone Encounter (Signed)
Patient's daughter calling again to inform us that [facility] is still not receiving any of our faxed orders. Spoke again with Director of Nursing Manuela Schwartz) who asked to fax orders for Torsemide 80 mg BID and unna boots to [phone]).  Also faxed to previously provided fax # [phone]). Both faxes successfully went through, and have gone through the other times sent earlier, though nursing staff at [facility] report never receiving them.  Renee Pain, RN

## 2017-08-25 ENCOUNTER — Encounter (HOSPITAL_COMMUNITY): Payer: Medicare Other

## 2017-09-01 ENCOUNTER — Ambulatory Visit (INDEPENDENT_AMBULATORY_CARE_PROVIDER_SITE_OTHER): Payer: Medicare Other | Admitting: Orthopedic Surgery

## 2017-09-01 ENCOUNTER — Encounter (INDEPENDENT_AMBULATORY_CARE_PROVIDER_SITE_OTHER): Payer: Self-pay | Admitting: Orthopedic Surgery

## 2017-09-01 DIAGNOSIS — Z96612 Presence of left artificial shoulder joint: Secondary | ICD-10-CM

## 2017-09-01 DIAGNOSIS — G894 Chronic pain syndrome: Secondary | ICD-10-CM

## 2017-09-03 ENCOUNTER — Encounter (INDEPENDENT_AMBULATORY_CARE_PROVIDER_SITE_OTHER): Payer: Self-pay | Admitting: Orthopedic Surgery

## 2017-09-03 ENCOUNTER — Encounter (HOSPITAL_COMMUNITY): Payer: Self-pay

## 2017-09-03 ENCOUNTER — Ambulatory Visit (HOSPITAL_COMMUNITY)
Admission: RE | Admit: 2017-09-03 | Discharge: 2017-09-03 | Disposition: A | Discharge status: Home / Self Care | Payer: Medicare Other | Source: Ambulatory Visit | Attending: Cardiology | Admitting: Cardiology

## 2017-09-03 VITALS — BP 138/78 | HR 90 | Wt 266.2 lb

## 2017-09-03 DIAGNOSIS — Z7983 Long term (current) use of bisphosphonates: Secondary | ICD-10-CM | POA: Insufficient documentation

## 2017-09-03 DIAGNOSIS — Z8249 Family history of ischemic heart disease and other diseases of the circulatory system: Secondary | ICD-10-CM | POA: Diagnosis not present

## 2017-09-03 DIAGNOSIS — E1122 Type 2 diabetes mellitus with diabetic chronic kidney disease: Secondary | ICD-10-CM | POA: Diagnosis not present

## 2017-09-03 DIAGNOSIS — F0390 Unspecified dementia without behavioral disturbance: Secondary | ICD-10-CM | POA: Insufficient documentation

## 2017-09-03 DIAGNOSIS — I890 Lymphedema, not elsewhere classified: Secondary | ICD-10-CM | POA: Diagnosis not present

## 2017-09-03 DIAGNOSIS — Z7982 Long term (current) use of aspirin: Secondary | ICD-10-CM | POA: Diagnosis not present

## 2017-09-03 DIAGNOSIS — G8929 Other chronic pain: Secondary | ICD-10-CM | POA: Insufficient documentation

## 2017-09-03 DIAGNOSIS — N183 Chronic kidney disease, stage 3 (moderate): Secondary | ICD-10-CM | POA: Insufficient documentation

## 2017-09-03 DIAGNOSIS — M549 Dorsalgia, unspecified: Secondary | ICD-10-CM | POA: Diagnosis not present

## 2017-09-03 DIAGNOSIS — I130 Hypertensive heart and chronic kidney disease with heart failure and stage 1 through stage 4 chronic kidney disease, or unspecified chronic kidney disease: Secondary | ICD-10-CM | POA: Insufficient documentation

## 2017-09-03 DIAGNOSIS — M797 Fibromyalgia: Secondary | ICD-10-CM | POA: Insufficient documentation

## 2017-09-03 DIAGNOSIS — I5032 Chronic diastolic (congestive) heart failure: Secondary | ICD-10-CM

## 2017-09-03 DIAGNOSIS — Z79890 Hormone replacement therapy (postmenopausal): Secondary | ICD-10-CM | POA: Insufficient documentation

## 2017-09-03 DIAGNOSIS — M25512 Pain in left shoulder: Secondary | ICD-10-CM | POA: Diagnosis not present

## 2017-09-03 DIAGNOSIS — I10 Essential (primary) hypertension: Secondary | ICD-10-CM

## 2017-09-03 DIAGNOSIS — Z7951 Long term (current) use of inhaled steroids: Secondary | ICD-10-CM | POA: Diagnosis not present

## 2017-09-03 DIAGNOSIS — N289 Disorder of kidney and ureter, unspecified: Secondary | ICD-10-CM | POA: Diagnosis not present

## 2017-09-03 DIAGNOSIS — G2581 Restless legs syndrome: Secondary | ICD-10-CM | POA: Insufficient documentation

## 2017-09-03 DIAGNOSIS — Z79899 Other long term (current) drug therapy: Secondary | ICD-10-CM | POA: Insufficient documentation

## 2017-09-03 LAB — BASIC METABOLIC PANEL WITH GFR
Anion gap: 10 (ref 5–15)
BUN: 32 mg/dL — ABNORMAL HIGH (ref 6–20)
CO2: 31 mmol/L (ref 22–32)
Calcium: 10.4 mg/dL — ABNORMAL HIGH (ref 8.9–10.3)
Chloride: 99 mmol/L — ABNORMAL LOW (ref 101–111)
Creatinine, Ser: 1.46 mg/dL — ABNORMAL HIGH (ref 0.44–1.00)
GFR calc Af Amer: 40 mL/min — ABNORMAL LOW
GFR calc non Af Amer: 35 mL/min — ABNORMAL LOW
Glucose, Bld: 98 mg/dL (ref 65–99)
Potassium: 4 mmol/L (ref 3.5–5.1)
Sodium: 140 mmol/L (ref 135–145)

## 2017-09-03 MED ORDER — TORSEMIDE 20 MG PO TABS: ORAL_TABLET | ORAL | 3 refills | Status: DC

## 2017-09-03 MED ORDER — DOXYCYCLINE HYCLATE 100 MG PO TABS: 100.0000 mg | ORAL_TABLET | Freq: Two times a day (BID) | ORAL | 0 refills | Status: AC

## 2017-09-03 MED ORDER — BENZONATATE 100 MG PO CAPS: 100.0000 mg | ORAL_CAPSULE | Freq: Three times a day (TID) | ORAL | 0 refills | Status: DC | PRN

## 2017-09-03 NOTE — Patient Instructions (Signed)
Decrease Torsemide to 80 mg (4 tabs) in AM and 40 mg (2 tabs) in PM  Start Doxycycline 100 mg Twice daily FOR 7 DAYS ONLY  Take Tessalon for cough AS NEEDED  Labs today  Your physician recommends that you schedule a follow-up appointment in: 6-8 weeks

## 2017-09-03 NOTE — Progress Notes (Signed)
Post-Op Visit Note   Patient: [name]           Date of Birth: 08-23-1946           MRN: [id number] Visit Date: 09/01/2017 PCP: Dione Housekeeper, MD   Assessment & Plan:  Chief Complaint:  Chief Complaint  Patient presents with  . Left Shoulder - Follow-up   Visit Diagnoses:  1. Chronic pain syndrome   2. Status post reverse total replacement of left shoulder     Plan: [name] is a patient who is now about 2 months out left reverse shoulder replacement.  She has been doing reasonably well.  She is achieved about 50% of her shoulder range of motion.  She is transitioning to a regular walker with the help of home health PT.  We will also refer her to pain management because of pain pill issues.  On examination she has improving shoulder forward flexion and abduction.  Smooth motion of the replacement is noted with passive range of motion.  Deltoid fires.  Follow-up in 8 weeks for clinical recheck.  Continue with home health therapy in the meantime.  Follow-Up Instructions: Return in about 8 weeks (around 10/27/2017).   Orders:  Orders Placed This Encounter  Procedures  . Ambulatory referral to Pain Clinic   No orders of the defined types were placed in this encounter.   Imaging: No results found.  PMFS History: Patient Active Problem List   Diagnosis Date Noted  . Shoulder arthritis 06/15/2017  . Status post revision of total knee replacement, left 04/29/2017  . Polyethylene liner wear following left total knee arthroplasty requiring isolated polyethylene liner exchange ([place]) 03/02/2017  . 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 Unstable angina ([place]) 05/21/2016  . Chronic diastolic heart failure ([place]) 03/29/2016  . Normal coronary arteries 2009 01/14/2015  . Obesity-BMI 40 01/14/2015  . Restless leg 01/14/2015  . Chest pain 01/14/2015  . Back pain 01/14/2015  . Renal insufficiency 01/14/2015  . Dementia- mild memory issues  01/14/2015  . Occult blood positive stool 12/14/2014  . Anemia 12/14/2014  . Family history of colon cancer 12/14/2014  . Change in bowel habits 12/14/2014  . GERD (gastroesophageal reflux disease) 12/14/2014  . Dyspnea 05/17/2012  . Lymphedema 05/17/2012  . Weight gain 05/17/2012  . HTN (hypertension) 05/17/2012   Past Medical History:  Diagnosis Date  . Anemia   . Anxiety   . Arthritis    "hands, feet" (03/03/2017)  . Brain lesion    2 types  . Cervical spondylosis with myelopathy   . Chest pain    Normal cardiac cath 5/09  . Chronic lower back pain   . CKD (chronic kidney disease), stage III ([place])   . Congestive heart failure (CHF) ([place])    has a Cardiomems implant   . Constipation   . Dementia   . Depression   . Dumping syndrome   . Dyspnea    with activity  . Edema   . Fatigue   . Fibromyalgia   . GERD (gastroesophageal reflux disease)   . Headache    "w/high CBG" (03/03/2017)  . History of echocardiogram    a. Echo 03/20/16 (done at [facility] in [place], [place]):  mild LVH, EF 20%, normal diastolic function, mild LAE, MAC, RVSP 25 mmHg  . Hyperlipidemia   . Hypertension   . Hypothyroidism   . Lumbar spondylosis   . Lymphedema    seeing specialist for this  .  Migraine    "mostly stopped when I changed my diet" (03/03/2017)  . OSA on CPAP   . Pneumonia X 1  . PONV (postoperative nausea and vomiting)   . Restless leg syndrome   . Rheumatoid arthritis ([place])   . Stroke [facility])    TIAs "mini strokes"- unsure of last TIA - pt and daughter deny this  . Syncope   . Tremors of nervous system   . Type II diabetes mellitus (H[facility])     Family History  Problem Relation Age of Onset  . Heart disease Mother   . Heart failure Mother   . Kidney disease Mother   . Lung cancer Father   . Colon cancer Brother   . Cancer Brother        malignant thymoma  . Heart disease Brother   . Tremor Maternal Grandmother 90  . Ovarian cancer Maternal Grandmother   . Uterine cancer  Maternal Grandmother   . Irritable bowel syndrome Daughter   . Esophageal cancer Neg Hx   . Stomach cancer Neg Hx   . Rectal cancer Neg Hx     Past Surgical History:  Procedure Laterality Date  . APPENDECTOMY  1966  . BACK SURGERY    . CARDIAC CATHETERIZATION N/A 05/25/2016   Procedure: Right/Left Heart Cath and Coronary Angiography;  Surgeon: Larey Dresser, MD;  Location: [place] [facility];  Service: Cardiovascular;  Laterality: N/A;  . CATARACT EXTRACTION W/ INTRAOCULAR LENS  IMPLANT, BILATERAL Bilateral   . COLONOSCOPY    . CORONARY ANGIOGRAM  2009   Normal coronaries  . EXCISION/RELEASE BURSA HIP Bilateral   . I&D KNEE WITH POLY EXCHANGE Left 03/02/2017   Procedure: Left knee Revision of poly liner;  Surgeon: Mcarthur Rossetti, MD;  Location: [facility];  Service: Orthopedics;  Laterality: Left;  . JOINT REPLACEMENT    . KNEE ARTHROSCOPY Bilateral   . LAPAROSCOPIC CHOLECYSTECTOMY  2015  . LUMBAR DISC SURGERY    . LUMBAR LAMINECTOMY/DECOMPRESSION MICRODISCECTOMY Left 12/2005   L2-3 laminectomy and diskectomy/notes 01/06/2011  . POSTERIOR LUMBAR FUSION  04/2006   [name] 01/06/2011; "put cages in"  . REVERSE SHOULDER ARTHROPLASTY Left 06/15/2017   Procedure: REVERSE LEFT SHOULDER ARTHROPLASTY;  Surgeon: Meredith Pel, MD;  Location: [facility];  Service: Orthopedics;  Laterality: Left;  . REVISION TOTAL KNEE ARTHROPLASTY Left 03/02/2017   poly liner/notes 03/02/2017  . RIGHT HEART CATH N/A 11/13/2016   Procedure: Right Heart Cath with Cardiomems;  Surgeon: Larey Dresser, MD;  Location: [place] [facility];  Service: Cardiovascular;  Laterality: N/A;  . SHOULDER ARTHROSCOPY WITH ROTATOR CUFF REPAIR Right   . SHOULDER OPEN ROTATOR CUFF REPAIR Left 01/2011   [name] 01/29/2011  . TOTAL ABDOMINAL HYSTERECTOMY    . TOTAL KNEE ARTHROPLASTY Left   . TOTAL KNEE ARTHROPLASTY Right 11/18/2012   Procedure: TOTAL KNEE ARTHROPLASTY;  Surgeon: Yvette Rack., MD;  Location: [facility] [place];  Service:  Orthopedics;  Laterality: Right;  . TUBAL LIGATION    . UPPER GI ENDOSCOPY     Social History   Occupational History  . Occupation: retired  Tobacco Use  . Smoking status: Never Smoker  . Smokeless tobacco: Never Used  Substance and Sexual Activity  . Alcohol use: No    Alcohol/week: 0.0 oz  . Drug use: No  . Sexual activity: No

## 2017-09-03 NOTE — Progress Notes (Signed)
Advanced Heart Failure Clinic Note   Primary Care: Dr. Edrick Oh Renal: Dr Joelyn Oms HF Cardiology: Dr. Aundra Dubin  HPI: [name] is a 72 y.o. with history of chronic diastolic CHF (Echo 2/37/62 LVEF 65%, Mild LAE), chronic lymphedema, chronic back pain, HTN, CKD stage 3, and fibromyalgia.    Admitted several [facility] in 02/[facility] to [facility] with CHF and chest pain.  Has been tried on spironolactone + metolazone alone as diuretic regimen. Follows with renal as above.   Seen in [facility] clinic 04/16/16. Thought to have mild volume overload. Lasix dose increased to 40 mg daily.  She was admitted in 9/17 with chest pain and AKI.  She had seen nephrology and was given metolazone to take daily along with Lasix, creatinine rose considerably.  Lasix was decreased to 40 mg bid.  When creatinine improved, she had right and left heart cath showing nonobstructive CAD and near-normal filling pressures.  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    Had Cardiomems placed on 11/13/16. PA diastolic 30mmHg at implant time.   PADP 8 on Cardiomems as of 08/31/17. She has been in the process of moving back home from SNF, so was unable to lie on 1/9, and poor quality on 09/02/17. . Goal (6 - 13)  She presents today for regular follow up. Last seen in October, and has since been in SNF after recent falls and shoulder surgery.  We communicated semi-regularly with daughter and facility for torsemide dosing, but it appeared SNF was not received (or not following through) on our orders.  Pt had torsemide 80 mg daily ordered on discharge in 05/2017, but by SNF paperwork today, was getting only 20 daily AS NEEDED, up until recently.  Pt feels much better back on torsemide. Has been taking 80 mg BID for past several days as directed by HF clinic. She has mildly dyspnea with ADLs, but generally does OK at pace on flat ground. She has chronic lymphedema. Upper leg edema has improved back on torsemide.  She denies lightheadedness or dizziness.   Labs (9/17): K 4.6, creatinine 1.55, BNP 63 Labs (10/17): K 4.7, creatinine 1.8 Labs ( 09/06/2016): K 3.7 Creatinine 2.05  Labs (1/18): K 5.1, creatinine 2.03 Labs (2/18): hgb 12, K 4.3, creatinine 1.87 Labs (11/13/2016) K 3.8 Creatinine 2.12  Labs (10/18): K 3.7, creatinine 1.6  Review of systems complete and found to be negative unless listed in HPI.    Past Medical History 1. Chronic diastolic CHF - Echo 04/23/50 LVEF 65%, Mild LAE - RHC (10/17): mean RA 4, PA 25/11, mean PCWP 5, CI 2.4.  - Echo (2/18): EF 55-60%, mild LVH, normal RV size and systolic function. - Cardiomems implanted 3/18.  2. CKD stage 3: Follows with nephology. Baseline appears to be creatinine 1.4-1.6.  3. Chronic lymphedema: Has had wraps.  4. HTN 5. Dementia 6. Fibromyalgia 7. Microvascular angina:  - Normal coronaries on Cath 2009 - Stress Myoview 04/09/16 with no ischemia - LHC (10/17) with mild nonobstructive disease.  8. Restless leg syndrome  FH: Mother with CHF, brother with "heart disease."  Another brother with h/o MI.   Current Outpatient Medications  Medication Sig Dispense Refill  . alendronate (FOSAMAX) 70 MG tablet Take 70 mg by mouth every Friday. Take with a full glass of water on an empty stomach.     Marland Kitchen amLODipine (NORVASC) 10 MG tablet Take 1 tablet (10 mg total) by mouth daily. 30 tablet 6  . aspirin EC 325  MG EC tablet Take 1 tablet (325 mg total) by mouth daily. 30 tablet 0  . baclofen (LIORESAL) 10 MG tablet Take 10 mg by mouth 3 (three) times daily.    . Cyanocobalamin (VITAMIN B-12 IJ) Inject as directed See admin instructions. Weekly for 4 weeks, has 1 more dose this week (week of 7/2) then will switch to once a month    . donepezil (ARICEPT) 10 MG tablet Take 10 mg by mouth at bedtime.     . DULoxetine (CYMBALTA) 30 MG capsule Take 30 mg by mouth at bedtime.     Marland Kitchen escitalopram (LEXAPRO) 10 MG tablet Take 10 mg by mouth daily.     .  fentaNYL (DURAGESIC - DOSED MCG/HR) 25 MCG/HR patch Place 25 mcg onto the skin every 3 (three) days.     . fluticasone (FLONASE) 50 MCG/ACT nasal spray Place 1 spray into the nose daily.     . folic acid (FOLVITE) 1 MG tablet Take 1 mg by mouth 2 (two) times daily.    Marland Kitchen gabapentin (NEURONTIN) 100 MG capsule Take 100 mg by mouth at bedtime.    Marland Kitchen HYDROcodone-acetaminophen (NORCO) 10-325 MG tablet Take 1 tablet by mouth every 4 (four) hours as needed for moderate pain. 60 tablet 0  . lamoTRIgine (LAMICTAL) 100 MG tablet Take 100 mg by mouth daily.     Marland Kitchen levothyroxine (SYNTHROID, LEVOTHROID) 75 MCG tablet Take 75 mcg by mouth daily before breakfast.    . memantine (NAMENDA) 10 MG tablet Take 10 mg by mouth 2 (two) times daily.    . pravastatin (PRAVACHOL) 80 MG tablet Take 80 mg by mouth at bedtime.     . ranitidine (ZANTAC) 150 MG tablet Take 150 mg by mouth 2 (two) times daily.    Marland Kitchen rOPINIRole (REQUIP) 1 MG tablet Take 1 mg by mouth 4 (four) times daily.     . sennosides-docusate sodium (SENOKOT-S) 8.6-50 MG tablet Take 2 tablets by mouth at bedtime.    . torsemide (DEMADEX) 20 MG tablet Take 4 tablets (80 mg total) by mouth 2 (two) times daily.    . traZODone (DESYREL) 50 MG tablet Take 50 mg by mouth at bedtime.     . Turmeric 500 MG CAPS Take 500 mg by mouth daily.      No current facility-administered medications for this encounter.    No Known Allergies  Social History   Socioeconomic History  . Marital status: Widowed    Spouse name: Not on file  . Number of children: 3  . Years of education: Not on file  . Highest education level: Not on file  Social Needs  . Financial resource strain: Not on file  . Food insecurity - worry: Not on file  . Food insecurity - inability: Not on file  . Transportation needs - medical: Not on file  . Transportation needs - non-medical: Not on file  Occupational History  . Occupation: retired  Tobacco Use  . Smoking status: Never Smoker  .  Smokeless tobacco: Never Used  Substance and Sexual Activity  . Alcohol use: No    Alcohol/week: 0.0 oz  . Drug use: No  . Sexual activity: No  Other Topics Concern  . Not on file  Social History Narrative   Pt does use caffeine. Lives alone. 3 children. Retired.   Vitals:   09/03/17 1100  BP: 138/78  Pulse: 90  SpO2: 94%  Weight: 266 lb 3.2 oz (120.7 kg)   Wt Readings from  Last 3 Encounters:  09/03/17 266 lb 3.2 oz (120.7 kg)  06/15/17 266 lb (120.7 kg)  06/09/17 263 lb (119.3 kg)   PHYSICAL EXAM: General:NAD.  HEENT: Normal Neck: Supple. JVP difficult, but appears to be 7-8 cm. Carotids 2+ bilat; no bruits. No thyromegaly or nodule noted. Cor: PMI nondisplaced. RRR, 1/6 early SEM RUSB.  Lungs: CTAB, normal effort. Abdomen: Soft, non-tender, non-distended, no HSM. No bruits or masses. +BS  Extremities: No cyanosis, clubbing, or rash. BLE chronic lymphedema.  Neuro: Alert & orientedx3, cranial nerves grossly intact. moves all 4 extremities w/o difficulty. Affect pleasant   ASSESSMENT & PLAN: 1. Chronic diastolic CHF:  Echo (7/26) with EF 55-60%, normal RV.  Has Cardiomems. PADP actually low at 3 mmHg (goal has been 6-13 mmHg).   - NYHA III symptoms at baseline.  - Volume status relatively stable on exam and cardiomems number trending down.  - She can take torsemide 80 mg BID today, but then should go back to 80 mg q am and 40 mg q pm.  - She is not currently using metolazone.  - She is off Plavix post-Cardiomems.  2. Lymphedema:  - Relatively stable.  3. CKD: Stage 3 - BMET today.  4. Chest pain: LHC 2017 Nonobstructive.  - No s/s of ischemia.Seems to correlate with her volume. None recently.  5. HTN:  - Mildly elevated in clinic today. Will leave meds as ordered with ongoing diuresis.  6. Left shoulder pain:  - Dr. Marlou Sa following - Gently discussed with patient today that we will not manage her pain, as daughter asked if we could make adjustments to her regimen.    7. DM2: - Per PCP  Volume status appears near her baseline on exam. Should go back to her standard chronic dose of 80/40 of torsemide tomorrow.  BMET today.  We will continue to follow fluid status via cardiomems. RTC 6-8 weeks. Sooner with symptoms.   Shirley Friar, PA-C  09/03/2017   Greater than 50% of the 30 minute visit was spent in counseling/coordination of care regarding disease state education, salt/fluid restriction, sliding scale diuretics, and medication compliance.

## 2017-09-06 ENCOUNTER — Ambulatory Visit: Payer: Medicare Other | Admitting: Physical Therapy

## 2017-09-06 ENCOUNTER — Telehealth (INDEPENDENT_AMBULATORY_CARE_PROVIDER_SITE_OTHER): Payer: Self-pay | Admitting: Orthopedic Surgery

## 2017-09-06 NOTE — Telephone Encounter (Signed)
IC no answer. LM and verbal given

## 2017-09-06 NOTE — Telephone Encounter (Signed)
Caryl Pina with [facility] needs PT verbal orders for this patient.   2x for 6 weeks OT Evaluation Education officer, museum Consult  CB [phone]

## 2017-09-09 ENCOUNTER — Ambulatory Visit (INDEPENDENT_AMBULATORY_CARE_PROVIDER_SITE_OTHER): Payer: Medicare Other | Admitting: Orthopedic Surgery

## 2017-09-14 ENCOUNTER — Telehealth (INDEPENDENT_AMBULATORY_CARE_PROVIDER_SITE_OTHER): Payer: Self-pay | Admitting: Orthopedic Surgery

## 2017-09-14 NOTE — Telephone Encounter (Signed)
Caryl Pina, nurse at [facility] states that there was a referral put in for therapy, and that physical therapist has obtained an order for OT and the OT went out on medical leave. Is it okay that the physical therapist addresses the patients shoulder instead of the occupational therapist? Please advise # [phone]

## 2017-09-14 NOTE — Telephone Encounter (Signed)
Please advise. Thanks.  

## 2017-09-14 NOTE — Telephone Encounter (Signed)
yy

## 2017-09-15 NOTE — Telephone Encounter (Signed)
IC verbal given.  

## 2017-09-20 ENCOUNTER — Ambulatory Visit (HOSPITAL_COMMUNITY)
Admission: RE | Admit: 2017-09-20 | Discharge: 2017-09-20 | Disposition: A | Discharge status: Home / Self Care | Payer: Medicare Other | Source: Ambulatory Visit | Attending: Internal Medicine | Admitting: Internal Medicine

## 2017-09-20 ENCOUNTER — Telehealth (HOSPITAL_COMMUNITY): Payer: Self-pay

## 2017-09-20 DIAGNOSIS — Z79899 Other long term (current) drug therapy: Secondary | ICD-10-CM | POA: Insufficient documentation

## 2017-09-20 DIAGNOSIS — I5032 Chronic diastolic (congestive) heart failure: Secondary | ICD-10-CM

## 2017-09-20 DIAGNOSIS — Z95811 Presence of heart assist device: Secondary | ICD-10-CM | POA: Diagnosis present

## 2017-09-20 NOTE — Telephone Encounter (Signed)
Advanced Heart Failure Triage Encounter  Patient Name: [name]  Date of Call: 09/20/17  Problem:  Pt daughter called because pt has been gaining weight "daily". Unable to give exact weight. Pt weighs 272 at last appointment was 266 lbs. Pt has cardiomems and has had wt gain.  Plan:  Per Amy NP pt is to increase Torsemide 80 mg BID for 2 days then go back to 80/40 mg. Then Bmet in 10 days. Pt daughter aware and agreeable. Lab appointment scheduled.   Shirley Muscat, RN

## 2017-09-20 NOTE — Progress Notes (Signed)
   Cardiomems Update  RHC RA 4 PA 23/10 (15)  PCWP 5 PA 76% CO 9.1 CI 4.1     Implant Labs Creatinine 2.12 BUN 58 Potassium 3.9   PAD Range 6-13 Current PAD 13    meds - continue torsemide 80 mg /40 mg   Current Labs  09/03/2017 K 4 Creatinine 1.46  06/15/2017 K 3.8 Creatinine 1.46   I have reviewed current PAD from cardiomems device and weekly. PAD reading elevated.   PAD elevated. Increase torsemide to 80 mg twice a day x 2 days then back to 80 mg/40mg .      Amy Clegg NP-C  3:58 PM

## 2017-09-27 ENCOUNTER — Telehealth (INDEPENDENT_AMBULATORY_CARE_PROVIDER_SITE_OTHER): Payer: Self-pay | Admitting: Orthopedic Surgery

## 2017-09-27 NOTE — Telephone Encounter (Signed)
I tried calling. No answer. LMVM for her advising was calling her back to discuss.

## 2017-09-27 NOTE — Telephone Encounter (Signed)
Jarrett Soho  [facility]  [phone]  Please call Jarrett Soho at [facility] to discuss pt care

## 2017-09-28 ENCOUNTER — Telehealth (INDEPENDENT_AMBULATORY_CARE_PROVIDER_SITE_OTHER): Payer: Self-pay

## 2017-09-28 NOTE — Telephone Encounter (Signed)
Pls call her and eval thx

## 2017-09-28 NOTE — Telephone Encounter (Signed)
Jarrett Soho therapist assistant with [facility] called wanted to make sure that you were aware that on 09/23/17 patient fell and landed on her right shoulder while trying to reach for a phone that was on a table with wheels, the table moved and patient fell.  She states that patient refused going to ER to get this looked at. She said also that patient reports increasing soreness all over. She is having right thigh pain with active motion but not with weightbearing.  The therapist has advised patient to sleep in her lift chair b/c she feels patient cannot safely get in and out of the hospital bed that she has.  Therapist states that she has an order in for social worker to try and get patient back into skilled nursing b/c they feel that patient cannot safely care for herself at home. She does have some family that can be there with her during the day but unable to assist her physically.  The therapist is wanting to know what you would suggest. I have made patient an appt for the soonest available that you have.  Contact for Jarrett Soho with [facility] [phone]

## 2017-09-28 NOTE — Telephone Encounter (Signed)
Do you want to keep her scheduled for next week or I can work her in tomorrow?

## 2017-09-28 NOTE — Telephone Encounter (Signed)
There was a call from [facility] transferred to my voicemail. It was P.T. Hannah with [facility]. She asked for me to call her back to discuss patients care. I did call her back, no answer. LMVM for her advising was calling her back. Left on VM for her to have whomever answers the phone when she calls to come and find me as I may not be sitting at my desk. She has been trying to reach me since yesterday with concerns that she has regarding patients care. I need to speak with her. See other notes in chart from yesterday where she had called.

## 2017-09-28 NOTE — Telephone Encounter (Signed)
Needs to come in for eval of shoulder the rest is complicated

## 2017-09-29 ENCOUNTER — Other Ambulatory Visit (HOSPITAL_COMMUNITY): Payer: Medicare Other

## 2017-09-29 ENCOUNTER — Other Ambulatory Visit (HOSPITAL_COMMUNITY): Payer: Self-pay | Admitting: *Deleted

## 2017-09-29 DIAGNOSIS — I5022 Chronic systolic (congestive) heart failure: Secondary | ICD-10-CM

## 2017-09-29 NOTE — Telephone Encounter (Signed)
IC stated that she had an appt for 02/11

## 2017-10-04 ENCOUNTER — Ambulatory Visit (INDEPENDENT_AMBULATORY_CARE_PROVIDER_SITE_OTHER): Payer: No Typology Code available for payment source

## 2017-10-04 ENCOUNTER — Encounter (INDEPENDENT_AMBULATORY_CARE_PROVIDER_SITE_OTHER): Payer: Self-pay | Admitting: Orthopedic Surgery

## 2017-10-04 ENCOUNTER — Ambulatory Visit (INDEPENDENT_AMBULATORY_CARE_PROVIDER_SITE_OTHER): Payer: Medicare Other | Admitting: Orthopedic Surgery

## 2017-10-04 DIAGNOSIS — M5441 Lumbago with sciatica, right side: Secondary | ICD-10-CM | POA: Diagnosis not present

## 2017-10-04 DIAGNOSIS — M25511 Pain in right shoulder: Secondary | ICD-10-CM

## 2017-10-04 NOTE — Progress Notes (Signed)
Office Visit Note   Patient: [name]           Date of Birth: 1946-01-23           MRN: [id number] Visit Date: 10/04/2017 Requested by: Dione Housekeeper, MD [address] [place], [place] [address] PCP: Dione Housekeeper, MD  Subjective: Chief Complaint  Patient presents with  . Right Shoulder - Pain  . Lower Back - Pain    HPI: [name] is a 72 year old patient with multiple orthopedic complaints today.  She reports right shoulder pain and right leg weakness.  She has had 2 falls since she has been at home.  Left shoulder doing recently well following reverse shoulder replacement.  She is now at [facility] rehab.  She has had back surgery in the past.  Last fall was 09/30/2017.              ROS: All systems reviewed are negative as they relate to the chief complaint within the history of present illness.  Patient denies  fevers or chills.   Assessment & Plan: Visit Diagnoses:  1. Acute back pain with sciatica, right   2. Acute pain of right shoulder     Plan: Impression is 2 falls since she has been home from rehab.  She has some hip flexor weakness which I think is related to her back.  Radiographs of the femur today are normal.  Plan EMG nerve study to confirm this is nerve root related and then we can send her to Dr. Carloyn Manner for further evaluation and management options for this leg weakness.  In regards to the right shoulder I like to see how she does with some rehab for that to see what function she can recover.  I would not jump into reverse shoulder replacement on her at this time.  Follow-up in 4-5 months after a course of rehab.  Could also see her back after that nerve study. Follow-Up Instructions: No Follow-up on file.   Orders:  Orders Placed This Encounter  Procedures  . XR Lumbar Spine 2-3 Views  . XR Shoulder Right  . XR Pelvis 1-2 Views  . XR FEMUR, MIN 2 VIEWS RIGHT  . Ambulatory referral to Physical Medicine Rehab   No orders of the defined types  were placed in this encounter.     Procedures: No procedures performed   Clinical Data: No additional findings.  Objective: Vital Signs: There were no vitals taken for this visit.  Physical Exam:  Constitutional: Patient appears well-developed HEENT:  Head: Normocephalic Eyes:EOM are normal Neck: Normal range of motion Cardiovascular: Normal rate Pulmonary/chest: Effort normal Neurologic: Patient is alert Skin: Skin is warm Psychiatric: Patient has normal mood and affect    Ortho Exam: Orthopedic exam demonstrates hip flexor weakness on the right compared to the left.  Ankle dorsiflexion plantar flexion strength is intact.  Lymphedema present bilateral lower extremities.  It feels like her extensor mechanism is intact on the right-hand side because she can do leg extension against resistance.  No groin pain with internal/external rotation of either leg.  Right shoulder demonstrates fairly significant weakness with forward flexion and abduction consistent with diagnosis of rotator cuff arthropathy.  Left shoulder is actually doing a little bit better with that in terms of forward flexion and abduction to around 70.  Specialty Comments:  No specialty comments available.  Imaging: Xr Femur, Min 2 Views Right  Result Date: 10/04/2017 AP right femur reviewed.  No fracture present.  No hip arthritis present.  Total knee replacement in good position and alignment in the right knee.  Xr Lumbar Spine 2-3 Views  Result Date: 10/04/2017 AP lateral lumbar spine reviewed.  Fusion between L2 and L3 has been performed.  No evidence of hardware complication.  Cage appears close to being flush with the posterior aspect of the vertebral body.  There is arthritis at L5-S1.  No spondylolisthesis or compression fracture.  Xr Pelvis 1-2 Views  Result Date: 10/04/2017 AP pelvis reviewed.  No fracture or dislocation is seen.  No hip arthritis present.  Significant lower lumbar degenerative  changes noted  Xr Shoulder Right  Result Date: 10/04/2017 AP outlet and axillary right shoulder reviewed.  Rotator cuff arthropathy is present with absence of acromiohumeral interval.  Prior distal clavicle excision has been performed.  There is no fracture or dislocation.    PMFS History: Patient Active Problem List   Diagnosis Date Noted  . Shoulder arthritis 06/15/2017  . Status post revision of total knee replacement, left 04/29/2017  . Polyethylene liner wear following left total knee arthroplasty requiring isolated polyethylene liner exchange ([place]) 03/02/2017  . 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 Unstable angina ([place]) 05/21/2016  . Chronic diastolic heart failure ([place]) 03/29/2016  . Normal coronary arteries 2009 01/14/2015  . Obesity-BMI 40 01/14/2015  . Restless leg 01/14/2015  . Chest pain 01/14/2015  . Back pain 01/14/2015  . Renal insufficiency 01/14/2015  . Dementia- mild memory issues 01/14/2015  . Occult blood positive stool 12/14/2014  . Anemia 12/14/2014  . Family history of colon cancer 12/14/2014  . Change in bowel habits 12/14/2014  . GERD (gastroesophageal reflux disease) 12/14/2014  . Dyspnea 05/17/2012  . Lymphedema 05/17/2012  . Weight gain 05/17/2012  . HTN (hypertension) 05/17/2012   Past Medical History:  Diagnosis Date  . Anemia   . Anxiety   . Arthritis    "hands, feet" (03/03/2017)  . Brain lesion    2 types  . Cervical spondylosis with myelopathy   . Chest pain    Normal cardiac cath 5/09  . Chronic lower back pain   . CKD (chronic kidney disease), stage III ([place])   . Congestive heart failure (CHF) ([place])    has a Cardiomems implant   . Constipation   . Dementia   . Depression   . Dumping syndrome   . Dyspnea    with activity  . Edema   . Fatigue   . Fibromyalgia   . GERD (gastroesophageal reflux disease)   . Headache    "w/high CBG" (03/03/2017)  . History of echocardiogram    a. Echo 03/20/16  (done at [facility] in [place], [place]):  mild LVH, EF 41%, normal diastolic function, mild LAE, MAC, RVSP 25 mmHg  . Hyperlipidemia   . Hypertension   . Hypothyroidism   . Lumbar spondylosis   . Lymphedema    seeing specialist for this  . Migraine    "mostly stopped when I changed my diet" (03/03/2017)  . OSA on CPAP   . Pneumonia X 1  . PONV (postoperative nausea and vomiting)   . Restless leg syndrome   . Rheumatoid arthritis ([place])   . Stroke [facility])    TIAs "mini strokes"- unsure of last TIA - pt and daughter deny this  . Syncope   . Tremors of nervous system   . Type II diabetes mellitus (HCC)     Family History  Problem Relation Age of  Onset  . Heart disease Mother   . Heart failure Mother   . Kidney disease Mother   . Lung cancer Father   . Colon cancer Brother   . Cancer Brother        malignant thymoma  . Heart disease Brother   . Tremor Maternal Grandmother 90  . Ovarian cancer Maternal Grandmother   . Uterine cancer Maternal Grandmother   . Irritable bowel syndrome Daughter   . Esophageal cancer Neg Hx   . Stomach cancer Neg Hx   . Rectal cancer Neg Hx     Past Surgical History:  Procedure Laterality Date  . APPENDECTOMY  1966  . BACK SURGERY    . CARDIAC CATHETERIZATION N/A 05/25/2016   Procedure: Right/Left Heart Cath and Coronary Angiography;  Surgeon: Larey Dresser, MD;  Location: [facility];  Service: Cardiovascular;  Laterality: N/A;  . CATARACT EXTRACTION W/ INTRAOCULAR LENS  IMPLANT, BILATERAL Bilateral   . COLONOSCOPY    . CORONARY ANGIOGRAM  2009   Normal coronaries  . EXCISION/RELEASE BURSA HIP Bilateral   . I&D KNEE WITH POLY EXCHANGE Left 03/02/2017   Procedure: Left knee Revision of poly liner;  Surgeon: Mcarthur Rossetti, MD;  Location: [facility];  Service: Orthopedics;  Laterality: Left;  . JOINT REPLACEMENT    . KNEE ARTHROSCOPY Bilateral   . LAPAROSCOPIC CHOLECYSTECTOMY  2015  . LUMBAR DISC SURGERY    . LUMBAR  LAMINECTOMY/DECOMPRESSION MICRODISCECTOMY Left 12/2005   L2-3 laminectomy and diskectomy/notes 01/06/2011  . POSTERIOR LUMBAR FUSION  04/2006   [name] 01/06/2011; "put cages in"  . REVERSE SHOULDER ARTHROPLASTY Left 06/15/2017   Procedure: REVERSE LEFT SHOULDER ARTHROPLASTY;  Surgeon: Meredith Pel, MD;  Location: [facility];  Service: Orthopedics;  Laterality: Left;  . REVISION TOTAL KNEE ARTHROPLASTY Left 03/02/2017   poly liner/notes 03/02/2017  . RIGHT HEART CATH N/A 11/13/2016   Procedure: Right Heart Cath with Cardiomems;  Surgeon: Larey Dresser, MD;  Location: [facility];  Service: Cardiovascular;  Laterality: N/A;  . SHOULDER ARTHROSCOPY WITH ROTATOR CUFF REPAIR Right   . SHOULDER OPEN ROTATOR CUFF REPAIR Left 01/2011   [name] 01/29/2011  . TOTAL ABDOMINAL HYSTERECTOMY    . TOTAL KNEE ARTHROPLASTY Left   . TOTAL KNEE ARTHROPLASTY Right 11/18/2012   Procedure: TOTAL KNEE ARTHROPLASTY;  Surgeon: Yvette Rack., MD;  Location: [facility];  Service: Orthopedics;  Laterality: Right;  . TUBAL LIGATION    . UPPER GI ENDOSCOPY     Social History   Occupational History  . Occupation: retired  Tobacco Use  . Smoking status: Never Smoker  . Smokeless tobacco: Never Used  Substance and Sexual Activity  . Alcohol use: No    Alcohol/week: 0.0 oz  . Drug use: No  . Sexual activity: No

## 2017-10-05 NOTE — Addendum Note (Signed)
Addended by: Meyer Cory on: 10/05/2017 08:54 AM   Modules accepted: Orders

## 2017-10-15 ENCOUNTER — Encounter (HOSPITAL_COMMUNITY): Payer: Medicare Other | Admitting: Cardiology

## 2017-10-27 ENCOUNTER — Telehealth (INDEPENDENT_AMBULATORY_CARE_PROVIDER_SITE_OTHER): Payer: Self-pay | Admitting: Orthopedic Surgery

## 2017-10-27 ENCOUNTER — Encounter: Payer: Self-pay | Admitting: Neurology

## 2017-10-27 ENCOUNTER — Ambulatory Visit (INDEPENDENT_AMBULATORY_CARE_PROVIDER_SITE_OTHER): Payer: Medicare Other | Admitting: Orthopedic Surgery

## 2017-10-27 NOTE — Telephone Encounter (Signed)
Patient's daughter Vicente Males called asked if patient still need to keep her appointment today. Patient saw Dr Marlou Sa 3 weeks ago. Vicente Males advised patient have not had the NCS yet. The number to contact Vicente Males is [phone]

## 2017-10-27 NOTE — Telephone Encounter (Signed)
appt for this afternoon cancelled. IC s/w daughter because she stated no one had called her about referral for EMG/NCV.  I looked in patients chart and advised her that [facility] had tried to reach her but had not been successful.  I gave her contact information to call and schedule an appt for study.

## 2017-10-27 NOTE — Telephone Encounter (Signed)
Please advise. Thanks.  

## 2017-10-27 NOTE — Telephone Encounter (Signed)
Wait until ncs done pls call htx

## 2017-10-28 ENCOUNTER — Telehealth (HOSPITAL_COMMUNITY): Payer: Self-pay

## 2017-10-28 NOTE — Telephone Encounter (Signed)
Cardiomems device elevated. Advised per Amy Clegg NP-C to increase torsemide to 80 mg this afternoon, and 80 mg BID tomorrow. Then resume to normal daily dose of 80 mg in am and 40 mg in pm on Saturday. VO given to patient's nurse at [facility]). Also faxed order to provided # [phone] attn: Jerilee Field, Guinevere Ferrari, RN

## 2017-10-29 ENCOUNTER — Telehealth (HOSPITAL_COMMUNITY): Payer: Self-pay | Admitting: *Deleted

## 2017-10-29 ENCOUNTER — Other Ambulatory Visit: Payer: Self-pay | Admitting: *Deleted

## 2017-10-29 DIAGNOSIS — M549 Dorsalgia, unspecified: Secondary | ICD-10-CM

## 2017-10-29 NOTE — Telephone Encounter (Signed)
Vicente Males, pt's daughter, left a VM on triage line this afternoon stating she doesn't think pt is getting correct dose of Torsemide.  She states they told her pt got 50 mg this AM and will get 40 mg at night.  And she request we f/u w/[facility].  I have called Nan[facility] Means [phone]) multiple times this after noon and have not been able to speak w/nurse or nursing supervisor regarding meds.  I called pt's daughter back to let her know I could not reach anyone and I will try again on Monday, did reassure her that our office spoke w/nurse yesterday afternoon and gave orders and then faxed orders to them so there should not be an issue but we will f/u on Monday, she is thankful for our assistance

## 2017-11-03 NOTE — Telephone Encounter (Signed)
Cardiomems 3/7 was 15, Mon 3/11 it was down to 10 which is in pt's normal range so appears pt was given correct dose of medication, will continue to monitor.

## 2017-11-08 ENCOUNTER — Ambulatory Visit (HOSPITAL_COMMUNITY)
Admission: RE | Admit: 2017-11-08 | Discharge: 2017-11-08 | Disposition: A | Discharge status: Home / Self Care | Payer: Medicare Other | Source: Ambulatory Visit | Attending: Cardiology | Admitting: Cardiology

## 2017-11-08 DIAGNOSIS — I5030 Unspecified diastolic (congestive) heart failure: Secondary | ICD-10-CM | POA: Insufficient documentation

## 2017-11-08 DIAGNOSIS — I5032 Chronic diastolic (congestive) heart failure: Secondary | ICD-10-CM | POA: Diagnosis not present

## 2017-11-08 NOTE — Addendum Note (Signed)
Encounter addended by: Scarlette Calico, RN on: 11/08/2017 4:14 PM  Actions taken: Charge Capture section accepted

## 2017-11-08 NOTE — Progress Notes (Signed)
   Cardiomems Update  RHC RA 4 PA 23/10 (15)  PCWP 5 PA 76% CO 9.1 CI 4.1     Implant Labs Creatinine 2.12 BUN 58 Potassium 3.9   PAD Range 6-13 Current PAD 9   meds - continue torsemide 80 mg /40 mg   Current Labs  09/03/2017 K 4 Creatinine 1.46  06/15/2017 K 3.8 Creatinine 1.46   I have reviewed current PAD from cardiomems device. PAD reading stable. Continue current diuretic regimen.      Sanad Fearnow NP-C  1:45 PM

## 2017-11-08 NOTE — Progress Notes (Signed)
Pt has f/u with Dr Aundra Dubin on 11/25/17, per Darrick Grinder, NP ok to get bmet at that time.

## 2017-11-09 ENCOUNTER — Encounter: Payer: Medicare Other | Admitting: Neurology

## 2017-11-17 ENCOUNTER — Telehealth (HOSPITAL_COMMUNITY): Payer: Self-pay | Admitting: *Deleted

## 2017-11-17 NOTE — Telephone Encounter (Signed)
Pts daughter called concerned about pts Torsemide dose she said [name] has not been giving her mother the correct dose. She said they are giving only 40mg  of Torsemide daily. She said her moms weight is up and shes coughing up clear phlegm. I called [name] to verify torsemide dose Angel,RN told me her Torsemide dose is 80mg  twice daily and that patients weight is only up 3lbs from initial admission. Pts weight is 270lbs. Patient is currently being treated for pneumonia.  Heather Schub,RN printed out patients cardio mems report and patient is within her normal range. I called [name] (daughter) back and told her I verified torsemide dose,weight,and symptoms with the nurse at [name]. Daughter disagreed with what the RN told me. She insists patient is not taking an evening dose of Torsemide and that her weight is up "a lot". I told her I could only go by what was told to me directly from her mothers nurse and that I would follow up with a nursing supervisor just to make sure patient is receiving her correct dose of medications and being cared for properly. [name] thanked me and we ended the call. Discussed all of this with [name] Grinder, NP and per Amy Clegg no changes at this time.

## 2017-11-25 ENCOUNTER — Encounter (HOSPITAL_COMMUNITY): Payer: Medicare Other | Admitting: Cardiology

## 2017-11-26 ENCOUNTER — Telehealth (INDEPENDENT_AMBULATORY_CARE_PROVIDER_SITE_OTHER): Payer: Self-pay | Admitting: Orthopedic Surgery

## 2017-11-26 NOTE — Telephone Encounter (Signed)
Please advise. Thanks.  

## 2017-11-26 NOTE — Telephone Encounter (Signed)
Patient's mother left a message stating that the PT needs to know how much pressure they are to put on her arm.  [phone]

## 2017-11-29 ENCOUNTER — Ambulatory Visit (HOSPITAL_COMMUNITY)
Admission: RE | Admit: 2017-11-29 | Discharge: 2017-11-29 | Disposition: A | Discharge status: Home / Self Care | Payer: Medicare Other | Source: Ambulatory Visit | Attending: Cardiology | Admitting: Cardiology

## 2017-11-29 VITALS — BP 139/52 | HR 88 | Wt 271.5 lb

## 2017-11-29 DIAGNOSIS — Z79899 Other long term (current) drug therapy: Secondary | ICD-10-CM | POA: Insufficient documentation

## 2017-11-29 DIAGNOSIS — Z7982 Long term (current) use of aspirin: Secondary | ICD-10-CM | POA: Insufficient documentation

## 2017-11-29 DIAGNOSIS — Z8249 Family history of ischemic heart disease and other diseases of the circulatory system: Secondary | ICD-10-CM | POA: Diagnosis not present

## 2017-11-29 DIAGNOSIS — M797 Fibromyalgia: Secondary | ICD-10-CM | POA: Insufficient documentation

## 2017-11-29 DIAGNOSIS — G8929 Other chronic pain: Secondary | ICD-10-CM | POA: Diagnosis not present

## 2017-11-29 DIAGNOSIS — I890 Lymphedema, not elsewhere classified: Secondary | ICD-10-CM | POA: Diagnosis not present

## 2017-11-29 DIAGNOSIS — E1122 Type 2 diabetes mellitus with diabetic chronic kidney disease: Secondary | ICD-10-CM | POA: Insufficient documentation

## 2017-11-29 DIAGNOSIS — F0390 Unspecified dementia without behavioral disturbance: Secondary | ICD-10-CM | POA: Diagnosis not present

## 2017-11-29 DIAGNOSIS — I5032 Chronic diastolic (congestive) heart failure: Secondary | ICD-10-CM

## 2017-11-29 DIAGNOSIS — Z7983 Long term (current) use of bisphosphonates: Secondary | ICD-10-CM | POA: Insufficient documentation

## 2017-11-29 DIAGNOSIS — Z79890 Hormone replacement therapy (postmenopausal): Secondary | ICD-10-CM | POA: Insufficient documentation

## 2017-11-29 DIAGNOSIS — G2581 Restless legs syndrome: Secondary | ICD-10-CM | POA: Diagnosis not present

## 2017-11-29 DIAGNOSIS — Z96619 Presence of unspecified artificial shoulder joint: Secondary | ICD-10-CM | POA: Insufficient documentation

## 2017-11-29 DIAGNOSIS — Z7984 Long term (current) use of oral hypoglycemic drugs: Secondary | ICD-10-CM | POA: Diagnosis not present

## 2017-11-29 DIAGNOSIS — N183 Chronic kidney disease, stage 3 unspecified: Secondary | ICD-10-CM

## 2017-11-29 DIAGNOSIS — I130 Hypertensive heart and chronic kidney disease with heart failure and stage 1 through stage 4 chronic kidney disease, or unspecified chronic kidney disease: Secondary | ICD-10-CM | POA: Insufficient documentation

## 2017-11-29 DIAGNOSIS — Z7951 Long term (current) use of inhaled steroids: Secondary | ICD-10-CM | POA: Insufficient documentation

## 2017-11-29 LAB — BASIC METABOLIC PANEL
ANION GAP: 14 (ref 5–15)
BUN: 20 mg/dL (ref 6–20)
CALCIUM: 10 mg/dL (ref 8.9–10.3)
CO2: 26 mmol/L (ref 22–32)
CREATININE: 1.25 mg/dL — AB (ref 0.44–1.00)
Chloride: 98 mmol/L — ABNORMAL LOW (ref 101–111)
GFR, EST AFRICAN AMERICAN: 49 mL/min — AB (ref >60)
GFR, EST NON AFRICAN AMERICAN: 42 mL/min — AB (ref >60)
Glucose, Bld: 69 mg/dL (ref 65–99)
Potassium: 4 mmol/L (ref 3.5–5.1)
SODIUM: 138 mmol/L (ref 135–145)

## 2017-11-29 MED ORDER — ASPIRIN EC 81 MG PO TBEC: 81.0000 mg | DELAYED_RELEASE_TABLET | Freq: Every day | ORAL | 3 refills | Status: DC

## 2017-11-29 MED ORDER — TORSEMIDE 20 MG PO TABS: 80.0000 mg | ORAL_TABLET | Freq: Every day | ORAL | 3 refills | Status: DC

## 2017-11-29 NOTE — Patient Instructions (Signed)
Labs today (will call for abnormal results, otherwise no news is good news)  START taking Aspirin 81 mg Once Daily  INCREASE Torsemide to 80 mg (4 Tabs) Once Daily  Labs in 10 days (bmet)  Follow up in 3 Months.  Please call to schedule appointment closer to time. [phone], Option 3.

## 2017-11-30 NOTE — Progress Notes (Signed)
Advanced Heart Failure Clinic Note   Primary Care: Dr. Edrick Oh Renal: Dr Joelyn Oms HF Cardiology: Dr. Aundra Dubin  HPI: [name] is a 72 y.o. with history of chronic diastolic CHF (Echo 08/25/56 LVEF 65%, Mild LAE), chronic lymphedema, chronic back pain, HTN, CKD stage 3, and fibromyalgia.    Admitted several times in 02/2016 to [facility] with CHF and chest pain.  Has been tried on spironolactone + metolazone alone as diuretic regimen. Follows with renal as above.   She was admitted in 9/17 with chest pain and AKI.  She had seen nephrology and was given metolazone to take daily along with Lasix, creatinine rose considerably.  Lasix was decreased to 40 mg bid.  When creatinine improved, she had right and left heart cath showing nonobstructive CAD and near-normal filling pressures.  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    Had Cardiomems placed on 11/13/16. PA diastolic 9 mmHg at implant time.   She presents today for followup of diastolic CHF.  She remains in SNF.  She is only taking torsemide 40 mg daily, have had trouble getting her SNF to provide the dose of torsemide that we have been recommending (thought she was getting torsemide 80 qam/40 qpm but she is not).  Her main complaint today is tremors and jerking of arms and legs.  She has seen neurology and has been told that this is restless leg syndrome.  She is getting Requip.  She is short of breath when walking in the halls with her walker.  Weight is up about 5 lbs since last appointment.  More swelling in legs though PADP was only 9 mmHg today by Cardiomems.  No chest pain.  Not very active.   Labs (9/17): K 4.6, creatinine 1.55, BNP 63 Labs (10/17): K 4.7, creatinine 1.8 Labs ( 09/06/2016): K 3.7 Creatinine 2.05  Labs (1/18): K 5.1, creatinine 2.03 Labs (2/18): hgb 12, K 4.3, creatinine 1.87 Labs (11/13/2016) K 3.8 Creatinine 2.12  Labs (10/18): K 3.7, creatinine 1.6 Labs (1/19): K 4,  creatinine 1.46  Review of systems complete and found to be negative unless listed in HPI.    Past Medical History 1. Chronic diastolic CHF - Echo 01/17/77 LVEF 65%, Mild LAE - RHC (10/17): mean RA 4, PA 25/11, mean PCWP 5, CI 2.4.  - Echo (2/18): EF 55-60%, mild LVH, normal RV size and systolic function. - Cardiomems implanted 3/18.  2. CKD stage 3: Follows with nephology. Baseline appears to be creatinine 1.4-1.6.  3. Chronic lymphedema: Has had wraps.  4. HTN 5. Dementia 6. Fibromyalgia 7. Microvascular angina:  - Normal coronaries on Cath 2009 - Stress Myoview 04/09/16 with no ischemia - LHC (10/17) with mild nonobstructive disease.  8. Restless leg syndrome 9. H/o shoulder replacement.   FH: Mother with CHF, brother with "heart disease."  Another brother with h/o MI.   Current Outpatient Medications  Medication Sig Dispense Refill  . alendronate (FOSAMAX) 70 MG tablet Take 70 mg by mouth every Friday. Take with a full glass of water on an empty stomach.     Marland Kitchen amLODipine (NORVASC) 10 MG tablet Take 1 tablet (10 mg total) by mouth daily. 30 tablet 6  . baclofen (LIORESAL) 10 MG tablet Take 10 mg by mouth 3 (three) times daily.    . Cholecalciferol (VITAMIN D3) 1000 units CAPS Take 1 capsule by mouth daily.    . cloNIDine (CATAPRES) 0.1 MG tablet Take 0.1 mg by mouth 2 (two) times  daily.    . Cyanocobalamin (VITAMIN B-12 IJ) Inject as directed See admin instructions. Weekly for 4 weeks, has 1 more dose this week (week of 7/2) then will switch to once a month    . donepezil (ARICEPT) 10 MG tablet Take 10 mg by mouth at bedtime.     . DULoxetine (CYMBALTA) 20 MG capsule Take 20 mg by mouth at bedtime.    Marland Kitchen escitalopram (LEXAPRO) 10 MG tablet Take 10 mg by mouth daily.     . fluticasone (FLONASE) 50 MCG/ACT nasal spray Place 1 spray into the nose daily.     Marland Kitchen gabapentin (NEURONTIN) 300 MG capsule Take 300 mg by mouth 2 (two) times daily.    Marland Kitchen glipiZIDE (GLUCOTROL) 10 MG tablet Take  10 mg by mouth daily before breakfast.    . HYDROcodone-acetaminophen (NORCO) 10-325 MG tablet Take 1 tablet by mouth every 4 (four) hours as needed for moderate pain. 60 tablet 0  . lamoTRIgine (LAMICTAL) 100 MG tablet Take 100 mg by mouth daily.     Marland Kitchen levothyroxine (SYNTHROID, LEVOTHROID) 88 MCG tablet Take 88 mcg by mouth daily before breakfast.    . Melatonin 3 MG CAPS Take 3 mg by mouth at bedtime.    Marland Kitchen omeprazole (PRILOSEC) 20 MG capsule Take 20 mg by mouth 2 (two) times daily before a meal.    . pravastatin (PRAVACHOL) 40 MG tablet Take 40 mg by mouth daily.    . ranitidine (ZANTAC) 150 MG tablet Take 150 mg by mouth daily.    Marland Kitchen rOPINIRole (REQUIP) 1 MG tablet Take 1 mg by mouth 4 (four) times daily.     . sennosides-docusate sodium (SENOKOT-S) 8.6-50 MG tablet Take 2 tablets by mouth at bedtime.    . torsemide (DEMADEX) 20 MG tablet Take 4 tablets (80 mg total) by mouth daily. 120 tablet 3  . traZODone (DESYREL) 50 MG tablet Take 50 mg by mouth at bedtime.     Marland Kitchen aspirin EC 81 MG tablet Take 1 tablet (81 mg total) by mouth daily. 90 tablet 3   No current facility-administered medications for this encounter.    No Known Allergies  Social History   Socioeconomic History  . Marital status: Widowed    Spouse name: Not on file  . Number of children: 3  . Years of education: Not on file  . Highest education level: Not on file  Occupational History  . Occupation: retired  Scientific laboratory technician  . Financial resource strain: Not on file  . Food insecurity:    Worry: Not on file    Inability: Not on file  . Transportation needs:    Medical: Not on file    Non-medical: Not on file  Tobacco Use  . Smoking status: Never Smoker  . Smokeless tobacco: Never Used  Substance and Sexual Activity  . Alcohol use: No    Alcohol/week: 0.0 oz  . Drug use: No  . Sexual activity: Never  Lifestyle  . Physical activity:    Days per week: Not on file    Minutes per session: Not on file  . Stress:  Not on file  Relationships  . Social connections:    Talks on phone: Not on file    Gets together: Not on file    Attends religious service: Not on file    Active member of club or organization: Not on file    Attends meetings of clubs or organizations: Not on file    Relationship status: Not  on file  . Intimate partner violence:    Fear of current or ex partner: Not on file    Emotionally abused: Not on file    Physically abused: Not on file    Forced sexual activity: Not on file  Other Topics Concern  . Not on file  Social History Narrative   Pt does use caffeine. Lives alone. 3 children. Retired.   Vitals:   11/29/17 1144  BP: (!) 139/52  Pulse: 88  SpO2: 95%  Weight: 271 lb 8 oz (123.2 kg)   Wt Readings from Last 3 Encounters:  11/29/17 271 lb 8 oz (123.2 kg)  09/03/17 266 lb 3.2 oz (120.7 kg)  06/15/17 266 lb (120.7 kg)   PHYSICAL EXAM: General: NAD Neck: JVP 8 cm, no thyromegaly or thyroid nodule.  Lungs: Clear to auscultation bilaterally with normal respiratory effort. CV: Nondisplaced PMI.  Heart regular S1/S2, no S3/S4, 2/6 early SEM RUSB.  Marked bilateral lower extremity lymphedema.  No carotid bruit. Unable to palpate pedal pulses due to lymphedema.  Abdomen: Soft, nontender, no hepatosplenomegaly, no distention.  Skin: Intact without lesions or rashes.  Neurologic: Alert and oriented x 3.  Psych: Normal affect. Extremities: No clubbing or cyanosis.  HEENT: Normal.   ASSESSMENT & PLAN: 1. Chronic diastolic CHF:  Echo (7/82) with EF 55-60%, normal RV.  Has Cardiomems. PADP at goal at 9 mmHg today (goal has been 6-13 mmHg).  She does not have significant JVD but lymphedema is increased.  Stable NYHA class III symptoms.  - She is only taking torsemide 40 mg daily.  I will have her increase this to 80 mg daily. BMET today and again in 10 days.  - She needs to take ASA 81 mg daily given Cardiomems present.  2. Lymphedema: She needs to continue to use lymphedema  pumps bid.  3. CKD: Stage 3 - BMET today.  4. Chest pain: LHC 2017 Nonobstructive.  None recently.   5. HTN:  Reasonably controlled.   6. DM2:  Per PCP  Followup 3 months.   Loralie Champagne, MD  11/30/2017

## 2017-12-03 ENCOUNTER — Ambulatory Visit (INDEPENDENT_AMBULATORY_CARE_PROVIDER_SITE_OTHER): Payer: Medicare Other | Admitting: Orthopedic Surgery

## 2017-12-03 ENCOUNTER — Encounter (INDEPENDENT_AMBULATORY_CARE_PROVIDER_SITE_OTHER): Payer: Self-pay | Admitting: Orthopedic Surgery

## 2017-12-03 DIAGNOSIS — M25511 Pain in right shoulder: Secondary | ICD-10-CM | POA: Diagnosis not present

## 2017-12-03 DIAGNOSIS — M19019 Primary osteoarthritis, unspecified shoulder: Secondary | ICD-10-CM

## 2017-12-03 DIAGNOSIS — G8929 Other chronic pain: Secondary | ICD-10-CM | POA: Diagnosis not present

## 2017-12-04 ENCOUNTER — Encounter (INDEPENDENT_AMBULATORY_CARE_PROVIDER_SITE_OTHER): Payer: Self-pay | Admitting: Orthopedic Surgery

## 2017-12-04 NOTE — Progress Notes (Signed)
Office Visit Note   Patient: [name]           Date of Birth: 07/23/1946           MRN: [id number] Visit Date: 12/03/2017 Requested by: [name] Housekeeper, MD [address] [place], [place] [address] PCP: [name] Housekeeper, MD  Subjective: Chief Complaint  Patient presents with  . Right Shoulder - Pain    HPI: [name] is a patient with right shoulder pain.  She has had a fall since her last office visit.  She reports decreased range of motion and significantly decreased function of the right shoulder.  Old radiographs are reviewed and she does have rotator cuff arthropathy affecting that right shoulder.  She does have 2 weightbearing shoulders because of her significant bilateral lower extremity edema and history of knee replacements.  Patient also has congestive heart failure and is on chronic pain medicine.  All of these are mitigating factors for surgical intervention and this is discussed with her.  She is doing well from her left reverse shoulder replacement performed in October 2018.  We did have to use a 155 degree angle prosthesis due to the weightbearing nature of her shoulder.              ROS: All systems reviewed are negative as they relate to the chief complaint within the history of present illness.  Patient denies  fevers or chills.   Assessment & Plan: Visit Diagnoses:  1. Shoulder arthritis   2. Chronic right shoulder pain     Plan: Impression is fairly diminished function in the right shoulder with rotator cuff arthropathy and good result in terms of pain from left reverse shoulder replacement.  She is gained about 20 pounds in fluid primarily in her legs but it is not going up into the chest according to a device that the cardiologist is using to monitor pulmonary edema.  We will get a preoperative thin cut CT scan to do preoperative templating on that right shoulder but she will need some type of medical tune up prior to surgical intervention.  Pain management  after surgery will also be difficult due to her chronic pain med use.  Risk and benefits of surgery including but not limited to infection nerve vessel damage dislocation and diminished shoulder functionality all discussed.  All questions answered.  Because she does have a weightbearing shoulder we would consider a higher ankle prosthesis but possibly with more lateralization.  This may put her at higher risk of acromial stress fracture but it may improve her function while maintaining a diminished risk of dislocation through the weightbearing shoulder.  Follow-Up Instructions: No follow-ups on file.   Orders:  Orders Placed This Encounter  Procedures  . CT SHOULDER RIGHT WO CONTRAST   No orders of the defined types were placed in this encounter.     Procedures: No procedures performed   Clinical Data: No additional findings.  Objective: Vital Signs: There were no vitals taken for this visit.  Physical Exam:   Constitutional: Patient appears well-developed HEENT:  Head: Normocephalic Eyes:EOM are normal Neck: Normal range of motion Cardiovascular: Normal rate Pulmonary/chest: Effort normal Neurologic: Patient is alert Skin: Skin is warm Psychiatric: Patient has normal mood and affect    Ortho Exam: Orthopedic exam demonstrates good motor sensory function in the right hand.  Radial pulses intact on the right.  Patient has about 10 or 20 degrees of forward flexion and abduction on the right-hand side but functional deltoid.  No other masses lymphadenopathy or skin changes noted in that shoulder girdle region.  There is a little bit of grinding with range of motion.  Prior surgical incision from distal clavicle excision and cuff repair is present.  Specialty Comments:  No specialty comments available.  Imaging: No results found.   PMFS History: Patient Active Problem List   Diagnosis Date Noted  . Shoulder arthritis 06/15/2017  . Status post revision of total knee  replacement, left 04/29/2017  . Polyethylene liner wear following left total knee arthroplasty requiring isolated polyethylene liner exchange ([place]) 03/02/2017  . 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 Unstable angina ([place]) 05/21/2016  . Chronic diastolic heart failure ([place]) 03/29/2016  . Normal coronary arteries 2009 01/14/2015  . Obesity-BMI 40 01/14/2015  . Restless leg 01/14/2015  . Chest pain 01/14/2015  . Back pain 01/14/2015  . Renal insufficiency 01/14/2015  . Dementia- mild memory issues 01/14/2015  . Occult blood positive stool 12/14/2014  . Anemia 12/14/2014  . Family history of colon cancer 12/14/2014  . Change in bowel habits 12/14/2014  . GERD (gastroesophageal reflux disease) 12/14/2014  . Dyspnea 05/17/2012  . Lymphedema 05/17/2012  . Weight gain 05/17/2012  . HTN (hypertension) 05/17/2012   Past Medical History:  Diagnosis Date  . Anemia   . Anxiety   . Arthritis    "hands, feet" (03/03/2017)  . Brain lesion    2 types  . Cervical spondylosis with myelopathy   . Chest pain    Normal cardiac cath 5/09  . Chronic lower back pain   . CKD (chronic kidney disease), stage III ([place])   . Congestive heart failure (CHF) ([place])    has a Cardiomems implant   . Constipation   . Dementia   . Depression   . Dumping syndrome   . Dyspnea    with activity  . Edema   . Fatigue   . Fibromyalgia   . GERD (gastroesophageal reflux disease)   . Headache    "w/high CBG" (03/03/2017)  . History of echocardiogram    a. Echo 03/20/16 (done at [facility] in [place], [place]):  mild LVH, EF 77%, normal diastolic function, mild LAE, MAC, RVSP 25 mmHg  . Hyperlipidemia   . Hypertension   . Hypothyroidism   . Lumbar spondylosis   . Lymphedema    seeing specialist for this  . Migraine    "mostly stopped when I changed my diet" (03/03/2017)  . OSA on CPAP   . Pneumonia X 1  . PONV (postoperative nausea and vomiting)   . Restless leg syndrome   .  Rheumatoid arthritis ([place])   . Stroke [facility])    TIAs "mini strokes"- unsure of last TIA - pt and daughter deny this  . Syncope   . Tremors of nervous system   . Type II diabetes mellitus (H[facility])     Family History  Problem Relation Age of Onset  . Heart disease Mother   . Heart failure Mother   . Kidney disease Mother   . Lung cancer Father   . Colon cancer Brother   . Cancer Brother        malignant thymoma  . Heart disease Brother   . Tremor Maternal Grandmother 90  . Ovarian cancer Maternal Grandmother   . Uterine cancer Maternal Grandmother   . Irritable bowel syndrome Daughter   . Esophageal cancer Neg Hx   . Stomach cancer Neg Hx   . Rectal cancer Neg  Hx     Past Surgical History:  Procedure Laterality Date  . APPENDECTOMY  1966  . BACK SURGERY    . CARDIAC CATHETERIZATION N/A 05/25/2016   Procedure: Right/Left Heart Cath and Coronary Angiography;  Surgeon: Larey Dresser, MD;  Location: [facility];  Service: Cardiovascular;  Laterality: N/A;  . CATARACT EXTRACTION W/ INTRAOCULAR LENS  IMPLANT, BILATERAL Bilateral   . COLONOSCOPY    . CORONARY ANGIOGRAM  2009   Normal coronaries  . EXCISION/RELEASE BURSA HIP Bilateral   . I&D KNEE WITH POLY EXCHANGE Left 03/02/2017   Procedure: Left knee Revision of poly liner;  Surgeon: Mcarthur Rossetti, MD;  Location: [facility];  Service: Orthopedics;  Laterality: Left;  . JOINT REPLACEMENT    . KNEE ARTHROSCOPY Bilateral   . LAPAROSCOPIC CHOLECYSTECTOMY  2015  . LUMBAR DISC SURGERY    . LUMBAR LAMINECTOMY/DECOMPRESSION MICRODISCECTOMY Left 12/2005   L2-3 laminectomy and diskectomy/notes 01/06/2011  . POSTERIOR LUMBAR FUSION  04/2006   [name] 01/06/2011; "put cages in"  . REVERSE SHOULDER ARTHROPLASTY Left 06/15/2017   Procedure: REVERSE LEFT SHOULDER ARTHROPLASTY;  Surgeon: Meredith Pel, MD;  Location: [facility];  Service: Orthopedics;  Laterality: Left;  . REVISION TOTAL KNEE ARTHROPLASTY Left 03/02/2017   poly  liner/notes 03/02/2017  . RIGHT HEART CATH N/A 11/13/2016   Procedure: Right Heart Cath with Cardiomems;  Surgeon: Larey Dresser, MD;  Location: [facility];  Service: Cardiovascular;  Laterality: N/A;  . SHOULDER ARTHROSCOPY WITH ROTATOR CUFF REPAIR Right   . SHOULDER OPEN ROTATOR CUFF REPAIR Left 01/2011   [name] 01/29/2011  . TOTAL ABDOMINAL HYSTERECTOMY    . TOTAL KNEE ARTHROPLASTY Left   . TOTAL KNEE ARTHROPLASTY Right 11/18/2012   Procedure: TOTAL KNEE ARTHROPLASTY;  Surgeon: Yvette Rack., MD;  Location: [facility];  Service: Orthopedics;  Laterality: Right;  . TUBAL LIGATION    . UPPER GI ENDOSCOPY     Social History   Occupational History  . Occupation: retired  Tobacco Use  . Smoking status: Never Smoker  . Smokeless tobacco: Never Used  Substance and Sexual Activity  . Alcohol use: No    Alcohol/week: 0.0 oz  . Drug use: No  . Sexual activity: Never

## 2017-12-09 ENCOUNTER — Ambulatory Visit
Admission: RE | Admit: 2017-12-09 | Discharge: 2017-12-09 | Disposition: A | Discharge status: Home / Self Care | Payer: Medicare Other | Source: Ambulatory Visit | Attending: Orthopedic Surgery | Admitting: Orthopedic Surgery

## 2017-12-09 DIAGNOSIS — G8929 Other chronic pain: Secondary | ICD-10-CM

## 2017-12-09 DIAGNOSIS — M25511 Pain in right shoulder: Principal | ICD-10-CM

## 2017-12-20 ENCOUNTER — Telehealth (INDEPENDENT_AMBULATORY_CARE_PROVIDER_SITE_OTHER): Payer: Self-pay | Admitting: Orthopedic Surgery

## 2017-12-20 NOTE — Telephone Encounter (Signed)
Surgery sheet was located.

## 2017-12-20 NOTE — Telephone Encounter (Signed)
Patient's daughter Lelon Frohlich Bullins) calling to schedule shoulder surgery for her mother.  She states that her mother is very uncomfortable. She would like to do this as soon as possible.   She's been to therapy, but unalble to do anything with the shoulder. Daughter does not think there will be an issue obtaining a clearance from the Cardiologist as patient was seen a few weeks ago.  I don't currently have a surgery sheet for patient.

## 2017-12-30 ENCOUNTER — Ambulatory Visit (INDEPENDENT_AMBULATORY_CARE_PROVIDER_SITE_OTHER): Payer: Medicare Other | Admitting: Orthopedic Surgery

## 2017-12-30 ENCOUNTER — Other Ambulatory Visit (INDEPENDENT_AMBULATORY_CARE_PROVIDER_SITE_OTHER): Payer: Self-pay

## 2017-12-30 DIAGNOSIS — M19011 Primary osteoarthritis, right shoulder: Secondary | ICD-10-CM

## 2018-01-03 ENCOUNTER — Ambulatory Visit (HOSPITAL_COMMUNITY)
Admission: RE | Admit: 2018-01-03 | Discharge: 2018-01-03 | Disposition: A | Discharge status: Home / Self Care | Payer: Medicare Other | Source: Ambulatory Visit | Attending: Cardiology | Admitting: Cardiology

## 2018-01-03 DIAGNOSIS — Z79899 Other long term (current) drug therapy: Secondary | ICD-10-CM | POA: Insufficient documentation

## 2018-01-03 DIAGNOSIS — I5032 Chronic diastolic (congestive) heart failure: Secondary | ICD-10-CM

## 2018-01-03 DIAGNOSIS — I509 Heart failure, unspecified: Secondary | ICD-10-CM | POA: Insufficient documentation

## 2018-01-03 NOTE — Progress Notes (Signed)
[place] [phone]) spoke w/Courtney, RN, gave orders to increase Torsemide to 80 mg BID for 2 days then resume 80 mg daily per Darrick Grinder, NP, she repeated orders back for verification.

## 2018-01-03 NOTE — Progress Notes (Signed)
   Cardiomems Update  RHC RA 4 PA 23/10 (15)  PCWP 5 PA 76% CO 9.1 CI 4.1     Implant Labs Creatinine 2.12 BUN 58 Potassium 3.9   Results for PAULENE, TAYAG (MRN [id number]) as of 01/03/2018 15:17  Ref. Range 11/29/2017 12:55  Creatinine Latest Ref Range: 0.44 - 1.00 mg/dL 1.25 (H)   PAD Range 6-13 Current PAD 16  meds - continue torsemide 80 mg /40 mg   Current Labs  09/03/2017 K 4 Creatinine 1.46  06/15/2017 K 3.8 Creatinine 1.46    I have reviewed current PAD from cardiomems device. PAD reading elevated.   Increase torsemide to 80 mg twice a day for 2 days. She will then go back torsemide 80 mg /40 mg.    Follow readings twice a week.    Amy Clegg NP-C  3:16 PM

## 2018-01-04 ENCOUNTER — Telehealth (HOSPITAL_COMMUNITY): Payer: Self-pay

## 2018-01-04 NOTE — Telephone Encounter (Signed)
Patient's daughter calling CHF clinic triage line continuing to report that [facility] is not ging correct dose of Torsemide and that weight is going up. Called and spoke with RN at [facility] who verbalized correct dose over the phone and that patient has not had significant changes overnight.

## 2018-01-10 ENCOUNTER — Ambulatory Visit
Admission: RE | Admit: 2018-01-10 | Discharge: 2018-01-10 | Disposition: A | Discharge status: Home / Self Care | Payer: Medicare Other | Source: Ambulatory Visit | Attending: Orthopedic Surgery | Admitting: Orthopedic Surgery

## 2018-01-10 DIAGNOSIS — M19011 Primary osteoarthritis, right shoulder: Secondary | ICD-10-CM

## 2018-01-25 ENCOUNTER — Other Ambulatory Visit (INDEPENDENT_AMBULATORY_CARE_PROVIDER_SITE_OTHER): Payer: Self-pay | Admitting: Orthopedic Surgery

## 2018-01-25 DIAGNOSIS — M12811 Other specific arthropathies, not elsewhere classified, right shoulder: Secondary | ICD-10-CM

## 2018-01-25 NOTE — Pre-Procedure Instructions (Addendum)
[name]  01/25/2018      [facility] [place] [address] [place] [address] Phone: [phone] Fax: [phone]    Your procedure is scheduled on  Tuesday 02/01/18  Report to [facility] Admitting at 900 A.M.  Call this number if you have problems the morning of surgery:  [phone]   Remember:  No food OR DRINK  after midnight.     Take these medicines the morning of surgery with A SIP OF WATER - AMLODIPINE (NORVASC), BACLOFEN, PULMICORT NEBULIZER, CLONIDINE (CATAPRES), DULOXETINE (CYMBALTA), GABAPENTIN, DUONEB IF NEEDED, LAMICTAL, LEVOTHYROXINE, OMEPRAZOLE (PRILOSEC),OXYCODONE IF NEEDED   7 days prior to surgery STOP taking any Aspirin(unless otherwise instructed by your surgeon), Aleve, Naproxen, Ibuprofen, Motrin, Advil, Goody's, BC's, all herbal medications, fish oil, and all vitamins  How to Manage Your Diabetes Before and After Surgery  Why is it important to control my blood sugar before and after surgery? . Improving blood sugar levels before and after surgery helps healing and can limit problems. . A way of improving blood sugar control is eating a healthy diet by: o  Eating less sugar and carbohydrates o  Increasing activity/exercise o  Talking with your doctor about reaching your blood sugar goals . High blood sugars (greater than 180 mg/dL) can raise your risk of infections and slow your recovery, so you will need to focus on controlling your diabetes during the weeks before surgery. . Make sure that the doctor who takes care of your diabetes knows about your planned surgery including the date and location.  How do I manage my blood sugar before surgery? . Check your blood sugar at least 4 times a day, starting 2 days before surgery, to make sure that the level is not too high or low. o Check your blood sugar the morning of your surgery when you wake up and every 2 hours until you get to the Short  Stay unit. . If your blood sugar is less than 70 mg/dL, you will need to treat for low blood sugar: o   juice (cranberry or apple), 4 glucose tablets, OR glucose gel. Recheck blood sugar in 15 minutes after treatment (to make sure it is greater than 70 mg/dL). If your blood sugar is not greater than 70 mg/dL on re o Do not take insulin. Treat a low blood sugar (less than 70 mg/dL) with  cup of clearcheck, call [phone] o  for further instructions. . Report your blood sugar to the short stay nurse when you get to Short Stay.  . If you are admitted to the hospital after surgery: o Your blood sugar will be checked by the staff and you will probably be given insulin after surgery (instead of oral diabetes medicines) to make sure you have good blood sugar levels. o The goal for blood sugar control after surgery is 80-180 mg/dL.  WHAT DO I DO ABOUT MY DIABETES MEDICATION?  Marland Kitchen Do not take oral diabetes medicines (pills) the morning of surgery.   . If your CBG is greater than 220 mg/dL, you may take  of your sliding scale (correction) dose of insulin.  Reviewed and Endorsed by [facility] Patient Education Committee, August 2015   Do not wear jewelry, make-up or nail polish.  Do not wear lotions, powders, or perfumes, or deodorant.  Do not shave 48 hours prior to surgery.  Men may shave face and neck.  Do not bring valuables to  the hospital.  [name] is not responsible for any belongings or valuables.  Contacts, dentures or bridgework may not be worn into surgery.  Leave your suitcase in the car.  After surgery it may be brought to your room.  For patients admitted to the hospital, discharge time will be determined by your treatment team.  Patients discharged the day of surgery will not be allowed to drive home.   Name and phone number of your driver:    Special instructions:  [name] - Preparing for Surgery  Before surgery, you can play an important role.  Because skin is  not sterile, your skin needs to be as free of germs as possible.  You can reduce the number of germs on you skin by washing with CHG (chlorahexidine gluconate) soap before surgery.  CHG is an antiseptic cleaner which kills germs and bonds with the skin to continue killing germs even after washing.  Oral Hygiene is also important in reducing the risk of infection.  Remember to brush your teeth with your regular toothpaste the morning of surgery.  Please DO NOT use if you have an allergy to CHG or antibacterial soaps.  If your skin becomes reddened/irritated stop using the CHG and inform your nurse when you arrive at Short Stay.  Do not shave (including legs and underarms) for at least 48 hours prior to the first CHG shower.  You may shave your face.  Please follow these instructions carefully:   1.  Shower with CHG Soap the night before surgery and the morning of Surgery.  2.  If you choose to wash your hair, wash your hair first as usual with your normal shampoo.  3.  After you shampoo, rinse your hair and body thoroughly to remove the shampoo. 4.  Use CHG as you would any other liquid soap.  You can apply chg directly to the skin and wash gently with a      scrungie or washcloth.           5.  Apply the CHG Soap to your body ONLY FROM THE NECK DOWN.   Do not use on open wounds or open sores. Avoid contact with your eyes, ears, mouth and genitals (private parts).  Wash genitals (private parts) with your normal soap.  6.  Wash thoroughly, paying special attention to the area where your surgery will be performed.  7.  Thoroughly rinse your body with warm water from the neck down.  8.  DO NOT shower/wash with your normal soap after using and rinsing off the CHG Soap.  9.  Pat yourself dry with a clean towel.            10.  Wear clean pajamas.            11.  Place clean sheets on your bed the night of your first shower and do not sleep with pets.  Day of Surgery  Do not apply any  lotions/deoderants the morning of surgery.   Please wear clean clothes to the hospital/surgery center. Remember to brush your teeth with toothpaste.     Please read over the following fact sheets that you were given. MRSA Information and Surgical Site Infection Prevention

## 2018-01-25 NOTE — Progress Notes (Signed)
SPOKE WITH DEBBIE AT DR. Randel Pigg OFFICE ABOUT NEEDING PREOP ORDERS.

## 2018-01-26 ENCOUNTER — Encounter (HOSPITAL_COMMUNITY): Payer: Self-pay

## 2018-01-26 ENCOUNTER — Other Ambulatory Visit: Payer: Self-pay

## 2018-01-26 ENCOUNTER — Encounter (HOSPITAL_COMMUNITY)
Admission: RE | Admit: 2018-01-26 | Discharge: 2018-01-26 | Disposition: A | Discharge status: Home / Self Care | Payer: Medicare Other | Source: Ambulatory Visit | Attending: Orthopedic Surgery | Admitting: Orthopedic Surgery

## 2018-01-26 DIAGNOSIS — M12811 Other specific arthropathies, not elsewhere classified, right shoulder: Secondary | ICD-10-CM | POA: Insufficient documentation

## 2018-01-26 DIAGNOSIS — Z79899 Other long term (current) drug therapy: Secondary | ICD-10-CM | POA: Insufficient documentation

## 2018-01-26 DIAGNOSIS — Z8673 Personal history of transient ischemic attack (TIA), and cerebral infarction without residual deficits: Secondary | ICD-10-CM | POA: Insufficient documentation

## 2018-01-26 DIAGNOSIS — E1122 Type 2 diabetes mellitus with diabetic chronic kidney disease: Secondary | ICD-10-CM | POA: Insufficient documentation

## 2018-01-26 DIAGNOSIS — I5032 Chronic diastolic (congestive) heart failure: Secondary | ICD-10-CM | POA: Diagnosis not present

## 2018-01-26 DIAGNOSIS — Z9842 Cataract extraction status, left eye: Secondary | ICD-10-CM | POA: Insufficient documentation

## 2018-01-26 DIAGNOSIS — K219 Gastro-esophageal reflux disease without esophagitis: Secondary | ICD-10-CM | POA: Insufficient documentation

## 2018-01-26 DIAGNOSIS — F419 Anxiety disorder, unspecified: Secondary | ICD-10-CM | POA: Diagnosis not present

## 2018-01-26 DIAGNOSIS — G4733 Obstructive sleep apnea (adult) (pediatric): Secondary | ICD-10-CM | POA: Diagnosis not present

## 2018-01-26 DIAGNOSIS — E785 Hyperlipidemia, unspecified: Secondary | ICD-10-CM | POA: Diagnosis not present

## 2018-01-26 DIAGNOSIS — Z96653 Presence of artificial knee joint, bilateral: Secondary | ICD-10-CM | POA: Diagnosis not present

## 2018-01-26 DIAGNOSIS — Z01812 Encounter for preprocedural laboratory examination: Secondary | ICD-10-CM | POA: Insufficient documentation

## 2018-01-26 DIAGNOSIS — M797 Fibromyalgia: Secondary | ICD-10-CM | POA: Diagnosis not present

## 2018-01-26 DIAGNOSIS — Z01818 Encounter for other preprocedural examination: Secondary | ICD-10-CM | POA: Insufficient documentation

## 2018-01-26 DIAGNOSIS — G2581 Restless legs syndrome: Secondary | ICD-10-CM | POA: Insufficient documentation

## 2018-01-26 DIAGNOSIS — N183 Chronic kidney disease, stage 3 (moderate): Secondary | ICD-10-CM | POA: Insufficient documentation

## 2018-01-26 DIAGNOSIS — Z961 Presence of intraocular lens: Secondary | ICD-10-CM | POA: Insufficient documentation

## 2018-01-26 DIAGNOSIS — Z96612 Presence of left artificial shoulder joint: Secondary | ICD-10-CM | POA: Insufficient documentation

## 2018-01-26 DIAGNOSIS — M069 Rheumatoid arthritis, unspecified: Secondary | ICD-10-CM | POA: Insufficient documentation

## 2018-01-26 DIAGNOSIS — Z7951 Long term (current) use of inhaled steroids: Secondary | ICD-10-CM | POA: Diagnosis not present

## 2018-01-26 DIAGNOSIS — F0150 Vascular dementia without behavioral disturbance: Secondary | ICD-10-CM | POA: Diagnosis not present

## 2018-01-26 DIAGNOSIS — E039 Hypothyroidism, unspecified: Secondary | ICD-10-CM | POA: Insufficient documentation

## 2018-01-26 DIAGNOSIS — F329 Major depressive disorder, single episode, unspecified: Secondary | ICD-10-CM | POA: Diagnosis not present

## 2018-01-26 DIAGNOSIS — Z79890 Hormone replacement therapy (postmenopausal): Secondary | ICD-10-CM | POA: Diagnosis not present

## 2018-01-26 DIAGNOSIS — I130 Hypertensive heart and chronic kidney disease with heart failure and stage 1 through stage 4 chronic kidney disease, or unspecified chronic kidney disease: Secondary | ICD-10-CM | POA: Diagnosis not present

## 2018-01-26 DIAGNOSIS — Z9841 Cataract extraction status, right eye: Secondary | ICD-10-CM | POA: Insufficient documentation

## 2018-01-26 DIAGNOSIS — Z7982 Long term (current) use of aspirin: Secondary | ICD-10-CM | POA: Diagnosis not present

## 2018-01-26 LAB — BASIC METABOLIC PANEL
Anion gap: 10 (ref 5–15)
BUN: 28 mg/dL — AB (ref 6–20)
CO2: 32 mmol/L (ref 22–32)
CREATININE: 1.47 mg/dL — AB (ref 0.44–1.00)
Calcium: 10.2 mg/dL (ref 8.9–10.3)
Chloride: 96 mmol/L — ABNORMAL LOW (ref 101–111)
GFR calc Af Amer: 40 mL/min — ABNORMAL LOW (ref >60)
GFR, EST NON AFRICAN AMERICAN: 34 mL/min — AB (ref >60)
Glucose, Bld: 98 mg/dL (ref 65–99)
Potassium: 4.1 mmol/L (ref 3.5–5.1)
SODIUM: 138 mmol/L (ref 135–145)

## 2018-01-26 LAB — CBC
HCT: 35.9 % — ABNORMAL LOW (ref 36.0–46.0)
Hemoglobin: 11.2 g/dL — ABNORMAL LOW (ref 12.0–15.0)
MCH: 25.7 pg — ABNORMAL LOW (ref 26.0–34.0)
MCHC: 31.2 g/dL (ref 30.0–36.0)
MCV: 82.3 fL (ref 78.0–100.0)
PLATELETS: 320 10*3/uL (ref 150–400)
RBC: 4.36 MIL/uL (ref 3.87–5.11)
RDW: 18.7 % — AB (ref 11.5–15.5)
WBC: 6.4 10*3/uL (ref 4.0–10.5)

## 2018-01-26 LAB — SURGICAL PCR SCREEN
MRSA, PCR: NEGATIVE
STAPHYLOCOCCUS AUREUS: NEGATIVE

## 2018-01-26 LAB — HEMOGLOBIN A1C
HEMOGLOBIN A1C: 5.8 % — AB (ref 4.8–5.6)
Mean Plasma Glucose: 119.76 mg/dL

## 2018-01-26 LAB — GLUCOSE, CAPILLARY: Glucose-Capillary: 121 mg/dL — ABNORMAL HIGH (ref 65–99)

## 2018-01-26 NOTE — Pre-Procedure Instructions (Signed)
[name]  01/26/2018      [facility] [place] [address] Phone: [phone] Fax: [phone]    Your procedure is scheduled on  Tuesday 02/01/18  Report to [facility] Admitting at 900 A.M.  Call this number if you have problems the morning of surgery:  [phone]   Remember:  No food OR DRINK  after midnight.     Take these medicines the morning of surgery with A SIP OF WATER - AMLODIPINE (NORVASC), BACLOFEN, PULMICORT NEBULIZER, CLONIDINE (CATAPRES), DULOXETINE (CYMBALTA), GABAPENTIN, DUONEB IF NEEDED, LAMICTAL, LEVOTHYROXINE, OMEPRAZOLE (PRILOSEC),OXYCODONE IF NEEDED   7 days prior to surgery STOP taking any Aspirin(unless otherwise instructed by your surgeon), Aleve, Naproxen, Ibuprofen, Motrin, Advil, Goody's, BC's, all herbal medications, fish oil, and all vitamins    How to Manage Your Diabetes Before and After Surgery  Why is it important to control my blood sugar before and after surgery? . Improving blood sugar levels before and after surgery helps healing and can limit problems. . A way of improving blood sugar control is eating a healthy diet by: o  Eating less sugar and carbohydrates o  Increasing activity/exercise o  Talking with your doctor about reaching your blood sugar goals . High blood sugars (greater than 180 mg/dL) can raise your risk of infections and slow your recovery, so you will need to focus on controlling your diabetes during the weeks before surgery. . Make sure that the doctor who takes care of your diabetes knows about your planned surgery including the date and location.  How do I manage my blood sugar before surgery? . Check your blood sugar at least 4 times a day, starting 2 days before surgery, to make sure that the level is not too high or low. o Check your blood sugar the morning of your surgery when you wake up and every 2 hours until you get to the  Short Stay unit. . If your blood sugar is less than 70 mg/dL, you will need to treat for low blood sugar: o Do not take insulin. o Treat a low blood sugar (less than 70 mg/dL) with  cup of clear juice (cranberry or apple), 4 glucose tablets, OR glucose gel. Recheck blood sugar in 15 minutes after treatment (to make sure it is greater than 70 mg/dL). If your blood sugar is not greater than 70 mg/dL on recheck, call [phone] for further instructions. . Report your blood sugar to the short stay nurse when you get to Short Stay.  . If you are admitted to the hospital after surgery: o Your blood sugar will be checked by the staff and you will probably be given insulin after surgery (instead of oral diabetes medicines) to make sure you have good blood sugar levels. o The goal for blood sugar control after surgery is 80-180 mg/dL.       Do not wear jewelry, make-up or nail polish.  Do not wear lotions, powders, or perfumes, or deodorant.  Do not shave 48 hours prior to surgery.  Men may shave face and neck.  Do not bring valuables to the hospital.  [facility] is not responsible for any belongings or valuables.  Contacts, dentures or bridgework may not be worn into surgery.  Leave your suitcase in the car.  After surgery it may be brought to your room.  For patients admitted to the hospital, discharge time will be determined by your  treatment team.  Patients discharged the day of surgery will not be allowed to drive home.   Name and phone number of your driver:    Special instructions:  [facility] - Preparing for Surgery  Before surgery, you can play an important role.  Because skin is not sterile, your skin needs to be as free of germs as possible.  You can reduce the number of germs on you skin by washing with CHG (chlorahexidine gluconate) soap before surgery.  CHG is an antiseptic cleaner which kills germs and bonds with the skin to continue killing germs even after washing.  Oral  Hygiene is also important in reducing the risk of infection.  Remember to brush your teeth with your regular toothpaste the morning of surgery.  Please DO NOT use if you have an allergy to CHG or antibacterial soaps.  If your skin becomes reddened/irritated stop using the CHG and inform your nurse when you arrive at Short Stay.  Do not shave (including legs and underarms) for at least 48 hours prior to the first CHG shower.  You may shave your face.  Please follow these instructions carefully:   1.  Shower with CHG Soap the night before surgery and the morning of Surgery.  2.  If you choose to wash your hair, wash your hair first as usual with your normal shampoo.  3.  After you shampoo, rinse your hair and body thoroughly to remove the shampoo. 4.  Use CHG as you would any other liquid soap.  You can apply chg directly to the skin and wash gently with a      scrungie or washcloth.           5.  Apply the CHG Soap to your body ONLY FROM THE NECK DOWN.   Do not use on open wounds or open sores. Avoid contact with your eyes, ears, mouth and genitals (private parts).  Wash genitals (private parts) with your normal soap.  6.  Wash thoroughly, paying special attention to the area where your surgery will be performed.  7.  Thoroughly rinse your body with warm water from the neck down.  8.  DO NOT shower/wash with your normal soap after using and rinsing off the CHG Soap.  9.  Pat yourself dry with a clean towel.            10.  Wear clean pajamas.            11.  Place clean sheets on your bed the night of your first shower and do not sleep with pets.  Day of Surgery  Do not apply any lotions/deoderants the morning of surgery.   Please wear clean clothes to the hospital/surgery center. Remember to brush your teeth with toothpaste.     Please read over the following fact sheets that you were given. MRSA Information and Surgical Site Infection Prevention

## 2018-01-27 ENCOUNTER — Telehealth (HOSPITAL_COMMUNITY): Payer: Self-pay

## 2018-01-27 NOTE — Telephone Encounter (Signed)
Patient requesting Rx for new comp hose. Rx faxed to [facility].  Renee Pain, RN

## 2018-01-27 NOTE — Progress Notes (Signed)
Anesthesia Chart Review:   Case:  [id number] Date/Time:  02/01/18 1047   Procedure:  RIGHT REVERSE SHOULDER ARTHROPLASTY (Right )   Anesthesia type:  General   Pre-op diagnosis:  RIGHT SHOULDER ROTATOR CUFF ARTHROPATHY   Location:  [facility] ROOM 06 / [facility]   Surgeon:  Meredith Pel, MD      DISCUSSION: - Pt is a 72 year old female with hx chronic diastolic CHF (s/p Cardiomems implantation 11/13/16), HTN, DM, TIA, syncope, CKD (stage III), OSA, brain lesion, vascular dementia/memory loss.   - Currently residing in SNF  - S/p L shoulder arthroplasty 06/15/17, s/p L knee revision 03/02/17   VS: BP (!) 127/53   Pulse 72   Temp 36.9 C   Resp 20   Ht _0  (1.676 m)   Wt 268 lb 8 oz (121.8 kg)   SpO2 96%   BMI 43.34 kg/m    PROVIDERS: PCP is Hilbert Corrigan, MD - Neurologist is Hyman Bower, MD (notes in care everywhere)  - Cardiologist is Loralie Champagne, MD. Last office visit 11/29/17  - Nephrologist is Pearson Grippe, MD   LABS: Labs reviewed: Acceptable for surgery. (all labs ordered are listed, but only abnormal results are displayed)  Labs Reviewed  GLUCOSE, CAPILLARY - Abnormal; Notable for the following components:      Result Value   Glucose-Capillary 121 (*)    All other components within normal limits  HEMOGLOBIN A1C - Abnormal; Notable for the following components:   Hgb A1c MFr Bld 5.8 (*)    All other components within normal limits  BASIC METABOLIC PANEL - Abnormal; Notable for the following components:   Chloride 96 (*)    BUN 28 (*)    Creatinine, Ser 1.47 (*)    GFR calc non Af Amer 34 (*)    GFR calc Af Amer 40 (*)    All other components within normal limits  CBC - Abnormal; Notable for the following components:   Hemoglobin 11.2 (*)    HCT 35.9 (*)    MCH 25.7 (*)    RDW 18.7 (*)    All other components within normal limits  SURGICAL PCR SCREEN    EKG 01/26/18: NSR. LVH with QRS widening   CV:  R cardiac cath 11/13/16:  1. Normal right  and left heart filling pressures.  2. Successful CardioMems placement.   Echo 10/13/16:  - Left ventricle: The cavity size was normal. There was mildconcentric hypertrophy. Systolic function was normal. Theestimated ejection fraction was in the range of 55% to 60%. Wallmotion was normal; there were no regional wall motionabnormalities. Doppler parameters are consistent with abnormalleft ventricular relaxation (grade 1 diastolic dysfunction). - Aortic valve: Transvalvular velocity was within the normal range.There was no stenosis. There was no regurgitation. - Mitral valve: There was trivial regurgitation. - Right ventricle: The cavity size was normal. Wall thickness wasnormal. Systolic function was normal. - Atrial septum: No defect or patent foramen ovale was identifiedby color flow Doppler. - Tricuspid valve: There was trivial regurgitation. - Pulmonary arteries: Systolic pressure was within the normalrange. PA peak pressure: 26 mm Hg (S).  R/L cardiac cath 05/25/16:  1. Normal filling pressures and cardiac output, no pulmonary hypertension.  2. No obstructive coronary artery disease (CX 30%).    Past Medical History:  Diagnosis Date  . Anemia   . Anxiety   . Arthritis    "hands, feet" (03/03/2017)  . Brain lesion    2 types  .  Cervical spondylosis with myelopathy   . Chest pain    Normal cardiac cath 5/09  . Chronic lower back pain   . CKD (chronic kidney disease), stage III ([place])    [name]  . Congestive heart failure (CHF) ([place])    has a Cardiomems implant   . Constipation   . Dementia   . Depression   . Dumping syndrome   . Dyspnea    with activity  . Edema   . Fatigue   . Fibromyalgia   . GERD (gastroesophageal reflux disease)   . Headache    "w/high CBG" (03/03/2017)  . History of echocardiogram    a. Echo 03/20/16 (done at [facility] in [place], [place]):  mild LVH, EF 68%, normal diastolic function, mild LAE, MAC, RVSP 25 mmHg  .  Hyperlipidemia   . Hypertension   . Hypothyroidism   . Lumbar spondylosis   . Lymphedema    seeing specialist for this  . Migraine    "mostly stopped when I changed my diet" (03/03/2017)  . OSA on CPAP   . Pneumonia X 1  . PONV (postoperative nausea and vomiting)   . Restless leg syndrome   . Rheumatoid arthritis ([place])   . Stroke [facility])    TIAs "mini strokes"- unsure of last TIA - pt and daughter deny this  . Syncope   . Tremors of nervous system   . Type II diabetes mellitus ([place])     Past Surgical History:  Procedure Laterality Date  . APPENDECTOMY  1966  . BACK SURGERY    . CARDIAC CATHETERIZATION N/A 05/25/2016   Procedure: Right/Left Heart Cath and Coronary Angiography;  Surgeon: Larey Dresser, MD;  Location: [facility];  Service: Cardiovascular;  Laterality: N/A;  . CATARACT EXTRACTION W/ INTRAOCULAR LENS  IMPLANT, BILATERAL Bilateral   . COLONOSCOPY    . CORONARY ANGIOGRAM  2009   Normal coronaries  . EXCISION/RELEASE BURSA HIP Bilateral   . I&D KNEE WITH POLY EXCHANGE Left 03/02/2017   Procedure: Left knee Revision of poly liner;  Surgeon: Mcarthur Rossetti, MD;  Location: [facility];  Service: Orthopedics;  Laterality: Left;  . JOINT REPLACEMENT    . KNEE ARTHROSCOPY Bilateral   . LAPAROSCOPIC CHOLECYSTECTOMY  2015  . LUMBAR DISC SURGERY    . LUMBAR LAMINECTOMY/DECOMPRESSION MICRODISCECTOMY Left 12/2005   L2-3 laminectomy and diskectomy/notes 01/06/2011  . POSTERIOR LUMBAR FUSION  04/2006   [name] 01/06/2011; "put cages in"  . REVERSE SHOULDER ARTHROPLASTY Left 06/15/2017   Procedure: REVERSE LEFT SHOULDER ARTHROPLASTY;  Surgeon: Meredith Pel, MD;  Location: [facility];  Service: Orthopedics;  Laterality: Left;  . REVISION TOTAL KNEE ARTHROPLASTY Left 03/02/2017   poly liner/notes 03/02/2017  . RIGHT HEART CATH N/A 11/13/2016   Procedure: Right Heart Cath with Cardiomems;  Surgeon: Larey Dresser, MD;  Location: [facility];  Service:  Cardiovascular;  Laterality: N/A;  . SHOULDER ARTHROSCOPY WITH ROTATOR CUFF REPAIR Right   . SHOULDER OPEN ROTATOR CUFF REPAIR Left 01/2011   [name] 01/29/2011  . TOTAL ABDOMINAL HYSTERECTOMY    . TOTAL KNEE ARTHROPLASTY Left   . TOTAL KNEE ARTHROPLASTY Right 11/18/2012   Procedure: TOTAL KNEE ARTHROPLASTY;  Surgeon: Yvette Rack., MD;  Location: [facility];  Service: Orthopedics;  Laterality: Right;  . TUBAL LIGATION    . UPPER GI ENDOSCOPY      MEDICATIONS: . acetaminophen (TYLENOL) 650 MG CR tablet  . amLODipine (NORVASC) 10  MG tablet  . antiseptic oral rinse (BIOTENE) LIQD  . aspirin EC 81 MG tablet  . baclofen (LIORESAL) 10 MG tablet  . budesonide (PULMICORT) 0.5 MG/2ML nebulizer solution  . Cholecalciferol (VITAMIN D3) 1000 units CAPS  . cloNIDine (CATAPRES) 0.1 MG tablet  . Cyanocobalamin (VITAMIN B-12 IJ)  . cyclobenzaprine (FLEXERIL) 5 MG tablet  . donepezil (ARICEPT) 5 MG tablet  . DULoxetine (CYMBALTA) 30 MG capsule  . fluticasone (FLONASE) 50 MCG/ACT nasal spray  . gabapentin (NEURONTIN) 300 MG capsule  . ipratropium-albuterol (DUONEB) 0.5-2.5 (3) MG/3ML SOLN  . lamoTRIgine (LAMICTAL) 100 MG tablet  . levothyroxine (SYNTHROID, LEVOTHROID) 88 MCG tablet  . magnesium hydroxide (MILK OF MAGNESIA) 400 MG/5ML suspension  . Melatonin 3 MG CAPS  . omeprazole (PRILOSEC) 20 MG capsule  . oxyCODONE-acetaminophen (PERCOCET) 7.5-325 MG tablet  . pravastatin (PRAVACHOL) 10 MG tablet  . pravastatin (PRAVACHOL) 40 MG tablet  . rOPINIRole (REQUIP) 1 MG tablet  . rOPINIRole (REQUIP) 2 MG tablet  . sennosides-docusate sodium (SENOKOT-S) 8.6-50 MG tablet  . torsemide (DEMADEX) 20 MG tablet  . traZODone (DESYREL) 50 MG tablet   No current facility-administered medications for this encounter.     If no changes, I anticipate pt can proceed with surgery as scheduled.   Willeen Cass, FNP-BC [facility]/Anesthesiology Phone: [phone] 01/27/2018 11:19  AM

## 2018-01-31 MED ORDER — DEXTROSE 5 % IV SOLN
3.0000 g | INTRAVENOUS | Status: AC
  Administered 2018-02-01: 3 g via INTRAVENOUS
  Filled 2018-01-31: qty 3

## 2018-02-01 ENCOUNTER — Encounter (HOSPITAL_COMMUNITY): Payer: Self-pay | Admitting: Surgery

## 2018-02-01 ENCOUNTER — Inpatient Hospital Stay (HOSPITAL_COMMUNITY): Payer: Medicare Other

## 2018-02-01 ENCOUNTER — Other Ambulatory Visit: Payer: Self-pay

## 2018-02-01 ENCOUNTER — Inpatient Hospital Stay (HOSPITAL_COMMUNITY)
Admission: RE | Admit: 2018-02-01 | Discharge: 2018-02-03 | DRG: 483 | Disposition: A | Discharge status: Skilled Nursing Facility | Payer: Medicare Other | Attending: Orthopedic Surgery | Admitting: Orthopedic Surgery

## 2018-02-01 ENCOUNTER — Inpatient Hospital Stay (HOSPITAL_COMMUNITY): Payer: Medicare Other | Admitting: Emergency Medicine

## 2018-02-01 ENCOUNTER — Encounter (HOSPITAL_COMMUNITY)
Admission: RE | Disposition: A | Discharge status: Skilled Nursing Facility | Payer: Self-pay | Source: Home / Self Care | Attending: Orthopedic Surgery

## 2018-02-01 ENCOUNTER — Inpatient Hospital Stay (HOSPITAL_COMMUNITY): Payer: Medicare Other | Admitting: Certified Registered"

## 2018-02-01 DIAGNOSIS — Z7982 Long term (current) use of aspirin: Secondary | ICD-10-CM | POA: Diagnosis not present

## 2018-02-01 DIAGNOSIS — I509 Heart failure, unspecified: Secondary | ICD-10-CM | POA: Diagnosis present

## 2018-02-01 DIAGNOSIS — G4733 Obstructive sleep apnea (adult) (pediatric): Secondary | ICD-10-CM | POA: Diagnosis present

## 2018-02-01 DIAGNOSIS — G2581 Restless legs syndrome: Secondary | ICD-10-CM | POA: Diagnosis present

## 2018-02-01 DIAGNOSIS — Z79891 Long term (current) use of opiate analgesic: Secondary | ICD-10-CM

## 2018-02-01 DIAGNOSIS — M12811 Other specific arthropathies, not elsewhere classified, right shoulder: Secondary | ICD-10-CM

## 2018-02-01 DIAGNOSIS — Z981 Arthrodesis status: Secondary | ICD-10-CM | POA: Diagnosis not present

## 2018-02-01 DIAGNOSIS — E1122 Type 2 diabetes mellitus with diabetic chronic kidney disease: Secondary | ICD-10-CM | POA: Diagnosis present

## 2018-02-01 DIAGNOSIS — Z96653 Presence of artificial knee joint, bilateral: Secondary | ICD-10-CM | POA: Diagnosis present

## 2018-02-01 DIAGNOSIS — E039 Hypothyroidism, unspecified: Secondary | ICD-10-CM | POA: Diagnosis present

## 2018-02-01 DIAGNOSIS — Z801 Family history of malignant neoplasm of trachea, bronchus and lung: Secondary | ICD-10-CM | POA: Diagnosis not present

## 2018-02-01 DIAGNOSIS — Z7951 Long term (current) use of inhaled steroids: Secondary | ICD-10-CM

## 2018-02-01 DIAGNOSIS — Z96612 Presence of left artificial shoulder joint: Secondary | ICD-10-CM | POA: Diagnosis present

## 2018-02-01 DIAGNOSIS — M19019 Primary osteoarthritis, unspecified shoulder: Secondary | ICD-10-CM

## 2018-02-01 DIAGNOSIS — F0390 Unspecified dementia without behavioral disturbance: Secondary | ICD-10-CM | POA: Diagnosis present

## 2018-02-01 DIAGNOSIS — N183 Chronic kidney disease, stage 3 (moderate): Secondary | ICD-10-CM | POA: Diagnosis present

## 2018-02-01 DIAGNOSIS — Z800 Family history of malignant neoplasm of digestive organs: Secondary | ICD-10-CM

## 2018-02-01 DIAGNOSIS — K219 Gastro-esophageal reflux disease without esophagitis: Secondary | ICD-10-CM | POA: Diagnosis present

## 2018-02-01 DIAGNOSIS — I130 Hypertensive heart and chronic kidney disease with heart failure and stage 1 through stage 4 chronic kidney disease, or unspecified chronic kidney disease: Secondary | ICD-10-CM | POA: Diagnosis present

## 2018-02-01 DIAGNOSIS — M19011 Primary osteoarthritis, right shoulder: Secondary | ICD-10-CM | POA: Diagnosis present

## 2018-02-01 DIAGNOSIS — Z8249 Family history of ischemic heart disease and other diseases of the circulatory system: Secondary | ICD-10-CM

## 2018-02-01 DIAGNOSIS — E785 Hyperlipidemia, unspecified: Secondary | ICD-10-CM | POA: Diagnosis present

## 2018-02-01 HISTORY — PX: REVERSE SHOULDER ARTHROPLASTY: SHX5054

## 2018-02-01 LAB — URINALYSIS, ROUTINE W REFLEX MICROSCOPIC
Bilirubin Urine: NEGATIVE
Glucose, UA: NEGATIVE mg/dL
HGB URINE DIPSTICK: NEGATIVE
Ketones, ur: NEGATIVE mg/dL
LEUKOCYTES UA: NEGATIVE
Nitrite: NEGATIVE
PROTEIN: NEGATIVE mg/dL
SPECIFIC GRAVITY, URINE: 1.008 (ref 1.005–1.030)
pH: 6 (ref 5.0–8.0)

## 2018-02-01 LAB — GLUCOSE, CAPILLARY
GLUCOSE-CAPILLARY: 93 mg/dL (ref 65–99)
GLUCOSE-CAPILLARY: 134 mg/dL — AB (ref 65–99)

## 2018-02-01 SURGERY — ARTHROPLASTY, SHOULDER, TOTAL, REVERSE
Anesthesia: General | Site: Shoulder | Laterality: Right

## 2018-02-01 MED ORDER — LEVOTHYROXINE SODIUM 88 MCG PO TABS
88.0000 ug | ORAL_TABLET | Freq: Every day | ORAL | Status: DC
  Administered 2018-02-02 – 2018-02-03 (×2): 88 ug via ORAL
  Filled 2018-02-01 (×2): qty 1

## 2018-02-01 MED ORDER — ACETAMINOPHEN 325 MG PO TABS
325.0000 mg | ORAL_TABLET | Freq: Four times a day (QID) | ORAL | Status: DC | PRN
  Administered 2018-02-03: 650 mg via ORAL
  Filled 2018-02-01: qty 2

## 2018-02-01 MED ORDER — LAMOTRIGINE 100 MG PO TABS
100.0000 mg | ORAL_TABLET | Freq: Two times a day (BID) | ORAL | Status: DC
  Administered 2018-02-01 – 2018-02-03 (×4): 100 mg via ORAL
  Filled 2018-02-01 (×4): qty 1

## 2018-02-01 MED ORDER — MAGNESIUM HYDROXIDE 400 MG/5ML PO SUSP: 30.0000 mL | Freq: Every day | ORAL | Status: DC | PRN

## 2018-02-01 MED ORDER — CHLORHEXIDINE GLUCONATE 4 % EX LIQD: 60.0000 mL | Freq: Once | CUTANEOUS | Status: DC

## 2018-02-01 MED ORDER — BIOTENE DRY MOUTH MT LIQD
10.0000 mL | Freq: Four times a day (QID) | OROMUCOSAL | Status: DC
  Administered 2018-02-01 – 2018-02-03 (×3): 10 mL via OROMUCOSAL

## 2018-02-01 MED ORDER — FENTANYL CITRATE (PF) 100 MCG/2ML IJ SOLN: 25.0000 ug | INTRAMUSCULAR | Status: DC | PRN

## 2018-02-01 MED ORDER — AMLODIPINE BESYLATE 5 MG PO TABS
10.0000 mg | ORAL_TABLET | Freq: Every day | ORAL | Status: DC
  Administered 2018-02-02 – 2018-02-03 (×2): 10 mg via ORAL
  Filled 2018-02-01 (×2): qty 2

## 2018-02-01 MED ORDER — ROCURONIUM BROMIDE 10 MG/ML (PF) SYRINGE
PREFILLED_SYRINGE | INTRAVENOUS | Status: DC | PRN
  Administered 2018-02-01: 50 mg via INTRAVENOUS

## 2018-02-01 MED ORDER — BUPIVACAINE HCL (PF) 0.5 % IJ SOLN
INTRAMUSCULAR | Status: DC | PRN
  Administered 2018-02-01: 15 mL via PERINEURAL

## 2018-02-01 MED ORDER — SUGAMMADEX SODIUM 200 MG/2ML IV SOLN
INTRAVENOUS | Status: DC | PRN
  Administered 2018-02-01: 200 mg via INTRAVENOUS

## 2018-02-01 MED ORDER — MIDAZOLAM HCL 2 MG/2ML IJ SOLN
INTRAMUSCULAR | Status: AC
  Filled 2018-02-01: qty 2

## 2018-02-01 MED ORDER — TORSEMIDE 20 MG PO TABS
80.0000 mg | ORAL_TABLET | ORAL | Status: AC
  Administered 2018-02-01: 80 mg via ORAL
  Filled 2018-02-01: qty 4

## 2018-02-01 MED ORDER — VANCOMYCIN HCL 1000 MG IV SOLR
INTRAVENOUS | Status: AC
  Filled 2018-02-01: qty 1000

## 2018-02-01 MED ORDER — PRAVASTATIN SODIUM 40 MG PO TABS
40.0000 mg | ORAL_TABLET | Freq: Every day | ORAL | Status: DC
  Administered 2018-02-01 – 2018-02-02 (×2): 40 mg via ORAL
  Filled 2018-02-01 (×2): qty 1

## 2018-02-01 MED ORDER — VITAMIN D 1000 UNITS PO TABS
1000.0000 [IU] | ORAL_TABLET | Freq: Every day | ORAL | Status: DC
  Administered 2018-02-02 – 2018-02-03 (×2): 1000 [IU] via ORAL
  Filled 2018-02-01 (×2): qty 1

## 2018-02-01 MED ORDER — DONEPEZIL HCL 5 MG PO TABS
5.0000 mg | ORAL_TABLET | Freq: Every day | ORAL | Status: DC
  Administered 2018-02-01 – 2018-02-02 (×2): 5 mg via ORAL
  Filled 2018-02-01 (×2): qty 1

## 2018-02-01 MED ORDER — ROCURONIUM BROMIDE 10 MG/ML (PF) SYRINGE
PREFILLED_SYRINGE | INTRAVENOUS | Status: AC
  Filled 2018-02-01: qty 5

## 2018-02-01 MED ORDER — CLONIDINE HCL 0.1 MG PO TABS
0.1000 mg | ORAL_TABLET | Freq: Two times a day (BID) | ORAL | Status: DC
Start: 2018-02-01 — End: 2018-02-03
  Administered 2018-02-01 – 2018-02-03 (×4): 0.1 mg via ORAL
  Filled 2018-02-01 (×4): qty 1

## 2018-02-01 MED ORDER — LACTATED RINGERS IV SOLN
INTRAVENOUS | Status: AC
  Administered 2018-02-01: 19:00:00 via INTRAVENOUS

## 2018-02-01 MED ORDER — ALBUTEROL SULFATE HFA 108 (90 BASE) MCG/ACT IN AERS
INHALATION_SPRAY | RESPIRATORY_TRACT | Status: AC
  Filled 2018-02-01: qty 6.7

## 2018-02-01 MED ORDER — TRAZODONE HCL 50 MG PO TABS
50.0000 mg | ORAL_TABLET | Freq: Every day | ORAL | Status: DC
  Administered 2018-02-01 – 2018-02-02 (×2): 50 mg via ORAL
  Filled 2018-02-01 (×2): qty 1

## 2018-02-01 MED ORDER — METOCLOPRAMIDE HCL 5 MG/ML IJ SOLN: 5.0000 mg | Freq: Three times a day (TID) | INTRAMUSCULAR | Status: DC | PRN

## 2018-02-01 MED ORDER — ONDANSETRON HCL 4 MG/2ML IJ SOLN: 4.0000 mg | Freq: Four times a day (QID) | INTRAMUSCULAR | Status: DC | PRN

## 2018-02-01 MED ORDER — ACETAMINOPHEN 160 MG/5ML PO SOLN: 325.0000 mg | ORAL | Status: DC | PRN

## 2018-02-01 MED ORDER — ROPINIROLE HCL 1 MG PO TABS
2.0000 mg | ORAL_TABLET | Freq: Two times a day (BID) | ORAL | Status: DC | PRN
  Administered 2018-02-01 – 2018-02-03 (×2): 2 mg via ORAL
  Filled 2018-02-01 (×2): qty 2

## 2018-02-01 MED ORDER — DEXTROSE 5 % IV SOLN
INTRAVENOUS | Status: DC | PRN
  Administered 2018-02-01: 13:00:00 via INTRAVENOUS
  Administered 2018-02-01: 40 ug/min via INTRAVENOUS

## 2018-02-01 MED ORDER — ACETAMINOPHEN 325 MG PO TABS: 325.0000 mg | ORAL_TABLET | ORAL | Status: DC | PRN

## 2018-02-01 MED ORDER — BACLOFEN 10 MG PO TABS: 10.0000 mg | ORAL_TABLET | Freq: Three times a day (TID) | ORAL | Status: DC

## 2018-02-01 MED ORDER — ONDANSETRON HCL 4 MG/2ML IJ SOLN
INTRAMUSCULAR | Status: AC
  Filled 2018-02-01: qty 2

## 2018-02-01 MED ORDER — GLYCOPYRROLATE PF 0.2 MG/ML IJ SOSY
PREFILLED_SYRINGE | INTRAMUSCULAR | Status: DC | PRN
  Administered 2018-02-01 (×2): .1 mg via INTRAVENOUS

## 2018-02-01 MED ORDER — HYDROMORPHONE HCL 2 MG/ML IJ SOLN
0.5000 mg | INTRAMUSCULAR | Status: DC | PRN
  Administered 2018-02-02: 1 mg via INTRAVENOUS
  Filled 2018-02-01: qty 1

## 2018-02-01 MED ORDER — CELECOXIB 200 MG PO CAPS
200.0000 mg | ORAL_CAPSULE | Freq: Two times a day (BID) | ORAL | Status: DC
  Administered 2018-02-01 – 2018-02-03 (×4): 200 mg via ORAL
  Filled 2018-02-01 (×4): qty 1

## 2018-02-01 MED ORDER — ONDANSETRON HCL 4 MG PO TABS: 4.0000 mg | ORAL_TABLET | Freq: Four times a day (QID) | ORAL | Status: DC | PRN

## 2018-02-01 MED ORDER — FENTANYL CITRATE (PF) 100 MCG/2ML IJ SOLN: INTRAMUSCULAR | Status: DC | PRN

## 2018-02-01 MED ORDER — FENTANYL CITRATE (PF) 250 MCG/5ML IJ SOLN
INTRAMUSCULAR | Status: AC
  Filled 2018-02-01: qty 5

## 2018-02-01 MED ORDER — VANCOMYCIN HCL 1000 MG IV SOLR
INTRAVENOUS | Status: DC | PRN
  Administered 2018-02-01: 1000 mg via TOPICAL

## 2018-02-01 MED ORDER — BUDESONIDE 0.5 MG/2ML IN SUSP
0.5000 mg | Freq: Two times a day (BID) | RESPIRATORY_TRACT | Status: DC
  Administered 2018-02-01 – 2018-02-03 (×4): 0.5 mg via RESPIRATORY_TRACT
  Filled 2018-02-01 (×5): qty 2

## 2018-02-01 MED ORDER — FENTANYL CITRATE (PF) 250 MCG/5ML IJ SOLN
INTRAMUSCULAR | Status: DC | PRN
  Administered 2018-02-01: 50 ug via INTRAVENOUS

## 2018-02-01 MED ORDER — CEFAZOLIN SODIUM-DEXTROSE 2-4 GM/100ML-% IV SOLN
2.0000 g | Freq: Four times a day (QID) | INTRAVENOUS | Status: AC
  Administered 2018-02-01 (×2): 2 g via INTRAVENOUS
  Filled 2018-02-01 (×2): qty 100

## 2018-02-01 MED ORDER — MENTHOL 3 MG MT LOZG: 1.0000 | LOZENGE | OROMUCOSAL | Status: DC | PRN

## 2018-02-01 MED ORDER — PROPOFOL 10 MG/ML IV BOLUS
INTRAVENOUS | Status: AC
  Filled 2018-02-01: qty 20

## 2018-02-01 MED ORDER — GABAPENTIN 300 MG PO CAPS
300.0000 mg | ORAL_CAPSULE | Freq: Two times a day (BID) | ORAL | Status: DC
  Administered 2018-02-01 – 2018-02-03 (×4): 300 mg via ORAL
  Filled 2018-02-01 (×4): qty 1

## 2018-02-01 MED ORDER — FENTANYL CITRATE (PF) 100 MCG/2ML IJ SOLN
25.0000 ug | INTRAMUSCULAR | Status: DC | PRN
  Administered 2018-02-01: 50 ug via INTRAVENOUS

## 2018-02-01 MED ORDER — DEXAMETHASONE SODIUM PHOSPHATE 10 MG/ML IJ SOLN
INTRAMUSCULAR | Status: DC | PRN
  Administered 2018-02-01: 5 mg via INTRAVENOUS

## 2018-02-01 MED ORDER — TORSEMIDE 20 MG PO TABS
80.0000 mg | ORAL_TABLET | Freq: Every day | ORAL | Status: DC
  Administered 2018-02-02 – 2018-02-03 (×2): 80 mg via ORAL
  Filled 2018-02-01 (×2): qty 4

## 2018-02-01 MED ORDER — PHENOL 1.4 % MT LIQD: 1.0000 | OROMUCOSAL | Status: DC | PRN

## 2018-02-01 MED ORDER — ONDANSETRON HCL 4 MG/2ML IJ SOLN
INTRAMUSCULAR | Status: DC | PRN
  Administered 2018-02-01: 4 mg via INTRAVENOUS

## 2018-02-01 MED ORDER — LACTATED RINGERS IV SOLN
INTRAVENOUS | Status: DC
  Administered 2018-02-01: 10:00:00 via INTRAVENOUS

## 2018-02-01 MED ORDER — DEXAMETHASONE SODIUM PHOSPHATE 10 MG/ML IJ SOLN
INTRAMUSCULAR | Status: AC
  Filled 2018-02-01: qty 1

## 2018-02-01 MED ORDER — BACLOFEN 10 MG PO TABS
10.0000 mg | ORAL_TABLET | ORAL | Status: DC
  Administered 2018-02-01 – 2018-02-03 (×6): 10 mg via ORAL
  Filled 2018-02-01 (×6): qty 1

## 2018-02-01 MED ORDER — IPRATROPIUM-ALBUTEROL 0.5-2.5 (3) MG/3ML IN SOLN
3.0000 mL | Freq: Four times a day (QID) | RESPIRATORY_TRACT | Status: DC | PRN
  Filled 2018-02-01: qty 3

## 2018-02-01 MED ORDER — DULOXETINE HCL 30 MG PO CPEP
30.0000 mg | ORAL_CAPSULE | Freq: Every day | ORAL | Status: DC
  Administered 2018-02-02 – 2018-02-03 (×2): 30 mg via ORAL
  Filled 2018-02-01 (×2): qty 1

## 2018-02-01 MED ORDER — LIDOCAINE 2% (20 MG/ML) 5 ML SYRINGE
INTRAMUSCULAR | Status: AC
  Filled 2018-02-01: qty 5

## 2018-02-01 MED ORDER — PROPOFOL 10 MG/ML IV BOLUS
INTRAVENOUS | Status: DC | PRN
  Administered 2018-02-01: 140 mg via INTRAVENOUS
  Administered 2018-02-01: 30 mg via INTRAVENOUS

## 2018-02-01 MED ORDER — METOCLOPRAMIDE HCL 5 MG PO TABS: 5.0000 mg | ORAL_TABLET | Freq: Three times a day (TID) | ORAL | Status: DC | PRN

## 2018-02-01 MED ORDER — ALBUTEROL SULFATE HFA 108 (90 BASE) MCG/ACT IN AERS
INHALATION_SPRAY | RESPIRATORY_TRACT | Status: DC | PRN
  Administered 2018-02-01: 2 via RESPIRATORY_TRACT

## 2018-02-01 MED ORDER — OXYCODONE HCL 5 MG PO TABS: 5.0000 mg | ORAL_TABLET | Freq: Once | ORAL | Status: DC | PRN

## 2018-02-01 MED ORDER — DOCUSATE SODIUM 100 MG PO CAPS
100.0000 mg | ORAL_CAPSULE | Freq: Two times a day (BID) | ORAL | Status: DC
  Administered 2018-02-01 – 2018-02-03 (×4): 100 mg via ORAL
  Filled 2018-02-01 (×4): qty 1

## 2018-02-01 MED ORDER — OXYCODONE HCL 5 MG PO TABS
5.0000 mg | ORAL_TABLET | ORAL | Status: DC | PRN
  Administered 2018-02-01 – 2018-02-03 (×9): 10 mg via ORAL
  Filled 2018-02-01 (×9): qty 2

## 2018-02-01 MED ORDER — FENTANYL CITRATE (PF) 100 MCG/2ML IJ SOLN
50.0000 ug | Freq: Once | INTRAMUSCULAR | Status: AC
  Administered 2018-02-01: 50 ug via INTRAVENOUS

## 2018-02-01 MED ORDER — LIDOCAINE 2% (20 MG/ML) 5 ML SYRINGE
INTRAMUSCULAR | Status: DC | PRN
  Administered 2018-02-01: 60 mg via INTRAVENOUS

## 2018-02-01 MED ORDER — FLUTICASONE PROPIONATE 50 MCG/ACT NA SUSP
1.0000 | Freq: Two times a day (BID) | NASAL | Status: DC
  Administered 2018-02-01 – 2018-02-03 (×4): 1 via NASAL
  Filled 2018-02-01: qty 16

## 2018-02-01 MED ORDER — BUPIVACAINE LIPOSOME 1.3 % IJ SUSP
INTRAMUSCULAR | Status: DC | PRN
  Administered 2018-02-01: 10 mL via PERINEURAL

## 2018-02-01 MED ORDER — ONDANSETRON HCL 4 MG/2ML IJ SOLN: 4.0000 mg | Freq: Once | INTRAMUSCULAR | Status: DC | PRN

## 2018-02-01 MED ORDER — PRAVASTATIN SODIUM 10 MG PO TABS
10.0000 mg | ORAL_TABLET | Freq: Every day | ORAL | Status: DC
  Administered 2018-02-01 – 2018-02-02 (×2): 10 mg via ORAL
  Filled 2018-02-01 (×2): qty 1

## 2018-02-01 MED ORDER — SENNOSIDES-DOCUSATE SODIUM 8.6-50 MG PO TABS
1.0000 | ORAL_TABLET | Freq: Two times a day (BID) | ORAL | Status: DC
  Administered 2018-02-01 – 2018-02-03 (×4): 1 via ORAL
  Filled 2018-02-01 (×4): qty 1

## 2018-02-01 MED ORDER — ASPIRIN EC 325 MG PO TBEC
325.0000 mg | DELAYED_RELEASE_TABLET | Freq: Every day | ORAL | Status: DC
  Administered 2018-02-02 – 2018-02-03 (×2): 325 mg via ORAL
  Filled 2018-02-01 (×2): qty 1

## 2018-02-01 MED ORDER — OXYCODONE HCL 5 MG/5ML PO SOLN: 5.0000 mg | Freq: Once | ORAL | Status: DC | PRN

## 2018-02-01 MED ORDER — PANTOPRAZOLE SODIUM 40 MG PO TBEC
40.0000 mg | DELAYED_RELEASE_TABLET | Freq: Every day | ORAL | Status: DC
  Administered 2018-02-02 – 2018-02-03 (×2): 40 mg via ORAL
  Filled 2018-02-01 (×2): qty 1

## 2018-02-01 MED ORDER — CYCLOBENZAPRINE HCL 10 MG PO TABS
10.0000 mg | ORAL_TABLET | ORAL | Status: DC
  Administered 2018-02-01 – 2018-02-03 (×4): 10 mg via ORAL
  Filled 2018-02-01 (×4): qty 1

## 2018-02-01 MED ORDER — FENTANYL CITRATE (PF) 100 MCG/2ML IJ SOLN
INTRAMUSCULAR | Status: AC
  Filled 2018-02-01: qty 2

## 2018-02-01 MED ORDER — 0.9 % SODIUM CHLORIDE (POUR BTL) OPTIME
TOPICAL | Status: DC | PRN
  Administered 2018-02-01 (×4): 1000 mL

## 2018-02-01 MED ORDER — ROPINIROLE HCL 1 MG PO TABS
2.0000 mg | ORAL_TABLET | Freq: Every day | ORAL | Status: DC
Start: 2018-02-01 — End: 2018-02-03
  Administered 2018-02-01 – 2018-02-02 (×2): 2 mg via ORAL
  Filled 2018-02-01 (×2): qty 2

## 2018-02-01 MED ORDER — FENTANYL CITRATE (PF) 100 MCG/2ML IJ SOLN
INTRAMUSCULAR | Status: AC
  Administered 2018-02-01: 50 ug via INTRAVENOUS
  Filled 2018-02-01: qty 2

## 2018-02-01 SURGICAL SUPPLY — 68 items
ALCOHOL 70% 16 OZ (MISCELLANEOUS) ×3 IMPLANT
BLADE SAW SGTL 13X75X1.27 (BLADE) ×3 IMPLANT
BSPLAT GLND +2X24 MDLR (Joint) ×1 IMPLANT
COVER SURGICAL LIGHT HANDLE (MISCELLANEOUS) ×3 IMPLANT
CUP SUT UNIV REVERS 36+2 RT (Cup) ×2 IMPLANT
DRAPE INCISE IOBAN 66X45 STRL (DRAPES) ×6 IMPLANT
DRAPE U-SHAPE 47X51 STRL (DRAPES) ×6 IMPLANT
DRSG AQUACEL AG ADV 3.5X10 (GAUZE/BANDAGES/DRESSINGS) ×3 IMPLANT
DURAPREP 26ML APPLICATOR (WOUND CARE) ×3 IMPLANT
ELECT BLADE 4.0 EZ CLEAN MEGAD (MISCELLANEOUS) ×3
ELECT REM PT RETURN 9FT ADLT (ELECTROSURGICAL) ×3
ELECTRODE BLDE 4.0 EZ CLN MEGD (MISCELLANEOUS) IMPLANT
ELECTRODE REM PT RTRN 9FT ADLT (ELECTROSURGICAL) ×1 IMPLANT
GLENOID UNI REV MOD (Joint) ×2 IMPLANT
GLENOID UNI REV MOD 24 +2 LAT (Joint) ×2 IMPLANT
GLOVE BIOGEL PI IND STRL 7.5 (GLOVE) ×1 IMPLANT
GLOVE BIOGEL PI IND STRL 8 (GLOVE) ×1 IMPLANT
GLOVE BIOGEL PI INDICATOR 7.5 (GLOVE) ×2
GLOVE BIOGEL PI INDICATOR 8 (GLOVE) ×2
GLOVE ECLIPSE 7.0 STRL STRAW (GLOVE) ×3 IMPLANT
GLOVE SURG ORTHO 8.0 STRL STRW (GLOVE) ×3 IMPLANT
GOWN STRL REUS W/ TWL LRG LVL3 (GOWN DISPOSABLE) ×2 IMPLANT
GOWN STRL REUS W/ TWL XL LVL3 (GOWN DISPOSABLE) IMPLANT
GOWN STRL REUS W/TWL LRG LVL3 (GOWN DISPOSABLE) ×6
GOWN STRL REUS W/TWL XL LVL3 (GOWN DISPOSABLE)
HYDROGEN PEROXIDE 16OZ (MISCELLANEOUS) ×3 IMPLANT
INSERT HUMERAL 36 +6 (Shoulder) ×2 IMPLANT
KIT BASIN OR (CUSTOM PROCEDURE TRAY) ×3 IMPLANT
KIT TURNOVER KIT B (KITS) ×3 IMPLANT
LINER HUMERAL 36 +3MM SM (Shoulder) ×2 IMPLANT
LOOP VESSEL MAXI BLUE (MISCELLANEOUS) IMPLANT
MANIFOLD NEPTUNE II (INSTRUMENTS) ×3 IMPLANT
NDL SUT 6 .5 CRC .975X.05 MAYO (NEEDLE) ×1 IMPLANT
NEEDLE MAYO TAPER (NEEDLE) ×3
NS IRRIG 1000ML POUR BTL (IV SOLUTION) ×3 IMPLANT
PACK SHOULDER (CUSTOM PROCEDURE TRAY) ×3 IMPLANT
PAD ARMBOARD 7.5X6 YLW CONV (MISCELLANEOUS) ×6 IMPLANT
PASSER SUT SWANSON 36MM LOOP (INSTRUMENTS) ×3 IMPLANT
SCREW CENTRAL MODULAR 25 (Screw) ×2 IMPLANT
SCREW PERI LOCK 5.5X16 (Screw) ×2 IMPLANT
SCREW PERI LOCK 5.5X24 (Screw) ×2 IMPLANT
SCREW PERI LOCK 5.5X32 (Screw) ×2 IMPLANT
SCREW PERIPHERAL 5.5X20 LOCK (Screw) ×2 IMPLANT
SCRUB BETADINE 4OZ XXX (MISCELLANEOUS) ×3 IMPLANT
SLING ARM IMMOBILIZER LRG (SOFTGOODS) ×1 IMPLANT
SLING ARM IMMOBILIZER XL (CAST SUPPLIES) ×2 IMPLANT
SPONGE LAP 18X18 RF (DISPOSABLE) ×2 IMPLANT
SPONGE LAP 18X18 X RAY DECT (DISPOSABLE) ×3 IMPLANT
STEM HUMERAL UNI REVERS SZ6 (Stem) ×2 IMPLANT
SUCTION FRAZIER HANDLE 10FR (MISCELLANEOUS) ×2
SUCTION TUBE FRAZIER 10FR DISP (MISCELLANEOUS) ×1 IMPLANT
SUT FIBERWIRE #2 38 T-5 BLUE (SUTURE) ×9
SUT MAXBRAID (SUTURE) IMPLANT
SUT MNCRL AB 3-0 PS2 18 (SUTURE) ×3 IMPLANT
SUT SILK 2 0 TIES 10X30 (SUTURE) ×3 IMPLANT
SUT VIC AB 0 CT1 27 (SUTURE) ×6
SUT VIC AB 0 CT1 27XBRD ANBCTR (SUTURE) ×2 IMPLANT
SUT VIC AB 1 CT1 27 (SUTURE) ×3
SUT VIC AB 1 CT1 27XBRD ANBCTR (SUTURE) ×1 IMPLANT
SUT VIC AB 2-0 CT1 27 (SUTURE) ×12
SUT VIC AB 2-0 CT1 TAPERPNT 27 (SUTURE) ×3 IMPLANT
SUT VICRYL 0 AB UR-6 (SUTURE) ×9 IMPLANT
SUTURE FIBERWR #2 38 T-5 BLUE (SUTURE) ×3 IMPLANT
SUTURE TAPE 1.3 40 TPR END (SUTURE) IMPLANT
SUTURETAPE 1.3 40 TPR END (SUTURE) ×9
TOWEL OR 17X26 10 PK STRL BLUE (TOWEL DISPOSABLE) ×3 IMPLANT
TRAY FOLEY BAG SILVER LF 16FR (CATHETERS) IMPLANT
WATER STERILE IRR 1000ML POUR (IV SOLUTION) ×3 IMPLANT

## 2018-02-01 NOTE — Op Note (Signed)
NAME: [name], [name] MEDICAL RECORD [id number] ACCOUNT [id number] DATE OF BIRTH:[birth year] FACILITY: [facility] LOCATION: [facility] PHYSICIAN:Luan Urbani Randel Pigg, MD  OPERATIVE REPORT  DATE OF PROCEDURE:  02/01/2018  PREOPERATIVE DIAGNOSIS:  Right shoulder rotator cuff arthropathy.  POSTOPERATIVE DIAGNOSIS:  Right shoulder rotator cuff arthropathy.  PROCEDURE:  Right shoulder reverse shoulder replacement using Arthrex components. 24 mm +2 lateralized universal modular baseplate with 25 mm central screw and 4 peripheral locking screws with a 36+2.5 glenosphere and a size 6 humeral stem.  SURGEON:  Yetta Barre. Marlou Sa, MD  ASSISTANT:  Talbert Nan RNFA.  INDICATIONS:  The patient is a 72 year old with right shoulder rotator cuff arthropathy who presents for operative management after explanation of risks and benefits.  DESCRIPTION OF PROCEDURE:  The patient was brought to the operating room where general anesthetic was induced.  Preoperative antibiotics administered.  Timeout was called.  The patient was placed in the beach chair position with the head in neutral  position.  Right arm prescrubbed with hydrogen peroxide followed by alcohol and Betadine, which was allowed to air dry and then prepped with DuraPrep solution and draped in a sterile manner.  Charlie Pitter was used to cover the entire operative field.  A  deltopectoral approach was made.  Skin and subcutaneous tissue sharply divided.  Cephalic vein was mobilized medially.  The patient had a significant rotator cuff pathology and only had the inferior half of her subscap intact.  Biceps tendon was  tenodesed to the pec tendon.  At this time, the circumflex vessels were ligated.  Axillary nerve was palpated and protected at all times during the case.  The inferior portion of the subscap and capsule was detached around to the 5 o'clock position as  well as 2 cm off the inferior humeral neck.  At this time, the head was dislocated, brown retractor was  placed.  The head was cut to accept 155 degree reverse because of the weightbearing nature of this shoulder.  Following the humeral head cut, a cap  was placed and attention was then directed towards the glenoid.  Bankart lesion was created from the 12 o'clock down to the 7 o'clock position.  This facilitated mobilization of the humeral head out of the way.  The guidepin was then placed with the  patient specific guide.  The reaming was then performed.  Bleeding subchondral bone was encountered.  A glenosphere was placed with excellent purchase obtained.  Four peripheral locking screws were also placed after appropriate measuring.  The  glenosphere was then placed into the position.  At this time, attention was then directed toward the humeral shaft.  Broaching was performed in about 30 degrees of retroversion.  The broaching up to a size 6 was performed.  Trial reduction was performed  with a +3 liner, which gave excellent stability.  Trial components were removed.  Drill holes were placed in the lesser tuberosity region and FiberTape sutures were passed.  The true stem was then passed and a +3 liner was placed.  With the new position,  the stability was not quite as good as it was with the trial.  The +6 was utilized and this was the final implant, which was utilized.  The patient had excellent stability in the overhead position.  It  had excellent stability with adduction, extension  and superior force.  At this time, thorough irrigation was again performed of the entire joint.  Vancomycin powder placed into the joint.  Subscap was repaired using 3 fiber loop type  sutures.  This was done with the arm at 45 degrees of external  rotation.  Deltopectoral interval was then closed using #1 Vicryl suture followed by interrupted inverted 0 Vicryl suture, 2-0 Vicryl suture and a 3-0 Monocryl.    The patient tolerated the procedure well without immediate complications and was transferred to the recovery room in  stable condition.  AN/NUANCE  D:02/01/2018 T:02/01/2018 JOB:[id number]

## 2018-02-01 NOTE — Brief Op Note (Signed)
02/01/2018  2:41 PM  PATIENT:  Norina Buzzard Leavy  72 y.o. female  PRE-OPERATIVE DIAGNOSIS:  RIGHT SHOULDER ROTATOR CUFF ARTHROPATHY  POST-OPERATIVE DIAGNOSIS:  RIGHT SHOULDER ROTATOR CUFF ARTHROPATHY  PROCEDURE:  Procedure(s): RIGHT REVERSE SHOULDER ARTHROPLASTY  SURGEON:  Surgeon(s): Marlou Sa, Tonna Corner, MD  ASSISTANT: Laure Kidney rnfa  ANESTHESIA:   general  EBL: 250 ml    Total I/O In: 500 [I.V.:500] Out: 1200 [Urine:900; Blood:300]  BLOOD ADMINISTERED: none  DRAINS: none   LOCAL MEDICATIONS USED:  none  SPECIMEN:  No Specimen  COUNTS:  YES  TOURNIQUET:  * No tourniquets in log *  DICTATION: .Other Dictation: Dictation Number [id number]  PLAN OF CARE: Admit to inpatient   PATIENT DISPOSITION:  PACU - hemodynamically stable

## 2018-02-01 NOTE — Transfer of Care (Signed)
Immediate Anesthesia Transfer of Care Note  Patient: [name]  Procedure(s) Performed: RIGHT REVERSE SHOULDER ARTHROPLASTY (Right Shoulder)  Patient Location: PACU  Anesthesia Type:General  Level of Consciousness: sedated  Airway & Oxygen Therapy: Patient Spontanous Breathing and Patient connected to face mask oxygen  Post-op Assessment: Report given to RN and Post -op Vital signs reviewed and stable  Post vital signs: Reviewed and stable  Last Vitals:  Vitals Value Taken Time  BP    Temp    Pulse    Resp    SpO2      Last Pain:  Vitals:   02/01/18 0938  TempSrc:   PainSc: 0-No pain      Patients Stated Pain Goal: 3 (30/14/99 6924)  Complications: No apparent anesthesia complications

## 2018-02-01 NOTE — Anesthesia Postprocedure Evaluation (Signed)
Anesthesia Post Note  Patient: [name]  Procedure(s) Performed: RIGHT REVERSE SHOULDER ARTHROPLASTY (Right Shoulder)     Patient location during evaluation: PACU Anesthesia Type: General Level of consciousness: awake Pain management: pain level controlled Vital Signs Assessment: post-procedure vital signs reviewed and stable Respiratory status: spontaneous breathing Cardiovascular status: stable Anesthetic complications: no    Last Vitals:  Vitals:   02/01/18 1507 02/01/18 1544  BP:  (!) 132/56  Pulse:  63  Resp:  15  Temp:    SpO2: 94%     Last Pain:  Vitals:   02/01/18 1455  TempSrc:   PainSc: Asleep                 [name]

## 2018-02-01 NOTE — Anesthesia Procedure Notes (Signed)
Anesthesia Regional Block: Interscalene brachial plexus block   Pre-Anesthetic Checklist: ,, timeout performed, Correct Patient, Correct Site, Correct Laterality, Correct Procedure, Correct Position, site marked, Risks and benefits discussed,  Surgical consent,  Pre-op evaluation,  At surgeon's request and post-op pain management  Laterality: Right  Prep: chloraprep       Needles:  Injection technique: Single-shot  Needle Type: Echogenic Stimulator Needle     Needle Length: 5cm  Needle Gauge: 22     Additional Needles:   Procedures:,,,, ultrasound used (permanent image in chart),,,,  Narrative:  Start time: 02/01/2018 10:26 AM End time: 02/01/2018 10:31 AM Injection made incrementally with aspirations every 5 mL.  Performed by: Personally  Anesthesiologist: Catalina Gravel, MD  Additional Notes: Functioning IV was confirmed and monitors were applied.  A 54mm 22ga Arrow echogenic stimulator needle was used. Sterile prep and drape, hand hygiene, and sterile gloves were used.  Negative aspiration and negative test dose prior to incremental administration of local anesthetic. The patient tolerated the procedure well.  Ultrasound guidance: relevent anatomy identified, needle position confirmed, local anesthetic spread visualized around nerve(s), vascular puncture avoided.  Image printed for medical record.

## 2018-02-01 NOTE — H&P (Signed)
[name] is an 72 y.o. female.   Chief Complaint: Right shoulder pain HPI: [name] is a 72 year old patient with right shoulder pain.  She has had multiple other lower extremity procedures.  She has rotator cuff arthropathy in the right shoulder.  She has had previous rotator cuff repair and distal clavicle excision.  She reports pain and decreased function with that right arm.  She does use both arms as weightbearing extremities when she gets up from a chair and when she uses a walker.  She had left reverse shoulder replacement done approximately 6 months ago and has had a good result with that shoulder in terms of pain relief and function.  She presents now for further treatment of her painful right shoulder.  Past Medical History:  Diagnosis Date  . Anemia   . Anxiety   . Arthritis    "hands, feet" (03/03/2017)  . Brain lesion    2 types  . Cervical spondylosis with myelopathy   . Chest pain    Normal cardiac cath 5/09  . Chronic lower back pain   . CKD (chronic kidney disease), stage III ([place])    [name]  . Congestive heart failure (CHF) ([place])    has a Cardiomems implant   . Constipation   . Dementia   . Depression   . Dumping syndrome   . Dyspnea    with activity  . Edema   . Fatigue   . Fibromyalgia   . GERD (gastroesophageal reflux disease)   . Headache    "w/high CBG" (03/03/2017)  . History of echocardiogram    a. Echo 03/20/16 (done at [facility] in [place], [place]):  mild LVH, EF 15%, normal diastolic function, mild LAE, MAC, RVSP 25 mmHg  . Hyperlipidemia   . Hypertension   . Hypothyroidism   . Lumbar spondylosis   . Lymphedema    seeing specialist for this  . Migraine    "mostly stopped when I changed my diet" (03/03/2017)  . OSA on CPAP   . Pneumonia X 1  . PONV (postoperative nausea and vomiting)   . Restless leg syndrome   . Rheumatoid arthritis ([place])   . Stroke [facility])    TIAs "mini strokes"- unsure of last TIA - pt and daughter  deny this  . Syncope   . Tremors of nervous system   . Type II diabetes mellitus ([place])     Past Surgical History:  Procedure Laterality Date  . APPENDECTOMY  1966  . BACK SURGERY    . CARDIAC CATHETERIZATION N/A 05/25/2016   Procedure: Right/Left Heart Cath and Coronary Angiography;  Surgeon: Larey Dresser, MD;  Location: [place] CV LAB;  Service: Cardiovascular;  Laterality: N/A;  . CATARACT EXTRACTION W/ INTRAOCULAR LENS  IMPLANT, BILATERAL Bilateral   . COLONOSCOPY    . CORONARY ANGIOGRAM  2009   Normal coronaries  . EXCISION/RELEASE BURSA HIP Bilateral   . I&D KNEE WITH POLY EXCHANGE Left 03/02/2017   Procedure: Left knee Revision of poly liner;  Surgeon: Mcarthur Rossetti, MD;  Location: [place];  Service: Orthopedics;  Laterality: Left;  . JOINT REPLACEMENT    . KNEE ARTHROSCOPY Bilateral   . LAPAROSCOPIC CHOLECYSTECTOMY  2015  . LUMBAR DISC SURGERY    . LUMBAR LAMINECTOMY/DECOMPRESSION MICRODISCECTOMY Left 12/2005   L2-3 laminectomy and diskectomy/notes 01/06/2011  . POSTERIOR LUMBAR FUSION  04/2006   [name] 01/06/2011; "put cages in"  . REVERSE SHOULDER ARTHROPLASTY Left 06/15/2017  Procedure: REVERSE LEFT SHOULDER ARTHROPLASTY;  Surgeon: Meredith Pel, MD;  Location: [place];  Service: Orthopedics;  Laterality: Left;  . REVISION TOTAL KNEE ARTHROPLASTY Left 03/02/2017   poly liner/notes 03/02/2017  . RIGHT HEART CATH N/A 11/13/2016   Procedure: Right Heart Cath with Cardiomems;  Surgeon: Larey Dresser, MD;  Location: [place] CV LAB;  Service: Cardiovascular;  Laterality: N/A;  . SHOULDER ARTHROSCOPY WITH ROTATOR CUFF REPAIR Right   . SHOULDER OPEN ROTATOR CUFF REPAIR Left 01/2011   [name] 01/29/2011  . TOTAL ABDOMINAL HYSTERECTOMY    . TOTAL KNEE ARTHROPLASTY Left   . TOTAL KNEE ARTHROPLASTY Right 11/18/2012   Procedure: TOTAL KNEE ARTHROPLASTY;  Surgeon: Yvette Rack., MD;  Location: [place];  Service: Orthopedics;  Laterality: Right;  . TUBAL LIGATION     . UPPER GI ENDOSCOPY      Family History  Problem Relation Age of Onset  . Heart disease Mother   . Heart failure Mother   . Kidney disease Mother   . Lung cancer Father   . Colon cancer Brother   . Cancer Brother        malignant thymoma  . Heart disease Brother   . Tremor Maternal Grandmother 90  . Ovarian cancer Maternal Grandmother   . Uterine cancer Maternal Grandmother   . Irritable bowel syndrome Daughter   . Esophageal cancer Neg Hx   . Stomach cancer Neg Hx   . Rectal cancer Neg Hx    Social History:  reports that she has never smoked. She has never used smokeless tobacco. She reports that she does not drink alcohol or use drugs.  Allergies: No Known Allergies  Medications Prior to Admission  Medication Sig Dispense Refill  . acetaminophen (TYLENOL) 650 MG CR tablet Take 650 mg by mouth every 4 (four) hours as needed for pain.    Marland Kitchen amLODipine (NORVASC) 10 MG tablet Take 1 tablet (10 mg total) by mouth daily. (Patient taking differently: Take 10 mg by mouth daily. (1000)) 30 tablet 6  . antiseptic oral rinse (BIOTENE) LIQD 10 mLs by Mouth Rinse route 4 (four) times daily. (0900, 1000, 1200 & 2100)    . aspirin EC 81 MG tablet Take 1 tablet (81 mg total) by mouth daily. (Patient taking differently: Take 81 mg by mouth daily. (0900)) 90 tablet 3  . baclofen (LIORESAL) 10 MG tablet Take 10 mg by mouth 3 (three) times daily. (0900, 1400, & 2100)    . budesonide (PULMICORT) 0.5 MG/2ML nebulizer solution Take 0.5 mg by nebulization 2 (two) times daily. (1000 & 2200)    . Cholecalciferol (VITAMIN D3) 1000 units CAPS Take 1,000 Units by mouth daily. (0900)    . cloNIDine (CATAPRES) 0.1 MG tablet Take 0.1 mg by mouth 2 (two) times daily. (0900 & 2100)    . Cyanocobalamin (VITAMIN B-12 IJ) Inject 1,000 mcg as directed every 30 (thirty) days. Weekly for 4 weeks, has 1 more dose this week (week of 7/2) then will switch to once a month     . cyclobenzaprine (FLEXERIL) 5 MG tablet  Take 10 mg by mouth 2 (two) times daily. (0800 & 2100)    . donepezil (ARICEPT) 5 MG tablet Take 5 mg by mouth daily at 10 pm. (2100)    . DULoxetine (CYMBALTA) 30 MG capsule Take 30 mg by mouth daily. (0900)    . fluticasone (FLONASE) 50 MCG/ACT nasal spray Place 1 spray into the nose 2 (two) times daily. (0900 &  2100)    . gabapentin (NEURONTIN) 300 MG capsule Take 300 mg by mouth 2 (two) times daily. (0900 & 2100)    . ipratropium-albuterol (DUONEB) 0.5-2.5 (3) MG/3ML SOLN Take 3 mLs by nebulization every 6 (six) hours as needed (FOR WHEEZING/SHORTNESS OF BREATH).    Marland Kitchen lamoTRIgine (LAMICTAL) 100 MG tablet Take 100 mg by mouth 2 (two) times daily. (0900 & 2100)    . levothyroxine (SYNTHROID, LEVOTHROID) 88 MCG tablet Take 88 mcg by mouth daily before breakfast. (0900)    . magnesium hydroxide (MILK OF MAGNESIA) 400 MG/5ML suspension Take 30 mLs by mouth daily as needed (FOR CONSTIPATION).    . Melatonin 3 MG CAPS Take 3 mg by mouth at bedtime. (2100)    . omeprazole (PRILOSEC) 20 MG capsule Take 20 mg by mouth 2 (two) times daily before a meal. (0900 & 1700)    . oxyCODONE-acetaminophen (PERCOCET) 7.5-325 MG tablet Take 1 tablet by mouth every 6 (six) hours. (0000, 0600, 1200 & 1800)    . pravastatin (PRAVACHOL) 10 MG tablet Take 10 mg by mouth daily. (2200)    . pravastatin (PRAVACHOL) 40 MG tablet Take 40 mg by mouth daily. (2200)    . rOPINIRole (REQUIP) 1 MG tablet Take 2 mg by mouth at bedtime. (2100)    . rOPINIRole (REQUIP) 2 MG tablet Take 2 mg by mouth every 12 (twelve) hours as needed (for restless leg).    . sennosides-docusate sodium (SENOKOT-S) 8.6-50 MG tablet Take 1 tablet by mouth 2 (two) times daily. (0900 & 2100)    . torsemide (DEMADEX) 20 MG tablet Take 4 tablets (80 mg total) by mouth daily. (Patient taking differently: Take 80 mg by mouth daily. (0900)) 120 tablet 3  . traZODone (DESYREL) 50 MG tablet Take 50 mg by mouth at bedtime. (2100)      Results for orders placed  or performed during the hospital encounter of 02/01/18 (from the past 48 hour(s))  Glucose, capillary     Status: None   Collection Time: 02/01/18  9:25 AM  Result Value Ref Range   Glucose-Capillary 93 65 - 99 mg/dL   No results found.  Review of Systems  Musculoskeletal: Positive for joint pain.  All other systems reviewed and are negative.   Pulse 63, temperature 98.2 F (36.8 C), temperature source Oral, resp. rate 20, SpO2 98 %. Physical Exam  Constitutional: She appears well-developed.  HENT:  Head: Normocephalic.  Eyes: Pupils are equal, round, and reactive to light.  Neck: Normal range of motion.  Cardiovascular: Normal rate.  Respiratory: Effort normal.  Neurological: She is alert.  Skin: Skin is warm.  Psychiatric: She has a normal mood and affect.  Examination of the right shoulder demonstrates forward flexion and abduction both below 90 degrees.  Rotator cuff weakness is present to infraspinatus and supraspinatus testing.  Patient has intact motor sensory function to the hand.  Deltoid does fire.  No other masses lymphadenopathy or skin changes noted in that right shoulder region.  Radial pulse is intact.  Assessment/Plan Impression is right shoulder rotator cuff arthropathy refractory to nonoperative management including injections and activity modification.  Plan is reverse shoulder replacement.  We will use higher angle device because of the weightbearing nature of her shoulder.  We will also use patient specific instrumentation to obtain maximal bone purchase and fixation in this patient who does weight-bear with her shoulders.  The risk of infection dislocation and potential need for more surgery is also discussed.  All  questions answered.  Anderson Malta, MD 02/01/2018, 10:31 AM

## 2018-02-01 NOTE — Progress Notes (Signed)
Having episodes of involuntary bilat leg movment and also c/o pain in them. States this happens when she "needs my torsemide"/. Spoke at bedside with Dr Therisa Doyne, orders for 80mg  torsemide rec'd

## 2018-02-01 NOTE — Anesthesia Procedure Notes (Signed)
Procedure Name: Intubation Date/Time: 02/01/2018 11:15 AM Performed by: Freddie Breech, CRNA Pre-anesthesia Checklist: Patient identified, Emergency Drugs available, Suction available and Patient being monitored Patient Re-evaluated:Patient Re-evaluated prior to induction Oxygen Delivery Method: Circle System Utilized Preoxygenation: Pre-oxygenation with 100% oxygen Induction Type: IV induction Ventilation: Mask ventilation without difficulty Laryngoscope Size: Mac and 3 Grade View: Grade I Tube type: Oral Tube size: 7.0 mm Number of attempts: 1 Airway Equipment and Method: Stylet and Oral airway Placement Confirmation: ETT inserted through vocal cords under direct vision,  positive ETCO2 and breath sounds checked- equal and bilateral Secured at: 21 cm Tube secured with: Tape Dental Injury: Teeth and Oropharynx as per pre-operative assessment

## 2018-02-01 NOTE — Anesthesia Preprocedure Evaluation (Signed)
Anesthesia Evaluation  Patient identified by MRN, date of birth, ID band Patient awake    Reviewed: Allergy & Precautions, NPO status , Patient's Chart, lab work & pertinent test results  History of Anesthesia Complications (+) PONV, AWARENESS UNDER ANESTHESIA and history of anesthetic complications  Airway Mallampati: II  TM Distance: >3 FB Neck ROM: Full    Dental no notable dental hx. (+) Edentulous Upper, Dental Advisory Given   Pulmonary shortness of breath, sleep apnea and Continuous Positive Airway Pressure Ventilation ,    Pulmonary exam normal breath sounds clear to auscultation       Cardiovascular hypertension, Pt. on medications + angina +CHF  (-) CAD and (-) Cardiac Stents Normal cardiovascular exam Rhythm:Regular Rate:Normal  Echo 2/18: Study Conclusions  - Left ventricle: The cavity size was normal. There was mild concentric hypertrophy. Systolic function was normal. The estimated ejection fraction was in the range of 55% to 60%. Wall motion was normal; there were no regional wall motion abnormalities. Doppler parameters are consistent with abnormal left ventricular relaxation (grade 1 diastolic dysfunction). - Aortic valve: Transvalvular velocity was within the normal range. There was no stenosis. There was no regurgitation. - Mitral valve: There was trivial regurgitation. - Right ventricle: The cavity size was normal. Wall thickness was normal. Systolic function was normal. - Atrial septum: No defect or patent foramen ovale was identified by color flow Doppler. - Tricuspid valve: There was trivial regurgitation. - Pulmonary arteries: Systolic pressure was within the normal range. PA peak pressure: 26 mm Hg (S).   Neuro/Psych  Headaches, PSYCHIATRIC DISORDERS Anxiety Depression Dementia TIA   GI/Hepatic Neg liver ROS, GERD  Medicated and Controlled,  Endo/Other  diabetes, Type 2Hypothyroidism   Renal/GU Renal  InsufficiencyRenal disease  negative genitourinary   Musculoskeletal  (+) Arthritis , Fibromyalgia -  Abdominal   Peds negative pediatric ROS (+)  Hematology  (+) Blood dyscrasia, anemia ,   Anesthesia Other Findings   Reproductive/Obstetrics negative OB ROS                             Anesthesia Physical  Anesthesia Plan  ASA: III  Anesthesia Plan: General   Post-op Pain Management: GA combined w/ Regional for post-op pain   Induction: Intravenous  PONV Risk Score and Plan: 4 or greater and Ondansetron, Dexamethasone and Treatment may vary due to age or medical condition  Airway Management Planned: Oral ETT  Additional Equipment:   Intra-op Plan:   Post-operative Plan: Extubation in OR  Informed Consent: I have reviewed the patients History and Physical, chart, labs and discussed the procedure including the risks, benefits and alternatives for the proposed anesthesia with the patient or authorized representative who has indicated his/her understanding and acceptance.   Dental advisory given  Plan Discussed with: CRNA and Anesthesiologist  Anesthesia Plan Comments:         Anesthesia Quick Evaluation

## 2018-02-02 ENCOUNTER — Other Ambulatory Visit: Payer: Self-pay

## 2018-02-02 ENCOUNTER — Encounter (HOSPITAL_COMMUNITY): Payer: Self-pay | Admitting: General Practice

## 2018-02-02 LAB — GLUCOSE, CAPILLARY
GLUCOSE-CAPILLARY: 144 mg/dL — AB (ref 65–99)
Glucose-Capillary: 119 mg/dL — ABNORMAL HIGH (ref 65–99)
Glucose-Capillary: 151 mg/dL — ABNORMAL HIGH (ref 65–99)

## 2018-02-02 LAB — URINE CULTURE: Culture: <10000 — AB

## 2018-02-02 NOTE — Social Work (Signed)
CSW aware pt from [facility], CSW checking with SNF to see if pt is currently covered under Medicare or Medicaid.   Alexander Mt, [place]a Work [phone]

## 2018-02-02 NOTE — Progress Notes (Signed)
RT was called because patient stated that the CPAP pressure seemed to be to much. RT turned CPAP down to 9 cmH2O.  RT advised patient of the change and the patient stated that the mask was also uncomfortable. RT readjusted the mask and advised patient that she could have someone bring home mask later and we could switch out. Patient exhibited understanding and stated that the present settings and mask would be fine for now.     02/02/18 0421  BiPAP/CPAP/SIPAP  BiPAP/CPAP/SIPAP Pt Type Adult  Mask Type Full face mask  Mask Size Small  Respiratory Rate 20 breaths/min  EPAP 9 cmH2O (per patient request)  Flow Rate 2 lpm  BiPAP/CPAP/SIPAP CPAP  Patient Home Equipment No  Auto Titrate No

## 2018-02-02 NOTE — Evaluation (Signed)
Occupational Therapy Evaluation Patient Details Name: [name] MRN: [id number] DOB: [birth year] Today's Date: 02/02/2018    History of Present Illness Pt is a 72 y.o. female s/p R reverse TSA. PMHx: Anxiety, Brain lesion, Cervical spondylosis with myelopathy, Chest pain, Chronic low back pain, CKD, CHF, Dementia, Depression, Dumping syndrome, Dyspnea, Fibromyalgia, GERD, Hyperlipidemia, HTN, Hypothyroidism, Lymphedema, Restless leg syndrome, RA, TIA, DM2, L TSA 2018, Bil TKA, L2-3 microdiscectomy and poserior fusion, Heart cath.    Clinical Impression   Pt reports she required minimal assist with ADL PTA from staff at SNF. Currently pt overall max assist for ADL. Attempted sit to stand from low chair with max assist +1 and pt unable to achieve standing; will require +2 assist from low surfaces at this time. Pt tolerated AROM elbow/wrist/hand x10 each this session and verbalized understanding of shoulder and ADL education provided. Pt planning to return to SNF [facility]) upon d/c; agree with short term SNF for continued rehab. Pt would benefit from continued skilled OT to address established goals.    Follow Up Recommendations  SNF;Supervision/Assistance - 24 hour    Equipment Recommendations  Other (comment)(TBD at next venue)    Recommendations for Other Services PT consult     Precautions / Restrictions Precautions Precautions: Fall;Shoulder Type of Shoulder Precautions: AROM elbow/wrist/hand OK. PROM/AROM shoulder FF 90, ABD 60, ER 30. Shoulder Interventions: Shoulder sling/immobilizer;For comfort(and for sleep) Precaution Booklet Issued: No Required Braces or Orthoses: Sling Restrictions Weight Bearing Restrictions: Yes RUE Weight Bearing: Non weight bearing      Mobility Bed Mobility               General bed mobility comments: Pt OOB in chair upon arrival  Transfers Overall transfer level: Needs assistance               General transfer comment:  Attempted sit to stand x3 from low recliner chair with max assist +1; unsuccessful in getting pt into standing. Will need +2 assist for mobility.    Balance                                           ADL either performed or assessed with clinical judgement   ADL Overall ADL's : Needs assistance/impaired Eating/Feeding: Minimal assistance;Sitting   Grooming: Minimal assistance;Sitting   Upper Body Bathing: Maximal assistance;Sitting   Lower Body Bathing: Maximal assistance;Sit to/from stand   Upper Body Dressing : Maximal assistance;Sitting   Lower Body Dressing: Maximal assistance;Sit to/from Health and safety inspector Details (indicate cue type and reason): Pt currently with foley catheter           General ADL Comments: Attempted sit to stand x3 from low recliner chair with max assist; unable to boost up into standing. Began education regarding sling management and wear schedule, ice for edema/pain, proper positioning of RUE in chair, elbow/wrist/hand ROM exercises, UB bathing/dressing technique.     Vision         Perception     Praxis      Pertinent Vitals/Pain Pain Assessment: Faces Faces Pain Scale: Hurts little more Pain Location: R shoulder Pain Descriptors / Indicators: Discomfort;Sore Pain Intervention(s): Monitored during session;Limited activity within patient's tolerance;Ice applied     Hand Dominance Right   Extremity/Trunk Assessment Upper Extremity Assessment Upper Extremity Assessment: RUE deficits/detail RUE Deficits / Details: AROM elbow, wrist, hand WFL. Nerve  block beginning to wear off, still some numbness at forearm to shoulder RUE: Unable to fully assess due to pain;Unable to fully assess due to immobilization RUE Sensation: decreased light touch   Lower Extremity Assessment Lower Extremity Assessment: Defer to PT evaluation       Communication Communication Communication: No difficulties   Cognition  Arousal/Alertness: Awake/alert Behavior During Therapy: WFL for tasks assessed/performed Overall Cognitive Status: Within Functional Limits for tasks assessed                                     General Comments       Exercises Exercises: Shoulder Shoulder Exercises Elbow Flexion: AROM;Right;10 reps;Seated Elbow Extension: AROM;Right;10 reps;Seated Wrist Flexion: AROM;Right;10 reps;Seated Wrist Extension: AROM;Right;10 reps;Seated Digit Composite Flexion: AROM;Right;10 reps;Seated Composite Extension: AROM;Right;10 reps;Seated   Shoulder Instructions Shoulder Instructions Donning/doffing shirt without moving shoulder: Maximal assistance Method for sponge bathing under operated UE: Maximal assistance Donning/doffing sling/immobilizer: Maximal assistance Correct positioning of sling/immobilizer: Maximal assistance ROM for elbow, wrist and digits of operated UE: Supervision/safety Sling wearing schedule (on at all times/off for ADL's): Supervision/safety Proper positioning of operated UE when showering: Maximal assistance Positioning of UE while sleeping: Maximal assistance    Home Living Family/patient expects to be discharged to:: Skilled nursing facility                                 Additional Comments: Pt from [facility] with plans to return      Prior Functioning/Environment Level of Independence: Needs assistance  Gait / Transfers Assistance Needed: Pt ambulating with use of RW ADL's / Homemaking Assistance Needed: Pt required min assist for ADL            OT Problem List: Decreased strength;Decreased range of motion;Decreased activity tolerance;Impaired balance (sitting and/or standing);Decreased knowledge of precautions;Obesity;Impaired UE functional use;Pain;Increased edema      OT Treatment/Interventions: Self-care/ADL training;Therapeutic exercise;Energy conservation;DME and/or AE instruction;Therapeutic  activities;Patient/family education;Balance training    OT Goals(Current goals can be found in the care plan section) Acute Rehab OT Goals Patient Stated Goal: return to rehab OT Goal Formulation: With patient Time For Goal Achievement: 02/16/18 Potential to Achieve Goals: Good ADL Goals Pt Will Perform Upper Body Bathing: with min assist;sitting Pt Will Perform Upper Body Dressing: with min assist;sitting Pt Will Transfer to Toilet: with min assist;ambulating;bedside commode Pt/caregiver will Perform Home Exercise Program: Right Upper extremity;Increased ROM;With written HEP provided;Independently(pt will perform AROM elbow/wrist/hand)  OT Frequency: Min 2X/week   Barriers to D/C:            Co-evaluation              AM-PAC PT "6 Clicks" Daily Activity     Outcome Measure Help from another person eating meals?: A Little Help from another person taking care of personal grooming?: A Little Help from another person toileting, which includes using toliet, bedpan, or urinal?: A Lot Help from another person bathing (including washing, rinsing, drying)?: A Lot Help from another person to put on and taking off regular upper body clothing?: A Lot Help from another person to put on and taking off regular lower body clothing?: A Lot 6 Click Score: 14   End of Session Equipment Utilized During Treatment: Other (comment)(sling)  Activity Tolerance: Patient tolerated treatment well Patient left: in chair;with call bell/phone within reach;with nursing/sitter in room  OT Visit Diagnosis: Unsteadiness on feet (R26.81);Muscle weakness (generalized) (M62.81);Pain Pain - Right/Left: Right Pain - part of body: Shoulder                Time: 2481-8590 OT Time Calculation (min): 30 min Charges:  OT General Charges $OT Visit: 1 Visit OT Evaluation $OT Eval Moderate Complexity: 1 Mod OT Treatments $Self Care/Home Management : 8-22 mins G-Codes:     Danzig Macgregor A. Ulice Brilliant, M.S., OTR/L Acute  Rehab Department: [phone]  Binnie Kand 02/02/2018, 11:03 AM

## 2018-02-02 NOTE — Progress Notes (Signed)
RT placed patient on CPAP of 11cmH2O via FFM with 2 L O2 bleed in. Patient is tolerating the CPAP at this time. RN notified.    02/02/18 0328  BiPAP/CPAP/SIPAP  BiPAP/CPAP/SIPAP Pt Type Adult  Mask Type Full face mask  Mask Size Small  Respiratory Rate 18 breaths/min  EPAP 11 cmH2O  Flow Rate 2 lpm  BiPAP/CPAP/SIPAP CPAP  Patient Home Equipment No  Auto Titrate No  BiPAP/CPAP /SiPAP Vitals  Resp 18  Bilateral Breath Sounds Clear;Diminished

## 2018-02-02 NOTE — Progress Notes (Signed)
RT entered room and patient had her family member bring her home machine and mask. RT was informed by the patient that she had dropped her mask at the SNF and that the mask was loose.  Upon taking the mask out of the patient's CPAP case, RT noticed that part of the mask was loose; RT was able to get the mask put together but informed the patient that one of the brackets on the mask may have broken off when she dropped at the SNF.  Patient is able to wear the CPAP at this time and able to apply the mask with some assistance due to her shoulder. RT informed RN on duty and advised if there were any assistance needed to give respiratory a call.     02/02/18 2250  BiPAP/CPAP/SIPAP  BiPAP/CPAP/SIPAP Pt Type Adult  Mask Type Full face mask  Mask Size Small  Respiratory Rate 18 breaths/min  EPAP  (patient has home machine at this time)  Oxygen Percent 21 %  BiPAP/CPAP/SIPAP CPAP  Patient Home Equipment Yes  Auto Titrate No

## 2018-02-02 NOTE — NC FL2 (Signed)
MEDICAID FL2 LEVEL OF CARE SCREENING TOOL     IDENTIFICATION  Patient Name: [name] Birthdate: 1945-12-30 Sex: female Admission Date (Current Location): 02/01/2018  [place] and [place] Number:  Johnnette Litter [id number] [place] and Address:  [facility] [place], [place] [address] [place], [place] [address]      Provider Number: [id number]  Attending Physician Name and Address:  Meredith Pel, MD  Relative Name and Phone Number:  [name]    Current Level of Care: Hospital Recommended Level of Care: [place] Prior Approval Number:    Date Approved/Denied:   PASRR Number: [id number]  Discharge Plan: SNF    Current Diagnoses: Patient Active Problem List   Diagnosis Date Noted  . Rotator cuff arthropathy of right shoulder   . Shoulder arthritis 06/15/2017  . Status post revision of total knee replacement, left 04/29/2017  . Polyethylene liner wear following left total knee arthroplasty requiring isolated polyethylene liner exchange ([place]) 03/02/2017  . 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 Unstable angina ([place]) 05/21/2016  . Chronic diastolic heart failure ([place]) 03/29/2016  . Normal coronary arteries 2009 01/14/2015  . Obesity-BMI 40 01/14/2015  . Restless leg 01/14/2015  . Chest pain 01/14/2015  . Back pain 01/14/2015  . Renal insufficiency 01/14/2015  . Dementia- mild memory issues 01/14/2015  . Occult blood positive stool 12/14/2014  . Anemia 12/14/2014  . Family history of colon cancer 12/14/2014  . Change in bowel habits 12/14/2014  . GERD (gastroesophageal reflux disease) 12/14/2014  . Dyspnea 05/17/2012  . Lymphedema 05/17/2012  . Weight gain 05/17/2012  . HTN (hypertension) 05/17/2012    Orientation RESPIRATION BLADDER Height & Weight     Self, Time, Situation, Place  Normal Continent, Indwelling catheter Weight:   Height:     BEHAVIORAL SYMPTOMS/MOOD NEUROLOGICAL BOWEL  NUTRITION STATUS      Continent Diet(see discharge summary)  AMBULATORY STATUS COMMUNICATION OF NEEDS Skin     Verbally Surgical wounds(surgical incision on right shoulder with aquacell dressing)                       Personal Care Assistance Level of Assistance  Bathing, Feeding, Dressing Bathing Assistance: Maximum assistance Feeding assistance: Limited assistance Dressing Assistance: Maximum assistance     Functional Limitations Info  Sight, Hearing, Speech Sight Info: Adequate Hearing Info: Adequate Speech Info: Adequate    SPECIAL CARE FACTORS FREQUENCY  PT (By licensed PT), OT (By licensed OT)     PT Frequency: 5x week OT Frequency: 5x week            Contractures Contractures Info: Not present    Additional Factors Info  Code Status, Allergies, Psychotropic Code Status Info: Full Code Allergies Info: No Known Allergies Psychotropic Info: donepezil (ARICEPT) tablet 5 mg daily at 10pm PO; traZODone (DESYREL) tablet 50 mg daily at bedtime PO; DULoxetine (CYMBALTA) DR capsule 30 mg daily PO         Current Medications (02/02/2018):  This is the current hospital active medication list Current Facility-Administered Medications  Medication Dose Route Frequency Provider Last Rate Last Dose  . acetaminophen (TYLENOL) tablet 325-650 mg  325-650 mg Oral Q6H PRN Meredith Pel, MD      . amLODipine (NORVASC) tablet 10 mg  10 mg Oral Daily Meredith Pel, MD   10 mg at 02/02/18 1050  . antiseptic oral rinse (BIOTENE) solution 10 mL  10 mL Mouth Rinse  QID Meredith Pel, MD   10 mL at 02/02/18 1054  . aspirin EC tablet 325 mg  325 mg Oral Daily Meredith Pel, MD   325 mg at 02/02/18 1050  . baclofen (LIORESAL) tablet 10 mg  10 mg Oral 3 times per day Meredith Pel, MD   10 mg at 02/02/18 1050  . budesonide (PULMICORT) nebulizer solution 0.5 mg  0.5 mg Nebulization BID Meredith Pel, MD   0.5 mg at 02/02/18 1051  . celecoxib (CELEBREX)  capsule 200 mg  200 mg Oral BID Meredith Pel, MD   200 mg at 02/02/18 1050  . cholecalciferol (VITAMIN D) tablet 1,000 Units  1,000 Units Oral Daily Meredith Pel, MD   1,000 Units at 02/02/18 1050  . cloNIDine (CATAPRES) tablet 0.1 mg  0.1 mg Oral BID Meredith Pel, MD   0.1 mg at 02/02/18 1050  . cyclobenzaprine (FLEXERIL) tablet 10 mg  10 mg Oral 2 times per day Meredith Pel, MD   10 mg at 02/02/18 1054  . docusate sodium (COLACE) capsule 100 mg  100 mg Oral BID Meredith Pel, MD   100 mg at 02/02/18 1051  . donepezil (ARICEPT) tablet 5 mg  5 mg Oral Q2200 Meredith Pel, MD   5 mg at 02/01/18 2044  . DULoxetine (CYMBALTA) DR capsule 30 mg  30 mg Oral Daily Meredith Pel, MD   30 mg at 02/02/18 1050  . fluticasone (FLONASE) 50 MCG/ACT nasal spray 1 spray  1 spray Each Nare BID Meredith Pel, MD   1 spray at 02/02/18 1055  . gabapentin (NEURONTIN) capsule 300 mg  300 mg Oral BID Meredith Pel, MD   300 mg at 02/02/18 1051  . HYDROmorphone (DILAUDID) injection 0.5-1 mg  0.5-1 mg Intravenous Q4H PRN Meredith Pel, MD      . ipratropium-albuterol (DUONEB) 0.5-2.5 (3) MG/3ML nebulizer solution 3 mL  3 mL Nebulization Q6H PRN Meredith Pel, MD      . lamoTRIgine (LAMICTAL) tablet 100 mg  100 mg Oral BID Meredith Pel, MD   100 mg at 02/02/18 1050  . levothyroxine (SYNTHROID, LEVOTHROID) tablet 88 mcg  88 mcg Oral QAC breakfast Meredith Pel, MD   88 mcg at 02/02/18 1051  . magnesium hydroxide (MILK OF MAGNESIA) suspension 30 mL  30 mL Oral Daily PRN Meredith Pel, MD      . menthol-cetylpyridinium (CEPACOL) lozenge 3 mg  1 lozenge Oral PRN Marlou Sa Tonna Corner, MD       Or  . phenol (CHLORASEPTIC) mouth spray 1 spray  1 spray Mouth/Throat PRN Meredith Pel, MD      . metoCLOPramide (REGLAN) tablet 5-10 mg  5-10 mg Oral Q8H PRN Meredith Pel, MD       Or  . metoCLOPramide (REGLAN) injection 5-10 mg  5-10 mg  Intravenous Q8H PRN Meredith Pel, MD      . ondansetron [facility]) tablet 4 mg  4 mg Oral Q6H PRN Meredith Pel, MD       Or  . ondansetron [facility]) injection 4 mg  4 mg Intravenous Q6H PRN Meredith Pel, MD      . oxyCODONE (Oxy IR/ROXICODONE) immediate release tablet 5-10 mg  5-10 mg Oral Q4H PRN Meredith Pel, MD   10 mg at 02/02/18 1048  . pantoprazole (PROTONIX) EC tablet 40 mg  40 mg Oral Daily Marlou Sa, Tonna Corner, MD  40 mg at 02/02/18 1050  . pravastatin (PRAVACHOL) tablet 10 mg  10 mg Oral QHS Meredith Pel, MD   10 mg at 02/01/18 2047  . pravastatin (PRAVACHOL) tablet 40 mg  40 mg Oral QHS Meredith Pel, MD   40 mg at 02/01/18 2043  . rOPINIRole (REQUIP) tablet 2 mg  2 mg Oral QHS Meredith Pel, MD   2 mg at 02/01/18 2321  . rOPINIRole (REQUIP) tablet 2 mg  2 mg Oral Q12H PRN Meredith Pel, MD   2 mg at 02/01/18 2043  . senna-docusate (Senokot-S) tablet 1 tablet  1 tablet Oral BID Meredith Pel, MD   1 tablet at 02/02/18 1050  . torsemide (DEMADEX) tablet 80 mg  80 mg Oral Daily Meredith Pel, MD   80 mg at 02/02/18 1050  . traZODone (DESYREL) tablet 50 mg  50 mg Oral QHS Meredith Pel, MD   50 mg at 02/01/18 2043     Discharge Medications: Please see discharge summary for a list of discharge medications.  Relevant Imaging Results:  Relevant Lab Results:   Additional Information SS# [place], [place]

## 2018-02-03 LAB — GLUCOSE, CAPILLARY
GLUCOSE-CAPILLARY: 110 mg/dL — AB (ref 65–99)
Glucose-Capillary: 106 mg/dL — ABNORMAL HIGH (ref 65–99)

## 2018-02-03 MED ORDER — DOCUSATE SODIUM 100 MG PO CAPS: 100.0000 mg | ORAL_CAPSULE | Freq: Two times a day (BID) | ORAL | 0 refills | Status: DC

## 2018-02-03 MED ORDER — OXYCODONE HCL 5 MG PO TABS: 5.0000 mg | ORAL_TABLET | ORAL | 0 refills | Status: DC | PRN

## 2018-02-03 NOTE — Progress Notes (Signed)
Subjective: Pt stable - pain ok after block worn off   Objective: Vital signs in last 24 hours: Temp:  [98.4 F (36.9 C)-99 F (37.2 C)] 98.7 F (37.1 C) (06/13 0502) Pulse Rate:  [62-69] 64 (06/13 0502) Resp:  [16-18] 16 (06/13 0502) BP: (112-135)/(45-51) 124/50 (06/13 0502) SpO2:  [91 %-100 %] 93 % (06/13 0938)  Intake/Output from previous day: 06/12 0701 - 06/13 0700 In: 240 [P.O.:240] Out: 5800 [Urine:5800] Intake/Output this shift: Total I/O In: 240 [P.O.:240] Out: 350 [Urine:350]  Exam:  No cellulitis present Compartment soft  Labs: No results for input(s): HGB in the last 72 hours. No results for input(s): WBC, RBC, HCT, PLT in the last 72 hours. No results for input(s): NA, K, CL, CO2, BUN, CREATININE, GLUCOSE, CALCIUM in the last 72 hours. No results for input(s): LABPT, INR in the last 72 hours.  Assessment/Plan: Plan dc today to snf rx on chart    G Alphonzo Severance 02/03/2018, 11:53 AM

## 2018-02-03 NOTE — Social Work (Signed)
Clinical Social Worker facilitated patient discharge including contacting patient family and facility to confirm patient discharge plans.  Clinical information faxed to facility and family agreeable with plan.  CSW arranged ambulance transport via [facility] to [facility] at 2:45pm. RN to call [phone] with report  prior to discharge.  Clinical Social Worker will sign off for now as social work intervention is no longer needed. Please consult [place] again if new need arises.  Alexander Mt, [place] Social Worker [phone]

## 2018-02-03 NOTE — Progress Notes (Signed)
Subjective: Patient stable.  Block has not yet worn off as of yesterday.  Foley remained in place due to diuresis ongoing.   Objective: Vital signs in last 24 hours: Temp:  [98.4 F (36.9 C)-99 F (37.2 C)] 98.7 F (37.1 C) (06/13 0502) Pulse Rate:  [62-69] 64 (06/13 0502) Resp:  [16-18] 16 (06/13 0502) BP: (112-135)/(45-51) 124/50 (06/13 0502) SpO2:  [91 %-100 %] 91 % (06/13 0502)  Intake/Output from previous day: 06/12 0701 - 06/13 0700 In: 240 [P.O.:240] Out: 5800 [Urine:5800] Intake/Output this shift: No intake/output data recorded.  Exam:  Intact pulses distally  Labs: No results for input(s): HGB in the last 72 hours. No results for input(s): WBC, RBC, HCT, PLT in the last 72 hours. No results for input(s): NA, K, CL, CO2, BUN, CREATININE, GLUCOSE, CALCIUM in the last 72 hours. No results for input(s): LABPT, INR in the last 72 hours.  Assessment/Plan: Plan is to reassess later today.  Possible discharge to skilled nursing either today or tomorrow   [name] 02/03/2018, 8:20 AM

## 2018-02-03 NOTE — Clinical Social Work Placement (Signed)
   CLINICAL SOCIAL WORK PLACEMENT  NOTE Nanine Means RN to call report to [phone]  Date:  02/03/2018  Patient Details  Name: [name] MRN: [id number] Date of Birth: 1945-09-12  Clinical Social Work is seeking post-discharge placement for this patient at the [place] level of care (*CSW will initial, date and re-position this form in  chart as items are completed):      Patient/family provided with [place] Work Department's list of facilities offering this level of care within the geographic area requested by the patient (or if unable, by the patient's family).  Yes   Patient/family informed of their freedom to choose among providers that offer the needed level of care, that participate in Medicare, Medicaid or managed care program needed by the patient, have an available bed and are willing to accept the patient.      Patient/family informed of [facility]'s ownership interest in [facility] and [facility], as well as of the fact that they are under no obligation to receive care at these facilities.  PASRR submitted to EDS on       PASRR number received on       Existing PASRR number confirmed on 02/03/18     FL2 transmitted to all facilities in geographic area requested by pt/family on 02/03/18     FL2 transmitted to all facilities within larger geographic area on       Patient informed that his/her managed care company has contracts with or will negotiate with certain facilities, including the following:        Yes   Patient/family informed of bed offers received.  Patient chooses bed at [facility]     Physician recommends and patient chooses bed at      Patient to be transferred to [facility] on 02/03/18.  Patient to be transferred to facility by [facility]     Patient family notified on 02/03/18 of transfer.  Name of family member notified:  pt states she will call daughter Vicente Males     PHYSICIAN        Additional Comment:    _______________________________________________ Alexander Mt, [place] 02/03/2018, 12:29 PM

## 2018-02-03 NOTE — Discharge Planning (Signed)
Patient discharged to SNF in stable condition.  

## 2018-02-03 NOTE — Discharge Summary (Signed)
Physician Discharge Summary  Patient ID: [name] MRN: [id number] DOB/AGE: [birth year] 72 y.o.  Admit date: 02/01/2018 Discharge date: 02/03/2018  Admission Diagnoses:  Active Problems:   Shoulder arthritis   Rotator cuff arthropathy of right shoulder   Discharge Diagnoses:  Same  Surgeries: Procedure(s): RIGHT REVERSE SHOULDER ARTHROPLASTY on 02/01/2018   Consultants:   Discharged Condition: Stable  Hospital Course: [name] is an 72 y.o. female who was admitted 02/01/2018 with a chief complaint of right shoulder pain, and found to have a diagnosis of right shoulder rotator cuff arthropathy.  They were brought to the operating room on 02/01/2018 and underwent the above named procedures.  Patient tolerated the procedure well.  She was mobilized with physical therapy and was also seen by occupational therapy for active assisted range of motion and passive range of motion of the right arm.  Patient is discharged to skilled nursing in good condition on postop day #2.  She is sent home on oxycodone for pain as well as her preoperative medications.  She will use shoulder CPM machine at her rehab facility.  Remove dressing in 10 days.  Follow-up with me in 2 weeks.  She is discharged in good condition.  I did order one set of thigh-high SCDs for her to wear while she is at her skilled nursing facility as she uses these at home but they are broken.  Antibiotics given:  Anti-infectives (From admission, onward)   Start     Dose/Rate Route Frequency Ordered Stop   02/01/18 1830  ceFAZolin (ANCEF) IVPB 2g/100 mL premix     2 g 200 mL/hr over 30 Minutes Intravenous Every 6 hours 02/01/18 1800 02/01/18 2351   02/01/18 1338  vancomycin (VANCOCIN) powder  Status:  Discontinued       As needed 02/01/18 1339 02/01/18 1445   02/01/18 1000  ceFAZolin (ANCEF) 3 g in dextrose 5 % 50 mL IVPB     3 g 100 mL/hr over 30 Minutes Intravenous To [facility] 01/31/18 1419 02/01/18 1135     .  Recent vital signs:  Vitals:   02/03/18 0502 02/03/18 0938  BP: (!) 124/50   Pulse: 64   Resp: 16   Temp: 98.7 F (37.1 C)   SpO2: 91% 93%    Recent laboratory studies:  Results for orders placed or performed during the hospital encounter of 02/01/18  Urine culture  Result Value Ref Range   Specimen Description URINE, CLEAN CATCH    Special Requests NONE    Culture (A)     <10,000 COLONIES/mL INSIGNIFICANT GROWTH Performed at [facility] [address], [place], [place] [address]    Report Status 02/02/2018 FINAL   Glucose, capillary  Result Value Ref Range   Glucose-Capillary 93 65 - 99 mg/dL  Urinalysis, Routine w reflex microscopic  Result Value Ref Range   Color, Urine STRAW (A) YELLOW   APPearance CLEAR CLEAR   Specific Gravity, Urine 1.008 1.005 - 1.030   pH 6.0 5.0 - 8.0   Glucose, UA NEGATIVE NEGATIVE mg/dL   Hgb urine dipstick NEGATIVE NEGATIVE   Bilirubin Urine NEGATIVE NEGATIVE   Ketones, ur NEGATIVE NEGATIVE mg/dL   Protein, ur NEGATIVE NEGATIVE mg/dL   Nitrite NEGATIVE NEGATIVE   Leukocytes, UA NEGATIVE NEGATIVE  Glucose, capillary  Result Value Ref Range   Glucose-Capillary 134 (H) 65 - 99 mg/dL   Comment 1 Notify RN    Comment 2 Document in Chart   Glucose, capillary  Result Value Ref Range   Glucose-Capillary 119 (H) 65 - 99 mg/dL  Glucose, capillary  Result Value Ref Range   Glucose-Capillary 151 (H) 65 - 99 mg/dL  Glucose, capillary  Result Value Ref Range   Glucose-Capillary 144 (H) 65 - 99 mg/dL  Glucose, capillary  Result Value Ref Range   Glucose-Capillary 106 (H) 65 - 99 mg/dL  Glucose, capillary  Result Value Ref Range   Glucose-Capillary 110 (H) 65 - 99 mg/dL    Discharge Medications:   Allergies as of 02/03/2018   No Known Allergies     Medication List    STOP taking these medications   oxyCODONE-acetaminophen 7.5-325 MG tablet Commonly known as:  PERCOCET     TAKE these medications   acetaminophen 650 MG  CR tablet Commonly known as:  TYLENOL Take 650 mg by mouth every 4 (four) hours as needed for pain.   amLODipine 10 MG tablet Commonly known as:  NORVASC Take 1 tablet (10 mg total) by mouth daily. What changed:  additional instructions   antiseptic oral rinse Liqd 10 mLs by Mouth Rinse route 4 (four) times daily. (0900, 1000, 1200 & 2100)   aspirin EC 81 MG tablet Take 1 tablet (81 mg total) by mouth daily. What changed:  additional instructions   baclofen 10 MG tablet Commonly known as:  LIORESAL Take 10 mg by mouth 3 (three) times daily. (0900, 1400, & 2100)   budesonide 0.5 MG/2ML nebulizer solution Commonly known as:  PULMICORT Take 0.5 mg by nebulization 2 (two) times daily. (1000 & 2200)   cloNIDine 0.1 MG tablet Commonly known as:  CATAPRES Take 0.1 mg by mouth 2 (two) times daily. (0900 & 2100)   cyclobenzaprine 5 MG tablet Commonly known as:  FLEXERIL Take 10 mg by mouth 2 (two) times daily. (0800 & 2100)   docusate sodium 100 MG capsule Commonly known as:  COLACE Take 1 capsule (100 mg total) by mouth 2 (two) times daily.   donepezil 5 MG tablet Commonly known as:  ARICEPT Take 5 mg by mouth daily at 10 pm. (2100)   DULoxetine 30 MG capsule Commonly known as:  CYMBALTA Take 30 mg by mouth daily. (0900)   fluticasone 50 MCG/ACT nasal spray Commonly known as:  FLONASE Place 1 spray into the nose 2 (two) times daily. (0900 & 2100)   gabapentin 300 MG capsule Commonly known as:  NEURONTIN Take 300 mg by mouth 2 (two) times daily. (0900 & 2100)   ipratropium-albuterol 0.5-2.5 (3) MG/3ML Soln Commonly known as:  DUONEB Take 3 mLs by nebulization every 6 (six) hours as needed (FOR WHEEZING/SHORTNESS OF BREATH).   lamoTRIgine 100 MG tablet Commonly known as:  LAMICTAL Take 100 mg by mouth 2 (two) times daily. (0900 & 2100)   levothyroxine 88 MCG tablet Commonly known as:  SYNTHROID, LEVOTHROID Take 88 mcg by mouth daily before breakfast. (0900)    magnesium hydroxide 400 MG/5ML suspension Commonly known as:  MILK OF MAGNESIA Take 30 mLs by mouth daily as needed (FOR CONSTIPATION).   Melatonin 3 MG Caps Take 3 mg by mouth at bedtime. (2100)   omeprazole 20 MG capsule Commonly known as:  PRILOSEC Take 20 mg by mouth 2 (two) times daily before a meal. (0900 & 1700)   oxyCODONE 5 MG immediate release tablet Commonly known as:  Oxy IR/ROXICODONE Take 1-2 tablets (5-10 mg total) by mouth every 4 (four) hours as needed for moderate pain (pain score 4-6).   pravastatin 40  MG tablet Commonly known as:  PRAVACHOL Take 40 mg by mouth daily. (2200)   pravastatin 10 MG tablet Commonly known as:  PRAVACHOL Take 10 mg by mouth daily. (2200)   rOPINIRole 1 MG tablet Commonly known as:  REQUIP Take 2 mg by mouth at bedtime. (2100)   rOPINIRole 2 MG tablet Commonly known as:  REQUIP Take 2 mg by mouth every 12 (twelve) hours as needed (for restless leg).   sennosides-docusate sodium 8.6-50 MG tablet Commonly known as:  SENOKOT-S Take 1 tablet by mouth 2 (two) times daily. (0900 & 2100)   torsemide 20 MG tablet Commonly known as:  DEMADEX Take 4 tablets (80 mg total) by mouth daily. What changed:  additional instructions   traZODone 50 MG tablet Commonly known as:  DESYREL Take 50 mg by mouth at bedtime. (2100)   VITAMIN B-12 IJ Inject 1,000 mcg as directed every 30 (thirty) days. Weekly for 4 weeks, has 1 more dose this week (week of 7/2) then will switch to once a month   Vitamin D3 1000 units Caps Take 1,000 Units by mouth daily. (0900)       Diagnostic Studies: Ct Shoulder Right Wo Contrast  Result Date: 01/10/2018 CLINICAL DATA:  Preoperative examination for patient with chronic right shoulder pain. COS protocol. EXAM: CT OF THE UPPER RIGHT EXTREMITY WITHOUT CONTRAST TECHNIQUE: Multidetector CT imaging of the upper right extremity was performed according to the standard protocol. COMPARISON:  Plain films right  shoulder 04/19/2017 FINDINGS: Bones/Joint/Cartilage There is an area of flattening of the posterior, superior humeral head measuring 2.4 cm AP by 2.3 cm craniocaudal likely due to avascular necrosis. Small osteophyte is seen off the humeral head. Tiny subchondral cyst in the glenoid is noted. There is glenohumeral joint space narrowing. The humeral head is high-riding and abuts the undersurface of the acromion. The acromioclavicular joint has been resected. Type 2 acromion is noted. No fracture is identified. No lytic or sclerotic lesion is seen. The patient has a glenohumeral joint effusion with multiple calcifications present within it. There is also fluid in the subacromial/subdeltoid bursa. Ligaments Suboptimally assessed by CT. Muscles and Tendons Mild to moderate fatty atrophy of the supraspinatus, infraspinatus and subscapularis appears worst in the supraspinatus. The supraspinatus tendon appears completely torn and retracted the level the glenohumeral joint, 4-5 cm. The infraspinatus is also likely completely torn. There may be some intact fibers of the subscapularis. Soft tissues Imaged lung parenchyma is clear.  No pleural effusion. 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 Moderate appearing glenohumeral osteoarthritis. The patient has a large glenohumeral joint effusion with multiple calcifications within the joint which may represent bone fragments from the humeral head and/or chondrocalcinosis. The patient has extensive rotator cuff tearing with associated mild to moderate fatty atrophy of the rotator cuff muscles, worst in the supraspinatus. Status post resection of the [facility] joint without evidence of complication. Electronically Signed   By: Inge Rise M.D.   On: 01/10/2018 14:55   Dg Shoulder Right Port  Result Date: 02/01/2018 CLINICAL DATA:  Postop shoulder arthroplasty EXAM: PORTABLE RIGHT SHOULDER COMPARISON:   None. FINDINGS: Interval right total reverse shoulder arthroplasty without hardware failure or complication. Normal alignment. No acute fracture or dislocation. IMPRESSION: Interval right total reverse shoulder arthroplasty. Electronically Signed   By: Kathreen Devoid   On: 02/01/2018 15:36    Disposition: Discharge disposition: 03-Skilled [place]       Discharge  Instructions    Call MD / Call 911   Complete by:  As directed    If you experience chest pain or shortness of breath, CALL 911 and be transported to the hospital emergency room.  If you develope a fever above 101 F, pus ([place] drainage) or increased drainage or redness at the wound, or calf pain, call your surgeon's office.   Constipation Prevention   Complete by:  As directed    Drink plenty of fluids.  Prune juice may be helpful.  You may use a stool softener, such as Colace (over the counter) 100 mg twice a day.  Use MiraLax (over the counter) for constipation as needed.   Diet - low sodium heart healthy   Complete by:  As directed    Discharge instructions   Complete by:  As directed    [place] for aarom and prom right arm Remove dressing in 10 days Use sling for comfort for first 10 days then dc sling F/u in 2 weeks   Increase activity slowly as tolerated   Complete by:  As directed       Contact information for after-discharge care    Destination    [facility] .   Service:  Skilled Nursing Contact information: [place] [phone]               Signed: Anderson Malta 02/03/2018, 12:07 PM

## 2018-02-03 NOTE — Social Work (Addendum)
Per SNF admissions staff pt will return under [employer] benefit. Checking to see if this would require a 3 night stay or if pt is able to discharge today. Will update if discharge possible today.   11:16am- Pt able to discharge to [facility] today if medically appropriate. MD notified.   CSW continuing to follow.   Alexander Mt, [place] Work [phone]

## 2018-02-03 NOTE — Clinical Social Work Note (Signed)
Clinical Social Work Assessment  Patient Details  Name: [name] MRN: [id number] Date of Birth: 1945/10/24  Date of referral:  02/03/18               Reason for consult:  Facility Placement, Discharge Planning                Permission sought to share information with:  Facility Sport and exercise psychologist, Family Supports Permission granted to share information::  Yes, Verbal Permission Granted  Name::     Clint Bolder   Agency::  [employer]  Relationship::  daughter  Sport and exercise psychologist Information:     Housing/Transportation Living arrangements for the past 2 months:  [place], [place] of Information:  Patient Patient Interpreter Needed:  None Criminal Activity/Legal Involvement Pertinent to Current Situation/Hospitalization:  No - Comment as needed Significant Relationships:  Adult Children, Warehouse manager, Other Family Members Lives with:  Facility Resident Do you feel safe going back to the place where you live?  Yes Need for family participation in patient care:  Yes (Comment)  Care giving concerns:  Pt has been in SNF placement last year and then returned home with daughter per chart. At home pt had several falls and required more supervision and assistance than could be provided at home. Pt transitioned to [facility] where she has been living recently. Pt just had shoulder replacement surgery and is ready to return.   Social Worker assessment / plan:  CSW met with pt at bedside, pt aware that she is discharging back to [facility]. Pt states that she has been living there most recently and is happy with her placement there. Pt states she has three children who come and visit frequently and bring her joy. Pt doing well after shoulder replacement surgery and acknowledges that she will return to get more therapies and follow up with MD.   Pt states that she would prefer to return in her daughters car but knows that her daughter would be more  comfortable if she went via PTAR. CSW to arrange PTAR and follow up to let SNF know of pt return today.   Employment status:  Retired Forensic scientist:  Information systems manager, Medicaid In [employer] PT Recommendations:  [place], [place] / Referral to community resources:  [place]  Patient/Family's Response to care:  Pt very pleasant and amenable to speaking with CSW, acknowledges her return to [facility], and has no further questions.   Patient/Family's Understanding of and Emotional Response to Diagnosis, Current Treatment, and Prognosis: Pt states understanding of diagnosis, current treatment, and prognosis. Pt seems to have a good grasp on her limitations and abilities, and acknowledges need for therapy supports. Pt shared positive stories of her visits with her children when they come to SNF to see her. Pt smiled throughout visit and was emotionally positive regarding her discharge.   Emotional Assessment Appearance:  Appears stated age Attitude/Demeanor/Rapport:  Charismatic, Engaged, Gracious Affect (typically observed):  Accepting, Adaptable, Appropriate, Pleasant, Hopeful Orientation:  Oriented to Self, Oriented to Place, Oriented to  Time, Oriented to Situation Alcohol / Substance use:  Not Applicable Psych involvement (Current and /or in the community):  No (Comment)  Discharge Needs  Concerns to be addressed:  Discharge Planning Concerns, Care Coordination Readmission within the last 30 days:  Yes Current discharge risk:  Physical Impairment Barriers to Discharge:  Barriers Resolved   [place] 02/03/2018, 12:37 PM

## 2018-02-04 ENCOUNTER — Telehealth (INDEPENDENT_AMBULATORY_CARE_PROVIDER_SITE_OTHER): Payer: Self-pay

## 2018-02-04 ENCOUNTER — Telehealth (INDEPENDENT_AMBULATORY_CARE_PROVIDER_SITE_OTHER): Payer: Self-pay | Admitting: Orthopedic Surgery

## 2018-02-04 NOTE — Telephone Encounter (Signed)
Talked with Larene Beach at [facility] and advised her to try the calf high sequential compression devices per Dr. Marlou Sa.  Stated that she will try and call some places again and they do not have them, then she will call back on Monday.

## 2018-02-04 NOTE — Telephone Encounter (Signed)
Larene Beach called from [facility] asking for the order for the sequential compression device and also the CPM machine that is suppose to be given to the patient at [place] # [phone]

## 2018-02-04 NOTE — Telephone Encounter (Signed)
I faxed orders to [facility] [phone]

## 2018-02-07 ENCOUNTER — Telehealth (INDEPENDENT_AMBULATORY_CARE_PROVIDER_SITE_OTHER): Payer: Self-pay | Admitting: Orthopedic Surgery

## 2018-02-07 ENCOUNTER — Encounter (HOSPITAL_COMMUNITY): Payer: Self-pay | Admitting: Orthopedic Surgery

## 2018-02-07 NOTE — Telephone Encounter (Signed)
Try for whatever she had before that doesn't work now thx

## 2018-02-07 NOTE — Telephone Encounter (Signed)
Please advise. Thanks.  

## 2018-02-07 NOTE — Telephone Encounter (Signed)
Larene Beach from [facility] called wanting to let Dr. Marlou Sa know she has called a half dozen medical supply stores trying to find the sequential compression device and is having a hard time locating one that rents them to facilities. If you could give her a call back and let her know what Dr. Marlou Sa would like to do instead. Thank you. CB # [phone]

## 2018-02-08 NOTE — Telephone Encounter (Signed)
Tried calling back, was put on hold, Larene Beach never picked up. I called back and LM with Collie Siad (receptionist who answered) to have [facility] call me back to discuss.

## 2018-02-16 ENCOUNTER — Telehealth (INDEPENDENT_AMBULATORY_CARE_PROVIDER_SITE_OTHER): Payer: Self-pay | Admitting: Orthopedic Surgery

## 2018-02-16 ENCOUNTER — Ambulatory Visit (INDEPENDENT_AMBULATORY_CARE_PROVIDER_SITE_OTHER): Payer: No Typology Code available for payment source | Admitting: Orthopedic Surgery

## 2018-02-16 ENCOUNTER — Encounter (INDEPENDENT_AMBULATORY_CARE_PROVIDER_SITE_OTHER): Payer: Self-pay | Admitting: Orthopedic Surgery

## 2018-02-16 ENCOUNTER — Ambulatory Visit (INDEPENDENT_AMBULATORY_CARE_PROVIDER_SITE_OTHER): Payer: No Typology Code available for payment source

## 2018-02-16 DIAGNOSIS — Z96611 Presence of right artificial shoulder joint: Secondary | ICD-10-CM

## 2018-02-16 NOTE — Progress Notes (Signed)
Post-Op Visit Note   Patient: [name]           Date of Birth: 09/25/1945           MRN: [id number] Visit Date: 02/16/2018 PCP: Hilbert Corrigan, MD   Assessment & Plan:  Chief Complaint:  Chief Complaint  Patient presents with  . Right Shoulder - Routine Post Op   Visit Diagnoses:  1. Status post reverse total replacement of right shoulder     Plan: [name] is a patient who is now 2 weeks out right reverse shoulder replacement.  On examination deltoid fires.  She has pretty reasonable passive range of motion.  Motor or sensory function to the hand is intact.  Strength is improving.  Plan at this time is to continue CPM machine which currently is at 90 degrees.  Continue with therapy to work on strengthening.  4-week return for clinical recheck.  Follow-Up Instructions: Return in about 1 month (around 03/16/2018).   Orders:  Orders Placed This Encounter  Procedures  . XR Shoulder Right   No orders of the defined types were placed in this encounter.   Imaging: Xr Shoulder Right  Result Date: 02/16/2018 AP outlet axillary right shoulder reviewed.  Reverse shoulder replacement in good position and alignment.  No complicating features.  No dislocation.   PMFS History: Patient Active Problem List   Diagnosis Date Noted  . Rotator cuff arthropathy of right shoulder   . Shoulder arthritis 06/15/2017  . Status post revision of total knee replacement, left 04/29/2017  . Polyethylene liner wear following left total knee arthroplasty requiring isolated polyethylene liner exchange ([place]) 03/02/2017  . 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 Unstable angina ([place]) 05/21/2016  . Chronic diastolic heart failure ([place]) 03/29/2016  . Normal coronary arteries 2009 01/14/2015  . Obesity-BMI 40 01/14/2015  . Restless leg 01/14/2015  . Chest pain 01/14/2015  . Back pain 01/14/2015  . Renal insufficiency 01/14/2015  . Dementia- mild memory  issues 01/14/2015  . Occult blood positive stool 12/14/2014  . Anemia 12/14/2014  . Family history of colon cancer 12/14/2014  . Change in bowel habits 12/14/2014  . GERD (gastroesophageal reflux disease) 12/14/2014  . Dyspnea 05/17/2012  . Lymphedema 05/17/2012  . Weight gain 05/17/2012  . HTN (hypertension) 05/17/2012   Past Medical History:  Diagnosis Date  . Anemia   . Anxiety   . Arthritis    "hands, feet" (03/03/2017)  . Brain lesion    2 types  . Cervical spondylosis with myelopathy   . Chest pain    Normal cardiac cath 5/09  . Chronic lower back pain   . CKD (chronic kidney disease), stage III ([place])    [name]  . Congestive heart failure (CHF) ([place])    has a Cardiomems implant   . Constipation   . Dementia   . Depression   . Dumping syndrome   . Dyspnea    with activity  . Edema   . Fatigue   . Fibromyalgia   . GERD (gastroesophageal reflux disease)   . Headache    "w/high CBG" (03/03/2017)  . History of echocardiogram    a. Echo 03/20/16 (done at [facility] in [place], [place]):  mild LVH, EF 42%, normal diastolic function, mild LAE, MAC, RVSP 25 mmHg  . Hyperlipidemia   . Hypertension   . Hypothyroidism   . Lumbar spondylosis   . Lymphedema    seeing specialist  for this  . Migraine    "mostly stopped when I changed my diet" (03/03/2017)  . OSA on CPAP   . Pneumonia X 1  . PONV (postoperative nausea and vomiting)   . Restless leg syndrome   . Rheumatoid arthritis ([place])   . Stroke [facility])    TIAs "mini strokes"- unsure of last TIA - pt and daughter deny this  . Syncope   . Tremors of nervous system   . Type II diabetes mellitus (HCC)     Family History  Problem Relation Age of Onset  . Heart disease Mother   . Heart failure Mother   . Kidney disease Mother   . Lung cancer Father   . Colon cancer Brother   . Cancer Brother        malignant thymoma  . Heart disease Brother   . Tremor Maternal Grandmother 90  . Ovarian cancer  Maternal Grandmother   . Uterine cancer Maternal Grandmother   . Irritable bowel syndrome Daughter   . Esophageal cancer Neg Hx   . Stomach cancer Neg Hx   . Rectal cancer Neg Hx     Past Surgical History:  Procedure Laterality Date  . APPENDECTOMY  1966  . BACK SURGERY    . CARDIAC CATHETERIZATION N/A 05/25/2016   Procedure: Right/Left Heart Cath and Coronary Angiography;  Surgeon: Larey Dresser, MD;  Location: [place] [facility];  Service: Cardiovascular;  Laterality: N/A;  . CATARACT EXTRACTION W/ INTRAOCULAR LENS  IMPLANT, BILATERAL Bilateral   . COLONOSCOPY    . CORONARY ANGIOGRAM  2009   Normal coronaries  . EXCISION/RELEASE BURSA HIP Bilateral   . I&D KNEE WITH POLY EXCHANGE Left 03/02/2017   Procedure: Left knee Revision of poly liner;  Surgeon: Mcarthur Rossetti, MD;  Location: [place];  Service: Orthopedics;  Laterality: Left;  . JOINT REPLACEMENT    . KNEE ARTHROSCOPY Bilateral   . LAPAROSCOPIC CHOLECYSTECTOMY  2015  . LUMBAR DISC SURGERY    . LUMBAR LAMINECTOMY/DECOMPRESSION MICRODISCECTOMY Left 12/2005   L2-3 laminectomy and diskectomy/notes 01/06/2011  . POSTERIOR LUMBAR FUSION  04/2006   [name] 01/06/2011; "put cages in"  . REVERSE SHOULDER ARTHROPLASTY Left 06/15/2017   Procedure: REVERSE LEFT SHOULDER ARTHROPLASTY;  Surgeon: Meredith Pel, MD;  Location: [place];  Service: Orthopedics;  Laterality: Left;  . REVERSE SHOULDER ARTHROPLASTY Right 02/01/2018  . REVERSE SHOULDER ARTHROPLASTY Right 02/01/2018   Procedure: RIGHT REVERSE SHOULDER ARTHROPLASTY;  Surgeon: Meredith Pel, MD;  Location: [place];  Service: Orthopedics;  Laterality: Right;  . REVISION TOTAL KNEE ARTHROPLASTY Left 03/02/2017   poly liner/notes 03/02/2017  . RIGHT HEART CATH N/A 11/13/2016   Procedure: Right Heart Cath with Cardiomems;  Surgeon: Larey Dresser, MD;  Location: [place] [facility];  Service: Cardiovascular;  Laterality: N/A;  . SHOULDER ARTHROSCOPY WITH ROTATOR CUFF REPAIR  Right   . SHOULDER OPEN ROTATOR CUFF REPAIR Left 01/2011   [name] 01/29/2011  . TOTAL ABDOMINAL HYSTERECTOMY    . TOTAL KNEE ARTHROPLASTY Left   . TOTAL KNEE ARTHROPLASTY Right 11/18/2012   Procedure: TOTAL KNEE ARTHROPLASTY;  Surgeon: Yvette Rack., MD;  Location: [place];  Service: Orthopedics;  Laterality: Right;  . TUBAL LIGATION    . UPPER GI ENDOSCOPY     Social History   Occupational History  . Occupation: retired  Tobacco Use  . Smoking status: Never Smoker  . Smokeless tobacco: Never Used  Substance and Sexual Activity  . Alcohol use:  No    Alcohol/week: 0.0 oz  . Drug use: No  . Sexual activity: Not Currently

## 2018-02-16 NOTE — Telephone Encounter (Signed)
IC advised per Dr Marlou Sa

## 2018-02-16 NOTE — Telephone Encounter (Signed)
I am not a good one to prescribe Requip.  In regards to the arm CPM machine I would use it for 2 more weeks

## 2018-02-16 NOTE — Telephone Encounter (Signed)
Please review note and advise.

## 2018-02-16 NOTE — Telephone Encounter (Signed)
Patient is asking for a dosage increase of requip for restless legs. She said she is just taking 2 mg at bed time or every 12 hours. She would like this faxed to [facility] if possible, their fax # [phone] [place] Sol[place]. Also, she had a question about how long she will need to use the chair that operates her arm? Patient asks that a note/paper be faxed with the rx to let her know. She doesn't know of a good phone number.

## 2018-02-17 ENCOUNTER — Telehealth (INDEPENDENT_AMBULATORY_CARE_PROVIDER_SITE_OTHER): Payer: Self-pay | Admitting: Orthopedic Surgery

## 2018-02-17 NOTE — Telephone Encounter (Signed)
IC s/w Ryan and advised that per consult note that Dr Marlou Sa completed and sent back with patient to facility That patient should continue PT [facility] for 1/2 to 1lbs weight for strengthening in PT Ok to [facility] on right shoulder with walker

## 2018-02-17 NOTE — Telephone Encounter (Signed)
Thurmond Butts, OT with [facility] in [place] is wanting clarification on orders since there were no therapy notes sent. He would like to know the next steps for this patient. [place] # [phone]

## 2018-03-21 ENCOUNTER — Telehealth (INDEPENDENT_AMBULATORY_CARE_PROVIDER_SITE_OTHER): Payer: Self-pay | Admitting: Radiology

## 2018-03-21 NOTE — Telephone Encounter (Signed)
[name] called to verify when to d/c CPM.  I advised can d/c as of 03/02/18 per chart note.  She also questions f/u appt, it is sched for 6 months. It needed to be 4 weeks.  No appt available until 6 weeks out from last, so I sched for her.  Patient will f/u on 04/06/18 with Dr Marlou Sa.  [name] will call us with any questions.

## 2018-04-06 ENCOUNTER — Ambulatory Visit (INDEPENDENT_AMBULATORY_CARE_PROVIDER_SITE_OTHER): Payer: Medicare Other | Admitting: Orthopedic Surgery

## 2018-04-06 ENCOUNTER — Encounter (INDEPENDENT_AMBULATORY_CARE_PROVIDER_SITE_OTHER): Payer: Self-pay | Admitting: Orthopedic Surgery

## 2018-04-06 DIAGNOSIS — Z96611 Presence of right artificial shoulder joint: Secondary | ICD-10-CM

## 2018-04-09 IMAGING — NM NM MISC PROCEDURE
3 series · 18 of 18 positions shown · non-contrast
Comparison: none

[Series 1: rest_(id)_sa · 6.4mm · 6.40mm/px · 6 of 64 frames shown]
[frame 6/64]
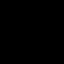
[frame 16/64]
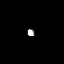
[frame 27/64]
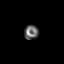
[frame 38/64]
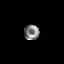
[frame 48/64]
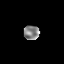
[frame 59/64]
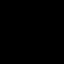

[Series 1: stress-gsp_(id)_sa · 6.4mm · 6.40mm/px · 6 of 512 frames shown]
[frame 43/512]
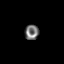
[frame 128/512]
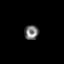
[frame 214/512]
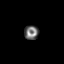
[frame 299/512]
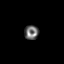
[frame 384/512]
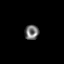
[frame 470/512]
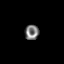

[Series 1: stress-sum-em_(id)_sa · 6.4mm · 6.40mm/px · 6 of 64 frames shown]
[frame 6/64]
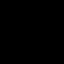
[frame 16/64]
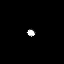
[frame 27/64]
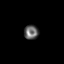
[frame 38/64]
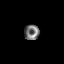
[frame 48/64]
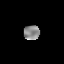
[frame 59/64]
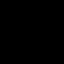

[18 of 18 positions shown; findings below may reference images not displayed]

Canned report from images found in remote index.

Refer to host system for actual result text.

## 2018-04-10 ENCOUNTER — Encounter (INDEPENDENT_AMBULATORY_CARE_PROVIDER_SITE_OTHER): Payer: Self-pay | Admitting: Orthopedic Surgery

## 2018-04-10 NOTE — Progress Notes (Signed)
Post-Op Visit Note   Patient: [name]           Date of Birth: Jan 23, 1946           MRN: [id number] Visit Date: 04/06/2018 PCP: Hilbert Corrigan, MD   Assessment & Plan:  Chief Complaint:  Chief Complaint  Patient presents with  . Right Shoulder - Routine Post Op   Visit Diagnoses:  1. Status post reverse total replacement of right shoulder     Plan: [name] is a patient with reverse shoulder replacement on the right.  This was done 02/01/2018.  She is making progress with physical therapy at the facility where she is living.  She has a CPM but it is up to 120 degrees.  On examination she has good passive range of motion.  Deltoid is functional.  She is moving well with a walker.  Plan at this time is to continue with shoulder rehabilitation exercises and come back in 3 months for clinical recheck and release.  Follow-Up Instructions: Return in about 12 weeks (around 06/29/2018).   Orders:  No orders of the defined types were placed in this encounter.  No orders of the defined types were placed in this encounter.   Imaging: No results found.  PMFS History: Patient Active Problem List   Diagnosis Date Noted  . Rotator cuff arthropathy of right shoulder   . Shoulder arthritis 06/15/2017  . Status post revision of total knee replacement, left 04/29/2017  . Polyethylene liner wear following left total knee arthroplasty requiring isolated polyethylene liner exchange ([place]) 03/02/2017  . 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 Unstable angina ([place]) 05/21/2016  . Chronic diastolic heart failure ([place]) 03/29/2016  . Normal coronary arteries 2009 01/14/2015  . Obesity-BMI 40 01/14/2015  . Restless leg 01/14/2015  . Chest pain 01/14/2015  . Back pain 01/14/2015  . Renal insufficiency 01/14/2015  . Dementia- mild memory issues 01/14/2015  . Occult blood positive stool 12/14/2014  . Anemia 12/14/2014  . Family history of colon cancer  12/14/2014  . Change in bowel habits 12/14/2014  . GERD (gastroesophageal reflux disease) 12/14/2014  . Dyspnea 05/17/2012  . Lymphedema 05/17/2012  . Weight gain 05/17/2012  . HTN (hypertension) 05/17/2012   Past Medical History:  Diagnosis Date  . Anemia   . Anxiety   . Arthritis    "hands, feet" (03/03/2017)  . Brain lesion    2 types  . Cervical spondylosis with myelopathy   . Chest pain    Normal cardiac cath 5/09  . Chronic lower back pain   . CKD (chronic kidney disease), stage III ([place])    [name]  . Congestive heart failure (CHF) ([place])    has a Cardiomems implant   . Constipation   . Dementia   . Depression   . Dumping syndrome   . Dyspnea    with activity  . Edema   . Fatigue   . Fibromyalgia   . GERD (gastroesophageal reflux disease)   . Headache    "w/high CBG" (03/03/2017)  . History of echocardiogram    a. Echo 03/20/16 (done at [facility] in [place], [place]):  mild LVH, EF 35%, normal diastolic function, mild LAE, MAC, RVSP 25 mmHg  . Hyperlipidemia   . Hypertension   . Hypothyroidism   . Lumbar spondylosis   . Lymphedema    seeing specialist for this  . Migraine    "mostly stopped when I  changed my diet" (03/03/2017)  . OSA on CPAP   . Pneumonia X 1  . PONV (postoperative nausea and vomiting)   . Restless leg syndrome   . Rheumatoid arthritis ([place])   . Stroke [facility])    TIAs "mini strokes"- unsure of last TIA - pt and daughter deny this  . Syncope   . Tremors of nervous system   . Type II diabetes mellitus (HCC)     Family History  Problem Relation Age of Onset  . Heart disease Mother   . Heart failure Mother   . Kidney disease Mother   . Lung cancer Father   . Colon cancer Brother   . Cancer Brother        malignant thymoma  . Heart disease Brother   . Tremor Maternal Grandmother 90  . Ovarian cancer Maternal Grandmother   . Uterine cancer Maternal Grandmother   . Irritable bowel syndrome Daughter   . Esophageal  cancer Neg Hx   . Stomach cancer Neg Hx   . Rectal cancer Neg Hx     Past Surgical History:  Procedure Laterality Date  . APPENDECTOMY  1966  . BACK SURGERY    . CARDIAC CATHETERIZATION N/A 05/25/2016   Procedure: Right/Left Heart Cath and Coronary Angiography;  Surgeon: Larey Dresser, MD;  Location: [place] [facility];  Service: Cardiovascular;  Laterality: N/A;  . CATARACT EXTRACTION W/ INTRAOCULAR LENS  IMPLANT, BILATERAL Bilateral   . COLONOSCOPY    . CORONARY ANGIOGRAM  2009   Normal coronaries  . EXCISION/RELEASE BURSA HIP Bilateral   . I&D KNEE WITH POLY EXCHANGE Left 03/02/2017   Procedure: Left knee Revision of poly liner;  Surgeon: Mcarthur Rossetti, MD;  Location: [place];  Service: Orthopedics;  Laterality: Left;  . JOINT REPLACEMENT    . KNEE ARTHROSCOPY Bilateral   . LAPAROSCOPIC CHOLECYSTECTOMY  2015  . LUMBAR DISC SURGERY    . LUMBAR LAMINECTOMY/DECOMPRESSION MICRODISCECTOMY Left 12/2005   L2-3 laminectomy and diskectomy/notes 01/06/2011  . POSTERIOR LUMBAR FUSION  04/2006   [name] 01/06/2011; "put cages in"  . REVERSE SHOULDER ARTHROPLASTY Left 06/15/2017   Procedure: REVERSE LEFT SHOULDER ARTHROPLASTY;  Surgeon: Meredith Pel, MD;  Location: [place];  Service: Orthopedics;  Laterality: Left;  . REVERSE SHOULDER ARTHROPLASTY Right 02/01/2018  . REVERSE SHOULDER ARTHROPLASTY Right 02/01/2018   Procedure: RIGHT REVERSE SHOULDER ARTHROPLASTY;  Surgeon: Meredith Pel, MD;  Location: [place];  Service: Orthopedics;  Laterality: Right;  . REVISION TOTAL KNEE ARTHROPLASTY Left 03/02/2017   poly liner/notes 03/02/2017  . RIGHT HEART CATH N/A 11/13/2016   Procedure: Right Heart Cath with Cardiomems;  Surgeon: Larey Dresser, MD;  Location: [place] [facility];  Service: Cardiovascular;  Laterality: N/A;  . SHOULDER ARTHROSCOPY WITH ROTATOR CUFF REPAIR Right   . SHOULDER OPEN ROTATOR CUFF REPAIR Left 01/2011   [name] 01/29/2011  . TOTAL ABDOMINAL HYSTERECTOMY    .  TOTAL KNEE ARTHROPLASTY Left   . TOTAL KNEE ARTHROPLASTY Right 11/18/2012   Procedure: TOTAL KNEE ARTHROPLASTY;  Surgeon: Yvette Rack., MD;  Location: [place];  Service: Orthopedics;  Laterality: Right;  . TUBAL LIGATION    . UPPER GI ENDOSCOPY     Social History   Occupational History  . Occupation: retired  Tobacco Use  . Smoking status: Never Smoker  . Smokeless tobacco: Never Used  Substance and Sexual Activity  . Alcohol use: No    Alcohol/week: 0.0 standard drinks  . Drug use:  No  . Sexual activity: Not Currently

## 2018-04-13 ENCOUNTER — Telehealth (INDEPENDENT_AMBULATORY_CARE_PROVIDER_SITE_OTHER): Payer: Self-pay | Admitting: Orthopedic Surgery

## 2018-04-13 NOTE — Telephone Encounter (Signed)
done

## 2018-04-13 NOTE — Telephone Encounter (Signed)
Can you please write order so I can fax?

## 2018-04-13 NOTE — Telephone Encounter (Signed)
Patient called advised she is only having (PT) on her legs only. Patient said she also need  a (PT) order for her right shoulder therapy. Patient said she is at [facility]. The number to contact patient is [phone]

## 2018-04-13 NOTE — Telephone Encounter (Signed)
Clymer was given fax number of [phone] to fax order. I faxed order. Tried calling patient to advise done, no answer, VM not set up.

## 2018-04-18 ENCOUNTER — Telehealth (INDEPENDENT_AMBULATORY_CARE_PROVIDER_SITE_OTHER): Payer: Self-pay | Admitting: Orthopedic Surgery

## 2018-04-18 NOTE — Telephone Encounter (Signed)
Patient called left voicemail message with another fax# to fax order. The fax# is [phone].

## 2018-04-18 NOTE — Telephone Encounter (Signed)
Patient called advised order for (PT) on her right shoulder was not received. Patient said she checked with the nurse yesterday. The number to contact patient is [phone]

## 2018-04-18 NOTE — Telephone Encounter (Signed)
IC LM for pt. refaxed order

## 2018-04-19 NOTE — Telephone Encounter (Signed)
re-faxed

## 2018-05-03 ENCOUNTER — Ambulatory Visit (INDEPENDENT_AMBULATORY_CARE_PROVIDER_SITE_OTHER): Payer: Medicare Other

## 2018-05-03 ENCOUNTER — Ambulatory Visit (INDEPENDENT_AMBULATORY_CARE_PROVIDER_SITE_OTHER): Payer: Medicare Other | Admitting: Orthopedic Surgery

## 2018-05-03 DIAGNOSIS — Z96611 Presence of right artificial shoulder joint: Secondary | ICD-10-CM | POA: Diagnosis not present

## 2018-05-04 ENCOUNTER — Encounter (INDEPENDENT_AMBULATORY_CARE_PROVIDER_SITE_OTHER): Payer: Self-pay | Admitting: Orthopedic Surgery

## 2018-05-04 NOTE — Progress Notes (Signed)
Post-Op Visit Note   Patient: [name]           Date of Birth: [birth year]           MRN: [id number] Visit Date: 05/03/2018 PCP: Hilbert Corrigan, MD   Assessment & Plan:  Chief Complaint:  Chief Complaint  Patient presents with  . Right Shoulder - Injury   Visit Diagnoses:  1. Status post reverse total replacement of right shoulder     Plan: [name] is a patient who underwent right reverse shoulder replacement in June.  This is about 3 months ago.  She actually fell trying to balance using a walker in her facility.  The facility did portable x-ray and advised her that her shoulder was dislocated.  On examination she does have pain with motion but radiographs show location of the prosthesis.  Her deltoid still fires but it is a little weaker.  I do not detect any coarseness or grinding with passive range of motion of the shoulder.  Impression is contusion right shoulder with no evidence of damage to the reverse shoulder replacement.  Plan is to keep her in the sling for the rest of the week just to let the pain come down and then to restart physical therapy.  I will see her back in about 4 to 6 weeks if she is not improving.  Follow-Up Instructions: Return if symptoms worsen or fail to improve.   Orders:  Orders Placed This Encounter  Procedures  . XR Shoulder Right   No orders of the defined types were placed in this encounter.   Imaging: No results found.  PMFS History: Patient Active Problem List   Diagnosis Date Noted  . Rotator cuff arthropathy of right shoulder   . Shoulder arthritis 06/15/2017  . Status post revision of total knee replacement, left 04/29/2017  . Polyethylene liner wear following left total knee arthroplasty requiring isolated polyethylene liner exchange ([place]) 03/02/2017  . 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 Unstable angina ([place]) 05/21/2016  . Chronic diastolic heart failure ([place]) 03/29/2016  .  Normal coronary arteries 2009 01/14/2015  . Obesity-BMI 40 01/14/2015  . Restless leg 01/14/2015  . Chest pain 01/14/2015  . Back pain 01/14/2015  . Renal insufficiency 01/14/2015  . Dementia- mild memory issues 01/14/2015  . Occult blood positive stool 12/14/2014  . Anemia 12/14/2014  . Family history of colon cancer 12/14/2014  . Change in bowel habits 12/14/2014  . GERD (gastroesophageal reflux disease) 12/14/2014  . Dyspnea 05/17/2012  . Lymphedema 05/17/2012  . Weight gain 05/17/2012  . HTN (hypertension) 05/17/2012   Past Medical History:  Diagnosis Date  . Anemia   . Anxiety   . Arthritis    "hands, feet" (03/03/2017)  . Brain lesion    2 types  . Cervical spondylosis with myelopathy   . Chest pain    Normal cardiac cath 5/09  . Chronic lower back pain   . CKD (chronic kidney disease), stage III ([place])    [name]  . Congestive heart failure (CHF) ([place])    has a Cardiomems implant   . Constipation   . Dementia   . Depression   . Dumping syndrome   . Dyspnea    with activity  . Edema   . Fatigue   . Fibromyalgia   . GERD (gastroesophageal reflux disease)   . Headache    "w/high CBG" (03/03/2017)  . History of echocardiogram  a. Echo 03/20/16 (done at [facility] in [place], [place]):  mild LVH, EF 01%, normal diastolic function, mild LAE, MAC, RVSP 25 mmHg  . Hyperlipidemia   . Hypertension   . Hypothyroidism   . Lumbar spondylosis   . Lymphedema    seeing specialist for this  . Migraine    "mostly stopped when I changed my diet" (03/03/2017)  . OSA on CPAP   . Pneumonia X 1  . PONV (postoperative nausea and vomiting)   . Restless leg syndrome   . Rheumatoid arthritis ([place])   . Stroke [facility])    TIAs "mini strokes"- unsure of last TIA - pt and daughter deny this  . Syncope   . Tremors of nervous system   . Type II diabetes mellitus (HCC)     Family History  Problem Relation Age of Onset  . Heart disease Mother   . Heart failure  Mother   . Kidney disease Mother   . Lung cancer Father   . Colon cancer Brother   . Cancer Brother        malignant thymoma  . Heart disease Brother   . Tremor Maternal Grandmother 90  . Ovarian cancer Maternal Grandmother   . Uterine cancer Maternal Grandmother   . Irritable bowel syndrome Daughter   . Esophageal cancer Neg Hx   . Stomach cancer Neg Hx   . Rectal cancer Neg Hx     Past Surgical History:  Procedure Laterality Date  . APPENDECTOMY  1966  . BACK SURGERY    . CARDIAC CATHETERIZATION N/A 05/25/2016   Procedure: Right/Left Heart Cath and Coronary Angiography;  Surgeon: Larey Dresser, MD;  Location: [place];  Service: Cardiovascular;  Laterality: N/A;  . CATARACT EXTRACTION W/ INTRAOCULAR LENS  IMPLANT, BILATERAL Bilateral   . COLONOSCOPY    . CORONARY ANGIOGRAM  2009   Normal coronaries  . EXCISION/RELEASE BURSA HIP Bilateral   . I&D KNEE WITH POLY EXCHANGE Left 03/02/2017   Procedure: Left knee Revision of poly liner;  Surgeon: Mcarthur Rossetti, MD;  Location: [facility];  Service: Orthopedics;  Laterality: Left;  . JOINT REPLACEMENT    . KNEE ARTHROSCOPY Bilateral   . LAPAROSCOPIC CHOLECYSTECTOMY  2015  . LUMBAR DISC SURGERY    . LUMBAR LAMINECTOMY/DECOMPRESSION MICRODISCECTOMY Left 12/2005   L2-3 laminectomy and diskectomy/notes 01/06/2011  . POSTERIOR LUMBAR FUSION  04/2006   [name] 01/06/2011; "put cages in"  . REVERSE SHOULDER ARTHROPLASTY Left 06/15/2017   Procedure: REVERSE LEFT SHOULDER ARTHROPLASTY;  Surgeon: Meredith Pel, MD;  Location: [facility];  Service: Orthopedics;  Laterality: Left;  . REVERSE SHOULDER ARTHROPLASTY Right 02/01/2018  . REVERSE SHOULDER ARTHROPLASTY Right 02/01/2018   Procedure: RIGHT REVERSE SHOULDER ARTHROPLASTY;  Surgeon: Meredith Pel, MD;  Location: [facility];  Service: Orthopedics;  Laterality: Right;  . REVISION TOTAL KNEE ARTHROPLASTY Left 03/02/2017   poly liner/notes 03/02/2017  . RIGHT HEART CATH N/A  11/13/2016   Procedure: Right Heart Cath with Cardiomems;  Surgeon: Larey Dresser, MD;  Location: [facility];  Service: Cardiovascular;  Laterality: N/A;  . SHOULDER ARTHROSCOPY WITH ROTATOR CUFF REPAIR Right   . SHOULDER OPEN ROTATOR CUFF REPAIR Left 01/2011   [name] 01/29/2011  . TOTAL ABDOMINAL HYSTERECTOMY    . TOTAL KNEE ARTHROPLASTY Left   . TOTAL KNEE ARTHROPLASTY Right 11/18/2012   Procedure: TOTAL KNEE ARTHROPLASTY;  Surgeon: Yvette Rack., MD;  Location: [facility];  Service: Orthopedics;  Laterality: Right;  .  TUBAL LIGATION    . UPPER GI ENDOSCOPY     Social History   Occupational History  . Occupation: retired  Tobacco Use  . Smoking status: Never Smoker  . Smokeless tobacco: Never Used  Substance and Sexual Activity  . Alcohol use: No    Alcohol/week: 0.0 standard drinks  . Drug use: No  . Sexual activity: Not Currently

## 2018-05-10 ENCOUNTER — Telehealth: Payer: Self-pay | Admitting: Gastroenterology

## 2018-05-10 NOTE — Telephone Encounter (Signed)
[name], I cannot find any records to review. Please let me know where they are or put in one of my InBoxes. Thanks.

## 2018-05-11 NOTE — Telephone Encounter (Signed)
[place] to have records refaxed and was told to call back on Friday and speak with Lamount Cohen to have the records faxed again.

## 2018-05-12 NOTE — Telephone Encounter (Signed)
Records left in inbox today. I reviewed.  The consultation request is for a change in bowel habits. When you read the only records that are available he describes that the patient has had complains of intermittent and ongoing change in bowel habits that have been ongoing for years and is "nothing new for her". In the notes there is a smart goal that suggest the members having regular formed bowel movements. Describes a colonoscopy more than 10 years ago.  Hemoglobin per report is 11.3 hematocrit 35.3 entheses this is per documentation though I do not see the actual lab or April 13, 2018)  I have reviewed what was given and it is reasonable for an outpatient visit to discuss any additional work-up that may be required. If able to get any colon studies that she has had done previously as well as a colonoscopy that was done more than 10 years ago that would be ideal. Please obtain any recent blood work for review from within the last 6 months.  I have signed the documents.

## 2018-05-19 ENCOUNTER — Encounter: Payer: Self-pay | Admitting: Gastroenterology

## 2018-05-23 NOTE — Telephone Encounter (Signed)
Pt is scheduled with Dr. Rush Landmark on 06-28-18

## 2018-05-30 ENCOUNTER — Ambulatory Visit (INDEPENDENT_AMBULATORY_CARE_PROVIDER_SITE_OTHER): Payer: No Typology Code available for payment source | Admitting: Orthopedic Surgery

## 2018-06-01 ENCOUNTER — Ambulatory Visit (INDEPENDENT_AMBULATORY_CARE_PROVIDER_SITE_OTHER): Payer: Medicare Other | Admitting: Orthopedic Surgery

## 2018-06-01 ENCOUNTER — Encounter (INDEPENDENT_AMBULATORY_CARE_PROVIDER_SITE_OTHER): Payer: Self-pay | Admitting: Orthopedic Surgery

## 2018-06-01 DIAGNOSIS — M25511 Pain in right shoulder: Secondary | ICD-10-CM | POA: Diagnosis not present

## 2018-06-01 NOTE — Progress Notes (Signed)
Office Visit Note   Patient: [name]           Date of Birth: Sep 29, 1945           MRN: [id number] Visit Date: 06/01/2018 Requested by: Hilbert Corrigan, [address], [place] [id number] PCP: Hilbert Corrigan, MD  Subjective: Chief Complaint  Patient presents with  . Right Shoulder - Follow-up  . Left Shoulder - Follow-up    HPI: Patient presents with improving right shoulder pain after a fall.  She had right reverse shoulder replacement 02/01/2018.  She is been doing recently well with that.  She is done very well with the left reverse shoulder replacement.  She is doing exercises and using weights and has pulleys.  Takes occasional pain medicine such as Tylenol.  Her right arm is improving but not as quickly as the left arm dead.              ROS: All systems reviewed are negative as they relate to the chief complaint within the history of present illness.  Patient denies  fevers or chills.   Assessment & Plan: Visit Diagnoses:  1. Acute pain of right shoulder     Plan: Impression is pretty reasonable active and passive range of motion of that right shoulder.  I think she is doing well with that.  I think she will continue to improve through the spring.  Continue with therapy.  I think the injury she had did not loosen or damage any components.  I will see her back in December  Follow-Up Instructions: No follow-ups on file.   Orders:  No orders of the defined types were placed in this encounter.  No orders of the defined types were placed in this encounter.     Procedures: No procedures performed   Clinical Data: No additional findings.  Objective: Vital Signs: There were no vitals taken for this visit.  Physical Exam:   Constitutional: Patient appears well-developed HEENT:  Head: Normocephalic Eyes:EOM are normal Neck: Normal range of motion Cardiovascular: Normal rate Pulmonary/chest: Effort normal Neurologic: Patient is  alert Skin: Skin is warm Psychiatric: Patient has normal mood and affect    Ortho Exam: Ortho exam demonstrates pretty reasonable right shoulder passive range of motion.  Actively she is getting up to 90 of forward flexion and abduction.  This is slowly improving.  I do not detect any mechanical symptoms with that right shoulder.  Motor or sensory function to the hand is intact.  Incision is intact.  Specialty Comments:  No specialty comments available.  Imaging: No results found.   PMFS History: Patient Active Problem List   Diagnosis Date Noted  . Rotator cuff arthropathy of right shoulder   . Shoulder arthritis 06/15/2017  . Status post revision of total knee replacement, left 04/29/2017  . Polyethylene liner wear following left total knee arthroplasty requiring isolated polyethylene liner exchange ([place]) 03/02/2017  . 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 Unstable angina ([place]) 05/21/2016  . Chronic diastolic heart failure ([place]) 03/29/2016  . Normal coronary arteries 2009 01/14/2015  . Obesity-BMI 40 01/14/2015  . Restless leg 01/14/2015  . Chest pain 01/14/2015  . Back pain 01/14/2015  . Renal insufficiency 01/14/2015  . Dementia- mild memory issues 01/14/2015  . Occult blood positive stool 12/14/2014  . Anemia 12/14/2014  . Family history of colon cancer 12/14/2014  . Change in bowel habits 12/14/2014  . GERD (gastroesophageal reflux disease) 12/14/2014  .  Dyspnea 05/17/2012  . Lymphedema 05/17/2012  . Weight gain 05/17/2012  . HTN (hypertension) 05/17/2012   Past Medical History:  Diagnosis Date  . Anemia   . Anxiety   . Arthritis    "hands, feet" (03/03/2017)  . Brain lesion    2 types  . Cervical spondylosis with myelopathy   . Chest pain    Normal cardiac cath 5/09  . Chronic lower back pain   . CKD (chronic kidney disease), stage III ([place])    [name]  . Congestive heart failure (CHF) ([place])    has a  Cardiomems implant   . Constipation   . Dementia ([place])   . Depression   . Dumping syndrome   . Dyspnea    with activity  . Edema   . Fatigue   . Fibromyalgia   . GERD (gastroesophageal reflux disease)   . Headache    "w/high CBG" (03/03/2017)  . History of echocardiogram    a. Echo 03/20/16 (done at [facility] in [place], [place]):  mild LVH, EF 81%, normal diastolic function, mild LAE, MAC, RVSP 25 mmHg  . Hyperlipidemia   . Hypertension   . Hypothyroidism   . Lumbar spondylosis   . Lymphedema    seeing specialist for this  . Migraine    "mostly stopped when I changed my diet" (03/03/2017)  . OSA on CPAP   . Pneumonia X 1  . PONV (postoperative nausea and vomiting)   . Restless leg syndrome   . Rheumatoid arthritis ([place])   . Stroke [facility])    TIAs "mini strokes"- unsure of last TIA - pt and daughter deny this  . Syncope   . Tremors of nervous system   . Type II diabetes mellitus (HCC)     Family History  Problem Relation Age of Onset  . Heart disease Mother   . Heart failure Mother   . Kidney disease Mother   . Lung cancer Father   . Colon cancer Brother   . Cancer Brother        malignant thymoma  . Heart disease Brother   . Tremor Maternal Grandmother 90  . Ovarian cancer Maternal Grandmother   . Uterine cancer Maternal Grandmother   . Irritable bowel syndrome Daughter   . Esophageal cancer Neg Hx   . Stomach cancer Neg Hx   . Rectal cancer Neg Hx     Past Surgical History:  Procedure Laterality Date  . APPENDECTOMY  1966  . BACK SURGERY    . CARDIAC CATHETERIZATION N/A 05/25/2016   Procedure: Right/Left Heart Cath and Coronary Angiography;  Surgeon: Larey Dresser, MD;  Location: [facility];  Service: Cardiovascular;  Laterality: N/A;  . CATARACT EXTRACTION W/ INTRAOCULAR LENS  IMPLANT, BILATERAL Bilateral   . COLONOSCOPY    . CORONARY ANGIOGRAM  2009   Normal coronaries  . EXCISION/RELEASE BURSA HIP Bilateral   . I&D KNEE WITH POLY EXCHANGE Left  03/02/2017   Procedure: Left knee Revision of poly liner;  Surgeon: Mcarthur Rossetti, MD;  Location: [facility];  Service: Orthopedics;  Laterality: Left;  . JOINT REPLACEMENT    . KNEE ARTHROSCOPY Bilateral   . LAPAROSCOPIC CHOLECYSTECTOMY  2015  . LUMBAR DISC SURGERY    . LUMBAR LAMINECTOMY/DECOMPRESSION MICRODISCECTOMY Left 12/2005   L2-3 laminectomy and diskectomy/notes 01/06/2011  . POSTERIOR LUMBAR FUSION  04/2006   [name] 01/06/2011; "put cages in"  . REVERSE SHOULDER ARTHROPLASTY Left 06/15/2017   Procedure: REVERSE  LEFT SHOULDER ARTHROPLASTY;  Surgeon: Meredith Pel, MD;  Location: [facility];  Service: Orthopedics;  Laterality: Left;  . REVERSE SHOULDER ARTHROPLASTY Right 02/01/2018  . REVERSE SHOULDER ARTHROPLASTY Right 02/01/2018   Procedure: RIGHT REVERSE SHOULDER ARTHROPLASTY;  Surgeon: Meredith Pel, MD;  Location: [facility];  Service: Orthopedics;  Laterality: Right;  . REVISION TOTAL KNEE ARTHROPLASTY Left 03/02/2017   poly liner/notes 03/02/2017  . RIGHT HEART CATH N/A 11/13/2016   Procedure: Right Heart Cath with Cardiomems;  Surgeon: Larey Dresser, MD;  Location: [facility];  Service: Cardiovascular;  Laterality: N/A;  . SHOULDER ARTHROSCOPY WITH ROTATOR CUFF REPAIR Right   . SHOULDER OPEN ROTATOR CUFF REPAIR Left 01/2011   [name] 01/29/2011  . TOTAL ABDOMINAL HYSTERECTOMY    . TOTAL KNEE ARTHROPLASTY Left   . TOTAL KNEE ARTHROPLASTY Right 11/18/2012   Procedure: TOTAL KNEE ARTHROPLASTY;  Surgeon: Yvette Rack., MD;  Location: [facility];  Service: Orthopedics;  Laterality: Right;  . TUBAL LIGATION    . UPPER GI ENDOSCOPY     Social History   Occupational History  . Occupation: retired  Tobacco Use  . Smoking status: Never Smoker  . Smokeless tobacco: Never Used  Substance and Sexual Activity  . Alcohol use: No    Alcohol/week: 0.0 standard drinks  . Drug use: No  . Sexual activity: Not Currently

## 2018-06-02 ENCOUNTER — Encounter: Payer: Self-pay | Admitting: "Endocrinology

## 2018-06-15 ENCOUNTER — Ambulatory Visit (HOSPITAL_COMMUNITY): Payer: Medicare Other | Admitting: Physical Therapy

## 2018-06-15 ENCOUNTER — Encounter (HOSPITAL_COMMUNITY): Payer: Self-pay

## 2018-06-16 ENCOUNTER — Encounter (HOSPITAL_COMMUNITY): Payer: Self-pay

## 2018-06-16 ENCOUNTER — Ambulatory Visit (HOSPITAL_COMMUNITY)
Admission: RE | Admit: 2018-06-16 | Discharge: 2018-06-16 | Disposition: A | Discharge status: Home / Self Care | Payer: Medicare Other | Source: Ambulatory Visit | Attending: Internal Medicine | Admitting: Internal Medicine

## 2018-06-16 VITALS — BP 134/82 | HR 76 | Wt 278.8 lb

## 2018-06-16 DIAGNOSIS — I890 Lymphedema, not elsewhere classified: Secondary | ICD-10-CM | POA: Diagnosis not present

## 2018-06-16 DIAGNOSIS — G8929 Other chronic pain: Secondary | ICD-10-CM | POA: Insufficient documentation

## 2018-06-16 DIAGNOSIS — F0390 Unspecified dementia without behavioral disturbance: Secondary | ICD-10-CM | POA: Diagnosis not present

## 2018-06-16 DIAGNOSIS — I5032 Chronic diastolic (congestive) heart failure: Secondary | ICD-10-CM | POA: Diagnosis not present

## 2018-06-16 DIAGNOSIS — I10 Essential (primary) hypertension: Secondary | ICD-10-CM | POA: Diagnosis not present

## 2018-06-16 DIAGNOSIS — N183 Chronic kidney disease, stage 3 unspecified: Secondary | ICD-10-CM

## 2018-06-16 DIAGNOSIS — M797 Fibromyalgia: Secondary | ICD-10-CM | POA: Insufficient documentation

## 2018-06-16 DIAGNOSIS — Z7982 Long term (current) use of aspirin: Secondary | ICD-10-CM | POA: Insufficient documentation

## 2018-06-16 DIAGNOSIS — E1122 Type 2 diabetes mellitus with diabetic chronic kidney disease: Secondary | ICD-10-CM | POA: Insufficient documentation

## 2018-06-16 DIAGNOSIS — I2510 Atherosclerotic heart disease of native coronary artery without angina pectoris: Secondary | ICD-10-CM | POA: Diagnosis not present

## 2018-06-16 DIAGNOSIS — G2581 Restless legs syndrome: Secondary | ICD-10-CM | POA: Insufficient documentation

## 2018-06-16 DIAGNOSIS — I130 Hypertensive heart and chronic kidney disease with heart failure and stage 1 through stage 4 chronic kidney disease, or unspecified chronic kidney disease: Secondary | ICD-10-CM | POA: Diagnosis not present

## 2018-06-16 DIAGNOSIS — Z79899 Other long term (current) drug therapy: Secondary | ICD-10-CM | POA: Diagnosis not present

## 2018-06-16 LAB — BASIC METABOLIC PANEL
Anion gap: 8 (ref 5–15)
BUN: 27 mg/dL — AB (ref 8–23)
CHLORIDE: 99 mmol/L (ref 98–111)
CO2: 31 mmol/L (ref 22–32)
CREATININE: 1.32 mg/dL — AB (ref 0.44–1.00)
Calcium: 9.8 mg/dL (ref 8.9–10.3)
GFR, EST AFRICAN AMERICAN: 45 mL/min — AB (ref >60)
GFR, EST NON AFRICAN AMERICAN: 39 mL/min — AB (ref >60)
Glucose, Bld: 107 mg/dL — ABNORMAL HIGH (ref 70–99)
Potassium: 4.8 mmol/L (ref 3.5–5.1)
Sodium: 138 mmol/L (ref 135–145)

## 2018-06-16 NOTE — Patient Instructions (Signed)
Today you have been seen at the Heart failure clinic at [facility]   Lab work done today: BMET  We will call you if your lab work is abnormal.. No news is good news!!   Follow up with [facility] failure [facility] in 6 months April 24th at 10 am    Do the following things EVERYDAY: 1) Weigh yourself in the morning before breakfast. Write it down and keep it in a log. 2) Take your medicines as prescribed 3) Eat low salt foods-Limit salt (sodium) to 2000 mg per day.  4) Stay as active as you can everyday 5) Limit all fluids for the day to less than 2 liters

## 2018-06-16 NOTE — Progress Notes (Signed)
Advanced Heart Failure Clinic Note   Primary Care: Dr. Edrick Oh Renal: Dr Joelyn Oms HF Cardiology: Dr. Aundra Dubin  HPI: [name] is a 72 y.o. with history of chronic diastolic CHF (Echo 6/64/40 LVEF 65%, Mild LAE), chronic lymphedema, chronic back pain, HTN, CKD stage 3, and fibromyalgia.    Admitted several times in 02/2016 to [facility] with CHF and chest pain.  Has been tried on spironolactone + metolazone alone as diuretic regimen. Follows with renal as above.   She was admitted in 9/17 with chest pain and AKI.  She had seen nephrology and was given metolazone to take daily along with Lasix, creatinine rose considerably.  Lasix was decreased to 40 mg bid.  When creatinine improved, she had right and left heart cath showing nonobstructive CAD and near-normal filling pressures.  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    Had Cardiomems placed on 11/13/16. PA diastolic 9 mmHg at implant time.   Today she presents for 3 month follow up with her daughter. Overall feeling fine. Denies SOB/PND/Orthopnea. Eats lots of ice.  Appetite ok. No fever or chills. Not being weighed daily at SNF.  Having ongoing leg edema. Taking all medications. Currently [facility].    Labs (9/17): K 4.6, creatinine 1.55, BNP 63 Labs (10/17): K 4.7, creatinine 1.8 Labs ( 09/06/2016): K 3.7 Creatinine 2.05  Labs (1/18): K 5.1, creatinine 2.03 Labs (2/18): hgb 12, K 4.3, creatinine 1.87 Labs (11/13/2016) K 3.8 Creatinine 2.12  Labs (10/18): K 3.7, creatinine 1.6 Labs (1/19): K 4, creatinine 1.46 Labs (01/26/2018): K 4.1 Creatinine 1.47   Review of systems complete and found to be negative unless listed in HPI.    Past Medical History 1. Chronic diastolic CHF - Echo 3/47/42 LVEF 65%, Mild LAE - RHC (10/17): mean RA 4, PA 25/11, mean PCWP 5, CI 2.4.  - Echo (2/18): EF 55-60%, mild LVH, normal RV size and systolic function. - Cardiomems implanted 3/18.  2. CKD stage 3:  Follows with nephology. Baseline appears to be creatinine 1.4-1.6.  3. Chronic lymphedema: Has had wraps.  4. HTN 5. Dementia 6. Fibromyalgia 7. Microvascular angina:  - Normal coronaries on Cath 2009 - Stress Myoview 04/09/16 with no ischemia - LHC (10/17) with mild nonobstructive disease.  8. Restless leg syndrome 9. H/o shoulder replacement.   FH: Mother with CHF, brother with "heart disease."  Another brother with h/o MI.   Current Outpatient Medications  Medication Sig Dispense Refill  . acetaminophen (TYLENOL) 650 MG CR tablet Take 650 mg by mouth every 4 (four) hours as needed for pain.    Marland Kitchen amLODipine (NORVASC) 10 MG tablet Take 1 tablet (10 mg total) by mouth daily. (Patient taking differently: Take 10 mg by mouth daily. (1000)) 30 tablet 6  . antiseptic oral rinse (BIOTENE) LIQD 10 mLs by Mouth Rinse route 4 (four) times daily. (0900, 1000, 1200 & 2100)    . aspirin EC 81 MG tablet Take 1 tablet (81 mg total) by mouth daily. (Patient taking differently: Take 81 mg by mouth daily. (0900)) 90 tablet 3  . baclofen (LIORESAL) 10 MG tablet Take 10 mg by mouth 3 (three) times daily. (0900, 1400, & 2100)    . budesonide (PULMICORT) 0.5 MG/2ML nebulizer solution Take 0.5 mg by nebulization 2 (two) times daily. (1000 & 2200)    . Cholecalciferol (VITAMIN D3) 1000 units CAPS Take 1,000 Units by mouth daily. (0900)    . cloNIDine (CATAPRES) 0.1  MG tablet Take 0.1 mg by mouth 2 (two) times daily. (0900 & 2100)    . Cyanocobalamin (VITAMIN B-12 IJ) Inject 1,000 mcg as directed every 30 (thirty) days. Weekly for 4 weeks, has 1 more dose this week (week of 7/2) then will switch to once a month     . cyclobenzaprine (FLEXERIL) 5 MG tablet Take 10 mg by mouth 2 (two) times daily. (0800 & 2100)    . donepezil (ARICEPT) 5 MG tablet Take 5 mg by mouth daily at 10 pm. (2100)    . DULoxetine (CYMBALTA) 30 MG capsule Take 30 mg by mouth daily. (0900)    . gabapentin (NEURONTIN) 300 MG capsule Take 300  mg by mouth 2 (two) times daily. (0900 & 2100)    . ipratropium-albuterol (DUONEB) 0.5-2.5 (3) MG/3ML SOLN Take 3 mLs by nebulization every 6 (six) hours as needed (FOR WHEEZING/SHORTNESS OF BREATH).    Marland Kitchen lamoTRIgine (LAMICTAL) 100 MG tablet Take 100 mg by mouth 2 (two) times daily. (0900 & 2100)    . levothyroxine (SYNTHROID, LEVOTHROID) 88 MCG tablet Take 88 mcg by mouth daily before breakfast. (0900)    . Melatonin 3 MG CAPS Take 3 mg by mouth at bedtime. (2100)    . omeprazole (PRILOSEC) 20 MG capsule Take 20 mg by mouth 2 (two) times daily before a meal. (0900 & 1700)    . pravastatin (PRAVACHOL) 10 MG tablet Take 10 mg by mouth daily. (2200)    . pravastatin (PRAVACHOL) 40 MG tablet Take 40 mg by mouth daily. (2200)    . rOPINIRole (REQUIP) 1 MG tablet Take 2 mg by mouth at bedtime. (2100)    . rOPINIRole (REQUIP) 2 MG tablet Take 2 mg by mouth every 12 (twelve) hours as needed (for restless leg).    . sennosides-docusate sodium (SENOKOT-S) 8.6-50 MG tablet Take 1 tablet by mouth 2 (two) times daily. (0900 & 2100)    . torsemide (DEMADEX) 20 MG tablet Take 4 tablets (80 mg total) by mouth daily. (Patient taking differently: Take 80 mg by mouth daily. (0900)) 120 tablet 3  . traZODone (DESYREL) 50 MG tablet Take 50 mg by mouth at bedtime. (2100)     No current facility-administered medications for this encounter.    No Known Allergies  Social History   Socioeconomic History  . Marital status: Widowed    Spouse name: Not on file  . Number of children: 3  . Years of education: Not on file  . Highest education level: Not on file  Occupational History  . Occupation: retired  Scientific laboratory technician  . Financial resource strain: Not on file  . Food insecurity:    Worry: Not on file    Inability: Not on file  . Transportation needs:    Medical: Not on file    Non-medical: Not on file  Tobacco Use  . Smoking status: Never Smoker  . Smokeless tobacco: Never Used  Substance and Sexual  Activity  . Alcohol use: No    Alcohol/week: 0.0 standard drinks  . Drug use: No  . Sexual activity: Not Currently  Lifestyle  . Physical activity:    Days per week: Not on file    Minutes per session: Not on file  . Stress: Not on file  Relationships  . Social connections:    Talks on phone: Not on file    Gets together: Not on file    Attends religious service: Not on file    Active member of  club or organization: Not on file    Attends meetings of clubs or organizations: Not on file    Relationship status: Not on file  . Intimate partner violence:    Fear of current or ex partner: Not on file    Emotionally abused: Not on file    Physically abused: Not on file    Forced sexual activity: Not on file  Other Topics Concern  . Not on file  Social History Narrative   Pt does use caffeine. Lives alone. 3 children. Retired.   Vitals:   06/16/18 1048  BP: 134/82  Pulse: 76  SpO2: 95%  Weight: 126.5 kg (278 lb 12.8 oz)   Wt Readings from Last 3 Encounters:  06/16/18 126.5 kg (278 lb 12.8 oz)  01/26/18 121.8 kg (268 lb 8 oz)  11/29/17 123.2 kg (271 lb 8 oz)   PHYSICAL EXAM: General:   No resp difficulty. Arrived in a wheel chair.  HEENT: normal Neck: supple. no JVD. Carotids 2+ bilat; no bruits. No lymphadenopathy or thryomegaly appreciated. Cor: PMI nondisplaced. Regular rate & rhythm. No rubs, gallops or murmurs. Lungs: clear Abdomen: soft, nontender, nondistended. No hepatosplenomegaly. No bruits or masses. Good bowel sounds. Extremities: no cyanosis, clubbing, rash, R and LLE compression stockings. Trace edema Neuro: alert & orientedx3, cranial nerves grossly intact. moves all 4 extremities w/o difficulty. Affect pleasant  ASSESSMENT & PLAN: 1. Chronic diastolic CHF:  Echo (6/75) with EF 55-60%, normal RV.  Has Cardiomems. PADP at goal at 9 mmHg today (goal has been 6-13 mmHg).   Todays Cardiomems reading is -->PAD 8 mmHG - Volume status stable. Continue torsemide 80  mg daily. Discussed limiting fluid intake < 2 liters per day.  - She needs to take ASA 81 mg daily given Cardiomems present.  2. Lymphedema: Plan to to restart lymph edema pumps.   3. CKD: Stage 3 Check BMET today   4. Chest pain: LHC 2017 Nonobstructive.   No s/s ischemia.    5. HTN:  Stable.  6. DM2:  Per PCP   Follow up in 6 months  Greater than 50% of the (total minutes 25) visit spent in counseling/coordination of care regarding the above.     Darrick Grinder, NP  06/16/2018

## 2018-06-23 ENCOUNTER — Ambulatory Visit (HOSPITAL_COMMUNITY): Payer: Medicare Other | Attending: Internal Medicine | Admitting: Physical Therapy

## 2018-06-23 ENCOUNTER — Other Ambulatory Visit: Payer: Self-pay

## 2018-06-23 DIAGNOSIS — R262 Difficulty in walking, not elsewhere classified: Secondary | ICD-10-CM

## 2018-06-23 DIAGNOSIS — I890 Lymphedema, not elsewhere classified: Secondary | ICD-10-CM | POA: Diagnosis present

## 2018-06-23 NOTE — Therapy (Signed)
[place], [place], [address] Phone: [phone]   Fax:  [phone]  Physical Therapy Evaluation  Patient Details  Name: [name] MRN: [id number] Date of Birth: 09/24/1945 Referring Provider (PT): Hilbert Corrigan   Encounter Date: 06/23/2018  PT End of Session - 06/23/18 1545    Visit Number  1    Number of Visits  18    Date for PT Re-Evaluation  08/04/18    Authorization Type  medicare    PT Start Time  1300    PT Stop Time  1435    PT Time Calculation (min)  95 min    Activity Tolerance  Patient tolerated treatment well    Behavior During Therapy  [facility] for tasks assessed/performed       Past Medical History:  Diagnosis Date  . Anemia   . Anxiety   . Arthritis    "hands, feet" (03/03/2017)  . Brain lesion    2 types  . Cervical spondylosis with myelopathy   . Chest pain    Normal cardiac cath 5/09  . Chronic lower back pain   . CKD (chronic kidney disease), stage III ([place])    [name]  . Congestive heart failure (CHF) ([place])    has a Cardiomems implant   . Constipation   . Dementia ([place])   . Depression   . Dumping syndrome   . Dyspnea    with activity  . Edema   . Fatigue   . Fibromyalgia   . GERD (gastroesophageal reflux disease)   . Headache    "w/high CBG" (03/03/2017)  . History of echocardiogram    a. Echo 03/20/16 (done at [facility] in [place], [place]):  mild LVH, EF 60%, normal diastolic function, mild LAE, MAC, RVSP 25 mmHg  . Hyperlipidemia   . Hypertension   . Hypothyroidism   . Lumbar spondylosis   . Lymphedema    seeing specialist for this  . Migraine    "mostly stopped when I changed my diet" (03/03/2017)  . OSA on CPAP   . Pneumonia X 1  . PONV (postoperative nausea and vomiting)   . Restless leg syndrome   . Rheumatoid arthritis ([place])   . Stroke [facility])    TIAs "mini strokes"- unsure of last TIA - pt and daughter deny this  . Syncope    . Tremors of nervous system   . Type II diabetes mellitus ([place])     Past Surgical History:  Procedure Laterality Date  . APPENDECTOMY  1966  . BACK SURGERY    . CARDIAC CATHETERIZATION N/A 05/25/2016   Procedure: Right/Left Heart Cath and Coronary Angiography;  Surgeon: Larey Dresser, MD;  Location: [place];  Service: Cardiovascular;  Laterality: N/A;  . CATARACT EXTRACTION W/ INTRAOCULAR LENS  IMPLANT, BILATERAL Bilateral   . COLONOSCOPY    . CORONARY ANGIOGRAM  2009   Normal coronaries  . EXCISION/RELEASE BURSA HIP Bilateral   . I&D KNEE WITH POLY EXCHANGE Left 03/02/2017   Procedure: Left knee Revision of poly liner;  Surgeon: Mcarthur Rossetti, MD;  Location: [facility];  Service: Orthopedics;  Laterality: Left;  . JOINT REPLACEMENT    . KNEE ARTHROSCOPY Bilateral   . LAPAROSCOPIC CHOLECYSTECTOMY  2015  . LUMBAR DISC SURGERY    . LUMBAR LAMINECTOMY/DECOMPRESSION MICRODISCECTOMY Left 12/2005   L2-3 laminectomy and diskectomy/notes 01/06/2011  . POSTERIOR LUMBAR FUSION  04/2006   [name]  01/06/2011; "put cages in"  . REVERSE SHOULDER ARTHROPLASTY Left 06/15/2017   Procedure: REVERSE LEFT SHOULDER ARTHROPLASTY;  Surgeon: Meredith Pel, MD;  Location: [facility];  Service: Orthopedics;  Laterality: Left;  . REVERSE SHOULDER ARTHROPLASTY Right 02/01/2018  . REVERSE SHOULDER ARTHROPLASTY Right 02/01/2018   Procedure: RIGHT REVERSE SHOULDER ARTHROPLASTY;  Surgeon: Meredith Pel, MD;  Location: [facility];  Service: Orthopedics;  Laterality: Right;  . REVISION TOTAL KNEE ARTHROPLASTY Left 03/02/2017   poly liner/notes 03/02/2017  . RIGHT HEART CATH N/A 11/13/2016   Procedure: Right Heart Cath with Cardiomems;  Surgeon: Larey Dresser, MD;  Location: [place] [facility];  Service: Cardiovascular;  Laterality: N/A;  . SHOULDER ARTHROSCOPY WITH ROTATOR CUFF REPAIR Right   . SHOULDER OPEN ROTATOR CUFF REPAIR Left 01/2011   [name] 01/29/2011  . TOTAL ABDOMINAL HYSTERECTOMY    .  TOTAL KNEE ARTHROPLASTY Left   . TOTAL KNEE ARTHROPLASTY Right 11/18/2012   Procedure: TOTAL KNEE ARTHROPLASTY;  Surgeon: Yvette Rack., MD;  Location: [facility];  Service: Orthopedics;  Laterality: Right;  . TUBAL LIGATION    . UPPER GI ENDOSCOPY      There were no vitals filed for this visit.   Subjective Assessment - 06/23/18 1306    Subjective  Pt states that she is currently a resident at [facility].  She went to [facility] in  February of this year after having multple falls.  She has a history of lymphedema and  was using a pump daily but both leg sleeves have been  torn since June of this year, therefore she has not been able to pump .  The patient states that she has been wearing her compression garment, however, her legs have increased to a point where she can not get her legs into her garments at this time, therefore she has been referred to skilled physical therapy.  She states that she has lost her wraps.      Pertinent History  B lymphedema, TSR, TKR, CHF, CKD, OA     How long can you sit comfortably?  no problem     How long can you walk comfortably?  Able to walk with a walker she was walking quite a bit but since her legs have gotten so big she can only go for about 250 feet at a time due to her leg feeling like it ways 2000 lb.     Patient Stated Goals  legs to get smaller to be able to wear compression     Currently in Pain?  Yes    Pain Score  4     Pain Location  Leg    Pain Orientation  Left;Right    Pain Descriptors / Indicators  Tightness;Heaviness;Aching    Pain Type  Chronic pain    Pain Onset  More than a month ago    Pain Frequency  Constant    Aggravating Factors   walking     Pain Relieving Factors  raising her legs up     Effect of Pain on Daily Activities  limits         [facility] PT Assessment - 06/23/18 0001      Assessment   Medical Diagnosis  B lymphedema    Referring Provider (PT)  Hilbert Corrigan    Onset Date/Surgical Date  03/24/18    Significant increase in the last two months.    Next MD Visit  --   not scheduled  Prior Therapy  none      Precautions   Precautions  Fall      Restrictions   Weight Bearing Restrictions  No      Balance Screen   Has the patient fallen in the past 6 months  Yes    How many times?  2    Has the patient had a decrease in activity level because of a fear of falling?   Yes    Is the patient reluctant to leave their home because of a fear of falling?   Yes      [place]  Skilled nursing facility      Prior Function   Level of [place]  Other (comment)   living at a skilled [place]  Retired      Cognition   Overall Cognitive Status  Within [place] for tasks assessed      Observation/Other Assessments   Focus on Therapeutic Outcomes (FOTO)   --   life impact 58        LYMPHEDEMA/ONCOLOGY QUESTIONNAIRE - 06/23/18 1328      What other symptoms do you have   Are you Having Heaviness or Tightness  Yes    Are you having Pain  Yes    Are you having pitting edema  Yes    Is it Hard or Difficult finding clothes that fit  Yes    Do you have infections  No    Other Symptoms  induration       Lymphedema Stage   Stage  STAGE 3 ELEPHANTIASIS      Lymphedema Assessments   Lymphedema Assessments  Lower extremities      Right Lower Extremity Lymphedema   20 cm Proximal to Suprapatella  73 cm   2017 last visit 74.5   10 cm Proximal to Suprapatella  68.8 cm   2017 last visit 63   At Midpatella/Popliteal Crease  58.5 cm   49.5   30 cm Proximal to Floor at Lateral Plantar Foot  59.8 cm   54.5   20 cm Proximal to Floor at Lateral Plantar Foot  53.7 1   42.8   10 cm Proximal to Floor at Lateral Malleoli  42.5 cm   32.5   Circumference of ankle/heel  40.4 cm.   34.8   5 cm Proximal to 1st MTP Joint  24.4 cm   24   Across MTP Joint  23.5 cm   22.4     Left Lower Extremity Lymphedema   20 cm Proximal to  Suprapatella  75 cm   2017 last visit was 78   10 cm Proximal to Suprapatella  72 cm   66.2   At Midpatella/Popliteal Crease  59.2 cm   55   30 cm Proximal to Floor at Lateral Plantar Foot  63 cm   46.8   20 cm Proximal to Floor at Lateral Plantar Foot  54.2 cm   40.5   10 cm Proximal to Floor at Lateral Malleoli  44.4 cm   32.4   Circumference of ankle/heel  39 cm.   36   5 cm Proximal to 1st MTP Joint  24.4 cm   23.3   Across MTP Joint  23.5 cm   22.5            Objective measurements completed on examination: See above findings.      [facility] Adult PT Treatment/Exercise - 06/23/18 0001  Manual Therapy   Manual Therapy  Manual Lymphatic Drainage (MLD);Compression Bandaging    Manual therapy comments  completed seperate from all other aspects of treatment.     Compression Bandaging  foam cut for B LE followed by multilayer compression bandaging using 1/2" foam and shortstretch bandages from toe to knee B.              PT Education - 06/23/18 1545    Education Details  take bandages off if they are causing any pain     Person(s) Educated  Patient    Methods  Explanation    Comprehension  Verbalized understanding       PT Short Term Goals - 06/23/18 1555      PT SHORT TERM GOAL #1   Title  Pt to reduce LE edma by an average of 3 cm to reduce risk of cellulitis.     Time  3    Period  Weeks    Status  New    Target Date  07/14/18      PT SHORT TERM GOAL #2   Title  PT to be able to walk for over 500 ft with her walker without being fatiqued to maintain I at nursing facility.     Time  3    Period  Weeks    Status  New        PT Long Term Goals - 06/23/18 1556      PT LONG TERM GOAL #1   Title  Pt to reduce LE volume on an average of 6 cm to allow pt to be able to don her compression garment in comfort.     Time  6    Period  Weeks    Status  New    Target Date  08/04/18      PT LONG TERM GOAL #2   Title  PT to be able to don her pant  and shoes with greater ease to be able to be I in dressing     Time  6    Period  Weeks    Status  New      PT LONG TERM GOAL #3   Title  Pt to be able to ambulate with her walker for over 800 ft to allow pt to go on community outings with her family.     Time  6    Period  Weeks    Status  New             Plan - 06/23/18 1546    Clinical Impression Statement  Ms. Mcghie is a known pt who has been diagnosed with B LE lymphedema.  She was last seen in 2017.  She has managed to control her lymphedema but recently the sleeves on her pump tore and she could not find out where to replace them.  The therapist requested the pt to find out what pump she has and we will attempt to assist her in finding new sleeves but it maybe that the pt will just need to get a new pump and new sleeves.   She has not been able to fit into her compression garments therefore she has not been using them.  She is currently residing at [facility] facility but is very Independent and walks around the facility unassisted.  She has noticed, however, that she no longer is able to walk near the distance she was able due to her legs feeling so heavy  therefore she has been referred to this clinic for decongestive techniques.  Ms. Long will benefit from total decongestive techniques and compression bandaging B to thigh level to decongest her LE allowing her to walk with greater ease, and fit back into her compression garments.     History and Personal Factors relevant to plan of care:  TKR, Total shoulder replacement, lymphedema, HTN, CKD, CHF, OA     Clinical Presentation  Evolving    Clinical Decision Making  Low    Rehab Potential  Good    PT Frequency  3x / week    PT Duration  6 weeks    PT Treatment/Interventions  Compression bandaging;Manual lymph drainage;ADLs/Self Care Home Management;Therapeutic exercise    PT Next Visit Plan  Give pt exercises to improve lymphatic circulation, ask pt if she found out what  pump she has and call [facility] care to see if they can provide sleeves,  cut foam for thighs and begin compression bandaging to thigh level when foam is available.  Begin manual techniques for decongestion.        Patient will benefit from skilled therapeutic intervention in order to improve the following deficits and impairments:  Decreased activity tolerance, Difficulty walking, Increased edema, Decreased strength  Visit Diagnosis: Difficulty in walking, not elsewhere classified  Lymphedema, not elsewhere classified     Problem List Patient Active Problem List   Diagnosis Date Noted  . Rotator cuff arthropathy of right shoulder   . Shoulder arthritis 06/15/2017  . Status post revision of total knee replacement, left 04/29/2017  . Polyethylene liner wear following left total knee arthroplasty requiring isolated polyethylene liner exchange ([place]) 03/02/2017  . 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 Unstable angina ([place]) 05/21/2016  . Chronic diastolic heart failure ([place]) 03/29/2016  . Normal coronary arteries 2009 01/14/2015  . Obesity-BMI 40 01/14/2015  . Restless leg 01/14/2015  . Chest pain 01/14/2015  . Back pain 01/14/2015  . Renal insufficiency 01/14/2015  . Dementia- mild memory issues 01/14/2015  . Occult blood positive stool 12/14/2014  . Anemia 12/14/2014  . Family history of colon cancer 12/14/2014  . Change in bowel habits 12/14/2014  . GERD (gastroesophageal reflux disease) 12/14/2014  . Dyspnea 05/17/2012  . Lymphedema 05/17/2012  . Weight gain 05/17/2012  . HTN (hypertension) 05/17/2012    Rayetta Humphrey, PT CLT [phone] 06/23/2018, 4:06 PM  [place] [address] [place], [place], [address] Phone: [phone]   Fax:  [phone]  Name: [name] MRN: [id number] Date of Birth: 12-17-1945

## 2018-06-28 ENCOUNTER — Ambulatory Visit (HOSPITAL_COMMUNITY): Payer: Medicare Other | Attending: Internal Medicine | Admitting: Physical Therapy

## 2018-06-28 ENCOUNTER — Other Ambulatory Visit (INDEPENDENT_AMBULATORY_CARE_PROVIDER_SITE_OTHER): Payer: Medicare Other

## 2018-06-28 ENCOUNTER — Ambulatory Visit (HOSPITAL_COMMUNITY): Payer: Medicare Other | Admitting: Physical Therapy

## 2018-06-28 ENCOUNTER — Encounter: Payer: Self-pay | Admitting: Gastroenterology

## 2018-06-28 ENCOUNTER — Ambulatory Visit (INDEPENDENT_AMBULATORY_CARE_PROVIDER_SITE_OTHER): Payer: Medicare Other | Admitting: Gastroenterology

## 2018-06-28 VITALS — BP 120/50 | Ht 66.0 in | Wt 278.0 lb

## 2018-06-28 DIAGNOSIS — R194 Change in bowel habit: Secondary | ICD-10-CM

## 2018-06-28 DIAGNOSIS — L290 Pruritus ani: Secondary | ICD-10-CM

## 2018-06-28 DIAGNOSIS — G8929 Other chronic pain: Secondary | ICD-10-CM | POA: Insufficient documentation

## 2018-06-28 DIAGNOSIS — R262 Difficulty in walking, not elsewhere classified: Secondary | ICD-10-CM | POA: Diagnosis present

## 2018-06-28 DIAGNOSIS — K625 Hemorrhage of anus and rectum: Secondary | ICD-10-CM

## 2018-06-28 DIAGNOSIS — K649 Unspecified hemorrhoids: Secondary | ICD-10-CM

## 2018-06-28 DIAGNOSIS — I890 Lymphedema, not elsewhere classified: Secondary | ICD-10-CM | POA: Diagnosis present

## 2018-06-28 DIAGNOSIS — M25562 Pain in left knee: Secondary | ICD-10-CM | POA: Diagnosis present

## 2018-06-28 LAB — CBC
HEMATOCRIT: 36.4 % (ref 36.0–46.0)
HEMOGLOBIN: 11.9 g/dL — AB (ref 12.0–15.0)
MCHC: 32.7 g/dL (ref 30.0–36.0)
MCV: 87.3 fl (ref 78.0–100.0)
PLATELETS: 252 10*3/uL (ref 150.0–400.0)
RBC: 4.17 Mil/uL (ref 3.87–5.11)
RDW: 17.3 % — ABNORMAL HIGH (ref 11.5–15.5)
WBC: 7.4 10*3/uL (ref 4.0–10.5)

## 2018-06-28 LAB — IBC PANEL
Iron: 44 ug/dL (ref 42–145)
Saturation Ratios: 11.4 % — ABNORMAL LOW (ref 20.0–50.0)
Transferrin: 275 mg/dL (ref 212.0–360.0)

## 2018-06-28 LAB — B12 AND FOLATE PANEL: Folate: 19.4 ng/mL (ref >5.9)

## 2018-06-28 LAB — FERRITIN: Ferritin: 45.2 ng/mL (ref 10.0–291.0)

## 2018-06-28 LAB — TSH: TSH: 1.98 u[IU]/mL (ref 0.35–4.50)

## 2018-06-28 NOTE — Patient Instructions (Signed)
Your provider has requested that you go to the basement level for lab work before leaving today. Press "B" on the elevator. The lab is located at the first door on the left as you exit the elevator.   We have scheduled you for a follow up appointment with Dr Dionicio Stall on 08/05/18 at 130pm  Thank you for entrusting me with your care and choosing [facility] care.  Dr Rush Landmark

## 2018-06-28 NOTE — Therapy (Signed)
[place], [place], [address] Phone: [phone]   Fax:  [phone]  Physical Therapy Treatment  Patient Details  Name: [name] MRN: [id number] Date of Birth: Jan 05, 1946 Referring Provider (PT): Hilbert Corrigan   Encounter Date: 06/28/2018  PT End of Session - 06/28/18 1357    Visit Number  2    Number of Visits  18    Date for PT Re-Evaluation  08/04/18    Authorization Type  medicare    PT Start Time  0910    PT Stop Time  1038    PT Time Calculation (min)  88 min    Activity Tolerance  Patient tolerated treatment well    Behavior During Therapy  [facility] for tasks assessed/performed       Past Medical History:  Diagnosis Date  . Anemia   . Anxiety   . Arthritis    "hands, feet" (03/03/2017)  . Brain lesion    2 types  . Cervical spondylosis with myelopathy   . Chest pain    Normal cardiac cath 5/09  . Chronic lower back pain   . CKD (chronic kidney disease), stage III ([place])    [name]  . Congestive heart failure (CHF) ([place])    has a Cardiomems implant   . Constipation   . Dementia ([place])   . Depression   . Dumping syndrome   . Dyspnea    with activity  . Edema   . Fatigue   . Fibromyalgia   . GERD (gastroesophageal reflux disease)   . Headache    "w/high CBG" (03/03/2017)  . History of echocardiogram    a. Echo 03/20/16 (done at [facility] in [place], [place]):  mild LVH, EF 40%, normal diastolic function, mild LAE, MAC, RVSP 25 mmHg  . Hyperlipidemia   . Hypertension   . Hypothyroidism   . Lumbar spondylosis   . Lymphedema    seeing specialist for this  . Migraine    "mostly stopped when I changed my diet" (03/03/2017)  . OSA on CPAP   . Pneumonia X 1  . PONV (postoperative nausea and vomiting)   . Restless leg syndrome   . Rheumatoid arthritis ([place])   . Stroke [facility])    TIAs "mini strokes"- unsure of last TIA - pt and daughter deny this  . Syncope   .  Tremors of nervous system   . Type II diabetes mellitus ([place])     Past Surgical History:  Procedure Laterality Date  . APPENDECTOMY  1966  . BACK SURGERY    . CARDIAC CATHETERIZATION N/A 05/25/2016   Procedure: Right/Left Heart Cath and Coronary Angiography;  Surgeon: Larey Dresser, MD;  Location: [facility];  Service: Cardiovascular;  Laterality: N/A;  . CATARACT EXTRACTION W/ INTRAOCULAR LENS  IMPLANT, BILATERAL Bilateral   . COLONOSCOPY    . CORONARY ANGIOGRAM  2009   Normal coronaries  . EXCISION/RELEASE BURSA HIP Bilateral   . I&D KNEE WITH POLY EXCHANGE Left 03/02/2017   Procedure: Left knee Revision of poly liner;  Surgeon: Mcarthur Rossetti, MD;  Location: [facility];  Service: Orthopedics;  Laterality: Left;  . JOINT REPLACEMENT    . KNEE ARTHROSCOPY Bilateral   . LAPAROSCOPIC CHOLECYSTECTOMY  2015  . LUMBAR DISC SURGERY    . LUMBAR LAMINECTOMY/DECOMPRESSION MICRODISCECTOMY Left 12/2005   L2-3 laminectomy and diskectomy/notes 01/06/2011  . POSTERIOR LUMBAR FUSION  04/2006   [name]  01/06/2011; "put cages in"  . REVERSE SHOULDER ARTHROPLASTY Left 06/15/2017   Procedure: REVERSE LEFT SHOULDER ARTHROPLASTY;  Surgeon: Meredith Pel, MD;  Location: [place];  Service: Orthopedics;  Laterality: Left;  . REVERSE SHOULDER ARTHROPLASTY Right 02/01/2018  . REVERSE SHOULDER ARTHROPLASTY Right 02/01/2018   Procedure: RIGHT REVERSE SHOULDER ARTHROPLASTY;  Surgeon: Meredith Pel, MD;  Location: [facility];  Service: Orthopedics;  Laterality: Right;  . REVISION TOTAL KNEE ARTHROPLASTY Left 03/02/2017   poly liner/notes 03/02/2017  . RIGHT HEART CATH N/A 11/13/2016   Procedure: Right Heart Cath with Cardiomems;  Surgeon: Larey Dresser, MD;  Location: [place] [facility];  Service: Cardiovascular;  Laterality: N/A;  . SHOULDER ARTHROSCOPY WITH ROTATOR CUFF REPAIR Right   . SHOULDER OPEN ROTATOR CUFF REPAIR Left 01/2011   [name] 01/29/2011  . TOTAL ABDOMINAL HYSTERECTOMY    . TOTAL  KNEE ARTHROPLASTY Left   . TOTAL KNEE ARTHROPLASTY Right 11/18/2012   Procedure: TOTAL KNEE ARTHROPLASTY;  Surgeon: Yvette Rack., MD;  Location: [facility];  Service: Orthopedics;  Laterality: Right;  . TUBAL LIGATION    . UPPER GI ENDOSCOPY      There were no vitals filed for this visit.  Subjective Assessment - 06/28/18 1355    Subjective  Pt comes today stating she left her bandages in the car.  Driver called and asked to bring them back.  STates she is eager to begin full LE wraps.  States she called the pump company and left them as message about needing new sleeves.     Currently in Pain?  No/denies                       [facility] Adult PT Treatment/Exercise - 06/28/18 0001      Manual Therapy   Manual Therapy  Manual Lymphatic Drainage (MLD);Compression Bandaging    Manual therapy comments  completed seperate from all other aspects of treatment.     Manual Lymphatic Drainage (MLD)  for bilateral LE's in anterior only due to time    Compression Bandaging  foam cut for bilateral thighs               PT Short Term Goals - 06/23/18 1555      PT SHORT TERM GOAL #1   Title  Pt to reduce LE edma by an average of 3 cm to reduce risk of cellulitis.     Time  3    Period  Weeks    Status  New    Target Date  07/14/18      PT SHORT TERM GOAL #2   Title  PT to be able to walk for over 500 ft with her walker without being fatiqued to maintain I at nursing facility.     Time  3    Period  Weeks    Status  New        PT Long Term Goals - 06/23/18 1556      PT LONG TERM GOAL #1   Title  Pt to reduce LE volume on an average of 6 cm to allow pt to be able to don her compression garment in comfort.     Time  6    Period  Weeks    Status  New    Target Date  08/04/18      PT LONG TERM GOAL #2   Title  PT to be able to don her pant and shoes with  greater ease to be able to be I in dressing     Time  6    Period  Weeks    Status  New      PT LONG TERM GOAL  #3   Title  Pt to be able to ambulate with her walker for over 800 ft to allow pt to go on community outings with her family.     Time  6    Period  Weeks    Status  New            Plan - 06/28/18 1358    Clinical Impression Statement  limited time spent on manual lymph drainage due to getting thigh foam cut and ready for next session.  Completed bandaging to distal LE's with additional foam cut for indention at medial inferior knee and larger wheels cut for malleoli to help decompress edema covering these areas.  will need to contact lead nurse or director at facility to discuss importance of placing stockings on patient prior to issueing new garments.  Pt states this is why they swole back up was due to facility not assisting with donning on a daily basis.     Rehab Potential  Good    PT Frequency  3x / week    PT Duration  6 weeks    PT Treatment/Interventions  Compression bandaging;Manual lymph drainage;ADLs/Self Care Home Management;Therapeutic exercise    PT Next Visit Plan  Give pt exercises to improve lymphatic circulation.  follow up on pump sleeves, if they have returned her call. Begin compression bandaging to thigh level when foam is available.  measure on wednesdays.;        Patient will benefit from skilled therapeutic intervention in order to improve the following deficits and impairments:  Decreased activity tolerance, Difficulty walking, Increased edema, Decreased strength  Visit Diagnosis: Difficulty in walking, not elsewhere classified  Lymphedema, not elsewhere classified     Problem List Patient Active Problem List   Diagnosis Date Noted  . Rotator cuff arthropathy of right shoulder   . Shoulder arthritis 06/15/2017  . Status post revision of total knee replacement, left 04/29/2017  . Polyethylene liner wear following left total knee arthroplasty requiring isolated polyethylene liner exchange ([place]) 03/02/2017  . 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 Unstable angina ([place]) 05/21/2016  . Chronic diastolic heart failure ([place]) 03/29/2016  . Normal coronary arteries 2009 01/14/2015  . Obesity-BMI 40 01/14/2015  . Restless leg 01/14/2015  . Chest pain 01/14/2015  . Back pain 01/14/2015  . Renal insufficiency 01/14/2015  . Dementia- mild memory issues 01/14/2015  . Occult blood positive stool 12/14/2014  . Anemia 12/14/2014  . Family history of colon cancer 12/14/2014  . Change in bowel habits 12/14/2014  . GERD (gastroesophageal reflux disease) 12/14/2014  . Dyspnea 05/17/2012  . Lymphedema 05/17/2012  . Weight gain 05/17/2012  . HTN (hypertension) 05/17/2012   [name], PTA/CLT [phone]  [name] 06/28/2018, 2:03 PM  [facility] [address] [place], [place], [address] Phone: [phone]   Fax:  [phone]  Name: KAMRIE FANTON MRN: [id number] Date of Birth: 02-May-1946

## 2018-06-28 NOTE — Progress Notes (Signed)
[name] VISIT   Primary Care Provider [name], [place] [address] [phone]  Referring Provider [name] Corrigan, MD [address] [place], [place] [address] [phone]  Patient Profile: [name] is a 72 y.o. female with a pmh significant for RA, Cervical Spondylosis, CRI, Dementia, MDD/Anxiety, GERD, HTN, HLD, lymphedema, hypothyroidism, OSA, previous TIAs, DM.  The patient presents to the [facility] for an evaluation and management of problem(s) noted below:  Problem List 1. Change in bowel habits   2. Rectal bleeding   3. Hemorrhoids, unspecified hemorrhoid type   4. Anal itching     History of Present Illness: This is the patient's first visit to the [facility] outpatient clinic.  [facility] comes from [facility] with a consult for change in bowel habits which was placed initially back in September of this year.  The patient states that she has had a change in her stools over the course of the last few months.  She describes them as being more globular in characteristic.  She has noted some increased itching from her rectal region as well over the course of the last few months.  With the presumption of having steroids she had been given which hazel pads without great effect.  She has been using wipes of Preparation H without significant improvement.  She is currently based on the records that we have on Anusol cream 1-3 times per day as well as Colace 100 mg twice daily on an as-needed basis as well as senna/docusate every 12 hours as needed. She is on iron and B12 supplementation.  the past and currently she is on Prilosec for heartburn issues per report.  She has recently been seen in the lymphedema clinic and is getting her legs wrapped.  She has noted very infrequent bouts of blood on the toilet paper but has not noted overt hematochezia.  The patient  has had a prior upper and lower endoscopy with results as below with a ring that had been previously stretched as well as diverticulosis.  GI Review of Systems Positive as above infrequent pyrosis controlled with medicine, bloating Negative for dysphagia, odynophagia, abdominal pain, melena  Review of Systems General: Denies fevers/chills/weight loss HEENT: Denies oral lesions Cardiovascular: Denies chest pain Pulmonary: Shortness of breath at baseline Gastroenterological: See HPI Genitourinary: Denies darkened urine Hematological: Denies easy bruising Endocrine: Denies temperature intolerance Psychological: Mood is stable   Medications Current Outpatient Medications  Medication Sig Dispense Refill  . acetaminophen (TYLENOL) 650 MG CR tablet Take 650 mg by mouth every 4 (four) hours as needed for pain.    Marland Kitchen amLODipine (NORVASC) 10 MG tablet Take 1 tablet (10 mg total) by mouth daily. 30 tablet 6  . antiseptic oral rinse (BIOTENE) LIQD 10 mLs by Mouth Rinse route 4 (four) times daily. (0900, 1000, 1200 & 2100)    . aspirin EC 81 MG tablet Take 1 tablet (81 mg total) by mouth daily. 90 tablet 3  . baclofen (LIORESAL) 10 MG tablet Take 10 mg by mouth 3 (three) times daily. (0900, 1400, & 2100)    . budesonide (PULMICORT) 0.5 MG/2ML nebulizer solution Take 0.5 mg by nebulization 2 (two) times daily. (1000 & 2200)    . Cholecalciferol (VITAMIN D3) 1000 units CAPS Take 1,000 Units by mouth daily. (0900)    . cloNIDine (CATAPRES) 0.1 MG tablet Take 0.1 mg by mouth 2 (two) times daily. (0900 & 2100)    .  Cyanocobalamin (VITAMIN B-12 IJ) Inject 1,000 mcg as directed every 30 (thirty) days. Weekly for 4 weeks, has 1 more dose this week (week of 7/2) then will switch to once a month     . cyclobenzaprine (FLEXERIL) 5 MG tablet Take 10 mg by mouth 2 (two) times daily. (0800 & 2100)    . donepezil (ARICEPT) 5 MG tablet Take 5 mg by mouth daily at 10 pm. (2100)    . DULoxetine (CYMBALTA) 30 MG  capsule Take 30 mg by mouth daily. (0900)    . gabapentin (NEURONTIN) 300 MG capsule Take 300 mg by mouth 2 (two) times daily. (0900 & 2100)    . HYDROcodone-acetaminophen (NORCO) 7.5-325 MG tablet Take 1 tablet by mouth every 6 (six) hours as needed.     Marland Kitchen ipratropium-albuterol (DUONEB) 0.5-2.5 (3) MG/3ML SOLN Take 3 mLs by nebulization every 6 (six) hours as needed (FOR WHEEZING/SHORTNESS OF BREATH).    Marland Kitchen lamoTRIgine (LAMICTAL) 100 MG tablet Take 100 mg by mouth 2 (two) times daily. (0900 & 2100)    . levothyroxine (SYNTHROID, LEVOTHROID) 88 MCG tablet Take 88 mcg by mouth daily before breakfast. (0900)    . Melatonin 3 MG CAPS Take 3 mg by mouth at bedtime. (2100)    . omeprazole (PRILOSEC) 20 MG capsule Take 20 mg by mouth 2 (two) times daily before a meal. (0900 & 1700)    . pravastatin (PRAVACHOL) 40 MG tablet Take 40 mg by mouth daily. (2200)    . rOPINIRole (REQUIP) 2 MG tablet Take 2 mg by mouth every 12 (twelve) hours as needed (for restless leg).    . sennosides-docusate sodium (SENOKOT-S) 8.6-50 MG tablet Take 1 tablet by mouth 2 (two) times daily as needed. (0900 & 2100)    . torsemide (DEMADEX) 20 MG tablet Take 4 tablets (80 mg total) by mouth daily. 120 tablet 3  . traZODone (DESYREL) 50 MG tablet Take 50 mg by mouth at bedtime. (2100)    . ferrous gluconate (FERGON) 324 MG tablet Take 1 tablet (324 mg total) by mouth 2 (two) times daily with a meal. 84 tablet 0   No current facility-administered medications for this visit.     Allergies No Known Allergies  Histories Past Medical History:  Diagnosis Date  . Anemia   . Anxiety   . Arthritis    "hands, feet" (03/03/2017)  . Brain lesion    2 types  . Cervical spondylosis with myelopathy   . Chest pain    Normal cardiac cath 5/09  . Chronic lower back pain   . CKD (chronic kidney disease), stage III ([place])    [name]  . Congestive heart failure (CHF) ([place])    has a Cardiomems implant   .  Constipation   . Dementia ([place])   . Depression   . Dumping syndrome   . Dyspnea    with activity  . Edema   . Fatigue   . Fibromyalgia   . GERD (gastroesophageal reflux disease)   . Headache    "w/high CBG" (03/03/2017)  . History of echocardiogram    a. Echo 03/20/16 (done at [facility] in [place], [place]):  mild LVH, EF 81%, normal diastolic function, mild LAE, MAC, RVSP 25 mmHg  . Hyperlipidemia   . Hypertension   . Hypothyroidism   . Lumbar spondylosis   . Lymphedema    seeing specialist for this  . Migraine    "mostly stopped when I changed my diet" (  03/03/2017)  . OSA on CPAP   . Pneumonia X 1  . PONV (postoperative nausea and vomiting)   . Restless leg syndrome   . Rheumatoid arthritis ([place])   . Stroke [facility])    TIAs "mini strokes"- unsure of last TIA - pt and daughter deny this  . Syncope   . Tremors of nervous system   . Type II diabetes mellitus ([place])    Past Surgical History:  Procedure Laterality Date  . APPENDECTOMY  1966  . BACK SURGERY    . CARDIAC CATHETERIZATION N/A 05/25/2016   Procedure: Right/Left Heart Cath and Coronary Angiography;  Surgeon: Larey Dresser, MD;  Location: [place] [facility];  Service: Cardiovascular;  Laterality: N/A;  . CATARACT EXTRACTION W/ INTRAOCULAR LENS  IMPLANT, BILATERAL Bilateral   . COLONOSCOPY    . CORONARY ANGIOGRAM  2009   Normal coronaries  . EXCISION/RELEASE BURSA HIP Bilateral   . I&D KNEE WITH POLY EXCHANGE Left 03/02/2017   Procedure: Left knee Revision of poly liner;  Surgeon: Mcarthur Rossetti, MD;  Location: [place];  Service: Orthopedics;  Laterality: Left;  . JOINT REPLACEMENT    . KNEE ARTHROSCOPY Bilateral   . LAPAROSCOPIC CHOLECYSTECTOMY  2015  . LUMBAR DISC SURGERY    . LUMBAR LAMINECTOMY/DECOMPRESSION MICRODISCECTOMY Left 12/2005   L2-3 laminectomy and diskectomy/notes 01/06/2011  . POSTERIOR LUMBAR FUSION  04/2006   [name] 01/06/2011; "put cages in"  . REVERSE SHOULDER ARTHROPLASTY Left 06/15/2017     Procedure: REVERSE LEFT SHOULDER ARTHROPLASTY;  Surgeon: Meredith Pel, MD;  Location: [facility];  Service: Orthopedics;  Laterality: Left;  . REVERSE SHOULDER ARTHROPLASTY Right 02/01/2018  . REVERSE SHOULDER ARTHROPLASTY Right 02/01/2018   Procedure: RIGHT REVERSE SHOULDER ARTHROPLASTY;  Surgeon: Meredith Pel, MD;  Location: [facility];  Service: Orthopedics;  Laterality: Right;  . REVISION TOTAL KNEE ARTHROPLASTY Left 03/02/2017   poly liner/notes 03/02/2017  . RIGHT HEART CATH N/A 11/13/2016   Procedure: Right Heart Cath with Cardiomems;  Surgeon: Larey Dresser, MD;  Location: [facility];  Service: Cardiovascular;  Laterality: N/A;  . SHOULDER ARTHROSCOPY WITH ROTATOR CUFF REPAIR Right   . SHOULDER OPEN ROTATOR CUFF REPAIR Left 01/2011   [name] 01/29/2011  . TOTAL ABDOMINAL HYSTERECTOMY    . TOTAL KNEE ARTHROPLASTY Left   . TOTAL KNEE ARTHROPLASTY Right 11/18/2012   Procedure: TOTAL KNEE ARTHROPLASTY;  Surgeon: Yvette Rack., MD;  Location: [facility];  Service: Orthopedics;  Laterality: Right;  . TUBAL LIGATION    . UPPER GI ENDOSCOPY     Social History   Socioeconomic History  . Marital status: Widowed    Spouse name: Not on file  . Number of children: 3  . Years of education: Not on file  . Highest education level: Not on file  Occupational History  . Occupation: retired  Scientific laboratory technician  . Financial resource strain: Not on file  . Food insecurity:    Worry: Not on file    Inability: Not on file  . Transportation needs:    Medical: Not on file    Non-medical: Not on file  Tobacco Use  . Smoking status: Never Smoker  . Smokeless tobacco: Never Used  Substance and Sexual Activity  . Alcohol use: No    Alcohol/week: 0.0 standard drinks  . Drug use: No  . Sexual activity: Not Currently  Lifestyle  . Physical activity:    Days per week: Not on file    Minutes per  session: Not on file  . Stress: Not on file  Relationships  . Social connections:    Talks on  phone: Not on file    Gets together: Not on file    Attends religious service: Not on file    Active member of club or organization: Not on file    Attends meetings of clubs or organizations: Not on file    Relationship status: Not on file  . Intimate partner violence:    Fear of current or ex partner: Not on file    Emotionally abused: Not on file    Physically abused: Not on file    Forced sexual activity: Not on file  Other Topics Concern  . Not on file  Social History Narrative   Pt does use caffeine. Lives alone. 3 children. Retired.   Family History  Problem Relation Age of Onset  . Heart disease Mother   . Heart failure Mother   . Kidney disease Mother   . Lung cancer Father   . Colon cancer Brother   . Cancer Brother        malignant thymoma  . Heart disease Brother   . Tremor Maternal Grandmother 90  . Ovarian cancer Maternal Grandmother   . Uterine cancer Maternal Grandmother   . Irritable bowel syndrome Daughter   . Esophageal cancer Neg Hx   . Stomach cancer Neg Hx   . Inflammatory bowel disease Neg Hx   . Liver disease Neg Hx   . Pancreatic cancer Neg Hx    I have reviewed her medical, social, and family history in detail and updated the electronic medical record as necessary.    PHYSICAL EXAMINATION  BP (!) 120/50   Ht 5' 6"  (1.676 m)   Wt 278 lb (126.1 kg)   BMI 44.87 kg/m  Wt Readings from Last 3 Encounters:  06/28/18 278 lb (126.1 kg)  06/16/18 278 lb 12.8 oz (126.5 kg)  01/26/18 268 lb 8 oz (121.8 kg)  GEN: NAD, appears older than stated age, appears chronically ill but nontoxic PSYCH: Cooperative, without pressured speech EYE: Conjunctivae pink, sclerae anicteric ENT: MMM, without oral ulcers NECK: Enlarged neck girth CV: Without rubs or gallops RESP: No adventitious lung sounds present GI: NABS, obese, rounded, soft, protuberant, without rebound or guarding, no HSM appreciated GU: DRE shows evidence of external hemorrhoids and potentially  internal hemorrhoids MSK/EXT: Legs wrapped due to lymphedema SKIN: No jaundice NEURO:  Alert & Oriented x 3, no focal deficits   REVIEW OF DATA  I reviewed the following data at the time of this encounter:  GI Procedures and Studies  6/16 EGD ENDOSCOPIC IMPRESSION: 1. 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 EGD was otherwise normal  6/16 Colonoscopy ENDOSCOPIC IMPRESSION: 1. Mild diverticulosis was noted in the descending colon 2. The examination was otherwise normal  Laboratory Studies  Reviewed in epic  Imaging Studies  No relevant studies   ASSESSMENT  Ms. WDahleis a 72y.o. female with a pmh significant for RA, Cervical Spondylosis, CRI, Dementia, MDD/Anxiety, GERD, HTN, HLD, lymphedema, hypothyroidism, OSA, previous TIAs, DM.  The patient is seen today for evaluation and management of:  1. Change in bowel habits   2. Rectal bleeding   3. Hemorrhoids, unspecified hemorrhoid type   4. Anal itching    This is a hemodynamically stable patient who presents for evaluation of change in bowel habits over the course of  the last few months from her nursing facility.  She is also had some very intermittent rectal bleeding which I suspect is a result of hemorrhoidal disease.  Her last colonoscopy was in 2016 so I do think it seems like it would be less likely that a new lesion has developed that would be causing overt bleeding but it is something that we should consider in our minds and in the setting of her changes in bowel habits may require additional work-up.  We will proceed with a partial work-up for the anal itching though I suspect this is a result of her hemorrhoids being inflamed.  Like to send iron studies to evaluate for the possibility of anemia that may require an endoscopic evaluation as well.  We will plan for the patient to start on FiberCon supplementation as well as MiraLAX to help the  stools continue to move appropriately.  She should be allowed to do sitz baths at least once to twice daily.  If the above work-up is unremarkable but she continues to have issues then I think it would be reasonable to consider a flexible sigmoidoscopy versus full colonoscopy for evaluation of microscopic or collagenous colitis.  She is not having any overt dysphagia symptoms and I think we can hold on an upper endoscopy unless symptoms were to recur.  I would like the patient to follow-up after these regimens have been initiated and for [place] to ensure that she does not have any pinworms that may be playing a role.  All patient questions were answered, to the best of my ability, and the patient agrees to the aforementioned plan of action with follow-up as indicated.   PLAN  1. Change in bowel habits - TSH; Future - Calcium, ionized; Future  2. Rectal bleeding - CBC; Future - IBC panel; Future - Ferritin; Future - B12 and Folate Panel; Future  3. Hemorrhoids, unspecified hemorrhoid type - Fibercon 1-2 times daily to be initiated - Miralax once daily or every other day to ensure that stools are moving appropriately - Sitz Baths once to twice daily - Continue Preparation H and Anusol -Dependent on patient's follow up will consider colonoscopy  4. Anal itching - Ova and parasite examination; Future   Orders Placed This Encounter  Procedures  . Ova and parasite examination  . CBC  . TSH  . Calcium, ionized  . IBC panel  . Ferritin  . B12 and Folate Panel    New Prescriptions   FERROUS GLUCONATE (FERGON) 324 MG TABLET    Take 1 tablet (324 mg total) by mouth 2 (two) times daily with a meal.   Modified Medications   No medications on file    Planned Follow Up: No follow-ups on file.   Justice Britain, MD [facility] Office # [phone]

## 2018-06-29 LAB — CALCIUM, IONIZED: CALCIUM ION: 5.57 mg/dL (ref 4.8–5.6)

## 2018-06-30 ENCOUNTER — Other Ambulatory Visit: Payer: Self-pay

## 2018-06-30 ENCOUNTER — Ambulatory Visit (HOSPITAL_COMMUNITY): Payer: Medicare Other | Admitting: Physical Therapy

## 2018-06-30 DIAGNOSIS — R262 Difficulty in walking, not elsewhere classified: Secondary | ICD-10-CM | POA: Diagnosis not present

## 2018-06-30 DIAGNOSIS — D509 Iron deficiency anemia, unspecified: Secondary | ICD-10-CM

## 2018-06-30 DIAGNOSIS — I890 Lymphedema, not elsewhere classified: Secondary | ICD-10-CM

## 2018-06-30 MED ORDER — FERROUS GLUCONATE 324 (38 FE) MG PO TABS: 324.0000 mg | ORAL_TABLET | Freq: Two times a day (BID) | ORAL | 0 refills | Status: DC

## 2018-06-30 NOTE — Telephone Encounter (Signed)
Iron sent to the pharmacy to be taken for 6 weeks.  Labs in [place] for 1-2 days prior to 08/05/18 1:30 pm appt.    I called and the daughter tells me the pt is at [facility] in [place].   Phone: [phone] Fax: [phone] [address] [facility] [place], [place] [address]   I have faxed the above information to that facility

## 2018-06-30 NOTE — Therapy (Signed)
[place], [place], [address] Phone: [phone]   Fax:  [phone]  Physical Therapy Treatment  Patient Details  Name: [name] MRN: [id number] Date of Birth: 1946-03-18 Referring Provider (PT): Hilbert Corrigan   Encounter Date: 06/30/2018  PT End of Session - 06/30/18 1631    Visit Number  3    Number of Visits  18    Date for PT Re-Evaluation  08/04/18    Authorization Type  medicare    PT Start Time  1400    PT Stop Time  1530    PT Time Calculation (min)  90 min    Activity Tolerance  Patient tolerated treatment well    Behavior During Therapy  [facility] for tasks assessed/performed       Past Medical History:  Diagnosis Date  . Anemia   . Anxiety   . Arthritis    "hands, feet" (03/03/2017)  . Brain lesion    2 types  . Cervical spondylosis with myelopathy   . Chest pain    Normal cardiac cath 5/09  . Chronic lower back pain   . CKD (chronic kidney disease), stage III ([place])    [name]  . Congestive heart failure (CHF) ([place])    has a Cardiomems implant   . Constipation   . Dementia ([place])   . Depression   . Dumping syndrome   . Dyspnea    with activity  . Edema   . Fatigue   . Fibromyalgia   . GERD (gastroesophageal reflux disease)   . Headache    "w/high CBG" (03/03/2017)  . History of echocardiogram    a. Echo 03/20/16 (done at [facility] in [place], [place]):  mild LVH, EF 83%, normal diastolic function, mild LAE, MAC, RVSP 25 mmHg  . Hyperlipidemia   . Hypertension   . Hypothyroidism   . Lumbar spondylosis   . Lymphedema    seeing specialist for this  . Migraine    "mostly stopped when I changed my diet" (03/03/2017)  . OSA on CPAP   . Pneumonia X 1  . PONV (postoperative nausea and vomiting)   . Restless leg syndrome   . Rheumatoid arthritis ([place])   . Stroke [facility])    TIAs "mini strokes"- unsure of last TIA - pt and daughter deny this  . Syncope   .  Tremors of nervous system   . Type II diabetes mellitus ([place])     Past Surgical History:  Procedure Laterality Date  . APPENDECTOMY  1966  . BACK SURGERY    . CARDIAC CATHETERIZATION N/A 05/25/2016   Procedure: Right/Left Heart Cath and Coronary Angiography;  Surgeon: Larey Dresser, MD;  Location: [facility];  Service: Cardiovascular;  Laterality: N/A;  . CATARACT EXTRACTION W/ INTRAOCULAR LENS  IMPLANT, BILATERAL Bilateral   . COLONOSCOPY    . CORONARY ANGIOGRAM  2009   Normal coronaries  . EXCISION/RELEASE BURSA HIP Bilateral   . I&D KNEE WITH POLY EXCHANGE Left 03/02/2017   Procedure: Left knee Revision of poly liner;  Surgeon: Mcarthur Rossetti, MD;  Location: [place];  Service: Orthopedics;  Laterality: Left;  . JOINT REPLACEMENT    . KNEE ARTHROSCOPY Bilateral   . LAPAROSCOPIC CHOLECYSTECTOMY  2015  . LUMBAR DISC SURGERY    . LUMBAR LAMINECTOMY/DECOMPRESSION MICRODISCECTOMY Left 12/2005   L2-3 laminectomy and diskectomy/notes 01/06/2011  . POSTERIOR LUMBAR FUSION  04/2006   [name]  01/06/2011; "put cages in"  . REVERSE SHOULDER ARTHROPLASTY Left 06/15/2017   Procedure: REVERSE LEFT SHOULDER ARTHROPLASTY;  Surgeon: Meredith Pel, MD;  Location: [facility];  Service: Orthopedics;  Laterality: Left;  . REVERSE SHOULDER ARTHROPLASTY Right 02/01/2018  . REVERSE SHOULDER ARTHROPLASTY Right 02/01/2018   Procedure: RIGHT REVERSE SHOULDER ARTHROPLASTY;  Surgeon: Meredith Pel, MD;  Location: [facility];  Service: Orthopedics;  Laterality: Right;  . REVISION TOTAL KNEE ARTHROPLASTY Left 03/02/2017   poly liner/notes 03/02/2017  . RIGHT HEART CATH N/A 11/13/2016   Procedure: Right Heart Cath with Cardiomems;  Surgeon: Larey Dresser, MD;  Location:  CV LAB;  Service: Cardiovascular;  Laterality: N/A;  . SHOULDER ARTHROSCOPY WITH ROTATOR CUFF REPAIR Right   . SHOULDER OPEN ROTATOR CUFF REPAIR Left 01/2011   [name] 01/29/2011  . TOTAL ABDOMINAL HYSTERECTOMY    . TOTAL  KNEE ARTHROPLASTY Left   . TOTAL KNEE ARTHROPLASTY Right 11/18/2012   Procedure: TOTAL KNEE ARTHROPLASTY;  Surgeon: Yvette Rack., MD;  Location: [facility];  Service: Orthopedics;  Laterality: Right;  . TUBAL LIGATION    . UPPER GI ENDOSCOPY      There were no vitals filed for this visit.  Subjective Assessment - 06/30/18 1629    Subjective  Pt states she is eager to begin bandaging both legs.  States she wants to come everyday.    Currently in Pain?  No/denies            LYMPHEDEMA/ONCOLOGY QUESTIONNAIRE - 06/30/18 1630      Lymphedema Assessments   Lymphedema Assessments  Lower extremities      Right Lower Extremity Lymphedema   20 cm Proximal to Suprapatella  70 cm    10 cm Proximal to Suprapatella  66.5 cm    At Midpatella/Popliteal Crease  57 cm    30 cm Proximal to Floor at Lateral Plantar Foot  53.5 cm    20 cm Proximal to Floor at Lateral Plantar Foot  49._0 cm Proximal to Floor at Lateral Malleoli  36.7 cm    Circumference of ankle/heel  36.5 cm.    5 cm Proximal to 1st MTP Joint  23.3 cm    Across MTP Joint  22.5 cm    Around Proximal Great Toe  7.4 cm      Left Lower Extremity Lymphedema   20 cm Proximal to Suprapatella  73.5 cm    10 cm Proximal to Suprapatella  69 cm    At Midpatella/Popliteal Crease  56 cm    30 cm Proximal to Floor at Lateral Plantar Foot  61 cm    20 cm Proximal to Floor at Lateral Plantar Foot  50 cm    10 cm Proximal to Floor at Lateral Malleoli  39 cm    Circumference of ankle/heel  37.5 cm.    5 cm Proximal to 1st MTP Joint  23.5 cm    Across MTP Joint  22.3 cm    Around Proximal Great Toe  7.5 cm                [facility] Adult PT Treatment/Exercise - 06/30/18 0001      Manual Therapy   Manual Therapy  Manual Lymphatic Drainage (MLD);Compression Bandaging    Manual therapy comments  completed seperate from all other aspects of treatment.     Manual Lymphatic Drainage (MLD)  for bilateral LE's in anterior only due to  time    Compression  Bandaging  for bilateral LE's full LE"s using 1/2" foam, multilayer short stretch               PT Short Term Goals - 06/23/18 1555      PT SHORT TERM GOAL #1   Title  Pt to reduce LE edma by an average of 3 cm to reduce risk of cellulitis.     Time  3    Period  Weeks    Status  New    Target Date  07/14/18      PT SHORT TERM GOAL #2   Title  PT to be able to walk for over 500 ft with her walker without being fatiqued to maintain I at nursing facility.     Time  3    Period  Weeks    Status  New        PT Long Term Goals - 06/23/18 1556      PT LONG TERM GOAL #1   Title  Pt to reduce LE volume on an average of 6 cm to allow pt to be able to don her compression garment in comfort.     Time  6    Period  Weeks    Status  New    Target Date  08/04/18      PT LONG TERM GOAL #2   Title  PT to be able to don her pant and shoes with greater ease to be able to be I in dressing     Time  6    Period  Weeks    Status  New      PT LONG TERM GOAL #3   Title  Pt to be able to ambulate with her walker for over 800 ft to allow pt to go on community outings with her family.     Time  6    Period  Weeks    Status  New            Plan - 06/30/18 1632    Clinical Impression Statement  Manual lymph drainage completed anterior only this session.  Measured LE's with noted decompression in all areas.  Began bandaging to thighs bilaterally this session.  Pt reported overall comfort at end of treatment with full wraps.      Rehab Potential  Good    PT Frequency  3x / week    PT Duration  6 weeks    PT Treatment/Interventions  Compression bandaging;Manual lymph drainage;ADLs/Self Care Home Management;Therapeutic exercise    PT Next Visit Plan  Follow up on pump sleeves, if they have returned her call. continue compression bandaging to thigh level.  measure on wednesdays.;        Patient will benefit from skilled therapeutic intervention in order to  improve the following deficits and impairments:  Decreased activity tolerance, Difficulty walking, Increased edema, Decreased strength  Visit Diagnosis: Difficulty in walking, not elsewhere classified  Lymphedema, not elsewhere classified     Problem List Patient Active Problem List   Diagnosis Date Noted  . Rotator cuff arthropathy of right shoulder   . Shoulder arthritis 06/15/2017  . Status post revision of total knee replacement, left 04/29/2017  . Polyethylene liner wear following left total knee arthroplasty requiring isolated polyethylene liner exchange ([place]) 03/02/2017  . 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 Unstable angina ([place]) 05/21/2016  . Chronic diastolic heart failure ([place]) 03/29/2016  . Normal coronary arteries 2009 01/14/2015  .  Obesity-BMI 40 01/14/2015  . Restless leg 01/14/2015  . Chest pain 01/14/2015  . Back pain 01/14/2015  . Renal insufficiency 01/14/2015  . Dementia- mild memory issues 01/14/2015  . Occult blood positive stool 12/14/2014  . Anemia 12/14/2014  . Family history of colon cancer 12/14/2014  . Change in bowel habits 12/14/2014  . GERD (gastroesophageal reflux disease) 12/14/2014  . Dyspnea 05/17/2012  . Lymphedema 05/17/2012  . Weight gain 05/17/2012  . HTN (hypertension) 05/17/2012   [name], PTA/CLT [phone]  [name] 06/30/2018, 4:34 PM  [facility] [address] [place], [place], [address] Phone: [phone]   Fax:  [phone]  Name: SABRA SESSLER MRN: [id number] Date of Birth: 1945/10/01

## 2018-07-01 ENCOUNTER — Ambulatory Visit (HOSPITAL_COMMUNITY): Payer: Medicare Other | Admitting: Physical Therapy

## 2018-07-01 DIAGNOSIS — R262 Difficulty in walking, not elsewhere classified: Secondary | ICD-10-CM

## 2018-07-01 DIAGNOSIS — I890 Lymphedema, not elsewhere classified: Secondary | ICD-10-CM

## 2018-07-01 DIAGNOSIS — G8929 Other chronic pain: Secondary | ICD-10-CM

## 2018-07-01 DIAGNOSIS — M25562 Pain in left knee: Secondary | ICD-10-CM

## 2018-07-01 NOTE — Therapy (Signed)
[place], [place], [address] Phone: [phone]   Fax:  [phone]  Physical Therapy Treatment  Patient Details  Name: [name] MRN: [id number] Date of Birth: 11-16-1945 Referring Provider (PT): Hilbert Corrigan   Encounter Date: 07/01/2018  PT End of Session - 07/01/18 1549    Visit Number  4    Number of Visits  18    Date for PT Re-Evaluation  08/04/18    Authorization Type  medicare    PT Start Time  1355    PT Stop Time  1525    PT Time Calculation (min)  90 min    Activity Tolerance  Patient tolerated treatment well    Behavior During Therapy  [facility] for tasks assessed/performed       Past Medical History:  Diagnosis Date  . Anemia   . Anxiety   . Arthritis    "hands, feet" (03/03/2017)  . Brain lesion    2 types  . Cervical spondylosis with myelopathy   . Chest pain    Normal cardiac cath 5/09  . Chronic lower back pain   . CKD (chronic kidney disease), stage III ([place])    [name]  . Congestive heart failure (CHF) ([place])    has a Cardiomems implant   . Constipation   . Dementia ([place])   . Depression   . Dumping syndrome   . Dyspnea    with activity  . Edema   . Fatigue   . Fibromyalgia   . GERD (gastroesophageal reflux disease)   . Headache    "w/high CBG" (03/03/2017)  . History of echocardiogram    a. Echo 03/20/16 (done at [facility] in [place], [place]):  mild LVH, EF 78%, normal diastolic function, mild LAE, MAC, RVSP 25 mmHg  . Hyperlipidemia   . Hypertension   . Hypothyroidism   . Lumbar spondylosis   . Lymphedema    seeing specialist for this  . Migraine    "mostly stopped when I changed my diet" (03/03/2017)  . OSA on CPAP   . Pneumonia X 1  . PONV (postoperative nausea and vomiting)   . Restless leg syndrome   . Rheumatoid arthritis ([place])   . Stroke [facility])    TIAs "mini strokes"- unsure of last TIA - pt and daughter deny this  . Syncope   .  Tremors of nervous system   . Type II diabetes mellitus ([place])     Past Surgical History:  Procedure Laterality Date  . APPENDECTOMY  1966  . BACK SURGERY    . CARDIAC CATHETERIZATION N/A 05/25/2016   Procedure: Right/Left Heart Cath and Coronary Angiography;  Surgeon: Larey Dresser, MD;  Location: [facility];  Service: Cardiovascular;  Laterality: N/A;  . CATARACT EXTRACTION W/ INTRAOCULAR LENS  IMPLANT, BILATERAL Bilateral   . COLONOSCOPY    . CORONARY ANGIOGRAM  2009   Normal coronaries  . EXCISION/RELEASE BURSA HIP Bilateral   . I&D KNEE WITH POLY EXCHANGE Left 03/02/2017   Procedure: Left knee Revision of poly liner;  Surgeon: Mcarthur Rossetti, MD;  Location: [facility];  Service: Orthopedics;  Laterality: Left;  . JOINT REPLACEMENT    . KNEE ARTHROSCOPY Bilateral   . LAPAROSCOPIC CHOLECYSTECTOMY  2015  . LUMBAR DISC SURGERY    . LUMBAR LAMINECTOMY/DECOMPRESSION MICRODISCECTOMY Left 12/2005   L2-3 laminectomy and diskectomy/notes 01/06/2011  . POSTERIOR LUMBAR FUSION  04/2006   [name]  01/06/2011; "put cages in"  . REVERSE SHOULDER ARTHROPLASTY Left 06/15/2017   Procedure: REVERSE LEFT SHOULDER ARTHROPLASTY;  Surgeon: Meredith Pel, MD;  Location: [facility];  Service: Orthopedics;  Laterality: Left;  . REVERSE SHOULDER ARTHROPLASTY Right 02/01/2018  . REVERSE SHOULDER ARTHROPLASTY Right 02/01/2018   Procedure: RIGHT REVERSE SHOULDER ARTHROPLASTY;  Surgeon: Meredith Pel, MD;  Location: [facility];  Service: Orthopedics;  Laterality: Right;  . REVISION TOTAL KNEE ARTHROPLASTY Left 03/02/2017   poly liner/notes 03/02/2017  . RIGHT HEART CATH N/A 11/13/2016   Procedure: Right Heart Cath with Cardiomems;  Surgeon: Larey Dresser, MD;  Location: [facility];  Service: Cardiovascular;  Laterality: N/A;  . SHOULDER ARTHROSCOPY WITH ROTATOR CUFF REPAIR Right   . SHOULDER OPEN ROTATOR CUFF REPAIR Left 01/2011   [name] 01/29/2011  . TOTAL ABDOMINAL HYSTERECTOMY    . TOTAL  KNEE ARTHROPLASTY Left   . TOTAL KNEE ARTHROPLASTY Right 11/18/2012   Procedure: TOTAL KNEE ARTHROPLASTY;  Surgeon: Yvette Rack., MD;  Location: [facility];  Service: Orthopedics;  Laterality: Right;  . TUBAL LIGATION    . UPPER GI ENDOSCOPY      There were no vitals filed for this visit.  Subjective Assessment - 07/01/18 1547    Subjective  PT states she got her schedule with everyday treatment for the next couple weeks. States she kept her bandages on until 4am and got her bath.  No pain or issues currently.      Currently in Pain?  No/denies                       [facility] Adult PT Treatment/Exercise - 07/01/18 0001      Manual Therapy   Manual Therapy  Manual Lymphatic Drainage (MLD);Compression Bandaging    Manual therapy comments  completed seperate from all other aspects of treatment.     Manual Lymphatic Drainage (MLD)  for bilateral LE's in anterior only due to time    Compression Bandaging  for bilateral LE's full LE"s using 1/2" foam, multilayer short stretch               PT Short Term Goals - 06/23/18 1555      PT SHORT TERM GOAL #1   Title  Pt to reduce LE edma by an average of 3 cm to reduce risk of cellulitis.     Time  3    Period  Weeks    Status  New    Target Date  07/14/18      PT SHORT TERM GOAL #2   Title  PT to be able to walk for over 500 ft with her walker without being fatiqued to maintain I at nursing facility.     Time  3    Period  Weeks    Status  New        PT Long Term Goals - 06/23/18 1556      PT LONG TERM GOAL #1   Title  Pt to reduce LE volume on an average of 6 cm to allow pt to be able to don her compression garment in comfort.     Time  6    Period  Weeks    Status  New    Target Date  08/04/18      PT LONG TERM GOAL #2   Title  PT to be able to don her pant and shoes with greater ease to be able to be  I in dressing     Time  6    Period  Weeks    Status  New      PT LONG TERM GOAL #3   Title  Pt to be  able to ambulate with her walker for over 800 ft to allow pt to go on community outings with her family.     Time  6    Period  Weeks    Status  New            Plan - 07/01/18 1550    Clinical Impression Statement  contiued with manual lymph draiange to bilaterla LE's  Pt rereported overall comfort with bandaging other than 2 of the thigh foam peices were too large, needing to be cut down.  Able to cut peices to increased comfort.  Manual, LE moisturizing and bandaging completed.  Pt reported overall comfort.  will check on pump progress when evaluating therapist returns.      Rehab Potential  Good    PT Frequency  3x / week    PT Duration  6 weeks    PT Treatment/Interventions  Compression bandaging;Manual lymph drainage;ADLs/Self Care Home Management;Therapeutic exercise    PT Next Visit Plan  Follow up on pump sleeves.  continue compression bandaging to thigh level.  measure on wednesdays.;        Patient will benefit from skilled therapeutic intervention in order to improve the following deficits and impairments:  Decreased activity tolerance, Difficulty walking, Increased edema, Decreased strength  Visit Diagnosis: Difficulty in walking, not elsewhere classified  Lymphedema, not elsewhere classified  Chronic pain of left knee     Problem List Patient Active Problem List   Diagnosis Date Noted  . Rotator cuff arthropathy of right shoulder   . Shoulder arthritis 06/15/2017  . Status post revision of total knee replacement, left 04/29/2017  . Polyethylene liner wear following left total knee arthroplasty requiring isolated polyethylene liner exchange ([place]) 03/02/2017  . 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 Unstable angina ([place]) 05/21/2016  . Chronic diastolic heart failure ([place]) 03/29/2016  . Normal coronary arteries 2009 01/14/2015  . Obesity-BMI 40 01/14/2015  . Restless leg 01/14/2015  . Chest pain 01/14/2015  . Back pain 01/14/2015   . Renal insufficiency 01/14/2015  . Dementia- mild memory issues 01/14/2015  . Occult blood positive stool 12/14/2014  . Anemia 12/14/2014  . Family history of colon cancer 12/14/2014  . Change in bowel habits 12/14/2014  . GERD (gastroesophageal reflux disease) 12/14/2014  . Dyspnea 05/17/2012  . Lymphedema 05/17/2012  . Weight gain 05/17/2012  . HTN (hypertension) 05/17/2012   [name], PTA/CLT [phone]  [name] 07/01/2018, 3:52 PM  [facility] [address] [place], [place], [address] Phone: [phone]   Fax:  [phone]  Name: AVALYNN BOWE MRN: [id number] Date of Birth: 08-Apr-1946

## 2018-07-03 ENCOUNTER — Encounter: Payer: Self-pay | Admitting: Gastroenterology

## 2018-07-04 ENCOUNTER — Ambulatory Visit (HOSPITAL_COMMUNITY): Payer: Medicare Other | Admitting: Physical Therapy

## 2018-07-04 DIAGNOSIS — R262 Difficulty in walking, not elsewhere classified: Secondary | ICD-10-CM

## 2018-07-04 DIAGNOSIS — I890 Lymphedema, not elsewhere classified: Secondary | ICD-10-CM

## 2018-07-04 NOTE — Therapy (Signed)
[place], [place], [address] Phone: [phone]   Fax:  [phone]  Physical Therapy Treatment  Patient Details  Name: [name] MRN: [id number] Date of Birth: 08/04/1946 Referring Provider (PT): Hilbert Corrigan   Encounter Date: 07/04/2018  PT End of Session - 07/04/18 1211    Visit Number  5    Number of Visits  18    Date for PT Re-Evaluation  08/04/18    Authorization Type  medicare    PT Start Time  1055    PT Stop Time  1155    PT Time Calculation (min)  60 min    Activity Tolerance  Patient tolerated treatment well    Behavior During Therapy  [facility] for tasks assessed/performed       Past Medical History:  Diagnosis Date  . Anemia   . Anxiety   . Arthritis    "hands, feet" (03/03/2017)  . Brain lesion    2 types  . Cervical spondylosis with myelopathy   . Chest pain    Normal cardiac cath 5/09  . Chronic lower back pain   . CKD (chronic kidney disease), stage III ([place])    [name]  . Congestive heart failure (CHF) ([place])    has a Cardiomems implant   . Constipation   . Dementia ([place])   . Depression   . Dumping syndrome   . Dyspnea    with activity  . Edema   . Fatigue   . Fibromyalgia   . GERD (gastroesophageal reflux disease)   . Headache    "w/high CBG" (03/03/2017)  . History of echocardiogram    a. Echo 03/20/16 (done at [facility] in [place], [place]):  mild LVH, EF 72%, normal diastolic function, mild LAE, MAC, RVSP 25 mmHg  . Hyperlipidemia   . Hypertension   . Hypothyroidism   . Lumbar spondylosis   . Lymphedema    seeing specialist for this  . Migraine    "mostly stopped when I changed my diet" (03/03/2017)  . OSA on CPAP   . Pneumonia X 1  . PONV (postoperative nausea and vomiting)   . Restless leg syndrome   . Rheumatoid arthritis ([place])   . Stroke [facility])    TIAs "mini strokes"- unsure of last TIA - pt and daughter deny this  . Syncope    . Tremors of nervous system   . Type II diabetes mellitus ([place])     Past Surgical History:  Procedure Laterality Date  . APPENDECTOMY  1966  . BACK SURGERY    . CARDIAC CATHETERIZATION N/A 05/25/2016   Procedure: Right/Left Heart Cath and Coronary Angiography;  Surgeon: Larey Dresser, MD;  Location: [place] [facility];  Service: Cardiovascular;  Laterality: N/A;  . CATARACT EXTRACTION W/ INTRAOCULAR LENS  IMPLANT, BILATERAL Bilateral   . COLONOSCOPY    . CORONARY ANGIOGRAM  2009   Normal coronaries  . EXCISION/RELEASE BURSA HIP Bilateral   . I&D KNEE WITH POLY EXCHANGE Left 03/02/2017   Procedure: Left knee Revision of poly liner;  Surgeon: Mcarthur Rossetti, MD;  Location: [place];  Service: Orthopedics;  Laterality: Left;  . JOINT REPLACEMENT    . KNEE ARTHROSCOPY Bilateral   . LAPAROSCOPIC CHOLECYSTECTOMY  2015  . LUMBAR DISC SURGERY    . LUMBAR LAMINECTOMY/DECOMPRESSION MICRODISCECTOMY Left 12/2005   L2-3 laminectomy and diskectomy/notes 01/06/2011  . POSTERIOR LUMBAR FUSION  04/2006   [name]  01/06/2011; "put cages in"  . REVERSE SHOULDER ARTHROPLASTY Left 06/15/2017   Procedure: REVERSE LEFT SHOULDER ARTHROPLASTY;  Surgeon: Meredith Pel, MD;  Location: [place];  Service: Orthopedics;  Laterality: Left;  . REVERSE SHOULDER ARTHROPLASTY Right 02/01/2018  . REVERSE SHOULDER ARTHROPLASTY Right 02/01/2018   Procedure: RIGHT REVERSE SHOULDER ARTHROPLASTY;  Surgeon: Meredith Pel, MD;  Location: [place];  Service: Orthopedics;  Laterality: Right;  . REVISION TOTAL KNEE ARTHROPLASTY Left 03/02/2017   poly liner/notes 03/02/2017  . RIGHT HEART CATH N/A 11/13/2016   Procedure: Right Heart Cath with Cardiomems;  Surgeon: Larey Dresser, MD;  Location: [place] [facility];  Service: Cardiovascular;  Laterality: N/A;  . SHOULDER ARTHROSCOPY WITH ROTATOR CUFF REPAIR Right   . SHOULDER OPEN ROTATOR CUFF REPAIR Left 01/2011   [name] 01/29/2011  . TOTAL ABDOMINAL HYSTERECTOMY    .  TOTAL KNEE ARTHROPLASTY Left   . TOTAL KNEE ARTHROPLASTY Right 11/18/2012   Procedure: TOTAL KNEE ARTHROPLASTY;  Surgeon: Yvette Rack., MD;  Location: [place];  Service: Orthopedics;  Laterality: Right;  . TUBAL LIGATION    . UPPER GI ENDOSCOPY      There were no vitals filed for this visit.                    [place] Adult PT Treatment/Exercise - 07/04/18 0001      Manual Therapy   Manual Therapy  Compression Bandaging    Manual therapy comments  completed seperate from all other aspects of treatment.     Compression Bandaging  for bilateral LE's full LE"s using 1/2" foam, multilayer short stretch               PT Short Term Goals - 06/23/18 1555      PT SHORT TERM GOAL #1   Title  Pt to reduce LE edma by an average of 3 cm to reduce risk of cellulitis.     Time  3    Period  Weeks    Status  New    Target Date  07/14/18      PT SHORT TERM GOAL #2   Title  PT to be able to walk for over 500 ft with her walker without being fatiqued to maintain I at nursing facility.     Time  3    Period  Weeks    Status  New        PT Long Term Goals - 06/23/18 1556      PT LONG TERM GOAL #1   Title  Pt to reduce LE volume on an average of 6 cm to allow pt to be able to don her compression garment in comfort.     Time  6    Period  Weeks    Status  New    Target Date  08/04/18      PT LONG TERM GOAL #2   Title  PT to be able to don her pant and shoes with greater ease to be able to be I in dressing     Time  6    Period  Weeks    Status  New      PT LONG TERM GOAL #3   Title  Pt to be able to ambulate with her walker for over 800 ft to allow pt to go on community outings with her family.     Time  6    Period  Weeks  Status  New            Plan - 07/04/18 1211    Clinical Impression Statement  Pt late for appt due to transportation issues.  did not have time to complete full manual lymph drainage. Moisturized well and rebandaged using  multilayer short stretch.  insructed pateint to remove bandages in am prior to next appt.  pt verbalized understanding.      Rehab Potential  Good    PT Frequency  3x / week    PT Duration  6 weeks    PT Treatment/Interventions  Compression bandaging;Manual lymph drainage;ADLs/Self Care Home Management;Therapeutic exercise    PT Next Visit Plan  Follow up on pump sleeves.  continue compression bandaging to thigh level.  measure on wednesdays.;        Patient will benefit from skilled therapeutic intervention in order to improve the following deficits and impairments:  Decreased activity tolerance, Difficulty walking, Increased edema, Decreased strength  Visit Diagnosis: Difficulty in walking, not elsewhere classified  Lymphedema, not elsewhere classified     Problem List Patient Active Problem List   Diagnosis Date Noted  . Rotator cuff arthropathy of right shoulder   . Shoulder arthritis 06/15/2017  . Status post revision of total knee replacement, left 04/29/2017  . Polyethylene liner wear following left total knee arthroplasty requiring isolated polyethylene liner exchange ([place]) 03/02/2017  . 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 Unstable angina ([place]) 05/21/2016  . Chronic diastolic heart failure ([place]) 03/29/2016  . Normal coronary arteries 2009 01/14/2015  . Obesity-BMI 40 01/14/2015  . Restless leg 01/14/2015  . Chest pain 01/14/2015  . Back pain 01/14/2015  . Renal insufficiency 01/14/2015  . Dementia- mild memory issues 01/14/2015  . Occult blood positive stool 12/14/2014  . Anemia 12/14/2014  . Family history of colon cancer 12/14/2014  . Change in bowel habits 12/14/2014  . GERD (gastroesophageal reflux disease) 12/14/2014  . Dyspnea 05/17/2012  . Lymphedema 05/17/2012  . Weight gain 05/17/2012  . HTN (hypertension) 05/17/2012   [name], PTA/CLT [phone]  [name] 07/04/2018, 12:15 PM  [place], [place], [address] Phone: [phone]   Fax:  [phone]  Name: [name] MRN: [id number] Date of Birth: [birth year]

## 2018-07-05 ENCOUNTER — Ambulatory Visit (HOSPITAL_COMMUNITY): Payer: Medicare Other | Admitting: Physical Therapy

## 2018-07-05 ENCOUNTER — Telehealth (HOSPITAL_COMMUNITY): Payer: Self-pay | Admitting: Internal Medicine

## 2018-07-05 DIAGNOSIS — R262 Difficulty in walking, not elsewhere classified: Secondary | ICD-10-CM

## 2018-07-05 DIAGNOSIS — K625 Hemorrhage of anus and rectum: Secondary | ICD-10-CM | POA: Insufficient documentation

## 2018-07-05 DIAGNOSIS — L290 Pruritus ani: Secondary | ICD-10-CM | POA: Insufficient documentation

## 2018-07-05 DIAGNOSIS — K649 Unspecified hemorrhoids: Secondary | ICD-10-CM | POA: Insufficient documentation

## 2018-07-05 DIAGNOSIS — I890 Lymphedema, not elsewhere classified: Secondary | ICD-10-CM

## 2018-07-05 NOTE — Telephone Encounter (Signed)
07/05/18  [facility] called because [employer] wanted to know if we could see patient later today... They couldn't get her here this morning.... There was a 1:45 on cindy's schedule so I rescheduled the patient to that time.... I spoke with Lamount Cohen from [facility]

## 2018-07-05 NOTE — Therapy (Signed)
[place], [place], [address] Phone: [phone]   Fax:  [phone]  Physical Therapy Treatment  Patient Details  Name: [name] MRN: [id number] Date of Birth: [birth year] Referring Provider (PT): Hilbert Corrigan   Encounter Date: 07/05/2018  PT End of Session - 07/05/18 1628    Visit Number  6    Number of Visits  18    Date for PT Re-Evaluation  08/04/18    Authorization Type  medicare    PT Start Time  1355    PT Stop Time  1525    PT Time Calculation (min)  90 min    Activity Tolerance  Patient tolerated treatment well    Behavior During Therapy  [facility] for tasks assessed/performed       Past Medical History:  Diagnosis Date  . Anemia   . Anxiety   . Arthritis    "hands, feet" (03/03/2017)  . Brain lesion    2 types  . Cervical spondylosis with myelopathy   . Chest pain    Normal cardiac cath 5/09  . Chronic lower back pain   . CKD (chronic kidney disease), stage III ([place])    [name]  . Congestive heart failure (CHF) ([place])    has a Cardiomems implant   . Constipation   . Dementia ([place])   . Depression   . Dumping syndrome   . Dyspnea    with activity  . Edema   . Fatigue   . Fibromyalgia   . GERD (gastroesophageal reflux disease)   . Headache    "w/high CBG" (03/03/2017)  . History of echocardiogram    a. Echo 03/20/16 (done at [facility] in [place], [place]):  mild LVH, EF 69%, normal diastolic function, mild LAE, MAC, RVSP 25 mmHg  . Hyperlipidemia   . Hypertension   . Hypothyroidism   . Lumbar spondylosis   . Lymphedema    seeing specialist for this  . Migraine    "mostly stopped when I changed my diet" (03/03/2017)  . OSA on CPAP   . Pneumonia X 1  . PONV (postoperative nausea and vomiting)   . Restless leg syndrome   . Rheumatoid arthritis ([place])   . Stroke [facility])    TIAs "mini strokes"- unsure of last TIA - pt and daughter deny this  . Syncope    . Tremors of nervous system   . Type II diabetes mellitus ([place])     Past Surgical History:  Procedure Laterality Date  . APPENDECTOMY  1966  . BACK SURGERY    . CARDIAC CATHETERIZATION N/A 05/25/2016   Procedure: Right/Left Heart Cath and Coronary Angiography;  Surgeon: Larey Dresser, MD;  Location: [place] [facility];  Service: Cardiovascular;  Laterality: N/A;  . CATARACT EXTRACTION W/ INTRAOCULAR LENS  IMPLANT, BILATERAL Bilateral   . COLONOSCOPY    . CORONARY ANGIOGRAM  2009   Normal coronaries  . EXCISION/RELEASE BURSA HIP Bilateral   . I&D KNEE WITH POLY EXCHANGE Left 03/02/2017   Procedure: Left knee Revision of poly liner;  Surgeon: Mcarthur Rossetti, MD;  Location: [place];  Service: Orthopedics;  Laterality: Left;  . JOINT REPLACEMENT    . KNEE ARTHROSCOPY Bilateral   . LAPAROSCOPIC CHOLECYSTECTOMY  2015  . LUMBAR DISC SURGERY    . LUMBAR LAMINECTOMY/DECOMPRESSION MICRODISCECTOMY Left 12/2005   L2-3 laminectomy and diskectomy/notes 01/06/2011  . POSTERIOR LUMBAR FUSION  04/2006   [name]  01/06/2011; "put cages in"  . REVERSE SHOULDER ARTHROPLASTY Left 06/15/2017   Procedure: REVERSE LEFT SHOULDER ARTHROPLASTY;  Surgeon: Meredith Pel, MD;  Location: [place];  Service: Orthopedics;  Laterality: Left;  . REVERSE SHOULDER ARTHROPLASTY Right 02/01/2018  . REVERSE SHOULDER ARTHROPLASTY Right 02/01/2018   Procedure: RIGHT REVERSE SHOULDER ARTHROPLASTY;  Surgeon: Meredith Pel, MD;  Location: [facility];  Service: Orthopedics;  Laterality: Right;  . REVISION TOTAL KNEE ARTHROPLASTY Left 03/02/2017   poly liner/notes 03/02/2017  . RIGHT HEART CATH N/A 11/13/2016   Procedure: Right Heart Cath with Cardiomems;  Surgeon: Larey Dresser, MD;  Location: [place] [facility];  Service: Cardiovascular;  Laterality: N/A;  . SHOULDER ARTHROSCOPY WITH ROTATOR CUFF REPAIR Right   . SHOULDER OPEN ROTATOR CUFF REPAIR Left 01/2011   [name] 01/29/2011  . TOTAL ABDOMINAL HYSTERECTOMY    .  TOTAL KNEE ARTHROPLASTY Left   . TOTAL KNEE ARTHROPLASTY Right 11/18/2012   Procedure: TOTAL KNEE ARTHROPLASTY;  Surgeon: Yvette Rack., MD;  Location: [place];  Service: Orthopedics;  Laterality: Right;  . TUBAL LIGATION    . UPPER GI ENDOSCOPY      There were no vitals filed for this visit.  Subjective Assessment - 07/05/18 1625    Subjective  PT states that she called in about what type of pump she had but therapist never recieved the message, therapist requested pt to call again with the information so we can attempt to get sleeves for her pump as hers has ripped     Pertinent History  B lymphedema, TSR, TKR, CHF, CKD, OA     How long can you sit comfortably?  no problem     How long can you walk comfortably?  Able to walk with a walker she was walking quite a bit but since her legs have gotten so big she can only go for about 250 feet at a time due to her leg feeling like it ways 2000 lb.     Patient Stated Goals  legs to get smaller to be able to wear compression     Currently in Pain?  Yes    Pain Score  5     Pain Location  Knee    Pain Orientation  Right    Pain Descriptors / Indicators  Aching    Pain Type  Chronic pain    Pain Onset  More than a month ago    Pain Frequency  Intermittent    Aggravating Factors   weather and walking     Pain Relieving Factors  heat     Effect of Pain on Daily Activities  limits            LYMPHEDEMA/ONCOLOGY QUESTIONNAIRE - 07/05/18 1432      Right Lower Extremity Lymphedema   20 cm Proximal to Suprapatella  69.8 cm    10 cm Proximal to Suprapatella  65.9 cm    At Midpatella/Popliteal Crease  56.7 cm    30 cm Proximal to Floor at Lateral Plantar Foot  55 cm    20 cm Proximal to Floor at Lateral Plantar Foot  48 1    10 cm Proximal to Floor at Lateral Malleoli  39 cm    Circumference of ankle/heel  36.5 cm.    5 cm Proximal to 1st MTP Joint  24 cm    Across MTP Joint  24 cm    Around Proximal Great Toe  8.2 cm  Left Lower  Extremity Lymphedema   20 cm Proximal to Suprapatella  75 cm    10 cm Proximal to Suprapatella  68 cm    At Midpatella/Popliteal Crease  57 cm    30 cm Proximal to Floor at Lateral Plantar Foot  57 cm    20 cm Proximal to Floor at Lateral Plantar Foot  49 cm    10 cm Proximal to Floor at Lateral Malleoli  42 cm    Circumference of ankle/heel  39 cm.    5 cm Proximal to 1st MTP Joint  23.5 cm    Across MTP Joint  22.5 cm    Around Proximal Great Toe  8.3 cm                [facility] Adult PT Treatment/Exercise - 07/05/18 0001      Manual Therapy   Manual Therapy  Manual Lymphatic Drainage (MLD);Compression Bandaging    Manual therapy comments  completed seperate from all other aspects of treatment.     Manual Lymphatic Drainage (MLD)  supraclavicular, deep and superfical abdominal, routing using inguinal/axillary anastomosis and B LE anterior only due to time restraints.     Compression Bandaging  for bilateral LE's full LE"s using 1/2" foam, multilayer short stretch               PT Short Term Goals - 06/23/18 1555      PT SHORT TERM GOAL #1   Title  Pt to reduce LE edma by an average of 3 cm to reduce risk of cellulitis.     Time  3    Period  Weeks    Status  New    Target Date  07/14/18      PT SHORT TERM GOAL #2   Title  PT to be able to walk for over 500 ft with her walker without being fatiqued to maintain I at nursing facility.     Time  3    Period  Weeks    Status  New        PT Long Term Goals - 06/23/18 1556      PT LONG TERM GOAL #1   Title  Pt to reduce LE volume on an average of 6 cm to allow pt to be able to don her compression garment in comfort.     Time  6    Period  Weeks    Status  New    Target Date  08/04/18      PT LONG TERM GOAL #2   Title  PT to be able to don her pant and shoes with greater ease to be able to be I in dressing     Time  6    Period  Weeks    Status  New      PT LONG TERM GOAL #3   Title  Pt to be able to  ambulate with her walker for over 800 ft to allow pt to go on community outings with her family.     Time  6    Period  Weeks    Status  New            Plan - 07/05/18 1628    Clinical Impression Statement  PT remeasured with small reductions noted in Rt LE while Lt LE had small increase.  No induration noted with manual. Pt encouraged to walk in bandages.     Rehab Potential  Good  PT Frequency  3x / week    PT Duration  6 weeks    PT Treatment/Interventions  Compression bandaging;Manual lymph drainage;ADLs/Self Care Home Management;Therapeutic exercise    PT Next Visit Plan  Follow up on pump sleeves.  continue compression bandaging to thigh level.  measure on wednesdays.;        Patient will benefit from skilled therapeutic intervention in order to improve the following deficits and impairments:  Decreased activity tolerance, Difficulty walking, Increased edema, Decreased strength  Visit Diagnosis: Difficulty in walking, not elsewhere classified  Lymphedema, not elsewhere classified     Problem List Patient Active Problem List   Diagnosis Date Noted  . Rectal bleeding 07/05/2018  . Hemorrhoids 07/05/2018  . Anal itching 07/05/2018  . Rotator cuff arthropathy of right shoulder   . Shoulder arthritis 06/15/2017  . Status post revision of total knee replacement, left 04/29/2017  . Polyethylene liner wear following left total knee arthroplasty requiring isolated polyethylene liner exchange ([employer]) 03/02/2017  . 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 Unstable angina ([place]) 05/21/2016  . Chronic diastolic heart failure ([place]) 03/29/2016  . Normal coronary arteries 2009 01/14/2015  . Obesity-BMI 40 01/14/2015  . Restless leg 01/14/2015  . Chest pain 01/14/2015  . Back pain 01/14/2015  . Renal insufficiency 01/14/2015  . Dementia- mild memory issues 01/14/2015  . Occult blood positive stool 12/14/2014  . Anemia 12/14/2014  . Family history  of colon cancer 12/14/2014  . Change in bowel habits 12/14/2014  . GERD (gastroesophageal reflux disease) 12/14/2014  . Dyspnea 05/17/2012  . Lymphedema 05/17/2012  . Weight gain 05/17/2012  . HTN (hypertension) 05/17/2012    Rayetta Humphrey, PT CLT [phone] 07/05/2018, 4:30 PM  [place], [place], [address] Phone: [phone]   Fax:  [phone]  Name: LEIRA REGINO MRN: [id number] Date of Birth: 01/18/1946

## 2018-07-06 ENCOUNTER — Ambulatory Visit (HOSPITAL_COMMUNITY): Payer: Medicare Other | Admitting: Physical Therapy

## 2018-07-06 ENCOUNTER — Ambulatory Visit (INDEPENDENT_AMBULATORY_CARE_PROVIDER_SITE_OTHER): Payer: Medicare Other | Admitting: Orthopedic Surgery

## 2018-07-07 ENCOUNTER — Ambulatory Visit (HOSPITAL_COMMUNITY): Payer: Medicare Other | Admitting: Physical Therapy

## 2018-07-07 DIAGNOSIS — I890 Lymphedema, not elsewhere classified: Secondary | ICD-10-CM

## 2018-07-07 DIAGNOSIS — R262 Difficulty in walking, not elsewhere classified: Secondary | ICD-10-CM | POA: Diagnosis not present

## 2018-07-07 NOTE — Therapy (Signed)
[place], [place], [address] Phone: [phone]   Fax:  [phone]  Physical Therapy Treatment  Patient Details  Name: [name] MRN: [id number] Date of Birth: [birth year] Referring Provider (PT): Hilbert Corrigan   Encounter Date: 07/07/2018  PT End of Session - 07/07/18 1757    Visit Number  7    Number of Visits  18    Date for PT Re-Evaluation  08/04/18    Authorization Type  medicare    PT Start Time  1355    PT Stop Time  1525    PT Time Calculation (min)  90 min    Activity Tolerance  Patient tolerated treatment well    Behavior During Therapy  [facility] for tasks assessed/performed       Past Medical History:  Diagnosis Date  . Anemia   . Anxiety   . Arthritis    "hands, feet" (03/03/2017)  . Brain lesion    2 types  . Cervical spondylosis with myelopathy   . Chest pain    Normal cardiac cath 5/09  . Chronic lower back pain   . CKD (chronic kidney disease), stage III ([place])    [name]  . Congestive heart failure (CHF) ([place])    has a Cardiomems implant   . Constipation   . Dementia ([place])   . Depression   . Dumping syndrome   . Dyspnea    with activity  . Edema   . Fatigue   . Fibromyalgia   . GERD (gastroesophageal reflux disease)   . Headache    "w/high CBG" (03/03/2017)  . History of echocardiogram    a. Echo 03/20/16 (done at [facility] in [place], [place]):  mild LVH, EF 96%, normal diastolic function, mild LAE, MAC, RVSP 25 mmHg  . Hyperlipidemia   . Hypertension   . Hypothyroidism   . Lumbar spondylosis   . Lymphedema    seeing specialist for this  . Migraine    "mostly stopped when I changed my diet" (03/03/2017)  . OSA on CPAP   . Pneumonia X 1  . PONV (postoperative nausea and vomiting)   . Restless leg syndrome   . Rheumatoid arthritis ([place])   . Stroke [facility])    TIAs "mini strokes"- unsure of last TIA - pt and daughter deny this  . Syncope    . Tremors of nervous system   . Type II diabetes mellitus ([place])     Past Surgical History:  Procedure Laterality Date  . APPENDECTOMY  1966  . BACK SURGERY    . CARDIAC CATHETERIZATION N/A 05/25/2016   Procedure: Right/Left Heart Cath and Coronary Angiography;  Surgeon: Larey Dresser, MD;  Location: [place] [facility];  Service: Cardiovascular;  Laterality: N/A;  . CATARACT EXTRACTION W/ INTRAOCULAR LENS  IMPLANT, BILATERAL Bilateral   . COLONOSCOPY    . CORONARY ANGIOGRAM  2009   Normal coronaries  . EXCISION/RELEASE BURSA HIP Bilateral   . I&D KNEE WITH POLY EXCHANGE Left 03/02/2017   Procedure: Left knee Revision of poly liner;  Surgeon: Mcarthur Rossetti, MD;  Location: [place];  Service: Orthopedics;  Laterality: Left;  . JOINT REPLACEMENT    . KNEE ARTHROSCOPY Bilateral   . LAPAROSCOPIC CHOLECYSTECTOMY  2015  . LUMBAR DISC SURGERY    . LUMBAR LAMINECTOMY/DECOMPRESSION MICRODISCECTOMY Left 12/2005   L2-3 laminectomy and diskectomy/notes 01/06/2011  . POSTERIOR LUMBAR FUSION  04/2006   [name]  01/06/2011; "put cages in"  . REVERSE SHOULDER ARTHROPLASTY Left 06/15/2017   Procedure: REVERSE LEFT SHOULDER ARTHROPLASTY;  Surgeon: Meredith Pel, MD;  Location: [place];  Service: Orthopedics;  Laterality: Left;  . REVERSE SHOULDER ARTHROPLASTY Right 02/01/2018  . REVERSE SHOULDER ARTHROPLASTY Right 02/01/2018   Procedure: RIGHT REVERSE SHOULDER ARTHROPLASTY;  Surgeon: Meredith Pel, MD;  Location: [place];  Service: Orthopedics;  Laterality: Right;  . REVISION TOTAL KNEE ARTHROPLASTY Left 03/02/2017   poly liner/notes 03/02/2017  . RIGHT HEART CATH N/A 11/13/2016   Procedure: Right Heart Cath with Cardiomems;  Surgeon: Larey Dresser, MD;  Location: [place] [facility];  Service: Cardiovascular;  Laterality: N/A;  . SHOULDER ARTHROSCOPY WITH ROTATOR CUFF REPAIR Right   . SHOULDER OPEN ROTATOR CUFF REPAIR Left 01/2011   [name] 01/29/2011  . TOTAL ABDOMINAL HYSTERECTOMY    .  TOTAL KNEE ARTHROPLASTY Left   . TOTAL KNEE ARTHROPLASTY Right 11/18/2012   Procedure: TOTAL KNEE ARTHROPLASTY;  Surgeon: Yvette Rack., MD;  Location: [place];  Service: Orthopedics;  Laterality: Right;  . TUBAL LIGATION    . UPPER GI ENDOSCOPY      There were no vitals filed for this visit.  Subjective Assessment - 07/07/18 1753    Subjective  Pt states she is going to need to change her appts until later in pm due to changing her meds.  States her knee is killing her and they have made an appt with orthopedist.     Currently in Pain?  Yes    Pain Score  5     Pain Location  Knee    Pain Orientation  Right    Pain Descriptors / Indicators  Aching    Pain Type  Chronic pain                       [facility] Adult PT Treatment/Exercise - 07/07/18 0001      Manual Therapy   Manual Therapy  Manual Lymphatic Drainage (MLD);Compression Bandaging    Manual therapy comments  completed seperate from all other aspects of treatment.     Manual Lymphatic Drainage (MLD)  supraclavicular, deep and superfical abdominal, routing using inguinal/axillary anastomosis and B LE anterior only due to mobility and time to complete full LE bandaging    Compression Bandaging  for bilateral LE's full LE"s using 1/2" foam, multilayer short stretch               PT Short Term Goals - 06/23/18 1555      PT SHORT TERM GOAL #1   Title  Pt to reduce LE edma by an average of 3 cm to reduce risk of cellulitis.     Time  3    Period  Weeks    Status  New    Target Date  07/14/18      PT SHORT TERM GOAL #2   Title  PT to be able to walk for over 500 ft with her walker without being fatiqued to maintain I at nursing facility.     Time  3    Period  Weeks    Status  New        PT Long Term Goals - 06/23/18 1556      PT LONG TERM GOAL #1   Title  Pt to reduce LE volume on an average of 6 cm to allow pt to be able to don her compression garment in comfort.  Time  6    Period  Weeks     Status  New    Target Date  08/04/18      PT LONG TERM GOAL #2   Title  PT to be able to don her pant and shoes with greater ease to be able to be I in dressing     Time  6    Period  Weeks    Status  New      PT LONG TERM GOAL #3   Title  Pt to be able to ambulate with her walker for over 800 ft to allow pt to go on community outings with her family.     Time  6    Period  Weeks    Status  New            Plan - 07/07/18 1758    Clinical Impression Statement  continued with manual lymph drainage for bilateral LE's.  Little induration noted today, only small amount around knees.  No follow up on compression pump yet but will need to contact company to get new sleeves.      Rehab Potential  Good    PT Frequency  3x / week    PT Duration  6 weeks    PT Treatment/Interventions  Compression bandaging;Manual lymph drainage;ADLs/Self Care Home Management;Therapeutic exercise    PT Next Visit Plan  Follow up on pump sleeves.  continue compression bandaging to thigh level.  measure on wednesdays.;        Patient will benefit from skilled therapeutic intervention in order to improve the following deficits and impairments:  Decreased activity tolerance, Difficulty walking, Increased edema, Decreased strength  Visit Diagnosis: Difficulty in walking, not elsewhere classified  Lymphedema, not elsewhere classified     Problem List Patient Active Problem List   Diagnosis Date Noted  . Rectal bleeding 07/05/2018  . Hemorrhoids 07/05/2018  . Anal itching 07/05/2018  . Rotator cuff arthropathy of right shoulder   . Shoulder arthritis 06/15/2017  . Status post revision of total knee replacement, left 04/29/2017  . Polyethylene liner wear following left total knee arthroplasty requiring isolated polyethylene liner exchange ([place]) 03/02/2017  . 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 Unstable angina ([place]) 05/21/2016  . Chronic diastolic heart failure  ([place]) 03/29/2016  . Normal coronary arteries 2009 01/14/2015  . Obesity-BMI 40 01/14/2015  . Restless leg 01/14/2015  . Chest pain 01/14/2015  . Back pain 01/14/2015  . Renal insufficiency 01/14/2015  . Dementia- mild memory issues 01/14/2015  . Occult blood positive stool 12/14/2014  . Anemia 12/14/2014  . Family history of colon cancer 12/14/2014  . Change in bowel habits 12/14/2014  . GERD (gastroesophageal reflux disease) 12/14/2014  . Dyspnea 05/17/2012  . Lymphedema 05/17/2012  . Weight gain 05/17/2012  . HTN (hypertension) 05/17/2012   [name], PTA/CLT [phone]  [name] 07/07/2018, 5:59 PM  [facility] [address] [place], [place], [address] Phone: [phone]   Fax:  [phone]  Name: KAREMA TOCCI MRN: [id number] Date of Birth: [birth year]

## 2018-07-08 ENCOUNTER — Ambulatory Visit (HOSPITAL_COMMUNITY): Payer: Medicare Other | Admitting: Physical Therapy

## 2018-07-08 ENCOUNTER — Encounter (HOSPITAL_COMMUNITY): Payer: Self-pay | Admitting: Physical Therapy

## 2018-07-08 DIAGNOSIS — R262 Difficulty in walking, not elsewhere classified: Secondary | ICD-10-CM | POA: Diagnosis not present

## 2018-07-08 DIAGNOSIS — I890 Lymphedema, not elsewhere classified: Secondary | ICD-10-CM

## 2018-07-08 NOTE — Therapy (Signed)
[place], [place], [address] Phone: [phone]   Fax:  [phone]  Physical Therapy Treatment  Patient Details  Name: [name] MRN: [id number] Date of Birth: 1946/06/16 Referring Provider (PT): Hilbert Corrigan   Encounter Date: 07/08/2018  PT End of Session - 07/08/18 1627    Visit Number  8    Number of Visits  18    Date for PT Re-Evaluation  08/04/18    Authorization Type  medicare    PT Start Time  1324    PT Stop Time  1629    PT Time Calculation (min)  54 min    Activity Tolerance  Patient tolerated treatment well    Behavior During Therapy  [facility] for tasks assessed/performed       Past Medical History:  Diagnosis Date  . Anemia   . Anxiety   . Arthritis    "hands, feet" (03/03/2017)  . Brain lesion    2 types  . Cervical spondylosis with myelopathy   . Chest pain    Normal cardiac cath 5/09  . Chronic lower back pain   . CKD (chronic kidney disease), stage III ([place])    [name]  . Congestive heart failure (CHF) ([place])    has a Cardiomems implant   . Constipation   . Dementia ([place])   . Depression   . Dumping syndrome   . Dyspnea    with activity  . Edema   . Fatigue   . Fibromyalgia   . GERD (gastroesophageal reflux disease)   . Headache    "w/high CBG" (03/03/2017)  . History of echocardiogram    a. Echo 03/20/16 (done at [facility] in [place], [place]):  mild LVH, EF 40%, normal diastolic function, mild LAE, MAC, RVSP 25 mmHg  . Hyperlipidemia   . Hypertension   . Hypothyroidism   . Lumbar spondylosis   . Lymphedema    seeing specialist for this  . Migraine    "mostly stopped when I changed my diet" (03/03/2017)  . OSA on CPAP   . Pneumonia X 1  . PONV (postoperative nausea and vomiting)   . Restless leg syndrome   . Rheumatoid arthritis ([place])   . Stroke [facility])    TIAs "mini strokes"- unsure of last TIA - pt and daughter deny this  . Syncope    . Tremors of nervous system   . Type II diabetes mellitus ([place])     Past Surgical History:  Procedure Laterality Date  . APPENDECTOMY  1966  . BACK SURGERY    . CARDIAC CATHETERIZATION N/A 05/25/2016   Procedure: Right/Left Heart Cath and Coronary Angiography;  Surgeon: Larey Dresser, MD;  Location: [place] [facility];  Service: Cardiovascular;  Laterality: N/A;  . CATARACT EXTRACTION W/ INTRAOCULAR LENS  IMPLANT, BILATERAL Bilateral   . COLONOSCOPY    . CORONARY ANGIOGRAM  2009   Normal coronaries  . EXCISION/RELEASE BURSA HIP Bilateral   . I&D KNEE WITH POLY EXCHANGE Left 03/02/2017   Procedure: Left knee Revision of poly liner;  Surgeon: Mcarthur Rossetti, MD;  Location: [place];  Service: Orthopedics;  Laterality: Left;  . JOINT REPLACEMENT    . KNEE ARTHROSCOPY Bilateral   . LAPAROSCOPIC CHOLECYSTECTOMY  2015  . LUMBAR DISC SURGERY    . LUMBAR LAMINECTOMY/DECOMPRESSION MICRODISCECTOMY Left 12/2005   L2-3 laminectomy and diskectomy/notes 01/06/2011  . POSTERIOR LUMBAR FUSION  04/2006   [name]  01/06/2011; "put cages in"  . REVERSE SHOULDER ARTHROPLASTY Left 06/15/2017   Procedure: REVERSE LEFT SHOULDER ARTHROPLASTY;  Surgeon: Meredith Pel, MD;  Location: [place];  Service: Orthopedics;  Laterality: Left;  . REVERSE SHOULDER ARTHROPLASTY Right 02/01/2018  . REVERSE SHOULDER ARTHROPLASTY Right 02/01/2018   Procedure: RIGHT REVERSE SHOULDER ARTHROPLASTY;  Surgeon: Meredith Pel, MD;  Location: [facility];  Service: Orthopedics;  Laterality: Right;  . REVISION TOTAL KNEE ARTHROPLASTY Left 03/02/2017   poly liner/notes 03/02/2017  . RIGHT HEART CATH N/A 11/13/2016   Procedure: Right Heart Cath with Cardiomems;  Surgeon: Larey Dresser, MD;  Location: [place] [facility];  Service: Cardiovascular;  Laterality: N/A;  . SHOULDER ARTHROSCOPY WITH ROTATOR CUFF REPAIR Right   . SHOULDER OPEN ROTATOR CUFF REPAIR Left 01/2011   [name] 01/29/2011  . TOTAL ABDOMINAL HYSTERECTOMY    .  TOTAL KNEE ARTHROPLASTY Left   . TOTAL KNEE ARTHROPLASTY Right 11/18/2012   Procedure: TOTAL KNEE ARTHROPLASTY;  Surgeon: Yvette Rack., MD;  Location: [place];  Service: Orthopedics;  Laterality: Right;  . TUBAL LIGATION    . UPPER GI ENDOSCOPY      There were no vitals filed for this visit.  Subjective Assessment - 07/08/18 1626    Subjective  PT states that her bandages fell off and she did not want to go the whole weekend without being wrapped.    Pertinent History  B lymphedema, TSR, TKR, CHF, CKD, OA     How long can you sit comfortably?  no problem     How long can you walk comfortably?  Able to walk with a walker she was walking quite a bit but since her legs have gotten so big she can only go for about 250 feet at a time due to her leg feeling like it ways 2000 lb.     Patient Stated Goals  legs to get smaller to be able to wear compression     Currently in Pain?  Yes    Pain Score  6     Pain Location  Knee    Pain Orientation  Left    Pain Descriptors / Indicators  Aching    Pain Type  Chronic pain    Pain Onset  More than a month ago    Pain Frequency  Constant    Aggravating Factors   walking     Pain Relieving Factors  not sure                        [facility] Adult PT Treatment/Exercise - 07/08/18 0001      Manual Therapy   Compression Bandaging  for bilateral LE's full LE"s using 1/2" foam, multilayer short stretch               PT Short Term Goals - 06/23/18 1555      PT SHORT TERM GOAL #1   Title  Pt to reduce LE edma by an average of 3 cm to reduce risk of cellulitis.     Time  3    Period  Weeks    Status  New    Target Date  07/14/18      PT SHORT TERM GOAL #2   Title  PT to be able to walk for over 500 ft with her walker without being fatiqued to maintain I at nursing facility.     Time  3    Period  Weeks  Status  New        PT Long Term Goals - 06/23/18 1556      PT LONG TERM GOAL #1   Title  Pt to reduce LE volume on  an average of 6 cm to allow pt to be able to don her compression garment in comfort.     Time  6    Period  Weeks    Status  New    Target Date  08/04/18      PT LONG TERM GOAL #2   Title  PT to be able to don her pant and shoes with greater ease to be able to be I in dressing     Time  6    Period  Weeks    Status  New      PT LONG TERM GOAL #3   Title  Pt to be able to ambulate with her walker for over 800 ft to allow pt to go on community outings with her family.     Time  6    Period  Weeks    Status  New            Plan - 07/08/18 1628    Clinical Impression Statement  PT called clinic asking if she could come in to be wrapped as her bandages had slid down.  No time for manual but therapist completed B compression bandaging from toes to thighs.   PT LE appear to have decreased volume; thigh area appears to be about the same.  PT encouraged to walk at her facility.     Rehab Potential  Good    PT Frequency  3x / week    PT Duration  6 weeks    PT Treatment/Interventions  Compression bandaging;Manual lymph drainage;ADLs/Self Care Home Management;Therapeutic exercise    PT Next Visit Plan  Follow up on pump sleeves.  continue compression bandaging to thigh level.  measure on wednesdays.;        Patient will benefit from skilled therapeutic intervention in order to improve the following deficits and impairments:  Decreased activity tolerance, Difficulty walking, Increased edema, Decreased strength  Visit Diagnosis: Difficulty in walking, not elsewhere classified  Lymphedema, not elsewhere classified     Problem List Patient Active Problem List   Diagnosis Date Noted  . Rectal bleeding 07/05/2018  . Hemorrhoids 07/05/2018  . Anal itching 07/05/2018  . Rotator cuff arthropathy of right shoulder   . Shoulder arthritis 06/15/2017  . Status post revision of total knee replacement, left 04/29/2017  . Polyethylene liner wear following left total knee arthroplasty  requiring isolated polyethylene liner exchange ([place]) 03/02/2017  . 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 Unstable angina ([place]) 05/21/2016  . Chronic diastolic heart failure ([place]) 03/29/2016  . Normal coronary arteries 2009 01/14/2015  . Obesity-BMI 40 01/14/2015  . Restless leg 01/14/2015  . Chest pain 01/14/2015  . Back pain 01/14/2015  . Renal insufficiency 01/14/2015  . Dementia- mild memory issues 01/14/2015  . Occult blood positive stool 12/14/2014  . Anemia 12/14/2014  . Family history of colon cancer 12/14/2014  . Change in bowel habits 12/14/2014  . GERD (gastroesophageal reflux disease) 12/14/2014  . Dyspnea 05/17/2012  . Lymphedema 05/17/2012  . Weight gain 05/17/2012  . HTN (hypertension) 05/17/2012    Rayetta Humphrey, PT CLT [phone] 07/08/2018, 4:32 PM  [facility] [address] [place], [place], [address] Phone: [phone]  Fax:  [phone]  Name: VERNELLA NIZNIK MRN: [id number] Date of Birth: [birth year]

## 2018-07-12 ENCOUNTER — Ambulatory Visit (HOSPITAL_COMMUNITY): Payer: Medicare Other | Admitting: Physical Therapy

## 2018-07-12 DIAGNOSIS — R262 Difficulty in walking, not elsewhere classified: Secondary | ICD-10-CM

## 2018-07-12 DIAGNOSIS — I890 Lymphedema, not elsewhere classified: Secondary | ICD-10-CM

## 2018-07-12 NOTE — Therapy (Signed)
[place], [place], [address] Phone: [phone]   Fax:  [phone]  Physical Therapy Treatment  Patient Details  Name: [name] MRN: [id number] Date of Birth: September 28, 1945 Referring Provider (PT): Hilbert Corrigan   Encounter Date: 07/12/2018  PT End of Session - 07/12/18 1210    Visit Number  9    Number of Visits  18    Date for PT Re-Evaluation  08/04/18    Authorization Type  medicare    PT Start Time  0905    PT Stop Time  1040    PT Time Calculation (min)  95 min    Activity Tolerance  Patient tolerated treatment well    Behavior During Therapy  [facility] for tasks assessed/performed       Past Medical History:  Diagnosis Date  . Anemia   . Anxiety   . Arthritis    "hands, feet" (03/03/2017)  . Brain lesion    2 types  . Cervical spondylosis with myelopathy   . Chest pain    Normal cardiac cath 5/09  . Chronic lower back pain   . CKD (chronic kidney disease), stage III ([place])    [name]  . Congestive heart failure (CHF) ([place])    has a Cardiomems implant   . Constipation   . Dementia ([place])   . Depression   . Dumping syndrome   . Dyspnea    with activity  . Edema   . Fatigue   . Fibromyalgia   . GERD (gastroesophageal reflux disease)   . Headache    "w/high CBG" (03/03/2017)  . History of echocardiogram    a. Echo 03/20/16 (done at [facility] in [place], [place]):  mild LVH, EF 95%, normal diastolic function, mild LAE, MAC, RVSP 25 mmHg  . Hyperlipidemia   . Hypertension   . Hypothyroidism   . Lumbar spondylosis   . Lymphedema    seeing specialist for this  . Migraine    "mostly stopped when I changed my diet" (03/03/2017)  . OSA on CPAP   . Pneumonia X 1  . PONV (postoperative nausea and vomiting)   . Restless leg syndrome   . Rheumatoid arthritis ([place])   . Stroke [facility])    TIAs "mini strokes"- unsure of last TIA - pt and daughter deny this  . Syncope    . Tremors of nervous system   . Type II diabetes mellitus ([place])     Past Surgical History:  Procedure Laterality Date  . APPENDECTOMY  1966  . BACK SURGERY    . CARDIAC CATHETERIZATION N/A 05/25/2016   Procedure: Right/Left Heart Cath and Coronary Angiography;  Surgeon: Larey Dresser, MD;  Location: [place] [facility];  Service: Cardiovascular;  Laterality: N/A;  . CATARACT EXTRACTION W/ INTRAOCULAR LENS  IMPLANT, BILATERAL Bilateral   . COLONOSCOPY    . CORONARY ANGIOGRAM  2009   Normal coronaries  . EXCISION/RELEASE BURSA HIP Bilateral   . I&D KNEE WITH POLY EXCHANGE Left 03/02/2017   Procedure: Left knee Revision of poly liner;  Surgeon: Mcarthur Rossetti, MD;  Location: [place];  Service: Orthopedics;  Laterality: Left;  . JOINT REPLACEMENT    . KNEE ARTHROSCOPY Bilateral   . LAPAROSCOPIC CHOLECYSTECTOMY  2015  . LUMBAR DISC SURGERY    . LUMBAR LAMINECTOMY/DECOMPRESSION MICRODISCECTOMY Left 12/2005   L2-3 laminectomy and diskectomy/notes 01/06/2011  . POSTERIOR LUMBAR FUSION  04/2006   [name]  01/06/2011; "put cages in"  . REVERSE SHOULDER ARTHROPLASTY Left 06/15/2017   Procedure: REVERSE LEFT SHOULDER ARTHROPLASTY;  Surgeon: Meredith Pel, MD;  Location: [place];  Service: Orthopedics;  Laterality: Left;  . REVERSE SHOULDER ARTHROPLASTY Right 02/01/2018  . REVERSE SHOULDER ARTHROPLASTY Right 02/01/2018   Procedure: RIGHT REVERSE SHOULDER ARTHROPLASTY;  Surgeon: Meredith Pel, MD;  Location: [place];  Service: Orthopedics;  Laterality: Right;  . REVISION TOTAL KNEE ARTHROPLASTY Left 03/02/2017   poly liner/notes 03/02/2017  . RIGHT HEART CATH N/A 11/13/2016   Procedure: Right Heart Cath with Cardiomems;  Surgeon: Larey Dresser, MD;  Location: [place] [facility];  Service: Cardiovascular;  Laterality: N/A;  . SHOULDER ARTHROSCOPY WITH ROTATOR CUFF REPAIR Right   . SHOULDER OPEN ROTATOR CUFF REPAIR Left 01/2011   [name] 01/29/2011  . TOTAL ABDOMINAL HYSTERECTOMY    .  TOTAL KNEE ARTHROPLASTY Left   . TOTAL KNEE ARTHROPLASTY Right 11/18/2012   Procedure: TOTAL KNEE ARTHROPLASTY;  Surgeon: Yvette Rack., MD;  Location: [place];  Service: Orthopedics;  Laterality: Right;  . TUBAL LIGATION    . UPPER GI ENDOSCOPY      There were no vitals filed for this visit.  Subjective Assessment - 07/12/18 1209    Subjective  Pt reports according to the PT at her facility, she has a torn ligament in her Lt knee.  States it is very painful when she first gets up on it and sometimes her knees gives out.     Currently in Pain?  Yes    Pain Score  7     Pain Location  Knee    Pain Orientation  Left    Pain Descriptors / Indicators  Aching    Pain Type  Chronic pain                       [facility] Adult PT Treatment/Exercise - 07/12/18 0001      Manual Therapy   Manual Therapy  Manual Lymphatic Drainage (MLD);Compression Bandaging    Manual therapy comments  completed seperate from all other aspects of treatment.     Manual Lymphatic Drainage (MLD)  supraclavicular, deep and superfical abdominal, routing using inguinal/axillary anastomosis and B LE anterior only due to mobility and time to complete full LE bandaging    Compression Bandaging  for bilateral LE's full LE"s using 1/2" foam, multilayer short stretch               PT Short Term Goals - 06/23/18 1555      PT SHORT TERM GOAL #1   Title  Pt to reduce LE edma by an average of 3 cm to reduce risk of cellulitis.     Time  3    Period  Weeks    Status  New    [place] Date  07/14/18      PT SHORT TERM GOAL #2   Title  PT to be able to walk for over 500 ft with her walker without being fatiqued to maintain I at nursing facility.     Time  3    Period  Weeks    Status  New        PT Long Term Goals - 06/23/18 1556      PT LONG TERM GOAL #1   Title  Pt to reduce LE volume on an average of 6 cm to allow pt to be able to don her compression garment in comfort.  Time  6    Period   Weeks    Status  New    Target Date  08/04/18      PT LONG TERM GOAL #2   Title  PT to be able to don her pant and shoes with greater ease to be able to be I in dressing     Time  6    Period  Weeks    Status  New      PT LONG TERM GOAL #3   Title  Pt to be able to ambulate with her walker for over 800 ft to allow pt to go on community outings with her family.     Time  6    Period  Weeks    Status  New            Plan - 07/12/18 1212    Clinical Impression Statement  Pt took extended time today to complete all transitional movements and ambulation due to increased Lt knee pain.  Continued with ambulation to/from treatment room and waiting room but with increased time to complete.  Able to complete full manual and bilateral LE bandaging this session.     Rehab Potential  Good    PT Frequency  3x / week    PT Duration  6 weeks    PT Treatment/Interventions  Compression bandaging;Manual lymph drainage;ADLs/Self Care Home Management;Therapeutic exercise    PT Next Visit Plan  Continue compression bandaging to thigh level.  measure on wednesdays.; Will still need f/u on sleeves and discussion with facility regarding importance of putting on compression garments.        Patient will benefit from skilled therapeutic intervention in order to improve the following deficits and impairments:  Decreased activity tolerance, Difficulty walking, Increased edema, Decreased strength  Visit Diagnosis: Difficulty in walking, not elsewhere classified  Lymphedema, not elsewhere classified     Problem List Patient Active Problem List   Diagnosis Date Noted  . Rectal bleeding 07/05/2018  . Hemorrhoids 07/05/2018  . Anal itching 07/05/2018  . Rotator cuff arthropathy of right shoulder   . Shoulder arthritis 06/15/2017  . Status post revision of total knee replacement, left 04/29/2017  . Polyethylene liner wear following left total knee arthroplasty requiring isolated polyethylene liner  exchange ([place]) 03/02/2017  . 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 Unstable angina ([place]) 05/21/2016  . Chronic diastolic heart failure ([place]) 03/29/2016  . Normal coronary arteries 2009 01/14/2015  . Obesity-BMI 40 01/14/2015  . Restless leg 01/14/2015  . Chest pain 01/14/2015  . Back pain 01/14/2015  . Renal insufficiency 01/14/2015  . Dementia- mild memory issues 01/14/2015  . Occult blood positive stool 12/14/2014  . Anemia 12/14/2014  . Family history of colon cancer 12/14/2014  . Change in bowel habits 12/14/2014  . GERD (gastroesophageal reflux disease) 12/14/2014  . Dyspnea 05/17/2012  . Lymphedema 05/17/2012  . Weight gain 05/17/2012  . HTN (hypertension) 05/17/2012   [name], PTA/CLT [phone]  [name] 07/12/2018, 12:16 PM  [place], [place], [address] Phone: [phone]   Fax:  [phone]  Name: [name] MRN: [id number] Date of Birth: 09-Jul-1946

## 2018-07-13 ENCOUNTER — Ambulatory Visit (HOSPITAL_COMMUNITY): Payer: Medicare Other | Admitting: Physical Therapy

## 2018-07-13 DIAGNOSIS — I890 Lymphedema, not elsewhere classified: Secondary | ICD-10-CM

## 2018-07-13 DIAGNOSIS — R262 Difficulty in walking, not elsewhere classified: Secondary | ICD-10-CM | POA: Diagnosis not present

## 2018-07-13 NOTE — Therapy (Signed)
[place] [address] [place], [place], [address] Phone: [phone]   Fax:  [phone]  Physical Therapy Treatment Progress Note Reporting Period 06/23/2018-07/13/2018  See note below for Objective Data and Assessment of Progress/Goals.       Patient Details  Name: [name] MRN: [id number] Date of Birth: Jan 13, 1946 Referring Provider (PT): Hilbert Corrigan   Encounter Date: 07/13/2018  PT End of Session - 07/13/18 1816    Visit Number  10    Number of Visits  18    Date for PT Re-Evaluation  08/04/18    Authorization Type  medicare  cert period [id number]    PT Start Time  1608    PT Stop Time  1755    PT Time Calculation (min)  107 min    Activity Tolerance  Patient tolerated treatment well    Behavior During Therapy  [facility] for tasks assessed/performed       Past Medical History:  Diagnosis Date  . Anemia   . Anxiety   . Arthritis    "hands, feet" (03/03/2017)  . Brain lesion    2 types  . Cervical spondylosis with myelopathy   . Chest pain    Normal cardiac cath 5/09  . Chronic lower back pain   . CKD (chronic kidney disease), stage III ([place])    [name]  . Congestive heart failure (CHF) ([place])    has a Cardiomems implant   . Constipation   . Dementia ([place])   . Depression   . Dumping syndrome   . Dyspnea    with activity  . Edema   . Fatigue   . Fibromyalgia   . GERD (gastroesophageal reflux disease)   . Headache    "w/high CBG" (03/03/2017)  . History of echocardiogram    a. Echo 03/20/16 (done at [facility] in [place], [place]):  mild LVH, EF 68%, normal diastolic function, mild LAE, MAC, RVSP 25 mmHg  . Hyperlipidemia   . Hypertension   . Hypothyroidism   . Lumbar spondylosis   . Lymphedema    seeing specialist for this  . Migraine    "mostly stopped when I changed my diet" (03/03/2017)  . OSA on CPAP   . Pneumonia X 1  . PONV (postoperative nausea and vomiting)    . Restless leg syndrome   . Rheumatoid arthritis ([place])   . Stroke [facility])    TIAs "mini strokes"- unsure of last TIA - pt and daughter deny this  . Syncope   . Tremors of nervous system   . Type II diabetes mellitus ([place])     Past Surgical History:  Procedure Laterality Date  . APPENDECTOMY  1966  . BACK SURGERY    . CARDIAC CATHETERIZATION N/A 05/25/2016   Procedure: Right/Left Heart Cath and Coronary Angiography;  Surgeon: Larey Dresser, MD;  Location: [place] [facility];  Service: Cardiovascular;  Laterality: N/A;  . CATARACT EXTRACTION W/ INTRAOCULAR LENS  IMPLANT, BILATERAL Bilateral   . COLONOSCOPY    . CORONARY ANGIOGRAM  2009   Normal coronaries  . EXCISION/RELEASE BURSA HIP Bilateral   . I&D KNEE WITH POLY EXCHANGE Left 03/02/2017   Procedure: Left knee Revision of poly liner;  Surgeon: Mcarthur Rossetti, MD;  Location: [place];  Service: Orthopedics;  Laterality: Left;  . JOINT REPLACEMENT    . KNEE ARTHROSCOPY Bilateral   . LAPAROSCOPIC CHOLECYSTECTOMY  2015  . LUMBAR DISC SURGERY    .  LUMBAR LAMINECTOMY/DECOMPRESSION MICRODISCECTOMY Left 12/2005   L2-3 laminectomy and diskectomy/notes 01/06/2011  . POSTERIOR LUMBAR FUSION  04/2006   [name] 01/06/2011; "put cages in"  . REVERSE SHOULDER ARTHROPLASTY Left 06/15/2017   Procedure: REVERSE LEFT SHOULDER ARTHROPLASTY;  Surgeon: Meredith Pel, MD;  Location: [place];  Service: Orthopedics;  Laterality: Left;  . REVERSE SHOULDER ARTHROPLASTY Right 02/01/2018  . REVERSE SHOULDER ARTHROPLASTY Right 02/01/2018   Procedure: RIGHT REVERSE SHOULDER ARTHROPLASTY;  Surgeon: Meredith Pel, MD;  Location: [place];  Service: Orthopedics;  Laterality: Right;  . REVISION TOTAL KNEE ARTHROPLASTY Left 03/02/2017   poly liner/notes 03/02/2017  . RIGHT HEART CATH N/A 11/13/2016   Procedure: Right Heart Cath with Cardiomems;  Surgeon: Larey Dresser, MD;  Location: [place] [facility];  Service: Cardiovascular;  Laterality: N/A;  .  SHOULDER ARTHROSCOPY WITH ROTATOR CUFF REPAIR Right   . SHOULDER OPEN ROTATOR CUFF REPAIR Left 01/2011   [name] 01/29/2011  . TOTAL ABDOMINAL HYSTERECTOMY    . TOTAL KNEE ARTHROPLASTY Left   . TOTAL KNEE ARTHROPLASTY Right 11/18/2012   Procedure: TOTAL KNEE ARTHROPLASTY;  Surgeon: Yvette Rack., MD;  Location: [place];  Service: Orthopedics;  Laterality: Right;  . TUBAL LIGATION    . UPPER GI ENDOSCOPY      There were no vitals filed for this visit.  Subjective Assessment - 07/13/18 1758    Subjective  Pt states her Lt knee continues to halt her functional progression.  States she is getting therapy at her facility for her Lt knee and has orthopedic appt coming up.    Currently in Pain?  Yes    Pain Score  7     Pain Location  Knee    Pain Orientation  Left    Pain Descriptors / Indicators  Aching    Pain Type  Chronic pain            LYMPHEDEMA/ONCOLOGY QUESTIONNAIRE - 07/13/18 1808      What other symptoms do you have   Are you Having Heaviness or Tightness  Yes    Are you having Pain  Yes    Are you having pitting edema  Yes    Is it Hard or Difficult finding clothes that fit  Yes    Do you have infections  No    Other Symptoms  induration       Lymphedema Stage   Stage  STAGE 3 ELEPHANTIASIS      Lymphedema Assessments   Lymphedema Assessments  Lower extremities      Right Lower Extremity Lymphedema   20 cm Proximal to Suprapatella  70.5 cm   was 73 on 10/31 evaluation date   10 cm Proximal to Suprapatella  66.5 cm   was 68.8   At Midpatella/Popliteal Crease  57 cm   was 58.5   30 cm Proximal to Floor at Lateral Plantar Foot  52.5 cm   was 59.8   20 cm Proximal to Floor at Lateral Plantar Foot  47 1   was 53.7   10 cm Proximal to Floor at Lateral Malleoli  36 cm   was 42.5   Circumference of ankle/heel  36.5 cm.   was 40.4   5 cm Proximal to 1st MTP Joint  24 cm   was 24.4   Across MTP Joint  23.6 cm   was 23.5   Around Proximal Great Toe  8 cm   was  7.4     Left Lower  Extremity Lymphedema   20 cm Proximal to Suprapatella  75.5 cm   evalualuation 10/31 was 75   10 cm Proximal to Suprapatella  70 cm   was 72   At Midpatella/Popliteal Crease  55.8 cm   was 59.2   30 cm Proximal to Floor at Lateral Plantar Foot  57 cm   was 63   20 cm Proximal to Floor at Lateral Plantar Foot  46.5 cm   was 54.2   10 cm Proximal to Floor at Lateral Malleoli  38.5 cm   was 44.4   Circumference of ankle/heel  38 cm.   was 39   5 cm Proximal to 1st MTP Joint  23.5 cm   was 24.4   Across MTP Joint  22.8 cm   was 23.5   Around Proximal Great Toe  8 cm   wa 7.5               [facility] Adult PT Treatment/Exercise - 07/13/18 0001      Manual Therapy   Manual Therapy  Manual Lymphatic Drainage (MLD);Compression Bandaging;Other (comment)    Manual therapy comments  completed seperate from all other aspects of treatment.     Manual Lymphatic Drainage (MLD)  supraclavicular, deep and superfical abdominal, routing using inguinal/axillary anastomosis and B LE anterior only due to mobility and time to complete full LE bandaging    Compression Bandaging  for bilateral LE's full LE"s using 1/2" foam, multilayer short stretch    Other Manual Therapy  measurements for volume bilateral LE's               PT Short Term Goals - 07/13/18 1817      PT SHORT TERM GOAL #1   Title  Pt to reduce LE edma by an average of 3 cm to reduce risk of cellulitis.     Baseline  11/20:  Has lost in some areas up to 3cm    Time  3    Period  Weeks    Status  Partially Met      PT SHORT TERM GOAL #2   Title  PT to be able to walk for over 500 ft with her walker without being fatiqued to maintain I at nursing facility.     Time  3    Period  Weeks    Status  On-going      PT SHORT TERM GOAL #3   Status  On-going        PT Long Term Goals - 07/13/18 1818      PT LONG TERM GOAL #1   Title  Pt to reduce LE volume on an average of 6 cm to allow pt to be  able to don her compression garment in comfort.     Time  6    Period  Weeks    Status  On-going      PT LONG TERM GOAL #2   Title  PT to be able to don her pant and shoes with greater ease to be able to be Independent with dressing     Time  6    Period  Weeks    Status  On-going      PT LONG TERM GOAL #3   Title  Pt to be able to ambulate with her walker for over 800 ft to allow pt to go on community outings with her family.     Time  6    Period  Weeks  Status  On-going            Plan - 07/13/18 1813    Clinical Impression Statement  continued with manual lymph drainage and compression bandaging.  slight reduction in areas and slight increases in others without established pattern.  Overall has reduced since initial evaluation on 10/31, however not as rapidly as desired.  Plan to continue current protocol with weekly measurements.  Pt continues to ambulate to and from treatment room requiring RW and increased time due to painful LT knee.     Rehab Potential  Good    PT Frequency  3x / week    PT Duration  6 weeks    PT Treatment/Interventions  Compression bandaging;Manual lymph drainage;ADLs/Self Care Home Management;Therapeutic exercise    PT Next Visit Plan  Continue compression bandaging to thigh level.  measure on wednesdays.; Will still need f/u on sleeves and discussion with facility regarding importance of putting on compression garments.        Patient will benefit from skilled therapeutic intervention in order to improve the following deficits and impairments:  Decreased activity tolerance, Difficulty walking, Increased edema, Decreased strength  Visit Diagnosis: Difficulty in walking, not elsewhere classified  Lymphedema, not elsewhere classified     Problem List Patient Active Problem List   Diagnosis Date Noted  . Rectal bleeding 07/05/2018  . Hemorrhoids 07/05/2018  . Anal itching 07/05/2018  . Rotator cuff arthropathy of right shoulder   .  Shoulder arthritis 06/15/2017  . Status post revision of total knee replacement, left 04/29/2017  . Polyethylene liner wear following left total knee arthroplasty requiring isolated polyethylene liner exchange ([place]) 03/02/2017  . 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 Unstable angina ([place]) 05/21/2016  . Chronic diastolic heart failure ([place]) 03/29/2016  . Normal coronary arteries 2009 01/14/2015  . Obesity-BMI 40 01/14/2015  . Restless leg 01/14/2015  . Chest pain 01/14/2015  . Back pain 01/14/2015  . Renal insufficiency 01/14/2015  . Dementia- mild memory issues 01/14/2015  . Occult blood positive stool 12/14/2014  . Anemia 12/14/2014  . Family history of colon cancer 12/14/2014  . Change in bowel habits 12/14/2014  . GERD (gastroesophageal reflux disease) 12/14/2014  . Dyspnea 05/17/2012  . Lymphedema 05/17/2012  . Weight gain 05/17/2012  . HTN (hypertension) 05/17/2012   [name], PTA/CLT [phone]  [name] 07/13/2018, 6:26 PM  [place] [address] [place], [place], [address] Phone: [phone]   Fax:  [phone]  Name: [name] MRN: [id number] Date of Birth: [birth year]

## 2018-07-14 ENCOUNTER — Ambulatory Visit (HOSPITAL_COMMUNITY): Payer: Medicare Other | Admitting: Physical Therapy

## 2018-07-14 DIAGNOSIS — I890 Lymphedema, not elsewhere classified: Secondary | ICD-10-CM

## 2018-07-14 DIAGNOSIS — R262 Difficulty in walking, not elsewhere classified: Secondary | ICD-10-CM

## 2018-07-14 NOTE — Therapy (Signed)
[place], [place], [address] Phone: [phone]   Fax:  [phone]  Physical Therapy Treatment  Patient Details  Name: [name] MRN: [id number] Date of Birth: 24-Jun-1946 Referring Provider (PT): Hilbert Corrigan   Encounter Date: 07/14/2018  PT End of Session - 07/14/18 1713    Visit Number  11    Number of Visits  18    Date for PT Re-Evaluation  08/04/18    Authorization Type  medicare  cert period 8850-27/74    PT Start Time  1434    PT Stop Time  1604    PT Time Calculation (min)  90 min    Activity Tolerance  Patient tolerated treatment well    Behavior During Therapy  [facility] for tasks assessed/performed       Past Medical History:  Diagnosis Date  . Anemia   . Anxiety   . Arthritis    "hands, feet" (03/03/2017)  . Brain lesion    2 types  . Cervical spondylosis with myelopathy   . Chest pain    Normal cardiac cath 5/09  . Chronic lower back pain   . CKD (chronic kidney disease), stage III ([place])    [name]  . Congestive heart failure (CHF) ([place])    has a Cardiomems implant   . Constipation   . Dementia ([place])   . Depression   . Dumping syndrome   . Dyspnea    with activity  . Edema   . Fatigue   . Fibromyalgia   . GERD (gastroesophageal reflux disease)   . Headache    "w/high CBG" (03/03/2017)  . History of echocardiogram    a. Echo 03/20/16 (done at [facility] in [place], [place]):  mild LVH, EF 12%, normal diastolic function, mild LAE, MAC, RVSP 25 mmHg  . Hyperlipidemia   . Hypertension   . Hypothyroidism   . Lumbar spondylosis   . Lymphedema    seeing specialist for this  . Migraine    "mostly stopped when I changed my diet" (03/03/2017)  . OSA on CPAP   . Pneumonia X 1  . PONV (postoperative nausea and vomiting)   . Restless leg syndrome   . Rheumatoid arthritis ([place])   . Stroke [facility])    TIAs "mini strokes"- unsure of last TIA - pt and daughter  deny this  . Syncope   . Tremors of nervous system   . Type II diabetes mellitus ([place])     Past Surgical History:  Procedure Laterality Date  . APPENDECTOMY  1966  . BACK SURGERY    . CARDIAC CATHETERIZATION N/A 05/25/2016   Procedure: Right/Left Heart Cath and Coronary Angiography;  Surgeon: Larey Dresser, MD;  Location: [facility];  Service: Cardiovascular;  Laterality: N/A;  . CATARACT EXTRACTION W/ INTRAOCULAR LENS  IMPLANT, BILATERAL Bilateral   . COLONOSCOPY    . CORONARY ANGIOGRAM  2009   Normal coronaries  . EXCISION/RELEASE BURSA HIP Bilateral   . I&D KNEE WITH POLY EXCHANGE Left 03/02/2017   Procedure: Left knee Revision of poly liner;  Surgeon: Mcarthur Rossetti, MD;  Location: [facility];  Service: Orthopedics;  Laterality: Left;  . JOINT REPLACEMENT    . KNEE ARTHROSCOPY Bilateral   . LAPAROSCOPIC CHOLECYSTECTOMY  2015  . LUMBAR DISC SURGERY    . LUMBAR LAMINECTOMY/DECOMPRESSION MICRODISCECTOMY Left 12/2005   L2-3 laminectomy and diskectomy/notes 01/06/2011  . POSTERIOR LUMBAR FUSION  04/2006   [name] 01/06/2011; "put cages in"  . REVERSE SHOULDER ARTHROPLASTY Left 06/15/2017   Procedure: REVERSE LEFT SHOULDER ARTHROPLASTY;  Surgeon: Meredith Pel, MD;  Location: [facility];  Service: Orthopedics;  Laterality: Left;  . REVERSE SHOULDER ARTHROPLASTY Right 02/01/2018  . REVERSE SHOULDER ARTHROPLASTY Right 02/01/2018   Procedure: RIGHT REVERSE SHOULDER ARTHROPLASTY;  Surgeon: Meredith Pel, MD;  Location: [facility];  Service: Orthopedics;  Laterality: Right;  . REVISION TOTAL KNEE ARTHROPLASTY Left 03/02/2017   poly liner/notes 03/02/2017  . RIGHT HEART CATH N/A 11/13/2016   Procedure: Right Heart Cath with Cardiomems;  Surgeon: Larey Dresser, MD;  Location: [facility];  Service: Cardiovascular;  Laterality: N/A;  . SHOULDER ARTHROSCOPY WITH ROTATOR CUFF REPAIR Right   . SHOULDER OPEN ROTATOR CUFF REPAIR Left 01/2011   [name] 01/29/2011  . TOTAL  ABDOMINAL HYSTERECTOMY    . TOTAL KNEE ARTHROPLASTY Left   . TOTAL KNEE ARTHROPLASTY Right 11/18/2012   Procedure: TOTAL KNEE ARTHROPLASTY;  Surgeon: Yvette Rack., MD;  Location: [facility];  Service: Orthopedics;  Laterality: Right;  . TUBAL LIGATION    . UPPER GI ENDOSCOPY      There were no vitals filed for this visit.  Subjective Assessment - 07/14/18 1712    Subjective  Pt states her orthopedic appointment is on Monday (the 25th).  STates her knee is getting worse, the pain is horrible.    Currently in Pain?  Yes    Pain Score  8     Pain Location  Knee    Pain Orientation  Left    Pain Descriptors / Indicators  Aching;Sharp;Shooting    Pain Type  Chronic pain                       [facility] Adult PT Treatment/Exercise - 07/14/18 0001      Manual Therapy   Manual Therapy  Manual Lymphatic Drainage (MLD);Compression Bandaging;Other (comment)    Manual therapy comments  completed seperate from all other aspects of treatment.     Manual Lymphatic Drainage (MLD)  supraclavicular, deep and superfical abdominal, routing using inguinal/axillary anastomosis and B LE anterior only due to mobility and time to complete full LE bandaging    Compression Bandaging  for bilateral LE's full LE"s using 1/2" foam, multilayer short stretch               PT Short Term Goals - 07/13/18 1817      PT SHORT TERM GOAL #1   Title  Pt to reduce LE edma by an average of 3 cm to reduce risk of cellulitis.     Baseline  11/20:  Has lost in some areas up to 3cm    Time  3    Period  Weeks    Status  Partially Met      PT SHORT TERM GOAL #2   Title  PT to be able to walk for over 500 ft with her walker without being fatiqued to maintain I at nursing facility.     Time  3    Period  Weeks    Status  On-going      PT SHORT TERM GOAL #3   Status  On-going        PT Long Term Goals - 07/13/18 1818      PT LONG TERM GOAL #1   Title  Pt to reduce LE volume on an average of 6 cm  to allow pt to be able to don her compression garment in comfort.     Time  6    Period  Weeks    Status  On-going      PT LONG TERM GOAL #2   Title  PT to be able to don her pant and shoes with greater ease to be able to be Independent with dressing     Time  6    Period  Weeks    Status  On-going      PT LONG TERM GOAL #3   Title  Pt to be able to ambulate with her walker for over 800 ft to allow pt to go on community outings with her family.     Time  6    Period  Weeks    Status  On-going            Plan - 07/14/18 1713    Clinical Impression Statement  Pt taking longer with transfers and ambulation due to Lt knee pain and weakness.  Pt is working with PT at her facility for strengthening.  Pt very lethargic today during session.      Rehab Potential  Good    PT Frequency  3x / week    PT Duration  6 weeks    PT Treatment/Interventions  Compression bandaging;Manual lymph drainage;ADLs/Self Care Home Management;Therapeutic exercise    PT Next Visit Plan  Continue compression bandaging to thigh level.  measure on wednesdays.; Will still need f/u on sleeves and discussion with facility regarding importance of putting on compression garments.        Patient will benefit from skilled therapeutic intervention in order to improve the following deficits and impairments:  Decreased activity tolerance, Difficulty walking, Increased edema, Decreased strength  Visit Diagnosis: Difficulty in walking, not elsewhere classified  Lymphedema, not elsewhere classified     Problem List Patient Active Problem List   Diagnosis Date Noted  . Rectal bleeding 07/05/2018  . Hemorrhoids 07/05/2018  . Anal itching 07/05/2018  . Rotator cuff arthropathy of right shoulder   . Shoulder arthritis 06/15/2017  . Status post revision of total knee replacement, left 04/29/2017  . Polyethylene liner wear following left total knee arthroplasty requiring isolated polyethylene liner exchange ([place])  03/02/2017  . 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 Unstable angina ([place]) 05/21/2016  . Chronic diastolic heart failure ([place]) 03/29/2016  . Normal coronary arteries 2009 01/14/2015  . Obesity-BMI 40 01/14/2015  . Restless leg 01/14/2015  . Chest pain 01/14/2015  . Back pain 01/14/2015  . Renal insufficiency 01/14/2015  . Dementia- mild memory issues 01/14/2015  . Occult blood positive stool 12/14/2014  . Anemia 12/14/2014  . Family history of colon cancer 12/14/2014  . Change in bowel habits 12/14/2014  . GERD (gastroesophageal reflux disease) 12/14/2014  . Dyspnea 05/17/2012  . Lymphedema 05/17/2012  . Weight gain 05/17/2012  . HTN (hypertension) 05/17/2012   [name], PTA/CLT [phone]  [name] 07/14/2018, 5:43 PM  [facility] [address] [place], [place], [address] Phone: [phone]   Fax:  [phone]  Name: [name] MRN: [id number] Date of Birth: 18-Feb-1946

## 2018-07-18 ENCOUNTER — Telehealth (HOSPITAL_COMMUNITY): Payer: Self-pay | Admitting: Physical Therapy

## 2018-07-18 ENCOUNTER — Encounter (INDEPENDENT_AMBULATORY_CARE_PROVIDER_SITE_OTHER): Payer: Self-pay | Admitting: Orthopaedic Surgery

## 2018-07-18 ENCOUNTER — Ambulatory Visit (INDEPENDENT_AMBULATORY_CARE_PROVIDER_SITE_OTHER): Payer: Self-pay

## 2018-07-18 ENCOUNTER — Ambulatory Visit (INDEPENDENT_AMBULATORY_CARE_PROVIDER_SITE_OTHER): Payer: Medicare Other | Admitting: Orthopaedic Surgery

## 2018-07-18 ENCOUNTER — Ambulatory Visit (HOSPITAL_COMMUNITY): Payer: Medicare Other | Admitting: Physical Therapy

## 2018-07-18 DIAGNOSIS — Z96652 Presence of left artificial knee joint: Secondary | ICD-10-CM | POA: Diagnosis not present

## 2018-07-18 DIAGNOSIS — M25562 Pain in left knee: Secondary | ICD-10-CM

## 2018-07-18 NOTE — Progress Notes (Signed)
Office Visit Note   Patient: [name]           Date of Birth: 1946/03/26           MRN: [id number] Visit Date: 07/18/2018              Requested by: Hilbert Corrigan, [place], [place] [address] PCP: Hilbert Corrigan, MD   Assessment & Plan: Visit Diagnoses:  1. Left knee pain, unspecified chronicity   2. History of total left knee replacement (TKR)     Plan: I would be cautious to recommend any type of surgery for her for her knee at this point given her chronic renal insufficiency combined with her morbid obesity and lymphedema in her legs.  I think she is on the right track trying therapy and hopefully we can send her to [facility] for some type of hinged knee brace to help stabilize the leg.  We will see her back in about 6 weeks to see if this is helped.  Follow-Up Instructions: Return in about 6 weeks (around 08/29/2018).   Orders:  Orders Placed This Encounter  Procedures  . XR Knee 1-2 Views Left   No orders of the defined types were placed in this encounter.     Procedures: No procedures performed   Clinical Data: No additional findings.   Subjective: Chief Complaint  Patient presents with  . Left Knee - Pain  The patient is well-known to [place].  She had a total knee arthroplasty done by Dr. Berenice Primas years ago.  This was with a size 10 polyliner.  Over the years her knee and eventually become unstable.  She is someone who is morbidly obese with a BMI of 45.  She also has terrible lymphedema in her legs.  17 months ago we took to the operating room and had to upsize her poly-from a size 10 polyliner to a size 15.  This did improve her stability.  She does have chronic renal disease is worsening.  Her obesity is worsening and she has chronic lymphedema in her legs.  Her left leg is been weak now and feels unstable to her.  The therapist felt that there may be a ligamentous issue to the knee.  She does ambulate using a walker but has  limited mobility.  She states that she walked 500 feet the other day with therapy but then cried the rest of the night because her knee hurts so bad in her body just hurt overall.  She states that she cannot lift her leg well.  HPI  Review of Systems She currently denies any chest pain or shortness of breath.  She denies any fever, chills, nausea, vomiting  Objective: Vital Signs: There were no vitals taken for this visit.  Physical Exam She is alert and oriented x3 and in no acute distress Ortho Exam Both legs have significant obesity from the thighs down to the ankles with also lymphedema.  I can feel that her patella is intact I do feel it tracks laterally.  The knee itself feels stable in terms of varus and valgus stressing and a negative drawer sign. Specialty Comments:  No specialty comments available.  Imaging: Xr Knee 1-2 Views Left  Result Date: 07/18/2018 2 views of the left knee show a total knee arthroplasty with no complicating features in terms of the femoral or tibial components and the joint spaces well-maintained.  AP and lateral views show good position the implants.  The patella has some chronic subluxation to.    PMFS History: Patient Active Problem List   Diagnosis Date Noted  . History of total left knee replacement (TKR) 07/18/2018  . Rectal bleeding 07/05/2018  . Hemorrhoids 07/05/2018  . Anal itching 07/05/2018  . Rotator cuff arthropathy of right shoulder   . Shoulder arthritis 06/15/2017  . Status post revision of total knee replacement, left 04/29/2017  . Polyethylene liner wear following left total knee arthroplasty requiring isolated polyethylene liner exchange ([place]) 03/02/2017  . 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 Unstable angina ([place]) 05/21/2016  . Chronic diastolic heart failure ([place]) 03/29/2016  . Normal coronary arteries 2009 01/14/2015  . Obesity-BMI 40 01/14/2015  . Restless leg 01/14/2015  . Chest pain  01/14/2015  . Back pain 01/14/2015  . Renal insufficiency 01/14/2015  . Dementia- mild memory issues 01/14/2015  . Occult blood positive stool 12/14/2014  . Anemia 12/14/2014  . Family history of colon cancer 12/14/2014  . Change in bowel habits 12/14/2014  . GERD (gastroesophageal reflux disease) 12/14/2014  . Dyspnea 05/17/2012  . Lymphedema 05/17/2012  . Weight gain 05/17/2012  . HTN (hypertension) 05/17/2012   Past Medical History:  Diagnosis Date  . Anemia   . Anxiety   . Arthritis    "hands, feet" (03/03/2017)  . Brain lesion    2 types  . Cervical spondylosis with myelopathy   . Chest pain    Normal cardiac cath 5/09  . Chronic lower back pain   . CKD (chronic kidney disease), stage III ([place])    [name]  . Congestive heart failure (CHF) ([place])    has a Cardiomems implant   . Constipation   . Dementia ([place])   . Depression   . Dumping syndrome   . Dyspnea    with activity  . Edema   . Fatigue   . Fibromyalgia   . GERD (gastroesophageal reflux disease)   . Headache    "w/high CBG" (03/03/2017)  . History of echocardiogram    a. Echo 03/20/16 (done at [facility] in [place], [place]):  mild LVH, EF 95%, normal diastolic function, mild LAE, MAC, RVSP 25 mmHg  . Hyperlipidemia   . Hypertension   . Hypothyroidism   . Lumbar spondylosis   . Lymphedema    seeing specialist for this  . Migraine    "mostly stopped when I changed my diet" (03/03/2017)  . OSA on CPAP   . Pneumonia X 1  . PONV (postoperative nausea and vomiting)   . Restless leg syndrome   . Rheumatoid arthritis ([place])   . Stroke [facility])    TIAs "mini strokes"- unsure of last TIA - pt and daughter deny this  . Syncope   . Tremors of nervous system   . Type II diabetes mellitus ([facility])     Family History  Problem Relation Age of Onset  . Heart disease Mother   . Heart failure Mother   . Kidney disease Mother   . Lung cancer Father   . Colon cancer Brother   . Cancer Brother         malignant thymoma  . Heart disease Brother   . Tremor Maternal Grandmother 90  . Ovarian cancer Maternal Grandmother   . Uterine cancer Maternal Grandmother   . Irritable bowel syndrome Daughter   . Esophageal cancer Neg Hx   . Stomach cancer Neg Hx   . Inflammatory bowel disease Neg Hx   .  Liver disease Neg Hx   . Pancreatic cancer Neg Hx     Past Surgical History:  Procedure Laterality Date  . APPENDECTOMY  1966  . BACK SURGERY    . CARDIAC CATHETERIZATION N/A 05/25/2016   Procedure: Right/Left Heart Cath and Coronary Angiography;  Surgeon: Larey Dresser, MD;  Location: [facility];  Service: Cardiovascular;  Laterality: N/A;  . CATARACT EXTRACTION W/ INTRAOCULAR LENS  IMPLANT, BILATERAL Bilateral   . COLONOSCOPY    . CORONARY ANGIOGRAM  2009   Normal coronaries  . EXCISION/RELEASE BURSA HIP Bilateral   . I&D KNEE WITH POLY EXCHANGE Left 03/02/2017   Procedure: Left knee Revision of poly liner;  Surgeon: Mcarthur Rossetti, MD;  Location: [facility];  Service: Orthopedics;  Laterality: Left;  . JOINT REPLACEMENT    . KNEE ARTHROSCOPY Bilateral   . LAPAROSCOPIC CHOLECYSTECTOMY  2015  . LUMBAR DISC SURGERY    . LUMBAR LAMINECTOMY/DECOMPRESSION MICRODISCECTOMY Left 12/2005   L2-3 laminectomy and diskectomy/notes 01/06/2011  . POSTERIOR LUMBAR FUSION  04/2006   [name] 01/06/2011; "put cages in"  . REVERSE SHOULDER ARTHROPLASTY Left 06/15/2017   Procedure: REVERSE LEFT SHOULDER ARTHROPLASTY;  Surgeon: Meredith Pel, MD;  Location: [facility];  Service: Orthopedics;  Laterality: Left;  . REVERSE SHOULDER ARTHROPLASTY Right 02/01/2018  . REVERSE SHOULDER ARTHROPLASTY Right 02/01/2018   Procedure: RIGHT REVERSE SHOULDER ARTHROPLASTY;  Surgeon: Meredith Pel, MD;  Location: [facility];  Service: Orthopedics;  Laterality: Right;  . REVISION TOTAL KNEE ARTHROPLASTY Left 03/02/2017   poly liner/notes 03/02/2017  . RIGHT HEART CATH N/A 11/13/2016   Procedure: Right Heart Cath with  Cardiomems;  Surgeon: Larey Dresser, MD;  Location: [facility];  Service: Cardiovascular;  Laterality: N/A;  . SHOULDER ARTHROSCOPY WITH ROTATOR CUFF REPAIR Right   . SHOULDER OPEN ROTATOR CUFF REPAIR Left 01/2011   [name] 01/29/2011  . TOTAL ABDOMINAL HYSTERECTOMY    . TOTAL KNEE ARTHROPLASTY Left   . TOTAL KNEE ARTHROPLASTY Right 11/18/2012   Procedure: TOTAL KNEE ARTHROPLASTY;  Surgeon: Yvette Rack., MD;  Location: [facility];  Service: Orthopedics;  Laterality: Right;  . TUBAL LIGATION    . UPPER GI ENDOSCOPY     Social History   Occupational History  . Occupation: retired  Tobacco Use  . Smoking status: Never Smoker  . Smokeless tobacco: Never Used  Substance and Sexual Activity  . Alcohol use: No    Alcohol/week: 0.0 standard drinks  . Drug use: No  . Sexual activity: Not Currently

## 2018-07-18 NOTE — Telephone Encounter (Signed)
Patient have a MD appt and can't make it today.

## 2018-07-19 ENCOUNTER — Telehealth (HOSPITAL_COMMUNITY): Payer: Self-pay | Admitting: Adult Health

## 2018-07-19 ENCOUNTER — Other Ambulatory Visit: Payer: Self-pay

## 2018-07-19 ENCOUNTER — Ambulatory Visit (HOSPITAL_COMMUNITY): Payer: Medicare Other | Admitting: Physical Therapy

## 2018-07-19 DIAGNOSIS — I890 Lymphedema, not elsewhere classified: Secondary | ICD-10-CM

## 2018-07-19 DIAGNOSIS — R262 Difficulty in walking, not elsewhere classified: Secondary | ICD-10-CM

## 2018-07-19 NOTE — Telephone Encounter (Signed)
   I called her daughter asking for cardiomems reading.   No reading since 07/09/2018.   Her daughter will contact the facility to start readings again.   Kjersten [place] 3:29 PM

## 2018-07-19 NOTE — Therapy (Signed)
[facility], [place], [address] Phone: [phone]   Fax:  [phone]  Physical Therapy Treatment  Patient Details  Name: [name] MRN: [id number] Date of Birth: 1945-10-04 Referring Provider (PT): Hilbert Corrigan   Encounter Date: 07/19/2018  PT End of Session - 07/19/18 1150    Visit Number  12    Number of Visits  18    Date for PT Re-Evaluation  08/04/18    Authorization Type  medicare  cert period 0263-78/58    PT Start Time  0815    PT Stop Time  0940    PT Time Calculation (min)  85 min    Activity Tolerance  Patient tolerated treatment well    Behavior During Therapy  [facility] for tasks assessed/performed       Past Medical History:  Diagnosis Date  . Anemia   . Anxiety   . Arthritis    "hands, feet" (03/03/2017)  . Brain lesion    2 types  . Cervical spondylosis with myelopathy   . Chest pain    Normal cardiac cath 5/09  . Chronic lower back pain   . CKD (chronic kidney disease), stage III ([place])    [name]  . Congestive heart failure (CHF) ([place])    has a Cardiomems implant   . Constipation   . Dementia ([place])   . Depression   . Dumping syndrome   . Dyspnea    with activity  . Edema   . Fatigue   . Fibromyalgia   . GERD (gastroesophageal reflux disease)   . Headache    "w/high CBG" (03/03/2017)  . History of echocardiogram    a. Echo 03/20/16 (done at [facility] in [place], [place]):  mild LVH, EF 85%, normal diastolic function, mild LAE, MAC, RVSP 25 mmHg  . Hyperlipidemia   . Hypertension   . Hypothyroidism   . Lumbar spondylosis   . Lymphedema    seeing specialist for this  . Migraine    "mostly stopped when I changed my diet" (03/03/2017)  . OSA on CPAP   . Pneumonia X 1  . PONV (postoperative nausea and vomiting)   . Restless leg syndrome   . Rheumatoid arthritis ([place])   . Stroke [facility])    TIAs "mini strokes"- unsure of last TIA - pt and daughter  deny this  . Syncope   . Tremors of nervous system   . Type II diabetes mellitus ([place])     Past Surgical History:  Procedure Laterality Date  . APPENDECTOMY  1966  . BACK SURGERY    . CARDIAC CATHETERIZATION N/A 05/25/2016   Procedure: Right/Left Heart Cath and Coronary Angiography;  Surgeon: Larey Dresser, MD;  Location: [place] [facility];  Service: Cardiovascular;  Laterality: N/A;  . CATARACT EXTRACTION W/ INTRAOCULAR LENS  IMPLANT, BILATERAL Bilateral   . COLONOSCOPY    . CORONARY ANGIOGRAM  2009   Normal coronaries  . EXCISION/RELEASE BURSA HIP Bilateral   . I&D KNEE WITH POLY EXCHANGE Left 03/02/2017   Procedure: Left knee Revision of poly liner;  Surgeon: Mcarthur Rossetti, MD;  Location: [place];  Service: Orthopedics;  Laterality: Left;  . JOINT REPLACEMENT    . KNEE ARTHROSCOPY Bilateral   . LAPAROSCOPIC CHOLECYSTECTOMY  2015  . LUMBAR DISC SURGERY    . LUMBAR LAMINECTOMY/DECOMPRESSION MICRODISCECTOMY Left 12/2005   L2-3 laminectomy and diskectomy/notes 01/06/2011  . POSTERIOR LUMBAR FUSION  04/2006   [name] 01/06/2011; "put cages in"  . REVERSE SHOULDER ARTHROPLASTY Left 06/15/2017   Procedure: REVERSE LEFT SHOULDER ARTHROPLASTY;  Surgeon: Meredith Pel, MD;  Location: [place];  Service: Orthopedics;  Laterality: Left;  . REVERSE SHOULDER ARTHROPLASTY Right 02/01/2018  . REVERSE SHOULDER ARTHROPLASTY Right 02/01/2018   Procedure: RIGHT REVERSE SHOULDER ARTHROPLASTY;  Surgeon: Meredith Pel, MD;  Location: [place];  Service: Orthopedics;  Laterality: Right;  . REVISION TOTAL KNEE ARTHROPLASTY Left 03/02/2017   poly liner/notes 03/02/2017  . RIGHT HEART CATH N/A 11/13/2016   Procedure: Right Heart Cath with Cardiomems;  Surgeon: Larey Dresser, MD;  Location: [place] [facility];  Service: Cardiovascular;  Laterality: N/A;  . SHOULDER ARTHROSCOPY WITH ROTATOR CUFF REPAIR Right   . SHOULDER OPEN ROTATOR CUFF REPAIR Left 01/2011   [name] 01/29/2011  . TOTAL  ABDOMINAL HYSTERECTOMY    . TOTAL KNEE ARTHROPLASTY Left   . TOTAL KNEE ARTHROPLASTY Right 11/18/2012   Procedure: TOTAL KNEE ARTHROPLASTY;  Surgeon: Yvette Rack., MD;  Location: [place];  Service: Orthopedics;  Laterality: Right;  . TUBAL LIGATION    . UPPER GI ENDOSCOPY      There were no vitals filed for this visit.  Subjective Assessment - 07/19/18 1043    Subjective  PT states she is going to get a brace fitted for her Lt knee today therefore the MD requested pt only to be bandatged to knee level not thigh.  (Pended)     Pertinent History  B lymphedema, TSR, TKR, CHF, CKD, OA   (Pended)     How long can you sit comfortably?  no problem   (Pended)     How long can you walk comfortably?  Able to walk with a walker she was walking quite a bit but since her legs have gotten so big she can only go for about 250 feet at a time due to her leg feeling like it ways 2000 lb.   (Pended)     Patient Stated Goals  legs to get smaller to be able to wear compression   (Pended)     Currently in Pain?  --  (Pended)    only Lt knee which we are not working on we are seeing pt for lymphedema   Pain Onset  More than a month ago  (Pended)                        [facility] Adult PT Treatment/Exercise - 07/19/18 0001      Manual Therapy   Manual Therapy  Manual Lymphatic Drainage (MLD);Compression Bandaging;Other (comment)    Manual therapy comments  completed seperate from all other aspects of treatment.     Manual Lymphatic Drainage (MLD)  supraclavicular, deep and superfical abdominal, routing using inguinal/axillary anastomosis and B LE anterior only due to mobility and time to complete full LE bandaging    Compression Bandaging  for RT  LE full LE using 1/2" foam, multilayer short stretch/ Rt LE to knee level only           PT Short Term Goals - 07/13/18 1817      PT SHORT TERM GOAL #1   Title  Pt to reduce LE edma by an average of 3 cm to reduce risk of cellulitis.     Baseline   11/20:  Has lost in some areas up to 3cm    Time  3    Period  Weeks    Status  Partially Met      PT SHORT TERM GOAL #2   Title  PT to be able to walk for over 500 ft with her walker without being fatiqued to maintain I at nursing facility.     Time  3    Period  Weeks    Status  On-going      PT SHORT TERM GOAL #3   Status  On-going        PT Long Term Goals - 07/13/18 1818      PT LONG TERM GOAL #1   Title  Pt to reduce LE volume on an average of 6 cm to allow pt to be able to don her compression garment in comfort.     Time  6    Period  Weeks    Status  On-going      PT LONG TERM GOAL #2   Title  PT to be able to don her pant and shoes with greater ease to be able to be Independent with dressing     Time  6    Period  Weeks    Status  On-going      PT LONG TERM GOAL #3   Title  Pt to be able to ambulate with her walker for over 800 ft to allow pt to go on community outings with her family.     Time  6    Period  Weeks    Status  On-going            Plan - 07/19/18 1203    Clinical Impression Statement  PT going for a brace to LT knee today therefore compression bandages to knee level only.  PT will be measured tomorrow if volumes are comparable to D/C at last episode may discharge pt.     Rehab Potential  Good    PT Frequency  3x / week    PT Duration  6 weeks    PT Treatment/Interventions  Compression bandaging;Manual lymph drainage;ADLs/Self Care Home Management;Therapeutic exercise    PT Next Visit Plan  Measure next treatment     Consulted and Agree with Plan of Care  Patient       Patient will benefit from skilled therapeutic intervention in order to improve the following deficits and impairments:  Decreased activity tolerance, Difficulty walking, Increased edema, Decreased strength  Visit Diagnosis: Difficulty in walking, not elsewhere classified  Lymphedema, not elsewhere classified     Problem List Patient Active Problem List   Diagnosis  Date Noted  . History of total left knee replacement (TKR) 07/18/2018  . Rectal bleeding 07/05/2018  . Hemorrhoids 07/05/2018  . Anal itching 07/05/2018  . Rotator cuff arthropathy of right shoulder   . Shoulder arthritis 06/15/2017  . Status post revision of total knee replacement, left 04/29/2017  . Polyethylene liner wear following left total knee arthroplasty requiring isolated polyethylene liner exchange ([place]) 03/02/2017  . 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 Unstable angina ([place]) 05/21/2016  . Chronic diastolic heart failure ([place]) 03/29/2016  . Normal coronary arteries 2009 01/14/2015  . Obesity-BMI 40 01/14/2015  . Restless leg 01/14/2015  . Chest pain 01/14/2015  . Back pain 01/14/2015  . Renal insufficiency 01/14/2015  . Dementia- mild memory issues 01/14/2015  . Occult blood positive stool 12/14/2014  . Anemia 12/14/2014  . Family history of colon cancer 12/14/2014  . Change in bowel habits 12/14/2014  . GERD (  gastroesophageal reflux disease) 12/14/2014  . Dyspnea 05/17/2012  . Lymphedema 05/17/2012  . Weight gain 05/17/2012  . HTN (hypertension) 05/17/2012    Rayetta Humphrey, PT CLT [phone] 07/19/2018, 12:05 PM  [facility] [address] [place], [place], [address] Phone: [phone]   Fax:  [phone]  Name: [name] MRN: [id number] Date of Birth: 1946/02/25

## 2018-07-20 ENCOUNTER — Ambulatory Visit (HOSPITAL_COMMUNITY): Payer: Medicare Other | Admitting: Physical Therapy

## 2018-07-20 DIAGNOSIS — M25562 Pain in left knee: Secondary | ICD-10-CM

## 2018-07-20 DIAGNOSIS — R262 Difficulty in walking, not elsewhere classified: Secondary | ICD-10-CM | POA: Diagnosis not present

## 2018-07-20 DIAGNOSIS — I890 Lymphedema, not elsewhere classified: Secondary | ICD-10-CM

## 2018-07-20 DIAGNOSIS — G8929 Other chronic pain: Secondary | ICD-10-CM

## 2018-07-20 NOTE — Therapy (Addendum)
[place], [place], [address] Phone: [phone]   Fax:  [phone]  Physical Therapy Treatment/Discharge  Patient Details  Name: [name] MRN: [id number] Date of Birth: Mar 21, 1946 Referring Provider (PT): [place] THERAPY DISCHARGE SUMMARY  Visits from Start of Care: 13  Current functional level related to goals / functional outcomes: Pt volumes had decreased but were not at minimal volume yet.  Pt medical issue Changed as she had a torn knee ligament and needs to wear a brace therefore we are Unable to compression bandage which will limit the success of lymphedema treatments.    Remaining deficits: Leg volume   Education / Equipment: HEP  Plan: Patient agrees to discharge.  Patient goals were partially met. Patient is being discharged due to a change in medical status.  ?????       Encounter Date: 07/20/2018  PT End of Session - 07/20/18 1425    Visit Number  13    Number of Visits  13   Date for PT Re-Evaluation  08/04/18    Authorization Type  medicare  cert period 5093-26/71    PT Start Time  1042    PT Stop Time  1220    PT Time Calculation (min)  98 min    Activity Tolerance  Patient tolerated treatment well    Behavior During Therapy  [facility] for tasks assessed/performed       Past Medical History:  Diagnosis Date  . Anemia   . Anxiety   . Arthritis    "hands, feet" (03/03/2017)  . Brain lesion    2 types  . Cervical spondylosis with myelopathy   . Chest pain    Normal cardiac cath 5/09  . Chronic lower back pain   . CKD (chronic kidney disease), stage III ([place])    [name]  . Congestive heart failure (CHF) ([place])    has a Cardiomems implant   . Constipation   . Dementia ([place])   . Depression   . Dumping syndrome   . Dyspnea    with activity  . Edema   . Fatigue   . Fibromyalgia   . GERD (gastroesophageal reflux disease)   .  Headache    "w/high CBG" (03/03/2017)  . History of echocardiogram    a. Echo 03/20/16 (done at [facility] in [place], [place]):  mild LVH, EF 24%, normal diastolic function, mild LAE, MAC, RVSP 25 mmHg  . Hyperlipidemia   . Hypertension   . Hypothyroidism   . Lumbar spondylosis   . Lymphedema    seeing specialist for this  . Migraine    "mostly stopped when I changed my diet" (03/03/2017)  . OSA on CPAP   . Pneumonia X 1  . PONV (postoperative nausea and vomiting)   . Restless leg syndrome   . Rheumatoid arthritis ([place])   . Stroke [facility])    TIAs "mini strokes"- unsure of last TIA - pt and daughter deny this  . Syncope   . Tremors of nervous system   . Type II diabetes mellitus ([place])     Past Surgical History:  Procedure Laterality Date  . APPENDECTOMY  1966  . BACK SURGERY    . CARDIAC CATHETERIZATION N/A 05/25/2016   Procedure: Right/Left Heart Cath and Coronary Angiography;  Surgeon: Larey Dresser, MD;  Location: [place] [facility];  Service: Cardiovascular;  Laterality: N/A;  . CATARACT EXTRACTION W/ INTRAOCULAR LENS  IMPLANT, BILATERAL Bilateral   . COLONOSCOPY    . CORONARY ANGIOGRAM  2009   Normal coronaries  . EXCISION/RELEASE BURSA HIP Bilateral   . I&D KNEE WITH POLY EXCHANGE Left 03/02/2017   Procedure: Left knee Revision of poly liner;  Surgeon: Mcarthur Rossetti, MD;  Location: [place];  Service: Orthopedics;  Laterality: Left;  . JOINT REPLACEMENT    . KNEE ARTHROSCOPY Bilateral   . LAPAROSCOPIC CHOLECYSTECTOMY  2015  . LUMBAR DISC SURGERY    . LUMBAR LAMINECTOMY/DECOMPRESSION MICRODISCECTOMY Left 12/2005   L2-3 laminectomy and diskectomy/notes 01/06/2011  . POSTERIOR LUMBAR FUSION  04/2006   [name] 01/06/2011; "put cages in"  . REVERSE SHOULDER ARTHROPLASTY Left 06/15/2017   Procedure: REVERSE LEFT SHOULDER ARTHROPLASTY;  Surgeon: Meredith Pel, MD;  Location: [place];  Service: Orthopedics;  Laterality: Left;  . REVERSE SHOULDER ARTHROPLASTY Right  02/01/2018  . REVERSE SHOULDER ARTHROPLASTY Right 02/01/2018   Procedure: RIGHT REVERSE SHOULDER ARTHROPLASTY;  Surgeon: Meredith Pel, MD;  Location: [place];  Service: Orthopedics;  Laterality: Right;  . REVISION TOTAL KNEE ARTHROPLASTY Left 03/02/2017   poly liner/notes 03/02/2017  . RIGHT HEART CATH N/A 11/13/2016   Procedure: Right Heart Cath with Cardiomems;  Surgeon: Larey Dresser, MD;  Location: [place] [facility];  Service: Cardiovascular;  Laterality: N/A;  . SHOULDER ARTHROSCOPY WITH ROTATOR CUFF REPAIR Right   . SHOULDER OPEN ROTATOR CUFF REPAIR Left 01/2011   [name] 01/29/2011  . TOTAL ABDOMINAL HYSTERECTOMY    . TOTAL KNEE ARTHROPLASTY Left   . TOTAL KNEE ARTHROPLASTY Right 11/18/2012   Procedure: TOTAL KNEE ARTHROPLASTY;  Surgeon: Yvette Rack., MD;  Location: [place];  Service: Orthopedics;  Laterality: Right;  . TUBAL LIGATION    . UPPER GI ENDOSCOPY      There were no vitals filed for this visit.         LYMPHEDEMA/ONCOLOGY QUESTIONNAIRE - 07/20/18 1423      What other symptoms do you have   Are you Having Heaviness or Tightness  Yes    Are you having Pain  Yes    Are you having pitting edema  Yes    Is it Hard or Difficult finding clothes that fit  Yes    Do you have infections  No    Other Symptoms  induration       Lymphedema Stage   Stage  STAGE 3 ELEPHANTIASIS      Lymphedema Assessments   Lymphedema Assessments  Lower extremities      Right Lower Extremity Lymphedema   20 cm Proximal to Suprapatella  69.5 cm   was 73 on 10/31 evaluation date   10 cm Proximal to Suprapatella  65.5 cm   was 68.8   At Midpatella/Popliteal Crease  58 cm   was 58.5   30 cm Proximal to Floor at Lateral Plantar Foot  52 cm   was 59.8   20 cm Proximal to Floor at Lateral Plantar Foot  47.6 1   was 53.7   10 cm Proximal to Floor at Lateral Malleoli  35 cm   was 42.5   Circumference of ankle/heel  36.3 cm.   was 40.4   5 cm Proximal to 1st MTP Joint  24.2 cm    was 24.4   Across MTP Joint  23 cm   was 23.5   Around Proximal Great Toe  8 cm   was 7.4     Left Lower Extremity Lymphedema  20 cm Proximal to Suprapatella  80 cm   evalualuation 10/31 was 75   10 cm Proximal to Suprapatella  66 cm   was 72   At Midpatella/Popliteal Crease  56.8 cm   was 59.2   30 cm Proximal to Floor at Lateral Plantar Foot  57.4 cm   was 63   20 cm Proximal to Floor at Lateral Plantar Foot  47 cm   was 54.2   10 cm Proximal to Floor at Lateral Malleoli  38.5 cm   was 44.4   Circumference of ankle/heel  38 cm.   was 39   5 cm Proximal to 1st MTP Joint  23.5 cm   was 24.4   Across MTP Joint  22.4 cm   was 23.5   Around Proximal Great Toe  8 cm   wa 7.5               [facility] Adult PT Treatment/Exercise - 07/20/18 0001      Manual Therapy   Manual Therapy  Manual Lymphatic Drainage (MLD);Compression Bandaging;Other (comment)    Manual therapy comments  completed seperate from all other aspects of treatment.     Manual Lymphatic Drainage (MLD)  supraclavicular, deep and superfical abdominal, routing using inguinal/axillary anastomosis and B LE anterior only due to mobility and time to complete full LE bandaging    Compression Bandaging  for RT  LE full LE"s using 1/2" foam, multilayer short stretch/ Rt LE to knee level only     Other Manual Therapy  measurements for volume bilateral LE's               PT Short Term Goals - 07/13/18 1817      PT SHORT TERM GOAL #1   Title  Pt to reduce LE edma by an average of 3 cm to reduce risk of cellulitis.     Baseline  11/20:  Has lost in some areas up to 3cm    Time  3    Period  Weeks    Status  Partially Met      PT SHORT TERM GOAL #2   Title  PT to be able to walk for over 500 ft with her walker without being fatiqued to maintain I at nursing facility.     Time  3    Period  Weeks    Status  On-going      PT SHORT TERM GOAL #3   Status  On-going        PT Long Term Goals -  07/13/18 1818      PT LONG TERM GOAL #1   Title  Pt to reduce LE volume on an average of 6 cm to allow pt to be able to don her compression garment in comfort.     Time  6    Period  Weeks    Status  On-going      PT LONG TERM GOAL #2   Title  PT to be able to don her pant and shoes with greater ease to be able to be Independent with dressing     Time  6    Period  Weeks    Status  On-going      PT LONG TERM GOAL #3   Title  Pt to be able to ambulate with her walker for over 800 ft to allow pt to go on community outings with her family.     Time  6  Period  Weeks    Status  On-going            Plan - 07/20/18 1426    Clinical Impression Statement  Pt was unable to get her brace last session due to transportation issues. PT will not be able to be compression bandaged and wear the brace at the same time.  PT will need the brace to ambulate therefore we will stop lymphedema therapy at this time.   Rehab Potential  Good    PT Frequency  3x / week    PT Duration  6 weeks    PT Treatment/Interventions  Compression bandaging;Manual lymph drainage;ADLs/Self Care Home Management;Therapeutic exercise    PT Next Visit Plan   Discharge pt    Consulted and Agree with Plan of Care  Patient       Patient will benefit from skilled therapeutic intervention in order to improve the following deficits and impairments:  Decreased activity tolerance, Difficulty walking, Increased edema, Decreased strength  Visit Diagnosis: Difficulty in walking, not elsewhere classified  Lymphedema, not elsewhere classified  Chronic pain of left knee     Problem List Patient Active Problem List   Diagnosis Date Noted  . History of total left knee replacement (TKR) 07/18/2018  . Rectal bleeding 07/05/2018  . Hemorrhoids 07/05/2018  . Anal itching 07/05/2018  . Rotator cuff arthropathy of right shoulder   . Shoulder arthritis 06/15/2017  . Status post revision of total knee replacement, left  04/29/2017  . Polyethylene liner wear following left total knee arthroplasty requiring isolated polyethylene liner exchange ([place]) 03/02/2017  . 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 Unstable angina ([place]) 05/21/2016  . Chronic diastolic heart failure ([place]) 03/29/2016  . Normal coronary arteries 2009 01/14/2015  . Obesity-BMI 40 01/14/2015  . Restless leg 01/14/2015  . Chest pain 01/14/2015  . Back pain 01/14/2015  . Renal insufficiency 01/14/2015  . Dementia- mild memory issues 01/14/2015  . Occult blood positive stool 12/14/2014  . Anemia 12/14/2014  . Family history of colon cancer 12/14/2014  . Change in bowel habits 12/14/2014  . GERD (gastroesophageal reflux disease) 12/14/2014  . Dyspnea 05/17/2012  . Lymphedema 05/17/2012  . Weight gain 05/17/2012  . HTN (hypertension) 05/17/2012   [name], PTA/CLT [place], PT CLT [phone] 07/20/2018, 2:38 PM  [facility] [address] [place], [place], [address] Phone: [phone]   Fax:  [phone]  Name: [name] MRN: [id number] Date of Birth: [birth year]

## 2018-07-25 ENCOUNTER — Telehealth (HOSPITAL_COMMUNITY): Payer: Self-pay | Admitting: Physical Therapy

## 2018-07-25 ENCOUNTER — Ambulatory Visit (HOSPITAL_COMMUNITY): Payer: Medicare Other | Attending: Internal Medicine | Admitting: Physical Therapy

## 2018-07-25 NOTE — Telephone Encounter (Signed)
Pt did not show for appt.  [place] and spoke to [name] who reports she has gone to get her brace from [employer].  Ms [name] also reported she had spoke to [name], [name], who requested she contact our facility and discharge her.  She will have to wear this brace for 6 weeks and it will not fit over her bandages. Explained to Ms [name] that she will have to wear her compression garments every day.  [name] verbalized understanding and stated they had already obtained new ones.   [name], PTA/CLT [phone]

## 2018-07-26 ENCOUNTER — Ambulatory Visit (HOSPITAL_COMMUNITY): Payer: Medicare Other | Admitting: Physical Therapy

## 2018-07-26 ENCOUNTER — Telehealth (HOSPITAL_COMMUNITY): Payer: Self-pay | Admitting: Physical Therapy

## 2018-07-26 NOTE — Telephone Encounter (Signed)
[name] from [facility] call to see if rehab wanted the patient to returnto rehab .Amy said she did'nt need to come back at this time.

## 2018-07-27 ENCOUNTER — Encounter (HOSPITAL_COMMUNITY): Payer: Medicare Other | Admitting: Physical Therapy

## 2018-07-28 ENCOUNTER — Encounter (HOSPITAL_COMMUNITY): Payer: Medicare Other | Admitting: Physical Therapy

## 2018-07-29 ENCOUNTER — Encounter (HOSPITAL_COMMUNITY): Payer: Medicare Other | Admitting: Physical Therapy

## 2018-08-01 ENCOUNTER — Encounter (HOSPITAL_COMMUNITY): Payer: Medicare Other | Admitting: Physical Therapy

## 2018-08-02 ENCOUNTER — Encounter (HOSPITAL_COMMUNITY): Payer: Medicare Other | Admitting: Physical Therapy

## 2018-08-03 ENCOUNTER — Encounter (HOSPITAL_COMMUNITY): Payer: Medicare Other | Admitting: Physical Therapy

## 2018-08-04 ENCOUNTER — Encounter (HOSPITAL_COMMUNITY): Payer: Medicare Other | Admitting: Physical Therapy

## 2018-08-05 ENCOUNTER — Encounter: Payer: Self-pay | Admitting: Gastroenterology

## 2018-08-05 ENCOUNTER — Telehealth (INDEPENDENT_AMBULATORY_CARE_PROVIDER_SITE_OTHER): Payer: Self-pay

## 2018-08-05 ENCOUNTER — Ambulatory Visit (INDEPENDENT_AMBULATORY_CARE_PROVIDER_SITE_OTHER): Payer: Medicare Other | Admitting: Gastroenterology

## 2018-08-05 ENCOUNTER — Encounter (HOSPITAL_COMMUNITY): Payer: Medicare Other | Admitting: Physical Therapy

## 2018-08-05 ENCOUNTER — Other Ambulatory Visit: Payer: Medicare Other

## 2018-08-05 VITALS — BP 130/74 | HR 77 | Ht 66.0 in | Wt 275.0 lb

## 2018-08-05 DIAGNOSIS — L290 Pruritus ani: Secondary | ICD-10-CM

## 2018-08-05 DIAGNOSIS — E611 Iron deficiency: Secondary | ICD-10-CM

## 2018-08-05 DIAGNOSIS — K649 Unspecified hemorrhoids: Secondary | ICD-10-CM | POA: Diagnosis not present

## 2018-08-05 DIAGNOSIS — K625 Hemorrhage of anus and rectum: Secondary | ICD-10-CM | POA: Diagnosis not present

## 2018-08-05 MED ORDER — PEG 3350-KCL-NA BICARB-NACL 420 G PO SOLR: 4000.0000 mL | ORAL | 0 refills | Status: DC

## 2018-08-05 NOTE — Progress Notes (Signed)
[facility] VISIT   Primary Care Provider Hilbert Corrigan, [place] [address] [phone]  Patient Profile: [name] is a 72 y.o. female with a pmh significant for RA, Cervical Spondylosis, CRI, Dementia, MDD/Anxiety, GERD, HTN, HLD, lymphedema, hypothyroidism, OSA, previous TIAs, DM.  The patient presents to the [facility] for an evaluation and management of problem(s) noted below:  Problem List 1. Rectal bleeding   2. Anal itching   3. Hemorrhoids, unspecified hemorrhoid type   4. Iron deficiency     History of Present Illness: Please see initial consultation note for full details of HPI.  Interval History:  The patient returns for scheduled follow-up.  We had found a mild iron deficiency on her last set of labs and asked her to be initiated on iron daily.  There is an Environmental consultant from [place] with her today.  Review of the patient's notation suggests that she has been receiving MiraLAX as well as fiber supplementation 2 times daily with FiberCon as well as stool softeners.  The patient states that she also had iron studies that I had wanted performed done at the facility however we do not have access to any of those records as of yet did not come with her.  However she states that the labs were reported to her as showing better iron studies than before.  I am hopeful that iron levels have increased.  Unfortunately she is not able to do any sits baths which we asked last time around.  She is still complaining of pruritus of her rectum.  She states that a stool study was sent however we never received/obtained it to be sent for parasite evaluation to rule out whipworm.  She continues to have issues with rectal bleeding.  Patient not complaining of abdominal discomfort or pain currently.  GI Review of Systems Positive as above including persistent bloating Negative for odynophagia, dysphagia, abdominal  pain, jaundice  Review of Systems General: Reports a stable weight and denies fevers or chills Cardiovascular: Denies chest pain Pulmonary: Stable shortness of breath Gastroenterological: See HPI Genitourinary: Denies darkened urine Hematological: Denies easy bruising Psychological: Mood is anxious about wanting her itching to improve   Medications Current Outpatient Medications  Medication Sig Dispense Refill  . acetaminophen (TYLENOL) 650 MG CR tablet Take 650 mg by mouth every 4 (four) hours as needed for pain.    Marland Kitchen amLODipine (NORVASC) 10 MG tablet Take 1 tablet (10 mg total) by mouth daily. 30 tablet 6  . antiseptic oral rinse (BIOTENE) LIQD 10 mLs by Mouth Rinse route 4 (four) times daily. (0900, 1000, 1200 & 2100)    . aspirin EC 81 MG tablet Take 1 tablet (81 mg total) by mouth daily. 90 tablet 3  . baclofen (LIORESAL) 10 MG tablet Take 10 mg by mouth 3 (three) times daily. (0900, 1400, & 2100)    . budesonide (PULMICORT) 0.5 MG/2ML nebulizer solution Take 0.5 mg by nebulization 2 (two) times daily. (1000 & 2200)    . Cholecalciferol (VITAMIN D3) 1000 units CAPS Take 1,000 Units by mouth daily. (0900)    . cloNIDine (CATAPRES) 0.1 MG tablet Take 0.1 mg by mouth 2 (two) times daily. (0900 & 2100)    . Cyanocobalamin (VITAMIN B-12 IJ) Inject 1,000 mcg as directed every 30 (thirty) days. Weekly for 4 weeks, has 1 more dose this week (week of 7/2) then will switch to once a month     . cyclobenzaprine (  FLEXERIL) 5 MG tablet Take 10 mg by mouth 2 (two) times daily. (0800 & 2100)    . donepezil (ARICEPT) 5 MG tablet Take 5 mg by mouth daily at 10 pm. (2100)    . DULoxetine (CYMBALTA) 30 MG capsule Take 30 mg by mouth daily. (0900)    . ferrous gluconate (FERGON) 324 MG tablet Take 1 tablet (324 mg total) by mouth 2 (two) times daily with a meal. 84 tablet 0  . gabapentin (NEURONTIN) 300 MG capsule Take 400 mg by mouth 2 (two) times daily. (0900 & 2100)    . HYDROcodone-acetaminophen  (NORCO) 7.5-325 MG tablet Take 1 tablet by mouth every 6 (six) hours as needed.     Marland Kitchen ipratropium-albuterol (DUONEB) 0.5-2.5 (3) MG/3ML SOLN Take 3 mLs by nebulization every 6 (six) hours as needed (FOR WHEEZING/SHORTNESS OF BREATH).    Marland Kitchen lamoTRIgine (LAMICTAL) 100 MG tablet Take 100 mg by mouth 2 (two) times daily. (0900 & 2100)    . levothyroxine (SYNTHROID, LEVOTHROID) 88 MCG tablet Take 88 mcg by mouth daily before breakfast. (0900)    . Melatonin 3 MG CAPS Take 3 mg by mouth at bedtime. (2100)    . omeprazole (PRILOSEC) 20 MG capsule Take 20 mg by mouth 2 (two) times daily before a meal. (0900 & 1700)    . polyethylene glycol-electrolytes (NULYTELY/GOLYTELY) 420 g solution Take 4,000 mLs by mouth as directed. 4000 mL 0  . pravastatin (PRAVACHOL) 40 MG tablet Take 40 mg by mouth daily. (2200)    . rOPINIRole (REQUIP) 2 MG tablet Take 3 mg by mouth 4 (four) times daily. After breakfast, lunch, dinner and at bedtime    . sennosides-docusate sodium (SENOKOT-S) 8.6-50 MG tablet Take 1 tablet by mouth 2 (two) times daily as needed. (0900 & 2100)    . torsemide (DEMADEX) 20 MG tablet Take 4 tablets (80 mg total) by mouth daily. 120 tablet 3  . traZODone (DESYREL) 50 MG tablet Take 50 mg by mouth at bedtime. (2100)     No current facility-administered medications for this visit.     Allergies No Known Allergies  Histories Past Medical History:  Diagnosis Date  . Anemia   . Anxiety   . Arthritis    "hands, feet" (03/03/2017)  . Brain lesion    2 types  . Cervical spondylosis with myelopathy   . Chest pain    Normal cardiac cath 5/09  . Chronic lower back pain   . CKD (chronic kidney disease), stage III ([place])    [name]  . Congestive heart failure (CHF) ([place])    has a Cardiomems implant   . Constipation   . Dementia ([place])   . Depression   . Dumping syndrome   . Dyspnea    with activity  . Edema   . Fatigue   . Fibromyalgia   . GERD (gastroesophageal reflux  disease)   . Headache    "w/high CBG" (03/03/2017)  . History of echocardiogram    a. Echo 03/20/16 (done at [facility] in [place], [place]):  mild LVH, EF 81%, normal diastolic function, mild LAE, MAC, RVSP 25 mmHg  . Hyperlipidemia   . Hypertension   . Hypothyroidism   . Lumbar spondylosis   . Lymphedema    seeing specialist for this  . Migraine    "mostly stopped when I changed my diet" (03/03/2017)  . OSA on CPAP   . Pneumonia X 1  . PONV (postoperative nausea and vomiting)   .  Restless leg syndrome   . Rheumatoid arthritis ([place])   . Stroke [facility])    TIAs "mini strokes"- unsure of last TIA - pt and daughter deny this  . Syncope   . Tremors of nervous system   . Type II diabetes mellitus ([place])    Past Surgical History:  Procedure Laterality Date  . APPENDECTOMY  1966  . BACK SURGERY    . CARDIAC CATHETERIZATION N/A 05/25/2016   Procedure: Right/Left Heart Cath and Coronary Angiography;  Surgeon: Larey Dresser, MD;  Location: [place] [facility];  Service: Cardiovascular;  Laterality: N/A;  . CATARACT EXTRACTION W/ INTRAOCULAR LENS  IMPLANT, BILATERAL Bilateral   . COLONOSCOPY    . CORONARY ANGIOGRAM  2009   Normal coronaries  . EXCISION/RELEASE BURSA HIP Bilateral   . I&D KNEE WITH POLY EXCHANGE Left 03/02/2017   Procedure: Left knee Revision of poly liner;  Surgeon: Mcarthur Rossetti, MD;  Location: [facility];  Service: Orthopedics;  Laterality: Left;  . JOINT REPLACEMENT    . KNEE ARTHROSCOPY Bilateral   . LAPAROSCOPIC CHOLECYSTECTOMY  2015  . LUMBAR DISC SURGERY    . LUMBAR LAMINECTOMY/DECOMPRESSION MICRODISCECTOMY Left 12/2005   L2-3 laminectomy and diskectomy/notes 01/06/2011  . POSTERIOR LUMBAR FUSION  04/2006   [name] 01/06/2011; "put cages in"  . REVERSE SHOULDER ARTHROPLASTY Left 06/15/2017   Procedure: REVERSE LEFT SHOULDER ARTHROPLASTY;  Surgeon: Meredith Pel, MD;  Location: [facility];  Service: Orthopedics;  Laterality: Left;  . REVERSE SHOULDER ARTHROPLASTY  Right 02/01/2018  . REVERSE SHOULDER ARTHROPLASTY Right 02/01/2018   Procedure: RIGHT REVERSE SHOULDER ARTHROPLASTY;  Surgeon: Meredith Pel, MD;  Location: [facility];  Service: Orthopedics;  Laterality: Right;  . REVISION TOTAL KNEE ARTHROPLASTY Left 03/02/2017   poly liner/notes 03/02/2017  . RIGHT HEART CATH N/A 11/13/2016   Procedure: Right Heart Cath with Cardiomems;  Surgeon: Larey Dresser, MD;  Location: [facility];  Service: Cardiovascular;  Laterality: N/A;  . SHOULDER ARTHROSCOPY WITH ROTATOR CUFF REPAIR Right   . SHOULDER OPEN ROTATOR CUFF REPAIR Left 01/2011   [name] 01/29/2011  . TOTAL ABDOMINAL HYSTERECTOMY    . TOTAL KNEE ARTHROPLASTY Left   . TOTAL KNEE ARTHROPLASTY Right 11/18/2012   Procedure: TOTAL KNEE ARTHROPLASTY;  Surgeon: Yvette Rack., MD;  Location: [facility];  Service: Orthopedics;  Laterality: Right;  . TUBAL LIGATION    . UPPER GI ENDOSCOPY     Social History   Socioeconomic History  . Marital status: Widowed    Spouse name: Not on file  . Number of children: 3  . Years of education: Not on file  . Highest education level: Not on file  Occupational History  . Occupation: retired  Scientific laboratory technician  . Financial resource strain: Not on file  . Food insecurity:    Worry: Not on file    Inability: Not on file  . Transportation needs:    Medical: Not on file    Non-medical: Not on file  Tobacco Use  . Smoking status: Never Smoker  . Smokeless tobacco: Never Used  Substance and Sexual Activity  . Alcohol use: No    Alcohol/week: 0.0 standard drinks  . Drug use: No  . Sexual activity: Not Currently  Lifestyle  . Physical activity:    Days per week: Not on file    Minutes per session: Not on file  . Stress: Not on file  Relationships  . Social connections:    Talks on phone: Not  on file    Gets together: Not on file    Attends religious service: Not on file    Active member of club or organization: Not on file    Attends meetings of clubs or  organizations: Not on file    Relationship status: Not on file  . Intimate partner violence:    Fear of current or ex partner: Not on file    Emotionally abused: Not on file    Physically abused: Not on file    Forced sexual activity: Not on file  Other Topics Concern  . Not on file  Social History Narrative   Pt does use caffeine. Lives alone. 3 children. Retired.   Family History  Problem Relation Age of Onset  . Heart disease Mother   . Heart failure Mother   . Kidney disease Mother   . Lung cancer Father   . Colon cancer Brother   . Cancer Brother        malignant thymoma  . Heart disease Brother   . Tremor Maternal Grandmother 90  . Ovarian cancer Maternal Grandmother   . Uterine cancer Maternal Grandmother   . Irritable bowel syndrome Daughter   . Esophageal cancer Neg Hx   . Stomach cancer Neg Hx   . Inflammatory bowel disease Neg Hx   . Liver disease Neg Hx   . Pancreatic cancer Neg Hx    I have reviewed her medical, social, and family history in detail and updated the electronic medical record as necessary.    PHYSICAL EXAMINATION  BP 130/74   Pulse 77   Ht 5' 6"  (1.676 m)   Wt 275 lb (124.7 kg)   SpO2 93%   BMI 44.39 kg/m  Wt Readings from Last 3 Encounters:  08/05/18 275 lb (124.7 kg)  06/28/18 278 lb (126.1 kg)  06/16/18 278 lb 12.8 oz (126.5 kg)  GEN: NAD, appears older than stated age, appears chronically ill but nontoxic, accompanied by aide from [place]: Cooperative, without pressured speech EYE: Conjunctivae pink, sclerae anicteric ENT: Moist mucous membranes CV: Without rubs or gallops RESP: Decreased breath sounds at the bases but no adventitious sounds GI: Obese, rounded, soft, protuberant, without guarding GU: DRE was deferred as she had previous evidence of external hemorrhoids and likely internal hemorrhoids on prior exam MSK/EXT: Bilateral lower extremity edema seems improved from prior SKIN: No jaundice NEURO:  Alert &  Oriented x 3, no focal deficits   REVIEW OF DATA  I reviewed the following data at the time of this encounter:  GI Procedures and Studies  No new GI procedures to review  Laboratory Studies  Reviewed in epic Did not receive labs from [facility] we will try to obtain those  Imaging Studies  No relevant studies   ASSESSMENT  Ms. Thorns is a 72 y.o. female with a pmh significant for RA, Cervical Spondylosis, CRI, Dementia, MDD/Anxiety, GERD, HTN, HLD, lymphedema, hypothyroidism, OSA, previous TIAs, DM.  The patient is seen today for evaluation and management of:  1. Rectal bleeding   2. Anal itching   3. Hemorrhoids, unspecified hemorrhoid type   4. Iron deficiency    The patient continues to have issues of rectal bleeding.  She had a bit of iron deficiency when last checked.  I had wanted her to come in for labs a few days before to see what they were at the time of her clinic visit today and decide next steps.  With that being said we  will try and obtain labs that was done to ensure that her iron indices improved as well as blood count.  In regards to the rectal bleeding I still believe that this however as we have not had a evaluation of her colon in almost 4 years and she did not have noted hemorrhoids on that exam I think it is important that we rule out other etiologies although her external exam.  The area last go around did show external hemorrhoids and likely internal hemorrhoids.  I would like the patient to continue taking MiraLAX 1-2 times daily as well as her fiber supplements and stool softeners.  I would like the facility to try and give the patient an opportunity to do a sitz bath 2-3 times daily.  I also like to obtain the ova parasite stool study that I asked for previous.  We will plan a diagnostic upper and lower endoscopy patient in regards to iron deficiency anemia evaluation.  Further work-up and consideration of hemorrhoidal banding for pruritus a 9-day be considered  however we want to ensure were not missing anything else and optimize her stools before I consider sending her to 1 of my partners.  The patient was frustrated today by this but appreciative for [place] trying to discuss things with her.  All patient questions were answered, to the best of my ability, and the patient agrees to the aforementioned plan of action with follow-up as indicated.   PLAN  Continue FiberCon 2 times daily Continue stool softeners 1-2 times daily Continue MiraLAX 1-2 times daily to have at least one soft bowel movement per day When asked to restart which hazel pads as needed Okay to use PR ointment Preparation H for patient Strongly advised doing sitz baths 2-3 times daily We will try to obtain iron studies that were drawn per report and [facility] an ova and parasite to rule out with formed via stool study Plan for diagnostic endoscopy/colonoscopy for evaluation of iron deficiency as well as continued rectal bleeding and pruritus a night- Fibercon 1-2 times daily to be initiated  Orders Placed This Encounter  Procedures  . Ova and parasite examination  . Ambulatory referral to Gastroenterology    New Prescriptions   POLYETHYLENE GLYCOL-ELECTROLYTES (NULYTELY/GOLYTELY) 420 G SOLUTION    Take 4,000 mLs by mouth as directed.   Modified Medications   No medications on file    Planned Follow Up: No follow-ups on file.   Justice Britain, MD [facility] Office # [phone]

## 2018-08-05 NOTE — Patient Instructions (Signed)
You have been scheduled for a colonoscopy. Please follow written instructions given to you at your visit today.  Please pick up your prep supplies at the pharmacy within the next 1-3 days. If you use inhalers (even only as needed), please bring them with you on the day of your procedure. Your physician has requested that you go to www.startemmi.com and enter the access code given to you at your visit today. This web site gives a general overview about your procedure. However, you should still follow specific instructions given to you by our office regarding your preparation for the procedure.  Your provider has requested that you go to the basement level for lab work before leaving today. Press "B" on the elevator. The lab is located at the first door on the left as you exit the elevator.  Please fax back the lab results to [phone] studies)

## 2018-08-05 NOTE — Telephone Encounter (Signed)
Caryl Pina, NP at [facility] called concerning a referral to PT.  Riccardo Dubin that there was no referral in patient's chart for PT.

## 2018-08-06 ENCOUNTER — Encounter: Payer: Self-pay | Admitting: Gastroenterology

## 2018-08-06 DIAGNOSIS — E611 Iron deficiency: Secondary | ICD-10-CM | POA: Insufficient documentation

## 2018-08-08 ENCOUNTER — Encounter (HOSPITAL_COMMUNITY): Payer: Medicare Other | Admitting: Physical Therapy

## 2018-08-09 ENCOUNTER — Other Ambulatory Visit: Payer: Medicare Other

## 2018-08-09 ENCOUNTER — Encounter (HOSPITAL_COMMUNITY): Payer: Medicare Other | Admitting: Physical Therapy

## 2018-08-09 DIAGNOSIS — K625 Hemorrhage of anus and rectum: Secondary | ICD-10-CM

## 2018-08-10 ENCOUNTER — Encounter (HOSPITAL_COMMUNITY): Payer: Medicare Other | Admitting: Physical Therapy

## 2018-08-11 ENCOUNTER — Encounter (HOSPITAL_COMMUNITY): Payer: Medicare Other | Admitting: Physical Therapy

## 2018-08-11 ENCOUNTER — Ambulatory Visit (INDEPENDENT_AMBULATORY_CARE_PROVIDER_SITE_OTHER): Payer: No Typology Code available for payment source | Admitting: Orthopedic Surgery

## 2018-08-12 ENCOUNTER — Ambulatory Visit (INDEPENDENT_AMBULATORY_CARE_PROVIDER_SITE_OTHER): Payer: Medicare Other | Admitting: Orthopedic Surgery

## 2018-08-15 ENCOUNTER — Encounter (HOSPITAL_COMMUNITY): Payer: Medicare Other | Admitting: Physical Therapy

## 2018-08-16 ENCOUNTER — Encounter (HOSPITAL_COMMUNITY): Payer: Medicare Other | Admitting: Physical Therapy

## 2018-08-17 LAB — OVA AND PARASITE EXAMINATION
CONCENTRATE RESULT: NONE SEEN
SPECIMEN QUALITY: ADEQUATE
TRICHROME RESULT:: NONE SEEN
VKL: 91508582

## 2018-08-18 ENCOUNTER — Ambulatory Visit (HOSPITAL_COMMUNITY): Payer: Medicare Other | Admitting: Physical Therapy

## 2018-08-19 ENCOUNTER — Encounter (HOSPITAL_COMMUNITY): Payer: Medicare Other | Admitting: Physical Therapy

## 2018-08-22 ENCOUNTER — Encounter (HOSPITAL_COMMUNITY): Payer: Medicare Other | Admitting: Physical Therapy

## 2018-08-23 ENCOUNTER — Encounter (HOSPITAL_COMMUNITY): Payer: Medicare Other | Admitting: Physical Therapy

## 2018-08-25 ENCOUNTER — Encounter (HOSPITAL_COMMUNITY): Payer: Medicare Other | Admitting: Physical Therapy

## 2018-08-26 ENCOUNTER — Telehealth (HOSPITAL_COMMUNITY): Payer: Self-pay | Admitting: Adult Health

## 2018-08-26 ENCOUNTER — Encounter (HOSPITAL_COMMUNITY): Payer: Medicare Other | Admitting: Physical Therapy

## 2018-08-26 ENCOUNTER — Encounter (HOSPITAL_COMMUNITY): Payer: Self-pay | Admitting: Adult Health

## 2018-08-26 NOTE — Telephone Encounter (Signed)
Spoke to the nursing staff at [facility] regarding elevated Cardiomems.   I have instructed them to give an extra 20 mg torsemide for the next 2 days.   Imanie Darrow NP-C  3:06 PM

## 2018-08-29 ENCOUNTER — Encounter (HOSPITAL_COMMUNITY): Payer: Medicare Other | Admitting: Physical Therapy

## 2018-08-29 ENCOUNTER — Ambulatory Visit (INDEPENDENT_AMBULATORY_CARE_PROVIDER_SITE_OTHER): Payer: Medicare Other | Admitting: Orthopedic Surgery

## 2018-08-30 ENCOUNTER — Encounter (HOSPITAL_COMMUNITY): Payer: Medicare Other | Admitting: Physical Therapy

## 2018-08-31 ENCOUNTER — Encounter (HOSPITAL_COMMUNITY): Payer: Medicare Other | Admitting: Physical Therapy

## 2018-09-01 ENCOUNTER — Encounter (HOSPITAL_COMMUNITY): Payer: Medicare Other | Admitting: Physical Therapy

## 2018-09-01 ENCOUNTER — Encounter: Payer: Self-pay | Admitting: Gastroenterology

## 2018-09-01 ENCOUNTER — Ambulatory Visit (AMBULATORY_SURGERY_CENTER): Payer: Medicare Other | Admitting: Gastroenterology

## 2018-09-01 VITALS — BP 92/66 | HR 81 | Temp 99.1°F | Resp 16 | Ht 66.0 in | Wt 275.0 lb

## 2018-09-01 DIAGNOSIS — K3189 Other diseases of stomach and duodenum: Secondary | ICD-10-CM

## 2018-09-01 DIAGNOSIS — K5289 Other specified noninfective gastroenteritis and colitis: Secondary | ICD-10-CM

## 2018-09-01 DIAGNOSIS — K641 Second degree hemorrhoids: Secondary | ICD-10-CM

## 2018-09-01 DIAGNOSIS — E611 Iron deficiency: Secondary | ICD-10-CM

## 2018-09-01 DIAGNOSIS — K317 Polyp of stomach and duodenum: Secondary | ICD-10-CM

## 2018-09-01 DIAGNOSIS — D120 Benign neoplasm of cecum: Secondary | ICD-10-CM

## 2018-09-01 DIAGNOSIS — K625 Hemorrhage of anus and rectum: Secondary | ICD-10-CM

## 2018-09-01 MED ORDER — SODIUM CHLORIDE 0.9 % IV SOLN: 500.0000 mL | Freq: Once | INTRAVENOUS | Status: DC

## 2018-09-01 NOTE — Op Note (Signed)
[facility] Patient Name: [name] Procedure Date: 09/01/2018 2:34 PM MRN: [id number] Endoscopist: Justice Britain , MD Age: 73 Referring MD:  Date of Birth: 1946-03-06 Gender: Female Account #: [id number] Procedure:                Colonoscopy Indications:              Rectal bleeding, Change in bowel habits, Fecal                            incontinence, Rectal pain, Anal Pruritus Medicines:                Monitored Anesthesia Care Procedure:                Pre-Anesthesia Assessment:                           - Prior to the procedure, a History and Physical                            was performed, and patient medications and                            allergies were reviewed. The patient's tolerance of                            previous anesthesia was also reviewed. The risks                            and benefits of the procedure and the sedation                            options and risks were discussed with the patient.                            All questions were answered, and informed consent                            was obtained. Prior Anticoagulants: The patient has                            taken aspirin. ASA Grade Assessment: III - A                            patient with severe systemic disease. After                            reviewing the risks and benefits, the patient was                            deemed in satisfactory condition to undergo the                            procedure.  After obtaining informed consent, the colonoscope                            was passed under direct vision. Throughout the                            procedure, the patient's blood pressure, pulse, and                            oxygen saturations were monitored continuously. The                            Colonoscope was introduced through the anus and                            advanced to the 6 cm into the ileum. The                             colonoscopy was performed without difficulty. The                            quality of the bowel preparation was evaluated                            using the BBPS [facility] Bowel Preparation Scale)                            with scores of: Right Colon = 2 (minor amount of                            residual staining, small fragments of stool and/or                            opaque liquid, but mucosa seen well), Transverse                            Colon = 2 (minor amount of residual staining, small                            fragments of stool and/or opaque liquid, but mucosa                            seen well) and Left Colon = 3 (entire mucosa seen                            well with no residual staining, small fragments of                            stool or opaque liquid). The total BBPS score                            equals 7. The quality of the bowel preparation was  fair. Scope In: 3:09:45 PM Scope Out: 3:36:59 PM Scope Withdrawal Time: 0 hours 21 minutes 36 seconds  Total Procedure Duration: 0 hours 27 minutes 14 seconds  Findings:                 The digital rectal exam findings include                            non-thrombosed internal hemorrhoids. Pertinent                            negatives include no palpable rectal lesions.                           The terminal ileum and ileocecal valve appeared                            normal. Biopsies were taken with a cold forceps for                            histology to rule out enteropathy.                           A moderate amount of semi-liquid stool was found in                            the entire colon, interfering with visualization.                            Lavage of the area was performed using copious                            amounts, resulting in clearance with fair                            visualization.                           A 5 mm polyp was found in the cecum.  The polyp was                            sessile. The polyp was removed with a cold snare.                            Resection and retrieval were complete.                           Normal mucosa was found in the entire colon.                            Biopsies were taken with a cold forceps for                            histology to rule out microscopic/collagenous  colitis.                           Normal mucosa was found in the rectum. Biopsies                            were taken with a cold forceps for histology to                            rule out proctitis.                           Multiple small-mouthed diverticula were found in                            the recto-sigmoid colon and sigmoid colon. No                            evidence of SCAD.                           Non-bleeding non-thrombosed internal hemorrhoids                            were found during retroflexion, during perianal                            exam and during digital exam. The hemorrhoids were                            Grade II (internal hemorrhoids that prolapse but                            reduce spontaneously). Complications:            No immediate complications. Estimated Blood Loss:     Estimated blood loss was minimal. Impression:               - Preparation of the colon was fair after copious                            lavage.                           - Non-thrombosed internal hemorrhoids found on                            digital rectal exam.                           - The examined portion of the ileum was normal.                            Biopsied.                           - One 5 mm polyp in the cecum, removed with a cold  snare. Resected and retrieved.                           - Normal mucosa in the entire examined colon.                            Biopsied.                           - Normal mucosa in the rectum.  Biopsied.                           - Diverticulosis in the recto-sigmoid colon and in                            the sigmoid colon.                           - Non-bleeding non-thrombosed internal hemorrhoids. Recommendation:           - The patient will be observed post-procedure,                            until all discharge criteria are met.                           - Discharge patient to home.                           - Patient has a contact number available for                            emergencies. The signs and symptoms of potential                            delayed complications were discussed with the                            patient. Return to normal activities tomorrow.                            Written discharge instructions were provided to the                            patient.                           - High fiber diet.                           - Continue taking Benefiber or Fibercon twice daily                            as a soluble fiber in efforts of bulking the stool.                           -  May consider Anusol Suppositories every other                            evening in effort of decreasing inflammation in the                            rectum. Would not use greater than 3-4 weeks and if                            patient has improvement then would consider                            decreasing to every 72 hours.                           - Preparation H anti-itch cream: Apply externally                            TID.                           - Patient should be able to have Sitz Bathes                            performed 2-3 times daily in order to optimize her                            hemorrhoids.                           - Banding protocol unlikely to be helpful for just                            anal pruritus.                           - Repeat colonoscopy in 5-years for surveillance                            based on pathology results  and findings of                            adenomatous tissue, but due to only fair                            preparation after significant lavage could be                            considered for repeat at that time; this is if                            medical comorbidities allow continued                            screening/surveillance.                           -  We will schedule patient for follow up in clinic                            in 3-4 weeks.                           - The findings and recommendations were discussed                            with the patient.                           - The findings and recommendations were discussed                            with the patient's family. Justice Britain, MD 09/01/2018 4:12:48 PM

## 2018-09-01 NOTE — Patient Instructions (Addendum)
YOU HAD AN ENDOSCOPIC PROCEDURE TODAY AT [facility]:   Refer to the procedure report that was given to you for any specific questions about what was found during the examination.  If the procedure report does not answer your questions, please call your gastroenterologist to clarify.  If you requested that your care partner not be given the details of your procedure findings, then the procedure report has been included in a sealed envelope for you to review at your convenience later.  YOU SHOULD EXPECT: Some feelings of bloating in the abdomen. Passage of more gas than usual.  Walking can help get rid of the air that was put into your GI tract during the procedure and reduce the bloating. If you had a lower endoscopy (such as a colonoscopy or flexible sigmoidoscopy) you may notice spotting of blood in your stool or on the toilet paper. If you underwent a bowel prep for your procedure, you may not have a normal bowel movement for a few days.  Please Note:  You might notice some irritation and congestion in your nose or some drainage.  This is from the oxygen used during your procedure.  There is no need for concern and it should clear up in a day or so.  SYMPTOMS TO REPORT IMMEDIATELY:   Following lower endoscopy (colonoscopy or flexible sigmoidoscopy):  Excessive amounts of blood in the stool  Significant tenderness or worsening of abdominal pains  Swelling of the abdomen that is new, acute  Fever of 100F or higher   Following upper endoscopy (EGD)  Vomiting of blood or coffee ground material  New chest pain or pain under the shoulder blades  Painful or persistently difficult swallowing  New shortness of breath  Fever of 100F or higher  Black, tarry-looking stools  For urgent or emergent issues, a gastroenterologist can be reached at any hour by calling [phone].   DIET:  We do recommend a small meal at first, but then you may proceed to your regular diet.  Drink  plenty of fluids but you should avoid alcoholic beverages for 24 hours.  ACTIVITY:  You should plan to take it easy for the rest of today and you should NOT DRIVE or use heavy machinery until tomorrow (because of the sedation medicines used during the test).    FOLLOW UP: Our staff will call the number listed on your records the next business day following your procedure to check on you and address any questions or concerns that you may have regarding the information given to you following your procedure. If we do not reach you, we will leave a message.  However, if you are feeling well and you are not experiencing any problems, there is no need to return our call.  We will assume that you have returned to your regular daily activities without incident.  If any biopsies were taken you will be contacted by phone or by letter within the next 1-3 weeks.  Please call us at [phone] if you have not heard about the biopsies in 3 weeks.    SIGNATURES/CONFIDENTIALITY: You and/or your care partner have signed paperwork which will be entered into your electronic medical record.  These signatures attest to the fact that that the information above on your After Visit Summary has been reviewed and is understood.  Full responsibility of the confidentiality of this discharge information lies with you and/or your care-partner.    Handouts were given to your care partner on polyps,  diverticulosis, hemorrhoids, Gastritis and a high fiber diet with liberal fluid intake. Increase OMEPRAZOLE 40 mg to 2 x per day for the next 21-months. Will call you to set up an appointment to repeat EGD in 54-months.  The schedule is not out that far now, will call you to set up this appointment. You may resume your current medications today. Continue taking Benefiber or Fibercon twice daily as a soluble fiber in efforts of bulking the stool. May consider Anusol Suppositories every other evening in efforts of decreasing  inflammation in the rectum.  Would not use greater than 3-4 weeks and if patient has improvement then would consider decreasing to every 72 hours. Preparation H anti-itch cream: Apply externally 3 x per day. Patient should be able to have Sitz Bathes performed 2-3 x daily in order to optimize her hemorrhoids. Await biopsy results. Will call you with an appointment to follow up in the office with Dr. Rush Landmark in 3-4 weeks. Please call if any questions or concerns.

## 2018-09-01 NOTE — Progress Notes (Signed)
PT taken to PACU. Monitors in place. VSS. Report given to RN. 

## 2018-09-01 NOTE — Progress Notes (Signed)
No problems noted in the recovery room. Maw   Pt's daughter and I  assisted pt with dressing.  maw

## 2018-09-01 NOTE — Op Note (Signed)
[facility] Patient Name: [name] Procedure Date: 09/01/2018 2:34 PM MRN: [id number] Endoscopist: Justice Britain , MD Age: 73 Referring MD:  Date of Birth: 02-Jan-1946 Gender: Female Account #: [id number] Procedure:                Upper GI endoscopy Indications:              Iron deficiency anemia, Diarrhea, Change in bowel                            habits Medicines:                Monitored Anesthesia Care Procedure:                Pre-Anesthesia Assessment:                           - Prior to the procedure, a History and Physical                            was performed, and patient medications and                            allergies were reviewed. The patient's tolerance of                            previous anesthesia was also reviewed. The risks                            and benefits of the procedure and the sedation                            options and risks were discussed with the patient.                            All questions were answered, and informed consent                            was obtained. Prior Anticoagulants: The patient has                            taken aspirin. ASA Grade Assessment: III - A                            patient with severe systemic disease. After                            reviewing the risks and benefits, the patient was                            deemed in satisfactory condition to undergo the                            procedure.  After obtaining informed consent, the endoscope was                            passed under direct vision. Throughout the                            procedure, the patient's blood pressure, pulse, and                            oxygen saturations were monitored continuously. The                            Model GIF-HQ190 [id number]) scope was introduced                            through the mouth, and advanced to the second part                            of  duodenum. The upper GI endoscopy was                            accomplished without difficulty. The patient                            tolerated the procedure. Scope In: Scope Out: Findings:                 No gross lesions were noted in the entire                            esophagus. Biopsies were taken with a cold forceps                            for histology.                           Multiple diminutive sessile polyps were found in                            the gastric fundus, in the gastric body and in the                            gastric antrum. Biopsies were taken with a cold                            forceps for histology.                           Multiple dispersed, small non-bleeding erosions                            were found in the gastric antrum. There were no                            stigmata of recent bleeding.  No gross lesions were noted in the entire examined                            stomach. Biopsies were taken with a cold forceps                            for histology and Helicobacter pylori testing.                           No gross lesions were noted in the duodenal bulb,                            in the first portion of the duodenum and in the                            second portion of the duodenum. Biopsies for                            histology were taken with a cold forceps for                            evaluation of celiac disease. Complications:            No immediate complications. Estimated Blood Loss:     Estimated blood loss was minimal. Impression:               - No gross lesions in esophagus. Biopsied.                           - Multiple gastric polyps. Biopsied.                           - Non-bleeding erosive gastropathy.                           - No gross lesions in the stomach. Biopsied.                           - No gross lesions in the duodenal bulb, in the                            first  portion of the duodenum and in the second                            portion of the duodenum. Biopsied. Recommendation:           - Proceed to scheduled colonoscopy.                           - Observe patient's clinical course.                           - Await pathology results.                           -  Increase to Omeprazole 40 mg BID for next                            77-months.                           - Will plan to repeat EGD in 80-months to ensure                            healing of gastritis.                           - The findings and recommendations were discussed                            with the patient.                           - The findings and recommendations were discussed                            with the patient's family. Justice Britain, MD 09/01/2018 4:02:19 PM

## 2018-09-01 NOTE — Progress Notes (Signed)
Called to room to assist during endoscopic procedure.  Patient ID and intended procedure confirmed with present staff. Received instructions for my participation in the procedure from the performing physician.  

## 2018-09-02 ENCOUNTER — Encounter (HOSPITAL_COMMUNITY): Payer: Medicare Other | Admitting: Physical Therapy

## 2018-09-02 ENCOUNTER — Telehealth: Payer: Self-pay

## 2018-09-02 NOTE — Telephone Encounter (Signed)
  Follow up Call-  Call back number 09/01/2018  Post procedure Call Back phone  # [phone]  Permission to leave phone message Yes  Some recent data might be hidden     Patient questions:  Do you have a fever, pain , or abdominal swelling? No. Pain Score  0 *  Have you tolerated food without any problems? Yes.    Have you been able to return to your normal activities? Yes.    Do you have any questions about your discharge instructions: Diet   No. Medications  No. Follow up visit  No.  Do you have questions or concerns about your Care? No.  Actions: * If pain score is 4 or above: No action needed, pain <4.

## 2018-09-05 ENCOUNTER — Encounter (HOSPITAL_COMMUNITY): Payer: Medicare Other | Admitting: Physical Therapy

## 2018-09-05 ENCOUNTER — Ambulatory Visit (INDEPENDENT_AMBULATORY_CARE_PROVIDER_SITE_OTHER): Payer: Medicare Other | Admitting: Orthopaedic Surgery

## 2018-09-06 ENCOUNTER — Encounter (HOSPITAL_COMMUNITY): Payer: Medicare Other | Admitting: Physical Therapy

## 2018-09-07 ENCOUNTER — Encounter (HOSPITAL_COMMUNITY): Payer: Medicare Other | Admitting: Physical Therapy

## 2018-09-08 ENCOUNTER — Encounter: Payer: Self-pay | Admitting: Gastroenterology

## 2018-09-08 ENCOUNTER — Encounter (HOSPITAL_COMMUNITY): Payer: Medicare Other | Admitting: Physical Therapy

## 2018-09-09 ENCOUNTER — Encounter (HOSPITAL_COMMUNITY): Payer: Medicare Other | Admitting: Physical Therapy

## 2018-09-14 ENCOUNTER — Ambulatory Visit (INDEPENDENT_AMBULATORY_CARE_PROVIDER_SITE_OTHER): Payer: Medicare Other | Admitting: Orthopedic Surgery

## 2018-09-15 ENCOUNTER — Ambulatory Visit (INDEPENDENT_AMBULATORY_CARE_PROVIDER_SITE_OTHER): Payer: Medicare Other | Admitting: Orthopaedic Surgery

## 2018-09-22 ENCOUNTER — Ambulatory Visit (INDEPENDENT_AMBULATORY_CARE_PROVIDER_SITE_OTHER): Payer: Medicare Other | Admitting: Orthopaedic Surgery

## 2018-09-22 ENCOUNTER — Ambulatory Visit: Payer: Medicare Other | Admitting: "Endocrinology

## 2018-09-22 ENCOUNTER — Encounter (INDEPENDENT_AMBULATORY_CARE_PROVIDER_SITE_OTHER): Payer: Self-pay | Admitting: Orthopaedic Surgery

## 2018-09-22 DIAGNOSIS — M25562 Pain in left knee: Secondary | ICD-10-CM | POA: Diagnosis not present

## 2018-09-22 DIAGNOSIS — Z96652 Presence of left artificial knee joint: Secondary | ICD-10-CM

## 2018-09-22 NOTE — Progress Notes (Signed)
The patient comes in today for continued follow-up for her left knee.  She had a polyliner exchange in 2018.  Her original knee was done by someone else but due to her significant obesity became unstable and she needed a thicker liner.  She was doing well for a while but then had a fall.  We saw her for x-rays in November she obviously had some subluxation of her patella.  She cannot tolerate any surgery due to the multiple medical problems and so she has been in a facility and we recommended a brace and therapy.  She is only had a few therapy visits but she said the brace is helpful and there is no instability issues when she is in the brace.  The knee does pop in terms the patella popped out of place and she still uses a walker but overall she feels like she is getting around better.  Her knee exam today shows significant obesity around her knee with lymphedema.  It is hard to really get a good patella exam due to the obesity of her knee but I do think there is some subluxation issues.  The knee is otherwise clinically stable.  At this point I really would not offer anything else other than continued attempts at mobility and strength in her quads.  All question concerns were answered addressed.  Follow will be as needed.

## 2018-10-06 ENCOUNTER — Ambulatory Visit: Payer: Medicare Other | Admitting: Gastroenterology

## 2018-10-11 ENCOUNTER — Ambulatory Visit: Payer: Medicare Other | Admitting: Gastroenterology

## 2018-11-02 ENCOUNTER — Ambulatory Visit (INDEPENDENT_AMBULATORY_CARE_PROVIDER_SITE_OTHER): Payer: Medicare Other | Admitting: Orthopaedic Surgery

## 2018-11-15 ENCOUNTER — Ambulatory Visit: Payer: Medicare Other | Admitting: Gastroenterology

## 2018-11-16 ENCOUNTER — Ambulatory Visit (INDEPENDENT_AMBULATORY_CARE_PROVIDER_SITE_OTHER): Payer: Medicare Other | Admitting: Orthopaedic Surgery

## 2018-12-16 ENCOUNTER — Encounter (HOSPITAL_COMMUNITY): Payer: Medicare Other

## 2018-12-22 ENCOUNTER — Telehealth (HOSPITAL_COMMUNITY): Payer: Self-pay

## 2018-12-22 NOTE — Telephone Encounter (Signed)
Pt daughter called to say that her mother's cardiomems pillow isnt transmitting. Gave her number to call and trouble shoot.

## 2018-12-23 ENCOUNTER — Telehealth (HOSPITAL_COMMUNITY): Payer: Self-pay | Admitting: Cardiology

## 2018-12-23 NOTE — Telephone Encounter (Signed)
Leah with [facility] [phone]) called to report increased SOB, weight stable at 274, mild LE edema.   Patient has not been able to lay on CardioMems pillow- advised to contact [facility] directly for further advise   Please advise

## 2018-12-23 NOTE — Telephone Encounter (Signed)
She can take extra 20 mg torsemide x 2 days. Can add TED hose. Would be very helpful if they could get cardiomems to work. Thanks

## 2018-12-23 NOTE — Telephone Encounter (Signed)
Attempting to return call to [facility] @ [facility] -no answer, unable to leave message

## 2018-12-23 NOTE — Telephone Encounter (Signed)
Leah aware of medication increase Will be in touch with [employer] regarding pillow issues (800-number can be found on the side of the pillow to my understanding)  Nothing further needed at this time

## 2019-01-03 ENCOUNTER — Encounter: Payer: Self-pay | Admitting: Gastroenterology

## 2019-01-04 ENCOUNTER — Telehealth: Payer: Self-pay | Admitting: Orthopaedic Surgery

## 2019-01-04 NOTE — Telephone Encounter (Signed)
Ok to renew PT Rx?

## 2019-01-04 NOTE — Telephone Encounter (Signed)
Yes

## 2019-01-04 NOTE — Telephone Encounter (Signed)
I faxed rx for continued PT to [facility] at [phone].

## 2019-01-04 NOTE — Telephone Encounter (Signed)
Patient called needing RX for PT( has to print out Physical Therapy per facility) on knee/leg still not doing well wants to continue. Renew RX.  Fax to [facility]

## 2019-01-06 ENCOUNTER — Encounter (HOSPITAL_COMMUNITY): Payer: Medicare Other

## 2019-02-06 ENCOUNTER — Telehealth (HOSPITAL_COMMUNITY): Payer: Self-pay | Admitting: *Deleted

## 2019-02-06 NOTE — Telephone Encounter (Signed)
RN called to report pts 5lb weight gain over the weekend. The provider there gave patient an additional 40mg  of Torsemide yesterday evening her weight has not come down and no increase in urination. Per [name] Conn get stat bmet before making any other changes. RN at [facility] aware.

## 2019-02-06 NOTE — Telephone Encounter (Signed)
SNF called to report pt has a 5lb weight gain over the weekend and their provider requests we adjust diuretic. I called back to get more information ( current weight and symptoms). The receptionist transferred me multiple times but the phone continued to ring and ring no VM/no answer.

## 2019-02-07 ENCOUNTER — Telehealth (HOSPITAL_COMMUNITY): Payer: Self-pay | Admitting: *Deleted

## 2019-02-07 ENCOUNTER — Other Ambulatory Visit (HOSPITAL_COMMUNITY): Payer: Self-pay | Admitting: Cardiology

## 2019-02-07 MED ORDER — TORSEMIDE 20 MG PO TABS: ORAL_TABLET | ORAL | 3 refills | Status: DC

## 2019-02-07 NOTE — Telephone Encounter (Signed)
Shelle Iron RN called to give lab results from yesterday:  BUN  30 Cr 1.54 K 4.1  She states pt's wt was 280 lbs today, she was 282 lb on Sunday, her baseline usually runs 274-276 and she was that last week.  She states pt is taking Torsemide 80 mg daily, pt's breathing is at baseline, it is hard to evaluate for edema as pt has lymphedema and legs are always swollen.  Discussed all w/Amy Ninfa Meeker, NP she advises pt increase Torsemide to 80 mg in AM and 40 mg in PM and get repeat bmet in 7-10 days.  RN is aware and verbalizes understanding, med list updated.

## 2019-02-09 ENCOUNTER — Encounter (HOSPITAL_COMMUNITY): Payer: Medicare Other

## 2019-03-03 ENCOUNTER — Other Ambulatory Visit (HOSPITAL_COMMUNITY): Payer: Self-pay | Admitting: Cardiology

## 2019-03-15 ENCOUNTER — Telehealth (HOSPITAL_COMMUNITY): Payer: Self-pay

## 2019-03-15 NOTE — Telephone Encounter (Signed)
Yes - that would be fine

## 2019-03-15 NOTE — Telephone Encounter (Signed)
Received call from daughter asking for a prescription for a specific diet instructions while patient in rehab. Daughter claims she was gaining weight because she was not on the right diet.  Since, patient has lost 20lbs because of a change to diet.  Daughter wants a consistent heart healthy menu for patient that includes low carb, high protein and vegetables at a1200 calorie diet.   Is it ok to write script for 1200 calorie, heart healthy- low carb, high protein, and vegetables?

## 2019-03-16 NOTE — Telephone Encounter (Signed)
Rx faxed to [facility] for diet recommendations

## 2019-06-14 ENCOUNTER — Telehealth (HOSPITAL_COMMUNITY): Payer: Self-pay | Admitting: Specialist

## 2019-06-14 NOTE — Telephone Encounter (Signed)
Caregiver called waiting to schedule pt I explained that we are waiting for the Adm to call to confirm that [employer] will pay for the services. Once that is clear we will schedule.

## 2019-06-23 ENCOUNTER — Other Ambulatory Visit: Payer: Self-pay | Admitting: Gastroenterology

## 2019-06-23 DIAGNOSIS — K625 Hemorrhage of anus and rectum: Secondary | ICD-10-CM

## 2019-06-23 DIAGNOSIS — L290 Pruritus ani: Secondary | ICD-10-CM

## 2019-06-23 DIAGNOSIS — R194 Change in bowel habit: Secondary | ICD-10-CM

## 2019-06-23 DIAGNOSIS — K649 Unspecified hemorrhoids: Secondary | ICD-10-CM

## 2019-07-10 ENCOUNTER — Other Ambulatory Visit: Payer: Self-pay

## 2019-07-10 ENCOUNTER — Encounter: Payer: Self-pay | Admitting: Orthopaedic Surgery

## 2019-07-10 ENCOUNTER — Ambulatory Visit (INDEPENDENT_AMBULATORY_CARE_PROVIDER_SITE_OTHER): Payer: Medicare Other | Admitting: Orthopaedic Surgery

## 2019-07-10 ENCOUNTER — Ambulatory Visit (INDEPENDENT_AMBULATORY_CARE_PROVIDER_SITE_OTHER): Payer: Medicare Other

## 2019-07-10 DIAGNOSIS — M25512 Pain in left shoulder: Secondary | ICD-10-CM

## 2019-07-10 DIAGNOSIS — Z96612 Presence of left artificial shoulder joint: Secondary | ICD-10-CM | POA: Diagnosis not present

## 2019-07-10 NOTE — Progress Notes (Signed)
The patient is a 73 year old female who has a history of bilateral reverse shoulder arthroplasties done by Dr. Marlou Sa.  She has had a fall recently and feels like she may have injured her left shoulder.  She says it does not feel the same as her right shoulder.  She is someone who is morbidly obese and does put a lot of weight through her shoulders.  On exam today it is clinically well located but painful to her.  It does not feel unstable on my limited exam.  X-rays today are seen.  I did not have anything to compare it with other than x-rays done of her right shoulder last year.  I did not see any differences in comparing the x-rays.  Given the symptoms she is describing I would like to have her have a follow-up appointment with Dr. Marlou Sa this week so he can further evaluate her shoulder.

## 2019-07-12 ENCOUNTER — Encounter: Payer: Self-pay | Admitting: Orthopedic Surgery

## 2019-07-12 ENCOUNTER — Ambulatory Visit (INDEPENDENT_AMBULATORY_CARE_PROVIDER_SITE_OTHER): Payer: Medicare Other | Admitting: Orthopedic Surgery

## 2019-07-12 ENCOUNTER — Other Ambulatory Visit: Payer: Self-pay

## 2019-07-12 DIAGNOSIS — Z96612 Presence of left artificial shoulder joint: Secondary | ICD-10-CM | POA: Diagnosis not present

## 2019-07-12 DIAGNOSIS — M25512 Pain in left shoulder: Secondary | ICD-10-CM | POA: Diagnosis not present

## 2019-07-12 NOTE — Progress Notes (Signed)
Office Visit Note   Patient: [name]           Date of Birth: 01-26-1946           MRN: [id number] Visit Date: 07/12/2019 Requested by: Hilbert Corrigan, [place] [place],  [place] [address] PCP: Hilbert Corrigan, MD  Subjective: Chief Complaint  Patient presents with  . Left Shoulder - Pain    HPI: [name] is a patient with left reverse shoulder replacement placed 1018.  She was doing very well with that shoulder replacement.  She is doing well with the right one also which was placed about a year later.  However, 8 weeks ago she had a fall on that left shoulder and has had pain since that time.  8 days ago she was in physical therapy and she also had another fall which did not cause an impact on the left shoulder but someone grabbed her in an awkward way and her shoulders been very painful since that time.  She does take Norco.              ROS: All systems reviewed are negative as they relate to the chief complaint within the history of present illness.  Patient denies  fevers or chills.   Assessment & Plan: Visit Diagnoses:  1. Left shoulder pain, unspecified chronicity   2. Status post reverse total replacement of left shoulder     Plan: Impression is left shoulder pain with maintained passive range of motion and maintain deltoid function.  Reviewed the op note subscap was repaired she may have ruptured the subscap.  That could account for some of her loss of function.  I would favor CT scan for evaluation just to make sure none of these components have loosened from the falls.  See her back after that study.  Follow-Up Instructions: Return for after MRI.   Orders:  Orders Placed This Encounter  Procedures  . CT SHOULDER LEFT WO CONTRAST   No orders of the defined types were placed in this encounter.     Procedures: No procedures performed   Clinical Data: No additional findings.  Objective: Vital Signs: There were no vitals taken for  this visit.  Physical Exam:   Constitutional: Patient appears well-developed HEENT:  Head: Normocephalic Eyes:EOM are normal Neck: Normal range of motion Cardiovascular: Normal rate Pulmonary/chest: Effort normal Neurologic: Patient is alert Skin: Skin is warm Psychiatric: Patient has normal mood and affect    Ortho Exam: Ortho exam demonstrates full active and passive range of motion of the cervical spine.  She does have on that left-hand side some pain with passive range of motion but the shoulder is located.  She has good deltoid function.  She has not achieved forward flexion abduction above shoulder level in the clinic this morning and that something she had prior to these falls.  Previous radiographs are reviewed and showed no acute fracture or problem with the implants.  Specialty Comments:  No specialty comments available.  Imaging: No results found.   PMFS History: Patient Active Problem List   Diagnosis Date Noted  . Iron deficiency 08/06/2018  . History of total left knee replacement (TKR) 07/18/2018  . Rectal bleeding 07/05/2018  . Hemorrhoids 07/05/2018  . Anal itching 07/05/2018  . Rotator cuff arthropathy of right shoulder   . Shoulder arthritis 06/15/2017  . Status post revision of total knee replacement, left 04/29/2017  . Polyethylene liner wear following left total knee arthroplasty requiring  isolated polyethylene liner exchange ([place]) 03/02/2017  . 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 Unstable angina ([place]) 05/21/2016  . Chronic diastolic heart failure ([place]) 03/29/2016  . Normal coronary arteries 2009 01/14/2015  . Obesity-BMI 40 01/14/2015  . Restless leg 01/14/2015  . Chest pain 01/14/2015  . Back pain 01/14/2015  . Renal insufficiency 01/14/2015  . Dementia- mild memory issues 01/14/2015  . Occult blood positive stool 12/14/2014  . Anemia 12/14/2014  . Family history of colon cancer 12/14/2014  . Change in bowel  habits 12/14/2014  . GERD (gastroesophageal reflux disease) 12/14/2014  . Dyspnea 05/17/2012  . Lymphedema 05/17/2012  . Weight gain 05/17/2012  . HTN (hypertension) 05/17/2012   Past Medical History:  Diagnosis Date  . Anemia   . Anxiety   . Arthritis    "hands, feet" (03/03/2017)  . Brain lesion    2 types  . Cataract   . Cervical spondylosis with myelopathy   . Chest pain    Normal cardiac cath 5/09  . Chronic lower back pain   . CKD (chronic kidney disease), stage III    [name]  . Congestive heart failure (CHF) ([place])    has a Cardiomems implant   . Constipation   . Dementia ([place])   . Depression   . Dumping syndrome   . Dyspnea    with activity  . Edema   . Fatigue   . Fibromyalgia   . GERD (gastroesophageal reflux disease)   . Headache    "w/high CBG" (03/03/2017)  . History of echocardiogram    a. Echo 03/20/16 (done at [facility] in [place], [place]):  mild LVH, EF 62%, normal diastolic function, mild LAE, MAC, RVSP 25 mmHg  . Hyperlipidemia   . Hypertension   . Hypothyroidism   . Lumbar spondylosis   . Lymphedema    seeing specialist for this  . Migraine    "mostly stopped when I changed my diet" (03/03/2017)  . OSA on CPAP   . Pneumonia X 1  . PONV (postoperative nausea and vomiting)   . Restless leg syndrome   . Rheumatoid arthritis ([place])   . Stroke [facility])    TIAs "mini strokes"- unsure of last TIA - pt and daughter deny this  . Syncope   . Tremors of nervous system   . Type II diabetes mellitus (HCC)     Family History  Problem Relation Age of Onset  . Heart disease Mother   . Heart failure Mother   . Kidney disease Mother   . Lung cancer Father   . Colon cancer Brother   . Cancer Brother        malignant thymoma  . Heart disease Brother   . Tremor Maternal Grandmother 90  . Ovarian cancer Maternal Grandmother   . Uterine cancer Maternal Grandmother   . Irritable bowel syndrome Daughter   . Esophageal cancer Neg Hx   .  Stomach cancer Neg Hx   . Inflammatory bowel disease Neg Hx   . Liver disease Neg Hx   . Pancreatic cancer Neg Hx   . Rectal cancer Neg Hx     Past Surgical History:  Procedure Laterality Date  . APPENDECTOMY  1966  . BACK SURGERY    . CARDIAC CATHETERIZATION N/A 05/25/2016   Procedure: Right/Left Heart Cath and Coronary Angiography;  Surgeon: Larey Dresser, MD;  Location: [facility];  Service: Cardiovascular;  Laterality: N/A;  .  CATARACT EXTRACTION W/ INTRAOCULAR LENS  IMPLANT, BILATERAL Bilateral   . COLONOSCOPY    . CORONARY ANGIOGRAM  2009   Normal coronaries  . EXCISION/RELEASE BURSA HIP Bilateral   . I&D KNEE WITH POLY EXCHANGE Left 03/02/2017   Procedure: Left knee Revision of poly liner;  Surgeon: Mcarthur Rossetti, MD;  Location: [facility];  Service: Orthopedics;  Laterality: Left;  . JOINT REPLACEMENT    . KNEE ARTHROSCOPY Bilateral   . LAPAROSCOPIC CHOLECYSTECTOMY  2015  . LMF  2017   CardiMEMS HF implant for CHF (measures amount of fluid in the heart)  . LUMBAR DISC SURGERY    . LUMBAR LAMINECTOMY/DECOMPRESSION MICRODISCECTOMY Left 12/2005   L2-3 laminectomy and diskectomy/notes 01/06/2011  . POSTERIOR LUMBAR FUSION  04/2006   [name] 01/06/2011; "put cages in"  . REVERSE SHOULDER ARTHROPLASTY Left 06/15/2017   Procedure: REVERSE LEFT SHOULDER ARTHROPLASTY;  Surgeon: Meredith Pel, MD;  Location: [facility];  Service: Orthopedics;  Laterality: Left;  . REVERSE SHOULDER ARTHROPLASTY Right 02/01/2018  . REVERSE SHOULDER ARTHROPLASTY Right 02/01/2018   Procedure: RIGHT REVERSE SHOULDER ARTHROPLASTY;  Surgeon: Meredith Pel, MD;  Location: [facility];  Service: Orthopedics;  Laterality: Right;  . REVISION TOTAL KNEE ARTHROPLASTY Left 03/02/2017   poly liner/notes 03/02/2017  . RIGHT HEART CATH N/A 11/13/2016   Procedure: Right Heart Cath with Cardiomems;  Surgeon: Larey Dresser, MD;  Location: [facility];  Service: Cardiovascular;  Laterality: N/A;  .  SHOULDER ARTHROSCOPY WITH ROTATOR CUFF REPAIR Right   . SHOULDER OPEN ROTATOR CUFF REPAIR Left 01/2011   [name] 01/29/2011  . TOTAL ABDOMINAL HYSTERECTOMY    . TOTAL KNEE ARTHROPLASTY Left   . TOTAL KNEE ARTHROPLASTY Right 11/18/2012   Procedure: TOTAL KNEE ARTHROPLASTY;  Surgeon: Yvette Rack., MD;  Location: [facility];  Service: Orthopedics;  Laterality: Right;  . TUBAL LIGATION    . UPPER GASTROINTESTINAL ENDOSCOPY    . UPPER GI ENDOSCOPY     Social History   Occupational History  . Occupation: retired  Tobacco Use  . Smoking status: Never Smoker  . Smokeless tobacco: Never Used  Substance and Sexual Activity  . Alcohol use: No    Alcohol/week: 0.0 standard drinks  . Drug use: No  . Sexual activity: Not Currently

## 2019-07-19 ENCOUNTER — Ambulatory Visit
Admission: RE | Admit: 2019-07-19 | Discharge: 2019-07-19 | Disposition: A | Discharge status: Home / Self Care | Payer: Medicare Other | Source: Ambulatory Visit | Attending: Orthopedic Surgery | Admitting: Orthopedic Surgery

## 2019-07-19 ENCOUNTER — Other Ambulatory Visit: Payer: Self-pay

## 2019-07-19 DIAGNOSIS — M25512 Pain in left shoulder: Secondary | ICD-10-CM

## 2019-07-19 DIAGNOSIS — Z96612 Presence of left artificial shoulder joint: Secondary | ICD-10-CM

## 2019-07-19 NOTE — Progress Notes (Signed)
Scan shows no obvious reason for pain in the left shoulder which is a good finding.

## 2019-07-25 ENCOUNTER — Telehealth (HOSPITAL_COMMUNITY): Payer: Self-pay | Admitting: Physical Therapy

## 2019-07-25 NOTE — Telephone Encounter (Signed)
L/ m for Adm [name] - been calling for a month with no response, since we were unable to confirm that the bill will be paid by the nursing center this referral will be closed.

## 2019-08-02 ENCOUNTER — Other Ambulatory Visit: Payer: Self-pay

## 2019-08-02 ENCOUNTER — Encounter: Payer: Self-pay | Admitting: Orthopedic Surgery

## 2019-08-02 ENCOUNTER — Ambulatory Visit (INDEPENDENT_AMBULATORY_CARE_PROVIDER_SITE_OTHER): Payer: Medicare Other | Admitting: Orthopedic Surgery

## 2019-08-02 DIAGNOSIS — S42122A Displaced fracture of acromial process, left shoulder, initial encounter for closed fracture: Secondary | ICD-10-CM

## 2019-08-02 DIAGNOSIS — Z8781 Personal history of (healed) traumatic fracture: Secondary | ICD-10-CM | POA: Diagnosis not present

## 2019-08-04 ENCOUNTER — Encounter: Payer: Self-pay | Admitting: Orthopedic Surgery

## 2019-08-04 NOTE — Progress Notes (Signed)
Office Visit Note   Patient: [name]           Date of Birth: October 12, 1945           MRN: [id number] Visit Date: 08/02/2019 Requested by: Hilbert Corrigan, [address] [place],  [place] [address] PCP: Hilbert Corrigan, MD  Subjective: Chief Complaint  Patient presents with  . Follow-up    HPI: [name] is a 73 y.o. female who presents to the office complaining of left shoulder pain.  Patient returns to discuss CT scan results.  She has history of left reverse shoulder replacement.  She was doing very well according to her until she had a fall about 8 weeks ago and then weeks afterwards she had another fall where she was caught by her therapist in therapy.  She notes that her left shoulder pain has continued without any improvement since her last office visit.  She has worse pain when putting pressure on it.  She has been taking Tylenol as needed without much relief.  She cannot take NSAIDs due to her history of CKD.  She also notes that KT tape has been helping..                ROS:  All systems reviewed are negative as they relate to the chief complaint within the history of present illness.  Patient denies fevers or chills.  Assessment & Plan: Visit Diagnoses:  1. Closed displaced fracture of acromial process of left scapula, initial encounter   2. H/O nonunion of fracture     Plan: Patient is a 73 year old female who presents with history of few months of left shoulder pain.  She was previously doing well after her left reverse shoulder replacement.  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  This corresponds with patient's location of maximal tenderness and with her subjective complaints of pain being worse with active range of motion especially abduction.  Unfortunately, there are no great options for the treatment of this problem.  Patient will likely just have to live with this.  This fracture is likely  about 2 months old from that injury where she fell.  Her pain may improve somewhat as a fibrous union is established but we will not know exactly what type of improvement she will have permanently until about a year out from injury.  Fixation options for this fracture are relatively unlikely to succeed and could put the implant in jeopardy in terms of infection.  Patient will follow up with the office as needed.  Patient agreed with the plan.  Follow-Up Instructions: No follow-ups on file.   Orders:  No orders of the defined types were placed in this encounter.  No orders of the defined types were placed in this encounter.     Procedures: No procedures performed   Clinical Data: No additional findings.  Objective: Vital Signs: There were no vitals taken for this visit.  Physical Exam:  Constitutional: Patient appears well-developed HEENT:  Head: Normocephalic Eyes:EOM are normal Neck: Normal range of motion Cardiovascular: Normal rate Pulmonary/chest: Effort normal Neurologic: Patient is alert Skin: Skin is warm Psychiatric: Patient has normal mood and affect  Ortho Exam:  Left shoulder Exam Significant limitation of forward flexion and abduction due to patient's pain. Significant tenderness to palpation over the lateral acromion; this correlates with patient's localization of her pain complaint Subscapularis strength intact 5/5 grip strength, forearm pronation/supination, and  bicep strength Well-healed incision from previous shoulder arthroplasty surgery  Specialty Comments:  No specialty comments available.  Imaging: No results found.   PMFS History: Patient Active Problem List   Diagnosis Date Noted  . Iron deficiency 08/06/2018  . History of total left knee replacement (TKR) 07/18/2018  . Rectal bleeding 07/05/2018  . Hemorrhoids 07/05/2018  . Anal itching 07/05/2018  . Rotator cuff arthropathy of right shoulder   . Shoulder arthritis 06/15/2017  . Status  post revision of total knee replacement, left 04/29/2017  . Polyethylene liner wear following left total knee arthroplasty requiring isolated polyethylene liner exchange ([place]) 03/02/2017  . 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 Unstable angina ([place]) 05/21/2016  . Chronic diastolic heart failure ([place]) 03/29/2016  . Normal coronary arteries 2009 01/14/2015  . Obesity-BMI 40 01/14/2015  . Restless leg 01/14/2015  . Chest pain 01/14/2015  . Back pain 01/14/2015  . Renal insufficiency 01/14/2015  . Dementia- mild memory issues 01/14/2015  . Occult blood positive stool 12/14/2014  . Anemia 12/14/2014  . Family history of colon cancer 12/14/2014  . Change in bowel habits 12/14/2014  . GERD (gastroesophageal reflux disease) 12/14/2014  . Dyspnea 05/17/2012  . Lymphedema 05/17/2012  . Weight gain 05/17/2012  . HTN (hypertension) 05/17/2012   Past Medical History:  Diagnosis Date  . Anemia   . Anxiety   . Arthritis    "hands, feet" (03/03/2017)  . Brain lesion    2 types  . Cataract   . Cervical spondylosis with myelopathy   . Chest pain    Normal cardiac cath 5/09  . Chronic lower back pain   . CKD (chronic kidney disease), stage III    [name]  . Congestive heart failure (CHF) ([place])    has a Cardiomems implant   . Constipation   . Dementia ([place])   . Depression   . Dumping syndrome   . Dyspnea    with activity  . Edema   . Fatigue   . Fibromyalgia   . GERD (gastroesophageal reflux disease)   . Headache    "w/high CBG" (03/03/2017)  . History of echocardiogram    a. Echo 03/20/16 (done at [facility] in [place], [place]):  mild LVH, EF 75%, normal diastolic function, mild LAE, MAC, RVSP 25 mmHg  . Hyperlipidemia   . Hypertension   . Hypothyroidism   . Lumbar spondylosis   . Lymphedema    seeing specialist for this  . Migraine    "mostly stopped when I changed my diet" (03/03/2017)  . OSA on CPAP   . Pneumonia X 1  . PONV  (postoperative nausea and vomiting)   . Restless leg syndrome   . Rheumatoid arthritis ([place])   . Stroke [facility])    TIAs "mini strokes"- unsure of last TIA - pt and daughter deny this  . Syncope   . Tremors of nervous system   . Type II diabetes mellitus (HCC)     Family History  Problem Relation Age of Onset  . Heart disease Mother   . Heart failure Mother   . Kidney disease Mother   . Lung cancer Father   . Colon cancer Brother   . Cancer Brother        malignant thymoma  . Heart disease Brother   . Tremor Maternal Grandmother 90  . Ovarian cancer Maternal Grandmother   . Uterine cancer Maternal Grandmother   . Irritable bowel syndrome Daughter   .  Esophageal cancer Neg Hx   . Stomach cancer Neg Hx   . Inflammatory bowel disease Neg Hx   . Liver disease Neg Hx   . Pancreatic cancer Neg Hx   . Rectal cancer Neg Hx     Past Surgical History:  Procedure Laterality Date  . APPENDECTOMY  1966  . BACK SURGERY    . CARDIAC CATHETERIZATION N/A 05/25/2016   Procedure: Right/Left Heart Cath and Coronary Angiography;  Surgeon: Larey Dresser, MD;  Location: [facility];  Service: Cardiovascular;  Laterality: N/A;  . CATARACT EXTRACTION W/ INTRAOCULAR LENS  IMPLANT, BILATERAL Bilateral   . COLONOSCOPY    . CORONARY ANGIOGRAM  2009   Normal coronaries  . EXCISION/RELEASE BURSA HIP Bilateral   . I&D KNEE WITH POLY EXCHANGE Left 03/02/2017   Procedure: Left knee Revision of poly liner;  Surgeon: Mcarthur Rossetti, MD;  Location: [facility];  Service: Orthopedics;  Laterality: Left;  . JOINT REPLACEMENT    . KNEE ARTHROSCOPY Bilateral   . LAPAROSCOPIC CHOLECYSTECTOMY  2015  . LMF  2017   CardiMEMS HF implant for CHF (measures amount of fluid in the heart)  . LUMBAR DISC SURGERY    . LUMBAR LAMINECTOMY/DECOMPRESSION MICRODISCECTOMY Left 12/2005   L2-3 laminectomy and diskectomy/notes 01/06/2011  . POSTERIOR LUMBAR FUSION  04/2006   [name] 01/06/2011; "put cages in"  . REVERSE  SHOULDER ARTHROPLASTY Left 06/15/2017   Procedure: REVERSE LEFT SHOULDER ARTHROPLASTY;  Surgeon: Meredith Pel, MD;  Location: [facility];  Service: Orthopedics;  Laterality: Left;  . REVERSE SHOULDER ARTHROPLASTY Right 02/01/2018  . REVERSE SHOULDER ARTHROPLASTY Right 02/01/2018   Procedure: RIGHT REVERSE SHOULDER ARTHROPLASTY;  Surgeon: Meredith Pel, MD;  Location: [facility];  Service: Orthopedics;  Laterality: Right;  . REVISION TOTAL KNEE ARTHROPLASTY Left 03/02/2017   poly liner/notes 03/02/2017  . RIGHT HEART CATH N/A 11/13/2016   Procedure: Right Heart Cath with Cardiomems;  Surgeon: Larey Dresser, MD;  Location: [facility];  Service: Cardiovascular;  Laterality: N/A;  . SHOULDER ARTHROSCOPY WITH ROTATOR CUFF REPAIR Right   . SHOULDER OPEN ROTATOR CUFF REPAIR Left 01/2011   [name] 01/29/2011  . TOTAL ABDOMINAL HYSTERECTOMY    . TOTAL KNEE ARTHROPLASTY Left   . TOTAL KNEE ARTHROPLASTY Right 11/18/2012   Procedure: TOTAL KNEE ARTHROPLASTY;  Surgeon: Yvette Rack., MD;  Location: [facility];  Service: Orthopedics;  Laterality: Right;  . TUBAL LIGATION    . UPPER GASTROINTESTINAL ENDOSCOPY    . UPPER GI ENDOSCOPY     Social History   Occupational History  . Occupation: retired  Tobacco Use  . Smoking status: Never Smoker  . Smokeless tobacco: Never Used  Substance and Sexual Activity  . Alcohol use: No    Alcohol/week: 0.0 standard drinks  . Drug use: No  . Sexual activity: Not Currently

## 2019-08-05 ENCOUNTER — Encounter: Payer: Self-pay | Admitting: Orthopedic Surgery

## 2019-08-15 ENCOUNTER — Telehealth: Payer: Self-pay

## 2019-08-15 NOTE — Telephone Encounter (Signed)
Worked patient in on Dr [employer] schedule Acute onset of pain after reaching behind Hx of RSA Unable to WTB on UE

## 2019-08-15 NOTE — Telephone Encounter (Signed)
Vicente Males called stating that patient needs to be seen for her left shoulder.  Stated that patient is not able to put any pressure on left shoulder and patient is in a lot of pain.  Vicente Males is aware that Dr. Marlou Sa is out of the office.  Cb# is [phone].  Please advise.  Thank you.

## 2019-08-16 ENCOUNTER — Ambulatory Visit: Payer: Medicare Other | Admitting: Family Medicine

## 2019-08-16 ENCOUNTER — Ambulatory Visit (INDEPENDENT_AMBULATORY_CARE_PROVIDER_SITE_OTHER): Payer: Medicare Other

## 2019-08-16 ENCOUNTER — Other Ambulatory Visit: Payer: Self-pay

## 2019-08-16 ENCOUNTER — Ambulatory Visit (INDEPENDENT_AMBULATORY_CARE_PROVIDER_SITE_OTHER): Payer: Medicare Other | Admitting: Orthopedic Surgery

## 2019-08-16 ENCOUNTER — Encounter: Payer: Self-pay | Admitting: Orthopedic Surgery

## 2019-08-16 DIAGNOSIS — S42122A Displaced fracture of acromial process, left shoulder, initial encounter for closed fracture: Secondary | ICD-10-CM

## 2019-08-16 DIAGNOSIS — Z96612 Presence of left artificial shoulder joint: Secondary | ICD-10-CM | POA: Diagnosis not present

## 2019-08-16 DIAGNOSIS — M25512 Pain in left shoulder: Secondary | ICD-10-CM

## 2019-08-17 ENCOUNTER — Encounter: Payer: Self-pay | Admitting: Orthopedic Surgery

## 2019-08-17 NOTE — Progress Notes (Signed)
Office Visit Note   Patient: [name]           Date of Birth: 11-Jan-1946           MRN: [id number] Visit Date: 08/16/2019 Requested by: Hilbert Corrigan, [address],  [place] [address] PCP: Hilbert Corrigan, MD  Subjective: No chief complaint on file.   HPI: Laure is a patient with left shoulder pain.  Had acute onset of pain 2 days ago.  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  Shoulder was relatively asymptomatic at that time.  She has been on Norco.  Daughter is with her today and really has no ability to care for her effectively unless she can use both of her arms.  Patient does have increased body mass index and is a difficult transfer from wheelchair to bed.  She is unable to weight-bear on the left upper extremity.  The shoulder itself has been stable.              ROS: All systems reviewed are negative as they relate to the chief complaint within the history of present illness.  Patient denies  fevers or chills.   Assessment & Plan: Visit Diagnoses:  1. Left shoulder pain, unspecified chronicity   2. Presence of left artificial shoulder joint   3. Closed displaced fracture of acromial process of left scapula, initial encounter     Plan: Impression is acromial fracture left shoulder which is likely acute on chronic.  This is a difficult problem for Dalicia.  I do not think in general she has great potential to heal this fracture.  She would have to be immobilized in a sling for at least 6 to 8 weeks for this to heal.  I am not sure that there is enough bone for fixation for this particular problem.  I think that operative fixation if successful would give her more functional shoulder but the rates of success in this patient with the demands she will put on the shoulder are 20% or less.  I think it would be helpful to get a CT scan to really assess the size of the fragment.   That would require three-dimensional reconstructions.  We can decide then for or against surgical fixation at that time.  Daughter is getting to the point where she really cannot care for her mother and her current situation which is with essentially only one functional extremity.  I do think that she could benefit from home health as well as therapy to maintain the passive range of motion of her shoulder.  Follow-Up Instructions: No follow-ups on file.   Orders:  Orders Placed This Encounter  Procedures  . XR Shoulder Left  . CT SHOULDER LEFT WO CONTRAST   No orders of the defined types were placed in this encounter.     Procedures: No procedures performed   Clinical Data: No additional findings.  Objective: Vital Signs: There were no vitals taken for this visit.  Physical Exam:   Constitutional: Patient appears well-developed HEENT:  Head: Normocephalic Eyes:EOM are normal Neck: Normal range of motion Cardiovascular: Normal rate Pulmonary/chest: Effort normal Neurologic: Patient is alert Skin: Skin is warm Psychiatric: Patient has normal mood and affect    Ortho Exam: Ortho exam demonstrates tenderness to palpation around the left shoulder region.  Deltoid does fire.  Shoulder is located.  Motor sensory function to the hand is intact.  Specialty Comments:  No specialty comments available.  Imaging: No results found.   PMFS History: Patient Active Problem List   Diagnosis Date Noted  . Iron deficiency 08/06/2018  . History of total left knee replacement (TKR) 07/18/2018  . Rectal bleeding 07/05/2018  . Hemorrhoids 07/05/2018  . Anal itching 07/05/2018  . Rotator cuff arthropathy of right shoulder   . Shoulder arthritis 06/15/2017  . Status post revision of total knee replacement, left 04/29/2017  . Polyethylene liner wear following left total knee arthroplasty requiring isolated polyethylene liner exchange ([place]) 03/02/2017  . 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 Unstable angina ([place]) 05/21/2016  . Chronic diastolic heart failure ([place]) 03/29/2016  . Normal coronary arteries 2009 01/14/2015  . Obesity-BMI 40 01/14/2015  . Restless leg 01/14/2015  . Chest pain 01/14/2015  . Back pain 01/14/2015  . Renal insufficiency 01/14/2015  . Dementia- mild memory issues 01/14/2015  . Occult blood positive stool 12/14/2014  . Anemia 12/14/2014  . Family history of colon cancer 12/14/2014  . Change in bowel habits 12/14/2014  . GERD (gastroesophageal reflux disease) 12/14/2014  . Dyspnea 05/17/2012  . Lymphedema 05/17/2012  . Weight gain 05/17/2012  . HTN (hypertension) 05/17/2012   Past Medical History:  Diagnosis Date  . Anemia   . Anxiety   . Arthritis    "hands, feet" (03/03/2017)  . Brain lesion    2 types  . Cataract   . Cervical spondylosis with myelopathy   . Chest pain    Normal cardiac cath 5/09  . Chronic lower back pain   . CKD (chronic kidney disease), stage III    [name]  . Congestive heart failure (CHF) ([place])    has a Cardiomems implant   . Constipation   . Dementia ([place])   . Depression   . Dumping syndrome   . Dyspnea    with activity  . Edema   . Fatigue   . Fibromyalgia   . GERD (gastroesophageal reflux disease)   . Headache    "w/high CBG" (03/03/2017)  . History of echocardiogram    a. Echo 03/20/16 (done at [facility] in [place], [place]):  mild LVH, EF 12%, normal diastolic function, mild LAE, MAC, RVSP 25 mmHg  . Hyperlipidemia   . Hypertension   . Hypothyroidism   . Lumbar spondylosis   . Lymphedema    seeing specialist for this  . Migraine    "mostly stopped when I changed my diet" (03/03/2017)  . OSA on CPAP   . Pneumonia X 1  . PONV (postoperative nausea and vomiting)   . Restless leg syndrome   . Rheumatoid arthritis ([place])   . Stroke [facility])    TIAs "mini strokes"- unsure of last TIA - pt and daughter deny this  . Syncope   . Tremors of nervous  system   . Type II diabetes mellitus (H[facility])     Family History  Problem Relation Age of Onset  . Heart disease Mother   . Heart failure Mother   . Kidney disease Mother   . Lung cancer Father   . Colon cancer Brother   . Cancer Brother        malignant thymoma  . Heart disease Brother   . Tremor Maternal Grandmother 90  . Ovarian cancer Maternal Grandmother   . Uterine cancer Maternal Grandmother   . Irritable bowel syndrome Daughter   . Esophageal cancer Neg Hx   .  Stomach cancer Neg Hx   . Inflammatory bowel disease Neg Hx   . Liver disease Neg Hx   . Pancreatic cancer Neg Hx   . Rectal cancer Neg Hx     Past Surgical History:  Procedure Laterality Date  . APPENDECTOMY  1966  . BACK SURGERY    . CARDIAC CATHETERIZATION N/A 05/25/2016   Procedure: Right/Left Heart Cath and Coronary Angiography;  Surgeon: Larey Dresser, MD;  Location: [facility] [facility];  Service: Cardiovascular;  Laterality: N/A;  . CATARACT EXTRACTION W/ INTRAOCULAR LENS  IMPLANT, BILATERAL Bilateral   . COLONOSCOPY    . CORONARY ANGIOGRAM  2009   Normal coronaries  . EXCISION/RELEASE BURSA HIP Bilateral   . I & D KNEE WITH POLY EXCHANGE Left 03/02/2017   Procedure: Left knee Revision of poly liner;  Surgeon: Mcarthur Rossetti, MD;  Location: [place] [facility];  Service: Orthopedics;  Laterality: Left;  . JOINT REPLACEMENT    . KNEE ARTHROSCOPY Bilateral   . LAPAROSCOPIC CHOLECYSTECTOMY  2015  . LMF  2017   CardiMEMS HF implant for CHF (measures amount of fluid in the heart)  . LUMBAR DISC SURGERY    . LUMBAR LAMINECTOMY/DECOMPRESSION MICRODISCECTOMY Left 12/2005   L2-3 laminectomy and diskectomy/notes 01/06/2011  . POSTERIOR LUMBAR FUSION  04/2006   [name] 01/06/2011; "put cages in"  . REVERSE SHOULDER ARTHROPLASTY Left 06/15/2017   Procedure: REVERSE LEFT SHOULDER ARTHROPLASTY;  Surgeon: Meredith Pel, MD;  Location: [employer];  Service: Orthopedics;  Laterality: Left;  . REVERSE SHOULDER ARTHROPLASTY  Right 02/01/2018  . REVERSE SHOULDER ARTHROPLASTY Right 02/01/2018   Procedure: RIGHT REVERSE SHOULDER ARTHROPLASTY;  Surgeon: Meredith Pel, MD;  Location: [place];  Service: Orthopedics;  Laterality: Right;  . REVISION TOTAL KNEE ARTHROPLASTY Left 03/02/2017   poly liner/notes 03/02/2017  . RIGHT HEART CATH N/A 11/13/2016   Procedure: Right Heart Cath with Cardiomems;  Surgeon: Larey Dresser, MD;  Location: [place] [facility];  Service: Cardiovascular;  Laterality: N/A;  . SHOULDER ARTHROSCOPY WITH ROTATOR CUFF REPAIR Right   . SHOULDER OPEN ROTATOR CUFF REPAIR Left 01/2011   [name] 01/29/2011  . TOTAL ABDOMINAL HYSTERECTOMY    . TOTAL KNEE ARTHROPLASTY Left   . TOTAL KNEE ARTHROPLASTY Right 11/18/2012   Procedure: TOTAL KNEE ARTHROPLASTY;  Surgeon: Yvette Rack., MD;  Location: [facility];  Service: Orthopedics;  Laterality: Right;  . TUBAL LIGATION    . UPPER GASTROINTESTINAL ENDOSCOPY    . UPPER GI ENDOSCOPY     Social History   Occupational History  . Occupation: retired  Tobacco Use  . Smoking status: Never Smoker  . Smokeless tobacco: Never Used  Substance and Sexual Activity  . Alcohol use: No    Alcohol/week: 0.0 standard drinks  . Drug use: No  . Sexual activity: Not Currently

## 2019-08-21 ENCOUNTER — Other Ambulatory Visit: Payer: Self-pay | Admitting: Orthopedic Surgery

## 2019-08-22 ENCOUNTER — Encounter (HOSPITAL_COMMUNITY): Payer: Self-pay | Admitting: Emergency Medicine

## 2019-08-22 ENCOUNTER — Emergency Department (HOSPITAL_COMMUNITY)
Admission: EM | Admit: 2019-08-22 | Discharge: 2019-08-22 | Disposition: A | Discharge status: Home / Self Care | Payer: Medicare Other | Attending: Emergency Medicine | Admitting: Emergency Medicine

## 2019-08-22 ENCOUNTER — Telehealth: Payer: Self-pay | Admitting: Orthopedic Surgery

## 2019-08-22 DIAGNOSIS — E039 Hypothyroidism, unspecified: Secondary | ICD-10-CM | POA: Insufficient documentation

## 2019-08-22 DIAGNOSIS — M25512 Pain in left shoulder: Secondary | ICD-10-CM | POA: Diagnosis present

## 2019-08-22 DIAGNOSIS — I5032 Chronic diastolic (congestive) heart failure: Secondary | ICD-10-CM | POA: Insufficient documentation

## 2019-08-22 DIAGNOSIS — Z7982 Long term (current) use of aspirin: Secondary | ICD-10-CM | POA: Insufficient documentation

## 2019-08-22 DIAGNOSIS — E1122 Type 2 diabetes mellitus with diabetic chronic kidney disease: Secondary | ICD-10-CM | POA: Insufficient documentation

## 2019-08-22 DIAGNOSIS — Z96651 Presence of right artificial knee joint: Secondary | ICD-10-CM | POA: Insufficient documentation

## 2019-08-22 DIAGNOSIS — Z79899 Other long term (current) drug therapy: Secondary | ICD-10-CM | POA: Diagnosis not present

## 2019-08-22 DIAGNOSIS — N183 Chronic kidney disease, stage 3 unspecified: Secondary | ICD-10-CM | POA: Diagnosis not present

## 2019-08-22 DIAGNOSIS — I130 Hypertensive heart and chronic kidney disease with heart failure and stage 1 through stage 4 chronic kidney disease, or unspecified chronic kidney disease: Secondary | ICD-10-CM | POA: Insufficient documentation

## 2019-08-22 DIAGNOSIS — F0390 Unspecified dementia without behavioral disturbance: Secondary | ICD-10-CM | POA: Insufficient documentation

## 2019-08-22 DIAGNOSIS — Z7984 Long term (current) use of oral hypoglycemic drugs: Secondary | ICD-10-CM | POA: Diagnosis not present

## 2019-08-22 DIAGNOSIS — G8929 Other chronic pain: Secondary | ICD-10-CM | POA: Insufficient documentation

## 2019-08-22 MED ORDER — HYDROCODONE-ACETAMINOPHEN 7.5-325 MG PO TABS
1.0000 | ORAL_TABLET | Freq: Once | ORAL | Status: AC
  Administered 2019-08-22: 1 via ORAL
  Filled 2019-08-22: qty 1

## 2019-08-22 NOTE — ED Provider Notes (Signed)
[facility] EMERGENCY DEPARTMENT Provider Note   CSN: [id number] Arrival date & time: 08/22/19  1436     History No chief complaint on file.   [name] is a 73 y.o. female.  Patient with history of falls, nonhealing fracture in her left shoulder, mild dementia -- presents to the emergency department today on advisement of her orthopedist due to concerns over care at home.  Patient typically is with her daughter who has chronic back problems and her own chronic medical problems.  Daughter was seen this morning with concern for stroke.  She was discharged home but can no longer provide appropriate care for patient.  She has been trying to find a nursing facility to go stay at, however has not been successful to this point.  Her left shoulder is at baseline.  She takes Tylenol and Norco for pain.  She is pending a 3D image of her shoulder to determine if operative management is likely to be successful, however she has been quoted that she would only have a 20% chance to regain use of her left arm above her elbow.  Patient denies any other concerns at this time.        Past Medical History:  Diagnosis Date  . Anemia   . Anxiety   . Arthritis    "hands, feet" (03/03/2017)  . Brain lesion    2 types  . Cataract   . Cervical spondylosis with myelopathy   . Chest pain    Normal cardiac cath 5/09  . Chronic lower back pain   . CKD (chronic kidney disease), stage III    [name]  . Congestive heart failure (CHF) ([place])    has a Cardiomems implant   . Constipation   . Dementia ([place])   . Depression   . Dumping syndrome   . Dyspnea    with activity  . Edema   . Fatigue   . Fibromyalgia   . GERD (gastroesophageal reflux disease)   . Headache    "w/high CBG" (03/03/2017)  . History of echocardiogram    a. Echo 03/20/16 (done at [facility] in [place], [place]):  mild LVH, EF 97%, normal diastolic function, mild LAE, MAC, RVSP 25 mmHg  .  Hyperlipidemia   . Hypertension   . Hypothyroidism   . Lumbar spondylosis   . Lymphedema    seeing specialist for this  . Migraine    "mostly stopped when I changed my diet" (03/03/2017)  . OSA on CPAP   . Pneumonia X 1  . PONV (postoperative nausea and vomiting)   . Restless leg syndrome   . Rheumatoid arthritis ([place])   . Stroke [facility])    TIAs "mini strokes"- unsure of last TIA - pt and daughter deny this  . Syncope   . Tremors of nervous system   . Type II diabetes mellitus [facility])     Patient Active Problem List   Diagnosis Date Noted  . Iron deficiency 08/06/2018  . History of total left knee replacement (TKR) 07/18/2018  . Rectal bleeding 07/05/2018  . Hemorrhoids 07/05/2018  . Anal itching 07/05/2018  . Rotator cuff arthropathy of right shoulder   . Shoulder arthritis 06/15/2017  . Status post revision of total knee replacement, left 04/29/2017  . Polyethylene liner wear following left total knee arthroplasty requiring isolated polyethylene liner exchange ([place]) 03/02/2017  . 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 Unstable angina ([place]) 05/21/2016  .  Chronic diastolic heart failure ([place]) 03/29/2016  . Normal coronary arteries 2009 01/14/2015  . Obesity-BMI 40 01/14/2015  . Restless leg 01/14/2015  . Chest pain 01/14/2015  . Back pain 01/14/2015  . Renal insufficiency 01/14/2015  . Dementia- mild memory issues 01/14/2015  . Occult blood positive stool 12/14/2014  . Anemia 12/14/2014  . Family history of colon cancer 12/14/2014  . Change in bowel habits 12/14/2014  . GERD (gastroesophageal reflux disease) 12/14/2014  . Dyspnea 05/17/2012  . Lymphedema 05/17/2012  . Weight gain 05/17/2012  . HTN (hypertension) 05/17/2012    Past Surgical History:  Procedure Laterality Date  . APPENDECTOMY  1966  . BACK SURGERY    . CARDIAC CATHETERIZATION N/A 05/25/2016   Procedure: Right/Left Heart Cath and Coronary Angiography;  Surgeon: Larey Dresser, MD;  Location: [place] [facility];  Service: Cardiovascular;  Laterality: N/A;  . CATARACT EXTRACTION W/ INTRAOCULAR LENS  IMPLANT, BILATERAL Bilateral   . COLONOSCOPY    . CORONARY ANGIOGRAM  2009   Normal coronaries  . EXCISION/RELEASE BURSA HIP Bilateral   . I & D KNEE WITH POLY EXCHANGE Left 03/02/2017   Procedure: Left knee Revision of poly liner;  Surgeon: Mcarthur Rossetti, MD;  Location: ;  Service: Orthopedics;  Laterality: Left;  . JOINT REPLACEMENT    . KNEE ARTHROSCOPY Bilateral   . LAPAROSCOPIC CHOLECYSTECTOMY  2015  . LMF  2017   CardiMEMS HF implant for CHF (measures amount of fluid in the heart)  . LUMBAR DISC SURGERY    . LUMBAR LAMINECTOMY/DECOMPRESSION MICRODISCECTOMY Left 12/2005   L2-3 laminectomy and diskectomy/notes 01/06/2011  . POSTERIOR LUMBAR FUSION  04/2006   [name] 01/06/2011; "put cages in"  . REVERSE SHOULDER ARTHROPLASTY Left 06/15/2017   Procedure: REVERSE LEFT SHOULDER ARTHROPLASTY;  Surgeon: Meredith Pel, MD;  Location: [facility];  Service: Orthopedics;  Laterality: Left;  . REVERSE SHOULDER ARTHROPLASTY Right 02/01/2018  . REVERSE SHOULDER ARTHROPLASTY Right 02/01/2018   Procedure: RIGHT REVERSE SHOULDER ARTHROPLASTY;  Surgeon: Meredith Pel, MD;  Location: [place];  Service: Orthopedics;  Laterality: Right;  . REVISION TOTAL KNEE ARTHROPLASTY Left 03/02/2017   poly liner/notes 03/02/2017  . RIGHT HEART CATH N/A 11/13/2016   Procedure: Right Heart Cath with Cardiomems;  Surgeon: Larey Dresser, MD;  Location: [facility] [facility];  Service: Cardiovascular;  Laterality: N/A;  . SHOULDER ARTHROSCOPY WITH ROTATOR CUFF REPAIR Right   . SHOULDER OPEN ROTATOR CUFF REPAIR Left 01/2011   [name] 01/29/2011  . TOTAL ABDOMINAL HYSTERECTOMY    . TOTAL KNEE ARTHROPLASTY Left   . TOTAL KNEE ARTHROPLASTY Right 11/18/2012   Procedure: TOTAL KNEE ARTHROPLASTY;  Surgeon: Yvette Rack., MD;  Location: [facility];  Service: Orthopedics;  Laterality:  Right;  . TUBAL LIGATION    . UPPER GASTROINTESTINAL ENDOSCOPY    . UPPER GI ENDOSCOPY       OB History   No obstetric history on file.     Family History  Problem Relation Age of Onset  . Heart disease Mother   . Heart failure Mother   . Kidney disease Mother   . Lung cancer Father   . Colon cancer Brother   . Cancer Brother        malignant thymoma  . Heart disease Brother   . Tremor Maternal Grandmother 90  . Ovarian cancer Maternal Grandmother   . Uterine cancer Maternal Grandmother   . Irritable bowel syndrome Daughter   . Esophageal cancer Neg  Hx   . Stomach cancer Neg Hx   . Inflammatory bowel disease Neg Hx   . Liver disease Neg Hx   . Pancreatic cancer Neg Hx   . Rectal cancer Neg Hx     Social History   Tobacco Use  . Smoking status: Never Smoker  . Smokeless tobacco: Never Used  Substance Use Topics  . Alcohol use: No    Alcohol/week: 0.0 standard drinks  . Drug use: No    Home Medications Prior to Admission medications   Medication Sig Start Date End Date Taking? Authorizing Provider  acetaminophen (TYLENOL) 650 MG CR tablet Take 650 mg by mouth every 4 (four) hours as needed for pain.    [provider]  amLODipine (NORVASC) 10 MG tablet Take 1 tablet (10 mg total) by mouth daily. 10/20/16   Clegg, Amy D, NP  antiseptic oral rinse (BIOTENE) LIQD 10 mLs by Mouth Rinse route 4 (four) times daily. (0900, 1000, 1200 & 2100)    [provider]  aspirin EC 81 MG tablet Take 1 tablet (81 mg total) by mouth daily. 11/29/17   Larey Dresser, MD  baclofen (LIORESAL) 10 MG tablet Take 10 mg by mouth 3 (three) times daily. (0900, 1400, & 2100)    [provider]  budesonide (PULMICORT) 0.5 MG/2ML nebulizer solution Take 0.5 mg by nebulization 2 (two) times daily. (1000 & 2200)    [provider]  Cholecalciferol (VITAMIN D3) 1000 units CAPS Take 1,000 Units by mouth daily. (0900)    [provider]  cloNIDine  (CATAPRES) 0.1 MG tablet Take 0.1 mg by mouth 2 (two) times daily. (0900 & 2100)    [provider]  Cyanocobalamin (VITAMIN B-12 IJ) Inject 1,000 mcg as directed every 30 (thirty) days. Weekly for 4 weeks, has 1 more dose this week (week of 7/2) then will switch to once a month     [provider]  cyclobenzaprine (FLEXERIL) 5 MG tablet Take 10 mg by mouth 2 (two) times daily. (0800 & 2100)    [provider]  donepezil (ARICEPT) 5 MG tablet Take 5 mg by mouth daily at 10 pm. (2100)    [provider]  DULoxetine (CYMBALTA) 30 MG capsule Take 30 mg by mouth daily. (0900)    [provider]  ferrous gluconate (FERGON) 324 MG tablet Take 1 tablet (324 mg total) by mouth 2 (two) times daily with a meal. 06/30/18 08/11/18  Mansouraty, Telford Nab., MD  gabapentin (NEURONTIN) 300 MG capsule Take 400 mg by mouth 2 (two) times daily. (0900 & 2100)    [provider]  HYDROcodone-acetaminophen (NORCO) 7.5-325 MG tablet Take 1 tablet by mouth every 6 (six) hours as needed.  05/09/18   [provider]  ipratropium-albuterol (DUONEB) 0.5-2.5 (3) MG/3ML SOLN Take 3 mLs by nebulization every 6 (six) hours as needed (FOR WHEEZING/SHORTNESS OF BREATH).    [provider]  lamoTRIgine (LAMICTAL) 100 MG tablet Take 100 mg by mouth 2 (two) times daily. (0900 & 2100) 11/03/14   [provider]  levothyroxine (SYNTHROID, LEVOTHROID) 88 MCG tablet Take 88 mcg by mouth daily before breakfast. (0900)    [provider]  Melatonin 3 MG CAPS Take 3 mg by mouth at bedtime. (2100)    [provider]  omeprazole (PRILOSEC) 20 MG capsule Take 20 mg by mouth 2 (two) times daily before a meal. (0900 & 1700)    [provider]  pravastatin (PRAVACHOL) 40 MG  tablet Take 40 mg by mouth daily. (2200)    [provider]  rOPINIRole (REQUIP) 2 MG tablet Take 3 mg by mouth 4 (four) times daily. After breakfast, lunch, dinner  and at bedtime    [provider]  sennosides-docusate sodium (SENOKOT-S) 8.6-50 MG tablet Take 1 tablet by mouth 2 (two) times daily as needed. (0900 & 2100)    [provider]  torsemide (DEMADEX) 20 MG tablet Take 4 tablets (80 mg total) by mouth every morning AND 2 tablets (40 mg total) every evening. 02/07/19   Clegg, Amy D, NP  traZODone (DESYREL) 50 MG tablet Take 50 mg by mouth at bedtime. (2100) 05/17/17   [provider]    Allergies    Patient has no known allergies.  Review of Systems   Review of Systems  Constitutional: Negative for fever.  HENT: Negative for rhinorrhea and sore throat.   Eyes: Negative for redness.  Respiratory: Negative for cough.   Cardiovascular: Negative for chest pain.  Gastrointestinal: Negative for abdominal pain, diarrhea, nausea and vomiting.  Genitourinary: Negative for dysuria.  Musculoskeletal: Positive for arthralgias. Negative for myalgias.  Skin: Negative for rash.  Neurological: Positive for weakness (Left upper extremity, chronic). Negative for headaches.    Physical Exam Updated Vital Signs BP (!) 165/70 (BP Location: Right Arm)   Pulse (!) 59   Temp 98.1 F (36.7 C) (Oral)   Resp 18   SpO2 100%   Physical Exam Vitals and nursing note reviewed.  Constitutional:      Appearance: She is well-developed.  HENT:     Head: Normocephalic and atraumatic.  Eyes:     General:        Right eye: No discharge.        Left eye: No discharge.     Conjunctiva/sclera: Conjunctivae normal.  Cardiovascular:     Rate and Rhythm: Normal rate.  Pulmonary:     Effort: Pulmonary effort is normal.  Abdominal:     Palpations: Abdomen is soft.     Tenderness: There is no abdominal tenderness.  Musculoskeletal:     Left shoulder: Tenderness (Generally) present. No swelling or deformity. Decreased range of motion. Normal pulse.     Cervical back: Normal, normal range of motion and neck supple.  Skin:    General: Skin  is warm and dry.  Neurological:     Mental Status: She is alert.     Deep Tendon Reflexes: Abnormal reflex:      ED Results / Procedures / Treatments   Labs (all labs ordered are listed, but only abnormal results are displayed) Labs Reviewed - No data to display  EKG None  Radiology No results found.  Procedures Procedures (including critical care time)  Medications Ordered in ED Medications  HYDROcodone-acetaminophen (NORCO) 7.5-325 MG per tablet 1 tablet (1 tablet Oral Given 08/22/19 1618)    ED Course  I have reviewed the triage vital signs and the nursing notes.  Pertinent labs & imaging results that were available during my care of the patient were reviewed by me and considered in my medical decision making (see chart for details).  Patient seen and examined.  Norco ordered for pain.  I have spoken with case management and social worker who are willing to see the patient.  Pending advice.  PT eval ordered.  Vital signs reviewed and are as follows: BP (!) 165/70 (BP Location: Right Arm)   Pulse (!) 59   Temp 98.1 F (36.7 C) (  Oral)   Resp 18   SpO2 100%   PT eval completed and case manager has seen patient.  Orders placed for home health evaluation.  Patient will not be able to be placed into a nursing facility from the ED today.  She will need to continue to follow-up with her primary care doctor regarding her long-term care concerns and needs.     MDM Rules/Calculators/A&P                      Patient here with chronic left shoulder pain and disability from previous injury.  From a shoulder injury standpoint, she is being closely followed by orthopedics and she does not have any need for acute intervention tonight in the emergency department.  Her left upper extremity is neurovascularly intact.  She does have concerns about adequate care at home.  She ultimately would like to be placed in a nursing facility.  Unable to do this from the emergency department in the  setting of the current Covid pandemic.  We have placed orders for home health evaluation to provide as much assistance as possible for patient and her family.  Plan is for discharged home and patient is aware.    Final Clinical Impression(s) / ED Diagnoses Final diagnoses:  Chronic left shoulder pain    Rx / DC Orders ED Discharge Orders    None       Carlisle Cater, Hershal Coria 08/22/19 1825    Quintella Reichert, MD 08/22/19 2020

## 2019-08-22 NOTE — Telephone Encounter (Signed)
Patient called. Says her daughter had a stroke this morning. Would like to know if she is able to get home health because her daughter was taking care of her and now will not be able to. Her call back number is [phone]

## 2019-08-22 NOTE — Telephone Encounter (Signed)
IC s/w patient and she would like to know how she could get admitted into a nursing facility. I advised her we were unable to admit her through our facility.  I advised her she could try to go through the ER.   She said that her daughter cared for her since she has dementia, shoulder fracture and since her daughter had a stroke this morning. She will let us know if we need to help her with anything else.

## 2019-08-22 NOTE — Evaluation (Signed)
Physical Therapy Evaluation Patient Details Name: [name] MRN: [id number] DOB: [birth year] Today's Date: 08/22/2019   History of Present Illness  Pt is 73 yo female who presented to the ED because unable to care for self at home and orthopedist recommended coming to ED.  Patient with history of mild dementia, falls, with recent fall resulting in fracture in her left shoulder.   Pt currently followed by orthopedics for further evaluation of shoulder as outpatient.  Pt was staying with her daughter but her daughter is no longer able to take care of her at home, so presented to ED.  Clinical Impression  Pt admitted with above diagnosis. Pt had been in a nursing home for about 2 years, but events related to [place] led to her being at home with daughter.  Pt has been seeking NH placement again.  Daughter had been assisting at home but is no longer able to.  Pt requiring mod A for transfers and min A to ambulate 10' with RW.  Shoulder injury has increased difficulty with ADLs and walking.  Pt currently with functional limitations due to the deficits listed below (see PT Problem List). Pt will benefit from skilled PT to increase their independence and safety with mobility to allow discharge to the venue listed below.       Follow Up Recommendations SNF(long term placement (reports has been trying to get back into facility))    Equipment Recommendations  Other (comment)(TBD next venue)    Recommendations for Other Services       Precautions / Restrictions Precautions Precautions: Fall Precaution Comments: Pt reports no restrictions on L shoulder other than limited by pain.  Latest ortho note from last week recommended HH for PROM and pt to have CT-reports prognosis for shoulder function recovery is poor.       Mobility  Bed Mobility Overal bed mobility: Needs Assistance Bed Mobility: Supine to Sit;Sit to Supine     Supine to sit: Mod assist Sit to supine: Mod assist   General bed  mobility comments: Mod A to elevate trunk and to get legs back in bed  Transfers Overall transfer level: Needs assistance Equipment used: Rolling walker (2 wheeled) Transfers: Sit to/from Stand Sit to Stand: Min assist;From elevated surface         General transfer comment: L UE just to balance - no weight on L UE due to pain; cued for safety  Ambulation/Gait Ambulation/Gait assistance: Min assist Gait Distance (Feet): 8 Feet Assistive device: Rolling walker (2 wheeled) Gait Pattern/deviations: Step-to pattern;Decreased stride length Gait velocity: decreased   General Gait Details: required assist with RW due to unable to push with L UE  Stairs            Wheelchair Mobility    Modified Rankin (Stroke Patients Only)       Balance Overall balance assessment: Needs assistance Sitting-balance support: Feet supported;Single extremity supported Sitting balance-Leahy Scale: Fair     Standing balance support: Bilateral upper extremity supported;During functional activity Standing balance-Leahy Scale: Fair                               Pertinent Vitals/Pain Pain Assessment: 0-10 Pain Score: 6  Pain Location: L shoulder with movement Pain Descriptors / Indicators: Discomfort Pain Intervention(s): Limited activity within patient's tolerance    Home Living Family/patient expects to be discharged to:: Skilled nursing facility  Additional Comments:Per her report: Pt was at [facility] long term (couple years), but transferred to facility in [place] due to [place] outbreak.  Then was transferring back closer to home and that facility had COVID outbreak and pt not allowed in and when pt taken back to [place] her bed no longer available.  So her daughter took her home with her at the end of October.  Daughter now has had possible CVA and no longer able to assist and pt needs placement.    Prior Function Level of Independence:  Needs assistance   Gait / Transfers Assistance Needed: Used RW for household ambulation, but difficulty with RW due to new shoulder injury  ADL's / Homemaking Assistance Needed: Pt required min A for ADLs        Hand Dominance        Extremity/Trunk Assessment   Upper Extremity Assessment Upper Extremity Assessment: LUE deficits/detail LUE Deficits / Details: MMT not tested; Hand and Elbow ROM WFL; Shoulder PROM limited due to pain    Lower Extremity Assessment Lower Extremity Assessment: LLE deficits/detail LLE Deficits / Details: knee ext and ankle DF 4/5: reports chronic; ROM WFL       Communication   Communication: No difficulties  Cognition Arousal/Alertness: Awake/alert Behavior During Therapy: WFL for tasks assessed/performed Overall Cognitive Status: Within Functional Limits for tasks assessed                                        General Comments General comments (skin integrity, edema, etc.): vss;    Exercises     Assessment/Plan    PT Assessment Patient needs continued PT services  PT Problem List Decreased strength;Decreased mobility;Decreased safety awareness;Decreased range of motion;Decreased activity tolerance;Decreased balance;Decreased knowledge of use of DME       PT Treatment Interventions DME instruction;Therapeutic activities;Gait training;Therapeutic exercise;Patient/family education;Balance training;Functional mobility training    PT Goals (Current goals can be found in the Care Plan section)  Acute Rehab PT Goals Patient Stated Goal: find long term NH placement PT Goal Formulation: With patient Time For Goal Achievement: 09/05/19 Potential to Achieve Goals: Good    Frequency Min 2X/week   Barriers to discharge Decreased caregiver support      Co-evaluation               AM-PAC PT "6 Clicks" Mobility  Outcome Measure Help needed turning from your back to your side while in a flat bed without using  bedrails?: A Little Help needed moving from lying on your back to sitting on the side of a flat bed without using bedrails?: A Lot Help needed moving to and from a bed to a chair (including a wheelchair)?: A Lot Help needed standing up from a chair using your arms (e.g., wheelchair or bedside chair)?: A Little Help needed to walk in hospital room?: A Little Help needed climbing 3-5 steps with a railing? : A Lot 6 Click Score: 15    End of Session Equipment Utilized During Treatment: Gait belt Activity Tolerance: Patient tolerated treatment well Patient left: in bed(in ED hallway - no call bell) Nurse Communication: Mobility status PT Visit Diagnosis: Other abnormalities of gait and mobility (R26.89);History of falling (Z91.81)    Time: [id number] PT Time Calculation (min) (ACUTE ONLY): 23 min   Charges:   PT Evaluation $PT Eval Low Complexity: 1 Low  Maggie Font, PT Acute Rehab Services Pager [phone] [facility] [place] 08/22/2019, 5:59 PM

## 2019-08-22 NOTE — Discharge Instructions (Signed)
We have placed orders for home health evaluation tonight.  Please continue to see your doctor regarding placement for your chronic medical issues.  Return with any concerns.

## 2019-08-22 NOTE — Care Management (Signed)
ED CM and ED CSW met with patient to discuss assistance with transitional care. Patient reports that she was sent here fro placement from her PCP office.  Patient has been staying with her daughter at home who has taken sick as well. Patient was recently moved from a long term facility due to a covid outbreak. Patient is seeking to return to another LTF.  CM explained that we would not be able to assist with placement from the ED. Patient states that her son and his wife may be able to assist. CM discussed that patient would benefit from [facility] services patient is agreeable , offered choice from CMS quality list, patient would prefer [facility] as she had in the past.  Referral faxed via [facility] to [facility].  CM explained that someone will contact her 24-48 to arrange [facility] services.

## 2019-08-23 ENCOUNTER — Other Ambulatory Visit: Payer: Self-pay | Admitting: Orthopedic Surgery

## 2019-08-23 ENCOUNTER — Telehealth: Payer: Self-pay | Admitting: *Deleted

## 2019-08-23 DIAGNOSIS — M25512 Pain in left shoulder: Secondary | ICD-10-CM

## 2019-08-23 NOTE — Telephone Encounter (Signed)
[name] states family wanted to have services before the date [facility] can provide.  EDCM contacted [facility] to resume services.

## 2019-08-31 ENCOUNTER — Encounter (HOSPITAL_COMMUNITY): Payer: Self-pay

## 2019-08-31 ENCOUNTER — Emergency Department (HOSPITAL_COMMUNITY)
Admission: EM | Admit: 2019-08-31 | Discharge: 2019-09-01 | Disposition: A | Discharge status: Home / Self Care | Payer: Medicare Other | Attending: Emergency Medicine | Admitting: Emergency Medicine

## 2019-08-31 ENCOUNTER — Other Ambulatory Visit: Payer: Self-pay

## 2019-08-31 ENCOUNTER — Emergency Department (HOSPITAL_COMMUNITY): Payer: Medicare Other

## 2019-08-31 DIAGNOSIS — R0602 Shortness of breath: Secondary | ICD-10-CM | POA: Diagnosis not present

## 2019-08-31 DIAGNOSIS — I130 Hypertensive heart and chronic kidney disease with heart failure and stage 1 through stage 4 chronic kidney disease, or unspecified chronic kidney disease: Secondary | ICD-10-CM | POA: Insufficient documentation

## 2019-08-31 DIAGNOSIS — F0390 Unspecified dementia without behavioral disturbance: Secondary | ICD-10-CM | POA: Diagnosis not present

## 2019-08-31 DIAGNOSIS — R05 Cough: Secondary | ICD-10-CM | POA: Diagnosis present

## 2019-08-31 DIAGNOSIS — R0789 Other chest pain: Secondary | ICD-10-CM | POA: Diagnosis not present

## 2019-08-31 DIAGNOSIS — I509 Heart failure, unspecified: Secondary | ICD-10-CM | POA: Diagnosis not present

## 2019-08-31 DIAGNOSIS — E039 Hypothyroidism, unspecified: Secondary | ICD-10-CM | POA: Diagnosis not present

## 2019-08-31 DIAGNOSIS — R059 Cough, unspecified: Secondary | ICD-10-CM

## 2019-08-31 DIAGNOSIS — N183 Chronic kidney disease, stage 3 unspecified: Secondary | ICD-10-CM | POA: Diagnosis not present

## 2019-08-31 DIAGNOSIS — E1122 Type 2 diabetes mellitus with diabetic chronic kidney disease: Secondary | ICD-10-CM | POA: Diagnosis not present

## 2019-08-31 DIAGNOSIS — R079 Chest pain, unspecified: Secondary | ICD-10-CM

## 2019-08-31 LAB — CBC
HCT: 36 % (ref 36.0–46.0)
Hemoglobin: 11.4 g/dL — ABNORMAL LOW (ref 12.0–15.0)
MCH: 30 pg (ref 26.0–34.0)
MCHC: 31.7 g/dL (ref 30.0–36.0)
MCV: 94.7 fL (ref 80.0–100.0)
Platelets: 237 10*3/uL (ref 150–400)
RBC: 3.8 MIL/uL — ABNORMAL LOW (ref 3.87–5.11)
RDW: 17.1 % — ABNORMAL HIGH (ref 11.5–15.5)
WBC: 7.9 10*3/uL (ref 4.0–10.5)
nRBC: 0 % (ref 0.0–0.2)

## 2019-08-31 LAB — COMPREHENSIVE METABOLIC PANEL
ALT: 18 U/L (ref 0–44)
AST: 24 U/L (ref 15–41)
Albumin: 3.4 g/dL — ABNORMAL LOW (ref 3.5–5.0)
Alkaline Phosphatase: 83 U/L (ref 38–126)
Anion gap: 9 (ref 5–15)
BUN: 16 mg/dL (ref 8–23)
CO2: 26 mmol/L (ref 22–32)
Calcium: 9.4 mg/dL (ref 8.9–10.3)
Chloride: 104 mmol/L (ref 98–111)
Creatinine, Ser: 0.91 mg/dL (ref 0.44–1.00)
GFR calc Af Amer: >60 mL/min (ref >60)
GFR calc non Af Amer: >60 mL/min (ref >60)
Glucose, Bld: 92 mg/dL (ref 70–99)
Potassium: 4.1 mmol/L (ref 3.5–5.1)
Sodium: 139 mmol/L (ref 135–145)
Total Bilirubin: 0.3 mg/dL (ref 0.3–1.2)
Total Protein: 6.2 g/dL — ABNORMAL LOW (ref 6.5–8.1)

## 2019-08-31 LAB — TROPONIN I (HIGH SENSITIVITY): Troponin I (High Sensitivity): 26 ng/L — ABNORMAL HIGH (ref <18)

## 2019-08-31 LAB — BRAIN NATRIURETIC PEPTIDE: B Natriuretic Peptide: 110.1 pg/mL — ABNORMAL HIGH (ref 0.0–100.0)

## 2019-08-31 MED ORDER — BACLOFEN 10 MG PO TABS
10.0000 mg | ORAL_TABLET | Freq: Once | ORAL | Status: AC
  Administered 2019-09-01: 10 mg via ORAL
  Filled 2019-08-31: qty 1

## 2019-08-31 MED ORDER — ROPINIROLE HCL 1 MG PO TABS
4.0000 mg | ORAL_TABLET | Freq: Once | ORAL | Status: AC
  Administered 2019-09-01: 4 mg via ORAL
  Filled 2019-08-31: qty 4

## 2019-08-31 MED ORDER — HYDROCODONE-ACETAMINOPHEN 5-325 MG PO TABS
2.0000 | ORAL_TABLET | Freq: Once | ORAL | Status: AC
  Administered 2019-09-01: 2 via ORAL
  Filled 2019-08-31: qty 2

## 2019-08-31 NOTE — ED Notes (Signed)
Pt placed on external urinary catheter.  

## 2019-08-31 NOTE — ED Provider Notes (Addendum)
[facility] Emergency Department Provider Note MRN:  [id number]  Arrival date & time: 08/31/19     Chief Complaint   Cough   History of Present Illness   [name] is a 74 y.o. year-old female with a history of CKD, CHF, dementia, fibromyalgia, hypertension, stroke presenting to the ED with chief complaint of cough.  2 days of dry cough.  Also experiencing intermittent chest pain and shortness of breath.  Chest pain described as dull, mild in severity.  Shortness of breath is worse when lying flat.  She feels like she has fluid in her lungs again.  History of heart failure.  Denies fever, had a recent negative Covid test.  Denies abdominal pain, no other complaints.  Review of Systems  A complete 10 system review of systems was obtained and all systems are negative except as noted in the HPI and PMH.   Patient's Health History    Past Medical History:  Diagnosis Date  . Anemia   . Anxiety   . Arthritis    "hands, feet" (03/03/2017)  . Brain lesion    2 types  . Cataract   . Cervical spondylosis with myelopathy   . Chest pain    Normal cardiac cath 5/09  . Chronic lower back pain   . CKD (chronic kidney disease), stage III    [name]  . Congestive heart failure (CHF) ([place])    has a Cardiomems implant   . Constipation   . Dementia ([place])   . Depression   . Dumping syndrome   . Dyspnea    with activity  . Edema   . Fatigue   . Fibromyalgia   . GERD (gastroesophageal reflux disease)   . Headache    "w/high CBG" (03/03/2017)  . History of echocardiogram    a. Echo 03/20/16 (done at [facility] in [place], [place]):  mild LVH, EF 86%, normal diastolic function, mild LAE, MAC, RVSP 25 mmHg  . Hyperlipidemia   . Hypertension   . Hypothyroidism   . Lumbar spondylosis   . Lymphedema    seeing specialist for this  . Migraine    "mostly stopped when I changed my diet" (03/03/2017)  . OSA on CPAP   . Pneumonia X 1  . PONV  (postoperative nausea and vomiting)   . Restless leg syndrome   . Rheumatoid arthritis ([place])   . Stroke [facility])    TIAs "mini strokes"- unsure of last TIA - pt and daughter deny this  . Syncope   . Tremors of nervous system   . Type II diabetes mellitus ([place])     Past Surgical History:  Procedure Laterality Date  . APPENDECTOMY  1966  . BACK SURGERY    . CARDIAC CATHETERIZATION N/A 05/25/2016   Procedure: Right/Left Heart Cath and Coronary Angiography;  Surgeon: Larey Dresser, MD;  Location: [facility];  Service: Cardiovascular;  Laterality: N/A;  . CATARACT EXTRACTION W/ INTRAOCULAR LENS  IMPLANT, BILATERAL Bilateral   . COLONOSCOPY    . CORONARY ANGIOGRAM  2009   Normal coronaries  . EXCISION/RELEASE BURSA HIP Bilateral   . I & D KNEE WITH POLY EXCHANGE Left 03/02/2017   Procedure: Left knee Revision of poly liner;  Surgeon: Mcarthur Rossetti, MD;  Location: [facility];  Service: Orthopedics;  Laterality: Left;  . JOINT REPLACEMENT    . KNEE ARTHROSCOPY Bilateral   . LAPAROSCOPIC CHOLECYSTECTOMY  2015  . LMF  2017  CardiMEMS HF implant for CHF (measures amount of fluid in the heart)  . LUMBAR DISC SURGERY    . LUMBAR LAMINECTOMY/DECOMPRESSION MICRODISCECTOMY Left 12/2005   L2-3 laminectomy and diskectomy/notes 01/06/2011  . POSTERIOR LUMBAR FUSION  04/2006   [name] 01/06/2011; "put cages in"  . REVERSE SHOULDER ARTHROPLASTY Left 06/15/2017   Procedure: REVERSE LEFT SHOULDER ARTHROPLASTY;  Surgeon: Meredith Pel, MD;  Location: [facility];  Service: Orthopedics;  Laterality: Left;  . REVERSE SHOULDER ARTHROPLASTY Right 02/01/2018  . REVERSE SHOULDER ARTHROPLASTY Right 02/01/2018   Procedure: RIGHT REVERSE SHOULDER ARTHROPLASTY;  Surgeon: Meredith Pel, MD;  Location: [facility];  Service: Orthopedics;  Laterality: Right;  . REVISION TOTAL KNEE ARTHROPLASTY Left 03/02/2017   poly liner/notes 03/02/2017  . RIGHT HEART CATH N/A 11/13/2016   Procedure: Right Heart Cath with  Cardiomems;  Surgeon: Larey Dresser, MD;  Location: [facility];  Service: Cardiovascular;  Laterality: N/A;  . SHOULDER ARTHROSCOPY WITH ROTATOR CUFF REPAIR Right   . SHOULDER OPEN ROTATOR CUFF REPAIR Left 01/2011   [name] 01/29/2011  . TOTAL ABDOMINAL HYSTERECTOMY    . TOTAL KNEE ARTHROPLASTY Left   . TOTAL KNEE ARTHROPLASTY Right 11/18/2012   Procedure: TOTAL KNEE ARTHROPLASTY;  Surgeon: Yvette Rack., MD;  Location: [facility];  Service: Orthopedics;  Laterality: Right;  . TUBAL LIGATION    . UPPER GASTROINTESTINAL ENDOSCOPY    . UPPER GI ENDOSCOPY      Family History  Problem Relation Age of Onset  . Heart disease Mother   . Heart failure Mother   . Kidney disease Mother   . Lung cancer Father   . Colon cancer Brother   . Cancer Brother        malignant thymoma  . Heart disease Brother   . Tremor Maternal Grandmother 90  . Ovarian cancer Maternal Grandmother   . Uterine cancer Maternal Grandmother   . Irritable bowel syndrome Daughter   . Esophageal cancer Neg Hx   . Stomach cancer Neg Hx   . Inflammatory bowel disease Neg Hx   . Liver disease Neg Hx   . Pancreatic cancer Neg Hx   . Rectal cancer Neg Hx     Social History   Socioeconomic History  . Marital status: Widowed    Spouse name: Not on file  . Number of children: 3  . Years of education: Not on file  . Highest education level: Not on file  Occupational History  . Occupation: retired  Tobacco Use  . Smoking status: Never Smoker  . Smokeless tobacco: Never Used  Substance and Sexual Activity  . Alcohol use: No    Alcohol/week: 0.0 standard drinks  . Drug use: No  . Sexual activity: Not Currently  Other Topics Concern  . Not on file  Social History Narrative   Pt does use caffeine. Lives alone. 3 children. Retired.   Social Determinants of Health   Financial Resource Strain:   . Difficulty of Paying Living Expenses: Not on file  Food Insecurity:   . Worried About Charity fundraiser in the  Last Year: Not on file  . Ran Out of Food in the Last Year: Not on file  Transportation Needs:   . Lack of Transportation (Medical): Not on file  . Lack of Transportation (Non-Medical): Not on file  Physical Activity:   . Days of Exercise per Week: Not on file  . Minutes of Exercise per Session: Not on file  Stress:   .  Feeling of Stress : Not on file  Social Connections:   . Frequency of Communication with Friends and Family: Not on file  . Frequency of Social Gatherings with Friends and Family: Not on file  . Attends Religious Services: Not on file  . Active Member of Clubs or Organizations: Not on file  . Attends Archivist Meetings: Not on file  . Marital Status: Not on file  Intimate Partner Violence:   . Fear of Current or Ex-Partner: Not on file  . Emotionally Abused: Not on file  . Physically Abused: Not on file  . Sexually Abused: Not on file     Physical Exam  Vital Signs and Nursing Notes reviewed Vitals:   08/31/19 2113 08/31/19 2115  BP: (!) 190/94 (!) 186/91  Pulse: (!) 58 65  Resp: 16 19  Temp: 97.8 F (36.6 C)   SpO2: 97% 97%    CONSTITUTIONAL: Chronically ill-appearing, NAD NEURO:  Alert and oriented x 3, no focal deficits EYES:  eyes equal and reactive ENT/NECK:  no LAD, no JVD CARDIO: Regular rate, well-perfused, normal S1 and S2 PULM:  CTAB no wheezing or rhonchi GI/GU:  normal bowel sounds, non-distended, non-tender MSK/SPINE:  No gross deformities, no edema SKIN:  no rash, atraumatic PSYCH:  Appropriate speech and behavior  Diagnostic and Interventional Summary    EKG Interpretation  Date/Time:  08-31-2019 at 22: 12: 57 Ventricular Rate:  59 PR Interval:  157 QRS Duration: 111 QT Interval:  456 QTC Calculation: 452 R Axis:     Text Interpretation: Sinus rhythm, nonspecific intraventricular conduction delay Confirmed by Dr. Gerlene Fee at 10:56 PM.      Labs Reviewed  CBC - Abnormal; Notable for the following components:       Result Value   RBC 3.80 (*)    Hemoglobin 11.4 (*)    RDW 17.1 (*)    All other components within normal limits  COMPREHENSIVE METABOLIC PANEL - Abnormal; Notable for the following components:   Total Protein 6.2 (*)    Albumin 3.4 (*)    All other components within normal limits  TROPONIN I (HIGH SENSITIVITY) - Abnormal; Notable for the following components:   Troponin I (High Sensitivity) 26 (*)    All other components within normal limits  BRAIN NATRIURETIC PEPTIDE    DG Chest 2 View  Final Result      Medications - No data to display   Procedures  /  Critical Care Procedures  ED Course and Medical Decision Making  I have reviewed the triage vital signs and the nursing notes.  Pertinent labs & imaging results that were available during my care of the patient were reviewed by me and considered in my medical decision making (see below for details).     Considering ACS, CHF, less likely PE.  Awaiting laboratory assessment.  Chest x-ray largely unremarkable, possibly some vascular congestion.  Given the orthopnea I anticipate an elevated BNP, but patient to be able to go home after a dose of Lasix as she has been noncompliant with her torsemide for the past 2 days.  Still awaiting laboratory assessment.  Will need to troponins.  First troponin is minimally elevated at 26.  EKG is without ischemic changes.  Patient had a clean cardiac catheterization fairly recently.  Her pain is atypical.  I suspect that her BNP will be elevated and this minimally elevated troponin is related to mild heart strain in the setting of mild CHF.  With a  second troponin at similar level, and with a response to IV Lasix, and with successful ambulation with pulse ox, patient is a candidate for discharge.  Signed out to oncoming provider at shift change.  Barth Kirks. Sedonia Small, [place] [email or web address]  Final Clinical Impressions(s) / ED Diagnoses      ICD-10-CM   1. Cough  R05   2. Chest pain, unspecified type  R07.9   3. Shortness of breath  R06.02     ED Discharge Orders    None       Discharge Instructions Discussed with and Provided to Patient:   Discharge Instructions   None       Maudie Flakes, MD 08/31/19 [id number]    Maudie Flakes, MD 08/31/19 2312

## 2019-08-31 NOTE — ED Notes (Signed)
Patient transported to X-ray 

## 2019-08-31 NOTE — ED Triage Notes (Signed)
[name] for difficulty breathing, coughing spells starting last night. Tested for COVID Tuesday, came back negative today. Pt reports sinus drainage and feeling "crummy." VSS en route, BP 164/88  HX CHF, says she feels like she has excess fluid. Uses CPAP at home 20G in left forearm

## 2019-09-01 ENCOUNTER — Other Ambulatory Visit: Payer: Medicare Other

## 2019-09-01 DIAGNOSIS — R0602 Shortness of breath: Secondary | ICD-10-CM | POA: Diagnosis not present

## 2019-09-01 LAB — TROPONIN I (HIGH SENSITIVITY): Troponin I (High Sensitivity): 27 ng/L — ABNORMAL HIGH (ref <18)

## 2019-09-01 NOTE — Discharge Instructions (Addendum)
Increase your Demadex to 80 mg twice daily for the next 3 to 4 days.  Follow-up with your primary doctor or cardiologist in the next week for a recheck, and return to the emergency department if you develop severe chest pain, worsening breathing, or other new and concerning symptoms.

## 2019-09-01 NOTE — ED Notes (Signed)
Discharge instructions and prescriptions discussed with Pt. Pt verbalized understanding. Pt stable and leaving via wheelchair.

## 2019-09-01 NOTE — ED Provider Notes (Signed)
Care assumed from Dr. Sedonia Small at shift change.  Patient presents here with complaints of cough.  She was signed out to me awaiting results of a BNP and repeat troponin.  The studies have returned with an unchanged troponin and mildly elevated BNP.  Patient is not hypoxic and appears comfortable.  I see no EKG changes and with 2 troponins that are not significantly elevated, I feel as though she is appropriate for discharge.  She is to increase her diuretic for the next several days.   Veryl Speak, MD 09/01/19 [phone]

## 2019-09-01 NOTE — ED Notes (Signed)
Pt in hallway awaiting ride from daughter

## 2019-09-07 ENCOUNTER — Ambulatory Visit: Payer: Medicare Other | Admitting: Orthopedic Surgery

## 2019-09-14 ENCOUNTER — Other Ambulatory Visit: Payer: Medicare Other

## 2019-09-18 ENCOUNTER — Ambulatory Visit: Payer: Medicare Other | Admitting: Orthopedic Surgery

## 2019-09-22 ENCOUNTER — Other Ambulatory Visit: Payer: Medicare Other

## 2019-09-22 ENCOUNTER — Inpatient Hospital Stay: Admission: RE | Admit: 2019-09-22 | Payer: Medicare Other | Source: Ambulatory Visit

## 2019-09-25 ENCOUNTER — Ambulatory Visit: Payer: Medicare Other | Admitting: Orthopedic Surgery

## 2019-10-17 ENCOUNTER — Ambulatory Visit
Admission: RE | Admit: 2019-10-17 | Discharge: 2019-10-17 | Disposition: A | Discharge status: Home / Self Care | Payer: Medicare Other | Source: Ambulatory Visit | Attending: Orthopedic Surgery | Admitting: Orthopedic Surgery

## 2019-10-17 DIAGNOSIS — Z96612 Presence of left artificial shoulder joint: Secondary | ICD-10-CM

## 2019-10-17 DIAGNOSIS — M25512 Pain in left shoulder: Secondary | ICD-10-CM

## 2019-10-30 ENCOUNTER — Ambulatory Visit (HOSPITAL_COMMUNITY)
Admission: RE | Admit: 2019-10-30 | Discharge: 2019-10-30 | Disposition: A | Discharge status: Home / Self Care | Payer: Medicare Other | Source: Ambulatory Visit | Attending: Adult Health | Admitting: Adult Health

## 2019-10-30 ENCOUNTER — Other Ambulatory Visit: Payer: Self-pay

## 2019-10-30 ENCOUNTER — Encounter (HOSPITAL_COMMUNITY): Payer: Self-pay

## 2019-10-30 VITALS — BP 148/80 | HR 67 | Wt 258.1 lb

## 2019-10-30 DIAGNOSIS — N183 Chronic kidney disease, stage 3 unspecified: Secondary | ICD-10-CM | POA: Diagnosis not present

## 2019-10-30 DIAGNOSIS — M797 Fibromyalgia: Secondary | ICD-10-CM | POA: Diagnosis not present

## 2019-10-30 DIAGNOSIS — I10 Essential (primary) hypertension: Secondary | ICD-10-CM | POA: Diagnosis not present

## 2019-10-30 DIAGNOSIS — I2510 Atherosclerotic heart disease of native coronary artery without angina pectoris: Secondary | ICD-10-CM | POA: Diagnosis not present

## 2019-10-30 DIAGNOSIS — Z8249 Family history of ischemic heart disease and other diseases of the circulatory system: Secondary | ICD-10-CM | POA: Diagnosis not present

## 2019-10-30 DIAGNOSIS — F0390 Unspecified dementia without behavioral disturbance: Secondary | ICD-10-CM | POA: Diagnosis not present

## 2019-10-30 DIAGNOSIS — I130 Hypertensive heart and chronic kidney disease with heart failure and stage 1 through stage 4 chronic kidney disease, or unspecified chronic kidney disease: Secondary | ICD-10-CM | POA: Diagnosis not present

## 2019-10-30 DIAGNOSIS — I5032 Chronic diastolic (congestive) heart failure: Secondary | ICD-10-CM | POA: Insufficient documentation

## 2019-10-30 DIAGNOSIS — Z6841 Body Mass Index (BMI) 40.0 and over, adult: Secondary | ICD-10-CM | POA: Insufficient documentation

## 2019-10-30 DIAGNOSIS — G2581 Restless legs syndrome: Secondary | ICD-10-CM | POA: Diagnosis not present

## 2019-10-30 DIAGNOSIS — Z79899 Other long term (current) drug therapy: Secondary | ICD-10-CM | POA: Diagnosis not present

## 2019-10-30 DIAGNOSIS — I890 Lymphedema, not elsewhere classified: Secondary | ICD-10-CM

## 2019-10-30 DIAGNOSIS — E669 Obesity, unspecified: Secondary | ICD-10-CM | POA: Diagnosis not present

## 2019-10-30 DIAGNOSIS — Z7982 Long term (current) use of aspirin: Secondary | ICD-10-CM | POA: Insufficient documentation

## 2019-10-30 DIAGNOSIS — M549 Dorsalgia, unspecified: Secondary | ICD-10-CM | POA: Insufficient documentation

## 2019-10-30 DIAGNOSIS — Z7951 Long term (current) use of inhaled steroids: Secondary | ICD-10-CM | POA: Diagnosis not present

## 2019-10-30 DIAGNOSIS — Z79890 Hormone replacement therapy (postmenopausal): Secondary | ICD-10-CM | POA: Diagnosis not present

## 2019-10-30 DIAGNOSIS — E1122 Type 2 diabetes mellitus with diabetic chronic kidney disease: Secondary | ICD-10-CM | POA: Diagnosis not present

## 2019-10-30 DIAGNOSIS — G8929 Other chronic pain: Secondary | ICD-10-CM | POA: Insufficient documentation

## 2019-10-30 DIAGNOSIS — R079 Chest pain, unspecified: Secondary | ICD-10-CM | POA: Diagnosis not present

## 2019-10-30 LAB — BASIC METABOLIC PANEL
Anion gap: 9 (ref 5–15)
BUN: 24 mg/dL — ABNORMAL HIGH (ref 8–23)
CO2: 31 mmol/L (ref 22–32)
Calcium: 10.3 mg/dL (ref 8.9–10.3)
Chloride: 103 mmol/L (ref 98–111)
Creatinine, Ser: 1.17 mg/dL — ABNORMAL HIGH (ref 0.44–1.00)
GFR calc Af Amer: 53 mL/min — ABNORMAL LOW (ref >60)
GFR calc non Af Amer: 46 mL/min — ABNORMAL LOW (ref >60)
Glucose, Bld: 107 mg/dL — ABNORMAL HIGH (ref 70–99)
Potassium: 4.2 mmol/L (ref 3.5–5.1)
Sodium: 143 mmol/L (ref 135–145)

## 2019-10-30 MED ORDER — AMLODIPINE BESYLATE 2.5 MG PO TABS: 2.5000 mg | ORAL_TABLET | Freq: Every day | ORAL | 3 refills | Status: DC

## 2019-10-30 NOTE — Progress Notes (Signed)
Advanced Heart Failure Clinic Note   Primary Care: Dr. Edrick Oh Renal: Dr Joelyn Oms HF Cardiology: Dr. Aundra Dubin  HPI: [name] is a 74 y.o. with history of chronic diastolic CHF (Echo 99991111 LVEF 65%, Mild LAE), chronic lymphedema, chronic back pain, HTN, CKD stage 3, and fibromyalgia.    Admitted several times in 02/2016 to [facility] with CHF and chest pain.  Has been tried on spironolactone + metolazone alone as diuretic regimen. Follows with renal as above.   She was admitted in 9/17 with chest pain and AKI.  She had seen nephrology and was given metolazone to take daily along with Lasix, creatinine rose considerably.  Lasix was decreased to 40 mg bid.  When creatinine improved, she had right and left heart cath showing nonobstructive CAD and near-normal filling pressures.  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    Had Cardiomems placed on 11/13/16. PA diastolic 9 mmHg at implant time.   Today she returns for HF follow up.Overall feeling fine. Ongoing leg edema. Using lymph edema pumps. Denies SOB/PND/Orthopnea. Appetite ok. No fever or chills. She has not been weighing on a regular basis. Taking all medications provided by SNF.   Labs (9/17): K 4.6, creatinine 1.55, BNP 63 Labs (10/17): K 4.7, creatinine 1.8 Labs ( 09/06/2016): K 3.7 Creatinine 2.05  Labs (1/18): K 5.1, creatinine 2.03 Labs (2/18): hgb 12, K 4.3, creatinine 1.87 Labs (11/13/2016) K 3.8 Creatinine 2.12  Labs (10/18): K 3.7, creatinine 1.6 Labs (1/19): K 4, creatinine 1.46 Labs (01/26/2018): K 4.1 Creatinine 1.47  Labs ( 08/31/19): K 4.1 Creatinine 0.91   Review of systems complete and found to be negative unless listed in HPI.    Past Medical History 1. Chronic diastolic CHF - Echo 99991111 LVEF 65%, Mild LAE - RHC (10/17): mean RA 4, PA 25/11, mean PCWP 5, CI 2.4.  - Echo (2/18): EF 55-60%, mild LVH, normal RV size and systolic function. - Cardiomems implanted 3/18.  2.  CKD stage 3: Follows with nephology. Baseline appears to be creatinine 1.4-1.6.  3. Chronic lymphedema: Has had wraps.  4. HTN 5. Dementia 6. Fibromyalgia 7. Microvascular angina:  - Normal coronaries on Cath 2009 - Stress Myoview 04/09/16 with no ischemia - LHC (10/17) with mild nonobstructive disease.  8. Restless leg syndrome 9. H/o shoulder replacement.   FH: Mother with CHF, brother with "heart disease."  Another brother with h/o MI.   Current Outpatient Medications  Medication Sig Dispense Refill  . acetaminophen (TYLENOL) 650 MG CR tablet Take 650 mg by mouth every 4 (four) hours as needed for pain.    Marland Kitchen antiseptic oral rinse (BIOTENE) LIQD 10 mLs by Mouth Rinse route 4 (four) times daily. (0900, 1000, 1200 & 2100)    . aspirin EC 81 MG tablet Take 1 tablet (81 mg total) by mouth daily. 90 tablet 3  . atorvastatin (LIPITOR) 40 MG tablet Take 40 mg by mouth daily.    . baclofen (LIORESAL) 10 MG tablet Take 10 mg by mouth 3 (three) times daily. (0900, 1400, & 2100)    . budesonide (PULMICORT) 0.5 MG/2ML nebulizer solution Take 0.5 mg by nebulization 2 (two) times daily. (1000 & 2200)    . carvedilol (COREG) 6.25 MG tablet Take 6.25 mg by mouth 2 (two) times daily with a meal.    . Cholecalciferol (VITAMIN D3) 1000 units CAPS Take 1,000 Units by mouth daily. (0900)    . Cyanocobalamin (VITAMIN B-12 IJ) Inject  1,000 mcg as directed every 30 (thirty) days. Weekly for 4 weeks, has 1 more dose this week (week of 7/2) then will switch to once a month     . cyclobenzaprine (FLEXERIL) 5 MG tablet Take 10 mg by mouth 2 (two) times daily. (0800 & 2100)    . docusate sodium (COLACE) 100 MG capsule Take 100 mg by mouth 2 (two) times daily.    Marland Kitchen donepezil (ARICEPT) 5 MG tablet Take 5 mg by mouth daily at 10 pm. (2100)    . DULoxetine (CYMBALTA) 30 MG capsule Take 30 mg by mouth daily. (0900)    . erythromycin ophthalmic ointment 1 application at bedtime.    . gabapentin (NEURONTIN) 300 MG  capsule Take 400 mg by mouth 2 (two) times daily. (0900 & 2100)    . HYDROcodone-acetaminophen (NORCO) 7.5-325 MG tablet Take 1 tablet by mouth every 6 (six) hours as needed.     Marland Kitchen ipratropium-albuterol (DUONEB) 0.5-2.5 (3) MG/3ML SOLN Take 3 mLs by nebulization every 6 (six) hours as needed (FOR WHEEZING/SHORTNESS OF BREATH).    Marland Kitchen lamoTRIgine (LAMICTAL) 100 MG tablet Take 100 mg by mouth 2 (two) times daily. (0900 & 2100)    . levothyroxine (SYNTHROID, LEVOTHROID) 88 MCG tablet Take 88 mcg by mouth daily before breakfast. (0900)    . loratadine (CLARITIN) 10 MG tablet Take 10 mg by mouth daily.    . Melatonin 3 MG CAPS Take 3 mg by mouth at bedtime. (2100)    . omeprazole (PRILOSEC) 20 MG capsule Take 20 mg by mouth 2 (two) times daily before a meal. (0900 & 1700)    . torsemide (DEMADEX) 20 MG tablet Take 80 mg by mouth daily.    . traZODone (DESYREL) 50 MG tablet Take 50 mg by mouth at bedtime. (2100)    . ferrous gluconate (FERGON) 324 MG tablet Take 1 tablet (324 mg total) by mouth 2 (two) times daily with a meal. 84 tablet 0   No current facility-administered medications for this encounter.   No Known Allergies  Social History   Socioeconomic History  . Marital status: Widowed    Spouse name: Not on file  . Number of children: 3  . Years of education: Not on file  . Highest education level: Not on file  Occupational History  . Occupation: retired  Tobacco Use  . Smoking status: Never Smoker  . Smokeless tobacco: Never Used  Substance and Sexual Activity  . Alcohol use: No    Alcohol/week: 0.0 standard drinks  . Drug use: No  . Sexual activity: Not Currently  Other Topics Concern  . Not on file  Social History Narrative   Pt does use caffeine. Lives alone. 3 children. Retired.   Social Determinants of Health   Financial Resource Strain:   . Difficulty of Paying Living Expenses: Not on file  Food Insecurity:   . Worried About Charity fundraiser in the Last Year: Not  on file  . Ran Out of Food in the Last Year: Not on file  Transportation Needs:   . Lack of Transportation (Medical): Not on file  . Lack of Transportation (Non-Medical): Not on file  Physical Activity:   . Days of Exercise per Week: Not on file  . Minutes of Exercise per Session: Not on file  Stress:   . Feeling of Stress : Not on file  Social Connections:   . Frequency of Communication with Friends and Family: Not on file  . Frequency  of Social Gatherings with Friends and Family: Not on file  . Attends Religious Services: Not on file  . Active Member of Clubs or Organizations: Not on file  . Attends Archivist Meetings: Not on file  . Marital Status: Not on file  Intimate Partner Violence:   . Fear of Current or Ex-Partner: Not on file  . Emotionally Abused: Not on file  . Physically Abused: Not on file  . Sexually Abused: Not on file   Vitals:   10/30/19 0905  BP: (!) 148/80  Pulse: 67  SpO2: 95%  Weight: 117.1 kg (258 lb 2 oz)   Wt Readings from Last 3 Encounters:  10/30/19 117.1 kg (258 lb 2 oz)  08/31/19 119.3 kg (263 lb)  09/01/18 124.7 kg (275 lb)   PHYSICAL EXAM: General:  Arrived in wheel chair. . No resp difficulty HEENT: normal Neck: supple. no JVD. Carotids 2+ bilat; no bruits. No lymphadenopathy or thryomegaly appreciated. Cor: PMI nondisplaced. Regular rate & rhythm. No rubs, gallops or murmurs. Lungs: clear Abdomen: soft, nontender, nondistended. No hepatosplenomegaly. No bruits or masses. Good bowel sounds. Extremities: no cyanosis, clubbing, rash, edema Neuro: alert & orientedx3, cranial nerves grossly intact. moves all 4 extremities w/o difficulty. Affect pleasant  ASSESSMENT & PLAN: 1. Chronic diastolic CHF:  Echo (0000000) with EF 55-60%, normal RV.  Has Cardiomems. PADP at goal at 9 mmHg today (goal has been 6-13 mmHg).   Todays Cardiomems reading is -->PAD 6 mmHG - NYHA II-III. Volume status stable.  - Continue torsemide 80 mg daily.   -  She needs to take ASA 81 mg daily given Cardiomems present.  2. Lymphedema: Continue lymph edema pumps.    3. CKD: Stage 3 -Check BMET  4. Chest pain: LHC 2017 Nonobstructive.   No s/s ischemia.    5. HTN:  Elevated. Add 2.5 mg amlodipine daily.   6. DM2:  Per PCP 7. Obesity-Body mass index is 41.66 kg/m. Discussed portion control.    Follow up in 6 months with Dr Vaughan Browner.      Darrick Grinder, NP  10/30/2019

## 2019-10-30 NOTE — Patient Instructions (Signed)
Lab work done today. We will notify you of any abnormal lab work. No news is good news!  START Amlodipine 2.5mg  tab daily.  APPLY compression stockings daily at 6 am.  USE Cardiomems daily.  Please follow up with the [facility] in 6 months.  At the [facility], you and your health needs are our priority. As part of our continuing mission to provide you with exceptional heart care, we have created designated Provider Care Teams. These Care Teams include your primary Cardiologist (physician) and Advanced Practice Providers (APPs- Physician Assistants and Nurse Practitioners) who all work together to provide you with the care you need, when you need it.   You may see any of the following providers on your designated Care Team at your next follow up: Marland Kitchen Dr Glori Bickers . Dr Loralie Champagne . Darrick Grinder, NP . Lyda Jester, PA . Audry Riles, PharmD   Please be sure to bring in all your medications bottles to every appointment.

## 2019-11-17 ENCOUNTER — Ambulatory Visit (INDEPENDENT_AMBULATORY_CARE_PROVIDER_SITE_OTHER): Payer: Medicare Other | Admitting: Orthopedic Surgery

## 2019-11-17 ENCOUNTER — Other Ambulatory Visit: Payer: Self-pay

## 2019-11-17 ENCOUNTER — Encounter: Payer: Self-pay | Admitting: Orthopedic Surgery

## 2019-11-17 DIAGNOSIS — M25512 Pain in left shoulder: Secondary | ICD-10-CM | POA: Diagnosis not present

## 2019-11-17 NOTE — Progress Notes (Signed)
Office Visit Note   Patient: [name]           Date of Birth: [birth year]           MRN: [id number] Visit Date: 11/17/2019 Requested by: [name], [place],   [id number] PCP: [name]  Subjective: Chief Complaint  Patient presents with  . Follow-up    HPI: [name] is a patient with left shoulder pain.  Here to review CT scan.  In general her shoulder is improving slowly.  She does have some functional ability to get the arm away from her torso on the left-hand side.  She does have acromial fracture which is old as well as a new scapular spine fractures seen on CT scan..  She is back in the nursing home.  She does report falling out of a wheelchair which initiated some of this pain.  She is able to walk at 1000 feet.  Hard for her to pick anything up but she is able to weight-bear to some degree through that left arm.              ROS: All systems reviewed are negative as they relate to the chief complaint within the history of present illness.  Patient denies  fevers or chills.   Assessment & Plan: Visit Diagnoses:  1. Left shoulder pain, unspecified chronicity     Plan: Impression is multiple acromial and scapular spine fractures in a patient with reverse shoulder replacement.  She does have pretty good passive range of motion.  I think she is going get a fibrous union with these fractures and be mildly functional.  Would not recommend any intervention at this time.  No real adverse biomechanical abnormalities in the placement of the implants.  I think in general [name] has propensity for complication.  She is doing better and thus I think it was a good decision not to do any type of operative intervention at the onset particularly with this recent scapular spine fracture.  Follow-up in 4 months for clinical recheck.  Follow-Up Instructions: No follow-ups on file.   Orders:  No orders of the defined types were placed in  this encounter.  No orders of the defined types were placed in this encounter.     Procedures: No procedures performed   Clinical Data: No additional findings.  Objective: Vital Signs: There were no vitals taken for this visit.  Physical Exam:   Constitutional: Patient appears well-developed HEENT:  Head: Normocephalic Eyes:EOM are normal Neck: Normal range of motion Cardiovascular: Normal rate Pulmonary/chest: Effort normal Neurologic: Patient is alert Skin: Skin is warm Psychiatric: Patient has normal mood and affect    Ortho Exam: Ortho exam demonstrates mild pain with passive range of motion of that left arm.  Deltoid is functional.  Shoulder is reduced.  Passive range of motion is symmetric with the right-hand side.  Does have mild tenderness to palpation but no real swelling around the scapular and scapular spine area.  Motor sensory function hand is intact  Specialty Comments:  No specialty comments available.  Imaging: No results found.   PMFS History: Patient Active Problem List   Diagnosis Date Noted  . Iron deficiency 08/06/2018  . History of total left knee replacement (TKR) 07/18/2018  . Rectal bleeding 07/05/2018  . Hemorrhoids 07/05/2018  . Anal itching 07/05/2018  . Rotator cuff arthropathy of right shoulder   . Shoulder arthritis 06/15/2017  . Status post revision of  total knee replacement, left 04/29/2017  . Polyethylene liner wear following left total knee arthroplasty requiring isolated polyethylene liner exchange ([place]) 03/02/2017  . 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 Unstable angina ([place]) 05/21/2016  . Chronic diastolic heart failure ([place]) 03/29/2016  . Normal coronary arteries 2009 01/14/2015  . Obesity-BMI 40 01/14/2015  . Restless leg 01/14/2015  . Chest pain 01/14/2015  . Back pain 01/14/2015  . Renal insufficiency 01/14/2015  . Dementia- mild memory issues 01/14/2015  . Occult blood positive stool  12/14/2014  . Anemia 12/14/2014  . Family history of colon cancer 12/14/2014  . Change in bowel habits 12/14/2014  . GERD (gastroesophageal reflux disease) 12/14/2014  . Dyspnea 05/17/2012  . Lymphedema 05/17/2012  . Weight gain 05/17/2012  . HTN (hypertension) 05/17/2012   Past Medical History:  Diagnosis Date  . Anemia   . Anxiety   . Arthritis    "hands, feet" (03/03/2017)  . Brain lesion    2 types  . Cataract   . Cervical spondylosis with myelopathy   . Chest pain    Normal cardiac cath 5/09  . Chronic lower back pain   . CKD (chronic kidney disease), stage III    [name]  . Congestive heart failure (CHF) ([place])    has a Cardiomems implant   . Constipation   . Dementia ([place])   . Depression   . Dumping syndrome   . Dyspnea    with activity  . Edema   . Fatigue   . Fibromyalgia   . GERD (gastroesophageal reflux disease)   . Headache    "w/high CBG" (03/03/2017)  . History of echocardiogram    a. Echo 03/20/16 (done at [facility] in [place], [place]):  mild LVH, EF 81%, normal diastolic function, mild LAE, MAC, RVSP 25 mmHg  . Hyperlipidemia   . Hypertension   . Hypothyroidism   . Lumbar spondylosis   . Lymphedema    seeing specialist for this  . Migraine    "mostly stopped when I changed my diet" (03/03/2017)  . OSA on CPAP   . Pneumonia X 1  . PONV (postoperative nausea and vomiting)   . Restless leg syndrome   . Rheumatoid arthritis ([place])   . Stroke [facility])    TIAs "mini strokes"- unsure of last TIA - pt and daughter deny this  . Syncope   . Tremors of nervous system   . Type II diabetes mellitus ([facility])     Family History  Problem Relation Age of Onset  . Heart disease Mother   . Heart failure Mother   . Kidney disease Mother   . Lung cancer Father   . Colon cancer Brother   . Cancer Brother        malignant thymoma  . Heart disease Brother   . Tremor Maternal Grandmother 90  . Ovarian cancer Maternal Grandmother   . Uterine  cancer Maternal Grandmother   . Irritable bowel syndrome Daughter   . Esophageal cancer Neg Hx   . Stomach cancer Neg Hx   . Inflammatory bowel disease Neg Hx   . Liver disease Neg Hx   . Pancreatic cancer Neg Hx   . Rectal cancer Neg Hx     Past Surgical History:  Procedure Laterality Date  . APPENDECTOMY  1966  . BACK SURGERY    . CARDIAC CATHETERIZATION N/A 05/25/2016   Procedure: Right/Left Heart Cath and Coronary Angiography;  Surgeon: Elby Showers  Aundra Dubin, [name];  Location: [place] [facility];  Service: Cardiovascular;  Laterality: N/A;  . CATARACT EXTRACTION W/ INTRAOCULAR LENS  IMPLANT, BILATERAL Bilateral   . COLONOSCOPY    . CORONARY ANGIOGRAM  2009   Normal coronaries  . EXCISION/RELEASE BURSA HIP Bilateral   . I & D KNEE WITH POLY EXCHANGE Left 03/02/2017   Procedure: Left knee Revision of poly liner;  Surgeon: Mcarthur Rossetti, [name];  Location: [place];  Service: Orthopedics;  Laterality: Left;  . JOINT REPLACEMENT    . KNEE ARTHROSCOPY Bilateral   . LAPAROSCOPIC CHOLECYSTECTOMY  2015  . LMF  2017   CardiMEMS HF implant for CHF (measures amount of fluid in the heart)  . LUMBAR DISC SURGERY    . LUMBAR LAMINECTOMY/DECOMPRESSION MICRODISCECTOMY Left 12/2005   L2-3 laminectomy and diskectomy/notes 01/06/2011  . POSTERIOR LUMBAR FUSION  04/2006   [name] 01/06/2011; "put cages in"  . REVERSE SHOULDER ARTHROPLASTY Left 06/15/2017   Procedure: REVERSE LEFT SHOULDER ARTHROPLASTY;  Surgeon: Meredith Pel, [name];  Location: [facility];  Service: Orthopedics;  Laterality: Left;  . REVERSE SHOULDER ARTHROPLASTY Right 02/01/2018  . REVERSE SHOULDER ARTHROPLASTY Right 02/01/2018   Procedure: RIGHT REVERSE SHOULDER ARTHROPLASTY;  Surgeon: Meredith Pel, [name];  Location: [place];  Service: Orthopedics;  Laterality: Right;  . REVISION TOTAL KNEE ARTHROPLASTY Left 03/02/2017   poly liner/notes 03/02/2017  . RIGHT HEART CATH N/A 11/13/2016   Procedure: Right Heart Cath with Cardiomems;   Surgeon: Larey Dresser, [name];  Location: [facility] [facility];  Service: Cardiovascular;  Laterality: N/A;  . SHOULDER ARTHROSCOPY WITH ROTATOR CUFF REPAIR Right   . SHOULDER OPEN ROTATOR CUFF REPAIR Left 01/2011   [name] 01/29/2011  . TOTAL ABDOMINAL HYSTERECTOMY    . TOTAL KNEE ARTHROPLASTY Left   . TOTAL KNEE ARTHROPLASTY Right 11/18/2012   Procedure: TOTAL KNEE ARTHROPLASTY;  Surgeon: Yvette Rack., [name];  Location: [place] [facility];  Service: Orthopedics;  Laterality: Right;  . TUBAL LIGATION    . UPPER GASTROINTESTINAL ENDOSCOPY    . UPPER GI ENDOSCOPY     Social History   Occupational History  . Occupation: retired  Tobacco Use  . Smoking status: Never Smoker  . Smokeless tobacco: Never Used  Substance and Sexual Activity  . Alcohol use: No    Alcohol/week: 0.0 standard drinks  . Drug use: No  . Sexual activity: Not Currently

## 2019-11-27 ENCOUNTER — Other Ambulatory Visit: Payer: Self-pay

## 2019-11-27 ENCOUNTER — Ambulatory Visit (HOSPITAL_COMMUNITY): Admission: RE | Admit: 2019-11-27 | Payer: Medicare Other | Source: Ambulatory Visit

## 2019-11-29 ENCOUNTER — Encounter: Payer: Self-pay | Admitting: Orthopaedic Surgery

## 2019-11-29 ENCOUNTER — Ambulatory Visit: Payer: Self-pay

## 2019-11-29 ENCOUNTER — Ambulatory Visit (INDEPENDENT_AMBULATORY_CARE_PROVIDER_SITE_OTHER): Payer: Medicare Other | Admitting: Orthopaedic Surgery

## 2019-11-29 ENCOUNTER — Other Ambulatory Visit: Payer: Self-pay

## 2019-11-29 DIAGNOSIS — M25562 Pain in left knee: Secondary | ICD-10-CM

## 2019-11-29 NOTE — Progress Notes (Signed)
Office Visit Note   Patient: [name]           Date of Birth: 01/02/1946           MRN: [id number] Visit Date: 11/29/2019              Requested by: Hilbert Corrigan, [address] [place],  [place] [id number] PCP: Hilbert Corrigan, MD   Assessment & Plan: Visit Diagnoses:  1. Left knee pain, unspecified chronicity     Plan: Based on her size and weight I do not feel that she is a candidate for revision surgery that would involve trying to realign her femoral and tibial components to allow her patella tracked better.  I have recommended at least a hinged knee brace and weight loss as well as continued therapy to work on quad strengthening as well as VMO strengthening of the left knee.  We have also recommended decrease caloric intake and a high-protein low carbohydrate diet.  I did give her a prescription for [employer] for a custom knee brace that they can try to fit for her given her size.  All question concerns were answered and addressed.  Follow-up as otherwise as needed.  Follow-Up Instructions: Return if symptoms worsen or fail to improve.   Orders:  Orders Placed This Encounter  Procedures  . XR Knee 1-2 Views Left   No orders of the defined types were placed in this encounter.     Procedures: No procedures performed   Clinical Data: No additional findings.   Subjective: Chief Complaint  Patient presents with  . Left Knee - Pain  The patient is a 74 year old morbidly obese female well-known to [place].  She has a history of bilateral knee replacements.  They were done by 2 different surgeons here in town.  Sometime ago we did upsize her polyliner on her left knee due to chronic instability.  Her left knee patella tracks laterally and I do feel this is due to alignment issues with her components but also due to her morbid obesity.  Her thighs and knees are very large.  She does mainly get around in a wheelchair.  She stays in a nursing care facility.   She states that she would need instructions for high-protein and low calorie diet as well as physical therapy and a knee brace.  She does have trouble getting around in general.  There has been no acute change in her medical status.  She is very slow to mobilize HPI  Review of Systems She currently denies any headache, chest pain, shortness of breath, fever, chills, nausea, vomiting  Objective: Vital Signs: There were no vitals taken for this visit.  Physical Exam She is alert and orient x3 and in no acute distress Ortho Exam Examination of her left knee does show her patella tracks laterally.  The knee is otherwise ligamentously stable.  There is a very large soft tissue envelope around both knees. Specialty Comments:  No specialty comments available.  Imaging: XR Knee 1-2 Views Left  Result Date: 11/29/2019 2 views of the left knee show a well-seated total knee arthroplasty.  The patella appears to chronically track laterally.  There is no evidence of loosening of the hardware.    PMFS History: Patient Active Problem List   Diagnosis Date Noted  . Iron deficiency 08/06/2018  . History of total left knee replacement (TKR) 07/18/2018  . Rectal bleeding 07/05/2018  . Hemorrhoids 07/05/2018  . Anal itching 07/05/2018  .  Rotator cuff arthropathy of right shoulder   . Shoulder arthritis 06/15/2017  . Status post revision of total knee replacement, left 04/29/2017  . Polyethylene liner wear following left total knee arthroplasty requiring isolated polyethylene liner exchange ([place]) 03/02/2017  . 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 Unstable angina ([place]) 05/21/2016  . Chronic diastolic heart failure ([place]) 03/29/2016  . Normal coronary arteries 2009 01/14/2015  . Obesity-BMI 40 01/14/2015  . Restless leg 01/14/2015  . Chest pain 01/14/2015  . Back pain 01/14/2015  . Renal insufficiency 01/14/2015  . Dementia- mild memory issues 01/14/2015  . Occult  blood positive stool 12/14/2014  . Anemia 12/14/2014  . Family history of colon cancer 12/14/2014  . Change in bowel habits 12/14/2014  . GERD (gastroesophageal reflux disease) 12/14/2014  . Dyspnea 05/17/2012  . Lymphedema 05/17/2012  . Weight gain 05/17/2012  . HTN (hypertension) 05/17/2012   Past Medical History:  Diagnosis Date  . Anemia   . Anxiety   . Arthritis    "hands, feet" (03/03/2017)  . Brain lesion    2 types  . Cataract   . Cervical spondylosis with myelopathy   . Chest pain    Normal cardiac cath 5/09  . Chronic lower back pain   . CKD (chronic kidney disease), stage III    [name]  . Congestive heart failure (CHF) ([place])    has a Cardiomems implant   . Constipation   . Dementia ([place])   . Depression   . Dumping syndrome   . Dyspnea    with activity  . Edema   . Fatigue   . Fibromyalgia   . GERD (gastroesophageal reflux disease)   . Headache    "w/high CBG" (03/03/2017)  . History of echocardiogram    a. Echo 03/20/16 (done at [facility] in [place], [place]):  mild LVH, EF 44%, normal diastolic function, mild LAE, MAC, RVSP 25 mmHg  . Hyperlipidemia   . Hypertension   . Hypothyroidism   . Lumbar spondylosis   . Lymphedema    seeing specialist for this  . Migraine    "mostly stopped when I changed my diet" (03/03/2017)  . OSA on CPAP   . Pneumonia X 1  . PONV (postoperative nausea and vomiting)   . Restless leg syndrome   . Rheumatoid arthritis ([place])   . Stroke [facility])    TIAs "mini strokes"- unsure of last TIA - pt and daughter deny this  . Syncope   . Tremors of nervous system   . Type II diabetes mellitus (HCC)     Family History  Problem Relation Age of Onset  . Heart disease Mother   . Heart failure Mother   . Kidney disease Mother   . Lung cancer Father   . Colon cancer Brother   . Cancer Brother        malignant thymoma  . Heart disease Brother   . Tremor Maternal Grandmother 90  . Ovarian cancer Maternal  Grandmother   . Uterine cancer Maternal Grandmother   . Irritable bowel syndrome Daughter   . Esophageal cancer Neg Hx   . Stomach cancer Neg Hx   . Inflammatory bowel disease Neg Hx   . Liver disease Neg Hx   . Pancreatic cancer Neg Hx   . Rectal cancer Neg Hx     Past Surgical History:  Procedure Laterality Date  . APPENDECTOMY  1966  . BACK SURGERY    .  CARDIAC CATHETERIZATION N/A 05/25/2016   Procedure: Right/Left Heart Cath and Coronary Angiography;  Surgeon: Larey Dresser, MD;  Location: [facility];  Service: Cardiovascular;  Laterality: N/A;  . CATARACT EXTRACTION W/ INTRAOCULAR LENS  IMPLANT, BILATERAL Bilateral   . COLONOSCOPY    . CORONARY ANGIOGRAM  2009   Normal coronaries  . EXCISION/RELEASE BURSA HIP Bilateral   . I & D KNEE WITH POLY EXCHANGE Left 03/02/2017   Procedure: Left knee Revision of poly liner;  Surgeon: Mcarthur Rossetti, MD;  Location: [facility];  Service: Orthopedics;  Laterality: Left;  . JOINT REPLACEMENT    . KNEE ARTHROSCOPY Bilateral   . LAPAROSCOPIC CHOLECYSTECTOMY  2015  . LMF  2017   CardiMEMS HF implant for CHF (measures amount of fluid in the heart)  . LUMBAR DISC SURGERY    . LUMBAR LAMINECTOMY/DECOMPRESSION MICRODISCECTOMY Left 12/2005   L2-3 laminectomy and diskectomy/notes 01/06/2011  . POSTERIOR LUMBAR FUSION  04/2006   [name] 01/06/2011; "put cages in"  . REVERSE SHOULDER ARTHROPLASTY Left 06/15/2017   Procedure: REVERSE LEFT SHOULDER ARTHROPLASTY;  Surgeon: Meredith Pel, MD;  Location: [facility];  Service: Orthopedics;  Laterality: Left;  . REVERSE SHOULDER ARTHROPLASTY Right 02/01/2018  . REVERSE SHOULDER ARTHROPLASTY Right 02/01/2018   Procedure: RIGHT REVERSE SHOULDER ARTHROPLASTY;  Surgeon: Meredith Pel, MD;  Location: [facility];  Service: Orthopedics;  Laterality: Right;  . REVISION TOTAL KNEE ARTHROPLASTY Left 03/02/2017   poly liner/notes 03/02/2017  . RIGHT HEART CATH N/A 11/13/2016   Procedure: Right Heart Cath  with Cardiomems;  Surgeon: Larey Dresser, MD;  Location: [facility];  Service: Cardiovascular;  Laterality: N/A;  . SHOULDER ARTHROSCOPY WITH ROTATOR CUFF REPAIR Right   . SHOULDER OPEN ROTATOR CUFF REPAIR Left 01/2011   [name] 01/29/2011  . TOTAL ABDOMINAL HYSTERECTOMY    . TOTAL KNEE ARTHROPLASTY Left   . TOTAL KNEE ARTHROPLASTY Right 11/18/2012   Procedure: TOTAL KNEE ARTHROPLASTY;  Surgeon: Yvette Rack., MD;  Location: [facility];  Service: Orthopedics;  Laterality: Right;  . TUBAL LIGATION    . UPPER GASTROINTESTINAL ENDOSCOPY    . UPPER GI ENDOSCOPY     Social History   Occupational History  . Occupation: retired  Tobacco Use  . Smoking status: Never Smoker  . Smokeless tobacco: Never Used  Substance and Sexual Activity  . Alcohol use: No    Alcohol/week: 0.0 standard drinks  . Drug use: No  . Sexual activity: Not Currently

## 2019-12-18 ENCOUNTER — Telehealth: Payer: Self-pay | Admitting: Orthopedic Surgery

## 2019-12-18 NOTE — Telephone Encounter (Signed)
Ms. Roethel called in regarding the hinged knee brace that she needs from [facility].  She says her rx for the brace was sent to her nursing home, she would like [place] to send it to [facility].  [employer] advised her if they had the rx, they could come out to her nursing home to fit her for the brace.  She left this # for call back [phone].

## 2019-12-19 NOTE — Telephone Encounter (Signed)
LMOM for patient letting her know we sent Rx to [facility] for her

## 2020-01-17 ENCOUNTER — Ambulatory Visit (INDEPENDENT_AMBULATORY_CARE_PROVIDER_SITE_OTHER): Payer: Medicare Other | Admitting: Orthopedic Surgery

## 2020-01-17 ENCOUNTER — Ambulatory Visit: Payer: Self-pay

## 2020-01-17 ENCOUNTER — Other Ambulatory Visit: Payer: Self-pay

## 2020-01-17 DIAGNOSIS — M792 Neuralgia and neuritis, unspecified: Secondary | ICD-10-CM

## 2020-01-17 DIAGNOSIS — Z8739 Personal history of other diseases of the musculoskeletal system and connective tissue: Secondary | ICD-10-CM | POA: Diagnosis not present

## 2020-01-17 DIAGNOSIS — M542 Cervicalgia: Secondary | ICD-10-CM

## 2020-01-22 ENCOUNTER — Encounter: Payer: Self-pay | Admitting: Orthopedic Surgery

## 2020-01-22 NOTE — Progress Notes (Signed)
Office Visit Note   Patient: [name]           Date of Birth: [birth year]           MRN: [id number] Visit Date: 01/17/2020 Requested by: Hilbert Corrigan, [address] [place],  [place] [address] PCP: Hilbert Corrigan, MD  Subjective: Chief Complaint  Patient presents with  . Neck - Pain    HPI: [name] is a 74 y.o. female who presents to the office complaining of neck pain.  Patient complains of axial neck pain with radiation to the left side of the neck and down to the left elbow.  Symptoms have been ongoing for several weeks..  Pain wakes her up at night.  She has tingling as well as an aching/burning pain that travels down her left arm to the elbow.  She has a history of an MRI of her cervical spine 8 to 9 years ago that according to her showed 2 disc herniations.  She had no treatment at that time but surgery was recommended.  She takes occasional Tylenol with some relief and has tried a home exercise program without significant relief from that.  She notes subjective weakness of the left arm.  She denies any recent injury leading to onset of pain.  She does have pain that wakes her up at night but denies any rest pain..   She does have a history of left reverse shoulder replacement with subsequent acromial fracture treated nonoperatively.  That seems to have stabilized but the neck pain and radicular arm pain is new.             ROS:  All systems reviewed are negative as they relate to the chief complaint within the history of present illness.  Patient denies fevers or chills.  Assessment & Plan: Visit Diagnoses:  1. Neck pain   2. Radicular pain in left arm   3. History of herniated intervertebral disc     Plan: Patient is a 74 year old female presents complaining of neck pain.  She has radicular symptoms that extend down the arm to the left elbow with tingling and a burning pain.  She has a history of previous MRI that showed disc herniations.  No  treatment done at that time.  She does have some weakness on exam today with elbow flexion and tricep extension.  Cervical spine radiographs taken today reveal severe degenerative changes, especially of the lower intervertebral disc spaces.  Ordered MRI of the cervical spine to evaluate further.  Disc herniation and foraminal nerve encroachment is likely based on exam.  May need cervical spine epidural steroid injections.  Patient wants to avoid surgery.  Patient and daughter agree with plan.  Follow-up after MRI to review results.  Follow-Up Instructions: No follow-ups on file.   Orders:  Orders Placed This Encounter  Procedures  . XR Cervical Spine 2 or 3 views  . MR Cervical Spine w/o contrast   No orders of the defined types were placed in this encounter.     Procedures: No procedures performed   Clinical Data: No additional findings.  Objective: Vital Signs: There were no vitals taken for this visit.  Physical Exam:  Constitutional: Patient appears well-developed HEENT:  Head: Normocephalic Eyes:EOM are normal Neck: Normal range of motion Cardiovascular: Normal rate Pulmonary/chest: Effort normal Neurologic: Patient is alert Skin: Skin is warm Psychiatric: Patient has normal mood and affect  Ortho Exam:  Tenderness to palpation throughout the axial cervical  spine and left-sided paraspinal musculature.  5/5 motor strength of the bilateral grip strength, finger abduction, supination, pronation.  4/5 motor strength of the left bicep and tricep compared with the 5/5 motor strength of the contralateral arm.  Sensation intact through all dermatomes of the bilateral upper extremities.  Negative Spurling sign bilaterally.  Specialty Comments:  No specialty comments available.  Imaging: No results found.   PMFS History: Patient Active Problem List   Diagnosis Date Noted  . Iron deficiency 08/06/2018  . History of total left knee replacement (TKR) 07/18/2018  . Rectal  bleeding 07/05/2018  . Hemorrhoids 07/05/2018  . Anal itching 07/05/2018  . Rotator cuff arthropathy of right shoulder   . Shoulder arthritis 06/15/2017  . Status post revision of total knee replacement, left 04/29/2017  . Polyethylene liner wear following left total knee arthroplasty requiring isolated polyethylene liner exchange ([place]) 03/02/2017  . 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 Unstable angina ([place]) 05/21/2016  . Chronic diastolic heart failure ([place]) 03/29/2016  . Normal coronary arteries 2009 01/14/2015  . Obesity-BMI 40 01/14/2015  . Restless leg 01/14/2015  . Chest pain 01/14/2015  . Back pain 01/14/2015  . Renal insufficiency 01/14/2015  . Dementia- mild memory issues 01/14/2015  . Occult blood positive stool 12/14/2014  . Anemia 12/14/2014  . Family history of colon cancer 12/14/2014  . Change in bowel habits 12/14/2014  . GERD (gastroesophageal reflux disease) 12/14/2014  . Dyspnea 05/17/2012  . Lymphedema 05/17/2012  . Weight gain 05/17/2012  . HTN (hypertension) 05/17/2012   Past Medical History:  Diagnosis Date  . Anemia   . Anxiety   . Arthritis    "hands, feet" (03/03/2017)  . Brain lesion    2 types  . Cataract   . Cervical spondylosis with myelopathy   . Chest pain    Normal cardiac cath 5/09  . Chronic lower back pain   . CKD (chronic kidney disease), stage III    [name]  . Congestive heart failure (CHF) ([place])    has a Cardiomems implant   . Constipation   . Dementia ([place])   . Depression   . Dumping syndrome   . Dyspnea    with activity  . Edema   . Fatigue   . Fibromyalgia   . GERD (gastroesophageal reflux disease)   . Headache    "w/high CBG" (03/03/2017)  . History of echocardiogram    a. Echo 03/20/16 (done at [facility] in [place], [place]):  mild LVH, EF 51%, normal diastolic function, mild LAE, MAC, RVSP 25 mmHg  . Hyperlipidemia   . Hypertension   . Hypothyroidism   . Lumbar  spondylosis   . Lymphedema    seeing specialist for this  . Migraine    "mostly stopped when I changed my diet" (03/03/2017)  . OSA on CPAP   . Pneumonia X 1  . PONV (postoperative nausea and vomiting)   . Restless leg syndrome   . Rheumatoid arthritis ([place])   . Stroke [facility])    TIAs "mini strokes"- unsure of last TIA - pt and daughter deny this  . Syncope   . Tremors of nervous system   . Type II diabetes mellitus (HCC)     Family History  Problem Relation Age of Onset  . Heart disease Mother   . Heart failure Mother   . Kidney disease Mother   . Lung cancer Father   . Colon cancer Brother   .  Cancer Brother        malignant thymoma  . Heart disease Brother   . Tremor Maternal Grandmother 90  . Ovarian cancer Maternal Grandmother   . Uterine cancer Maternal Grandmother   . Irritable bowel syndrome Daughter   . Esophageal cancer Neg Hx   . Stomach cancer Neg Hx   . Inflammatory bowel disease Neg Hx   . Liver disease Neg Hx   . Pancreatic cancer Neg Hx   . Rectal cancer Neg Hx     Past Surgical History:  Procedure Laterality Date  . APPENDECTOMY  1966  . BACK SURGERY    . CARDIAC CATHETERIZATION N/A 05/25/2016   Procedure: Right/Left Heart Cath and Coronary Angiography;  Surgeon: Larey Dresser, MD;  Location: [facility];  Service: Cardiovascular;  Laterality: N/A;  . CATARACT EXTRACTION W/ INTRAOCULAR LENS  IMPLANT, BILATERAL Bilateral   . COLONOSCOPY    . CORONARY ANGIOGRAM  2009   Normal coronaries  . EXCISION/RELEASE BURSA HIP Bilateral   . I & D KNEE WITH POLY EXCHANGE Left 03/02/2017   Procedure: Left knee Revision of poly liner;  Surgeon: Mcarthur Rossetti, MD;  Location: [facility];  Service: Orthopedics;  Laterality: Left;  . JOINT REPLACEMENT    . KNEE ARTHROSCOPY Bilateral   . LAPAROSCOPIC CHOLECYSTECTOMY  2015  . LMF  2017   CardiMEMS HF implant for CHF (measures amount of fluid in the heart)  . LUMBAR DISC SURGERY    . LUMBAR  LAMINECTOMY/DECOMPRESSION MICRODISCECTOMY Left 12/2005   L2-3 laminectomy and diskectomy/notes 01/06/2011  . POSTERIOR LUMBAR FUSION  04/2006   [name] 01/06/2011; "put cages in"  . REVERSE SHOULDER ARTHROPLASTY Left 06/15/2017   Procedure: REVERSE LEFT SHOULDER ARTHROPLASTY;  Surgeon: Meredith Pel, MD;  Location: [facility];  Service: Orthopedics;  Laterality: Left;  . REVERSE SHOULDER ARTHROPLASTY Right 02/01/2018  . REVERSE SHOULDER ARTHROPLASTY Right 02/01/2018   Procedure: RIGHT REVERSE SHOULDER ARTHROPLASTY;  Surgeon: Meredith Pel, MD;  Location: [facility];  Service: Orthopedics;  Laterality: Right;  . REVISION TOTAL KNEE ARTHROPLASTY Left 03/02/2017   poly liner/notes 03/02/2017  . RIGHT HEART CATH N/A 11/13/2016   Procedure: Right Heart Cath with Cardiomems;  Surgeon: Larey Dresser, MD;  Location: [facility];  Service: Cardiovascular;  Laterality: N/A;  . SHOULDER ARTHROSCOPY WITH ROTATOR CUFF REPAIR Right   . SHOULDER OPEN ROTATOR CUFF REPAIR Left 01/2011   [name] 01/29/2011  . TOTAL ABDOMINAL HYSTERECTOMY    . TOTAL KNEE ARTHROPLASTY Left   . TOTAL KNEE ARTHROPLASTY Right 11/18/2012   Procedure: TOTAL KNEE ARTHROPLASTY;  Surgeon: Yvette Rack., MD;  Location: [facility];  Service: Orthopedics;  Laterality: Right;  . TUBAL LIGATION    . UPPER GASTROINTESTINAL ENDOSCOPY    . UPPER GI ENDOSCOPY     Social History   Occupational History  . Occupation: retired  Tobacco Use  . Smoking status: Never Smoker  . Smokeless tobacco: Never Used  Substance and Sexual Activity  . Alcohol use: No    Alcohol/week: 0.0 standard drinks  . Drug use: No  . Sexual activity: Not Currently

## 2020-02-06 ENCOUNTER — Encounter: Payer: Self-pay | Admitting: Occupational Therapy

## 2020-02-06 ENCOUNTER — Ambulatory Visit: Payer: Medicare Other | Attending: Family Medicine | Admitting: Occupational Therapy

## 2020-02-06 ENCOUNTER — Other Ambulatory Visit: Payer: Self-pay

## 2020-02-06 DIAGNOSIS — I890 Lymphedema, not elsewhere classified: Secondary | ICD-10-CM | POA: Diagnosis present

## 2020-02-06 NOTE — Patient Instructions (Signed)

## 2020-02-09 ENCOUNTER — Ambulatory Visit: Payer: Medicare Other | Admitting: Occupational Therapy

## 2020-02-09 ENCOUNTER — Other Ambulatory Visit: Payer: Self-pay

## 2020-02-09 ENCOUNTER — Encounter: Payer: Self-pay | Admitting: Occupational Therapy

## 2020-02-09 DIAGNOSIS — I890 Lymphedema, not elsewhere classified: Secondary | ICD-10-CM | POA: Diagnosis not present

## 2020-02-09 NOTE — Therapy (Signed)
[place] [facility] SERVICES [address] [place], [place], [address] Phone: [phone]   Fax:  [phone]  Occupational Therapy Evaluation  Patient Details  Name: [name] MRN: [id number] Date of Birth: 05-28-1946 Referring Provider (OT): Juluis Pitch, MD   Encounter Date: 02/06/2020   OT End of Session - 02/09/20 1333    Visit Number 1    Number of Visits 36    Date for OT Re-Evaluation 05/09/20    OT Start Time 0913    OT Stop Time 1015    OT Time Calculation (min) 62 min    Activity Tolerance Patient tolerated treatment well;No increased pain    Behavior During Therapy WFL for tasks assessed/performed           Past Medical History:  Diagnosis Date   Anemia    Anxiety    Arthritis    "hands, feet" (03/03/2017)   Brain lesion    2 types   Cataract    Cervical spondylosis with myelopathy    Chest pain    Normal cardiac cath 5/09   Chronic lower back pain    CKD (chronic kidney disease), stage III    [name]   Congestive heart failure (CHF) (HCC)    has a Cardiomems implant    Constipation    Dementia ([place])    Depression    Dumping syndrome    Dyspnea    with activity   Edema    Fatigue    Fibromyalgia    GERD (gastroesophageal reflux disease)    Headache    "w/high CBG" (03/03/2017)   History of echocardiogram    a. Echo 03/20/16 (done at [facility] in [place], [place]):  mild LVH, EF 99%, normal diastolic function, mild LAE, MAC, RVSP 25 mmHg   Hyperlipidemia    Hypertension    Hypothyroidism    Lumbar spondylosis    Lymphedema    seeing specialist for this   Migraine    "mostly stopped when I changed my diet" (03/03/2017)   OSA on CPAP    Pneumonia X 1   PONV (postoperative nausea and vomiting)    Restless leg syndrome    Rheumatoid arthritis ([place])    Stroke ([place])    TIAs "mini strokes"- unsure of last TIA - pt and daughter deny this    Syncope    Tremors of nervous system    Type II diabetes mellitus ([place])     Past Surgical History:  Procedure Laterality Date   APPENDECTOMY  1966   BACK SURGERY     CARDIAC CATHETERIZATION N/A 05/25/2016   Procedure: Right/Left Heart Cath and Coronary Angiography;  Surgeon: Larey Dresser, MD;  Location: [facility];  Service: Cardiovascular;  Laterality: N/A;   CATARACT EXTRACTION W/ INTRAOCULAR LENS  IMPLANT, BILATERAL Bilateral    COLONOSCOPY     CORONARY ANGIOGRAM  2009   Normal coronaries   EXCISION/RELEASE BURSA HIP Bilateral    I & D KNEE WITH POLY EXCHANGE Left 03/02/2017   Procedure: Left knee Revision of poly liner;  Surgeon: Mcarthur Rossetti, MD;  Location: [facility];  Service: Orthopedics;  Laterality: Left;   JOINT REPLACEMENT     KNEE ARTHROSCOPY Bilateral    LAPAROSCOPIC CHOLECYSTECTOMY  2015   LMF  2017   CardiMEMS HF implant for CHF (measures amount of fluid in the heart)   LUMBAR DISC SURGERY     LUMBAR LAMINECTOMY/DECOMPRESSION MICRODISCECTOMY Left  12/2005   L2-3 laminectomy and diskectomy/notes 01/06/2011   POSTERIOR LUMBAR FUSION  04/2006   [name] 01/06/2011; "put cages in"   REVERSE SHOULDER ARTHROPLASTY Left 06/15/2017   Procedure: REVERSE LEFT SHOULDER ARTHROPLASTY;  Surgeon: Meredith Pel, MD;  Location: [facility];  Service: Orthopedics;  Laterality: Left;   REVERSE SHOULDER ARTHROPLASTY Right 02/01/2018   REVERSE SHOULDER ARTHROPLASTY Right 02/01/2018   Procedure: RIGHT REVERSE SHOULDER ARTHROPLASTY;  Surgeon: Meredith Pel, MD;  Location: [facility];  Service: Orthopedics;  Laterality: Right;   REVISION TOTAL KNEE ARTHROPLASTY Left 03/02/2017   poly liner/notes 03/02/2017   RIGHT HEART CATH N/A 11/13/2016   Procedure: Right Heart Cath with Cardiomems;  Surgeon: Larey Dresser, MD;  Location: [facility];  Service: Cardiovascular;  Laterality: N/A;   SHOULDER ARTHROSCOPY WITH ROTATOR CUFF REPAIR Right    SHOULDER  OPEN ROTATOR CUFF REPAIR Left 01/2011   [name] 01/29/2011   TOTAL ABDOMINAL HYSTERECTOMY     TOTAL KNEE ARTHROPLASTY Left    TOTAL KNEE ARTHROPLASTY Right 11/18/2012   Procedure: TOTAL KNEE ARTHROPLASTY;  Surgeon: Yvette Rack., MD;  Location: [place] [facility];  Service: Orthopedics;  Laterality: Right;   TUBAL LIGATION     UPPER GASTROINTESTINAL ENDOSCOPY     UPPER GI ENDOSCOPY      There were no vitals filed for this visit.   Subjective Assessment - 02/09/20 1337    Subjective  [name] is referred to Occupational Therapy for evaluation and treatment of BLE lymphedema by Juluis Pitch , MD. Pt reports onset of leg swelling when she was ~ 74 y o without known precipitating event. Pt endorses positive family hx of leg swelling. Pt reports leg swelling has   gotten worse over the years and no longer reduces with elevation. Pt underwent Complete Decongestive Therapy (CDT) ~ 2 years ago and reports significant limb volume reductions with MLD, short stretch compression wrapping, skin care and ther ex. Pt's stated goals are, "...to get leg swelling back down to where we got it down to during prior therapy, and to reduce leg pain and swelling so I can do more".    Pertinent History chronic BLE leg swelling and associated pain ~ 74 years; hx herniated intervertebral discL TKA; L total knee revision; HTN, Obesity. dementia; restless leg; CHF; unstable anina; R RC arthroplasty, OA, cervical spondylosis with myelopathy, chronic lower back pain, CKD (Stage?); Fibromyalgia; Hypothyroidism; OSA ( cPap?); hx pneumonia; RA, Type II Diabetes. S/P cardiac cathg, B THA, Lumbar laminectomy, posterior lumbar fusion, B shoulder arthroplasty; total abdominal hysterectomy    Limitations chronic leg swelling and associated pain, difficulty walking, impaired dynamic balance, muscle weakness,    Repetition Increases Symptoms    Special Tests + STEMMER sign base of toes bilaterally    Pain Score 5     Pain Location Leg      Pain Orientation Right;Left    Pain Descriptors / Indicators Tiring;Restless;Heaviness;Other (Comment);Squeezing;Discomfort;Tender;Tightness;Pressure   fullness   Pain Type Chronic pain    Pain Onset --   s/p 74 yrs   Pain Frequency Intermittent    Aggravating Factors  standing , walking, dependent sitting, lifting legs to transfer    Pain Relieving Factors elevation, moving my legs    Effect of Pain on Daily Activities limits functional ambulation, transfers and bed mobility, limits basic and instrumental ADLs, limits productive activities, leisure pursuits and social participation, limits body image    Multiple Pain Sites Yes  [facility] OT Assessment - 02/09/20 0001      Assessment   Medical Diagnosis moderate, stage II, 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    Prior Therapy s/p CDT 2 years. has "pumps"; existing compression stockings are inadequate and a poor fit. need replacement with custom      Precautions   Precautions --   hypothyroid   Precaution Comments Lymphedema Precautions: Cardiac, pulmonary, DM skin, hypothyroid      Restrictions   Weight Bearing Restrictions No      Balance Screen   Has the patient fallen in the past 6 months Yes    How many times? [place] expects to be discharged to: [place] Shower/Tub Other (comment)    Lives With Other (Comment)   skilled nursing     Prior Function   Level of Independence Independent    Vocation Retired    Leisure family      IADL   Prior Level of Function Shopping I    Shopping Needs to be accompanied on any shopping trip    Prior Level of Function Light Housekeeping I    Light Housekeeping Needs help with all home maintenance tasks    Prior Level of Function Meal Prep I    Meal Prep Needs to have meals prepared and served     Prior Level of Function Community Mobility I    Nurse, children's Relies on family or friends for transportation    Prior Level of Function Medication Managment I    Medication Management Is not capable of dispensing or managing own medication    Financial Management Requires assistance      Mobility   Mobility Status History of falls;Needs assist    Mobility Status Comments rolling walking      Activity Tolerance   Activity Tolerance Endurance does not limit participation in activity      Cognition   Overall Cognitive Status Within Functional Limits for tasks assessed      ROM / Strength   AROM / PROM / Strength Strength                    OT Treatments/Exercises (OP) - 02/09/20 0001      Transfers   Transfers Sit to Stand;Stand to Sit    Sit to Stand From chair/3-in-1;3: Mod assist    Stand to Sit 6: Modified independent (Device/Increase time)      ADLs   Overall ADLs impaired lymphedema self care, impaired nail and skin care, difficulty reaching feet to perform self care and ADLs,     LB Dressing difficulty fitting LB clothing and shoes 2/2 limb swelling and pain    Bathing difficulty bathing swollen feet and legs. Pt does not receive assistance with     Functional Mobility impaired    Cooking unable    Home Maintenance unable    ADL Education Given Yes      Manual Therapy   Manual Therapy Edema management         Moderate, Stage II, BLE Lipo-Lymphedema 2/2 Lipedema  Skin  Description Hyper-Keratosis Peau de Orange Shiny Tight Fibrotic Fatty Doughy Indurated    L medial thigh   mild  x x      Skin dry Flaky Erythema Macerated  x      Color Redness Present Pallor Blanching Hemosiderin Staining Other           Odor Malodorous Yeast Fungal infection  Absent      x   Temperature Warm Cool wnl    x       Pitting Edema   1+ 2+ 3+ 4+ Non-pitting        x   Girth Symmetrical Asymmetrical                   Distribution   x   Ankles to  buttocks; feet spared   Stemmer Sign Positive Negative      -   Lymphorrhea History Of:  Present Absent      x   Wounds History Of Present Absent Venous Arterial Pressure Size      X          Signs of Infection Redness Warmth Erythema Acute Swelling Drainage Borders                    Sensation Light Touch Deep pressure Hypersensitivty   Present Impaired Present Impaired Absent Impaired   x  x  x     Nails WNL Fungus Other   x  TBA  x Hair Growth Symmetrical Asymmetrical   x    Skin Creases Base of toes  Ankles   Base of Fingers Medial Thighs         Abdominal pannus Medial legs      x  x                OT Education - 02/09/20 1355    Education Details Provided Pt and family education regarding lymphatic structure and function, etiologies, onset patterns and stages of progression. Discussed  impact of obesity on lymphatic function. Outlined Complete Decongestive Therapy (CDT)  as standard of care and provided in depth information regarding 4 primary components of both Intensive and Self Management Phases, including Manual Lymph Drainage (MLD), compression wrapping and garments, skin care, and therapeutic exercise.   [name] discussion of high burden of care,  Discussed  Importance of daily, ongoing LE self-care essential to retaining clinical gains and limiting progression.  Lastly, reviewed lymphedema precautions, including cellulitis risk and difficulty with wound healing. Provided printed Lymphedema Workbook for reference.    Person(s) Educated Patient    Methods Explanation;Demonstration;Handout;Tactile cues;Verbal cues    Comprehension Verbalized understanding;Returned demonstration;Need further instruction;Verbal cues required               OT Long Term Goals - 02/09/20 1334      OT LONG TERM GOAL #1   Title Pt will be able to apply LLE multi-layer, short stretch compression wraps daily with maximum caregiver assistance, using correct gradient  techniques independently to return affected limb/s, as closely as possible, to premorbid size and shape, to limit infection risk, and to improve safe functional mobility and ADLs performance.    Baseline dependent    Time 4    Period Days    Status New    Target Date --   4th OT Rx visit     OT LONG TERM GOAL #2   Title Pt will be able to verbalize signs and symptoms of cellulitis infection and identify 4 common lymphedema precautions using printed resource for reference (modified independence) to limit LE progression over time.    Baseline Max A    Time 4    Period Days  Status New    Target Date --   4th OT Rx visit     OT LONG TERM GOAL #3   Title Pt will sustain than 85% compliance with all daily LE self-care home program components throughout Intensive Phase CDT, including impeccable skin care, lymphatic pumping therex,  compression wraps and simple self MLD, to ensure optimal limb volume reduction, to limit infection risk and to limit LE progression.    Baseline dependent    Time 12    Period Weeks    Status New    Target Date 05/09/20      OT LONG TERM GOAL #4   Title Pt to achieve at least 10% LLE limb volume reduction during Intensive Phase CDT with Max CG assistance for wrapping, to improve functional performance of basic and instrumental ADLs, and to limit LE progression.    Baseline dependent    Time 12    Period Weeks    Status New      OT LONG TERM GOAL #5   Title During self-management phase of CDT Pt will retain limb volume reductions achieved during Intensive Phase CDT with no more than 3% volume increase using all LE self care components  to limit LE progression and further functional decline.    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 Onset of BLE swelling and associated pain was in her early 13s consistent with onset pattern for Lymphedema [place]. Chronic, progressive leg swelling and associated pain limits Pts functional ambulation and mobility, and ability to perform functional activities in all occupational domains, including basic and instrumental ADLs, productive activities and leisure pursuits, social participation and role performance. Appearance of swollen legs limits her body image.  [name] will benefit from skilled Occupational Therapy for Intensive and Management Phase Complete Decongestive Therapy (CDT) to include manual lymphatic drainage, skin care, therapeutic exercise and compression therapy. Emphasis throughout OT course will also focus on Pt education for long term LE self-care. Without skilled OT for CDT LE will progress resulting in progression of the condition, increased    infection risk and further functional decline.    OT Occupational Profile and History Comprehensive Assessment- Review of records and extensive additional review of physical, cognitive, psychosocial history related to current functional performance    Occupational performance deficits (Please refer to evaluation for details): ADL's;IADL's;Rest and Sleep;Work;Leisure;Social Participation    Rehab Potential Good    Clinical Decision Making Multiple treatment options, significant modification of task necessary    Comorbidities Affecting Occupational Performance: Presence of comorbidities impacting occupational performance    Modification or Assistance to Complete Evaluation  Min-Moderate modification of tasks or assist with assess necessary to complete eval    OT Frequency 3x / week    OT Duration 12 weeks   and PRN support during Self Management Phase   OT Treatment/Interventions Self-care/ADL training;Therapeutic exercise;Functional Mobility Training;Manual Therapy;Energy conservation;Manual lymph drainage;Therapeutic  activities;Coping strategies training;DME and/or AE instruction;Compression bandaging;Patient/family education    Plan Complete Deconhgestive Therapy (CDT) Intensive and Self -Management phases, consisting of Manual lymphatic Drainage (MLD), skin care, ther ex and compression bandaging    OT Home Exercise  Plan lymphatic pumping therex 2-3 x q day, in sequence, 10 reps bilaterally    Recommended Other Services fit with BLE custom, flat knit compression garments.. Consider fitting with advanced sequential pneumatic compression device (Flexitouch) .           Patient will benefit from skilled therapeutic intervention in order to improve the following deficits and impairments:           Visit Diagnosis: Lymphedema, not elsewhere classified - Plan: Ot plan of care cert/re-cert    Problem List Patient Active Problem List   Diagnosis Date Noted   Iron deficiency 08/06/2018   History of total left knee replacement (TKR) 07/18/2018   Rectal bleeding 07/05/2018   Hemorrhoids 07/05/2018   Anal itching 07/05/2018   Rotator cuff arthropathy of right shoulder    Shoulder arthritis 06/15/2017   Status post revision of total knee replacement, left 04/29/2017   Polyethylene liner wear following left total knee arthroplasty requiring isolated polyethylene liner exchange ([place]) 03/02/2017   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[place]) 03/02/2017   Unstable angina ([place]) 05/21/2016   Chronic diastolic heart failure ([place]) 03/29/2016   Normal coronary arteries 2009 01/14/2015   Obesity-BMI 40 01/14/2015   Restless leg 01/14/2015   Chest pain 01/14/2015   Back pain 01/14/2015   Renal insufficiency 01/14/2015   Dementia- mild memory issues 01/14/2015   Occult blood positive stool 12/14/2014   Anemia 12/14/2014   Family history of colon cancer 12/14/2014   Change in bowel habits 12/14/2014   GERD (gastroesophageal reflux disease) 12/14/2014   Dyspnea 05/17/2012     Lymphedema 05/17/2012   Weight gain 05/17/2012   HTN (hypertension) 05/17/2012    [name] Spearman, MS, OTR/L, CLT-LANA 02/09/20 2:38 PM   [place] [facility] SERVICES [address] [place], [place], [address] Phone: [phone]   Fax:  [phone]  Name: [name] MRN: [id number] Date of Birth: 19-Dec-1945

## 2020-02-09 NOTE — Therapy (Signed)
[place] MAIN [facility] SERVICES [address] [place], [place], [address] Phone: [phone]   Fax:  [phone]  Occupational Therapy Treatment  Patient Details  Name: [name] MRN: [id number] Date of Birth: [birth year] Referring Provider (OT): Juluis Pitch, MD   Encounter Date: 02/09/2020   OT End of Session - 02/09/20 1458    Visit Number 2    Number of Visits 36    Date for OT Re-Evaluation 05/09/20    OT Start Time 1006    OT Stop Time 1115    OT Time Calculation (min) 69 min    Activity Tolerance Patient tolerated treatment well;No increased pain    Behavior During Therapy WFL for tasks assessed/performed           Past Medical History:  Diagnosis Date   Anemia    Anxiety    Arthritis    "hands, feet" (03/03/2017)   Brain lesion    2 types   Cataract    Cervical spondylosis with myelopathy    Chest pain    Normal cardiac cath 5/09   Chronic lower back pain    CKD (chronic kidney disease), stage III    [name]   Congestive heart failure (CHF) (HCC)    has a Cardiomems implant    Constipation    Dementia ([place])    Depression    Dumping syndrome    Dyspnea    with activity   Edema    Fatigue    Fibromyalgia    GERD (gastroesophageal reflux disease)    Headache    "w/high CBG" (03/03/2017)   History of echocardiogram    a. Echo 03/20/16 (done at [facility] in [place], [place]):  mild LVH, EF 38%, normal diastolic function, mild LAE, MAC, RVSP 25 mmHg   Hyperlipidemia    Hypertension    Hypothyroidism    Lumbar spondylosis    Lymphedema    seeing specialist for this   Migraine    "mostly stopped when I changed my diet" (03/03/2017)   OSA on CPAP    Pneumonia X 1   PONV (postoperative nausea and vomiting)    Restless leg syndrome    Rheumatoid arthritis ([place])    Stroke ([place])    TIAs "mini strokes"- unsure of last TIA - pt and daughter deny this    Syncope    Tremors of nervous system    Type II diabetes mellitus ([place])     Past Surgical History:  Procedure Laterality Date   APPENDECTOMY  1966   BACK SURGERY     CARDIAC CATHETERIZATION N/A 05/25/2016   Procedure: Right/Left Heart Cath and Coronary Angiography;  Surgeon: Larey Dresser, MD;  Location: [place] [facility];  Service: Cardiovascular;  Laterality: N/A;   CATARACT EXTRACTION W/ INTRAOCULAR LENS  IMPLANT, BILATERAL Bilateral    COLONOSCOPY     CORONARY ANGIOGRAM  2009   Normal coronaries   EXCISION/RELEASE BURSA HIP Bilateral    I & D KNEE WITH POLY EXCHANGE Left 03/02/2017   Procedure: Left knee Revision of poly liner;  Surgeon: Mcarthur Rossetti, MD;  Location: [place];  Service: Orthopedics;  Laterality: Left;   JOINT REPLACEMENT     KNEE ARTHROSCOPY Bilateral    LAPAROSCOPIC CHOLECYSTECTOMY  2015   LMF  2017   CardiMEMS HF implant for CHF (measures amount of fluid in the heart)   LUMBAR DISC SURGERY     LUMBAR LAMINECTOMY/DECOMPRESSION MICRODISCECTOMY Left  12/2005   L2-3 laminectomy and diskectomy/notes 01/06/2011   POSTERIOR LUMBAR FUSION  04/2006   [name] 01/06/2011; "put cages in"   REVERSE SHOULDER ARTHROPLASTY Left 06/15/2017   Procedure: REVERSE LEFT SHOULDER ARTHROPLASTY;  Surgeon: Meredith Pel, MD;  Location: [place];  Service: Orthopedics;  Laterality: Left;   REVERSE SHOULDER ARTHROPLASTY Right 02/01/2018   REVERSE SHOULDER ARTHROPLASTY Right 02/01/2018   Procedure: RIGHT REVERSE SHOULDER ARTHROPLASTY;  Surgeon: Meredith Pel, MD;  Location: [place];  Service: Orthopedics;  Laterality: Right;   REVISION TOTAL KNEE ARTHROPLASTY Left 03/02/2017   poly liner/notes 03/02/2017   RIGHT HEART CATH N/A 11/13/2016   Procedure: Right Heart Cath with Cardiomems;  Surgeon: Larey Dresser, MD;  Location: [place] [facility];  Service: Cardiovascular;  Laterality: N/A;   SHOULDER ARTHROSCOPY WITH ROTATOR CUFF REPAIR Right    SHOULDER  OPEN ROTATOR CUFF REPAIR Left 01/2011   [name] 01/29/2011   TOTAL ABDOMINAL HYSTERECTOMY     TOTAL KNEE ARTHROPLASTY Left    TOTAL KNEE ARTHROPLASTY Right 11/18/2012   Procedure: TOTAL KNEE ARTHROPLASTY;  Surgeon: Yvette Rack., MD;  Location: [place];  Service: Orthopedics;  Laterality: Right;   TUBAL LIGATION     UPPER GASTROINTESTINAL ENDOSCOPY     UPPER GI ENDOSCOPY      There were no vitals filed for this visit.   Subjective Assessment - 02/09/20 1439    Subjective  Mrs Howk presents to OT for Rx visit 2 to address BLE lymphedema. Pt has no new complaints. Pt is unaccompanied today.    Pertinent History chronic BLE leg swelling and associated pain ~ 40 years; hx herniated intervertebral discL TKA; L total knee revision; HTN, Obesity. dementia; restless leg; CHF; unstable anina; R RC arthroplasty, OA, cervical spondylosis with myelopathy, chronic lower back pain, CKD (Stage?); Fibromyalgia; Hypothyroidism; OSA ( cPap?); hx pneumonia; RA, Type II Diabetes. S/P cardiac cathg, B THA, Lumbar laminectomy, posterior lumbar fusion, B shoulder arthroplasty; total abdominal hysterectomy    Limitations chronic leg swelling and associated pain, difficulty walking, impaired dynamic balance, muscle weakness,    Repetition Increases Symptoms    Special Tests + STEMMER sign base of toes bilaterally    Pain Onset --   s/p 45 yrs              LYMPHEDEMA/ONCOLOGY QUESTIONNAIRE - 02/09/20 0001      Lymphedema Assessments   Lymphedema Assessments Lower extremities      Right Lower Extremity Lymphedema   Other RLE A-D= 9715.32 ml. RLE E-G = 10466.93 ml. RLE A-G= 20182.26 ml    Other Limb volume differential measures 3.54%, L>R, confirming Limbs are symetrically swollen from ankles to groin. Feet are spared.      Left Lower Extremity Lymphedema   Other LLE A-D= 9644.53 ml. LLE E-G= 9823.85 ml. LLE A-G =  19468.39 ml.                    OT Treatments/Exercises (OP) - 02/09/20 1441       Transfers   Transfers --    Sit to Stand --    Stand to Sit --      ADLs   ADL Education Given Yes      Manual Therapy   Manual Therapy Edema management;Compression Bandaging    Edema Management BLE comparative limb volumetrics    Compression Bandaging LLE gradient compression wraps using short stretch bandages. No foot wraps.  OT Education - 02/09/20 1445    Education Details Continued skilled Pt/caregiver education  And LE ADL training throughout visit for lymphedema self care/ home program, including compression wrapping, compression garment and device wear/care, lymphatic pumping ther ex, simple self-MLD, and skin care. Discussed progress towards goals.    Person(s) Educated Patient    Methods Explanation;Demonstration;Handout;Tactile cues;Verbal cues    Comprehension Verbalized understanding;Returned demonstration;Need further instruction;Verbal cues required               OT Long Term Goals - 02/09/20 1334      OT LONG TERM GOAL #1   Title Pt will be able to apply LLE multi-layer, short stretch compression wraps daily with maximum caregiver assistance, using correct gradient techniques independently to return affected limb/s, as closely as possible, to premorbid size and shape, to limit infection risk, and to improve safe functional mobility and ADLs performance.    Baseline dependent    Time 4    Period Days    Status New    Target Date --   4th OT Rx visit     OT LONG TERM GOAL #2   Title Pt will be able to verbalize signs and symptoms of cellulitis infection and identify 4 common lymphedema precautions using printed resource for reference (modified independence) to limit LE progression over time.    Baseline Max A    Time 4    Period Days    Status New    Target Date --   4th OT Rx visit     OT LONG TERM GOAL #3   Title Pt will sustain than 85% compliance with all daily LE self-care home program components throughout Intensive  Phase CDT, including impeccable skin care, lymphatic pumping therex,  compression wraps and simple self MLD, to ensure optimal limb volume reduction, to limit infection risk and to limit LE progression.    Baseline dependent    Time 12    Period Weeks    Status New    Target Date 05/09/20      OT LONG TERM GOAL #4   Title Pt to achieve at least 10% LLE limb volume reduction during Intensive Phase CDT with Max CG assistance for wrapping, to improve functional performance of basic and instrumental ADLs, and to limit LE progression.    Baseline dependent    Time 12    Period Weeks    Status New      OT LONG TERM GOAL #5   Title During self-management phase of CDT Pt will retain limb volume reductions achieved during Intensive Phase CDT with no more than 3% volume increase using all LE self care components  to limit LE progression and further functional decline.    Baseline dependent    Time 6    Period Months    Status New    Target Date 08/07/20                  Patient will benefit from skilled therapeutic intervention in order to improve the following deficits and impairments:           Visit Diagnosis: Lymphedema, not elsewhere classified    Problem List Patient Active Problem List   Diagnosis Date Noted   Iron deficiency 08/06/2018   History of total left knee replacement (TKR) 07/18/2018   Rectal bleeding 07/05/2018   Hemorrhoids 07/05/2018   Anal itching 07/05/2018   Rotator cuff arthropathy of right shoulder    Shoulder arthritis 06/15/2017  Status post revision of total knee replacement, left 04/29/2017   Polyethylene liner wear following left total knee arthroplasty requiring isolated polyethylene liner exchange (HCC) 03/02/2017   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[place]) 03/02/2017   Unstable angina (HCC) 05/21/2016   Chronic diastolic heart failure ([place]) 03/29/2016   Normal coronary arteries 2009 01/14/2015   Obesity-BMI  40 01/14/2015   Restless leg 01/14/2015   Chest pain 01/14/2015   Back pain 01/14/2015   Renal insufficiency 01/14/2015   Dementia- mild memory issues 01/14/2015   Occult blood positive stool 12/14/2014   Anemia 12/14/2014   Family history of colon cancer 12/14/2014   Change in bowel habits 12/14/2014   GERD (gastroesophageal reflux disease) 12/14/2014   Dyspnea 05/17/2012   Lymphedema 05/17/2012   Weight gain 05/17/2012   HTN (hypertension) 05/17/2012    Andrey Spearman, MS, OTR/L, CLT-LANA 02/09/20 3:00 PM  [place] MAIN [facility] SERVICES [address] [place], [place], [address] Phone: [phone]   Fax:  [phone]  Name: [name] MRN: [id number] Date of Birth: 06-25-1946

## 2020-02-13 ENCOUNTER — Other Ambulatory Visit: Payer: Self-pay

## 2020-02-13 ENCOUNTER — Ambulatory Visit: Payer: Medicare Other | Admitting: Occupational Therapy

## 2020-02-13 DIAGNOSIS — I890 Lymphedema, not elsewhere classified: Secondary | ICD-10-CM

## 2020-02-13 NOTE — Therapy (Signed)
[place] MAIN [facility] SERVICES [address] [place], [place], [address] Phone: [phone]   Fax:  [phone]  Occupational Therapy Treatment  Patient Details  Name: [name] MRN: [id number] Date of Birth: [birth year] Referring Provider (OT): Juluis Pitch, MD   Encounter Date: 02/13/2020   OT End of Session - 02/13/20 1524    Visit Number 3    Number of Visits 36    Date for OT Re-Evaluation 05/09/20    OT Start Time 0913    OT Stop Time 1013    OT Time Calculation (min) 60 min    Activity Tolerance Patient tolerated treatment well;No increased pain    Behavior During Therapy WFL for tasks assessed/performed           Past Medical History:  Diagnosis Date  . Anemia   . Anxiety   . Arthritis    "hands, feet" (03/03/2017)  . Brain lesion    2 types  . Cataract   . Cervical spondylosis with myelopathy   . Chest pain    Normal cardiac cath 5/09  . Chronic lower back pain   . CKD (chronic kidney disease), stage III    [name]  . Congestive heart failure (CHF) ([place])    has a Cardiomems implant   . Constipation   . Dementia ([place])   . Depression   . Dumping syndrome   . Dyspnea    with activity  . Edema   . Fatigue   . Fibromyalgia   . GERD (gastroesophageal reflux disease)   . Headache    "w/high CBG" (03/03/2017)  . History of echocardiogram    a. Echo 03/20/16 (done at [facility] in [place], [place]):  mild LVH, EF 51%, normal diastolic function, mild LAE, MAC, RVSP 25 mmHg  . Hyperlipidemia   . Hypertension   . Hypothyroidism   . Lumbar spondylosis   . Lymphedema    seeing specialist for this  . Migraine    "mostly stopped when I changed my diet" (03/03/2017)  . OSA on CPAP   . Pneumonia X 1  . PONV (postoperative nausea and vomiting)   . Restless leg syndrome   . Rheumatoid arthritis ([place])   . Stroke [facility])    TIAs "mini strokes"- unsure of last TIA - pt and daughter deny this  .  Syncope   . Tremors of nervous system   . Type II diabetes mellitus ([place])     Past Surgical History:  Procedure Laterality Date  . APPENDECTOMY  1966  . BACK SURGERY    . CARDIAC CATHETERIZATION N/A 05/25/2016   Procedure: Right/Left Heart Cath and Coronary Angiography;  Surgeon: Larey Dresser, MD;  Location: [place] [facility];  Service: Cardiovascular;  Laterality: N/A;  . CATARACT EXTRACTION W/ INTRAOCULAR LENS  IMPLANT, BILATERAL Bilateral   . COLONOSCOPY    . CORONARY ANGIOGRAM  2009   Normal coronaries  . EXCISION/RELEASE BURSA HIP Bilateral   . I & D KNEE WITH POLY EXCHANGE Left 03/02/2017   Procedure: Left knee Revision of poly liner;  Surgeon: Mcarthur Rossetti, MD;  Location: [place];  Service: Orthopedics;  Laterality: Left;  . JOINT REPLACEMENT    . KNEE ARTHROSCOPY Bilateral   . LAPAROSCOPIC CHOLECYSTECTOMY  2015  . LMF  2017   CardiMEMS HF implant for CHF (measures amount of fluid in the heart)  . LUMBAR DISC SURGERY    . LUMBAR LAMINECTOMY/DECOMPRESSION MICRODISCECTOMY Left  12/2005   L2-3 laminectomy and diskectomy/notes 01/06/2011  . POSTERIOR LUMBAR FUSION  04/2006   [name] 01/06/2011; "put cages in"  . REVERSE SHOULDER ARTHROPLASTY Left 06/15/2017   Procedure: REVERSE LEFT SHOULDER ARTHROPLASTY;  Surgeon: Meredith Pel, MD;  Location: [place];  Service: Orthopedics;  Laterality: Left;  . REVERSE SHOULDER ARTHROPLASTY Right 02/01/2018  . REVERSE SHOULDER ARTHROPLASTY Right 02/01/2018   Procedure: RIGHT REVERSE SHOULDER ARTHROPLASTY;  Surgeon: Meredith Pel, MD;  Location: [place];  Service: Orthopedics;  Laterality: Right;  . REVISION TOTAL KNEE ARTHROPLASTY Left 03/02/2017   poly liner/notes 03/02/2017  . RIGHT HEART CATH N/A 11/13/2016   Procedure: Right Heart Cath with Cardiomems;  Surgeon: Larey Dresser, MD;  Location: [place] [facility];  Service: Cardiovascular;  Laterality: N/A;  . SHOULDER ARTHROSCOPY WITH ROTATOR CUFF REPAIR Right   . SHOULDER  OPEN ROTATOR CUFF REPAIR Left 01/2011   [name] 01/29/2011  . TOTAL ABDOMINAL HYSTERECTOMY    . TOTAL KNEE ARTHROPLASTY Left   . TOTAL KNEE ARTHROPLASTY Right 11/18/2012   Procedure: TOTAL KNEE ARTHROPLASTY;  Surgeon: Yvette Rack., MD;  Location: [place];  Service: Orthopedics;  Laterality: Right;  . TUBAL LIGATION    . UPPER GASTROINTESTINAL ENDOSCOPY    . UPPER GI ENDOSCOPY      There were no vitals filed for this visit.   Subjective Assessment - 02/13/20 0926    Subjective  [name] presents to OT for Rx visit 3 to address BLE lymphedema. Pt has no new complaints. Pt is unaccompanied today. Pt c/o tightness and heaviness in legs this morning 5/10.    Pertinent History chronic BLE leg swelling and associated pain ~ 40 years; hx herniated intervertebral discL TKA; L total knee revision; HTN, Obesity. dementia; restless leg; CHF; unstable anina; R RC arthroplasty, OA, cervical spondylosis with myelopathy, chronic lower back pain, CKD (Stage?); Fibromyalgia; Hypothyroidism; OSA ( cPap?); hx pneumonia; RA, Type II Diabetes. S/P cardiac cathg, B THA, Lumbar laminectomy, posterior lumbar fusion, B shoulder arthroplasty; total abdominal hysterectomy    Limitations chronic leg swelling and associated pain, difficulty walking, impaired dynamic balance, muscle weakness,    Repetition Increases Symptoms    Special Tests + STEMMER sign base of toes bilaterally    Currently in Pain? Yes    Pain Score 5     Pain Location Leg    Pain Descriptors / Indicators Tightness;Tiring;Pressure;Heaviness    Pain Type Chronic pain    Pain Onset --   s/p 44 yrs                       OT Treatments/Exercises (OP) - 02/13/20 0001      ADLs   ADL Education Given Yes (P)       Manual Therapy   Manual Therapy Edema management;Compression Bandaging;Manual Lymphatic Drainage (MLD) (P)     Edema Management -- (P)     Compression Bandaging LLE gradient compression wraps using short stretch bandages. No  foot wraps. (P)                   OT Education - 02/13/20 1523    Education Details Intro level edu for lymphatic structure and function relative to MLD  and ther ex sequencing    Person(s) Educated Patient    Methods Explanation;Demonstration    Comprehension Verbalized understanding;Returned demonstration;Need further instruction               OT Long Term  Goals - 02/09/20 1334      OT LONG TERM GOAL #1   Title Pt will be able to apply LLE multi-layer, short stretch compression wraps daily with maximum caregiver assistance, using correct gradient techniques independently to return affected limb/s, as closely as possible, to premorbid size and shape, to limit infection risk, and to improve safe functional mobility and ADLs performance.    Baseline dependent    Time 4    Period Days    Status New    Target Date --   4th OT Rx visit     OT LONG TERM GOAL #2   Title Pt will be able to verbalize signs and symptoms of cellulitis infection and identify 4 common lymphedema precautions using printed resource for reference (modified independence) to limit LE progression over time.    Baseline Max A    Time 4    Period Days    Status New    Target Date --   4th OT Rx visit     OT LONG TERM GOAL #3   Title Pt will sustain than 85% compliance with all daily LE self-care home program components throughout Intensive Phase CDT, including impeccable skin care, lymphatic pumping therex,  compression wraps and simple self MLD, to ensure optimal limb volume reduction, to limit infection risk and to limit LE progression.    Baseline dependent    Time 12    Period Weeks    Status New    Target Date 05/09/20      OT LONG TERM GOAL #4   Title Pt to achieve at least 10% LLE limb volume reduction during Intensive Phase CDT with Max CG assistance for wrapping, to improve functional performance of basic and instrumental ADLs, and to limit LE progression.    Baseline dependent    Time 12      Period Weeks    Status New      OT LONG TERM GOAL #5   Title During self-management phase of CDT Pt will retain limb volume reductions achieved during Intensive Phase CDT with no more than 3% volume increase using all LE self care components  to limit LE progression and further functional decline.    Baseline dependent    Time 6    Period Months    Status New    Target Date 08/07/20                 Plan - 02/13/20 1525    Clinical Impression Statement Commenced teaching lymphatic structure and function relative to MLD and lymphatic  pumping therex while providing MLD to LLE/LLQ. Pt able to perform diaphragmatic breathing using correct techniqes to stimulate thorACIC DUCT AND DEEP ABDOMINAL PATHWAYS with min A by end of session. Knee length wraps applied today w addition of 4th short stretch , 15 cm wide bandage to achieve compression   from foot to popliteal fossa and politeal LN,.Good tolerance for OT. No increased SOB throughout session. Transfers  from wc to/from Rx bed are very slow but no LOB is observed. Cont as per POC.    OT Occupational Profile and History Comprehensive Assessment- Review of records and extensive additional review of physical, cognitive, psychosocial history related to current functional performance    Occupational performance deficits (Please refer to evaluation for details): ADL's;IADL's;Rest and Sleep;Work;Leisure;Social Participation    Rehab Potential Good    Clinical Decision Making Multiple treatment options, significant modification of task necessary    Comorbidities Affecting Occupational Performance: Presence  of comorbidities impacting occupational performance    Modification or Assistance to Complete Evaluation  Min-Moderate modification of tasks or assist with assess necessary to complete eval    OT Frequency 3x / week    OT Duration 12 weeks   and PRN support during Self Management Phase   OT Treatment/Interventions Self-care/ADL  training;Therapeutic exercise;Functional Mobility Training;Manual Therapy;Energy conservation;Manual lymph drainage;Therapeutic activities;Coping strategies training;DME and/or AE instruction;Compression bandaging;Patient/family education    Plan Complete Deconhgestive Therapy (CDT) Intensive and Self -Management phases, consisting of Manual lymphatic Drainage (MLD), skin care, ther ex and compression bandaging    OT Home Exercise Plan lymphatic pumping therex 2-3 x q day, in sequence, 10 reps bilaterally    Recommended Other Services fit with BLE custom, flat knit compression garments.. Consider fitting with advanced sequential pneumatic compression device (Flexitouch) .           Patient will benefit from skilled therapeutic intervention in order to improve the following deficits and impairments:           Visit Diagnosis: Lymphedema, not elsewhere classified    Problem List Patient Active Problem List   Diagnosis Date Noted  . Iron deficiency 08/06/2018  . History of total left knee replacement (TKR) 07/18/2018  . Rectal bleeding 07/05/2018  . Hemorrhoids 07/05/2018  . Anal itching 07/05/2018  . Rotator cuff arthropathy of right shoulder   . Shoulder arthritis 06/15/2017  . Status post revision of total knee replacement, left 04/29/2017  . Polyethylene liner wear following left total knee arthroplasty requiring isolated polyethylene liner exchange ([place]) 03/02/2017  . 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 Unstable angina ([place]) 05/21/2016  . Chronic diastolic heart failure ([place]) 03/29/2016  . Normal coronary arteries 2009 01/14/2015  . Obesity-BMI 40 01/14/2015  . Restless leg 01/14/2015  . Chest pain 01/14/2015  . Back pain 01/14/2015  . Renal insufficiency 01/14/2015  . Dementia- mild memory issues 01/14/2015  . Occult blood positive stool 12/14/2014  . Anemia 12/14/2014  . Family history of colon cancer 12/14/2014  . Change in bowel  habits 12/14/2014  . GERD (gastroesophageal reflux disease) 12/14/2014  . Dyspnea 05/17/2012  . Lymphedema 05/17/2012  . Weight gain 05/17/2012  . HTN (hypertension) 05/17/2012    Andrey Spearman, MS, OTR/L, [facility] 02/13/20 3:30 PM  [place] MAIN [facility] SERVICES [address] [place], [place], [address] Phone: [phone]   Fax:  [phone]  Name: [name] MRN: [id number] Date of Birth: [birth year]

## 2020-02-14 ENCOUNTER — Other Ambulatory Visit: Payer: Self-pay

## 2020-02-14 ENCOUNTER — Ambulatory Visit: Payer: Medicare Other | Admitting: Occupational Therapy

## 2020-02-14 DIAGNOSIS — I890 Lymphedema, not elsewhere classified: Secondary | ICD-10-CM

## 2020-02-14 NOTE — Therapy (Signed)
[place] MAIN [facility] SERVICES [address] [place], [place], [address] Phone: [phone]   Fax:  [phone]  Occupational Therapy Treatment  Patient Details  Name: [name] MRN: [id number] Date of Birth: 1945-09-22 Referring Provider (OT): Juluis Pitch, MD   Encounter Date: 02/14/2020   OT End of Session - 02/14/20 1732    Visit Number 4    Number of Visits 36    Date for OT Re-Evaluation 05/09/20    OT Start Time 0905    OT Stop Time 1020    OT Time Calculation (min) 75 min    Activity Tolerance Patient tolerated treatment well;No increased pain    Behavior During Therapy WFL for tasks assessed/performed           Past Medical History:  Diagnosis Date  . Anemia   . Anxiety   . Arthritis    "hands, feet" (03/03/2017)  . Brain lesion    2 types  . Cataract   . Cervical spondylosis with myelopathy   . Chest pain    Normal cardiac cath 5/09  . Chronic lower back pain   . CKD (chronic kidney disease), stage III    [name]  . Congestive heart failure (CHF) ([place])    has a Cardiomems implant   . Constipation   . Dementia ([place])   . Depression   . Dumping syndrome   . Dyspnea    with activity  . Edema   . Fatigue   . Fibromyalgia   . GERD (gastroesophageal reflux disease)   . Headache    "w/high CBG" (03/03/2017)  . History of echocardiogram    a. Echo 03/20/16 (done at [facility] in [place], [place]):  mild LVH, EF 24%, normal diastolic function, mild LAE, MAC, RVSP 25 mmHg  . Hyperlipidemia   . Hypertension   . Hypothyroidism   . Lumbar spondylosis   . Lymphedema    seeing specialist for this  . Migraine    "mostly stopped when I changed my diet" (03/03/2017)  . OSA on CPAP   . Pneumonia X 1  . PONV (postoperative nausea and vomiting)   . Restless leg syndrome   . Rheumatoid arthritis ([place])   . Stroke [facility])    TIAs "mini strokes"- unsure of last TIA - pt and daughter deny this  .  Syncope   . Tremors of nervous system   . Type II diabetes mellitus ([place])     Past Surgical History:  Procedure Laterality Date  . APPENDECTOMY  1966  . BACK SURGERY    . CARDIAC CATHETERIZATION N/A 05/25/2016   Procedure: Right/Left Heart Cath and Coronary Angiography;  Surgeon: Larey Dresser, MD;  Location: [place] CV LAB;  Service: Cardiovascular;  Laterality: N/A;  . CATARACT EXTRACTION W/ INTRAOCULAR LENS  IMPLANT, BILATERAL Bilateral   . COLONOSCOPY    . CORONARY ANGIOGRAM  2009   Normal coronaries  . EXCISION/RELEASE BURSA HIP Bilateral   . I & D KNEE WITH POLY EXCHANGE Left 03/02/2017   Procedure: Left knee Revision of poly liner;  Surgeon: Mcarthur Rossetti, MD;  Location: [place];  Service: Orthopedics;  Laterality: Left;  . JOINT REPLACEMENT    . KNEE ARTHROSCOPY Bilateral   . LAPAROSCOPIC CHOLECYSTECTOMY  2015  . LMF  2017   CardiMEMS HF implant for CHF (measures amount of fluid in the heart)  . LUMBAR DISC SURGERY    . LUMBAR LAMINECTOMY/DECOMPRESSION MICRODISCECTOMY Left  12/2005   L2-3 laminectomy and diskectomy/notes 01/06/2011  . POSTERIOR LUMBAR FUSION  04/2006   [name] 01/06/2011; "put cages in"  . REVERSE SHOULDER ARTHROPLASTY Left 06/15/2017   Procedure: REVERSE LEFT SHOULDER ARTHROPLASTY;  Surgeon: Meredith Pel, MD;  Location: [place];  Service: Orthopedics;  Laterality: Left;  . REVERSE SHOULDER ARTHROPLASTY Right 02/01/2018  . REVERSE SHOULDER ARTHROPLASTY Right 02/01/2018   Procedure: RIGHT REVERSE SHOULDER ARTHROPLASTY;  Surgeon: Meredith Pel, MD;  Location: [place];  Service: Orthopedics;  Laterality: Right;  . REVISION TOTAL KNEE ARTHROPLASTY Left 03/02/2017   poly liner/notes 03/02/2017  . RIGHT HEART CATH N/A 11/13/2016   Procedure: Right Heart Cath with Cardiomems;  Surgeon: Larey Dresser, MD;  Location: [place] CV LAB;  Service: Cardiovascular;  Laterality: N/A;  . SHOULDER ARTHROSCOPY WITH ROTATOR CUFF REPAIR Right   . SHOULDER  OPEN ROTATOR CUFF REPAIR Left 01/2011   [name] 01/29/2011  . TOTAL ABDOMINAL HYSTERECTOMY    . TOTAL KNEE ARTHROPLASTY Left   . TOTAL KNEE ARTHROPLASTY Right 11/18/2012   Procedure: TOTAL KNEE ARTHROPLASTY;  Surgeon: Yvette Rack., MD;  Location: [place];  Service: Orthopedics;  Laterality: Right;  . TUBAL LIGATION    . UPPER GASTROINTESTINAL ENDOSCOPY    . UPPER GI ENDOSCOPY      There were no vitals filed for this visit.   Subjective Assessment - 02/14/20 1727    Subjective  [name] presents to OT for Rx visit 4 to address BLE lymphedema. Pt has no new complaints. Pt is unaccompanied today. Pt reports she was able to tolerate knee length compression wraps applied last session for perscribed 23 hours after application. Pt reports 5/10 knee pain this morning.    Pertinent History chronic BLE leg swelling and associated pain ~ 40 years; hx herniated intervertebral discL TKA; L total knee revision; HTN, Obesity. dementia; restless leg; CHF; unstable anina; R RC arthroplasty, OA, cervical spondylosis with myelopathy, chronic lower back pain, CKD (Stage?); Fibromyalgia; Hypothyroidism; OSA ( cPap?); hx pneumonia; RA, Type II Diabetes. S/P cardiac cathg, B THA, Lumbar laminectomy, posterior lumbar fusion, B shoulder arthroplasty; total abdominal hysterectomy    Limitations chronic leg swelling and associated pain, difficulty walking, impaired dynamic balance, muscle weakness,    Repetition Increases Symptoms    Special Tests + STEMMER sign base of toes bilaterally    Pain Onset --   s/p 45 yrs                       OT Treatments/Exercises (OP) - 02/14/20 0001      ADLs   ADL Education Given Yes      Manual Therapy   Manual Therapy Edema management;Compression Bandaging;Manual Lymphatic Drainage (MLD)    Manual Lymphatic Drainage (MLD) Commenced LLE MLD to LLE/LLQ utilizing modified "short neck sequence" with no strokes to neck in keeping w' LE thyroid precautions. UTILIZED DEEP  ABDOMINAL PATHWAYS VIS DIAPHRAGMATIC BREATHING, FUNCTIONAL INGUINAL ln, AND PROGIDED DYNAMIC STROKES TO LEG IN SEGMENTS FROM PROXIMAL TO DISTAL. eXCELLENT TOLERANCE.    Compression Bandaging LLE gradient compression wraps using short stretch bandages. No foot wraps.                  OT Education - 02/14/20 1732    Education Details Continued skilled Pt/caregiver education  And LE ADL training throughout visit for simple self MLD    Person(s) Educated Patient    Methods Explanation;Demonstration;Handout;Tactile cues;Verbal cues    Comprehension Verbalized  understanding;Returned demonstration;Need further instruction;Verbal cues required               OT Long Term Goals - 02/09/20 1334      OT LONG TERM GOAL #1   Title Pt will be able to apply LLE multi-layer, short stretch compression wraps daily with maximum caregiver assistance, using correct gradient techniques independently to return affected limb/s, as closely as possible, to premorbid size and shape, to limit infection risk, and to improve safe functional mobility and ADLs performance.    Baseline dependent    Time 4    Period Days    Status New    Target Date --   4th OT Rx visit     OT LONG TERM GOAL #2   Title Pt will be able to verbalize signs and symptoms of cellulitis infection and identify 4 common lymphedema precautions using printed resource for reference (modified independence) to limit LE progression over time.    Baseline Max A    Time 4    Period Days    Status New    Target Date --   4th OT Rx visit     OT LONG TERM GOAL #3   Title Pt will sustain than 85% compliance with all daily LE self-care home program components throughout Intensive Phase CDT, including impeccable skin care, lymphatic pumping therex,  compression wraps and simple self MLD, to ensure optimal limb volume reduction, to limit infection risk and to limit LE progression.    Baseline dependent    Time 12    Period Weeks    Status New     Target Date 05/09/20      OT LONG TERM GOAL #4   Title Pt to achieve at least 10% LLE limb volume reduction during Intensive Phase CDT with Max CG assistance for wrapping, to improve functional performance of basic and instrumental ADLs, and to limit LE progression.    Baseline dependent    Time 12    Period Weeks    Status New      OT LONG TERM GOAL #5   Title During self-management phase of CDT Pt will retain limb volume reductions achieved during Intensive Phase CDT with no more than 3% volume increase using all LE self care components  to limit LE progression and further functional decline.    Baseline dependent    Time 6    Period Months    Status New    Target Date 08/07/20                  Patient will benefit from skilled therapeutic intervention in order to improve the following deficits and impairments:           Visit Diagnosis: Lymphedema, not elsewhere classified    Problem List Patient Active Problem List   Diagnosis Date Noted  . Iron deficiency 08/06/2018  . History of total left knee replacement (TKR) 07/18/2018  . Rectal bleeding 07/05/2018  . Hemorrhoids 07/05/2018  . Anal itching 07/05/2018  . Rotator cuff arthropathy of right shoulder   . Shoulder arthritis 06/15/2017  . Status post revision of total knee replacement, left 04/29/2017  . Polyethylene liner wear following left total knee arthroplasty requiring isolated polyethylene liner exchange ([place]) 03/02/2017  . 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 Unstable angina ([place]) 05/21/2016  . Chronic diastolic heart failure ([place]) 03/29/2016  . Normal coronary arteries 2009 01/14/2015  . Obesity-BMI 40 01/14/2015  . Restless  leg 01/14/2015  . Chest pain 01/14/2015  . Back pain 01/14/2015  . Renal insufficiency 01/14/2015  . Dementia- mild memory issues 01/14/2015  . Occult blood positive stool 12/14/2014  . Anemia 12/14/2014  . Family history of colon  cancer 12/14/2014  . Change in bowel habits 12/14/2014  . GERD (gastroesophageal reflux disease) 12/14/2014  . Dyspnea 05/17/2012  . Lymphedema 05/17/2012  . Weight gain 05/17/2012  . HTN (hypertension) 05/17/2012    Andrey Spearman, MS, OTR/L, [facility] 02/14/20 5:34 PM   [place] MAIN [facility] SERVICES [address] [place], [place], [address] Phone: [phone]   Fax:  [phone]  Name: ROSE-MARIE HICKLING MRN: [id number] Date of Birth: 13-Jun-1946

## 2020-02-16 ENCOUNTER — Other Ambulatory Visit: Payer: Self-pay

## 2020-02-16 ENCOUNTER — Ambulatory Visit: Payer: Medicare Other | Admitting: Occupational Therapy

## 2020-02-16 DIAGNOSIS — I890 Lymphedema, not elsewhere classified: Secondary | ICD-10-CM | POA: Diagnosis not present

## 2020-02-16 NOTE — Therapy (Signed)
[place] MAIN [facility] SERVICES [address] [place], [place], [address] Phone: [phone]   Fax:  [phone]  Occupational Therapy Treatment  Patient Details  Name: [name] MRN: [id number] Date of Birth: 04/13/1946 Referring Provider (OT): Juluis Pitch, MD   Encounter Date: 02/16/2020   OT End of Session - 02/16/20 1132    Visit Number 5    Number of Visits 36    Date for OT Re-Evaluation 05/09/20    OT Start Time 1010    OT Stop Time 1130    OT Time Calculation (min) 80 min    Activity Tolerance Patient tolerated treatment well;No increased pain    Behavior During Therapy WFL for tasks assessed/performed           Past Medical History:  Diagnosis Date  . Anemia   . Anxiety   . Arthritis    "hands, feet" (03/03/2017)  . Brain lesion    2 types  . Cataract   . Cervical spondylosis with myelopathy   . Chest pain    Normal cardiac cath 5/09  . Chronic lower back pain   . CKD (chronic kidney disease), stage III    [name]  . Congestive heart failure (CHF) ([place])    has a Cardiomems implant   . Constipation   . Dementia ([place])   . Depression   . Dumping syndrome   . Dyspnea    with activity  . Edema   . Fatigue   . Fibromyalgia   . GERD (gastroesophageal reflux disease)   . Headache    "w/high CBG" (03/03/2017)  . History of echocardiogram    a. Echo 03/20/16 (done at [facility] in [place], [place]):  mild LVH, EF 32%, normal diastolic function, mild LAE, MAC, RVSP 25 mmHg  . Hyperlipidemia   . Hypertension   . Hypothyroidism   . Lumbar spondylosis   . Lymphedema    seeing specialist for this  . Migraine    "mostly stopped when I changed my diet" (03/03/2017)  . OSA on CPAP   . Pneumonia X 1  . PONV (postoperative nausea and vomiting)   . Restless leg syndrome   . Rheumatoid arthritis ([place])   . Stroke [facility])    TIAs "mini strokes"- unsure of last TIA - pt and daughter deny this  .  Syncope   . Tremors of nervous system   . Type II diabetes mellitus ([place])     Past Surgical History:  Procedure Laterality Date  . APPENDECTOMY  1966  . BACK SURGERY    . CARDIAC CATHETERIZATION N/A 05/25/2016   Procedure: Right/Left Heart Cath and Coronary Angiography;  Surgeon: Larey Dresser, MD;  Location: [place] CV LAB;  Service: Cardiovascular;  Laterality: N/A;  . CATARACT EXTRACTION W/ INTRAOCULAR LENS  IMPLANT, BILATERAL Bilateral   . COLONOSCOPY    . CORONARY ANGIOGRAM  2009   Normal coronaries  . EXCISION/RELEASE BURSA HIP Bilateral   . I & D KNEE WITH POLY EXCHANGE Left 03/02/2017   Procedure: Left knee Revision of poly liner;  Surgeon: Mcarthur Rossetti, MD;  Location: [place];  Service: Orthopedics;  Laterality: Left;  . JOINT REPLACEMENT    . KNEE ARTHROSCOPY Bilateral   . LAPAROSCOPIC CHOLECYSTECTOMY  2015  . LMF  2017   CardiMEMS HF implant for CHF (measures amount of fluid in the heart)  . LUMBAR DISC SURGERY    . LUMBAR LAMINECTOMY/DECOMPRESSION MICRODISCECTOMY Left  12/2005   L2-3 laminectomy and diskectomy/notes 01/06/2011  . POSTERIOR LUMBAR FUSION  04/2006   [name] 01/06/2011; "put cages in"  . REVERSE SHOULDER ARTHROPLASTY Left 06/15/2017   Procedure: REVERSE LEFT SHOULDER ARTHROPLASTY;  Surgeon: Meredith Pel, MD;  Location: [place];  Service: Orthopedics;  Laterality: Left;  . REVERSE SHOULDER ARTHROPLASTY Right 02/01/2018  . REVERSE SHOULDER ARTHROPLASTY Right 02/01/2018   Procedure: RIGHT REVERSE SHOULDER ARTHROPLASTY;  Surgeon: Meredith Pel, MD;  Location: [place];  Service: Orthopedics;  Laterality: Right;  . REVISION TOTAL KNEE ARTHROPLASTY Left 03/02/2017   poly liner/notes 03/02/2017  . RIGHT HEART CATH N/A 11/13/2016   Procedure: Right Heart Cath with Cardiomems;  Surgeon: Larey Dresser, MD;  Location: [place] CV LAB;  Service: Cardiovascular;  Laterality: N/A;  . SHOULDER ARTHROSCOPY WITH ROTATOR CUFF REPAIR Right   . SHOULDER  OPEN ROTATOR CUFF REPAIR Left 01/2011   [name] 01/29/2011  . TOTAL ABDOMINAL HYSTERECTOMY    . TOTAL KNEE ARTHROPLASTY Left   . TOTAL KNEE ARTHROPLASTY Right 11/18/2012   Procedure: TOTAL KNEE ARTHROPLASTY;  Surgeon: Yvette Rack., MD;  Location: [place];  Service: Orthopedics;  Laterality: Right;  . TUBAL LIGATION    . UPPER GASTROINTESTINAL ENDOSCOPY    . UPPER GI ENDOSCOPY      There were no vitals filed for this visit.   Subjective Assessment - 02/16/20 1015    Subjective  [name] presents to OT for Rx visit 5 to address BLE lymphedema. Pt has no new complaints. Pt is unaccompanied today. Pt states, "When I took the wraps off my leg was so little! You wouldn't believe how little it was!"    Pertinent History chronic BLE leg swelling and associated pain ~ 40 years; hx herniated intervertebral discL TKA; L total knee revision; HTN, Obesity. dementia; restless leg; CHF; unstable anina; R RC arthroplasty, OA, cervical spondylosis with myelopathy, chronic lower back pain, CKD (Stage?); Fibromyalgia; Hypothyroidism; OSA ( cPap?); hx pneumonia; RA, Type II Diabetes. S/P cardiac cathg, B THA, Lumbar laminectomy, posterior lumbar fusion, B shoulder arthroplasty; total abdominal hysterectomy    Limitations chronic leg swelling and associated pain, difficulty walking, impaired dynamic balance, muscle weakness,    Repetition Increases Symptoms    Special Tests + STEMMER sign base of toes bilaterally    Pain Onset --   s/p 44 yrs                       OT Treatments/Exercises (OP) - 02/16/20 0001      ADLs   ADL Education Given Yes (P)       Manual Therapy   Manual Therapy Edema management;Compression Bandaging;Manual Lymphatic Drainage (MLD) (P)     Manual Lymphatic Drainage (MLD) Commenced LLE MLD to LLE/LLQ utilizing modified "short neck sequence" with no strokes to neck in keeping w' LE thyroid precautions. UTILIZED DEEP ABDOMINAL PATHWAYS VIS DIAPHRAGMATIC BREATHING, FUNCTIONAL  INGUINAL ln, AND PROGIDED DYNAMIC STROKES TO LEG IN SEGMENTS FROM PROXIMAL TO DISTAL. eXCELLENT TOLERANCE. (P)     Compression Bandaging LLE gradient compression wraps using short stretch bandages. No foot wraps. (P)                   OT Education - 02/16/20 1132    Education Details Continued skilled Pt/caregiver education  And LE ADL training throughout visit for lymphedema self care/ home program, including compression wrapping, compression garment and device wear/care, lymphatic pumping ther ex, simple self-MLD, and  skin care. Discussed progress towards goals.    Person(s) Educated Patient    Methods Explanation;Demonstration;Handout    Comprehension Verbalized understanding;Returned demonstration;Need further instruction               OT Long Term Goals - 02/09/20 1334      OT LONG TERM GOAL #1   Title Pt will be able to apply LLE multi-layer, short stretch compression wraps daily with maximum caregiver assistance, using correct gradient techniques independently to return affected limb/s, as closely as possible, to premorbid size and shape, to limit infection risk, and to improve safe functional mobility and ADLs performance.    Baseline dependent    Time 4    Period Days    Status New    Target Date --   4th OT Rx visit     OT LONG TERM GOAL #2   Title Pt will be able to verbalize signs and symptoms of cellulitis infection and identify 4 common lymphedema precautions using printed resource for reference (modified independence) to limit LE progression over time.    Baseline Max A    Time 4    Period Days    Status New    Target Date --   4th OT Rx visit     OT LONG TERM GOAL #3   Title Pt will sustain than 85% compliance with all daily LE self-care home program components throughout Intensive Phase CDT, including impeccable skin care, lymphatic pumping therex,  compression wraps and simple self MLD, to ensure optimal limb volume reduction, to limit infection risk and  to limit LE progression.    Baseline dependent    Time 12    Period Weeks    Status New    Target Date 05/09/20      OT LONG TERM GOAL #4   Title Pt to achieve at least 10% LLE limb volume reduction during Intensive Phase CDT with Max CG assistance for wrapping, to improve functional performance of basic and instrumental ADLs, and to limit LE progression.    Baseline dependent    Time 12    Period Weeks    Status New      OT LONG TERM GOAL #5   Title During self-management phase of CDT Pt will retain limb volume reductions achieved during Intensive Phase CDT with no more than 3% volume increase using all LE self care components  to limit LE progression and further functional decline.    Baseline dependent    Time 6    Period Months    Status New    Target Date 08/07/20                 Plan - 02/16/20 1132    Clinical Impression Statement Pt building increased tolerance to compression wraps. Pt tolerated mld AND THIGH LENGTH GRADIENT WRAP TODAY WITHOUT INCREASED PAIN. We altered L shoe by adding additional velcro hook to extend closure , and we were able to fit existing shoe over wraps today to reduce falls risk . Cobnt asd per POC.    OT Occupational Profile and History Comprehensive Assessment- Review of records and extensive additional review of physical, cognitive, psychosocial history related to current functional performance    Occupational performance deficits (Please refer to evaluation for details): ADL's;IADL's;Rest and Sleep;Work;Leisure;Social Participation    Rehab Potential Good    Clinical Decision Making Multiple treatment options, significant modification of task necessary    Comorbidities Affecting Occupational Performance: Presence of comorbidities impacting occupational performance  Modification or Assistance to Complete Evaluation  Min-Moderate modification of tasks or assist with assess necessary to complete eval    OT Frequency 3x / week    OT Duration  12 weeks   and PRN support during Self Management Phase   OT Treatment/Interventions Self-care/ADL training;Therapeutic exercise;Functional Mobility Training;Manual Therapy;Energy conservation;Manual lymph drainage;Therapeutic activities;Coping strategies training;DME and/or AE instruction;Compression bandaging;Patient/family education    Plan Complete Deconhgestive Therapy (CDT) Intensive and Self -Management phases, consisting of Manual lymphatic Drainage (MLD), skin care, ther ex and compression bandaging    OT Home Exercise Plan lymphatic pumping therex 2-3 x q day, in sequence, 10 reps bilaterally    Recommended Other Services fit with BLE custom, flat knit compression garments.. Consider fitting with advanced sequential pneumatic compression device (Flexitouch) .           Patient will benefit from skilled therapeutic intervention in order to improve the following deficits and impairments:           Visit Diagnosis: Lymphedema, not elsewhere classified    Problem List Patient Active Problem List   Diagnosis Date Noted  . Iron deficiency 08/06/2018  . History of total left knee replacement (TKR) 07/18/2018  . Rectal bleeding 07/05/2018  . Hemorrhoids 07/05/2018  . Anal itching 07/05/2018  . Rotator cuff arthropathy of right shoulder   . Shoulder arthritis 06/15/2017  . Status post revision of total knee replacement, left 04/29/2017  . Polyethylene liner wear following left total knee arthroplasty requiring isolated polyethylene liner exchange ([place]) 03/02/2017  . 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 Unstable angina ([place]) 05/21/2016  . Chronic diastolic heart failure ([place]) 03/29/2016  . Normal coronary arteries 2009 01/14/2015  . Obesity-BMI 40 01/14/2015  . Restless leg 01/14/2015  . Chest pain 01/14/2015  . Back pain 01/14/2015  . Renal insufficiency 01/14/2015  . Dementia- mild memory issues 01/14/2015  . Occult blood positive stool  12/14/2014  . Anemia 12/14/2014  . Family history of colon cancer 12/14/2014  . Change in bowel habits 12/14/2014  . GERD (gastroesophageal reflux disease) 12/14/2014  . Dyspnea 05/17/2012  . Lymphedema 05/17/2012  . Weight gain 05/17/2012  . HTN (hypertension) 05/17/2012   Andrey Spearman, MS, OTR/L, [facility] 02/16/20 11:35 AM   [place] MAIN [facility] SERVICES [address] [place], [place], [address] Phone: [phone]   Fax:  [phone]  Name: SINDI BECKWORTH MRN: [id number] Date of Birth: 09-13-1945

## 2020-02-19 ENCOUNTER — Ambulatory Visit
Admission: RE | Admit: 2020-02-19 | Discharge: 2020-02-19 | Disposition: A | Discharge status: Home / Self Care | Payer: Medicare Other | Source: Ambulatory Visit | Attending: Orthopedic Surgery | Admitting: Orthopedic Surgery

## 2020-02-19 ENCOUNTER — Other Ambulatory Visit: Payer: Self-pay

## 2020-02-19 DIAGNOSIS — M542 Cervicalgia: Secondary | ICD-10-CM

## 2020-02-20 ENCOUNTER — Ambulatory Visit: Payer: Medicare Other | Admitting: Occupational Therapy

## 2020-02-20 DIAGNOSIS — I890 Lymphedema, not elsewhere classified: Secondary | ICD-10-CM

## 2020-02-20 NOTE — Therapy (Signed)
[place] MAIN [facility] SERVICES [address] [place], [place], [address] Phone: [phone]   Fax:  [phone]  Occupational Therapy Treatment  Patient Details  Name: [name] MRN: [id number] Date of Birth: 1946-01-05 Referring Provider (OT): Juluis Pitch, MD   Encounter Date: 02/20/2020   OT End of Session - 02/20/20 1227    Visit Number 6    Number of Visits 36    Date for OT Re-Evaluation 05/09/20    OT Start Time 1102    OT Stop Time 1214    OT Time Calculation (min) 72 min    Activity Tolerance Patient tolerated treatment well;No increased pain    Behavior During Therapy WFL for tasks assessed/performed           Past Medical History:  Diagnosis Date  . Anemia   . Anxiety   . Arthritis    "hands, feet" (03/03/2017)  . Brain lesion    2 types  . Cataract   . Cervical spondylosis with myelopathy   . Chest pain    Normal cardiac cath 5/09  . Chronic lower back pain   . CKD (chronic kidney disease), stage III    [name]  . Congestive heart failure (CHF) ([place])    has a Cardiomems implant   . Constipation   . Dementia ([place])   . Depression   . Dumping syndrome   . Dyspnea    with activity  . Edema   . Fatigue   . Fibromyalgia   . GERD (gastroesophageal reflux disease)   . Headache    "w/high CBG" (03/03/2017)  . History of echocardiogram    a. Echo 03/20/16 (done at [facility] in [place], [place]):  mild LVH, EF 91%, normal diastolic function, mild LAE, MAC, RVSP 25 mmHg  . Hyperlipidemia   . Hypertension   . Hypothyroidism   . Lumbar spondylosis   . Lymphedema    seeing specialist for this  . Migraine    "mostly stopped when I changed my diet" (03/03/2017)  . OSA on CPAP   . Pneumonia X 1  . PONV (postoperative nausea and vomiting)   . Restless leg syndrome   . Rheumatoid arthritis ([place])   . Stroke [name])    TIAs "mini strokes"- unsure of last TIA - pt and daughter deny this  .  Syncope   . Tremors of nervous system   . Type II diabetes mellitus ([place])     Past Surgical History:  Procedure Laterality Date  . APPENDECTOMY  1966  . BACK SURGERY    . CARDIAC CATHETERIZATION N/A 05/25/2016   Procedure: Right/Left Heart Cath and Coronary Angiography;  Surgeon: Larey Dresser, MD;  Location: [place] CV LAB;  Service: Cardiovascular;  Laterality: N/A;  . CATARACT EXTRACTION W/ INTRAOCULAR LENS  IMPLANT, BILATERAL Bilateral   . COLONOSCOPY    . CORONARY ANGIOGRAM  2009   Normal coronaries  . EXCISION/RELEASE BURSA HIP Bilateral   . I & D KNEE WITH POLY EXCHANGE Left 03/02/2017   Procedure: Left knee Revision of poly liner;  Surgeon: Mcarthur Rossetti, MD;  Location: [place];  Service: Orthopedics;  Laterality: Left;  . JOINT REPLACEMENT    . KNEE ARTHROSCOPY Bilateral   . LAPAROSCOPIC CHOLECYSTECTOMY  2015  . LMF  2017   CardiMEMS HF implant for CHF (measures amount of fluid in the heart)  . LUMBAR DISC SURGERY    . LUMBAR LAMINECTOMY/DECOMPRESSION MICRODISCECTOMY Left  12/2005   L2-3 laminectomy and diskectomy/notes 01/06/2011  . POSTERIOR LUMBAR FUSION  04/2006   [name] 01/06/2011; "put cages in"  . REVERSE SHOULDER ARTHROPLASTY Left 06/15/2017   Procedure: REVERSE LEFT SHOULDER ARTHROPLASTY;  Surgeon: Meredith Pel, MD;  Location: [place];  Service: Orthopedics;  Laterality: Left;  . REVERSE SHOULDER ARTHROPLASTY Right 02/01/2018  . REVERSE SHOULDER ARTHROPLASTY Right 02/01/2018   Procedure: RIGHT REVERSE SHOULDER ARTHROPLASTY;  Surgeon: Meredith Pel, MD;  Location: [place];  Service: Orthopedics;  Laterality: Right;  . REVISION TOTAL KNEE ARTHROPLASTY Left 03/02/2017   poly liner/notes 03/02/2017  . RIGHT HEART CATH N/A 11/13/2016   Procedure: Right Heart Cath with Cardiomems;  Surgeon: Larey Dresser, MD;  Location: [place] CV LAB;  Service: Cardiovascular;  Laterality: N/A;  . SHOULDER ARTHROSCOPY WITH ROTATOR CUFF REPAIR Right   . SHOULDER  OPEN ROTATOR CUFF REPAIR Left 01/2011   [name] 01/29/2011  . TOTAL ABDOMINAL HYSTERECTOMY    . TOTAL KNEE ARTHROPLASTY Left   . TOTAL KNEE ARTHROPLASTY Right 11/18/2012   Procedure: TOTAL KNEE ARTHROPLASTY;  Surgeon: Yvette Rack., MD;  Location: [place];  Service: Orthopedics;  Laterality: Right;  . TUBAL LIGATION    . UPPER GASTROINTESTINAL ENDOSCOPY    . UPPER GI ENDOSCOPY      There were no vitals filed for this visit.   Subjective Assessment - 02/20/20 1224    Subjective  Mrs Nowakowski presents to OT for Rx visit 6/36 to address BLE lymphedema. Pt has no new complaints. Pt is unaccompanied today. Pt states, "I kept the wraps on for 23 hours. They were lose the whole time really."    Pertinent History chronic BLE leg swelling and associated pain ~ 40 years; hx herniated intervertebral discL TKA; L total knee revision; HTN, Obesity. dementia; restless leg; CHF; unstable anina; R RC arthroplasty, OA, cervical spondylosis with myelopathy, chronic lower back pain, CKD (Stage?); Fibromyalgia; Hypothyroidism; OSA ( cPap?); hx pneumonia; RA, Type II Diabetes. S/P cardiac cathg, B THA, Lumbar laminectomy, posterior lumbar fusion, B shoulder arthroplasty; total abdominal hysterectomy    Limitations chronic leg swelling and associated pain, difficulty walking, impaired dynamic balance, muscle weakness,    Repetition Increases Symptoms    Special Tests + STEMMER sign base of toes bilaterally    Pain Onset --   s/p 45 yrs                       OT Treatments/Exercises (OP) - 02/20/20 0001      ADLs   ADL Education Given Yes      Manual Therapy   Manual Therapy Edema management;Compression Bandaging;Manual Lymphatic Drainage (MLD)    Manual Lymphatic Drainage (MLD)  LLE MLD to LLE/LLQ utilizing modified "short neck sequence" with no strokes to neck in keeping w' LE thyroid precautions. UTILIZED DEEP ABDOMINAL PATHWAYS VIS DIAPHRAGMATIC BREATHING, FUNCTIONAL INGUINAL ln, AND PROGIDED  DYNAMIC STROKES TO LEG IN SEGMENTS FROM PROXIMAL TO DISTAL. eXCELLENT TOLERANCE.    Compression Bandaging LLE thigh length gradient compression wraps using short stretch bandages. as established                  OT Education - 02/20/20 1225    Education Details Continued skilled Pt/caregiver education  And LE ADL training throughout visit for lymphedema self care/ home program, including compression wrapping, compression garment and device wear/care, lymphatic pumping ther ex, simple self-MLD, and skin care. Discussed progress towards goals.    Person(s)  Educated Patient    Methods Explanation;Demonstration;Handout    Comprehension Verbalized understanding;Returned demonstration;Need further instruction               OT Long Term Goals - 02/09/20 1334      OT LONG TERM GOAL #1   Title Pt will be able to apply LLE multi-layer, short stretch compression wraps daily with maximum caregiver assistance, using correct gradient techniques independently to return affected limb/s, as closely as possible, to premorbid size and shape, to limit infection risk, and to improve safe functional mobility and ADLs performance.    Baseline dependent    Time 4    Period Days    Status New    Target Date --   4th OT Rx visit     OT LONG TERM GOAL #2   Title Pt will be able to verbalize signs and symptoms of cellulitis infection and identify 4 common lymphedema precautions using printed resource for reference (modified independence) to limit LE progression over time.    Baseline Max A    Time 4    Period Days    Status New    Target Date --   4th OT Rx visit     OT LONG TERM GOAL #3   Title Pt will sustain than 85% compliance with all daily LE self-care home program components throughout Intensive Phase CDT, including impeccable skin care, lymphatic pumping therex,  compression wraps and simple self MLD, to ensure optimal limb volume reduction, to limit infection risk and to limit LE  progression.    Baseline dependent    Time 12    Period Weeks    Status New    Target Date 05/09/20      OT LONG TERM GOAL #4   Title Pt to achieve at least 10% LLE limb volume reduction during Intensive Phase CDT with Max CG assistance for wrapping, to improve functional performance of basic and instrumental ADLs, and to limit LE progression.    Baseline dependent    Time 12    Period Weeks    Status New      OT LONG TERM GOAL #5   Title During self-management phase of CDT Pt will retain limb volume reductions achieved during Intensive Phase CDT with no more than 3% volume increase using all LE self care components  to limit LE progression and further functional decline.    Baseline dependent    Time 6    Period Months    Status New    Target Date 08/07/20                 Plan - 02/20/20 1227    Clinical Impression Statement Pt tolerated MLD, skin acre , and compression wraps without increased pain. Limb volume appears to be slightly reduced, but tends to be concentrated from mid thigh to mid leg. Cont as per POC.           Patient will benefit from skilled therapeutic intervention in order to improve the following deficits and impairments:           Visit Diagnosis: Lymphedema, not elsewhere classified    Problem List Patient Active Problem List   Diagnosis Date Noted  . Iron deficiency 08/06/2018  . History of total left knee replacement (TKR) 07/18/2018  . Rectal bleeding 07/05/2018  . Hemorrhoids 07/05/2018  . Anal itching 07/05/2018  . Rotator cuff arthropathy of right shoulder   . Shoulder arthritis 06/15/2017  . Status post revision  of total knee replacement, left 04/29/2017  . Polyethylene liner wear following left total knee arthroplasty requiring isolated polyethylene liner exchange ([place]) 03/02/2017  . 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 Unstable angina ([place]) 05/21/2016  . Chronic diastolic heart failure ([place])  03/29/2016  . Normal coronary arteries 2009 01/14/2015  . Obesity-BMI 40 01/14/2015  . Restless leg 01/14/2015  . Chest pain 01/14/2015  . Back pain 01/14/2015  . Renal insufficiency 01/14/2015  . Dementia- mild memory issues 01/14/2015  . Occult blood positive stool 12/14/2014  . Anemia 12/14/2014  . Family history of colon cancer 12/14/2014  . Change in bowel habits 12/14/2014  . GERD (gastroesophageal reflux disease) 12/14/2014  . Dyspnea 05/17/2012  . Lymphedema 05/17/2012  . Weight gain 05/17/2012  . HTN (hypertension) 05/17/2012   Andrey Spearman, MS, OTR/L, [facility] 02/20/20 12:29 PM   [place] MAIN [facility] SERVICES [address] [place], [place], [address] Phone: [phone]   Fax:  [phone]  Name: NAPHTALI RIEDE MRN: [id number] Date of Birth: [birth year]

## 2020-02-22 ENCOUNTER — Ambulatory Visit: Payer: Medicare Other | Attending: Family Medicine | Admitting: Occupational Therapy

## 2020-02-22 ENCOUNTER — Other Ambulatory Visit: Payer: Self-pay

## 2020-02-22 DIAGNOSIS — I890 Lymphedema, not elsewhere classified: Secondary | ICD-10-CM

## 2020-02-22 NOTE — Therapy (Signed)
[place] MAIN [facility] SERVICES [address] [place], [place], [address] Phone: [phone]   Fax:  [phone]  Occupational Therapy Treatment  Patient Details  Name: [name] MRN: [id number] Date of Birth: Jun 02, 1946 Referring Provider (OT): Juluis Pitch, MD   Encounter Date: 02/22/2020   OT End of Session - 02/22/20 1304    Visit Number 7    Number of Visits 36    Date for OT Re-Evaluation 05/09/20    OT Start Time 1779    OT Stop Time 3903    OT Time Calculation (min) 78 min    Activity Tolerance Patient tolerated treatment well;No increased pain    Behavior During Therapy WFL for tasks assessed/performed           Past Medical History:  Diagnosis Date  . Anemia   . Anxiety   . Arthritis    "hands, feet" (03/03/2017)  . Brain lesion    2 types  . Cataract   . Cervical spondylosis with myelopathy   . Chest pain    Normal cardiac cath 5/09  . Chronic lower back pain   . CKD (chronic kidney disease), stage III    [name]  . Congestive heart failure (CHF) ([place])    has a Cardiomems implant   . Constipation   . Dementia ([place])   . Depression   . Dumping syndrome   . Dyspnea    with activity  . Edema   . Fatigue   . Fibromyalgia   . GERD (gastroesophageal reflux disease)   . Headache    "w/high CBG" (03/03/2017)  . History of echocardiogram    a. Echo 03/20/16 (done at [facility] in [place], [place]):  mild LVH, EF 00%, normal diastolic function, mild LAE, MAC, RVSP 25 mmHg  . Hyperlipidemia   . Hypertension   . Hypothyroidism   . Lumbar spondylosis   . Lymphedema    seeing specialist for this  . Migraine    "mostly stopped when I changed my diet" (03/03/2017)  . OSA on CPAP   . Pneumonia X 1  . PONV (postoperative nausea and vomiting)   . Restless leg syndrome   . Rheumatoid arthritis ([place])   . Stroke [facility])    TIAs "mini strokes"- unsure of last TIA - pt and daughter deny this  .  Syncope   . Tremors of nervous system   . Type II diabetes mellitus ([place])     Past Surgical History:  Procedure Laterality Date  . APPENDECTOMY  1966  . BACK SURGERY    . CARDIAC CATHETERIZATION N/A 05/25/2016   Procedure: Right/Left Heart Cath and Coronary Angiography;  Surgeon: Larey Dresser, MD;  Location: [place] CV [facility];  Service: Cardiovascular;  Laterality: N/A;  . CATARACT EXTRACTION W/ INTRAOCULAR LENS  IMPLANT, BILATERAL Bilateral   . COLONOSCOPY    . CORONARY ANGIOGRAM  2009   Normal coronaries  . EXCISION/RELEASE BURSA HIP Bilateral   . I & D KNEE WITH POLY EXCHANGE Left 03/02/2017   Procedure: Left knee Revision of poly liner;  Surgeon: Mcarthur Rossetti, MD;  Location: [place];  Service: Orthopedics;  Laterality: Left;  . JOINT REPLACEMENT    . KNEE ARTHROSCOPY Bilateral   . LAPAROSCOPIC CHOLECYSTECTOMY  2015  . LMF  2017   CardiMEMS HF implant for CHF (measures amount of fluid in the heart)  . LUMBAR DISC SURGERY    . LUMBAR LAMINECTOMY/DECOMPRESSION MICRODISCECTOMY Left  12/2005   L2-3 laminectomy and diskectomy/notes 01/06/2011  . POSTERIOR LUMBAR FUSION  04/2006   [name] 01/06/2011; "put cages in"  . REVERSE SHOULDER ARTHROPLASTY Left 06/15/2017   Procedure: REVERSE LEFT SHOULDER ARTHROPLASTY;  Surgeon: Meredith Pel, MD;  Location: [place];  Service: Orthopedics;  Laterality: Left;  . REVERSE SHOULDER ARTHROPLASTY Right 02/01/2018  . REVERSE SHOULDER ARTHROPLASTY Right 02/01/2018   Procedure: RIGHT REVERSE SHOULDER ARTHROPLASTY;  Surgeon: Meredith Pel, MD;  Location: [place];  Service: Orthopedics;  Laterality: Right;  . REVISION TOTAL KNEE ARTHROPLASTY Left 03/02/2017   poly liner/notes 03/02/2017  . RIGHT HEART CATH N/A 11/13/2016   Procedure: Right Heart Cath with Cardiomems;  Surgeon: Larey Dresser, MD;  Location: [place] CV LAB;  Service: Cardiovascular;  Laterality: N/A;  . SHOULDER ARTHROSCOPY WITH ROTATOR CUFF REPAIR Right   . SHOULDER  OPEN ROTATOR CUFF REPAIR Left 01/2011   [name] 01/29/2011  . TOTAL ABDOMINAL HYSTERECTOMY    . TOTAL KNEE ARTHROPLASTY Left   . TOTAL KNEE ARTHROPLASTY Right 11/18/2012   Procedure: TOTAL KNEE ARTHROPLASTY;  Surgeon: Yvette Rack., MD;  Location: [place];  Service: Orthopedics;  Laterality: Right;  . TUBAL LIGATION    . UPPER GASTROINTESTINAL ENDOSCOPY    . UPPER GI ENDOSCOPY      There were no vitals filed for this visit.   Subjective Assessment - 02/22/20 1118    Subjective  [name] presents to OT for Rx visit 7/36 to address BLE lymphedema. Pt reports she was able to keep compression wraps in place until 3 PM the day after her last visit. Pt denies leg pain this morning. She completes her transfer from wc to Rx bed with SBA.    Pertinent History chronic BLE leg swelling and associated pain ~ 40 years; hx herniated intervertebral discL TKA; L total knee revision; HTN, Obesity. dementia; restless leg; CHF; unstable anina; R RC arthroplasty, OA, cervical spondylosis with myelopathy, chronic lower back pain, CKD (Stage?); Fibromyalgia; Hypothyroidism; OSA ( cPap?); hx pneumonia; RA, Type II Diabetes. S/P cardiac cathg, B THA, Lumbar laminectomy, posterior lumbar fusion, B shoulder arthroplasty; total abdominal hysterectomy    Limitations chronic leg swelling and associated pain, difficulty walking, impaired dynamic balance, muscle weakness,    Repetition Increases Symptoms    Special Tests + STEMMER sign base of toes bilaterally    Pain Onset --   s/p 44 yrs                       OT Treatments/Exercises (OP) - 02/22/20 0001      ADLs   ADL Education Given Yes (P)       Manual Therapy   Manual Therapy Edema management;Compression Bandaging;Manual Lymphatic Drainage (MLD) (P)     Manual Lymphatic Drainage (MLD)  LLE MLD to LLE/LLQ utilizing modified "short neck sequence" with no strokes to neck in keeping w' LE thyroid precautions. UTILIZED DEEP ABDOMINAL PATHWAYS VIS  DIAPHRAGMATIC BREATHING, FUNCTIONAL INGUINAL ln, AND PROGIDED DYNAMIC STROKES TO LEG IN SEGMENTS FROM PROXIMAL TO DISTAL. eXCELLENT TOLERANCE. (P)     Compression Bandaging LLE thigh length gradient compression wraps using short stretch bandages. as established (P)                   OT Education - 02/22/20 1304    Education Details Continued skilled Pt/caregiver education  And LE ADL training throughout visit for lymphedema self care/ home program, including compression wrapping, compression garment and  device wear/care, lymphatic pumping ther ex, simple self-MLD, and skin care. Discussed progress towards goals.    Person(s) Educated Patient    Methods Explanation;Demonstration;Handout    Comprehension Verbalized understanding;Returned demonstration;Need further instruction               OT Long Term Goals - 02/09/20 1334      OT LONG TERM GOAL #1   Title Pt will be able to apply LLE multi-layer, short stretch compression wraps daily with maximum caregiver assistance, using correct gradient techniques independently to return affected limb/s, as closely as possible, to premorbid size and shape, to limit infection risk, and to improve safe functional mobility and ADLs performance.    Baseline dependent    Time 4    Period Days    Status New    Target Date --   4th OT Rx visit     OT LONG TERM GOAL #2   Title Pt will be able to verbalize signs and symptoms of cellulitis infection and identify 4 common lymphedema precautions using printed resource for reference (modified independence) to limit LE progression over time.    Baseline Max A    Time 4    Period Days    Status New    Target Date --   4th OT Rx visit     OT LONG TERM GOAL #3   Title Pt will sustain than 85% compliance with all daily LE self-care home program components throughout Intensive Phase CDT, including impeccable skin care, lymphatic pumping therex,  compression wraps and simple self MLD, to ensure optimal  limb volume reduction, to limit infection risk and to limit LE progression.    Baseline dependent    Time 12    Period Weeks    Status New    Target Date 05/09/20      OT LONG TERM GOAL #4   Title Pt to achieve at least 10% LLE limb volume reduction during Intensive Phase CDT with Max CG assistance for wrapping, to improve functional performance of basic and instrumental ADLs, and to limit LE progression.    Baseline dependent    Time 12    Period Weeks    Status New      OT LONG TERM GOAL #5   Title During self-management phase of CDT Pt will retain limb volume reductions achieved during Intensive Phase CDT with no more than 3% volume increase using all LE self care components  to limit LE progression and further functional decline.    Baseline dependent    Time 6    Period Months    Status New    Target Date 08/07/20                 Plan - 02/22/20 1536    Clinical Impression Statement LLE limb volume is noticably decreased by visual assessment and palpation since commencing CDT. Pt has achieved tolerance and 85 % compliance with wraps between visits. Pt is tolerating all aspects of CDT. MLD performed and compression wraps applied today without increased pain. Cont as per POC.    OT Occupational Profile and History Problem Focused Assessment - Including review of records relating to presenting problem           Patient will benefit from skilled therapeutic intervention in order to improve the following deficits and impairments:           Visit Diagnosis: Lymphedema, not elsewhere classified    Problem List Patient Active Problem List  Diagnosis Date Noted  . Iron deficiency 08/06/2018  . History of total left knee replacement (TKR) 07/18/2018  . Rectal bleeding 07/05/2018  . Hemorrhoids 07/05/2018  . Anal itching 07/05/2018  . Rotator cuff arthropathy of right shoulder   . Shoulder arthritis 06/15/2017  . Status post revision of total knee replacement,  left 04/29/2017  . Polyethylene liner wear following left total knee arthroplasty requiring isolated polyethylene liner exchange ([place]) 03/02/2017  . 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 Unstable angina ([place]) 05/21/2016  . Chronic diastolic heart failure ([place]) 03/29/2016  . Normal coronary arteries 2009 01/14/2015  . Obesity-BMI 40 01/14/2015  . Restless leg 01/14/2015  . Chest pain 01/14/2015  . Back pain 01/14/2015  . Renal insufficiency 01/14/2015  . Dementia- mild memory issues 01/14/2015  . Occult blood positive stool 12/14/2014  . Anemia 12/14/2014  . Family history of colon cancer 12/14/2014  . Change in bowel habits 12/14/2014  . GERD (gastroesophageal reflux disease) 12/14/2014  . Dyspnea 05/17/2012  . Lymphedema 05/17/2012  . Weight gain 05/17/2012  . HTN (hypertension) 05/17/2012    Andrey Spearman, MS, OTR/L, [facility] 02/22/20 3:39 PM  [place] MAIN [facility] SERVICES [address] [place], [place], [address] Phone: [phone]   Fax:  [phone]  Name: ROBERTINE KIPPER MRN: [id number] Date of Birth: 15-Oct-1945

## 2020-02-28 ENCOUNTER — Other Ambulatory Visit: Payer: Self-pay

## 2020-02-28 ENCOUNTER — Ambulatory Visit: Payer: Medicare Other | Admitting: Occupational Therapy

## 2020-02-28 DIAGNOSIS — I890 Lymphedema, not elsewhere classified: Secondary | ICD-10-CM | POA: Diagnosis not present

## 2020-02-28 NOTE — Therapy (Signed)
[place] MAIN [facility] SERVICES [address] [place], [place], [address] Phone: [phone]   Fax:  [phone]  Occupational Therapy Treatment  Patient Details  Name: [name] MRN: [id number] Date of Birth: [birth year] Referring Provider (OT): [name] Pitch, MD   Encounter Date: 02/28/2020   OT End of Session - 02/28/20 1322    Visit Number 8    Number of Visits 36    Date for OT Re-Evaluation 05/09/20    OT Start Time 0915    OT Stop Time 1020    OT Time Calculation (min) 65 min    Activity Tolerance Patient tolerated treatment well;No increased pain    Behavior During Therapy WFL for tasks assessed/performed           Past Medical History:  Diagnosis Date  . Anemia   . Anxiety   . Arthritis    "hands, feet" (03/03/2017)  . Brain lesion    2 types  . Cataract   . Cervical spondylosis with myelopathy   . Chest pain    Normal cardiac cath 5/09  . Chronic lower back pain   . CKD (chronic kidney disease), stage III    [name]  . Congestive heart failure (CHF) ([place])    has a Cardiomems implant   . Constipation   . Dementia ([place])   . Depression   . Dumping syndrome   . Dyspnea    with activity  . Edema   . Fatigue   . Fibromyalgia   . GERD (gastroesophageal reflux disease)   . Headache    "w/high CBG" (03/03/2017)  . History of echocardiogram    a. Echo 03/20/16 (done at [facility] in [place], [place]):  mild LVH, EF 78%, normal diastolic function, mild LAE, MAC, RVSP 25 mmHg  . Hyperlipidemia   . Hypertension   . Hypothyroidism   . Lumbar spondylosis   . Lymphedema    seeing specialist for this  . Migraine    "mostly stopped when I changed my diet" (03/03/2017)  . OSA on CPAP   . Pneumonia X 1  . PONV (postoperative nausea and vomiting)   . Restless leg syndrome   . Rheumatoid arthritis ([place])   . Stroke [facility])    TIAs "mini strokes"- unsure of last TIA - pt and daughter deny this  .  Syncope   . Tremors of nervous system   . Type II diabetes mellitus ([place])     Past Surgical History:  Procedure Laterality Date  . APPENDECTOMY  1966  . BACK SURGERY    . CARDIAC CATHETERIZATION N/A 05/25/2016   Procedure: Right/Left Heart Cath and Coronary Angiography;  Surgeon: [name] Dresser, MD;  Location: [place] CV LAB;  Service: Cardiovascular;  Laterality: N/A;  . CATARACT EXTRACTION W/ INTRAOCULAR LENS  IMPLANT, BILATERAL Bilateral   . COLONOSCOPY    . CORONARY ANGIOGRAM  2009   Normal coronaries  . EXCISION/RELEASE BURSA HIP Bilateral   . I & D KNEE WITH POLY EXCHANGE Left 03/02/2017   Procedure: Left knee Revision of poly liner;  Surgeon: [name] Rossetti, MD;  Location: [place];  Service: Orthopedics;  Laterality: Left;  . JOINT REPLACEMENT    . KNEE ARTHROSCOPY Bilateral   . LAPAROSCOPIC CHOLECYSTECTOMY  2015  . LMF  2017   CardiMEMS HF implant for CHF (measures amount of fluid in the heart)  . LUMBAR DISC SURGERY    . LUMBAR LAMINECTOMY/DECOMPRESSION MICRODISCECTOMY Left  12/2005   L2-3 laminectomy and diskectomy/notes 01/06/2011  . POSTERIOR LUMBAR FUSION  04/2006   [name] 01/06/2011; "put cages in"  . REVERSE SHOULDER ARTHROPLASTY Left 06/15/2017   Procedure: REVERSE LEFT SHOULDER ARTHROPLASTY;  Surgeon: [name] [place], MD;  Location: [place];  Service: Orthopedics;  Laterality: Left;  . REVERSE SHOULDER ARTHROPLASTY Right 02/01/2018  . REVERSE SHOULDER ARTHROPLASTY Right 02/01/2018   Procedure: RIGHT REVERSE SHOULDER ARTHROPLASTY;  Surgeon: [name] [place], MD;  Location: [place];  Service: Orthopedics;  Laterality: Right;  . REVISION TOTAL KNEE ARTHROPLASTY Left 03/02/2017   poly liner/notes 03/02/2017  . RIGHT HEART CATH N/A 11/13/2016   Procedure: Right Heart Cath with Cardiomems;  Surgeon: [name] Dresser, MD;  Location: [place] CV LAB;  Service: Cardiovascular;  Laterality: N/A;  . SHOULDER ARTHROSCOPY WITH ROTATOR CUFF REPAIR Right   . SHOULDER  OPEN ROTATOR CUFF REPAIR Left 01/2011   [name] 01/29/2011  . TOTAL ABDOMINAL HYSTERECTOMY    . TOTAL KNEE ARTHROPLASTY Left   . TOTAL KNEE ARTHROPLASTY Right 11/18/2012   Procedure: TOTAL KNEE ARTHROPLASTY;  Surgeon: [name] Rack., MD;  Location: [place];  Service: Orthopedics;  Laterality: Right;  . TUBAL LIGATION    . UPPER GASTROINTESTINAL ENDOSCOPY    . UPPER GI ENDOSCOPY      There were no vitals filed for this visit.   Subjective Assessment - 02/28/20 1320    Subjective  [name] presents to OT for Rx visit 8/36 to address BLE lymphedema. Pt reports she is feeling unwell due to a sinus infection and because she did not sleep well last night. Ot session late getting started b/c Pt needed to use restroom and needed Max A x 2   to transfer from toilet to w/c due to low seat height.    Pertinent History chronic BLE leg swelling and associated pain ~ 40 years; hx herniated intervertebral discL TKA; L total knee revision; HTN, Obesity. dementia; restless leg; CHF; unstable anina; R RC arthroplasty, OA, cervical spondylosis with myelopathy, chronic lower back pain, CKD (Stage?); Fibromyalgia; Hypothyroidism; OSA ( cPap?); hx pneumonia; RA, Type II Diabetes. S/P cardiac cathg, B THA, Lumbar laminectomy, posterior lumbar fusion, B shoulder arthroplasty; total abdominal hysterectomy    Limitations chronic leg swelling and associated pain, difficulty walking, impaired dynamic balance, muscle weakness,    Repetition Increases Symptoms    Special Tests + STEMMER sign base of toes bilaterally    Pain Onset --   s/p 44 yrs                       OT Treatments/Exercises (OP) - 02/28/20 0001      ADLs   ADL Education Given Yes      Manual Therapy   Manual Therapy Edema management;Compression Bandaging;Manual Lymphatic Drainage (MLD)    Manual Lymphatic Drainage (MLD)  LLE MLD to LLE/LLQ utilizing modified "short neck sequence" with no strokes to neck in keeping w' LE thyroid  precautions. UTILIZED DEEP ABDOMINAL PATHWAYS VIS DIAPHRAGMATIC BREATHING, FUNCTIONAL INGUINAL ln, AND PROGIDED DYNAMIC STROKES TO LEG IN SEGMENTS FROM PROXIMAL TO DISTAL. eXCELLENT TOLERANCE.    Compression Bandaging LLE thigh length gradient compression wraps using short stretch bandages. as established                  OT Education - 02/28/20 1322    Education Details Continued skilled Pt/caregiver education  And LE ADL training throughout visit for lymphedema self care/ home program, including  compression wrapping, compression garment and device wear/care, lymphatic pumping ther ex, simple self-MLD, and skin care. Discussed progress towards goals.    Person(s) Educated Patient    Methods Explanation;Demonstration;Handout    Comprehension Verbalized understanding;Returned demonstration;Need further instruction               OT Long Term Goals - 02/09/20 1334      OT LONG TERM GOAL #1   Title Pt will be able to apply LLE multi-layer, short stretch compression wraps daily with maximum caregiver assistance, using correct gradient techniques independently to return affected limb/s, as closely as possible, to premorbid size and shape, to limit infection risk, and to improve safe functional mobility and ADLs performance.    Baseline dependent    Time 4    Period Days    Status New    Target Date --   4th OT Rx visit     OT LONG TERM GOAL #2   Title Pt will be able to verbalize signs and symptoms of cellulitis infection and identify 4 common lymphedema precautions using printed resource for reference (modified independence) to limit LE progression over time.    Baseline Max A    Time 4    Period Days    Status New    Target Date --   4th OT Rx visit     OT LONG TERM GOAL #3   Title Pt will sustain than 85% compliance with all daily LE self-care home program components throughout Intensive Phase CDT, including impeccable skin care, lymphatic pumping therex,  compression wraps  and simple self MLD, to ensure optimal limb volume reduction, to limit infection risk and to limit LE progression.    Baseline dependent    Time 12    Period Weeks    Status New    Target Date 05/09/20      OT LONG TERM GOAL #4   Title Pt to achieve at least 10% LLE limb volume reduction during Intensive Phase CDT with Max CG assistance for wrapping, to improve functional performance of basic and instrumental ADLs, and to limit LE progression.    Baseline dependent    Time 12    Period Weeks    Status New      OT LONG TERM GOAL #5   Title During self-management phase of CDT Pt will retain limb volume reductions achieved during Intensive Phase CDT with no more than 3% volume increase using all LE self care components  to limit LE progression and further functional decline.    Baseline dependent    Time 6    Period Months    Status New    Target Date 08/07/20                 Plan - 02/28/20 1324    Clinical Impression Statement LLE limb  volume is significantly increased today as Pt was unable to re-apply wraps over long holiday weekend. CNAs at her residential facility are not allowed by administrators to re-apply compression wraps without  certification. Pt is discouraged by set back today and is tearful for a few minutes during session.  Pt reminded that while lack of caregivers at residential facility where she lives is not ideal, she has made steady progress thus far and will again once recovered from this setback. Pt tolerated MLD, skin care and mid-thigh length compression wraps today without increased pain. Cont as per POC. Cont to provided Pt edu for coping strategies for high burden of  LE care and self-care PRN.    OT Occupational Profile and History Problem Focused Assessment - Including review of records relating to presenting problem           Patient will benefit from skilled therapeutic intervention in order to improve the following deficits and impairments:            Visit Diagnosis: Lymphedema, not elsewhere classified    Problem List Patient Active Problem List   Diagnosis Date Noted  . Iron deficiency 08/06/2018  . History of total left knee replacement (TKR) 07/18/2018  . Rectal bleeding 07/05/2018  . Hemorrhoids 07/05/2018  . Anal itching 07/05/2018  . Rotator cuff arthropathy of right shoulder   . Shoulder arthritis 06/15/2017  . Status post revision of total knee replacement, left 04/29/2017  . Polyethylene liner wear following left total knee arthroplasty requiring isolated polyethylene liner exchange ([place]) 03/02/2017  . 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 Unstable angina ([place]) 05/21/2016  . Chronic diastolic heart failure ([place]) 03/29/2016  . Normal coronary arteries 2009 01/14/2015  . Obesity-BMI 40 01/14/2015  . Restless leg 01/14/2015  . Chest pain 01/14/2015  . Back pain 01/14/2015  . Renal insufficiency 01/14/2015  . Dementia- mild memory issues 01/14/2015  . Occult blood positive stool 12/14/2014  . Anemia 12/14/2014  . Family history of colon cancer 12/14/2014  . Change in bowel habits 12/14/2014  . GERD (gastroesophageal reflux disease) 12/14/2014  . Dyspnea 05/17/2012  . Lymphedema 05/17/2012  . Weight gain 05/17/2012  . HTN (hypertension) 05/17/2012    Andrey Spearman, MS, OTR/L, [facility] 02/28/20 1:32 PM   [place] MAIN [facility] SERVICES [address] [place], [place], [address] Phone: [phone]   Fax:  [phone]  Name: [name] MRN: [id number] Date of Birth: Apr 23, 1946

## 2020-03-01 ENCOUNTER — Ambulatory Visit: Payer: Medicare Other | Admitting: Occupational Therapy

## 2020-03-01 ENCOUNTER — Other Ambulatory Visit: Payer: Self-pay

## 2020-03-01 DIAGNOSIS — I890 Lymphedema, not elsewhere classified: Secondary | ICD-10-CM | POA: Diagnosis not present

## 2020-03-01 NOTE — Therapy (Signed)
[place] MAIN [facility] SERVICES [address] [place], [place], [address] Phone: [phone]   Fax:  [phone]  Occupational Therapy Treatment  Patient Details  Name: [name] MRN: [id number] Date of Birth: 05-23-1946 Referring Provider (OT): Juluis Pitch, MD   Encounter Date: 03/01/2020   OT End of Session - 03/01/20 1102    Visit Number 9    Number of Visits 36    Date for OT Re-Evaluation 05/09/20    OT Start Time 0915    OT Stop Time 1015    OT Time Calculation (min) 60 min    Activity Tolerance Patient tolerated treatment well;No increased pain    Behavior During Therapy WFL for tasks assessed/performed           Past Medical History:  Diagnosis Date  . Anemia   . Anxiety   . Arthritis    "hands, feet" (03/03/2017)  . Brain lesion    2 types  . Cataract   . Cervical spondylosis with myelopathy   . Chest pain    Normal cardiac cath 5/09  . Chronic lower back pain   . CKD (chronic kidney disease), stage III    [name]  . Congestive heart failure (CHF) ([place])    has a Cardiomems implant   . Constipation   . Dementia ([place])   . Depression   . Dumping syndrome   . Dyspnea    with activity  . Edema   . Fatigue   . Fibromyalgia   . GERD (gastroesophageal reflux disease)   . Headache    "w/high CBG" (03/03/2017)  . History of echocardiogram    a. Echo 03/20/16 (done at [facility] in [place], [place]):  mild LVH, EF 69%, normal diastolic function, mild LAE, MAC, RVSP 25 mmHg  . Hyperlipidemia   . Hypertension   . Hypothyroidism   . Lumbar spondylosis   . Lymphedema    seeing specialist for this  . Migraine    "mostly stopped when I changed my diet" (03/03/2017)  . OSA on CPAP   . Pneumonia X 1  . PONV (postoperative nausea and vomiting)   . Restless leg syndrome   . Rheumatoid arthritis ([place])   . Stroke [facility])    TIAs "mini strokes"- unsure of last TIA - [name] and daughter deny this  .  Syncope   . Tremors of nervous system   . Type II diabetes mellitus ([place])     Past Surgical History:  Procedure Laterality Date  . APPENDECTOMY  1966  . BACK SURGERY    . CARDIAC CATHETERIZATION N/A 05/25/2016   Procedure: Right/Left Heart Cath and Coronary Angiography;  Surgeon: Larey Dresser, MD;  Location: [place] [facility];  Service: Cardiovascular;  Laterality: N/A;  . CATARACT EXTRACTION W/ INTRAOCULAR LENS  IMPLANT, BILATERAL Bilateral   . COLONOSCOPY    . CORONARY ANGIOGRAM  2009   Normal coronaries  . EXCISION/RELEASE BURSA HIP Bilateral   . I & D KNEE WITH POLY EXCHANGE Left 03/02/2017   Procedure: Left knee Revision of poly liner;  Surgeon: Mcarthur Rossetti, MD;  Location: [place];  Service: Orthopedics;  Laterality: Left;  . JOINT REPLACEMENT    . KNEE ARTHROSCOPY Bilateral   . LAPAROSCOPIC CHOLECYSTECTOMY  2015  . LMF  2017   CardiMEMS HF implant for CHF (measures amount of fluid in the heart)  . LUMBAR DISC SURGERY    . LUMBAR LAMINECTOMY/DECOMPRESSION MICRODISCECTOMY Left  12/2005   L2-3 laminectomy and diskectomy/notes 01/06/2011  . POSTERIOR LUMBAR FUSION  04/2006   [name] 01/06/2011; "put cages in"  . REVERSE SHOULDER ARTHROPLASTY Left 06/15/2017   Procedure: REVERSE LEFT SHOULDER ARTHROPLASTY;  Surgeon: Meredith Pel, MD;  Location: [place];  Service: Orthopedics;  Laterality: Left;  . REVERSE SHOULDER ARTHROPLASTY Right 02/01/2018  . REVERSE SHOULDER ARTHROPLASTY Right 02/01/2018   Procedure: RIGHT REVERSE SHOULDER ARTHROPLASTY;  Surgeon: Meredith Pel, MD;  Location: [place];  Service: Orthopedics;  Laterality: Right;  . REVISION TOTAL KNEE ARTHROPLASTY Left 03/02/2017   poly liner/notes 03/02/2017  . RIGHT HEART CATH N/A 11/13/2016   Procedure: Right Heart Cath with Cardiomems;  Surgeon: Larey Dresser, MD;  Location: [place] CV LAB;  Service: Cardiovascular;  Laterality: N/A;  . SHOULDER ARTHROSCOPY WITH ROTATOR CUFF REPAIR Right   . SHOULDER  OPEN ROTATOR CUFF REPAIR Left 01/2011   [name] 01/29/2011  . TOTAL ABDOMINAL HYSTERECTOMY    . TOTAL KNEE ARTHROPLASTY Left   . TOTAL KNEE ARTHROPLASTY Right 11/18/2012   Procedure: TOTAL KNEE ARTHROPLASTY;  Surgeon: Yvette Rack., MD;  Location: [place];  Service: Orthopedics;  Laterality: Right;  . TUBAL LIGATION    . UPPER GASTROINTESTINAL ENDOSCOPY    . UPPER GI ENDOSCOPY      There were no vitals filed for this visit.   Subjective Assessment - 03/01/20 1100    Subjective  [name] presents to OT for Rx visit 9/36 to address BLE lymphedema. [name] reports she is feeling better today than at last visit. She reports leg swelling is reduced compared with earlier in the week when she had longer span of time without compression wraps.    Pertinent History chronic BLE leg swelling and associated pain ~ 40 years; hx herniated intervertebral discL TKA; L total knee revision; HTN, Obesity. dementia; restless leg; CHF; unstable anina; R RC arthroplasty, OA, cervical spondylosis with myelopathy, chronic lower back pain, CKD (Stage?); Fibromyalgia; Hypothyroidism; OSA ( cPap?); hx pneumonia; RA, Type II Diabetes. S/P cardiac cathg, B THA, Lumbar laminectomy, posterior lumbar fusion, B shoulder arthroplasty; total abdominal hysterectomy    Limitations chronic leg swelling and associated pain, difficulty walking, impaired dynamic balance, muscle weakness,    Repetition Increases Symptoms    Special Tests + STEMMER sign base of toes bilaterally    Pain Onset --   s/p 44 yrs                               OT Education - 03/01/20 1101    Education Details [name] edu re precautions and benefits ofadvanced sequental pneumatic compression device (Flexitouch "pump") vs basic "pump. [name] would like to explore with trial.    Person(s) Educated Patient    Methods Explanation;Demonstration;Handout    Comprehension Verbalized understanding;Returned demonstration;Need further instruction                OT Long Term Goals - 02/09/20 1334      OT LONG TERM GOAL #1   Title [name] will be able to apply LLE multi-layer, short stretch compression wraps daily with maximum caregiver assistance, using correct gradient techniques independently to return affected limb/s, as closely as possible, to premorbid size and shape, to limit infection risk, and to improve safe functional mobility and ADLs performance.    Baseline dependent    Time 4    Period Days    Status New  Target Date --   4th OT Rx visit     OT LONG TERM GOAL #2   Title [name] will be able to verbalize signs and symptoms of cellulitis infection and identify 4 common lymphedema precautions using printed resource for reference (modified independence) to limit LE progression over time.    Baseline Max A    Time 4    Period Days    Status New    Target Date --   4th OT Rx visit     OT LONG TERM GOAL #3   Title [name] will sustain than 85% compliance with all daily LE self-care home program components throughout Intensive Phase CDT, including impeccable skin care, lymphatic pumping therex,  compression wraps and simple self MLD, to ensure optimal limb volume reduction, to limit infection risk and to limit LE progression.    Baseline dependent    Time 12    Period Weeks    Status New    Target Date 05/09/20      OT LONG TERM GOAL #4   Title [name] to achieve at least 10% LLE limb volume reduction during Intensive Phase CDT with Max CG assistance for wrapping, to improve functional performance of basic and instrumental ADLs, and to limit LE progression.    Baseline dependent    Time 12    Period Weeks    Status New      OT LONG TERM GOAL #5   Title During self-management phase of CDT [name] will retain limb volume reductions achieved during Intensive Phase CDT with no more than 3% volume increase using all LE self care components  to limit LE progression and further functional decline.    Baseline dependent    Time 6    Period Months     Status New    Target Date 08/07/20                 Plan - 03/01/20 1103    Clinical Impression Statement [name] tolerated MLD, skin care and compression wraps to LLE without increased pain. Limb mildly reduced today compared with last visit. It continues to be  concentrated from mid thigh to ankle. Flexi trial request sent  to manufacturer's rep via fax with demographics and referring MD info. Cont as per POC.           Patient will benefit from skilled therapeutic intervention in order to improve the following deficits and impairments:           Visit Diagnosis: Lymphedema, not elsewhere classified    Problem List Patient Active Problem List   Diagnosis Date Noted  . Iron deficiency 08/06/2018  . History of total left knee replacement (TKR) 07/18/2018  . Rectal bleeding 07/05/2018  . Hemorrhoids 07/05/2018  . Anal itching 07/05/2018  . Rotator cuff arthropathy of right shoulder   . Shoulder arthritis 06/15/2017  . Status post revision of total knee replacement, left 04/29/2017  . Polyethylene liner wear following left total knee arthroplasty requiring isolated polyethylene liner exchange ([place]) 03/02/2017  . 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 Unstable angina ([place]) 05/21/2016  . Chronic diastolic heart failure ([place]) 03/29/2016  . Normal coronary arteries 2009 01/14/2015  . Obesity-BMI 40 01/14/2015  . Restless leg 01/14/2015  . Chest pain 01/14/2015  . Back pain 01/14/2015  . Renal insufficiency 01/14/2015  . Dementia- mild memory issues 01/14/2015  . Occult blood positive stool 12/14/2014  . Anemia 12/14/2014  . Family history of  colon cancer 12/14/2014  . Change in bowel habits 12/14/2014  . GERD (gastroesophageal reflux disease) 12/14/2014  . Dyspnea 05/17/2012  . Lymphedema 05/17/2012  . Weight gain 05/17/2012  . HTN (hypertension) 05/17/2012    Andrey Spearman, MS, OTR/L, [facility] 03/01/20 11:06 AM   [place] MAIN [facility] SERVICES [address] [place], [place], [address] Phone: [phone]   Fax:  [phone]  Name: WREN PRYCE MRN: [id number] Date of Birth: Jan 21, 1946

## 2020-03-05 ENCOUNTER — Ambulatory Visit: Payer: Medicare Other | Admitting: Occupational Therapy

## 2020-03-05 ENCOUNTER — Other Ambulatory Visit: Payer: Self-pay

## 2020-03-05 DIAGNOSIS — I890 Lymphedema, not elsewhere classified: Secondary | ICD-10-CM

## 2020-03-05 NOTE — Therapy (Signed)
[place] MAIN [facility] SERVICES [address] [place], [place], [address] Phone: [phone]   Fax:  [phone]  Occupational Therapy Treatment  Note and Progress Report: Lymphedema Care  Patient Details  Name: [name] MRN: [id number] Date of Birth: [birth year] Referring Provider (OT): Juluis Pitch, MD   Encounter Date: 03/05/2020   OT End of Session - 03/05/20 1059    Visit Number 10    Number of Visits 36    Date for OT Re-Evaluation 05/09/20    OT Start Time 0806    OT Stop Time 0914    OT Time Calculation (min) 68 min    Activity Tolerance Patient tolerated treatment well;No increased pain    Behavior During Therapy WFL for tasks assessed/performed           Past Medical History:  Diagnosis Date  . Anemia   . Anxiety   . Arthritis    "hands, feet" (03/03/2017)  . Brain lesion    2 types  . Cataract   . Cervical spondylosis with myelopathy   . Chest pain    Normal cardiac cath 5/09  . Chronic lower back pain   . CKD (chronic kidney disease), stage III    [name]  . Congestive heart failure (CHF) ([place])    has a Cardiomems implant   . Constipation   . Dementia ([place])   . Depression   . Dumping syndrome   . Dyspnea    with activity  . Edema   . Fatigue   . Fibromyalgia   . GERD (gastroesophageal reflux disease)   . Headache    "w/high CBG" (03/03/2017)  . History of echocardiogram    a. Echo 03/20/16 (done at [facility] in [place], [place]):  mild LVH, EF 39%, normal diastolic function, mild LAE, MAC, RVSP 25 mmHg  . Hyperlipidemia   . Hypertension   . Hypothyroidism   . Lumbar spondylosis   . Lymphedema    seeing specialist for this  . Migraine    "mostly stopped when I changed my diet" (03/03/2017)  . OSA on CPAP   . Pneumonia X 1  . PONV (postoperative nausea and vomiting)   . Restless leg syndrome   . Rheumatoid arthritis ([place])   . Stroke [facility])    TIAs "mini strokes"- unsure of  last TIA - pt and daughter deny this  . Syncope   . Tremors of nervous system   . Type II diabetes mellitus ([place])     Past Surgical History:  Procedure Laterality Date  . APPENDECTOMY  1966  . BACK SURGERY    . CARDIAC CATHETERIZATION N/A 05/25/2016   Procedure: Right/Left Heart Cath and Coronary Angiography;  Surgeon: Larey Dresser, MD;  Location: [place] [facility];  Service: Cardiovascular;  Laterality: N/A;  . CATARACT EXTRACTION W/ INTRAOCULAR LENS  IMPLANT, BILATERAL Bilateral   . COLONOSCOPY    . CORONARY ANGIOGRAM  2009   Normal coronaries  . EXCISION/RELEASE BURSA HIP Bilateral   . I & D KNEE WITH POLY EXCHANGE Left 03/02/2017   Procedure: Left knee Revision of poly liner;  Surgeon: Mcarthur Rossetti, MD;  Location: [place];  Service: Orthopedics;  Laterality: Left;  . JOINT REPLACEMENT    . KNEE ARTHROSCOPY Bilateral   . LAPAROSCOPIC CHOLECYSTECTOMY  2015  . LMF  2017   CardiMEMS HF implant for CHF (measures amount of fluid in the heart)  . LUMBAR DISC SURGERY    .  LUMBAR LAMINECTOMY/DECOMPRESSION MICRODISCECTOMY Left 12/2005   L2-3 laminectomy and diskectomy/notes 01/06/2011  . POSTERIOR LUMBAR FUSION  04/2006   [name] 01/06/2011; "put cages in"  . REVERSE SHOULDER ARTHROPLASTY Left 06/15/2017   Procedure: REVERSE LEFT SHOULDER ARTHROPLASTY;  Surgeon: Meredith Pel, MD;  Location: [place];  Service: Orthopedics;  Laterality: Left;  . REVERSE SHOULDER ARTHROPLASTY Right 02/01/2018  . REVERSE SHOULDER ARTHROPLASTY Right 02/01/2018   Procedure: RIGHT REVERSE SHOULDER ARTHROPLASTY;  Surgeon: Meredith Pel, MD;  Location: [place];  Service: Orthopedics;  Laterality: Right;  . REVISION TOTAL KNEE ARTHROPLASTY Left 03/02/2017   poly liner/notes 03/02/2017  . RIGHT HEART CATH N/A 11/13/2016   Procedure: Right Heart Cath with Cardiomems;  Surgeon: Larey Dresser, MD;  Location: [place] [facility];  Service: Cardiovascular;  Laterality: N/A;  . SHOULDER ARTHROSCOPY WITH  ROTATOR CUFF REPAIR Right   . SHOULDER OPEN ROTATOR CUFF REPAIR Left 01/2011   [name] 01/29/2011  . TOTAL ABDOMINAL HYSTERECTOMY    . TOTAL KNEE ARTHROPLASTY Left   . TOTAL KNEE ARTHROPLASTY Right 11/18/2012   Procedure: TOTAL KNEE ARTHROPLASTY;  Surgeon: Yvette Rack., MD;  Location: [place];  Service: Orthopedics;  Laterality: Right;  . TUBAL LIGATION    . UPPER GASTROINTESTINAL ENDOSCOPY    . UPPER GI ENDOSCOPY      There were no vitals filed for this visit.   Subjective Assessment - 03/05/20 0817    Subjective  Mrs Bourquin presents to OT for Rx visit 10/36 to address BLE lymphedema. Pt reports RLE "feels like it's going to bust." It's about a 5/10.    Pertinent History chronic BLE leg swelling and associated pain ~ 40 years; hx herniated intervertebral discL TKA; L total knee revision; HTN, Obesity. dementia; restless leg; CHF; unstable anina; R RC arthroplasty, OA, cervical spondylosis with myelopathy, chronic lower back pain, CKD (Stage?); Fibromyalgia; Hypothyroidism; OSA ( cPap?); hx pneumonia; RA, Type II Diabetes. S/P cardiac cathg, B THA, Lumbar laminectomy, posterior lumbar fusion, B shoulder arthroplasty; total abdominal hysterectomy    Limitations chronic leg swelling and associated pain, difficulty walking, impaired dynamic balance, muscle weakness,    Repetition Increases Symptoms    Special Tests + STEMMER sign base of toes bilaterally    Pain Onset --   s/p 44 yrs              LYMPHEDEMA/ONCOLOGY QUESTIONNAIRE - 03/05/20 0001      Left Lower Extremity Lymphedema   Other LLE A-D= 9539.7 ml. LLE E-G= 8181.40m. LLE A-G = 17721.4 ml.     Other LLE leg  (A-D) volume reduction measures 1.81 %, thigh reduction measures 21.83%, and full limb volume reduction measures 12.19%                   OT Treatments/Exercises (OP) - 03/05/20 0001      ADLs   ADL Education Given Yes      Manual Therapy   Manual Therapy Edema management;Manual Lymphatic Drainage  (MLD);Compression Bandaging    Edema Management LLE comparative limb volumetrics    Manual Lymphatic Drainage (MLD)  LLE MLD to LLE/LLQ utilizing modified "short neck sequence" with no strokes to neck in keeping w' LE thyroid precautions. UTILIZED DEEP ABDOMINAL PATHWAYS VIS DIAPHRAGMATIC BREATHING, FUNCTIONAL INGUINAL ln, AND PROGIDED DYNAMIC STROKES TO LEG IN SEGMENTS FROM PROXIMAL TO DISTAL. eXCELLENT TOLERANCE.    Compression Bandaging LLE thigh length gradient compression wraps using short stretch bandages. as established  OT Education - 03/05/20 1059    Education Details Continued skilled Pt/caregiver education  And LE ADL training throughout visit for lymphedema self care/ home program, including compression wrapping, compression garment and device wear/care, lymphatic pumping ther ex, simple self-MLD, and skin care. Discussed progress towards goals.    Person(s) Educated Patient    Methods Explanation;Demonstration;Handout    Comprehension Verbalized understanding;Returned demonstration;Need further instruction               OT Long Term Goals - 03/05/20 1102      OT LONG TERM GOAL #1   Title Pt will be able to apply LLE multi-layer, short stretch compression wraps daily with maximum caregiver assistance, using correct gradient techniques independently to return affected limb/s, as closely as possible, to premorbid size and shape, to limit infection risk, and to improve safe functional mobility and ADLs performance.    Baseline dependent    Time 4    Period Days    Status Revised   Pt has no assistance for wraps at nursing facility. Goal updated to compression wrap tolerance for 23/ 7 during Intensive Phase CDT.     OT LONG TERM GOAL #2   Title Pt will be able to verbalize signs and symptoms of cellulitis infection and identify 4 common lymphedema precautions using printed resource for reference (modified independence) to limit LE progression over time.      Baseline Max A    Time 4    Period Days    Status Partially Met      OT LONG TERM GOAL #3   Title Pt will sustain than 85% compliance with all daily LE self-care home program components throughout Intensive Phase CDT, including impeccable skin care, lymphatic pumping therex,  compression wraps and simple self MLD, to ensure optimal limb volume reduction, to limit infection risk and to limit LE progression.    Baseline dependent    Time 12    Period Weeks    Status Partially Met   Pt is not leaving wraps in place 23/7 as directed at this point. This is partially due to bathing schedule at nursing home, and lack of assistance for reapplying wraps during visit intervals.     OT LONG TERM GOAL #4   Title Pt to achieve at least 10% LLE limb volume reduction during Intensive Phase CDT with Max CG assistance for wrapping, to improve functional performance of basic and instrumental ADLs, and to limit LE progression.    Baseline dependent    Time 12    Period Weeks    Status Partially Met   Met for LLE with A-G reduction of 12/19% today. New Goal 15% bilaterally     OT LONG TERM GOAL #5   Title During self-management phase of CDT Pt will retain limb volume reductions achieved during Intensive Phase CDT with no more than 3% volume increase using all LE self care components  to limit LE progression and further functional decline.    Baseline dependent    Time 6    Period Months    Status On-going                 Plan - 03/05/20 1100    Clinical Impression Statement LLE comparative limb volumetrics reveals leg  (A-D) volume reduction measures 1.81 %, thigh reduction measures 21.83%, and full limb volume reduction measures 12.19%. GOAL MET! for LLE.  New goal going forward is 15% reduction for full leg volume. Refer to lONG tERM gOAL  SECTIONS FOR DETAILS OF PROGRESS TOWARDS ALL ot GOALS FOR LYMPHEDEMA CARE. cONT AS PER poc>           Patient will benefit from skilled therapeutic  intervention in order to improve the following deficits and impairments:           Visit Diagnosis: Lymphedema, not elsewhere classified    Problem List Patient Active Problem List   Diagnosis Date Noted  . Iron deficiency 08/06/2018  . History of total left knee replacement (TKR) 07/18/2018  . Rectal bleeding 07/05/2018  . Hemorrhoids 07/05/2018  . Anal itching 07/05/2018  . Rotator cuff arthropathy of right shoulder   . Shoulder arthritis 06/15/2017  . Status post revision of total knee replacement, left 04/29/2017  . Polyethylene liner wear following left total knee arthroplasty requiring isolated polyethylene liner exchange ([place]) 03/02/2017  . 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 Unstable angina ([place]) 05/21/2016  . Chronic diastolic heart failure ([place]) 03/29/2016  . Normal coronary arteries 2009 01/14/2015  . Obesity-BMI 40 01/14/2015  . Restless leg 01/14/2015  . Chest pain 01/14/2015  . Back pain 01/14/2015  . Renal insufficiency 01/14/2015  . Dementia- mild memory issues 01/14/2015  . Occult blood positive stool 12/14/2014  . Anemia 12/14/2014  . Family history of colon cancer 12/14/2014  . Change in bowel habits 12/14/2014  . GERD (gastroesophageal reflux disease) 12/14/2014  . Dyspnea 05/17/2012  . Lymphedema 05/17/2012  . Weight gain 05/17/2012  . HTN (hypertension) 05/17/2012   Andrey Spearman, MS, OTR/L, [facility] 03/05/20 11:09 AM   [place] MAIN [facility] SERVICES [address] [place], [place], [address] Phone: [phone]   Fax:  [phone]  Name: [name] MRN: [id number] Date of Birth: [birth year]

## 2020-03-07 ENCOUNTER — Other Ambulatory Visit: Payer: Self-pay

## 2020-03-07 ENCOUNTER — Ambulatory Visit: Payer: Medicare Other | Admitting: Occupational Therapy

## 2020-03-07 DIAGNOSIS — I890 Lymphedema, not elsewhere classified: Secondary | ICD-10-CM | POA: Diagnosis not present

## 2020-03-07 NOTE — Therapy (Signed)
[place] MAIN [facility] SERVICES [address] [place], [place], [address] Phone: [phone]   Fax:  [phone]  Occupational Therapy Treatment  Patient Details  Name: [name] MRN: [id number] Date of Birth: 04/25/1946 Referring Provider (OT): Juluis Pitch, MD   Encounter Date: 03/07/2020   OT End of Session - 03/07/20 1018    Visit Number 11    Number of Visits 36    Date for OT Re-Evaluation 05/09/20    OT Start Time 0900    OT Stop Time 1010    OT Time Calculation (min) 70 min    Activity Tolerance Patient tolerated treatment well;No increased pain    Behavior During Therapy WFL for tasks assessed/performed           Past Medical History:  Diagnosis Date  . Anemia   . Anxiety   . Arthritis    "hands, feet" (03/03/2017)  . Brain lesion    2 types  . Cataract   . Cervical spondylosis with myelopathy   . Chest pain    Normal cardiac cath 5/09  . Chronic lower back pain   . CKD (chronic kidney disease), stage III    [name]  . Congestive heart failure (CHF) ([place])    has a Cardiomems implant   . Constipation   . Dementia ([place])   . Depression   . Dumping syndrome   . Dyspnea    with activity  . Edema   . Fatigue   . Fibromyalgia   . GERD (gastroesophageal reflux disease)   . Headache    "w/high CBG" (03/03/2017)  . History of echocardiogram    a. Echo 03/20/16 (done at [facility] in [place], [place]):  mild LVH, EF 83%, normal diastolic function, mild LAE, MAC, RVSP 25 mmHg  . Hyperlipidemia   . Hypertension   . Hypothyroidism   . Lumbar spondylosis   . Lymphedema    seeing specialist for this  . Migraine    "mostly stopped when I changed my diet" (03/03/2017)  . OSA on CPAP   . Pneumonia X 1  . PONV (postoperative nausea and vomiting)   . Restless leg syndrome   . Rheumatoid arthritis ([place])   . Stroke [facility])    TIAs "mini strokes"- unsure of last TIA - pt and daughter deny this  .  Syncope   . Tremors of nervous system   . Type II diabetes mellitus ([place])     Past Surgical History:  Procedure Laterality Date  . APPENDECTOMY  1966  . BACK SURGERY    . CARDIAC CATHETERIZATION N/A 05/25/2016   Procedure: Right/Left Heart Cath and Coronary Angiography;  Surgeon: Larey Dresser, MD;  Location: [place] [facility];  Service: Cardiovascular;  Laterality: N/A;  . CATARACT EXTRACTION W/ INTRAOCULAR LENS  IMPLANT, BILATERAL Bilateral   . COLONOSCOPY    . CORONARY ANGIOGRAM  2009   Normal coronaries  . EXCISION/RELEASE BURSA HIP Bilateral   . I & D KNEE WITH POLY EXCHANGE Left 03/02/2017   Procedure: Left knee Revision of poly liner;  Surgeon: Mcarthur Rossetti, MD;  Location: [place];  Service: Orthopedics;  Laterality: Left;  . JOINT REPLACEMENT    . KNEE ARTHROSCOPY Bilateral   . LAPAROSCOPIC CHOLECYSTECTOMY  2015  . LMF  2017   CardiMEMS HF implant for CHF (measures amount of fluid in the heart)  . LUMBAR DISC SURGERY    . LUMBAR LAMINECTOMY/DECOMPRESSION MICRODISCECTOMY Left  12/2005   L2-3 laminectomy and diskectomy/notes 01/06/2011  . POSTERIOR LUMBAR FUSION  04/2006   [name] 01/06/2011; "put cages in"  . REVERSE SHOULDER ARTHROPLASTY Left 06/15/2017   Procedure: REVERSE LEFT SHOULDER ARTHROPLASTY;  Surgeon: Meredith Pel, MD;  Location: [place];  Service: Orthopedics;  Laterality: Left;  . REVERSE SHOULDER ARTHROPLASTY Right 02/01/2018  . REVERSE SHOULDER ARTHROPLASTY Right 02/01/2018   Procedure: RIGHT REVERSE SHOULDER ARTHROPLASTY;  Surgeon: Meredith Pel, MD;  Location: [place];  Service: Orthopedics;  Laterality: Right;  . REVISION TOTAL KNEE ARTHROPLASTY Left 03/02/2017   poly liner/notes 03/02/2017  . RIGHT HEART CATH N/A 11/13/2016   Procedure: Right Heart Cath with Cardiomems;  Surgeon: Larey Dresser, MD;  Location: [place] [facility];  Service: Cardiovascular;  Laterality: N/A;  . SHOULDER ARTHROSCOPY WITH ROTATOR CUFF REPAIR Right   . SHOULDER  OPEN ROTATOR CUFF REPAIR Left 01/2011   [name] 01/29/2011  . TOTAL ABDOMINAL HYSTERECTOMY    . TOTAL KNEE ARTHROPLASTY Left   . TOTAL KNEE ARTHROPLASTY Right 11/18/2012   Procedure: TOTAL KNEE ARTHROPLASTY;  Surgeon: Yvette Rack., MD;  Location: [place];  Service: Orthopedics;  Laterality: Right;  . TUBAL LIGATION    . UPPER GASTROINTESTINAL ENDOSCOPY    . UPPER GI ENDOSCOPY      There were no vitals filed for this visit.   Subjective Assessment - 03/07/20 0908    Subjective  [name] presents to OT for Rx visit 11/36 to address BLE lymphedema. Pt reports pain in RLE is 4-5/10. RLE presenting today with area of well defined redness and increased skin temperature anterior medial leg. Distally reddened area appearing as rash with blisters forming also observed.    Pertinent History chronic BLE leg swelling and associated pain ~ 40 years; hx herniated intervertebral discL TKA; L total knee revision; HTN, Obesity. dementia; restless leg; CHF; unstable anina; R RC arthroplasty, OA, cervical spondylosis with myelopathy, chronic lower back pain, CKD (Stage?); Fibromyalgia; Hypothyroidism; OSA ( cPap?); hx pneumonia; RA, Type II Diabetes. S/P cardiac cathg, B THA, Lumbar laminectomy, posterior lumbar fusion, B shoulder arthroplasty; total abdominal hysterectomy    Limitations chronic leg swelling and associated pain, difficulty walking, impaired dynamic balance, muscle weakness,    Repetition Increases Symptoms    Special Tests + STEMMER sign base of toes bilaterally    Pain Onset --   s/p 45 yrs                       OT Treatments/Exercises (OP) - 03/07/20 0001      ADLs   ADL Education Given Yes      Manual Therapy   Manual Therapy Edema management;Compression Bandaging;Manual Lymphatic Drainage (MLD)    Manual Lymphatic Drainage (MLD)  LLE MLD to LLE/LLQ utilizing modified "short neck sequence" with no strokes to neck in keeping w' LE thyroid precautions. UTILIZED DEEP ABDOMINAL  PATHWAYS VIS DIAPHRAGMATIC BREATHING, FUNCTIONAL INGUINAL ln, AND PROGIDED DYNAMIC STROKES TO LEG IN SEGMENTS FROM PROXIMAL TO DISTAL. eXCELLENT TOLERANCE.    Compression Bandaging LLE thigh length gradient compression wraps using short stretch bandages. as established                  OT Education - 03/07/20 0909    Education Details Pt edu for cellulitis precautions    Person(s) Educated Patient    Methods Explanation;Demonstration;Handout    Comprehension Verbalized understanding;Returned demonstration;Need further instruction  OT Long Term Goals - 03/05/20 1102      OT LONG TERM GOAL #1   Title Pt will be able to apply LLE multi-layer, short stretch compression wraps daily with maximum caregiver assistance, using correct gradient techniques independently to return affected limb/s, as closely as possible, to premorbid size and shape, to limit infection risk, and to improve safe functional mobility and ADLs performance.    Baseline dependent    Time 4    Period Days    Status Revised   Pt has no assistance for wraps at nursing facility. Goal updated to compression wrap tolerance for 23/ 7 during Intensive Phase CDT.     OT LONG TERM GOAL #2   Title Pt will be able to verbalize signs and symptoms of cellulitis infection and identify 4 common lymphedema precautions using printed resource for reference (modified independence) to limit LE progression over time.    Baseline Max A    Time 4    Period Days    Status Partially Met      OT LONG TERM GOAL #3   Title Pt will sustain than 85% compliance with all daily LE self-care home program components throughout Intensive Phase CDT, including impeccable skin care, lymphatic pumping therex,  compression wraps and simple self MLD, to ensure optimal limb volume reduction, to limit infection risk and to limit LE progression.    Baseline dependent    Time 12    Period Weeks    Status Partially Met   Pt is not leaving  wraps in place 23/7 as directed at this point. This is partially due to bathing schedule at nursing home, and lack of assistance for reapplying wraps during visit intervals.     OT LONG TERM GOAL #4   Title Pt to achieve at least 10% LLE limb volume reduction during Intensive Phase CDT with Max CG assistance for wrapping, to improve functional performance of basic and instrumental ADLs, and to limit LE progression.    Baseline dependent    Time 12    Period Weeks    Status Partially Met   Met for LLE with A-G reduction of 12/19% today. New Goal 15% bilaterally     OT LONG TERM GOAL #5   Title During self-management phase of CDT Pt will retain limb volume reductions achieved during Intensive Phase CDT with no more than 3% volume increase using all LE self care components  to limit LE progression and further functional decline.    Baseline dependent    Time 6    Period Months    Status On-going                 Plan - 03/07/20 1020    Clinical Impression Statement Pt presenting with what appears to be RLE cellulitis infection below the knee.  RLE swelling is increased, skin temperature is increased and redness is noted medial laterally and distally below the R knee. Pt should be seen by a physician immediately for diagnosis and treatment to limit progression, which can result in hospitalization and serious illness. BLE should be washefd and low pH lotion applied nightly to restore acidic matric on skin and limit infection as people with lymphedema are at high risk of infection. Pt tolerated LLE MLD and compression wraps as established today. Cont as per POC.           Patient will benefit from skilled therapeutic intervention in order to improve the following deficits and impairments:  Visit Diagnosis: Lymphedema, not elsewhere classified    Problem List Patient Active Problem List   Diagnosis Date Noted  . Iron deficiency 08/06/2018  . History of total left knee  replacement (TKR) 07/18/2018  . Rectal bleeding 07/05/2018  . Hemorrhoids 07/05/2018  . Anal itching 07/05/2018  . Rotator cuff arthropathy of right shoulder   . Shoulder arthritis 06/15/2017  . Status post revision of total knee replacement, left 04/29/2017  . Polyethylene liner wear following left total knee arthroplasty requiring isolated polyethylene liner exchange ([place]) 03/02/2017  . 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 Unstable angina ([place]) 05/21/2016  . Chronic diastolic heart failure ([place]) 03/29/2016  . Normal coronary arteries 2009 01/14/2015  . Obesity-BMI 40 01/14/2015  . Restless leg 01/14/2015  . Chest pain 01/14/2015  . Back pain 01/14/2015  . Renal insufficiency 01/14/2015  . Dementia- mild memory issues 01/14/2015  . Occult blood positive stool 12/14/2014  . Anemia 12/14/2014  . Family history of colon cancer 12/14/2014  . Change in bowel habits 12/14/2014  . GERD (gastroesophageal reflux disease) 12/14/2014  . Dyspnea 05/17/2012  . Lymphedema 05/17/2012  . Weight gain 05/17/2012  . HTN (hypertension) 05/17/2012    Andrey Spearman, MS, OTR/L, [facility] 03/07/20 10:26 AM   [place] MAIN [facility] SERVICES [address] [place], [place], [address] Phone: [phone]   Fax:  [phone]  Name: ELISSA GRIESHOP MRN: [id number] Date of Birth: 01-12-1946

## 2020-03-08 ENCOUNTER — Ambulatory Visit: Payer: Medicare Other | Admitting: Occupational Therapy

## 2020-03-08 ENCOUNTER — Other Ambulatory Visit: Payer: Self-pay

## 2020-03-08 DIAGNOSIS — I890 Lymphedema, not elsewhere classified: Secondary | ICD-10-CM

## 2020-03-08 NOTE — Therapy (Signed)
[place] MAIN [facility] SERVICES [address] [place], [place], [address] Phone: [phone]   Fax:  [phone]  Occupational Therapy Treatment  Patient Details  Name: [name] MRN: [id number] Date of Birth: October 08, 1945 Referring Provider (OT): [name] Pitch, MD   Encounter Date: 03/08/2020   OT End of Session - 03/08/20 1053    Visit Number 12    Number of Visits 36    Date for OT Re-Evaluation 05/09/20    OT Start Time 0805    OT Stop Time 0935    OT Time Calculation (min) 90 min    Activity Tolerance Patient tolerated treatment well;No increased pain    Behavior During Therapy WFL for tasks assessed/performed           Past Medical History:  Diagnosis Date  . Anemia   . Anxiety   . Arthritis    "hands, feet" (03/03/2017)  . Brain lesion    2 types  . Cataract   . Cervical spondylosis with myelopathy   . Chest pain    Normal cardiac cath 5/09  . Chronic lower back pain   . CKD (chronic kidney disease), stage III    [name]  . Congestive heart failure (CHF) ([place])    has a Cardiomems implant   . Constipation   . Dementia ([place])   . Depression   . Dumping syndrome   . Dyspnea    with activity  . Edema   . Fatigue   . Fibromyalgia   . GERD (gastroesophageal reflux disease)   . Headache    "w/high CBG" (03/03/2017)  . History of echocardiogram    a. Echo 03/20/16 (done at [facility] in [place], [place]):  mild LVH, EF 46%, normal diastolic function, mild LAE, MAC, RVSP 25 mmHg  . Hyperlipidemia   . Hypertension   . Hypothyroidism   . Lumbar spondylosis   . Lymphedema    seeing specialist for this  . Migraine    "mostly stopped when I changed my diet" (03/03/2017)  . OSA on CPAP   . Pneumonia X 1  . PONV (postoperative nausea and vomiting)   . Restless leg syndrome   . Rheumatoid arthritis ([place])   . Stroke [facility])    TIAs "mini strokes"- unsure of last TIA - pt and daughter deny this  .  Syncope   . Tremors of nervous system   . Type II diabetes mellitus ([place])     Past Surgical History:  Procedure Laterality Date  . APPENDECTOMY  1966  . BACK SURGERY    . CARDIAC CATHETERIZATION N/A 05/25/2016   Procedure: Right/Left Heart Cath and Coronary Angiography;  Surgeon: Larey Dresser, MD;  Location: [place] [facility];  Service: Cardiovascular;  Laterality: N/A;  . CATARACT EXTRACTION W/ INTRAOCULAR LENS  IMPLANT, BILATERAL Bilateral   . COLONOSCOPY    . CORONARY ANGIOGRAM  2009   Normal coronaries  . EXCISION/RELEASE BURSA HIP Bilateral   . I & D KNEE WITH POLY EXCHANGE Left 03/02/2017   Procedure: Left knee Revision of poly liner;  Surgeon: Mcarthur Rossetti, MD;  Location: [place];  Service: Orthopedics;  Laterality: Left;  . JOINT REPLACEMENT    . KNEE ARTHROSCOPY Bilateral   . LAPAROSCOPIC CHOLECYSTECTOMY  2015  . LMF  2017   CardiMEMS HF implant for CHF (measures amount of fluid in the heart)  . LUMBAR DISC SURGERY    . LUMBAR LAMINECTOMY/DECOMPRESSION MICRODISCECTOMY Left  12/2005   L2-3 laminectomy and diskectomy/notes 01/06/2011  . POSTERIOR LUMBAR FUSION  04/2006   [name] 01/06/2011; "put cages in"  . REVERSE SHOULDER ARTHROPLASTY Left 06/15/2017   Procedure: REVERSE LEFT SHOULDER ARTHROPLASTY;  Surgeon: Meredith Pel, MD;  Location: [place];  Service: Orthopedics;  Laterality: Left;  . REVERSE SHOULDER ARTHROPLASTY Right 02/01/2018  . REVERSE SHOULDER ARTHROPLASTY Right 02/01/2018   Procedure: RIGHT REVERSE SHOULDER ARTHROPLASTY;  Surgeon: Meredith Pel, MD;  Location: [place];  Service: Orthopedics;  Laterality: Right;  . REVISION TOTAL KNEE ARTHROPLASTY Left 03/02/2017   poly liner/notes 03/02/2017  . RIGHT HEART CATH N/A 11/13/2016   Procedure: Right Heart Cath with Cardiomems;  Surgeon: Larey Dresser, MD;  Location: [place] [facility];  Service: Cardiovascular;  Laterality: N/A;  . SHOULDER ARTHROSCOPY WITH ROTATOR CUFF REPAIR Right   . SHOULDER  OPEN ROTATOR CUFF REPAIR Left 01/2011   [name] 01/29/2011  . TOTAL ABDOMINAL HYSTERECTOMY    . TOTAL KNEE ARTHROPLASTY Left   . TOTAL KNEE ARTHROPLASTY Right 11/18/2012   Procedure: TOTAL KNEE ARTHROPLASTY;  Surgeon: Yvette Rack., MD;  Location: [place];  Service: Orthopedics;  Laterality: Right;  . TUBAL LIGATION    . UPPER GASTROINTESTINAL ENDOSCOPY    . UPPER GI ENDOSCOPY      There were no vitals filed for this visit.   Subjective Assessment - 03/08/20 0820    Subjective  [name] presents to OT for Rx visit 12/36 to address BLE lymphedema. Pt reports pain in RLE is 5/10. Pt reports that no one at her facility examined area of redness suspected to be emerging cellultis infection.    Pertinent History chronic BLE leg swelling and associated pain ~ 40 years; hx herniated intervertebral discL TKA; L total knee revision; HTN, Obesity. dementia; restless leg; CHF; unstable anina; R RC arthroplasty, OA, cervical spondylosis with myelopathy, chronic lower back pain, CKD (Stage?); Fibromyalgia; Hypothyroidism; OSA ( cPap?); hx pneumonia; RA, Type II Diabetes. S/P cardiac cathg, B THA, Lumbar laminectomy, posterior lumbar fusion, B shoulder arthroplasty; total abdominal hysterectomy    Limitations chronic leg swelling and associated pain, difficulty walking, impaired dynamic balance, muscle weakness,    Repetition Increases Symptoms    Special Tests + STEMMER sign base of toes bilaterally    Pain Onset --   s/p 45 yrs                       OT Treatments/Exercises (OP) - 03/08/20 0001      ADLs   ADL Education Given Yes      Manual Therapy   Manual Therapy Edema management;Manual Lymphatic Drainage (MLD);Compression Bandaging    Manual Lymphatic Drainage (MLD)  LLE MLD to LLE/LLQ utilizing modified "short neck sequence" with no strokes to neck in keeping w' LE thyroid precautions. UTILIZED DEEP ABDOMINAL PATHWAYS VIS DIAPHRAGMATIC BREATHING, FUNCTIONAL INGUINAL ln, AND PROGIDED  DYNAMIC STROKES TO LEG IN SEGMENTS FROM PROXIMAL TO DISTAL. eXCELLENT TOLERANCE.    Compression Bandaging LLE thigh length gradient compression wraps using short stretch bandages. as established                  OT Education - 03/08/20 [phone]    Education Details Continued skilled Pt/caregiver education  And LE ADL training throughout visit for lymphedema self care/ home program, including compression wrapping, compression garment and device wear/care, lymphatic pumping ther ex, simple self-MLD, and skin care. Discussed progress towards goals.    Person(s) Educated  Patient    Methods Explanation;Demonstration;Handout    Comprehension Verbalized understanding;Returned demonstration;Need further instruction               OT Long Term Goals - 03/08/20 1101      OT LONG TERM GOAL #1   Title Pt will be able to apply LLE multi-layer, short stretch compression wraps daily with maximum caregiver assistance, using correct gradient techniques independently to return affected limb/s, as closely as possible, to premorbid size and shape, to limit infection risk, and to improve safe functional mobility and ADLs performance.    Baseline dependent    Time 4    Period Days    Status Revised   Pt has no assistance for wraps at nursing facility. Goal updated to compression wrap tolerance for 23/ 7 during Intensive Phase CDT.     OT LONG TERM GOAL #2   Title Pt will be able to verbalize signs and symptoms of cellulitis infection and identify 4 common lymphedema precautions using printed resource for reference (modified independence) to limit LE progression over time.    Baseline Max A    Time 4    Period Days    Status Achieved      OT LONG TERM GOAL #3   Title Pt will sustain than 85% compliance with all daily LE self-care home program components throughout Intensive Phase CDT, including impeccable skin care, lymphatic pumping therex,  compression wraps and simple self MLD, to ensure optimal  limb volume reduction, to limit infection risk and to limit LE progression.    Baseline dependent    Time 12    Period Weeks    Status Partially Met   Pt is not leaving wraps in place 23/7 as directed at this point. This is partially due to bathing schedule at nursing home, and lack of assistance for reapplying wraps during visit intervals.     OT LONG TERM GOAL #4   Title Pt to achieve at least 10% LLE limb volume reduction during Intensive Phase CDT with Max CG assistance for wrapping, to improve functional performance of basic and instrumental ADLs, and to limit LE progression.    Baseline dependent    Time 12    Period Weeks    Status Partially Met   Met for LLE with A-G reduction of 12/19% today. New Goal 15% bilaterally     OT LONG TERM GOAL #5   Title During self-management phase of CDT Pt will retain limb volume reductions achieved during Intensive Phase CDT with no more than 3% volume increase using all LE self care components  to limit LE progression and further functional decline.    Baseline dependent    Time 6    Period Months    Status On-going                 Plan - 03/08/20 1057    Clinical Impression Statement RLE redness and increased skin temperature at anterior medial thigh and distal keg unchanged this morning. Redness does not appear to have moved beyond borders drawn on skin at yesterday's visit. Pt reports no one at her residential facility has examined it despite our request yesterday. Pt tolerated LLE MLD, skin care , and thigh length compression wraps without increased pain. Limb volume continues to reduce as Pt demonstrating excellent response to CDT. Cont as per POC.    Rehab Potential Good           Patient will benefit from skilled therapeutic intervention  in order to improve the following deficits and impairments:           Visit Diagnosis: Lymphedema, not elsewhere classified    Problem List Patient Active Problem List   Diagnosis  Date Noted  . Iron deficiency 08/06/2018  . History of total left knee replacement (TKR) 07/18/2018  . Rectal bleeding 07/05/2018  . Hemorrhoids 07/05/2018  . Anal itching 07/05/2018  . Rotator cuff arthropathy of right shoulder   . Shoulder arthritis 06/15/2017  . Status post revision of total knee replacement, left 04/29/2017  . Polyethylene liner wear following left total knee arthroplasty requiring isolated polyethylene liner exchange ([place]) 03/02/2017  . 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 Unstable angina ([place]) 05/21/2016  . Chronic diastolic heart failure ([place]) 03/29/2016  . Normal coronary arteries 2009 01/14/2015  . Obesity-BMI 40 01/14/2015  . Restless leg 01/14/2015  . Chest pain 01/14/2015  . Back pain 01/14/2015  . Renal insufficiency 01/14/2015  . Dementia- mild memory issues 01/14/2015  . Occult blood positive stool 12/14/2014  . Anemia 12/14/2014  . Family history of colon cancer 12/14/2014  . Change in bowel habits 12/14/2014  . GERD (gastroesophageal reflux disease) 12/14/2014  . Dyspnea 05/17/2012  . Lymphedema 05/17/2012  . Weight gain 05/17/2012  . HTN (hypertension) 05/17/2012   Andrey Spearman, MS, OTR/L, [facility] 03/08/20 11:02 AM   [place] MAIN [facility] SERVICES [address] [place], [place], [address] Phone: [phone]   Fax:  [phone]  Name: [name] MRN: [id number] Date of Birth: [birth year]

## 2020-03-12 ENCOUNTER — Other Ambulatory Visit: Payer: Self-pay

## 2020-03-12 ENCOUNTER — Ambulatory Visit: Payer: Medicare Other | Admitting: Occupational Therapy

## 2020-03-12 DIAGNOSIS — I890 Lymphedema, not elsewhere classified: Secondary | ICD-10-CM

## 2020-03-12 NOTE — Therapy (Signed)
[place] MAIN [facility] SERVICES [address] [place], [place], [address] Phone: [phone]   Fax:  [phone]  Occupational Therapy Treatment  Patient Details  Name: [name] MRN: [id number] Date of Birth: 1946/03/24 Referring Provider (OT): Juluis Pitch, MD   Encounter Date: 03/12/2020   OT End of Session - 03/12/20 1217    Visit Number 13    Number of Visits 36    Date for OT Re-Evaluation 05/09/20    OT Start Time 0811    OT Stop Time 0910    OT Time Calculation (min) 59 min    Activity Tolerance Patient tolerated treatment well;No increased pain    Behavior During Therapy WFL for tasks assessed/performed           Past Medical History:  Diagnosis Date  . Anemia   . Anxiety   . Arthritis    "hands, feet" (03/03/2017)  . Brain lesion    2 types  . Cataract   . Cervical spondylosis with myelopathy   . Chest pain    Normal cardiac cath 5/09  . Chronic lower back pain   . CKD (chronic kidney disease), stage III    [name]  . Congestive heart failure (CHF) ([place])    has a Cardiomems implant   . Constipation   . Dementia ([place])   . Depression   . Dumping syndrome   . Dyspnea    with activity  . Edema   . Fatigue   . Fibromyalgia   . GERD (gastroesophageal reflux disease)   . Headache    "w/high CBG" (03/03/2017)  . History of echocardiogram    a. Echo 03/20/16 (done at [facility] in [place], [place]):  mild LVH, EF 70%, normal diastolic function, mild LAE, MAC, RVSP 25 mmHg  . Hyperlipidemia   . Hypertension   . Hypothyroidism   . Lumbar spondylosis   . Lymphedema    seeing specialist for this  . Migraine    "mostly stopped when I changed my diet" (03/03/2017)  . OSA on CPAP   . Pneumonia X 1  . PONV (postoperative nausea and vomiting)   . Restless leg syndrome   . Rheumatoid arthritis ([place])   . Stroke [facility])    TIAs "mini strokes"- unsure of last TIA - pt and daughter deny this  .  Syncope   . Tremors of nervous system   . Type II diabetes mellitus ([place])     Past Surgical History:  Procedure Laterality Date  . APPENDECTOMY  1966  . BACK SURGERY    . CARDIAC CATHETERIZATION N/A 05/25/2016   Procedure: Right/Left Heart Cath and Coronary Angiography;  Surgeon: Larey Dresser, MD;  Location: [place] [facility];  Service: Cardiovascular;  Laterality: N/A;  . CATARACT EXTRACTION W/ INTRAOCULAR LENS  IMPLANT, BILATERAL Bilateral   . COLONOSCOPY    . CORONARY ANGIOGRAM  2009   Normal coronaries  . EXCISION/RELEASE BURSA HIP Bilateral   . I & D KNEE WITH POLY EXCHANGE Left 03/02/2017   Procedure: Left knee Revision of poly liner;  Surgeon: Mcarthur Rossetti, MD;  Location: [place];  Service: Orthopedics;  Laterality: Left;  . JOINT REPLACEMENT    . KNEE ARTHROSCOPY Bilateral   . LAPAROSCOPIC CHOLECYSTECTOMY  2015  . LMF  2017   CardiMEMS HF implant for CHF (measures amount of fluid in the heart)  . LUMBAR DISC SURGERY    . LUMBAR LAMINECTOMY/DECOMPRESSION MICRODISCECTOMY Left  12/2005   L2-3 laminectomy and diskectomy/notes 01/06/2011  . POSTERIOR LUMBAR FUSION  04/2006   [name] 01/06/2011; "put cages in"  . REVERSE SHOULDER ARTHROPLASTY Left 06/15/2017   Procedure: REVERSE LEFT SHOULDER ARTHROPLASTY;  Surgeon: Meredith Pel, MD;  Location: [place];  Service: Orthopedics;  Laterality: Left;  . REVERSE SHOULDER ARTHROPLASTY Right 02/01/2018  . REVERSE SHOULDER ARTHROPLASTY Right 02/01/2018   Procedure: RIGHT REVERSE SHOULDER ARTHROPLASTY;  Surgeon: Meredith Pel, MD;  Location: [place];  Service: Orthopedics;  Laterality: Right;  . REVISION TOTAL KNEE ARTHROPLASTY Left 03/02/2017   poly liner/notes 03/02/2017  . RIGHT HEART CATH N/A 11/13/2016   Procedure: Right Heart Cath with Cardiomems;  Surgeon: Larey Dresser, MD;  Location: [place] [facility];  Service: Cardiovascular;  Laterality: N/A;  . SHOULDER ARTHROSCOPY WITH ROTATOR CUFF REPAIR Right   . SHOULDER  OPEN ROTATOR CUFF REPAIR Left 01/2011   [name] 01/29/2011  . TOTAL ABDOMINAL HYSTERECTOMY    . TOTAL KNEE ARTHROPLASTY Left   . TOTAL KNEE ARTHROPLASTY Right 11/18/2012   Procedure: TOTAL KNEE ARTHROPLASTY;  Surgeon: Yvette Rack., MD;  Location: [place];  Service: Orthopedics;  Laterality: Right;  . TUBAL LIGATION    . UPPER GASTROINTESTINAL ENDOSCOPY    . UPPER GI ENDOSCOPY      There were no vitals filed for this visit.   Subjective Assessment - 03/12/20 1206    Subjective  [name] presents to OT for Rx visit 13/36 to address BLE lymphedema. Pt reports pain in RLE which presents with worsening symptoms and signs of cellulitis/ infection at 5/10. Pt reports RLE pain interferred with sleep over night. Pt reports she has not been seen by a doctor or her nurse practitioner at her SNF, and has not received antibiotic specifically for skin infection. Pt calls daughter during visit and reprts worsening symptoms/ signs, including lymphorrhea, during visit. OT spoke with Sebrina(title?) and Desiree, NP, at SNF by phone with concernerns re progressing cellulitis. OT spoke with Pt's daughter by phone with report on worsening symptoms and contacts with SNF.    Pertinent History chronic BLE leg swelling and associated pain ~ 40 years; hx herniated intervertebral discL TKA; L total knee revision; HTN, Obesity. dementia; restless leg; CHF; unstable anina; R RC arthroplasty, OA, cervical spondylosis with myelopathy, chronic lower back pain, CKD (Stage?); Fibromyalgia; Hypothyroidism; OSA ( cPap?); hx pneumonia; RA, Type II Diabetes. S/P cardiac cathg, B THA, Lumbar laminectomy, posterior lumbar fusion, B shoulder arthroplasty; total abdominal hysterectomy    Limitations chronic leg swelling and associated pain, difficulty walking, impaired dynamic balance, muscle weakness,    Repetition Increases Symptoms    Special Tests + STEMMER sign base of toes bilaterally    Pain Onset --   s/p 45 yrs                        OT Treatments/Exercises (OP) - 03/12/20 0001      ADLs   ADL Education Given Yes      Manual Therapy   Manual Therapy Edema management    Manual therapy comments RLE/RLQ CELLULITIS PRECAUTIONS. NO MLD. NO COMPRESSION TO RLE/RLQ FOR 72 hours after commencing antiibiotic. OK to treat LLE.    Manual Lymphatic Drainage (MLD)  LLE MLD to LLE/LLQ utilizing modified "short neck sequence" with no strokes to neck in keeping w' LE thyroid precautions. UTILIZED DEEP ABDOMINAL PATHWAYS VIS DIAPHRAGMATIC BREATHING, FUNCTIONAL INGUINAL ln, AND PROGIDED DYNAMIC STROKES TO LEG IN  SEGMENTS FROM PROXIMAL TO DISTAL. eXCELLENT TOLERANCE.    Compression Bandaging LLE thigh length gradient compression wraps using short stretch bandages. as established                  OT Education - 03/12/20 1216    Education Details continued Pt edu for cellulitis signs and symptoms, precautions, and treatment    Person(s) Educated Patient    Methods Explanation;Demonstration;Handout    Comprehension Verbalized understanding;Returned demonstration;Need further instruction               OT Long Term Goals - 03/08/20 1101      OT LONG TERM GOAL #1   Title Pt will be able to apply LLE multi-layer, short stretch compression wraps daily with maximum caregiver assistance, using correct gradient techniques independently to return affected limb/s, as closely as possible, to premorbid size and shape, to limit infection risk, and to improve safe functional mobility and ADLs performance.    Baseline dependent    Time 4    Period Days    Status Revised   Pt has no assistance for wraps at nursing facility. Goal updated to compression wrap tolerance for 23/ 7 during Intensive Phase CDT.     OT LONG TERM GOAL #2   Title Pt will be able to verbalize signs and symptoms of cellulitis infection and identify 4 common lymphedema precautions using printed resource for reference (modified independence)  to limit LE progression over time.    Baseline Max A    Time 4    Period Days    Status Achieved      OT LONG TERM GOAL #3   Title Pt will sustain than 85% compliance with all daily LE self-care home program components throughout Intensive Phase CDT, including impeccable skin care, lymphatic pumping therex,  compression wraps and simple self MLD, to ensure optimal limb volume reduction, to limit infection risk and to limit LE progression.    Baseline dependent    Time 12    Period Weeks    Status Partially Met   Pt is not leaving wraps in place 23/7 as directed at this point. This is partially due to bathing schedule at nursing home, and lack of assistance for reapplying wraps during visit intervals.     OT LONG TERM GOAL #4   Title Pt to achieve at least 10% LLE limb volume reduction during Intensive Phase CDT with Max CG assistance for wrapping, to improve functional performance of basic and instrumental ADLs, and to limit LE progression.    Baseline dependent    Time 12    Period Weeks    Status Partially Met   Met for LLE with A-G reduction of 12/19% today. New Goal 15% bilaterally     OT LONG TERM GOAL #5   Title During self-management phase of CDT Pt will retain limb volume reductions achieved during Intensive Phase CDT with no more than 3% volume increase using all LE self care components  to limit LE progression and further functional decline.    Baseline dependent    Time 6    Period Months    Status On-going                 Plan - 03/12/20 1231    Clinical Impression Statement Pt presenting with worsening signs/ symptoms of RLE cellulitis below the knee, including increased redness, swelling and skin temperature. Pores are distended on posterior calf and light lymphorrhea observed at distal leg where  skin appears to be blistering. LLE appears WNL. Pt tolerated MLD, skin care and compression wraps to LLE as established without increased pain.today. Will continue to  monitor condition closely.    Rehab Potential Good           Patient will benefit from skilled therapeutic intervention in order to improve the following deficits and impairments:           Visit Diagnosis: Lymphedema, not elsewhere classified    Problem List Patient Active Problem List   Diagnosis Date Noted  . Iron deficiency 08/06/2018  . History of total left knee replacement (TKR) 07/18/2018  . Rectal bleeding 07/05/2018  . Hemorrhoids 07/05/2018  . Anal itching 07/05/2018  . Rotator cuff arthropathy of right shoulder   . Shoulder arthritis 06/15/2017  . Status post revision of total knee replacement, left 04/29/2017  . Polyethylene liner wear following left total knee arthroplasty requiring isolated polyethylene liner exchange ([place]) 03/02/2017  . 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 Unstable angina ([place]) 05/21/2016  . Chronic diastolic heart failure ([place]) 03/29/2016  . Normal coronary arteries 2009 01/14/2015  . Obesity-BMI 40 01/14/2015  . Restless leg 01/14/2015  . Chest pain 01/14/2015  . Back pain 01/14/2015  . Renal insufficiency 01/14/2015  . Dementia- mild memory issues 01/14/2015  . Occult blood positive stool 12/14/2014  . Anemia 12/14/2014  . Family history of colon cancer 12/14/2014  . Change in bowel habits 12/14/2014  . GERD (gastroesophageal reflux disease) 12/14/2014  . Dyspnea 05/17/2012  . Lymphedema 05/17/2012  . Weight gain 05/17/2012  . HTN (hypertension) 05/17/2012    Andrey Spearman, MS, OTR/L, [facility] 03/12/20 12:39 PM  [place] MAIN [facility] SERVICES [address] [place], [place], [address] Phone: [phone]   Fax:  [phone]  Name: [name] MRN: [id number] Date of Birth: June 02, 1946

## 2020-03-14 ENCOUNTER — Encounter: Payer: Self-pay | Admitting: Occupational Therapy

## 2020-03-14 ENCOUNTER — Ambulatory Visit: Payer: Medicare Other | Admitting: Occupational Therapy

## 2020-03-14 ENCOUNTER — Other Ambulatory Visit: Payer: Self-pay

## 2020-03-14 DIAGNOSIS — I890 Lymphedema, not elsewhere classified: Secondary | ICD-10-CM

## 2020-03-14 NOTE — Therapy (Signed)
[place] MAIN [facility] SERVICES [address] [place], [place], [address] Phone: [phone]   Fax:  [phone]  Occupational Therapy Treatment  Patient Details  Name: [name] MRN: [id number] Date of Birth: 19-Sep-1945 Referring Provider (OT): Juluis Pitch, MD   Encounter Date: 03/14/2020   OT End of Session - 03/14/20 1254    Visit Number 14    Number of Visits 36    Date for OT Re-Evaluation 05/09/20    OT Start Time 0910    OT Stop Time 1010    OT Time Calculation (min) 60 min    Activity Tolerance Patient tolerated treatment well;No increased pain    Behavior During Therapy WFL for tasks assessed/performed           Past Medical History:  Diagnosis Date   Anemia    Anxiety    Arthritis    "hands, feet" (03/03/2017)   Brain lesion    2 types   Cataract    Cervical spondylosis with myelopathy    Chest pain    Normal cardiac cath 5/09   Chronic lower back pain    CKD (chronic kidney disease), stage III    [name]   Congestive heart failure (CHF) (HCC)    has a Cardiomems implant    Constipation    Dementia ([place])    Depression    Dumping syndrome    Dyspnea    with activity   Edema    Fatigue    Fibromyalgia    GERD (gastroesophageal reflux disease)    Headache    "w/high CBG" (03/03/2017)   History of echocardiogram    a. Echo 03/20/16 (done at [facility] in [place], [place]):  mild LVH, EF 81%, normal diastolic function, mild LAE, MAC, RVSP 25 mmHg   Hyperlipidemia    Hypertension    Hypothyroidism    Lumbar spondylosis    Lymphedema    seeing specialist for this   Migraine    "mostly stopped when I changed my diet" (03/03/2017)   OSA on CPAP    Pneumonia X 1   PONV (postoperative nausea and vomiting)    Restless leg syndrome    Rheumatoid arthritis ([place])    Stroke ([place])    TIAs "mini strokes"- unsure of last TIA - pt and daughter deny this    Syncope    Tremors of nervous system    Type II diabetes mellitus ([place])     Past Surgical History:  Procedure Laterality Date   APPENDECTOMY  1966   BACK SURGERY     CARDIAC CATHETERIZATION N/A 05/25/2016   Procedure: Right/Left Heart Cath and Coronary Angiography;  Surgeon: Larey Dresser, MD;  Location: [place] [facility];  Service: Cardiovascular;  Laterality: N/A;   CATARACT EXTRACTION W/ INTRAOCULAR LENS  IMPLANT, BILATERAL Bilateral    COLONOSCOPY     CORONARY ANGIOGRAM  2009   Normal coronaries   EXCISION/RELEASE BURSA HIP Bilateral    I & D KNEE WITH POLY EXCHANGE Left 03/02/2017   Procedure: Left knee Revision of poly liner;  Surgeon: Mcarthur Rossetti, MD;  Location: [place];  Service: Orthopedics;  Laterality: Left;   JOINT REPLACEMENT     KNEE ARTHROSCOPY Bilateral    LAPAROSCOPIC CHOLECYSTECTOMY  2015   LMF  2017   CardiMEMS HF implant for CHF (measures amount of fluid in the heart)   LUMBAR DISC SURGERY     LUMBAR LAMINECTOMY/DECOMPRESSION MICRODISCECTOMY Left  12/2005   L2-3 laminectomy and diskectomy/notes 01/06/2011   POSTERIOR LUMBAR FUSION  04/2006   [name] 01/06/2011; "put cages in"   REVERSE SHOULDER ARTHROPLASTY Left 06/15/2017   Procedure: REVERSE LEFT SHOULDER ARTHROPLASTY;  Surgeon: Meredith Pel, MD;  Location: [place];  Service: Orthopedics;  Laterality: Left;   REVERSE SHOULDER ARTHROPLASTY Right 02/01/2018   REVERSE SHOULDER ARTHROPLASTY Right 02/01/2018   Procedure: RIGHT REVERSE SHOULDER ARTHROPLASTY;  Surgeon: Meredith Pel, MD;  Location: [place];  Service: Orthopedics;  Laterality: Right;   REVISION TOTAL KNEE ARTHROPLASTY Left 03/02/2017   poly liner/notes 03/02/2017   RIGHT HEART CATH N/A 11/13/2016   Procedure: Right Heart Cath with Cardiomems;  Surgeon: Larey Dresser, MD;  Location: [place] [facility];  Service: Cardiovascular;  Laterality: N/A;   SHOULDER ARTHROSCOPY WITH ROTATOR CUFF REPAIR Right    SHOULDER  OPEN ROTATOR CUFF REPAIR Left 01/2011   [name] 01/29/2011   TOTAL ABDOMINAL HYSTERECTOMY     TOTAL KNEE ARTHROPLASTY Left    TOTAL KNEE ARTHROPLASTY Right 11/18/2012   Procedure: TOTAL KNEE ARTHROPLASTY;  Surgeon: Yvette Rack., MD;  Location: [place];  Service: Orthopedics;  Laterality: Right;   TUBAL LIGATION     UPPER GASTROINTESTINAL ENDOSCOPY     UPPER GI ENDOSCOPY      There were no vitals filed for this visit.   Subjective Assessment - 03/14/20 0912    Subjective  [name] presents to OT for Rx visit 14/36 to address BLE lymphedema. Pt reports she saw the doctor and started an antibiotic for RLE cellulitis yesterday.    Pertinent History chronic BLE leg swelling and associated pain ~ 40 years; hx herniated intervertebral discL TKA; L total knee revision; HTN, Obesity. dementia; restless leg; CHF; unstable anina; R RC arthroplasty, OA, cervical spondylosis with myelopathy, chronic lower back pain, CKD (Stage?); Fibromyalgia; Hypothyroidism; OSA ( cPap?); hx pneumonia; RA, Type II Diabetes. S/P cardiac cathg, B THA, Lumbar laminectomy, posterior lumbar fusion, B shoulder arthroplasty; total abdominal hysterectomy    Limitations chronic leg swelling and associated pain, difficulty walking, impaired dynamic balance, muscle weakness,    Repetition Increases Symptoms    Special Tests + STEMMER sign base of toes bilaterally    Pain Onset --   s/p 45 yrs                       OT Treatments/Exercises (OP) - 03/14/20 0001      ADLs   ADL Education Given Yes      Manual Therapy   Manual Therapy Edema management;Compression Bandaging;Manual Lymphatic Drainage (MLD)    Manual therapy comments RLE/RLQ CELLULITIS PRECAUTIONS. NO MLD. NO COMPRESSION TO RLE/RLQ FOR 72 hours after commencing antiibiotic. OK to treat LLE.    Manual Lymphatic Drainage (MLD)  LLE MLD to LLE/LLQ utilizing modified "short neck sequence" with no strokes to neck in keeping w' LE thyroid precautions.  UTILIZED DEEP ABDOMINAL PATHWAYS VIS DIAPHRAGMATIC BREATHING, FUNCTIONAL INGUINAL ln, AND PROGIDED DYNAMIC STROKES TO LEG IN SEGMENTS FROM PROXIMAL TO DISTAL. eXCELLENT TOLERANCE.    Compression Bandaging LLE thigh length gradient compression wraps using short stretch bandages. as established                  OT Education - 03/14/20 1253    Education Details continued Pt edu for cellulitis signs and symptoms, precautions, and treatment    Person(s) Educated Patient    Methods Explanation;Demonstration;Handout    Comprehension Verbalized understanding;Returned demonstration;Need  further instruction               OT Long Term Goals - 03/08/20 1101      OT LONG TERM GOAL #1   Title Pt will be able to apply LLE multi-layer, short stretch compression wraps daily with maximum caregiver assistance, using correct gradient techniques independently to return affected limb/s, as closely as possible, to premorbid size and shape, to limit infection risk, and to improve safe functional mobility and ADLs performance.    Baseline dependent    Time 4    Period Days    Status Revised   Pt has no assistance for wraps at nursing facility. Goal updated to compression wrap tolerance for 23/ 7 during Intensive Phase CDT.     OT LONG TERM GOAL #2   Title Pt will be able to verbalize signs and symptoms of cellulitis infection and identify 4 common lymphedema precautions using printed resource for reference (modified independence) to limit LE progression over time.    Baseline Max A    Time 4    Period Days    Status Achieved      OT LONG TERM GOAL #3   Title Pt will sustain than 85% compliance with all daily LE self-care home program components throughout Intensive Phase CDT, including impeccable skin care, lymphatic pumping therex,  compression wraps and simple self MLD, to ensure optimal limb volume reduction, to limit infection risk and to limit LE progression.    Baseline dependent    Time 12     Period Weeks    Status Partially Met   Pt is not leaving wraps in place 23/7 as directed at this point. This is partially due to bathing schedule at nursing home, and lack of assistance for reapplying wraps during visit intervals.     OT LONG TERM GOAL #4   Title Pt to achieve at least 10% LLE limb volume reduction during Intensive Phase CDT with Max CG assistance for wrapping, to improve functional performance of basic and instrumental ADLs, and to limit LE progression.    Baseline dependent    Time 12    Period Weeks    Status Partially Met   Met for LLE with A-G reduction of 12/19% today. New Goal 15% bilaterally     OT LONG TERM GOAL #5   Title During self-management phase of CDT Pt will retain limb volume reductions achieved during Intensive Phase CDT with no more than 3% volume increase using all LE self care components  to limit LE progression and further functional decline.    Baseline dependent    Time 6    Period Months    Status On-going                 Plan - 03/14/20 1255    Clinical Impression Statement RLE presenting with increased skn temperature and erythema since last visit , despite antibiotic on board for 4-5 doses.Leg below knee with increased swelling, pore distension (peau d'orange) and  Skin is shiney and tight with visible cracks forming in epidermis.  Lymphorrhea observed again today over area of blisters  formed superficially at distal leg above ankle. Pt tolerated MLD, compression wraps and skin care to LLE as established today. Pt continues to demonstrate excellent response to LLE Intensive Phase CDT as evidenced by improved skin hydration and flexibility, limb volume reduction from ankle to groin, and increased knee and ankle joint AROM. Cont as per OC to LLE. Watch RLE cellulitis  carefully and report changes to referring provider..    Rehab Potential Good           Patient will benefit from skilled therapeutic intervention in order to improve the  following deficits and impairments:           Visit Diagnosis: Lymphedema, not elsewhere classified    Problem List Patient Active Problem List   Diagnosis Date Noted   Iron deficiency 08/06/2018   History of total left knee replacement (TKR) 07/18/2018   Rectal bleeding 07/05/2018   Hemorrhoids 07/05/2018   Anal itching 07/05/2018   Rotator cuff arthropathy of right shoulder    Shoulder arthritis 06/15/2017   Status post revision of total knee replacement, left 04/29/2017   Polyethylene liner wear following left total knee arthroplasty requiring isolated polyethylene liner exchange ([place]) 03/02/2017   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[place]) 03/02/2017   Unstable angina ([place]) 05/21/2016   Chronic diastolic heart failure ([place]) 03/29/2016   Normal coronary arteries 2009 01/14/2015   Obesity-BMI 40 01/14/2015   Restless leg 01/14/2015   Chest pain 01/14/2015   Back pain 01/14/2015   Renal insufficiency 01/14/2015   Dementia- mild memory issues 01/14/2015   Occult blood positive stool 12/14/2014   Anemia 12/14/2014   Family history of colon cancer 12/14/2014   Change in bowel habits 12/14/2014   GERD (gastroesophageal reflux disease) 12/14/2014   Dyspnea 05/17/2012   Lymphedema 05/17/2012   Weight gain 05/17/2012   HTN (hypertension) 05/17/2012   Andrey Spearman, MS, OTR/L, CLT-LANA 03/14/20 1:06 PM   [place] [facility] MAIN [facility] SERVICES [address] [place], [place], [address] Phone: [phone]   Fax:  [phone]  Name: [name] MRN: [id number] Date of Birth: 12/18/1945

## 2020-03-15 ENCOUNTER — Other Ambulatory Visit: Payer: Self-pay

## 2020-03-15 ENCOUNTER — Ambulatory Visit: Payer: Medicare Other | Admitting: Occupational Therapy

## 2020-03-15 DIAGNOSIS — I890 Lymphedema, not elsewhere classified: Secondary | ICD-10-CM

## 2020-03-15 NOTE — Therapy (Signed)
[name] SERVICES [address] [place], [place], [address] Phone: [phone]   Fax:  [phone]  Occupational Therapy Treatment  Patient Details  Name: [name] MRN: [id number] Date of Birth: 10-Dec-1945 Referring Provider (OT): [name] Pitch, MD   Encounter Date: 03/15/2020   OT End of Session - 03/15/20 1144    Visit Number 15    Number of Visits 36    Date for OT Re-Evaluation 05/09/20    OT Start Time 0804    OT Stop Time 0910    OT Time Calculation (min) 66 min    Activity Tolerance Patient tolerated treatment well;No increased pain    Behavior During Therapy WFL for tasks assessed/performed           Past Medical History:  Diagnosis Date  . Anemia   . Anxiety   . Arthritis    "hands, feet" (03/03/2017)  . Brain lesion    2 types  . Cataract   . Cervical spondylosis with myelopathy   . Chest pain    Normal cardiac cath 5/09  . Chronic lower back pain   . CKD (chronic kidney disease), stage III    [name]  . Congestive heart failure (CHF) ([place])    has a Cardiomems implant   . Constipation   . Dementia ([name])   . Depression   . Dumping syndrome   . Dyspnea    with activity  . Edema   . Fatigue   . Fibromyalgia   . GERD (gastroesophageal reflux disease)   . Headache    "w/high CBG" (03/03/2017)  . History of echocardiogram    a. Echo 03/20/16 (done at [name] in [place], [place]):  mild LVH, EF 59%, normal diastolic function, mild LAE, MAC, RVSP 25 mmHg  . Hyperlipidemia   . Hypertension   . Hypothyroidism   . Lumbar spondylosis   . Lymphedema    seeing specialist for this  . Migraine    "mostly stopped when I changed my diet" (03/03/2017)  . OSA on CPAP   . Pneumonia X 1  . PONV (postoperative nausea and vomiting)   . Restless leg syndrome   . Rheumatoid arthritis ([name])   . Stroke [name])    TIAs "mini strokes"- unsure of last TIA - pt and daughter deny this  .  Syncope   . Tremors of nervous system   . Type II diabetes mellitus ([name])     Past Surgical History:  Procedure Laterality Date  . APPENDECTOMY  1966  . BACK SURGERY    . CARDIAC CATHETERIZATION N/A 05/25/2016   Procedure: Right/Left Heart Cath and Coronary Angiography;  Surgeon: [name] Dresser, MD;  Location: [place] [facility];  Service: Cardiovascular;  Laterality: N/A;  . CATARACT EXTRACTION W/ INTRAOCULAR LENS  IMPLANT, BILATERAL Bilateral   . COLONOSCOPY    . CORONARY ANGIOGRAM  2009   Normal coronaries  . EXCISION/RELEASE BURSA HIP Bilateral   . I & D KNEE WITH POLY EXCHANGE Left 03/02/2017   Procedure: Left knee Revision of poly liner;  Surgeon: [name] Rossetti, MD;  Location: [place];  Service: Orthopedics;  Laterality: Left;  . JOINT REPLACEMENT    . KNEE ARTHROSCOPY Bilateral   . LAPAROSCOPIC CHOLECYSTECTOMY  2015  . LMF  2017   CardiMEMS HF implant for CHF (measures amount of fluid in the heart)  . LUMBAR DISC SURGERY    . LUMBAR LAMINECTOMY/DECOMPRESSION MICRODISCECTOMY Left  12/2005   [name]-3 laminectomy and diskectomy/notes 01/06/2011  . POSTERIOR LUMBAR FUSION  04/2006   [name] 01/06/2011; "put cages in"  . REVERSE SHOULDER ARTHROPLASTY Left 06/15/2017   Procedure: REVERSE LEFT SHOULDER ARTHROPLASTY;  Surgeon: [name] Pel, MD;  Location: [place];  Service: Orthopedics;  Laterality: Left;  . REVERSE SHOULDER ARTHROPLASTY Right 02/01/2018  . REVERSE SHOULDER ARTHROPLASTY Right 02/01/2018   Procedure: RIGHT REVERSE SHOULDER ARTHROPLASTY;  Surgeon: [name] Pel, MD;  Location: [place];  Service: Orthopedics;  Laterality: Right;  . REVISION TOTAL KNEE ARTHROPLASTY Left 03/02/2017   poly liner/notes 03/02/2017  . RIGHT HEART CATH N/A 11/13/2016   Procedure: Right Heart Cath with Cardiomems;  Surgeon: [name] Dresser, MD;  Location: [place] [facility];  Service: Cardiovascular;  Laterality: N/A;  . SHOULDER ARTHROSCOPY WITH ROTATOR CUFF REPAIR Right   . SHOULDER  OPEN ROTATOR CUFF REPAIR Left 01/2011   [name] 01/29/2011  . TOTAL ABDOMINAL HYSTERECTOMY    . TOTAL KNEE ARTHROPLASTY Left   . TOTAL KNEE ARTHROPLASTY Right 11/18/2012   Procedure: TOTAL KNEE ARTHROPLASTY;  Surgeon: [name]., MD;  Location: [place];  Service: Orthopedics;  Laterality: Right;  . TUBAL LIGATION    . UPPER GASTROINTESTINAL ENDOSCOPY    . UPPER GI ENDOSCOPY      There were no vitals filed for this visit.   Subjective Assessment - 03/15/20 0818    Subjective  [name] presents to OT for Rx visit 15/36 to address BLE lymphedema. Pt reports she started "a different antibiotic"  yesterday and has had one dose. Pt has no new complaints.    Pertinent History chronic BLE leg swelling and associated pain ~ 40 years; hx herniated intervertebral discL TKA; L total knee revision; HTN, Obesity. dementia; restless leg; CHF; unstable anina; R RC arthroplasty, OA, cervical spondylosis with myelopathy, chronic lower back pain, CKD (Stage?); Fibromyalgia; Hypothyroidism; OSA ( cPap?); hx pneumonia; RA, Type II Diabetes. S/P cardiac cathg, B THA, Lumbar laminectomy, posterior lumbar fusion, B shoulder arthroplasty; total abdominal hysterectomy    Limitations chronic leg swelling and associated pain, difficulty walking, impaired dynamic balance, muscle weakness,    Repetition Increases Symptoms    Special Tests + STEMMER sign base of toes bilaterally    Pain Onset --   s/p 45 yrs                       OT Treatments/Exercises (OP) - 03/15/20 0001      ADLs   ADL Education Given Yes      Manual Therapy   Manual Therapy Edema management;Manual Lymphatic Drainage (MLD);Compression Bandaging    Manual therapy comments RLE/RLQ CELLULITIS PRECAUTIONS. NO MLD. NO COMPRESSION TO RLE/RLQ FOR 72 hours after commencing antiibiotic. OK to treat LLE.    Manual Lymphatic Drainage (MLD)  LLE MLD to LLE/LLQ utilizing modified "short neck sequence" with no strokes to neck in keeping w' LE  thyroid precautions. UTILIZED DEEP ABDOMINAL PATHWAYS VIS DIAPHRAGMATIC BREATHING, FUNCTIONAL INGUINAL ln, AND PROGIDED DYNAMIC STROKES TO LEG IN SEGMENTS FROM PROXIMAL TO DISTAL. eXCELLENT TOLERANCE.    Compression Bandaging LLE thigh length gradient compression wraps using short stretch bandages. as established                  OT Education - 03/15/20 1144    Education Details Continued skilled Pt/caregiver education  And LE ADL training throughout visit for lymphedema self care/ home program, including compression wrapping, compression garment and device  wear/care, lymphatic pumping ther ex, simple self-MLD, and skin care. Discussed progress towards goals.    Person(s) Educated Patient    Methods Explanation;Demonstration;Handout    Comprehension Verbalized understanding;Returned demonstration;Need further instruction               OT Long Term Goals - 03/08/20 1101      OT LONG TERM GOAL #1   Title Pt will be able to apply LLE multi-layer, short stretch compression wraps daily with maximum caregiver assistance, using correct gradient techniques independently to return affected limb/s, as closely as possible, to premorbid size and shape, to limit infection risk, and to improve safe functional mobility and ADLs performance.    Baseline dependent    Time 4    Period Days    Status Revised   Pt has no assistance for wraps at nursing facility. Goal updated to compression wrap tolerance for 23/ 7 during Intensive Phase CDT.     OT LONG TERM GOAL #2   Title Pt will be able to verbalize signs and symptoms of cellulitis infection and identify 4 common lymphedema precautions using printed resource for reference (modified independence) to limit LE progression over time.    Baseline Max A    Time 4    Period Days    Status Achieved      OT LONG TERM GOAL #3   Title Pt will sustain than 85% compliance with all daily LE self-care home program components throughout Intensive Phase  CDT, including impeccable skin care, lymphatic pumping therex,  compression wraps and simple self MLD, to ensure optimal limb volume reduction, to limit infection risk and to limit LE progression.    Baseline dependent    Time 12    Period Weeks    Status Partially Met   Pt is not leaving wraps in place 23/7 as directed at this point. This is partially due to bathing schedule at nursing home, and lack of assistance for reapplying wraps during visit intervals.     OT LONG TERM GOAL #4   Title Pt to achieve at least 10% LLE limb volume reduction during Intensive Phase CDT with Max CG assistance for wrapping, to improve functional performance of basic and instrumental ADLs, and to limit LE progression.    Baseline dependent    Time 12    Period Weeks    Status Partially Met   Met for LLE with A-G reduction of 12/19% today. New Goal 15% bilaterally     OT LONG TERM GOAL #5   Title During self-management phase of CDT Pt will retain limb volume reductions achieved during Intensive Phase CDT with no more than 3% volume increase using all LE self care components  to limit LE progression and further functional decline.    Baseline dependent    Time 6    Period Months    Status On-going                 Plan - 03/15/20 1145    Clinical Impression Statement RLE skin temperature is decreased, but  leg remains reddenend and warm by palpation. Lymphorrhea is absent at distal leg and ankle today. Pt tolerated  LLE/LLQ MLD, skin care and compression wraps to without increased pain. LLE volume reduction   continues to slowly decrease, which is typical for lipo-lymphedema. Faty edema continues to be  most concentrated from mid thigh to ankle. Pt would benefit from physical therqpy for BLE strengthening and to facilitate increased lymphatic pumping at calf  muscle. Cont as per POC.           Patient will benefit from skilled therapeutic intervention in order to improve the following deficits and  impairments:           Visit Diagnosis: Lymphedema, not elsewhere classified    Problem List Patient Active Problem List   Diagnosis Date Noted  . Iron deficiency 08/06/2018  . History of total left knee replacement (TKR) 07/18/2018  . Rectal bleeding 07/05/2018  . Hemorrhoids 07/05/2018  . Anal itching 07/05/2018  . Rotator cuff arthropathy of right shoulder   . Shoulder arthritis 06/15/2017  . Status post revision of total knee replacement, left 04/29/2017  . Polyethylene liner wear following left total knee arthroplasty requiring isolated polyethylene liner exchange ([place]) 03/02/2017  . 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 Unstable angina ([place]) 05/21/2016  . Chronic diastolic heart failure ([place]) 03/29/2016  . Normal coronary arteries 2009 01/14/2015  . Obesity-BMI 40 01/14/2015  . Restless leg 01/14/2015  . Chest pain 01/14/2015  . Back pain 01/14/2015  . Renal insufficiency 01/14/2015  . Dementia- mild memory issues 01/14/2015  . Occult blood positive stool 12/14/2014  . Anemia 12/14/2014  . Family history of colon cancer 12/14/2014  . Change in bowel habits 12/14/2014  . GERD (gastroesophageal reflux disease) 12/14/2014  . Dyspnea 05/17/2012  . Lymphedema 05/17/2012  . Weight gain 05/17/2012  . HTN (hypertension) 05/17/2012   Andrey Spearman, MS, OTR/L, [name] 03/15/20 11:50 AM   [place] MAIN [name] SERVICES [address] [place], [place], [address] Phone: [phone]   Fax:  [phone]  Name: [name] MRN: [id number] Date of Birth: 07/29/1946

## 2020-03-15 NOTE — Therapy (Deleted)
[place] MAIN [facility] SERVICES [address] [place], [place], [address] Phone: [phone]   Fax:  [phone]  Occupational Therapy Treatment  Patient Details  Name: [name] MRN: [id number] Date of Birth: 10-Dec-1945 Referring Provider (OT): Juluis Pitch, MD   Encounter Date: 03/15/2020   OT End of Session - 03/15/20 1144    Visit Number 15    Number of Visits 36    Date for OT Re-Evaluation 05/09/20    OT Start Time 0804    OT Stop Time 0910    OT Time Calculation (min) 66 min    Activity Tolerance Patient tolerated treatment well;No increased pain    Behavior During Therapy WFL for tasks assessed/performed           Past Medical History:  Diagnosis Date  . Anemia   . Anxiety   . Arthritis    "hands, feet" (03/03/2017)  . Brain lesion    2 types  . Cataract   . Cervical spondylosis with myelopathy   . Chest pain    Normal cardiac cath 5/09  . Chronic lower back pain   . CKD (chronic kidney disease), stage III    [name]  . Congestive heart failure (CHF) ([place])    has a Cardiomems implant   . Constipation   . Dementia ([place])   . Depression   . Dumping syndrome   . Dyspnea    with activity  . Edema   . Fatigue   . Fibromyalgia   . GERD (gastroesophageal reflux disease)   . Headache    "w/high CBG" (03/03/2017)  . History of echocardiogram    a. Echo 03/20/16 (done at [facility] in [place], [place]):  mild LVH, EF 59%, normal diastolic function, mild LAE, MAC, RVSP 25 mmHg  . Hyperlipidemia   . Hypertension   . Hypothyroidism   . Lumbar spondylosis   . Lymphedema    seeing specialist for this  . Migraine    "mostly stopped when I changed my diet" (03/03/2017)  . OSA on CPAP   . Pneumonia X 1  . PONV (postoperative nausea and vomiting)   . Restless leg syndrome   . Rheumatoid arthritis ([place])   . Stroke [facility])    TIAs "mini strokes"- unsure of last TIA - pt and daughter deny this  .  Syncope   . Tremors of nervous system   . Type II diabetes mellitus ([place])     Past Surgical History:  Procedure Laterality Date  . APPENDECTOMY  1966  . BACK SURGERY    . CARDIAC CATHETERIZATION N/A 05/25/2016   Procedure: Right/Left Heart Cath and Coronary Angiography;  Surgeon: Larey Dresser, MD;  Location: [place] [facility];  Service: Cardiovascular;  Laterality: N/A;  . CATARACT EXTRACTION W/ INTRAOCULAR LENS  IMPLANT, BILATERAL Bilateral   . COLONOSCOPY    . CORONARY ANGIOGRAM  2009   Normal coronaries  . EXCISION/RELEASE BURSA HIP Bilateral   . I & D KNEE WITH POLY EXCHANGE Left 03/02/2017   Procedure: Left knee Revision of poly liner;  Surgeon: Mcarthur Rossetti, MD;  Location: [place];  Service: Orthopedics;  Laterality: Left;  . JOINT REPLACEMENT    . KNEE ARTHROSCOPY Bilateral   . LAPAROSCOPIC CHOLECYSTECTOMY  2015  . LMF  2017   CardiMEMS HF implant for CHF (measures amount of fluid in the heart)  . LUMBAR DISC SURGERY    . LUMBAR LAMINECTOMY/DECOMPRESSION MICRODISCECTOMY Left  12/2005   L2-3 laminectomy and diskectomy/notes 01/06/2011  . POSTERIOR LUMBAR FUSION  04/2006   [name] 01/06/2011; "put cages in"  . REVERSE SHOULDER ARTHROPLASTY Left 06/15/2017   Procedure: REVERSE LEFT SHOULDER ARTHROPLASTY;  Surgeon: Meredith Pel, MD;  Location: [place];  Service: Orthopedics;  Laterality: Left;  . REVERSE SHOULDER ARTHROPLASTY Right 02/01/2018  . REVERSE SHOULDER ARTHROPLASTY Right 02/01/2018   Procedure: RIGHT REVERSE SHOULDER ARTHROPLASTY;  Surgeon: Meredith Pel, MD;  Location: [place];  Service: Orthopedics;  Laterality: Right;  . REVISION TOTAL KNEE ARTHROPLASTY Left 03/02/2017   poly liner/notes 03/02/2017  . RIGHT HEART CATH N/A 11/13/2016   Procedure: Right Heart Cath with Cardiomems;  Surgeon: Larey Dresser, MD;  Location: [place] [facility];  Service: Cardiovascular;  Laterality: N/A;  . SHOULDER ARTHROSCOPY WITH ROTATOR CUFF REPAIR Right   . SHOULDER  OPEN ROTATOR CUFF REPAIR Left 01/2011   [name] 01/29/2011  . TOTAL ABDOMINAL HYSTERECTOMY    . TOTAL KNEE ARTHROPLASTY Left   . TOTAL KNEE ARTHROPLASTY Right 11/18/2012   Procedure: TOTAL KNEE ARTHROPLASTY;  Surgeon: Yvette Rack., MD;  Location: [place];  Service: Orthopedics;  Laterality: Right;  . TUBAL LIGATION    . UPPER GASTROINTESTINAL ENDOSCOPY    . UPPER GI ENDOSCOPY      There were no vitals filed for this visit.   Subjective Assessment - 03/15/20 0818    Subjective  [name] presents to OT for Rx visit 15/36 to address BLE lymphedema. Pt reports she started "a different antibiotic"  yesterday and has had one dose. Pt has no new complaints.    Pertinent History chronic BLE leg swelling and associated pain ~ 40 years; hx herniated intervertebral discL TKA; L total knee revision; HTN, Obesity. dementia; restless leg; CHF; unstable anina; R RC arthroplasty, OA, cervical spondylosis with myelopathy, chronic lower back pain, CKD (Stage?); Fibromyalgia; Hypothyroidism; OSA ( cPap?); hx pneumonia; RA, Type II Diabetes. S/P cardiac cathg, B THA, Lumbar laminectomy, posterior lumbar fusion, B shoulder arthroplasty; total abdominal hysterectomy    Limitations chronic leg swelling and associated pain, difficulty walking, impaired dynamic balance, muscle weakness,    Repetition Increases Symptoms    Special Tests + STEMMER sign base of toes bilaterally    Pain Onset --   s/p 45 yrs                       OT Treatments/Exercises (OP) - 03/15/20 0001      ADLs   ADL Education Given Yes      Manual Therapy   Manual Therapy Edema management;Manual Lymphatic Drainage (MLD);Compression Bandaging    Manual therapy comments RLE/RLQ CELLULITIS PRECAUTIONS. NO MLD. NO COMPRESSION TO RLE/RLQ FOR 72 hours after commencing antiibiotic. OK to treat LLE.    Manual Lymphatic Drainage (MLD)  LLE MLD to LLE/LLQ utilizing modified "short neck sequence" with no strokes to neck in keeping w' LE  thyroid precautions. UTILIZED DEEP ABDOMINAL PATHWAYS VIS DIAPHRAGMATIC BREATHING, FUNCTIONAL INGUINAL ln, AND PROGIDED DYNAMIC STROKES TO LEG IN SEGMENTS FROM PROXIMAL TO DISTAL. eXCELLENT TOLERANCE.    Compression Bandaging LLE thigh length gradient compression wraps using short stretch bandages. as established                  OT Education - 03/15/20 1144    Education Details Continued skilled Pt/caregiver education  And LE ADL training throughout visit for lymphedema self care/ home program, including compression wrapping, compression garment and device  wear/care, lymphatic pumping ther ex, simple self-MLD, and skin care. Discussed progress towards goals.    Person(s) Educated Patient    Methods Explanation;Demonstration;Handout    Comprehension Verbalized understanding;Returned demonstration;Need further instruction               OT Long Term Goals - 03/08/20 1101      OT LONG TERM GOAL #1   Title Pt will be able to apply LLE multi-layer, short stretch compression wraps daily with maximum caregiver assistance, using correct gradient techniques independently to return affected limb/s, as closely as possible, to premorbid size and shape, to limit infection risk, and to improve safe functional mobility and ADLs performance.    Baseline dependent    Time 4    Period Days    Status Revised   Pt has no assistance for wraps at nursing facility. Goal updated to compression wrap tolerance for 23/ 7 during Intensive Phase CDT.     OT LONG TERM GOAL #2   Title Pt will be able to verbalize signs and symptoms of cellulitis infection and identify 4 common lymphedema precautions using printed resource for reference (modified independence) to limit LE progression over time.    Baseline Max A    Time 4    Period Days    Status Achieved      OT LONG TERM GOAL #3   Title Pt will sustain than 85% compliance with all daily LE self-care home program components throughout Intensive Phase  CDT, including impeccable skin care, lymphatic pumping therex,  compression wraps and simple self MLD, to ensure optimal limb volume reduction, to limit infection risk and to limit LE progression.    Baseline dependent    Time 12    Period Weeks    Status Partially Met   Pt is not leaving wraps in place 23/7 as directed at this point. This is partially due to bathing schedule at nursing home, and lack of assistance for reapplying wraps during visit intervals.     OT LONG TERM GOAL #4   Title Pt to achieve at least 10% LLE limb volume reduction during Intensive Phase CDT with Max CG assistance for wrapping, to improve functional performance of basic and instrumental ADLs, and to limit LE progression.    Baseline dependent    Time 12    Period Weeks    Status Partially Met   Met for LLE with A-G reduction of 12/19% today. New Goal 15% bilaterally     OT LONG TERM GOAL #5   Title During self-management phase of CDT Pt will retain limb volume reductions achieved during Intensive Phase CDT with no more than 3% volume increase using all LE self care components  to limit LE progression and further functional decline.    Baseline dependent    Time 6    Period Months    Status On-going                 Plan - 03/15/20 1145    Clinical Impression Statement Pt tolerated  LLE/LLQ MLD, skin care and compression wraps to without increased pain. LLE volume reduction   continues to slowly decrease, which is typical for lipo-lymphedema. Faty edema continues to be  most concentrated from mid thigh to ankle. Pt would benefit from physical therqpy for BLE strengthening and to facilitate increased lymphatic pumping at calf muscle. Cont as per POC.           Patient will benefit from skilled therapeutic intervention in  order to improve the following deficits and impairments:           Visit Diagnosis: Lymphedema, not elsewhere classified    Problem List Patient Active Problem List    Diagnosis Date Noted  . Iron deficiency 08/06/2018  . History of total left knee replacement (TKR) 07/18/2018  . Rectal bleeding 07/05/2018  . Hemorrhoids 07/05/2018  . Anal itching 07/05/2018  . Rotator cuff arthropathy of right shoulder   . Shoulder arthritis 06/15/2017  . Status post revision of total knee replacement, left 04/29/2017  . Polyethylene liner wear following left total knee arthroplasty requiring isolated polyethylene liner exchange ([place]) 03/02/2017  . 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 Unstable angina ([place]) 05/21/2016  . Chronic diastolic heart failure ([place]) 03/29/2016  . Normal coronary arteries 2009 01/14/2015  . Obesity-BMI 40 01/14/2015  . Restless leg 01/14/2015  . Chest pain 01/14/2015  . Back pain 01/14/2015  . Renal insufficiency 01/14/2015  . Dementia- mild memory issues 01/14/2015  . Occult blood positive stool 12/14/2014  . Anemia 12/14/2014  . Family history of colon cancer 12/14/2014  . Change in bowel habits 12/14/2014  . GERD (gastroesophageal reflux disease) 12/14/2014  . Dyspnea 05/17/2012  . Lymphedema 05/17/2012  . Weight gain 05/17/2012  . HTN (hypertension) 05/17/2012    Ansel Bong 03/15/2020, 11:48 AM  [place] MAIN [facility] SERVICES [address] [place], [place], [address] Phone: [phone]   Fax:  [phone]  Name: TAEYA THEALL MRN: [id number] Date of Birth: Feb 17, 1946

## 2020-03-18 ENCOUNTER — Ambulatory Visit (INDEPENDENT_AMBULATORY_CARE_PROVIDER_SITE_OTHER): Payer: Medicare Other | Admitting: Orthopedic Surgery

## 2020-03-18 DIAGNOSIS — M792 Neuralgia and neuritis, unspecified: Secondary | ICD-10-CM | POA: Diagnosis not present

## 2020-03-18 DIAGNOSIS — M542 Cervicalgia: Secondary | ICD-10-CM | POA: Diagnosis not present

## 2020-03-19 ENCOUNTER — Other Ambulatory Visit: Payer: Self-pay

## 2020-03-19 ENCOUNTER — Ambulatory Visit: Payer: Medicare Other | Admitting: Occupational Therapy

## 2020-03-19 DIAGNOSIS — I890 Lymphedema, not elsewhere classified: Secondary | ICD-10-CM | POA: Diagnosis not present

## 2020-03-19 NOTE — Therapy (Signed)
[place] MAIN [facility] SERVICES [address] [place], [place], [address] Phone: [phone]   Fax:  [phone]  Occupational Therapy Treatment  Patient Details  Name: [name] MRN: [id number] Date of Birth: 1946-07-25 Referring Provider (OT): Juluis Pitch, MD   Encounter Date: 03/19/2020   OT End of Session - 03/19/20 1303    Visit Number 16    Number of Visits 36    Date for OT Re-Evaluation 05/09/20    OT Start Time 0915    OT Stop Time 1015    OT Time Calculation (min) 60 min    Activity Tolerance Patient tolerated treatment well;No increased pain    Behavior During Therapy WFL for tasks assessed/performed           Past Medical History:  Diagnosis Date  . Anemia   . Anxiety   . Arthritis    "hands, feet" (03/03/2017)  . Brain lesion    2 types  . Cataract   . Cervical spondylosis with myelopathy   . Chest pain    Normal cardiac cath 5/09  . Chronic lower back pain   . CKD (chronic kidney disease), stage III    [name]  . Congestive heart failure (CHF) ([place])    has a Cardiomems implant   . Constipation   . Dementia ([place])   . Depression   . Dumping syndrome   . Dyspnea    with activity  . Edema   . Fatigue   . Fibromyalgia   . GERD (gastroesophageal reflux disease)   . Headache    "w/high CBG" (03/03/2017)  . History of echocardiogram    a. Echo 03/20/16 (done at [facility] in [place], [place]):  mild LVH, EF 51%, normal diastolic function, mild LAE, MAC, RVSP 25 mmHg  . Hyperlipidemia   . Hypertension   . Hypothyroidism   . Lumbar spondylosis   . Lymphedema    seeing specialist for this  . Migraine    "mostly stopped when I changed my diet" (03/03/2017)  . OSA on CPAP   . Pneumonia X 1  . PONV (postoperative nausea and vomiting)   . Restless leg syndrome   . Rheumatoid arthritis ([place])   . Stroke [facility])    TIAs "mini strokes"- unsure of last TIA - pt and daughter deny this  .  Syncope   . Tremors of nervous system   . Type II diabetes mellitus ([place])     Past Surgical History:  Procedure Laterality Date  . APPENDECTOMY  1966  . BACK SURGERY    . CARDIAC CATHETERIZATION N/A 05/25/2016   Procedure: Right/Left Heart Cath and Coronary Angiography;  Surgeon: Larey Dresser, MD;  Location: [place] [facility];  Service: Cardiovascular;  Laterality: N/A;  . CATARACT EXTRACTION W/ INTRAOCULAR LENS  IMPLANT, BILATERAL Bilateral   . COLONOSCOPY    . CORONARY ANGIOGRAM  2009   Normal coronaries  . EXCISION/RELEASE BURSA HIP Bilateral   . I & D KNEE WITH POLY EXCHANGE Left 03/02/2017   Procedure: Left knee Revision of poly liner;  Surgeon: Mcarthur Rossetti, MD;  Location: [place];  Service: Orthopedics;  Laterality: Left;  . JOINT REPLACEMENT    . KNEE ARTHROSCOPY Bilateral   . LAPAROSCOPIC CHOLECYSTECTOMY  2015  . LMF  2017   CardiMEMS HF implant for CHF (measures amount of fluid in the heart)  . LUMBAR DISC SURGERY    . LUMBAR LAMINECTOMY/DECOMPRESSION MICRODISCECTOMY Left  12/2005   L2-3 laminectomy and diskectomy/notes 01/06/2011  . POSTERIOR LUMBAR FUSION  04/2006   [name] 01/06/2011; "put cages in"  . REVERSE SHOULDER ARTHROPLASTY Left 06/15/2017   Procedure: REVERSE LEFT SHOULDER ARTHROPLASTY;  Surgeon: Meredith Pel, MD;  Location: [place];  Service: Orthopedics;  Laterality: Left;  . REVERSE SHOULDER ARTHROPLASTY Right 02/01/2018  . REVERSE SHOULDER ARTHROPLASTY Right 02/01/2018   Procedure: RIGHT REVERSE SHOULDER ARTHROPLASTY;  Surgeon: Meredith Pel, MD;  Location: [place];  Service: Orthopedics;  Laterality: Right;  . REVISION TOTAL KNEE ARTHROPLASTY Left 03/02/2017   poly liner/notes 03/02/2017  . RIGHT HEART CATH N/A 11/13/2016   Procedure: Right Heart Cath with Cardiomems;  Surgeon: Larey Dresser, MD;  Location: [place] [facility];  Service: Cardiovascular;  Laterality: N/A;  . SHOULDER ARTHROSCOPY WITH ROTATOR CUFF REPAIR Right   . SHOULDER  OPEN ROTATOR CUFF REPAIR Left 01/2011   [name] 01/29/2011  . TOTAL ABDOMINAL HYSTERECTOMY    . TOTAL KNEE ARTHROPLASTY Left   . TOTAL KNEE ARTHROPLASTY Right 11/18/2012   Procedure: TOTAL KNEE ARTHROPLASTY;  Surgeon: Yvette Rack., MD;  Location: [place];  Service: Orthopedics;  Laterality: Right;  . TUBAL LIGATION    . UPPER GASTROINTESTINAL ENDOSCOPY    . UPPER GI ENDOSCOPY      There were no vitals filed for this visit.   Subjective Assessment - 03/19/20 1257    Subjective  [name] presents to OT for Rx visit 16/36 to address BLE lymphedema. Pt reports she is worried about the results of imaging study of her spine, about what treatment may be available, risks of potential treatment and about her mobility status in the future.    Pertinent History chronic BLE leg swelling and associated pain ~ 40 years; hx herniated intervertebral discL TKA; L total knee revision; HTN, Obesity. dementia; restless leg; CHF; unstable anina; R RC arthroplasty, OA, cervical spondylosis with myelopathy, chronic lower back pain, CKD (Stage?); Fibromyalgia; Hypothyroidism; OSA ( cPap?); hx pneumonia; RA, Type II Diabetes. S/P cardiac cathg, B THA, Lumbar laminectomy, posterior lumbar fusion, B shoulder arthroplasty; total abdominal hysterectomy    Limitations chronic leg swelling and associated pain, difficulty walking, impaired dynamic balance, muscle weakness,    Repetition Increases Symptoms    Special Tests + STEMMER sign base of toes bilaterally    Currently in Pain? --   LE related pain unchanged by report. Pt does not rate numerically today.   Pain Onset --   s/p 44 yrs                       OT Treatments/Exercises (OP) - 03/19/20 0001      ADLs   ADL Education Given Yes      Manual Therapy   Manual Therapy Edema management;Manual Lymphatic Drainage (MLD);Compression Bandaging    Manual therapy comments RLE/RLQ CELLULITIS PRECAUTIONS. NO MLD. NO COMPRESSION TO RLE/RLQ FOR 72 hours after  commencing antiibiotic. OK to treat LLE.    Manual Lymphatic Drainage (MLD)  LLE MLD to LLE/LLQ utilizing modified "short neck sequence" with no strokes to neck in keeping w' LE thyroid precautions. UTILIZED DEEP ABDOMINAL PATHWAYS VIS DIAPHRAGMATIC BREATHING, FUNCTIONAL INGUINAL ln, AND PROGIDED DYNAMIC STROKES TO LEG IN SEGMENTS FROM PROXIMAL TO DISTAL. eXCELLENT TOLERANCE.    Compression Bandaging LLE thigh length gradient compression wraps using short stretch bandages. as established                  OT  Education - 03/19/20 1302    Education Details Initiated intro  level Pt edu re custom compression garment options and OT's initial clinical reasoning regarding practical garment choices.    Person(s) Educated Patient    Methods Explanation;Demonstration;Handout    Comprehension Verbalized understanding;Returned demonstration;Need further instruction               OT Long Term Goals - 03/08/20 1101      OT LONG TERM GOAL #1   Title Pt will be able to apply LLE multi-layer, short stretch compression wraps daily with maximum caregiver assistance, using correct gradient techniques independently to return affected limb/s, as closely as possible, to premorbid size and shape, to limit infection risk, and to improve safe functional mobility and ADLs performance.    Baseline dependent    Time 4    Period Days    Status Revised   Pt has no assistance for wraps at nursing facility. Goal updated to compression wrap tolerance for 23/ 7 during Intensive Phase CDT.     OT LONG TERM GOAL #2   Title Pt will be able to verbalize signs and symptoms of cellulitis infection and identify 4 common lymphedema precautions using printed resource for reference (modified independence) to limit LE progression over time.    Baseline Max A    Time 4    Period Days    Status Achieved      OT LONG TERM GOAL #3   Title Pt will sustain than 85% compliance with all daily LE self-care home program  components throughout Intensive Phase CDT, including impeccable skin care, lymphatic pumping therex,  compression wraps and simple self MLD, to ensure optimal limb volume reduction, to limit infection risk and to limit LE progression.    Baseline dependent    Time 12    Period Weeks    Status Partially Met   Pt is not leaving wraps in place 23/7 as directed at this point. This is partially due to bathing schedule at nursing home, and lack of assistance for reapplying wraps during visit intervals.     OT LONG TERM GOAL #4   Title Pt to achieve at least 10% LLE limb volume reduction during Intensive Phase CDT with Max CG assistance for wrapping, to improve functional performance of basic and instrumental ADLs, and to limit LE progression.    Baseline dependent    Time 12    Period Weeks    Status Partially Met   Met for LLE with A-G reduction of 12/19% today. New Goal 15% bilaterally     OT LONG TERM GOAL #5   Title During self-management phase of CDT Pt will retain limb volume reductions achieved during Intensive Phase CDT with no more than 3% volume increase using all LE self care components  to limit LE progression and further functional decline.    Baseline dependent    Time 6    Period Months    Status On-going                 Plan - 03/19/20 1304    Clinical Impression Statement RLE continues to present with signs/symptoms of cellulitis infection since last visit. Skin below knee remains hot to the touch, reddened, shiney and with what appears to be emerging blisters distally and stretch marks posteriorly. Lymphorrhea is absent. Pt requested OT report continuing symptoms to her daughter, [name], which we did by phone. OT agreed to report ongoing symptoms to referring physician as well. Current Rx  limb, LLE, continues to reduce volumetrically. Pt is aable to lift the LLE with less physical effort during transfers and bed mobility. Hold off on Flexitouch sequential pneumatic compression  device trial until cellulitis is completely resolved. Cont LLE Intensive Phase CDT as per OC. Continue discussion and Pt edu re custom compression garment specifications next visit.           Patient will benefit from skilled therapeutic intervention in order to improve the following deficits and impairments:           Visit Diagnosis: Lymphedema, not elsewhere classified    Problem List Patient Active Problem List   Diagnosis Date Noted  . Iron deficiency 08/06/2018  . History of total left knee replacement (TKR) 07/18/2018  . Rectal bleeding 07/05/2018  . Hemorrhoids 07/05/2018  . Anal itching 07/05/2018  . Rotator cuff arthropathy of right shoulder   . Shoulder arthritis 06/15/2017  . Status post revision of total knee replacement, left 04/29/2017  . Polyethylene liner wear following left total knee arthroplasty requiring isolated polyethylene liner exchange ([place]) 03/02/2017  . 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 Unstable angina ([place]) 05/21/2016  . Chronic diastolic heart failure ([place]) 03/29/2016  . Normal coronary arteries 2009 01/14/2015  . Obesity-BMI 40 01/14/2015  . Restless leg 01/14/2015  . Chest pain 01/14/2015  . Back pain 01/14/2015  . Renal insufficiency 01/14/2015  . Dementia- mild memory issues 01/14/2015  . Occult blood positive stool 12/14/2014  . Anemia 12/14/2014  . Family history of colon cancer 12/14/2014  . Change in bowel habits 12/14/2014  . GERD (gastroesophageal reflux disease) 12/14/2014  . Dyspnea 05/17/2012  . Lymphedema 05/17/2012  . Weight gain 05/17/2012  . HTN (hypertension) 05/17/2012    Andrey Spearman, MS, OTR/L, [facility] 03/19/20 1:13 PM   [place] MAIN [facility] SERVICES [address] [place], [place], [address] Phone: [phone]   Fax:  [phone]  Name: [name] MRN: [id number] Date of Birth: 1946/02/21

## 2020-03-20 ENCOUNTER — Other Ambulatory Visit: Payer: Self-pay

## 2020-03-20 ENCOUNTER — Encounter: Payer: Self-pay | Admitting: Occupational Therapy

## 2020-03-20 ENCOUNTER — Ambulatory Visit: Payer: Medicare Other | Admitting: Occupational Therapy

## 2020-03-20 DIAGNOSIS — I890 Lymphedema, not elsewhere classified: Secondary | ICD-10-CM

## 2020-03-20 NOTE — Therapy (Signed)
[place] MAIN [facility] SERVICES [address] [place], [place], [address] Phone: [phone]   Fax:  [phone]  Occupational Therapy Treatment  Patient Details  Name: [name] MRN: [id number] Date of Birth: [birth year] Referring Provider (OT): Juluis Pitch, MD   Encounter Date: 03/20/2020   OT End of Session - 03/20/20 1308    Visit Number 17    Number of Visits 36    Date for OT Re-Evaluation 05/09/20    OT Start Time 0910    OT Stop Time 1010    OT Time Calculation (min) 60 min    Activity Tolerance Patient tolerated treatment well;No increased pain    Behavior During Therapy WFL for tasks assessed/performed           Past Medical History:  Diagnosis Date  . Anemia   . Anxiety   . Arthritis    "hands, feet" (03/03/2017)  . Brain lesion    2 types  . Cataract   . Cervical spondylosis with myelopathy   . Chest pain    Normal cardiac cath 5/09  . Chronic lower back pain   . CKD (chronic kidney disease), stage III    [name]  . Congestive heart failure (CHF) ([place])    has a Cardiomems implant   . Constipation   . Dementia ([place])   . Depression   . Dumping syndrome   . Dyspnea    with activity  . Edema   . Fatigue   . Fibromyalgia   . GERD (gastroesophageal reflux disease)   . Headache    "w/high CBG" (03/03/2017)  . History of echocardiogram    a. Echo 03/20/16 (done at [facility] in [place], [place]):  mild LVH, EF 83%, normal diastolic function, mild LAE, MAC, RVSP 25 mmHg  . Hyperlipidemia   . Hypertension   . Hypothyroidism   . Lumbar spondylosis   . Lymphedema    seeing specialist for this  . Migraine    "mostly stopped when I changed my diet" (03/03/2017)  . OSA on CPAP   . Pneumonia X 1  . PONV (postoperative nausea and vomiting)   . Restless leg syndrome   . Rheumatoid arthritis ([place])   . Stroke [facility])    TIAs "mini strokes"- unsure of last TIA - pt and daughter deny this  .  Syncope   . Tremors of nervous system   . Type II diabetes mellitus ([place])     Past Surgical History:  Procedure Laterality Date  . APPENDECTOMY  1966  . BACK SURGERY    . CARDIAC CATHETERIZATION N/A 05/25/2016   Procedure: Right/Left Heart Cath and Coronary Angiography;  Surgeon: Larey Dresser, MD;  Location: [place] [facility];  Service: Cardiovascular;  Laterality: N/A;  . CATARACT EXTRACTION W/ INTRAOCULAR LENS  IMPLANT, BILATERAL Bilateral   . COLONOSCOPY    . CORONARY ANGIOGRAM  2009   Normal coronaries  . EXCISION/RELEASE BURSA HIP Bilateral   . I & D KNEE WITH POLY EXCHANGE Left 03/02/2017   Procedure: Left knee Revision of poly liner;  Surgeon: Mcarthur Rossetti, MD;  Location: [place];  Service: Orthopedics;  Laterality: Left;  . JOINT REPLACEMENT    . KNEE ARTHROSCOPY Bilateral   . LAPAROSCOPIC CHOLECYSTECTOMY  2015  . LMF  2017   CardiMEMS HF implant for CHF (measures amount of fluid in the heart)  . LUMBAR DISC SURGERY    . LUMBAR LAMINECTOMY/DECOMPRESSION MICRODISCECTOMY Left  12/2005   L2-3 laminectomy and diskectomy/notes 01/06/2011  . POSTERIOR LUMBAR FUSION  04/2006   [name] 01/06/2011; "put cages in"  . REVERSE SHOULDER ARTHROPLASTY Left 06/15/2017   Procedure: REVERSE LEFT SHOULDER ARTHROPLASTY;  Surgeon: Meredith Pel, MD;  Location: [place];  Service: Orthopedics;  Laterality: Left;  . REVERSE SHOULDER ARTHROPLASTY Right 02/01/2018  . REVERSE SHOULDER ARTHROPLASTY Right 02/01/2018   Procedure: RIGHT REVERSE SHOULDER ARTHROPLASTY;  Surgeon: Meredith Pel, MD;  Location: [place];  Service: Orthopedics;  Laterality: Right;  . REVISION TOTAL KNEE ARTHROPLASTY Left 03/02/2017   poly liner/notes 03/02/2017  . RIGHT HEART CATH N/A 11/13/2016   Procedure: Right Heart Cath with Cardiomems;  Surgeon: Larey Dresser, MD;  Location: [place] [facility];  Service: Cardiovascular;  Laterality: N/A;  . SHOULDER ARTHROSCOPY WITH ROTATOR CUFF REPAIR Right   . SHOULDER  OPEN ROTATOR CUFF REPAIR Left 01/2011   [name] 01/29/2011  . TOTAL ABDOMINAL HYSTERECTOMY    . TOTAL KNEE ARTHROPLASTY Left   . TOTAL KNEE ARTHROPLASTY Right 11/18/2012   Procedure: TOTAL KNEE ARTHROPLASTY;  Surgeon: Yvette Rack., MD;  Location: [place];  Service: Orthopedics;  Laterality: Right;  . TUBAL LIGATION    . UPPER GASTROINTESTINAL ENDOSCOPY    . UPPER GI ENDOSCOPY      There were no vitals filed for this visit.   Subjective Assessment - 03/20/20 0932    Subjective  [name] presents to OT for Rx visit 17/36 to address BLE lymphedema. Pt reports back pain 6/10 this morning. "My (R)  leg ( cellulitis) is a 4. The left leg has no pain in it." Pt reports she started IV antibiotic   last night at around 7 PM, and had a 2nd dose this morning at 6 AM. "It's everyday for a week."    Pertinent History chronic BLE leg swelling and associated pain ~ 40 years; hx herniated intervertebral discL TKA; L total knee revision; HTN, Obesity. dementia; restless leg; CHF; unstable anina; R RC arthroplasty, OA, cervical spondylosis with myelopathy, chronic lower back pain, CKD (Stage?); Fibromyalgia; Hypothyroidism; OSA ( cPap?); hx pneumonia; RA, Type II Diabetes. S/P cardiac cathg, B THA, Lumbar laminectomy, posterior lumbar fusion, B shoulder arthroplasty; total abdominal hysterectomy    Limitations chronic leg swelling and associated pain, difficulty walking, impaired dynamic balance, muscle weakness,    Repetition Increases Symptoms    Special Tests + STEMMER sign base of toes bilaterally    Pain Onset --   s/p 45 yrs                       OT Treatments/Exercises (OP) - 03/20/20 0001      ADLs   ADL Education Given Yes      Manual Therapy   Manual Therapy Edema management;Compression Bandaging;Manual Lymphatic Drainage (MLD)    Manual Lymphatic Drainage (MLD)  LLE MLD to LLE/LLQ utilizing modified "short neck sequence" with no strokes to neck in keeping w' LE thyroid precautions.  UTILIZED DEEP ABDOMINAL PATHWAYS VIS DIAPHRAGMATIC BREATHING, FUNCTIONAL INGUINAL ln, AND PROGIDED DYNAMIC STROKES TO LEG IN SEGMENTS FROM PROXIMAL TO DISTAL. eXCELLENT TOLERANCE.    Compression Bandaging LLE thigh length gradient compression wraps using short stretch bandages. as established                  OT Education - 03/20/20 1308    Education Details Continued skilled Pt/caregiver education  And LE ADL training throughout visit for lymphedema self care/  home program, including compression wrapping, compression garment and device wear/care, lymphatic pumping ther ex, simple self-MLD, and skin care. Discussed progress towards goals.    Person(s) Educated Patient    Methods Explanation;Demonstration;Handout    Comprehension Verbalized understanding;Returned demonstration;Need further instruction               OT Long Term Goals - 03/08/20 1101      OT LONG TERM GOAL #1   Title Pt will be able to apply LLE multi-layer, short stretch compression wraps daily with maximum caregiver assistance, using correct gradient techniques independently to return affected limb/s, as closely as possible, to premorbid size and shape, to limit infection risk, and to improve safe functional mobility and ADLs performance.    Baseline dependent    Time 4    Period Days    Status Revised   Pt has no assistance for wraps at nursing facility. Goal updated to compression wrap tolerance for 23/ 7 during Intensive Phase CDT.     OT LONG TERM GOAL #2   Title Pt will be able to verbalize signs and symptoms of cellulitis infection and identify 4 common lymphedema precautions using printed resource for reference (modified independence) to limit LE progression over time.    Baseline Max A    Time 4    Period Days    Status Achieved      OT LONG TERM GOAL #3   Title Pt will sustain than 85% compliance with all daily LE self-care home program components throughout Intensive Phase CDT, including  impeccable skin care, lymphatic pumping therex,  compression wraps and simple self MLD, to ensure optimal limb volume reduction, to limit infection risk and to limit LE progression.    Baseline dependent    Time 12    Period Weeks    Status Partially Met   Pt is not leaving wraps in place 23/7 as directed at this point. This is partially due to bathing schedule at nursing home, and lack of assistance for reapplying wraps during visit intervals.     OT LONG TERM GOAL #4   Title Pt to achieve at least 10% LLE limb volume reduction during Intensive Phase CDT with Max CG assistance for wrapping, to improve functional performance of basic and instrumental ADLs, and to limit LE progression.    Baseline dependent    Time 12    Period Weeks    Status Partially Met   Met for LLE with A-G reduction of 12/19% today. New Goal 15% bilaterally     OT LONG TERM GOAL #5   Title During self-management phase of CDT Pt will retain limb volume reductions achieved during Intensive Phase CDT with no more than 3% volume increase using all LE self care components  to limit LE progression and further functional decline.    Baseline dependent    Time 6    Period Months    Status On-going                 Plan - 03/20/20 1310    Clinical Impression Statement No visible or palpable response to reported first 2 doses of IV antibiotic started last night. Worry about back MRI resilts and possible Rx limited Pt's abiolity to sleep last night. She is more tired than usual this morning and less vocal during session. LLE response to CDT continues to be very positive with ongoing limb volume reduction and softening subdermal fatty fibrosis. Cont as per OC. Continue to monitor skin condition  closely.           Patient will benefit from skilled therapeutic intervention in order to improve the following deficits and impairments:           Visit Diagnosis: Lymphedema, not elsewhere classified    Problem  List Patient Active Problem List   Diagnosis Date Noted  . Iron deficiency 08/06/2018  . History of total left knee replacement (TKR) 07/18/2018  . Rectal bleeding 07/05/2018  . Hemorrhoids 07/05/2018  . Anal itching 07/05/2018  . Rotator cuff arthropathy of right shoulder   . Shoulder arthritis 06/15/2017  . Status post revision of total knee replacement, left 04/29/2017  . Polyethylene liner wear following left total knee arthroplasty requiring isolated polyethylene liner exchange ([place]) 03/02/2017  . 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 Unstable angina ([place]) 05/21/2016  . Chronic diastolic heart failure ([place]) 03/29/2016  . Normal coronary arteries 2009 01/14/2015  . Obesity-BMI 40 01/14/2015  . Restless leg 01/14/2015  . Chest pain 01/14/2015  . Back pain 01/14/2015  . Renal insufficiency 01/14/2015  . Dementia- mild memory issues 01/14/2015  . Occult blood positive stool 12/14/2014  . Anemia 12/14/2014  . Family history of colon cancer 12/14/2014  . Change in bowel habits 12/14/2014  . GERD (gastroesophageal reflux disease) 12/14/2014  . Dyspnea 05/17/2012  . Lymphedema 05/17/2012  . Weight gain 05/17/2012  . HTN (hypertension) 05/17/2012    Andrey Spearman, MS, OTR/L, [facility] 03/20/20 1:18 PM  [place] MAIN [facility] SERVICES [address] [place], [place], [address] Phone: [phone]   Fax:  [phone]  Name: [name] MRN: [id number] Date of Birth: 06-21-1946

## 2020-03-22 ENCOUNTER — Other Ambulatory Visit: Payer: Self-pay

## 2020-03-22 ENCOUNTER — Ambulatory Visit: Payer: Medicare Other | Admitting: Occupational Therapy

## 2020-03-22 DIAGNOSIS — I890 Lymphedema, not elsewhere classified: Secondary | ICD-10-CM

## 2020-03-22 NOTE — Therapy (Signed)
[place] MAIN [facility] SERVICES [address] [place], [place], [address] Phone: [phone]   Fax:  [phone]  Occupational Therapy Treatment  Patient Details  Name: [name] MRN: [id number] Date of Birth: 1946/05/31 Referring Provider (OT): [name] Pitch, MD   Encounter Date: 03/22/2020   OT End of Session - 03/22/20 1117    Visit Number 18    Number of Visits 36    Date for OT Re-Evaluation 05/09/20    OT Start Time 1108    OT Stop Time 1209    OT Time Calculation (min) 61 min    Activity Tolerance Patient tolerated treatment well;No increased pain    Behavior During Therapy WFL for tasks assessed/performed           Past Medical History:  Diagnosis Date   Anemia    Anxiety    Arthritis    "hands, feet" (03/03/2017)   Brain lesion    2 types   Cataract    Cervical spondylosis with myelopathy    Chest pain    Normal cardiac cath 5/09   Chronic lower back pain    CKD (chronic kidney disease), stage III    [name]   Congestive heart failure (CHF) ([facility])    has a Cardiomems implant    Constipation    Dementia ([place])    Depression    Dumping syndrome    Dyspnea    with activity   Edema    Fatigue    Fibromyalgia    GERD (gastroesophageal reflux disease)    Headache    "w/high CBG" (03/03/2017)   History of echocardiogram    a. Echo 03/20/16 (done at [facility] in [place], [place]):  mild LVH, EF 63%, normal diastolic function, mild LAE, MAC, RVSP 25 mmHg   Hyperlipidemia    Hypertension    Hypothyroidism    Lumbar spondylosis    Lymphedema    seeing specialist for this   Migraine    "mostly stopped when I changed my diet" (03/03/2017)   OSA on CPAP    Pneumonia X 1   PONV (postoperative nausea and vomiting)    Restless leg syndrome    Rheumatoid arthritis ([place])    Stroke ([place])    TIAs "mini strokes"- unsure of last TIA - pt and daughter deny this    Syncope    Tremors of nervous system    Type II diabetes mellitus ([place])     Past Surgical History:  Procedure Laterality Date   APPENDECTOMY  1966   BACK SURGERY     CARDIAC CATHETERIZATION N/A 05/25/2016   Procedure: Right/Left Heart Cath and Coronary Angiography;  Surgeon: [name] Dresser, MD;  Location: [place] [facility];  Service: Cardiovascular;  Laterality: N/A;   CATARACT EXTRACTION W/ INTRAOCULAR LENS  IMPLANT, BILATERAL Bilateral    COLONOSCOPY     CORONARY ANGIOGRAM  2009   Normal coronaries   EXCISION/RELEASE BURSA HIP Bilateral    I & D KNEE WITH POLY EXCHANGE Left 03/02/2017   Procedure: Left knee Revision of poly liner;  Surgeon: [name] Rossetti, MD;  Location: [place];  Service: Orthopedics;  Laterality: Left;   JOINT REPLACEMENT     KNEE ARTHROSCOPY Bilateral    LAPAROSCOPIC CHOLECYSTECTOMY  2015   LMF  2017   CardiMEMS HF implant for CHF (measures amount of fluid in the heart)   LUMBAR DISC SURGERY     LUMBAR LAMINECTOMY/DECOMPRESSION MICRODISCECTOMY Left  12/2005   L2-3 laminectomy and diskectomy/notes 01/06/2011   POSTERIOR LUMBAR FUSION  04/2006   [name] 01/06/2011; "put cages in"   REVERSE SHOULDER ARTHROPLASTY Left 06/15/2017   Procedure: REVERSE LEFT SHOULDER ARTHROPLASTY;  Surgeon: [name] Pel, MD;  Location: [place];  Service: Orthopedics;  Laterality: Left;   REVERSE SHOULDER ARTHROPLASTY Right 02/01/2018   REVERSE SHOULDER ARTHROPLASTY Right 02/01/2018   Procedure: RIGHT REVERSE SHOULDER ARTHROPLASTY;  Surgeon: [name] Pel, MD;  Location: [place];  Service: Orthopedics;  Laterality: Right;   REVISION TOTAL KNEE ARTHROPLASTY Left 03/02/2017   poly liner/notes 03/02/2017   RIGHT HEART CATH N/A 11/13/2016   Procedure: Right Heart Cath with Cardiomems;  Surgeon: [name] Dresser, MD;  Location: [place] [facility];  Service: Cardiovascular;  Laterality: N/A;   SHOULDER ARTHROSCOPY WITH ROTATOR CUFF REPAIR Right    SHOULDER  OPEN ROTATOR CUFF REPAIR Left 01/2011   [name] 01/29/2011   TOTAL ABDOMINAL HYSTERECTOMY     TOTAL KNEE ARTHROPLASTY Left    TOTAL KNEE ARTHROPLASTY Right 11/18/2012   Procedure: TOTAL KNEE ARTHROPLASTY;  Surgeon: [name]., MD;  Location: [place];  Service: Orthopedics;  Laterality: Right;   TUBAL LIGATION     UPPER GASTROINTESTINAL ENDOSCOPY     UPPER GI ENDOSCOPY      There were no vitals filed for this visit.   Subjective Assessment - 03/22/20 1055    Subjective  [name] presents to OT for Rx visit 18/36 to address BLE lymphedema. Pt reports back pain 6/10 R leg pain this morning, " from ankle about half way up". It feels like it's burning and aching.    Pertinent History chronic BLE leg swelling and associated pain ~ 40 years; hx herniated intervertebral discL TKA; L total knee revision; HTN, Obesity. dementia; restless leg; CHF; unstable anina; R RC arthroplasty, OA, cervical spondylosis with myelopathy, chronic lower back pain, CKD (Stage?); Fibromyalgia; Hypothyroidism; OSA ( cPap?); hx pneumonia; RA, Type II Diabetes. S/P cardiac cathg, B THA, Lumbar laminectomy, posterior lumbar fusion, B shoulder arthroplasty; total abdominal hysterectomy    Limitations chronic leg swelling and associated pain, difficulty walking, impaired dynamic balance, muscle weakness,    Repetition Increases Symptoms    Special Tests + STEMMER sign base of toes bilaterally    Pain Onset --   s/p 45 yrs                       OT Treatments/Exercises (OP) - 03/22/20 0001      ADLs   ADL Education Given Yes      Manual Therapy   Manual Therapy Edema management;Compression Bandaging;Manual Lymphatic Drainage (MLD)    Manual Lymphatic Drainage (MLD)  LLE MLD to LLE/LLQ utilizing modified "short neck sequence" with no strokes to neck in keeping w' LE thyroid precautions. UTILIZED DEEP ABDOMINAL PATHWAYS VIS DIAPHRAGMATIC BREATHING, FUNCTIONAL INGUINAL ln, AND PROGIDED DYNAMIC STROKES TO  LEG IN SEGMENTS FROM PROXIMAL TO DISTAL. eXCELLENT TOLERANCE.    Compression Bandaging LLE knee length wrap only. Pt brought only 1 of 3 Rosidal foam to clinic.                  OT Education - 03/22/20 1212    Education Details Continued skilled Pt/caregiver education  And LE ADL training throughout visit for lymphedema self care/ home program, including compression wrapping, compression garment and device wear/care, lymphatic pumping ther ex, simple self-MLD, and skin care. Discussed progress towards goals.  Person(s) Educated Patient    Methods Explanation;Demonstration;Handout    Comprehension Verbalized understanding;Returned demonstration;Need further instruction               OT Long Term Goals - 03/08/20 1101      OT LONG TERM GOAL #1   Title Pt will be able to apply LLE multi-layer, short stretch compression wraps daily with maximum caregiver assistance, using correct gradient techniques independently to return affected limb/s, as closely as possible, to premorbid size and shape, to limit infection risk, and to improve safe functional mobility and ADLs performance.    Baseline dependent    Time 4    Period Days    Status Revised   Pt has no assistance for wraps at nursing facility. Goal updated to compression wrap tolerance for 23/ 7 during Intensive Phase CDT.     OT LONG TERM GOAL #2   Title Pt will be able to verbalize signs and symptoms of cellulitis infection and identify 4 common lymphedema precautions using printed resource for reference (modified independence) to limit LE progression over time.    Baseline Max A    Time 4    Period Days    Status Achieved      OT LONG TERM GOAL #3   Title Pt will sustain than 85% compliance with all daily LE self-care home program components throughout Intensive Phase CDT, including impeccable skin care, lymphatic pumping therex,  compression wraps and simple self MLD, to ensure optimal limb volume reduction, to limit  infection risk and to limit LE progression.    Baseline dependent    Time 12    Period Weeks    Status Partially Met   Pt is not leaving wraps in place 23/7 as directed at this point. This is partially due to bathing schedule at nursing home, and lack of assistance for reapplying wraps during visit intervals.     OT LONG TERM GOAL #4   Title Pt to achieve at least 10% LLE limb volume reduction during Intensive Phase CDT with Max CG assistance for wrapping, to improve functional performance of basic and instrumental ADLs, and to limit LE progression.    Baseline dependent    Time 12    Period Weeks    Status Partially Met   Met for LLE with A-G reduction of 12/19% today. New Goal 15% bilaterally     OT LONG TERM GOAL #5   Title During self-management phase of CDT Pt will retain limb volume reductions achieved during Intensive Phase CDT with no more than 3% volume increase using all LE self care components  to limit LE progression and further functional decline.    Baseline dependent    Time 6    Period Months    Status On-going                 Plan - 03/22/20 1214    Clinical Impression Statement RLE continuesto present with redness and heat below the knee. Swelling appearls slightly decreased . Recorded this observation on SNF note. Pt tolerated MLD and skin care to LLE without increased pain. Only able to apply knee length LLE wrap today as Pt did not bring all supplies to clinic. Cont as per POC.           Patient will benefit from skilled therapeutic intervention in order to improve the following deficits and impairments:           Visit Diagnosis: Lymphedema, not elsewhere classified  Problem List Patient Active Problem List   Diagnosis Date Noted   Iron deficiency 08/06/2018   History of total left knee replacement (TKR) 07/18/2018   Rectal bleeding 07/05/2018   Hemorrhoids 07/05/2018   Anal itching 07/05/2018   Rotator cuff arthropathy of right  shoulder    Shoulder arthritis 06/15/2017   Status post revision of total knee replacement, left 04/29/2017   Polyethylene liner wear following left total knee arthroplasty requiring isolated polyethylene liner exchange ([place]) 03/02/2017   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[place]) 03/02/2017   Unstable angina ([place]) 05/21/2016   Chronic diastolic heart failure ([place]) 03/29/2016   Normal coronary arteries 2009 01/14/2015   Obesity-BMI 40 01/14/2015   Restless leg 01/14/2015   Chest pain 01/14/2015   Back pain 01/14/2015   Renal insufficiency 01/14/2015   Dementia- mild memory issues 01/14/2015   Occult blood positive stool 12/14/2014   Anemia 12/14/2014   Family history of colon cancer 12/14/2014   Change in bowel habits 12/14/2014   GERD (gastroesophageal reflux disease) 12/14/2014   Dyspnea 05/17/2012   Lymphedema 05/17/2012   Weight gain 05/17/2012   HTN (hypertension) 05/17/2012    Andrey Spearman, MS, OTR/L, CLT-[facility] 03/22/20 12:16 PM   [facility] [place] MAIN [facility] SERVICES [address] [place], [place], [address] Phone: [phone]   Fax:  [phone]  Name: [name] MRN: [id number] Date of Birth: [birth year]

## 2020-03-23 ENCOUNTER — Encounter: Payer: Self-pay | Admitting: Orthopedic Surgery

## 2020-03-23 NOTE — Progress Notes (Signed)
Office Visit Note   Patient: [name]           Date of Birth: 1945/10/07           MRN: [id number] Visit Date: 03/18/2020 Requested by: [name],  [place] [address] PCP: [name] Pitch, MD  Subjective: Chief Complaint  Patient presents with  . Neck - Pain, Follow-up    MRI Cervical Spine Review    HPI: [name] is a 74 year old patient with neck pain.  Is very painful for her to rotate her head to the right.  Left elbow is giving her also some issues.  She has a chronic acromial fracture status post reverse replacement on that left-hand side.  MRI scan shows at C3-4 moderate to severe stenosis with severe right greater than left foraminal stenosis.  At C5-6 there is also moderate stenosis and severe bilateral foraminal stenosis.  At C6-7 right greater than left severe bilateral foraminal stenosis.  At C4-5 there is unchanged right-sided foraminal stenosis.              ROS: All systems reviewed are negative as they relate to the chief complaint within the history of present illness.  Patient denies  fevers or chills.   Assessment & Plan: Visit Diagnoses:  1. Neck pain   2. Radicular pain in left arm     Plan: Impression is radicular pain left arm.  Reverse replacement has become less painful over time on that left-hand side but based on her neck pain and cervical spine MRI results I think she has radiculopathy which is relatively severe.  Discussed with her operative and nonoperative treatment options.  She has multiple level involvement.  She wants to be considered for surgical evaluation and request referral to [name] which we will arrange.  Follow-up with [place] as needed.  Follow-Up Instructions: Return if symptoms worsen or fail to improve.   Orders:  Orders Placed This Encounter  Procedures  . Ambulatory referral to Neurosurgery   No orders of the defined types were placed in this encounter.     Procedures: No procedures  performed   Clinical Data: No additional findings.  Objective: Vital Signs: There were no vitals taken for this visit.  Physical Exam:   Constitutional: Patient appears well-developed HEENT:  Head: Normocephalic Eyes:EOM are normal Neck: Normal range of motion Cardiovascular: Normal rate Pulmonary/chest: Effort normal Neurologic: Patient is alert Skin: Skin is warm Psychiatric: Patient has normal mood and affect    Ortho Exam: Ortho exam demonstrates some pain with rotation of the head to the right and left.  Motor sensory function hand is intact with 5 out of 5 grip EPL FPL interosseous wrist flexion wrist extension biceps triceps strength.  Deltoid fires bilaterally.  Patient does have diminished active range of motion actively on the left-hand side.  Specialty Comments:  No specialty comments available.  Imaging: No results found.   PMFS History: Patient Active Problem List   Diagnosis Date Noted  . Iron deficiency 08/06/2018  . History of total left knee replacement (TKR) 07/18/2018  . Rectal bleeding 07/05/2018  . Hemorrhoids 07/05/2018  . Anal itching 07/05/2018  . Rotator cuff arthropathy of right shoulder   . Shoulder arthritis 06/15/2017  . Status post revision of total knee replacement, left 04/29/2017  . Polyethylene liner wear following left total knee arthroplasty requiring isolated polyethylene liner exchange ([place]) 03/02/2017  . 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  Unstable angina ([place]) 05/21/2016  . Chronic diastolic heart failure ([place]) 03/29/2016  . Normal coronary arteries 2009 01/14/2015  . Obesity-BMI 40 01/14/2015  . Restless leg 01/14/2015  . Chest pain 01/14/2015  . Back pain 01/14/2015  . Renal insufficiency 01/14/2015  . Dementia- mild memory issues 01/14/2015  . Occult blood positive stool 12/14/2014  . Anemia 12/14/2014  . Family history of colon cancer 12/14/2014  . Change in bowel habits 12/14/2014    . GERD (gastroesophageal reflux disease) 12/14/2014  . Dyspnea 05/17/2012  . Lymphedema 05/17/2012  . Weight gain 05/17/2012  . HTN (hypertension) 05/17/2012   Past Medical History:  Diagnosis Date  . Anemia   . Anxiety   . Arthritis    "hands, feet" (03/03/2017)  . Brain lesion    2 types  . Cataract   . Cervical spondylosis with myelopathy   . Chest pain    Normal cardiac cath 5/09  . Chronic lower back pain   . CKD (chronic kidney disease), stage III    [name]  . Congestive heart failure (CHF) ([place])    has a Cardiomems implant   . Constipation   . Dementia ([place])   . Depression   . Dumping syndrome   . Dyspnea    with activity  . Edema   . Fatigue   . Fibromyalgia   . GERD (gastroesophageal reflux disease)   . Headache    "w/high CBG" (03/03/2017)  . History of echocardiogram    a. Echo 03/20/16 (done at [facility] in [place], [place]):  mild LVH, EF 69%, normal diastolic function, mild LAE, MAC, RVSP 25 mmHg  . Hyperlipidemia   . Hypertension   . Hypothyroidism   . Lumbar spondylosis   . Lymphedema    seeing specialist for this  . Migraine    "mostly stopped when I changed my diet" (03/03/2017)  . OSA on CPAP   . Pneumonia X 1  . PONV (postoperative nausea and vomiting)   . Restless leg syndrome   . Rheumatoid arthritis ([place])   . Stroke [facility])    TIAs "mini strokes"- unsure of last TIA - pt and daughter deny this  . Syncope   . Tremors of nervous system   . Type II diabetes mellitus (HCC)     Family History  Problem Relation Age of Onset  . Heart disease Mother   . Heart failure Mother   . Kidney disease Mother   . Lung cancer Father   . Colon cancer Brother   . Cancer Brother        malignant thymoma  . Heart disease Brother   . Tremor Maternal Grandmother 90  . Ovarian cancer Maternal Grandmother   . Uterine cancer Maternal Grandmother   . Irritable bowel syndrome Daughter   . Esophageal cancer Neg Hx   . Stomach cancer Neg  Hx   . Inflammatory bowel disease Neg Hx   . Liver disease Neg Hx   . Pancreatic cancer Neg Hx   . Rectal cancer Neg Hx     Past Surgical History:  Procedure Laterality Date  . APPENDECTOMY  1966  . BACK SURGERY    . CARDIAC CATHETERIZATION N/A 05/25/2016   Procedure: Right/Left Heart Cath and Coronary Angiography;  Surgeon: Larey Dresser, MD;  Location: [place] [facility];  Service: Cardiovascular;  Laterality: N/A;  . CATARACT EXTRACTION W/ INTRAOCULAR LENS  IMPLANT, BILATERAL Bilateral   . COLONOSCOPY    . CORONARY  ANGIOGRAM  2009   Normal coronaries  . EXCISION/RELEASE BURSA HIP Bilateral   . I & D KNEE WITH POLY EXCHANGE Left 03/02/2017   Procedure: Left knee Revision of poly liner;  Surgeon: Mcarthur Rossetti, MD;  Location: [place];  Service: Orthopedics;  Laterality: Left;  . JOINT REPLACEMENT    . KNEE ARTHROSCOPY Bilateral   . LAPAROSCOPIC CHOLECYSTECTOMY  2015  . LMF  2017   CardiMEMS HF implant for CHF (measures amount of fluid in the heart)  . LUMBAR DISC SURGERY    . LUMBAR LAMINECTOMY/DECOMPRESSION MICRODISCECTOMY Left 12/2005   L2-3 laminectomy and diskectomy/notes 01/06/2011  . POSTERIOR LUMBAR FUSION  04/2006   [name] 01/06/2011; "put cages in"  . REVERSE SHOULDER ARTHROPLASTY Left 06/15/2017   Procedure: REVERSE LEFT SHOULDER ARTHROPLASTY;  Surgeon: Meredith Pel, MD;  Location: [place];  Service: Orthopedics;  Laterality: Left;  . REVERSE SHOULDER ARTHROPLASTY Right 02/01/2018  . REVERSE SHOULDER ARTHROPLASTY Right 02/01/2018   Procedure: RIGHT REVERSE SHOULDER ARTHROPLASTY;  Surgeon: Meredith Pel, MD;  Location: [place];  Service: Orthopedics;  Laterality: Right;  . REVISION TOTAL KNEE ARTHROPLASTY Left 03/02/2017   poly liner/notes 03/02/2017  . RIGHT HEART CATH N/A 11/13/2016   Procedure: Right Heart Cath with Cardiomems;  Surgeon: Larey Dresser, MD;  Location: [place] [facility];  Service: Cardiovascular;  Laterality: N/A;  . SHOULDER  ARTHROSCOPY WITH ROTATOR CUFF REPAIR Right   . SHOULDER OPEN ROTATOR CUFF REPAIR Left 01/2011   [name] 01/29/2011  . TOTAL ABDOMINAL HYSTERECTOMY    . TOTAL KNEE ARTHROPLASTY Left   . TOTAL KNEE ARTHROPLASTY Right 11/18/2012   Procedure: TOTAL KNEE ARTHROPLASTY;  Surgeon: Yvette Rack., MD;  Location: [place];  Service: Orthopedics;  Laterality: Right;  . TUBAL LIGATION    . UPPER GASTROINTESTINAL ENDOSCOPY    . UPPER GI ENDOSCOPY     Social History   Occupational History  . Occupation: retired  Tobacco Use  . Smoking status: Never Smoker  . Smokeless tobacco: Never Used  Vaping Use  . Vaping Use: Never used  Substance and Sexual Activity  . Alcohol use: No    Alcohol/week: 0.0 standard drinks  . Drug use: No  . Sexual activity: Not Currently

## 2020-03-26 ENCOUNTER — Ambulatory Visit: Payer: Medicare Other | Attending: Family Medicine | Admitting: Occupational Therapy

## 2020-03-26 ENCOUNTER — Other Ambulatory Visit: Payer: Self-pay

## 2020-03-26 ENCOUNTER — Encounter: Payer: Self-pay | Admitting: Occupational Therapy

## 2020-03-26 DIAGNOSIS — I890 Lymphedema, not elsewhere classified: Secondary | ICD-10-CM | POA: Diagnosis not present

## 2020-03-26 NOTE — Therapy (Signed)
[place] MAIN [facility] SERVICES [address] [place], [place], [address] Phone: [phone]   Fax:  [phone]  Occupational Therapy Treatment  Patient Details  Name: [name] MRN: [id number] Date of Birth: 12/15/1945 Referring Provider (OT): Juluis Pitch, MD   Encounter Date: 03/26/2020   OT End of Session - 03/26/20 1611    Visit Number 19    Number of Visits 36    Date for OT Re-Evaluation 05/09/20    OT Start Time 1002    OT Stop Time 1115    OT Time Calculation (min) 73 min    Activity Tolerance Patient tolerated treatment well;No increased pain    Behavior During Therapy WFL for tasks assessed/performed           Past Medical History:  Diagnosis Date  . Anemia   . Anxiety   . Arthritis    "hands, feet" (03/03/2017)  . Brain lesion    2 types  . Cataract   . Cervical spondylosis with myelopathy   . Chest pain    Normal cardiac cath 5/09  . Chronic lower back pain   . CKD (chronic kidney disease), stage III    [name]  . Congestive heart failure (CHF) ([place])    has a Cardiomems implant   . Constipation   . Dementia ([place])   . Depression   . Dumping syndrome   . Dyspnea    with activity  . Edema   . Fatigue   . Fibromyalgia   . GERD (gastroesophageal reflux disease)   . Headache    "w/high CBG" (03/03/2017)  . History of echocardiogram    a. Echo 03/20/16 (done at [facility] in [place], [place]):  mild LVH, EF 24%, normal diastolic function, mild LAE, MAC, RVSP 25 mmHg  . Hyperlipidemia   . Hypertension   . Hypothyroidism   . Lumbar spondylosis   . Lymphedema    seeing specialist for this  . Migraine    "mostly stopped when I changed my diet" (03/03/2017)  . OSA on CPAP   . Pneumonia X 1  . PONV (postoperative nausea and vomiting)   . Restless leg syndrome   . Rheumatoid arthritis ([place])   . Stroke [facility])    TIAs "mini strokes"- unsure of last TIA - pt and daughter deny this  .  Syncope   . Tremors of nervous system   . Type II diabetes mellitus ([place])     Past Surgical History:  Procedure Laterality Date  . APPENDECTOMY  1966  . BACK SURGERY    . CARDIAC CATHETERIZATION N/A 05/25/2016   Procedure: Right/Left Heart Cath and Coronary Angiography;  Surgeon: Larey Dresser, MD;  Location: [place] [facility];  Service: Cardiovascular;  Laterality: N/A;  . CATARACT EXTRACTION W/ INTRAOCULAR LENS  IMPLANT, BILATERAL Bilateral   . COLONOSCOPY    . CORONARY ANGIOGRAM  2009   Normal coronaries  . EXCISION/RELEASE BURSA HIP Bilateral   . I & D KNEE WITH POLY EXCHANGE Left 03/02/2017   Procedure: Left knee Revision of poly liner;  Surgeon: Mcarthur Rossetti, MD;  Location: [place];  Service: Orthopedics;  Laterality: Left;  . JOINT REPLACEMENT    . KNEE ARTHROSCOPY Bilateral   . LAPAROSCOPIC CHOLECYSTECTOMY  2015  . LMF  2017   CardiMEMS HF implant for CHF (measures amount of fluid in the heart)  . LUMBAR DISC SURGERY    . LUMBAR LAMINECTOMY/DECOMPRESSION MICRODISCECTOMY Left  12/2005   L2-3 laminectomy and diskectomy/notes 01/06/2011  . POSTERIOR LUMBAR FUSION  04/2006   [name] 01/06/2011; "put cages in"  . REVERSE SHOULDER ARTHROPLASTY Left 06/15/2017   Procedure: REVERSE LEFT SHOULDER ARTHROPLASTY;  Surgeon: Meredith Pel, MD;  Location: [place];  Service: Orthopedics;  Laterality: Left;  . REVERSE SHOULDER ARTHROPLASTY Right 02/01/2018  . REVERSE SHOULDER ARTHROPLASTY Right 02/01/2018   Procedure: RIGHT REVERSE SHOULDER ARTHROPLASTY;  Surgeon: Meredith Pel, MD;  Location: [place];  Service: Orthopedics;  Laterality: Right;  . REVISION TOTAL KNEE ARTHROPLASTY Left 03/02/2017   poly liner/notes 03/02/2017  . RIGHT HEART CATH N/A 11/13/2016   Procedure: Right Heart Cath with Cardiomems;  Surgeon: Larey Dresser, MD;  Location: [place] [facility];  Service: Cardiovascular;  Laterality: N/A;  . SHOULDER ARTHROSCOPY WITH ROTATOR CUFF REPAIR Right   . SHOULDER  OPEN ROTATOR CUFF REPAIR Left 01/2011   [name] 01/29/2011  . TOTAL ABDOMINAL HYSTERECTOMY    . TOTAL KNEE ARTHROPLASTY Left   . TOTAL KNEE ARTHROPLASTY Right 11/18/2012   Procedure: TOTAL KNEE ARTHROPLASTY;  Surgeon: Yvette Rack., MD;  Location: [place];  Service: Orthopedics;  Laterality: Right;  . TUBAL LIGATION    . UPPER GASTROINTESTINAL ENDOSCOPY    . UPPER GI ENDOSCOPY      There were no vitals filed for this visit.   Subjective Assessment - 03/26/20 1016    Subjective  [name] presents to OT for Rx visit 19/36 to address BLE lymphedema. Pt reports neck and L shoulder pain 7/10 . Leg pain 2/10. Pt reports she's been unwrapped since Saturday morning. Pt expresses concern re status of LLE cellulitis infection.    Pertinent History chronic BLE leg swelling and associated pain ~ 40 years; hx herniated intervertebral discL TKA; L total knee revision; HTN, Obesity. dementia; restless leg; CHF; unstable anina; R RC arthroplasty, OA, cervical spondylosis with myelopathy, chronic lower back pain, CKD (Stage?); Fibromyalgia; Hypothyroidism; OSA ( cPap?); hx pneumonia; RA, Type II Diabetes. S/P cardiac cathg, B THA, Lumbar laminectomy, posterior lumbar fusion, B shoulder arthroplasty; total abdominal hysterectomy    Limitations chronic leg swelling and associated pain, difficulty walking, impaired dynamic balance, muscle weakness,    Repetition Increases Symptoms    Special Tests + STEMMER sign base of toes bilaterally    Pain Onset --   s/p 45 yrs                       OT Treatments/Exercises (OP) - 03/26/20 0001      ADLs   ADL Education Given Yes      Manual Therapy   Manual Therapy Edema management;Compression Bandaging;Manual Lymphatic Drainage (MLD)    Manual Lymphatic Drainage (MLD)  LLE MLD to LLE/LLQ utilizing modified "short neck sequence" with no strokes to neck in keeping w' LE thyroid precautions. UTILIZED DEEP ABDOMINAL PATHWAYS VIS DIAPHRAGMATIC BREATHING,  FUNCTIONAL INGUINAL ln, AND PROGIDED DYNAMIC STROKES TO LEG IN SEGMENTS FROM PROXIMAL TO DISTAL. eXCELLENT TOLERANCE.    Compression Bandaging LLE knee length wrap only. Pt brought only 1 of 3 Rosidal foam to clinic.                  OT Education - 03/26/20 1618    Education Details Continued skilled Pt/caregiver education  And LE ADL training throughout visit for lymphedema self care/ home program, including compression wrapping, compression garment and device wear/care, lymphatic pumping ther ex, simple self-MLD, and skin care. Discussed progress  towards goals.    Person(s) Educated Patient    Methods Explanation;Demonstration;Handout    Comprehension Verbalized understanding;Returned demonstration;Need further instruction               OT Long Term Goals - 03/08/20 1101      OT LONG TERM GOAL #1   Title Pt will be able to apply LLE multi-layer, short stretch compression wraps daily with maximum caregiver assistance, using correct gradient techniques independently to return affected limb/s, as closely as possible, to premorbid size and shape, to limit infection risk, and to improve safe functional mobility and ADLs performance.    Baseline dependent    Time 4    Period Days    Status Revised   Pt has no assistance for wraps at nursing facility. Goal updated to compression wrap tolerance for 23/ 7 during Intensive Phase CDT.     OT LONG TERM GOAL #2   Title Pt will be able to verbalize signs and symptoms of cellulitis infection and identify 4 common lymphedema precautions using printed resource for reference (modified independence) to limit LE progression over time.    Baseline Max A    Time 4    Period Days    Status Achieved      OT LONG TERM GOAL #3   Title Pt will sustain than 85% compliance with all daily LE self-care home program components throughout Intensive Phase CDT, including impeccable skin care, lymphatic pumping therex,  compression wraps and simple self  MLD, to ensure optimal limb volume reduction, to limit infection risk and to limit LE progression.    Baseline dependent    Time 12    Period Weeks    Status Partially Met   Pt is not leaving wraps in place 23/7 as directed at this point. This is partially due to bathing schedule at nursing home, and lack of assistance for reapplying wraps during visit intervals.     OT LONG TERM GOAL #4   Title Pt to achieve at least 10% LLE limb volume reduction during Intensive Phase CDT with Max CG assistance for wrapping, to improve functional performance of basic and instrumental ADLs, and to limit LE progression.    Baseline dependent    Time 12    Period Weeks    Status Partially Met   Met for LLE with A-G reduction of 12/19% today. New Goal 15% bilaterally     OT LONG TERM GOAL #5   Title During self-management phase of CDT Pt will retain limb volume reductions achieved during Intensive Phase CDT with no more than 3% volume increase using all LE self care components  to limit LE progression and further functional decline.    Baseline dependent    Time 6    Period Months    Status On-going                 Plan - 03/26/20 1612    Clinical Impression Statement RLE presents with mildly decreased swelling, redness and skin temperature, but these signs/ symptoms of infection persist. Pt continues with 2/10 leg pain below the knees today. LLE presenting with increased swelling and tissue density this morning as Pt was unabl;e to remain wrapped over the weekend after incontinence episode by report. Pt had no difficulty tolerating MLD and compression to LLE today. Cont as per OPPC. Pt continues to demonstrte progress towards all OT goals for LE care. Volumetrics of LLE next visit for progress reprt.  Patient will benefit from skilled therapeutic intervention in order to improve the following deficits and impairments:           Visit Diagnosis: Lymphedema, not elsewhere  classified    Problem List Patient Active Problem List   Diagnosis Date Noted  . Iron deficiency 08/06/2018  . History of total left knee replacement (TKR) 07/18/2018  . Rectal bleeding 07/05/2018  . Hemorrhoids 07/05/2018  . Anal itching 07/05/2018  . Rotator cuff arthropathy of right shoulder   . Shoulder arthritis 06/15/2017  . Status post revision of total knee replacement, left 04/29/2017  . Polyethylene liner wear following left total knee arthroplasty requiring isolated polyethylene liner exchange ([place]) 03/02/2017  . 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 Unstable angina ([place]) 05/21/2016  . Chronic diastolic heart failure ([place]) 03/29/2016  . Normal coronary arteries 2009 01/14/2015  . Obesity-BMI 40 01/14/2015  . Restless leg 01/14/2015  . Chest pain 01/14/2015  . Back pain 01/14/2015  . Renal insufficiency 01/14/2015  . Dementia- mild memory issues 01/14/2015  . Occult blood positive stool 12/14/2014  . Anemia 12/14/2014  . Family history of colon cancer 12/14/2014  . Change in bowel habits 12/14/2014  . GERD (gastroesophageal reflux disease) 12/14/2014  . Dyspnea 05/17/2012  . Lymphedema 05/17/2012  . Weight gain 05/17/2012  . HTN (hypertension) 05/17/2012    Andrey Spearman, MS, OTR/L, [facility] 03/26/20 4:19 PM   [place] MAIN [facility] SERVICES [address] [place], [place], [address] Phone: [phone]   Fax:  [phone]  Name: [name] MRN: [id number] Date of Birth: [birth year]

## 2020-03-28 ENCOUNTER — Other Ambulatory Visit: Payer: Self-pay

## 2020-03-28 ENCOUNTER — Ambulatory Visit: Payer: Medicare Other | Admitting: Occupational Therapy

## 2020-03-28 DIAGNOSIS — I890 Lymphedema, not elsewhere classified: Secondary | ICD-10-CM | POA: Diagnosis not present

## 2020-03-28 NOTE — Therapy (Signed)
[place] MAIN [facility] SERVICES [address] [place], [place], [address] Phone: [phone]   Fax:  [phone]  Occupational Therapy Treatment Note and Progress Report: Lymphedema Care  Patient Details  Name: [name] MRN: [id number] Date of Birth: Jan 21, 1946 Referring Provider (OT): Juluis Pitch, MD   Encounter Date: 03/28/2020   OT End of Session - 03/28/20 1341    Visit Number 20    Number of Visits 36    Date for OT Re-Evaluation 05/09/20    OT Start Time 1008    OT Stop Time 1115    OT Time Calculation (min) 67 min    Activity Tolerance Patient tolerated treatment well;No increased pain    Behavior During Therapy WFL for tasks assessed/performed           Past Medical History:  Diagnosis Date  . Anemia   . Anxiety   . Arthritis    "hands, feet" (03/03/2017)  . Brain lesion    2 types  . Cataract   . Cervical spondylosis with myelopathy   . Chest pain    Normal cardiac cath 5/09  . Chronic lower back pain   . CKD (chronic kidney disease), stage III    [name]  . Congestive heart failure (CHF) ([place])    has a Cardiomems implant   . Constipation   . Dementia ([place])   . Depression   . Dumping syndrome   . Dyspnea    with activity  . Edema   . Fatigue   . Fibromyalgia   . GERD (gastroesophageal reflux disease)   . Headache    "w/high CBG" (03/03/2017)  . History of echocardiogram    a. Echo 03/20/16 (done at [facility] in [place], [place]):  mild LVH, EF 16%, normal diastolic function, mild LAE, MAC, RVSP 25 mmHg  . Hyperlipidemia   . Hypertension   . Hypothyroidism   . Lumbar spondylosis   . Lymphedema    seeing specialist for this  . Migraine    "mostly stopped when I changed my diet" (03/03/2017)  . OSA on CPAP   . Pneumonia X 1  . PONV (postoperative nausea and vomiting)   . Restless leg syndrome   . Rheumatoid arthritis ([place])   . Stroke [facility])    TIAs "mini strokes"- unsure of  last TIA - pt and daughter deny this  . Syncope   . Tremors of nervous system   . Type II diabetes mellitus ([place])     Past Surgical History:  Procedure Laterality Date  . APPENDECTOMY  1966  . BACK SURGERY    . CARDIAC CATHETERIZATION N/A 05/25/2016   Procedure: Right/Left Heart Cath and Coronary Angiography;  Surgeon: Larey Dresser, MD;  Location: [place] [facility];  Service: Cardiovascular;  Laterality: N/A;  . CATARACT EXTRACTION W/ INTRAOCULAR LENS  IMPLANT, BILATERAL Bilateral   . COLONOSCOPY    . CORONARY ANGIOGRAM  2009   Normal coronaries  . EXCISION/RELEASE BURSA HIP Bilateral   . I & D KNEE WITH POLY EXCHANGE Left 03/02/2017   Procedure: Left knee Revision of poly liner;  Surgeon: Mcarthur Rossetti, MD;  Location: [place];  Service: Orthopedics;  Laterality: Left;  . JOINT REPLACEMENT    . KNEE ARTHROSCOPY Bilateral   . LAPAROSCOPIC CHOLECYSTECTOMY  2015  . LMF  2017   CardiMEMS HF implant for CHF (measures amount of fluid in the heart)  . LUMBAR DISC SURGERY    .  LUMBAR LAMINECTOMY/DECOMPRESSION MICRODISCECTOMY Left 12/2005   L2-3 laminectomy and diskectomy/notes 01/06/2011  . POSTERIOR LUMBAR FUSION  04/2006   [name] 01/06/2011; "put cages in"  . REVERSE SHOULDER ARTHROPLASTY Left 06/15/2017   Procedure: REVERSE LEFT SHOULDER ARTHROPLASTY;  Surgeon: Meredith Pel, MD;  Location: [place];  Service: Orthopedics;  Laterality: Left;  . REVERSE SHOULDER ARTHROPLASTY Right 02/01/2018  . REVERSE SHOULDER ARTHROPLASTY Right 02/01/2018   Procedure: RIGHT REVERSE SHOULDER ARTHROPLASTY;  Surgeon: Meredith Pel, MD;  Location: [place];  Service: Orthopedics;  Laterality: Right;  . REVISION TOTAL KNEE ARTHROPLASTY Left 03/02/2017   poly liner/notes 03/02/2017  . RIGHT HEART CATH N/A 11/13/2016   Procedure: Right Heart Cath with Cardiomems;  Surgeon: Larey Dresser, MD;  Location: [place] [facility];  Service: Cardiovascular;  Laterality: N/A;  . SHOULDER ARTHROSCOPY WITH  ROTATOR CUFF REPAIR Right   . SHOULDER OPEN ROTATOR CUFF REPAIR Left 01/2011   [name] 01/29/2011  . TOTAL ABDOMINAL HYSTERECTOMY    . TOTAL KNEE ARTHROPLASTY Left   . TOTAL KNEE ARTHROPLASTY Right 11/18/2012   Procedure: TOTAL KNEE ARTHROPLASTY;  Surgeon: Yvette Rack., MD;  Location: [place];  Service: Orthopedics;  Laterality: Right;  . TUBAL LIGATION    . UPPER GASTROINTESTINAL ENDOSCOPY    . UPPER GI ENDOSCOPY      There were no vitals filed for this visit.   Subjective Assessment - 03/28/20 1015    Subjective  [name] presents to OT for Rx visit 20/36 to address BLE lymphedema. Pt reports she fell getting out of the shower last night and is sore this morning. Pt reports pain in legs , "is way down. It's about a 2/10." Port is removed. Pt reports IV antibiotic for RLE cellulitis is DC and she started oral antibiotics during visit interval .    Pertinent History chronic BLE leg swelling and associated pain ~ 40 years; hx herniated intervertebral discL TKA; L total knee revision; HTN, Obesity. dementia; restless leg; CHF; unstable anina; R RC arthroplasty, OA, cervical spondylosis with myelopathy, chronic lower back pain, CKD (Stage?); Fibromyalgia; Hypothyroidism; OSA ( cPap?); hx pneumonia; RA, Type II Diabetes. S/P cardiac cathg, B THA, Lumbar laminectomy, posterior lumbar fusion, B shoulder arthroplasty; total abdominal hysterectomy    Limitations chronic leg swelling and associated pain, difficulty walking, impaired dynamic balance, muscle weakness,    Repetition Increases Symptoms    Special Tests + STEMMER sign base of toes bilaterally    Pain Onset --   s/p 45 yrs              LYMPHEDEMA/ONCOLOGY QUESTIONNAIRE - 03/28/20 0001      Left Lower Extremity Lymphedema   Other LLE A-D= 8175.2 ml. LLE E-G= 6968.7 ml. LLE A-G = 15144.4 ml.     Other LLE leg  (A-D) volume reduction measures 1.81 %, thigh reduction measures 21.83%, and full limb volume reduction measures 12.19%     Other LLE leg volume (A-D= ankle-tibial tuberosity) is decreased by 14.3% since last measured on 03/05/20, and decreased by 15.8% overall since initially measured on 02/09/20. LLE thigh (E-G, or knee to groin) volume is decreased by 14.8% since last measured on 03/05/20, and by 33.4% overall since commencing OT for CDT. LLE full leg (A-G, or ankle to groin) volume is decreased by 14.5% since last measured, and by 25% overall since commencing OT for CDT. Goal met for both limb segments and full limb.  OT Treatments/Exercises (OP) - 03/28/20 0001      ADLs   ADL Education Given Yes      Manual Therapy   Manual Therapy Edema management    Compression Bandaging L thigh length multilayer gradient compression wraps as established.                   OT Education - 03/28/20 1341    Education Details Continued skilled Pt/caregiver education  And LE ADL training throughout visit for lymphedema self care/ home program, including compression wrapping, compression garment and device wear/care, lymphatic pumping ther ex, simple self-MLD, and skin care. Discussed progress towards goals.    Person(s) Educated Patient    Methods Explanation;Demonstration;Handout    Comprehension Verbalized understanding;Returned demonstration;Need further instruction               OT Long Term Goals - 03/28/20 1345      OT LONG TERM GOAL #1   Title Pt will be able to apply LLE multi-layer, short stretch compression wraps daily with maximum caregiver assistance, using correct gradient techniques independently to return affected limb/s, as closely as possible, to premorbid size and shape, to limit infection risk, and to improve safe functional mobility and ADLs performance.    Baseline dependent    Time 4    Period Days    Status Partially Met   Pt has no assistance at SNF for compression wrapping. Pt launders and rolls bandages independently. Pt still requires ongoing verbal cues to  lift leg and reposition wheil assistant waraps. 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     OT LONG TERM GOAL #2   Title Pt will be able to verbalize signs and symptoms of cellulitis infection and identify 4 common lymphedema precautions using printed resource for reference (modified independence) to limit LE progression over time.    Baseline Max A    Time 4    Period Days    Status Achieved      OT LONG TERM GOAL #3   Title Pt will sustain than 85% compliance with all daily LE self-care home program components throughout Intensive Phase CDT, including impeccable skin care, lymphatic pumping therex,  compression wraps and simple self MLD, to ensure optimal limb volume reduction, to limit infection risk and to limit LE progression.    Baseline dependent    Time 12    Period Weeks    Status Partially Met   Pt has achieved ~ 75% compliance with LE self care home program. Pt unableto reapply wraps between visits if she has to remove them     .     OT LONG TERM GOAL #4   Title Pt to achieve at least 10% LLE limb volume reduction during Intensive Phase CDT with Max CG assistance for wrapping, to improve functional performance of basic and instrumental ADLs, and to limit LE progression.    Baseline dependent    Time 12    Period Weeks    Status Partially Met   Met for all LLE segments and for full leg. Reduction goal for overall reduction of LLE limb volume to 20%     OT LONG TERM GOAL #5   Title During self-management phase of CDT Pt will retain limb volume reductions achieved during Intensive Phase CDT with no more than 3% volume increase using all LE self care components  to limit LE progression and further functional decline.    Baseline dependent    Time 6  Period Months    Status On-going                 Plan - 03/28/20 1342    Clinical Impression Statement LLE comparative limb volumetrics reveal Pt has met and exceeded initial 10% limb volume reduction goal. LLE leg volume  (A-D= ankle-tibial tuberosity) is decreased by 14.3% since last measured on 03/05/20, and decreased by 15.8% overall since initially measured on 02/09/20. LLE thigh (E-G, or knee to groin) volume is decreased by 14.8% since last measured on 03/05/20, and by 33.4% overall since commencing OT for CDT. LLE full leg (A-G, or ankle to groin) volume is decreased by 14.5% since last measured, and by 25% overall since commencing OT for CDT. Goal met for both limb segments and full limb.Pt demonstrates improved functional mobility today when she is able to comple SPT from wc to Rx bed with extra time , and she is able to lift both legs onto bed without using assistive device or requirng manual assistance. Pt agrees with plan to increase lle VOLUJME REDUCTION GOAL TO 20% OVERALL. See Long Term Goals section for  detailed progress towards goals. Cont as per POC.           Patient will benefit from skilled therapeutic intervention in order to improve the following deficits and impairments:           Visit Diagnosis: Lymphedema, not elsewhere classified    Problem List Patient Active Problem List   Diagnosis Date Noted  . Iron deficiency 08/06/2018  . History of total left knee replacement (TKR) 07/18/2018  . Rectal bleeding 07/05/2018  . Hemorrhoids 07/05/2018  . Anal itching 07/05/2018  . Rotator cuff arthropathy of right shoulder   . Shoulder arthritis 06/15/2017  . Status post revision of total knee replacement, left 04/29/2017  . Polyethylene liner wear following left total knee arthroplasty requiring isolated polyethylene liner exchange ([place]) 03/02/2017  . 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 Unstable angina ([place]) 05/21/2016  . Chronic diastolic heart failure ([place]) 03/29/2016  . Normal coronary arteries 2009 01/14/2015  . Obesity-BMI 40 01/14/2015  . Restless leg 01/14/2015  . Chest pain 01/14/2015  . Back pain 01/14/2015  . Renal insufficiency 01/14/2015  .  Dementia- mild memory issues 01/14/2015  . Occult blood positive stool 12/14/2014  . Anemia 12/14/2014  . Family history of colon cancer 12/14/2014  . Change in bowel habits 12/14/2014  . GERD (gastroesophageal reflux disease) 12/14/2014  . Dyspnea 05/17/2012  . Lymphedema 05/17/2012  . Weight gain 05/17/2012  . HTN (hypertension) 05/17/2012    Andrey Spearman, MS, OTR/L, [facility] 03/28/20 1:52 PM   [place] MAIN [facility] SERVICES [address] [place], [place], [address] Phone: [phone]   Fax:  [phone]  Name: KYLAN VEACH MRN: [id number] Date of Birth: 10-03-1945

## 2020-03-29 ENCOUNTER — Telehealth: Payer: Self-pay | Admitting: Orthopedic Surgery

## 2020-03-29 ENCOUNTER — Ambulatory Visit: Payer: Medicare Other | Admitting: Occupational Therapy

## 2020-03-29 DIAGNOSIS — I890 Lymphedema, not elsewhere classified: Secondary | ICD-10-CM | POA: Diagnosis not present

## 2020-03-29 NOTE — Therapy (Signed)
[place] MAIN [facility] SERVICES [address] [place], [place], [address] Phone: [phone]   Fax:  [phone]  Occupational Therapy Treatment  Patient Details  Name: [name] MRN: [id number] Date of Birth: 1945/12/26 Referring Provider (OT): Juluis Pitch, MD   Encounter Date: 03/29/2020   OT End of Session - 03/29/20 1227    Visit Number 21    Number of Visits 36    Date for OT Re-Evaluation 05/09/20    OT Start Time 1010    OT Stop Time 1115    OT Time Calculation (min) 65 min    Activity Tolerance Patient tolerated treatment well;No increased pain    Behavior During Therapy WFL for tasks assessed/performed           Past Medical History:  Diagnosis Date  . Anemia   . Anxiety   . Arthritis    "hands, feet" (03/03/2017)  . Brain lesion    2 types  . Cataract   . Cervical spondylosis with myelopathy   . Chest pain    Normal cardiac cath 5/09  . Chronic lower back pain   . CKD (chronic kidney disease), stage III    [name]  . Congestive heart failure (CHF) ([place])    has a Cardiomems implant   . Constipation   . Dementia ([place])   . Depression   . Dumping syndrome   . Dyspnea    with activity  . Edema   . Fatigue   . Fibromyalgia   . GERD (gastroesophageal reflux disease)   . Headache    "w/high CBG" (03/03/2017)  . History of echocardiogram    a. Echo 03/20/16 (done at [facility] in [place], [place]):  mild LVH, EF 88%, normal diastolic function, mild LAE, MAC, RVSP 25 mmHg  . Hyperlipidemia   . Hypertension   . Hypothyroidism   . Lumbar spondylosis   . Lymphedema    seeing specialist for this  . Migraine    "mostly stopped when I changed my diet" (03/03/2017)  . OSA on CPAP   . Pneumonia X 1  . PONV (postoperative nausea and vomiting)   . Restless leg syndrome   . Rheumatoid arthritis ([place])   . Stroke [facility])    TIAs "mini strokes"- unsure of last TIA - pt and daughter deny this  .  Syncope   . Tremors of nervous system   . Type II diabetes mellitus ([place])     Past Surgical History:  Procedure Laterality Date  . APPENDECTOMY  1966  . BACK SURGERY    . CARDIAC CATHETERIZATION N/A 05/25/2016   Procedure: Right/Left Heart Cath and Coronary Angiography;  Surgeon: Larey Dresser, MD;  Location: [place] [facility];  Service: Cardiovascular;  Laterality: N/A;  . CATARACT EXTRACTION W/ INTRAOCULAR LENS  IMPLANT, BILATERAL Bilateral   . COLONOSCOPY    . CORONARY ANGIOGRAM  2009   Normal coronaries  . EXCISION/RELEASE BURSA HIP Bilateral   . I & D KNEE WITH POLY EXCHANGE Left 03/02/2017   Procedure: Left knee Revision of poly liner;  Surgeon: Mcarthur Rossetti, MD;  Location: [place];  Service: Orthopedics;  Laterality: Left;  . JOINT REPLACEMENT    . KNEE ARTHROSCOPY Bilateral   . LAPAROSCOPIC CHOLECYSTECTOMY  2015  . LMF  2017   CardiMEMS HF implant for CHF (measures amount of fluid in the heart)  . LUMBAR DISC SURGERY    . LUMBAR LAMINECTOMY/DECOMPRESSION MICRODISCECTOMY Left  12/2005   L2-3 laminectomy and diskectomy/notes 01/06/2011  . POSTERIOR LUMBAR FUSION  04/2006   [name] 01/06/2011; "put cages in"  . REVERSE SHOULDER ARTHROPLASTY Left 06/15/2017   Procedure: REVERSE LEFT SHOULDER ARTHROPLASTY;  Surgeon: Meredith Pel, MD;  Location: [place];  Service: Orthopedics;  Laterality: Left;  . REVERSE SHOULDER ARTHROPLASTY Right 02/01/2018  . REVERSE SHOULDER ARTHROPLASTY Right 02/01/2018   Procedure: RIGHT REVERSE SHOULDER ARTHROPLASTY;  Surgeon: Meredith Pel, MD;  Location: [place];  Service: Orthopedics;  Laterality: Right;  . REVISION TOTAL KNEE ARTHROPLASTY Left 03/02/2017   poly liner/notes 03/02/2017  . RIGHT HEART CATH N/A 11/13/2016   Procedure: Right Heart Cath with Cardiomems;  Surgeon: Larey Dresser, MD;  Location: [place] [facility];  Service: Cardiovascular;  Laterality: N/A;  . SHOULDER ARTHROSCOPY WITH ROTATOR CUFF REPAIR Right   . SHOULDER  OPEN ROTATOR CUFF REPAIR Left 01/2011   [name] 01/29/2011  . TOTAL ABDOMINAL HYSTERECTOMY    . TOTAL KNEE ARTHROPLASTY Left   . TOTAL KNEE ARTHROPLASTY Right 11/18/2012   Procedure: TOTAL KNEE ARTHROPLASTY;  Surgeon: Yvette Rack., MD;  Location: [place];  Service: Orthopedics;  Laterality: Right;  . TUBAL LIGATION    . UPPER GASTROINTESTINAL ENDOSCOPY    . UPPER GI ENDOSCOPY      There were no vitals filed for this visit.   Subjective Assessment - 03/29/20 1226    Subjective  [name] presents to OT for Rx visit 21/36 to address BLE lymphedema. Pt has no new complaints this date.    Pertinent History chronic BLE leg swelling and associated pain ~ 40 years; hx herniated intervertebral discL TKA; L total knee revision; HTN, Obesity. dementia; restless leg; CHF; unstable anina; R RC arthroplasty, OA, cervical spondylosis with myelopathy, chronic lower back pain, CKD (Stage?); Fibromyalgia; Hypothyroidism; OSA ( cPap?); hx pneumonia; RA, Type II Diabetes. S/P cardiac cathg, B THA, Lumbar laminectomy, posterior lumbar fusion, B shoulder arthroplasty; total abdominal hysterectomy    Limitations chronic leg swelling and associated pain, difficulty walking, impaired dynamic balance, muscle weakness,    Repetition Increases Symptoms    Special Tests + STEMMER sign base of toes bilaterally    Pain Score 0-No pain    Pain Onset --   s/p 45 yrs                                    OT Long Term Goals - 03/28/20 1345      OT LONG TERM GOAL #1   Title Pt will be able to apply LLE multi-layer, short stretch compression wraps daily with maximum caregiver assistance, using correct gradient techniques independently to return affected limb/s, as closely as possible, to premorbid size and shape, to limit infection risk, and to improve safe functional mobility and ADLs performance.    Baseline dependent    Time 4    Period Days    Status Partially Met   Pt has no assistance at SNF  for compression wrapping. Pt launders and rolls bandages independently. Pt still requires ongoing verbal cues to lift leg and reposition wheil assistant waraps. 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     OT LONG TERM GOAL #2   Title Pt will be able to verbalize signs and symptoms of cellulitis infection and identify 4 common lymphedema precautions using printed resource for reference (modified independence) to limit LE progression over time.  Baseline Max A    Time 4    Period Days    Status Achieved      OT LONG TERM GOAL #3   Title Pt will sustain than 85% compliance with all daily LE self-care home program components throughout Intensive Phase CDT, including impeccable skin care, lymphatic pumping therex,  compression wraps and simple self MLD, to ensure optimal limb volume reduction, to limit infection risk and to limit LE progression.    Baseline dependent    Time 12    Period Weeks    Status Partially Met   Pt has achieved ~ 75% compliance with LE self care home program. Pt unableto reapply wraps between visits if she has to remove them     .     OT LONG TERM GOAL #4   Title Pt to achieve at least 10% LLE limb volume reduction during Intensive Phase CDT with Max CG assistance for wrapping, to improve functional performance of basic and instrumental ADLs, and to limit LE progression.    Baseline dependent    Time 12    Period Weeks    Status Partially Met   Met for all LLE segments and for full leg. Reduction goal for overall reduction of LLE limb volume to 20%     OT LONG TERM GOAL #5   Title During self-management phase of CDT Pt will retain limb volume reductions achieved during Intensive Phase CDT with no more than 3% volume increase using all LE self care components  to limit LE progression and further functional decline.    Baseline dependent    Time 6    Period Months    Status On-going                 Plan - 03/29/20 1228    Clinical Impression Statement Limb  volumetrics completed last session were compared with 2nd set of volumes completed in July. When comparing volumes with inital set of values measured in June when commencing CDT, limb volumes are significantly better: L Leg (A-D) reduction   overall measures 15%. L thigh overall reduction measures 33%, and LLE full leimb reduction measures 25% since commencing OT for CDT. Pt tolerated MLD, skin care and compression wrapping today without increased pain. Today is 2nd day Pt has been able to life both legs onto bed without using an assistive device, or with assistance.Cont as per POC.           Patient will benefit from skilled therapeutic intervention in order to improve the following deficits and impairments:           Visit Diagnosis: Lymphedema, not elsewhere classified    Problem List Patient Active Problem List   Diagnosis Date Noted  . Iron deficiency 08/06/2018  . History of total left knee replacement (TKR) 07/18/2018  . Rectal bleeding 07/05/2018  . Hemorrhoids 07/05/2018  . Anal itching 07/05/2018  . Rotator cuff arthropathy of right shoulder   . Shoulder arthritis 06/15/2017  . Status post revision of total knee replacement, left 04/29/2017  . Polyethylene liner wear following left total knee arthroplasty requiring isolated polyethylene liner exchange ([place]) 03/02/2017  . 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 Unstable angina ([place]) 05/21/2016  . Chronic diastolic heart failure ([place]) 03/29/2016  . Normal coronary arteries 2009 01/14/2015  . Obesity-BMI 40 01/14/2015  . Restless leg 01/14/2015  . Chest pain 01/14/2015  . Back pain 01/14/2015  . Renal insufficiency 01/14/2015  .  Dementia- mild memory issues 01/14/2015  . Occult blood positive stool 12/14/2014  . Anemia 12/14/2014  . Family history of colon cancer 12/14/2014  . Change in bowel habits 12/14/2014  . GERD (gastroesophageal reflux disease) 12/14/2014  . Dyspnea 05/17/2012  .  Lymphedema 05/17/2012  . Weight gain 05/17/2012  . HTN (hypertension) 05/17/2012    Andrey Spearman, MS, OTR/L, [facility] 03/29/20 12:33 PM   [place] MAIN [facility] SERVICES [address] [place], [place], [address] Phone: [phone]   Fax:  [phone]  Name: [name] MRN: [id number] Date of Birth: 1945-11-01

## 2020-03-29 NOTE — Telephone Encounter (Signed)
Elmyra Ricks with Dr.Stern called stating she needed the pts medical records in order to set an appt and would like to have this faxed over

## 2020-04-02 ENCOUNTER — Other Ambulatory Visit: Payer: Self-pay

## 2020-04-02 ENCOUNTER — Ambulatory Visit: Payer: Medicare Other | Admitting: Occupational Therapy

## 2020-04-02 DIAGNOSIS — I890 Lymphedema, not elsewhere classified: Secondary | ICD-10-CM | POA: Diagnosis not present

## 2020-04-02 NOTE — Therapy (Signed)
[place] MAIN [facility] SERVICES [address] [place], [place], [address] Phone: [phone]   Fax:  [phone]  Occupational Therapy Treatment  Patient Details  Name: [name] MRN: [id number] Date of Birth: September 02, 1945 Referring Provider (OT): Juluis Pitch, MD   Encounter Date: 04/02/2020   OT End of Session - 04/02/20 1351    Visit Number 22    Number of Visits 36    Date for OT Re-Evaluation 05/09/20    OT Start Time 1010    OT Stop Time 1105    OT Time Calculation (min) 55 min    Activity Tolerance Patient tolerated treatment well;No increased pain    Behavior During Therapy WFL for tasks assessed/performed           Past Medical History:  Diagnosis Date  . Anemia   . Anxiety   . Arthritis    "hands, feet" (03/03/2017)  . Brain lesion    2 types  . Cataract   . Cervical spondylosis with myelopathy   . Chest pain    Normal cardiac cath 5/09  . Chronic lower back pain   . CKD (chronic kidney disease), stage III    [name]  . Congestive heart failure (CHF) ([place])    has a Cardiomems implant   . Constipation   . Dementia ([place])   . Depression   . Dumping syndrome   . Dyspnea    with activity  . Edema   . Fatigue   . Fibromyalgia   . GERD (gastroesophageal reflux disease)   . Headache    "w/high CBG" (03/03/2017)  . History of echocardiogram    a. Echo 03/20/16 (done at [facility] in [place], [place]):  mild LVH, EF 53%, normal diastolic function, mild LAE, MAC, RVSP 25 mmHg  . Hyperlipidemia   . Hypertension   . Hypothyroidism   . Lumbar spondylosis   . Lymphedema    seeing specialist for this  . Migraine    "mostly stopped when I changed my diet" (03/03/2017)  . OSA on CPAP   . Pneumonia X 1  . PONV (postoperative nausea and vomiting)   . Restless leg syndrome   . Rheumatoid arthritis ([place])   . Stroke [facility])    TIAs "mini strokes"- unsure of last TIA - pt and daughter deny this  .  Syncope   . Tremors of nervous system   . Type II diabetes mellitus ([place])     Past Surgical History:  Procedure Laterality Date  . APPENDECTOMY  1966  . BACK SURGERY    . CARDIAC CATHETERIZATION N/A 05/25/2016   Procedure: Right/Left Heart Cath and Coronary Angiography;  Surgeon: Larey Dresser, MD;  Location: [place] [facility];  Service: Cardiovascular;  Laterality: N/A;  . CATARACT EXTRACTION W/ INTRAOCULAR LENS  IMPLANT, BILATERAL Bilateral   . COLONOSCOPY    . CORONARY ANGIOGRAM  2009   Normal coronaries  . EXCISION/RELEASE BURSA HIP Bilateral   . I & D KNEE WITH POLY EXCHANGE Left 03/02/2017   Procedure: Left knee Revision of poly liner;  Surgeon: Mcarthur Rossetti, MD;  Location: [place];  Service: Orthopedics;  Laterality: Left;  . JOINT REPLACEMENT    . KNEE ARTHROSCOPY Bilateral   . LAPAROSCOPIC CHOLECYSTECTOMY  2015  . LMF  2017   CardiMEMS HF implant for CHF (measures amount of fluid in the heart)  . LUMBAR DISC SURGERY    . LUMBAR LAMINECTOMY/DECOMPRESSION MICRODISCECTOMY Left  12/2005   L2-3 laminectomy and diskectomy/notes 01/06/2011  . POSTERIOR LUMBAR FUSION  04/2006   [name] 01/06/2011; "put cages in"  . REVERSE SHOULDER ARTHROPLASTY Left 06/15/2017   Procedure: REVERSE LEFT SHOULDER ARTHROPLASTY;  Surgeon: Meredith Pel, MD;  Location: [place];  Service: Orthopedics;  Laterality: Left;  . REVERSE SHOULDER ARTHROPLASTY Right 02/01/2018  . REVERSE SHOULDER ARTHROPLASTY Right 02/01/2018   Procedure: RIGHT REVERSE SHOULDER ARTHROPLASTY;  Surgeon: Meredith Pel, MD;  Location: [place];  Service: Orthopedics;  Laterality: Right;  . REVISION TOTAL KNEE ARTHROPLASTY Left 03/02/2017   poly liner/notes 03/02/2017  . RIGHT HEART CATH N/A 11/13/2016   Procedure: Right Heart Cath with Cardiomems;  Surgeon: Larey Dresser, MD;  Location: [place] [facility];  Service: Cardiovascular;  Laterality: N/A;  . SHOULDER ARTHROSCOPY WITH ROTATOR CUFF REPAIR Right   . SHOULDER  OPEN ROTATOR CUFF REPAIR Left 01/2011   [name] 01/29/2011  . TOTAL ABDOMINAL HYSTERECTOMY    . TOTAL KNEE ARTHROPLASTY Left   . TOTAL KNEE ARTHROPLASTY Right 11/18/2012   Procedure: TOTAL KNEE ARTHROPLASTY;  Surgeon: Yvette Rack., MD;  Location: [place];  Service: Orthopedics;  Laterality: Right;  . TUBAL LIGATION    . UPPER GASTROINTESTINAL ENDOSCOPY    . UPPER GI ENDOSCOPY      There were no vitals filed for this visit.   Subjective Assessment - 04/02/20 1346    Subjective  [name] presents to OT for Rx visit 22/36 to address BLE lymphedema. Pt has no new complaints this date.She denies LLE leg pain. "My (R) leg doesnt look red in bright ight."    Pertinent History chronic BLE leg swelling and associated pain ~ 40 years; hx herniated intervertebral discL TKA; L total knee revision; HTN, Obesity. dementia; restless leg; CHF; unstable anina; R RC arthroplasty, OA, cervical spondylosis with myelopathy, chronic lower back pain, CKD (Stage?); Fibromyalgia; Hypothyroidism; OSA ( cPap?); hx pneumonia; RA, Type II Diabetes. S/P cardiac cathg, B THA, Lumbar laminectomy, posterior lumbar fusion, B shoulder arthroplasty; total abdominal hysterectomy    Limitations chronic leg swelling and associated pain, difficulty walking, impaired dynamic balance, muscle weakness,    Repetition Increases Symptoms    Special Tests + STEMMER sign base of toes bilaterally    Currently in Pain? No/denies    Pain Onset --   s/p 44 yrs                       OT Treatments/Exercises (OP) - 04/02/20 0001      ADLs   ADL Education Given Yes      Manual Therapy   Manual Therapy Edema management    Manual Lymphatic Drainage (MLD)  LLE MLD to LLE/LLQ utilizing modified "short neck sequence" with no strokes to neck in keeping w' LE thyroid precautions. UTILIZED DEEP ABDOMINAL PATHWAYS VIS DIAPHRAGMATIC BREATHING, FUNCTIONAL INGUINAL ln, AND PROGIDED DYNAMIC STROKES TO LEG IN SEGMENTS FROM PROXIMAL TO DISTAL.  eXCELLENT TOLERANCE.    Compression Bandaging L knee length multilayer gradient compression wraps as established.                   OT Education - 04/02/20 1348    Education Details Pt edu re compression garments, circular knit vs flat knit custom vs alternative adjustable style. Disc and does not roll down.  Will look at samples next visitussed thigh vs knee high and recommendations for knee length flat knit garment to ensure garment remains in place.  Person(s) Educated Patient    Methods Explanation;Demonstration;Handout    Comprehension Verbalized understanding;Returned demonstration;Need further instruction               OT Long Term Goals - 03/28/20 1345      OT LONG TERM GOAL #1   Title Pt will be able to apply LLE multi-layer, short stretch compression wraps daily with maximum caregiver assistance, using correct gradient techniques independently to return affected limb/s, as closely as possible, to premorbid size and shape, to limit infection risk, and to improve safe functional mobility and ADLs performance.    Baseline dependent    Time 4    Period Days    Status Partially Met   Pt has no assistance at SNF for compression wrapping. Pt launders and rolls bandages independently. Pt still requires ongoing verbal cues to lift leg and reposition wheil assistant waraps. 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     OT LONG TERM GOAL #2   Title Pt will be able to verbalize signs and symptoms of cellulitis infection and identify 4 common lymphedema precautions using printed resource for reference (modified independence) to limit LE progression over time.    Baseline Max A    Time 4    Period Days    Status Achieved      OT LONG TERM GOAL #3   Title Pt will sustain than 85% compliance with all daily LE self-care home program components throughout Intensive Phase CDT, including impeccable skin care, lymphatic pumping therex,  compression wraps and simple self MLD, to  ensure optimal limb volume reduction, to limit infection risk and to limit LE progression.    Baseline dependent    Time 12    Period Weeks    Status Partially Met   Pt has achieved ~ 75% compliance with LE self care home program. Pt unableto reapply wraps between visits if she has to remove them     .     OT LONG TERM GOAL #4   Title Pt to achieve at least 10% LLE limb volume reduction during Intensive Phase CDT with Max CG assistance for wrapping, to improve functional performance of basic and instrumental ADLs, and to limit LE progression.    Baseline dependent    Time 12    Period Weeks    Status Partially Met   Met for all LLE segments and for full leg. Reduction goal for overall reduction of LLE limb volume to 20%     OT LONG TERM GOAL #5   Title During self-management phase of CDT Pt will retain limb volume reductions achieved during Intensive Phase CDT with no more than 3% volume increase using all LE self care components  to limit LE progression and further functional decline.    Baseline dependent    Time 6    Period Months    Status On-going                 Plan - 04/02/20 1351    Clinical Impression Statement Pt tolerated MLD, skin care and compression wraps to LLE today without difficulty.Marland Kitchen LLE limb volume reduction  appears to e approaching clinical plateau. Began teaching compression garment options, recommendations and DME process. Will show samples next visit. Consider knee length garments  to ensure garments remain in place.           Patient will benefit from skilled therapeutic intervention in order to improve the following deficits and impairments:  Visit Diagnosis: Lymphedema, not elsewhere classified    Problem List Patient Active Problem List   Diagnosis Date Noted  . Iron deficiency 08/06/2018  . History of total left knee replacement (TKR) 07/18/2018  . Rectal bleeding 07/05/2018  . Hemorrhoids 07/05/2018  . Anal itching  07/05/2018  . Rotator cuff arthropathy of right shoulder   . Shoulder arthritis 06/15/2017  . Status post revision of total knee replacement, left 04/29/2017  . Polyethylene liner wear following left total knee arthroplasty requiring isolated polyethylene liner exchange ([place]) 03/02/2017  . 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 Unstable angina ([place]) 05/21/2016  . Chronic diastolic heart failure ([place]) 03/29/2016  . Normal coronary arteries 2009 01/14/2015  . Obesity-BMI 40 01/14/2015  . Restless leg 01/14/2015  . Chest pain 01/14/2015  . Back pain 01/14/2015  . Renal insufficiency 01/14/2015  . Dementia- mild memory issues 01/14/2015  . Occult blood positive stool 12/14/2014  . Anemia 12/14/2014  . Family history of colon cancer 12/14/2014  . Change in bowel habits 12/14/2014  . GERD (gastroesophageal reflux disease) 12/14/2014  . Dyspnea 05/17/2012  . Lymphedema 05/17/2012  . Weight gain 05/17/2012  . HTN (hypertension) 05/17/2012    Andrey Spearman, MS, OTR/L, [facility] 04/02/20 1:54 PM  [place] MAIN [facility] SERVICES [address] [place], [place], [address] Phone: [phone]   Fax:  [phone]  Name: [name] MRN: [id number] Date of Birth: 07-27-1946

## 2020-04-04 ENCOUNTER — Other Ambulatory Visit: Payer: Self-pay

## 2020-04-04 ENCOUNTER — Ambulatory Visit: Payer: Medicare Other | Admitting: Occupational Therapy

## 2020-04-04 DIAGNOSIS — I890 Lymphedema, not elsewhere classified: Secondary | ICD-10-CM

## 2020-04-04 NOTE — Therapy (Signed)
[place] MAIN [facility] SERVICES [address] [place], [place], [address] Phone: [phone]   Fax:  [phone]  Occupational Therapy Treatment  Patient Details  Name: [name] MRN: [id number] Date of Birth: [birth year] Referring Provider (OT): Juluis Pitch, MD   Encounter Date: 04/04/2020   OT End of Session - 04/04/20 1254    Visit Number 23    Number of Visits 36    Date for OT Re-Evaluation 05/09/20    OT Start Time 0955    OT Stop Time 1105    OT Time Calculation (min) 70 min    Activity Tolerance Patient tolerated treatment well;No increased pain    Behavior During Therapy WFL for tasks assessed/performed           Past Medical History:  Diagnosis Date  . Anemia   . Anxiety   . Arthritis    "hands, feet" (03/03/2017)  . Brain lesion    2 types  . Cataract   . Cervical spondylosis with myelopathy   . Chest pain    Normal cardiac cath 5/09  . Chronic lower back pain   . CKD (chronic kidney disease), stage III    [name]  . Congestive heart failure (CHF) ([place])    has a Cardiomems implant   . Constipation   . Dementia ([place])   . Depression   . Dumping syndrome   . Dyspnea    with activity  . Edema   . Fatigue   . Fibromyalgia   . GERD (gastroesophageal reflux disease)   . Headache    "w/high CBG" (03/03/2017)  . History of echocardiogram    a. Echo 03/20/16 (done at [facility] in [place], [place]):  mild LVH, EF 71%, normal diastolic function, mild LAE, MAC, RVSP 25 mmHg  . Hyperlipidemia   . Hypertension   . Hypothyroidism   . Lumbar spondylosis   . Lymphedema    seeing specialist for this  . Migraine    "mostly stopped when I changed my diet" (03/03/2017)  . OSA on CPAP   . Pneumonia X 1  . PONV (postoperative nausea and vomiting)   . Restless leg syndrome   . Rheumatoid arthritis ([place])   . Stroke [facility])    TIAs "mini strokes"- unsure of last TIA - pt and daughter deny this  .  Syncope   . Tremors of nervous system   . Type II diabetes mellitus ([place])     Past Surgical History:  Procedure Laterality Date  . APPENDECTOMY  1966  . BACK SURGERY    . CARDIAC CATHETERIZATION N/A 05/25/2016   Procedure: Right/Left Heart Cath and Coronary Angiography;  Surgeon: Larey Dresser, MD;  Location: [place] [facility];  Service: Cardiovascular;  Laterality: N/A;  . CATARACT EXTRACTION W/ INTRAOCULAR LENS  IMPLANT, BILATERAL Bilateral   . COLONOSCOPY    . CORONARY ANGIOGRAM  2009   Normal coronaries  . EXCISION/RELEASE BURSA HIP Bilateral   . I & D KNEE WITH POLY EXCHANGE Left 03/02/2017   Procedure: Left knee Revision of poly liner;  Surgeon: Mcarthur Rossetti, MD;  Location: [place];  Service: Orthopedics;  Laterality: Left;  . JOINT REPLACEMENT    . KNEE ARTHROSCOPY Bilateral   . LAPAROSCOPIC CHOLECYSTECTOMY  2015  . LMF  2017   CardiMEMS HF implant for CHF (measures amount of fluid in the heart)  . LUMBAR DISC SURGERY    . LUMBAR LAMINECTOMY/DECOMPRESSION MICRODISCECTOMY Left  12/2005   L2-3 laminectomy and diskectomy/notes 01/06/2011  . POSTERIOR LUMBAR FUSION  04/2006   [name] 01/06/2011; "put cages in"  . REVERSE SHOULDER ARTHROPLASTY Left 06/15/2017   Procedure: REVERSE LEFT SHOULDER ARTHROPLASTY;  Surgeon: Meredith Pel, MD;  Location: [place];  Service: Orthopedics;  Laterality: Left;  . REVERSE SHOULDER ARTHROPLASTY Right 02/01/2018  . REVERSE SHOULDER ARTHROPLASTY Right 02/01/2018   Procedure: RIGHT REVERSE SHOULDER ARTHROPLASTY;  Surgeon: Meredith Pel, MD;  Location: [place];  Service: Orthopedics;  Laterality: Right;  . REVISION TOTAL KNEE ARTHROPLASTY Left 03/02/2017   poly liner/notes 03/02/2017  . RIGHT HEART CATH N/A 11/13/2016   Procedure: Right Heart Cath with Cardiomems;  Surgeon: Larey Dresser, MD;  Location: [place] [facility];  Service: Cardiovascular;  Laterality: N/A;  . SHOULDER ARTHROSCOPY WITH ROTATOR CUFF REPAIR Right   . SHOULDER  OPEN ROTATOR CUFF REPAIR Left 01/2011   [name] 01/29/2011  . TOTAL ABDOMINAL HYSTERECTOMY    . TOTAL KNEE ARTHROPLASTY Left   . TOTAL KNEE ARTHROPLASTY Right 11/18/2012   Procedure: TOTAL KNEE ARTHROPLASTY;  Surgeon: Yvette Rack., MD;  Location: [place];  Service: Orthopedics;  Laterality: Right;  . TUBAL LIGATION    . UPPER GASTROINTESTINAL ENDOSCOPY    . UPPER GI ENDOSCOPY      There were no vitals filed for this visit.   Subjective Assessment - 04/04/20 0948    Subjective  [name] presents to OT for Rx visit 23/36 to address BLE lymphedema. Pt has no new complaints this date.She denies LLE leg pain. "My (R) leg doesnt look red in bright ight."    Pertinent History chronic BLE leg swelling and associated pain ~ 40 years; hx herniated intervertebral discL TKA; L total knee revision; HTN, Obesity. dementia; restless leg; CHF; unstable anina; R RC arthroplasty, OA, cervical spondylosis with myelopathy, chronic lower back pain, CKD (Stage?); Fibromyalgia; Hypothyroidism; OSA ( cPap?); hx pneumonia; RA, Type II Diabetes. S/P cardiac cathg, B THA, Lumbar laminectomy, posterior lumbar fusion, B shoulder arthroplasty; total abdominal hysterectomy    Limitations chronic leg swelling and associated pain, difficulty walking, impaired dynamic balance, muscle weakness,    Repetition Increases Symptoms    Special Tests + STEMMER sign base of toes bilaterally    Pain Onset --   s/p 45 yrs                       OT Treatments/Exercises (OP) - 04/04/20 0001      ADLs   ADL Education Given Yes      Manual Therapy   Manual Therapy Edema management    Edema Management completed anatomical measurements for LLE custom compression stockings and HOS device needed to limit fatty fibrosis formation during HOS    Compression Bandaging L knee length multilayer gradient compression wraps as established.                   OT Education - 04/04/20 1001    Education Details Pt edu re  compression garment and device options and OT recommendations    Person(s) Educated Patient    Methods Explanation;Demonstration;Handout    Comprehension Verbalized understanding;Returned demonstration;Need further instruction               OT Long Term Goals - 03/28/20 1345      OT LONG TERM GOAL #1   Title Pt will be able to apply LLE multi-layer, short stretch compression wraps daily with maximum caregiver assistance,  using correct gradient techniques independently to return affected limb/s, as closely as possible, to premorbid size and shape, to limit infection risk, and to improve safe functional mobility and ADLs performance.    Baseline dependent    Time 4    Period Days    Status Partially Met   Pt has no assistance at SNF for compression wrapping. Pt launders and rolls bandages independently. Pt still requires ongoing verbal cues to lift leg and reposition wheil assistant waraps. 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     OT LONG TERM GOAL #2   Title Pt will be able to verbalize signs and symptoms of cellulitis infection and identify 4 common lymphedema precautions using printed resource for reference (modified independence) to limit LE progression over time.    Baseline Max A    Time 4    Period Days    Status Achieved      OT LONG TERM GOAL #3   Title Pt will sustain than 85% compliance with all daily LE self-care home program components throughout Intensive Phase CDT, including impeccable skin care, lymphatic pumping therex,  compression wraps and simple self MLD, to ensure optimal limb volume reduction, to limit infection risk and to limit LE progression.    Baseline dependent    Time 12    Period Weeks    Status Partially Met   Pt has achieved ~ 75% compliance with LE self care home program. Pt unableto reapply wraps between visits if she has to remove them     .     OT LONG TERM GOAL #4   Title Pt to achieve at least 10% LLE limb volume reduction during Intensive  Phase CDT with Max CG assistance for wrapping, to improve functional performance of basic and instrumental ADLs, and to limit LE progression.    Baseline dependent    Time 12    Period Weeks    Status Partially Met   Met for all LLE segments and for full leg. Reduction goal for overall reduction of LLE limb volume to 20%     OT LONG TERM GOAL #5   Title During self-management phase of CDT Pt will retain limb volume reductions achieved during Intensive Phase CDT with no more than 3% volume increase using all LE self care components  to limit LE progression and further functional decline.    Baseline dependent    Time 6    Period Months    Status On-going                 Plan - 04/04/20 1255    Clinical Impression Statement Provided Pt education regarding garment options and OT recommendations based on both clinical and practical issues. Pt in agreement with plan to start with Elvarex knee length elastic compression stockings initially. She understands that these will require Max Assist for donning and doffing on a daily basis. Pt lives at assisted living facility so help is available. Completed measurements for LLE for Elvarex classic, ccl 2 (25-35 mmHg) , flat knit, knee high , and Jobst RELAX A-D, ccl 2 , for HOS.Cont as per POC.           Patient will benefit from skilled therapeutic intervention in order to improve the following deficits and impairments:           Visit Diagnosis: Lymphedema, not elsewhere classified    Problem List Patient Active Problem List   Diagnosis Date Noted  . Iron deficiency 08/06/2018  .  History of total left knee replacement (TKR) 07/18/2018  . Rectal bleeding 07/05/2018  . Hemorrhoids 07/05/2018  . Anal itching 07/05/2018  . Rotator cuff arthropathy of right shoulder   . Shoulder arthritis 06/15/2017  . Status post revision of total knee replacement, left 04/29/2017  . Polyethylene liner wear following left total knee arthroplasty  requiring isolated polyethylene liner exchange ([place]) 03/02/2017  . 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 Unstable angina ([place]) 05/21/2016  . Chronic diastolic heart failure ([place]) 03/29/2016  . Normal coronary arteries 2009 01/14/2015  . Obesity-BMI 40 01/14/2015  . Restless leg 01/14/2015  . Chest pain 01/14/2015  . Back pain 01/14/2015  . Renal insufficiency 01/14/2015  . Dementia- mild memory issues 01/14/2015  . Occult blood positive stool 12/14/2014  . Anemia 12/14/2014  . Family history of colon cancer 12/14/2014  . Change in bowel habits 12/14/2014  . GERD (gastroesophageal reflux disease) 12/14/2014  . Dyspnea 05/17/2012  . Lymphedema 05/17/2012  . Weight gain 05/17/2012  . HTN (hypertension) 05/17/2012    Andrey Spearman, MS, OTR/L, [facility] 04/04/20 1:06 PM   [place] MAIN [facility] SERVICES [address] [place], [place], [address] Phone: [phone]   Fax:  [phone]  Name: [name] MRN: [id number] Date of Birth: 05-06-1946

## 2020-04-05 ENCOUNTER — Ambulatory Visit: Payer: Medicare Other | Admitting: Occupational Therapy

## 2020-04-05 ENCOUNTER — Other Ambulatory Visit: Payer: Self-pay

## 2020-04-05 DIAGNOSIS — I890 Lymphedema, not elsewhere classified: Secondary | ICD-10-CM

## 2020-04-05 NOTE — Therapy (Signed)
[place] MAIN [facility] SERVICES [address] [place], [place], [address] Phone: [phone]   Fax:  [phone]  Occupational Therapy Treatment  Patient Details  Name: [name] MRN: [id number] Date of Birth: 16-Dec-1945 Referring Provider (OT): Juluis Pitch, MD   Encounter Date: 04/05/2020   OT End of Session - 04/05/20 1015    Visit Number 24    Number of Visits 36    Date for OT Re-Evaluation 05/09/20    OT Start Time 1015    OT Stop Time 1116    OT Time Calculation (min) 61 min    Activity Tolerance Patient tolerated treatment well;No increased pain    Behavior During Therapy WFL for tasks assessed/performed           Past Medical History:  Diagnosis Date  . Anemia   . Anxiety   . Arthritis    "hands, feet" (03/03/2017)  . Brain lesion    2 types  . Cataract   . Cervical spondylosis with myelopathy   . Chest pain    Normal cardiac cath 5/09  . Chronic lower back pain   . CKD (chronic kidney disease), stage III    [name]  . Congestive heart failure (CHF) ([place])    has a Cardiomems implant   . Constipation   . Dementia ([place])   . Depression   . Dumping syndrome   . Dyspnea    with activity  . Edema   . Fatigue   . Fibromyalgia   . GERD (gastroesophageal reflux disease)   . Headache    "w/high CBG" (03/03/2017)  . History of echocardiogram    a. Echo 03/20/16 (done at [facility] in [place], [place]):  mild LVH, EF 00%, normal diastolic function, mild LAE, MAC, RVSP 25 mmHg  . Hyperlipidemia   . Hypertension   . Hypothyroidism   . Lumbar spondylosis   . Lymphedema    seeing specialist for this  . Migraine    "mostly stopped when I changed my diet" (03/03/2017)  . OSA on CPAP   . Pneumonia X 1  . PONV (postoperative nausea and vomiting)   . Restless leg syndrome   . Rheumatoid arthritis ([place])   . Stroke [facility])    TIAs "mini strokes"- unsure of last TIA - [name] and daughter deny this  .  Syncope   . Tremors of nervous system   . Type II diabetes mellitus ([place])     Past Surgical History:  Procedure Laterality Date  . APPENDECTOMY  1966  . BACK SURGERY    . CARDIAC CATHETERIZATION N/A 05/25/2016   Procedure: Right/Left Heart Cath and Coronary Angiography;  Surgeon: Larey Dresser, MD;  Location: [place] [facility];  Service: Cardiovascular;  Laterality: N/A;  . CATARACT EXTRACTION W/ INTRAOCULAR LENS  IMPLANT, BILATERAL Bilateral   . COLONOSCOPY    . CORONARY ANGIOGRAM  2009   Normal coronaries  . EXCISION/RELEASE BURSA HIP Bilateral   . I & D KNEE WITH POLY EXCHANGE Left 03/02/2017   Procedure: Left knee Revision of poly liner;  Surgeon: Mcarthur Rossetti, MD;  Location: [place];  Service: Orthopedics;  Laterality: Left;  . JOINT REPLACEMENT    . KNEE ARTHROSCOPY Bilateral   . LAPAROSCOPIC CHOLECYSTECTOMY  2015  . LMF  2017   CardiMEMS HF implant for CHF (measures amount of fluid in the heart)  . LUMBAR DISC SURGERY    . LUMBAR LAMINECTOMY/DECOMPRESSION MICRODISCECTOMY Left  12/2005   L2-3 laminectomy and diskectomy/notes 01/06/2011  . POSTERIOR LUMBAR FUSION  04/2006   [name] 01/06/2011; "put cages in"  . REVERSE SHOULDER ARTHROPLASTY Left 06/15/2017   Procedure: REVERSE LEFT SHOULDER ARTHROPLASTY;  Surgeon: Meredith Pel, MD;  Location: [place];  Service: Orthopedics;  Laterality: Left;  . REVERSE SHOULDER ARTHROPLASTY Right 02/01/2018  . REVERSE SHOULDER ARTHROPLASTY Right 02/01/2018   Procedure: RIGHT REVERSE SHOULDER ARTHROPLASTY;  Surgeon: Meredith Pel, MD;  Location: [place];  Service: Orthopedics;  Laterality: Right;  . REVISION TOTAL KNEE ARTHROPLASTY Left 03/02/2017   poly liner/notes 03/02/2017  . RIGHT HEART CATH N/A 11/13/2016   Procedure: Right Heart Cath with Cardiomems;  Surgeon: Larey Dresser, MD;  Location: [place] [facility];  Service: Cardiovascular;  Laterality: N/A;  . SHOULDER ARTHROSCOPY WITH ROTATOR CUFF REPAIR Right   . SHOULDER  OPEN ROTATOR CUFF REPAIR Left 01/2011   [name] 01/29/2011  . TOTAL ABDOMINAL HYSTERECTOMY    . TOTAL KNEE ARTHROPLASTY Left   . TOTAL KNEE ARTHROPLASTY Right 11/18/2012   Procedure: TOTAL KNEE ARTHROPLASTY;  Surgeon: Yvette Rack., MD;  Location: [place];  Service: Orthopedics;  Laterality: Right;  . TUBAL LIGATION    . UPPER GASTROINTESTINAL ENDOSCOPY    . UPPER GI ENDOSCOPY      There were no vitals filed for this visit.   Subjective Assessment - 04/05/20 1016    Subjective  [name] presents to OT for Rx visit 25/36 to address BLE lymphedema. [name] complains of pain in bilateral thighs (5/10) and lower back (6-7/10).    Pertinent History chronic BLE leg swelling and associated pain ~ 40 years; hx herniated intervertebral discL TKA; L total knee revision; HTN, Obesity. dementia; restless leg; CHF; unstable anina; R RC arthroplasty, OA, cervical spondylosis with myelopathy, chronic lower back pain, CKD (Stage?); Fibromyalgia; Hypothyroidism; OSA ( cPap?); hx pneumonia; RA, Type II Diabetes. S/P cardiac cathg, B THA, Lumbar laminectomy, posterior lumbar fusion, B shoulder arthroplasty; total abdominal hysterectomy    Limitations chronic leg swelling and associated pain, difficulty walking, impaired dynamic balance, muscle weakness,    Repetition Increases Symptoms    Special Tests + STEMMER sign base of toes bilaterally    Pain Onset --   s/p 45 yrs                       OT Treatments/Exercises (OP) - 04/05/20 0001      ADLs   ADL Education Given Yes (P)       Manual Therapy   Manual Therapy Edema management;Manual Lymphatic Drainage (MLD);Compression Bandaging (P)     Manual Lymphatic Drainage (MLD)  LLE MLD to LLE/LLQ utilizing modified "short neck sequence" with no strokes to neck in keeping w' LE thyroid precautions. UTILIZED DEEP ABDOMINAL PATHWAYS VIS DIAPHRAGMATIC BREATHING, FUNCTIONAL INGUINAL ln, AND PROGIDED DYNAMIC STROKES TO LEG IN SEGMENTS FROM PROXIMAL TO DISTAL.  eXCELLENT TOLERANCE. (P)     Compression Bandaging L knee length multilayer gradient compression wraps as established.  (P)                   OT Education - 04/05/20 1204    Education Details [name] edu re compression garment and device options and OT recommendations    Person(s) Educated Patient    Methods Explanation;Demonstration;Handout    Comprehension Verbalized understanding;Returned demonstration;Need further instruction               OT Long Term Goals -  03/28/20 1345      OT LONG TERM GOAL #1   Title [name] will be able to apply LLE multi-layer, short stretch compression wraps daily with maximum caregiver assistance, using correct gradient techniques independently to return affected limb/s, as closely as possible, to premorbid size and shape, to limit infection risk, and to improve safe functional mobility and ADLs performance.    Baseline dependent    Time 4    Period Days    Status Partially Met   [name] has no assistance at SNF for compression wrapping. [name] launders and rolls bandages independently. [name] still requires ongoing verbal cues to lift leg and reposition wheil assistant waraps. [name] .     OT LONG TERM GOAL #2   Title [name] will be able to verbalize signs and symptoms of cellulitis infection and identify 4 common lymphedema precautions using printed resource for reference (modified independence) to limit LE progression over time.    Baseline Max A    Time 4    Period Days    Status Achieved      OT LONG TERM GOAL #3   Title [name] will sustain than 85% compliance with all daily LE self-care home program components throughout Intensive Phase CDT, including impeccable skin care, lymphatic pumping therex,  compression wraps and simple self MLD, to ensure optimal limb volume reduction, to limit infection risk and to limit LE progression.    Baseline dependent    Time 12    Period Weeks    Status Partially Met   [name] has achieved ~ 75%  compliance with LE self care home program. [name] unableto reapply wraps between visits if [name] has to remove them     .     OT LONG TERM GOAL #4   Title [name] to achieve at least 10% LLE limb volume reduction during Intensive Phase CDT with Max CG assistance for wrapping, to improve functional performance of basic and instrumental ADLs, and to limit LE progression.    Baseline dependent    Time 12    Period Weeks    Status Partially Met   Met for all LLE segments and for full leg. Reduction goal for overall reduction of LLE limb volume to 20%     OT LONG TERM GOAL #5   Title During self-management phase of CDT [name] will retain limb volume reductions achieved during Intensive Phase CDT with no more than 3% volume increase using all LE self care components  to limit LE progression and further functional decline.    Baseline dependent    Time 6    Period Months    Status On-going                 Plan - 04/05/20 1205    Clinical Impression Statement [name] tolerated MLD, skin care and compression wraps to LLE today without difficulty today. Wrapped to knee only. Cont as per POC.           Patient will benefit from skilled therapeutic intervention in order to improve the following deficits and impairments:           Visit Diagnosis: Lymphedema, not elsewhere classified    Problem List Patient Active Problem List   Diagnosis Date Noted  . Iron deficiency 08/06/2018  . History of total left knee replacement (TKR) 07/18/2018  . Rectal bleeding 07/05/2018  . Hemorrhoids 07/05/2018  . Anal itching 07/05/2018  . Rotator cuff arthropathy of right shoulder   .  Shoulder arthritis 06/15/2017  . Status post revision of total knee replacement, left 04/29/2017  . Polyethylene liner wear following left total knee arthroplasty requiring isolated polyethylene liner exchange ([place]) 03/02/2017  . 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 Unstable angina ([place]) 05/21/2016    . Chronic diastolic heart failure ([place]) 03/29/2016  . Normal coronary arteries 2009 01/14/2015  . Obesity-BMI 40 01/14/2015  . Restless leg 01/14/2015  . Chest pain 01/14/2015  . Back pain 01/14/2015  . Renal insufficiency 01/14/2015  . Dementia- mild memory issues 01/14/2015  . Occult blood positive stool 12/14/2014  . Anemia 12/14/2014  . Family history of colon cancer 12/14/2014  . Change in bowel habits 12/14/2014  . GERD (gastroesophageal reflux disease) 12/14/2014  . Dyspnea 05/17/2012  . Lymphedema 05/17/2012  . Weight gain 05/17/2012  . HTN (hypertension) 05/17/2012    Andrey Spearman, MS, OTR/L, [facility] 04/05/20 12:10 PM   [place] MAIN [facility] SERVICES [address] [place], [place], [address] Phone: [phone]   Fax:  [phone]  Name: STASSI FADELY MRN: [id number] Date of Birth: 29-Nov-1945

## 2020-04-09 ENCOUNTER — Ambulatory Visit: Payer: Medicare Other | Admitting: Occupational Therapy

## 2020-04-09 ENCOUNTER — Other Ambulatory Visit: Payer: Self-pay

## 2020-04-09 DIAGNOSIS — I890 Lymphedema, not elsewhere classified: Secondary | ICD-10-CM

## 2020-04-09 NOTE — Therapy (Signed)
[place] MAIN [facility] SERVICES [address] [place], [place], [address] Phone: [phone]   Fax:  [phone]  Occupational Therapy Treatment  Patient Details  Name: [name] MRN: [id number] Date of Birth: 25-Jun-1946 Referring Provider (OT): Juluis Pitch, MD   Encounter Date: 04/09/2020   OT End of Session - 04/09/20 1221    Visit Number 25    Number of Visits 36    Date for OT Re-Evaluation 05/09/20    OT Start Time 1003    OT Stop Time 1115    OT Time Calculation (min) 72 min    Activity Tolerance Patient tolerated treatment well;No increased pain    Behavior During Therapy WFL for tasks assessed/performed           Past Medical History:  Diagnosis Date   Anemia    Anxiety    Arthritis    "hands, feet" (03/03/2017)   Brain lesion    2 types   Cataract    Cervical spondylosis with myelopathy    Chest pain    Normal cardiac cath 5/09   Chronic lower back pain    CKD (chronic kidney disease), stage III    [name]   Congestive heart failure (CHF) ([facility])    has a Cardiomems implant    Constipation    Dementia ([place])    Depression    Dumping syndrome    Dyspnea    with activity   Edema    Fatigue    Fibromyalgia    GERD (gastroesophageal reflux disease)    Headache    "w/high CBG" (03/03/2017)   History of echocardiogram    a. Echo 03/20/16 (done at [facility] in [place], [place]):  mild LVH, EF 63%, normal diastolic function, mild LAE, MAC, RVSP 25 mmHg   Hyperlipidemia    Hypertension    Hypothyroidism    Lumbar spondylosis    Lymphedema    seeing specialist for this   Migraine    "mostly stopped when I changed my diet" (03/03/2017)   OSA on CPAP    Pneumonia X 1   PONV (postoperative nausea and vomiting)    Restless leg syndrome    Rheumatoid arthritis ([place])    Stroke ([place])    TIAs "mini strokes"- unsure of last TIA - pt and daughter deny this    Syncope    Tremors of nervous system    Type II diabetes mellitus ([place])     Past Surgical History:  Procedure Laterality Date   APPENDECTOMY  1966   BACK SURGERY     CARDIAC CATHETERIZATION N/A 05/25/2016   Procedure: Right/Left Heart Cath and Coronary Angiography;  Surgeon: Larey Dresser, MD;  Location: [place] [facility];  Service: Cardiovascular;  Laterality: N/A;   CATARACT EXTRACTION W/ INTRAOCULAR LENS  IMPLANT, BILATERAL Bilateral    COLONOSCOPY     CORONARY ANGIOGRAM  2009   Normal coronaries   EXCISION/RELEASE BURSA HIP Bilateral    I & D KNEE WITH POLY EXCHANGE Left 03/02/2017   Procedure: Left knee Revision of poly liner;  Surgeon: Mcarthur Rossetti, MD;  Location: [place];  Service: Orthopedics;  Laterality: Left;   JOINT REPLACEMENT     KNEE ARTHROSCOPY Bilateral    LAPAROSCOPIC CHOLECYSTECTOMY  2015   LMF  2017   CardiMEMS HF implant for CHF (measures amount of fluid in the heart)   LUMBAR DISC SURGERY     LUMBAR LAMINECTOMY/DECOMPRESSION MICRODISCECTOMY Left  12/2005   L2-3 laminectomy and diskectomy/notes 01/06/2011   POSTERIOR LUMBAR FUSION  04/2006   [name] 01/06/2011; "put cages in"   REVERSE SHOULDER ARTHROPLASTY Left 06/15/2017   Procedure: REVERSE LEFT SHOULDER ARTHROPLASTY;  Surgeon: Meredith Pel, MD;  Location: [place];  Service: Orthopedics;  Laterality: Left;   REVERSE SHOULDER ARTHROPLASTY Right 02/01/2018   REVERSE SHOULDER ARTHROPLASTY Right 02/01/2018   Procedure: RIGHT REVERSE SHOULDER ARTHROPLASTY;  Surgeon: Meredith Pel, MD;  Location: [place];  Service: Orthopedics;  Laterality: Right;   REVISION TOTAL KNEE ARTHROPLASTY Left 03/02/2017   poly liner/notes 03/02/2017   RIGHT HEART CATH N/A 11/13/2016   Procedure: Right Heart Cath with Cardiomems;  Surgeon: Larey Dresser, MD;  Location: [place] [facility];  Service: Cardiovascular;  Laterality: N/A;   SHOULDER ARTHROSCOPY WITH ROTATOR CUFF REPAIR Right    SHOULDER  OPEN ROTATOR CUFF REPAIR Left 01/2011   [name] 01/29/2011   TOTAL ABDOMINAL HYSTERECTOMY     TOTAL KNEE ARTHROPLASTY Left    TOTAL KNEE ARTHROPLASTY Right 11/18/2012   Procedure: TOTAL KNEE ARTHROPLASTY;  Surgeon: Yvette Rack., MD;  Location: [place];  Service: Orthopedics;  Laterality: Right;   TUBAL LIGATION     UPPER GASTROINTESTINAL ENDOSCOPY     UPPER GI ENDOSCOPY      There were no vitals filed for this visit.   Subjective Assessment - 04/09/20 1014    Subjective  [name] presents to OT for Rx visit 25/36 to address BLE lymphedema. Pt complains of LE related pain 5/10. Pt reports she had to remove compression wraps the day after her last session and has not been wrapped since then. "It's like I was never wrapped. My legs are pretty swollen." Pt reports she has started PT and has had 2 sessions thus far.    Pertinent History chronic BLE leg swelling and associated pain ~ 40 years; hx herniated intervertebral discL TKA; L total knee revision; HTN, Obesity. dementia; restless leg; CHF; unstable anina; R RC arthroplasty, OA, cervical spondylosis with myelopathy, chronic lower back pain, CKD (Stage?); Fibromyalgia; Hypothyroidism; OSA ( cPap?); hx pneumonia; RA, Type II Diabetes. S/P cardiac cathg, B THA, Lumbar laminectomy, posterior lumbar fusion, B shoulder arthroplasty; total abdominal hysterectomy    Limitations chronic leg swelling and associated pain, difficulty walking, impaired dynamic balance, muscle weakness,    Repetition Increases Symptoms    Special Tests + STEMMER sign base of toes bilaterally    Pain Onset --   s/p 45 yrs                       OT Treatments/Exercises (OP) - 04/09/20 0001      ADLs   ADL Education Given Yes (P)       Manual Therapy   Manual Therapy Edema management;Compression Bandaging;Manual Lymphatic Drainage (MLD) (P)     Manual Lymphatic Drainage (MLD)  LLE MLD to LLE/LLQ utilizing modified "short neck sequence" with no strokes  to neck in keeping w' LE thyroid precautions. UTILIZED DEEP ABDOMINAL PATHWAYS VIS DIAPHRAGMATIC BREATHING, FUNCTIONAL INGUINAL ln, AND PROGIDED DYNAMIC STROKES TO LEG IN SEGMENTS FROM PROXIMAL TO DISTAL. eXCELLENT TOLERANCE. (P)     Compression Bandaging L knee length multilayer gradient compression wraps as established.  (P)                   OT Education - 04/09/20 1221    Education Details Continued skilled Pt/caregiver education  And LE ADL training throughout  visit for lymphedema self care/ home program, including compression wrapping, compression garment and device wear/care, lymphatic pumping ther ex, simple self-MLD, and skin care. Discussed progress towards goals.    Person(s) Educated Patient    Methods Explanation;Demonstration;Handout    Comprehension Verbalized understanding;Returned demonstration;Need further instruction               OT Long Term Goals - 03/28/20 1345      OT LONG TERM GOAL #1   Title Pt will be able to apply LLE multi-layer, short stretch compression wraps daily with maximum caregiver assistance, using correct gradient techniques independently to return affected limb/s, as closely as possible, to premorbid size and shape, to limit infection risk, and to improve safe functional mobility and ADLs performance.    Baseline dependent    Time 4    Period Days    Status Partially Met   Pt has no assistance at SNF for compression wrapping. Pt launders and rolls bandages independently. Pt still requires ongoing verbal cues to lift leg and reposition wheil assistant waraps. 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     OT LONG TERM GOAL #2   Title Pt will be able to verbalize signs and symptoms of cellulitis infection and identify 4 common lymphedema precautions using printed resource for reference (modified independence) to limit LE progression over time.    Baseline Max A    Time 4    Period Days    Status Achieved      OT LONG TERM GOAL #3   Title  Pt will sustain than 85% compliance with all daily LE self-care home program components throughout Intensive Phase CDT, including impeccable skin care, lymphatic pumping therex,  compression wraps and simple self MLD, to ensure optimal limb volume reduction, to limit infection risk and to limit LE progression.    Baseline dependent    Time 12    Period Weeks    Status Partially Met   Pt has achieved ~ 75% compliance with LE self care home program. Pt unableto reapply wraps between visits if she has to remove them     .     OT LONG TERM GOAL #4   Title Pt to achieve at least 10% LLE limb volume reduction during Intensive Phase CDT with Max CG assistance for wrapping, to improve functional performance of basic and instrumental ADLs, and to limit LE progression.    Baseline dependent    Time 12    Period Weeks    Status Partially Met   Met for all LLE segments and for full leg. Reduction goal for overall reduction of LLE limb volume to 20%     OT LONG TERM GOAL #5   Title During self-management phase of CDT Pt will retain limb volume reductions achieved during Intensive Phase CDT with no more than 3% volume increase using all LE self care components  to limit LE progression and further functional decline.    Baseline dependent    Time 6    Period Months    Status On-going                 Plan - 04/09/20 1222    Clinical Impression Statement RLE substantialy more swollen and dense with lymphatic congestion after 3 days without compression over weekend Rx interval. Pt attempted to wrap leg by report , but was unsuccessful. Pt tolerated LLE MLD, skin care and compression wraps  without increased pain. C9nt as per POC. Pt will benefit  from visit w/ DPM for nail trim to limit infection risk.           Patient will benefit from skilled therapeutic intervention in order to improve the following deficits and impairments:           Visit Diagnosis: Lymphedema, not elsewhere  classified    Problem List Patient Active Problem List   Diagnosis Date Noted   Iron deficiency 08/06/2018   History of total left knee replacement (TKR) 07/18/2018   Rectal bleeding 07/05/2018   Hemorrhoids 07/05/2018   Anal itching 07/05/2018   Rotator cuff arthropathy of right shoulder    Shoulder arthritis 06/15/2017   Status post revision of total knee replacement, left 04/29/2017   Polyethylene liner wear following left total knee arthroplasty requiring isolated polyethylene liner exchange ([place]) 03/02/2017   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[place]) 03/02/2017   Unstable angina ([place]) 05/21/2016   Chronic diastolic heart failure ([place]) 03/29/2016   Normal coronary arteries 2009 01/14/2015   Obesity-BMI 40 01/14/2015   Restless leg 01/14/2015   Chest pain 01/14/2015   Back pain 01/14/2015   Renal insufficiency 01/14/2015   Dementia- mild memory issues 01/14/2015   Occult blood positive stool 12/14/2014   Anemia 12/14/2014   Family history of colon cancer 12/14/2014   Change in bowel habits 12/14/2014   GERD (gastroesophageal reflux disease) 12/14/2014   Dyspnea 05/17/2012   Lymphedema 05/17/2012   Weight gain 05/17/2012   HTN (hypertension) 05/17/2012   Andrey Spearman, MS, OTR/L, [facility] 04/09/20 12:26 PM   [place] MAIN [facility] SERVICES [address] [place], [place], [address] Phone: [phone]   Fax:  [phone]  Name: [name] MRN: [id number] Date of Birth: [birth year]

## 2020-04-11 ENCOUNTER — Other Ambulatory Visit: Payer: Self-pay

## 2020-04-11 ENCOUNTER — Ambulatory Visit: Payer: Medicare Other | Admitting: Occupational Therapy

## 2020-04-11 DIAGNOSIS — I890 Lymphedema, not elsewhere classified: Secondary | ICD-10-CM | POA: Diagnosis not present

## 2020-04-11 NOTE — Therapy (Signed)
[place] MAIN [facility] SERVICES [address] [place], [place], [address] Phone: [phone]   Fax:  [phone]  Occupational Therapy Treatment  Patient Details  Name: [name] MRN: [id number] Date of Birth: May 27, 1946 Referring Provider (OT): [name] Pitch, MD   Encounter Date: 04/11/2020   OT End of Session - 04/11/20 1125    Visit Number 26    Number of Visits 36    Date for OT Re-Evaluation 05/09/20    OT Start Time 1004    OT Stop Time 1105    OT Time Calculation (min) 61 min    Activity Tolerance Patient tolerated treatment well;No increased pain    Behavior During Therapy WFL for tasks assessed/performed           Past Medical History:  Diagnosis Date  . Anemia   . Anxiety   . Arthritis    "hands, feet" (03/03/2017)  . Brain lesion    2 types  . Cataract   . Cervical spondylosis with myelopathy   . Chest pain    Normal cardiac cath 5/09  . Chronic lower back pain   . CKD (chronic kidney disease), stage III    [name]  . Congestive heart failure (CHF) ([place])    has a Cardiomems implant   . Constipation   . Dementia ([place])   . Depression   . Dumping syndrome   . Dyspnea    with activity  . Edema   . Fatigue   . Fibromyalgia   . GERD (gastroesophageal reflux disease)   . Headache    "w/high CBG" (03/03/2017)  . History of echocardiogram    a. Echo 03/20/16 (done at [facility] in [place], [place]):  mild LVH, EF 65%, normal diastolic function, mild LAE, MAC, RVSP 25 mmHg  . Hyperlipidemia   . Hypertension   . Hypothyroidism   . Lumbar spondylosis   . Lymphedema    seeing specialist for this  . Migraine    "mostly stopped when I changed my diet" (03/03/2017)  . OSA on CPAP   . Pneumonia X 1  . PONV (postoperative nausea and vomiting)   . Restless leg syndrome   . Rheumatoid arthritis ([place])   . Stroke [facility])    TIAs "mini strokes"- unsure of last TIA - pt and daughter deny this  .  Syncope   . Tremors of nervous system   . Type II diabetes mellitus ([place])     Past Surgical History:  Procedure Laterality Date  . APPENDECTOMY  1966  . BACK SURGERY    . CARDIAC CATHETERIZATION N/A 05/25/2016   Procedure: Right/Left Heart Cath and Coronary Angiography;  Surgeon: [name] Dresser, MD;  Location: [place] [facility];  Service: Cardiovascular;  Laterality: N/A;  . CATARACT EXTRACTION W/ INTRAOCULAR LENS  IMPLANT, BILATERAL Bilateral   . COLONOSCOPY    . CORONARY ANGIOGRAM  2009   Normal coronaries  . EXCISION/RELEASE BURSA HIP Bilateral   . I & D KNEE WITH POLY EXCHANGE Left 03/02/2017   Procedure: Left knee Revision of poly liner;  Surgeon: [name] Rossetti, MD;  Location: [place];  Service: Orthopedics;  Laterality: Left;  . JOINT REPLACEMENT    . KNEE ARTHROSCOPY Bilateral   . LAPAROSCOPIC CHOLECYSTECTOMY  2015  . LMF  2017   CardiMEMS HF implant for CHF (measures amount of fluid in the heart)  . LUMBAR DISC SURGERY    . LUMBAR LAMINECTOMY/DECOMPRESSION MICRODISCECTOMY Left  12/2005   L2-3 laminectomy and diskectomy/notes 01/06/2011  . POSTERIOR LUMBAR FUSION  04/2006   [name] 01/06/2011; "put cages in"  . REVERSE SHOULDER ARTHROPLASTY Left 06/15/2017   Procedure: REVERSE LEFT SHOULDER ARTHROPLASTY;  Surgeon: [name] Pel, MD;  Location: [place];  Service: Orthopedics;  Laterality: Left;  . REVERSE SHOULDER ARTHROPLASTY Right 02/01/2018  . REVERSE SHOULDER ARTHROPLASTY Right 02/01/2018   Procedure: RIGHT REVERSE SHOULDER ARTHROPLASTY;  Surgeon: [name] Pel, MD;  Location: [place];  Service: Orthopedics;  Laterality: Right;  . REVISION TOTAL KNEE ARTHROPLASTY Left 03/02/2017   poly liner/notes 03/02/2017  . RIGHT HEART CATH N/A 11/13/2016   Procedure: Right Heart Cath with Cardiomems;  Surgeon: [name] Dresser, MD;  Location: [place] [facility];  Service: Cardiovascular;  Laterality: N/A;  . SHOULDER ARTHROSCOPY WITH ROTATOR CUFF REPAIR Right   . SHOULDER  OPEN ROTATOR CUFF REPAIR Left 01/2011   [name] 01/29/2011  . TOTAL ABDOMINAL HYSTERECTOMY    . TOTAL KNEE ARTHROPLASTY Left   . TOTAL KNEE ARTHROPLASTY Right 11/18/2012   Procedure: TOTAL KNEE ARTHROPLASTY;  Surgeon: [name]., MD;  Location: [place];  Service: Orthopedics;  Laterality: Right;  . TUBAL LIGATION    . UPPER GASTROINTESTINAL ENDOSCOPY    . UPPER GI ENDOSCOPY      There were no vitals filed for this visit.   Subjective Assessment - 04/11/20 1021    Subjective  [name] presents to OT for Rx visit 26/36 to address BLE lymphedema. Pt complains of LE related pain 5/10. Pt reports increased foot swelling over night. RLE is significantly more swollen today. Pt reports increased urination al day yesterday and overnight.    Pertinent History chronic BLE leg swelling and associated pain ~ 40 years; hx herniated intervertebral discL TKA; L total knee revision; HTN, Obesity. dementia; restless leg; CHF; unstable anina; R RC arthroplasty, OA, cervical spondylosis with myelopathy, chronic lower back pain, CKD (Stage?); Fibromyalgia; Hypothyroidism; OSA ( cPap?); hx pneumonia; RA, Type II Diabetes. S/P cardiac cathg, B THA, Lumbar laminectomy, posterior lumbar fusion, B shoulder arthroplasty; total abdominal hysterectomy    Limitations chronic leg swelling and associated pain, difficulty walking, impaired dynamic balance, muscle weakness,    Repetition Increases Symptoms    Special Tests + STEMMER sign base of toes bilaterally    Pain Onset --   s/p 45 yrs                       OT Treatments/Exercises (OP) - 04/11/20 0001      ADLs   ADL Education Given Yes      Manual Therapy   Manual Therapy Edema management;Manual Lymphatic Drainage (MLD);Compression Bandaging    Manual Lymphatic Drainage (MLD)  LLE MLD to LLE/LLQ utilizing modified "short neck sequence" with no strokes to neck in keeping w' LE thyroid precautions. UTILIZED DEEP ABDOMINAL PATHWAYS VIS DIAPHRAGMATIC  BREATHING, FUNCTIONAL INGUINAL ln, AND PROGIDED DYNAMIC STROKES TO LEG IN SEGMENTS FROM PROXIMAL TO DISTAL. eXCELLENT TOLERANCE.    Compression Bandaging L knee length multilayer gradient compression wraps as established.                   OT Education - 04/11/20 1124    Education Details Cont Pt edu re [place] care. Pt instructed to monitor RLE closely for recurrence of cellulitis due to sudden dramatic increase in unilateral swelling.    Person(s) Educated Patient    Methods Explanation;Demonstration;Handout    Comprehension Verbalized understanding;Returned  demonstration;Need further instruction               OT Long Term Goals - 03/28/20 1345      OT LONG TERM GOAL #1   Title Pt will be able to apply LLE multi-layer, short stretch compression wraps daily with maximum caregiver assistance, using correct gradient techniques independently to return affected limb/s, as closely as possible, to premorbid size and shape, to limit infection risk, and to improve safe functional mobility and ADLs performance.    Baseline dependent    Time 4    Period Days    Status Partially Met   Pt has no assistance at SNF for compression wrapping. Pt launders and rolls bandages independently. Pt still requires ongoing verbal cues to lift leg and reposition wheil assistant waraps. 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     OT LONG TERM GOAL #2   Title Pt will be able to verbalize signs and symptoms of cellulitis infection and identify 4 common lymphedema precautions using printed resource for reference (modified independence) to limit LE progression over time.    Baseline Max A    Time 4    Period Days    Status Achieved      OT LONG TERM GOAL #3   Title Pt will sustain than 85% compliance with all daily LE self-care home program components throughout Intensive Phase CDT, including impeccable skin care, lymphatic pumping therex,  compression wraps and simple self MLD, to ensure optimal limb  volume reduction, to limit infection risk and to limit LE progression.    Baseline dependent    Time 12    Period Weeks    Status Partially Met   Pt has achieved ~ 75% compliance with LE self care home program. Pt unableto reapply wraps between visits if she has to remove them     .     OT LONG TERM GOAL #4   Title Pt to achieve at least 10% LLE limb volume reduction during Intensive Phase CDT with Max CG assistance for wrapping, to improve functional performance of basic and instrumental ADLs, and to limit LE progression.    Baseline dependent    Time 12    Period Weeks    Status Partially Met   Met for all LLE segments and for full leg. Reduction goal for overall reduction of LLE limb volume to 20%     OT LONG TERM GOAL #5   Title During self-management phase of CDT Pt will retain limb volume reductions achieved during Intensive Phase CDT with no more than 3% volume increase using all LE self care components  to limit LE progression and further functional decline.    Baseline dependent    Time 6    Period Months    Status On-going                 Plan - 04/11/20 1140    Clinical Impression Statement RLE dramatically more swollen today than last visit. LLE well managed witout increased swelling, so problem is unilateral. Noted this RLE symptom and need for careful monitoring due to increased risk of recent cellulitis recurrence on SNF note. Pt had no difficulty tolerating LLE MLD , skin care and compression wrapping to knee. Cont as per POC.           Patient will benefit from skilled therapeutic intervention in order to improve the following deficits and impairments:           Visit  Diagnosis: Lymphedema, not elsewhere classified    Problem List Patient Active Problem List   Diagnosis Date Noted  . Iron deficiency 08/06/2018  . History of total left knee replacement (TKR) 07/18/2018  . Rectal bleeding 07/05/2018  . Hemorrhoids 07/05/2018  . Anal itching  07/05/2018  . Rotator cuff arthropathy of right shoulder   . Shoulder arthritis 06/15/2017  . Status post revision of total knee replacement, left 04/29/2017  . Polyethylene liner wear following left total knee arthroplasty requiring isolated polyethylene liner exchange ([place]) 03/02/2017  . 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 Unstable angina ([place]) 05/21/2016  . Chronic diastolic heart failure ([place]) 03/29/2016  . Normal coronary arteries 2009 01/14/2015  . Obesity-BMI 40 01/14/2015  . Restless leg 01/14/2015  . Chest pain 01/14/2015  . Back pain 01/14/2015  . Renal insufficiency 01/14/2015  . Dementia- mild memory issues 01/14/2015  . Occult blood positive stool 12/14/2014  . Anemia 12/14/2014  . Family history of colon cancer 12/14/2014  . Change in bowel habits 12/14/2014  . GERD (gastroesophageal reflux disease) 12/14/2014  . Dyspnea 05/17/2012  . Lymphedema 05/17/2012  . Weight gain 05/17/2012  . HTN (hypertension) 05/17/2012    Andrey Spearman, MS, OTR/L, [facility] 04/11/20 11:49 AM  [place] MAIN [facility] SERVICES [address] [place], [place], [address] Phone: [phone]   Fax:  [phone]  Name: [name] MRN: [id number] Date of Birth: [birth year]

## 2020-04-12 ENCOUNTER — Other Ambulatory Visit: Payer: Self-pay

## 2020-04-12 ENCOUNTER — Ambulatory Visit: Payer: Medicare Other | Admitting: Occupational Therapy

## 2020-04-12 DIAGNOSIS — I890 Lymphedema, not elsewhere classified: Secondary | ICD-10-CM

## 2020-04-12 NOTE — Therapy (Signed)
[place] MAIN [facility] SERVICES [address] [place], [place], [address] Phone: [phone]   Fax:  [phone]  Occupational Therapy Treatment  Patient Details  Name: [name] MRN: [id number] Date of Birth: [birth year] Referring Provider (OT): Juluis Pitch, MD   Encounter Date: 04/12/2020   OT End of Session - 04/12/20 1024    Visit Number 27    Number of Visits 36    Date for OT Re-Evaluation 05/09/20    OT Start Time 1012    OT Stop Time 1100    OT Time Calculation (min) 48 min    Activity Tolerance Patient tolerated treatment well;No increased pain    Behavior During Therapy WFL for tasks assessed/performed           Past Medical History:  Diagnosis Date  . Anemia   . Anxiety   . Arthritis    "hands, feet" (03/03/2017)  . Brain lesion    2 types  . Cataract   . Cervical spondylosis with myelopathy   . Chest pain    Normal cardiac cath 5/09  . Chronic lower back pain   . CKD (chronic kidney disease), stage III    [name]  . Congestive heart failure (CHF) ([place])    has a Cardiomems implant   . Constipation   . Dementia ([place])   . Depression   . Dumping syndrome   . Dyspnea    with activity  . Edema   . Fatigue   . Fibromyalgia   . GERD (gastroesophageal reflux disease)   . Headache    "w/high CBG" (03/03/2017)  . History of echocardiogram    a. Echo 03/20/16 (done at [facility] in [place], [place]):  mild LVH, EF 94%, normal diastolic function, mild LAE, MAC, RVSP 25 mmHg  . Hyperlipidemia   . Hypertension   . Hypothyroidism   . Lumbar spondylosis   . Lymphedema    seeing specialist for this  . Migraine    "mostly stopped when I changed my diet" (03/03/2017)  . OSA on CPAP   . Pneumonia X 1  . PONV (postoperative nausea and vomiting)   . Restless leg syndrome   . Rheumatoid arthritis ([place])   . Stroke [facility])    TIAs "mini strokes"- unsure of last TIA - pt and daughter deny this  .  Syncope   . Tremors of nervous system   . Type II diabetes mellitus ([place])     Past Surgical History:  Procedure Laterality Date  . APPENDECTOMY  1966  . BACK SURGERY    . CARDIAC CATHETERIZATION N/A 05/25/2016   Procedure: Right/Left Heart Cath and Coronary Angiography;  Surgeon: Larey Dresser, MD;  Location: [place] [facility];  Service: Cardiovascular;  Laterality: N/A;  . CATARACT EXTRACTION W/ INTRAOCULAR LENS  IMPLANT, BILATERAL Bilateral   . COLONOSCOPY    . CORONARY ANGIOGRAM  2009   Normal coronaries  . EXCISION/RELEASE BURSA HIP Bilateral   . I & D KNEE WITH POLY EXCHANGE Left 03/02/2017   Procedure: Left knee Revision of poly liner;  Surgeon: Mcarth[place] Rossetti, MD;  Location: [place];  Service: Orthopedics;  Laterality: Left;  . JOINT REPLACEMENT    . KNEE ARTHROSCOPY Bilateral   . LAPAROSCOPIC CHOLECYSTECTOMY  2015  . LMF  2017   CardiMEMS HF implant for CHF (measures amount of fluid in the heart)  . LUMBAR DISC SURGERY    . LUMBAR LAMINECTOMY/DECOMPRESSION MICRODISCECTOMY Left  12/2005   L2-3 laminectomy and diskectomy/notes 01/06/2011  . POSTERIOR LUMBAR FUSION  04/2006   [name] 01/06/2011; "put cages in"  . REVERSE SHOULDER ARTHROPLASTY Left 06/15/2017   Procedure: REVERSE LEFT SHOULDER ARTHROPLASTY;  Surgeon: Meredith Pel, MD;  Location: [place];  Service: Orthopedics;  Laterality: Left;  . REVERSE SHOULDER ARTHROPLASTY Right 02/01/2018  . REVERSE SHOULDER ARTHROPLASTY Right 02/01/2018   Procedure: RIGHT REVERSE SHOULDER ARTHROPLASTY;  Surgeon: Meredith Pel, MD;  Location: [place];  Service: Orthopedics;  Laterality: Right;  . REVISION TOTAL KNEE ARTHROPLASTY Left 03/02/2017   poly liner/notes 03/02/2017  . RIGHT HEART CATH N/A 11/13/2016   Procedure: Right Heart Cath with Cardiomems;  Surgeon: Larey Dresser, MD;  Location: [place] [facility];  Service: Cardiovascular;  Laterality: N/A;  . SHOULDER ARTHROSCOPY WITH ROTATOR CUFF REPAIR Right   . SHOULDER  OPEN ROTATOR CUFF REPAIR Left 01/2011   [name] 01/29/2011  . TOTAL ABDOMINAL HYSTERECTOMY    . TOTAL KNEE ARTHROPLASTY Left   . TOTAL KNEE ARTHROPLASTY Right 11/18/2012   Procedure: TOTAL KNEE ARTHROPLASTY;  Surgeon: Yvette Rack., MD;  Location: [place];  Service: Orthopedics;  Laterality: Right;  . TUBAL LIGATION    . UPPER GASTROINTESTINAL ENDOSCOPY    . UPPER GI ENDOSCOPY      There were no vitals filed for this visit.   Subjective Assessment - 04/12/20 1020    Subjective  [name] presents to OT for Rx visit 27/36 to address BLE lymphedema. Pt arrives 10 minutes late for 60 min appointment due to transportation. LLE pain 4/10, described as tightness.    Pertinent History chronic BLE leg swelling and associated pain ~ 40 years; hx herniated intervertebral discL TKA; L total knee revision; HTN, Obesity. dementia; restless leg; CHF; unstable anina; R RC arthroplasty, OA, cervical spondylosis with myelopathy, chronic lower back pain, CKD (Stage?); Fibromyalgia; Hypothyroidism; OSA ( cPap?); hx pneumonia; RA, Type II Diabetes. S/P cardiac cathg, B THA, Lumbar laminectomy, posterior lumbar fusion, B shoulder arthroplasty; total abdominal hysterectomy    Limitations chronic leg swelling and associated pain, difficulty walking, impaired dynamic balance, muscle weakness,    Repetition Increases Symptoms    Special Tests + STEMMER sign base of toes bilaterally    Pain Onset --   s/p 45 yrs                       OT Treatments/Exercises (OP) - 04/12/20 0001      ADLs   ADL Education Given Yes      Manual Therapy   Manual Therapy Edema management;Manual Lymphatic Drainage (MLD);Compression Bandaging    Manual Lymphatic Drainage (MLD)  LLE MLD to LLE/LLQ utilizing modified "short neck sequence" with no strokes to neck in keeping w' LE thyroid precautions. UTILIZED DEEP ABDOMINAL PATHWAYS VIS DIAPHRAGMATIC BREATHING, FUNCTIONAL INGUINAL ln, AND PROGIDED DYNAMIC STROKES TO LEG IN  SEGMENTS FROM PROXIMAL TO DISTAL. eXCELLENT TOLERANCE.    Compression Bandaging L knee length multilayer gradient compression wraps as established.                   OT Education - 04/12/20 1023    Education Details Cont Pt edu re [place] care. Pt instructed to monitor RLE closely for recurrence of cellulitis due to sudden dramatic increase in unilateral swelling.    Person(s) Educated Patient    Methods Explanation;Demonstration;Handout    Comprehension Verbalized understanding;Returned demonstration;Need further instruction  OT Long Term Goals - 03/28/20 1345      OT LONG TERM GOAL #1   Title Pt will be able to apply LLE multi-layer, short stretch compression wraps daily with maximum caregiver assistance, using correct gradient techniques independently to return affected limb/s, as closely as possible, to premorbid size and shape, to limit infection risk, and to improve safe functional mobility and ADLs performance.    Baseline dependent    Time 4    Period Days    Status Partially Met   Pt has no assistance at SNF for compression wrapping. Pt launders and rolls bandages independently. Pt still requires ongoing verbal cues to lift leg and reposition wheil assistant waraps. 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     OT LONG TERM GOAL #2   Title Pt will be able to verbalize signs and symptoms of cellulitis infection and identify 4 common lymphedema precautions using printed resource for reference (modified independence) to limit LE progression over time.    Baseline Max A    Time 4    Period Days    Status Achieved      OT LONG TERM GOAL #3   Title Pt will sustain than 85% compliance with all daily LE self-care home program components throughout Intensive Phase CDT, including impeccable skin care, lymphatic pumping therex,  compression wraps and simple self MLD, to ensure optimal limb volume reduction, to limit infection risk and to limit LE progression.     Baseline dependent    Time 12    Period Weeks    Status Partially Met   Pt has achieved ~ 75% compliance with LE self care home program. Pt unableto reapply wraps between visits if she has to remove them     .     OT LONG TERM GOAL #4   Title Pt to achieve at least 10% LLE limb volume reduction during Intensive Phase CDT with Max CG assistance for wrapping, to improve functional performance of basic and instrumental ADLs, and to limit LE progression.    Baseline dependent    Time 12    Period Weeks    Status Partially Met   Met for all LLE segments and for full leg. Reduction goal for overall reduction of LLE limb volume to 20%     OT LONG TERM GOAL #5   Title During self-management phase of CDT Pt will retain limb volume reductions achieved during Intensive Phase CDT with no more than 3% volume increase using all LE self care components  to limit LE progression and further functional decline.    Baseline dependent    Time 6    Period Months    Status On-going                 Plan - 04/12/20 1212    Clinical Impression Statement RLE slightly less swollen today compared with last visit. Redness persists. Skin temp is warm, but not excessively hot. Conbt to closely monitor for cellulitis recurrence. Pt tolerated LLE MLD, skin care and knee length compression wraps without difficulty. Pt cont to demonstrate progress towwards all OT goals for CDT to LLE. Cont as per POC. Fit LLE compression garment ASAP and shift CDT Intensive to RLE if no infection is present.           Patient will benefit from skilled therapeutic intervention in order to improve the following deficits and impairments:           Visit Diagnosis: Lymphedema,  not elsewhere classified    Problem List Patient Active Problem List   Diagnosis Date Noted  . Iron deficiency 08/06/2018  . History of total left knee replacement (TKR) 07/18/2018  . Rectal bleeding 07/05/2018  . Hemorrhoids 07/05/2018  . Anal  itching 07/05/2018  . Rotator cuff arthropathy of right shoulder   . Shoulder arthritis 06/15/2017  . Status post revision of total knee replacement, left 04/29/2017  . Polyethylene liner wear following left total knee arthroplasty requiring isolated polyethylene liner exchange ([place]) 03/02/2017  . 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 Unstable angina ([place]) 05/21/2016  . Chronic diastolic heart failure ([place]) 03/29/2016  . Normal coronary arteries 2009 01/14/2015  . Obesity-BMI 40 01/14/2015  . Restless leg 01/14/2015  . Chest pain 01/14/2015  . Back pain 01/14/2015  . Renal insufficiency 01/14/2015  . Dementia- mild memory issues 01/14/2015  . Occult blood positive stool 12/14/2014  . Anemia 12/14/2014  . Family history of colon cancer 12/14/2014  . Change in bowel habits 12/14/2014  . GERD (gastroesophageal reflux disease) 12/14/2014  . Dyspnea 05/17/2012  . Lymphedema 05/17/2012  . Weight gain 05/17/2012  . HTN (hypertension) 05/17/2012   Andrey Spearman, MS, OTR/L, [facility] 04/12/20 12:16 PM   [place] MAIN [facility] SERVICES [address] [place], [place], [address] Phone: [phone]   Fax:  [phone]  Name: NERIA PROCTER MRN: [id number] Date of Birth: September 09, 1945

## 2020-04-16 ENCOUNTER — Ambulatory Visit: Payer: Medicare Other | Admitting: Occupational Therapy

## 2020-04-16 ENCOUNTER — Other Ambulatory Visit: Payer: Self-pay

## 2020-04-16 DIAGNOSIS — I890 Lymphedema, not elsewhere classified: Secondary | ICD-10-CM

## 2020-04-16 NOTE — Therapy (Signed)
[place] MAIN [facility] SERVICES [address] [place], [place], [address] Phone: [phone]   Fax:  [phone]  Occupational Therapy Treatment  Patient Details  Name: [name] MRN: [id number] Date of Birth: [birth year] Referring Provider (OT): Juluis Pitch, MD   Encounter Date: 04/16/2020   OT End of Session - 04/16/20 1341    Visit Number 28    Number of Visits 36    Date for OT Re-Evaluation 05/09/20    OT Start Time 1000    OT Stop Time 1115    OT Time Calculation (min) 75 min    Activity Tolerance Patient tolerated treatment well;No increased pain    Behavior During Therapy WFL for tasks assessed/performed           Past Medical History:  Diagnosis Date  . Anemia   . Anxiety   . Arthritis    "hands, feet" (03/03/2017)  . Brain lesion    2 types  . Cataract   . Cervical spondylosis with myelopathy   . Chest pain    Normal cardiac cath 5/09  . Chronic lower back pain   . CKD (chronic kidney disease), stage III    [name]  . Congestive heart failure (CHF) ([place])    has a Cardiomems implant   . Constipation   . Dementia ([place])   . Depression   . Dumping syndrome   . Dyspnea    with activity  . Edema   . Fatigue   . Fibromyalgia   . GERD (gastroesophageal reflux disease)   . Headache    "w/high CBG" (03/03/2017)  . History of echocardiogram    a. Echo 03/20/16 (done at [facility] in [place], [place]):  mild LVH, EF 28%, normal diastolic function, mild LAE, MAC, RVSP 25 mmHg  . Hyperlipidemia   . Hypertension   . Hypothyroidism   . Lumbar spondylosis   . Lymphedema    seeing specialist for this  . Migraine    "mostly stopped when I changed my diet" (03/03/2017)  . OSA on CPAP   . Pneumonia X 1  . PONV (postoperative nausea and vomiting)   . Restless leg syndrome   . Rheumatoid arthritis ([place])   . Stroke [facility])    TIAs "mini strokes"- unsure of last TIA - pt and daughter deny this  .  Syncope   . Tremors of nervous system   . Type II diabetes mellitus ([place])     Past Surgical History:  Procedure Laterality Date  . APPENDECTOMY  1966  . BACK SURGERY    . CARDIAC CATHETERIZATION N/A 05/25/2016   Procedure: Right/Left Heart Cath and Coronary Angiography;  Surgeon: Larey Dresser, MD;  Location: [place] [facility];  Service: Cardiovascular;  Laterality: N/A;  . CATARACT EXTRACTION W/ INTRAOCULAR LENS  IMPLANT, BILATERAL Bilateral   . COLONOSCOPY    . CORONARY ANGIOGRAM  2009   Normal coronaries  . EXCISION/RELEASE BURSA HIP Bilateral   . I & D KNEE WITH POLY EXCHANGE Left 03/02/2017   Procedure: Left knee Revision of poly liner;  Surgeon: Mcarthur Rossetti, MD;  Location: [place];  Service: Orthopedics;  Laterality: Left;  . JOINT REPLACEMENT    . KNEE ARTHROSCOPY Bilateral   . LAPAROSCOPIC CHOLECYSTECTOMY  2015  . LMF  2017   CardiMEMS HF implant for CHF (measures amount of fluid in the heart)  . LUMBAR DISC SURGERY    . LUMBAR LAMINECTOMY/DECOMPRESSION MICRODISCECTOMY Left  12/2005   L2-3 laminectomy and diskectomy/notes 01/06/2011  . POSTERIOR LUMBAR FUSION  04/2006   [name] 01/06/2011; "put cages in"  . REVERSE SHOULDER ARTHROPLASTY Left 06/15/2017   Procedure: REVERSE LEFT SHOULDER ARTHROPLASTY;  Surgeon: Meredith Pel, MD;  Location: [place];  Service: Orthopedics;  Laterality: Left;  . REVERSE SHOULDER ARTHROPLASTY Right 02/01/2018  . REVERSE SHOULDER ARTHROPLASTY Right 02/01/2018   Procedure: RIGHT REVERSE SHOULDER ARTHROPLASTY;  Surgeon: Meredith Pel, MD;  Location: [place];  Service: Orthopedics;  Laterality: Right;  . REVISION TOTAL KNEE ARTHROPLASTY Left 03/02/2017   poly liner/notes 03/02/2017  . RIGHT HEART CATH N/A 11/13/2016   Procedure: Right Heart Cath with Cardiomems;  Surgeon: Larey Dresser, MD;  Location: [place] [facility];  Service: Cardiovascular;  Laterality: N/A;  . SHOULDER ARTHROSCOPY WITH ROTATOR CUFF REPAIR Right   . SHOULDER  OPEN ROTATOR CUFF REPAIR Left 01/2011   [name] 01/29/2011  . TOTAL ABDOMINAL HYSTERECTOMY    . TOTAL KNEE ARTHROPLASTY Left   . TOTAL KNEE ARTHROPLASTY Right 11/18/2012   Procedure: TOTAL KNEE ARTHROPLASTY;  Surgeon: Yvette Rack., MD;  Location: [place];  Service: Orthopedics;  Laterality: Right;  . TUBAL LIGATION    . UPPER GASTROINTESTINAL ENDOSCOPY    . UPPER GI ENDOSCOPY      There were no vitals filed for this visit.   Subjective Assessment - 04/16/20 1022    Subjective  [name] presents to OT for Rx visit 28/36 to address BLE lymphedema. Pt c/o pain in ankles and calves 4-5/10 this morning. Pt reports LLE is more swollen today because wraps were removed in prep for OT visit that was not scheduled. PTS given permission to observe session.    Pertinent History chronic BLE leg swelling and associated pain ~ 40 years; hx herniated intervertebral discL TKA; L total knee revision; HTN, Obesity. dementia; restless leg; CHF; unstable anina; R RC arthroplasty, OA, cervical spondylosis with myelopathy, chronic lower back pain, CKD (Stage?); Fibromyalgia; Hypothyroidism; OSA ( cPap?); hx pneumonia; RA, Type II Diabetes. S/P cardiac cathg, B THA, Lumbar laminectomy, posterior lumbar fusion, B shoulder arthroplasty; total abdominal hysterectomy    Limitations chronic leg swelling and associated pain, difficulty walking, impaired dynamic balance, muscle weakness,    Repetition Increases Symptoms    Special Tests + STEMMER sign base of toes bilaterally    Pain Onset --   s/p 45 yrs                       OT Treatments/Exercises (OP) - 04/16/20 0001      ADLs   ADL Education Given Yes      Manual Therapy   Manual Therapy Edema management;Manual Lymphatic Drainage (MLD);Compression Bandaging    Manual Lymphatic Drainage (MLD)  LLE MLD to LLE/LLQ utilizing modified "short neck sequence" with no strokes to neck in keeping w' LE thyroid precautions. UTILIZED DEEP ABDOMINAL PATHWAYS VIS  DIAPHRAGMATIC BREATHING, FUNCTIONAL INGUINAL ln, AND PROGIDED DYNAMIC STROKES TO LEG IN SEGMENTS FROM PROXIMAL TO DISTAL. eXCELLENT TOLERANCE.    Compression Bandaging L knee length multilayer gradient compression wraps as established.                   OT Education - 04/16/20 1340    Education Details Cont Pt edu re [place] care. Pt instructed to monitor RLE closely for recurrence of cellulitis due to sudden dramatic increase in unilateral swelling.    Person(s) Educated Patient    Methods  Explanation;Demonstration;Handout    Comprehension Verbalized understanding;Returned demonstration;Need further instruction               OT Long Term Goals - 03/28/20 1345      OT LONG TERM GOAL #1   Title Pt will be able to apply LLE multi-layer, short stretch compression wraps daily with maximum caregiver assistance, using correct gradient techniques independently to return affected limb/s, as closely as possible, to premorbid size and shape, to limit infection risk, and to improve safe functional mobility and ADLs performance.    Baseline dependent    Time 4    Period Days    Status Partially Met   Pt has no assistance at SNF for compression wrapping. Pt launders and rolls bandages independently. Pt still requires ongoing verbal cues to lift leg and reposition wheil assistant waraps. 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     OT LONG TERM GOAL #2   Title Pt will be able to verbalize signs and symptoms of cellulitis infection and identify 4 common lymphedema precautions using printed resource for reference (modified independence) to limit LE progression over time.    Baseline Max A    Time 4    Period Days    Status Achieved      OT LONG TERM GOAL #3   Title Pt will sustain than 85% compliance with all daily LE self-care home program components throughout Intensive Phase CDT, including impeccable skin care, lymphatic pumping therex,  compression wraps and simple self MLD, to ensure  optimal limb volume reduction, to limit infection risk and to limit LE progression.    Baseline dependent    Time 12    Period Weeks    Status Partially Met   Pt has achieved ~ 75% compliance with LE self care home program. Pt unableto reapply wraps between visits if she has to remove them     .     OT LONG TERM GOAL #4   Title Pt to achieve at least 10% LLE limb volume reduction during Intensive Phase CDT with Max CG assistance for wrapping, to improve functional performance of basic and instrumental ADLs, and to limit LE progression.    Baseline dependent    Time 12    Period Weeks    Status Partially Met   Met for all LLE segments and for full leg. Reduction goal for overall reduction of LLE limb volume to 20%     OT LONG TERM GOAL #5   Title During self-management phase of CDT Pt will retain limb volume reductions achieved during Intensive Phase CDT with no more than 3% volume increase using all LE self care components  to limit LE progression and further functional decline.    Baseline dependent    Time 6    Period Months    Status On-going                 Plan - 04/16/20 1342    Clinical Impression Statement Reviewed compression garment specifications and clinical reasoning for knee length garment recommendation one again by patient request. Pt's preference is for mid thigh length garment that will end at mid thigh. Pt verbalized understanding that garments ending at mid thigh typically slide  down causing bunching at the knee and ankle, and in turn worsen swelling and leg pain. Pt tolerated LLE MLD and compressionw raps as established. Garments have been shipped per DME vendor and are due to be delivered any day. Cont as per POC.  Patient will benefit from skilled therapeutic intervention in order to improve the following deficits and impairments:           Visit Diagnosis: Lymphedema, not elsewhere classified    Problem List Patient Active Problem List    Diagnosis Date Noted  . Iron deficiency 08/06/2018  . History of total left knee replacement (TKR) 07/18/2018  . Rectal bleeding 07/05/2018  . Hemorrhoids 07/05/2018  . Anal itching 07/05/2018  . Rotator cuff arthropathy of right shoulder   . Shoulder arthritis 06/15/2017  . Status post revision of total knee replacement, left 04/29/2017  . Polyethylene liner wear following left total knee arthroplasty requiring isolated polyethylene liner exchange ([place]) 03/02/2017  . 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 Unstable angina ([place]) 05/21/2016  . Chronic diastolic heart failure ([place]) 03/29/2016  . Normal coronary arteries 2009 01/14/2015  . Obesity-BMI 40 01/14/2015  . Restless leg 01/14/2015  . Chest pain 01/14/2015  . Back pain 01/14/2015  . Renal insufficiency 01/14/2015  . Dementia- mild memory issues 01/14/2015  . Occult blood positive stool 12/14/2014  . Anemia 12/14/2014  . Family history of colon cancer 12/14/2014  . Change in bowel habits 12/14/2014  . GERD (gastroesophageal reflux disease) 12/14/2014  . Dyspnea 05/17/2012  . Lymphedema 05/17/2012  . Weight gain 05/17/2012  . HTN (hypertension) 05/17/2012    Andrey Spearman, MS, OTR/L, [facility] 04/16/20 1:47 PM   [place] MAIN [facility] SERVICES [address] [place], [place], [address] Phone: [phone]   Fax:  [phone]  Name: CATHLYN TERSIGNI MRN: [id number] Date of Birth: 1945-10-13

## 2020-04-18 ENCOUNTER — Ambulatory Visit: Payer: Medicare Other | Admitting: Occupational Therapy

## 2020-04-18 ENCOUNTER — Other Ambulatory Visit: Payer: Self-pay

## 2020-04-18 DIAGNOSIS — I890 Lymphedema, not elsewhere classified: Secondary | ICD-10-CM

## 2020-04-18 NOTE — Therapy (Signed)
[place] MAIN [facility] SERVICES [address] [place], [place], [address] Phone: [phone]   Fax:  [phone]  Occupational Therapy Treatment  Patient Details  Name: [name] MRN: [id number] Date of Birth: [birth year] Referring Provider (OT): Juluis Pitch, MD   Encounter Date: 04/18/2020   OT End of Session - 04/18/20 1111    Visit Number 29    Number of Visits 36    Date for OT Re-Evaluation 05/09/20    OT Start Time 0944    OT Stop Time 1105    OT Time Calculation (min) 81 min    Activity Tolerance Patient tolerated treatment well;No increased pain    Behavior During Therapy WFL for tasks assessed/performed           Past Medical History:  Diagnosis Date  . Anemia   . Anxiety   . Arthritis    "hands, feet" (03/03/2017)  . Brain lesion    2 types  . Cataract   . Cervical spondylosis with myelopathy   . Chest pain    Normal cardiac cath 5/09  . Chronic lower back pain   . CKD (chronic kidney disease), stage III    [name]  . Congestive heart failure (CHF) ([place])    has a Cardiomems implant   . Constipation   . Dementia ([place])   . Depression   . Dumping syndrome   . Dyspnea    with activity  . Edema   . Fatigue   . Fibromyalgia   . GERD (gastroesophageal reflux disease)   . Headache    "w/high CBG" (03/03/2017)  . History of echocardiogram    a. Echo 03/20/16 (done at [facility] in [place], [place]):  mild LVH, EF 93%, normal diastolic function, mild LAE, MAC, RVSP 25 mmHg  . Hyperlipidemia   . Hypertension   . Hypothyroidism   . Lumbar spondylosis   . Lymphedema    seeing specialist for this  . Migraine    "mostly stopped when I changed my diet" (03/03/2017)  . OSA on CPAP   . Pneumonia X 1  . PONV (postoperative nausea and vomiting)   . Restless leg syndrome   . Rheumatoid arthritis ([place])   . Stroke [facility])    TIAs "mini strokes"- unsure of last TIA - pt and daughter deny this  .  Syncope   . Tremors of nervous system   . Type II diabetes mellitus ([place])     Past Surgical History:  Procedure Laterality Date  . APPENDECTOMY  1966  . BACK SURGERY    . CARDIAC CATHETERIZATION N/A 05/25/2016   Procedure: Right/Left Heart Cath and Coronary Angiography;  Surgeon: Larey Dresser, MD;  Location: [place] [facility];  Service: Cardiovascular;  Laterality: N/A;  . CATARACT EXTRACTION W/ INTRAOCULAR LENS  IMPLANT, BILATERAL Bilateral   . COLONOSCOPY    . CORONARY ANGIOGRAM  2009   Normal coronaries  . EXCISION/RELEASE BURSA HIP Bilateral   . I & D KNEE WITH POLY EXCHANGE Left 03/02/2017   Procedure: Left knee Revision of poly liner;  Surgeon: Mcarthur Rossetti, MD;  Location: [place];  Service: Orthopedics;  Laterality: Left;  . JOINT REPLACEMENT    . KNEE ARTHROSCOPY Bilateral   . LAPAROSCOPIC CHOLECYSTECTOMY  2015  . LMF  2017   CardiMEMS HF implant for CHF (measures amount of fluid in the heart)  . LUMBAR DISC SURGERY    . LUMBAR LAMINECTOMY/DECOMPRESSION MICRODISCECTOMY Left  12/2005   L2-3 laminectomy and diskectomy/notes 01/06/2011  . POSTERIOR LUMBAR FUSION  04/2006   [name] 01/06/2011; "put cages in"  . REVERSE SHOULDER ARTHROPLASTY Left 06/15/2017   Procedure: REVERSE LEFT SHOULDER ARTHROPLASTY;  Surgeon: Meredith Pel, MD;  Location: [place];  Service: Orthopedics;  Laterality: Left;  . REVERSE SHOULDER ARTHROPLASTY Right 02/01/2018  . REVERSE SHOULDER ARTHROPLASTY Right 02/01/2018   Procedure: RIGHT REVERSE SHOULDER ARTHROPLASTY;  Surgeon: Meredith Pel, MD;  Location: [place];  Service: Orthopedics;  Laterality: Right;  . REVISION TOTAL KNEE ARTHROPLASTY Left 03/02/2017   poly liner/notes 03/02/2017  . RIGHT HEART CATH N/A 11/13/2016   Procedure: Right Heart Cath with Cardiomems;  Surgeon: Larey Dresser, MD;  Location: [place] [facility];  Service: Cardiovascular;  Laterality: N/A;  . SHOULDER ARTHROSCOPY WITH ROTATOR CUFF REPAIR Right   . SHOULDER  OPEN ROTATOR CUFF REPAIR Left 01/2011   [name] 01/29/2011  . TOTAL ABDOMINAL HYSTERECTOMY    . TOTAL KNEE ARTHROPLASTY Left   . TOTAL KNEE ARTHROPLASTY Right 11/18/2012   Procedure: TOTAL KNEE ARTHROPLASTY;  Surgeon: Yvette Rack., MD;  Location: [place];  Service: Orthopedics;  Laterality: Right;  . TUBAL LIGATION    . UPPER GASTROINTESTINAL ENDOSCOPY    . UPPER GI ENDOSCOPY      There were no vitals filed for this visit.   Subjective Assessment - 04/18/20 0944    Subjective  [name] presents to OT for Rx visit 30/36 to address BLE lymphedema. Pt c/o cramping in legs overnight. Pain not rated numerically this morning. Pt has no new complaints.    Pertinent History chronic BLE leg swelling and associated pain ~ 40 years; hx herniated intervertebral discL TKA; L total knee revision; HTN, Obesity. dementia; restless leg; CHF; unstable anina; R RC arthroplasty, OA, cervical spondylosis with myelopathy, chronic lower back pain, CKD (Stage?); Fibromyalgia; Hypothyroidism; OSA ( cPap?); hx pneumonia; RA, Type II Diabetes. S/P cardiac cathg, B THA, Lumbar laminectomy, posterior lumbar fusion, B shoulder arthroplasty; total abdominal hysterectomy    Limitations chronic leg swelling and associated pain, difficulty walking, impaired dynamic balance, muscle weakness,    Repetition Increases Symptoms    Special Tests + STEMMER sign base of toes bilaterally    Pain Onset --   s/p 45 yrs                       OT Treatments/Exercises (OP) - 04/18/20 0001      ADLs   ADL Education Given Yes      Manual Therapy   Manual Therapy Edema management;Manual Lymphatic Drainage (MLD);Compression Bandaging    Manual therapy comments volumetrics deferred to next visit    Manual Lymphatic Drainage (MLD)  LLE MLD to LLE/LLQ utilizing modified "short neck sequence" with no strokes to neck in keeping w' LE thyroid precautions. UTILIZED DEEP ABDOMINAL PATHWAYS VIS DIAPHRAGMATIC BREATHING, FUNCTIONAL  INGUINAL ln, AND PROGIDED DYNAMIC STROKES TO LEG IN SEGMENTS FROM PROXIMAL TO DISTAL. eXCELLENT TOLERANCE.    Compression Bandaging L knee length multilayer gradient compression wraps as established.                   OT Education - 04/18/20 1111    Education Details Cont Pt edu re [place] care. Pt instructed to monitor RLE closely for recurrence of cellulitis due to sudden dramatic increase in unilateral swelling.    Person(s) Educated Patient    Methods Explanation;Demonstration;Handout    Comprehension Verbalized understanding;Returned demonstration;Need  further instruction               OT Long Term Goals - 04/18/20 1126      OT LONG TERM GOAL #1   Title Pt will be able to apply LLE multi-layer, short stretch compression wraps daily with maximum caregiver assistance, using correct gradient techniques independently to return affected limb/s, as closely as possible, to premorbid size and shape, to limit infection risk, and to improve safe functional mobility and ADLs performance.    Baseline dependent    Time 4    Period Days    Status Achieved   Pt has no assistance at SNF for compression wrapping. Pt launders and rolls bandages independently. Pt still requires ongoing verbal cues to lift leg and reposition wheil assistant waraps. 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     OT LONG TERM GOAL #2   Title Pt will be able to verbalize signs and symptoms of cellulitis infection and identify 4 common lymphedema precautions using printed resource for reference (modified independence) to limit LE progression over time.    Baseline Max A    Time 4    Period Days    Status Achieved      OT LONG TERM GOAL #3   Title Pt will sustain than 85% compliance with all daily LE self-care home program components throughout Intensive Phase CDT, including impeccable skin care, lymphatic pumping therex,  compression wraps and simple self MLD, to ensure optimal limb volume reduction, to limit  infection risk and to limit LE progression.    Baseline dependent    Time 12    Period Weeks    Status Achieved   Pt has achieved ~ 75% compliance with LE self care home program. Pt unableto reapply wraps between visits if she has to remove them     .     OT LONG TERM GOAL #4   Title Pt to achieve at least 10% LLE limb volume reduction during Intensive Phase CDT with Max CG assistance for wrapping, to improve functional performance of basic and instrumental ADLs, and to limit LE progression.    Baseline dependent    Time 12    Period Weeks    Status Partially Met   Met for all LLE segments and for full leg. Reduction goal for overall reduction of LLE limb volume to 20%     OT LONG TERM GOAL #5   Title During self-management phase of CDT Pt will retain limb volume reductions achieved during Intensive Phase CDT with no more than 3% volume increase using all LE self care components  to limit LE progression and further functional decline.    Baseline dependent    Time 6    Period Months    Status On-going                 Plan - 04/18/20 1113    Clinical Impression Statement Hand brakes tightened on manual wc to reduce falls risk during transfers. Brakes have been loose and malfunctioning for several days. Discussed knee brace options, OTS and custom, with patient to be used in conjunction with compression garments. Pt in agreement with plan to try to build tolerance  to existing brace as it allows knee movement while supporting joint. This device does not cut off lymphatic flow thru bottleneck at lateral knee when applied correctly. Pt tolerated skin care, MLD and reapplication of LLE gradient compression wraps without increased pain. RLE remains slihtly reddened today, buty  less swollen. LE skin around distal leg appears cracked and dry. Next visit complete progress note and LLE comparative volumetrics. Fit LLE compression garment as soon as delvered from vendor. Scedule Flexi trial with  Tactile rep.           Patient will benefit from skilled therapeutic intervention in order to improve the following deficits and impairments:           Visit Diagnosis: Lymphedema, not elsewhere classified    Problem List Patient Active Problem List   Diagnosis Date Noted  . Iron deficiency 08/06/2018  . History of total left knee replacement (TKR) 07/18/2018  . Rectal bleeding 07/05/2018  . Hemorrhoids 07/05/2018  . Anal itching 07/05/2018  . Rotator cuff arthropathy of right shoulder   . Shoulder arthritis 06/15/2017  . Status post revision of total knee replacement, left 04/29/2017  . Polyethylene liner wear following left total knee arthroplasty requiring isolated polyethylene liner exchange ([place]) 03/02/2017  . 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) 03/02/2017  . Unstable angina ([place]) 05/21/2016  . Chronic diastolic heart failure ([place]) 03/29/2016  . Normal coronary arteries 2009 01/14/2015  . Obesity-BMI 40 01/14/2015  . Restless leg 01/14/2015  . Chest pain 01/14/2015  . Back pain 01/14/2015  . Renal insufficiency 01/14/2015  . Dementia- mild memory issues 01/14/2015  . Occult blood positive stool 12/14/2014  . Anemia 12/14/2014  . Family history of colon cancer 12/14/2014  . Change in bowel habits 12/14/2014  . GERD (gastroesophageal reflux disease) 12/14/2014  . Dyspnea 05/17/2012  . Lymphedema 05/17/2012  . Weight gain 05/17/2012  . HTN (hypertension) 05/17/2012    Andrey Spearman, MS, OTR/L, [facility] 04/18/20 11:27 AM  [place] MAIN [facility] SERVICES [address] [place], [place], [address] Phone: [phone]   Fax:  [phone]  Name: [name] MRN: [id number] Date of Birth: 1946/01/22

## 2020-04-19 ENCOUNTER — Ambulatory Visit: Payer: Medicare Other | Admitting: Occupational Therapy

## 2020-04-19 ENCOUNTER — Other Ambulatory Visit: Payer: Self-pay

## 2020-04-19 DIAGNOSIS — I890 Lymphedema, not elsewhere classified: Secondary | ICD-10-CM

## 2020-04-19 NOTE — Therapy (Signed)
[place] MAIN [facility] SERVICES [address] [place], [place], [address] Phone: [phone]   Fax:  [phone]  Occupational Therapy Treatment  Patient Details  Name: [name] MRN: [id number] Date of Birth: February 19, 1946 Referring Provider (OT): Juluis Pitch, MD   Encounter Date: 04/19/2020   OT End of Session - 04/19/20 1009    Visit Number 30    Number of Visits 36    Date for OT Re-Evaluation 05/09/20    OT Start Time 0950    OT Stop Time 1100    OT Time Calculation (min) 70 min    Activity Tolerance Patient tolerated treatment well;No increased pain    Behavior During Therapy WFL for tasks assessed/performed           Past Medical History:  Diagnosis Date  . Anemia   . Anxiety   . Arthritis    "hands, feet" (03/03/2017)  . Brain lesion    2 types  . Cataract   . Cervical spondylosis with myelopathy   . Chest pain    Normal cardiac cath 5/09  . Chronic lower back pain   . CKD (chronic kidney disease), stage III    [name]  . Congestive heart failure (CHF) ([place])    has a Cardiomems implant   . Constipation   . Dementia ([place])   . Depression   . Dumping syndrome   . Dyspnea    with activity  . Edema   . Fatigue   . Fibromyalgia   . GERD (gastroesophageal reflux disease)   . Headache    "w/high CBG" (03/03/2017)  . History of echocardiogram    a. Echo 03/20/16 (done at [facility] in [place], [place]):  mild LVH, EF 02%, normal diastolic function, mild LAE, MAC, RVSP 25 mmHg  . Hyperlipidemia   . Hypertension   . Hypothyroidism   . Lumbar spondylosis   . Lymphedema    seeing specialist for this  . Migraine    "mostly stopped when I changed my diet" (03/03/2017)  . OSA on CPAP   . Pneumonia X 1  . PONV (postoperative nausea and vomiting)   . Restless leg syndrome   . Rheumatoid arthritis ([place])   . Stroke [facility])    TIAs "mini strokes"- unsure of last TIA - pt and daughter deny this  .  Syncope   . Tremors of nervous system   . Type II diabetes mellitus ([place])     Past Surgical History:  Procedure Laterality Date  . APPENDECTOMY  1966  . BACK SURGERY    . CARDIAC CATHETERIZATION N/A 05/25/2016   Procedure: Right/Left Heart Cath and Coronary Angiography;  Surgeon: Larey Dresser, MD;  Location: [place] CV LAB;  Service: Cardiovascular;  Laterality: N/A;  . CATARACT EXTRACTION W/ INTRAOCULAR LENS  IMPLANT, BILATERAL Bilateral   . COLONOSCOPY    . CORONARY ANGIOGRAM  2009   Normal coronaries  . EXCISION/RELEASE BURSA HIP Bilateral   . I & D KNEE WITH POLY EXCHANGE Left 03/02/2017   Procedure: Left knee Revision of poly liner;  Surgeon: Mcarthur Rossetti, MD;  Location: [place];  Service: Orthopedics;  Laterality: Left;  . JOINT REPLACEMENT    . KNEE ARTHROSCOPY Bilateral   . LAPAROSCOPIC CHOLECYSTECTOMY  2015  . LMF  2017   CardiMEMS HF implant for CHF (measures amount of fluid in the heart)  . LUMBAR DISC SURGERY    . LUMBAR LAMINECTOMY/DECOMPRESSION MICRODISCECTOMY Left  12/2005   L2-3 laminectomy and diskectomy/notes 01/06/2011  . POSTERIOR LUMBAR FUSION  04/2006   [name] 01/06/2011; "put cages in"  . REVERSE SHOULDER ARTHROPLASTY Left 06/15/2017   Procedure: REVERSE LEFT SHOULDER ARTHROPLASTY;  Surgeon: Meredith Pel, MD;  Location: [place];  Service: Orthopedics;  Laterality: Left;  . REVERSE SHOULDER ARTHROPLASTY Right 02/01/2018  . REVERSE SHOULDER ARTHROPLASTY Right 02/01/2018   Procedure: RIGHT REVERSE SHOULDER ARTHROPLASTY;  Surgeon: Meredith Pel, MD;  Location: [place];  Service: Orthopedics;  Laterality: Right;  . REVISION TOTAL KNEE ARTHROPLASTY Left 03/02/2017   poly liner/notes 03/02/2017  . RIGHT HEART CATH N/A 11/13/2016   Procedure: Right Heart Cath with Cardiomems;  Surgeon: Larey Dresser, MD;  Location: [place] CV LAB;  Service: Cardiovascular;  Laterality: N/A;  . SHOULDER ARTHROSCOPY WITH ROTATOR CUFF REPAIR Right   . SHOULDER  OPEN ROTATOR CUFF REPAIR Left 01/2011   [name] 01/29/2011  . TOTAL ABDOMINAL HYSTERECTOMY    . TOTAL KNEE ARTHROPLASTY Left   . TOTAL KNEE ARTHROPLASTY Right 11/18/2012   Procedure: TOTAL KNEE ARTHROPLASTY;  Surgeon: Yvette Rack., MD;  Location: [place];  Service: Orthopedics;  Laterality: Right;  . TUBAL LIGATION    . UPPER GASTROINTESTINAL ENDOSCOPY    . UPPER GI ENDOSCOPY      There were no vitals filed for this visit.   Subjective Assessment - 04/19/20 1009    Subjective  [name] presents to OT for Rx visit 30/36 to address BLE lymphedema. Pt c/o being cold this morning. Pt was able to kep compression wraps in place since yesterday in preparation for completing volumetrics.    Pertinent History chronic BLE leg swelling and associated pain ~ 40 years; hx herniated intervertebral discL TKA; L total knee revision; HTN, Obesity. dementia; restless leg; CHF; unstable anina; R RC arthroplasty, OA, cervical spondylosis with myelopathy, chronic lower back pain, CKD (Stage?); Fibromyalgia; Hypothyroidism; OSA ( cPap?); hx pneumonia; RA, Type II Diabetes. S/P cardiac cathg, B THA, Lumbar laminectomy, posterior lumbar fusion, B shoulder arthroplasty; total abdominal hysterectomy    Limitations chronic leg swelling and associated pain, difficulty walking, impaired dynamic balance, muscle weakness,    Repetition Increases Symptoms    Special Tests + STEMMER sign base of toes bilaterally    Pain Onset --   s/p 45 yrs                       OT Treatments/Exercises (OP) - 04/19/20 0001      ADLs   ADL Education Given Yes      Manual Therapy   Manual Therapy Edema management    Manual therapy comments LLE comparative limb volumetrics                       OT Long Term Goals - 04/18/20 1126      OT LONG TERM GOAL #1   Title Pt will be able to apply LLE multi-layer, short stretch compression wraps daily with maximum caregiver assistance, using correct gradient  techniques independently to return affected limb/s, as closely as possible, to premorbid size and shape, to limit infection risk, and to improve safe functional mobility and ADLs performance.    Baseline dependent    Time 4    Period Days    Status Achieved   Pt has no assistance at SNF for compression wrapping. Pt launders and rolls bandages independently. Pt still requires ongoing verbal cues to lift leg  and reposition wheil assistant waraps. 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     OT LONG TERM GOAL #2   Title Pt will be able to verbalize signs and symptoms of cellulitis infection and identify 4 common lymphedema precautions using printed resource for reference (modified independence) to limit LE progression over time.    Baseline Max A    Time 4    Period Days    Status Achieved      OT LONG TERM GOAL #3   Title Pt will sustain than 85% compliance with all daily LE self-care home program components throughout Intensive Phase CDT, including impeccable skin care, lymphatic pumping therex,  compression wraps and simple self MLD, to ensure optimal limb volume reduction, to limit infection risk and to limit LE progression.    Baseline dependent    Time 12    Period Weeks    Status Achieved   Pt has achieved ~ 75% compliance with LE self care home program. Pt unableto reapply wraps between visits if she has to remove them     .     OT LONG TERM GOAL #4   Title Pt to achieve at least 10% LLE limb volume reduction during Intensive Phase CDT with Max CG assistance for wrapping, to improve functional performance of basic and instrumental ADLs, and to limit LE progression.    Baseline dependent    Time 12    Period Weeks    Status Partially Met   Met for all LLE segments and for full leg. Reduction goal for overall reduction of LLE limb volume to 20%     OT LONG TERM GOAL #5   Title During self-management phase of CDT Pt will retain limb volume reductions achieved during Intensive Phase CDT  with no more than 3% volume increase using all LE self care components  to limit LE progression and further functional decline.    Baseline dependent    Time 6    Period Months    Status On-going                 Plan - 04/19/20 1231    Clinical Impression Statement Comparative LLE limb volumetrics reveal Pt has met and exceeded the initial 10% volumetric reduction goal for LLE. L leg is decreased by 15.7%, thigh is decreased by 29.63% and full LLE is decreased by 22.9% since June 18. LLE garents are on order. Pt continues to tolerate all aspects of CDT.Cont as per POC.           Patient will benefit from skilled therapeutic intervention in order to improve the following deficits and impairments:           Visit Diagnosis: Lymphedema, not elsewhere classified    Problem List Patient Active Problem List   Diagnosis Date Noted  . Iron deficiency 08/06/2018  . History of total left knee replacement (TKR) 07/18/2018  . Rectal bleeding 07/05/2018  . Hemorrhoids 07/05/2018  . Anal itching 07/05/2018  . Rotator cuff arthropathy of right shoulder   . Shoulder arthritis 06/15/2017  . Status post revision of total knee replacement, left 04/29/2017  . Polyethylene liner wear following left total knee arthroplasty requiring isolated polyethylene liner exchange ([place]) 03/02/2017  . 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 Unstable angina ([place]) 05/21/2016  . Chronic diastolic heart failure ([place]) 03/29/2016  . Normal coronary arteries 2009 01/14/2015  . Obesity-BMI 40 01/14/2015  . Restless leg 01/14/2015  . Chest  pain 01/14/2015  . Back pain 01/14/2015  . Renal insufficiency 01/14/2015  . Dementia- mild memory issues 01/14/2015  . Occult blood positive stool 12/14/2014  . Anemia 12/14/2014  . Family history of colon cancer 12/14/2014  . Change in bowel habits 12/14/2014  . GERD (gastroesophageal reflux disease) 12/14/2014  . Dyspnea 05/17/2012    . Lymphedema 05/17/2012  . Weight gain 05/17/2012  . HTN (hypertension) 05/17/2012    Andrey Spearman, MS, OTR/L, [facility] 04/19/20 12:44 PM  [place] MAIN [facility] SERVICES [address] [place], [place], [address] Phone: [phone]   Fax:  [phone]  Name: [name] MRN: [id number] Date of Birth: 1946-06-01

## 2020-04-23 ENCOUNTER — Other Ambulatory Visit: Payer: Self-pay

## 2020-04-23 ENCOUNTER — Ambulatory Visit: Payer: Medicare Other | Admitting: Occupational Therapy

## 2020-04-23 DIAGNOSIS — I890 Lymphedema, not elsewhere classified: Secondary | ICD-10-CM | POA: Diagnosis not present

## 2020-04-23 NOTE — Therapy (Signed)
[place] MAIN [facility] SERVICES [address] [place], [place], [address] Phone: [phone]   Fax:  [phone]  Occupational Therapy Treatment  Patient Details  Name: [name] MRN: [id number] Date of Birth: 1946-01-02 Referring Provider (OT): Juluis Pitch, MD   Encounter Date: 04/23/2020   OT End of Session - 04/23/20 1229    Visit Number 31    Number of Visits 36    Date for OT Re-Evaluation 05/09/20    OT Start Time 1007    OT Stop Time 1105    OT Time Calculation (min) 58 min    Activity Tolerance Patient tolerated treatment well;No increased pain    Behavior During Therapy WFL for tasks assessed/performed           Past Medical History:  Diagnosis Date  . Anemia   . Anxiety   . Arthritis    "hands, feet" (03/03/2017)  . Brain lesion    2 types  . Cataract   . Cervical spondylosis with myelopathy   . Chest pain    Normal cardiac cath 5/09  . Chronic lower back pain   . CKD (chronic kidney disease), stage III    [name]  . Congestive heart failure (CHF) ([place])    has a Cardiomems implant   . Constipation   . Dementia ([place])   . Depression   . Dumping syndrome   . Dyspnea    with activity  . Edema   . Fatigue   . Fibromyalgia   . GERD (gastroesophageal reflux disease)   . Headache    "w/high CBG" (03/03/2017)  . History of echocardiogram    a. Echo 03/20/16 (done at [facility] in [place], [place]):  mild LVH, EF 60%, normal diastolic function, mild LAE, MAC, RVSP 25 mmHg  . Hyperlipidemia   . Hypertension   . Hypothyroidism   . Lumbar spondylosis   . Lymphedema    seeing specialist for this  . Migraine    "mostly stopped when I changed my diet" (03/03/2017)  . OSA on CPAP   . Pneumonia X 1  . PONV (postoperative nausea and vomiting)   . Restless leg syndrome   . Rheumatoid arthritis ([place])   . Stroke [facility])    TIAs "mini strokes"- unsure of last TIA - pt and daughter deny this  .  Syncope   . Tremors of nervous system   . Type II diabetes mellitus ([place])     Past Surgical History:  Procedure Laterality Date  . APPENDECTOMY  1966  . BACK SURGERY    . CARDIAC CATHETERIZATION N/A 05/25/2016   Procedure: Right/Left Heart Cath and Coronary Angiography;  Surgeon: Larey Dresser, MD;  Location: [place] CV LAB;  Service: Cardiovascular;  Laterality: N/A;  . CATARACT EXTRACTION W/ INTRAOCULAR LENS  IMPLANT, BILATERAL Bilateral   . COLONOSCOPY    . CORONARY ANGIOGRAM  2009   Normal coronaries  . EXCISION/RELEASE BURSA HIP Bilateral   . I & D KNEE WITH POLY EXCHANGE Left 03/02/2017   Procedure: Left knee Revision of poly liner;  Surgeon: Mcarthur Rossetti, MD;  Location: [place];  Service: Orthopedics;  Laterality: Left;  . JOINT REPLACEMENT    . KNEE ARTHROSCOPY Bilateral   . LAPAROSCOPIC CHOLECYSTECTOMY  2015  . LMF  2017   CardiMEMS HF implant for CHF (measures amount of fluid in the heart)  . LUMBAR DISC SURGERY    . LUMBAR LAMINECTOMY/DECOMPRESSION MICRODISCECTOMY Left  12/2005   L2-3 laminectomy and diskectomy/notes 01/06/2011  . POSTERIOR LUMBAR FUSION  04/2006   [name] 01/06/2011; "put cages in"  . REVERSE SHOULDER ARTHROPLASTY Left 06/15/2017   Procedure: REVERSE LEFT SHOULDER ARTHROPLASTY;  Surgeon: Meredith Pel, MD;  Location: [place];  Service: Orthopedics;  Laterality: Left;  . REVERSE SHOULDER ARTHROPLASTY Right 02/01/2018  . REVERSE SHOULDER ARTHROPLASTY Right 02/01/2018   Procedure: RIGHT REVERSE SHOULDER ARTHROPLASTY;  Surgeon: Meredith Pel, MD;  Location: [place];  Service: Orthopedics;  Laterality: Right;  . REVISION TOTAL KNEE ARTHROPLASTY Left 03/02/2017   poly liner/notes 03/02/2017  . RIGHT HEART CATH N/A 11/13/2016   Procedure: Right Heart Cath with Cardiomems;  Surgeon: Larey Dresser, MD;  Location: [place] CV LAB;  Service: Cardiovascular;  Laterality: N/A;  . SHOULDER ARTHROSCOPY WITH ROTATOR CUFF REPAIR Right   . SHOULDER  OPEN ROTATOR CUFF REPAIR Left 01/2011   [name] 01/29/2011  . TOTAL ABDOMINAL HYSTERECTOMY    . TOTAL KNEE ARTHROPLASTY Left   . TOTAL KNEE ARTHROPLASTY Right 11/18/2012   Procedure: TOTAL KNEE ARTHROPLASTY;  Surgeon: Yvette Rack., MD;  Location: [place];  Service: Orthopedics;  Laterality: Right;  . TUBAL LIGATION    . UPPER GASTROINTESTINAL ENDOSCOPY    . UPPER GI ENDOSCOPY      There were no vitals filed for this visit.   Subjective Assessment - 04/23/20 1014    Subjective  [name] presents to OT for Rx visit 31/36 to address BLE lymphedema. Pt has no new complaints this morning . She denies lymphedema related pain in legs.    Pertinent History chronic BLE leg swelling and associated pain ~ 40 years; hx herniated intervertebral discL TKA; L total knee revision; HTN, Obesity. dementia; restless leg; CHF; unstable anina; R RC arthroplasty, OA, cervical spondylosis with myelopathy, chronic lower back pain, CKD (Stage?); Fibromyalgia; Hypothyroidism; OSA ( cPap?); hx pneumonia; RA, Type II Diabetes. S/P cardiac cathg, B THA, Lumbar laminectomy, posterior lumbar fusion, B shoulder arthroplasty; total abdominal hysterectomy    Limitations chronic leg swelling and associated pain, difficulty walking, impaired dynamic balance, muscle weakness,    Repetition Increases Symptoms    Special Tests + STEMMER sign base of toes bilaterally    Pain Onset --   s/p 45 yrs                       OT Treatments/Exercises (OP) - 04/23/20 0001      ADLs   ADL Education Given Yes      Manual Therapy   Manual Therapy Edema management;Manual Lymphatic Drainage (MLD);Compression Bandaging    Manual Lymphatic Drainage (MLD)  LLE MLD to LLE/LLQ utilizing modified "short neck sequence" with no strokes to neck in keeping w' LE thyroid precautions. UTILIZED DEEP ABDOMINAL PATHWAYS VIS DIAPHRAGMATIC BREATHING, FUNCTIONAL INGUINAL ln, AND PROGIDED DYNAMIC STROKES TO LEG IN SEGMENTS FROM PROXIMAL TO DISTAL.  eXCELLENT TOLERANCE.    Compression Bandaging L knee length multilayer gradient compression wraps as established.                   OT Education - 04/23/20 1019    Education Details Cont Pt edu re [place] care. Pt instructed to monitor RLE closely for recurrence of cellulitis due to sudden dramatic increase in unilateral swelling.    Person(s) Educated Patient    Methods Explanation;Demonstration;Handout    Comprehension Verbalized understanding;Returned demonstration;Need further instruction  OT Long Term Goals - 04/18/20 1126      OT LONG TERM GOAL #1   Title Pt will be able to apply LLE multi-layer, short stretch compression wraps daily with maximum caregiver assistance, using correct gradient techniques independently to return affected limb/s, as closely as possible, to premorbid size and shape, to limit infection risk, and to improve safe functional mobility and ADLs performance.    Baseline dependent    Time 4    Period Days    Status Achieved   Pt has no assistance at SNF for compression wrapping. Pt launders and rolls bandages independently. Pt still requires ongoing verbal cues to lift leg and reposition wheil assistant waraps. 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     OT LONG TERM GOAL #2   Title Pt will be able to verbalize signs and symptoms of cellulitis infection and identify 4 common lymphedema precautions using printed resource for reference (modified independence) to limit LE progression over time.    Baseline Max A    Time 4    Period Days    Status Achieved      OT LONG TERM GOAL #3   Title Pt will sustain than 85% compliance with all daily LE self-care home program components throughout Intensive Phase CDT, including impeccable skin care, lymphatic pumping therex,  compression wraps and simple self MLD, to ensure optimal limb volume reduction, to limit infection risk and to limit LE progression.    Baseline dependent    Time 12     Period Weeks    Status Achieved   Pt has achieved ~ 75% compliance with LE self care home program. Pt unableto reapply wraps between visits if she has to remove them     .     OT LONG TERM GOAL #4   Title Pt to achieve at least 10% LLE limb volume reduction during Intensive Phase CDT with Max CG assistance for wrapping, to improve functional performance of basic and instrumental ADLs, and to limit LE progression.    Baseline dependent    Time 12    Period Weeks    Status Partially Met   Met for all LLE segments and for full leg. Reduction goal for overall reduction of LLE limb volume to 20%     OT LONG TERM GOAL #5   Title During self-management phase of CDT Pt will retain limb volume reductions achieved during Intensive Phase CDT with no more than 3% volume increase using all LE self care components  to limit LE progression and further functional decline.    Baseline dependent    Time 6    Period Months    Status On-going                 Plan - 04/23/20 1229    Clinical Impression Statement Pt tolerated MLD, skin care and knee length gradient compression wraps to LLE today without increased pain. Pt now consistently able to lift both legs onto treatment bed with open hip angle positioning . Awaiting garment delivery for fitting. Cnot as per POC.           Patient will benefit from skilled therapeutic intervention in order to improve the following deficits and impairments:           Visit Diagnosis: Lymphedema, not elsewhere classified    Problem List Patient Active Problem List   Diagnosis Date Noted  . Iron deficiency 08/06/2018  . History of total left knee replacement (TKR)  07/18/2018  . Rectal bleeding 07/05/2018  . Hemorrhoids 07/05/2018  . Anal itching 07/05/2018  . Rotator cuff arthropathy of right shoulder   . Shoulder arthritis 06/15/2017  . Status post revision of total knee replacement, left 04/29/2017  . Polyethylene liner wear following left total  knee arthroplasty requiring isolated polyethylene liner exchange ([place]) 03/02/2017  . 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 Unstable angina ([place]) 05/21/2016  . Chronic diastolic heart failure ([place]) 03/29/2016  . Normal coronary arteries 2009 01/14/2015  . Obesity-BMI 40 01/14/2015  . Restless leg 01/14/2015  . Chest pain 01/14/2015  . Back pain 01/14/2015  . Renal insufficiency 01/14/2015  . Dementia- mild memory issues 01/14/2015  . Occult blood positive stool 12/14/2014  . Anemia 12/14/2014  . Family history of colon cancer 12/14/2014  . Change in bowel habits 12/14/2014  . GERD (gastroesophageal reflux disease) 12/14/2014  . Dyspnea 05/17/2012  . Lymphedema 05/17/2012  . Weight gain 05/17/2012  . HTN (hypertension) 05/17/2012    Andrey Spearman, MS, OTR/L, [facility] 04/23/20 12:34 PM   [place] MAIN [facility] SERVICES [address] [place], [place], [address] Phone: [phone]   Fax:  [phone]  Name: [name] MRN: [id number] Date of Birth: [birth year]

## 2020-04-24 ENCOUNTER — Ambulatory Visit: Payer: Medicare Other | Attending: Family Medicine | Admitting: Occupational Therapy

## 2020-04-24 ENCOUNTER — Other Ambulatory Visit: Payer: Self-pay

## 2020-04-24 DIAGNOSIS — I890 Lymphedema, not elsewhere classified: Secondary | ICD-10-CM | POA: Insufficient documentation

## 2020-04-24 NOTE — Therapy (Signed)
[place] MAIN [facility] SERVICES [address] [place], [place], [address] Phone: [phone]   Fax:  [phone]  Occupational Therapy Treatment  Patient Details  Name: [name] MRN: [id number] Date of Birth: Aug 16, 1946 Referring Provider (OT): Juluis Pitch, MD   Encounter Date: 04/24/2020   OT End of Session - 04/24/20 1256    Visit Number 32    Number of Visits 36    Date for OT Re-Evaluation 05/09/20    OT Start Time 0100    OT Stop Time 0215    OT Time Calculation (min) 75 min    Activity Tolerance Patient tolerated treatment well;No increased pain    Behavior During Therapy WFL for tasks assessed/performed           Past Medical History:  Diagnosis Date  . Anemia   . Anxiety   . Arthritis    "hands, feet" (03/03/2017)  . Brain lesion    2 types  . Cataract   . Cervical spondylosis with myelopathy   . Chest pain    Normal cardiac cath 5/09  . Chronic lower back pain   . CKD (chronic kidney disease), stage III    [name]  . Congestive heart failure (CHF) ([place])    has a Cardiomems implant   . Constipation   . Dementia ([place])   . Depression   . Dumping syndrome   . Dyspnea    with activity  . Edema   . Fatigue   . Fibromyalgia   . GERD (gastroesophageal reflux disease)   . Headache    "w/high CBG" (03/03/2017)  . History of echocardiogram    a. Echo 03/20/16 (done at [facility] in [place], [place]):  mild LVH, EF 67%, normal diastolic function, mild LAE, MAC, RVSP 25 mmHg  . Hyperlipidemia   . Hypertension   . Hypothyroidism   . Lumbar spondylosis   . Lymphedema    seeing specialist for this  . Migraine    "mostly stopped when I changed my diet" (03/03/2017)  . OSA on CPAP   . Pneumonia X 1  . PONV (postoperative nausea and vomiting)   . Restless leg syndrome   . Rheumatoid arthritis ([place])   . Stroke [facility])    TIAs "mini strokes"- unsure of last TIA - pt and daughter deny this  .  Syncope   . Tremors of nervous system   . Type II diabetes mellitus ([place])     Past Surgical History:  Procedure Laterality Date  . APPENDECTOMY  1966  . BACK SURGERY    . CARDIAC CATHETERIZATION N/A 05/25/2016   Procedure: Right/Left Heart Cath and Coronary Angiography;  Surgeon: Larey Dresser, MD;  Location: [place] [facility];  Service: Cardiovascular;  Laterality: N/A;  . CATARACT EXTRACTION W/ INTRAOCULAR LENS  IMPLANT, BILATERAL Bilateral   . COLONOSCOPY    . CORONARY ANGIOGRAM  2009   Normal coronaries  . EXCISION/RELEASE BURSA HIP Bilateral   . I & D KNEE WITH POLY EXCHANGE Left 03/02/2017   Procedure: Left knee Revision of poly liner;  Surgeon: Mcarthur Rossetti, MD;  Location: [place];  Service: Orthopedics;  Laterality: Left;  . JOINT REPLACEMENT    . KNEE ARTHROSCOPY Bilateral   . LAPAROSCOPIC CHOLECYSTECTOMY  2015  . LMF  2017   CardiMEMS HF implant for CHF (measures amount of fluid in the heart)  . LUMBAR DISC SURGERY    . LUMBAR LAMINECTOMY/DECOMPRESSION MICRODISCECTOMY Left  12/2005   L2-3 laminectomy and diskectomy/notes 01/06/2011  . POSTERIOR LUMBAR FUSION  04/2006   [name] 01/06/2011; "put cages in"  . REVERSE SHOULDER ARTHROPLASTY Left 06/15/2017   Procedure: REVERSE LEFT SHOULDER ARTHROPLASTY;  Surgeon: Meredith Pel, MD;  Location: [place];  Service: Orthopedics;  Laterality: Left;  . REVERSE SHOULDER ARTHROPLASTY Right 02/01/2018  . REVERSE SHOULDER ARTHROPLASTY Right 02/01/2018   Procedure: RIGHT REVERSE SHOULDER ARTHROPLASTY;  Surgeon: Meredith Pel, MD;  Location: [place];  Service: Orthopedics;  Laterality: Right;  . REVISION TOTAL KNEE ARTHROPLASTY Left 03/02/2017   poly liner/notes 03/02/2017  . RIGHT HEART CATH N/A 11/13/2016   Procedure: Right Heart Cath with Cardiomems;  Surgeon: Larey Dresser, MD;  Location: [place] [facility];  Service: Cardiovascular;  Laterality: N/A;  . SHOULDER ARTHROSCOPY WITH ROTATOR CUFF REPAIR Right   . SHOULDER  OPEN ROTATOR CUFF REPAIR Left 01/2011   [name] 01/29/2011  . TOTAL ABDOMINAL HYSTERECTOMY    . TOTAL KNEE ARTHROPLASTY Left   . TOTAL KNEE ARTHROPLASTY Right 11/18/2012   Procedure: TOTAL KNEE ARTHROPLASTY;  Surgeon: Yvette Rack., MD;  Location: [place];  Service: Orthopedics;  Laterality: Right;  . TUBAL LIGATION    . UPPER GASTROINTESTINAL ENDOSCOPY    . UPPER GI ENDOSCOPY      There were no vitals filed for this visit.   Subjective Assessment - 04/24/20 1256    Subjective  [name] presents to OT for Rx visit 32/36 to address BLE lymphedema. Pt reports she had dental appointment this morning. She reports B knee pain 7/10.    Pertinent History chronic BLE leg swelling and associated pain ~ 40 years; hx herniated intervertebral discL TKA; L total knee revision; HTN, Obesity. dementia; restless leg; CHF; unstable anina; R RC arthroplasty, OA, cervical spondylosis with myelopathy, chronic lower back pain, CKD (Stage?); Fibromyalgia; Hypothyroidism; OSA ( cPap?); hx pneumonia; RA, Type II Diabetes. S/P cardiac cathg, B THA, Lumbar laminectomy, posterior lumbar fusion, B shoulder arthroplasty; total abdominal hysterectomy    Limitations chronic leg swelling and associated pain, difficulty walking, impaired dynamic balance, muscle weakness,    Repetition Increases Symptoms    Special Tests + STEMMER sign base of toes bilaterally    Pain Onset --   s/p 45 yrs                       OT Treatments/Exercises (OP) - 04/24/20 0001      ADLs   ADL Education Given Yes      Manual Therapy   Manual Therapy Edema management;Compression Bandaging;Manual Lymphatic Drainage (MLD)    Manual Lymphatic Drainage (MLD)  LLE MLD to LLE/LLQ utilizing modified "short neck sequence" with no strokes to neck in keeping w' LE thyroid precautions. UTILIZED DEEP ABDOMINAL PATHWAYS VIS DIAPHRAGMATIC BREATHING, FUNCTIONAL INGUINAL ln, AND PROGIDED DYNAMIC STROKES TO LEG IN SEGMENTS FROM PROXIMAL TO DISTAL.  eXCELLENT TOLERANCE.    Compression Bandaging L knee length multilayer gradient compression wraps as established.                   OT Education - 04/24/20 1315    Education Details Cont Pt edu re [place] care. Pt instructed to monitor RLE closely for recurrence of cellulitis due to sudden dramatic increase in unilateral swelling.    Person(s) Educated Patient    Methods Explanation;Demonstration;Handout    Comprehension Verbalized understanding;Returned demonstration;Need further instruction  OT Long Term Goals - 04/18/20 1126      OT LONG TERM GOAL #1   Title Pt will be able to apply LLE multi-layer, short stretch compression wraps daily with maximum caregiver assistance, using correct gradient techniques independently to return affected limb/s, as closely as possible, to premorbid size and shape, to limit infection risk, and to improve safe functional mobility and ADLs performance.    Baseline dependent    Time 4    Period Days    Status Achieved   Pt has no assistance at SNF for compression wrapping. Pt launders and rolls bandages independently. Pt still requires ongoing verbal cues to lift leg and reposition wheil assistant waraps. 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     OT LONG TERM GOAL #2   Title Pt will be able to verbalize signs and symptoms of cellulitis infection and identify 4 common lymphedema precautions using printed resource for reference (modified independence) to limit LE progression over time.    Baseline Max A    Time 4    Period Days    Status Achieved      OT LONG TERM GOAL #3   Title Pt will sustain than 85% compliance with all daily LE self-care home program components throughout Intensive Phase CDT, including impeccable skin care, lymphatic pumping therex,  compression wraps and simple self MLD, to ensure optimal limb volume reduction, to limit infection risk and to limit LE progression.    Baseline dependent    Time 12     Period Weeks    Status Achieved   Pt has achieved ~ 75% compliance with LE self care home program. Pt unableto reapply wraps between visits if she has to remove them     .     OT LONG TERM GOAL #4   Title Pt to achieve at least 10% LLE limb volume reduction during Intensive Phase CDT with Max CG assistance for wrapping, to improve functional performance of basic and instrumental ADLs, and to limit LE progression.    Baseline dependent    Time 12    Period Weeks    Status Partially Met   Met for all LLE segments and for full leg. Reduction goal for overall reduction of LLE limb volume to 20%     OT LONG TERM GOAL #5   Title During self-management phase of CDT Pt will retain limb volume reductions achieved during Intensive Phase CDT with no more than 3% volume increase using all LE self care components  to limit LE progression and further functional decline.    Baseline dependent    Time 6    Period Months    Status On-going                 Plan - 04/24/20 1638    Clinical Impression Statement RLE presenting with substantially more swelling today and redness also increased since last visit, which was yesterday. Pt reminded to continue to monitor skin closely due to elevated infection risk. SNF notif9ied via log note that Pt's brakes on wc are still in disrepair and need adjustment to decrease falls risk. Pt tolerated MLD , skin care and compression wraps to LE today without difficulty today. Because RLE is so large she was unable to complete supine to sit EOB for transfer without mni A. Cont as per POC. Flexi trial tomorrow.           Patient will benefit from skilled therapeutic intervention in order to improve  the following deficits and impairments:           Visit Diagnosis: Lymphedema, not elsewhere classified    Problem List Patient Active Problem List   Diagnosis Date Noted  . Iron deficiency 08/06/2018  . History of total left knee replacement (TKR) 07/18/2018  .  Rectal bleeding 07/05/2018  . Hemorrhoids 07/05/2018  . Anal itching 07/05/2018  . Rotator cuff arthropathy of right shoulder   . Shoulder arthritis 06/15/2017  . Status post revision of total knee replacement, left 04/29/2017  . Polyethylene liner wear following left total knee arthroplasty requiring isolated polyethylene liner exchange ([place]) 03/02/2017  . 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 Unstable angina ([place]) 05/21/2016  . Chronic diastolic heart failure ([place]) 03/29/2016  . Normal coronary arteries 2009 01/14/2015  . Obesity-BMI 40 01/14/2015  . Restless leg 01/14/2015  . Chest pain 01/14/2015  . Back pain 01/14/2015  . Renal insufficiency 01/14/2015  . Dementia- mild memory issues 01/14/2015  . Occult blood positive stool 12/14/2014  . Anemia 12/14/2014  . Family history of colon cancer 12/14/2014  . Change in bowel habits 12/14/2014  . GERD (gastroesophageal reflux disease) 12/14/2014  . Dyspnea 05/17/2012  . Lymphedema 05/17/2012  . Weight gain 05/17/2012  . HTN (hypertension) 05/17/2012   Andrey Spearman, MS, OTR/L, [facility] 04/24/20 4:46 PM   [place] MAIN [facility] SERVICES [address] [place], [place], [address] Phone: [phone]   Fax:  [phone]  Name: [name] MRN: [id number] Date of Birth: 12/18/1945

## 2020-04-25 ENCOUNTER — Ambulatory Visit: Payer: Medicare Other | Admitting: Occupational Therapy

## 2020-04-25 ENCOUNTER — Other Ambulatory Visit: Payer: Self-pay

## 2020-04-25 DIAGNOSIS — I890 Lymphedema, not elsewhere classified: Secondary | ICD-10-CM

## 2020-04-25 NOTE — Therapy (Signed)
[place] MAIN [facility] SERVICES [address] [place], [place], [address] Phone: [phone]   Fax:  [phone]  Occupational Therapy Treatment  Patient Details  Name: [name] MRN: [id number] Date of Birth: [birth year] Referring Provider (OT): Juluis Pitch, MD   Encounter Date: 04/25/2020   OT End of Session - 04/25/20 1020    Visit Number 33    Number of Visits 36    Date for OT Re-Evaluation 05/09/20    OT Start Time 1005    Activity Tolerance Patient tolerated treatment well;No increased pain    Behavior During Therapy WFL for tasks assessed/performed           Past Medical History:  Diagnosis Date  . Anemia   . Anxiety   . Arthritis    "hands, feet" (03/03/2017)  . Brain lesion    2 types  . Cataract   . Cervical spondylosis with myelopathy   . Chest pain    Normal cardiac cath 5/09  . Chronic lower back pain   . CKD (chronic kidney disease), stage III    [name]  . Congestive heart failure (CHF) ([place])    has a Cardiomems implant   . Constipation   . Dementia ([place])   . Depression   . Dumping syndrome   . Dyspnea    with activity  . Edema   . Fatigue   . Fibromyalgia   . GERD (gastroesophageal reflux disease)   . Headache    "w/high CBG" (03/03/2017)  . History of echocardiogram    a. Echo 03/20/16 (done at [facility] in [place], [place]):  mild LVH, EF 14%, normal diastolic function, mild LAE, MAC, RVSP 25 mmHg  . Hyperlipidemia   . Hypertension   . Hypothyroidism   . Lumbar spondylosis   . Lymphedema    seeing specialist for this  . Migraine    "mostly stopped when I changed my diet" (03/03/2017)  . OSA on CPAP   . Pneumonia X 1  . PONV (postoperative nausea and vomiting)   . Restless leg syndrome   . Rheumatoid arthritis ([place])   . Stroke [facility])    TIAs "mini strokes"- unsure of last TIA - pt and daughter deny this  . Syncope   . Tremors of nervous system   . Type II diabetes  mellitus ([place])     Past Surgical History:  Procedure Laterality Date  . APPENDECTOMY  1966  . BACK SURGERY    . CARDIAC CATHETERIZATION N/A 05/25/2016   Procedure: Right/Left Heart Cath and Coronary Angiography;  Surgeon: Larey Dresser, MD;  Location: [place] [facility];  Service: Cardiovascular;  Laterality: N/A;  . CATARACT EXTRACTION W/ INTRAOCULAR LENS  IMPLANT, BILATERAL Bilateral   . COLONOSCOPY    . CORONARY ANGIOGRAM  2009   Normal coronaries  . EXCISION/RELEASE BURSA HIP Bilateral   . I & D KNEE WITH POLY EXCHANGE Left 03/02/2017   Procedure: Left knee Revision of poly liner;  Surgeon: Mcarthur Rossetti, MD;  Location: [place];  Service: Orthopedics;  Laterality: Left;  . JOINT REPLACEMENT    . KNEE ARTHROSCOPY Bilateral   . LAPAROSCOPIC CHOLECYSTECTOMY  2015  . LMF  2017   CardiMEMS HF implant for CHF (measures amount of fluid in the heart)  . LUMBAR DISC SURGERY    . LUMBAR LAMINECTOMY/DECOMPRESSION MICRODISCECTOMY Left 12/2005   L2-3 laminectomy and diskectomy/notes 01/06/2011  . POSTERIOR LUMBAR FUSION  04/2006   /  notes 01/06/2011; "put cages in"  . REVERSE SHOULDER ARTHROPLASTY Left 06/15/2017   Procedure: REVERSE LEFT SHOULDER ARTHROPLASTY;  Surgeon: Meredith Pel, MD;  Location: [place];  Service: Orthopedics;  Laterality: Left;  . REVERSE SHOULDER ARTHROPLASTY Right 02/01/2018  . REVERSE SHOULDER ARTHROPLASTY Right 02/01/2018   Procedure: RIGHT REVERSE SHOULDER ARTHROPLASTY;  Surgeon: Meredith Pel, MD;  Location: [place];  Service: Orthopedics;  Laterality: Right;  . REVISION TOTAL KNEE ARTHROPLASTY Left 03/02/2017   poly liner/notes 03/02/2017  . RIGHT HEART CATH N/A 11/13/2016   Procedure: Right Heart Cath with Cardiomems;  Surgeon: Larey Dresser, MD;  Location: [place] [facility];  Service: Cardiovascular;  Laterality: N/A;  . SHOULDER ARTHROSCOPY WITH ROTATOR CUFF REPAIR Right   . SHOULDER OPEN ROTATOR CUFF REPAIR Left 01/2011   [name] 01/29/2011  .  TOTAL ABDOMINAL HYSTERECTOMY    . TOTAL KNEE ARTHROPLASTY Left   . TOTAL KNEE ARTHROPLASTY Right 11/18/2012   Procedure: TOTAL KNEE ARTHROPLASTY;  Surgeon: Yvette Rack., MD;  Location: [place];  Service: Orthopedics;  Laterality: Right;  . TUBAL LIGATION    . UPPER GASTROINTESTINAL ENDOSCOPY    . UPPER GI ENDOSCOPY      There were no vitals filed for this visit.   Subjective Assessment - 04/25/20 1017    Subjective  [name] presents to OT for Rx visit 33/36 to address BLE lymphedema. Flexitouch trial rescheduled. Manufacturer's rep unwell today. Pt tells me she is seeing podiatrist after out session today to get her nails cut. Pt denies LE related leg pain this morning.    Pertinent History chronic BLE leg swelling and associated pain ~ 40 years; hx herniated intervertebral discL TKA; L total knee revision; HTN, Obesity. dementia; restless leg; CHF; unstable anina; R RC arthroplasty, OA, cervical spondylosis with myelopathy, chronic lower back pain, CKD (Stage?); Fibromyalgia; Hypothyroidism; OSA ( cPap?); hx pneumonia; RA, Type II Diabetes. S/P cardiac cathg, B THA, Lumbar laminectomy, posterior lumbar fusion, B shoulder arthroplasty; total abdominal hysterectomy    Limitations chronic leg swelling and associated pain, difficulty walking, impaired dynamic balance, muscle weakness,    Repetition Increases Symptoms    Special Tests + STEMMER sign base of toes bilaterally    Pain Onset --   s/p 45 yrs                       OT Treatments/Exercises (OP) - 04/25/20 0001      ADLs   ADL Education Given Yes      Manual Therapy   Manual Therapy Manual Lymphatic Drainage (MLD);Edema management;Compression Bandaging    Manual Lymphatic Drainage (MLD)  LLE MLD to LLE/LLQ utilizing modified "short neck sequence" with no strokes to neck in keeping w' LE thyroid precautions. UTILIZED DEEP ABDOMINAL PATHWAYS VIS DIAPHRAGMATIC BREATHING, FUNCTIONAL INGUINAL ln, AND PROGIDED DYNAMIC  STROKES TO LEG IN SEGMENTS FROM PROXIMAL TO DISTAL. eXCELLENT TOLERANCE.    Compression Bandaging L knee length multilayer gradient compression wraps as established.                   OT Education - 04/25/20 1019    Education Details Cont Pt edu re [place] care. Pt instructed to monitor RLE closely for recurrence of cellulitis due to sudden dramatic increase in unilateral swelling.    Person(s) Educated Patient    Methods Explanation;Demonstration;Handout    Comprehension Verbalized understanding;Returned demonstration;Need further instruction  OT Long Term Goals - 04/18/20 1126      OT LONG TERM GOAL #1   Title Pt will be able to apply LLE multi-layer, short stretch compression wraps daily with maximum caregiver assistance, using correct gradient techniques independently to return affected limb/s, as closely as possible, to premorbid size and shape, to limit infection risk, and to improve safe functional mobility and ADLs performance.    Baseline dependent    Time 4    Period Days    Status Achieved   Pt has no assistance at SNF for compression wrapping. Pt launders and rolls bandages independently. Pt still requires ongoing verbal cues to lift leg and reposition wheil assistant waraps. 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     OT LONG TERM GOAL #2   Title Pt will be able to verbalize signs and symptoms of cellulitis infection and identify 4 common lymphedema precautions using printed resource for reference (modified independence) to limit LE progression over time.    Baseline Max A    Time 4    Period Days    Status Achieved      OT LONG TERM GOAL #3   Title Pt will sustain than 85% compliance with all daily LE self-care home program components throughout Intensive Phase CDT, including impeccable skin care, lymphatic pumping therex,  compression wraps and simple self MLD, to ensure optimal limb volume reduction, to limit infection risk and to limit LE  progression.    Baseline dependent    Time 12    Period Weeks    Status Achieved   Pt has achieved ~ 75% compliance with LE self care home program. Pt unableto reapply wraps between visits if she has to remove them     .     OT LONG TERM GOAL #4   Title Pt to achieve at least 10% LLE limb volume reduction during Intensive Phase CDT with Max CG assistance for wrapping, to improve functional performance of basic and instrumental ADLs, and to limit LE progression.    Baseline dependent    Time 12    Period Weeks    Status Partially Met   Met for all LLE segments and for full leg. Reduction goal for overall reduction of LLE limb volume to 20%     OT LONG TERM GOAL #5   Title During self-management phase of CDT Pt will retain limb volume reductions achieved during Intensive Phase CDT with no more than 3% volume increase using all LE self care components  to limit LE progression and further functional decline.    Baseline dependent    Time 6    Period Months    Status On-going                 Plan - 04/25/20 1135    Clinical Impression Statement RLE redness and swelling below the knee is increased again today. Skin temperature is warmer today than last 2 days. These signs/symptoms are concerning for recurrence of cellulitis. Communicated concerns re elevated infection risk  in note to SNF. LLE does not present with signs/symptoms of cellulitis, so Cont w LLE MLD, skin care and gradient compression wraps below the knee to LLE. Pt demonstrates ongoing progress towards all OT goals for LE care. Cont as per OC.           Patient will benefit from skilled therapeutic intervention in order to improve the following deficits and impairments:  Visit Diagnosis: Lymphedema, not elsewhere classified    Problem List Patient Active Problem List   Diagnosis Date Noted  . Iron deficiency 08/06/2018  . History of total left knee replacement (TKR) 07/18/2018  . Rectal bleeding  07/05/2018  . Hemorrhoids 07/05/2018  . Anal itching 07/05/2018  . Rotator cuff arthropathy of right shoulder   . Shoulder arthritis 06/15/2017  . Status post revision of total knee replacement, left 04/29/2017  . Polyethylene liner wear following left total knee arthroplasty requiring isolated polyethylene liner exchange ([place]) 03/02/2017  . 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 Unstable angina ([place]) 05/21/2016  . Chronic diastolic heart failure ([place]) 03/29/2016  . Normal coronary arteries 2009 01/14/2015  . Obesity-BMI 40 01/14/2015  . Restless leg 01/14/2015  . Chest pain 01/14/2015  . Back pain 01/14/2015  . Renal insufficiency 01/14/2015  . Dementia- mild memory issues 01/14/2015  . Occult blood positive stool 12/14/2014  . Anemia 12/14/2014  . Family history of colon cancer 12/14/2014  . Change in bowel habits 12/14/2014  . GERD (gastroesophageal reflux disease) 12/14/2014  . Dyspnea 05/17/2012  . Lymphedema 05/17/2012  . Weight gain 05/17/2012  . HTN (hypertension) 05/17/2012   Andrey Spearman, MS, OTR/L, [facility] 04/25/20 11:40 AM  [place] MAIN [facility] SERVICES [address] [place], [place], [address] Phone: [phone]   Fax:  [phone]  Name: SENAYA DICENSO MRN: [id number] Date of Birth: [birth year]

## 2020-04-26 ENCOUNTER — Ambulatory Visit: Payer: Medicare Other | Admitting: Occupational Therapy

## 2020-04-30 ENCOUNTER — Other Ambulatory Visit: Payer: Self-pay

## 2020-04-30 ENCOUNTER — Ambulatory Visit: Payer: Medicare Other | Admitting: Occupational Therapy

## 2020-04-30 DIAGNOSIS — I890 Lymphedema, not elsewhere classified: Secondary | ICD-10-CM | POA: Diagnosis not present

## 2020-04-30 NOTE — Therapy (Signed)
[place] MAIN [facility] SERVICES [address] [place], [place], [address] Phone: [phone]   Fax:  [phone]  Occupational Therapy Treatment  Patient Details  Name: [name] MRN: [id number] Date of Birth: Apr 20, 1946 Referring Provider (OT): Juluis Pitch, MD   Encounter Date: 04/30/2020   OT End of Session - 04/30/20 1014    Visit Number 34    Number of Visits 36    Date for OT Re-Evaluation 05/09/20    OT Start Time 1001    OT Stop Time 1105    OT Time Calculation (min) 64 min    Activity Tolerance Patient tolerated treatment well;No increased pain    Behavior During Therapy WFL for tasks assessed/performed           Past Medical History:  Diagnosis Date  . Anemia   . Anxiety   . Arthritis    "hands, feet" (03/03/2017)  . Brain lesion    2 types  . Cataract   . Cervical spondylosis with myelopathy   . Chest pain    Normal cardiac cath 5/09  . Chronic lower back pain   . CKD (chronic kidney disease), stage III    [name]  . Congestive heart failure (CHF) ([place])    has a Cardiomems implant   . Constipation   . Dementia ([place])   . Depression   . Dumping syndrome   . Dyspnea    with activity  . Edema   . Fatigue   . Fibromyalgia   . GERD (gastroesophageal reflux disease)   . Headache    "w/high CBG" (03/03/2017)  . History of echocardiogram    a. Echo 03/20/16 (done at [facility] in [place], [place]):  mild LVH, EF 49%, normal diastolic function, mild LAE, MAC, RVSP 25 mmHg  . Hyperlipidemia   . Hypertension   . Hypothyroidism   . Lumbar spondylosis   . Lymphedema    seeing specialist for this  . Migraine    "mostly stopped when I changed my diet" (03/03/2017)  . OSA on CPAP   . Pneumonia X 1  . PONV (postoperative nausea and vomiting)   . Restless leg syndrome   . Rheumatoid arthritis ([place])   . Stroke [facility])    TIAs "mini strokes"- unsure of last TIA - pt and daughter deny this  .  Syncope   . Tremors of nervous system   . Type II diabetes mellitus ([place])     Past Surgical History:  Procedure Laterality Date  . APPENDECTOMY  1966  . BACK SURGERY    . CARDIAC CATHETERIZATION N/A 05/25/2016   Procedure: Right/Left Heart Cath and Coronary Angiography;  Surgeon: Larey Dresser, MD;  Location: [place] [facility];  Service: Cardiovascular;  Laterality: N/A;  . CATARACT EXTRACTION W/ INTRAOCULAR LENS  IMPLANT, BILATERAL Bilateral   . COLONOSCOPY    . CORONARY ANGIOGRAM  2009   Normal coronaries  . EXCISION/RELEASE BURSA HIP Bilateral   . I & D KNEE WITH POLY EXCHANGE Left 03/02/2017   Procedure: Left knee Revision of poly liner;  Surgeon: Mcarthur Rossetti, MD;  Location: [place];  Service: Orthopedics;  Laterality: Left;  . JOINT REPLACEMENT    . KNEE ARTHROSCOPY Bilateral   . LAPAROSCOPIC CHOLECYSTECTOMY  2015  . LMF  2017   CardiMEMS HF implant for CHF (measures amount of fluid in the heart)  . LUMBAR DISC SURGERY    . LUMBAR LAMINECTOMY/DECOMPRESSION MICRODISCECTOMY Left  12/2005   L2-3 laminectomy and diskectomy/notes 01/06/2011  . POSTERIOR LUMBAR FUSION  04/2006   [name] 01/06/2011; "put cages in"  . REVERSE SHOULDER ARTHROPLASTY Left 06/15/2017   Procedure: REVERSE LEFT SHOULDER ARTHROPLASTY;  Surgeon: Meredith Pel, MD;  Location: [place];  Service: Orthopedics;  Laterality: Left;  . REVERSE SHOULDER ARTHROPLASTY Right 02/01/2018  . REVERSE SHOULDER ARTHROPLASTY Right 02/01/2018   Procedure: RIGHT REVERSE SHOULDER ARTHROPLASTY;  Surgeon: Meredith Pel, MD;  Location: [place];  Service: Orthopedics;  Laterality: Right;  . REVISION TOTAL KNEE ARTHROPLASTY Left 03/02/2017   poly liner/notes 03/02/2017  . RIGHT HEART CATH N/A 11/13/2016   Procedure: Right Heart Cath with Cardiomems;  Surgeon: Larey Dresser, MD;  Location: [place] [facility];  Service: Cardiovascular;  Laterality: N/A;  . SHOULDER ARTHROSCOPY WITH ROTATOR CUFF REPAIR Right   . SHOULDER  OPEN ROTATOR CUFF REPAIR Left 01/2011   [name] 01/29/2011  . TOTAL ABDOMINAL HYSTERECTOMY    . TOTAL KNEE ARTHROPLASTY Left   . TOTAL KNEE ARTHROPLASTY Right 11/18/2012   Procedure: TOTAL KNEE ARTHROPLASTY;  Surgeon: Yvette Rack., MD;  Location: [place];  Service: Orthopedics;  Laterality: Right;  . TUBAL LIGATION    . UPPER GASTROINTESTINAL ENDOSCOPY    . UPPER GI ENDOSCOPY      There were no vitals filed for this visit.   Subjective Assessment - 04/30/20 1012    Subjective  [name] presents to OT for Rx visit 34/36 to address BLE lymphedema. Pt reports she left compression wraps in place since last visit. Pt encouraged not to leave wraps in place for more than 2 days due to infection risk.    Pertinent History chronic BLE leg swelling and associated pain ~ 40 years; hx herniated intervertebral discL TKA; L total knee revision; HTN, Obesity. dementia; restless leg; CHF; unstable anina; R RC arthroplasty, OA, cervical spondylosis with myelopathy, chronic lower back pain, CKD (Stage?); Fibromyalgia; Hypothyroidism; OSA ( cPap?); hx pneumonia; RA, Type II Diabetes. S/P cardiac cathg, B THA, Lumbar laminectomy, posterior lumbar fusion, B shoulder arthroplasty; total abdominal hysterectomy    Limitations chronic leg swelling and associated pain, difficulty walking, impaired dynamic balance, muscle weakness,    Repetition Increases Symptoms    Special Tests + STEMMER sign base of toes bilaterally    Pain Onset --   s/p 45 yrs                       OT Treatments/Exercises (OP) - 04/30/20 0001      ADLs   ADL Education Given Yes      Manual Therapy   Manual Therapy Manual Lymphatic Drainage (MLD);Edema management;Compression Bandaging    Manual Lymphatic Drainage (MLD)  MLD to RLE utilizing modified "short neck sequence" with no strokes to neck in keeping w' LE thyroid precautions. UTILIZED DEEP ABDOMINAL PATHWAYS VIS DIAPHRAGMATIC BREATHING, FUNCTIONAL INGUINAL ln, AND PROGIDED  DYNAMIC STROKES TO LEG IN SEGMENTS FROM PROXIMAL TO DISTAL. eXCELLENT TOLERANCE.    Compression Bandaging L knee length multilayer gradient compression wraps as established.                   OT Education - 04/30/20 1300    Education Details Cont Pt edu re [place] care. Pt instructed to monitor RLE closely for recurrence of cellulitis due to sudden dramatic increase in unilateral swelling.    Person(s) Educated Patient    Methods Explanation;Demonstration;Handout    Comprehension Verbalized understanding;Returned demonstration;Need further  instruction               OT Long Term Goals - 04/18/20 1126      OT LONG TERM GOAL #1   Title Pt will be able to apply LLE multi-layer, short stretch compression wraps daily with maximum caregiver assistance, using correct gradient techniques independently to return affected limb/s, as closely as possible, to premorbid size and shape, to limit infection risk, and to improve safe functional mobility and ADLs performance.    Baseline dependent    Time 4    Period Days    Status Achieved   Pt has no assistance at SNF for compression wrapping. Pt launders and rolls bandages independently. Pt still requires ongoing verbal cues to lift leg and reposition wheil assistant waraps. 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     OT LONG TERM GOAL #2   Title Pt will be able to verbalize signs and symptoms of cellulitis infection and identify 4 common lymphedema precautions using printed resource for reference (modified independence) to limit LE progression over time.    Baseline Max A    Time 4    Period Days    Status Achieved      OT LONG TERM GOAL #3   Title Pt will sustain than 85% compliance with all daily LE self-care home program components throughout Intensive Phase CDT, including impeccable skin care, lymphatic pumping therex,  compression wraps and simple self MLD, to ensure optimal limb volume reduction, to limit infection risk and to limit  LE progression.    Baseline dependent    Time 12    Period Weeks    Status Achieved   Pt has achieved ~ 75% compliance with LE self care home program. Pt unableto reapply wraps between visits if she has to remove them     .     OT LONG TERM GOAL #4   Title Pt to achieve at least 10% LLE limb volume reduction during Intensive Phase CDT with Max CG assistance for wrapping, to improve functional performance of basic and instrumental ADLs, and to limit LE progression.    Baseline dependent    Time 12    Period Weeks    Status Partially Met   Met for all LLE segments and for full leg. Reduction goal for overall reduction of LLE limb volume to 20%     OT LONG TERM GOAL #5   Title During self-management phase of CDT Pt will retain limb volume reductions achieved during Intensive Phase CDT with no more than 3% volume increase using all LE self care components  to limit LE progression and further functional decline.    Baseline dependent    Time 6    Period Months    Status On-going                 Plan - 04/30/20 1301    Clinical Impression Statement RLE continues to present with redness, peau de orange (distended pores), slight distal leg blistering / lymphatic cysts and slight warm. Provided RLE MLD today in effort to offload swelling . SNF notified that Pt may benefit from another course of antibiotic to stave off full blown recurrence of cellulitis. Pt tolerated treatment without increased pain or worsening symptoms. Cont as per POC. Fit custom L garment ASAP.           Patient will benefit from skilled therapeutic intervention in order to improve the following deficits and impairments:  Visit Diagnosis: Lymphedema, not elsewhere classified    Problem List Patient Active Problem List   Diagnosis Date Noted  . Iron deficiency 08/06/2018  . History of total left knee replacement (TKR) 07/18/2018  . Rectal bleeding 07/05/2018  . Hemorrhoids 07/05/2018  . Anal  itching 07/05/2018  . Rotator cuff arthropathy of right shoulder   . Shoulder arthritis 06/15/2017  . Status post revision of total knee replacement, left 04/29/2017  . Polyethylene liner wear following left total knee arthroplasty requiring isolated polyethylene liner exchange ([place]) 03/02/2017  . 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 Unstable angina ([place]) 05/21/2016  . Chronic diastolic heart failure ([place]) 03/29/2016  . Normal coronary arteries 2009 01/14/2015  . Obesity-BMI 40 01/14/2015  . Restless leg 01/14/2015  . Chest pain 01/14/2015  . Back pain 01/14/2015  . Renal insufficiency 01/14/2015  . Dementia- mild memory issues 01/14/2015  . Occult blood positive stool 12/14/2014  . Anemia 12/14/2014  . Family history of colon cancer 12/14/2014  . Change in bowel habits 12/14/2014  . GERD (gastroesophageal reflux disease) 12/14/2014  . Dyspnea 05/17/2012  . Lymphedema 05/17/2012  . Weight gain 05/17/2012  . HTN (hypertension) 05/17/2012   Andrey Spearman, MS, OTR/L, [facility] 04/30/20 1:05 PM   [place] MAIN [facility] SERVICES [address], [place], [address] Phone: [phone]   Fax:  [phone]  Name: [name] MRN: [id number] Date of Birth: 11-12-1945

## 2020-05-02 ENCOUNTER — Other Ambulatory Visit: Payer: Self-pay

## 2020-05-02 ENCOUNTER — Ambulatory Visit: Payer: Medicare Other | Admitting: Occupational Therapy

## 2020-05-02 DIAGNOSIS — I890 Lymphedema, not elsewhere classified: Secondary | ICD-10-CM

## 2020-05-02 NOTE — Therapy (Signed)
[place] MAIN [facility] SERVICES [address] [place], [place], [address] Phone: [phone]   Fax:  [phone]  Occupational Therapy Treatment  Patient Details  Name: [name] MRN: [id number] Date of Birth: 03-05-1946 Referring Provider (OT): Juluis Pitch, MD   Encounter Date: 05/02/2020   OT End of Session - 05/02/20 1216    Visit Number 35    Number of Visits 36    Date for OT Re-Evaluation 05/09/20    OT Start Time 1032    OT Stop Time 1105    OT Time Calculation (min) 33 min    Activity Tolerance Patient tolerated treatment well;No increased pain    Behavior During Therapy WFL for tasks assessed/performed           Past Medical History:  Diagnosis Date  . Anemia   . Anxiety   . Arthritis    "hands, feet" (03/03/2017)  . Brain lesion    2 types  . Cataract   . Cervical spondylosis with myelopathy   . Chest pain    Normal cardiac cath 5/09  . Chronic lower back pain   . CKD (chronic kidney disease), stage III    [name]  . Congestive heart failure (CHF) ([place])    has a Cardiomems implant   . Constipation   . Dementia ([place])   . Depression   . Dumping syndrome   . Dyspnea    with activity  . Edema   . Fatigue   . Fibromyalgia   . GERD (gastroesophageal reflux disease)   . Headache    "w/high CBG" (03/03/2017)  . History of echocardiogram    a. Echo 03/20/16 (done at [facility] in [place], [place]):  mild LVH, EF 54%, normal diastolic function, mild LAE, MAC, RVSP 25 mmHg  . Hyperlipidemia   . Hypertension   . Hypothyroidism   . Lumbar spondylosis   . Lymphedema    seeing specialist for this  . Migraine    "mostly stopped when I changed my diet" (03/03/2017)  . OSA on CPAP   . Pneumonia X 1  . PONV (postoperative nausea and vomiting)   . Restless leg syndrome   . Rheumatoid arthritis ([place])   . Stroke [facility])    TIAs "mini strokes"- unsure of last TIA - pt and daughter deny this  .  Syncope   . Tremors of nervous system   . Type II diabetes mellitus ([place])     Past Surgical History:  Procedure Laterality Date  . APPENDECTOMY  1966  . BACK SURGERY    . CARDIAC CATHETERIZATION N/A 05/25/2016   Procedure: Right/Left Heart Cath and Coronary Angiography;  Surgeon: Larey Dresser, MD;  Location: [place] [facility];  Service: Cardiovascular;  Laterality: N/A;  . CATARACT EXTRACTION W/ INTRAOCULAR LENS  IMPLANT, BILATERAL Bilateral   . COLONOSCOPY    . CORONARY ANGIOGRAM  2009   Normal coronaries  . EXCISION/RELEASE BURSA HIP Bilateral   . I & D KNEE WITH POLY EXCHANGE Left 03/02/2017   Procedure: Left knee Revision of poly liner;  Surgeon: Mcarthur Rossetti, MD;  Location: [place];  Service: Orthopedics;  Laterality: Left;  . JOINT REPLACEMENT    . KNEE ARTHROSCOPY Bilateral   . LAPAROSCOPIC CHOLECYSTECTOMY  2015  . LMF  2017   CardiMEMS HF implant for CHF (measures amount of fluid in the heart)  . LUMBAR DISC SURGERY    . LUMBAR LAMINECTOMY/DECOMPRESSION MICRODISCECTOMY Left  12/2005   L2-3 laminectomy and diskectomy/notes 01/06/2011  . POSTERIOR LUMBAR FUSION  04/2006   [name] 01/06/2011; "put cages in"  . REVERSE SHOULDER ARTHROPLASTY Left 06/15/2017   Procedure: REVERSE LEFT SHOULDER ARTHROPLASTY;  Surgeon: Meredith Pel, MD;  Location: [place];  Service: Orthopedics;  Laterality: Left;  . REVERSE SHOULDER ARTHROPLASTY Right 02/01/2018  . REVERSE SHOULDER ARTHROPLASTY Right 02/01/2018   Procedure: RIGHT REVERSE SHOULDER ARTHROPLASTY;  Surgeon: Meredith Pel, MD;  Location: [place];  Service: Orthopedics;  Laterality: Right;  . REVISION TOTAL KNEE ARTHROPLASTY Left 03/02/2017   poly liner/notes 03/02/2017  . RIGHT HEART CATH N/A 11/13/2016   Procedure: Right Heart Cath with Cardiomems;  Surgeon: Larey Dresser, MD;  Location: [place] [facility];  Service: Cardiovascular;  Laterality: N/A;  . SHOULDER ARTHROSCOPY WITH ROTATOR CUFF REPAIR Right   . SHOULDER  OPEN ROTATOR CUFF REPAIR Left 01/2011   [name] 01/29/2011  . TOTAL ABDOMINAL HYSTERECTOMY    . TOTAL KNEE ARTHROPLASTY Left   . TOTAL KNEE ARTHROPLASTY Right 11/18/2012   Procedure: TOTAL KNEE ARTHROPLASTY;  Surgeon: Yvette Rack., MD;  Location: [place];  Service: Orthopedics;  Laterality: Right;  . TUBAL LIGATION    . UPPER GASTROINTESTINAL ENDOSCOPY    . UPPER GI ENDOSCOPY      There were no vitals filed for this visit.   Subjective Assessment - 05/02/20 1217    Subjective  [name] presents to OT for Rx visit 35/36 to address BLE lymphedema. Pt is 30 minutes late for a 60 minute session due to transportation problems at SNF this morning. Pt is tearful and expresses frustration that her ride was so late today. Pt reports back pain during transfer , but does not rate numerically. Pt presents without wraps in place as usual, but brings supplies to clinic to appy after MLD. Manufacturer's rep from [employer] here for scheduled device trial of Flexitouch advanced sequential compression device ("pump"). Pt's LLE custom day and HOS garments are also here for fitting.                        OT Treatments/Exercises (OP) - 05/02/20 0001      ADLs   ADL Education Given Yes      Manual Therapy   Manual Therapy Edema management    Manual therapy comments HOS device fitting    Compression Bandaging Used HOS device after dsession today vs       wraps due to time constraints and by Pt request                  OT Education - 05/02/20 1221    Education Details Cont Pt edu re [place] care. Pt instructed to monitor RLE closely for recurrence of cellulitis due to sudden dramatic increase in unilateral swelling.    Person(s) Educated Patient    Methods Explanation;Demonstration;Handout;Tactile cues;Verbal cues    Comprehension Verbalized understanding;Returned demonstration;Need further instruction;Verbal cues required;Tactile cues required               OT Long Term  Goals - 04/18/20 1126      OT LONG TERM GOAL #1   Title Pt will be able to apply LLE multi-layer, short stretch compression wraps daily with maximum caregiver assistance, using correct gradient techniques independently to return affected limb/s, as closely as possible, to premorbid size and shape, to limit infection risk, and to improve safe functional mobility and ADLs performance.  Baseline dependent    Time 4    Period Days    Status Achieved   Pt has no assistance at SNF for compression wrapping. Pt launders and rolls bandages independently. Pt still requires ongoing verbal cues to lift leg and reposition wheil assistant waraps. 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     OT LONG TERM GOAL #2   Title Pt will be able to verbalize signs and symptoms of cellulitis infection and identify 4 common lymphedema precautions using printed resource for reference (modified independence) to limit LE progression over time.    Baseline Max A    Time 4    Period Days    Status Achieved      OT LONG TERM GOAL #3   Title Pt will sustain than 85% compliance with all daily LE self-care home program components throughout Intensive Phase CDT, including impeccable skin care, lymphatic pumping therex,  compression wraps and simple self MLD, to ensure optimal limb volume reduction, to limit infection risk and to limit LE progression.    Baseline dependent    Time 12    Period Weeks    Status Achieved   Pt has achieved ~ 75% compliance with LE self care home program. Pt unableto reapply wraps between visits if she has to remove them     .     OT LONG TERM GOAL #4   Title Pt to achieve at least 10% LLE limb volume reduction during Intensive Phase CDT with Max CG assistance for wrapping, to improve functional performance of basic and instrumental ADLs, and to limit LE progression.    Baseline dependent    Time 12    Period Weeks    Status Partially Met   Met for all LLE segments and for full leg. Reduction  goal for overall reduction of LLE limb volume to 20%     OT LONG TERM GOAL #5   Title During self-management phase of CDT Pt will retain limb volume reductions achieved during Intensive Phase CDT with no more than 3% volume increase using all LE self care components  to limit LE progression and further functional decline.    Baseline dependent    Time 6    Period Months    Status On-going                 Plan - 05/02/20 1224    Clinical Impression Statement Pt reports she commenced oral antibiotics for ongoing cellulitis infection. We were unable to complete device trial today due to time constraints. We rescheduled on a day when Pt is already sheduled for OT. We were unable to complete custom compression garment fitting due to time constraints. We'll complete that fitting at upcoming appointment. We did complete fitting for HOS device- [employer] RELAX A-D. Device fits well, but difficult to close zipper, most likely due to Pt being unwrapped and sitting in dependent position for several hours this morning before session. We'll attmpt to complete assessment at next session.           Patient will benefit from skilled therapeutic intervention in order to improve the following deficits and impairments:           Visit Diagnosis: Lymphedema, not elsewhere classified    Problem List Patient Active Problem List   Diagnosis Date Noted  . Iron deficiency 08/06/2018  . History of total left knee replacement (TKR) 07/18/2018  . Rectal bleeding 07/05/2018  . Hemorrhoids 07/05/2018  . Anal itching 07/05/2018  .  Rotator cuff arthropathy of right shoulder   . Shoulder arthritis 06/15/2017  . Status post revision of total knee replacement, left 04/29/2017  . Polyethylene liner wear following left total knee arthroplasty requiring isolated polyethylene liner exchange ([place]) 03/02/2017  . 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 Unstable angina ([place]) 05/21/2016   . Chronic diastolic heart failure ([place]) 03/29/2016  . Normal coronary arteries 2009 01/14/2015  . Obesity-BMI 40 01/14/2015  . Restless leg 01/14/2015  . Chest pain 01/14/2015  . Back pain 01/14/2015  . Renal insufficiency 01/14/2015  . Dementia- mild memory issues 01/14/2015  . Occult blood positive stool 12/14/2014  . Anemia 12/14/2014  . Family history of colon cancer 12/14/2014  . Change in bowel habits 12/14/2014  . GERD (gastroesophageal reflux disease) 12/14/2014  . Dyspnea 05/17/2012  . Lymphedema 05/17/2012  . Weight gain 05/17/2012  . HTN (hypertension) 05/17/2012    Andrey Spearman, MS, OTR/L, [facility] 05/02/20 12:28 PM   [place] MAIN [facility] SERVICES [address] [place], [place], [address] Phone: [phone]   Fax:  [phone]  Name: DAJAE KIZER MRN: [id number] Date of Birth: [birth year]

## 2020-05-03 ENCOUNTER — Ambulatory Visit: Payer: Medicare Other | Admitting: Occupational Therapy

## 2020-05-03 ENCOUNTER — Other Ambulatory Visit: Payer: Self-pay

## 2020-05-03 DIAGNOSIS — I890 Lymphedema, not elsewhere classified: Secondary | ICD-10-CM | POA: Diagnosis not present

## 2020-05-03 NOTE — Patient Instructions (Signed)
Do NOT sleep overnight in compression stockings.Don nighttime device for HOS at bedtime.  Wear compression stockings every day donning them each morning, first thing, and taking off at bedtime.  Do not wear nighttime device all day unless you are in bed all day. They will not contain lymphedema in a dependent posiition.  No compression or MLD for 48 hours after starting oral antibiotic of you have an infection.

## 2020-05-03 NOTE — Therapy (Signed)
[place] MAIN [facility] SERVICES [address] [place], [place], [address] Phone: [phone]   Fax:  [phone]  Occupational Therapy Treatment  Patient Details  Name: [name] MRN: [id number] Date of Birth: Dec 27, 1945 Referring Provider (OT): Juluis Pitch, MD   Encounter Date: 05/03/2020   OT End of Session - 05/03/20 1003    Visit Number 36    Number of Visits 36    Date for OT Re-Evaluation 08/01/20    OT Start Time 1003    OT Stop Time 1110    OT Time Calculation (min) 67 min           Past Medical History:  Diagnosis Date  . Anemia   . Anxiety   . Arthritis    "hands, feet" (03/03/2017)  . Brain lesion    2 types  . Cataract   . Cervical spondylosis with myelopathy   . Chest pain    Normal cardiac cath 5/09  . Chronic lower back pain   . CKD (chronic kidney disease), stage III    [name]  . Congestive heart failure (CHF) ([place])    has a Cardiomems implant   . Constipation   . Dementia ([place])   . Depression   . Dumping syndrome   . Dyspnea    with activity  . Edema   . Fatigue   . Fibromyalgia   . GERD (gastroesophageal reflux disease)   . Headache    "w/high CBG" (03/03/2017)  . History of echocardiogram    a. Echo 03/20/16 (done at [facility] in [place], [place]):  mild LVH, EF 70%, normal diastolic function, mild LAE, MAC, RVSP 25 mmHg  . Hyperlipidemia   . Hypertension   . Hypothyroidism   . Lumbar spondylosis   . Lymphedema    seeing specialist for this  . Migraine    "mostly stopped when I changed my diet" (03/03/2017)  . OSA on CPAP   . Pneumonia X 1  . PONV (postoperative nausea and vomiting)   . Restless leg syndrome   . Rheumatoid arthritis ([place])   . Stroke [facility])    TIAs "mini strokes"- unsure of last TIA - pt and daughter deny this  . Syncope   . Tremors of nervous system   . Type II diabetes mellitus ([place])     Past Surgical History:  Procedure Laterality Date  .  APPENDECTOMY  1966  . BACK SURGERY    . CARDIAC CATHETERIZATION N/A 05/25/2016   Procedure: Right/Left Heart Cath and Coronary Angiography;  Surgeon: Larey Dresser, MD;  Location: [place] [facility];  Service: Cardiovascular;  Laterality: N/A;  . CATARACT EXTRACTION W/ INTRAOCULAR LENS  IMPLANT, BILATERAL Bilateral   . COLONOSCOPY    . CORONARY ANGIOGRAM  2009   Normal coronaries  . EXCISION/RELEASE BURSA HIP Bilateral   . I & D KNEE WITH POLY EXCHANGE Left 03/02/2017   Procedure: Left knee Revision of poly liner;  Surgeon: Mcarthur Rossetti, MD;  Location: [place];  Service: Orthopedics;  Laterality: Left;  . JOINT REPLACEMENT    . KNEE ARTHROSCOPY Bilateral   . LAPAROSCOPIC CHOLECYSTECTOMY  2015  . LMF  2017   CardiMEMS HF implant for CHF (measures amount of fluid in the heart)  . LUMBAR DISC SURGERY    . LUMBAR LAMINECTOMY/DECOMPRESSION MICRODISCECTOMY Left 12/2005   L2-3 laminectomy and diskectomy/notes 01/06/2011  . POSTERIOR LUMBAR FUSION  04/2006   [name] 01/06/2011; "put cages  in"  . REVERSE SHOULDER ARTHROPLASTY Left 06/15/2017   Procedure: REVERSE LEFT SHOULDER ARTHROPLASTY;  Surgeon: Meredith Pel, MD;  Location: [place];  Service: Orthopedics;  Laterality: Left;  . REVERSE SHOULDER ARTHROPLASTY Right 02/01/2018  . REVERSE SHOULDER ARTHROPLASTY Right 02/01/2018   Procedure: RIGHT REVERSE SHOULDER ARTHROPLASTY;  Surgeon: Meredith Pel, MD;  Location: [place];  Service: Orthopedics;  Laterality: Right;  . REVISION TOTAL KNEE ARTHROPLASTY Left 03/02/2017   poly liner/notes 03/02/2017  . RIGHT HEART CATH N/A 11/13/2016   Procedure: Right Heart Cath with Cardiomems;  Surgeon: Larey Dresser, MD;  Location: [place] [facility];  Service: Cardiovascular;  Laterality: N/A;  . SHOULDER ARTHROSCOPY WITH ROTATOR CUFF REPAIR Right   . SHOULDER OPEN ROTATOR CUFF REPAIR Left 01/2011   [name] 01/29/2011  . TOTAL ABDOMINAL HYSTERECTOMY    . TOTAL KNEE ARTHROPLASTY Left   . TOTAL  KNEE ARTHROPLASTY Right 11/18/2012   Procedure: TOTAL KNEE ARTHROPLASTY;  Surgeon: Yvette Rack., MD;  Location: [place];  Service: Orthopedics;  Laterality: Right;  . TUBAL LIGATION    . UPPER GASTROINTESTINAL ENDOSCOPY    . UPPER GI ENDOSCOPY      There were no vitals filed for this visit.   Subjective Assessment - 05/03/20 1117    Subjective  [name] presents to OT for Rx visit 36/36 to address BLE lymphedema. Pt reports she tolerated HOS compression device fitted last visit overnight without difficulty. Pt denies pain this morning.                        OT Treatments/Exercises (OP) - 05/03/20 0001      ADLs   ADL Education Given Yes      Manual Therapy   Manual Therapy Edema management    Manual therapy comments Fitted custom daytime garment                  OT Education - 05/03/20 1122    Education Details Skilled LE self care training for wear and care regimes for custom elastic compression garments fitted today. Provided  training for donning and doffing garments using assistive devices (friction gloves and nylon/tyvek sock).    Person(s) Educated Patient    Methods Explanation;Demonstration;Handout;Tactile cues;Verbal cues    Comprehension Verbalized understanding;Returned demonstration;Need further instruction;Verbal cues required;Tactile cues required               OT Long Term Goals - 04/18/20 1126      OT LONG TERM GOAL #1   Title Pt will be able to apply LLE multi-layer, short stretch compression wraps daily with maximum caregiver assistance, using correct gradient techniques independently to return affected limb/s, as closely as possible, to premorbid size and shape, to limit infection risk, and to improve safe functional mobility and ADLs performance.    Baseline dependent    Time 4    Period Days    Status Achieved   Pt has no assistance at SNF for compression wrapping. Pt launders and rolls bandages independently. Pt still requires  ongoing verbal cues to lift leg and reposition wheil assistant waraps. 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     OT LONG TERM GOAL #2   Title Pt will be able to verbalize signs and symptoms of cellulitis infection and identify 4 common lymphedema precautions using printed resource for reference (modified independence) to limit LE progression over time.    Baseline Max A    Time  4    Period Days    Status Achieved      OT LONG TERM GOAL #3   Title Pt will sustain than 85% compliance with all daily LE self-care home program components throughout Intensive Phase CDT, including impeccable skin care, lymphatic pumping therex,  compression wraps and simple self MLD, to ensure optimal limb volume reduction, to limit infection risk and to limit LE progression.    Baseline dependent    Time 12    Period Weeks    Status Achieved   Pt has achieved ~ 75% compliance with LE self care home program. Pt unableto reapply wraps between visits if she has to remove them     .     OT LONG TERM GOAL #4   Title Pt to achieve at least 10% LLE limb volume reduction during Intensive Phase CDT with Max CG assistance for wrapping, to improve functional performance of basic and instrumental ADLs, and to limit LE progression.    Baseline dependent    Time 12    Period Weeks    Status Partially Met   Met for all LLE segments and for full leg. Reduction goal for overall reduction of LLE limb volume to 20%     OT LONG TERM GOAL #5   Title During self-management phase of CDT Pt will retain limb volume reductions achieved during Intensive Phase CDT with no more than 3% volume increase using all LE self care components  to limit LE progression and further functional decline.    Baseline dependent    Time 6    Period Months    Status On-going                 Plan - 05/03/20 1123    Clinical Impression Statement Initial fitting for LLE custom compression stocking. Garment appears initiallt to be provide  excellent fit. Pt will wear over the visit interval and we'll complete assessment when she returns. Refer to Instructions for precautions and wrear regime. Pt wil launder her garments/ devices herself. Pt unable to don/ doff stockings and device without assistance. Garments are to be donned daily with Max assist in the morning and removed at bedtime. Pt needs max A to don HOS devices for bedtime. Commence CDT to RLE at next visit.    Recommended Other Services Daytime: 2 Pr. Jobst EVAREX custom, ccl 2, A-D, 2.5 cm SB at oblique top edge, open toe, t heel.  HOS: Jobst RELAX custom ccl 2 A-D, OT w. posterior zipper           Patient will benefit from skilled therapeutic intervention in order to improve the following deficits and impairments:           Visit Diagnosis: Lymphedema, not elsewhere classified - Plan: Ot plan of care cert/re-cert    Problem List Patient Active Problem List   Diagnosis Date Noted  . Iron deficiency 08/06/2018  . History of total left knee replacement (TKR) 07/18/2018  . Rectal bleeding 07/05/2018  . Hemorrhoids 07/05/2018  . Anal itching 07/05/2018  . Rotator cuff arthropathy of right shoulder   . Shoulder arthritis 06/15/2017  . Status post revision of total knee replacement, left 04/29/2017  . Polyethylene liner wear following left total knee arthroplasty requiring isolated polyethylene liner exchange ([place]) 03/02/2017  . 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 Unstable angina ([place]) 05/21/2016  . Chronic diastolic heart failure ([place]) 03/29/2016  . Normal coronary arteries 2009 01/14/2015  .  Obesity-BMI 40 01/14/2015  . Restless leg 01/14/2015  . Chest pain 01/14/2015  . Back pain 01/14/2015  . Renal insufficiency 01/14/2015  . Dementia- mild memory issues 01/14/2015  . Occult blood positive stool 12/14/2014  . Anemia 12/14/2014  . Family history of colon cancer 12/14/2014  . Change in bowel habits 12/14/2014  . GERD  (gastroesophageal reflux disease) 12/14/2014  . Dyspnea 05/17/2012  . Lymphedema 05/17/2012  . Weight gain 05/17/2012  . HTN (hypertension) 05/17/2012    Andrey Spearman, MS, OTR/L, [facility] 05/03/20 11:30 AM  [place] MAIN [facility] SERVICES [address] [place], [place], [address] Phone: [phone]   Fax:  [phone]  Name: [name] MRN: [id number] Date of Birth: [birth year]

## 2020-05-07 ENCOUNTER — Other Ambulatory Visit: Payer: Self-pay

## 2020-05-07 ENCOUNTER — Ambulatory Visit: Payer: Medicare Other | Admitting: Occupational Therapy

## 2020-05-07 DIAGNOSIS — I890 Lymphedema, not elsewhere classified: Secondary | ICD-10-CM

## 2020-05-07 NOTE — Therapy (Signed)
[place] MAIN [facility] SERVICES [address] [place], [place], [address] Phone: [phone]   Fax:  [phone]  Occupational Therapy Treatment  Patient Details  Name: [name] MRN: [id number] Date of Birth: 19-Oct-1945 Referring Provider (OT): Juluis Pitch, MD   Encounter Date: 05/07/2020   OT End of Session - 05/07/20 1346    Visit Number 37    Number of Visits 72    Date for OT Re-Evaluation 08/01/20           Past Medical History:  Diagnosis Date  . Anemia   . Anxiety   . Arthritis    "hands, feet" (03/03/2017)  . Brain lesion    2 types  . Cataract   . Cervical spondylosis with myelopathy   . Chest pain    Normal cardiac cath 5/09  . Chronic lower back pain   . CKD (chronic kidney disease), stage III    [name]  . Congestive heart failure (CHF) ([place])    has a Cardiomems implant   . Constipation   . Dementia ([place])   . Depression   . Dumping syndrome   . Dyspnea    with activity  . Edema   . Fatigue   . Fibromyalgia   . GERD (gastroesophageal reflux disease)   . Headache    "w/high CBG" (03/03/2017)  . History of echocardiogram    a. Echo 03/20/16 (done at [facility] in [place], [place]):  mild LVH, EF 66%, normal diastolic function, mild LAE, MAC, RVSP 25 mmHg  . Hyperlipidemia   . Hypertension   . Hypothyroidism   . Lumbar spondylosis   . Lymphedema    seeing specialist for this  . Migraine    "mostly stopped when I changed my diet" (03/03/2017)  . OSA on CPAP   . Pneumonia X 1  . PONV (postoperative nausea and vomiting)   . Restless leg syndrome   . Rheumatoid arthritis ([place])   . Stroke [facility])    TIAs "mini strokes"- unsure of last TIA - pt and daughter deny this  . Syncope   . Tremors of nervous system   . Type II diabetes mellitus ([place])     Past Surgical History:  Procedure Laterality Date  . APPENDECTOMY  1966  . BACK SURGERY    . CARDIAC CATHETERIZATION N/A 05/25/2016    Procedure: Right/Left Heart Cath and Coronary Angiography;  Surgeon: Larey Dresser, MD;  Location: [place] [facility];  Service: Cardiovascular;  Laterality: N/A;  . CATARACT EXTRACTION W/ INTRAOCULAR LENS  IMPLANT, BILATERAL Bilateral   . COLONOSCOPY    . CORONARY ANGIOGRAM  2009   Normal coronaries  . EXCISION/RELEASE BURSA HIP Bilateral   . I & D KNEE WITH POLY EXCHANGE Left 03/02/2017   Procedure: Left knee Revision of poly liner;  Surgeon: Mcarthur Rossetti, MD;  Location: [place];  Service: Orthopedics;  Laterality: Left;  . JOINT REPLACEMENT    . KNEE ARTHROSCOPY Bilateral   . LAPAROSCOPIC CHOLECYSTECTOMY  2015  . LMF  2017   CardiMEMS HF implant for CHF (measures amount of fluid in the heart)  . LUMBAR DISC SURGERY    . LUMBAR LAMINECTOMY/DECOMPRESSION MICRODISCECTOMY Left 12/2005   L2-3 laminectomy and diskectomy/notes 01/06/2011  . POSTERIOR LUMBAR FUSION  04/2006   [name] 01/06/2011; "put cages in"  . REVERSE SHOULDER ARTHROPLASTY Left 06/15/2017   Procedure: REVERSE LEFT SHOULDER ARTHROPLASTY;  Surgeon: Meredith Pel, MD;  Location:  [place] OR;  Service: Orthopedics;  Laterality: Left;  . REVERSE SHOULDER ARTHROPLASTY Right 02/01/2018  . REVERSE SHOULDER ARTHROPLASTY Right 02/01/2018   Procedure: RIGHT REVERSE SHOULDER ARTHROPLASTY;  Surgeon: Meredith Pel, MD;  Location: [place];  Service: Orthopedics;  Laterality: Right;  . REVISION TOTAL KNEE ARTHROPLASTY Left 03/02/2017   poly liner/notes 03/02/2017  . RIGHT HEART CATH N/A 11/13/2016   Procedure: Right Heart Cath with Cardiomems;  Surgeon: Larey Dresser, MD;  Location: [place] [facility];  Service: Cardiovascular;  Laterality: N/A;  . SHOULDER ARTHROSCOPY WITH ROTATOR CUFF REPAIR Right   . SHOULDER OPEN ROTATOR CUFF REPAIR Left 01/2011   [name] 01/29/2011  . TOTAL ABDOMINAL HYSTERECTOMY    . TOTAL KNEE ARTHROPLASTY Left   . TOTAL KNEE ARTHROPLASTY Right 11/18/2012   Procedure: TOTAL KNEE ARTHROPLASTY;  Surgeon: Yvette Rack., MD;  Location: [place];  Service: Orthopedics;  Laterality: Right;  . TUBAL LIGATION    . UPPER GASTROINTESTINAL ENDOSCOPY    . UPPER GI ENDOSCOPY      There were no vitals filed for this visit.   Subjective Assessment - 05/07/20 1339    Subjective  Mrs Kitko presents to OT for Rx visit 37/72 to address BLE lymphedema. Pt unable to transfer from clinic toilet to wc using grab bars, so we spent 40 minutes of 60 minute session on transfer w/ Max A x 3. Consequently Rx session time was abbreviated due to time constraints.                        OT Treatments/Exercises (OP) - 05/07/20 0001      ADLs   ADL Education Given Yes      Manual Therapy   Manual Therapy Edema management;Compression Bandaging    Compression Bandaging Commenced RLE  gradient compression wraps to RLE as established for LLE                  OT Education - 05/07/20 1344    Education Details Proviided additional Pt edu for custom compression garment fitting to ensure all garment landmarks are  positioned  correctly    Person(s) Educated Patient    Methods Explanation;Demonstration;Tactile cues;Verbal cues    Comprehension Verbalized understanding;Returned demonstration;Need further instruction;Verbal cues required;Tactile cues required               OT Long Term Goals - 04/18/20 1126      OT LONG TERM GOAL #1   Title Pt will be able to apply LLE multi-layer, short stretch compression wraps daily with maximum caregiver assistance, using correct gradient techniques independently to return affected limb/s, as closely as possible, to premorbid size and shape, to limit infection risk, and to improve safe functional mobility and ADLs performance.    Baseline dependent    Time 4    Period Days    Status Achieved   Pt has no assistance at SNF for compression wrapping. Pt launders and rolls bandages independently. Pt still requires ongoing verbal cues to lift leg and reposition  wheil assistant waraps. 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     OT LONG TERM GOAL #2   Title Pt will be able to verbalize signs and symptoms of cellulitis infection and identify 4 common lymphedema precautions using printed resource for reference (modified independence) to limit LE progression over time.    Baseline Max A    Time 4    Period Days  Status Achieved      OT LONG TERM GOAL #3   Title Pt will sustain than 85% compliance with all daily LE self-care home program components throughout Intensive Phase CDT, including impeccable skin care, lymphatic pumping therex,  compression wraps and simple self MLD, to ensure optimal limb volume reduction, to limit infection risk and to limit LE progression.    Baseline dependent    Time 12    Period Weeks    Status Achieved   Pt has achieved ~ 75% compliance with LE self care home program. Pt unableto reapply wraps between visits if she has to remove them     .     OT LONG TERM GOAL #4   Title Pt to achieve at least 10% LLE limb volume reduction during Intensive Phase CDT with Max CG assistance for wrapping, to improve functional performance of basic and instrumental ADLs, and to limit LE progression.    Baseline dependent    Time 12    Period Weeks    Status Partially Met   Met for all LLE segments and for full leg. Reduction goal for overall reduction of LLE limb volume to 20%     OT LONG TERM GOAL #5   Title During self-management phase of CDT Pt will retain limb volume reductions achieved during Intensive Phase CDT with no more than 3% volume increase using all LE self care components  to limit LE progression and further functional decline.    Baseline dependent    Time 6    Period Months    Status On-going                 Plan - 05/07/20 1346    Clinical Impression Statement Pt arrived with compression garment not quite positioned correctly so bunching is observed in ankle crease. Pt  unable to comple toilet transfer  back to wheelchair in clinic restroom, so 40 minutes of our session was devited to providing Max A x 3 to complete the transfer. Applied compression wraps in gradient configuration established previously to RLE today. Pt denied discomfort in compression wraps. Next visit when we have a full hour we'll check compression garment measurements. We'll definitely ask for a remake with wider silicone top band to assist with downward sliding from top edge.    Recommended Other Services Daytime: 2 Pr. Jobst EVAREX custom, ccl 2, A-D, 2.5 cm SB at oblique top edge, open toe, t heel.  HOS: Jobst RELAX custom ccl 2 A-D, OT w. posterior zipper           Patient will benefit from skilled therapeutic intervention in order to improve the following deficits and impairments:           Visit Diagnosis: Lymphedema, not elsewhere classified    Problem List Patient Active Problem List   Diagnosis Date Noted  . Iron deficiency 08/06/2018  . History of total left knee replacement (TKR) 07/18/2018  . Rectal bleeding 07/05/2018  . Hemorrhoids 07/05/2018  . Anal itching 07/05/2018  . Rotator cuff arthropathy of right shoulder   . Shoulder arthritis 06/15/2017  . Status post revision of total knee replacement, left 04/29/2017  . Polyethylene liner wear following left total knee arthroplasty requiring isolated polyethylene liner exchange ([place]) 03/02/2017  . 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 Unstable angina ([place]) 05/21/2016  . Chronic diastolic heart failure ([place]) 03/29/2016  . Normal coronary arteries 2009 01/14/2015  . Obesity-BMI 40 01/14/2015  . Restless  leg 01/14/2015  . Chest pain 01/14/2015  . Back pain 01/14/2015  . Renal insufficiency 01/14/2015  . Dementia- mild memory issues 01/14/2015  . Occult blood positive stool 12/14/2014  . Anemia 12/14/2014  . Family history of colon cancer 12/14/2014  . Change in bowel habits 12/14/2014  . GERD (gastroesophageal  reflux disease) 12/14/2014  . Dyspnea 05/17/2012  . Lymphedema 05/17/2012  . Weight gain 05/17/2012  . HTN (hypertension) 05/17/2012    Andrey Spearman, MS, OTR/L, [facility] 05/07/20 1:51 PM  [place] MAIN [facility] SERVICES [address] [place], [place], [address] Phone: [phone]   Fax:  [phone]  Name: [name] MRN: [id number] Date of Birth: 10-07-1945

## 2020-05-09 ENCOUNTER — Other Ambulatory Visit: Payer: Self-pay

## 2020-05-09 ENCOUNTER — Ambulatory Visit: Payer: Medicare Other | Admitting: Occupational Therapy

## 2020-05-09 DIAGNOSIS — I890 Lymphedema, not elsewhere classified: Secondary | ICD-10-CM

## 2020-05-09 NOTE — Therapy (Signed)
[place] MAIN [facility] SERVICES [address] [place], [place], [address] Phone: [phone]   Fax:  [phone]  Occupational Therapy Treatment  Patient Details  Name: [name] MRN: [id number] Date of Birth: 1946/07/27 Referring Provider (OT): Juluis Pitch, MD   Encounter Date: 05/09/2020   OT End of Session - 05/09/20 1053    Visit Number 38    Number of Visits 14    Date for OT Re-Evaluation 08/01/20           Past Medical History:  Diagnosis Date  . Anemia   . Anxiety   . Arthritis    "hands, feet" (03/03/2017)  . Brain lesion    2 types  . Cataract   . Cervical spondylosis with myelopathy   . Chest pain    Normal cardiac cath 5/09  . Chronic lower back pain   . CKD (chronic kidney disease), stage III    [name]  . Congestive heart failure (CHF) ([place])    has a Cardiomems implant   . Constipation   . Dementia ([place])   . Depression   . Dumping syndrome   . Dyspnea    with activity  . Edema   . Fatigue   . Fibromyalgia   . GERD (gastroesophageal reflux disease)   . Headache    "w/high CBG" (03/03/2017)  . History of echocardiogram    a. Echo 03/20/16 (done at [facility] in [place], [place]):  mild LVH, EF 01%, normal diastolic function, mild LAE, MAC, RVSP 25 mmHg  . Hyperlipidemia   . Hypertension   . Hypothyroidism   . Lumbar spondylosis   . Lymphedema    seeing specialist for this  . Migraine    "mostly stopped when I changed my diet" (03/03/2017)  . OSA on CPAP   . Pneumonia X 1  . PONV (postoperative nausea and vomiting)   . Restless leg syndrome   . Rheumatoid arthritis ([place])   . Stroke [facility])    TIAs "mini strokes"- unsure of last TIA - pt and daughter deny this  . Syncope   . Tremors of nervous system   . Type II diabetes mellitus ([place])     Past Surgical History:  Procedure Laterality Date  . APPENDECTOMY  1966  . BACK SURGERY    . CARDIAC CATHETERIZATION N/A 05/25/2016    Procedure: Right/Left Heart Cath and Coronary Angiography;  Surgeon: Larey Dresser, MD;  Location: [place] [facility];  Service: Cardiovascular;  Laterality: N/A;  . CATARACT EXTRACTION W/ INTRAOCULAR LENS  IMPLANT, BILATERAL Bilateral   . COLONOSCOPY    . CORONARY ANGIOGRAM  2009   Normal coronaries  . EXCISION/RELEASE BURSA HIP Bilateral   . I & D KNEE WITH POLY EXCHANGE Left 03/02/2017   Procedure: Left knee Revision of poly liner;  Surgeon: Mcarthur Rossetti, MD;  Location: [place];  Service: Orthopedics;  Laterality: Left;  . JOINT REPLACEMENT    . KNEE ARTHROSCOPY Bilateral   . LAPAROSCOPIC CHOLECYSTECTOMY  2015  . LMF  2017   CardiMEMS HF implant for CHF (measures amount of fluid in the heart)  . LUMBAR DISC SURGERY    . LUMBAR LAMINECTOMY/DECOMPRESSION MICRODISCECTOMY Left 12/2005   L2-3 laminectomy and diskectomy/notes 01/06/2011  . POSTERIOR LUMBAR FUSION  04/2006   [name] 01/06/2011; "put cages in"  . REVERSE SHOULDER ARTHROPLASTY Left 06/15/2017   Procedure: REVERSE LEFT SHOULDER ARTHROPLASTY;  Surgeon: Meredith Pel, MD;  Location:  [place] OR;  Service: Orthopedics;  Laterality: Left;  . REVERSE SHOULDER ARTHROPLASTY Right 02/01/2018  . REVERSE SHOULDER ARTHROPLASTY Right 02/01/2018   Procedure: RIGHT REVERSE SHOULDER ARTHROPLASTY;  Surgeon: Meredith Pel, MD;  Location: [place];  Service: Orthopedics;  Laterality: Right;  . REVISION TOTAL KNEE ARTHROPLASTY Left 03/02/2017   poly liner/notes 03/02/2017  . RIGHT HEART CATH N/A 11/13/2016   Procedure: Right Heart Cath with Cardiomems;  Surgeon: Larey Dresser, MD;  Location: [place] [facility];  Service: Cardiovascular;  Laterality: N/A;  . SHOULDER ARTHROSCOPY WITH ROTATOR CUFF REPAIR Right   . SHOULDER OPEN ROTATOR CUFF REPAIR Left 01/2011   [name] 01/29/2011  . TOTAL ABDOMINAL HYSTERECTOMY    . TOTAL KNEE ARTHROPLASTY Left   . TOTAL KNEE ARTHROPLASTY Right 11/18/2012   Procedure: TOTAL KNEE ARTHROPLASTY;  Surgeon: Yvette Rack., MD;  Location: [place];  Service: Orthopedics;  Laterality: Right;  . TUBAL LIGATION    . UPPER GASTROINTESTINAL ENDOSCOPY    . UPPER GI ENDOSCOPY      There were no vitals filed for this visit.   Subjective Assessment - 05/09/20 1025    Subjective  [name] presents to OT for Rx visit 38/72 to address BLE lipo-lymphedema (I89.0). [employer] rep is here to assist with trials of the basic Lyndal Pulley' sequential pneumatic compression device (C5885) and the advanced Flexitouch device [phone]).    Pertinent History chronic BLE leg swelling and associated pain ~ 40 years; hx herniated intervertebral discL TKA; L total knee revision; HTN, Obesity. dementia; restless leg; CHF; unstable anina; R RC arthroplasty, OA, cervical spondylosis with myelopathy, chronic lower back pain, CKD (Stage?); Fibromyalgia; Hypothyroidism; OSA ( cPap?); hx pneumonia; RA, Type II Diabetes. S/P cardiac cathg, B THA, Lumbar laminectomy, posterior lumbar fusion, B shoulder arthroplasty; total abdominal hysterectomy    Limitations chronic leg swelling and associated pain, difficulty walking, impaired dynamic balance, muscle weakness,    Repetition Increases Symptoms    Special Tests + STEMMER sign base of toes bilaterally    Currently in Pain? Yes    Pain Score --   Pt unable to rate numerically. "I can't put a number to it this morning."   Pain Location Leg    Pain Orientation Right;Left    Pain Descriptors / Indicators Aching;Tender;Pressure;Tightness;Heaviness;Restless;Tiring;Sore;Discomfort;Guarding    Pain Type Chronic pain    Aggravating Factors  standing , walking, dependent sitting, lifting legs to transfer    Pain Relieving Factors elevation, MLD, moving legs    Effect of Pain on Daily Activities limits functional ambulation, transfers and bed mobility, limits basic and instrumental ADLs, limits productive activities, leisure pursuits and social participation, limits body image                         OT Treatments/Exercises (OP) - 05/09/20 0001      ADLs   ADL Education Given Yes      Manual Therapy   Manual Therapy Edema management    Edema Management trial of [employer] basic and advanced sequential pneumatic compression devices./    Compression Bandaging no wraps today due to time constraints.                  OT Education - 05/09/20 1039    Education Details Pt education for advanced Flexitouch sequential pneumatic compression device ("pump"), including clinical rational, precautions and contraindications. Discussed differences in devices and clinical    Person(s) Educated Patient  Methods Explanation;Demonstration;Tactile cues;Verbal cues    Comprehension Verbalized understanding;Returned demonstration;Need further instruction;Verbal cues required;Tactile cues required               OT Long Term Goals - 04/18/20 1126      OT LONG TERM GOAL #1   Title Pt will be able to apply LLE multi-layer, short stretch compression wraps daily with maximum caregiver assistance, using correct gradient techniques independently to return affected limb/s, as closely as possible, to premorbid size and shape, to limit infection risk, and to improve safe functional mobility and ADLs performance.    Baseline dependent    Time 4    Period Days    Status Achieved   Pt has no assistance at SNF for compression wrapping. Pt launders and rolls bandages independently. Pt still requires ongoing verbal cues to lift leg and reposition wheil assistant waraps. 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     OT LONG TERM GOAL #2   Title Pt will be able to verbalize signs and symptoms of cellulitis infection and identify 4 common lymphedema precautions using printed resource for reference (modified independence) to limit LE progression over time.    Baseline Max A    Time 4    Period Days    Status Achieved      OT LONG TERM GOAL #3   Title Pt will sustain  than 85% compliance with all daily LE self-care home program components throughout Intensive Phase CDT, including impeccable skin care, lymphatic pumping therex,  compression wraps and simple self MLD, to ensure optimal limb volume reduction, to limit infection risk and to limit LE progression.    Baseline dependent    Time 12    Period Weeks    Status Achieved   Pt has achieved ~ 75% compliance with LE self care home program. Pt unableto reapply wraps between visits if she has to remove them     .     OT LONG TERM GOAL #4   Title Pt to achieve at least 10% LLE limb volume reduction during Intensive Phase CDT with Max CG assistance for wrapping, to improve functional performance of basic and instrumental ADLs, and to limit LE progression.    Baseline dependent    Time 12    Period Weeks    Status Partially Met   Met for all LLE segments and for full leg. Reduction goal for overall reduction of LLE limb volume to 20%     OT LONG TERM GOAL #5   Title During self-management phase of CDT Pt will retain limb volume reductions achieved during Intensive Phase CDT with no more than 3% volume increase using all LE self care components  to limit LE progression and further functional decline.    Baseline dependent    Time 6    Period Months    Status On-going                 Plan - 05/09/20 1053    Clinical Impression Statement Patient has used a [id number] basic sequential pneumatic compression device (pump") to BLE daily for approximately 5 years with significant symptoms of lipo-lymphedema remaining, including dense tissue fibrosis/ hyperplasia, truncal and buttock edema and chronic leg swelling. Pt has also had several episodes of cellulitis during this time. Pt commenced Occupational Therapy for Complete Decongestive Therapy (CDT) to BLE on 02/09/20. To date she has been compliant with compression, exercise and elevation.  In clinic today she trialed a [employer] basic  device, Entre whicth a  1.5 cm increase in thigh circumference. Pt trialed the advanced Flexitouch device. The advanced Flexitouch [phone] pneumatic compression device with BLE garments and truncal garment is medically necessary to assist the patient with daily lymphedema self-care over time at home. The advanced, 32-chamber Flexitouch is the only available sequential pneumatic compression device providing proximal to distal lymphatic fluid return via regional lymph nodes (LN) deep abdominal pathways and the thoracic duct to the heart. The basic pneumatic pump is not appropriate for this patient because it provides distal-to-proximal, retrograde massage mobilizing tissue fluid against back pressure which then abruptly ends before reaching regional LNs leaving dense, protein rich fluid distal to the lymph nodes at joints. The Flexitouch pneumatic compression device is medically necessary for long term LE self-management at home,  to help to limit progression of this chromic, incurable condition, and to limit  further functional decline 2/2 LE progression..    Occupational performance deficits (Please refer to evaluation for details): ADL's;IADL's;Rest and Sleep;Work;Leisure;Social Participation    Rehab Potential Good    Clinical Decision Making Multiple treatment options, significant modification of task necessary    Comorbidities Affecting Occupational Performance: Presence of comorbidities impacting occupational performance    Modification or Assistance to Complete Evaluation  Min-Moderate modification of tasks or assist with assess necessary to complete eval    OT Treatment/Interventions Self-care/ADL training;Therapeutic exercise;Functional Mobility Training;Manual Therapy;Energy conservation;Manual lymph drainage;Therapeutic activities;Coping strategies training;DME and/or AE instruction;Compression bandaging;Patient/family education    Plan Complete Deconhgestive Therapy (CDT) Intensive and Self -Management phases, consisting of  Manual lymphatic Drainage (MLD), skin care, ther ex and compression bandaging    OT Home Exercise Plan lymphatic pumping therex 2-3 x q day, in sequence, 10 reps bilaterally    Recommended Other Services Daytime: 2 Pr. Jobst EVAREX custom, ccl 2, A-D, 2.5 cm SB at oblique top edge, open toe, t heel.  HOS: Jobst RELAX custom ccl 2 A-D, OT w. posterior zipper           Patient will benefit from skilled therapeutic intervention in order to improve the following deficits and impairments:           Visit Diagnosis: Lymphedema, not elsewhere classified    Problem List Patient Active Problem List   Diagnosis Date Noted  . Iron deficiency 08/06/2018  . History of total left knee replacement (TKR) 07/18/2018  . Rectal bleeding 07/05/2018  . Hemorrhoids 07/05/2018  . Anal itching 07/05/2018  . Rotator cuff arthropathy of right shoulder   . Shoulder arthritis 06/15/2017  . Status post revision of total knee replacement, left 04/29/2017  . Polyethylene liner wear following left total knee arthroplasty requiring isolated polyethylene liner exchange ([place]) 03/02/2017  . 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 Unstable angina ([place]) 05/21/2016  . Chronic diastolic heart failure ([place]) 03/29/2016  . Normal coronary arteries 2009 01/14/2015  . Obesity-BMI 40 01/14/2015  . Restless leg 01/14/2015  . Chest pain 01/14/2015  . Back pain 01/14/2015  . Renal insufficiency 01/14/2015  . Dementia- mild memory issues 01/14/2015  . Occult blood positive stool 12/14/2014  . Anemia 12/14/2014  . Family history of colon cancer 12/14/2014  . Change in bowel habits 12/14/2014  . GERD (gastroesophageal reflux disease) 12/14/2014  . Dyspnea 05/17/2012  . Lymphedema 05/17/2012  . Weight gain 05/17/2012  . HTN (hypertension) 05/17/2012    Andrey Spearman, MS, OTR/L, [facility] 05/09/20 10:55 AM  [place] MAIN [facility] SERVICES [address]  [place], [place], [address] Phone: [phone]   Fax:  [phone]  Name: AVAIYAH STRUBEL MRN: [id number] Date of Birth: 08-Feb-1946

## 2020-05-10 ENCOUNTER — Ambulatory Visit: Payer: Medicare Other | Admitting: Occupational Therapy

## 2020-05-10 ENCOUNTER — Other Ambulatory Visit: Payer: Self-pay

## 2020-05-10 DIAGNOSIS — I890 Lymphedema, not elsewhere classified: Secondary | ICD-10-CM

## 2020-05-10 NOTE — Therapy (Signed)
[place] MAIN [facility] SERVICES [address] [place], [place], [address] Phone: [phone]   Fax:  [phone]  Occupational Therapy Treatment  Patient Details  Name: [name] MRN: [id number] Date of Birth: Oct 01, 1945 Referring Provider (OT): Juluis Pitch, MD   Encounter Date: 05/10/2020   OT End of Session - 05/10/20 1108    Visit Number 39    Number of Visits 10    Date for OT Re-Evaluation 08/01/20    OT Start Time 1005    OT Stop Time 1105    OT Time Calculation (min) 60 min           Past Medical History:  Diagnosis Date  . Anemia   . Anxiety   . Arthritis    "hands, feet" (03/03/2017)  . Brain lesion    2 types  . Cataract   . Cervical spondylosis with myelopathy   . Chest pain    Normal cardiac cath 5/09  . Chronic lower back pain   . CKD (chronic kidney disease), stage III    [name]  . Congestive heart failure (CHF) ([place])    has a Cardiomems implant   . Constipation   . Dementia ([place])   . Depression   . Dumping syndrome   . Dyspnea    with activity  . Edema   . Fatigue   . Fibromyalgia   . GERD (gastroesophageal reflux disease)   . Headache    "w/high CBG" (03/03/2017)  . History of echocardiogram    a. Echo 03/20/16 (done at [facility] in [place], [place]):  mild LVH, EF 56%, normal diastolic function, mild LAE, MAC, RVSP 25 mmHg  . Hyperlipidemia   . Hypertension   . Hypothyroidism   . Lumbar spondylosis   . Lymphedema    seeing specialist for this  . Migraine    "mostly stopped when I changed my diet" (03/03/2017)  . OSA on CPAP   . Pneumonia X 1  . PONV (postoperative nausea and vomiting)   . Restless leg syndrome   . Rheumatoid arthritis ([place])   . Stroke [facility]    TIAs "mini strokes"- unsure of last TIA - pt and daughter deny this  . Syncope   . Tremors of nervous system   . Type II diabetes mellitus ([place])     Past Surgical History:  Procedure Laterality Date  .  APPENDECTOMY  1966  . BACK SURGERY    . CARDIAC CATHETERIZATION N/A 05/25/2016   Procedure: Right/Left Heart Cath and Coronary Angiography;  Surgeon: Larey Dresser, MD;  Location: [place] [facility];  Service: Cardiovascular;  Laterality: N/A;  . CATARACT EXTRACTION W/ INTRAOCULAR LENS  IMPLANT, BILATERAL Bilateral   . COLONOSCOPY    . CORONARY ANGIOGRAM  2009   Normal coronaries  . EXCISION/RELEASE BURSA HIP Bilateral   . I & D KNEE WITH POLY EXCHANGE Left 03/02/2017   Procedure: Left knee Revision of poly liner;  Surgeon: Mcarthur Rossetti, MD;  Location: [place];  Service: Orthopedics;  Laterality: Left;  . JOINT REPLACEMENT    . KNEE ARTHROSCOPY Bilateral   . LAPAROSCOPIC CHOLECYSTECTOMY  2015  . LMF  2017   CardiMEMS HF implant for CHF (measures amount of fluid in the heart)  . LUMBAR DISC SURGERY    . LUMBAR LAMINECTOMY/DECOMPRESSION MICRODISCECTOMY Left 12/2005   L2-3 laminectomy and diskectomy/notes 01/06/2011  . POSTERIOR LUMBAR FUSION  04/2006   [name] 01/06/2011; "put cages  in"  . REVERSE SHOULDER ARTHROPLASTY Left 06/15/2017   Procedure: REVERSE LEFT SHOULDER ARTHROPLASTY;  Surgeon: Meredith Pel, MD;  Location: [place];  Service: Orthopedics;  Laterality: Left;  . REVERSE SHOULDER ARTHROPLASTY Right 02/01/2018  . REVERSE SHOULDER ARTHROPLASTY Right 02/01/2018   Procedure: RIGHT REVERSE SHOULDER ARTHROPLASTY;  Surgeon: Meredith Pel, MD;  Location: [place];  Service: Orthopedics;  Laterality: Right;  . REVISION TOTAL KNEE ARTHROPLASTY Left 03/02/2017   poly liner/notes 03/02/2017  . RIGHT HEART CATH N/A 11/13/2016   Procedure: Right Heart Cath with Cardiomems;  Surgeon: Larey Dresser, MD;  Location: [place] [facility];  Service: Cardiovascular;  Laterality: N/A;  . SHOULDER ARTHROSCOPY WITH ROTATOR CUFF REPAIR Right   . SHOULDER OPEN ROTATOR CUFF REPAIR Left 01/2011   [name] 01/29/2011  . TOTAL ABDOMINAL HYSTERECTOMY    . TOTAL KNEE ARTHROPLASTY Left   . TOTAL  KNEE ARTHROPLASTY Right 11/18/2012   Procedure: TOTAL KNEE ARTHROPLASTY;  Surgeon: Yvette Rack., MD;  Location: [place];  Service: Orthopedics;  Laterality: Right;  . TUBAL LIGATION    . UPPER GASTROINTESTINAL ENDOSCOPY    . UPPER GI ENDOSCOPY      There were no vitals filed for this visit.   Subjective Assessment - 05/10/20 1014    Subjective  [name] presents to OT for Rx visit 39/72 to address BLE lipo-lymphedema (I89.0). Pt denies LE-related pain. Pt reports Jobst RELAX device for HOS came off in the middle of the night. Pt would like to troubleshoot fit in clinic today.    Pertinent History chronic BLE leg swelling and associated pain ~ 40 years; hx herniated intervertebral discL TKA; L total knee revision; HTN, Obesity. dementia; restless leg; CHF; unstable anina; R RC arthroplasty, OA, cervical spondylosis with myelopathy, chronic lower back pain, CKD (Stage?); Fibromyalgia; Hypothyroidism; OSA ( cPap?); hx pneumonia; RA, Type II Diabetes. S/P cardiac cathg, B THA, Lumbar laminectomy, posterior lumbar fusion, B shoulder arthroplasty; total abdominal hysterectomy    Limitations chronic leg swelling and associated pain, difficulty walking, impaired dynamic balance, muscle weakness,    Repetition Increases Symptoms    Special Tests + STEMMER sign base of toes bilaterally    Currently in Pain? No/denies                        OT Treatments/Exercises (OP) - 05/10/20 0001      ADLs   ADL Education Given Yes      Manual Therapy   Manual Therapy Edema management;Compression Bandaging;Manual Lymphatic Drainage (MLD)    Manual Lymphatic Drainage (MLD)  MLD to RLE utilizing modified "short neck sequence" with no strokes to neck in keeping w' LE thyroid precautions. UTILIZED DEEP ABDOMINAL PATHWAYS VIS DIAPHRAGMATIC BREATHING, FUNCTIONAL INGUINAL ln, AND PROGIDED DYNAMIC STROKES TO LEG IN SEGMENTS FROM PROXIMAL TO DISTAL. eXCELLENT TOLERANCE.    Compression Bandaging RLE knee  length gradient wraps as established                  OT Education - 05/10/20 1108    Education Details Pt education for advanced Flexitouch sequential pneumatic compression device ("pump"), including clinical rational, precautions and contraindications. Discussed differences in devices and clinical    Person(s) Educated Patient    Methods Explanation;Demonstration;Tactile cues;Verbal cues    Comprehension Verbalized understanding;Returned demonstration;Need further instruction;Verbal cues required;Tactile cues required               OT Long Term Goals - 04/18/20 1126  OT LONG TERM GOAL #1   Title Pt will be able to apply LLE multi-layer, short stretch compression wraps daily with maximum caregiver assistance, using correct gradient techniques independently to return affected limb/s, as closely as possible, to premorbid size and shape, to limit infection risk, and to improve safe functional mobility and ADLs performance.    Baseline dependent    Time 4    Period Days    Status Achieved   Pt has no assistance at SNF for compression wrapping. Pt launders and rolls bandages independently. Pt still requires ongoing verbal cues to lift leg and reposition wheil assistant waraps. 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     OT LONG TERM GOAL #2   Title Pt will be able to verbalize signs and symptoms of cellulitis infection and identify 4 common lymphedema precautions using printed resource for reference (modified independence) to limit LE progression over time.    Baseline Max A    Time 4    Period Days    Status Achieved      OT LONG TERM GOAL #3   Title Pt will sustain than 85% compliance with all daily LE self-care home program components throughout Intensive Phase CDT, including impeccable skin care, lymphatic pumping therex,  compression wraps and simple self MLD, to ensure optimal limb volume reduction, to limit infection risk and to limit LE progression.    Baseline  dependent    Time 12    Period Weeks    Status Achieved   Pt has achieved ~ 75% compliance with LE self care home program. Pt unableto reapply wraps between visits if she has to remove them     .     OT LONG TERM GOAL #4   Title Pt to achieve at least 10% LLE limb volume reduction during Intensive Phase CDT with Max CG assistance for wrapping, to improve functional performance of basic and instrumental ADLs, and to limit LE progression.    Baseline dependent    Time 12    Period Weeks    Status Partially Met   Met for all LLE segments and for full leg. Reduction goal for overall reduction of LLE limb volume to 20%     OT LONG TERM GOAL #5   Title During self-management phase of CDT Pt will retain limb volume reductions achieved during Intensive Phase CDT with no more than 3% volume increase using all LE self care components  to limit LE progression and further functional decline.    Baseline dependent    Time 6    Period Months    Status On-going                 Plan - 05/10/20 1110    Clinical Impression Statement Pt tolerated MLD, skin care and compression wraps to RLE today withoiut difficulty. Pt managing LLE garment and HOS device independently with extra time.Cont as per POC.    Occupational performance deficits (Please refer to evaluation for details): ADL's;IADL's;Rest and Sleep;Work;Leisure;Social Participation    Rehab Potential Good    Clinical Decision Making Multiple treatment options, significant modification of task necessary    Comorbidities Affecting Occupational Performance: Presence of comorbidities impacting occupational performance    Modification or Assistance to Complete Evaluation  Min-Moderate modification of tasks or assist with assess necessary to complete eval    OT Treatment/Interventions Self-care/ADL training;Therapeutic exercise;Functional Mobility Training;Manual Therapy;Energy conservation;Manual lymph drainage;Therapeutic activities;Coping  strategies training;DME and/or AE instruction;Compression bandaging;Patient/family education    Plan  Complete Deconhgestive Therapy (CDT) Intensive and Self -Management phases, consisting of Manual lymphatic Drainage (MLD), skin care, ther ex and compression bandaging    OT Home Exercise Plan lymphatic pumping therex 2-3 x q day, in sequence, 10 reps bilaterally    Recommended Other Services Daytime: 2 Pr. Jobst EVAREX custom, ccl 2, A-D, 2.5 cm SB at oblique top edge, open toe, t heel.  HOS: Jobst RELAX custom ccl 2 A-D, OT w. posterior zipper           Patient will benefit from skilled therapeutic intervention in order to improve the following deficits and impairments:           Visit Diagnosis: Lymphedema, not elsewhere classified    Problem List Patient Active Problem List   Diagnosis Date Noted  . Iron deficiency 08/06/2018  . History of total left knee replacement (TKR) 07/18/2018  . Rectal bleeding 07/05/2018  . Hemorrhoids 07/05/2018  . Anal itching 07/05/2018  . Rotator cuff arthropathy of right shoulder   . Shoulder arthritis 06/15/2017  . Status post revision of total knee replacement, left 04/29/2017  . Polyethylene liner wear following left total knee arthroplasty requiring isolated polyethylene liner exchange ([place]) 03/02/2017  . 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 Unstable angina ([place]) 05/21/2016  . Chronic diastolic heart failure ([place]) 03/29/2016  . Normal coronary arteries 2009 01/14/2015  . Obesity-BMI 40 01/14/2015  . Restless leg 01/14/2015  . Chest pain 01/14/2015  . Back pain 01/14/2015  . Renal insufficiency 01/14/2015  . Dementia- mild memory issues 01/14/2015  . Occult blood positive stool 12/14/2014  . Anemia 12/14/2014  . Family history of colon cancer 12/14/2014  . Change in bowel habits 12/14/2014  . GERD (gastroesophageal reflux disease) 12/14/2014  . Dyspnea 05/17/2012  . Lymphedema 05/17/2012  . Weight  gain 05/17/2012  . HTN (hypertension) 05/17/2012    Andrey Spearman, MS, OTR/L, [facility] 05/10/20 11:11 AM  [place] MAIN [facility] SERVICES [address] [place], [place], [address] Phone: [phone]   Fax:  [phone]  Name: [name] MRN: [id number] Date of Birth: [birth year]

## 2020-05-13 ENCOUNTER — Other Ambulatory Visit: Payer: Self-pay

## 2020-05-13 ENCOUNTER — Ambulatory Visit: Payer: Medicare Other | Admitting: Occupational Therapy

## 2020-05-13 DIAGNOSIS — I890 Lymphedema, not elsewhere classified: Secondary | ICD-10-CM | POA: Diagnosis not present

## 2020-05-13 NOTE — Therapy (Signed)
[place] MAIN [facility] SERVICES [address] [place], [place], [address] Phone: [phone]   Fax:  [phone]  Occupational Therapy Treatment Note and Progress Report: Lymphedema Care  Patient Details  Name: [name] MRN: [id number] Date of Birth: 03-30-1946 Referring Provider (OT): Juluis Pitch, MD   Encounter Date: 05/13/2020   OT End of Session - 05/13/20 1430    Visit Number 40    Number of Visits 72    Date for OT Re-Evaluation 08/01/20    OT Start Time 1004    OT Stop Time 1130    OT Time Calculation (min) 86 min    Activity Tolerance Patient tolerated treatment well;No increased pain           Past Medical History:  Diagnosis Date  . Anemia   . Anxiety   . Arthritis    "hands, feet" (03/03/2017)  . Brain lesion    2 types  . Cataract   . Cervical spondylosis with myelopathy   . Chest pain    Normal cardiac cath 5/09  . Chronic lower back pain   . CKD (chronic kidney disease), stage III    [name]  . Congestive heart failure (CHF) ([name])    has a Cardiomems implant   . Constipation   . Dementia ([place])   . Depression   . Dumping syndrome   . Dyspnea    with activity  . Edema   . Fatigue   . Fibromyalgia   . GERD (gastroesophageal reflux disease)   . Headache    "w/high CBG" (03/03/2017)  . History of echocardiogram    a. Echo 03/20/16 (done at [facility] in [place], [place]):  mild LVH, EF 28%, normal diastolic function, mild LAE, MAC, RVSP 25 mmHg  . Hyperlipidemia   . Hypertension   . Hypothyroidism   . Lumbar spondylosis   . Lymphedema    seeing specialist for this  . Migraine    "mostly stopped when I changed my diet" (03/03/2017)  . OSA on CPAP   . Pneumonia X 1  . PONV (postoperative nausea and vomiting)   . Restless leg syndrome   . Rheumatoid arthritis ([place])   . Stroke [facility])    TIAs "mini strokes"- unsure of last TIA - pt and daughter deny this  . Syncope   . Tremors  of nervous system   . Type II diabetes mellitus ([place])     Past Surgical History:  Procedure Laterality Date  . APPENDECTOMY  1966  . BACK SURGERY    . CARDIAC CATHETERIZATION N/A 05/25/2016   Procedure: Right/Left Heart Cath and Coronary Angiography;  Surgeon: Larey Dresser, MD;  Location: [place] [facility];  Service: Cardiovascular;  Laterality: N/A;  . CATARACT EXTRACTION W/ INTRAOCULAR LENS  IMPLANT, BILATERAL Bilateral   . COLONOSCOPY    . CORONARY ANGIOGRAM  2009   Normal coronaries  . EXCISION/RELEASE BURSA HIP Bilateral   . I & D KNEE WITH POLY EXCHANGE Left 03/02/2017   Procedure: Left knee Revision of poly liner;  Surgeon: Mcarthur Rossetti, MD;  Location: [place];  Service: Orthopedics;  Laterality: Left;  . JOINT REPLACEMENT    . KNEE ARTHROSCOPY Bilateral   . LAPAROSCOPIC CHOLECYSTECTOMY  2015  . LMF  2017   CardiMEMS HF implant for CHF (measures amount of fluid in the heart)  . LUMBAR DISC SURGERY    . LUMBAR LAMINECTOMY/DECOMPRESSION MICRODISCECTOMY Left 12/2005   L2-3  laminectomy and diskectomy/notes 01/06/2011  . POSTERIOR LUMBAR FUSION  04/2006   [name] 01/06/2011; "put cages in"  . REVERSE SHOULDER ARTHROPLASTY Left 06/15/2017   Procedure: REVERSE LEFT SHOULDER ARTHROPLASTY;  Surgeon: Meredith Pel, MD;  Location: [place];  Service: Orthopedics;  Laterality: Left;  . REVERSE SHOULDER ARTHROPLASTY Right 02/01/2018  . REVERSE SHOULDER ARTHROPLASTY Right 02/01/2018   Procedure: RIGHT REVERSE SHOULDER ARTHROPLASTY;  Surgeon: Meredith Pel, MD;  Location: [place];  Service: Orthopedics;  Laterality: Right;  . REVISION TOTAL KNEE ARTHROPLASTY Left 03/02/2017   poly liner/notes 03/02/2017  . RIGHT HEART CATH N/A 11/13/2016   Procedure: Right Heart Cath with Cardiomems;  Surgeon: Larey Dresser, MD;  Location: [place] [facility];  Service: Cardiovascular;  Laterality: N/A;  . SHOULDER ARTHROSCOPY WITH ROTATOR CUFF REPAIR Right   . SHOULDER OPEN ROTATOR CUFF  REPAIR Left 01/2011   [name] 01/29/2011  . TOTAL ABDOMINAL HYSTERECTOMY    . TOTAL KNEE ARTHROPLASTY Left   . TOTAL KNEE ARTHROPLASTY Right 11/18/2012   Procedure: TOTAL KNEE ARTHROPLASTY;  Surgeon: Yvette Rack., MD;  Location: [place];  Service: Orthopedics;  Laterality: Right;  . TUBAL LIGATION    . UPPER GASTROINTESTINAL ENDOSCOPY    . UPPER GI ENDOSCOPY      There were no vitals filed for this visit.   Subjective Assessment - 05/13/20 1425    Subjective  [name] presents to OT for Rx visit 40/72 to address BLE lipo-lymphedema (I89.0). Pt denies LE-related pain. Pt reports Jobst RELAX device for HOS came off in the middle of the night. Pt would like to troubleshoot fit in clinic today.    Pertinent History chronic BLE leg swelling and associated pain ~ 40 years; hx herniated intervertebral discL TKA; L total knee revision; HTN, Obesity. dementia; restless leg; CHF; unstable anina; R RC arthroplasty, OA, cervical spondylosis with myelopathy, chronic lower back pain, CKD (Stage?); Fibromyalgia; Hypothyroidism; OSA ( cPap?); hx pneumonia; RA, Type II Diabetes. S/P cardiac cathg, B THA, Lumbar laminectomy, posterior lumbar fusion, B shoulder arthroplasty; total abdominal hysterectomy    Limitations chronic leg swelling and associated pain, difficulty walking, impaired dynamic balance, muscle weakness,    Repetition Increases Symptoms    Special Tests + STEMMER sign base of toes bilaterally                                     OT Long Term Goals - 05/13/20 1430      OT LONG TERM GOAL #1   Title Pt will be able to apply LLE multi-layer, short stretch compression wraps daily with maximum caregiver assistance, using correct gradient techniques independently to return affected limb/s, as closely as possible, to premorbid size and shape, to limit infection risk, and to improve safe functional mobility and ADLs performance.    Baseline dependent    Time 4    Period Days     Status Achieved   Pt has no assistance at SNF for compression wrapping. Pt launders and rolls bandages independently. Pt still requires ongoing verbal cues to lift leg and reposition wheil assistant waraps. 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     OT LONG TERM GOAL #2   Title Pt will be able to verbalize signs and symptoms of cellulitis infection and identify 4 common lymphedema precautions using printed resource for reference (modified independence) to limit LE progression over time.  Baseline Max A    Time 4    Period Days    Status Achieved      OT LONG TERM GOAL #3   Title Pt will sustain than 85% compliance with all daily LE self-care home program components throughout Intensive Phase CDT, including impeccable skin care, lymphatic pumping therex,  compression wraps and simple self MLD, to ensure optimal limb volume reduction, to limit infection risk and to limit LE progression.    Baseline dependent    Time 12    Period Weeks    Status Achieved   Pt has achieved ~ 75% compliance with LE self care home program. Pt unableto reapply wraps between visits if she has to remove them     .     OT LONG TERM GOAL #4   Title Pt to achieve at least 10% LLE limb volume reduction during Intensive Phase CDT with Max CG assistance for wrapping, to improve functional performance of basic and instrumental ADLs, and to limit LE progression.    Baseline dependent    Time 12    Period Weeks    Status Partially Met   Met for all LLE segments and for full leg. Reduction goal for overall reduction of LLE limb volume to 20%. Now that RLE cellulitis is under better control we commenced CDT toLLE. By visual assessment would estimate ~  10% reduction thus far.     OT LONG TERM GOAL #5   Title During self-management phase of CDT Pt will retain limb volume reductions achieved during Intensive Phase CDT with no more than 3% volume increase using all LE self care components  to limit LE progression and further  functional decline.    Baseline dependent    Time 6    Period Months    Status On-going                 Plan - 05/13/20 1436    Clinical Impression Statement RLE condition continues to improve with current course of antibiotics. Skin temperature is cool and swellinbg is reducing . Pt tolerated LLE MLD skin vcare and gradient wrapping today without  increased pain. Pt continues to demonstrate excellent progress towards all OT goals for LE care. She is as compliant with all daily LE self care protocols as possible without assistance at SNF. She is able to don LLE custom compression garment with modified independence with extra time. She feels comfortable in garment and is eager to wear it daily. Long lasting cellulitis has been a significant hinderance to LLE goals. Now that this infection is under control we are hopeful volume reduction will accelerate beyond a snails pace. ;-) Cont as per POC..    Occupational performance deficits (Please refer to evaluation for details): ADL's;IADL's;Rest and Sleep;Work;Leisure;Social Participation    Rehab Potential Good    Clinical Decision Making Multiple treatment options, significant modification of task necessary    Comorbidities Affecting Occupational Performance: Presence of comorbidities impacting occupational performance    Modification or Assistance to Complete Evaluation  Min-Moderate modification of tasks or assist with assess necessary to complete eval    OT Treatment/Interventions Self-care/ADL training;Therapeutic exercise;Functional Mobility Training;Manual Therapy;Energy conservation;Manual lymph drainage;Therapeutic activities;Coping strategies training;DME and/or AE instruction;Compression bandaging;Patient/family education    Plan Complete Deconhgestive Therapy (CDT) Intensive and Self -Management phases, consisting of Manual lymphatic Drainage (MLD), skin care, ther ex and compression bandaging    OT Home Exercise Plan lymphatic pumping  therex 2-3 x q day, in sequence, 10 reps  bilaterally    Recommended Other Services Daytime: 2 Pr. Jobst EVAREX custom, ccl 2, A-D, 2.5 cm SB at oblique top edge, open toe, t heel.  HOS: Jobst RELAX custom ccl 2 A-D, OT w. posterior zipper           Patient will benefit from skilled therapeutic intervention in order to improve the following deficits and impairments:           Visit Diagnosis: Lymphedema, not elsewhere classified    Problem List Patient Active Problem List   Diagnosis Date Noted  . Iron deficiency 08/06/2018  . History of total left knee replacement (TKR) 07/18/2018  . Rectal bleeding 07/05/2018  . Hemorrhoids 07/05/2018  . Anal itching 07/05/2018  . Rotator cuff arthropathy of right shoulder   . Shoulder arthritis 06/15/2017  . Status post revision of total knee replacement, left 04/29/2017  . Polyethylene liner wear following left total knee arthroplasty requiring isolated polyethylene liner exchange ([place]) 03/02/2017  . 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 Unstable angina ([place]) 05/21/2016  . Chronic diastolic heart failure ([place]) 03/29/2016  . Normal coronary arteries 2009 01/14/2015  . Obesity-BMI 40 01/14/2015  . Restless leg 01/14/2015  . Chest pain 01/14/2015  . Back pain 01/14/2015  . Renal insufficiency 01/14/2015  . Dementia- mild memory issues 01/14/2015  . Occult blood positive stool 12/14/2014  . Anemia 12/14/2014  . Family history of colon cancer 12/14/2014  . Change in bowel habits 12/14/2014  . GERD (gastroesophageal reflux disease) 12/14/2014  . Dyspnea 05/17/2012  . Lymphedema 05/17/2012  . Weight gain 05/17/2012  . HTN (hypertension) 05/17/2012    Andrey Spearman, MS, OTR/L, [facility] 05/13/20 2:41 PM   [place] MAIN [facility] SERVICES [address] [place], [place], [address] Phone: [phone]   Fax:  [phone]  Name: TITYANA PAGAN MRN: [id number] Date  of Birth: May 15, 1946

## 2020-05-14 ENCOUNTER — Other Ambulatory Visit: Payer: Self-pay

## 2020-05-14 ENCOUNTER — Ambulatory Visit: Payer: Medicare Other | Admitting: Occupational Therapy

## 2020-05-14 DIAGNOSIS — I890 Lymphedema, not elsewhere classified: Secondary | ICD-10-CM

## 2020-05-14 NOTE — Therapy (Signed)
[place] MAIN [facility] SERVICES [address] [place], [place], [address] Phone: [phone]   Fax:  [phone]  Occupational Therapy Treatment  Patient Details  Name: [name] MRN: [id number] Date of Birth: 06/30/1946 Referring Provider (OT): Juluis Pitch, MD   Encounter Date: 05/14/2020   OT End of Session - 05/14/20 1249    Visit Number 41    Number of Visits 72    Date for OT Re-Evaluation 08/01/20    OT Start Time 1007    OT Stop Time 1109    OT Time Calculation (min) 62 min    Activity Tolerance Patient tolerated treatment well;No increased pain           Past Medical History:  Diagnosis Date  . Anemia   . Anxiety   . Arthritis    "hands, feet" (03/03/2017)  . Brain lesion    2 types  . Cataract   . Cervical spondylosis with myelopathy   . Chest pain    Normal cardiac cath 5/09  . Chronic lower back pain   . CKD (chronic kidney disease), stage III    [name]  . Congestive heart failure (CHF) ([place])    has a Cardiomems implant   . Constipation   . Dementia ([place])   . Depression   . Dumping syndrome   . Dyspnea    with activity  . Edema   . Fatigue   . Fibromyalgia   . GERD (gastroesophageal reflux disease)   . Headache    "w/high CBG" (03/03/2017)  . History of echocardiogram    a. Echo 03/20/16 (done at [facility] in [place], [place]):  mild LVH, EF 64%, normal diastolic function, mild LAE, MAC, RVSP 25 mmHg  . Hyperlipidemia   . Hypertension   . Hypothyroidism   . Lumbar spondylosis   . Lymphedema    seeing specialist for this  . Migraine    "mostly stopped when I changed my diet" (03/03/2017)  . OSA on CPAP   . Pneumonia X 1  . PONV (postoperative nausea and vomiting)   . Restless leg syndrome   . Rheumatoid arthritis ([place])   . Stroke [facility])    TIAs "mini strokes"- unsure of last TIA - pt and daughter deny this  . Syncope   . Tremors of nervous system   . Type II diabetes  mellitus ([place])     Past Surgical History:  Procedure Laterality Date  . APPENDECTOMY  1966  . BACK SURGERY    . CARDIAC CATHETERIZATION N/A 05/25/2016   Procedure: Right/Left Heart Cath and Coronary Angiography;  Surgeon: Larey Dresser, MD;  Location: [facility];  Service: Cardiovascular;  Laterality: N/A;  . CATARACT EXTRACTION W/ INTRAOCULAR LENS  IMPLANT, BILATERAL Bilateral   . COLONOSCOPY    . CORONARY ANGIOGRAM  2009   Normal coronaries  . EXCISION/RELEASE BURSA HIP Bilateral   . I & D KNEE WITH POLY EXCHANGE Left 03/02/2017   Procedure: Left knee Revision of poly liner;  Surgeon: Mcarthur Rossetti, MD;  Location: [place];  Service: Orthopedics;  Laterality: Left;  . JOINT REPLACEMENT    . KNEE ARTHROSCOPY Bilateral   . LAPAROSCOPIC CHOLECYSTECTOMY  2015  . LMF  2017   CardiMEMS HF implant for CHF (measures amount of fluid in the heart)  . LUMBAR DISC SURGERY    . LUMBAR LAMINECTOMY/DECOMPRESSION MICRODISCECTOMY Left 12/2005   L2-3 laminectomy and diskectomy/notes 01/06/2011  .  POSTERIOR LUMBAR FUSION  04/2006   [name] 01/06/2011; "put cages in"  . REVERSE SHOULDER ARTHROPLASTY Left 06/15/2017   Procedure: REVERSE LEFT SHOULDER ARTHROPLASTY;  Surgeon: Meredith Pel, MD;  Location: [place];  Service: Orthopedics;  Laterality: Left;  . REVERSE SHOULDER ARTHROPLASTY Right 02/01/2018  . REVERSE SHOULDER ARTHROPLASTY Right 02/01/2018   Procedure: RIGHT REVERSE SHOULDER ARTHROPLASTY;  Surgeon: Meredith Pel, MD;  Location: [facility];  Service: Orthopedics;  Laterality: Right;  . REVISION TOTAL KNEE ARTHROPLASTY Left 03/02/2017   poly liner/notes 03/02/2017  . RIGHT HEART CATH N/A 11/13/2016   Procedure: Right Heart Cath with Cardiomems;  Surgeon: Larey Dresser, MD;  Location: [place];  Service: Cardiovascular;  Laterality: N/A;  . SHOULDER ARTHROSCOPY WITH ROTATOR CUFF REPAIR Right   . SHOULDER OPEN ROTATOR CUFF REPAIR Left 01/2011   [name] 01/29/2011  .  TOTAL ABDOMINAL HYSTERECTOMY    . TOTAL KNEE ARTHROPLASTY Left   . TOTAL KNEE ARTHROPLASTY Right 11/18/2012   Procedure: TOTAL KNEE ARTHROPLASTY;  Surgeon: Yvette Rack., MD;  Location: [place];  Service: Orthopedics;  Laterality: Right;  . TUBAL LIGATION    . UPPER GASTROINTESTINAL ENDOSCOPY    . UPPER GI ENDOSCOPY      There were no vitals filed for this visit.   Subjective Assessment - 05/14/20 1018    Subjective  Mrs Row presents to OT for Rx visit 41/72 to address BLE lipo-lymphedema (I89.0). Pt denies LE-related pain. Pt presents with LLE compression stocking on wrong leg. Pt reports staff assisting her with donning garment this morning insisted garment is for RLE not the Left. The label inside the custom garment ismarked with the patient's name and LLE.    Pertinent History chronic BLE leg swelling and associated pain ~ 40 years; hx herniated intervertebral discL TKA; L total knee revision; HTN, Obesity. dementia; restless leg; CHF; unstable anina; R RC arthroplasty, OA, cervical spondylosis with myelopathy, chronic lower back pain, CKD (Stage?); Fibromyalgia; Hypothyroidism; OSA ( cPap?); hx pneumonia; RA, Type II Diabetes. S/P cardiac cathg, B THA, Lumbar laminectomy, posterior lumbar fusion, B shoulder arthroplasty; total abdominal hysterectomy    Limitations chronic leg swelling and associated pain, difficulty walking, impaired dynamic balance, muscle weakness,    Repetition Increases Symptoms    Special Tests + STEMMER sign base of toes bilaterally                        OT Treatments/Exercises (OP) - 05/14/20 0001      ADLs   ADL Education Given Yes      Manual Therapy   Manual Therapy Edema management;Compression Bandaging;Manual Lymphatic Drainage (MLD)    Manual Lymphatic Drainage (MLD) Max A to shift L stocking off of incorrect RLE and don onto LLE.  MLD to RLE utilizing modified "short neck sequence" with no strokes to neck in keeping w' LE thyroid  precautions. UTILIZED DEEP ABDOMINAL PATHWAYS VIS DIAPHRAGMATIC BREATHING, FUNCTIONAL INGUINAL ln, AND PROGIDED DYNAMIC STROKES TO LEG IN SEGMENTS FROM PROXIMAL TO DISTAL. eXCELLENT TOLERANCE.    Compression Bandaging RLE knee length gradient wraps as established                  OT Education - 05/14/20 1248    Education Details Continued skilled Pt/caregiver education  And LE ADL training throughout visit for lymphedema self care/ home program, including compression wrapping, compression garment and device wear/care, lymphatic pumping ther ex, simple self-MLD, and skin care. Discussed  progress towards goals.    Person(s) Educated Patient    Methods Explanation;Demonstration;Tactile cues;Verbal cues    Comprehension Verbalized understanding;Returned demonstration;Need further instruction;Verbal cues required;Tactile cues required               OT Long Term Goals - 05/13/20 1430      OT LONG TERM GOAL #1   Title Pt will be able to apply LLE multi-layer, short stretch compression wraps daily with maximum caregiver assistance, using correct gradient techniques independently to return affected limb/s, as closely as possible, to premorbid size and shape, to limit infection risk, and to improve safe functional mobility and ADLs performance.    Baseline dependent    Time 4    Period Days    Status Achieved   Pt has no assistance at SNF for compression wrapping. Pt launders and rolls bandages independently. Pt still requires ongoing verbal cues to lift leg and reposition wheil assistant waraps. 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     OT LONG TERM GOAL #2   Title Pt will be able to verbalize signs and symptoms of cellulitis infection and identify 4 common lymphedema precautions using printed resource for reference (modified independence) to limit LE progression over time.    Baseline Max A    Time 4    Period Days    Status Achieved      OT LONG TERM GOAL #3   Title Pt will  sustain than 85% compliance with all daily LE self-care home program components throughout Intensive Phase CDT, including impeccable skin care, lymphatic pumping therex,  compression wraps and simple self MLD, to ensure optimal limb volume reduction, to limit infection risk and to limit LE progression.    Baseline dependent    Time 12    Period Weeks    Status Achieved   Pt has achieved ~ 75% compliance with LE self care home program. Pt unableto reapply wraps between visits if she has to remove them     .     OT LONG TERM GOAL #4   Title Pt to achieve at least 10% LLE limb volume reduction during Intensive Phase CDT with Max CG assistance for wrapping, to improve functional performance of basic and instrumental ADLs, and to limit LE progression.    Baseline dependent    Time 12    Period Weeks    Status Partially Met   Met for all LLE segments and for full leg. Reduction goal for overall reduction of LLE limb volume to 20%. Now that RLE cellulitis is under better control we commenced CDT toLLE. By visual assessment would estimate ~  10% reduction thus far.     OT LONG TERM GOAL #5   Title During self-management phase of CDT Pt will retain limb volume reductions achieved during Intensive Phase CDT with no more than 3% volume increase using all LE self care components  to limit LE progression and further functional decline.    Baseline dependent    Time 6    Period Months    Status On-going                 Plan - 05/14/20 1251    Clinical Impression Statement Staff at SNFdonned custom LLE compression stocking on R leg despite Pt directing staff to don it on LLE. There is a label inside each stocking and device with the Patient's name and the limb designation.  Pt tolerated MLD skin care and compression wraps to RLE  today without increased pain. RLE swelling is moderately increased ths morning    Occupational performance deficits (Please refer to evaluation for details):  ADL's;IADL's;Rest and Sleep;Work;Leisure;Social Participation    Rehab Potential Good    Clinical Decision Making Multiple treatment options, significant modification of task necessary    Comorbidities Affecting Occupational Performance: Presence of comorbidities impacting occupational performance    Modification or Assistance to Complete Evaluation  Min-Moderate modification of tasks or assist with assess necessary to complete eval    OT Treatment/Interventions Self-care/ADL training;Therapeutic exercise;Functional Mobility Training;Manual Therapy;Energy conservation;Manual lymph drainage;Therapeutic activities;Coping strategies training;DME and/or AE instruction;Compression bandaging;Patient/family education    Plan Complete Deconhgestive Therapy (CDT) Intensive and Self -Management phases, consisting of Manual lymphatic Drainage (MLD), skin care, ther ex and compression bandaging    OT Home Exercise Plan lymphatic pumping therex 2-3 x q day, in sequence, 10 reps bilaterally    Recommended Other Services Daytime: 2 Pr. [employer] EVAREX custom, ccl 2, A-D, 2.5 cm SB at oblique top edge, open toe, t heel.  HOS: [employer] RELAX custom ccl 2 A-D, OT w. posterior zipper           Patient will benefit from skilled therapeutic intervention in order to improve the following deficits and impairments:           Visit Diagnosis: Lymphedema, not elsewhere classified    Problem List Patient Active Problem List   Diagnosis Date Noted  . Iron deficiency 08/06/2018  . History of total left knee replacement (TKR) 07/18/2018  . Rectal bleeding 07/05/2018  . Hemorrhoids 07/05/2018  . Anal itching 07/05/2018  . Rotator cuff arthropathy of right shoulder   . Shoulder arthritis 06/15/2017  . Status post revision of total knee replacement, left 04/29/2017  . Polyethylene liner wear following left total knee arthroplasty requiring isolated polyethylene liner exchange ([place]) 03/02/2017  . 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 Unstable angina ([place]) 05/21/2016  . Chronic diastolic heart failure ([place]) 03/29/2016  . Normal coronary arteries 2009 01/14/2015  . Obesity-BMI 40 01/14/2015  . Restless leg 01/14/2015  . Chest pain 01/14/2015  . Back pain 01/14/2015  . Renal insufficiency 01/14/2015  . Dementia- mild memory issues 01/14/2015  . Occult blood positive stool 12/14/2014  . Anemia 12/14/2014  . Family history of colon cancer 12/14/2014  . Change in bowel habits 12/14/2014  . GERD (gastroesophageal reflux disease) 12/14/2014  . Dyspnea 05/17/2012  . Lymphedema 05/17/2012  . Weight gain 05/17/2012  . HTN (hypertension) 05/17/2012    Andrey Spearman, MS, OTR/L, [facility] 05/14/20 12:57 PM  [place] MAIN [facility] SERVICES [address] [place], [place], [address] Phone: [phone]   Fax:  [phone]  Name: SHYAH CADMUS MRN: [id number] Date of Birth: [birth year]

## 2020-05-15 ENCOUNTER — Other Ambulatory Visit: Payer: Self-pay

## 2020-05-15 ENCOUNTER — Ambulatory Visit: Payer: Medicare Other | Admitting: Occupational Therapy

## 2020-05-15 DIAGNOSIS — I890 Lymphedema, not elsewhere classified: Secondary | ICD-10-CM | POA: Diagnosis not present

## 2020-05-15 NOTE — Therapy (Signed)
[place] MAIN [facility] SERVICES [address] [place], [place], [address] Phone: [phone]   Fax:  [phone]  Occupational Therapy Treatment  Patient Details  Name: [name] MRN: [id number] Date of Birth: 13-Jun-1946 Referring Provider (OT): Juluis Pitch, MD   Encounter Date: 05/15/2020   OT End of Session - 05/15/20 1250    Visit Number 42    Number of Visits 72    Date for OT Re-Evaluation 08/01/20    OT Start Time 1010    OT Stop Time 1110    OT Time Calculation (min) 60 min    Activity Tolerance Patient tolerated treatment well;No increased pain           Past Medical History:  Diagnosis Date  . Anemia   . Anxiety   . Arthritis    "hands, feet" (03/03/2017)  . Brain lesion    2 types  . Cataract   . Cervical spondylosis with myelopathy   . Chest pain    Normal cardiac cath 5/09  . Chronic lower back pain   . CKD (chronic kidney disease), stage III    [name]  . Congestive heart failure (CHF) ([place])    has a Cardiomems implant   . Constipation   . Dementia ([place])   . Depression   . Dumping syndrome   . Dyspnea    with activity  . Edema   . Fatigue   . Fibromyalgia   . GERD (gastroesophageal reflux disease)   . Headache    "w/high CBG" (03/03/2017)  . History of echocardiogram    a. Echo 03/20/16 (done at [facility] in [place], [place]):  mild LVH, EF 73%, normal diastolic function, mild LAE, MAC, RVSP 25 mmHg  . Hyperlipidemia   . Hypertension   . Hypothyroidism   . Lumbar spondylosis   . Lymphedema    seeing specialist for this  . Migraine    "mostly stopped when I changed my diet" (03/03/2017)  . OSA on CPAP   . Pneumonia X 1  . PONV (postoperative nausea and vomiting)   . Restless leg syndrome   . Rheumatoid arthritis ([place])   . Stroke [facility])    TIAs "mini strokes"- unsure of last TIA - pt and daughter deny this  . Syncope   . Tremors of nervous system   . Type II diabetes  mellitus ([place])     Past Surgical History:  Procedure Laterality Date  . APPENDECTOMY  1966  . BACK SURGERY    . CARDIAC CATHETERIZATION N/A 05/25/2016   Procedure: Right/Left Heart Cath and Coronary Angiography;  Surgeon: Larey Dresser, MD;  Location: [place] [facility];  Service: Cardiovascular;  Laterality: N/A;  . CATARACT EXTRACTION W/ INTRAOCULAR LENS  IMPLANT, BILATERAL Bilateral   . COLONOSCOPY    . CORONARY ANGIOGRAM  2009   Normal coronaries  . EXCISION/RELEASE BURSA HIP Bilateral   . I & D KNEE WITH POLY EXCHANGE Left 03/02/2017   Procedure: Left knee Revision of poly liner;  Surgeon: Mcarthur Rossetti, MD;  Location: [place];  Service: Orthopedics;  Laterality: Left;  . JOINT REPLACEMENT    . KNEE ARTHROSCOPY Bilateral   . LAPAROSCOPIC CHOLECYSTECTOMY  2015  . LMF  2017   CardiMEMS HF implant for CHF (measures amount of fluid in the heart)  . LUMBAR DISC SURGERY    . LUMBAR LAMINECTOMY/DECOMPRESSION MICRODISCECTOMY Left 12/2005   L2-3 laminectomy and diskectomy/notes 01/06/2011  .  POSTERIOR LUMBAR FUSION  04/2006   [name] 01/06/2011; "put cages in"  . REVERSE SHOULDER ARTHROPLASTY Left 06/15/2017   Procedure: REVERSE LEFT SHOULDER ARTHROPLASTY;  Surgeon: Meredith Pel, MD;  Location: [place];  Service: Orthopedics;  Laterality: Left;  . REVERSE SHOULDER ARTHROPLASTY Right 02/01/2018  . REVERSE SHOULDER ARTHROPLASTY Right 02/01/2018   Procedure: RIGHT REVERSE SHOULDER ARTHROPLASTY;  Surgeon: Meredith Pel, MD;  Location: [place];  Service: Orthopedics;  Laterality: Right;  . REVISION TOTAL KNEE ARTHROPLASTY Left 03/02/2017   poly liner/notes 03/02/2017  . RIGHT HEART CATH N/A 11/13/2016   Procedure: Right Heart Cath with Cardiomems;  Surgeon: Larey Dresser, MD;  Location: [place] [facility];  Service: Cardiovascular;  Laterality: N/A;  . SHOULDER ARTHROSCOPY WITH ROTATOR CUFF REPAIR Right   . SHOULDER OPEN ROTATOR CUFF REPAIR Left 01/2011   [name] 01/29/2011  .  TOTAL ABDOMINAL HYSTERECTOMY    . TOTAL KNEE ARTHROPLASTY Left   . TOTAL KNEE ARTHROPLASTY Right 11/18/2012   Procedure: TOTAL KNEE ARTHROPLASTY;  Surgeon: Yvette Rack., MD;  Location: [place];  Service: Orthopedics;  Laterality: Right;  . TUBAL LIGATION    . UPPER GASTROINTESTINAL ENDOSCOPY    . UPPER GI ENDOSCOPY      There were no vitals filed for this visit.   Subjective Assessment - 05/15/20 1014    Subjective  [name] presents to OT for Rx visit 42/72 to address BLE lipo-lymphedema (I89.0). Pt presents with LLE compression garment on correct L leg.Pt reports R leg "is still sore if you mash it too hard on that place where I showed you ( anterior distal leg , area of discoloration). It's about a 4/10."    Pertinent History chronic BLE leg swelling and associated pain ~ 40 years; hx herniated intervertebral discL TKA; L total knee revision; HTN, Obesity. dementia; restless leg; CHF; unstable anina; R RC arthroplasty, OA, cervical spondylosis with myelopathy, chronic lower back pain, CKD (Stage?); Fibromyalgia; Hypothyroidism; OSA ( cPap?); hx pneumonia; RA, Type II Diabetes. S/P cardiac cathg, B THA, Lumbar laminectomy, posterior lumbar fusion, B shoulder arthroplasty; total abdominal hysterectomy    Limitations chronic leg swelling and associated pain, difficulty walking, impaired dynamic balance, muscle weakness,    Repetition Increases Symptoms    Special Tests + STEMMER sign base of toes bilaterally                        OT Treatments/Exercises (OP) - 05/15/20 0001      ADLs   ADL Education Given Yes (P)       Manual Therapy   Manual Therapy Edema management;Compression Bandaging;Manual Lymphatic Drainage (MLD) (P)     Manual Lymphatic Drainage (MLD)  MLD to RLE utilizing modified "short neck sequence" with no strokes to neck in keeping w' LE thyroid precautions. UTILIZED DEEP ABDOMINAL PATHWAYS VIS DIAPHRAGMATIC BREATHING, FUNCTIONAL INGUINAL ln, AND PROGIDED  DYNAMIC STROKES TO LEG IN SEGMENTS FROM PROXIMAL TO DISTAL. eXCELLENT TOLERANCE. (P)     Compression Bandaging RLE knee length gradient wraps as established (P)                   OT Education - 05/15/20 1250    Education Details Continued skilled Pt/caregiver education  And LE ADL training throughout visit for lymphedema self care/ home program, including compression wrapping, compression garment and device wear/care, lymphatic pumping ther ex, simple self-MLD, and skin care. Discussed progress towards goals.    Person(s) Educated Patient  Methods Explanation;Demonstration;Tactile cues;Verbal cues    Comprehension Verbalized understanding;Returned demonstration;Need further instruction;Verbal cues required;Tactile cues required               OT Long Term Goals - 05/13/20 1430      OT LONG TERM GOAL #1   Title Pt will be able to apply LLE multi-layer, short stretch compression wraps daily with maximum caregiver assistance, using correct gradient techniques independently to return affected limb/s, as closely as possible, to premorbid size and shape, to limit infection risk, and to improve safe functional mobility and ADLs performance.    Baseline dependent    Time 4    Period Days    Status Achieved   Pt has no assistance at SNF for compression wrapping. Pt launders and rolls bandages independently. Pt still requires ongoing verbal cues to lift leg and reposition wheil assistant waraps. 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     OT LONG TERM GOAL #2   Title Pt will be able to verbalize signs and symptoms of cellulitis infection and identify 4 common lymphedema precautions using printed resource for reference (modified independence) to limit LE progression over time.    Baseline Max A    Time 4    Period Days    Status Achieved      OT LONG TERM GOAL #3   Title Pt will sustain than 85% compliance with all daily LE self-care home program components throughout Intensive  Phase CDT, including impeccable skin care, lymphatic pumping therex,  compression wraps and simple self MLD, to ensure optimal limb volume reduction, to limit infection risk and to limit LE progression.    Baseline dependent    Time 12    Period Weeks    Status Achieved   Pt has achieved ~ 75% compliance with LE self care home program. Pt unableto reapply wraps between visits if she has to remove them     .     OT LONG TERM GOAL #4   Title Pt to achieve at least 10% LLE limb volume reduction during Intensive Phase CDT with Max CG assistance for wrapping, to improve functional performance of basic and instrumental ADLs, and to limit LE progression.    Baseline dependent    Time 12    Period Weeks    Status Partially Met   Met for all LLE segments and for full leg. Reduction goal for overall reduction of LLE limb volume to 20%. Now that RLE cellulitis is under better control we commenced CDT toLLE. By visual assessment would estimate ~  10% reduction thus far.     OT LONG TERM GOAL #5   Title During self-management phase of CDT Pt will retain limb volume reductions achieved during Intensive Phase CDT with no more than 3% volume increase using all LE self care components  to limit LE progression and further functional decline.    Baseline dependent    Time 6    Period Months    Status On-going                 Plan - 05/15/20 1252    Clinical Impression Statement LLE custom compression garment correctly applied this morning. RLE volume continues to reduce and skin condition to improve. Redness persists and areas of tenderness remain. Moderate swelling and tissue density remain. Cont as per POC.    Occupational performance deficits (Please refer to evaluation for details): ADL's;IADL's;Rest and Sleep;Work;Leisure;Social Participation    Rehab Potential Good  Clinical Decision Making Multiple treatment options, significant modification of task necessary    Comorbidities Affecting  Occupational Performance: Presence of comorbidities impacting occupational performance    Modification or Assistance to Complete Evaluation  Min-Moderate modification of tasks or assist with assess necessary to complete eval    OT Treatment/Interventions Self-care/ADL training;Therapeutic exercise;Functional Mobility Training;Manual Therapy;Energy conservation;Manual lymph drainage;Therapeutic activities;Coping strategies training;DME and/or AE instruction;Compression bandaging;Patient/family education    Plan Complete Deconhgestive Therapy (CDT) Intensive and Self -Management phases, consisting of Manual lymphatic Drainage (MLD), skin care, ther ex and compression bandaging    OT Home Exercise Plan lymphatic pumping therex 2-3 x q day, in sequence, 10 reps bilaterally    Recommended Other Services Daytime: 2 Pr. Jobst EVAREX custom, ccl 2, A-D, 2.5 cm SB at oblique top edge, open toe, t heel.  HOS: Jobst RELAX custom ccl 2 A-D, OT w. posterior zipper           Patient will benefit from skilled therapeutic intervention in order to improve the following deficits and impairments:           Visit Diagnosis: Lymphedema, not elsewhere classified    Problem List Patient Active Problem List   Diagnosis Date Noted  . Iron deficiency 08/06/2018  . History of total left knee replacement (TKR) 07/18/2018  . Rectal bleeding 07/05/2018  . Hemorrhoids 07/05/2018  . Anal itching 07/05/2018  . Rotator cuff arthropathy of right shoulder   . Shoulder arthritis 06/15/2017  . Status post revision of total knee replacement, left 04/29/2017  . Polyethylene liner wear following left total knee arthroplasty requiring isolated polyethylene liner exchange ([place]) 03/02/2017  . 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 Unstable angina ([place]) 05/21/2016  . Chronic diastolic heart failure ([place]) 03/29/2016  . Normal coronary arteries 2009 01/14/2015  . Obesity-BMI 40 01/14/2015  .  Restless leg 01/14/2015  . Chest pain 01/14/2015  . Back pain 01/14/2015  . Renal insufficiency 01/14/2015  . Dementia- mild memory issues 01/14/2015  . Occult blood positive stool 12/14/2014  . Anemia 12/14/2014  . Family history of colon cancer 12/14/2014  . Change in bowel habits 12/14/2014  . GERD (gastroesophageal reflux disease) 12/14/2014  . Dyspnea 05/17/2012  . Lymphedema 05/17/2012  . Weight gain 05/17/2012  . HTN (hypertension) 05/17/2012   Andrey Spearman, MS, OTR/L, [facility] 05/15/20 12:55 PM  [place] MAIN [facility] SERVICES [address] [place], [place], [address] Phone: [phone]   Fax:  [phone]  Name: [name] MRN: [id number] Date of Birth: [birth year]

## 2020-05-16 ENCOUNTER — Ambulatory Visit: Payer: Medicare Other | Admitting: Occupational Therapy

## 2020-05-17 ENCOUNTER — Ambulatory Visit: Payer: Medicare Other | Admitting: Occupational Therapy

## 2020-05-21 ENCOUNTER — Ambulatory Visit: Payer: Medicare Other | Admitting: Occupational Therapy

## 2020-05-22 ENCOUNTER — Ambulatory Visit: Payer: Medicare Other | Admitting: Occupational Therapy

## 2020-05-22 ENCOUNTER — Other Ambulatory Visit: Payer: Self-pay

## 2020-05-22 DIAGNOSIS — I890 Lymphedema, not elsewhere classified: Secondary | ICD-10-CM | POA: Diagnosis not present

## 2020-05-22 NOTE — Therapy (Signed)
[place] MAIN [facility] SERVICES [address] [place], [place], [address] Phone: [phone]   Fax:  [phone]  Occupational Therapy Treatment  Patient Details  Name: [name] MRN: [id number] Date of Birth: November 25, 1945 Referring Provider (OT): Juluis Pitch, MD   Encounter Date: 05/22/2020   OT End of Session - 05/22/20 1625    Visit Number 43    Number of Visits 72    Date for OT Re-Evaluation 08/01/20    OT Start Time 1010    OT Stop Time 1110    OT Time Calculation (min) 60 min    Activity Tolerance Patient tolerated treatment well;No increased pain    Behavior During Therapy WFL for tasks assessed/performed           Past Medical History:  Diagnosis Date  . Anemia   . Anxiety   . Arthritis    "hands, feet" (03/03/2017)  . Brain lesion    2 types  . Cataract   . Cervical spondylosis with myelopathy   . Chest pain    Normal cardiac cath 5/09  . Chronic lower back pain   . CKD (chronic kidney disease), stage III    [name]  . Congestive heart failure (CHF) ([place])    has a Cardiomems implant   . Constipation   . Dementia ([place])   . Depression   . Dumping syndrome   . Dyspnea    with activity  . Edema   . Fatigue   . Fibromyalgia   . GERD (gastroesophageal reflux disease)   . Headache    "w/high CBG" (03/03/2017)  . History of echocardiogram    a. Echo 03/20/16 (done at [facility] in [place], [place]):  mild LVH, EF 83%, normal diastolic function, mild LAE, MAC, RVSP 25 mmHg  . Hyperlipidemia   . Hypertension   . Hypothyroidism   . Lumbar spondylosis   . Lymphedema    seeing specialist for this  . Migraine    "mostly stopped when I changed my diet" (03/03/2017)  . OSA on CPAP   . Pneumonia X 1  . PONV (postoperative nausea and vomiting)   . Restless leg syndrome   . Rheumatoid arthritis ([place])   . Stroke [facility])    TIAs "mini strokes"- unsure of last TIA - pt and daughter deny this  .  Syncope   . Tremors of nervous system   . Type II diabetes mellitus ([place])     Past Surgical History:  Procedure Laterality Date  . APPENDECTOMY  1966  . BACK SURGERY    . CARDIAC CATHETERIZATION N/A 05/25/2016   Procedure: Right/Left Heart Cath and Coronary Angiography;  Surgeon: Larey Dresser, MD;  Location: [place] [facility];  Service: Cardiovascular;  Laterality: N/A;  . CATARACT EXTRACTION W/ INTRAOCULAR LENS  IMPLANT, BILATERAL Bilateral   . COLONOSCOPY    . CORONARY ANGIOGRAM  2009   Normal coronaries  . EXCISION/RELEASE BURSA HIP Bilateral   . I & D KNEE WITH POLY EXCHANGE Left 03/02/2017   Procedure: Left knee Revision of poly liner;  Surgeon: Mcarthur Rossetti, MD;  Location: [place];  Service: Orthopedics;  Laterality: Left;  . JOINT REPLACEMENT    . KNEE ARTHROSCOPY Bilateral   . LAPAROSCOPIC CHOLECYSTECTOMY  2015  . LMF  2017   CardiMEMS HF implant for CHF (measures amount of fluid in the heart)  . LUMBAR DISC SURGERY    . LUMBAR LAMINECTOMY/DECOMPRESSION MICRODISCECTOMY Left  12/2005   L2-3 laminectomy and diskectomy/notes 01/06/2011  . POSTERIOR LUMBAR FUSION  04/2006   [name] 01/06/2011; "put cages in"  . REVERSE SHOULDER ARTHROPLASTY Left 06/15/2017   Procedure: REVERSE LEFT SHOULDER ARTHROPLASTY;  Surgeon: Meredith Pel, MD;  Location: [place];  Service: Orthopedics;  Laterality: Left;  . REVERSE SHOULDER ARTHROPLASTY Right 02/01/2018  . REVERSE SHOULDER ARTHROPLASTY Right 02/01/2018   Procedure: RIGHT REVERSE SHOULDER ARTHROPLASTY;  Surgeon: Meredith Pel, MD;  Location: [place];  Service: Orthopedics;  Laterality: Right;  . REVISION TOTAL KNEE ARTHROPLASTY Left 03/02/2017   poly liner/notes 03/02/2017  . RIGHT HEART CATH N/A 11/13/2016   Procedure: Right Heart Cath with Cardiomems;  Surgeon: Larey Dresser, MD;  Location: [place] [facility];  Service: Cardiovascular;  Laterality: N/A;  . SHOULDER ARTHROSCOPY WITH ROTATOR CUFF REPAIR Right   . SHOULDER  OPEN ROTATOR CUFF REPAIR Left 01/2011   [name] 01/29/2011  . TOTAL ABDOMINAL HYSTERECTOMY    . TOTAL KNEE ARTHROPLASTY Left   . TOTAL KNEE ARTHROPLASTY Right 11/18/2012   Procedure: TOTAL KNEE ARTHROPLASTY;  Surgeon: Yvette Rack., MD;  Location: [place];  Service: Orthopedics;  Laterality: Right;  . TUBAL LIGATION    . UPPER GASTROINTESTINAL ENDOSCOPY    . UPPER GI ENDOSCOPY      There were no vitals filed for this visit.   Subjective Assessment - 05/22/20 1622    Subjective  Mrs Pollman presents to OT for Rx visit 43/72 to address BLE lipo-lymphedema (I89.0). Pt presents without compression garments or wraps in place. Pt brings HOS device  and clean wraps to clinic. Pt reports her RLE swellling was much reduced when she removed wraps after last session, but she was unable to access wraps for 4 days since unwrapping.SNF staff do not apply compression wraps.    Pertinent History chronic BLE leg swelling and associated pain ~ 40 years; hx herniated intervertebral discL TKA; L total knee revision; HTN, Obesity. dementia; restless leg; CHF; unstable anina; R RC arthroplasty, OA, cervical spondylosis with myelopathy, chronic lower back pain, CKD (Stage?); Fibromyalgia; Hypothyroidism; OSA ( cPap?); hx pneumonia; RA, Type II Diabetes. S/P cardiac cathg, B THA, Lumbar laminectomy, posterior lumbar fusion, B shoulder arthroplasty; total abdominal hysterectomy    Limitations chronic leg swelling and associated pain, difficulty walking, impaired dynamic balance, muscle weakness,    Repetition Increases Symptoms    Special Tests + STEMMER sign base of toes bilaterally                        OT Treatments/Exercises (OP) - 05/22/20 0001      ADLs   ADL Education Given Yes      Manual Therapy   Manual Therapy Edema management;Compression Bandaging;Manual Lymphatic Drainage (MLD)    Manual Lymphatic Drainage (MLD)  MLD to RLE utilizing modified "short neck sequence" with no strokes to neck in  keeping w' LE thyroid precautions. UTILIZED DEEP ABDOMINAL PATHWAYS VIS DIAPHRAGMATIC BREATHING, FUNCTIONAL INGUINAL ln, AND PROGIDED DYNAMIC STROKES TO LEG IN SEGMENTS FROM PROXIMAL TO DISTAL. eXCELLENT TOLERANCE.    Compression Bandaging RLE knee length gradient wraps as established. Jobst RELAX to LLE.                   OT Education - 05/22/20 1625    Education Details Continued skilled Pt/caregiver education  And LE ADL training throughout visit for lymphedema self care/ home program, including compression wrapping, compression garment and device wear/care, lymphatic  pumping ther ex, simple self-MLD, and skin care. Discussed progress towards goals.    Person(s) Educated Patient    Methods Explanation;Demonstration;Tactile cues;Verbal cues    Comprehension Verbalized understanding;Returned demonstration;Need further instruction;Verbal cues required;Tactile cues required               OT Long Term Goals - 05/13/20 1430      OT LONG TERM GOAL #1   Title Pt will be able to apply LLE multi-layer, short stretch compression wraps daily with maximum caregiver assistance, using correct gradient techniques independently to return affected limb/s, as closely as possible, to premorbid size and shape, to limit infection risk, and to improve safe functional mobility and ADLs performance.    Baseline dependent    Time 4    Period Days    Status Achieved   Pt has no assistance at SNF for compression wrapping. Pt launders and rolls bandages independently. Pt still requires ongoing verbal cues to lift leg and reposition wheil assistant waraps. 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     OT LONG TERM GOAL #2   Title Pt will be able to verbalize signs and symptoms of cellulitis infection and identify 4 common lymphedema precautions using printed resource for reference (modified independence) to limit LE progression over time.    Baseline Max A    Time 4    Period Days    Status Achieved       OT LONG TERM GOAL #3   Title Pt will sustain than 85% compliance with all daily LE self-care home program components throughout Intensive Phase CDT, including impeccable skin care, lymphatic pumping therex,  compression wraps and simple self MLD, to ensure optimal limb volume reduction, to limit infection risk and to limit LE progression.    Baseline dependent    Time 12    Period Weeks    Status Achieved   Pt has achieved ~ 75% compliance with LE self care home program. Pt unableto reapply wraps between visits if she has to remove them     .     OT LONG TERM GOAL #4   Title Pt to achieve at least 10% LLE limb volume reduction during Intensive Phase CDT with Max CG assistance for wrapping, to improve functional performance of basic and instrumental ADLs, and to limit LE progression.    Baseline dependent    Time 12    Period Weeks    Status Partially Met   Met for all LLE segments and for full leg. Reduction goal for overall reduction of LLE limb volume to 20%. Now that RLE cellulitis is under better control we commenced CDT toLLE. By visual assessment would estimate ~  10% reduction thus far.     OT LONG TERM GOAL #5   Title During self-management phase of CDT Pt will retain limb volume reductions achieved during Intensive Phase CDT with no more than 3% volume increase using all LE self care components  to limit LE progression and further functional decline.    Baseline dependent    Time 6    Period Months    Status On-going                 Plan - 05/22/20 1626    Clinical Impression Statement Pt tolerated MLD, skin care and compression wraps to RLE today withoiut difficulty. LLE swelling is moderately increased this morning without custom garment as it is being laundered at home. RLE swelling and tissue density is substantially increased  today after having no compression on placee for last 4 days. Unfortunately LE care is not provided in SNF, but Pt continues to hold on to mostt  clinical gains thus far with regular manual therapy and clinical wraps applied 2 x weekly .Cont as per POC.    Occupational performance deficits (Please refer to evaluation for details): ADL's;IADL's;Rest and Sleep;Work;Leisure;Social Participation    Rehab Potential Good    Clinical Decision Making Multiple treatment options, significant modification of task necessary    Comorbidities Affecting Occupational Performance: Presence of comorbidities impacting occupational performance    Modification or Assistance to Complete Evaluation  Min-Moderate modification of tasks or assist with assess necessary to complete eval    OT Treatment/Interventions Self-care/ADL training;Therapeutic exercise;Functional Mobility Training;Manual Therapy;Energy conservation;Manual lymph drainage;Therapeutic activities;Coping strategies training;DME and/or AE instruction;Compression bandaging;Patient/family education    Plan Complete Deconhgestive Therapy (CDT) Intensive and Self -Management phases, consisting of Manual lymphatic Drainage (MLD), skin care, ther ex and compression bandaging    OT Home Exercise Plan lymphatic pumping therex 2-3 x q day, in sequence, 10 reps bilaterally    Recommended Other Services Daytime: 2 Pr. Jobst EVAREX custom, ccl 2, A-D, 2.5 cm SB at oblique top edge, open toe, t heel.  HOS: Jobst RELAX custom ccl 2 A-D, OT w. posterior zipper           Patient will benefit from skilled therapeutic intervention in order to improve the following deficits and impairments:           Visit Diagnosis: Lymphedema, not elsewhere classified    Problem List Patient Active Problem List   Diagnosis Date Noted  . Iron deficiency 08/06/2018  . History of total left knee replacement (TKR) 07/18/2018  . Rectal bleeding 07/05/2018  . Hemorrhoids 07/05/2018  . Anal itching 07/05/2018  . Rotator cuff arthropathy of right shoulder   . Shoulder arthritis 06/15/2017  . Status post revision of total  knee replacement, left 04/29/2017  . Polyethylene liner wear following left total knee arthroplasty requiring isolated polyethylene liner exchange ([place]) 03/02/2017  . 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 Unstable angina ([place]) 05/21/2016  . Chronic diastolic heart failure ([place]) 03/29/2016  . Normal coronary arteries 2009 01/14/2015  . Obesity-BMI 40 01/14/2015  . Restless leg 01/14/2015  . Chest pain 01/14/2015  . Back pain 01/14/2015  . Renal insufficiency 01/14/2015  . Dementia- mild memory issues 01/14/2015  . Occult blood positive stool 12/14/2014  . Anemia 12/14/2014  . Family history of colon cancer 12/14/2014  . Change in bowel habits 12/14/2014  . GERD (gastroesophageal reflux disease) 12/14/2014  . Dyspnea 05/17/2012  . Lymphedema 05/17/2012  . Weight gain 05/17/2012  . HTN (hypertension) 05/17/2012    Andrey Spearman, MS, OTR/L, [facility] 05/22/20 4:29 PM  [place] MAIN [facility] SERVICES [address] [place], [place], [address] Phone: [phone]   Fax:  [phone]  Name: [name] MRN: [id number] Date of Birth: [birth year]

## 2020-05-23 ENCOUNTER — Ambulatory Visit: Payer: Medicare Other | Admitting: Occupational Therapy

## 2020-05-23 ENCOUNTER — Other Ambulatory Visit: Payer: Self-pay

## 2020-05-23 DIAGNOSIS — I890 Lymphedema, not elsewhere classified: Secondary | ICD-10-CM | POA: Diagnosis not present

## 2020-05-23 NOTE — Therapy (Signed)
[place] MAIN [facility] SERVICES [address] [place], [place], [address] Phone: [phone]   Fax:  [phone]  Occupational Therapy Treatment  Patient Details  Name: [name] MRN: [id number] Date of Birth: 02-01-1946 Referring Provider (OT): Juluis Pitch, MD   Encounter Date: 05/23/2020   OT End of Session - 05/23/20 1346    Visit Number 44    Number of Visits 72    Date for OT Re-Evaluation 08/01/20    OT Start Time 1000    OT Stop Time 1100    OT Time Calculation (min) 60 min    Activity Tolerance Patient tolerated treatment well;No increased pain    Behavior During Therapy WFL for tasks assessed/performed           Past Medical History:  Diagnosis Date  . Anemia   . Anxiety   . Arthritis    "hands, feet" (03/03/2017)  . Brain lesion    2 types  . Cataract   . Cervical spondylosis with myelopathy   . Chest pain    Normal cardiac cath 5/09  . Chronic lower back pain   . CKD (chronic kidney disease), stage III    [name]  . Congestive heart failure (CHF) ([place])    has a Cardiomems implant   . Constipation   . Dementia ([place])   . Depression   . Dumping syndrome   . Dyspnea    with activity  . Edema   . Fatigue   . Fibromyalgia   . GERD (gastroesophageal reflux disease)   . Headache    "w/high CBG" (03/03/2017)  . History of echocardiogram    a. Echo 03/20/16 (done at [facility] in [place], [place]):  mild LVH, EF 11%, normal diastolic function, mild LAE, MAC, RVSP 25 mmHg  . Hyperlipidemia   . Hypertension   . Hypothyroidism   . Lumbar spondylosis   . Lymphedema    seeing specialist for this  . Migraine    "mostly stopped when I changed my diet" (03/03/2017)  . OSA on CPAP   . Pneumonia X 1  . PONV (postoperative nausea and vomiting)   . Restless leg syndrome   . Rheumatoid arthritis ([place])   . Stroke [facility])    TIAs "mini strokes"- unsure of last TIA - pt and daughter deny this  .  Syncope   . Tremors of nervous system   . Type II diabetes mellitus ([place])     Past Surgical History:  Procedure Laterality Date  . APPENDECTOMY  1966  . BACK SURGERY    . CARDIAC CATHETERIZATION N/A 05/25/2016   Procedure: Right/Left Heart Cath and Coronary Angiography;  Surgeon: Larey Dresser, MD;  Location: [place] CV LAB;  Service: Cardiovascular;  Laterality: N/A;  . CATARACT EXTRACTION W/ INTRAOCULAR LENS  IMPLANT, BILATERAL Bilateral   . COLONOSCOPY    . CORONARY ANGIOGRAM  2009   Normal coronaries  . EXCISION/RELEASE BURSA HIP Bilateral   . I & D KNEE WITH POLY EXCHANGE Left 03/02/2017   Procedure: Left knee Revision of poly liner;  Surgeon: Mcarthur Rossetti, MD;  Location: [place];  Service: Orthopedics;  Laterality: Left;  . JOINT REPLACEMENT    . KNEE ARTHROSCOPY Bilateral   . LAPAROSCOPIC CHOLECYSTECTOMY  2015  . LMF  2017   CardiMEMS HF implant for CHF (measures amount of fluid in the heart)  . LUMBAR DISC SURGERY    . LUMBAR LAMINECTOMY/DECOMPRESSION MICRODISCECTOMY Left  12/2005   L2-3 laminectomy and diskectomy/notes 01/06/2011  . POSTERIOR LUMBAR FUSION  04/2006   [name] 01/06/2011; "put cages in"  . REVERSE SHOULDER ARTHROPLASTY Left 06/15/2017   Procedure: REVERSE LEFT SHOULDER ARTHROPLASTY;  Surgeon: Meredith Pel, MD;  Location: [place];  Service: Orthopedics;  Laterality: Left;  . REVERSE SHOULDER ARTHROPLASTY Right 02/01/2018  . REVERSE SHOULDER ARTHROPLASTY Right 02/01/2018   Procedure: RIGHT REVERSE SHOULDER ARTHROPLASTY;  Surgeon: Meredith Pel, MD;  Location: [place];  Service: Orthopedics;  Laterality: Right;  . REVISION TOTAL KNEE ARTHROPLASTY Left 03/02/2017   poly liner/notes 03/02/2017  . RIGHT HEART CATH N/A 11/13/2016   Procedure: Right Heart Cath with Cardiomems;  Surgeon: Larey Dresser, MD;  Location: [place] CV LAB;  Service: Cardiovascular;  Laterality: N/A;  . SHOULDER ARTHROSCOPY WITH ROTATOR CUFF REPAIR Right   . SHOULDER  OPEN ROTATOR CUFF REPAIR Left 01/2011   [name] 01/29/2011  . TOTAL ABDOMINAL HYSTERECTOMY    . TOTAL KNEE ARTHROPLASTY Left   . TOTAL KNEE ARTHROPLASTY Right 11/18/2012   Procedure: TOTAL KNEE ARTHROPLASTY;  Surgeon: Yvette Rack., MD;  Location: [place];  Service: Orthopedics;  Laterality: Right;  . TUBAL LIGATION    . UPPER GASTROINTESTINAL ENDOSCOPY    . UPPER GI ENDOSCOPY      There were no vitals filed for this visit.   Subjective Assessment - 05/23/20 1342    Subjective  [name] presents to OT for Rx visit 44/72 to address BLE lipo-lymphedema (I89.0). Pt presents with LLE compression stocking in place, but incorrectly positioned. Pt reports staff at SNF do not follow her directions for donning    using assistive devices, and she has to reposition garments most days. Pt expresses frustration that only one staff memeber is able to don garment correctly and effectively.    Pertinent History chronic BLE leg swelling and associated pain ~ 40 years; hx herniated intervertebral discL TKA; L total knee revision; HTN, Obesity. dementia; restless leg; CHF; unstable anina; R RC arthroplasty, OA, cervical spondylosis with myelopathy, chronic lower back pain, CKD (Stage?); Fibromyalgia; Hypothyroidism; OSA ( cPap?); hx pneumonia; RA, Type II Diabetes. S/P cardiac cathg, B THA, Lumbar laminectomy, posterior lumbar fusion, B shoulder arthroplasty; total abdominal hysterectomy    Limitations chronic leg swelling and associated pain, difficulty walking, impaired dynamic balance, muscle weakness,    Repetition Increases Symptoms    Special Tests + STEMMER sign base of toes bilaterally                        OT Treatments/Exercises (OP) - 05/23/20 0001      ADLs   ADL Education Given Yes      Manual Therapy   Manual Therapy Edema management;Manual Lymphatic Drainage (MLD);Compression Bandaging    Manual Lymphatic Drainage (MLD)  MLD to RLE utilizing modified "short neck sequence" with no  strokes to neck in keeping w' LE thyroid precautions. UTILIZED DEEP ABDOMINAL PATHWAYS VIS DIAPHRAGMATIC BREATHING, FUNCTIONAL INGUINAL ln, AND PROGIDED DYNAMIC STROKES TO LEG IN SEGMENTS FROM PROXIMAL TO DISTAL. eXCELLENT TOLERANCE.    Compression Bandaging Max A to doff and re-don LLE garment for correct positioning. RLE knee length gradient wraps as established. Jobst RELAX to LLE.                   OT Education - 05/23/20 1341    Education Details Reviewed optimal garment donning again so she can direct caregivers at [facility]  Person(s) Educated Patient    Methods Explanation;Demonstration;Tactile cues;Verbal cues    Comprehension Verbalized understanding;Returned demonstration;Need further instruction;Verbal cues required;Tactile cues required               OT Long Term Goals - 05/13/20 1430      OT LONG TERM GOAL #1   Title Pt will be able to apply LLE multi-layer, short stretch compression wraps daily with maximum caregiver assistance, using correct gradient techniques independently to return affected limb/s, as closely as possible, to premorbid size and shape, to limit infection risk, and to improve safe functional mobility and ADLs performance.    Baseline dependent    Time 4    Period Days    Status Achieved   Pt has no assistance at SNF for compression wrapping. Pt launders and rolls bandages independently. Pt still requires ongoing verbal cues to lift leg and reposition wheil assistant waraps. 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     OT LONG TERM GOAL #2   Title Pt will be able to verbalize signs and symptoms of cellulitis infection and identify 4 common lymphedema precautions using printed resource for reference (modified independence) to limit LE progression over time.    Baseline Max A    Time 4    Period Days    Status Achieved      OT LONG TERM GOAL #3   Title Pt will sustain than 85% compliance with all daily LE self-care home program components  throughout Intensive Phase CDT, including impeccable skin care, lymphatic pumping therex,  compression wraps and simple self MLD, to ensure optimal limb volume reduction, to limit infection risk and to limit LE progression.    Baseline dependent    Time 12    Period Weeks    Status Achieved   Pt has achieved ~ 75% compliance with LE self care home program. Pt unableto reapply wraps between visits if she has to remove them     .     OT LONG TERM GOAL #4   Title Pt to achieve at least 10% LLE limb volume reduction during Intensive Phase CDT with Max CG assistance for wrapping, to improve functional performance of basic and instrumental ADLs, and to limit LE progression.    Baseline dependent    Time 12    Period Weeks    Status Partially Met   Met for all LLE segments and for full leg. Reduction goal for overall reduction of LLE limb volume to 20%. Now that RLE cellulitis is under better control we commenced CDT toLLE. By visual assessment would estimate ~  10% reduction thus far.     OT LONG TERM GOAL #5   Title During self-management phase of CDT Pt will retain limb volume reductions achieved during Intensive Phase CDT with no more than 3% volume increase using all LE self care components  to limit LE progression and further functional decline.    Baseline dependent    Time 6    Period Months    Status On-going                 Plan - 05/23/20 1349    Clinical Impression Statement Pt tolerated MLD, skin care and compression wraps to RLE today withoiut difficulty. SNF staff continue to refuse to use recommended assistive devices to assist her and continue to don custom LLE garment incorrectly. With only one staff member donning garents correctly, Pt is not getting the full benefit of the compression  that is customized for her, and lymphedema will consequently progress with ineffective garment use. Will recommend to [name] that she ask her daughter to join her in reaching out to [facility]  administrator for help with staff training to ensure optimal lymphedema management over time . Cont as per POC.    Occupational performance deficits (Please refer to evaluation for details): ADL's;IADL's;Rest and Sleep;Work;Leisure;Social Participation    Rehab Potential Good    Clinical Decision Making Multiple treatment options, significant modification of task necessary    Comorbidities Affecting Occupational Performance: Presence of comorbidities impacting occupational performance    Modification or Assistance to Complete Evaluation  Min-Moderate modification of tasks or assist with assess necessary to complete eval    OT Treatment/Interventions Self-care/ADL training;Therapeutic exercise;Functional Mobility Training;Manual Therapy;Energy conservation;Manual lymph drainage;Therapeutic activities;Coping strategies training;DME and/or AE instruction;Compression bandaging;Patient/family education    Plan Complete Deconhgestive Therapy (CDT) Intensive and Self -Management phases, consisting of Manual lymphatic Drainage (MLD), skin care, ther ex and compression bandaging    OT Home Exercise Plan lymphatic pumping therex 2-3 x q day, in sequence, 10 reps bilaterally    Recommended Other Services Daytime: 2 Pr. Jobst EVAREX custom, ccl 2, A-D, 2.5 cm SB at oblique top edge, open toe, t heel.  HOS: Jobst RELAX custom ccl 2 A-D, OT w. posterior zipper           Patient will benefit from skilled therapeutic intervention in order to improve the following deficits and impairments:           Visit Diagnosis: Lymphedema, not elsewhere classified    Problem List Patient Active Problem List   Diagnosis Date Noted  . Iron deficiency 08/06/2018  . History of total left knee replacement (TKR) 07/18/2018  . Rectal bleeding 07/05/2018  . Hemorrhoids 07/05/2018  . Anal itching 07/05/2018  . Rotator cuff arthropathy of right shoulder   . Shoulder arthritis 06/15/2017  . Status post revision of total  knee replacement, left 04/29/2017  . Polyethylene liner wear following left total knee arthroplasty requiring isolated polyethylene liner exchange ([place]) 03/02/2017  . 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 Unstable angina ([place]) 05/21/2016  . Chronic diastolic heart failure ([place]) 03/29/2016  . Normal coronary arteries 2009 01/14/2015  . Obesity-BMI 40 01/14/2015  . Restless leg 01/14/2015  . Chest pain 01/14/2015  . Back pain 01/14/2015  . Renal insufficiency 01/14/2015  . Dementia- mild memory issues 01/14/2015  . Occult blood positive stool 12/14/2014  . Anemia 12/14/2014  . Family history of colon cancer 12/14/2014  . Change in bowel habits 12/14/2014  . GERD (gastroesophageal reflux disease) 12/14/2014  . Dyspnea 05/17/2012  . Lymphedema 05/17/2012  . Weight gain 05/17/2012  . HTN (hypertension) 05/17/2012    Andrey Spearman, MS, OTR/L, [facility] 05/23/20 1:54 PM   [place] MAIN [facility] SERVICES [address] [place], [place], [address] Phone: [phone]   Fax:  [phone]  Name: [name] MRN: [id number] Date of Birth: 12/13/1945

## 2020-05-24 ENCOUNTER — Encounter (HOSPITAL_COMMUNITY): Payer: Medicare Other | Admitting: Internal Medicine

## 2020-05-24 ENCOUNTER — Ambulatory Visit: Payer: Medicare Other | Admitting: Occupational Therapy

## 2020-05-26 NOTE — Progress Notes (Signed)
Advanced Heart Failure Clinic Note   Primary Care: Dr. Edrick Oh Renal: Dr Joelyn Oms HF Cardiology: Dr. Aundra Dubin  HPI: [name] is a 74 y.o. with history of chronic diastolic CHF (Echo 0/17/49 LVEF 65%, Mild LAE), chronic lymphedema, chronic back pain, HTN, CKD stage 3, and fibromyalgia.    Admitted several times in 02/2016 to [facility] with CHF and chest pain.  Has been tried on spironolactone + metolazone alone as diuretic regimen. Follows with renal as above.   She was admitted in 9/17 with chest pain and AKI.  She had seen nephrology and was given metolazone to take daily along with Lasix, creatinine rose considerably.  Lasix was decreased to 40 mg bid.  When creatinine improved, she had right and left heart cath showing nonobstructive CAD and near-normal filling pressures.  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    Had Cardiomems placed on 11/13/16. PA diastolic 9 mmHg at implant time.   Today she returns for HF follow up.Overall feeling fine. Denies SOB/PND/Orthopnea. Appetite ok. No fever or chills. Weight at home 261 pounds. Taking all medications provided at the SNF. Now at SNF in [place]--> [facility].  PAD Goal 8--->Reading today 13   Labs (9/17): K 4.6, creatinine 1.55, BNP 63 Labs (10/17): K 4.7, creatinine 1.8 Labs ( 09/06/2016): K 3.7 Creatinine 2.05  Labs (1/18): K 5.1, creatinine 2.03 Labs (2/18): hgb 12, K 4.3, creatinine 1.87 Labs (11/13/2016) K 3.8 Creatinine 2.12  Labs (10/18): K 3.7, creatinine 1.6 Labs (1/19): K 4, creatinine 1.46 Labs (01/26/2018): K 4.1 Creatinine 1.47  Labs ( 08/31/19): K 4.1 Creatinine 0.91   Review of systems complete and found to be negative unless listed in HPI.    Past Medical History 1. Chronic diastolic CHF - Echo 4/49/67 LVEF 65%, Mild LAE - RHC (10/17): mean RA 4, PA 25/11, mean PCWP 5, CI 2.4.  - Echo (2/18): EF 55-60%, mild LVH, normal RV size and systolic function. -  Cardiomems implanted 3/18.  2. CKD stage 3: Follows with nephology. Baseline appears to be creatinine 1.4-1.6.  3. Chronic lymphedema: Has had wraps.  4. HTN 5. Dementia 6. Fibromyalgia 7. Microvascular angina:  - Normal coronaries on Cath 2009 - Stress Myoview 04/09/16 with no ischemia - LHC (10/17) with mild nonobstructive disease.  8. Restless leg syndrome 9. H/o shoulder replacement.   FH: Mother with CHF, brother with "heart disease."  Another brother with h/o MI.   Current Outpatient Medications  Medication Sig Dispense Refill  . acetaminophen (TYLENOL) 650 MG CR tablet Take 650 mg by mouth every 4 (four) hours as needed for pain.    Marland Kitchen amLODipine (NORVASC) 2.5 MG tablet Take 2.5 mg by mouth daily.    Marland Kitchen antiseptic oral rinse (BIOTENE) LIQD 10 mLs by Mouth Rinse route 4 (four) times daily. (0900, 1000, 1200 & 2100)    . aspirin EC 81 MG tablet Take 1 tablet (81 mg total) by mouth daily. 90 tablet 3  . atorvastatin (LIPITOR) 40 MG tablet Take 40 mg by mouth daily.    . baclofen (LIORESAL) 10 MG tablet Take 10 mg by mouth 3 (three) times daily. (0900, 1400, & 2100)    . budesonide (PULMICORT) 0.5 MG/2ML nebulizer solution Take 0.5 mg by nebulization 2 (two) times daily. (1000 & 2200)    . carvedilol (COREG) 6.25 MG tablet Take 6.25 mg by mouth 2 (two) times daily with a meal.    . Cholecalciferol (VITAMIN D3)  1000 units CAPS Take 1,000 Units by mouth daily. (0900)    . Cyanocobalamin (VITAMIN B-12 IJ) Inject 1,000 mcg as directed every 30 (thirty) days. Weekly for 4 weeks, has 1 more dose this week (week of 7/2) then will switch to once a month     . docusate sodium (COLACE) 100 MG capsule Take 100 mg by mouth 2 (two) times daily.    . DULoxetine (CYMBALTA) 30 MG capsule Take 30 mg by mouth daily. (0900)    . erythromycin ophthalmic ointment 1 application at bedtime.    . gabapentin (NEURONTIN) 300 MG capsule Take 400 mg by mouth 2 (two) times daily. (0900 & 2100)    .  HYDROcodone-acetaminophen (NORCO) 7.5-325 MG tablet Take 1 tablet by mouth every 6 (six) hours as needed.     Marland Kitchen ipratropium-albuterol (DUONEB) 0.5-2.5 (3) MG/3ML SOLN Take 3 mLs by nebulization every 6 (six) hours as needed (FOR WHEEZING/SHORTNESS OF BREATH).    Marland Kitchen lamoTRIgine (LAMICTAL) 100 MG tablet Take 100 mg by mouth 2 (two) times daily. (0900 & 2100)    . levothyroxine (SYNTHROID, LEVOTHROID) 88 MCG tablet Take 88 mcg by mouth daily before breakfast. (0900)    . loratadine (CLARITIN) 10 MG tablet Take 10 mg by mouth daily.    . Melatonin 3 MG CAPS Take 3 mg by mouth at bedtime. (2100)    . omeprazole (PRILOSEC) 20 MG capsule Take 20 mg by mouth 2 (two) times daily before a meal. (0900 & 1700)    . rOPINIRole (REQUIP) 2 MG tablet Take 2 mg by mouth 3 (three) times daily.    . sennosides-docusate sodium (SENOKOT-S) 8.6-50 MG tablet Take 1 tablet by mouth daily.    . sucralfate (CARAFATE) 1 GM/10ML suspension Take 1 g by mouth 4 (four) times daily -  with meals and at bedtime.    . torsemide (DEMADEX) 20 MG tablet Take 80 mg by mouth daily.    . traZODone (DESYREL) 50 MG tablet Take 50 mg by mouth at bedtime. (2100)     No current facility-administered medications for this encounter.   No Known Allergies  Social History   Socioeconomic History  . Marital status: Widowed    Spouse name: Not on file  . Number of children: 3  . Years of education: Not on file  . Highest education level: Not on file  Occupational History  . Occupation: retired  Tobacco Use  . Smoking status: Never Smoker  . Smokeless tobacco: Never Used  Vaping Use  . Vaping Use: Never used  Substance and Sexual Activity  . Alcohol use: No    Alcohol/week: 0.0 standard drinks  . Drug use: No  . Sexual activity: Not Currently  Other Topics Concern  . Not on file  Social History Narrative   Pt does use caffeine. Lives alone. 3 children. Retired.   Social Determinants of Health   Financial Resource Strain:   .  Difficulty of Paying Living Expenses: Not on file  Food Insecurity:   . Worried About Charity fundraiser in the Last Year: Not on file  . Ran Out of Food in the Last Year: Not on file  Transportation Needs:   . Lack of Transportation (Medical): Not on file  . Lack of Transportation (Non-Medical): Not on file  Physical Activity:   . Days of Exercise per Week: Not on file  . Minutes of Exercise per Session: Not on file  Stress:   . Feeling of Stress : Not  on file  Social Connections:   . Frequency of Communication with Friends and Family: Not on file  . Frequency of Social Gatherings with Friends and Family: Not on file  . Attends Religious Services: Not on file  . Active Member of Clubs or Organizations: Not on file  . Attends Archivist Meetings: Not on file  . Marital Status: Not on file  Intimate Partner Violence:   . Fear of Current or Ex-Partner: Not on file  . Emotionally Abused: Not on file  . Physically Abused: Not on file  . Sexually Abused: Not on file   Vitals:   05/27/20 1153  BP: 115/75  Pulse: 63  SpO2: 95%  Weight: 118.4 kg (261 lb)   Wt Readings from Last 3 Encounters:  05/27/20 118.4 kg (261 lb)  10/30/19 117.1 kg (258 lb 2 oz)  08/31/19 119.3 kg (263 lb)   PHYSICAL EXAM: General:  Arrived in a wheel chair.  No resp difficulty HEENT: normal Neck: supple. no JVD. Carotids 2+ bilat; no bruits. No lymphadenopathy or thryomegaly appreciated. Cor: PMI nondisplaced. Regular rate & rhythm. No rubs, gallops or murmurs. Lungs: clear Abdomen: obese, soft, nontender, nondistended. No hepatosplenomegaly. No bruits or masses. Good bowel sounds. Extremities: no cyanosis, clubbing, rash, edema Neuro: alert & orientedx3, cranial nerves grossly intact. moves all 4 extremities w/o difficulty. Affect pleasant  ASSESSMENT & PLAN: 1. Chronic diastolic CHF:  Echo (6/38) with EF 55-60%, normal RV.  Has Cardiomems. PADP at goal at 9 mmHg today Todays Cardiomems  reading is -->PAD today 13   NYHA II. -Volume status elevated. Increase torsemide to 100 mg daily x 2 days then back to 80 mg daily.  - Continue  ASA 81 mg daily  - Discussed low salt food choices.  -Check BMET 2. Lymphedema: Continue lymph edema pumps.    3. CKD: Stage 3 -Check BMET  4. Chest pain: LHC 2017 Nonobstructive.   No chest pain.    5. HTN:  Stable no change.  6. DM2:  Per PCP 7. Obesity-Body mass index is 42.13 kg/m. Discussed portion control.   Follow up 6 months.      Darrick Grinder, NP  05/27/2020

## 2020-05-27 ENCOUNTER — Ambulatory Visit (HOSPITAL_COMMUNITY)
Admission: RE | Admit: 2020-05-27 | Discharge: 2020-05-27 | Disposition: A | Discharge status: Home / Self Care | Payer: Medicare Other | Source: Ambulatory Visit | Attending: Adult Health | Admitting: Adult Health

## 2020-05-27 ENCOUNTER — Other Ambulatory Visit: Payer: Self-pay

## 2020-05-27 VITALS — BP 115/75 | HR 63 | Wt 261.0 lb

## 2020-05-27 DIAGNOSIS — R079 Chest pain, unspecified: Secondary | ICD-10-CM | POA: Diagnosis not present

## 2020-05-27 DIAGNOSIS — G2581 Restless legs syndrome: Secondary | ICD-10-CM | POA: Diagnosis not present

## 2020-05-27 DIAGNOSIS — I5032 Chronic diastolic (congestive) heart failure: Secondary | ICD-10-CM

## 2020-05-27 DIAGNOSIS — Z7901 Long term (current) use of anticoagulants: Secondary | ICD-10-CM | POA: Insufficient documentation

## 2020-05-27 DIAGNOSIS — E669 Obesity, unspecified: Secondary | ICD-10-CM | POA: Insufficient documentation

## 2020-05-27 DIAGNOSIS — M797 Fibromyalgia: Secondary | ICD-10-CM | POA: Diagnosis not present

## 2020-05-27 DIAGNOSIS — I130 Hypertensive heart and chronic kidney disease with heart failure and stage 1 through stage 4 chronic kidney disease, or unspecified chronic kidney disease: Secondary | ICD-10-CM | POA: Diagnosis not present

## 2020-05-27 DIAGNOSIS — I2510 Atherosclerotic heart disease of native coronary artery without angina pectoris: Secondary | ICD-10-CM | POA: Insufficient documentation

## 2020-05-27 DIAGNOSIS — Z7982 Long term (current) use of aspirin: Secondary | ICD-10-CM | POA: Insufficient documentation

## 2020-05-27 DIAGNOSIS — N183 Chronic kidney disease, stage 3 unspecified: Secondary | ICD-10-CM | POA: Insufficient documentation

## 2020-05-27 DIAGNOSIS — F0390 Unspecified dementia without behavioral disturbance: Secondary | ICD-10-CM | POA: Insufficient documentation

## 2020-05-27 DIAGNOSIS — Z79899 Other long term (current) drug therapy: Secondary | ICD-10-CM | POA: Insufficient documentation

## 2020-05-27 DIAGNOSIS — I890 Lymphedema, not elsewhere classified: Secondary | ICD-10-CM | POA: Diagnosis not present

## 2020-05-27 DIAGNOSIS — E1122 Type 2 diabetes mellitus with diabetic chronic kidney disease: Secondary | ICD-10-CM | POA: Insufficient documentation

## 2020-05-27 DIAGNOSIS — Z6841 Body Mass Index (BMI) 40.0 and over, adult: Secondary | ICD-10-CM | POA: Insufficient documentation

## 2020-05-27 DIAGNOSIS — I10 Essential (primary) hypertension: Secondary | ICD-10-CM

## 2020-05-27 DIAGNOSIS — Z8249 Family history of ischemic heart disease and other diseases of the circulatory system: Secondary | ICD-10-CM | POA: Diagnosis not present

## 2020-05-27 LAB — BASIC METABOLIC PANEL
Anion gap: 10 (ref 5–15)
BUN: 37 mg/dL — ABNORMAL HIGH (ref 8–23)
CO2: 30 mmol/L (ref 22–32)
Calcium: 10.2 mg/dL (ref 8.9–10.3)
Chloride: 101 mmol/L (ref 98–111)
Creatinine, Ser: 1.52 mg/dL — ABNORMAL HIGH (ref 0.44–1.00)
GFR calc Af Amer: 38 mL/min — ABNORMAL LOW (ref >60)
GFR calc non Af Amer: 33 mL/min — ABNORMAL LOW (ref >60)
Glucose, Bld: 107 mg/dL — ABNORMAL HIGH (ref 70–99)
Potassium: 4.1 mmol/L (ref 3.5–5.1)
Sodium: 141 mmol/L (ref 135–145)

## 2020-05-27 NOTE — Patient Instructions (Signed)
INCREASE Torsemide 100mg  for 2 DAYS, then DECREASE back to 80mg  daily  Labs done today, your results will be available in MyChart, we will contact you for abnormal readings.  Your physician recommends that you repeat labs in 7-10 days. You can have your facility draw them and fax over the results.  Please call our office in March 2022 to schedule your follow up appointment  If you have any questions or concerns before your next appointment please send [place] a message through [place] or call our office at [phone].    TO LEAVE A MESSAGE FOR THE NURSE SELECT OPTION 2, PLEASE LEAVE A MESSAGE INCLUDING: . YOUR NAME . DATE OF BIRTH . CALL BACK NUMBER . REASON FOR CALL**this is important as we prioritize the call backs  YOU WILL RECEIVE A CALL BACK THE SAME DAY AS LONG AS YOU CALL BEFORE 4:00 PM

## 2020-05-28 ENCOUNTER — Ambulatory Visit: Payer: Medicare Other | Attending: Family Medicine | Admitting: Occupational Therapy

## 2020-05-28 DIAGNOSIS — I890 Lymphedema, not elsewhere classified: Secondary | ICD-10-CM | POA: Diagnosis present

## 2020-05-28 NOTE — Therapy (Signed)
[place] MAIN [facility] SERVICES [address] [place], [place], [address] Phone: [phone]   Fax:  [phone]  Occupational Therapy Treatment  Patient Details  Name: [name] MRN: [id number] Date of Birth: Sep 15, 1945 Referring Provider (OT): Juluis Pitch, MD   Encounter Date: 05/28/2020   OT End of Session - 05/28/20 1230    Visit Number 45    Number of Visits 72    Date for OT Re-Evaluation 08/01/20    OT Start Time 1120    OT Stop Time 1220    OT Time Calculation (min) 60 min    Equipment Utilized During Treatment friction gloves and tyvek slipper    Activity Tolerance Patient tolerated treatment well;No increased pain    Behavior During Therapy WFL for tasks assessed/performed           Past Medical History:  Diagnosis Date  . Anemia   . Anxiety   . Arthritis    "hands, feet" (03/03/2017)  . Brain lesion    2 types  . Cataract   . Cervical spondylosis with myelopathy   . Chest pain    Normal cardiac cath 5/09  . Chronic lower back pain   . CKD (chronic kidney disease), stage III    [name]  . Congestive heart failure (CHF) ([place])    has a Cardiomems implant   . Constipation   . Dementia ([place])   . Depression   . Dumping syndrome   . Dyspnea    with activity  . Edema   . Fatigue   . Fibromyalgia   . GERD (gastroesophageal reflux disease)   . Headache    "w/high CBG" (03/03/2017)  . History of echocardiogram    a. Echo 03/20/16 (done at [facility] in [place], [place]):  mild LVH, EF 01%, normal diastolic function, mild LAE, MAC, RVSP 25 mmHg  . Hyperlipidemia   . Hypertension   . Hypothyroidism   . Lumbar spondylosis   . Lymphedema    seeing specialist for this  . Migraine    "mostly stopped when I changed my diet" (03/03/2017)  . OSA on CPAP   . Pneumonia X 1  . PONV (postoperative nausea and vomiting)   . Restless leg syndrome   . Rheumatoid arthritis ([place])   . Stroke [facility])     TIAs "mini strokes"- unsure of last TIA - pt and daughter deny this  . Syncope   . Tremors of nervous system   . Type II diabetes mellitus ([place])     Past Surgical History:  Procedure Laterality Date  . APPENDECTOMY  1966  . BACK SURGERY    . CARDIAC CATHETERIZATION N/A 05/25/2016   Procedure: Right/Left Heart Cath and Coronary Angiography;  Surgeon: Larey Dresser, MD;  Location: [place] CV LAB;  Service: Cardiovascular;  Laterality: N/A;  . CATARACT EXTRACTION W/ INTRAOCULAR LENS  IMPLANT, BILATERAL Bilateral   . COLONOSCOPY    . CORONARY ANGIOGRAM  2009   Normal coronaries  . EXCISION/RELEASE BURSA HIP Bilateral   . I & D KNEE WITH POLY EXCHANGE Left 03/02/2017   Procedure: Left knee Revision of poly liner;  Surgeon: Mcarthur Rossetti, MD;  Location: [place];  Service: Orthopedics;  Laterality: Left;  . JOINT REPLACEMENT    . KNEE ARTHROSCOPY Bilateral   . LAPAROSCOPIC CHOLECYSTECTOMY  2015  . LMF  2017   CardiMEMS HF implant for CHF (measures amount of fluid in the heart)  .  LUMBAR DISC SURGERY    . LUMBAR LAMINECTOMY/DECOMPRESSION MICRODISCECTOMY Left 12/2005   L2-3 laminectomy and diskectomy/notes 01/06/2011  . POSTERIOR LUMBAR FUSION  04/2006   [name] 01/06/2011; "put cages in"  . REVERSE SHOULDER ARTHROPLASTY Left 06/15/2017   Procedure: REVERSE LEFT SHOULDER ARTHROPLASTY;  Surgeon: Meredith Pel, MD;  Location: [place];  Service: Orthopedics;  Laterality: Left;  . REVERSE SHOULDER ARTHROPLASTY Right 02/01/2018  . REVERSE SHOULDER ARTHROPLASTY Right 02/01/2018   Procedure: RIGHT REVERSE SHOULDER ARTHROPLASTY;  Surgeon: Meredith Pel, MD;  Location: [place];  Service: Orthopedics;  Laterality: Right;  . REVISION TOTAL KNEE ARTHROPLASTY Left 03/02/2017   poly liner/notes 03/02/2017  . RIGHT HEART CATH N/A 11/13/2016   Procedure: Right Heart Cath with Cardiomems;  Surgeon: Larey Dresser, MD;  Location: [place] [facility];  Service: Cardiovascular;  Laterality:  N/A;  . SHOULDER ARTHROSCOPY WITH ROTATOR CUFF REPAIR Right   . SHOULDER OPEN ROTATOR CUFF REPAIR Left 01/2011   [name] 01/29/2011  . TOTAL ABDOMINAL HYSTERECTOMY    . TOTAL KNEE ARTHROPLASTY Left   . TOTAL KNEE ARTHROPLASTY Right 11/18/2012   Procedure: TOTAL KNEE ARTHROPLASTY;  Surgeon: Yvette Rack., MD;  Location: [place];  Service: Orthopedics;  Laterality: Right;  . TUBAL LIGATION    . UPPER GASTROINTESTINAL ENDOSCOPY    . UPPER GI ENDOSCOPY      There were no vitals filed for this visit.   Subjective Assessment - 05/28/20 1128    Subjective  [name] presents to OT for Rx visit 45/72 to address BLE lipo-lymphedema (I89.0). Pt presents with LLE compression stocking in place on LLE. Pt is less animated today and more sleepy throughout session than is typical. Pt reports she missed her morning medications. Pt denies leg pain associated w LE.    Pertinent History chronic BLE leg swelling and associated pain ~ 40 years; hx herniated intervertebral discL TKA; L total knee revision; HTN, Obesity. dementia; restless leg; CHF; unstable anina; R RC arthroplasty, OA, cervical spondylosis with myelopathy, chronic lower back pain, CKD (Stage?); Fibromyalgia; Hypothyroidism; OSA ( cPap?); hx pneumonia; RA, Type II Diabetes. S/P cardiac cathg, B THA, Lumbar laminectomy, posterior lumbar fusion, B shoulder arthroplasty; total abdominal hysterectomy    Limitations chronic leg swelling and associated pain, difficulty walking, impaired dynamic balance, muscle weakness,    Repetition Increases Symptoms    Special Tests + STEMMER sign base of toes bilaterally                        OT Treatments/Exercises (OP) - 05/28/20 0001      ADLs   ADL Education Given Yes      Manual Therapy   Manual Therapy Edema management;Manual Lymphatic Drainage (MLD);Compression Bandaging    Manual Lymphatic Drainage (MLD)  MLD to RLE utilizing modified "short neck sequence" with no strokes to neck in keeping  w' LE thyroid precautions. UTILIZED DEEP ABDOMINAL PATHWAYS VIS DIAPHRAGMATIC BREATHING, FUNCTIONAL INGUINAL ln, AND PROGIDED DYNAMIC STROKES TO LEG IN SEGMENTS FROM PROXIMAL TO DISTAL. eXCELLENT TOLERANCE.    Compression Bandaging Max A to doff and re-don LLE garment for correct positioning. RLE knee length gradient wraps as established. Jobst RELAX to LLE.                   OT Education - 05/28/20 1234    Education Details Continued skilled Pt/caregiver education  And LE ADL training throughout visit for lymphedema self care/ home program, including compression  wrapping, compression garment and device wear/care, lymphatic pumping ther ex, simple self-MLD, and skin care. Discussed progress towards goals.    Person(s) Educated Patient    Methods Explanation;Demonstration;Tactile cues;Verbal cues    Comprehension Verbalized understanding;Returned demonstration;Need further instruction               OT Long Term Goals - 05/13/20 1430      OT LONG TERM GOAL #1   Title Pt will be able to apply LLE multi-layer, short stretch compression wraps daily with maximum caregiver assistance, using correct gradient techniques independently to return affected limb/s, as closely as possible, to premorbid size and shape, to limit infection risk, and to improve safe functional mobility and ADLs performance.    Baseline dependent    Time 4    Period Days    Status Achieved   Pt has no assistance at SNF for compression wrapping. Pt launders and rolls bandages independently. Pt still requires ongoing verbal cues to lift leg and reposition wheil assistant waraps. 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     OT LONG TERM GOAL #2   Title Pt will be able to verbalize signs and symptoms of cellulitis infection and identify 4 common lymphedema precautions using printed resource for reference (modified independence) to limit LE progression over time.    Baseline Max A    Time 4    Period Days    Status  Achieved      OT LONG TERM GOAL #3   Title Pt will sustain than 85% compliance with all daily LE self-care home program components throughout Intensive Phase CDT, including impeccable skin care, lymphatic pumping therex,  compression wraps and simple self MLD, to ensure optimal limb volume reduction, to limit infection risk and to limit LE progression.    Baseline dependent    Time 12    Period Weeks    Status Achieved   Pt has achieved ~ 75% compliance with LE self care home program. Pt unableto reapply wraps between visits if she has to remove them     .     OT LONG TERM GOAL #4   Title Pt to achieve at least 10% LLE limb volume reduction during Intensive Phase CDT with Max CG assistance for wrapping, to improve functional performance of basic and instrumental ADLs, and to limit LE progression.    Baseline dependent    Time 12    Period Weeks    Status Partially Met   Met for all LLE segments and for full leg. Reduction goal for overall reduction of LLE limb volume to 20%. Now that RLE cellulitis is under better control we commenced CDT toLLE. By visual assessment would estimate ~  10% reduction thus far.     OT LONG TERM GOAL #5   Title During self-management phase of CDT Pt will retain limb volume reductions achieved during Intensive Phase CDT with no more than 3% volume increase using all LE self care components  to limit LE progression and further functional decline.    Baseline dependent    Time 6    Period Months    Status On-going                 Plan - 05/28/20 1234    Clinical Impression Statement LLE swelling and tissue density is increased below knee today .RLE garment better positioned by staff this morning, but needed adjustment to distribute fabric proximally for optimal gradient. MLD and RLE compression wraps well tolerated.  Cont per POC.    Occupational performance deficits (Please refer to evaluation for details): ADL's;IADL's;Rest and Sleep;Work;Leisure;Social  Participation    Rehab Potential Good    Clinical Decision Making Multiple treatment options, significant modification of task necessary    Comorbidities Affecting Occupational Performance: Presence of comorbidities impacting occupational performance    Modification or Assistance to Complete Evaluation  Min-Moderate modification of tasks or assist with assess necessary to complete eval    OT Treatment/Interventions Self-care/ADL training;Therapeutic exercise;Functional Mobility Training;Manual Therapy;Energy conservation;Manual lymph drainage;Therapeutic activities;Coping strategies training;DME and/or AE instruction;Compression bandaging;Patient/family education    Plan Complete Deconhgestive Therapy (CDT) Intensive and Self -Management phases, consisting of Manual lymphatic Drainage (MLD), skin care, ther ex and compression bandaging    OT Home Exercise Plan lymphatic pumping therex 2-3 x q day, in sequence, 10 reps bilaterally    Recommended Other Services Daytime: 2 Pr. Jobst EVAREX custom, ccl 2, A-D, 2.5 cm SB at oblique top edge, open toe, t heel.  HOS: Jobst RELAX custom ccl 2 A-D, OT w. posterior zipper           Patient will benefit from skilled therapeutic intervention in order to improve the following deficits and impairments:           Visit Diagnosis: Lymphedema, not elsewhere classified    Problem List Patient Active Problem List   Diagnosis Date Noted  . Iron deficiency 08/06/2018  . History of total left knee replacement (TKR) 07/18/2018  . Rectal bleeding 07/05/2018  . Hemorrhoids 07/05/2018  . Anal itching 07/05/2018  . Rotator cuff arthropathy of right shoulder   . Shoulder arthritis 06/15/2017  . Status post revision of total knee replacement, left 04/29/2017  . Polyethylene liner wear following left total knee arthroplasty requiring isolated polyethylene liner exchange ([place]) 03/02/2017  . 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  Unstable angina ([place]) 05/21/2016  . Chronic diastolic heart failure ([place]) 03/29/2016  . Normal coronary arteries 2009 01/14/2015  . Obesity-BMI 40 01/14/2015  . Restless leg 01/14/2015  . Chest pain 01/14/2015  . Back pain 01/14/2015  . Renal insufficiency 01/14/2015  . Dementia- mild memory issues 01/14/2015  . Occult blood positive stool 12/14/2014  . Anemia 12/14/2014  . Family history of colon cancer 12/14/2014  . Change in bowel habits 12/14/2014  . GERD (gastroesophageal reflux disease) 12/14/2014  . Dyspnea 05/17/2012  . Lymphedema 05/17/2012  . Weight gain 05/17/2012  . HTN (hypertension) 05/17/2012    Andrey Spearman, MS, OTR/L, [facility] 05/28/20 12:37 PM  [place] MAIN [facility] SERVICES [address] [place], [place], [address] Phone: [phone]   Fax:  [phone]  Name: JANIYLA LONG MRN: [id number] Date of Birth: 10/31/1945

## 2020-05-29 ENCOUNTER — Encounter: Payer: Medicare Other | Admitting: Occupational Therapy

## 2020-05-30 ENCOUNTER — Ambulatory Visit: Payer: Medicare Other | Admitting: Occupational Therapy

## 2020-05-30 ENCOUNTER — Other Ambulatory Visit: Payer: Self-pay

## 2020-05-30 DIAGNOSIS — I890 Lymphedema, not elsewhere classified: Secondary | ICD-10-CM

## 2020-05-30 NOTE — Therapy (Signed)
[place] MAIN [facility] SERVICES [address] [place], [place], [address] Phone: [phone]   Fax:  [phone]  Occupational Therapy Treatment  Patient Details  Name: [name] MRN: [id number] Date of Birth: Oct 17, 1945 Referring Provider (OT): Juluis Pitch, MD   Encounter Date: 05/30/2020   OT End of Session - 05/30/20 1241    Visit Number 46    Number of Visits 72    Date for OT Re-Evaluation 08/01/20    OT Start Time 1105    OT Stop Time 1225    OT Time Calculation (min) 80 min    Equipment Utilized During Treatment friction gloves and tyvek slipper    Activity Tolerance Patient tolerated treatment well;No increased pain    Behavior During Therapy WFL for tasks assessed/performed           Past Medical History:  Diagnosis Date  . Anemia   . Anxiety   . Arthritis    "hands, feet" (03/03/2017)  . Brain lesion    2 types  . Cataract   . Cervical spondylosis with myelopathy   . Chest pain    Normal cardiac cath 5/09  . Chronic lower back pain   . CKD (chronic kidney disease), stage III    [name]  . Congestive heart failure (CHF) ([place])    has a Cardiomems implant   . Constipation   . Dementia ([place])   . Depression   . Dumping syndrome   . Dyspnea    with activity  . Edema   . Fatigue   . Fibromyalgia   . GERD (gastroesophageal reflux disease)   . Headache    "w/high CBG" (03/03/2017)  . History of echocardiogram    a. Echo 03/20/16 (done at [facility] in [place], [place]):  mild LVH, EF 14%, normal diastolic function, mild LAE, MAC, RVSP 25 mmHg  . Hyperlipidemia   . Hypertension   . Hypothyroidism   . Lumbar spondylosis   . Lymphedema    seeing specialist for this  . Migraine    "mostly stopped when I changed my diet" (03/03/2017)  . OSA on CPAP   . Pneumonia X 1  . PONV (postoperative nausea and vomiting)   . Restless leg syndrome   . Rheumatoid arthritis ([place])   . Stroke [facility])     TIAs "mini strokes"- unsure of last TIA - pt and daughter deny this  . Syncope   . Tremors of nervous system   . Type II diabetes mellitus ([place])     Past Surgical History:  Procedure Laterality Date  . APPENDECTOMY  1966  . BACK SURGERY    . CARDIAC CATHETERIZATION N/A 05/25/2016   Procedure: Right/Left Heart Cath and Coronary Angiography;  Surgeon: Larey Dresser, MD;  Location: [place] [facility];  Service: Cardiovascular;  Laterality: N/A;  . CATARACT EXTRACTION W/ INTRAOCULAR LENS  IMPLANT, BILATERAL Bilateral   . COLONOSCOPY    . CORONARY ANGIOGRAM  2009   Normal coronaries  . EXCISION/RELEASE BURSA HIP Bilateral   . I & D KNEE WITH POLY EXCHANGE Left 03/02/2017   Procedure: Left knee Revision of poly liner;  Surgeon: Mcarthur Rossetti, MD;  Location: [place];  Service: Orthopedics;  Laterality: Left;  . JOINT REPLACEMENT    . KNEE ARTHROSCOPY Bilateral   . LAPAROSCOPIC CHOLECYSTECTOMY  2015  . LMF  2017   CardiMEMS HF implant for CHF (measures amount of fluid in the heart)  .  LUMBAR DISC SURGERY    . LUMBAR LAMINECTOMY/DECOMPRESSION MICRODISCECTOMY Left 12/2005   L2-3 laminectomy and diskectomy/notes 01/06/2011  . POSTERIOR LUMBAR FUSION  04/2006   [name] 01/06/2011; "put cages in"  . REVERSE SHOULDER ARTHROPLASTY Left 06/15/2017   Procedure: REVERSE LEFT SHOULDER ARTHROPLASTY;  Surgeon: Meredith Pel, MD;  Location: [place];  Service: Orthopedics;  Laterality: Left;  . REVERSE SHOULDER ARTHROPLASTY Right 02/01/2018  . REVERSE SHOULDER ARTHROPLASTY Right 02/01/2018   Procedure: RIGHT REVERSE SHOULDER ARTHROPLASTY;  Surgeon: Meredith Pel, MD;  Location: [place];  Service: Orthopedics;  Laterality: Right;  . REVISION TOTAL KNEE ARTHROPLASTY Left 03/02/2017   poly liner/notes 03/02/2017  . RIGHT HEART CATH N/A 11/13/2016   Procedure: Right Heart Cath with Cardiomems;  Surgeon: Larey Dresser, MD;  Location: [place] [facility];  Service: Cardiovascular;  Laterality:  N/A;  . SHOULDER ARTHROSCOPY WITH ROTATOR CUFF REPAIR Right   . SHOULDER OPEN ROTATOR CUFF REPAIR Left 01/2011   [name] 01/29/2011  . TOTAL ABDOMINAL HYSTERECTOMY    . TOTAL KNEE ARTHROPLASTY Left   . TOTAL KNEE ARTHROPLASTY Right 11/18/2012   Procedure: TOTAL KNEE ARTHROPLASTY;  Surgeon: Yvette Rack., MD;  Location: [place];  Service: Orthopedics;  Laterality: Right;  . TUBAL LIGATION    . UPPER GASTROINTESTINAL ENDOSCOPY    . UPPER GI ENDOSCOPY      There were no vitals filed for this visit.   Subjective Assessment - 05/30/20 1236    Subjective  Mrs Kareem presents to OT for Rx visit 46/72 to address BLE lipo-lymphedema (I89.0). Pt presents with LLE compression stocking in place on LLE.Pt has more difficulty w/ sit to supine transfer today b/c legs are noticably more swollen bilaterally.    Pertinent History chronic BLE leg swelling and associated pain ~ 40 years; hx herniated intervertebral discL TKA; L total knee revision; HTN, Obesity. dementia; restless leg; CHF; unstable anina; R RC arthroplasty, OA, cervical spondylosis with myelopathy, chronic lower back pain, CKD (Stage?); Fibromyalgia; Hypothyroidism; OSA ( cPap?); hx pneumonia; RA, Type II Diabetes. S/P cardiac cathg, B THA, Lumbar laminectomy, posterior lumbar fusion, B shoulder arthroplasty; total abdominal hysterectomy    Limitations chronic leg swelling and associated pain, difficulty walking, impaired dynamic balance, muscle weakness,    Repetition Increases Symptoms    Special Tests + STEMMER sign base of toes bilaterally                        OT Treatments/Exercises (OP) - 05/30/20 0001      ADLs   ADL Education Given Yes      Manual Therapy   Manual Therapy Edema management;Manual Lymphatic Drainage (MLD);Compression Bandaging    Manual therapy comments LLE garment "remake" assessment    Manual Lymphatic Drainage (MLD)  MLD to RLE utilizing modified "short neck sequence" with no strokes to neck in  keeping w' LE thyroid precautions. UTILIZED DEEP ABDOMINAL PATHWAYS VIS DIAPHRAGMATIC BREATHING, FUNCTIONAL INGUINAL ln, AND PROGIDED DYNAMIC STROKES TO LEG IN SEGMENTS FROM PROXIMAL TO DISTAL. eXCELLENT TOLERANCE.    Compression Bandaging RLE compression wraps as estab;lished. LLE replacement garment appears to fit well.                   OT Education - 05/30/20 1241    Education Details Continued skilled Pt/caregiver education  And LE ADL training throughout visit for lymphedema self care/ home program, including compression wrapping, compression garment and device wear/care, lymphatic pumping ther ex, simple  self-MLD, and skin care. Discussed progress towards goals.    Person(s) Educated Patient    Methods Explanation;Demonstration;Tactile cues;Verbal cues    Comprehension Verbalized understanding;Returned demonstration;Need further instruction               OT Long Term Goals - 05/13/20 1430      OT LONG TERM GOAL #1   Title Pt will be able to apply LLE multi-layer, short stretch compression wraps daily with maximum caregiver assistance, using correct gradient techniques independently to return affected limb/s, as closely as possible, to premorbid size and shape, to limit infection risk, and to improve safe functional mobility and ADLs performance.    Baseline dependent    Time 4    Period Days    Status Achieved   Pt has no assistance at SNF for compression wrapping. Pt launders and rolls bandages independently. Pt still requires ongoing verbal cues to lift leg and reposition wheil assistant waraps. 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     OT LONG TERM GOAL #2   Title Pt will be able to verbalize signs and symptoms of cellulitis infection and identify 4 common lymphedema precautions using printed resource for reference (modified independence) to limit LE progression over time.    Baseline Max A    Time 4    Period Days    Status Achieved      OT LONG TERM GOAL #3    Title Pt will sustain than 85% compliance with all daily LE self-care home program components throughout Intensive Phase CDT, including impeccable skin care, lymphatic pumping therex,  compression wraps and simple self MLD, to ensure optimal limb volume reduction, to limit infection risk and to limit LE progression.    Baseline dependent    Time 12    Period Weeks    Status Achieved   Pt has achieved ~ 75% compliance with LE self care home program. Pt unableto reapply wraps between visits if she has to remove them     .     OT LONG TERM GOAL #4   Title Pt to achieve at least 10% LLE limb volume reduction during Intensive Phase CDT with Max CG assistance for wrapping, to improve functional performance of basic and instrumental ADLs, and to limit LE progression.    Baseline dependent    Time 12    Period Weeks    Status Partially Met   Met for all LLE segments and for full leg. Reduction goal for overall reduction of LLE limb volume to 20%. Now that RLE cellulitis is under better control we commenced CDT toLLE. By visual assessment would estimate ~  10% reduction thus far.     OT LONG TERM GOAL #5   Title During self-management phase of CDT Pt will retain limb volume reductions achieved during Intensive Phase CDT with no more than 3% volume increase using all LE self care components  to limit LE progression and further functional decline.    Baseline dependent    Time 6    Period Months    Status On-going                 Plan - 05/30/20 1242    Clinical Impression Statement BLE more swollen than is typical today. BLE swelling increase is indicative to increased system fluid retention vs pure lymphedema. Pt does not demonstrate increased SOB or wheezing, but she does have more difficulty lifting legs to complete her transfer to the treatment table. Pt tolerated  MLD, skin care and gradient knee length compression wraps to RLE without increased work with breathing and without pain. Pt's LLE  stocking remakes arrived during session so we were able to complete fitting. Upon initial assessment garments appear to fit very well and Pt is pleased with the increased width of top silicone band for keeping garments from sliding down so far. She will wear and wash remakes during visit interval and give her functional assessment  next week.    Occupational performance deficits (Please refer to evaluation for details): ADL's;IADL's;Rest and Sleep;Work;Leisure;Social Participation    Rehab Potential Good    Clinical Decision Making Multiple treatment options, significant modification of task necessary    Comorbidities Affecting Occupational Performance: Presence of comorbidities impacting occupational performance    Modification or Assistance to Complete Evaluation  Min-Moderate modification of tasks or assist with assess necessary to complete eval    OT Treatment/Interventions Self-care/ADL training;Therapeutic exercise;Functional Mobility Training;Manual Therapy;Energy conservation;Manual lymph drainage;Therapeutic activities;Coping strategies training;DME and/or AE instruction;Compression bandaging;Patient/family education    Plan Complete Deconhgestive Therapy (CDT) Intensive and Self -Management phases, consisting of Manual lymphatic Drainage (MLD), skin care, ther ex and compression bandaging    OT Home Exercise Plan lymphatic pumping therex 2-3 x q day, in sequence, 10 reps bilaterally    Recommended Other Services Daytime: 2 Pr. [employer]st EVAREX custom, ccl 2, A-D, 2.5 cm SB at oblique top edge, open toe, t heel.  HOS: [employer]st RELAX custom ccl 2 A-D, OT w. posterior zipper           Patient will benefit from skilled therapeutic intervention in order to improve the following deficits and impairments:           Visit Diagnosis: Lymphedema, not elsewhere classified    Problem List Patient Active Problem List   Diagnosis Date Noted  . Iron deficiency 08/06/2018  . History of total left  knee replacement (TKR) 07/18/2018  . Rectal bleeding 07/05/2018  . Hemorrhoids 07/05/2018  . Anal itching 07/05/2018  . Rotator cuff arthropathy of right shoulder   . Shoulder arthritis 06/15/2017  . Status post revision of total knee replacement, left 04/29/2017  . Polyethylene liner wear following left total knee arthroplasty requiring isolated polyethylene liner exchange ([place]) 03/02/2017  . 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 Unstable angina ([place]) 05/21/2016  . Chronic diastolic heart failure ([place]) 03/29/2016  . Normal coronary arteries 2009 01/14/2015  . Obesity-BMI 40 01/14/2015  . Restless leg 01/14/2015  . Chest pain 01/14/2015  . Back pain 01/14/2015  . Renal insufficiency 01/14/2015  . Dementia- mild memory issues 01/14/2015  . Occult blood positive stool 12/14/2014  . Anemia 12/14/2014  . Family history of colon cancer 12/14/2014  . Change in bowel habits 12/14/2014  . GERD (gastroesophageal reflux disease) 12/14/2014  . Dyspnea 05/17/2012  . Lymphedema 05/17/2012  . Weight gain 05/17/2012  . HTN (hypertension) 05/17/2012    Andrey Spearman, MS, OTR/L, [facility] 05/30/20 12:49 PM   [place] MAIN [facility] SERVICES [address] [place], [place], [address] Phone: [phone]   Fax:  [phone]  Name: [name] MRN: [id number] Date of Birth: May 16, 1946

## 2020-05-31 ENCOUNTER — Ambulatory Visit: Payer: Medicare Other | Admitting: Occupational Therapy

## 2020-05-31 ENCOUNTER — Other Ambulatory Visit: Payer: Self-pay

## 2020-05-31 DIAGNOSIS — I890 Lymphedema, not elsewhere classified: Secondary | ICD-10-CM

## 2020-05-31 NOTE — Therapy (Signed)
[place] MAIN [facility] SERVICES [address] [place], [place], [address] Phone: [phone]   Fax:  [phone]  Occupational Therapy Treatment  Patient Details  Name: [name] MRN: [id number] Date of Birth: Dec 29, 1945 Referring Provider (OT): Juluis Pitch, MD   Encounter Date: 05/31/2020   OT End of Session - 05/31/20 0914    Visit Number 47    Number of Visits 72    Date for OT Re-Evaluation 08/01/20    OT Start Time 0900    OT Stop Time 1007    OT Time Calculation (min) 67 min    Equipment Utilized During Treatment friction gloves and tyvek slipper    Activity Tolerance Patient tolerated treatment well;No increased pain    Behavior During Therapy WFL for tasks assessed/performed           Past Medical History:  Diagnosis Date  . Anemia   . Anxiety   . Arthritis    "hands, feet" (03/03/2017)  . Brain lesion    2 types  . Cataract   . Cervical spondylosis with myelopathy   . Chest pain    Normal cardiac cath 5/09  . Chronic lower back pain   . CKD (chronic kidney disease), stage III    [name]  . Congestive heart failure (CHF) ([place])    has a Cardiomems implant   . Constipation   . Dementia ([place])   . Depression   . Dumping syndrome   . Dyspnea    with activity  . Edema   . Fatigue   . Fibromyalgia   . GERD (gastroesophageal reflux disease)   . Headache    "w/high CBG" (03/03/2017)  . History of echocardiogram    a. Echo 03/20/16 (done at [facility] in [place], [place]):  mild LVH, EF 09%, normal diastolic function, mild LAE, MAC, RVSP 25 mmHg  . Hyperlipidemia   . Hypertension   . Hypothyroidism   . Lumbar spondylosis   . Lymphedema    seeing specialist for this  . Migraine    "mostly stopped when I changed my diet" (03/03/2017)  . OSA on CPAP   . Pneumonia X 1  . PONV (postoperative nausea and vomiting)   . Restless leg syndrome   . Rheumatoid arthritis ([place])   . Stroke [facility])     TIAs "mini strokes"- unsure of last TIA - pt and daughter deny this  . Syncope   . Tremors of nervous system   . Type II diabetes mellitus ([place])     Past Surgical History:  Procedure Laterality Date  . APPENDECTOMY  1966  . BACK SURGERY    . CARDIAC CATHETERIZATION N/A 05/25/2016   Procedure: Right/Left Heart Cath and Coronary Angiography;  Surgeon: Larey Dresser, MD;  Location: [place] [facility];  Service: Cardiovascular;  Laterality: N/A;  . CATARACT EXTRACTION W/ INTRAOCULAR LENS  IMPLANT, BILATERAL Bilateral   . COLONOSCOPY    . CORONARY ANGIOGRAM  2009   Normal coronaries  . EXCISION/RELEASE BURSA HIP Bilateral   . I & D KNEE WITH POLY EXCHANGE Left 03/02/2017   Procedure: Left knee Revision of poly liner;  Surgeon: Mcarthur Rossetti, MD;  Location: [place];  Service: Orthopedics;  Laterality: Left;  . JOINT REPLACEMENT    . KNEE ARTHROSCOPY Bilateral   . LAPAROSCOPIC CHOLECYSTECTOMY  2015  . LMF  2017   CardiMEMS HF implant for CHF (measures amount of fluid in the heart)  .  LUMBAR DISC SURGERY    . LUMBAR LAMINECTOMY/DECOMPRESSION MICRODISCECTOMY Left 12/2005   L2-3 laminectomy and diskectomy/notes 01/06/2011  . POSTERIOR LUMBAR FUSION  04/2006   [name] 01/06/2011; "put cages in"  . REVERSE SHOULDER ARTHROPLASTY Left 06/15/2017   Procedure: REVERSE LEFT SHOULDER ARTHROPLASTY;  Surgeon: Meredith Pel, MD;  Location: [place];  Service: Orthopedics;  Laterality: Left;  . REVERSE SHOULDER ARTHROPLASTY Right 02/01/2018  . REVERSE SHOULDER ARTHROPLASTY Right 02/01/2018   Procedure: RIGHT REVERSE SHOULDER ARTHROPLASTY;  Surgeon: Meredith Pel, MD;  Location: [place];  Service: Orthopedics;  Laterality: Right;  . REVISION TOTAL KNEE ARTHROPLASTY Left 03/02/2017   poly liner/notes 03/02/2017  . RIGHT HEART CATH N/A 11/13/2016   Procedure: Right Heart Cath with Cardiomems;  Surgeon: Larey Dresser, MD;  Location: [place] [facility];  Service: Cardiovascular;  Laterality:  N/A;  . SHOULDER ARTHROSCOPY WITH ROTATOR CUFF REPAIR Right   . SHOULDER OPEN ROTATOR CUFF REPAIR Left 01/2011   [name] 01/29/2011  . TOTAL ABDOMINAL HYSTERECTOMY    . TOTAL KNEE ARTHROPLASTY Left   . TOTAL KNEE ARTHROPLASTY Right 11/18/2012   Procedure: TOTAL KNEE ARTHROPLASTY;  Surgeon: Yvette Rack., MD;  Location: [place];  Service: Orthopedics;  Laterality: Right;  . TUBAL LIGATION    . UPPER GASTROINTESTINAL ENDOSCOPY    . UPPER GI ENDOSCOPY      There were no vitals filed for this visit.   Subjective Assessment - 05/31/20 0911    Subjective  [name] presents to OT for Rx visit 47/72 to address BLE lipo-lymphedema (I89.0). Pt presents with LLE compression stocking in place on LLE.Pt is pleased with remade garment. Vendor notified. "My legs hurt, both of them. I don't know if it's because I missed my medicine yesterday."    Pertinent History chronic BLE leg swelling and associated pain ~ 40 years; hx herniated intervertebral discL TKA; L total knee revision; HTN, Obesity. dementia; restless leg; CHF; unstable anina; R RC arthroplasty, OA, cervical spondylosis with myelopathy, chronic lower back pain, CKD (Stage?); Fibromyalgia; Hypothyroidism; OSA ( cPap?); hx pneumonia; RA, Type II Diabetes. S/P cardiac cathg, B THA, Lumbar laminectomy, posterior lumbar fusion, B shoulder arthroplasty; total abdominal hysterectomy    Limitations chronic leg swelling and associated pain, difficulty walking, impaired dynamic balance, muscle weakness,    Repetition Increases Symptoms    Special Tests + STEMMER sign base of toes bilaterally                        OT Treatments/Exercises (OP) - 05/31/20 0001      ADLs   ADL Education Given Yes      Manual Therapy   Manual Therapy Edema management;Manual Lymphatic Drainage (MLD);Compression Bandaging    Manual Lymphatic Drainage (MLD)  MLD to RLE utilizing modified "short neck sequence" with no strokes to neck in keeping w' LE thyroid  precautions. UTILIZED DEEP ABDOMINAL PATHWAYS VIS DIAPHRAGMATIC BREATHING, FUNCTIONAL INGUINAL ln, AND PROGIDED DYNAMIC STROKES TO LEG IN SEGMENTS FROM PROXIMAL TO DISTAL. eXCELLENT TOLERANCE.    Compression Bandaging RLE compression wraps as estab;lished. LLE replacement garment appears to fit well.                   OT Education - 05/31/20 0914    Education Details Continued skilled Pt/caregiver education  And LE ADL training throughout visit for lymphedema self care/ home program, including compression wrapping, compression garment and device wear/care, lymphatic pumping ther ex, simple self-MLD, and  skin care. Discussed progress towards goals.    Person(s) Educated Patient    Methods Explanation;Demonstration;Tactile cues;Verbal cues    Comprehension Verbalized understanding;Returned demonstration;Need further instruction               OT Long Term Goals - 05/13/20 1430      OT LONG TERM GOAL #1   Title Pt will be able to apply LLE multi-layer, short stretch compression wraps daily with maximum caregiver assistance, using correct gradient techniques independently to return affected limb/s, as closely as possible, to premorbid size and shape, to limit infection risk, and to improve safe functional mobility and ADLs performance.    Baseline dependent    Time 4    Period Days    Status Achieved   Pt has no assistance at SNF for compression wrapping. Pt launders and rolls bandages independently. Pt still requires ongoing verbal cues to lift leg and reposition wheil assistant waraps. 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     OT LONG TERM GOAL #2   Title Pt will be able to verbalize signs and symptoms of cellulitis infection and identify 4 common lymphedema precautions using printed resource for reference (modified independence) to limit LE progression over time.    Baseline Max A    Time 4    Period Days    Status Achieved      OT LONG TERM GOAL #3   Title Pt will sustain  than 85% compliance with all daily LE self-care home program components throughout Intensive Phase CDT, including impeccable skin care, lymphatic pumping therex,  compression wraps and simple self MLD, to ensure optimal limb volume reduction, to limit infection risk and to limit LE progression.    Baseline dependent    Time 12    Period Weeks    Status Achieved   Pt has achieved ~ 75% compliance with LE self care home program. Pt unableto reapply wraps between visits if she has to remove them     .     OT LONG TERM GOAL #4   Title Pt to achieve at least 10% LLE limb volume reduction during Intensive Phase CDT with Max CG assistance for wrapping, to improve functional performance of basic and instrumental ADLs, and to limit LE progression.    Baseline dependent    Time 12    Period Weeks    Status Partially Met   Met for all LLE segments and for full leg. Reduction goal for overall reduction of LLE limb volume to 20%. Now that RLE cellulitis is under better control we commenced CDT toLLE. By visual assessment would estimate ~  10% reduction thus far.     OT LONG TERM GOAL #5   Title During self-management phase of CDT Pt will retain limb volume reductions achieved during Intensive Phase CDT with no more than 3% volume increase using all LE self care components  to limit LE progression and further functional decline.    Baseline dependent    Time 6    Period Months    Status On-going                 Plan - 05/31/20 1004    Clinical Impression Statement Leg swelling decreased today compared with last visit. Pt pleased with remake garment for LLE, and it appears to contain and compress appropriately. Pt tolerated MLD, skin care and RLE compression wraps without increased pain. Pt reports analgesic effect to legs after MLD. Cont as per POC.  Occupational performance deficits (Please refer to evaluation for details): ADL's;IADL's;Rest and Sleep;Work;Leisure;Social Participation    Rehab  Potential Good    Clinical Decision Making Multiple treatment options, significant modification of task necessary    Comorbidities Affecting Occupational Performance: Presence of comorbidities impacting occupational performance    Modification or Assistance to Complete Evaluation  Min-Moderate modification of tasks or assist with assess necessary to complete eval    OT Treatment/Interventions Self-care/ADL training;Therapeutic exercise;Functional Mobility Training;Manual Therapy;Energy conservation;Manual lymph drainage;Therapeutic activities;Coping strategies training;DME and/or AE instruction;Compression bandaging;Patient/family education    Plan Complete Deconhgestive Therapy (CDT) Intensive and Self -Management phases, consisting of Manual lymphatic Drainage (MLD), skin care, ther ex and compression bandaging    OT Home Exercise Plan lymphatic pumping therex 2-3 x q day, in sequence, 10 reps bilaterally    Recommended Other Services Daytime: 2 Pr. [employer]st EVAREX custom, ccl 2, A-D, 2.5 cm SB at oblique top edge, open toe, t heel.  HOS: Jobst RELAX custom ccl 2 A-D, OT w. posterior zipper           Patient will benefit from skilled therapeutic intervention in order to improve the following deficits and impairments:           Visit Diagnosis: Lymphedema, not elsewhere classified    Problem List Patient Active Problem List   Diagnosis Date Noted  . Iron deficiency 08/06/2018  . History of total left knee replacement (TKR) 07/18/2018  . Rectal bleeding 07/05/2018  . Hemorrhoids 07/05/2018  . Anal itching 07/05/2018  . Rotator cuff arthropathy of right shoulder   . Shoulder arthritis 06/15/2017  . Status post revision of total knee replacement, left 04/29/2017  . Polyethylene liner wear following left total knee arthroplasty requiring isolated polyethylene liner exchange ([place]) 03/02/2017  . 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 Unstable angina ([place])  05/21/2016  . Chronic diastolic heart failure ([place]) 03/29/2016  . Normal coronary arteries 2009 01/14/2015  . Obesity-BMI 40 01/14/2015  . Restless leg 01/14/2015  . Chest pain 01/14/2015  . Back pain 01/14/2015  . Renal insufficiency 01/14/2015  . Dementia- mild memory issues 01/14/2015  . Occult blood positive stool 12/14/2014  . Anemia 12/14/2014  . Family history of colon cancer 12/14/2014  . Change in bowel habits 12/14/2014  . GERD (gastroesophageal reflux disease) 12/14/2014  . Dyspnea 05/17/2012  . Lymphedema 05/17/2012  . Weight gain 05/17/2012  . HTN (hypertension) 05/17/2012    Andrey Spearman, MS, OTR/L, [facility] 05/31/20 10:08 AM  [place] MAIN [facility] SERVICES [address] [place], [place], [address] Phone: [phone]   Fax:  [phone]  Name: [name] MRN: [id number] Date of Birth: Feb 28, 1946

## 2020-06-04 ENCOUNTER — Other Ambulatory Visit: Payer: Self-pay

## 2020-06-04 ENCOUNTER — Ambulatory Visit: Payer: Medicare Other | Admitting: Occupational Therapy

## 2020-06-04 DIAGNOSIS — I890 Lymphedema, not elsewhere classified: Secondary | ICD-10-CM

## 2020-06-04 NOTE — Therapy (Signed)
[place] MAIN [facility] SERVICES [address] [place], [place], [address] Phone: [phone]   Fax:  [phone]  Occupational Therapy Treatment  Patient Details  Name: [name] MRN: [id number] Date of Birth: December 12, 1945 Referring Provider (OT): Juluis Pitch, MD   Encounter Date: 06/04/2020   OT End of Session - 06/04/20 1428    Visit Number 48    Number of Visits 72    Date for OT Re-Evaluation 08/01/20    OT Start Time 1104    OT Stop Time 1215    OT Time Calculation (min) 71 min    Equipment Utilized During Treatment friction gloves and tyvek slipper    Activity Tolerance Patient tolerated treatment well;No increased pain    Behavior During Therapy WFL for tasks assessed/performed           Past Medical History:  Diagnosis Date   Anemia    Anxiety    Arthritis    "hands, feet" (03/03/2017)   Brain lesion    2 types   Cataract    Cervical spondylosis with myelopathy    Chest pain    Normal cardiac cath 5/09   Chronic lower back pain    CKD (chronic kidney disease), stage III    [name]   Congestive heart failure (CHF) (HCC)    has a Cardiomems implant    Constipation    Dementia ([place])    Depression    Dumping syndrome    Dyspnea    with activity   Edema    Fatigue    Fibromyalgia    GERD (gastroesophageal reflux disease)    Headache    "w/high CBG" (03/03/2017)   History of echocardiogram    a. Echo 03/20/16 (done at [facility] in [place], [place]):  mild LVH, EF 24%, normal diastolic function, mild LAE, MAC, RVSP 25 mmHg   Hyperlipidemia    Hypertension    Hypothyroidism    Lumbar spondylosis    Lymphedema    seeing specialist for this   Migraine    "mostly stopped when I changed my diet" (03/03/2017)   OSA on CPAP    Pneumonia X 1   PONV (postoperative nausea and vomiting)    Restless leg syndrome    Rheumatoid arthritis ([place])    Stroke ([place])     TIAs "mini strokes"- unsure of last TIA - pt and daughter deny this   Syncope    Tremors of nervous system    Type II diabetes mellitus ([place])     Past Surgical History:  Procedure Laterality Date   APPENDECTOMY  1966   BACK SURGERY     CARDIAC CATHETERIZATION N/A 05/25/2016   Procedure: Right/Left Heart Cath and Coronary Angiography;  Surgeon: Larey Dresser, MD;  Location: [place] [facility];  Service: Cardiovascular;  Laterality: N/A;   CATARACT EXTRACTION W/ INTRAOCULAR LENS  IMPLANT, BILATERAL Bilateral    COLONOSCOPY     CORONARY ANGIOGRAM  2009   Normal coronaries   EXCISION/RELEASE BURSA HIP Bilateral    I & D KNEE WITH POLY EXCHANGE Left 03/02/2017   Procedure: Left knee Revision of poly liner;  Surgeon: Mcarthur Rossetti, MD;  Location: [place];  Service: Orthopedics;  Laterality: Left;   JOINT REPLACEMENT     KNEE ARTHROSCOPY Bilateral    LAPAROSCOPIC CHOLECYSTECTOMY  2015   LMF  2017   CardiMEMS HF implant for CHF (measures amount of fluid in the heart)  LUMBAR DISC SURGERY     LUMBAR LAMINECTOMY/DECOMPRESSION MICRODISCECTOMY Left 12/2005   L2-3 laminectomy and diskectomy/notes 01/06/2011   POSTERIOR LUMBAR FUSION  04/2006   [name] 01/06/2011; "put cages in"   REVERSE SHOULDER ARTHROPLASTY Left 06/15/2017   Procedure: REVERSE LEFT SHOULDER ARTHROPLASTY;  Surgeon: Meredith Pel, MD;  Location: [place];  Service: Orthopedics;  Laterality: Left;   REVERSE SHOULDER ARTHROPLASTY Right 02/01/2018   REVERSE SHOULDER ARTHROPLASTY Right 02/01/2018   Procedure: RIGHT REVERSE SHOULDER ARTHROPLASTY;  Surgeon: Meredith Pel, MD;  Location: [place];  Service: Orthopedics;  Laterality: Right;   REVISION TOTAL KNEE ARTHROPLASTY Left 03/02/2017   poly liner/notes 03/02/2017   RIGHT HEART CATH N/A 11/13/2016   Procedure: Right Heart Cath with Cardiomems;  Surgeon: Larey Dresser, MD;  Location: [place] [facility];  Service: Cardiovascular;  Laterality:  N/A;   SHOULDER ARTHROSCOPY WITH ROTATOR CUFF REPAIR Right    SHOULDER OPEN ROTATOR CUFF REPAIR Left 01/2011   [name] 01/29/2011   TOTAL ABDOMINAL HYSTERECTOMY     TOTAL KNEE ARTHROPLASTY Left    TOTAL KNEE ARTHROPLASTY Right 11/18/2012   Procedure: TOTAL KNEE ARTHROPLASTY;  Surgeon: Yvette Rack., MD;  Location: [place];  Service: Orthopedics;  Laterality: Right;   TUBAL LIGATION     UPPER GASTROINTESTINAL ENDOSCOPY     UPPER GI ENDOSCOPY      There were no vitals filed for this visit.   Subjective Assessment - 06/04/20 1118    Subjective  [name] presents to OT for Rx visit 48/72 to address BLE lipo-lymphedema (I89.0). Pt presents with LLE compression stocking in place on LLE.Pt is pleased with remade garment. Pt has no new complaints. Pt requests full limb compression as tthis has worked very well for her in the past.    Pertinent History chronic BLE leg swelling and associated pain ~ 40 years; hx herniated intervertebral discL TKA; L total knee revision; HTN, Obesity. dementia; restless leg; CHF; unstable anina; R RC arthroplasty, OA, cervical spondylosis with myelopathy, chronic lower back pain, CKD (Stage?); Fibromyalgia; Hypothyroidism; OSA ( cPap?); hx pneumonia; RA, Type II Diabetes. S/P cardiac cathg, B THA, Lumbar laminectomy, posterior lumbar fusion, B shoulder arthroplasty; total abdominal hysterectomy    Limitations chronic leg swelling and associated pain, difficulty walking, impaired dynamic balance, muscle weakness,    Repetition Increases Symptoms    Special Tests + STEMMER sign base of toes bilaterally                        OT Treatments/Exercises (OP) - 06/04/20 0001      ADLs   ADL Education Given Yes      Manual Therapy   Manual Therapy Edema management    Manual Lymphatic Drainage (MLD)  MLD to RLE utilizing modified "short neck sequence" with no strokes to neck in keeping w' LE thyroid precautions. UTILIZED DEEP ABDOMINAL PATHWAYS VIS  DIAPHRAGMATIC BREATHING, FUNCTIONAL INGUINAL ln, AND PROGIDED DYNAMIC STROKES TO LEG IN SEGMENTS FROM PROXIMAL TO DISTAL. eXCELLENT TOLERANCE.    Compression Bandaging RLE compression wraps extended to groin using 15 cm wide short stretch wraps over wide Rasial  foam above the knee and  Ideabinde wrap at upper edge.                   OT Education - 06/04/20 1428    Education Details Continued skilled Pt/caregiver education  And LE ADL training throughout visit for lymphedema self care/ home program, including  compression wrapping, compression garment and device wear/care, lymphatic pumping ther ex, simple self-MLD, and skin care. Discussed progress towards goals.    Person(s) Educated Patient    Methods Explanation;Demonstration;Tactile cues;Verbal cues    Comprehension Verbalized understanding;Returned demonstration;Need further instruction               OT Long Term Goals - 05/13/20 1430      OT LONG TERM GOAL #1   Title Pt will be able to apply LLE multi-layer, short stretch compression wraps daily with maximum caregiver assistance, using correct gradient techniques independently to return affected limb/s, as closely as possible, to premorbid size and shape, to limit infection risk, and to improve safe functional mobility and ADLs performance.    Baseline dependent    Time 4    Period Days    Status Achieved   Pt has no assistance at SNF for compression wrapping. Pt launders and rolls bandages independently. Pt still requires ongoing verbal cues to lift leg and reposition wheil assistant waraps. 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     OT LONG TERM GOAL #2   Title Pt will be able to verbalize signs and symptoms of cellulitis infection and identify 4 common lymphedema precautions using printed resource for reference (modified independence) to limit LE progression over time.    Baseline Max A    Time 4    Period Days    Status Achieved      OT LONG TERM GOAL #3   Title  Pt will sustain than 85% compliance with all daily LE self-care home program components throughout Intensive Phase CDT, including impeccable skin care, lymphatic pumping therex,  compression wraps and simple self MLD, to ensure optimal limb volume reduction, to limit infection risk and to limit LE progression.    Baseline dependent    Time 12    Period Weeks    Status Achieved   Pt has achieved ~ 75% compliance with LE self care home program. Pt unableto reapply wraps between visits if she has to remove them     .     OT LONG TERM GOAL #4   Title Pt to achieve at least 10% LLE limb volume reduction during Intensive Phase CDT with Max CG assistance for wrapping, to improve functional performance of basic and instrumental ADLs, and to limit LE progression.    Baseline dependent    Time 12    Period Weeks    Status Partially Met   Met for all LLE segments and for full leg. Reduction goal for overall reduction of LLE limb volume to 20%. Now that RLE cellulitis is under better control we commenced CDT toLLE. By visual assessment would estimate ~  10% reduction thus far.     OT LONG TERM GOAL #5   Title During self-management phase of CDT Pt will retain limb volume reductions achieved during Intensive Phase CDT with no more than 3% volume increase using all LE self care components  to limit LE progression and further functional decline.    Baseline dependent    Time 6    Period Months    Status On-going                 Plan - 06/04/20 1429    Clinical Impression Statement Pt reports referring provider did not sign referral for PT for wc eval despite Rehab sending 2 requests as he feels nursing home PT dept can handle this request/. Pt states she prefers to  have seating and mobility performed at [facility] so she will request the referal be provided. Pt will benefit from power seatinmg and mobility to open hip angle  using poewer recline to improve lymphatic circulation, power legrest elevation to  allow gravity assisted lymphatic recirculation towards the heart, and power tilt in space, including 5 degrees of anterior tilt, to assist with transfers and for optimal pressure relief and gravity assisted repositioning throughout the day.Upon Pt request we extended RLE gradient compression wrap     above knee to groin. Pt tells me she has had good success with wraps above the knee in the past and she wouold like to explore this again. Pt was able to stand for bandaging while holding onto wc handles for support. No LOB observed. Pt continues to make steady progress towards all goals. She is fully engaged in all aspects of OT for CDT and eagerl participates in clincal activities and home program. Cont as per POC.Consider adding Elvarex custom capri over knee highs if Pt is able to tolerate full length wraps and if we see a significant reduction in edema..    Occupational performance deficits (Please refer to evaluation for details): ADL's;IADL's;Rest and Sleep;Work;Leisure;Social Participation    Rehab Potential Good    Clinical Decision Making Multiple treatment options, significant modification of task necessary    Comorbidities Affecting Occupational Performance: Presence of comorbidities impacting occupational performance    Modification or Assistance to Complete Evaluation  Min-Moderate modification of tasks or assist with assess necessary to complete eval    OT Treatment/Interventions Self-care/ADL training;Therapeutic exercise;Functional Mobility Training;Manual Therapy;Energy conservation;Manual lymph drainage;Therapeutic activities;Coping strategies training;DME and/or AE instruction;Compression bandaging;Patient/family education    Plan Complete Deconhgestive Therapy (CDT) Intensive and Self -Management phases, consisting of Manual lymphatic Drainage (MLD), skin care, ther ex and compression bandaging    OT Home Exercise Plan lymphatic pumping therex 2-3 x q day, in sequence, 10 reps bilaterally     Recommended Other Services Daytime: 2 Pr. Jobst EVAREX custom, ccl 2, A-D, 2.5 cm SB at oblique top edge, open toe, t heel.  HOS: [employer]st RELAX custom ccl 2 A-D, OT w. posterior zipper           Patient will benefit from skilled therapeutic intervention in order to improve the following deficits and impairments:           Visit Diagnosis: Lymphedema, not elsewhere classified    Problem List Patient Active Problem List   Diagnosis Date Noted   Iron deficiency 08/06/2018   History of total left knee replacement (TKR) 07/18/2018   Rectal bleeding 07/05/2018   Hemorrhoids 07/05/2018   Anal itching 07/05/2018   Rotator cuff arthropathy of right shoulder    Shoulder arthritis 06/15/2017   Status post revision of total knee replacement, left 04/29/2017   Polyethylene liner wear following left total knee arthroplasty requiring isolated polyethylene liner exchange ([place]) 03/02/2017   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[place]) 03/02/2017   Unstable angina ([place]) 05/21/2016   Chronic diastolic heart failure ([place]) 03/29/2016   Normal coronary arteries 2009 01/14/2015   Obesity-BMI 40 01/14/2015   Restless leg 01/14/2015   Chest pain 01/14/2015   Back pain 01/14/2015   Renal insufficiency 01/14/2015   Dementia- mild memory issues 01/14/2015   Occult blood positive stool 12/14/2014   Anemia 12/14/2014   Family history of colon cancer 12/14/2014   Change in bowel habits 12/14/2014   GERD (gastroesophageal reflux disease) 12/14/2014   Dyspnea 05/17/2012   Lymphedema 05/17/2012  Weight gain 05/17/2012   HTN (hypertension) 05/17/2012    Andrey Spearman, MS, OTR/L, [facility] 06/04/20 2:43 PM  [place] MAIN [facility] SERVICES [address] [place], [place], [address] Phone: [phone]   Fax:  [phone]  Name: [name] MRN: [id number] Date of Birth: 25-Mar-1946

## 2020-06-05 ENCOUNTER — Encounter: Payer: Medicare Other | Admitting: Occupational Therapy

## 2020-06-06 ENCOUNTER — Ambulatory Visit: Payer: Medicare Other | Admitting: Occupational Therapy

## 2020-06-06 ENCOUNTER — Other Ambulatory Visit: Payer: Self-pay

## 2020-06-06 DIAGNOSIS — I890 Lymphedema, not elsewhere classified: Secondary | ICD-10-CM | POA: Diagnosis not present

## 2020-06-06 NOTE — Therapy (Signed)
[place] MAIN [facility] SERVICES [address] [place], [place], [address] Phone: [phone]   Fax:  [phone]  Occupational Therapy Treatment  Patient Details  Name: [name] MRN: [id number] Date of Birth: 02-Jun-1946 Referring Provider (OT): Juluis Pitch, MD   Encounter Date: 06/06/2020   OT End of Session - 06/06/20 1202    Visit Number 49    Number of Visits 72    Date for OT Re-Evaluation 08/01/20    OT Start Time 1104    OT Stop Time 1142    OT Time Calculation (min) 38 min    Equipment Utilized During Treatment friction gloves and tyvek slipper    Activity Tolerance Patient tolerated treatment well;No increased pain    Behavior During Therapy WFL for tasks assessed/performed           Past Medical History:  Diagnosis Date  . Anemia   . Anxiety   . Arthritis    "hands, feet" (03/03/2017)  . Brain lesion    2 types  . Cataract   . Cervical spondylosis with myelopathy   . Chest pain    Normal cardiac cath 5/09  . Chronic lower back pain   . CKD (chronic kidney disease), stage III    [name]  . Congestive heart failure (CHF) ([place])    has a Cardiomems implant   . Constipation   . Dementia ([place])   . Depression   . Dumping syndrome   . Dyspnea    with activity  . Edema   . Fatigue   . Fibromyalgia   . GERD (gastroesophageal reflux disease)   . Headache    "w/high CBG" (03/03/2017)  . History of echocardiogram    a. Echo 03/20/16 (done at [facility] in [place], [place]):  mild LVH, EF 71%, normal diastolic function, mild LAE, MAC, RVSP 25 mmHg  . Hyperlipidemia   . Hypertension   . Hypothyroidism   . Lumbar spondylosis   . Lymphedema    seeing specialist for this  . Migraine    "mostly stopped when I changed my diet" (03/03/2017)  . OSA on CPAP   . Pneumonia X 1  . PONV (postoperative nausea and vomiting)   . Restless leg syndrome   . Rheumatoid arthritis ([place])   . Stroke [facility])     TIAs "mini strokes"- unsure of last TIA - pt and daughter deny this  . Syncope   . Tremors of nervous system   . Type II diabetes mellitus ([place])     Past Surgical History:  Procedure Laterality Date  . APPENDECTOMY  1966  . BACK SURGERY    . CARDIAC CATHETERIZATION N/A 05/25/2016   Procedure: Right/Left Heart Cath and Coronary Angiography;  Surgeon: Larey Dresser, MD;  Location: [place] [facility];  Service: Cardiovascular;  Laterality: N/A;  . CATARACT EXTRACTION W/ INTRAOCULAR LENS  IMPLANT, BILATERAL Bilateral   . COLONOSCOPY    . CORONARY ANGIOGRAM  2009   Normal coronaries  . EXCISION/RELEASE BURSA HIP Bilateral   . I & D KNEE WITH POLY EXCHANGE Left 03/02/2017   Procedure: Left knee Revision of poly liner;  Surgeon: Mcarthur Rossetti, MD;  Location: [place];  Service: Orthopedics;  Laterality: Left;  . JOINT REPLACEMENT    . KNEE ARTHROSCOPY Bilateral   . LAPAROSCOPIC CHOLECYSTECTOMY  2015  . LMF  2017   CardiMEMS HF implant for CHF (measures amount of fluid in the heart)  .  LUMBAR DISC SURGERY    . LUMBAR LAMINECTOMY/DECOMPRESSION MICRODISCECTOMY Left 12/2005   L2-3 laminectomy and diskectomy/notes 01/06/2011  . POSTERIOR LUMBAR FUSION  04/2006   [name] 01/06/2011; "put cages in"  . REVERSE SHOULDER ARTHROPLASTY Left 06/15/2017   Procedure: REVERSE LEFT SHOULDER ARTHROPLASTY;  Surgeon: Meredith Pel, MD;  Location: [place];  Service: Orthopedics;  Laterality: Left;  . REVERSE SHOULDER ARTHROPLASTY Right 02/01/2018  . REVERSE SHOULDER ARTHROPLASTY Right 02/01/2018   Procedure: RIGHT REVERSE SHOULDER ARTHROPLASTY;  Surgeon: Meredith Pel, MD;  Location: [place];  Service: Orthopedics;  Laterality: Right;  . REVISION TOTAL KNEE ARTHROPLASTY Left 03/02/2017   poly liner/notes 03/02/2017  . RIGHT HEART CATH N/A 11/13/2016   Procedure: Right Heart Cath with Cardiomems;  Surgeon: Larey Dresser, MD;  Location: [place] [facility];  Service: Cardiovascular;  Laterality:  N/A;  . SHOULDER ARTHROSCOPY WITH ROTATOR CUFF REPAIR Right   . SHOULDER OPEN ROTATOR CUFF REPAIR Left 01/2011   [name] 01/29/2011  . TOTAL ABDOMINAL HYSTERECTOMY    . TOTAL KNEE ARTHROPLASTY Left   . TOTAL KNEE ARTHROPLASTY Right 11/18/2012   Procedure: TOTAL KNEE ARTHROPLASTY;  Surgeon: Yvette Rack., MD;  Location: [facility];  Service: Orthopedics;  Laterality: Right;  . TUBAL LIGATION    . UPPER GASTROINTESTINAL ENDOSCOPY    . UPPER GI ENDOSCOPY      There were no vitals filed for this visit.   Subjective Assessment - 06/06/20 1200    Subjective  [name] presents to OT for Rx visit 49/72 to address BLE lipo-lymphedema (I89.0). Pt presents with RLE compression wrap in place to knee and custom stocking in place on LLE. "I forget it's there."    Pertinent History chronic BLE leg swelling and associated pain ~ 40 years; hx herniated intervertebral discL TKA; L total knee revision; HTN, Obesity. dementia; restless leg; CHF; unstable anina; R RC arthroplasty, OA, cervical spondylosis with myelopathy, chronic lower back pain, CKD (Stage?); Fibromyalgia; Hypothyroidism; OSA ( cPap?); hx pneumonia; RA, Type II Diabetes. S/P cardiac cathg, B THA, Lumbar laminectomy, posterior lumbar fusion, B shoulder arthroplasty; total abdominal hysterectomy    Limitations chronic leg swelling and associated pain, difficulty walking, impaired dynamic balance, muscle weakness,    Repetition Increases Symptoms    Special Tests + STEMMER sign base of toes bilaterally                        OT Treatments/Exercises (OP) - 06/06/20 0001      ADLs   ADL Education Given Yes      Manual Therapy   Manual Therapy Edema management    Manual Lymphatic Drainage (MLD) No MLD due to time constraints    Compression Bandaging RLE compression wraps extended to groin using 15 cm wide short stretch wraps over wide Rasial  foam above the knee and  Ideabinde wrap at upper edge.                   OT  Education - 06/06/20 1201    Education Details Continued skilled Pt/caregiver education  And LE ADL training throughout visit for lymphedema self care/ home program, including compression wrapping, compression garment and device wear/care, lymphatic pumping ther ex, simple self-MLD, and skin care. Discussed progress towards goals.    Person(s) Educated Patient    Methods Explanation;Demonstration;Tactile cues;Verbal cues    Comprehension Verbalized understanding;Returned demonstration;Need further instruction  OT Long Term Goals - 05/13/20 1430      OT LONG TERM GOAL #1   Title Pt will be able to apply LLE multi-layer, short stretch compression wraps daily with maximum caregiver assistance, using correct gradient techniques independently to return affected limb/s, as closely as possible, to premorbid size and shape, to limit infection risk, and to improve safe functional mobility and ADLs performance.    Baseline dependent    Time 4    Period Days    Status Achieved   Pt has no assistance at SNF for compression wrapping. Pt launders and rolls bandages independently. Pt still requires ongoing verbal cues to lift leg and reposition wheil assistant waraps. 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     OT LONG TERM GOAL #2   Title Pt will be able to verbalize signs and symptoms of cellulitis infection and identify 4 common lymphedema precautions using printed resource for reference (modified independence) to limit LE progression over time.    Baseline Max A    Time 4    Period Days    Status Achieved      OT LONG TERM GOAL #3   Title Pt will sustain than 85% compliance with all daily LE self-care home program components throughout Intensive Phase CDT, including impeccable skin care, lymphatic pumping therex,  compression wraps and simple self MLD, to ensure optimal limb volume reduction, to limit infection risk and to limit LE progression.    Baseline dependent    Time 12     Period Weeks    Status Achieved   Pt has achieved ~ 75% compliance with LE self care home program. Pt unableto reapply wraps between visits if she has to remove them     .     OT LONG TERM GOAL #4   Title Pt to achieve at least 10% LLE limb volume reduction during Intensive Phase CDT with Max CG assistance for wrapping, to improve functional performance of basic and instrumental ADLs, and to limit LE progression.    Baseline dependent    Time 12    Period Weeks    Status Partially Met   Met for all LLE segments and for full leg. Reduction goal for overall reduction of LLE limb volume to 20%. Now that RLE cellulitis is under better control we commenced CDT toLLE. By visual assessment would estimate ~  10% reduction thus far.     OT LONG TERM GOAL #5   Title During self-management phase of CDT Pt will retain limb volume reductions achieved during Intensive Phase CDT with no more than 3% volume increase using all LE self care components  to limit LE progression and further functional decline.    Baseline dependent    Time 6    Period Months    Status On-going                 Plan - 06/06/20 1203    Clinical Impression Statement Length of visit abbreviated today due to time constraints. Manufacturer's trainer from [employer] meeting Pt at SNF to assist her with Flex[employer]ouch set up and training.Manufacturer's rep will atttend second visit for staff training. ROE swelling very well managed today as Pt was able to keep compression in place below the knee since last visit. We reapplied compression wraps to full RLE as established. Progress note due next visit. Plan to complete blietral comparative limb volumetrics to measure volume reduction .    Occupational performance deficits (Please refer  to evaluation for details): ADL's;IADL's;Rest and Sleep;Work;Leisure;Social Participation    Rehab Potential Good    Clinical Decision Making Multiple treatment options, significant modification of  task necessary    Comorbidities Affecting Occupational Performance: Presence of comorbidities impacting occupational performance    Modification or Assistance to Complete Evaluation  Min-Moderate modification of tasks or assist with assess necessary to complete eval    OT Treatment/Interventions Self-care/ADL training;Therapeutic exercise;Functional Mobility Training;Manual Therapy;Energy conservation;Manual lymph drainage;Therapeutic activities;Coping strategies training;DME and/or AE instruction;Compression bandaging;Patient/family education    Plan Complete Deconhgestive Therapy (CDT) Intensive and Self -Management phases, consisting of Manual lymphatic Drainage (MLD), skin care, ther ex and compression bandaging    OT Home Exercise Plan lymphatic pumping therex 2-3 x q day, in sequence, 10 reps bilaterally    Recommended Other Services Daytime: 2 Pr. Jobst EVAREX custom, ccl 2, A-D, 2.5 cm SB at oblique top edge, open toe, t heel.  HOS: Jobst RELAX custom ccl 2 A-D, OT w. posterior zipper           Patient will benefit from skilled therapeutic intervention in order to improve the following deficits and impairments:           Visit Diagnosis: Lymphedema, not elsewhere classified    Problem List Patient Active Problem List   Diagnosis Date Noted  . Iron deficiency 08/06/2018  . History of total left knee replacement (TKR) 07/18/2018  . Rectal bleeding 07/05/2018  . Hemorrhoids 07/05/2018  . Anal itching 07/05/2018  . Rotator cuff arthropathy of right shoulder   . Shoulder arthritis 06/15/2017  . Status post revision of total knee replacement, left 04/29/2017  . Polyethylene liner wear following left total knee arthroplasty requiring isolated polyethylene liner exchange ([place]) 03/02/2017  . 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 Unstable angina ([place]) 05/21/2016  . Chronic diastolic heart failure ([place]) 03/29/2016  . Normal coronary arteries 2009  01/14/2015  . Obesity-BMI 40 01/14/2015  . Restless leg 01/14/2015  . Chest pain 01/14/2015  . Back pain 01/14/2015  . Renal insufficiency 01/14/2015  . Dementia- mild memory issues 01/14/2015  . Occult blood positive stool 12/14/2014  . Anemia 12/14/2014  . Family history of colon cancer 12/14/2014  . Change in bowel habits 12/14/2014  . GERD (gastroesophageal reflux disease) 12/14/2014  . Dyspnea 05/17/2012  . Lymphedema 05/17/2012  . Weight gain 05/17/2012  . HTN (hypertension) 05/17/2012    Andrey Spearman, MS, OTR/L, [facility] 06/06/20 12:07 PM  [place] MAIN [facility] SERVICES [address] [place], [place], [address] Phone: [phone]   Fax:  [phone]  Name: ERZA MOTHERSHEAD MRN: [id number] Date of Birth: March 06, 1946

## 2020-06-07 ENCOUNTER — Ambulatory Visit: Payer: Medicare Other | Admitting: Occupational Therapy

## 2020-06-07 ENCOUNTER — Other Ambulatory Visit: Payer: Self-pay

## 2020-06-07 DIAGNOSIS — I890 Lymphedema, not elsewhere classified: Secondary | ICD-10-CM | POA: Diagnosis not present

## 2020-06-07 NOTE — Therapy (Signed)
[place] MAIN [facility] SERVICES [address] [place], [place], [address] Phone: [phone]   Fax:  [phone]  Occupational Therapy Treatment Note and Progress Report: Lymphedema Care  Patient Details  Name: [name] MRN: [id number] Date of Birth: 09/02/1945 Referring Provider (OT): Juluis Pitch, MD   Encounter Date: 06/07/2020   OT End of Session - 06/07/20 1024    Visit Number 50    Number of Visits 72    Date for OT Re-Evaluation 08/01/20    OT Start Time 0905    OT Stop Time 1016    OT Time Calculation (min) 71 min    Equipment Utilized During Treatment friction gloves and tyvek slipper    Activity Tolerance Patient tolerated treatment well;No increased pain    Behavior During Therapy WFL for tasks assessed/performed           Past Medical History:  Diagnosis Date  . Anemia   . Anxiety   . Arthritis    "hands, feet" (03/03/2017)  . Brain lesion    2 types  . Cataract   . Cervical spondylosis with myelopathy   . Chest pain    Normal cardiac cath 5/09  . Chronic lower back pain   . CKD (chronic kidney disease), stage III    [name]  . Congestive heart failure (CHF) ([place])    has a Cardiomems implant   . Constipation   . Dementia ([place])   . Depression   . Dumping syndrome   . Dyspnea    with activity  . Edema   . Fatigue   . Fibromyalgia   . GERD (gastroesophageal reflux disease)   . Headache    "w/high CBG" (03/03/2017)  . History of echocardiogram    a. Echo 03/20/16 (done at [facility] in [place], [place]):  mild LVH, EF 66%, normal diastolic function, mild LAE, MAC, RVSP 25 mmHg  . Hyperlipidemia   . Hypertension   . Hypothyroidism   . Lumbar spondylosis   . Lymphedema    seeing specialist for this  . Migraine    "mostly stopped when I changed my diet" (03/03/2017)  . OSA on CPAP   . Pneumonia X 1  . PONV (postoperative nausea and vomiting)   . Restless leg syndrome   .  Rheumatoid arthritis ([place])   . Stroke [facility])    TIAs "mini strokes"- unsure of last TIA - pt and daughter deny this  . Syncope   . Tremors of nervous system   . Type II diabetes mellitus ([place])     Past Surgical History:  Procedure Laterality Date  . APPENDECTOMY  1966  . BACK SURGERY    . CARDIAC CATHETERIZATION N/A 05/25/2016   Procedure: Right/Left Heart Cath and Coronary Angiography;  Surgeon: Larey Dresser, MD;  Location: [place] CV [facility];  Service: Cardiovascular;  Laterality: N/A;  . CATARACT EXTRACTION W/ INTRAOCULAR LENS  IMPLANT, BILATERAL Bilateral   . COLONOSCOPY    . CORONARY ANGIOGRAM  2009   Normal coronaries  . EXCISION/RELEASE BURSA HIP Bilateral   . I & D KNEE WITH POLY EXCHANGE Left 03/02/2017   Procedure: Left knee Revision of poly liner;  Surgeon: Mcarthur Rossetti, MD;  Location: [place];  Service: Orthopedics;  Laterality: Left;  . JOINT REPLACEMENT    . KNEE ARTHROSCOPY Bilateral   . LAPAROSCOPIC CHOLECYSTECTOMY  2015  . LMF  2017   CardiMEMS HF implant for CHF (measures amount  of fluid in the heart)  . LUMBAR DISC SURGERY    . LUMBAR LAMINECTOMY/DECOMPRESSION MICRODISCECTOMY Left 12/2005   L2-3 laminectomy and diskectomy/notes 01/06/2011  . POSTERIOR LUMBAR FUSION  04/2006   [name] 01/06/2011; "put cages in"  . REVERSE SHOULDER ARTHROPLASTY Left 06/15/2017   Procedure: REVERSE LEFT SHOULDER ARTHROPLASTY;  Surgeon: Meredith Pel, MD;  Location: [place];  Service: Orthopedics;  Laterality: Left;  . REVERSE SHOULDER ARTHROPLASTY Right 02/01/2018  . REVERSE SHOULDER ARTHROPLASTY Right 02/01/2018   Procedure: RIGHT REVERSE SHOULDER ARTHROPLASTY;  Surgeon: Meredith Pel, MD;  Location: [place];  Service: Orthopedics;  Laterality: Right;  . REVISION TOTAL KNEE ARTHROPLASTY Left 03/02/2017   poly liner/notes 03/02/2017  . RIGHT HEART CATH N/A 11/13/2016   Procedure: Right Heart Cath with Cardiomems;  Surgeon: Larey Dresser, MD;  Location: [place]  CV LAB;  Service: Cardiovascular;  Laterality: N/A;  . SHOULDER ARTHROSCOPY WITH ROTATOR CUFF REPAIR Right   . SHOULDER OPEN ROTATOR CUFF REPAIR Left 01/2011   [name] 01/29/2011  . TOTAL ABDOMINAL HYSTERECTOMY    . TOTAL KNEE ARTHROPLASTY Left   . TOTAL KNEE ARTHROPLASTY Right 11/18/2012   Procedure: TOTAL KNEE ARTHROPLASTY;  Surgeon: Yvette Rack., MD;  Location: [place];  Service: Orthopedics;  Laterality: Right;  . TUBAL LIGATION    . UPPER GASTROINTESTINAL ENDOSCOPY    . UPPER GI ENDOSCOPY      There were no vitals filed for this visit.   Subjective Assessment - 06/07/20 0913    Subjective  Mrs Shumard presents to OT for Rx visit 50/72 to address BLE lipo-lymphedema (I89.0). Pt presents with RLE compression wrap in place to knee and custom stocking in place on LLE. Pt reports staff continues to have difficulty placing custom compression garments correctly. Flexitouch device trainer fitted "pump" and provided initial staff training yesterday. Tactile rep to follow up to ensure optimal staff training.    Pertinent History chronic BLE leg swelling and associated pain ~ 40 years; hx herniated intervertebral discL TKA; L total knee revision; HTN, Obesity. dementia; restless leg; CHF; unstable anina; R RC arthroplasty, OA, cervical spondylosis with myelopathy, chronic lower back pain, CKD (Stage?); Fibromyalgia; Hypothyroidism; OSA ( cPap?); hx pneumonia; RA, Type II Diabetes. S/P cardiac cathg, B THA, Lumbar laminectomy, posterior lumbar fusion, B shoulder arthroplasty; total abdominal hysterectomy    Limitations chronic leg swelling and associated pain, difficulty walking, impaired dynamic balance, muscle weakness,    Repetition Increases Symptoms    Special Tests + STEMMER sign base of toes bilaterally               LYMPHEDEMA/ONCOLOGY QUESTIONNAIRE - 06/07/20 0001      Right Lower Extremity Lymphedema   Other RLE A-D= 8999.2 ml. RLE E-G = 8282.7 ml. RLE A-G= 17281.9 ml    Other R leg  volume reduction from ankle to tibial tuberosity  (A-D) measures 6.7% since initially measured on 02/09/20. R thigh (E-G) volume reduction measures 15.7% since 6/18. And full RLE (A-G) volume reduction todate measures 11.2%.                    OT Treatments/Exercises (OP) - 06/07/20 0001      ADLs   ADL Education Given Yes      Manual Therapy   Manual Therapy Edema management    Manual therapy comments RLE comparative limb volumetrics    Manual Lymphatic Drainage (MLD) No MLD due to time constraints    Compression Bandaging RLE  compression wraps extended to groin using 15 cm wide short stretch wraps over wide Rasial  foam above the knee and  Ideabinde wrap at upper edge.                   OT Education - 06/07/20 1024    Education Details Reviewed Flexitouch precautions. Continued skilled Pt/caregiver education  And LE ADL training throughout visit for lymphedema self care/ home program, including compression wrapping, compression garment and device wear/care, lymphatic pumping ther ex, simple self-MLD, and skin care. Discussed progress towards goals.    Person(s) Educated Patient    Methods Explanation;Demonstration;Tactile cues;Verbal cues    Comprehension Verbalized understanding;Returned demonstration;Need further instruction               OT Long Term Goals - 06/07/20 1025      OT LONG TERM GOAL #1   Title Pt will be able to apply LLE multi-layer, short stretch compression wraps daily with maximum caregiver assistance, using correct gradient techniques independently to return affected limb/s, as closely as possible, to premorbid size and shape, to limit infection risk, and to improve safe functional mobility and ADLs performance.    Baseline dependent    Time 4    Period Days    Status Achieved   Pt has no assistance at SNF for compression wrapping. Pt launders and rolls bandages independently. Pt still requires ongoing verbal cues to lift leg and reposition  wheil assistant waraps. 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     OT LONG TERM GOAL #2   Title Pt will be able to verbalize signs and symptoms of cellulitis infection and identify 4 common lymphedema precautions using printed resource for reference (modified independence) to limit LE progression over time.    Baseline Max A    Time 4    Period Days    Status Achieved      OT LONG TERM GOAL #3   Title Pt will sustain than 85% compliance with all daily LE self-care home program components throughout Intensive Phase CDT, including impeccable skin care, lymphatic pumping therex,  compression wraps and simple self MLD, to ensure optimal limb volume reduction, to limit infection risk and to limit LE progression.    Baseline dependent    Time 12    Period Weeks    Status Partially Met   Pt 85% compliant. Staff at SNF not always available to support 85% compliance     OT LONG TERM GOAL #4   Title Pt to achieve at least 10% LLE limb volume reduction during Intensive Phase CDT with Max CG assistance for wrapping, to improve functional performance of basic and instrumental ADLs, and to limit LE progression.   to date LLE reductions: A-D= 15.7%, E-G= 29.63%, A-G= 22.9%. RLE: A-D: 6.7%, E-G= 15.7%, A-G= 11.2%   Baseline dependent    Time 12    Period Weeks    Status Partially Met   Met for all LLE segments and for full leg. Reduction goal for overall reduction of LLE limb volume to 20%. Now that RLE cellulitis is under better control we commenced CDT toLLE. By visual assessment would estimate ~  10% reduction thus far.     OT LONG TERM GOAL #5   Title During self-management phase of CDT Pt will retain limb volume reductions achieved during Intensive Phase CDT with no more than 3% volume increase using all LE self care components  to limit LE progression and further functional decline.  Baseline dependent    Time 6    Period Months    Status On-going                 Plan - 06/07/20 1029     Clinical Impression Statement Completed RLE comparative limb volumetrics. Since commencing CDT to RLE a couple of weeks ago, limb volume reduction for leg (A-D) measures 6.7%. Reduction for thigh (E-G) measures 15.7%. And limb volume reduction for full RLE measures 11.2%. (To date LLE reductions: A-D= 15.7%, E-G= 29.63%, A-G= 22.9%.) Pt demonstrates excellent progresstowards all goals.Flexitouch device was fitted at SNF yesterday and one stafff attended training by manufacturer's rep. Flexitouch should be used daily only,  one a single leg only . Flexi should be DCdif Pt presents with signs/symptoms of infection, concerns for DVT or PE, systemic illness, aka flu , kidney infection, etc, and should be DC immediately if SOB  or acute pain is observed. Cont as per POC.    Occupational performance deficits (Please refer to evaluation for details): ADL's;IADL's;Rest and Sleep;Work;Leisure;Social Participation    Rehab Potential Good    Clinical Decision Making Multiple treatment options, significant modification of task necessary    Comorbidities Affecting Occupational Performance: Presence of comorbidities impacting occupational performance    Modification or Assistance to Complete Evaluation  Min-Moderate modification of tasks or assist with assess necessary to complete eval    OT Treatment/Interventions Self-care/ADL training;Therapeutic exercise;Functional Mobility Training;Manual Therapy;Energy conservation;Manual lymph drainage;Therapeutic activities;Coping strategies training;DME and/or AE instruction;Compression bandaging;Patient/family education    Plan Complete Deconhgestive Therapy (CDT) Intensive and Self -Management phases, consisting of Manual lymphatic Drainage (MLD), skin care, ther ex and compression bandaging    OT Home Exercise Plan lymphatic pumping therex 2-3 x q day, in sequence, 10 reps bilaterally    Recommended Other Services Daytime: 2 Pr. Jobst EVAREX custom, ccl 2, A-D, 2.5 cm SB  at oblique top edge, open toe, t heel.  HOS: Jobst RELAX custom ccl 2 A-D, OT w. posterior zipper           Patient will benefit from skilled therapeutic intervention in order to improve the following deficits and impairments:           Visit Diagnosis: Lymphedema, not elsewhere classified    Problem List Patient Active Problem List   Diagnosis Date Noted  . Iron deficiency 08/06/2018  . History of total left knee replacement (TKR) 07/18/2018  . Rectal bleeding 07/05/2018  . Hemorrhoids 07/05/2018  . Anal itching 07/05/2018  . Rotator cuff arthropathy of right shoulder   . Shoulder arthritis 06/15/2017  . Status post revision of total knee replacement, left 04/29/2017  . Polyethylene liner wear following left total knee arthroplasty requiring isolated polyethylene liner exchange ([place]) 03/02/2017  . 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 Unstable angina ([place]) 05/21/2016  . Chronic diastolic heart failure ([place]) 03/29/2016  . Normal coronary arteries 2009 01/14/2015  . Obesity-BMI 40 01/14/2015  . Restless leg 01/14/2015  . Chest pain 01/14/2015  . Back pain 01/14/2015  . Renal insufficiency 01/14/2015  . Dementia- mild memory issues 01/14/2015  . Occult blood positive stool 12/14/2014  . Anemia 12/14/2014  . Family history of colon cancer 12/14/2014  . Change in bowel habits 12/14/2014  . GERD (gastroesophageal reflux disease) 12/14/2014  . Dyspnea 05/17/2012  . Lymphedema 05/17/2012  . Weight gain 05/17/2012  . HTN (hypertension) 05/17/2012   Andrey Spearman, MS, OTR/L, [facility] 06/07/20 10:36 AM  [place]  [place], [place], [address] Phone: [phone]   Fax:  [phone]  Name: [name] MRN: [id number] Date of Birth: [birth year]

## 2020-06-10 ENCOUNTER — Other Ambulatory Visit: Payer: Self-pay

## 2020-06-10 ENCOUNTER — Ambulatory Visit: Payer: Medicare Other | Admitting: Occupational Therapy

## 2020-06-10 DIAGNOSIS — I890 Lymphedema, not elsewhere classified: Secondary | ICD-10-CM | POA: Diagnosis not present

## 2020-06-10 NOTE — Therapy (Signed)
[place] MAIN [facility] SERVICES [address] [place], [place], [address] Phone: [phone]   Fax:  [phone]  Patient Details  Name: [name] MRN: [id number] Date of Birth: 1945-12-29 Referring Provider:  Juluis Pitch, MD  Encounter Date: 06/10/2020   Andrey Spearman, MS, OTR/L, [facility] 06/10/20 1:02 PM  [place] MAIN [facility] SERVICES [address] [place], [place], [address] Phone: [phone]   Fax:  [phone]

## 2020-06-12 ENCOUNTER — Other Ambulatory Visit: Payer: Self-pay

## 2020-06-12 ENCOUNTER — Ambulatory Visit: Payer: Medicare Other | Admitting: Occupational Therapy

## 2020-06-12 DIAGNOSIS — I890 Lymphedema, not elsewhere classified: Secondary | ICD-10-CM | POA: Diagnosis not present

## 2020-06-12 NOTE — Therapy (Signed)
[place] MAIN [facility] SERVICES [address] [place], [place], [address] Phone: [phone]   Fax:  [phone]  Occupational Therapy Treatment  Patient Details  Name: [name] MRN: [id number] Date of Birth: [birth year] Referring Provider (OT): Juluis Pitch, MD   Encounter Date: 06/12/2020   OT End of Session - 06/12/20 1215    Visit Number 52    Number of Visits 99    Date for OT Re-Evaluation 08/01/20    OT Start Time 0900    OT Stop Time 1005    OT Time Calculation (min) 65 min    Activity Tolerance Patient tolerated treatment well;Other (comment);No increased pain   NO SOB; no signs/ symptoms of infection   Behavior During Therapy WFL for tasks assessed/performed           Past Medical History:  Diagnosis Date   Anemia    Anxiety    Arthritis    "hands, feet" (03/03/2017)   Brain lesion    2 types   Cataract    Cervical spondylosis with myelopathy    Chest pain    Normal cardiac cath 5/09   Chronic lower back pain    CKD (chronic kidney disease), stage III    [name]   Congestive heart failure (CHF) (HCC)    has a Cardiomems implant    Constipation    Dementia ([place])    Depression    Dumping syndrome    Dyspnea    with activity   Edema    Fatigue    Fibromyalgia    GERD (gastroesophageal reflux disease)    Headache    "w/high CBG" (03/03/2017)   History of echocardiogram    a. Echo 03/20/16 (done at [facility] in [place], [place]):  mild LVH, EF 87%, normal diastolic function, mild LAE, MAC, RVSP 25 mmHg   Hyperlipidemia    Hypertension    Hypothyroidism    Lumbar spondylosis    Lymphedema    seeing specialist for this   Migraine    "mostly stopped when I changed my diet" (03/03/2017)   OSA on CPAP    Pneumonia X 1   PONV (postoperative nausea and vomiting)    Restless leg syndrome    Rheumatoid arthritis ([place])    Stroke ([place])    TIAs "mini  strokes"- unsure of last TIA - pt and daughter deny this   Syncope    Tremors of nervous system    Type II diabetes mellitus ([place])     Past Surgical History:  Procedure Laterality Date   APPENDECTOMY  1966   BACK SURGERY     CARDIAC CATHETERIZATION N/A 05/25/2016   Procedure: Right/Left Heart Cath and Coronary Angiography;  Surgeon: Larey Dresser, MD;  Location: [place] [facility];  Service: Cardiovascular;  Laterality: N/A;   CATARACT EXTRACTION W/ INTRAOCULAR LENS  IMPLANT, BILATERAL Bilateral    COLONOSCOPY     CORONARY ANGIOGRAM  2009   Normal coronaries   EXCISION/RELEASE BURSA HIP Bilateral    I & D KNEE WITH POLY EXCHANGE Left 03/02/2017   Procedure: Left knee Revision of poly liner;  Surgeon: Mcarthur Rossetti, MD;  Location: [place];  Service: Orthopedics;  Laterality: Left;   JOINT REPLACEMENT     KNEE ARTHROSCOPY Bilateral    LAPAROSCOPIC CHOLECYSTECTOMY  2015   LMF  2017   CardiMEMS HF implant for CHF (measures amount of fluid in the heart)   LUMBAR DISC  SURGERY     LUMBAR LAMINECTOMY/DECOMPRESSION MICRODISCECTOMY Left 12/2005   L2-3 laminectomy and diskectomy/notes 01/06/2011   POSTERIOR LUMBAR FUSION  04/2006   [name] 01/06/2011; "put cages in"   REVERSE SHOULDER ARTHROPLASTY Left 06/15/2017   Procedure: REVERSE LEFT SHOULDER ARTHROPLASTY;  Surgeon: Meredith Pel, MD;  Location: [place];  Service: Orthopedics;  Laterality: Left;   REVERSE SHOULDER ARTHROPLASTY Right 02/01/2018   REVERSE SHOULDER ARTHROPLASTY Right 02/01/2018   Procedure: RIGHT REVERSE SHOULDER ARTHROPLASTY;  Surgeon: Meredith Pel, MD;  Location: [place];  Service: Orthopedics;  Laterality: Right;   REVISION TOTAL KNEE ARTHROPLASTY Left 03/02/2017   poly liner/notes 03/02/2017   RIGHT HEART CATH N/A 11/13/2016   Procedure: Right Heart Cath with Cardiomems;  Surgeon: Larey Dresser, MD;  Location: [place] [facility];  Service: Cardiovascular;  Laterality: N/A;    SHOULDER ARTHROSCOPY WITH ROTATOR CUFF REPAIR Right    SHOULDER OPEN ROTATOR CUFF REPAIR Left 01/2011   [name] 01/29/2011   TOTAL ABDOMINAL HYSTERECTOMY     TOTAL KNEE ARTHROPLASTY Left    TOTAL KNEE ARTHROPLASTY Right 11/18/2012   Procedure: TOTAL KNEE ARTHROPLASTY;  Surgeon: Yvette Rack., MD;  Location: [place];  Service: Orthopedics;  Laterality: Right;   TUBAL LIGATION     UPPER GASTROINTESTINAL ENDOSCOPY     UPPER GI ENDOSCOPY      There were no vitals filed for this visit.   Subjective Assessment - 06/12/20 4599    Subjective  [name] presents to OT for Rx visit 52/72 to address BLE lipo-lymphedema (I89.0). Pt presents WITHOUT compression wraps on RLE and WITHOUT compression garment on the L. Pt reports she has not had assistance with donning compression garment in 2 days. Pt denies LE associated leg pain.    Pertinent History chronic BLE leg swelling and associated pain ~ 40 years; hx herniated intervertebral discL TKA; L total knee revision; HTN, Obesity. dementia; restless leg; CHF; unstable anina; R RC arthroplasty, OA, cervical spondylosis with myelopathy, chronic lower back pain, CKD (Stage?); Fibromyalgia; Hypothyroidism; OSA ( cPap?); hx pneumonia; RA, Type II Diabetes. S/P cardiac cathg, B THA, Lumbar laminectomy, posterior lumbar fusion, B shoulder arthroplasty; total abdominal hysterectomy    Limitations chronic leg swelling and associated pain, difficulty walking, impaired dynamic balance, muscle weakness,    Repetition Increases Symptoms    Special Tests + STEMMER sign base of toes bilaterally                        OT Treatments/Exercises (OP) - 06/12/20 0001      ADLs   ADL Education Given Yes (P)       Manual Therapy   Manual Therapy Edema management;Manual Lymphatic Drainage (MLD);Compression Bandaging (P)     Manual Lymphatic Drainage (MLD) LLE custom knee high donned with Max assist (P)     Compression Bandaging RLE compression wraps from  toes to popliteal only today (P)                   OT Education - 06/12/20 1214    Education Details Continued skilled Pt/caregiver education  And LE ADL training throughout visit for lymphedema self care/ home program, including compression wrapping, compression garment and device wear/care, lymphatic pumping ther ex, simple self-MLD, and skin care. Discussed progress towards goals.    Person(s) Educated Patient    Methods Explanation;Demonstration    Comprehension Verbalized understanding;Returned demonstration;Need further instruction  OT Long Term Goals - 06/07/20 1025      OT LONG TERM GOAL #1   Title Pt will be able to apply LLE multi-layer, short stretch compression wraps daily with maximum caregiver assistance, using correct gradient techniques independently to return affected limb/s, as closely as possible, to premorbid size and shape, to limit infection risk, and to improve safe functional mobility and ADLs performance.    Baseline dependent    Time 4    Period Days    Status Achieved   Pt has no assistance at SNF for compression wrapping. Pt launders and rolls bandages independently. Pt still requires ongoing verbal cues to lift leg and reposition wheil assistant waraps. 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     OT LONG TERM GOAL #2   Title Pt will be able to verbalize signs and symptoms of cellulitis infection and identify 4 common lymphedema precautions using printed resource for reference (modified independence) to limit LE progression over time.    Baseline Max A    Time 4    Period Days    Status Achieved      OT LONG TERM GOAL #3   Title Pt will sustain than 85% compliance with all daily LE self-care home program components throughout Intensive Phase CDT, including impeccable skin care, lymphatic pumping therex,  compression wraps and simple self MLD, to ensure optimal limb volume reduction, to limit infection risk and to limit LE  progression.    Baseline dependent    Time 12    Period Weeks    Status Partially Met   Pt 85% compliant. Staff at SNF not always available to support 85% compliance     OT LONG TERM GOAL #4   Title Pt to achieve at least 10% LLE limb volume reduction during Intensive Phase CDT with Max CG assistance for wrapping, to improve functional performance of basic and instrumental ADLs, and to limit LE progression.   to date LLE reductions: A-D= 15.7%, E-G= 29.63%, A-G= 22.9%. RLE: A-D: 6.7%, E-G= 15.7%, A-G= 11.2%   Baseline dependent    Time 12    Period Weeks    Status Partially Met   Met for all LLE segments and for full leg. Reduction goal for overall reduction of LLE limb volume to 20%. Now that RLE cellulitis is under better control we commenced CDT toLLE. By visual assessment would estimate ~  10% reduction thus far.     OT LONG TERM GOAL #5   Title During self-management phase of CDT Pt will retain limb volume reductions achieved during Intensive Phase CDT with no more than 3% volume increase using all LE self care components  to limit LE progression and further functional decline.    Baseline dependent    Time 6    Period Months    Status On-going                 Plan - 06/12/20 1216    Clinical Impression Statement Pt arrived without daytime compression garment in place this morning. Inconsistent staff assistance with donning and doffing compression garrments daily limits progress towards OT goals.Pt is unable to perform these LE ADLs due to chronic back pain, limited shoulder AROM and decreased strength and endurance. Without assistance donning/ doffing daytime compression garments essential for ongoing LE manaement, condition will progress, infection risk will increase and Pt will demonstrate further functional decline. It is essential that Pt have assistance daily with compression garments and lymphedema management devices. Pt  tolerated RLE MLD, cmpression wrapping and skin cae  without increased pain. Pt required Max assist to correctly don LLE compression garment. Cont as per POC.    Occupational performance deficits (Please refer to evaluation for details): ADL's;IADL's;Rest and Sleep;Work;Leisure;Social Participation    Rehab Potential Good    Clinical Decision Making Multiple treatment options, significant modification of task necessary    Comorbidities Affecting Occupational Performance: Presence of comorbidities impacting occupational performance    Modification or Assistance to Complete Evaluation  Min-Moderate modification of tasks or assist with assess necessary to complete eval    OT Treatment/Interventions Self-care/ADL training;Therapeutic exercise;Functional Mobility Training;Manual Therapy;Energy conservation;Manual lymph drainage;Therapeutic activities;Coping strategies training;DME and/or AE instruction;Compression bandaging;Patient/family education    Plan Complete Deconhgestive Therapy (CDT) Intensive and Self -Management phases, consisting of Manual lymphatic Drainage (MLD), skin care, ther ex and compression bandaging    OT Home Exercise Plan lymphatic pumping therex 2-3 x q day, in sequence, 10 reps bilaterally    Recommended Other Services Daytime: 2 Pr. [employer]st EVAREX custom, ccl 2, A-D, 2.5 cm SB at oblique top edge, open toe, t heel.  HOS: Jobst RELAX custom ccl 2 A-D, OT w. posterior zipper           Patient will benefit from skilled therapeutic intervention in order to improve the following deficits and impairments:           Visit Diagnosis: Lymphedema, not elsewhere classified    Problem List Patient Active Problem List   Diagnosis Date Noted   Iron deficiency 08/06/2018   History of total left knee replacement (TKR) 07/18/2018   Rectal bleeding 07/05/2018   Hemorrhoids 07/05/2018   Anal itching 07/05/2018   Rotator cuff arthropathy of right shoulder    Shoulder arthritis 06/15/2017   Status post revision of total knee  replacement, left 04/29/2017   Polyethylene liner wear following left total knee arthroplasty requiring isolated polyethylene liner exchange ([place]) 03/02/2017   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[place]) 03/02/2017   Unstable angina ([place]) 05/21/2016   Chronic diastolic heart failure ([place]) 03/29/2016   Normal coronary arteries 2009 01/14/2015   Obesity-BMI 40 01/14/2015   Restless leg 01/14/2015   Chest pain 01/14/2015   Back pain 01/14/2015   Renal insufficiency 01/14/2015   Dementia- mild memory issues 01/14/2015   Occult blood positive stool 12/14/2014   Anemia 12/14/2014   Family history of colon cancer 12/14/2014   Change in bowel habits 12/14/2014   GERD (gastroesophageal reflux disease) 12/14/2014   Dyspnea 05/17/2012   Lymphedema 05/17/2012   Weight gain 05/17/2012   HTN (hypertension) 05/17/2012    Andrey Spearman, MS, OTR/L, CLT-LANA 06/12/20 12:25 PM  [place] MAIN [facility] SERVICES [address] [place], [place], [address] Phone: [phone]   Fax:  [phone]  Name: [name] MRN: [id number] Date of Birth: [birth year]

## 2020-06-13 ENCOUNTER — Encounter: Payer: Medicare Other | Admitting: Occupational Therapy

## 2020-06-14 ENCOUNTER — Other Ambulatory Visit: Payer: Self-pay

## 2020-06-14 ENCOUNTER — Ambulatory Visit: Payer: Medicare Other | Admitting: Occupational Therapy

## 2020-06-14 DIAGNOSIS — I890 Lymphedema, not elsewhere classified: Secondary | ICD-10-CM

## 2020-06-14 NOTE — Therapy (Signed)
[place] MAIN [facility] SERVICES [address] [place], [place], [address] Phone: [phone]   Fax:  [phone]  Occupational Therapy Treatment  Patient Details  Name: [name] MRN: [id number] Date of Birth: Jan 20, 1946 Referring Provider (OT): Juluis Pitch, MD   Encounter Date: 06/14/2020   OT End of Session - 06/14/20 1105    Visit Number 80    Number of Visits 72    Date for OT Re-Evaluation 08/01/20    OT Start Time 0855    OT Stop Time 1010    OT Time Calculation (min) 75 min    Activity Tolerance Patient tolerated treatment well;Other (comment);No increased pain   NO SOB; no signs/ symptoms of infection   Behavior During Therapy WFL for tasks assessed/performed           Past Medical History:  Diagnosis Date   Anemia    Anxiety    Arthritis    "hands, feet" (03/03/2017)   Brain lesion    2 types   Cataract    Cervical spondylosis with myelopathy    Chest pain    Normal cardiac cath 5/09   Chronic lower back pain    CKD (chronic kidney disease), stage III    [name]   Congestive heart failure (CHF) (HCC)    has a Cardiomems implant    Constipation    Dementia ([place])    Depression    Dumping syndrome    Dyspnea    with activity   Edema    Fatigue    Fibromyalgia    GERD (gastroesophageal reflux disease)    Headache    "w/high CBG" (03/03/2017)   History of echocardiogram    a. Echo 03/20/16 (done at [facility] in [place], [place]):  mild LVH, EF 96%, normal diastolic function, mild LAE, MAC, RVSP 25 mmHg   Hyperlipidemia    Hypertension    Hypothyroidism    Lumbar spondylosis    Lymphedema    seeing specialist for this   Migraine    "mostly stopped when I changed my diet" (03/03/2017)   OSA on CPAP    Pneumonia X 1   PONV (postoperative nausea and vomiting)    Restless leg syndrome    Rheumatoid arthritis ([place])    Stroke ([place])    TIAs "mini  strokes"- unsure of last TIA - pt and daughter deny this   Syncope    Tremors of nervous system    Type II diabetes mellitus ([place])     Past Surgical History:  Procedure Laterality Date   APPENDECTOMY  1966   BACK SURGERY     CARDIAC CATHETERIZATION N/A 05/25/2016   Procedure: Right/Left Heart Cath and Coronary Angiography;  Surgeon: Larey Dresser, MD;  Location: [facility];  Service: Cardiovascular;  Laterality: N/A;   CATARACT EXTRACTION W/ INTRAOCULAR LENS  IMPLANT, BILATERAL Bilateral    COLONOSCOPY     CORONARY ANGIOGRAM  2009   Normal coronaries   EXCISION/RELEASE BURSA HIP Bilateral    I & D KNEE WITH POLY EXCHANGE Left 03/02/2017   Procedure: Left knee Revision of poly liner;  Surgeon: Mcarthur Rossetti, MD;  Location: [facility];  Service: Orthopedics;  Laterality: Left;   JOINT REPLACEMENT     KNEE ARTHROSCOPY Bilateral    LAPAROSCOPIC CHOLECYSTECTOMY  2015   LMF  2017   CardiMEMS HF implant for CHF (measures amount of fluid in the heart)   LUMBAR DISC  SURGERY     LUMBAR LAMINECTOMY/DECOMPRESSION MICRODISCECTOMY Left 12/2005   L2-3 laminectomy and diskectomy/notes 01/06/2011   POSTERIOR LUMBAR FUSION  04/2006   [name] 01/06/2011; "put cages in"   REVERSE SHOULDER ARTHROPLASTY Left 06/15/2017   Procedure: REVERSE LEFT SHOULDER ARTHROPLASTY;  Surgeon: Meredith Pel, MD;  Location: [facility];  Service: Orthopedics;  Laterality: Left;   REVERSE SHOULDER ARTHROPLASTY Right 02/01/2018   REVERSE SHOULDER ARTHROPLASTY Right 02/01/2018   Procedure: RIGHT REVERSE SHOULDER ARTHROPLASTY;  Surgeon: Meredith Pel, MD;  Location: [facility];  Service: Orthopedics;  Laterality: Right;   REVISION TOTAL KNEE ARTHROPLASTY Left 03/02/2017   poly liner/notes 03/02/2017   RIGHT HEART CATH N/A 11/13/2016   Procedure: Right Heart Cath with Cardiomems;  Surgeon: Larey Dresser, MD;  Location: [facility];  Service: Cardiovascular;  Laterality: N/A;    SHOULDER ARTHROSCOPY WITH ROTATOR CUFF REPAIR Right    SHOULDER OPEN ROTATOR CUFF REPAIR Left 01/2011   [name] 01/29/2011   TOTAL ABDOMINAL HYSTERECTOMY     TOTAL KNEE ARTHROPLASTY Left    TOTAL KNEE ARTHROPLASTY Right 11/18/2012   Procedure: TOTAL KNEE ARTHROPLASTY;  Surgeon: Yvette Rack., MD;  Location: [facility];  Service: Orthopedics;  Laterality: Right;   TUBAL LIGATION     UPPER GASTROINTESTINAL ENDOSCOPY     UPPER GI ENDOSCOPY      There were no vitals filed for this visit.   Subjective Assessment - 06/14/20 0998    Subjective  [name] presents to OT for Rx visit 52/72 to address BLE lipo-lymphedema (I89.0). Pt presents WITHOUT compression wraps on RLE and WITHOUT compression garment on the L. Pt reports she has not had assistance with donning compression garment in 2 days. Pt denies LE associated leg pain.    Pertinent History chronic BLE leg swelling and associated pain ~ 40 years; hx herniated intervertebral discL TKA; L total knee revision; HTN, Obesity. dementia; restless leg; CHF; unstable anina; R RC arthroplasty, OA, cervical spondylosis with myelopathy, chronic lower back pain, CKD (Stage?); Fibromyalgia; Hypothyroidism; OSA ( cPap?); hx pneumonia; RA, Type II Diabetes. S/P cardiac cathg, B THA, Lumbar laminectomy, posterior lumbar fusion, B shoulder arthroplasty; total abdominal hysterectomy    Limitations chronic leg swelling and associated pain, difficulty walking, impaired dynamic balance, muscle weakness,    Repetition Increases Symptoms    Special Tests + STEMMER sign base of toes bilaterally                        OT Treatments/Exercises (OP) - 06/14/20 0001      ADLs   ADL Education Given Yes      Manual Therapy   Manual Therapy Edema management;Manual Lymphatic Drainage (MLD);Compression Bandaging    Manual Lymphatic Drainage (MLD) RLE MLD as established    Compression Bandaging RLE compression wraps from toes to popliteal only today                   OT Education - 06/14/20 1105    Education Details Continued skilled Pt/caregiver education  And LE ADL training throughout visit for lymphedema self care/ home program, including compression wrapping, compression garment and device wear/care, lymphatic pumping ther ex, simple self-MLD, and skin care. Discussed progress towards goals.    Person(s) Educated Patient    Methods Explanation;Demonstration    Comprehension Verbalized understanding;Returned demonstration;Need further instruction               OT Long Term Goals - 06/07/20  [place] #1   Title Pt will be able to apply LLE multi-layer, short stretch compression wraps daily with maximum caregiver assistance, using correct gradient techniques independently to return affected limb/s, as closely as possible, to premorbid size and shape, to limit infection risk, and to improve safe functional mobility and ADLs performance.    Baseline dependent    Time 4    Period Days    Status Achieved   Pt has no assistance at SNF for compression wrapping. Pt launders and rolls bandages independently. Pt still requires ongoing verbal cues to lift leg and reposition wheil assistant waraps. 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     OT LONG TERM GOAL #2   Title Pt will be able to verbalize signs and symptoms of cellulitis infection and identify 4 common lymphedema precautions using printed resource for reference (modified independence) to limit LE progression over time.    Baseline Max A    Time 4    Period Days    Status Achieved      OT LONG TERM GOAL #3   Title Pt will sustain than 85% compliance with all daily LE self-care home program components throughout Intensive Phase CDT, including impeccable skin care, lymphatic pumping therex,  compression wraps and simple self MLD, to ensure optimal limb volume reduction, to limit infection risk and to limit LE progression.    Baseline dependent    Time 12     Period Weeks    Status Partially Met   Pt 85% compliant. Staff at SNF not always available to support 85% compliance     OT LONG TERM GOAL #4   Title Pt to achieve at least 10% LLE limb volume reduction during Intensive Phase CDT with Max CG assistance for wrapping, to improve functional performance of basic and instrumental ADLs, and to limit LE progression.   to date LLE reductions: A-D= 15.7%, E-G= 29.63%, A-G= 22.9%. RLE: A-D: 6.7%, E-G= 15.7%, A-G= 11.2%   Baseline dependent    Time 12    Period Weeks    Status Partially Met   Met for all LLE segments and for full leg. Reduction goal for overall reduction of LLE limb volume to 20%. Now that RLE cellulitis is under better control we commenced CDT toLLE. By visual assessment would estimate ~  10% reduction thus far.     OT LONG TERM GOAL #5   Title During self-management phase of CDT Pt will retain limb volume reductions achieved during Intensive Phase CDT with no more than 3% volume increase using all LE self care components  to limit LE progression and further functional decline.    Baseline dependent    Time 6    Period Months    Status On-going                 Plan - 06/14/20 1106    Clinical Impression Statement LLE ompression garment donned last night when Pt had help from SNF staff and she slept in garment all night. Pt instructed never to sleep in compression garments, but to use HOS device for that purpose. Pt tolerated RLE MLD, skin care and compression wrap today without increased pain. RLE ready to measure for custom garment next week.    Occupational performance deficits (Please refer to evaluation for details): ADL's;IADL's;Rest and Sleep;Work;Leisure;Social Participation    Rehab Potential Good    Clinical Decision Making Multiple treatment options, significant modification of task  necessary    Comorbidities Affecting Occupational Performance: Presence of comorbidities impacting occupational performance     Modification or Assistance to Complete Evaluation  Min-Moderate modification of tasks or assist with assess necessary to complete eval    OT Treatment/Interventions Self-care/ADL training;Therapeutic exercise;Functional Mobility Training;Manual Therapy;Energy conservation;Manual lymph drainage;Therapeutic activities;Coping strategies training;DME and/or AE instruction;Compression bandaging;Patient/family education    Plan Complete Deconhgestive Therapy (CDT) Intensive and Self -Management phases, consisting of Manual lymphatic Drainage (MLD), skin care, ther ex and compression bandaging    OT Home Exercise Plan lymphatic pumping therex 2-3 x q day, in sequence, 10 reps bilaterally    Recommended Other Services Daytime: 2 Pr. [employer]st EVAREX custom, ccl 2, A-D, 2.5 cm SB at oblique top edge, open toe, t heel.  HOS: Jobst RELAX custom ccl 2 A-D, OT w. posterior zipper           Patient will benefit from skilled therapeutic intervention in order to improve the following deficits and impairments:           Visit Diagnosis: Lymphedema, not elsewhere classified    Problem List Patient Active Problem List   Diagnosis Date Noted   Iron deficiency 08/06/2018   History of total left knee replacement (TKR) 07/18/2018   Rectal bleeding 07/05/2018   Hemorrhoids 07/05/2018   Anal itching 07/05/2018   Rotator cuff arthropathy of right shoulder    Shoulder arthritis 06/15/2017   Status post revision of total knee replacement, left 04/29/2017   Polyethylene liner wear following left total knee arthroplasty requiring isolated polyethylene liner exchange ([place]) 03/02/2017   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[place]) 03/02/2017   Unstable angina ([place]) 05/21/2016   Chronic diastolic heart failure ([place]) 03/29/2016   Normal coronary arteries 2009 01/14/2015   Obesity-BMI 40 01/14/2015   Restless leg 01/14/2015   Chest pain 01/14/2015   Back pain 01/14/2015   Renal  insufficiency 01/14/2015   Dementia- mild memory issues 01/14/2015   Occult blood positive stool 12/14/2014   Anemia 12/14/2014   Family history of colon cancer 12/14/2014   Change in bowel habits 12/14/2014   GERD (gastroesophageal reflux disease) 12/14/2014   Dyspnea 05/17/2012   Lymphedema 05/17/2012   Weight gain 05/17/2012   HTN (hypertension) 05/17/2012   Andrey Spearman, MS, OTR/L, [facility] 06/14/20 11:09 AM   [place] [facility]IN [facility] SERVICES [address] [place], [place], [address] Phone: [phone]   Fax:  [phone]  Name: [name] MRN: [id number] Date of Birth: [birth year]

## 2020-06-17 ENCOUNTER — Other Ambulatory Visit: Payer: Self-pay

## 2020-06-17 ENCOUNTER — Ambulatory Visit: Payer: Medicare Other | Admitting: Occupational Therapy

## 2020-06-17 DIAGNOSIS — I890 Lymphedema, not elsewhere classified: Secondary | ICD-10-CM | POA: Diagnosis not present

## 2020-06-17 NOTE — Therapy (Signed)
[place] MAIN [facility] SERVICES [address] [place], [place], [address] Phone: [phone]   Fax:  [phone]  Occupational Therapy Treatment  Patient Details  Name: [name] MRN: [id number] Date of Birth: 15-Dec-1945 Referring Provider (OT): Juluis Pitch, MD   Encounter Date: 06/17/2020   OT End of Session - 06/17/20 1702    Visit Number 93    Number of Visits 72    Date for OT Re-Evaluation 08/01/20    OT Start Time 0910    OT Stop Time 1005    OT Time Calculation (min) 55 min    Activity Tolerance Patient tolerated treatment well;Other (comment);No increased pain   NO SOB; no signs/ symptoms of infection   Behavior During Therapy WFL for tasks assessed/performed           Past Medical History:  Diagnosis Date  . Anemia   . Anxiety   . Arthritis    "hands, feet" (03/03/2017)  . Brain lesion    2 types  . Cataract   . Cervical spondylosis with myelopathy   . Chest pain    Normal cardiac cath 5/09  . Chronic lower back pain   . CKD (chronic kidney disease), stage III    [name]  . Congestive heart failure (CHF) ([place])    has a Cardiomems implant   . Constipation   . Dementia ([place])   . Depression   . Dumping syndrome   . Dyspnea    with activity  . Edema   . Fatigue   . Fibromyalgia   . GERD (gastroesophageal reflux disease)   . Headache    "w/high CBG" (03/03/2017)  . History of echocardiogram    a. Echo 03/20/16 (done at [facility] in [place], [place]):  mild LVH, EF 69%, normal diastolic function, mild LAE, MAC, RVSP 25 mmHg  . Hyperlipidemia   . Hypertension   . Hypothyroidism   . Lumbar spondylosis   . Lymphedema    seeing specialist for this  . Migraine    "mostly stopped when I changed my diet" (03/03/2017)  . OSA on CPAP   . Pneumonia X 1  . PONV (postoperative nausea and vomiting)   . Restless leg syndrome   . Rheumatoid arthritis ([place])   . Stroke [facility])    TIAs "mini  strokes"- unsure of last TIA - pt and daughter deny this  . Syncope   . Tremors of nervous system   . Type II diabetes mellitus ([place])     Past Surgical History:  Procedure Laterality Date  . APPENDECTOMY  1966  . BACK SURGERY    . CARDIAC CATHETERIZATION N/A 05/25/2016   Procedure: Right/Left Heart Cath and Coronary Angiography;  Surgeon: Larey Dresser, MD;  Location: [place] CV [facility];  Service: Cardiovascular;  Laterality: N/A;  . CATARACT EXTRACTION W/ INTRAOCULAR LENS  IMPLANT, BILATERAL Bilateral   . COLONOSCOPY    . CORONARY ANGIOGRAM  2009   Normal coronaries  . EXCISION/RELEASE BURSA HIP Bilateral   . I & D KNEE WITH POLY EXCHANGE Left 03/02/2017   Procedure: Left knee Revision of poly liner;  Surgeon: Mcarthur Rossetti, MD;  Location: [place];  Service: Orthopedics;  Laterality: Left;  . JOINT REPLACEMENT    . KNEE ARTHROSCOPY Bilateral   . LAPAROSCOPIC CHOLECYSTECTOMY  2015  . LMF  2017   CardiMEMS HF implant for CHF (measures amount of fluid in the heart)  . LUMBAR DISC  SURGERY    . LUMBAR LAMINECTOMY/DECOMPRESSION MICRODISCECTOMY Left 12/2005   L2-3 laminectomy and diskectomy/notes 01/06/2011  . POSTERIOR LUMBAR FUSION  04/2006   [name] 01/06/2011; "put cages in"  . REVERSE SHOULDER ARTHROPLASTY Left 06/15/2017   Procedure: REVERSE LEFT SHOULDER ARTHROPLASTY;  Surgeon: Meredith Pel, MD;  Location: [place];  Service: Orthopedics;  Laterality: Left;  . REVERSE SHOULDER ARTHROPLASTY Right 02/01/2018  . REVERSE SHOULDER ARTHROPLASTY Right 02/01/2018   Procedure: RIGHT REVERSE SHOULDER ARTHROPLASTY;  Surgeon: Meredith Pel, MD;  Location: [place];  Service: Orthopedics;  Laterality: Right;  . REVISION TOTAL KNEE ARTHROPLASTY Left 03/02/2017   poly liner/notes 03/02/2017  . RIGHT HEART CATH N/A 11/13/2016   Procedure: Right Heart Cath with Cardiomems;  Surgeon: Larey Dresser, MD;  Location: [place] [facility];  Service: Cardiovascular;  Laterality: N/A;  .  SHOULDER ARTHROSCOPY WITH ROTATOR CUFF REPAIR Right   . SHOULDER OPEN ROTATOR CUFF REPAIR Left 01/2011   [name] 01/29/2011  . TOTAL ABDOMINAL HYSTERECTOMY    . TOTAL KNEE ARTHROPLASTY Left   . TOTAL KNEE ARTHROPLASTY Right 11/18/2012   Procedure: TOTAL KNEE ARTHROPLASTY;  Surgeon: Yvette Rack., MD;  Location: [place];  Service: Orthopedics;  Laterality: Right;  . TUBAL LIGATION    . UPPER GASTROINTESTINAL ENDOSCOPY    . UPPER GI ENDOSCOPY      There were no vitals filed for this visit.   Subjective Assessment - 06/17/20 1653    Subjective  [name] presents to OT for Rx visit 53/72 to address BLE lipo-lymphedema (I89.0). Pt presents WITHOUT compression wraps on RLE and WITH compression garment on the L. Pt reports she reports she had assistance over the weekend with compression garments and Flexitouch device.Pt has no new complaints and denies LE related legpain today..    Pertinent History chronic BLE leg swelling and associated pain ~ 40 years; hx herniated intervertebral discL TKA; L total knee revision; HTN, Obesity. dementia; restless leg; CHF; unstable anina; R RC arthroplasty, OA, cervical spondylosis with myelopathy, chronic lower back pain, CKD (Stage?); Fibromyalgia; Hypothyroidism; OSA ( cPap?); hx pneumonia; RA, Type II Diabetes. S/P cardiac cathg, B THA, Lumbar laminectomy, posterior lumbar fusion, B shoulder arthroplasty; total abdominal hysterectomy    Limitations chronic leg swelling and associated pain, difficulty walking, impaired dynamic balance, muscle weakness,    Repetition Increases Symptoms    Special Tests + STEMMER sign base of toes bilaterally                        OT Treatments/Exercises (OP) - 06/17/20 0001      ADLs   ADL Education Given Yes      Manual Therapy   Manual Therapy Edema management;Manual Lymphatic Drainage (MLD);Compression Bandaging    Edema Management completed RLE anatomical measurements for custom daytime compression garments  and HOS device. Faxed to DME vendor.    Compression Bandaging RLE compression wraps from toes to popliteal only today                  OT Education - 06/17/20 1701    Education Details Continued skilled Pt/caregiver education  And LE ADL training throughout visit for lymphedema self care/ home program, including compression wrapping, compression garment and device wear/care, lymphatic pumping ther ex, simple self-MLD, and skin care. Discussed progress towards goals.    Person(s) Educated Patient    Methods Explanation;Demonstration    Comprehension Verbalized understanding;Returned demonstration;Need further instruction  OT Long Term Goals - 06/07/20 1025      OT LONG TERM GOAL #1   Title Pt will be able to apply LLE multi-layer, short stretch compression wraps daily with maximum caregiver assistance, using correct gradient techniques independently to return affected limb/s, as closely as possible, to premorbid size and shape, to limit infection risk, and to improve safe functional mobility and ADLs performance.    Baseline dependent    Time 4    Period Days    Status Achieved   Pt has no assistance at SNF for compression wrapping. Pt launders and rolls bandages independently. Pt still requires ongoing verbal cues to lift leg and reposition wheil assistant waraps. 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     OT LONG TERM GOAL #2   Title Pt will be able to verbalize signs and symptoms of cellulitis infection and identify 4 common lymphedema precautions using printed resource for reference (modified independence) to limit LE progression over time.    Baseline Max A    Time 4    Period Days    Status Achieved      OT LONG TERM GOAL #3   Title Pt will sustain than 85% compliance with all daily LE self-care home program components throughout Intensive Phase CDT, including impeccable skin care, lymphatic pumping therex,  compression wraps and simple self MLD, to  ensure optimal limb volume reduction, to limit infection risk and to limit LE progression.    Baseline dependent    Time 12    Period Weeks    Status Partially Met   Pt 85% compliant. Staff at SNF not always available to support 85% compliance     OT LONG TERM GOAL #4   Title Pt to achieve at least 10% LLE limb volume reduction during Intensive Phase CDT with Max CG assistance for wrapping, to improve functional performance of basic and instrumental ADLs, and to limit LE progression.   to date LLE reductions: A-D= 15.7%, E-G= 29.63%, A-G= 22.9%. RLE: A-D: 6.7%, E-G= 15.7%, A-G= 11.2%   Baseline dependent    Time 12    Period Weeks    Status Partially Met   Met for all LLE segments and for full leg. Reduction goal for overall reduction of LLE limb volume to 20%. Now that RLE cellulitis is under better control we commenced CDT toLLE. By visual assessment would estimate ~  10% reduction thus far.     OT LONG TERM GOAL #5   Title During self-management phase of CDT Pt will retain limb volume reductions achieved during Intensive Phase CDT with no more than 3% volume increase using all LE self care components  to limit LE progression and further functional decline.    Baseline dependent    Time 6    Period Months    Status On-going                 Plan - 06/17/20 1702    Clinical Impression Statement Lymphedema well managed bilaterally today. Completed anatomical measurements for RLE custom compression garment and HOS device. [employer] faxed to DME vendor to order. Discussed plan to reduce OT Rx frequency after RLE garment fitting is complete as she transitions to management phase CDT. Cont as per POC.    Occupational performance deficits (Please refer to evaluation for details): ADL's;IADL's;Rest and Sleep;Work;Leisure;Social Participation    Rehab Potential Good    Clinical Decision Making Multiple treatment options, significant modification of task necessary    Comorbidities  Affecting  Occupational Performance: Presence of comorbidities impacting occupational performance    Modification or Assistance to Complete Evaluation  Min-Moderate modification of tasks or assist with assess necessary to complete eval    OT Treatment/Interventions Self-care/ADL training;Therapeutic exercise;Functional Mobility Training;Manual Therapy;Energy conservation;Manual lymph drainage;Therapeutic activities;Coping strategies training;DME and/or AE instruction;Compression bandaging;Patient/family education    Plan Complete Deconhgestive Therapy (CDT) Intensive and Self -Management phases, consisting of Manual lymphatic Drainage (MLD), skin care, ther ex and compression bandaging    OT Home Exercise Plan lymphatic pumping therex 2-3 x q day, in sequence, 10 reps bilaterally    Recommended Other Services Daytime: 2 Pr. Jobst EVAREX custom, ccl 2, A-D, 2.5 cm SB at oblique top edge, open toe, t heel.  HOS: Jobst RELAX custom ccl 2 A-D, OT w. posterior zipper           Patient will benefit from skilled therapeutic intervention in order to improve the following deficits and impairments:           Visit Diagnosis: Lymphedema, not elsewhere classified    Problem List Patient Active Problem List   Diagnosis Date Noted  . Iron deficiency 08/06/2018  . History of total left knee replacement (TKR) 07/18/2018  . Rectal bleeding 07/05/2018  . Hemorrhoids 07/05/2018  . Anal itching 07/05/2018  . Rotator cuff arthropathy of right shoulder   . Shoulder arthritis 06/15/2017  . Status post revision of total knee replacement, left 04/29/2017  . Polyethylene liner wear following left total knee arthroplasty requiring isolated polyethylene liner exchange ([place]) 03/02/2017  . 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 Unstable angina ([place]) 05/21/2016  . Chronic diastolic heart failure ([place]) 03/29/2016  . Normal coronary arteries 2009 01/14/2015  . Obesity-BMI 40 01/14/2015  .  Restless leg 01/14/2015  . Chest pain 01/14/2015  . Back pain 01/14/2015  . Renal insufficiency 01/14/2015  . Dementia- mild memory issues 01/14/2015  . Occult blood positive stool 12/14/2014  . Anemia 12/14/2014  . Family history of colon cancer 12/14/2014  . Change in bowel habits 12/14/2014  . GERD (gastroesophageal reflux disease) 12/14/2014  . Dyspnea 05/17/2012  . Lymphedema 05/17/2012  . Weight gain 05/17/2012  . HTN (hypertension) 05/17/2012    Andrey Spearman, MS, OTR/L, [facility] 06/17/20 5:05 PM  [place] MAIN [facility] SERVICES [address] [place], [place], [address] Phone: [phone]   Fax:  [phone]  Name: [name] MRN: [id number] Date of Birth: March 03, 1946

## 2020-06-19 ENCOUNTER — Encounter: Payer: Self-pay | Admitting: Occupational Therapy

## 2020-06-19 ENCOUNTER — Other Ambulatory Visit: Payer: Self-pay

## 2020-06-19 ENCOUNTER — Ambulatory Visit: Payer: Medicare Other | Admitting: Occupational Therapy

## 2020-06-19 DIAGNOSIS — I890 Lymphedema, not elsewhere classified: Secondary | ICD-10-CM | POA: Diagnosis not present

## 2020-06-19 NOTE — Therapy (Signed)
[place] MAIN [facility] SERVICES [address] [place], [place], [address] Phone: [phone]   Fax:  [phone]  Occupational Therapy Treatment  Patient Details  Name: [name] MRN: [id number] Date of Birth: 1946-07-22 Referring Provider (OT): Juluis Pitch, MD   Encounter Date: 06/19/2020   OT End of Session - 06/19/20 1242    Visit Number 55    Number of Visits 40    Date for OT Re-Evaluation 08/01/20    OT Start Time 0905    OT Stop Time 1010    OT Time Calculation (min) 65 min    Activity Tolerance Patient tolerated treatment well;Other (comment);No increased pain   NO SOB; no signs/ symptoms of infection   Behavior During Therapy WFL for tasks assessed/performed           Past Medical History:  Diagnosis Date   Anemia    Anxiety    Arthritis    "hands, feet" (03/03/2017)   Brain lesion    2 types   Cataract    Cervical spondylosis with myelopathy    Chest pain    Normal cardiac cath 5/09   Chronic lower back pain    CKD (chronic kidney disease), stage III ([place])    [name]   Congestive heart failure (CHF) ([place])    has a Cardiomems implant    Constipation    Dementia ([place])    Depression    Dumping syndrome    Dyspnea    with activity   Edema    Fatigue    Fibromyalgia    GERD (gastroesophageal reflux disease)    Headache    "w/high CBG" (03/03/2017)   History of echocardiogram    a. Echo 03/20/16 (done at [facility] in [place], [place]):  mild LVH, EF 18%, normal diastolic function, mild LAE, MAC, RVSP 25 mmHg   Hyperlipidemia    Hypertension    Hypothyroidism    Lumbar spondylosis    Lymphedema    seeing specialist for this   Migraine    "mostly stopped when I changed my diet" (03/03/2017)   OSA on CPAP    Pneumonia X 1   PONV (postoperative nausea and vomiting)    Restless leg syndrome    Rheumatoid arthritis ([place])    Stroke ([place])    TIAs "mini  strokes"- unsure of last TIA - pt and daughter deny this   Syncope    Tremors of nervous system    Type II diabetes mellitus ([place])     Past Surgical History:  Procedure Laterality Date   APPENDECTOMY  1966   BACK SURGERY     CARDIAC CATHETERIZATION N/A 05/25/2016   Procedure: Right/Left Heart Cath and Coronary Angiography;  Surgeon: Larey Dresser, MD;  Location: [place] [facility];  Service: Cardiovascular;  Laterality: N/A;   CATARACT EXTRACTION W/ INTRAOCULAR LENS  IMPLANT, BILATERAL Bilateral    COLONOSCOPY     CORONARY ANGIOGRAM  2009   Normal coronaries   EXCISION/RELEASE BURSA HIP Bilateral    I & D KNEE WITH POLY EXCHANGE Left 03/02/2017   Procedure: Left knee Revision of poly liner;  Surgeon: Mcarthur Rossetti, MD;  Location: [place];  Service: Orthopedics;  Laterality: Left;   JOINT REPLACEMENT     KNEE ARTHROSCOPY Bilateral    LAPAROSCOPIC CHOLECYSTECTOMY  2015   LMF  2017   CardiMEMS HF implant for CHF (measures amount of fluid in the heart)   LUMBAR  DISC SURGERY     LUMBAR LAMINECTOMY/DECOMPRESSION MICRODISCECTOMY Left 12/2005   L2-3 laminectomy and diskectomy/notes 01/06/2011   POSTERIOR LUMBAR FUSION  04/2006   [name] 01/06/2011; "put cages in"   REVERSE SHOULDER ARTHROPLASTY Left 06/15/2017   Procedure: REVERSE LEFT SHOULDER ARTHROPLASTY;  Surgeon: Meredith Pel, MD;  Location: [place];  Service: Orthopedics;  Laterality: Left;   REVERSE SHOULDER ARTHROPLASTY Right 02/01/2018   REVERSE SHOULDER ARTHROPLASTY Right 02/01/2018   Procedure: RIGHT REVERSE SHOULDER ARTHROPLASTY;  Surgeon: Meredith Pel, MD;  Location: [place];  Service: Orthopedics;  Laterality: Right;   REVISION TOTAL KNEE ARTHROPLASTY Left 03/02/2017   poly liner/notes 03/02/2017   RIGHT HEART CATH N/A 11/13/2016   Procedure: Right Heart Cath with Cardiomems;  Surgeon: Larey Dresser, MD;  Location: [place] [facility];  Service: Cardiovascular;  Laterality: N/A;    SHOULDER ARTHROSCOPY WITH ROTATOR CUFF REPAIR Right    SHOULDER OPEN ROTATOR CUFF REPAIR Left 01/2011   [name] 01/29/2011   TOTAL ABDOMINAL HYSTERECTOMY     TOTAL KNEE ARTHROPLASTY Left    TOTAL KNEE ARTHROPLASTY Right 11/18/2012   Procedure: TOTAL KNEE ARTHROPLASTY;  Surgeon: Yvette Rack., MD;  Location: [place];  Service: Orthopedics;  Laterality: Right;   TUBAL LIGATION     UPPER GASTROINTESTINAL ENDOSCOPY     UPPER GI ENDOSCOPY      There were no vitals filed for this visit.   Subjective Assessment - 06/19/20 0912    Subjective  [name] presents to OT for Rx visit 54/72 to address BLE lipo-lymphedema (I89.0). Pt presents without compression wraps on RLE and WITH compression garment on the L. Pt reports L knee pain 7/10.    Pertinent History chronic BLE leg swelling and associated pain ~ 40 years; hx herniated intervertebral discL TKA; L total knee revision; HTN, Obesity. dementia; restless leg; CHF; unstable anina; R RC arthroplasty, OA, cervical spondylosis with myelopathy, chronic lower back pain, CKD (Stage?); Fibromyalgia; Hypothyroidism; OSA ( cPap?); hx pneumonia; RA, Type II Diabetes. S/P cardiac cathg, B THA, Lumbar laminectomy, posterior lumbar fusion, B shoulder arthroplasty; total abdominal hysterectomy    Limitations chronic leg swelling and associated pain, difficulty walking, impaired dynamic balance, muscle weakness,    Repetition Increases Symptoms    Special Tests + STEMMER sign base of toes bilaterally                        OT Treatments/Exercises (OP) - 06/19/20 0001      ADLs   ADL Education Given Yes (P)                   OT Education - 06/19/20 1242    Education Details Continued skilled Pt/caregiver education  And LE ADL training throughout visit for lymphedema self care/ home program, including compression wrapping, compression garment and device wear/care, lymphatic pumping ther ex, simple self-MLD, and skin care. Discussed  progress towards goals.    Person(s) Educated Patient    Methods Explanation;Demonstration    Comprehension Verbalized understanding;Returned demonstration;Need further instruction               OT Long Term Goals - 06/07/20 1025      OT LONG TERM GOAL #1   Title Pt will be able to apply LLE multi-layer, short stretch compression wraps daily with maximum caregiver assistance, using correct gradient techniques independently to return affected limb/s, as closely as possible, to premorbid size and shape, to limit infection risk,  and to improve safe functional mobility and ADLs performance.    Baseline dependent    Time 4    Period Days    Status Achieved   Pt has no assistance at SNF for compression wrapping. Pt launders and rolls bandages independently. Pt still requires ongoing verbal cues to lift leg and reposition wheil assistant waraps. 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     OT LONG TERM GOAL #2   Title Pt will be able to verbalize signs and symptoms of cellulitis infection and identify 4 common lymphedema precautions using printed resource for reference (modified independence) to limit LE progression over time.    Baseline Max A    Time 4    Period Days    Status Achieved      OT LONG TERM GOAL #3   Title Pt will sustain than 85% compliance with all daily LE self-care home program components throughout Intensive Phase CDT, including impeccable skin care, lymphatic pumping therex,  compression wraps and simple self MLD, to ensure optimal limb volume reduction, to limit infection risk and to limit LE progression.    Baseline dependent    Time 12    Period Weeks    Status Partially Met   Pt 85% compliant. Staff at SNF not always available to support 85% compliance     OT LONG TERM GOAL #4   Title Pt to achieve at least 10% LLE limb volume reduction during Intensive Phase CDT with Max CG assistance for wrapping, to improve functional performance of basic and instrumental  ADLs, and to limit LE progression.   to date LLE reductions: A-D= 15.7%, E-G= 29.63%, A-G= 22.9%. RLE: A-D: 6.7%, E-G= 15.7%, A-G= 11.2%   Baseline dependent    Time 12    Period Weeks    Status Partially Met   Met for all LLE segments and for full leg. Reduction goal for overall reduction of LLE limb volume to 20%. Now that RLE cellulitis is under better control we commenced CDT toLLE. By visual assessment would estimate ~  10% reduction thus far.     OT LONG TERM GOAL #5   Title During self-management phase of CDT Pt will retain limb volume reductions achieved during Intensive Phase CDT with no more than 3% volume increase using all LE self care components  to limit LE progression and further functional decline.    Baseline dependent    Time 6    Period Months    Status On-going                 Plan - 06/19/20 1244    Clinical Impression Statement L knee pain 2/2 OA. Provided moist heat pack for cervical pain during MLD to faciliate relaxation and abdominal breathing. Pt tolerated MLD, skin care and knee length gradient compression wrap to RLE without increased pain. [employer] RLE garment fitting pending delivery. Cont as per POC.    Occupational performance deficits (Please refer to evaluation for details): ADL's;IADL's;Rest and Sleep;Work;Leisure;Social Participation    Rehab Potential Good    Clinical Decision Making Multiple treatment options, significant modification of task necessary    Comorbidities Affecting Occupational Performance: Presence of comorbidities impacting occupational performance    Modification or Assistance to Complete Evaluation  Min-Moderate modification of tasks or assist with assess necessary to complete eval    OT Treatment/Interventions Self-care/ADL training;Therapeutic exercise;Functional Mobility Training;Manual Therapy;Energy conservation;Manual lymph drainage;Therapeutic activities;Coping strategies training;DME and/or AE instruction;Compression  bandaging;Patient/family education    Plan Complete Deconhgestive  Therapy (CDT) Intensive and Self -Management phases, consisting of Manual lymphatic Drainage (MLD), skin care, ther ex and compression bandaging    OT Home Exercise Plan lymphatic pumping therex 2-3 x q day, in sequence, 10 reps bilaterally    Recommended Other Services Daytime: 2 Pr. Jobst EVAREX custom, ccl 2, A-D, 2.5 cm SB at oblique top edge, open toe, t heel.  HOS: Jobst RELAX custom ccl 2 A-D, OT w. posterior zipper           Patient will benefit from skilled therapeutic intervention in order to improve the following deficits and impairments:           Visit Diagnosis: Lymphedema, not elsewhere classified    Problem List Patient Active Problem List   Diagnosis Date Noted   Iron deficiency 08/06/2018   History of total left knee replacement (TKR) 07/18/2018   Rectal bleeding 07/05/2018   Hemorrhoids 07/05/2018   Anal itching 07/05/2018   Rotator cuff arthropathy of right shoulder    Shoulder arthritis 06/15/2017   Status post revision of total knee replacement, left 04/29/2017   Polyethylene liner wear following left total knee arthroplasty requiring isolated polyethylene liner exchange ([place]) 03/02/2017   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[place]) 03/02/2017   Unstable angina ([place]) 05/21/2016   Chronic diastolic heart failure ([place]) 03/29/2016   Normal coronary arteries 2009 01/14/2015   Obesity-BMI 40 01/14/2015   Restless leg 01/14/2015   Chest pain 01/14/2015   Back pain 01/14/2015   Renal insufficiency 01/14/2015   Dementia- mild memory issues 01/14/2015   Occult blood positive stool 12/14/2014   Anemia 12/14/2014   Family history of colon cancer 12/14/2014   Change in bowel habits 12/14/2014   GERD (gastroesophageal reflux disease) 12/14/2014   Dyspnea 05/17/2012   Lymphedema 05/17/2012   Weight gain 05/17/2012   HTN (hypertension) 05/17/2012     Andrey Spearman, MS, OTR/L, CLT-LANA 06/19/20 12:48 PM   [place] MAIN [facility] SERVICES [address] [place], [place], [address] Phone: [phone]   Fax:  [phone]  Name: [name] MRN: [id number] Date of Birth: [birth year]

## 2020-06-20 ENCOUNTER — Encounter: Payer: Medicare Other | Admitting: Occupational Therapy

## 2020-06-21 ENCOUNTER — Ambulatory Visit: Payer: Medicare Other | Admitting: Occupational Therapy

## 2020-06-21 ENCOUNTER — Other Ambulatory Visit: Payer: Self-pay

## 2020-06-21 DIAGNOSIS — I890 Lymphedema, not elsewhere classified: Secondary | ICD-10-CM | POA: Diagnosis not present

## 2020-06-21 NOTE — Therapy (Signed)
[place] MAIN [facility] SERVICES [address] [place], [place], [address] Phone: [phone]   Fax:  [phone]  Occupational Therapy Treatment  Patient Details  Name: [name] MRN: [id number] Date of Birth: 1946-03-03 Referring Provider (OT): Juluis Pitch, MD   Encounter Date: 06/21/2020   OT End of Session - 06/21/20 0924    Visit Number 56    Number of Visits 72    Date for OT Re-Evaluation 08/01/20    OT Start Time 0910    OT Stop Time 1000    OT Time Calculation (min) 50 min    Activity Tolerance Patient tolerated treatment well;Other (comment);No increased pain   NO SOB; no signs/ symptoms of infection   Behavior During Therapy WFL for tasks assessed/performed           Past Medical History:  Diagnosis Date  . Anemia   . Anxiety   . Arthritis    "hands, feet" (03/03/2017)  . Brain lesion    2 types  . Cataract   . Cervical spondylosis with myelopathy   . Chest pain    Normal cardiac cath 5/09  . Chronic lower back pain   . CKD (chronic kidney disease), stage III ([place])    [name]  . Congestive heart failure (CHF) ([place])    has a Cardiomems implant   . Constipation   . Dementia ([place])   . Depression   . Dumping syndrome   . Dyspnea    with activity  . Edema   . Fatigue   . Fibromyalgia   . GERD (gastroesophageal reflux disease)   . Headache    "w/high CBG" (03/03/2017)  . History of echocardiogram    a. Echo 03/20/16 (done at [facility] in [place], [place]):  mild LVH, EF 23%, normal diastolic function, mild LAE, MAC, RVSP 25 mmHg  . Hyperlipidemia   . Hypertension   . Hypothyroidism   . Lumbar spondylosis   . Lymphedema    seeing specialist for this  . Migraine    "mostly stopped when I changed my diet" (03/03/2017)  . OSA on CPAP   . Pneumonia X 1  . PONV (postoperative nausea and vomiting)   . Restless leg syndrome   . Rheumatoid arthritis ([place])   . Stroke [facility])    TIAs "mini  strokes"- unsure of last TIA - pt and daughter deny this  . Syncope   . Tremors of nervous system   . Type II diabetes mellitus ([place])     Past Surgical History:  Procedure Laterality Date  . APPENDECTOMY  1966  . BACK SURGERY    . CARDIAC CATHETERIZATION N/A 05/25/2016   Procedure: Right/Left Heart Cath and Coronary Angiography;  Surgeon: Larey Dresser, MD;  Location: [place] CV LAB;  Service: Cardiovascular;  Laterality: N/A;  . CATARACT EXTRACTION W/ INTRAOCULAR LENS  IMPLANT, BILATERAL Bilateral   . COLONOSCOPY    . CORONARY ANGIOGRAM  2009   Normal coronaries  . EXCISION/RELEASE BURSA HIP Bilateral   . I & D KNEE WITH POLY EXCHANGE Left 03/02/2017   Procedure: Left knee Revision of poly liner;  Surgeon: Mcarthur Rossetti, MD;  Location: [place];  Service: Orthopedics;  Laterality: Left;  . JOINT REPLACEMENT    . KNEE ARTHROSCOPY Bilateral   . LAPAROSCOPIC CHOLECYSTECTOMY  2015  . LMF  2017   CardiMEMS HF implant for CHF (measures amount of fluid in the heart)  . LUMBAR  [place] SUR[facility]ERY    . LUMBAR LAMINECTOMY/DECOMPRESSION MICRODISCECTOMY Left 12/2005   L2-3 laminectomy and diskectomy/notes 01/06/2011  . POSTERIOR LUMBAR FUSION  04/2006   [name] 01/06/2011; "put cages in"  . REVERSE SHOULDER ARTHROPLASTY Left 06/15/2017   Procedure: REVERSE LEFT SHOULDER ARTHROPLASTY;  Surgeon: Meredith Pel, MD;  Location: [place];  Service: Orthopedics;  Laterality: Left;  . REVERSE SHOULDER ARTHROPLASTY Right 02/01/2018  . REVERSE SHOULDER ARTHROPLASTY Right 02/01/2018   Procedure: RIGHT REVERSE SHOULDER ARTHROPLASTY;  Surgeon: Meredith Pel, MD;  Location: [place];  Service: Orthopedics;  Laterality: Right;  . REVISION TOTAL KNEE ARTHROPLASTY Left 03/02/2017   poly liner/notes 03/02/2017  . RIGHT HEART CATH N/A 11/13/2016   Procedure: Right Heart Cath with Cardiomems;  Surgeon: Larey Dresser, MD;  Location: [place] [facility] LAB;  Service: Cardiovascular;  Laterality: N/A;  .  SHOULDER ARTHROSCOPY WITH ROTATOR CUFF REPAIR Right   . SHOULDER OPEN ROTATOR CUFF REPAIR Left 01/2011   [name] 01/29/2011  . TOTAL ABDOMINAL HYSTERECTOMY    . TOTAL KNEE ARTHROPLASTY Left   . TOTAL KNEE ARTHROPLASTY Right 11/18/2012   Procedure: TOTAL KNEE ARTHROPLASTY;  Surgeon: Yvette Rack., MD;  Location: [place];  Service: Orthopedics;  Laterality: Right;  . TUBAL LIGATION    . UPPER GASTROINTESTINAL ENDOSCOPY    . UPPER GI ENDOSCOPY      There were no vitals filed for this visit.   Subjective Assessment - 06/21/20 0925    Subjective  [name] presents to OT for Rx visit 55/72 to address BLE lipo-lymphedema (I89.0). Pt presents without compression wraps on RLE and WITH compression garment on the L. Pt reports OA pain is unchanged and L shoulder is painful this morning. Pt did not rate pain numerivcally this morning.    Pertinent History chronic BLE leg swelling and associated pain ~ 40 years; hx herniated intervertebral discL TKA; L total knee revision; HTN, Obesity. dementia; restless leg; CHF; unstable anina; R RC arthroplasty, OA, cervical spondylosis with myelopathy, chronic lower back pain, CKD (Stage?); Fibromyalgia; Hypothyroidism; OSA ( cPap?); hx pneumonia; RA, Type II Diabetes. S/P cardiac cathg, B THA, Lumbar laminectomy, posterior lumbar fusion, B shoulder arthroplasty; total abdominal hysterectomy    Limitations chronic leg swelling and associated pain, difficulty walking, impaired dynamic balance, muscle weakness,    Repetition Increases Symptoms    Special Tests + STEMMER sign base of toes bilaterally                        OT Treatments/Exercises (OP) - 06/21/20 0001      ADLs   ADL Education Given Yes      Manual Therapy   Manual Therapy Edema management;Manual Lymphatic Drainage (MLD);Compression Bandaging    Manual Lymphatic Drainage (MLD) RLE MLD as established    Compression Bandaging RLE compression wraps from toes to popliteal only today                   OT Education - 06/21/20 0926    Education Details Continued skilled Pt/caregiver education  And LE ADL training throughout visit for lymphedema self care/ home program, including compression wrapping, compression garment and device wear/care, lymphatic pumping ther ex, simple self-MLD, and skin care. Discussed progress towards goals.    Person(s) Educated Patient    Methods Explanation;Demonstration    Comprehension Verbalized understanding;Returned demonstration;Need further instruction               OT Long Term Goals -  06/07/20 1025      OT LONG TERM GOAL #1   Title Pt will be able to apply LLE multi-layer, short stretch compression wraps daily with maximum caregiver assistance, using correct gradient techniques independently to return affected limb/s, as closely as possible, to premorbid size and shape, to limit infection risk, and to improve safe functional mobility and ADLs performance.    Baseline dependent    Time 4    Period Days    Status Achieved   Pt has no assistance at SNF for compression wrapping. Pt launders and rolls bandages independently. Pt still requires ongoing verbal cues to lift leg and reposition wheil assistant waraps. 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     OT LONG TERM GOAL #2   Title Pt will be able to verbalize signs and symptoms of cellulitis infection and identify 4 common lymphedema precautions using printed resource for reference (modified independence) to limit LE progression over time.    Baseline Max A    Time 4    Period Days    Status Achieved      OT LONG TERM GOAL #3   Title Pt will sustain than 85% compliance with all daily LE self-care home program components throughout Intensive Phase CDT, including impeccable skin care, lymphatic pumping therex,  compression wraps and simple self MLD, to ensure optimal limb volume reduction, to limit infection risk and to limit LE progression.    Baseline dependent    Time 12     Period Weeks    Status Partially Met   Pt 85% compliant. Staff at SNF not always available to support 85% compliance     OT LONG TERM GOAL #4   Title Pt to achieve at least 10% LLE limb volume reduction during Intensive Phase CDT with Max CG assistance for wrapping, to improve functional performance of basic and instrumental ADLs, and to limit LE progression.   to date LLE reductions: A-D= 15.7%, E-G= 29.63%, A-G= 22.9%. RLE: A-D: 6.7%, E-G= 15.7%, A-G= 11.2%   Baseline dependent    Time 12    Period Weeks    Status Partially Met   Met for all LLE segments and for full leg. Reduction goal for overall reduction of LLE limb volume to 20%. Now that RLE cellulitis is under better control we commenced CDT toLLE. By visual assessment would estimate ~  10% reduction thus far.     OT LONG TERM GOAL #5   Title During self-management phase of CDT Pt will retain limb volume reductions achieved during Intensive Phase CDT with no more than 3% volume increase using all LE self care components  to limit LE progression and further functional decline.    Baseline dependent    Time 6    Period Months    Status On-going                 Plan - 06/21/20 1208    Clinical Impression Statement RLE skin care, MLD and compression wrapping without increased pain. Pt remains compliant with all LE self care protocols between visits. Pt continues to have difficulty getting custom compression garment applied correctly by SNF staff. She is able to correct positioning her self with extra time most days. Cont as per POC. Awaiting custom garment fitting for RLE.    Occupational performance deficits (Please refer to evaluation for details): ADL's;IADL's;Rest and Sleep;Work;Leisure;Social Participation    Rehab Potential Good    Clinical Decision Making Multiple treatment options, significant modification of  task necessary    Comorbidities Affecting Occupational Performance: Presence of comorbidities impacting  occupational performance    Modification or Assistance to Complete Evaluation  Min-Moderate modification of tasks or assist with assess necessary to complete eval    OT Treatment/Interventions Self-care/ADL training;Therapeutic exercise;Functional Mobility Training;Manual Therapy;Energy conservation;Manual lymph drainage;Therapeutic activities;Coping strategies training;DME and/or AE instruction;Compression bandaging;Patient/family education    Plan Complete Deconhgestive Therapy (CDT) Intensive and Self -Management phases, consisting of Manual lymphatic Drainage (MLD), skin care, ther ex and compression bandaging    OT Home Exercise Plan lymphatic pumping therex 2-3 x q day, in sequence, 10 reps bilaterally    Recommended Other Services Daytime: 2 Pr. [employer]st EVAREX custom, ccl 2, A-D, 2.5 cm SB at oblique top edge, open toe, t heel.  HOS: Jobst RELAX custom ccl 2 A-D, OT w. posterior zipper           Patient will benefit from skilled therapeutic intervention in order to improve the following deficits and impairments:           Visit Diagnosis: Lymphedema, not elsewhere classified    Problem List Patient Active Problem List   Diagnosis Date Noted  . Iron deficiency 08/06/2018  . History of total left knee replacement (TKR) 07/18/2018  . Rectal bleeding 07/05/2018  . Hemorrhoids 07/05/2018  . Anal itching 07/05/2018  . Rotator cuff arthropathy of right shoulder   . Shoulder arthritis 06/15/2017  . Status post revision of total knee replacement, left 04/29/2017  . Polyethylene liner wear following left total knee arthroplasty requiring isolated polyethylene liner exchange ([place]) 03/02/2017  . 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 Unstable angina ([place]) 05/21/2016  . Chronic diastolic heart failure ([place]) 03/29/2016  . Normal coronary arteries 2009 01/14/2015  . Obesity-BMI 40 01/14/2015  . Restless leg 01/14/2015  . Chest pain 01/14/2015  . Back pain  01/14/2015  . Renal insufficiency 01/14/2015  . Dementia- mild memory issues 01/14/2015  . Occult blood positive stool 12/14/2014  . Anemia 12/14/2014  . Family history of colon cancer 12/14/2014  . Change in bowel habits 12/14/2014  . GERD (gastroesophageal reflux disease) 12/14/2014  . Dyspnea 05/17/2012  . Lymphedema 05/17/2012  . Weight gain 05/17/2012  . HTN (hypertension) 05/17/2012    Andrey Spearman, MS, OTR/L, [facility] 06/21/20 12:14 PM  [place] MAIN [facility] SERVICES [address] [place], [place], [address] Phone: [phone]   Fax:  [phone]  Name: [name] MRN: [id number] Date of Birth: Jan 04, 1946

## 2020-06-24 ENCOUNTER — Ambulatory Visit: Payer: Medicare Other | Attending: Family Medicine | Admitting: Occupational Therapy

## 2020-06-24 ENCOUNTER — Other Ambulatory Visit: Payer: Self-pay

## 2020-06-24 DIAGNOSIS — I890 Lymphedema, not elsewhere classified: Secondary | ICD-10-CM

## 2020-06-24 NOTE — Therapy (Signed)
[place] MAIN [facility] SERVICES [address] [place], [place], [address] Phone: [phone]   Fax:  [phone]  Occupational Therapy Treatment  Patient Details  Name: [name] MRN: [id number] Date of Birth: March 05, 1946 Referring Provider (OT): Juluis Pitch, MD   Encounter Date: 06/24/2020   OT End of Session - 06/24/20 1313    Visit Number 99    Number of Visits 64    Date for OT Re-Evaluation 08/01/20    OT Start Time 1005    OT Stop Time 1105    OT Time Calculation (min) 60 min    Activity Tolerance Patient tolerated treatment well;Other (comment);No increased pain   NO SOB; no signs/ symptoms of infection   Behavior During Therapy WFL for tasks assessed/performed           Past Medical History:  Diagnosis Date  . Anemia   . Anxiety   . Arthritis    "hands, feet" (03/03/2017)  . Brain lesion    2 types  . Cataract   . Cervical spondylosis with myelopathy   . Chest pain    Normal cardiac cath 5/09  . Chronic lower back pain   . CKD (chronic kidney disease), stage III ([place])    [name]  . Congestive heart failure (CHF) ([place])    has a Cardiomems implant   . Constipation   . Dementia ([place])   . Depression   . Dumping syndrome   . Dyspnea    with activity  . Edema   . Fatigue   . Fibromyalgia   . GERD (gastroesophageal reflux disease)   . Headache    "w/high CBG" (03/03/2017)  . History of echocardiogram    a. Echo 03/20/16 (done at [facility] in [place], [place]):  mild LVH, EF 69%, normal diastolic function, mild LAE, MAC, RVSP 25 mmHg  . Hyperlipidemia   . Hypertension   . Hypothyroidism   . Lumbar spondylosis   . Lymphedema    seeing specialist for this  . Migraine    "mostly stopped when I changed my diet" (03/03/2017)  . OSA on CPAP   . Pneumonia X 1  . PONV (postoperative nausea and vomiting)   . Restless leg syndrome   . Rheumatoid arthritis ([place])   . Stroke [facility])    TIAs "mini  strokes"- unsure of last TIA - pt and daughter deny this  . Syncope   . Tremors of nervous system   . Type II diabetes mellitus ([place])     Past Surgical History:  Procedure Laterality Date  . APPENDECTOMY  1966  . BACK SURGERY    . CARDIAC CATHETERIZATION N/A 05/25/2016   Procedure: Right/Left Heart Cath and Coronary Angiography;  Surgeon: Larey Dresser, MD;  Location: [place] [facility];  Service: Cardiovascular;  Laterality: N/A;  . CATARACT EXTRACTION W/ INTRAOCULAR LENS  IMPLANT, BILATERAL Bilateral   . COLONOSCOPY    . CORONARY ANGIOGRAM  2009   Normal coronaries  . EXCISION/RELEASE BURSA HIP Bilateral   . I & D KNEE WITH POLY EXCHANGE Left 03/02/2017   Procedure: Left knee Revision of poly liner;  Surgeon: Mcarthur Rossetti, MD;  Location: [place];  Service: Orthopedics;  Laterality: Left;  . JOINT REPLACEMENT    . KNEE ARTHROSCOPY Bilateral   . LAPAROSCOPIC CHOLECYSTECTOMY  2015  . LMF  2017   CardiMEMS HF implant for CHF (measures amount of fluid in the heart)  . LUMBAR  [place] SURGERY    . LUMBAR LAMINECTOMY/DECOMPRESSION MICRODISCECTOMY Left 12/2005   L2-3 laminectomy and diskectomy/notes 01/06/2011  . POSTERIOR LUMBAR FUSION  04/2006   [name] 01/06/2011; "put cages in"  . REVERSE SHOULDER ARTHROPLASTY Left 06/15/2017   Procedure: REVERSE LEFT SHOULDER ARTHROPLASTY;  Surgeon: Meredith Pel, MD;  Location: [place];  Service: Orthopedics;  Laterality: Left;  . REVERSE SHOULDER ARTHROPLASTY Right 02/01/2018  . REVERSE SHOULDER ARTHROPLASTY Right 02/01/2018   Procedure: RIGHT REVERSE SHOULDER ARTHROPLASTY;  Surgeon: Meredith Pel, MD;  Location: [place];  Service: Orthopedics;  Laterality: Right;  . REVISION TOTAL KNEE ARTHROPLASTY Left 03/02/2017   poly liner/notes 03/02/2017  . RIGHT HEART CATH N/A 11/13/2016   Procedure: Right Heart Cath with Cardiomems;  Surgeon: Larey Dresser, MD;  Location: [place] [facility];  Service: Cardiovascular;  Laterality: N/A;  .  SHOULDER ARTHROSCOPY WITH ROTATOR CUFF REPAIR Right   . SHOULDER OPEN ROTATOR CUFF REPAIR Left 01/2011   [name] 01/29/2011  . TOTAL ABDOMINAL HYSTERECTOMY    . TOTAL KNEE ARTHROPLASTY Left   . TOTAL KNEE ARTHROPLASTY Right 11/18/2012   Procedure: TOTAL KNEE ARTHROPLASTY;  Surgeon: Yvette Rack., MD;  Location: [place];  Service: Orthopedics;  Laterality: Right;  . TUBAL LIGATION    . UPPER GASTROINTESTINAL ENDOSCOPY    . UPPER GI ENDOSCOPY      There were no vitals filed for this visit.   Subjective Assessment - 06/24/20 1311    Subjective  [name] presents to OT for Rx visit 56/72 to address BLE lipo-lymphedema (I89.0). Pt presents without compression wraps on RLE and WITH compression garment on the L. Pt reports OA pain is unchanged and L shoulder is painful this morning (not rated numerically).    Pertinent History chronic BLE leg swelling and associated pain ~ 40 years; hx herniated intervertebral discL TKA; L total knee revision; HTN, Obesity. dementia; restless leg; CHF; unstable anina; R RC arthroplasty, OA, cervical spondylosis with myelopathy, chronic lower back pain, CKD (Stage?); Fibromyalgia; Hypothyroidism; OSA ( cPap?); hx pneumonia; RA, Type II Diabetes. S/P cardiac cathg, B THA, Lumbar laminectomy, posterior lumbar fusion, B shoulder arthroplasty; total abdominal hysterectomy    Limitations chronic leg swelling and associated pain, difficulty walking, impaired dynamic balance, muscle weakness,    Repetition Increases Symptoms    Special Tests + STEMMER sign base of toes bilaterally                        OT Treatments/Exercises (OP) - 06/24/20 0001      ADLs   ADL Education Given Yes      Manual Therapy   Manual Therapy Edema management;Manual Lymphatic Drainage (MLD);Compression Bandaging    Manual Lymphatic Drainage (MLD) RLE MLD as established    Compression Bandaging RLE compression wraps from toes to popliteal only today                  OT  Education - 06/24/20 1311    Education Details Continued skilled Pt/caregiver education  And LE ADL training throughout visit for lymphedema self care/ home program, including compression wrapping, compression garment and device wear/care, lymphatic pumping ther ex, simple self-MLD, and skin care. Discussed progress towards goals.    Person(s) Educated Patient    Methods Explanation;Demonstration    Comprehension Verbalized understanding;Returned demonstration;Need further instruction               OT Long Term Goals - 06/07/20 1025  OT LONG TERM GOAL #1   Title Pt will be able to apply LLE multi-layer, short stretch compression wraps daily with maximum caregiver assistance, using correct gradient techniques independently to return affected limb/s, as closely as possible, to premorbid size and shape, to limit infection risk, and to improve safe functional mobility and ADLs performance.    Baseline dependent    Time 4    Period Days    Status Achieved   Pt has no assistance at SNF for compression wrapping. Pt launders and rolls bandages independently. Pt still requires ongoing verbal cues to lift leg and reposition wheil assistant waraps. 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     OT LONG TERM GOAL #2   Title Pt will be able to verbalize signs and symptoms of cellulitis infection and identify 4 common lymphedema precautions using printed resource for reference (modified independence) to limit LE progression over time.    Baseline Max A    Time 4    Period Days    Status Achieved      OT LONG TERM GOAL #3   Title Pt will sustain than 85% compliance with all daily LE self-care home program components throughout Intensive Phase CDT, including impeccable skin care, lymphatic pumping therex,  compression wraps and simple self MLD, to ensure optimal limb volume reduction, to limit infection risk and to limit LE progression.    Baseline dependent    Time 12    Period Weeks    Status  Partially Met   Pt 85% compliant. Staff at SNF not always available to support 85% compliance     OT LONG TERM GOAL #4   Title Pt to achieve at least 10% LLE limb volume reduction during Intensive Phase CDT with Max CG assistance for wrapping, to improve functional performance of basic and instrumental ADLs, and to limit LE progression.   to date LLE reductions: A-D= 15.7%, E-G= 29.63%, A-G= 22.9%. RLE: A-D: 6.7%, E-G= 15.7%, A-G= 11.2%   Baseline dependent    Time 12    Period Weeks    Status Partially Met   Met for all LLE segments and for full leg. Reduction goal for overall reduction of LLE limb volume to 20%. Now that RLE cellulitis is under better control we commenced CDT toLLE. By visual assessment would estimate ~  10% reduction thus far.     OT LONG TERM GOAL #5   Title During self-management phase of CDT Pt will retain limb volume reductions achieved during Intensive Phase CDT with no more than 3% volume increase using all LE self care components  to limit LE progression and further functional decline.    Baseline dependent    Time 6    Period Months    Status On-going                 Plan - 06/24/20 1314    Clinical Impression Statement Lymphedema well managed bilaterally today.  Pt tolerated MLD, skin care and compression wraps to RLE without increased pain. Reviewed again the plan to decrease  OT Rx frequency after RLE garment fitting is complete. Pt verbalized understanding and reiterates her wish to continue with follow along services to sustain clinical gains made thus far as she has limited support for LE self management in SNF . CNAs assist her with donning day time garments and using Flexitouch device by report,  but assistance varies in consistency. Pt continues to perform as a strong self-advocate for assitance with these  jome program components. Cont as per POC.    Occupational performance deficits (Please refer to evaluation for details): ADL's;IADL's;Rest and  Sleep;Work;Leisure;Social Participation    Rehab Potential Good    Clinical Decision Making Multiple treatment options, significant modification of task necessary    Comorbidities Affecting Occupational Performance: Presence of comorbidities impacting occupational performance    Modification or Assistance to Complete Evaluation  Min-Moderate modification of tasks or assist with assess necessary to complete eval    OT Treatment/Interventions Self-care/ADL training;Therapeutic exercise;Functional Mobility Training;Manual Therapy;Energy conservation;Manual lymph drainage;Therapeutic activities;Coping strategies training;DME and/or AE instruction;Compression bandaging;Patient/family education    Plan Complete Deconhgestive Therapy (CDT) Intensive and Self -Management phases, consisting of Manual lymphatic Drainage (MLD), skin care, ther ex and compression bandaging    OT Home Exercise Plan lymphatic pumping therex 2-3 x q day, in sequence, 10 reps bilaterally    Recommended Other Services Daytime: 2 Pr. [employer] EVAREX custom, ccl 2, A-D, 2.5 cm SB at oblique top edge, open toe, t heel.  HOS: [employer] RELAX custom ccl 2 A-D, OT w. posterior zipper           Patient will benefit from skilled therapeutic intervention in order to improve the following deficits and impairments:           Visit Diagnosis: Lymphedema, not elsewhere classified    Problem List Patient Active Problem List   Diagnosis Date Noted  . Iron deficiency 08/06/2018  . History of total left knee replacement (TKR) 07/18/2018  . Rectal bleeding 07/05/2018  . Hemorrhoids 07/05/2018  . Anal itching 07/05/2018  . Rotator cuff arthropathy of right shoulder   . Shoulder arthritis 06/15/2017  . Status post revision of total knee replacement, left 04/29/2017  . Polyethylene liner wear following left total knee arthroplasty requiring isolated polyethylene liner exchange ([place]) 03/02/2017  . 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 Unstable angina ([place]) 05/21/2016  . Chronic diastolic heart failure ([place]) 03/29/2016  . Normal coronary arteries 2009 01/14/2015  . Obesity-BMI 40 01/14/2015  . Restless leg 01/14/2015  . Chest pain 01/14/2015  . Back pain 01/14/2015  . Renal insufficiency 01/14/2015  . Dementia- mild memory issues 01/14/2015  . Occult blood positive stool 12/14/2014  . Anemia 12/14/2014  . Family history of colon cancer 12/14/2014  . Change in bowel habits 12/14/2014  . GERD (gastroesophageal reflux disease) 12/14/2014  . Dyspnea 05/17/2012  . Lymphedema 05/17/2012  . Weight gain 05/17/2012  . HTN (hypertension) 05/17/2012    Andrey Spearman, MS, OTR/L, [facility] 06/24/20 1:20 PM  [place] MAIN [facility] SERVICES [address] [place], [place], [address] Phone: [phone]   Fax:  [phone]  Name: [name] MRN: [id number] Date of Birth: 01/11/1946

## 2020-06-26 ENCOUNTER — Other Ambulatory Visit: Payer: Self-pay

## 2020-06-26 ENCOUNTER — Ambulatory Visit: Payer: Medicare Other | Admitting: Occupational Therapy

## 2020-06-26 DIAGNOSIS — I890 Lymphedema, not elsewhere classified: Secondary | ICD-10-CM

## 2020-06-26 NOTE — Therapy (Signed)
[place] MAIN [facility] SERVICES [address] [place], [place], [address] Phone: [phone]   Fax:  [phone]  Occupational Therapy Treatment  Patient Details  Name: [name] MRN: [id number] Date of Birth: Nov 09, 1945 Referring Provider (OT): Juluis Pitch, MD   Encounter Date: 06/26/2020   OT End of Session - 06/26/20 1238    Visit Number 25    Number of Visits 72    Date for OT Re-Evaluation 08/01/20    OT Start Time 1005    OT Stop Time 1105    OT Time Calculation (min) 60 min    Activity Tolerance Patient tolerated treatment well;Other (comment);No increased pain   NO SOB; no signs/ symptoms of infection   Behavior During Therapy WFL for tasks assessed/performed           Past Medical History:  Diagnosis Date  . Anemia   . Anxiety   . Arthritis    "hands, feet" (03/03/2017)  . Brain lesion    2 types  . Cataract   . Cervical spondylosis with myelopathy   . Chest pain    Normal cardiac cath 5/09  . Chronic lower back pain   . CKD (chronic kidney disease), stage III ([place])    [name]  . Congestive heart failure (CHF) ([place])    has a Cardiomems implant   . Constipation   . Dementia ([place])   . Depression   . Dumping syndrome   . Dyspnea    with activity  . Edema   . Fatigue   . Fibromyalgia   . GERD (gastroesophageal reflux disease)   . Headache    "w/high CBG" (03/03/2017)  . History of echocardiogram    a. Echo 03/20/16 (done at [facility] in [place], [place]):  mild LVH, EF 85%, normal diastolic function, mild LAE, MAC, RVSP 25 mmHg  . Hyperlipidemia   . Hypertension   . Hypothyroidism   . Lumbar spondylosis   . Lymphedema    seeing specialist for this  . Migraine    "mostly stopped when I changed my diet" (03/03/2017)  . OSA on CPAP   . Pneumonia X 1  . PONV (postoperative nausea and vomiting)   . Restless leg syndrome   . Rheumatoid arthritis ([place])   . Stroke [facility])    TIAs "mini  strokes"- unsure of last TIA - pt and daughter deny this  . Syncope   . Tremors of nervous system   . Type II diabetes mellitus ([place])     Past Surgical History:  Procedure Laterality Date  . APPENDECTOMY  1966  . BACK SURGERY    . CARDIAC CATHETERIZATION N/A 05/25/2016   Procedure: Right/Left Heart Cath and Coronary Angiography;  Surgeon: Larey Dresser, MD;  Location: [place] [facility];  Service: Cardiovascular;  Laterality: N/A;  . CATARACT EXTRACTION W/ INTRAOCULAR LENS  IMPLANT, BILATERAL Bilateral   . COLONOSCOPY    . CORONARY ANGIOGRAM  2009   Normal coronaries  . EXCISION/RELEASE BURSA HIP Bilateral   . I & D KNEE WITH POLY EXCHANGE Left 03/02/2017   Procedure: Left knee Revision of poly liner;  Surgeon: Mcarthur Rossetti, MD;  Location: [place];  Service: Orthopedics;  Laterality: Left;  . JOINT REPLACEMENT    . KNEE ARTHROSCOPY Bilateral   . LAPAROSCOPIC CHOLECYSTECTOMY  2015  . LMF  2017   CardiMEMS HF implant for CHF (measures amount of fluid in the heart)  . LUMBAR  [place] SURGERY    . LUMBAR LAMINECTOMY/DECOMPRESSION MICRODISCECTOMY Left 12/2005   L2-3 laminectomy and diskectomy/notes 01/06/2011  . POSTERIOR LUMBAR FUSION  04/2006   [name] 01/06/2011; "put cages in"  . REVERSE SHOULDER ARTHROPLASTY Left 06/15/2017   Procedure: REVERSE LEFT SHOULDER ARTHROPLASTY;  Surgeon: Meredith Pel, MD;  Location: [facility];  Service: Orthopedics;  Laterality: Left;  . REVERSE SHOULDER ARTHROPLASTY Right 02/01/2018  . REVERSE SHOULDER ARTHROPLASTY Right 02/01/2018   Procedure: RIGHT REVERSE SHOULDER ARTHROPLASTY;  Surgeon: Meredith Pel, MD;  Location: [place];  Service: Orthopedics;  Laterality: Right;  . REVISION TOTAL KNEE ARTHROPLASTY Left 03/02/2017   poly liner/notes 03/02/2017  . RIGHT HEART CATH N/A 11/13/2016   Procedure: Right Heart Cath with Cardiomems;  Surgeon: Larey Dresser, MD;  Location: [place] [facility];  Service: Cardiovascular;  Laterality: N/A;  .  SHOULDER ARTHROSCOPY WITH ROTATOR CUFF REPAIR Right   . SHOULDER OPEN ROTATOR CUFF REPAIR Left 01/2011   [name] 01/29/2011  . TOTAL ABDOMINAL HYSTERECTOMY    . TOTAL KNEE ARTHROPLASTY Left   . TOTAL KNEE ARTHROPLASTY Right 11/18/2012   Procedure: TOTAL KNEE ARTHROPLASTY;  Surgeon: Yvette Rack., MD;  Location: [place] [facility];  Service: Orthopedics;  Laterality: Right;  . TUBAL LIGATION    . UPPER GASTROINTESTINAL ENDOSCOPY    . UPPER GI ENDOSCOPY      There were no vitals filed for this visit.   Subjective Assessment - 06/26/20 1231    Subjective  Mrs Riesen presents to OT for Rx visit 58/72 to address BLE lipo-lymphedema (I89.0). Pt presents without compression wraps on RLE and WITH compression garment on the L. Pt denies LE related leg pain today. Pt reports she hyas met new SNF physician and they discussed ensuring orders are in place for donning garments daily and ensuring Flexitouch device is used as directed.    Pertinent History chronic BLE leg swelling and associated pain ~ 40 years; hx herniated intervertebral discL TKA; L total knee revision; HTN, Obesity. dementia; restless leg; CHF; unstable anina; R RC arthroplasty, OA, cervical spondylosis with myelopathy, chronic lower back pain, CKD (Stage?); Fibromyalgia; Hypothyroidism; OSA ( cPap?); hx pneumonia; RA, Type II Diabetes. S/P cardiac cathg, B THA, Lumbar laminectomy, posterior lumbar fusion, B shoulder arthroplasty; total abdominal hysterectomy    Limitations chronic leg swelling and associated pain, difficulty walking, impaired dynamic balance, muscle weakness,    Repetition Increases Symptoms    Special Tests + STEMMER sign base of toes bilaterally                        OT Treatments/Exercises (OP) - 06/26/20 0001      ADLs   ADL Education Given Yes      Manual Therapy   Manual Therapy Edema management;Manual Lymphatic Drainage (MLD);Compression Bandaging    Edema Management LLE skin care as establ;ished    Manual  Lymphatic Drainage (MLD) LLE MLD as established    Compression Bandaging RLE compression wraps from toes to popliteal only today                  OT Education - 06/26/20 1237    Education Details Continued skilled Pt/caregiver education  And LE ADL training throughout visit for lymphedema self care/ home program, including compression wrapping, compression garment and device wear/care, lymphatic pumping ther ex, simple self-MLD, and skin care. Discussed progress towards goals.    Person(s) Educated Patient    Methods Explanation;Demonstration  Comprehension Verbalized understanding;Returned demonstration;Need further instruction               OT Long Term Goals - 06/07/20 1025      OT LONG TERM GOAL #1   Title Pt will be able to apply LLE multi-layer, short stretch compression wraps daily with maximum caregiver assistance, using correct gradient techniques independently to return affected limb/s, as closely as possible, to premorbid size and shape, to limit infection risk, and to improve safe functional mobility and ADLs performance.    Baseline dependent    Time 4    Period Days    Status Achieved   Pt has no assistance at SNF for compression wrapping. Pt launders and rolls bandages independently. Pt still requires ongoing verbal cues to lift leg and reposition wheil assistant waraps. 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     OT LONG TERM GOAL #2   Title Pt will be able to verbalize signs and symptoms of cellulitis infection and identify 4 common lymphedema precautions using printed resource for reference (modified independence) to limit LE progression over time.    Baseline Max A    Time 4    Period Days    Status Achieved      OT LONG TERM GOAL #3   Title Pt will sustain than 85% compliance with all daily LE self-care home program components throughout Intensive Phase CDT, including impeccable skin care, lymphatic pumping therex,  compression wraps and simple self  MLD, to ensure optimal limb volume reduction, to limit infection risk and to limit LE progression.    Baseline dependent    Time 12    Period Weeks    Status Partially Met   Pt 85% compliant. Staff at SNF not always available to support 85% compliance     OT LONG TERM GOAL #4   Title Pt to achieve at least 10% LLE limb volume reduction during Intensive Phase CDT with Max CG assistance for wrapping, to improve functional performance of basic and instrumental ADLs, and to limit LE progression.   to date LLE reductions: A-D= 15.7%, E-G= 29.63%, A-G= 22.9%. RLE: A-D: 6.7%, E-G= 15.7%, A-G= 11.2%   Baseline dependent    Time 12    Period Weeks    Status Partially Met   Met for all LLE segments and for full leg. Reduction goal for overall reduction of LLE limb volume to 20%. Now that RLE cellulitis is under better control we commenced CDT toLLE. By visual assessment would estimate ~  10% reduction thus far.     OT LONG TERM GOAL #5   Title During self-management phase of CDT Pt will retain limb volume reductions achieved during Intensive Phase CDT with no more than 3% volume increase using all LE self care components  to limit LE progression and further functional decline.    Baseline dependent    Time 6    Period Months    Status On-going                 Plan - 06/26/20 1239    Clinical Impression Statement Returned to LLE for MLD today to check limb density, volume and pain level. Swelling is well controlled with Jobst ELVAREX knee high. Skin is in excellent condition. Fibrosis persists. Pain with palpation is gone. Wrapped RLE as established. Continue to answer questions and address Pt concers re upcoming transition to self-management CDT phase. Cont as per POC.    Occupational performance deficits (Please refer to  evaluation for details): ADL's;IADL's;Rest and Sleep;Work;Leisure;Social Participation    Rehab Potential Good    Clinical Decision Making Multiple treatment options,  significant modification of task necessary    Comorbidities Affecting Occupational Performance: Presence of comorbidities impacting occupational performance    Modification or Assistance to Complete Evaluation  Min-Moderate modification of tasks or assist with assess necessary to complete eval    OT Treatment/Interventions Self-care/ADL training;Therapeutic exercise;Functional Mobility Training;Manual Therapy;Energy conservation;Manual lymph drainage;Therapeutic activities;Coping strategies training;DME and/or AE instruction;Compression bandaging;Patient/family education    Plan Complete Deconhgestive Therapy (CDT) Intensive and Self -Management phases, consisting of Manual lymphatic Drainage (MLD), skin care, ther ex and compression bandaging    OT Home Exercise Plan lymphatic pumping therex 2-3 x q day, in sequence, 10 reps bilaterally    Recommended Other Services Daytime: 2 Pr. Jobst EVAREX custom, ccl 2, A-D, 2.5 cm SB at oblique top edge, open toe, t heel.  HOS: Jobst RELAX custom ccl 2 A-D, OT w. posterior zipper           Patient will benefit from skilled therapeutic intervention in order to improve the following deficits and impairments:           Visit Diagnosis: Lymphedema, not elsewhere classified    Problem List Patient Active Problem List   Diagnosis Date Noted  . Iron deficiency 08/06/2018  . History of total left knee replacement (TKR) 07/18/2018  . Rectal bleeding 07/05/2018  . Hemorrhoids 07/05/2018  . Anal itching 07/05/2018  . Rotator cuff arthropathy of right shoulder   . Shoulder arthritis 06/15/2017  . Status post revision of total knee replacement, left 04/29/2017  . Polyethylene liner wear following left total knee arthroplasty requiring isolated polyethylene liner exchange ([place]) 03/02/2017  . 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) 03/02/2017  . Unstable angina ([place]) 05/21/2016  . Chronic diastolic heart failure ([place]) 03/29/2016  . Normal  coronary arteries 2009 01/14/2015  . Obesity-BMI 40 01/14/2015  . Restless leg 01/14/2015  . Chest pain 01/14/2015  . Back pain 01/14/2015  . Renal insufficiency 01/14/2015  . Dementia- mild memory issues 01/14/2015  . Occult blood positive stool 12/14/2014  . Anemia 12/14/2014  . Family history of colon cancer 12/14/2014  . Change in bowel habits 12/14/2014  . GERD (gastroesophageal reflux disease) 12/14/2014  . Dyspnea 05/17/2012  . Lymphedema 05/17/2012  . Weight gain 05/17/2012  . HTN (hypertension) 05/17/2012    Andrey Spearman, MS, OTR/L, [facility] 06/26/20 12:43 PM  [place] MAIN [facility] SERVICES [address] [place], [place], [address] Phone: [phone]   Fax:  [phone]  Name: JAANAI SALEMI MRN: [id number] Date of Birth: [birth year]

## 2020-06-28 ENCOUNTER — Ambulatory Visit: Payer: Medicare Other | Admitting: Occupational Therapy

## 2020-06-28 ENCOUNTER — Other Ambulatory Visit: Payer: Self-pay

## 2020-06-28 DIAGNOSIS — I890 Lymphedema, not elsewhere classified: Secondary | ICD-10-CM | POA: Diagnosis not present

## 2020-06-28 NOTE — Therapy (Signed)
[place] MAIN [facility] SERVICES [address] [place], [place], [address] Phone: [phone]   Fax:  [phone]  Occupational Therapy Treatment  Patient Details  Name: [name] MRN: [id number] Date of Birth: [birth year] Referring Provider (OT): Juluis Pitch, MD   Encounter Date: 06/28/2020   OT End of Session - 06/28/20 1302    Visit Number 5    Number of Visits 72    Date for OT Re-Evaluation 08/01/20    Activity Tolerance Patient tolerated treatment well;Other (comment);No increased pain   NO SOB; no signs/ symptoms of infection   Behavior During Therapy WFL for tasks assessed/performed           Past Medical History:  Diagnosis Date  . Anemia   . Anxiety   . Arthritis    "hands, feet" (03/03/2017)  . Brain lesion    2 types  . Cataract   . Cervical spondylosis with myelopathy   . Chest pain    Normal cardiac cath 5/09  . Chronic lower back pain   . CKD (chronic kidney disease), stage III ([place])    [name]  . Congestive heart failure (CHF) ([place])    has a Cardiomems implant   . Constipation   . Dementia ([place])   . Depression   . Dumping syndrome   . Dyspnea    with activity  . Edema   . Fatigue   . Fibromyalgia   . GERD (gastroesophageal reflux disease)   . Headache    "w/high CBG" (03/03/2017)  . History of echocardiogram    a. Echo 03/20/16 (done at [facility] in [place], [place]):  mild LVH, EF 85%, normal diastolic function, mild LAE, MAC, RVSP 25 mmHg  . Hyperlipidemia   . Hypertension   . Hypothyroidism   . Lumbar spondylosis   . Lymphedema    seeing specialist for this  . Migraine    "mostly stopped when I changed my diet" (03/03/2017)  . OSA on CPAP   . Pneumonia X 1  . PONV (postoperative nausea and vomiting)   . Restless leg syndrome   . Rheumatoid arthritis ([place])   . Stroke [facility])    TIAs "mini strokes"- unsure of last TIA - pt and daughter deny this  . Syncope   . Tremors of  nervous system   . Type II diabetes mellitus ([place])     Past Surgical History:  Procedure Laterality Date  . APPENDECTOMY  1966  . BACK SURGERY    . CARDIAC CATHETERIZATION N/A 05/25/2016   Procedure: Right/Left Heart Cath and Coronary Angiography;  Surgeon: Larey Dresser, MD;  Location: [place] CV LAB;  Service: Cardiovascular;  Laterality: N/A;  . CATARACT EXTRACTION W/ INTRAOCULAR LENS  IMPLANT, BILATERAL Bilateral   . COLONOSCOPY    . CORONARY ANGIOGRAM  2009   Normal coronaries  . EXCISION/RELEASE BURSA HIP Bilateral   . I & D KNEE WITH POLY EXCHANGE Left 03/02/2017   Procedure: Left knee Revision of poly liner;  Surgeon: Mcarthur Rossetti, MD;  Location: [place];  Service: Orthopedics;  Laterality: Left;  . JOINT REPLACEMENT    . KNEE ARTHROSCOPY Bilateral   . LAPAROSCOPIC CHOLECYSTECTOMY  2015  . LMF  2017   CardiMEMS HF implant for CHF (measures amount of fluid in the heart)  . LUMBAR DISC SURGERY    . LUMBAR LAMINECTOMY/DECOMPRESSION MICRODISCECTOMY Left 12/2005   L2-3 laminectomy and diskectomy/notes 01/06/2011  . POSTERIOR LUMBAR FUSION  04/2006   [name] 01/06/2011; "put cages in"  . REVERSE SHOULDER ARTHROPLASTY Left 06/15/2017   Procedure: REVERSE LEFT SHOULDER ARTHROPLASTY;  Surgeon: Meredith Pel, MD;  Location: [place];  Service: Orthopedics;  Laterality: Left;  . REVERSE SHOULDER ARTHROPLASTY Right 02/01/2018  . REVERSE SHOULDER ARTHROPLASTY Right 02/01/2018   Procedure: RIGHT REVERSE SHOULDER ARTHROPLASTY;  Surgeon: Meredith Pel, MD;  Location: [place];  Service: Orthopedics;  Laterality: Right;  . REVISION TOTAL KNEE ARTHROPLASTY Left 03/02/2017   poly liner/notes 03/02/2017  . RIGHT HEART CATH N/A 11/13/2016   Procedure: Right Heart Cath with Cardiomems;  Surgeon: Larey Dresser, MD;  Location: [place] CV LAB;  Service: Cardiovascular;  Laterality: N/A;  . SHOULDER ARTHROSCOPY WITH ROTATOR CUFF REPAIR Right   . SHOULDER OPEN ROTATOR CUFF REPAIR  Left 01/2011   [name] 01/29/2011  . TOTAL ABDOMINAL HYSTERECTOMY    . TOTAL KNEE ARTHROPLASTY Left   . TOTAL KNEE ARTHROPLASTY Right 11/18/2012   Procedure: TOTAL KNEE ARTHROPLASTY;  Surgeon: Yvette Rack., MD;  Location: [place];  Service: Orthopedics;  Laterality: Right;  . TUBAL LIGATION    . UPPER GASTROINTESTINAL ENDOSCOPY    . UPPER GI ENDOSCOPY      There were no vitals filed for this visit.   Subjective Assessment - 06/28/20 1259    Subjective  [name] presents to OT for Rx visit 59/72 to address BLE lipo-lymphedema (I89.0). Pt presents without compression wraps on RLE and WITH compression garment on the L. Pt denies LE related leg pain today.    Pertinent History chronic BLE leg swelling and associated pain ~ 40 years; hx herniated intervertebral discL TKA; L total knee revision; HTN, Obesity. dementia; restless leg; CHF; unstable anina; R RC arthroplasty, OA, cervical spondylosis with myelopathy, chronic lower back pain, CKD (Stage?); Fibromyalgia; Hypothyroidism; OSA ( cPap?); hx pneumonia; RA, Type II Diabetes. S/P cardiac cathg, B THA, Lumbar laminectomy, posterior lumbar fusion, B shoulder arthroplasty; total abdominal hysterectomy    Limitations chronic leg swelling and associated pain, difficulty walking, impaired dynamic balance, muscle weakness,    Repetition Increases Symptoms    Special Tests + STEMMER sign base of toes bilaterally                        OT Treatments/Exercises (OP) - 06/28/20 0001      ADLs   ADL Education Given Yes      Manual Therapy   Manual Therapy Edema management;Compression Bandaging    Compression Bandaging LLE compression wraps from toes to popliteal only today                  OT Education - 06/28/20 1302    Education Details Continued skilled Pt/caregiver education  And LE ADL training throughout visit for lymphedema self care/ home program, including compression wrapping, compression garment and device wear/care,  lymphatic pumping ther ex, simple self-MLD, and skin care. Discussed progress towards goals.    Person(s) Educated Patient    Methods Explanation;Demonstration    Comprehension Verbalized understanding;Returned demonstration;Need further instruction               OT Long Term Goals - 06/07/20 1025      OT LONG TERM GOAL #1   Title Pt will be able to apply LLE multi-layer, short stretch compression wraps daily with maximum caregiver assistance, using correct gradient techniques independently to return affected limb/s, as closely as possible, to premorbid size and shape, to  limit infection risk, and to improve safe functional mobility and ADLs performance.    Baseline dependent    Time 4    Period Days    Status Achieved   Pt has no assistance at SNF for compression wrapping. Pt launders and rolls bandages independently. Pt still requires ongoing verbal cues to lift leg and reposition wheil assistant waraps. 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     OT LONG TERM GOAL #2   Title Pt will be able to verbalize signs and symptoms of cellulitis infection and identify 4 common lymphedema precautions using printed resource for reference (modified independence) to limit LE progression over time.    Baseline Max A    Time 4    Period Days    Status Achieved      OT LONG TERM GOAL #3   Title Pt will sustain than 85% compliance with all daily LE self-care home program components throughout Intensive Phase CDT, including impeccable skin care, lymphatic pumping therex,  compression wraps and simple self MLD, to ensure optimal limb volume reduction, to limit infection risk and to limit LE progression.    Baseline dependent    Time 12    Period Weeks    Status Partially Met   Pt 85% compliant. Staff at SNF not always available to support 85% compliance     OT LONG TERM GOAL #4   Title Pt to achieve at least 10% LLE limb volume reduction during Intensive Phase CDT with Max CG assistance for  wrapping, to improve functional performance of basic and instrumental ADLs, and to limit LE progression.   to date LLE reductions: A-D= 15.7%, E-G= 29.63%, A-G= 22.9%. RLE: A-D: 6.7%, E-G= 15.7%, A-G= 11.2%   Baseline dependent    Time 12    Period Weeks    Status Partially Met   Met for all LLE segments and for full leg. Reduction goal for overall reduction of LLE limb volume to 20%. Now that RLE cellulitis is under better control we commenced CDT toLLE. By visual assessment would estimate ~  10% reduction thus far.     OT LONG TERM GOAL #5   Title During self-management phase of CDT Pt will retain limb volume reductions achieved during Intensive Phase CDT with no more than 3% volume increase using all LE self care components  to limit LE progression and further functional decline.    Baseline dependent    Time 6    Period Months    Status On-going                 Plan - 06/28/20 1303    Clinical Impression Statement Emphasis of visit on compression wraps to RLE due to time constraints this morning. Pt continues to tolerate all aspects of CDT and she demonstrates excellent progress towards all OT gols. Cont as per POC. Volumetrics bilaterally next session.,    Occupational performance deficits (Please refer to evaluation for details): ADL's;IADL's;Rest and Sleep;Work;Leisure;Social Participation    Rehab Potential Good    Clinical Decision Making Multiple treatment options, significant modification of task necessary    Comorbidities Affecting Occupational Performance: Presence of comorbidities impacting occupational performance    Modification or Assistance to Complete Evaluation  Min-Moderate modification of tasks or assist with assess necessary to complete eval    OT Treatment/Interventions Self-care/ADL training;Therapeutic exercise;Functional Mobility Training;Manual Therapy;Energy conservation;Manual lymph drainage;Therapeutic activities;Coping strategies training;DME and/or AE  instruction;Compression bandaging;Patient/family education    Plan Complete Deconhgestive Therapy (CDT) Intensive and  Self -Management phases, consisting of Manual lymphatic Drainage (MLD), skin care, ther ex and compression bandaging    OT Home Exercise Plan lymphatic pumping therex 2-3 x q day, in sequence, 10 reps bilaterally    Recommended Other Services Daytime: 2 Pr. Jobst EVAREX custom, ccl 2, A-D, 2.5 cm SB at oblique top edge, open toe, t heel.  HOS: Jobst RELAX custom ccl 2 A-D, OT w. posterior zipper           Patient will benefit from skilled therapeutic intervention in order to improve the following deficits and impairments:           Visit Diagnosis: Lymphedema, not elsewhere classified    Problem List Patient Active Problem List   Diagnosis Date Noted  . Iron deficiency 08/06/2018  . History of total left knee replacement (TKR) 07/18/2018  . Rectal bleeding 07/05/2018  . Hemorrhoids 07/05/2018  . Anal itching 07/05/2018  . Rotator cuff arthropathy of right shoulder   . Shoulder arthritis 06/15/2017  . Status post revision of total knee replacement, left 04/29/2017  . Polyethylene liner wear following left total knee arthroplasty requiring isolated polyethylene liner exchange ([place]) 03/02/2017  . 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 Unstable angina ([place]) 05/21/2016  . Chronic diastolic heart failure ([place]) 03/29/2016  . Normal coronary arteries 2009 01/14/2015  . Obesity-BMI 40 01/14/2015  . Restless leg 01/14/2015  . Chest pain 01/14/2015  . Back pain 01/14/2015  . Renal insufficiency 01/14/2015  . Dementia- mild memory issues 01/14/2015  . Occult blood positive stool 12/14/2014  . Anemia 12/14/2014  . Family history of colon cancer 12/14/2014  . Change in bowel habits 12/14/2014  . GERD (gastroesophageal reflux disease) 12/14/2014  . Dyspnea 05/17/2012  . Lymphedema 05/17/2012  . Weight gain 05/17/2012  . HTN  (hypertension) 05/17/2012    Andrey Spearman, MS, OTR/L, [facility] 06/28/20 1:04 PM   [place] MAIN [facility] SERVICES [address] [place], [place], [address] Phone: [phone]   Fax:  [phone]  Name: [name] MRN: [id number] Date of Birth: [birth year]

## 2020-07-01 ENCOUNTER — Ambulatory Visit: Payer: Medicare Other | Admitting: Occupational Therapy

## 2020-07-01 ENCOUNTER — Other Ambulatory Visit: Payer: Self-pay

## 2020-07-01 DIAGNOSIS — I890 Lymphedema, not elsewhere classified: Secondary | ICD-10-CM | POA: Diagnosis not present

## 2020-07-01 NOTE — Therapy (Signed)
[place] MAIN [facility] SERVICES [address] [place], [place], [address] Phone: [phone]   Fax:  [phone]  Occupational Therapy Treatment Note and Progress Report: Lymphedema Care  Patient Details  Name: [name] MRN: [id number] Date of Birth: August 01, 1946 Referring Provider (OT): [name] Pitch, MD   Encounter Date: 07/01/2020   OT End of Session - 07/01/20 1221    Visit Number 60    Number of Visits 72    Date for OT Re-Evaluation 08/01/20    OT Start Time 1000    OT Stop Time 1105    OT Time Calculation (min) 65 min    Activity Tolerance Patient tolerated treatment well;Other (comment);No increased pain   NO SOB; no signs/ symptoms of infection   Behavior During Therapy WFL for tasks assessed/performed           Past Medical History:  Diagnosis Date  . Anemia   . Anxiety   . Arthritis    "hands, feet" (03/03/2017)  . Brain lesion    2 types  . Cataract   . Cervical spondylosis with myelopathy   . Chest pain    Normal cardiac cath 5/09  . Chronic lower back pain   . CKD (chronic kidney disease), stage III ([place])    [name]  . Congestive heart failure (CHF) ([place])    has a Cardiomems implant   . Constipation   . Dementia ([place])   . Depression   . Dumping syndrome   . Dyspnea    with activity  . Edema   . Fatigue   . Fibromyalgia   . GERD (gastroesophageal reflux disease)   . Headache    "w/high CBG" (03/03/2017)  . History of echocardiogram    a. Echo 03/20/16 (done at [facility] in [place], [place]):  mild LVH, EF 33%, normal diastolic function, mild LAE, MAC, RVSP 25 mmHg  . Hyperlipidemia   . Hypertension   . Hypothyroidism   . Lumbar spondylosis   . Lymphedema    seeing specialist for this  . Migraine    "mostly stopped when I changed my diet" (03/03/2017)  . OSA on CPAP   . Pneumonia X 1  . PONV (postoperative nausea and vomiting)   . Restless leg syndrome   . Rheumatoid  arthritis ([place])   . Stroke [facility])    TIAs "mini strokes"- unsure of last TIA - pt and daughter deny this  . Syncope   . Tremors of nervous system   . Type II diabetes mellitus ([place])     Past Surgical History:  Procedure Laterality Date  . APPENDECTOMY  1966  . BACK SURGERY    . CARDIAC CATHETERIZATION N/A 05/25/2016   Procedure: Right/Left Heart Cath and Coronary Angiography;  Surgeon: Larey Dresser, MD;  Location: [place] CV LAB;  Service: Cardiovascular;  Laterality: N/A;  . CATARACT EXTRACTION W/ INTRAOCULAR LENS  IMPLANT, BILATERAL Bilateral   . COLONOSCOPY    . CORONARY ANGIOGRAM  2009   Normal coronaries  . EXCISION/RELEASE BURSA HIP Bilateral   . I & D KNEE WITH POLY EXCHANGE Left 03/02/2017   Procedure: Left knee Revision of poly liner;  Surgeon: Mcarthur Rossetti, MD;  Location: [place];  Service: Orthopedics;  Laterality: Left;  . JOINT REPLACEMENT    . KNEE ARTHROSCOPY Bilateral   . LAPAROSCOPIC CHOLECYSTECTOMY  2015  . LMF  2017   CardiMEMS HF implant for CHF (measures amount of fluid  in the heart)  . LUMBAR DISC SURGERY    . LUMBAR LAMINECTOMY/DECOMPRESSION MICRODISCECTOMY Left 12/2005   L2-3 laminectomy and diskectomy/notes 01/06/2011  . POSTERIOR LUMBAR FUSION  04/2006   [name] 01/06/2011; "put cages in"  . REVERSE SHOULDER ARTHROPLASTY Left 06/15/2017   Procedure: REVERSE LEFT SHOULDER ARTHROPLASTY;  Surgeon: Meredith Pel, MD;  Location: [place];  Service: Orthopedics;  Laterality: Left;  . REVERSE SHOULDER ARTHROPLASTY Right 02/01/2018  . REVERSE SHOULDER ARTHROPLASTY Right 02/01/2018   Procedure: RIGHT REVERSE SHOULDER ARTHROPLASTY;  Surgeon: Meredith Pel, MD;  Location: [place];  Service: Orthopedics;  Laterality: Right;  . REVISION TOTAL KNEE ARTHROPLASTY Left 03/02/2017   poly liner/notes 03/02/2017  . RIGHT HEART CATH N/A 11/13/2016   Procedure: Right Heart Cath with Cardiomems;  Surgeon: Larey Dresser, MD;  Location: [place] [facility];   Service: Cardiovascular;  Laterality: N/A;  . SHOULDER ARTHROSCOPY WITH ROTATOR CUFF REPAIR Right   . SHOULDER OPEN ROTATOR CUFF REPAIR Left 01/2011   [name] 01/29/2011  . TOTAL ABDOMINAL HYSTERECTOMY    . TOTAL KNEE ARTHROPLASTY Left   . TOTAL KNEE ARTHROPLASTY Right 11/18/2012   Procedure: TOTAL KNEE ARTHROPLASTY;  Surgeon: Yvette Rack., MD;  Location: [place];  Service: Orthopedics;  Laterality: Right;  . TUBAL LIGATION    . UPPER GASTROINTESTINAL ENDOSCOPY    . UPPER GI ENDOSCOPY      There were no vitals filed for this visit.   Subjective Assessment - 07/01/20 1221    Subjective  [name] presents to OT for Rx visit 60/72 to address BLE lipo-lymphedema (I89.0). Pt presents without compression wraps on RLE and WITH compression garment on the L. Pt denies LE related leg pain today.    Pertinent History chronic BLE leg swelling and associated pain ~ 40 years; hx herniated intervertebral discL TKA; L total knee revision; HTN, Obesity. dementia; restless leg; CHF; unstable anina; R RC arthroplasty, OA, cervical spondylosis with myelopathy, chronic lower back pain, CKD (Stage?); Fibromyalgia; Hypothyroidism; OSA ( cPap?); hx pneumonia; RA, Type II Diabetes. S/P cardiac cathg, B THA, Lumbar laminectomy, posterior lumbar fusion, B shoulder arthroplasty; total abdominal hysterectomy    Limitations chronic leg swelling and associated pain, difficulty walking, impaired dynamic balance, muscle weakness,    Repetition Increases Symptoms    Special Tests + STEMMER sign base of toes bilaterally                        OT Treatments/Exercises (OP) - 07/01/20 0001      ADLs   ADL Education Given Yes      Manual Therapy   Manual Therapy Edema management;Manual Lymphatic Drainage (MLD);Compression Bandaging    Manual therapy comments skin care to LLE as established during MLD to improve hydration and limit infection risk    Manual Lymphatic Drainage (MLD) LLE MLD as established     Compression Bandaging LLE compression wraps from toes to popliteal only today                  OT Education - 07/01/20 1227    Education Details Continued skilled Pt/caregiver education  And LE ADL training throughout visit for lymphedema self care/ home program, including compression wrapping, compression garment and device wear/care, lymphatic pumping ther ex, simple self-MLD, and skin care. Discussed progress towards goals.    Person(s) Educated Patient    Methods Explanation;Demonstration    Comprehension Verbalized understanding;Returned demonstration;Need further instruction  OT Long Term Goals - 07/01/20 1222      OT LONG TERM GOAL #1   Title Pt will be able to apply LLE multi-layer, short stretch compression wraps daily with maximum caregiver assistance, using correct gradient techniques independently to return affected limb/s, as closely as possible, to premorbid size and shape, to limit infection risk, and to improve safe functional mobility and ADLs performance.    Baseline dependent    Time 4    Period Days    Status Achieved   Pt has no assistance at SNF for compression wrapping. Pt launders and rolls bandages independently. Pt still requires ongoing verbal cues to lift leg and reposition wheil assistant waraps. 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     OT LONG TERM GOAL #2   Title Pt will be able to verbalize signs and symptoms of cellulitis infection and identify 4 common lymphedema precautions using printed resource for reference (modified independence) to limit LE progression over time.    Baseline Max A    Time 4    Period Days    Status Achieved      OT LONG TERM GOAL #3   Title Pt will sustain than 85% compliance with all daily LE self-care home program components throughout Intensive Phase CDT, including impeccable skin care, lymphatic pumping therex,  compression wraps and simple self MLD, to ensure optimal limb volume reduction, to  limit infection risk and to limit LE progression.    Baseline dependent    Time 12    Period Weeks    Status Achieved   Pt 85% compliant. Staff at SNF not always available to support 85% compliance     OT LONG TERM GOAL #4   Title Pt to achieve at least 10% LLE limb volume reduction during Intensive Phase CDT with Max CG assistance for wrapping, to improve functional performance of basic and instrumental ADLs, and to limit LE progression.   to date LLE reductions: A-D= 15.7%, E-G= 29.63%, A-G= 22.9%. RLE: A-D: 6.7%, E-G= 15.7%, A-G= 11.2%   Baseline dependent    Time 12    Period Weeks    Status Achieved      OT LONG TERM GOAL #5   Title During self-management phase of CDT Pt will retain limb volume reductions achieved during Intensive Phase CDT with no more than 3% volume increase using all LE self care components  to limit LE progression and further functional decline.    Baseline dependent    Time 6    Period Months    Status On-going                 Plan - 07/01/20 1227    Clinical Impression Statement Pt demonstrates excellent progress towards all OT goals for BLE LE care. Limb volume is decreased within goal range bilateralyand skin condition is much improved. Signs/ symptoms of infection have resolved. Pt is enthusiastic about performing all home program compponents daily, but requires assistance to don and doff custom garments and to access advanced Flexitouch device daily as perscribed. Assistance at the SNF has been inconsistent to date and staff members need additional training for correct garment fitting. RLE custom stocking is ordered and will be fit when avaialable from DME vendor. Then we'll assist Pt with transition to Management Phase of CDT and reduce Rx frequency while continuing to support her as needed. Cont as per POC.    Occupational performance deficits (Please refer to evaluation for details): ADL's;IADL's;Rest and Sleep;Work;Leisure;Social  Participation     Rehab Potential Good    Clinical Decision Making Multiple treatment options, significant modification of task necessary    Comorbidities Affecting Occupational Performance: Presence of comorbidities impacting occupational performance    Modification or Assistance to Complete Evaluation  Min-Moderate modification of tasks or assist with assess necessary to complete eval    OT Treatment/Interventions Self-care/ADL training;Therapeutic exercise;Functional Mobility Training;Manual Therapy;Energy conservation;Manual lymph drainage;Therapeutic activities;Coping strategies training;DME and/or AE instruction;Compression bandaging;Patient/family education    Plan Complete Deconhgestive Therapy (CDT) Intensive and Self -Management phases, consisting of Manual lymphatic Drainage (MLD), skin care, ther ex and compression bandaging    OT Home Exercise Plan lymphatic pumping therex 2-3 x q day, in sequence, 10 reps bilaterally    Recommended Other Services Daytime: 2 Pr. [employer]st EVAREX custom, ccl 2, A-D, 2.5 cm SB at oblique top edge, open toe, t heel.  HOS: Jobst RELAX custom ccl 2 A-D, OT w. posterior zipper           Patient will benefit from skilled therapeutic intervention in order to improve the following deficits and impairments:           Visit Diagnosis: Lymphedema, not elsewhere classified    Problem List Patient Active Problem List   Diagnosis Date Noted  . Iron deficiency 08/06/2018  . History of total left knee replacement (TKR) 07/18/2018  . Rectal bleeding 07/05/2018  . Hemorrhoids 07/05/2018  . Anal itching 07/05/2018  . Rotator cuff arthropathy of right shoulder   . Shoulder arthritis 06/15/2017  . Status post revision of total knee replacement, left 04/29/2017  . Polyethylene liner wear following left total knee arthroplasty requiring isolated polyethylene liner exchange ([place]) 03/02/2017  . 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 Unstable angina  ([place]) 05/21/2016  . Chronic diastolic heart failure ([place]) 03/29/2016  . Normal coronary arteries 2009 01/14/2015  . Obesity-BMI 40 01/14/2015  . Restless leg 01/14/2015  . Chest pain 01/14/2015  . Back pain 01/14/2015  . Renal insufficiency 01/14/2015  . Dementia- mild memory issues 01/14/2015  . Occult blood positive stool 12/14/2014  . Anemia 12/14/2014  . Family history of colon cancer 12/14/2014  . Change in bowel habits 12/14/2014  . GERD (gastroesophageal reflux disease) 12/14/2014  . Dyspnea 05/17/2012  . Lymphedema 05/17/2012  . Weight gain 05/17/2012  . HTN (hypertension) 05/17/2012    Andrey Spearman, MS, OTR/L, [facility] 07/01/20 12:34 PM   [place] MAIN [facility] SERVICES [address] [place], [place], [address] Phone: [phone]   Fax:  [phone]  Name: [name] MRN: [id number] Date of Birth: 02-26-1946

## 2020-07-03 ENCOUNTER — Ambulatory Visit: Payer: Medicare Other | Admitting: Occupational Therapy

## 2020-07-03 ENCOUNTER — Other Ambulatory Visit: Payer: Self-pay

## 2020-07-03 DIAGNOSIS — I890 Lymphedema, not elsewhere classified: Secondary | ICD-10-CM

## 2020-07-03 NOTE — Therapy (Signed)
[place] MAIN [facility] SERVICES [address] [place], [place], [address] Phone: [phone]   Fax:  [phone]  Occupational Therapy Treatment  Patient Details  Name: [name] MRN: [id number] Date of Birth: 04-10-1946 Referring Provider (OT): Juluis Pitch, MD   Encounter Date: 07/03/2020   OT End of Session - 07/03/20 1114    Visit Number 61    Number of Visits 46    Date for OT Re-Evaluation 08/01/20    OT Start Time 1006    OT Stop Time 1106    OT Time Calculation (min) 60 min    Activity Tolerance Patient tolerated treatment well;Other (comment);No increased pain   NO SOB; no signs/ symptoms of infection   Behavior During Therapy WFL for tasks assessed/performed           Past Medical History:  Diagnosis Date  . Anemia   . Anxiety   . Arthritis    "hands, feet" (03/03/2017)  . Brain lesion    2 types  . Cataract   . Cervical spondylosis with myelopathy   . Chest pain    Normal cardiac cath 5/09  . Chronic lower back pain   . CKD (chronic kidney disease), stage III ([place])    [name]  . Congestive heart failure (CHF) ([place])    has a Cardiomems implant   . Constipation   . Dementia ([place])   . Depression   . Dumping syndrome   . Dyspnea    with activity  . Edema   . Fatigue   . Fibromyalgia   . GERD (gastroesophageal reflux disease)   . Headache    "w/high CBG" (03/03/2017)  . History of echocardiogram    a. Echo 03/20/16 (done at [facility] in [place], [place]):  mild LVH, EF 10%, normal diastolic function, mild LAE, MAC, RVSP 25 mmHg  . Hyperlipidemia   . Hypertension   . Hypothyroidism   . Lumbar spondylosis   . Lymphedema    seeing specialist for this  . Migraine    "mostly stopped when I changed my diet" (03/03/2017)  . OSA on CPAP   . Pneumonia X 1  . PONV (postoperative nausea and vomiting)   . Restless leg syndrome   . Rheumatoid arthritis ([place])   . Stroke [facility])    TIAs "mini  strokes"- unsure of last TIA - pt and daughter deny this  . Syncope   . Tremors of nervous system   . Type II diabetes mellitus ([place])     Past Surgical History:  Procedure Laterality Date  . APPENDECTOMY  1966  . BACK SURGERY    . CARDIAC CATHETERIZATION N/A 05/25/2016   Procedure: Right/Left Heart Cath and Coronary Angiography;  Surgeon: Larey Dresser, MD;  Location: [place] [facility];  Service: Cardiovascular;  Laterality: N/A;  . CATARACT EXTRACTION W/ INTRAOCULAR LENS  IMPLANT, BILATERAL Bilateral   . COLONOSCOPY    . CORONARY ANGIOGRAM  2009   Normal coronaries  . EXCISION/RELEASE BURSA HIP Bilateral   . I & D KNEE WITH POLY EXCHANGE Left 03/02/2017   Procedure: Left knee Revision of poly liner;  Surgeon: Mcarthur Rossetti, MD;  Location: [place];  Service: Orthopedics;  Laterality: Left;  . JOINT REPLACEMENT    . KNEE ARTHROSCOPY Bilateral   . LAPAROSCOPIC CHOLECYSTECTOMY  2015  . LMF  2017   CardiMEMS HF implant for CHF (measures amount of fluid in the heart)  . LUMBAR  [place] SURGERY    . LUMBAR LAMINECTOMY/DECOMPRESSION MICRODISCECTOMY Left 12/2005   L2-3 laminectomy and diskectomy/notes 01/06/2011  . POSTERIOR LUMBAR FUSION  04/2006   [name] 01/06/2011; "put cages in"  . REVERSE SHOULDER ARTHROPLASTY Left 06/15/2017   Procedure: REVERSE LEFT SHOULDER ARTHROPLASTY;  Surgeon: Meredith Pel, MD;  Location: [place];  Service: Orthopedics;  Laterality: Left;  . REVERSE SHOULDER ARTHROPLASTY Right 02/01/2018  . REVERSE SHOULDER ARTHROPLASTY Right 02/01/2018   Procedure: RIGHT REVERSE SHOULDER ARTHROPLASTY;  Surgeon: Meredith Pel, MD;  Location: [place];  Service: Orthopedics;  Laterality: Right;  . REVISION TOTAL KNEE ARTHROPLASTY Left 03/02/2017   poly liner/notes 03/02/2017  . RIGHT HEART CATH N/A 11/13/2016   Procedure: Right Heart Cath with Cardiomems;  Surgeon: Larey Dresser, MD;  Location: [place] [facility];  Service: Cardiovascular;  Laterality: N/A;  .  SHOULDER ARTHROSCOPY WITH ROTATOR CUFF REPAIR Right   . SHOULDER OPEN ROTATOR CUFF REPAIR Left 01/2011   [name] 01/29/2011  . TOTAL ABDOMINAL HYSTERECTOMY    . TOTAL KNEE ARTHROPLASTY Left   . TOTAL KNEE ARTHROPLASTY Right 11/18/2012   Procedure: TOTAL KNEE ARTHROPLASTY;  Surgeon: Yvette Rack., MD;  Location: [place];  Service: Orthopedics;  Laterality: Right;  . TUBAL LIGATION    . UPPER GASTROINTESTINAL ENDOSCOPY    . UPPER GI ENDOSCOPY      There were no vitals filed for this visit.   Subjective Assessment - 07/03/20 1021    Subjective  [name] presents to OT for Rx visit 61/72 to address BLE lipo-lymphedema (I89.0). Pt presents without compression wraps on RLE and WITH compression garment on the L. Pt denies LE related leg pain today.    Pertinent History chronic BLE leg swelling and associated pain ~ 40 years; hx herniated intervertebral discL TKA; L total knee revision; HTN, Obesity. dementia; restless leg; CHF; unstable anina; R RC arthroplasty, OA, cervical spondylosis with myelopathy, chronic lower back pain, CKD (Stage?); Fibromyalgia; Hypothyroidism; OSA ( cPap?); hx pneumonia; RA, Type II Diabetes. S/P cardiac cathg, B THA, Lumbar laminectomy, posterior lumbar fusion, B shoulder arthroplasty; total abdominal hysterectomy    Limitations chronic leg swelling and associated pain, difficulty walking, impaired dynamic balance, muscle weakness,    Repetition Increases Symptoms    Special Tests + STEMMER sign base of toes bilaterally                        OT Treatments/Exercises (OP) - 07/03/20 0001      ADLs   ADL Education Given Yes      Manual Therapy   Manual Therapy Edema management;Manual Lymphatic Drainage (MLD);Compression Bandaging    Manual therapy comments skin care to LLE as established during MLD to improve hydration and limit infection risk    Manual Lymphatic Drainage (MLD) LLE MLD as established    Compression Bandaging LLE compression wraps from  toes to popliteal only today                  OT Education - 07/03/20 1113    Education Details Continued skilled Pt/caregiver education  And LE ADL training throughout visit for lymphedema self care/ home program, including compression wrapping, compression garment and device wear/care, lymphatic pumping ther ex, simple self-MLD, and skin care. Discussed progress towards goals.    Person(s) Educated Patient    Methods Explanation;Demonstration    Comprehension Verbalized understanding;Returned demonstration;Need further instruction  OT Long Term Goals - 07/01/20 1222      OT LONG TERM GOAL #1   Title Pt will be able to apply LLE multi-layer, short stretch compression wraps daily with maximum caregiver assistance, using correct gradient techniques independently to return affected limb/s, as closely as possible, to premorbid size and shape, to limit infection risk, and to improve safe functional mobility and ADLs performance.    Baseline dependent    Time 4    Period Days    Status Achieved   Pt has no assistance at SNF for compression wrapping. Pt launders and rolls bandages independently. Pt still requires ongoing verbal cues to lift leg and reposition wheil assistant waraps. 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     OT LONG TERM GOAL #2   Title Pt will be able to verbalize signs and symptoms of cellulitis infection and identify 4 common lymphedema precautions using printed resource for reference (modified independence) to limit LE progression over time.    Baseline Max A    Time 4    Period Days    Status Achieved      OT LONG TERM GOAL #3   Title Pt will sustain than 85% compliance with all daily LE self-care home program components throughout Intensive Phase CDT, including impeccable skin care, lymphatic pumping therex,  compression wraps and simple self MLD, to ensure optimal limb volume reduction, to limit infection risk and to limit LE progression.     Baseline dependent    Time 12    Period Weeks    Status Achieved   Pt 85% compliant. Staff at SNF not always available to support 85% compliance     OT LONG TERM GOAL #4   Title Pt to achieve at least 10% LLE limb volume reduction during Intensive Phase CDT with Max CG assistance for wrapping, to improve functional performance of basic and instrumental ADLs, and to limit LE progression.   to date LLE reductions: A-D= 15.7%, E-G= 29.63%, A-G= 22.9%. RLE: A-D: 6.7%, E-G= 15.7%, A-G= 11.2%   Baseline dependent    Time 12    Period Weeks    Status Achieved      OT LONG TERM GOAL #5   Title During self-management phase of CDT Pt will retain limb volume reductions achieved during Intensive Phase CDT with no more than 3% volume increase using all LE self care components  to limit LE progression and further functional decline.    Baseline dependent    Time 6    Period Months    Status On-going                 Plan - 07/03/20 1239    Clinical Impression Statement RLE significantly more swollen with generalized distribution from ankle to groin today compared with last visit. R knee is very warm in keeping w/ OA flare in the joint. Pt also c/o increased pain ihn knee and burning sensation at proximal medial leg since yesterday. Pain decreased when she removed compression. We decided to postpone already overdue limb volumetrics again today due to increased swelling. Pt encouraged to report these symptoms to her new DR, or the PA. She agrees to report today. No other signs/ symptoms of infection are observed, so we completed RLE MLD and knee length wrap. Pt denied increased pain at end of session. Cobnt as per POC.    Occupational performance deficits (Please refer to evaluation for details): ADL's;IADL's;Rest and Sleep;Work;Leisure;Social Participation    Rehab Potential  Good    Clinical Decision Making Multiple treatment options, significant modification of task necessary    Comorbidities  Affecting Occupational Performance: Presence of comorbidities impacting occupational performance    Modification or Assistance to Complete Evaluation  Min-Moderate modification of tasks or assist with assess necessary to complete eval    OT Treatment/Interventions Self-care/ADL training;Therapeutic exercise;Functional Mobility Training;Manual Therapy;Energy conservation;Manual lymph drainage;Therapeutic activities;Coping strategies training;DME and/or AE instruction;Compression bandaging;Patient/family education    Plan Complete Deconhgestive Therapy (CDT) Intensive and Self -Management phases, consisting of Manual lymphatic Drainage (MLD), skin care, ther ex and compression bandaging    OT Home Exercise Plan lymphatic pumping therex 2-3 x q day, in sequence, 10 reps bilaterally    Recommended Other Services Daytime: 2 Pr. [employer]st EVAREX custom, ccl 2, A-D, 2.5 cm SB at oblique top edge, open toe, t heel.  HOS: Jobst RELAX custom ccl 2 A-D, OT w. posterior zipper           Patient will benefit from skilled therapeutic intervention in order to improve the following deficits and impairments:           Visit Diagnosis: Lymphedema, not elsewhere classified    Problem List Patient Active Problem List   Diagnosis Date Noted  . Iron deficiency 08/06/2018  . History of total left knee replacement (TKR) 07/18/2018  . Rectal bleeding 07/05/2018  . Hemorrhoids 07/05/2018  . Anal itching 07/05/2018  . Rotator cuff arthropathy of right shoulder   . Shoulder arthritis 06/15/2017  . Status post revision of total knee replacement, left 04/29/2017  . Polyethylene liner wear following left total knee arthroplasty requiring isolated polyethylene liner exchange ([place]) 03/02/2017  . 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 Unstable angina ([place]) 05/21/2016  . Chronic diastolic heart failure ([place]) 03/29/2016  . Normal coronary arteries 2009 01/14/2015  . Obesity-BMI 40  01/14/2015  . Restless leg 01/14/2015  . Chest pain 01/14/2015  . Back pain 01/14/2015  . Renal insufficiency 01/14/2015  . Dementia- mild memory issues 01/14/2015  . Occult blood positive stool 12/14/2014  . Anemia 12/14/2014  . Family history of colon cancer 12/14/2014  . Change in bowel habits 12/14/2014  . GERD (gastroesophageal reflux disease) 12/14/2014  . Dyspnea 05/17/2012  . Lymphedema 05/17/2012  . Weight gain 05/17/2012  . HTN (hypertension) 05/17/2012    Andrey Spearman, MS, OTR/L, [facility] 07/03/20 12:45 PM   [place] MAIN [facility] SERVICES [address] [place], [place], [address] Phone: [phone]   Fax:  [phone]  Name: [name] MRN: [id number] Date of Birth: Feb 12, 1946

## 2020-07-05 ENCOUNTER — Ambulatory Visit: Payer: Medicare Other | Admitting: Occupational Therapy

## 2020-07-05 ENCOUNTER — Other Ambulatory Visit: Payer: Self-pay

## 2020-07-05 DIAGNOSIS — I890 Lymphedema, not elsewhere classified: Secondary | ICD-10-CM

## 2020-07-05 NOTE — Therapy (Signed)
[place] MAIN [facility] SERVICES [address] [place], [place], [address] Phone: [phone]   Fax:  [phone]  Occupational Therapy Treatment  Patient Details  Name: [name] MRN: [id number] Date of Birth: 1945/12/03 Referring Provider (OT): Juluis Pitch, MD   Encounter Date: 07/05/2020   OT End of Session - 07/05/20 1032    Visit Number 51    Number of Visits 72    Date for OT Re-Evaluation 08/01/20    OT Start Time 0803    OT Stop Time 0912    OT Time Calculation (min) 69 min    Activity Tolerance Patient tolerated treatment well;Other (comment);No increased pain   NO SOB; no signs/ symptoms of infection   Behavior During Therapy WFL for tasks assessed/performed           Past Medical History:  Diagnosis Date  . Anemia   . Anxiety   . Arthritis    "hands, feet" (03/03/2017)  . Brain lesion    2 types  . Cataract   . Cervical spondylosis with myelopathy   . Chest pain    Normal cardiac cath 5/09  . Chronic lower back pain   . CKD (chronic kidney disease), stage III ([place])    [name]  . Congestive heart failure (CHF) ([place])    has a Cardiomems implant   . Constipation   . Dementia ([place])   . Depression   . Dumping syndrome   . Dyspnea    with activity  . Edema   . Fatigue   . Fibromyalgia   . GERD (gastroesophageal reflux disease)   . Headache    "w/high CBG" (03/03/2017)  . History of echocardiogram    a. Echo 03/20/16 (done at [facility] in [place], [place]):  mild LVH, EF 69%, normal diastolic function, mild LAE, MAC, RVSP 25 mmHg  . Hyperlipidemia   . Hypertension   . Hypothyroidism   . Lumbar spondylosis   . Lymphedema    seeing specialist for this  . Migraine    "mostly stopped when I changed my diet" (03/03/2017)  . OSA on CPAP   . Pneumonia X 1  . PONV (postoperative nausea and vomiting)   . Restless leg syndrome   . Rheumatoid arthritis ([place])   . Stroke [facility])    TIAs "mini  strokes"- unsure of last TIA - pt and daughter deny this  . Syncope   . Tremors of nervous system   . Type II diabetes mellitus ([place])     Past Surgical History:  Procedure Laterality Date  . APPENDECTOMY  1966  . BACK SURGERY    . CARDIAC CATHETERIZATION N/A 05/25/2016   Procedure: Right/Left Heart Cath and Coronary Angiography;  Surgeon: Larey Dresser, MD;  Location: [place] [facility];  Service: Cardiovascular;  Laterality: N/A;  . CATARACT EXTRACTION W/ INTRAOCULAR LENS  IMPLANT, BILATERAL Bilateral   . COLONOSCOPY    . CORONARY ANGIOGRAM  2009   Normal coronaries  . EXCISION/RELEASE BURSA HIP Bilateral   . I & D KNEE WITH POLY EXCHANGE Left 03/02/2017   Procedure: Left knee Revision of poly liner;  Surgeon: Mcarthur Rossetti, MD;  Location: [place];  Service: Orthopedics;  Laterality: Left;  . JOINT REPLACEMENT    . KNEE ARTHROSCOPY Bilateral   . LAPAROSCOPIC CHOLECYSTECTOMY  2015  . LMF  2017   CardiMEMS HF implant for CHF (measures amount of fluid in the heart)  . LUMBAR  [place] SURGERY    . LUMBAR LAMINECTOMY/DECOMPRESSION MICRODISCECTOMY Left 12/2005   L2-3 laminectomy and diskectomy/notes 01/06/2011  . POSTERIOR LUMBAR FUSION  04/2006   [name] 01/06/2011; "put cages in"  . REVERSE SHOULDER ARTHROPLASTY Left 06/15/2017   Procedure: REVERSE LEFT SHOULDER ARTHROPLASTY;  Surgeon: Meredith Pel, MD;  Location: [place];  Service: Orthopedics;  Laterality: Left;  . REVERSE SHOULDER ARTHROPLASTY Right 02/01/2018  . REVERSE SHOULDER ARTHROPLASTY Right 02/01/2018   Procedure: RIGHT REVERSE SHOULDER ARTHROPLASTY;  Surgeon: Meredith Pel, MD;  Location: [place];  Service: Orthopedics;  Laterality: Right;  . REVISION TOTAL KNEE ARTHROPLASTY Left 03/02/2017   poly liner/notes 03/02/2017  . RIGHT HEART CATH N/A 11/13/2016   Procedure: Right Heart Cath with Cardiomems;  Surgeon: Larey Dresser, MD;  Location: [place] [facility];  Service: Cardiovascular;  Laterality: N/A;  .  SHOULDER ARTHROSCOPY WITH ROTATOR CUFF REPAIR Right   . SHOULDER OPEN ROTATOR CUFF REPAIR Left 01/2011   [name] 01/29/2011  . TOTAL ABDOMINAL HYSTERECTOMY    . TOTAL KNEE ARTHROPLASTY Left   . TOTAL KNEE ARTHROPLASTY Right 11/18/2012   Procedure: TOTAL KNEE ARTHROPLASTY;  Surgeon: Yvette Rack., MD;  Location: [place];  Service: Orthopedics;  Laterality: Right;  . TUBAL LIGATION    . UPPER GASTROINTESTINAL ENDOSCOPY    . UPPER GI ENDOSCOPY      There were no vitals filed for this visit.   Subjective Assessment - 07/05/20 0811    Subjective  [name] presents to OT for Rx visit 62/72 to address BLE lipo-lymphedema (I89.0). Pt presents without compression wraps on RLE and WITH compression garment on the L. Pt denies LE related leg pain today. Pt reports she had a fall yesterday, sliding off of her bed, and had to remain in bed for 12 hours afterwards.    Pertinent History chronic BLE leg swelling and associated pain ~ 40 years; hx herniated intervertebral discL TKA; L total knee revision; HTN, Obesity. dementia; restless leg; CHF; unstable anina; R RC arthroplasty, OA, cervical spondylosis with myelopathy, chronic lower back pain, CKD (Stage?); Fibromyalgia; Hypothyroidism; OSA ( cPap?); hx pneumonia; RA, Type II Diabetes. S/P cardiac cathg, B THA, Lumbar laminectomy, posterior lumbar fusion, B shoulder arthroplasty; total abdominal hysterectomy    Limitations chronic leg swelling and associated pain, difficulty walking, impaired dynamic balance, muscle weakness,    Repetition Increases Symptoms    Special Tests + STEMMER sign base of toes bilaterally               LYMPHEDEMA/ONCOLOGY QUESTIONNAIRE - 07/05/20 0001      Right Lower Extremity Lymphedema   Other Total RLE A-D= 8813.0 ml. RLE E-G = 8486.1 ml. RLE A-G= 17299.1 ml    Other R leg volume reduction from ankle to tibial tuberosity  (A-D) measures 8.62% since initially measured on 02/09/20. R thigh (E-G) volume reduction measures  18.92 % since 6/18. And full RLE (A-G) volume reduction todate measures 14.3 %.       Left Lower Extremity Lymphedema   Other Total LLE leg  (A-D) volume reduction to date measures 15.7 %, thigh reduction measures 29.63 %, and full limb volume reduction measures 22.9%                   OT Treatments/Exercises (OP) - 07/05/20 0001      ADLs   ADL Education Given Yes      Manual Therapy   Manual Therapy Edema management;Manual Lymphatic Drainage (MLD);Compression Bandaging  Manual therapy comments skin care to LLE as established during MLD to improve hydration and limit infection risk    Manual Lymphatic Drainage (MLD) LLE MLD as established    Compression Bandaging LLE compression wraps from toes to popliteal only today                  OT Education - 07/05/20 1022    Education Details Continued skilled Pt/caregiver education  And LE ADL training throughout visit for lymphedema self care/ home program, including compression wrapping, compression garment and device wear/care, lymphatic pumping ther ex, simple self-MLD, and skin care. Discussed progress towards goals.    Person(s) Educated Patient    Methods Explanation;Demonstration    Comprehension Verbalized understanding;Returned demonstration;Need further instruction               OT Long Term Goals - 07/01/20 1222      OT LONG TERM GOAL #1   Title Pt will be able to apply LLE multi-layer, short stretch compression wraps daily with maximum caregiver assistance, using correct gradient techniques independently to return affected limb/s, as closely as possible, to premorbid size and shape, to limit infection risk, and to improve safe functional mobility and ADLs performance.    Baseline dependent    Time 4    Period Days    Status Achieved   Pt has no assistance at SNF for compression wrapping. Pt launders and rolls bandages independently. Pt still requires ongoing verbal cues to lift leg and reposition wheil  assistant waraps. 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     OT LONG TERM GOAL #2   Title Pt will be able to verbalize signs and symptoms of cellulitis infection and identify 4 common lymphedema precautions using printed resource for reference (modified independence) to limit LE progression over time.    Baseline Max A    Time 4    Period Days    Status Achieved      OT LONG TERM GOAL #3   Title Pt will sustain than 85% compliance with all daily LE self-care home program components throughout Intensive Phase CDT, including impeccable skin care, lymphatic pumping therex,  compression wraps and simple self MLD, to ensure optimal limb volume reduction, to limit infection risk and to limit LE progression.    Baseline dependent    Time 12    Period Weeks    Status Achieved   Pt 85% compliant. Staff at SNF not always available to support 85% compliance     OT LONG TERM GOAL #4   Title Pt to achieve at least 10% LLE limb volume reduction during Intensive Phase CDT with Max CG assistance for wrapping, to improve functional performance of basic and instrumental ADLs, and to limit LE progression.   to date LLE reductions: A-D= 15.7%, E-G= 29.63%, A-G= 22.9%. RLE: A-D: 6.7%, E-G= 15.7%, A-G= 11.2%   Baseline dependent    Time 12    Period Weeks    Status Achieved      OT LONG TERM GOAL #5   Title During self-management phase of CDT Pt will retain limb volume reductions achieved during Intensive Phase CDT with no more than 3% volume increase using all LE self care components  to limit LE progression and further functional decline.    Baseline dependent    Time 6    Period Months    Status On-going                 Plan -  07/05/20 1020    Clinical Impression Statement Completed RLE cmparative limb volumetrics this morning, and calculated volume reductions achieved bilaterally. R leg volume reduction from ankle to tibial tuberosity  (A-D) measures 8.62% since initially measured on 02/09/20.  R thigh (E-G) volume reduction measures 18.92 % since 6/18. And full RLE (A-G) volume reduction todate measures 14.3 %. Total LLE leg  (A-D) volume reduction to date measures 15.7 %, thigh reduction measures 29.63 %, and full limb volume reduction measures 22.9%. All values for each leg segment meet and exceed volume reduction goal of 10% except for L leg reduction. This value , however, appraoches goal range . Pt continues to respond well to CDT. Cont as per POc. Fit LLE custom garment as soon as available from DME vendor.    Occupational performance deficits (Please refer to evaluation for details): ADL's;IADL's;Rest and Sleep;Work;Leisure;Social Participation    Rehab Potential Good    Clinical Decision Making Multiple treatment options, significant modification of task necessary    Comorbidities Affecting Occupational Performance: Presence of comorbidities impacting occupational performance    Modification or Assistance to Complete Evaluation  Min-Moderate modification of tasks or assist with assess necessary to complete eval    OT Treatment/Interventions Self-care/ADL training;Therapeutic exercise;Functional Mobility Training;Manual Therapy;Energy conservation;Manual lymph drainage;Therapeutic activities;Coping strategies training;DME and/or AE instruction;Compression bandaging;Patient/family education    Plan Complete Deconhgestive Therapy (CDT) Intensive and Self -Management phases, consisting of Manual lymphatic Drainage (MLD), skin care, ther ex and compression bandaging    OT Home Exercise Plan lymphatic pumping therex 2-3 x q day, in sequence, 10 reps bilaterally    Recommended Other Services Daytime: 2 Pr. [employer] EVAREX custom, ccl 2, A-D, 2.5 cm SB at oblique top edge, open toe, t heel.  HOS: [employer] RELAX custom ccl 2 A-D, OT w. posterior zipper           Patient will benefit from skilled therapeutic intervention in order to improve the following deficits and impairments:            Visit Diagnosis: Lymphedema, not elsewhere classified    Problem List Patient Active Problem List   Diagnosis Date Noted  . Iron deficiency 08/06/2018  . History of total left knee replacement (TKR) 07/18/2018  . Rectal bleeding 07/05/2018  . Hemorrhoids 07/05/2018  . Anal itching 07/05/2018  . Rotator cuff arthropathy of right shoulder   . Shoulder arthritis 06/15/2017  . Status post revision of total knee replacement, left 04/29/2017  . Polyethylene liner wear following left total knee arthroplasty requiring isolated polyethylene liner exchange ([place]) 03/02/2017  . 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 Unstable angina ([place]) 05/21/2016  . Chronic diastolic heart failure ([place]) 03/29/2016  . Normal coronary arteries 2009 01/14/2015  . Obesity-BMI 40 01/14/2015  . Restless leg 01/14/2015  . Chest pain 01/14/2015  . Back pain 01/14/2015  . Renal insufficiency 01/14/2015  . Dementia- mild memory issues 01/14/2015  . Occult blood positive stool 12/14/2014  . Anemia 12/14/2014  . Family history of colon cancer 12/14/2014  . Change in bowel habits 12/14/2014  . GERD (gastroesophageal reflux disease) 12/14/2014  . Dyspnea 05/17/2012  . Lymphedema 05/17/2012  . Weight gain 05/17/2012  . HTN (hypertension) 05/17/2012    Andrey Spearman, MS, OTR/L, [facility] 07/05/20 10:34 AM  [place] MAIN [facility] SERVICES [address] [place], [place], [address] Phone: [phone]   Fax:  [phone]  Name: [name] MRN: [id number] Date of Birth: August 15, 1946

## 2020-07-08 ENCOUNTER — Other Ambulatory Visit: Payer: Self-pay

## 2020-07-08 ENCOUNTER — Ambulatory Visit: Payer: Medicare Other | Admitting: Occupational Therapy

## 2020-07-08 DIAGNOSIS — I890 Lymphedema, not elsewhere classified: Secondary | ICD-10-CM

## 2020-07-08 NOTE — Therapy (Signed)
[place] MAIN [facility] SERVICES [address] [place], [place], [address] Phone: [phone]   Fax:  [phone]  Occupational Therapy Treatment  Patient Details  Name: [name] MRN: [id number] Date of Birth: May 06, 1946 Referring Provider (OT): [name] Pitch, MD   Encounter Date: 07/08/2020   OT End of Session - 07/08/20 1238    Visit Number 99    Number of Visits 31    Date for OT Re-Evaluation 08/01/20    OT Start Time 1005    OT Stop Time 1110    OT Time Calculation (min) 65 min    Activity Tolerance Patient tolerated treatment well;No increased pain;Patient limited by pain   Chronic back pain worse since sliding off of bed when matress slid on frame. Denies leg pain   Behavior During Therapy [facility] for tasks assessed/performed   sleepiness throughout session          Past Medical History:  Diagnosis Date   Anemia    Anxiety    Arthritis    "hands, feet" (03/03/2017)   Brain lesion    2 types   Cataract    Cervical spondylosis with myelopathy    Chest pain    Normal cardiac cath 5/09   Chronic lower back pain    CKD (chronic kidney disease), stage III ([place])    [name]   Congestive heart failure (CHF) ([place])    has a Cardiomems implant    Constipation    Dementia ([place])    Depression    Dumping syndrome    Dyspnea    with activity   Edema    Fatigue    Fibromyalgia    GERD (gastroesophageal reflux disease)    Headache    "w/high CBG" (03/03/2017)   History of echocardiogram    a. Echo 03/20/16 (done at [facility] in [place], [place]):  mild LVH, EF 35%, normal diastolic function, mild LAE, MAC, RVSP 25 mmHg   Hyperlipidemia    Hypertension    Hypothyroidism    Lumbar spondylosis    Lymphedema    seeing specialist for this   Migraine    "mostly stopped when I changed my diet" (03/03/2017)   OSA on CPAP    Pneumonia X 1   PONV (postoperative nausea and vomiting)     Restless leg syndrome    Rheumatoid arthritis ([place])    Stroke ([place])    TIAs "mini strokes"- unsure of last TIA - pt and daughter deny this   Syncope    Tremors of nervous system    Type II diabetes mellitus ([place])     Past Surgical History:  Procedure Laterality Date   APPENDECTOMY  1966   BACK SURGERY     CARDIAC CATHETERIZATION N/A 05/25/2016   Procedure: Right/Left Heart Cath and Coronary Angiography;  Surgeon: [name] Dresser, MD;  Location: [place] [facility];  Service: Cardiovascular;  Laterality: N/A;   CATARACT EXTRACTION W/ INTRAOCULAR LENS  IMPLANT, BILATERAL Bilateral    COLONOSCOPY     CORONARY ANGIOGRAM  2009   Normal coronaries   EXCISION/RELEASE BURSA HIP Bilateral    I & D KNEE WITH POLY EXCHANGE Left 03/02/2017   Procedure: Left knee Revision of poly liner;  Surgeon: [name] Rossetti, MD;  Location: [place];  Service: Orthopedics;  Laterality: Left;   JOINT REPLACEMENT     KNEE ARTHROSCOPY Bilateral    LAPAROSCOPIC CHOLECYSTECTOMY  2015   LMF  2017  CardiMEMS HF implant for CHF (measures amount of fluid in the heart)   LUMBAR DISC SURGERY     LUMBAR LAMINECTOMY/DECOMPRESSION MICRODISCECTOMY Left 12/2005   L2-3 laminectomy and diskectomy/notes 01/06/2011   POSTERIOR LUMBAR FUSION  04/2006   [name] 01/06/2011; "put cages in"   REVERSE SHOULDER ARTHROPLASTY Left 06/15/2017   Procedure: REVERSE LEFT SHOULDER ARTHROPLASTY;  Surgeon: [name] Pel, MD;  Location: [place];  Service: Orthopedics;  Laterality: Left;   REVERSE SHOULDER ARTHROPLASTY Right 02/01/2018   REVERSE SHOULDER ARTHROPLASTY Right 02/01/2018   Procedure: RIGHT REVERSE SHOULDER ARTHROPLASTY;  Surgeon: [name] Pel, MD;  Location: [place];  Service: Orthopedics;  Laterality: Right;   REVISION TOTAL KNEE ARTHROPLASTY Left 03/02/2017   poly liner/notes 03/02/2017   RIGHT HEART CATH N/A 11/13/2016   Procedure: Right Heart Cath with Cardiomems;  Surgeon: [name] Dresser, MD;  Location: [place] [facility];  Service: Cardiovascular;  Laterality: N/A;   SHOULDER ARTHROSCOPY WITH ROTATOR CUFF REPAIR Right    SHOULDER OPEN ROTATOR CUFF REPAIR Left 01/2011   [name] 01/29/2011   TOTAL ABDOMINAL HYSTERECTOMY     TOTAL KNEE ARTHROPLASTY Left    TOTAL KNEE ARTHROPLASTY Right 11/18/2012   Procedure: TOTAL KNEE ARTHROPLASTY;  Surgeon: [name]., MD;  Location: [place];  Service: Orthopedics;  Laterality: Right;   TUBAL LIGATION     UPPER GASTROINTESTINAL ENDOSCOPY     UPPER GI ENDOSCOPY      There were no vitals filed for this visit.   Subjective Assessment - 07/08/20 1015    Subjective  [name] presents to OT for Rx visit 63/72 to address BLE lipo-lymphedema (I89.0). Pt presents without compression wraps on RLE and WITH compression garment on the L. Pt reports she had increased pain all weekend after sliding off the bed last week. Pt reooirts increased difficulty with transfers as well. Pt reports 6/10 back pain radiating down L leg this morning.    Pertinent History chronic BLE leg swelling and associated pain ~ 40 years; hx herniated intervertebral discL TKA; L total knee revision; HTN, Obesity. dementia; restless leg; CHF; unstable anina; R RC arthroplasty, OA, cervical spondylosis with myelopathy, chronic lower back pain, CKD (Stage?); Fibromyalgia; Hypothyroidism; OSA ( cPap?); hx pneumonia; RA, Type II Diabetes. S/P cardiac cathg, B THA, Lumbar laminectomy, posterior lumbar fusion, B shoulder arthroplasty; total abdominal hysterectomy    Limitations chronic leg swelling and associated pain, difficulty walking, impaired dynamic balance, muscle weakness,    Repetition Increases Symptoms    Special Tests + STEMMER sign base of toes bilaterally                        OT Treatments/Exercises (OP) - 07/08/20 0001      ADLs   ADL Education Given Yes      Manual Therapy   Manual Therapy Edema management;Manual Lymphatic Drainage  (MLD);Compression Bandaging    Edema Management RLE skin care as establ;ished    Manual Lymphatic Drainage (MLD) RLE MLD as established. fibrosis techniqes to distal leg and ankle fat pads    Compression Bandaging RLE compression wraps from toes to popliteal. LLE custom cmpression knee high in place correctly.                  OT Education - 07/08/20 1237    Education Details Continued skilled Pt/caregiver education  And LE ADL training throughout visit for lymphedema self care/ home program, including compression wrapping, compression garment and  device wear/care, lymphatic pumping ther ex, simple self-MLD, and skin care. Discussed progress towards goals.    Person(s) Educated Patient    Methods Explanation;Demonstration    Comprehension Verbalized understanding;Returned demonstration;Need further instruction               OT Long Term Goals - 07/01/20 1222      OT LONG TERM GOAL #1   Title Pt will be able to apply LLE multi-layer, short stretch compression wraps daily with maximum caregiver assistance, using correct gradient techniques independently to return affected limb/s, as closely as possible, to premorbid size and shape, to limit infection risk, and to improve safe functional mobility and ADLs performance.    Baseline dependent    Time 4    Period Days    Status Achieved   Pt has no assistance at SNF for compression wrapping. Pt launders and rolls bandages independently. Pt still requires ongoing verbal cues to lift leg and reposition wheil assistant waraps. 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     OT LONG TERM GOAL #2   Title Pt will be able to verbalize signs and symptoms of cellulitis infection and identify 4 common lymphedema precautions using printed resource for reference (modified independence) to limit LE progression over time.    Baseline Max A    Time 4    Period Days    Status Achieved      OT LONG TERM GOAL #3   Title Pt will sustain than 85%  compliance with all daily LE self-care home program components throughout Intensive Phase CDT, including impeccable skin care, lymphatic pumping therex,  compression wraps and simple self MLD, to ensure optimal limb volume reduction, to limit infection risk and to limit LE progression.    Baseline dependent    Time 12    Period Weeks    Status Achieved   Pt 85% compliant. Staff at SNF not always available to support 85% compliance     OT LONG TERM GOAL #4   Title Pt to achieve at least 10% LLE limb volume reduction during Intensive Phase CDT with Max CG assistance for wrapping, to improve functional performance of basic and instrumental ADLs, and to limit LE progression.   to date LLE reductions: A-D= 15.7%, E-G= 29.63%, A-G= 22.9%. RLE: A-D: 6.7%, E-G= 15.7%, A-G= 11.2%   Baseline dependent    Time 12    Period Weeks    Status Achieved      OT LONG TERM GOAL #5   Title During self-management phase of CDT Pt will retain limb volume reductions achieved during Intensive Phase CDT with no more than 3% volume increase using all LE self care components  to limit LE progression and further functional decline.    Baseline dependent    Time 6    Period Months    Status On-going                 Plan - 07/08/20 1240    Clinical Impression Statement Pt with more difficulty than usual completing WC to bed transfer due to increased back pain since fall last week. Mod A and extra time needed to left legs onto Rx bed, and increased difficulty with positioning once supine w/ head elevated ~ 30 degrees.R leg slightly more swoolen and denser w protein rich congestion this morning. I suspect Pt has been out of wraps since day after last visit. No signs of infection. Pt more sleepy than typical throughout session. She tolerated MLD ,  skin care and wraps without increased pain. Assisted with donning shoes due to time limits. Cont as per POC. RLE knee high and HOS device have shipped and are due to arrive  any day.    Occupational performance deficits (Please refer to evaluation for details): ADL's;IADL's;Rest and Sleep;Work;Leisure;Social Participation    Rehab Potential Good    Clinical Decision Making Multiple treatment options, significant modification of task necessary    Comorbidities Affecting Occupational Performance: Presence of comorbidities impacting occupational performance    Modification or Assistance to Complete Evaluation  Min-Moderate modification of tasks or assist with assess necessary to complete eval    OT Treatment/Interventions Self-care/ADL training;Therapeutic exercise;Functional Mobility Training;Manual Therapy;Energy conservation;Manual lymph drainage;Therapeutic activities;Coping strategies training;DME and/or AE instruction;Compression bandaging;Patient/family education    Plan Complete Deconhgestive Therapy (CDT) Intensive and Self -Management phases, consisting of Manual lymphatic Drainage (MLD), skin care, ther ex and compression bandaging    OT Home Exercise Plan lymphatic pumping therex 2-3 x q day, in sequence, 10 reps bilaterally    Recommended Other Services Daytime: 2 Pr. Jobst EVAREX custom, ccl 2, A-D, 2.5 cm SB at oblique top edge, open toe, t heel.  HOS: Jobst RELAX custom ccl 2 A-D, OT w. posterior zipper           Patient will benefit from skilled therapeutic intervention in order to improve the following deficits and impairments:           Visit Diagnosis: Lymphedema, not elsewhere classified    Problem List Patient Active Problem List   Diagnosis Date Noted   Iron deficiency 08/06/2018   History of total left knee replacement (TKR) 07/18/2018   Rectal bleeding 07/05/2018   Hemorrhoids 07/05/2018   Anal itching 07/05/2018   Rotator cuff arthropathy of right shoulder    Shoulder arthritis 06/15/2017   Status post revision of total knee replacement, left 04/29/2017   Polyethylene liner wear following left total knee arthroplasty  requiring isolated polyethylene liner exchange ([place]) 03/02/2017   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[place]) 03/02/2017   Unstable angina ([place]) 05/21/2016   Chronic diastolic heart failure ([place]) 03/29/2016   Normal coronary arteries 2009 01/14/2015   Obesity-BMI 40 01/14/2015   Restless leg 01/14/2015   Chest pain 01/14/2015   Back pain 01/14/2015   Renal insufficiency 01/14/2015   Dementia- mild memory issues 01/14/2015   Occult blood positive stool 12/14/2014   Anemia 12/14/2014   Family history of colon cancer 12/14/2014   Change in bowel habits 12/14/2014   GERD (gastroesophageal reflux disease) 12/14/2014   Dyspnea 05/17/2012   Lymphedema 05/17/2012   Weight gain 05/17/2012   HTN (hypertension) 05/17/2012    Andrey Spearman, MS, OTR/L, [facility] 07/08/20 12:46 PM  [place] MAIN [facility] SERVICES [address] [place], [place], [address] Phone: [phone]   Fax:  [phone]  Name: [name] MRN: [id number] Date of Birth: [birth year]

## 2020-07-10 ENCOUNTER — Other Ambulatory Visit: Payer: Self-pay

## 2020-07-10 ENCOUNTER — Ambulatory Visit: Payer: Medicare Other | Admitting: Occupational Therapy

## 2020-07-10 DIAGNOSIS — I890 Lymphedema, not elsewhere classified: Secondary | ICD-10-CM

## 2020-07-10 NOTE — Therapy (Signed)
[name] MAIN [facility] SERVICES [address] [place], [place], [address] Phone: [phone]   Fax:  [phone]  Occupational Therapy Treatment  Patient Details  Name: [name] MRN: [id number] Date of Birth: [birth year] Referring Provider (OT): Juluis Pitch, MD   Encounter Date: 07/10/2020   OT End of Session - 07/10/20 1253    Visit Number 45    Number of Visits 72    Date for OT Re-Evaluation 08/01/20    OT Start Time 1004    OT Stop Time 1110    OT Time Calculation (min) 66 min    Activity Tolerance Patient tolerated treatment well;No increased pain;Patient limited by pain   Chronic back pain worse since sliding off of bed when matress slid on frame. Denies leg pain   Behavior During Therapy [facility] for tasks assessed/performed   sleepiness throughout session          Past Medical History:  Diagnosis Date  . Anemia   . Anxiety   . Arthritis    "hands, feet" (03/03/2017)  . Brain lesion    2 types  . Cataract   . Cervical spondylosis with myelopathy   . Chest pain    Normal cardiac cath 5/09  . Chronic lower back pain   . CKD (chronic kidney disease), stage III ([place])    [name]  . Congestive heart failure (CHF) ([place])    has a Cardiomems implant   . Constipation   . Dementia ([place])   . Depression   . Dumping syndrome   . Dyspnea    with activity  . Edema   . Fatigue   . Fibromyalgia   . GERD (gastroesophageal reflux disease)   . Headache    "w/high CBG" (03/03/2017)  . History of echocardiogram    a. Echo 03/20/16 (done at [facility] in [place], [place]):  mild LVH, EF 99%, normal diastolic function, mild LAE, MAC, RVSP 25 mmHg  . Hyperlipidemia   . Hypertension   . Hypothyroidism   . Lumbar spondylosis   . Lymphedema    seeing specialist for this  . Migraine    "mostly stopped when I changed my diet" (03/03/2017)  . OSA on CPAP   . Pneumonia X 1  . PONV (postoperative nausea and vomiting)    . Restless leg syndrome   . Rheumatoid arthritis ([place])   . Stroke [facility])    TIAs "mini strokes"- unsure of last TIA - pt and daughter deny this  . Syncope   . Tremors of nervous system   . Type II diabetes mellitus ([place])     Past Surgical History:  Procedure Laterality Date  . APPENDECTOMY  1966  . BACK SURGERY    . CARDIAC CATHETERIZATION N/A 05/25/2016   Procedure: Right/Left Heart Cath and Coronary Angiography;  Surgeon: Larey Dresser, MD;  Location: [place] [facility];  Service: Cardiovascular;  Laterality: N/A;  . CATARACT EXTRACTION W/ INTRAOCULAR LENS  IMPLANT, BILATERAL Bilateral   . COLONOSCOPY    . CORONARY ANGIOGRAM  2009   Normal coronaries  . EXCISION/RELEASE BURSA HIP Bilateral   . I & D KNEE WITH POLY EXCHANGE Left 03/02/2017   Procedure: Left knee Revision of poly liner;  Surgeon: Mcarthur Rossetti, MD;  Location: [place];  Service: Orthopedics;  Laterality: Left;  . JOINT REPLACEMENT    . KNEE ARTHROSCOPY Bilateral   . LAPAROSCOPIC CHOLECYSTECTOMY  2015  . LMF  2017  CardiMEMS HF implant for CHF (measures amount of fluid in the heart)  . LUMBAR DISC SURGERY    . LUMBAR LAMINECTOMY/DECOMPRESSION MICRODISCECTOMY Left 12/2005   L2-3 laminectomy and diskectomy/notes 01/06/2011  . POSTERIOR LUMBAR FUSION  04/2006   [name] 01/06/2011; "put cages in"  . REVERSE SHOULDER ARTHROPLASTY Left 06/15/2017   Procedure: REVERSE LEFT SHOULDER ARTHROPLASTY;  Surgeon: Meredith Pel, MD;  Location: [place];  Service: Orthopedics;  Laterality: Left;  . REVERSE SHOULDER ARTHROPLASTY Right 02/01/2018  . REVERSE SHOULDER ARTHROPLASTY Right 02/01/2018   Procedure: RIGHT REVERSE SHOULDER ARTHROPLASTY;  Surgeon: Meredith Pel, MD;  Location: [place];  Service: Orthopedics;  Laterality: Right;  . REVISION TOTAL KNEE ARTHROPLASTY Left 03/02/2017   poly liner/notes 03/02/2017  . RIGHT HEART CATH N/A 11/13/2016   Procedure: Right Heart Cath with Cardiomems;  Surgeon: Larey Dresser, MD;  Location: [place] [facility];  Service: Cardiovascular;  Laterality: N/A;  . SHOULDER ARTHROSCOPY WITH ROTATOR CUFF REPAIR Right   . SHOULDER OPEN ROTATOR CUFF REPAIR Left 01/2011   [name] 01/29/2011  . TOTAL ABDOMINAL HYSTERECTOMY    . TOTAL KNEE ARTHROPLASTY Left   . TOTAL KNEE ARTHROPLASTY Right 11/18/2012   Procedure: TOTAL KNEE ARTHROPLASTY;  Surgeon: Yvette Rack., MD;  Location: [place];  Service: Orthopedics;  Laterality: Right;  . TUBAL LIGATION    . UPPER GASTROINTESTINAL ENDOSCOPY    . UPPER GI ENDOSCOPY      There were no vitals filed for this visit.   Subjective Assessment - 07/10/20 1233    Subjective  [name] presents to OT for Rx visit 64/72 to address BLE lipo-lymphedema (I89.0). Pt's R custom compression stockings and HOS device has arrived from vendor and is ready for fitting.    Pertinent History chronic BLE leg swelling and associated pain ~ 40 years; hx herniated intervertebral discL TKA; L total knee revision; HTN, Obesity. dementia; restless leg; CHF; unstable anina; R RC arthroplasty, OA, cervical spondylosis with myelopathy, chronic lower back pain, CKD (Stage?); Fibromyalgia; Hypothyroidism; OSA ( cPap?); hx pneumonia; RA, Type II Diabetes. S/P cardiac cathg, B THA, Lumbar laminectomy, posterior lumbar fusion, B shoulder arthroplasty; total abdominal hysterectomy    Limitations chronic leg swelling and associated pain, difficulty walking, impaired dynamic balance, muscle weakness,    Repetition Increases Symptoms    Special Tests + STEMMER sign base of toes bilaterally                        OT Treatments/Exercises (OP) - 07/10/20 0001      ADLs   ADL Education Given Yes      Manual Therapy   Manual Therapy Edema management    Edema Management RLE custom garment and HOS device fittings                  OT Education - 07/10/20 1251    Education Details Continued skilled Pt/caregiver education  And LE ADL training  throughout visit for lymphedema self care/ home program, including compression wrapping, compression garment and device wear/care, lymphatic pumping ther ex, simple self-MLD, and skin care. Discussed progress towards goals.    Person(s) Educated Patient    Methods Explanation;Demonstration    Comprehension Verbalized understanding;Returned demonstration;Need further instruction               OT Long Term Goals - 07/01/20 1222      OT LONG TERM GOAL #1   Title Pt will be able  to apply LLE multi-layer, short stretch compression wraps daily with maximum caregiver assistance, using correct gradient techniques independently to return affected limb/s, as closely as possible, to premorbid size and shape, to limit infection risk, and to improve safe functional mobility and ADLs performance.    Baseline dependent    Time 4    Period Days    Status Achieved   Pt has no assistance at SNF for compression wrapping. Pt launders and rolls bandages independently. Pt still requires ongoing verbal cues to lift leg and reposition wheil assistant waraps. 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     OT LONG TERM GOAL #2   Title Pt will be able to verbalize signs and symptoms of cellulitis infection and identify 4 common lymphedema precautions using printed resource for reference (modified independence) to limit LE progression over time.    Baseline Max A    Time 4    Period Days    Status Achieved      OT LONG TERM GOAL #3   Title Pt will sustain than 85% compliance with all daily LE self-care home program components throughout Intensive Phase CDT, including impeccable skin care, lymphatic pumping therex,  compression wraps and simple self MLD, to ensure optimal limb volume reduction, to limit infection risk and to limit LE progression.    Baseline dependent    Time 12    Period Weeks    Status Achieved   Pt 85% compliant. Staff at SNF not always available to support 85% compliance     OT LONG TERM  GOAL #4   Title Pt to achieve at least 10% LLE limb volume reduction during Intensive Phase CDT with Max CG assistance for wrapping, to improve functional performance of basic and instrumental ADLs, and to limit LE progression.   to date LLE reductions: A-D= 15.7%, E-G= 29.63%, A-G= 22.9%. RLE: A-D: 6.7%, E-G= 15.7%, A-G= 11.2%   Baseline dependent    Time 12    Period Weeks    Status Achieved      OT LONG TERM GOAL #5   Title During self-management phase of CDT Pt will retain limb volume reductions achieved during Intensive Phase CDT with no more than 3% volume increase using all LE self care components  to limit LE progression and further functional decline.    Baseline dependent    Time 6    Period Months    Status On-going                 Plan - 07/10/20 1253    Clinical Impression Statement Completed fittings and initial assessment of RLE custom knee high compression garments and R HOS device. Elvarex ccl 2 knee high fits very well. Both garments available today for fitting. R Jobst RELAX HOS garment also fits very well. Pt will wear both over visit interval and we'll complete assessment on next visit. Cont as per POC.    Occupational performance deficits (Please refer to evaluation for details): ADL's;IADL's;Rest and Sleep;Work;Leisure;Social Participation    Rehab Potential Good    Clinical Decision Making Multiple treatment options, significant modification of task necessary    Comorbidities Affecting Occupational Performance: Presence of comorbidities impacting occupational performance    Modification or Assistance to Complete Evaluation  Min-Moderate modification of tasks or assist with assess necessary to complete eval    OT Treatment/Interventions Self-care/ADL training;Therapeutic exercise;Functional Mobility Training;Manual Therapy;Energy conservation;Manual lymph drainage;Therapeutic activities;Coping strategies training;DME and/or AE instruction;Compression  bandaging;Patient/family education    Plan Complete Deconhgestive  Therapy (CDT) Intensive and Self -Management phases, consisting of Manual lymphatic Drainage (MLD), skin care, ther ex and compression bandaging    OT Home Exercise Plan lymphatic pumping therex 2-3 x q day, in sequence, 10 reps bilaterally    Recommended Other Services Daytime: 2 Pr. Jobst EVAREX custom, ccl 2, A-D, 2.5 cm SB at oblique top edge, open toe, t heel.  HOS: Jobst RELAX custom ccl 2 A-D, OT w. posterior zipper           Patient will benefit from skilled therapeutic intervention in order to improve the following deficits and impairments:           Visit Diagnosis: Lymphedema, not elsewhere classified    Problem List Patient Active Problem List   Diagnosis Date Noted  . Iron deficiency 08/06/2018  . History of total left knee replacement (TKR) 07/18/2018  . Rectal bleeding 07/05/2018  . Hemorrhoids 07/05/2018  . Anal itching 07/05/2018  . Rotator cuff arthropathy of right shoulder   . Shoulder arthritis 06/15/2017  . Status post revision of total knee replacement, left 04/29/2017  . Polyethylene liner wear following left total knee arthroplasty requiring isolated polyethylene liner exchange ([place]) 03/02/2017  . 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 Unstable angina ([place]) 05/21/2016  . Chronic diastolic heart failure ([place]) 03/29/2016  . Normal coronary arteries 2009 01/14/2015  . Obesity-BMI 40 01/14/2015  . Restless leg 01/14/2015  . Chest pain 01/14/2015  . Back pain 01/14/2015  . Renal insufficiency 01/14/2015  . Dementia- mild memory issues 01/14/2015  . Occult blood positive stool 12/14/2014  . Anemia 12/14/2014  . Family history of colon cancer 12/14/2014  . Change in bowel habits 12/14/2014  . GERD (gastroesophageal reflux disease) 12/14/2014  . Dyspnea 05/17/2012  . Lymphedema 05/17/2012  . Weight gain 05/17/2012  . HTN (hypertension) 05/17/2012     Andrey Spearman, MS, OTR/L, [facility] 07/10/20 12:57 PM   [place] MAIN [facility] SERVICES [address] [place], [place], [address] Phone: [phone]   Fax:  [phone]  Name: [name] MRN: [id number] Date of Birth: [birth year]

## 2020-07-12 ENCOUNTER — Ambulatory Visit: Payer: Medicare Other | Admitting: Occupational Therapy

## 2020-07-12 ENCOUNTER — Other Ambulatory Visit: Payer: Self-pay

## 2020-07-12 DIAGNOSIS — I890 Lymphedema, not elsewhere classified: Secondary | ICD-10-CM

## 2020-07-12 NOTE — Therapy (Signed)
[place] MAIN [facility] SERVICES [address] [place], [place], [address] Phone: [phone]   Fax:  [phone]  Occupational Therapy Treatment  Patient Details  Name: [name] MRN: [id number] Date of Birth: [birth year] Referring Provider (OT): Juluis Pitch, MD   Encounter Date: 07/12/2020   OT End of Session - 07/12/20 0900    Visit Number 65    Number of Visits 72    Date for OT Re-Evaluation 08/01/20    OT Start Time 0803    OT Stop Time 0903    OT Time Calculation (min) 60 min    Activity Tolerance Patient tolerated treatment well;No increased pain;Patient limited by pain   Chronic back pain worse since sliding off of bed when matress slid on frame. Denies leg pain   Behavior During Therapy [facility] for tasks assessed/performed   sleepiness throughout session          Past Medical History:  Diagnosis Date  . Anemia   . Anxiety   . Arthritis    "hands, feet" (03/03/2017)  . Brain lesion    2 types  . Cataract   . Cervical spondylosis with myelopathy   . Chest pain    Normal cardiac cath 5/09  . Chronic lower back pain   . CKD (chronic kidney disease), stage III ([place])    [name]  . Congestive heart failure (CHF) ([place])    has a Cardiomems implant   . Constipation   . Dementia ([place])   . Depression   . Dumping syndrome   . Dyspnea    with activity  . Edema   . Fatigue   . Fibromyalgia   . GERD (gastroesophageal reflux disease)   . Headache    "w/high CBG" (03/03/2017)  . History of echocardiogram    a. Echo 03/20/16 (done at [facility] in [place], [place]):  mild LVH, EF 60%, normal diastolic function, mild LAE, MAC, RVSP 25 mmHg  . Hyperlipidemia   . Hypertension   . Hypothyroidism   . Lumbar spondylosis   . Lymphedema    seeing specialist for this  . Migraine    "mostly stopped when I changed my diet" (03/03/2017)  . OSA on CPAP   . Pneumonia X 1  . PONV (postoperative nausea and vomiting)    . Restless leg syndrome   . Rheumatoid arthritis ([place])   . Stroke [facility])    TIAs "mini strokes"- unsure of last TIA - pt and daughter deny this  . Syncope   . Tremors of nervous system   . Type II diabetes mellitus ([place])     Past Surgical History:  Procedure Laterality Date  . APPENDECTOMY  1966  . BACK SURGERY    . CARDIAC CATHETERIZATION N/A 05/25/2016   Procedure: Right/Left Heart Cath and Coronary Angiography;  Surgeon: Larey Dresser, MD;  Location: [place] CV [facility];  Service: Cardiovascular;  Laterality: N/A;  . CATARACT EXTRACTION W/ INTRAOCULAR LENS  IMPLANT, BILATERAL Bilateral   . COLONOSCOPY    . CORONARY ANGIOGRAM  2009   Normal coronaries  . EXCISION/RELEASE BURSA HIP Bilateral   . I & D KNEE WITH POLY EXCHANGE Left 03/02/2017   Procedure: Left knee Revision of poly liner;  Surgeon: Mcarthur Rossetti, MD;  Location: [place];  Service: Orthopedics;  Laterality: Left;  . JOINT REPLACEMENT    . KNEE ARTHROSCOPY Bilateral   . LAPAROSCOPIC CHOLECYSTECTOMY  2015  . LMF  2017  CardiMEMS HF implant for CHF (measures amount of fluid in the heart)  . LUMBAR DISC SURGERY    . LUMBAR LAMINECTOMY/DECOMPRESSION MICRODISCECTOMY Left 12/2005   L2-3 laminectomy and diskectomy/notes 01/06/2011  . POSTERIOR LUMBAR FUSION  04/2006   [name] 01/06/2011; "put cages in"  . REVERSE SHOULDER ARTHROPLASTY Left 06/15/2017   Procedure: REVERSE LEFT SHOULDER ARTHROPLASTY;  Surgeon: Meredith Pel, MD;  Location: [place];  Service: Orthopedics;  Laterality: Left;  . REVERSE SHOULDER ARTHROPLASTY Right 02/01/2018  . REVERSE SHOULDER ARTHROPLASTY Right 02/01/2018   Procedure: RIGHT REVERSE SHOULDER ARTHROPLASTY;  Surgeon: Meredith Pel, MD;  Location: [place];  Service: Orthopedics;  Laterality: Right;  . REVISION TOTAL KNEE ARTHROPLASTY Left 03/02/2017   poly liner/notes 03/02/2017  . RIGHT HEART CATH N/A 11/13/2016   Procedure: Right Heart Cath with Cardiomems;  Surgeon: Larey Dresser, MD;  Location: [place] CV [facility];  Service: Cardiovascular;  Laterality: N/A;  . SHOULDER ARTHROSCOPY WITH ROTATOR CUFF REPAIR Right   . SHOULDER OPEN ROTATOR CUFF REPAIR Left 01/2011   [name] 01/29/2011  . TOTAL ABDOMINAL HYSTERECTOMY    . TOTAL KNEE ARTHROPLASTY Left   . TOTAL KNEE ARTHROPLASTY Right 11/18/2012   Procedure: TOTAL KNEE ARTHROPLASTY;  Surgeon: Yvette Rack., MD;  Location: [place];  Service: Orthopedics;  Laterality: Right;  . TUBAL LIGATION    . UPPER GASTROINTESTINAL ENDOSCOPY    . UPPER GI ENDOSCOPY      There were no vitals filed for this visit.   Subjective Assessment - 07/12/20 0816    Subjective  [name] presents to OT for Rx visit 65/72 to address BLE lipo-lymphedema (I89.0). Pt presents wearng BLE custom compression stockings. She states she is pleased w fit and comfort.  Pt reports 7/10 pain in L leg.    Pertinent History chronic BLE leg swelling and associated pain ~ 40 years; hx herniated intervertebral discL TKA; L total knee revision; HTN, Obesity. dementia; restless leg; CHF; unstable anina; R RC arthroplasty, OA, cervical spondylosis with myelopathy, chronic lower back pain, CKD (Stage?); Fibromyalgia; Hypothyroidism; OSA ( cPap?); hx pneumonia; RA, Type II Diabetes. S/P cardiac cathg, B THA, Lumbar laminectomy, posterior lumbar fusion, B shoulder arthroplasty; total abdominal hysterectomy    Limitations chronic leg swelling and associated pain, difficulty walking, impaired dynamic balance, muscle weakness,    Repetition Increases Symptoms    Special Tests + STEMMER sign base of toes bilaterally                        OT Treatments/Exercises (OP) - 07/12/20 0001      ADLs   ADL Education Given Yes      Manual Therapy   Manual Therapy Edema management;Manual Lymphatic Drainage (MLD);Compression Bandaging    Manual Lymphatic Drainage (MLD) LLE MLD as established. fibrosis techniqes to distal leg and ankle fat pads    Compression  Bandaging Max A to don and doff L custom stocking                  OT Education - 07/12/20 0859    Education Details Continued skilled Pt/caregiver education  And LE ADL training throughout visit for lymphedema self care/ home program, including compression wrapping, compression garment and device wear/care, lymphatic pumping ther ex, simple self-MLD, and skin care. Discussed progress towards goals.    Person(s) Educated Patient    Methods Explanation;Demonstration    Comprehension Verbalized understanding;Returned demonstration;Need further instruction  OT Long Term Goals - 07/01/20 1222      OT LONG TERM GOAL #1   Title Pt will be able to apply LLE multi-layer, short stretch compression wraps daily with maximum caregiver assistance, using correct gradient techniques independently to return affected limb/s, as closely as possible, to premorbid size and shape, to limit infection risk, and to improve safe functional mobility and ADLs performance.    Baseline dependent    Time 4    Period Days    Status Achieved   Pt has no assistance at SNF for compression wrapping. Pt launders and rolls bandages independently. Pt still requires ongoing verbal cues to lift leg and reposition wheil assistant waraps. 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     OT LONG TERM GOAL #2   Title Pt will be able to verbalize signs and symptoms of cellulitis infection and identify 4 common lymphedema precautions using printed resource for reference (modified independence) to limit LE progression over time.    Baseline Max A    Time 4    Period Days    Status Achieved      OT LONG TERM GOAL #3   Title Pt will sustain than 85% compliance with all daily LE self-care home program components throughout Intensive Phase CDT, including impeccable skin care, lymphatic pumping therex,  compression wraps and simple self MLD, to ensure optimal limb volume reduction, to limit infection risk and to  limit LE progression.    Baseline dependent    Time 12    Period Weeks    Status Achieved   Pt 85% compliant. Staff at SNF not always available to support 85% compliance     OT LONG TERM GOAL #4   Title Pt to achieve at least 10% LLE limb volume reduction during Intensive Phase CDT with Max CG assistance for wrapping, to improve functional performance of basic and instrumental ADLs, and to limit LE progression.   to date LLE reductions: A-D= 15.7%, E-G= 29.63%, A-G= 22.9%. RLE: A-D: 6.7%, E-G= 15.7%, A-G= 11.2%   Baseline dependent    Time 12    Period Weeks    Status Achieved      OT LONG TERM GOAL #5   Title During self-management phase of CDT Pt will retain limb volume reductions achieved during Intensive Phase CDT with no more than 3% volume increase using all LE self care components  to limit LE progression and further functional decline.    Baseline dependent    Time 6    Period Months    Status On-going                 Plan - 07/12/20 0900    Clinical Impression Statement Pt tolerated manual therapy to LLE/LLQ today , includng MLD, skin care and compression, without increased pain. New custom flat knitr compression knee high for RLE fits well and is comfortable. Staff at [facility] donned it well this morning. Pt in agreement with plan to decrease OT rx frequency from 3 x to 2 x weekly after Christmas to start transition to management pahse  of CDT to ensure Pt has no additional falls, and to ensure staff continue to assist Pt with self management routines, including donning and doffing compression garments and HOS devices daily, assist with accessing Flexitouch sequential device, and daily skin care. Without ongoing daily management infection risk will increase, clinical gains will be lost and Pt will experience further functional decline.    Occupational performance deficits (Please  refer to evaluation for details): ADL's;IADL's;Rest and Sleep;Work;Leisure;Social Participation     Rehab Potential Good    Clinical Decision Making Multiple treatment options, significant modification of task necessary    Comorbidities Affecting Occupational Performance: Presence of comorbidities impacting occupational performance    Modification or Assistance to Complete Evaluation  Min-Moderate modification of tasks or assist with assess necessary to complete eval    OT Treatment/Interventions Self-care/ADL training;Therapeutic exercise;Functional Mobility Training;Manual Therapy;Energy conservation;Manual lymph drainage;Therapeutic activities;Coping strategies training;DME and/or AE instruction;Compression bandaging;Patient/family education    Plan Complete Deconhgestive Therapy (CDT) Intensive and Self -Management phases, consisting of Manual lymphatic Drainage (MLD), skin care, ther ex and compression bandaging    OT Home Exercise Plan lymphatic pumping therex 2-3 x q day, in sequence, 10 reps bilaterally    Recommended Other Services Daytime: 2 Pr. [employer] EVAREX custom, ccl 2, A-D, 2.5 cm SB at oblique top edge, open toe, t heel.  HOS: Jobst RELAX custom ccl 2 A-D, OT w. posterior zipper           Patient will benefit from skilled therapeutic intervention in order to improve the following deficits and impairments:           Visit Diagnosis: Lymphedema, not elsewhere classified    Problem List Patient Active Problem List   Diagnosis Date Noted  . Iron deficiency 08/06/2018  . History of total left knee replacement (TKR) 07/18/2018  . Rectal bleeding 07/05/2018  . Hemorrhoids 07/05/2018  . Anal itching 07/05/2018  . Rotator cuff arthropathy of right shoulder   . Shoulder arthritis 06/15/2017  . Status post revision of total knee replacement, left 04/29/2017  . Polyethylene liner wear following left total knee arthroplasty requiring isolated polyethylene liner exchange ([place]) 03/02/2017  . 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 Unstable angina  ([place]) 05/21/2016  . Chronic diastolic heart failure ([place]) 03/29/2016  . Normal coronary arteries 2009 01/14/2015  . Obesity-BMI 40 01/14/2015  . Restless leg 01/14/2015  . Chest pain 01/14/2015  . Back pain 01/14/2015  . Renal insufficiency 01/14/2015  . Dementia- mild memory issues 01/14/2015  . Occult blood positive stool 12/14/2014  . Anemia 12/14/2014  . Family history of colon cancer 12/14/2014  . Change in bowel habits 12/14/2014  . GERD (gastroesophageal reflux disease) 12/14/2014  . Dyspnea 05/17/2012  . Lymphedema 05/17/2012  . Weight gain 05/17/2012  . HTN (hypertension) 05/17/2012    Andrey Spearman, MS, OTR/L, [facility] 07/12/20 9:36 AM  [place] MAIN [facility] SERVICES [address] [place], [place], [address] Phone: [phone]   Fax:  [phone]  Name: [name] MRN: [id number] Date of Birth: 02/08/1946

## 2020-07-15 ENCOUNTER — Other Ambulatory Visit: Payer: Self-pay

## 2020-07-15 ENCOUNTER — Ambulatory Visit: Payer: Medicare Other | Admitting: Occupational Therapy

## 2020-07-15 DIAGNOSIS — I890 Lymphedema, not elsewhere classified: Secondary | ICD-10-CM | POA: Diagnosis not present

## 2020-07-15 NOTE — Therapy (Signed)
[place] MAIN [facility] SERVICES [address] [place], [place], [address] Phone: [phone]   Fax:  [phone]  Occupational Therapy Treatment  Patient Details  Name: [name] MRN: [id number] Date of Birth: [birth year] Referring Provider (OT): Juluis Pitch, MD   Encounter Date: 07/15/2020   OT End of Session - 07/15/20 1329    Visit Number 70    Number of Visits 72    Date for OT Re-Evaluation 08/01/20    OT Start Time 1020    OT Stop Time 1100    OT Time Calculation (min) 40 min    Activity Tolerance Patient tolerated treatment well;No increased pain;Patient limited by pain   Chronic back pain worse since sliding off of bed when matress slid on frame. Denies leg pain   Behavior During Therapy [facility] for tasks assessed/performed   sleepiness throughout session          Past Medical History:  Diagnosis Date  . Anemia   . Anxiety   . Arthritis    "hands, feet" (03/03/2017)  . Brain lesion    2 types  . Cataract   . Cervical spondylosis with myelopathy   . Chest pain    Normal cardiac cath 5/09  . Chronic lower back pain   . CKD (chronic kidney disease), stage III ([place])    [name]  . Congestive heart failure (CHF) ([place])    has a Cardiomems implant   . Constipation   . Dementia ([place])   . Depression   . Dumping syndrome   . Dyspnea    with activity  . Edema   . Fatigue   . Fibromyalgia   . GERD (gastroesophageal reflux disease)   . Headache    "w/high CBG" (03/03/2017)  . History of echocardiogram    a. Echo 03/20/16 (done at [facility] in [place], [place]):  mild LVH, EF 55%, normal diastolic function, mild LAE, MAC, RVSP 25 mmHg  . Hyperlipidemia   . Hypertension   . Hypothyroidism   . Lumbar spondylosis   . Lymphedema    seeing specialist for this  . Migraine    "mostly stopped when I changed my diet" (03/03/2017)  . OSA on CPAP   . Pneumonia X 1  . PONV (postoperative nausea and vomiting)    . Restless leg syndrome   . Rheumatoid arthritis ([place])   . Stroke [facility])    TIAs "mini strokes"- unsure of last TIA - pt and daughter deny this  . Syncope   . Tremors of nervous system   . Type II diabetes mellitus ([place])     Past Surgical History:  Procedure Laterality Date  . APPENDECTOMY  1966  . BACK SURGERY    . CARDIAC CATHETERIZATION N/A 05/25/2016   Procedure: Right/Left Heart Cath and Coronary Angiography;  Surgeon: Larey Dresser, MD;  Location: [place] [facility];  Service: Cardiovascular;  Laterality: N/A;  . CATARACT EXTRACTION W/ INTRAOCULAR LENS  IMPLANT, BILATERAL Bilateral   . COLONOSCOPY    . CORONARY ANGIOGRAM  2009   Normal coronaries  . EXCISION/RELEASE BURSA HIP Bilateral   . I & D KNEE WITH POLY EXCHANGE Left 03/02/2017   Procedure: Left knee Revision of poly liner;  Surgeon: Mcarthur Rossetti, MD;  Location: [place];  Service: Orthopedics;  Laterality: Left;  . JOINT REPLACEMENT    . KNEE ARTHROSCOPY Bilateral   . LAPAROSCOPIC CHOLECYSTECTOMY  2015  . LMF  2017  CardiMEMS HF implant for CHF (measures amount of fluid in the heart)  . LUMBAR DISC SURGERY    . LUMBAR LAMINECTOMY/DECOMPRESSION MICRODISCECTOMY Left 12/2005   L2-3 laminectomy and diskectomy/notes 01/06/2011  . POSTERIOR LUMBAR FUSION  04/2006   [name] 01/06/2011; "put cages in"  . REVERSE SHOULDER ARTHROPLASTY Left 06/15/2017   Procedure: REVERSE LEFT SHOULDER ARTHROPLASTY;  Surgeon: Meredith Pel, MD;  Location: [place];  Service: Orthopedics;  Laterality: Left;  . REVERSE SHOULDER ARTHROPLASTY Right 02/01/2018  . REVERSE SHOULDER ARTHROPLASTY Right 02/01/2018   Procedure: RIGHT REVERSE SHOULDER ARTHROPLASTY;  Surgeon: Meredith Pel, MD;  Location: [place];  Service: Orthopedics;  Laterality: Right;  . REVISION TOTAL KNEE ARTHROPLASTY Left 03/02/2017   poly liner/notes 03/02/2017  . RIGHT HEART CATH N/A 11/13/2016   Procedure: Right Heart Cath with Cardiomems;  Surgeon: Larey Dresser, MD;  Location: [place] [facility];  Service: Cardiovascular;  Laterality: N/A;  . SHOULDER ARTHROSCOPY WITH ROTATOR CUFF REPAIR Right   . SHOULDER OPEN ROTATOR CUFF REPAIR Left 01/2011   [name] 01/29/2011  . TOTAL ABDOMINAL HYSTERECTOMY    . TOTAL KNEE ARTHROPLASTY Left   . TOTAL KNEE ARTHROPLASTY Right 11/18/2012   Procedure: TOTAL KNEE ARTHROPLASTY;  Surgeon: Yvette Rack., MD;  Location: [place];  Service: Orthopedics;  Laterality: Right;  . TUBAL LIGATION    . UPPER GASTROINTESTINAL ENDOSCOPY    . UPPER GI ENDOSCOPY      There were no vitals filed for this visit.   Subjective Assessment - 07/15/20 1033    Subjective  [name] presents to OT for Rx visit 66/72 to address BLE lipo-lymphedema (I89.0). Pt presents wearng BLE custom compression stockings. Pt reports, "7/10 in my r knee. My legs are more swollen today. I was up on them a lot yesterday."    Pertinent History chronic BLE leg swelling and associated pain ~ 40 years; hx herniated intervertebral discL TKA; L total knee revision; HTN, Obesity. dementia; restless leg; CHF; unstable anina; R RC arthroplasty, OA, cervical spondylosis with myelopathy, chronic lower back pain, CKD (Stage?); Fibromyalgia; Hypothyroidism; OSA ( cPap?); hx pneumonia; RA, Type II Diabetes. S/P cardiac cathg, B THA, Lumbar laminectomy, posterior lumbar fusion, B shoulder arthroplasty; total abdominal hysterectomy    Limitations chronic leg swelling and associated pain, difficulty walking, impaired dynamic balance, muscle weakness,    Repetition Increases Symptoms    Special Tests + STEMMER sign base of toes bilaterally                        OT Treatments/Exercises (OP) - 07/15/20 0001      ADLs   ADL Education Given Yes      Manual Therapy   Manual Therapy Edema management    Manual Lymphatic Drainage (MLD) LLE MLD as established. fibrosis techniqes to distal leg and ankle fat pads    Compression Bandaging Max A to don and doff L  custom stocking                  OT Education - 07/15/20 1329    Education Details Continued skilled Pt/caregiver education  And LE ADL training throughout visit for lymphedema self care/ home program, including compression wrapping, compression garment and device wear/care, lymphatic pumping ther ex, simple self-MLD, and skin care. Discussed progress towards goals.    Person(s) Educated Patient    Methods Explanation;Demonstration    Comprehension Verbalized understanding;Returned demonstration;Need further instruction  OT Long Term Goals - 07/01/20 1222      OT LONG TERM GOAL #1   Title Pt will be able to apply LLE multi-layer, short stretch compression wraps daily with maximum caregiver assistance, using correct gradient techniques independently to return affected limb/s, as closely as possible, to premorbid size and shape, to limit infection risk, and to improve safe functional mobility and ADLs performance.    Baseline dependent    Time 4    Period Days    Status Achieved   Pt has no assistance at SNF for compression wrapping. Pt launders and rolls bandages independently. Pt still requires ongoing verbal cues to lift leg and reposition wheil assistant waraps. 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     OT LONG TERM GOAL #2   Title Pt will be able to verbalize signs and symptoms of cellulitis infection and identify 4 common lymphedema precautions using printed resource for reference (modified independence) to limit LE progression over time.    Baseline Max A    Time 4    Period Days    Status Achieved      OT LONG TERM GOAL #3   Title Pt will sustain than 85% compliance with all daily LE self-care home program components throughout Intensive Phase CDT, including impeccable skin care, lymphatic pumping therex,  compression wraps and simple self MLD, to ensure optimal limb volume reduction, to limit infection risk and to limit LE progression.    Baseline  dependent    Time 12    Period Weeks    Status Achieved   Pt 85% compliant. Staff at SNF not always available to support 85% compliance     OT LONG TERM GOAL #4   Title Pt to achieve at least 10% LLE limb volume reduction during Intensive Phase CDT with Max CG assistance for wrapping, to improve functional performance of basic and instrumental ADLs, and to limit LE progression.   to date LLE reductions: A-D= 15.7%, E-G= 29.63%, A-G= 22.9%. RLE: A-D: 6.7%, E-G= 15.7%, A-G= 11.2%   Baseline dependent    Time 12    Period Weeks    Status Achieved      OT LONG TERM GOAL #5   Title During self-management phase of CDT Pt will retain limb volume reductions achieved during Intensive Phase CDT with no more than 3% volume increase using all LE self care components  to limit LE progression and further functional decline.    Baseline dependent    Time 6    Period Months    Status On-going                 Plan - 07/15/20 1329    Clinical Impression Statement Emphasis of visit on MLD , skin care and compression to LLE. Session abbreviated due to time constraints today. BLE are more swollen than usual today. Suspect systemic fluid retention is most likely agrivating factor due to bioateral, essentially symmetrical  presentation . Fluid retention fluctuations seem to have become more frequent in past few weeks. Will continue to monitor trend. Pt tolerated all aspects of CDT to LLE today. No notable change in limb volume appreciated after Rx. Pt in agreement with plan to decrease OT to 2 x weekly since custom garments are both fitted now. OT will follow along as we transition to self management phase of CDT to ensure optimal and consistent support at SNF is achieved to limit cellulitis recurrence and loss of clinical achievements made thus far.  Patient will benefit from skilled therapeutic intervention in order to improve the following deficits and impairments:           Visit  Diagnosis: Lymphedema, not elsewhere classified    Problem List Patient Active Problem List   Diagnosis Date Noted  . Iron deficiency 08/06/2018  . History of total left knee replacement (TKR) 07/18/2018  . Rectal bleeding 07/05/2018  . Hemorrhoids 07/05/2018  . Anal itching 07/05/2018  . Rotator cuff arthropathy of right shoulder   . Shoulder arthritis 06/15/2017  . Status post revision of total knee replacement, left 04/29/2017  . Polyethylene liner wear following left total knee arthroplasty requiring isolated polyethylene liner exchange ([place]) 03/02/2017  . 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 Unstable angina ([place]) 05/21/2016  . Chronic diastolic heart failure ([place]) 03/29/2016  . Normal coronary arteries 2009 01/14/2015  . Obesity-BMI 40 01/14/2015  . Restless leg 01/14/2015  . Chest pain 01/14/2015  . Back pain 01/14/2015  . Renal insufficiency 01/14/2015  . Dementia- mild memory issues 01/14/2015  . Occult blood positive stool 12/14/2014  . Anemia 12/14/2014  . Family history of colon cancer 12/14/2014  . Change in bowel habits 12/14/2014  . GERD (gastroesophageal reflux disease) 12/14/2014  . Dyspnea 05/17/2012  . Lymphedema 05/17/2012  . Weight gain 05/17/2012  . HTN (hypertension) 05/17/2012    Andrey Spearman, MS, OTR/L, [facility] 07/15/20 1:36 PM  [place] MAIN [facility] SERVICES [address] [place], [place], [address] Phone: [phone]   Fax:  [phone]  Name: [name] MRN: [id number] Date of Birth: Jan 05, 1946

## 2020-07-22 ENCOUNTER — Other Ambulatory Visit: Payer: Self-pay

## 2020-07-22 ENCOUNTER — Ambulatory Visit: Payer: Medicare Other | Admitting: Occupational Therapy

## 2020-07-22 DIAGNOSIS — I890 Lymphedema, not elsewhere classified: Secondary | ICD-10-CM

## 2020-07-22 NOTE — Therapy (Signed)
[place] MAIN [facility] SERVICES [address] [place], [place], [address] Phone: [phone]   Fax:  [phone]  Occupational Therapy Treatment  Patient Details  Name: [name] MRN: [id number] Date of Birth: December 02, 1945 Referring Provider (OT): [name] Pitch, MD   Encounter Date: 07/22/2020   OT End of Session - 07/22/20 1021    Visit Number 24    Number of Visits 72    Date for OT Re-Evaluation 08/01/20    OT Start Time 1005    OT Stop Time 1110    OT Time Calculation (min) 65 min    Activity Tolerance Patient tolerated treatment well;No increased pain;Patient limited by pain   Chronic back pain worse since sliding off of bed when matress slid on frame. Denies leg pain   Behavior During Therapy [facility] for tasks assessed/performed   sleepiness throughout session          Past Medical History:  Diagnosis Date  . Anemia   . Anxiety   . Arthritis    "hands, feet" (03/03/2017)  . Brain lesion    2 types  . Cataract   . Cervical spondylosis with myelopathy   . Chest pain    Normal cardiac cath 5/09  . Chronic lower back pain   . CKD (chronic kidney disease), stage III ([place])    [name]  . Congestive heart failure (CHF) ([place])    has a Cardiomems implant   . Constipation   . Dementia ([place])   . Depression   . Dumping syndrome   . Dyspnea    with activity  . Edema   . Fatigue   . Fibromyalgia   . GERD (gastroesophageal reflux disease)   . Headache    "w/high CBG" (03/03/2017)  . History of echocardiogram    a. Echo 03/20/16 (done at [facility] in [place], [place]):  mild LVH, EF 38%, normal diastolic function, mild LAE, MAC, RVSP 25 mmHg  . Hyperlipidemia   . Hypertension   . Hypothyroidism   . Lumbar spondylosis   . Lymphedema    seeing specialist for this  . Migraine    "mostly stopped when I changed my diet" (03/03/2017)  . OSA on CPAP   . Pneumonia X 1  . PONV (postoperative nausea and vomiting)    . Restless leg syndrome   . Rheumatoid arthritis ([place])   . Stroke [facility])    TIAs "mini strokes"- unsure of last TIA - pt and daughter deny this  . Syncope   . Tremors of nervous system   . Type II diabetes mellitus ([place])     Past Surgical History:  Procedure Laterality Date  . APPENDECTOMY  1966  . BACK SURGERY    . CARDIAC CATHETERIZATION N/A 05/25/2016   Procedure: Right/Left Heart Cath and Coronary Angiography;  Surgeon: [name] Dresser, MD;  Location: [place] CV LAB;  Service: Cardiovascular;  Laterality: N/A;  . CATARACT EXTRACTION W/ INTRAOCULAR LENS  IMPLANT, BILATERAL Bilateral   . COLONOSCOPY    . CORONARY ANGIOGRAM  2009   Normal coronaries  . EXCISION/RELEASE BURSA HIP Bilateral   . I & D KNEE WITH POLY EXCHANGE Left 03/02/2017   Procedure: Left knee Revision of poly liner;  Surgeon: [name] Rossetti, MD;  Location: [place];  Service: Orthopedics;  Laterality: Left;  . JOINT REPLACEMENT    . KNEE ARTHROSCOPY Bilateral   . LAPAROSCOPIC CHOLECYSTECTOMY  2015  . LMF  2017  CardiMEMS HF implant for CHF (measures amount of fluid in the heart)  . LUMBAR DISC SURGERY    . LUMBAR LAMINECTOMY/DECOMPRESSION MICRODISCECTOMY Left 12/2005   L2-3 laminectomy and diskectomy/notes 01/06/2011  . POSTERIOR LUMBAR FUSION  04/2006   [name] 01/06/2011; "put cages in"  . REVERSE SHOULDER ARTHROPLASTY Left 06/15/2017   Procedure: REVERSE LEFT SHOULDER ARTHROPLASTY;  Surgeon: [name] Pel, MD;  Location: [place];  Service: Orthopedics;  Laterality: Left;  . REVERSE SHOULDER ARTHROPLASTY Right 02/01/2018  . REVERSE SHOULDER ARTHROPLASTY Right 02/01/2018   Procedure: RIGHT REVERSE SHOULDER ARTHROPLASTY;  Surgeon: [name] Pel, MD;  Location: [place];  Service: Orthopedics;  Laterality: Right;  . REVISION TOTAL KNEE ARTHROPLASTY Left 03/02/2017   poly liner/notes 03/02/2017  . RIGHT HEART CATH N/A 11/13/2016   Procedure: Right Heart Cath with Cardiomems;  Surgeon: [name] Dresser, MD;  Location: [place] CV LAB;  Service: Cardiovascular;  Laterality: N/A;  . SHOULDER ARTHROSCOPY WITH ROTATOR CUFF REPAIR Right   . SHOULDER OPEN ROTATOR CUFF REPAIR Left 01/2011   [name] 01/29/2011  . TOTAL ABDOMINAL HYSTERECTOMY    . TOTAL KNEE ARTHROPLASTY Left   . TOTAL KNEE ARTHROPLASTY Right 11/18/2012   Procedure: TOTAL KNEE ARTHROPLASTY;  Surgeon: [name]., MD;  Location: [place];  Service: Orthopedics;  Laterality: Right;  . TUBAL LIGATION    . UPPER GASTROINTESTINAL ENDOSCOPY    . UPPER GI ENDOSCOPY      There were no vitals filed for this visit.   Subjective Assessment - 07/22/20 1013    Subjective  Mrs Seat presents to OT for Rx visit 67/72 to address BLE lipo-lymphedema (I89.0). Pt presents wearng BLE custom compression stockings. Pt reports R leg pain 6/10  this morning. Pt reports increased leg swelling over the last couple of days making donning comrpession stockings more challenging for staff. Pt admits she did not take diuretic as perscribed because she was at a relative's home for the [place] holiday, and frequent urination and urgency would be very challenging away from the SNF.    Pertinent History chronic BLE leg swelling and associated pain ~ 40 years; hx herniated intervertebral discL TKA; L total knee revision; HTN, Obesity. dementia; restless leg; CHF; unstable anina; R RC arthroplasty, OA, cervical spondylosis with myelopathy, chronic lower back pain, CKD (Stage?); Fibromyalgia; Hypothyroidism; OSA ( cPap?); hx pneumonia; RA, Type II Diabetes. S/P cardiac cathg, B THA, Lumbar laminectomy, posterior lumbar fusion, B shoulder arthroplasty; total abdominal hysterectomy    Limitations chronic leg swelling and associated pain, difficulty walking, impaired dynamic balance, muscle weakness,    Repetition Increases Symptoms    Special Tests + STEMMER sign base of toes bilaterally                        OT Treatments/Exercises (OP) - 07/22/20  0001      ADLs   ADL Education Given Yes      Manual Therapy   Manual Therapy Edema management;Manual Lymphatic Drainage (MLD);Compression Bandaging    Manual Lymphatic Drainage (MLD) RLE MLD as established. fibrosis techniqes to distal leg and ankle fat pads    Compression Bandaging Max A to don and doff custom stocking                  OT Education - 07/22/20 1021    Education Details Pt edu re  impact of CHF and  systemic "fluid overload" on lymphatic function and limb swelling. Pt strongly  encouraged to take diuretic medication as prescribed. Discussed strategies for modifying timing medication, rather than not taking it all together, when she knows an occasion is coming up when frequent urination wiol pose hardship    Person(s) Educated Patient    Methods Explanation;Demonstration    Comprehension Verbalized understanding;Returned demonstration;Need further instruction               OT Long Term Goals - 07/01/20 1222      OT LONG TERM GOAL #1   Title Pt will be able to apply LLE multi-layer, short stretch compression wraps daily with maximum caregiver assistance, using correct gradient techniques independently to return affected limb/s, as closely as possible, to premorbid size and shape, to limit infection risk, and to improve safe functional mobility and ADLs performance.    Baseline dependent    Time 4    Period Days    Status Achieved   Pt has no assistance at SNF for compression wrapping. Pt launders and rolls bandages independently. Pt still requires ongoing verbal cues to lift leg and reposition wheil assistant waraps. 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     OT LONG TERM GOAL #2   Title Pt will be able to verbalize signs and symptoms of cellulitis infection and identify 4 common lymphedema precautions using printed resource for reference (modified independence) to limit LE progression over time.    Baseline Max A    Time 4    Period Days    Status  Achieved      OT LONG TERM GOAL #3   Title Pt will sustain than 85% compliance with all daily LE self-care home program components throughout Intensive Phase CDT, including impeccable skin care, lymphatic pumping therex,  compression wraps and simple self MLD, to ensure optimal limb volume reduction, to limit infection risk and to limit LE progression.    Baseline dependent    Time 12    Period Weeks    Status Achieved   Pt 85% compliant. Staff at SNF not always available to support 85% compliance     OT LONG TERM GOAL #4   Title Pt to achieve at least 10% LLE limb volume reduction during Intensive Phase CDT with Max CG assistance for wrapping, to improve functional performance of basic and instrumental ADLs, and to limit LE progression.   to date LLE reductions: A-D= 15.7%, E-G= 29.63%, A-G= 22.9%. RLE: A-D: 6.7%, E-G= 15.7%, A-G= 11.2%   Baseline dependent    Time 12    Period Weeks    Status Achieved      OT LONG TERM GOAL #5   Title During self-management phase of CDT Pt will retain limb volume reductions achieved during Intensive Phase CDT with no more than 3% volume increase using all LE self care components  to limit LE progression and further functional decline.    Baseline dependent    Time 6    Period Months    Status On-going                 Plan - 07/22/20 1133    Clinical Impression Statement BLE presenting with increased swelling today most likely due to skipping diuretic dose/s over holiday weekend while visiting family homes, and due to increased dependent positioning  .We discussed systemic fluid retention as a symptom of kidney, liver and /or heart disease and how it impacts the lymphatic system. Discussed the importance of leg elevation and taking diuretic as prescribed. Discussed strategy of modifying timing  of diuretic doses when attending family gatherings vs not taking medication at all. Provided MLD , skin care and comression as established. Advised Pt to  monitor legs closely due to increased risk of infection and noticable increased skin temperature (inflammation) , R>L,  below the knees this morning.           Patient will benefit from skilled therapeutic intervention in order to improve the following deficits and impairments:           Visit Diagnosis: Lymphedema, not elsewhere classified    Problem List Patient Active Problem List   Diagnosis Date Noted  . Iron deficiency 08/06/2018  . History of total left knee replacement (TKR) 07/18/2018  . Rectal bleeding 07/05/2018  . Hemorrhoids 07/05/2018  . Anal itching 07/05/2018  . Rotator cuff arthropathy of right shoulder   . Shoulder arthritis 06/15/2017  . Status post revision of total knee replacement, left 04/29/2017  . Polyethylene liner wear following left total knee arthroplasty requiring isolated polyethylene liner exchange ([place]) 03/02/2017  . 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 Unstable angina ([place]) 05/21/2016  . Chronic diastolic heart failure ([place]) 03/29/2016  . Normal coronary arteries 2009 01/14/2015  . Obesity-BMI 40 01/14/2015  . Restless leg 01/14/2015  . Chest pain 01/14/2015  . Back pain 01/14/2015  . Renal insufficiency 01/14/2015  . Dementia- mild memory issues 01/14/2015  . Occult blood positive stool 12/14/2014  . Anemia 12/14/2014  . Family history of colon cancer 12/14/2014  . Change in bowel habits 12/14/2014  . GERD (gastroesophageal reflux disease) 12/14/2014  . Dyspnea 05/17/2012  . Lymphedema 05/17/2012  . Weight gain 05/17/2012  . HTN (hypertension) 05/17/2012    Andrey Spearman, MS, OTR/L, [facility] 07/22/20 11:42 AM   [place] MAIN [facility] SERVICES [address] [place], [place], [address] Phone: [phone]   Fax:  [phone]  Name: [name] MRN: [id number] Date of Birth: [birth year]

## 2020-07-24 ENCOUNTER — Ambulatory Visit: Payer: Medicare Other | Admitting: Occupational Therapy

## 2020-07-26 ENCOUNTER — Ambulatory Visit: Payer: Medicare Other | Admitting: Occupational Therapy

## 2020-07-26 ENCOUNTER — Ambulatory Visit: Payer: Medicare Other | Attending: Family Medicine | Admitting: Occupational Therapy

## 2020-07-26 ENCOUNTER — Other Ambulatory Visit: Payer: Self-pay

## 2020-07-26 DIAGNOSIS — I890 Lymphedema, not elsewhere classified: Secondary | ICD-10-CM | POA: Diagnosis not present

## 2020-07-26 NOTE — Therapy (Signed)
[place] MAIN [facility] SERVICES [address] [place], [place], [address] Phone: [phone]   Fax:  [phone]  Occupational Therapy Treatment  Patient Details  Name: [name] MRN: [id number] Date of Birth: June 14, 1946 Referring Provider (OT): Juluis Pitch, MD   Encounter Date: 07/26/2020   OT End of Session - 07/26/20 0901    Visit Number 20    Number of Visits 72    Date for OT Re-Evaluation 08/01/20    OT Start Time 0810    OT Stop Time 0905    OT Time Calculation (min) 55 min    Equipment Utilized During Treatment friction gloves and tyvek slipper    Activity Tolerance Patient tolerated treatment well;No increased pain;Patient limited by pain   Chronic back pain worse since sliding off of bed when matress slid on frame. Denies leg pain   Behavior During Therapy [facility] for tasks assessed/performed   sleepiness throughout session          Past Medical History:  Diagnosis Date  . Anemia   . Anxiety   . Arthritis    "hands, feet" (03/03/2017)  . Brain lesion    2 types  . Cataract   . Cervical spondylosis with myelopathy   . Chest pain    Normal cardiac cath 5/09  . Chronic lower back pain   . CKD (chronic kidney disease), stage III ([place])    [name]  . Congestive heart failure (CHF) ([place])    has a Cardiomems implant   . Constipation   . Dementia ([place])   . Depression   . Dumping syndrome   . Dyspnea    with activity  . Edema   . Fatigue   . Fibromyalgia   . GERD (gastroesophageal reflux disease)   . Headache    "w/high CBG" (03/03/2017)  . History of echocardiogram    a. Echo 03/20/16 (done at [facility] in [place], [place]):  mild LVH, EF 69%, normal diastolic function, mild LAE, MAC, RVSP 25 mmHg  . Hyperlipidemia   . Hypertension   . Hypothyroidism   . Lumbar spondylosis   . Lymphedema    seeing specialist for this  . Migraine    "mostly stopped when I changed my diet" (03/03/2017)  . OSA  on CPAP   . Pneumonia X 1  . PONV (postoperative nausea and vomiting)   . Restless leg syndrome   . Rheumatoid arthritis ([place])   . Stroke [facility])    TIAs "mini strokes"- unsure of last TIA - pt and daughter deny this  . Syncope   . Tremors of nervous system   . Type II diabetes mellitus ([place])     Past Surgical History:  Procedure Laterality Date  . APPENDECTOMY  1966  . BACK SURGERY    . CARDIAC CATHETERIZATION N/A 05/25/2016   Procedure: Right/Left Heart Cath and Coronary Angiography;  Surgeon: Larey Dresser, MD;  Location: [place] CV LAB;  Service: Cardiovascular;  Laterality: N/A;  . CATARACT EXTRACTION W/ INTRAOCULAR LENS  IMPLANT, BILATERAL Bilateral   . COLONOSCOPY    . CORONARY ANGIOGRAM  2009   Normal coronaries  . EXCISION/RELEASE BURSA HIP Bilateral   . I & D KNEE WITH POLY EXCHANGE Left 03/02/2017   Procedure: Left knee Revision of poly liner;  Surgeon: Mcarthur Rossetti, MD;  Location: [place];  Service: Orthopedics;  Laterality: Left;  . JOINT REPLACEMENT    . KNEE ARTHROSCOPY Bilateral   .  LAPAROSCOPIC CHOLECYSTECTOMY  2015  . LMF  2017   CardiMEMS HF implant for CHF (measures amount of fluid in the heart)  . LUMBAR DISC SURGERY    . LUMBAR LAMINECTOMY/DECOMPRESSION MICRODISCECTOMY Left 12/2005   L2-3 laminectomy and diskectomy/notes 01/06/2011  . POSTERIOR LUMBAR FUSION  04/2006   [name] 01/06/2011; "put cages in"  . REVERSE SHOULDER ARTHROPLASTY Left 06/15/2017   Procedure: REVERSE LEFT SHOULDER ARTHROPLASTY;  Surgeon: Meredith Pel, MD;  Location: [place];  Service: Orthopedics;  Laterality: Left;  . REVERSE SHOULDER ARTHROPLASTY Right 02/01/2018  . REVERSE SHOULDER ARTHROPLASTY Right 02/01/2018   Procedure: RIGHT REVERSE SHOULDER ARTHROPLASTY;  Surgeon: Meredith Pel, MD;  Location: [place];  Service: Orthopedics;  Laterality: Right;  . REVISION TOTAL KNEE ARTHROPLASTY Left 03/02/2017   poly liner/notes 03/02/2017  . RIGHT HEART CATH N/A 11/13/2016    Procedure: Right Heart Cath with Cardiomems;  Surgeon: Larey Dresser, MD;  Location: [place] CV LAB;  Service: Cardiovascular;  Laterality: N/A;  . SHOULDER ARTHROSCOPY WITH ROTATOR CUFF REPAIR Right   . SHOULDER OPEN ROTATOR CUFF REPAIR Left 01/2011   [name] 01/29/2011  . TOTAL ABDOMINAL HYSTERECTOMY    . TOTAL KNEE ARTHROPLASTY Left   . TOTAL KNEE ARTHROPLASTY Right 11/18/2012   Procedure: TOTAL KNEE ARTHROPLASTY;  Surgeon: Yvette Rack., MD;  Location: [place];  Service: Orthopedics;  Laterality: Right;  . TUBAL LIGATION    . UPPER GASTROINTESTINAL ENDOSCOPY    . UPPER GI ENDOSCOPY      There were no vitals filed for this visit.   Subjective Assessment - 07/26/20 7673    Subjective  [name] presents to OT for Rx visit 68/72 to address BLE lipo-lymphedema (I89.0). Pt presents wearng BLE custom compression stockings. Pt reports BLE leg pain during HOS, in both thighs and below the knees. Pt reports "throbbing and cramping".    Pertinent History chronic BLE leg swelling and associated pain ~ 40 years; hx herniated intervertebral discL TKA; L total knee revision; HTN, Obesity. dementia; restless leg; CHF; unstable anina; R RC arthroplasty, OA, cervical spondylosis with myelopathy, chronic lower back pain, CKD (Stage?); Fibromyalgia; Hypothyroidism; OSA ( cPap?); hx pneumonia; RA, Type II Diabetes. S/P cardiac cathg, B THA, Lumbar laminectomy, posterior lumbar fusion, B shoulder arthroplasty; total abdominal hysterectomy    Limitations chronic leg swelling and associated pain, difficulty walking, impaired dynamic balance, muscle weakness,    Repetition Increases Symptoms    Special Tests + STEMMER sign base of toes bilaterally                        OT Treatments/Exercises (OP) - 07/26/20 0001      ADLs   ADL Education Given Yes      Manual Therapy   Manual Therapy Edema management;Manual Lymphatic Drainage (MLD);Compression Bandaging    Manual Lymphatic Drainage  (MLD) LLE MLD as established. fibrosis techniqes to distal leg and ankle fat pads    Compression Bandaging Max A to don and doff custom stocking                  OT Education - 07/26/20 0901    Education Details Pt edu re  impact of CHF and  systemic "fluid overload" on lymphatic function and limb swelling. Pt strongly encouraged to take diuretic medication as prescribed. Discussed strategies for modifying timing medication, rather than not taking it all together, when she knows an occasion is coming up when frequent urination  wiol pose hardship    Person(s) Educated Patient    Methods Explanation;Demonstration    Comprehension Verbalized understanding;Returned demonstration;Need further instruction               OT Long Term Goals - 07/01/20 1222      OT LONG TERM GOAL #1   Title Pt will be able to apply LLE multi-layer, short stretch compression wraps daily with maximum caregiver assistance, using correct gradient techniques independently to return affected limb/s, as closely as possible, to premorbid size and shape, to limit infection risk, and to improve safe functional mobility and ADLs performance.    Baseline dependent    Time 4    Period Days    Status Achieved   Pt has no assistance at SNF for compression wrapping. Pt launders and rolls bandages independently. Pt still requires ongoing verbal cues to lift leg and reposition wheil assistant waraps. 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     OT LONG TERM GOAL #2   Title Pt will be able to verbalize signs and symptoms of cellulitis infection and identify 4 common lymphedema precautions using printed resource for reference (modified independence) to limit LE progression over time.    Baseline Max A    Time 4    Period Days    Status Achieved      OT LONG TERM GOAL #3   Title Pt will sustain than 85% compliance with all daily LE self-care home program components throughout Intensive Phase CDT, including impeccable  skin care, lymphatic pumping therex,  compression wraps and simple self MLD, to ensure optimal limb volume reduction, to limit infection risk and to limit LE progression.    Baseline dependent    Time 12    Period Weeks    Status Achieved   Pt 85% compliant. Staff at SNF not always available to support 85% compliance     OT LONG TERM GOAL #4   Title Pt to achieve at least 10% LLE limb volume reduction during Intensive Phase CDT with Max CG assistance for wrapping, to improve functional performance of basic and instrumental ADLs, and to limit LE progression.   to date LLE reductions: A-D= 15.7%, E-G= 29.63%, A-G= 22.9%. RLE: A-D: 6.7%, E-G= 15.7%, A-G= 11.2%   Baseline dependent    Time 12    Period Weeks    Status Achieved      OT LONG TERM GOAL #5   Title During self-management phase of CDT Pt will retain limb volume reductions achieved during Intensive Phase CDT with no more than 3% volume increase using all LE self care components  to limit LE progression and further functional decline.    Baseline dependent    Time 6    Period Months    Status On-going                 Plan - 07/26/20 0902    Clinical Impression Statement Pt tolerated LLE MLD, skin care and compression theray without increased pain. Increased leg swelling noted bilaterally again today. Cont as per POC 2 x weekly for transition to management phase CDT.           Patient will benefit from skilled therapeutic intervention in order to improve the following deficits and impairments:           Visit Diagnosis: Lymphedema, not elsewhere classified    Problem List Patient Active Problem List   Diagnosis Date Noted  . Iron deficiency 08/06/2018  . History  of total left knee replacement (TKR) 07/18/2018  . Rectal bleeding 07/05/2018  . Hemorrhoids 07/05/2018  . Anal itching 07/05/2018  . Rotator cuff arthropathy of right shoulder   . Shoulder arthritis 06/15/2017  . Status post revision of total knee  replacement, left 04/29/2017  . Polyethylene liner wear following left total knee arthroplasty requiring isolated polyethylene liner exchange ([place]) 03/02/2017  . 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 Unstable angina ([place]) 05/21/2016  . Chronic diastolic heart failure ([place]) 03/29/2016  . Normal coronary arteries 2009 01/14/2015  . Obesity-BMI 40 01/14/2015  . Restless leg 01/14/2015  . Chest pain 01/14/2015  . Back pain 01/14/2015  . Renal insufficiency 01/14/2015  . Dementia- mild memory issues 01/14/2015  . Occult blood positive stool 12/14/2014  . Anemia 12/14/2014  . Family history of colon cancer 12/14/2014  . Change in bowel habits 12/14/2014  . GERD (gastroesophageal reflux disease) 12/14/2014  . Dyspnea 05/17/2012  . Lymphedema 05/17/2012  . Weight gain 05/17/2012  . HTN (hypertension) 05/17/2012    Andrey Spearman, MS, OTR/L, [facility] 07/26/20 9:04 AM   [place] MAIN [facility] SERVICES [address] [place], [place], [address] Phone: [phone]   Fax:  [phone]  Name: [name] MRN: [id number] Date of Birth: 09/15/1945

## 2020-07-29 ENCOUNTER — Ambulatory Visit: Payer: Medicare Other | Admitting: Occupational Therapy

## 2020-07-29 ENCOUNTER — Other Ambulatory Visit: Payer: Self-pay

## 2020-07-29 DIAGNOSIS — I890 Lymphedema, not elsewhere classified: Secondary | ICD-10-CM | POA: Diagnosis not present

## 2020-07-29 NOTE — Therapy (Signed)
[place] [facility] SERVICES [address] [place], [place], [address] Phone: [phone]   Fax:  [phone]  Occupational Therapy Treatment  Patient Details  Name: [name] MRN: [id number] Date of Birth: [birth year] Referring Provider (OT): Juluis Pitch, MD   Encounter Date: 07/29/2020   OT End of Session - 07/29/20 0916    Visit Number 39    Number of Visits 72    Date for OT Re-Evaluation 08/01/20    OT Start Time 0908    OT Stop Time 0958    OT Time Calculation (min) 50 min    Equipment Utilized During Treatment friction gloves and tyvek slipper    Activity Tolerance Patient tolerated treatment well;No increased pain;Patient limited by pain   Chronic back pain worse since sliding off of bed when matress slid on frame. Denies leg pain   Behavior During Therapy [facility] for tasks assessed/performed   sleepiness throughout session          Past Medical History:  Diagnosis Date  . Anemia   . Anxiety   . Arthritis    "hands, feet" (03/03/2017)  . Brain lesion    2 types  . Cataract   . Cervical spondylosis with myelopathy   . Chest pain    Normal cardiac cath 5/09  . Chronic lower back pain   . CKD (chronic kidney disease), stage III ([place])    [name]  . Congestive heart failure (CHF) ([place])    has a Cardiomems implant   . Constipation   . Dementia ([place])   . Depression   . Dumping syndrome   . Dyspnea    with activity  . Edema   . Fatigue   . Fibromyalgia   . GERD (gastroesophageal reflux disease)   . Headache    "w/high CBG" (03/03/2017)  . History of echocardiogram    a. Echo 03/20/16 (done at [facility] in [place], [place]):  mild LVH, EF 93%, normal diastolic function, mild LAE, MAC, RVSP 25 mmHg  . Hyperlipidemia   . Hypertension   . Hypothyroidism   . Lumbar spondylosis   . Lymphedema    seeing specialist for this  . Migraine    "mostly stopped when I changed my diet" (03/03/2017)  . OSA  on CPAP   . Pneumonia X 1  . PONV (postoperative nausea and vomiting)   . Restless leg syndrome   . Rheumatoid arthritis ([place])   . Stroke [facility])    TIAs "mini strokes"- unsure of last TIA - pt and daughter deny this  . Syncope   . Tremors of nervous system   . Type II diabetes mellitus ([place])     Past Surgical History:  Procedure Laterality Date  . APPENDECTOMY  1966  . BACK SURGERY    . CARDIAC CATHETERIZATION N/A 05/25/2016   Procedure: Right/Left Heart Cath and Coronary Angiography;  Surgeon: Larey Dresser, MD;  Location: [place] [facility];  Service: Cardiovascular;  Laterality: N/A;  . CATARACT EXTRACTION W/ INTRAOCULAR LENS  IMPLANT, BILATERAL Bilateral   . COLONOSCOPY    . CORONARY ANGIOGRAM  2009   Normal coronaries  . EXCISION/RELEASE BURSA HIP Bilateral   . I & D KNEE WITH POLY EXCHANGE Left 03/02/2017   Procedure: Left knee Revision of poly liner;  Surgeon: Mcarthur Rossetti, MD;  Location: [place];  Service: Orthopedics;  Laterality: Left;  . JOINT REPLACEMENT    . KNEE ARTHROSCOPY Bilateral   .  LAPAROSCOPIC CHOLECYSTECTOMY  2015  . LMF  2017   CardiMEMS HF implant for CHF (measures amount of fluid in the heart)  . LUMBAR DISC SURGERY    . LUMBAR LAMINECTOMY/DECOMPRESSION MICRODISCECTOMY Left 12/2005   L2-3 laminectomy and diskectomy/notes 01/06/2011  . POSTERIOR LUMBAR FUSION  04/2006   [name] 01/06/2011; "put cages in"  . REVERSE SHOULDER ARTHROPLASTY Left 06/15/2017   Procedure: REVERSE LEFT SHOULDER ARTHROPLASTY;  Surgeon: Meredith Pel, MD;  Location: [place];  Service: Orthopedics;  Laterality: Left;  . REVERSE SHOULDER ARTHROPLASTY Right 02/01/2018  . REVERSE SHOULDER ARTHROPLASTY Right 02/01/2018   Procedure: RIGHT REVERSE SHOULDER ARTHROPLASTY;  Surgeon: Meredith Pel, MD;  Location: [place];  Service: Orthopedics;  Laterality: Right;  . REVISION TOTAL KNEE ARTHROPLASTY Left 03/02/2017   poly liner/notes 03/02/2017  . RIGHT HEART CATH N/A 11/13/2016    Procedure: Right Heart Cath with Cardiomems;  Surgeon: Larey Dresser, MD;  Location: [place] [facility];  Service: Cardiovascular;  Laterality: N/A;  . SHOULDER ARTHROSCOPY WITH ROTATOR CUFF REPAIR Right   . SHOULDER OPEN ROTATOR CUFF REPAIR Left 01/2011   [name] 01/29/2011  . TOTAL ABDOMINAL HYSTERECTOMY    . TOTAL KNEE ARTHROPLASTY Left   . TOTAL KNEE ARTHROPLASTY Right 11/18/2012   Procedure: TOTAL KNEE ARTHROPLASTY;  Surgeon: Yvette Rack., MD;  Location: [place];  Service: Orthopedics;  Laterality: Right;  . TUBAL LIGATION    . UPPER GASTROINTESTINAL ENDOSCOPY    . UPPER GI ENDOSCOPY      There were no vitals filed for this visit.   Subjective Assessment - 07/29/20 0915    Subjective  Mrs Eisler presents to OT for Rx visit 69/72 to address BLE lipo-lymphedema (I89.0). Pt presents wearng BLE custom compression stockings. Pt reports increased LLE swelling all weekend. Pt requests this visit be abbreviated so she can attend a program at [facility] after this session.    Pertinent History chronic BLE leg swelling and associated pain ~ 40 years; hx herniated intervertebral discL TKA; L total knee revision; HTN, Obesity. dementia; restless leg; CHF; unstable anina; R RC arthroplasty, OA, cervical spondylosis with myelopathy, chronic lower back pain, CKD (Stage?); Fibromyalgia; Hypothyroidism; OSA ( cPap?); hx pneumonia; RA, Type II Diabetes. S/P cardiac cathg, B THA, Lumbar laminectomy, posterior lumbar fusion, B shoulder arthroplasty; total abdominal hysterectomy    Limitations chronic leg swelling and associated pain, difficulty walking, impaired dynamic balance, muscle weakness,    Repetition Increases Symptoms    Special Tests + STEMMER sign base of toes bilaterally                        OT Treatments/Exercises (OP) - 07/29/20 0001      ADLs   ADL Education Given Yes      Manual Therapy   Manual Therapy Edema management;Manual Lymphatic Drainage (MLD)    Manual Lymphatic  Drainage (MLD) LLE MLD as established. fibrosis techniqes to distal leg and ankle fat pads    Compression Bandaging Max A to don and doff custom stocking                  OT Education - 07/29/20 1114    Education Details Continued skilled Pt/caregiver education  And LE ADL training throughout visit for lymphedema self care/ home program, including compression wrapping, compression garment and device wear/care, lymphatic pumping ther ex, simple self-MLD, and skin care. Discussed progress towards goals.    Person(s) Educated Patient  Methods Explanation;Demonstration    Comprehension Verbalized understanding;Returned demonstration;Need further instruction               OT Long Term Goals - 07/01/20 1222      OT LONG TERM GOAL #1   Title Pt will be able to apply LLE multi-layer, short stretch compression wraps daily with maximum caregiver assistance, using correct gradient techniques independently to return affected limb/s, as closely as possible, to premorbid size and shape, to limit infection risk, and to improve safe functional mobility and ADLs performance.    Baseline dependent    Time 4    Period Days    Status Achieved   Pt has no assistance at SNF for compression wrapping. Pt launders and rolls bandages independently. Pt still requires ongoing verbal cues to lift leg and reposition wheil assistant waraps. 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     OT LONG TERM GOAL #2   Title Pt will be able to verbalize signs and symptoms of cellulitis infection and identify 4 common lymphedema precautions using printed resource for reference (modified independence) to limit LE progression over time.    Baseline Max A    Time 4    Period Days    Status Achieved      OT LONG TERM GOAL #3   Title Pt will sustain than 85% compliance with all daily LE self-care home program components throughout Intensive Phase CDT, including impeccable skin care, lymphatic pumping therex,   compression wraps and simple self MLD, to ensure optimal limb volume reduction, to limit infection risk and to limit LE progression.    Baseline dependent    Time 12    Period Weeks    Status Achieved   Pt 85% compliant. Staff at SNF not always available to support 85% compliance     OT LONG TERM GOAL #4   Title Pt to achieve at least 10% LLE limb volume reduction during Intensive Phase CDT with Max CG assistance for wrapping, to improve functional performance of basic and instrumental ADLs, and to limit LE progression.   to date LLE reductions: A-D= 15.7%, E-G= 29.63%, A-G= 22.9%. RLE: A-D: 6.7%, E-G= 15.7%, A-G= 11.2%   Baseline dependent    Time 12    Period Weeks    Status Achieved      OT LONG TERM GOAL #5   Title During self-management phase of CDT Pt will retain limb volume reductions achieved during Intensive Phase CDT with no more than 3% volume increase using all LE self care components  to limit LE progression and further functional decline.    Baseline dependent    Time 6    Period Months    Status On-going                 Plan - 07/29/20 5465    Clinical Impression Statement Pt tolerated LLE MLD, skin care and compression theray without increased pain. Increased LLE swelling observed again todat in full leg from ankle to thigh despite reported compliance with compression garments and devices and use of diuretic as directed daily since last  visit. Pt in agreement that ccl 2 flat knit garment may not  offer enough compression to control more involved LLE. We'll continue to monitor closely and consider ordering ccl 3 garment after the holidays if unilateral swelling persists. Garments may also be somewhat stretched out of shape if they are not laundered frequently, or if they are by hand intermittently. RLE appears well controlled  with existing garment.Cont as per POC.           Patient will benefit from skilled therapeutic intervention in order to improve the following  deficits and impairments:           Visit Diagnosis: Lymphedema, not elsewhere classified    Problem List Patient Active Problem List   Diagnosis Date Noted  . Iron deficiency 08/06/2018  . History of total left knee replacement (TKR) 07/18/2018  . Rectal bleeding 07/05/2018  . Hemorrhoids 07/05/2018  . Anal itching 07/05/2018  . Rotator cuff arthropathy of right shoulder   . Shoulder arthritis 06/15/2017  . Status post revision of total knee replacement, left 04/29/2017  . Polyethylene liner wear following left total knee arthroplasty requiring isolated polyethylene liner exchange ([place]) 03/02/2017  . 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 Unstable angina ([place]) 05/21/2016  . Chronic diastolic heart failure ([place]) 03/29/2016  . Normal coronary arteries 2009 01/14/2015  . Obesity-BMI 40 01/14/2015  . Restless leg 01/14/2015  . Chest pain 01/14/2015  . Back pain 01/14/2015  . Renal insufficiency 01/14/2015  . Dementia- mild memory issues 01/14/2015  . Occult blood positive stool 12/14/2014  . Anemia 12/14/2014  . Family history of colon cancer 12/14/2014  . Change in bowel habits 12/14/2014  . GERD (gastroesophageal reflux disease) 12/14/2014  . Dyspnea 05/17/2012  . Lymphedema 05/17/2012  . Weight gain 05/17/2012  . HTN (hypertension) 05/17/2012    Andrey Spearman, MS, OTR/L, [facility] 07/29/20 11:23 AM   [place] [facility] SERVICES [address] [place], [place], [address] Phone: [phone]   Fax:  [phone]  Name: [name] MRN: [id number] Date of Birth: 08/29/1945

## 2020-07-31 ENCOUNTER — Encounter: Payer: Medicare Other | Admitting: Occupational Therapy

## 2020-08-02 ENCOUNTER — Other Ambulatory Visit: Payer: Self-pay

## 2020-08-02 ENCOUNTER — Ambulatory Visit: Payer: Medicare Other | Admitting: Occupational Therapy

## 2020-08-02 DIAGNOSIS — I890 Lymphedema, not elsewhere classified: Secondary | ICD-10-CM

## 2020-08-02 NOTE — Therapy (Signed)
[place] MAIN [facility] SERVICES [address] [place], [place], [address] Phone: [phone]   Fax:  [phone]  Occupational Therapy Treatment Note and Progress Report: Lymphedema Care  Patient Details  Name: RAJEAN DESANTIAGO MRN: [id number] Date of Birth: 1945/12/18 Referring Provider (OT): Juluis Pitch, MD   Encounter Date: 08/02/2020   OT End of Session - 08/02/20 0939    Visit Number 70    Number of Visits 72    Date for OT Re-Evaluation 08/01/20    OT Start Time 0800    OT Stop Time 0930    OT Time Calculation (min) 90 min    Equipment Utilized During Treatment friction gloves and tyvek slipper    Activity Tolerance Patient tolerated treatment well;No increased pain   Chronic back pain worse since sliding off of bed when matress slid on frame. Denies leg pain   Behavior During Therapy [facility] for tasks assessed/performed   sleepiness throughout session          Past Medical History:  Diagnosis Date  . Anemia   . Anxiety   . Arthritis    "hands, feet" (03/03/2017)  . Brain lesion    2 types  . Cataract   . Cervical spondylosis with myelopathy   . Chest pain    Normal cardiac cath 5/09  . Chronic lower back pain   . CKD (chronic kidney disease), stage III ([place])    [name]  . Congestive heart failure (CHF) ([place])    has a Cardiomems implant   . Constipation   . Dementia ([place])   . Depression   . Dumping syndrome   . Dyspnea    with activity  . Edema   . Fatigue   . Fibromyalgia   . GERD (gastroesophageal reflux disease)   . Headache    "w/high CBG" (03/03/2017)  . History of echocardiogram    a. Echo 03/20/16 (done at [facility] in [place], [place]):  mild LVH, EF 13%, normal diastolic function, mild LAE, MAC, RVSP 25 mmHg  . Hyperlipidemia   . Hypertension   . Hypothyroidism   . Lumbar spondylosis   . Lymphedema    seeing specialist for this  . Migraine    "mostly stopped when I changed my diet"  (03/03/2017)  . OSA on CPAP   . Pneumonia X 1  . PONV (postoperative nausea and vomiting)   . Restless leg syndrome   . Rheumatoid arthritis ([place])   . Stroke [facility])    TIAs "mini strokes"- unsure of last TIA - pt and daughter deny this  . Syncope   . Tremors of nervous system   . Type II diabetes mellitus ([place])     Past Surgical History:  Procedure Laterality Date  . APPENDECTOMY  1966  . BACK SURGERY    . CARDIAC CATHETERIZATION N/A 05/25/2016   Procedure: Right/Left Heart Cath and Coronary Angiography;  Surgeon: Larey Dresser, MD;  Location: [place] CV LAB;  Service: Cardiovascular;  Laterality: N/A;  . CATARACT EXTRACTION W/ INTRAOCULAR LENS  IMPLANT, BILATERAL Bilateral   . COLONOSCOPY    . CORONARY ANGIOGRAM  2009   Normal coronaries  . EXCISION/RELEASE BURSA HIP Bilateral   . I & D KNEE WITH POLY EXCHANGE Left 03/02/2017   Procedure: Left knee Revision of poly liner;  Surgeon: Mcarthur Rossetti, MD;  Location: [place];  Service: Orthopedics;  Laterality: Left;  . JOINT REPLACEMENT    . KNEE  ARTHROSCOPY Bilateral   . LAPAROSCOPIC CHOLECYSTECTOMY  2015  . LMF  2017   CardiMEMS HF implant for CHF (measures amount of fluid in the heart)  . LUMBAR DISC SURGERY    . LUMBAR LAMINECTOMY/DECOMPRESSION MICRODISCECTOMY Left 12/2005   L2-3 laminectomy and diskectomy/notes 01/06/2011  . POSTERIOR LUMBAR FUSION  04/2006   [name] 01/06/2011; "put cages in"  . REVERSE SHOULDER ARTHROPLASTY Left 06/15/2017   Procedure: REVERSE LEFT SHOULDER ARTHROPLASTY;  Surgeon: Meredith Pel, MD;  Location: [place];  Service: Orthopedics;  Laterality: Left;  . REVERSE SHOULDER ARTHROPLASTY Right 02/01/2018  . REVERSE SHOULDER ARTHROPLASTY Right 02/01/2018   Procedure: RIGHT REVERSE SHOULDER ARTHROPLASTY;  Surgeon: Meredith Pel, MD;  Location: [place];  Service: Orthopedics;  Laterality: Right;  . REVISION TOTAL KNEE ARTHROPLASTY Left 03/02/2017   poly liner/notes 03/02/2017  . RIGHT  HEART CATH N/A 11/13/2016   Procedure: Right Heart Cath with Cardiomems;  Surgeon: Larey Dresser, MD;  Location: [place] CV LAB;  Service: Cardiovascular;  Laterality: N/A;  . SHOULDER ARTHROSCOPY WITH ROTATOR CUFF REPAIR Right   . SHOULDER OPEN ROTATOR CUFF REPAIR Left 01/2011   [name] 01/29/2011  . TOTAL ABDOMINAL HYSTERECTOMY    . TOTAL KNEE ARTHROPLASTY Left   . TOTAL KNEE ARTHROPLASTY Right 11/18/2012   Procedure: TOTAL KNEE ARTHROPLASTY;  Surgeon: Yvette Rack., MD;  Location: [place];  Service: Orthopedics;  Laterality: Right;  . TUBAL LIGATION    . UPPER GASTROINTESTINAL ENDOSCOPY    . UPPER GI ENDOSCOPY      There were no vitals filed for this visit.   Subjective Assessment - 08/02/20 0814    Subjective  Mrs Donofrio presents to OT for Rx visit 70/72 to address BLE lipo-lymphedema (I89.0). Pt presents wearng BLE custom compression stockings. Pt reports 5/10 in L thigh and knee. Pt reports 8 lb weight loss earlier in the week.    Pertinent History chronic BLE leg swelling and associated pain ~ 40 years; hx herniated intervertebral discL TKA; L total knee revision; HTN, Obesity. dementia; restless leg; CHF; unstable anina; R RC arthroplasty, OA, cervical spondylosis with myelopathy, chronic lower back pain, CKD (Stage?); Fibromyalgia; Hypothyroidism; OSA ( cPap?); hx pneumonia; RA, Type II Diabetes. S/P cardiac cathg, B THA, Lumbar laminectomy, posterior lumbar fusion, B shoulder arthroplasty; total abdominal hysterectomy    Limitations chronic leg swelling and associated pain, difficulty walking, impaired dynamic balance, muscle weakness,    Repetition Increases Symptoms    Special Tests + STEMMER sign base of toes bilaterally               LYMPHEDEMA/ONCOLOGY QUESTIONNAIRE - 08/02/20 0001      Right Lower Extremity Lymphedema   Other RLE A-D = 10795.5 ml. RLE E-G= 9625.0 ml.RLE ful limb voume (ankle to groin/A-G) = 20420.5 ml.    Other R leg volume (A-D) is INcreased by  22.5% since last measured on 07/05/20. R thigh (E-G) is INcreased by 13.5%%, and R full limb volume (ankle-groin/A-G) volume is iNcreased by 18.0% since last measured on 07/05/20.      Left Lower Extremity Lymphedema   Other L leg volume (A-D) is INcreased by 22.519.2 % since last measured on 11/128/27/21. L thigh (E-G) is INcreased by 15.6 %%, and R full limb volume (ankle-groin/A-G) volume is iNcreased by 17.5 % since last measured on 04/19/20..                   OT Treatments/Exercises (OP) - 08/02/20 0001  ADLs   ADL Education Given Yes      Manual Therapy   Manual Therapy Edema management    Edema Management BLE comparative limb volumetrics.    Compression Bandaging Max A to don and doff custom stocking                  OT Education - 08/02/20 0936    Education Details Pt edu re differential swelling diagnoes, namely lymphedema vs systemic fluid retention. Pt verbalized understanding of simplified explanation. Pt encouraged to speak with her primary care provided abouut volumetric findings revealed today illustrating bilater lower extremity, largely symmetrical limb swelling , which may be indicative of systemic fluid overload.    Person(s) Educated Patient    Methods Explanation;Demonstration    Comprehension Verbalized understanding;Returned demonstration;Need further instruction               OT Long Term Goals - 08/02/20 0949      OT LONG TERM GOAL #1   Title Pt will be able to apply LLE multi-layer, short stretch compression wraps daily with maximum caregiver assistance, using correct gradient techniques independently to return affected limb/s, as closely as possible, to premorbid size and shape, to limit infection risk, and to improve safe functional mobility and ADLs performance.    Baseline dependent    Time 4    Period Days    Status Achieved   Pt has no assistance at SNF for compression wrapping. Pt launders and rolls bandages independently.  Pt still requires ongoing verbal cues to lift leg and reposition wheil assistant waraps. 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     OT LONG TERM GOAL #2   Title Pt will be able to verbalize signs and symptoms of cellulitis infection and identify 4 common lymphedema precautions using printed resource for reference (modified independence) to limit LE progression over time.    Baseline Max A    Time 4    Period Days    Status Achieved      OT LONG TERM GOAL #3   Title Pt will sustain than 85% compliance with all daily LE self-care home program components throughout Intensive Phase CDT, including impeccable skin care, lymphatic pumping therex,  compression wraps and simple self MLD, to ensure optimal limb volume reduction, to limit infection risk and to limit LE progression.    Baseline dependent    Time 12    Period Weeks    Status Achieved   Pt 85% compliant. Staff at SNF not always available to support 85% compliance     OT LONG TERM GOAL #4   Title Pt to achieve at least 10% LLE limb volume reduction during Intensive Phase CDT with Max CG assistance for wrapping, to improve functional performance of basic and instrumental ADLs, and to limit LE progression.   to date LLE reductions: A-D= 15.7%, E-G= 29.63%, A-G= 22.9%. RLE: A-D: 6.7%, E-G= 15.7%, A-G= 11.2%   Baseline dependent    Time 12    Period Weeks    Status On-going      OT LONG TERM GOAL #5   Title During self-management phase of CDT Pt will retain limb volume reductions achieved during Intensive Phase CDT with no more than 3% volume increase using all LE self care components  to limit LE progression and further functional decline.    Baseline dependent    Time 6    Period Months    Status On-going  Plan - 08/02/20 0940    Clinical Impression Statement Completed BLE comparative limb volumetrics to measure progress towards OT goals. Dramatic, bilater,al largely symmetrical limb volume increases at all  lower extremity sements is likely secondary to systemic fluid retention resulting in lymphatic overload.Pt is discouraged by these results today as she continues to work hard to [employer] a high level of compliance with all home program components. . R leg volume (A-D) is INcreased by 22.5% since last measured on 07/05/20. R thigh (E-G) is INcreased by 13.5%%, and R full limb volume (ankle-groin/A-G) volume is iNcreased by 18.0% since last measured on 07/05/20. L leg volume (A-D) is INcreased by 22.519.2 % since last measured on 11/128/27/21. L thigh (E-G) is INcreased by 15.6 %%, and R full limb volume (ankle-groin/A-G) volume is iNcreased by 17.5 % since last measured on 04/19/20..We were concerned that custom compression garments were not effectively containing lipo-lymphedema. We have discussed increasing compression clas, but OT and patient agree that increased compression class will make donning even more difficult for caregivers. to manage. We will continue to monitor limb volumes and resume compression banaging if leg edema becomes difficult to contain with existing custom garments. Will also continue to monitor Pt's tolerance of sequential pneumatic compression device while fluid retention is exacerbated.DC Flexitouch immediately if Pt experiences SOB during Rx of single leg at a time only, 1 x daily. Refer to Long Term Goals section for details re additional goals. Cont 2 x weekly through remainder of 2021, then consider decreasing Rx frequency when stable.    OT Occupational Profile and History Problem Focused Assessment - Including review of records relating to presenting problem           Patient will benefit from skilled therapeutic intervention in order to improve the following deficits and impairments:           Visit Diagnosis: Lymphedema, not elsewhere classified    Problem List Patient Active Problem List   Diagnosis Date Noted  . Iron deficiency 08/06/2018  . History of total  left knee replacement (TKR) 07/18/2018  . Rectal bleeding 07/05/2018  . Hemorrhoids 07/05/2018  . Anal itching 07/05/2018  . Rotator cuff arthropathy of right shoulder   . Shoulder arthritis 06/15/2017  . Status post revision of total knee replacement, left 04/29/2017  . Polyethylene liner wear following left total knee arthroplasty requiring isolated polyethylene liner exchange ([place]) 03/02/2017  . 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 Unstable angina ([place]) 05/21/2016  . Chronic diastolic heart failure ([place]) 03/29/2016  . Normal coronary arteries 2009 01/14/2015  . Obesity-BMI 40 01/14/2015  . Restless leg 01/14/2015  . Chest pain 01/14/2015  . Back pain 01/14/2015  . Renal insufficiency 01/14/2015  . Dementia- mild memory issues 01/14/2015  . Occult blood positive stool 12/14/2014  . Anemia 12/14/2014  . Family history of colon cancer 12/14/2014  . Change in bowel habits 12/14/2014  . GERD (gastroesophageal reflux disease) 12/14/2014  . Dyspnea 05/17/2012  . Lymphedema 05/17/2012  . Weight gain 05/17/2012  . HTN (hypertension) 05/17/2012    Andrey Spearman, MS, OTR/L, [facility] 08/02/20 9:55 AM  [place] MAIN [facility] SERVICES [address] [place], [place], [address] Phone: [phone]   Fax:  [phone]  Name: ANDRAYA FRIGON MRN: [id number] Date of Birth: 03-18-1946

## 2020-08-05 ENCOUNTER — Other Ambulatory Visit: Payer: Self-pay

## 2020-08-05 ENCOUNTER — Ambulatory Visit: Payer: Medicare Other | Admitting: Occupational Therapy

## 2020-08-05 DIAGNOSIS — I890 Lymphedema, not elsewhere classified: Secondary | ICD-10-CM | POA: Diagnosis not present

## 2020-08-05 NOTE — Therapy (Signed)
[place] MAIN [facility] SERVICES [address] [place], [place], [address] Phone: [phone]   Fax:  [phone]  Occupational Therapy Treatment  Patient Details  Name: [name] MRN: [id number] Date of Birth: 09/03/1945 Referring Provider (OT): Juluis Pitch, MD   Encounter Date: 08/05/2020   OT End of Session - 08/05/20 0913    Visit Number 79    Number of Visits 72    Date for OT Re-Evaluation 11/03/20    Equipment Utilized During Treatment friction gloves and tyvek slipper    Activity Tolerance Patient tolerated treatment well;No increased pain   Chronic back pain worse since sliding off of bed when matress slid on frame. Denies leg pain   Behavior During Therapy [facility] for tasks assessed/performed   sleepiness throughout session          Past Medical History:  Diagnosis Date  . Anemia   . Anxiety   . Arthritis    "hands, feet" (03/03/2017)  . Brain lesion    2 types  . Cataract   . Cervical spondylosis with myelopathy   . Chest pain    Normal cardiac cath 5/09  . Chronic lower back pain   . CKD (chronic kidney disease), stage III ([place])    [name]  . Congestive heart failure (CHF) ([place])    has a Cardiomems implant   . Constipation   . Dementia ([place])   . Depression   . Dumping syndrome   . Dyspnea    with activity  . Edema   . Fatigue   . Fibromyalgia   . GERD (gastroesophageal reflux disease)   . Headache    "w/high CBG" (03/03/2017)  . History of echocardiogram    a. Echo 03/20/16 (done at [facility] in [place], [place]):  mild LVH, EF 50%, normal diastolic function, mild LAE, MAC, RVSP 25 mmHg  . Hyperlipidemia   . Hypertension   . Hypothyroidism   . Lumbar spondylosis   . Lymphedema    seeing specialist for this  . Migraine    "mostly stopped when I changed my diet" (03/03/2017)  . OSA on CPAP   . Pneumonia X 1  . PONV (postoperative nausea and vomiting)   . Restless leg syndrome   .  Rheumatoid arthritis ([place])   . Stroke [facility])    TIAs "mini strokes"- unsure of last TIA - pt and daughter deny this  . Syncope   . Tremors of nervous system   . Type II diabetes mellitus ([place])     Past Surgical History:  Procedure Laterality Date  . APPENDECTOMY  1966  . BACK SURGERY    . CARDIAC CATHETERIZATION N/A 05/25/2016   Procedure: Right/Left Heart Cath and Coronary Angiography;  Surgeon: Larey Dresser, MD;  Location: [place] CV LAB;  Service: Cardiovascular;  Laterality: N/A;  . CATARACT EXTRACTION W/ INTRAOCULAR LENS  IMPLANT, BILATERAL Bilateral   . COLONOSCOPY    . CORONARY ANGIOGRAM  2009   Normal coronaries  . EXCISION/RELEASE BURSA HIP Bilateral   . I & D KNEE WITH POLY EXCHANGE Left 03/02/2017   Procedure: Left knee Revision of poly liner;  Surgeon: Mcarthur Rossetti, MD;  Location: [place];  Service: Orthopedics;  Laterality: Left;  . JOINT REPLACEMENT    . KNEE ARTHROSCOPY Bilateral   . LAPAROSCOPIC CHOLECYSTECTOMY  2015  . LMF  2017   CardiMEMS HF implant for CHF (measures amount of fluid in the heart)  .  LUMBAR DISC SURGERY    . LUMBAR LAMINECTOMY/DECOMPRESSION MICRODISCECTOMY Left 12/2005   L2-3 laminectomy and diskectomy/notes 01/06/2011  . POSTERIOR LUMBAR FUSION  04/2006   [name] 01/06/2011; "put cages in"  . REVERSE SHOULDER ARTHROPLASTY Left 06/15/2017   Procedure: REVERSE LEFT SHOULDER ARTHROPLASTY;  Surgeon: Meredith Pel, MD;  Location: [place];  Service: Orthopedics;  Laterality: Left;  . REVERSE SHOULDER ARTHROPLASTY Right 02/01/2018  . REVERSE SHOULDER ARTHROPLASTY Right 02/01/2018   Procedure: RIGHT REVERSE SHOULDER ARTHROPLASTY;  Surgeon: Meredith Pel, MD;  Location: [place];  Service: Orthopedics;  Laterality: Right;  . REVISION TOTAL KNEE ARTHROPLASTY Left 03/02/2017   poly liner/notes 03/02/2017  . RIGHT HEART CATH N/A 11/13/2016   Procedure: Right Heart Cath with Cardiomems;  Surgeon: Larey Dresser, MD;  Location: [place]  CV LAB;  Service: Cardiovascular;  Laterality: N/A;  . SHOULDER ARTHROSCOPY WITH ROTATOR CUFF REPAIR Right   . SHOULDER OPEN ROTATOR CUFF REPAIR Left 01/2011   [name] 01/29/2011  . TOTAL ABDOMINAL HYSTERECTOMY    . TOTAL KNEE ARTHROPLASTY Left   . TOTAL KNEE ARTHROPLASTY Right 11/18/2012   Procedure: TOTAL KNEE ARTHROPLASTY;  Surgeon: Yvette Rack., MD;  Location: [place];  Service: Orthopedics;  Laterality: Right;  . TUBAL LIGATION    . UPPER GASTROINTESTINAL ENDOSCOPY    . UPPER GI ENDOSCOPY      There were no vitals filed for this visit.   Subjective Assessment - 08/05/20 0911    Subjective  Mrs Corbo presents to OT for Rx visit 71/72 to address BLE lipo-lymphedema (I89.0). Pt presents wearng BLE custom compression stockings. Pt denies LE related leg pain this morning.    Pertinent History chronic BLE leg swelling and associated pain ~ 40 years; hx herniated intervertebral discL TKA; L total knee revision; HTN, Obesity. dementia; restless leg; CHF; unstable anina; R RC arthroplasty, OA, cervical spondylosis with myelopathy, chronic lower back pain, CKD (Stage?); Fibromyalgia; Hypothyroidism; OSA ( cPap?); hx pneumonia; RA, Type II Diabetes. S/P cardiac cathg, B THA, Lumbar laminectomy, posterior lumbar fusion, B shoulder arthroplasty; total abdominal hysterectomy    Limitations chronic leg swelling and associated pain, difficulty walking, impaired dynamic balance, muscle weakness,    Repetition Increases Symptoms    Special Tests + STEMMER sign base of toes bilaterally                        OT Treatments/Exercises (OP) - 08/05/20 0001      ADLs   ADL Education Given Yes      Manual Therapy   Manual Therapy Edema management;Manual Lymphatic Drainage (MLD)    Manual Lymphatic Drainage (MLD) LLE MLD as established. fibrosis techniqes to distal leg and ankle fat pads    Compression Bandaging Max A to don and doff custom stocking                  OT Education -  08/05/20 0912    Education Details Continued skilled Pt/caregiver education  And LE ADL training throughout visit for lymphedema self care/ home program, including compression wrapping, compression garment and device wear/care, lymphatic pumping ther ex, simple self-MLD, and skin care. Discussed progress towards goals.    Person(s) Educated Patient    Methods Explanation;Demonstration    Comprehension Verbalized understanding;Returned demonstration               OT Long Term Goals - 08/02/20 0949      OT LONG TERM GOAL #1  Title Pt will be able to apply LLE multi-layer, short stretch compression wraps daily with maximum caregiver assistance, using correct gradient techniques independently to return affected limb/s, as closely as possible, to premorbid size and shape, to limit infection risk, and to improve safe functional mobility and ADLs performance.    Baseline dependent    Time 4    Period Days    Status Achieved   Pt has no assistance at SNF for compression wrapping. Pt launders and rolls bandages independently. Pt still requires ongoing verbal cues to lift leg and reposition wheil assistant waraps. 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     OT LONG TERM GOAL #2   Title Pt will be able to verbalize signs and symptoms of cellulitis infection and identify 4 common lymphedema precautions using printed resource for reference (modified independence) to limit LE progression over time.    Baseline Max A    Time 4    Period Days    Status Achieved      OT LONG TERM GOAL #3   Title Pt will sustain than 85% compliance with all daily LE self-care home program components throughout Intensive Phase CDT, including impeccable skin care, lymphatic pumping therex,  compression wraps and simple self MLD, to ensure optimal limb volume reduction, to limit infection risk and to limit LE progression.    Baseline dependent    Time 12    Period Weeks    Status Achieved   Pt 85% compliant. Staff at  SNF not always available to support 85% compliance     OT LONG TERM GOAL #4   Title Pt to achieve at least 10% LLE limb volume reduction during Intensive Phase CDT with Max CG assistance for wrapping, to improve functional performance of basic and instrumental ADLs, and to limit LE progression.   to date LLE reductions: A-D= 15.7%, E-G= 29.63%, A-G= 22.9%. RLE: A-D: 6.7%, E-G= 15.7%, A-G= 11.2%   Baseline dependent    Time 12    Period Weeks    Status On-going      OT LONG TERM GOAL #5   Title During self-management phase of CDT Pt will retain limb volume reductions achieved during Intensive Phase CDT with no more than 3% volume increase using all LE self care components  to limit LE progression and further functional decline.    Baseline dependent    Time 6    Period Months    Status On-going                 Plan - 08/05/20 1004    Clinical Impression Statement Leg swelling slightly decreased since last visit. Completed new recertification . Pt tolerated LLE MLD, skin care and compression theray without increased pain.. Cont as per POC 2 x weekly for transition to management phase CDT.           Patient will benefit from skilled therapeutic intervention in order to improve the following deficits and impairments:           Visit Diagnosis: Lymphedema, not elsewhere classified - Plan: Ot plan of care cert/re-cert    Problem List Patient Active Problem List   Diagnosis Date Noted  . Iron deficiency 08/06/2018  . History of total left knee replacement (TKR) 07/18/2018  . Rectal bleeding 07/05/2018  . Hemorrhoids 07/05/2018  . Anal itching 07/05/2018  . Rotator cuff arthropathy of right shoulder   . Shoulder arthritis 06/15/2017  . Status post revision of total knee replacement, left  04/29/2017  . Polyethylene liner wear following left total knee arthroplasty requiring isolated polyethylene liner exchange ([place]) 03/02/2017  . 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 Unstable angina ([place]) 05/21/2016  . Chronic diastolic heart failure ([place]) 03/29/2016  . Normal coronary arteries 2009 01/14/2015  . Obesity-BMI 40 01/14/2015  . Restless leg 01/14/2015  . Chest pain 01/14/2015  . Back pain 01/14/2015  . Renal insufficiency 01/14/2015  . Dementia- mild memory issues 01/14/2015  . Occult blood positive stool 12/14/2014  . Anemia 12/14/2014  . Family history of colon cancer 12/14/2014  . Change in bowel habits 12/14/2014  . GERD (gastroesophageal reflux disease) 12/14/2014  . Dyspnea 05/17/2012  . Lymphedema 05/17/2012  . Weight gain 05/17/2012  . HTN (hypertension) 05/17/2012    Andrey Spearman, MS, OTR/L, [facility] 08/05/20 10:08 AM   [place] MAIN [facility] SERVICES [address] [place], [place], [address] Phone: [phone]   Fax:  [phone]  Name: LOUELLEN HALDEMAN MRN: [id number] Date of Birth: 07/22/1946

## 2020-08-07 ENCOUNTER — Encounter: Payer: Medicare Other | Admitting: Occupational Therapy

## 2020-08-09 ENCOUNTER — Other Ambulatory Visit: Payer: Self-pay

## 2020-08-09 ENCOUNTER — Ambulatory Visit: Payer: Medicare Other | Admitting: Occupational Therapy

## 2020-08-09 DIAGNOSIS — I890 Lymphedema, not elsewhere classified: Secondary | ICD-10-CM | POA: Diagnosis not present

## 2020-08-09 NOTE — Therapy (Signed)
[place] MAIN [facility] SERVICES [address] [place], [place], [address] Phone: [phone]   Fax:  [phone]  Occupational Therapy Treatment  Patient Details  Name: [name] MRN: [id number] Date of Birth: 07-24-1946 Referring Provider (OT): Juluis Pitch, MD   Encounter Date: 08/09/2020   OT End of Session - 08/09/20 1208    Visit Number 72    Number of Visits 72    Date for OT Re-Evaluation 11/03/20    OT Start Time 0800    OT Stop Time 0910    OT Time Calculation (min) 70 min    Equipment Utilized During Treatment friction gloves and tyvek slipper    Activity Tolerance Patient tolerated treatment well;No increased pain   Chronic back pain worse since sliding off of bed when matress slid on frame. Denies leg pain   Behavior During Therapy [facility] for tasks assessed/performed   sleepiness throughout session          Past Medical History:  Diagnosis Date  . Anemia   . Anxiety   . Arthritis    "hands, feet" (03/03/2017)  . Brain lesion    2 types  . Cataract   . Cervical spondylosis with myelopathy   . Chest pain    Normal cardiac cath 5/09  . Chronic lower back pain   . CKD (chronic kidney disease), stage III ([place])    [name]  . Congestive heart failure (CHF) ([place])    has a Cardiomems implant   . Constipation   . Dementia ([place])   . Depression   . Dumping syndrome   . Dyspnea    with activity  . Edema   . Fatigue   . Fibromyalgia   . GERD (gastroesophageal reflux disease)   . Headache    "w/high CBG" (03/03/2017)  . History of echocardiogram    a. Echo 03/20/16 (done at [facility] in [place], [place]):  mild LVH, EF 20%, normal diastolic function, mild LAE, MAC, RVSP 25 mmHg  . Hyperlipidemia   . Hypertension   . Hypothyroidism   . Lumbar spondylosis   . Lymphedema    seeing specialist for this  . Migraine    "mostly stopped when I changed my diet" (03/03/2017)  . OSA on CPAP   . Pneumonia  X 1  . PONV (postoperative nausea and vomiting)   . Restless leg syndrome   . Rheumatoid arthritis ([place])   . Stroke [facility])    TIAs "mini strokes"- unsure of last TIA - pt and daughter deny this  . Syncope   . Tremors of nervous system   . Type II diabetes mellitus ([place])     Past Surgical History:  Procedure Laterality Date  . APPENDECTOMY  1966  . BACK SURGERY    . CARDIAC CATHETERIZATION N/A 05/25/2016   Procedure: Right/Left Heart Cath and Coronary Angiography;  Surgeon: Larey Dresser, MD;  Location: [place] CV LAB;  Service: Cardiovascular;  Laterality: N/A;  . CATARACT EXTRACTION W/ INTRAOCULAR LENS  IMPLANT, BILATERAL Bilateral   . COLONOSCOPY    . CORONARY ANGIOGRAM  2009   Normal coronaries  . EXCISION/RELEASE BURSA HIP Bilateral   . I & D KNEE WITH POLY EXCHANGE Left 03/02/2017   Procedure: Left knee Revision of poly liner;  Surgeon: Mcarthur Rossetti, MD;  Location: [place];  Service: Orthopedics;  Laterality: Left;  . JOINT REPLACEMENT    . KNEE ARTHROSCOPY Bilateral   . LAPAROSCOPIC  CHOLECYSTECTOMY  2015  . LMF  2017   CardiMEMS HF implant for CHF (measures amount of fluid in the heart)  . LUMBAR DISC SURGERY    . LUMBAR LAMINECTOMY/DECOMPRESSION MICRODISCECTOMY Left 12/2005   L2-3 laminectomy and diskectomy/notes 01/06/2011  . POSTERIOR LUMBAR FUSION  04/2006   [name] 01/06/2011; "put cages in"  . REVERSE SHOULDER ARTHROPLASTY Left 06/15/2017   Procedure: REVERSE LEFT SHOULDER ARTHROPLASTY;  Surgeon: Meredith Pel, MD;  Location: [place];  Service: Orthopedics;  Laterality: Left;  . REVERSE SHOULDER ARTHROPLASTY Right 02/01/2018  . REVERSE SHOULDER ARTHROPLASTY Right 02/01/2018   Procedure: RIGHT REVERSE SHOULDER ARTHROPLASTY;  Surgeon: Meredith Pel, MD;  Location: [place];  Service: Orthopedics;  Laterality: Right;  . REVISION TOTAL KNEE ARTHROPLASTY Left 03/02/2017   poly liner/notes 03/02/2017  . RIGHT HEART CATH N/A 11/13/2016   Procedure: Right  Heart Cath with Cardiomems;  Surgeon: Larey Dresser, MD;  Location: [place] CV LAB;  Service: Cardiovascular;  Laterality: N/A;  . SHOULDER ARTHROSCOPY WITH ROTATOR CUFF REPAIR Right   . SHOULDER OPEN ROTATOR CUFF REPAIR Left 01/2011   [name] 01/29/2011  . TOTAL ABDOMINAL HYSTERECTOMY    . TOTAL KNEE ARTHROPLASTY Left   . TOTAL KNEE ARTHROPLASTY Right 11/18/2012   Procedure: TOTAL KNEE ARTHROPLASTY;  Surgeon: Yvette Rack., MD;  Location: [place];  Service: Orthopedics;  Laterality: Right;  . TUBAL LIGATION    . UPPER GASTROINTESTINAL ENDOSCOPY    . UPPER GI ENDOSCOPY      There were no vitals filed for this visit.   Subjective Assessment - 08/09/20 0815    Subjective  [name] presents to OT for Rx visit 72/72 to address BLE lipo-lymphedema (I89.0). Pt presents wearng BLE custom compression stockings. Pt denies LE related leg pain this morning.                        OT Treatments/Exercises (OP) - 08/09/20 0001      ADLs   ADL Education Given Yes      Manual Therapy   Manual Therapy Edema management    Manual Lymphatic Drainage (MLD) LLE MLD as established. fibrosis techniqes to distal leg and ankle fat pads    Compression Bandaging Max A to don and doff custom stocking                  OT Education - 08/09/20 0818    Education Details Continued skilled Pt/caregiver education  And LE ADL training throughout visit for lymphedema self care/ home program, including compression wrapping, compression garment and device wear/care, lymphatic pumping ther ex, simple self-MLD, and skin care. Discussed progress towards goals.    Person(s) Educated Patient    Methods Explanation;Demonstration    Comprehension Verbalized understanding;Returned demonstration               OT Long Term Goals - 08/02/20 0949      OT LONG TERM GOAL #1   Title Pt will be able to apply LLE multi-layer, short stretch compression wraps daily with maximum caregiver assistance,  using correct gradient techniques independently to return affected limb/s, as closely as possible, to premorbid size and shape, to limit infection risk, and to improve safe functional mobility and ADLs performance.    Baseline dependent    Time 4    Period Days    Status Achieved   Pt has no assistance at SNF for compression wrapping. Pt launders and rolls bandages independently.  Pt still requires ongoing verbal cues to lift leg and reposition wheil assistant waraps. 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     OT LONG TERM GOAL #2   Title Pt will be able to verbalize signs and symptoms of cellulitis infection and identify 4 common lymphedema precautions using printed resource for reference (modified independence) to limit LE progression over time.    Baseline Max A    Time 4    Period Days    Status Achieved      OT LONG TERM GOAL #3   Title Pt will sustain than 85% compliance with all daily LE self-care home program components throughout Intensive Phase CDT, including impeccable skin care, lymphatic pumping therex,  compression wraps and simple self MLD, to ensure optimal limb volume reduction, to limit infection risk and to limit LE progression.    Baseline dependent    Time 12    Period Weeks    Status Achieved   Pt 85% compliant. Staff at SNF not always available to support 85% compliance     OT LONG TERM GOAL #4   Title Pt to achieve at least 10% LLE limb volume reduction during Intensive Phase CDT with Max CG assistance for wrapping, to improve functional performance of basic and instrumental ADLs, and to limit LE progression.   to date LLE reductions: A-D= 15.7%, E-G= 29.63%, A-G= 22.9%. RLE: A-D: 6.7%, E-G= 15.7%, A-G= 11.2%   Baseline dependent    Time 12    Period Weeks    Status On-going      OT LONG TERM GOAL #5   Title During self-management phase of CDT Pt will retain limb volume reductions achieved during Intensive Phase CDT with no more than 3% volume increase using all  LE self care components  to limit LE progression and further functional decline.    Baseline dependent    Time 6    Period Months    Status On-going                 Plan - 08/09/20 1207    Clinical Impression Statement Pt tolerated LLE MLD, skin care and compression theray without increased pain. Increased leg swelling noted bilaterally again today. Cont as per POC 2 x weekly for transition to management phase CDT.           Patient will benefit from skilled therapeutic intervention in order to improve the following deficits and impairments:           Visit Diagnosis: Lymphedema, not elsewhere classified    Problem List Patient Active Problem List   Diagnosis Date Noted  . Iron deficiency 08/06/2018  . History of total left knee replacement (TKR) 07/18/2018  . Rectal bleeding 07/05/2018  . Hemorrhoids 07/05/2018  . Anal itching 07/05/2018  . Rotator cuff arthropathy of right shoulder   . Shoulder arthritis 06/15/2017  . Status post revision of total knee replacement, left 04/29/2017  . Polyethylene liner wear following left total knee arthroplasty requiring isolated polyethylene liner exchange ([place]) 03/02/2017  . 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 Unstable angina ([place]) 05/21/2016  . Chronic diastolic heart failure ([place]) 03/29/2016  . Normal coronary arteries 2009 01/14/2015  . Obesity-BMI 40 01/14/2015  . Restless leg 01/14/2015  . Chest pain 01/14/2015  . Back pain 01/14/2015  . Renal insufficiency 01/14/2015  . Dementia- mild memory issues 01/14/2015  . Occult blood positive stool 12/14/2014  . Anemia 12/14/2014  . Family history  of colon cancer 12/14/2014  . Change in bowel habits 12/14/2014  . GERD (gastroesophageal reflux disease) 12/14/2014  . Dyspnea 05/17/2012  . Lymphedema 05/17/2012  . Weight gain 05/17/2012  . HTN (hypertension) 05/17/2012    Andrey Spearman, MS, OTR/L, [facility] 08/09/20 12:09 PM  [place] MAIN [facility] SERVICES [address] [place], [place], [address] Phone: [phone]   Fax:  [phone]  Name: AMARANTA MEHL MRN: [id number] Date of Birth: 1946/06/15

## 2020-08-12 ENCOUNTER — Ambulatory Visit: Payer: Medicare Other | Admitting: Occupational Therapy

## 2020-08-12 ENCOUNTER — Other Ambulatory Visit: Payer: Self-pay

## 2020-08-12 DIAGNOSIS — I890 Lymphedema, not elsewhere classified: Secondary | ICD-10-CM | POA: Diagnosis not present

## 2020-08-12 NOTE — Therapy (Signed)
[name] MAIN [facility] SERVICES [address] [place], [place], [address] Phone: [phone]   Fax:  [phone]  Occupational Therapy Treatment  Patient Details  Name: [name] MRN: [id number] Date of Birth: 10/18/1945 Referring Provider (OT): [name] Pitch, MD   Encounter Date: 08/12/2020   OT End of Session - 08/12/20 1003    Visit Number 49    Number of Visits 72    Date for OT Re-Evaluation 11/03/20    OT Start Time 0905    OT Stop Time 1005    OT Time Calculation (min) 60 min    Equipment Utilized During Treatment friction gloves and tyvek slipper    Activity Tolerance Patient tolerated treatment well;No increased pain   Chronic back pain worse since sliding off of bed when matress slid on frame. Denies leg pain   Behavior During Therapy [name] for tasks assessed/performed   sleepiness throughout session          Past Medical History:  Diagnosis Date  . Anemia   . Anxiety   . Arthritis    "hands, feet" (03/03/2017)  . Brain lesion    2 types  . Cataract   . Cervical spondylosis with myelopathy   . Chest pain    Normal cardiac cath 5/09  . Chronic lower back pain   . CKD (chronic kidney disease), stage III ([place])    [name]  . Congestive heart failure (CHF) ([place])    has a Cardiomems implant   . Constipation   . Dementia ([place])   . Depression   . Dumping syndrome   . Dyspnea    with activity  . Edema   . Fatigue   . Fibromyalgia   . GERD (gastroesophageal reflux disease)   . Headache    "w/high CBG" (03/03/2017)  . History of echocardiogram    a. Echo 03/20/16 (done at [facility] in [place], [place]):  mild LVH, EF 25%, normal diastolic function, mild LAE, MAC, RVSP 25 mmHg  . Hyperlipidemia   . Hypertension   . Hypothyroidism   . Lumbar spondylosis   . Lymphedema    seeing specialist for this  . Migraine    "mostly stopped when I changed my diet" (03/03/2017)  . OSA on CPAP   . Pneumonia  X 1  . PONV (postoperative nausea and vomiting)   . Restless leg syndrome   . Rheumatoid arthritis ([place])   . Stroke [facility])    TIAs "mini strokes"- unsure of last TIA - pt and daughter deny this  . Syncope   . Tremors of nervous system   . Type II diabetes mellitus ([place])     Past Surgical History:  Procedure Laterality Date  . APPENDECTOMY  1966  . BACK SURGERY    . CARDIAC CATHETERIZATION N/A 05/25/2016   Procedure: Right/Left Heart Cath and Coronary Angiography;  Surgeon: Larey Dresser, MD;  Location: [place] CV LAB;  Service: Cardiovascular;  Laterality: N/A;  . CATARACT EXTRACTION W/ INTRAOCULAR LENS  IMPLANT, BILATERAL Bilateral   . COLONOSCOPY    . CORONARY ANGIOGRAM  2009   Normal coronaries  . EXCISION/RELEASE BURSA HIP Bilateral   . I & D KNEE WITH POLY EXCHANGE Left 03/02/2017   Procedure: Left knee Revision of poly liner;  Surgeon: Mcarthur Rossetti, MD;  Location: [place];  Service: Orthopedics;  Laterality: Left;  . JOINT REPLACEMENT    . KNEE ARTHROSCOPY Bilateral   . LAPAROSCOPIC  CHOLECYSTECTOMY  2015  . LMF  2017   CardiMEMS HF implant for CHF (measures amount of fluid in the heart)  . LUMBAR DISC SURGERY    . LUMBAR LAMINECTOMY/DECOMPRESSION MICRODISCECTOMY Left 12/2005   L2-3 laminectomy and diskectomy/notes 01/06/2011  . POSTERIOR LUMBAR FUSION  04/2006   [name] 01/06/2011; "put cages in"  . REVERSE SHOULDER ARTHROPLASTY Left 06/15/2017   Procedure: REVERSE LEFT SHOULDER ARTHROPLASTY;  Surgeon: Meredith Pel, MD;  Location: [place];  Service: Orthopedics;  Laterality: Left;  . REVERSE SHOULDER ARTHROPLASTY Right 02/01/2018  . REVERSE SHOULDER ARTHROPLASTY Right 02/01/2018   Procedure: RIGHT REVERSE SHOULDER ARTHROPLASTY;  Surgeon: Meredith Pel, MD;  Location: [place];  Service: Orthopedics;  Laterality: Right;  . REVISION TOTAL KNEE ARTHROPLASTY Left 03/02/2017   poly liner/notes 03/02/2017  . RIGHT HEART CATH N/A 11/13/2016   Procedure: Right  Heart Cath with Cardiomems;  Surgeon: Larey Dresser, MD;  Location: [place] CV LAB;  Service: Cardiovascular;  Laterality: N/A;  . SHOULDER ARTHROSCOPY WITH ROTATOR CUFF REPAIR Right   . SHOULDER OPEN ROTATOR CUFF REPAIR Left 01/2011   [name] 01/29/2011  . TOTAL ABDOMINAL HYSTERECTOMY    . TOTAL KNEE ARTHROPLASTY Left   . TOTAL KNEE ARTHROPLASTY Right 11/18/2012   Procedure: TOTAL KNEE ARTHROPLASTY;  Surgeon: Yvette Rack., MD;  Location: [place];  Service: Orthopedics;  Laterality: Right;  . TUBAL LIGATION    . UPPER GASTROINTESTINAL ENDOSCOPY    . UPPER GI ENDOSCOPY      There were no vitals filed for this visit.   Subjective Assessment - 08/12/20 1000    Subjective  [name] presents to OT for Rx visit 72/108 to address BLE lipo-lymphedema (I89.0). Pt presents wearing BLE custom compression stockings. Pt denies LE related leg pain.                        OT Treatments/Exercises (OP) - 08/12/20 0001      ADLs   ADL Education Given Yes      Manual Therapy   Manual Therapy Edema management    Manual Lymphatic Drainage (MLD) RLE MLD as established. fibrosis techniqes to distal leg and ankle fat pads    Compression Bandaging Max A to don and doff custom stocking                  OT Education - 08/12/20 1003    Education Details Continued skilled Pt/caregiver education  And LE ADL training throughout visit for lymphedema self care/ home program, including compression wrapping, compression garment and device wear/care, lymphatic pumping ther ex, simple self-MLD, and skin care. Discussed progress towards goals.    Person(s) Educated Patient    Methods Explanation;Demonstration    Comprehension Verbalized understanding;Returned demonstration               OT Long Term Goals - 08/02/20 0949      OT LONG TERM GOAL #1   Title Pt will be able to apply LLE multi-layer, short stretch compression wraps daily with maximum caregiver assistance, using correct  gradient techniques independently to return affected limb/s, as closely as possible, to premorbid size and shape, to limit infection risk, and to improve safe functional mobility and ADLs performance.    Baseline dependent    Time 4    Period Days    Status Achieved   Pt has no assistance at SNF for compression wrapping. Pt launders and rolls bandages independently. Pt still  requires ongoing verbal cues to lift leg and reposition wheil assistant waraps. 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     OT LONG TERM GOAL #2   Title Pt will be able to verbalize signs and symptoms of cellulitis infection and identify 4 common lymphedema precautions using printed resource for reference (modified independence) to limit LE progression over time.    Baseline Max A    Time 4    Period Days    Status Achieved      OT LONG TERM GOAL #3   Title Pt will sustain than 85% compliance with all daily LE self-care home program components throughout Intensive Phase CDT, including impeccable skin care, lymphatic pumping therex,  compression wraps and simple self MLD, to ensure optimal limb volume reduction, to limit infection risk and to limit LE progression.    Baseline dependent    Time 12    Period Weeks    Status Achieved   Pt 85% compliant. Staff at SNF not always available to support 85% compliance     OT LONG TERM GOAL #4   Title Pt to achieve at least 10% LLE limb volume reduction during Intensive Phase CDT with Max CG assistance for wrapping, to improve functional performance of basic and instrumental ADLs, and to limit LE progression.   to date LLE reductions: A-D= 15.7%, E-G= 29.63%, A-G= 22.9%. RLE: A-D: 6.7%, E-G= 15.7%, A-G= 11.2%   Baseline dependent    Time 12    Period Weeks    Status On-going      OT LONG TERM GOAL #5   Title During self-management phase of CDT Pt will retain limb volume reductions achieved during Intensive Phase CDT with no more than 3% volume increase using all LE self care  components  to limit LE progression and further functional decline.    Baseline dependent    Time 6    Period Months    Status On-going                 Plan - 08/12/20 1131    Clinical Impression Statement Pt tolerated LLE MLD, skin care to LLE today with slight increase in tenderness over reddened area on anterior distal leg. No signs or symptoms of skin breakdown bor infection greater than baseline skin redness is observed,  Increased tissue density and leg swelling continues to fluctuate bilaterally, but symptoms are not as pronounced today as at last visit.Cont as per POC. Pt resumes OT for CDT the week of Aug 26, 2020 after a holiday break. We'll consider further decreasing OT frequency to 1 x weekly after we discuss management with SNIF assistance alone.    Occupational performance deficits (Please refer to evaluation for details): Play           Patient will benefit from skilled therapeutic intervention in order to improve the following deficits and impairments:           Visit Diagnosis: Lymphedema, not elsewhere classified    Problem List Patient Active Problem List   Diagnosis Date Noted  . Iron deficiency 08/06/2018  . History of total left knee replacement (TKR) 07/18/2018  . Rectal bleeding 07/05/2018  . Hemorrhoids 07/05/2018  . Anal itching 07/05/2018  . Rotator cuff arthropathy of right shoulder   . Shoulder arthritis 06/15/2017  . Status post revision of total knee replacement, left 04/29/2017  . Polyethylene liner wear following left total knee arthroplasty requiring isolated polyethylene liner exchange ([place]) 03/02/2017  . Polyethylene wear of  left knee joint prosthesis ([place]) 03/02/2017  . Unstable angina ([place]) 05/21/2016  . Chronic diastolic heart failure ([place]) 03/29/2016  . Normal coronary arteries 2009 01/14/2015  . Obesity-BMI 40 01/14/2015  . Restless leg 01/14/2015  . Chest pain 01/14/2015  . Back pain 01/14/2015  . Renal insufficiency  01/14/2015  . Dementia- mild memory issues 01/14/2015  . Occult blood positive stool 12/14/2014  . Anemia 12/14/2014  . Family history of colon cancer 12/14/2014  . Change in bowel habits 12/14/2014  . GERD (gastroesophageal reflux disease) 12/14/2014  . Dyspnea 05/17/2012  . Lymphedema 05/17/2012  . Weight gain 05/17/2012  . HTN (hypertension) 05/17/2012    Andrey Spearman, MS, OTR/L, [facility] 08/12/20 11:39 AM   [place] MAIN [facility] SERVICES [address] [place], [place], [address] Phone: [phone]   Fax:  [phone]  Name: [name] MRN: [id number] Date of Birth: 03/04/1946

## 2020-08-14 ENCOUNTER — Encounter: Payer: Medicare Other | Admitting: Occupational Therapy

## 2020-08-26 ENCOUNTER — Other Ambulatory Visit: Payer: Self-pay

## 2020-08-26 ENCOUNTER — Ambulatory Visit: Payer: Medicare Other | Attending: Family Medicine | Admitting: Occupational Therapy

## 2020-08-26 DIAGNOSIS — I890 Lymphedema, not elsewhere classified: Secondary | ICD-10-CM | POA: Insufficient documentation

## 2020-08-26 NOTE — Therapy (Signed)
[place] MAIN [facility] SERVICES [address] [place], [place], [address] Phone: [phone]   Fax:  [phone]  Occupational Therapy Treatment  Patient Details  Name: [name] MRN: [id number] Date of Birth: Jun 22, 1946 Referring Provider (OT): Juluis Pitch, MD   Encounter Date: 08/26/2020   OT End of Session - 08/26/20 0922    Visit Number 1   74/108   Number of Visits 36    Date for OT Re-Evaluation 11/03/20    OT Start Time 0802    OT Stop Time 0905    OT Time Calculation (min) 63 min    Equipment Utilized During Treatment friction gloves and tyvek slipper    Activity Tolerance Patient tolerated treatment well;No increased pain   Chronic back pain worse since sliding off of bed when matress slid on frame. Denies leg pain   Behavior During Therapy [facility] for tasks assessed/performed   sleepiness throughout session          Past Medical History:  Diagnosis Date  . Anemia   . Anxiety   . Arthritis    "hands, feet" (03/03/2017)  . Brain lesion    2 types  . Cataract   . Cervical spondylosis with myelopathy   . Chest pain    Normal cardiac cath 5/09  . Chronic lower back pain   . CKD (chronic kidney disease), stage III ([place])    [name]  . Congestive heart failure (CHF) ([place])    has a Cardiomems implant   . Constipation   . Dementia ([place])   . Depression   . Dumping syndrome   . Dyspnea    with activity  . Edema   . Fatigue   . Fibromyalgia   . GERD (gastroesophageal reflux disease)   . Headache    "w/high CBG" (03/03/2017)  . History of echocardiogram    a. Echo 03/20/16 (done at [facility] in [place], [place]):  mild LVH, EF 54%, normal diastolic function, mild LAE, MAC, RVSP 25 mmHg  . Hyperlipidemia   . Hypertension   . Hypothyroidism   . Lumbar spondylosis   . Lymphedema    seeing specialist for this  . Migraine    "mostly stopped when I changed my diet" (03/03/2017)  . OSA on CPAP   .  Pneumonia X 1  . PONV (postoperative nausea and vomiting)   . Restless leg syndrome   . Rheumatoid arthritis ([place])   . Stroke [facility])    TIAs "mini strokes"- unsure of last TIA - pt and daughter deny this  . Syncope   . Tremors of nervous system   . Type II diabetes mellitus ([place])     Past Surgical History:  Procedure Laterality Date  . APPENDECTOMY  1966  . BACK SURGERY    . CARDIAC CATHETERIZATION N/A 05/25/2016   Procedure: Right/Left Heart Cath and Coronary Angiography;  Surgeon: Larey Dresser, MD;  Location: [place] CV [facility];  Service: Cardiovascular;  Laterality: N/A;  . CATARACT EXTRACTION W/ INTRAOCULAR LENS  IMPLANT, BILATERAL Bilateral   . COLONOSCOPY    . CORONARY ANGIOGRAM  2009   Normal coronaries  . EXCISION/RELEASE BURSA HIP Bilateral   . I & D KNEE WITH POLY EXCHANGE Left 03/02/2017   Procedure: Left knee Revision of poly liner;  Surgeon: Mcarthur Rossetti, MD;  Location: [place];  Service: Orthopedics;  Laterality: Left;  . JOINT REPLACEMENT    . KNEE ARTHROSCOPY Bilateral   .  LAPAROSCOPIC CHOLECYSTECTOMY  2015  . LMF  2017   CardiMEMS HF implant for CHF (measures amount of fluid in the heart)  . LUMBAR DISC SURGERY    . LUMBAR LAMINECTOMY/DECOMPRESSION MICRODISCECTOMY Left 12/2005   L2-3 laminectomy and diskectomy/notes 01/06/2011  . POSTERIOR LUMBAR FUSION  04/2006   [name] 01/06/2011; "put cages in"  . REVERSE SHOULDER ARTHROPLASTY Left 06/15/2017   Procedure: REVERSE LEFT SHOULDER ARTHROPLASTY;  Surgeon: Meredith Pel, MD;  Location: [place];  Service: Orthopedics;  Laterality: Left;  . REVERSE SHOULDER ARTHROPLASTY Right 02/01/2018  . REVERSE SHOULDER ARTHROPLASTY Right 02/01/2018   Procedure: RIGHT REVERSE SHOULDER ARTHROPLASTY;  Surgeon: Meredith Pel, MD;  Location: [place];  Service: Orthopedics;  Laterality: Right;  . REVISION TOTAL KNEE ARTHROPLASTY Left 03/02/2017   poly liner/notes 03/02/2017  . RIGHT HEART CATH N/A 11/13/2016    Procedure: Right Heart Cath with Cardiomems;  Surgeon: Larey Dresser, MD;  Location: [place] CV LAB;  Service: Cardiovascular;  Laterality: N/A;  . SHOULDER ARTHROSCOPY WITH ROTATOR CUFF REPAIR Right   . SHOULDER OPEN ROTATOR CUFF REPAIR Left 01/2011   [name] 01/29/2011  . TOTAL ABDOMINAL HYSTERECTOMY    . TOTAL KNEE ARTHROPLASTY Left   . TOTAL KNEE ARTHROPLASTY Right 11/18/2012   Procedure: TOTAL KNEE ARTHROPLASTY;  Surgeon: Yvette Rack., MD;  Location: [place];  Service: Orthopedics;  Laterality: Right;  . TUBAL LIGATION    . UPPER GASTROINTESTINAL ENDOSCOPY    . UPPER GI ENDOSCOPY      There were no vitals filed for this visit.   Subjective Assessment - 08/26/20 0817    Subjective  [name] presents to OT for Rx visit 73/108 after 2 week holiday break to address BLE lipo-lymphedema (I89.0). Pt presents wearing BLE custom compression stockings. [name] reports she was able to get compression stockins on daily with assistance as directed, but was only able to use sequenctial pneumatic device ("lymphedema pump") about 1/3 of the time due to limited staffing at her SNF over the holidays. Pt reports increased neck pain this morning. She reports leg pain s unchanged. She reports LLE heaviness and fullness, but does not rate numerically.    Pertinent History chronic BLE leg swelling and associated pain ~ 40 years; hx herniated intervertebral discL TKA; L total knee revision; HTN, Obesity. dementia; restless leg; CHF; unstable anina; R RC arthroplasty, OA, cervical spondylosis with myelopathy, chronic lower back pain, CKD (Stage?); Fibromyalgia; Hypothyroidism; OSA ( cPap?); hx pneumonia; RA, Type II Diabetes. S/P cardiac cathg, B THA, Lumbar laminectomy, posterior lumbar fusion, B shoulder arthroplasty; total abdominal hysterectomy    Limitations chronic leg swelling and associated pain, difficulty walking, impaired dynamic balance, muscle weakness,    Repetition Increases Symptoms    Special  Tests + STEMMER sign base of toes bilaterally                        OT Treatments/Exercises (OP) - 08/26/20 0001      ADLs   ADL Education Given Yes      Manual Therapy   Manual Therapy Edema management;Manual Lymphatic Drainage (MLD);Compression Bandaging    Edema Management skin care to LLE throughout MLD with low ph castor oil to limit infection risk    Manual Lymphatic Drainage (MLD) LLE MLD as established. fibrosis techniqes to distal leg and ankle fat pads    Compression Bandaging Max A to don and doff custom stocking  OT Education - 08/26/20 [phone]    Education Details Continued skilled Pt/caregiver education  And LE ADL training throughout visit for lymphedema self care/ home program, including compression wrapping, compression garment and device wear/care, lymphatic pumping ther ex, simple self-MLD, and skin care. Discussed progress towards goals and OT POC going forward.    Person(s) Educated Patient    Methods Explanation;Demonstration    Comprehension Verbalized understanding;Returned demonstration               OT Long Term Goals - 08/02/20 0949      OT LONG TERM GOAL #1   Title Pt will be able to apply LLE multi-layer, short stretch compression wraps daily with maximum caregiver assistance, using correct gradient techniques independently to return affected limb/s, as closely as possible, to premorbid size and shape, to limit infection risk, and to improve safe functional mobility and ADLs performance.    Baseline dependent    Time 4    Period Days    Status Achieved   Pt has no assistance at SNF for compression wrapping. Pt launders and rolls bandages independently. Pt still requires ongoing verbal cues to lift leg and reposition wheil assistant waraps. 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     OT LONG TERM GOAL #2   Title Pt will be able to verbalize signs and symptoms of cellulitis infection and identify 4 common  lymphedema precautions using printed resource for reference (modified independence) to limit LE progression over time.    Baseline Max A    Time 4    Period Days    Status Achieved      OT LONG TERM GOAL #3   Title Pt will sustain than 85% compliance with all daily LE self-care home program components throughout Intensive Phase CDT, including impeccable skin care, lymphatic pumping therex,  compression wraps and simple self MLD, to ensure optimal limb volume reduction, to limit infection risk and to limit LE progression.    Baseline dependent    Time 12    Period Weeks    Status Achieved   Pt 85% compliant. Staff at SNF not always available to support 85% compliance     OT LONG TERM GOAL #4   Title Pt to achieve at least 10% LLE limb volume reduction during Intensive Phase CDT with Max CG assistance for wrapping, to improve functional performance of basic and instrumental ADLs, and to limit LE progression.   to date LLE reductions: A-D= 15.7%, E-G= 29.63%, A-G= 22.9%. RLE: A-D: 6.7%, E-G= 15.7%, A-G= 11.2%   Baseline dependent    Time 12    Period Weeks    Status On-going      OT LONG TERM GOAL #5   Title During self-management phase of CDT Pt will retain limb volume reductions achieved during Intensive Phase CDT with no more than 3% volume increase using all LE self care components  to limit LE progression and further functional decline.    Baseline dependent    Time 6    Period Months    Status On-going                 Plan - 08/26/20 5277    Clinical Impression Statement Pt lasr seen for LE Rx 2 weeks ago. Pt was able to wear custom knee length compressioin stockings daily as directed over the holiday break, but was ony able to use "pump" apprx "1/3 of the time" due to shortage of available staff assistance at [facility]. Use  of all self-management modalities at recommended frequency is essential for optimal  management of progressive lipo-lymphedema over time. RLE swelling appears  well managed today, while LLE swelling is not as well managed. Swelling is increased from distal knee to ankle  compared with RLE. Tissue density is less dense than expected bilaterally. New skin crease is observed extending from proximal lateral L leg to medial leg at shin. Skin is in excellent condition bilaterally. Pt is doing a nice job with skin care to limit infection risk. Pt currently has 2 pair of custom knee length compression garments. The garments worn today are showing signs of wear. Garments could use more frequent laundering and Pt is washing frequently when she has difficulty w incontinence. Pt in agreement with plan to obtain 2 new pair of custom daytie garments, and with plan to decrease OT frequency to 1 x weekly through April to provide ongoing support for LE self-management.    Occupational performance deficits (Please refer to evaluation for details): Play           Patient will benefit from skilled therapeutic intervention in order to improve the following deficits and impairments:           Visit Diagnosis: Lymphedema, not elsewhere classified    Problem List Patient Active Problem List   Diagnosis Date Noted  . Iron deficiency 08/06/2018  . History of total left knee replacement (TKR) 07/18/2018  . Rectal bleeding 07/05/2018  . Hemorrhoids 07/05/2018  . Anal itching 07/05/2018  . Rotator cuff arthropathy of right shoulder   . Shoulder arthritis 06/15/2017  . Status post revision of total knee replacement, left 04/29/2017  . Polyethylene liner wear following left total knee arthroplasty requiring isolated polyethylene liner exchange ([place]) 03/02/2017  . 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 Unstable angina ([place]) 05/21/2016  . Chronic diastolic heart failure ([place]) 03/29/2016  . Normal coronary arteries 2009 01/14/2015  . Obesity-BMI 40 01/14/2015  . Restless leg 01/14/2015  . Chest pain 01/14/2015  . Back pain 01/14/2015  .  Renal insufficiency 01/14/2015  . Dementia- mild memory issues 01/14/2015  . Occult blood positive stool 12/14/2014  . Anemia 12/14/2014  . Family history of colon cancer 12/14/2014  . Change in bowel habits 12/14/2014  . GERD (gastroesophageal reflux disease) 12/14/2014  . Dyspnea 05/17/2012  . Lymphedema 05/17/2012  . Weight gain 05/17/2012  . HTN (hypertension) 05/17/2012    Andrey Spearman, MS, OTR/L, [facility] 08/26/20 9:36 AM  [place] MAIN [facility] SERVICES [address] [place], [place], [address] Phone: [phone]   Fax:  [phone]  Name: [name] MRN: [id number] Date of Birth: [birth year]

## 2020-08-28 ENCOUNTER — Encounter: Payer: Medicare Other | Admitting: Occupational Therapy

## 2020-08-30 ENCOUNTER — Other Ambulatory Visit: Payer: Self-pay

## 2020-08-30 ENCOUNTER — Ambulatory Visit: Payer: Medicare Other | Admitting: Occupational Therapy

## 2020-08-30 DIAGNOSIS — I890 Lymphedema, not elsewhere classified: Secondary | ICD-10-CM

## 2020-08-30 NOTE — Therapy (Signed)
[place] MAIN [facility] SERVICES [address] [place], [place], [address] Phone: [phone]   Fax:  [phone]  Occupational Therapy Treatment  Patient Details  Name: [name] MRN: [id number] Date of Birth: September 25, 1945 Referring Provider (OT): Juluis Pitch, MD   Encounter Date: 08/30/2020   OT End of Session - 08/30/20 0907    Visit Number 3    Number of Visits 36    Date for OT Re-Evaluation 11/03/20    Authorization Type 74/[id number]    OT Start Time 0800    OT Stop Time 0900    OT Time Calculation (min) 60 min    Equipment Utilized During Treatment friction gloves and tyvek slipper    Activity Tolerance Patient tolerated treatment well;No increased pain   Chronic back pain worse since sliding off of bed when matress slid on frame. Denies leg pain   Behavior During Therapy [facility] for tasks assessed/performed   sleepiness throughout session          Past Medical History:  Diagnosis Date  . Anemia   . Anxiety   . Arthritis    "hands, feet" (03/03/2017)  . Brain lesion    2 types  . Cataract   . Cervical spondylosis with myelopathy   . Chest pain    Normal cardiac cath 5/09  . Chronic lower back pain   . CKD (chronic kidney disease), stage III ([place])    [name]  . Congestive heart failure (CHF) ([place])    has a Cardiomems implant   . Constipation   . Dementia ([place])   . Depression   . Dumping syndrome   . Dyspnea    with activity  . Edema   . Fatigue   . Fibromyalgia   . GERD (gastroesophageal reflux disease)   . Headache    "w/high CBG" (03/03/2017)  . History of echocardiogram    a. Echo 03/20/16 (done at [facility] in [place], [place]):  mild LVH, EF 66%, normal diastolic function, mild LAE, MAC, RVSP 25 mmHg  . Hyperlipidemia   . Hypertension   . Hypothyroidism   . Lumbar spondylosis   . Lymphedema    seeing specialist for this  . Migraine    "mostly stopped when I changed my diet" (03/03/2017)  .  OSA on CPAP   . Pneumonia X 1  . PONV (postoperative nausea and vomiting)   . Restless leg syndrome   . Rheumatoid arthritis ([place])   . Stroke [facility])    TIAs "mini strokes"- unsure of last TIA - pt and daughter deny this  . Syncope   . Tremors of nervous system   . Type II diabetes mellitus ([place])     Past Surgical History:  Procedure Laterality Date  . APPENDECTOMY  1966  . BACK SURGERY    . CARDIAC CATHETERIZATION N/A 05/25/2016   Procedure: Right/Left Heart Cath and Coronary Angiography;  Surgeon: Larey Dresser, MD;  Location: [place] CV LAB;  Service: Cardiovascular;  Laterality: N/A;  . CATARACT EXTRACTION W/ INTRAOCULAR LENS  IMPLANT, BILATERAL Bilateral   . COLONOSCOPY    . CORONARY ANGIOGRAM  2009   Normal coronaries  . EXCISION/RELEASE BURSA HIP Bilateral   . I & D KNEE WITH POLY EXCHANGE Left 03/02/2017   Procedure: Left knee Revision of poly liner;  Surgeon: Mcarthur Rossetti, MD;  Location: [place];  Service: Orthopedics;  Laterality: Left;  . JOINT REPLACEMENT    . KNEE  ARTHROSCOPY Bilateral   . LAPAROSCOPIC CHOLECYSTECTOMY  2015  . LMF  2017   CardiMEMS HF implant for CHF (measures amount of fluid in the heart)  . LUMBAR DISC SURGERY    . LUMBAR LAMINECTOMY/DECOMPRESSION MICRODISCECTOMY Left 12/2005   L2-3 laminectomy and diskectomy/notes 01/06/2011  . POSTERIOR LUMBAR FUSION  04/2006   [name] 01/06/2011; "put cages in"  . REVERSE SHOULDER ARTHROPLASTY Left 06/15/2017   Procedure: REVERSE LEFT SHOULDER ARTHROPLASTY;  Surgeon: Meredith Pel, MD;  Location: [place];  Service: Orthopedics;  Laterality: Left;  . REVERSE SHOULDER ARTHROPLASTY Right 02/01/2018  . REVERSE SHOULDER ARTHROPLASTY Right 02/01/2018   Procedure: RIGHT REVERSE SHOULDER ARTHROPLASTY;  Surgeon: Meredith Pel, MD;  Location: [place];  Service: Orthopedics;  Laterality: Right;  . REVISION TOTAL KNEE ARTHROPLASTY Left 03/02/2017   poly liner/notes 03/02/2017  . RIGHT HEART CATH N/A  11/13/2016   Procedure: Right Heart Cath with Cardiomems;  Surgeon: Larey Dresser, MD;  Location: [place] [facility] LAB;  Service: Cardiovascular;  Laterality: N/A;  . SHOULDER ARTHROSCOPY WITH ROTATOR CUFF REPAIR Right   . SHOULDER OPEN ROTATOR CUFF REPAIR Left 01/2011   [name] 01/29/2011  . TOTAL ABDOMINAL HYSTERECTOMY    . TOTAL KNEE ARTHROPLASTY Left   . TOTAL KNEE ARTHROPLASTY Right 11/18/2012   Procedure: TOTAL KNEE ARTHROPLASTY;  Surgeon: Yvette Rack., MD;  Location: [place];  Service: Orthopedics;  Laterality: Right;  . TUBAL LIGATION    . UPPER GASTROINTESTINAL ENDOSCOPY    . UPPER GI ENDOSCOPY      There were no vitals filed for this visit.   Subjective Assessment - 08/30/20 0865    Subjective  [name] presents to OT for Rx visit 74/108  to address BLE lipo-lymphedema (I89.0). Pt presents wearing BLE custom compression stockings. She denies LE related pain this morning.    Pertinent History chronic BLE leg swelling and associated pain ~ 40 years; hx herniated intervertebral discL TKA; L total knee revision; HTN, Obesity. dementia; restless leg; CHF; unstable anina; R RC arthroplasty, OA, cervical spondylosis with myelopathy, chronic lower back pain, CKD (Stage?); Fibromyalgia; Hypothyroidism; OSA ( cPap?); hx pneumonia; RA, Type II Diabetes. S/P cardiac cathg, B THA, Lumbar laminectomy, posterior lumbar fusion, B shoulder arthroplasty; total abdominal hysterectomy    Limitations chronic leg swelling and associated pain, difficulty walking, impaired dynamic balance, muscle weakness,    Repetition Increases Symptoms    Special Tests + STEMMER sign base of toes bilaterally                        OT Treatments/Exercises (OP) - 08/30/20 0001      ADLs   ADL Education Given Yes      Manual Therapy   Manual Therapy Edema management    Edema Management skin care to LLE throughout MLD with low ph castor oil to limit infection risk    Manual Lymphatic Drainage (MLD) LLE  MLD as established. fibrosis techniqes to distal leg and ankle fat pads    Compression Bandaging Max A to don and doff custom stocking                  OT Education - 08/30/20 0907    Education Details Continued skilled Pt/caregiver education  And LE ADL training throughout visit for lymphedema self care/ home program, including compression wrapping, compression garment and device wear/care, lymphatic pumping ther ex, simple self-MLD, and skin care. Discussed progress towards goals and OT POC  going forward.    Person(s) Educated Patient    Methods Explanation;Demonstration    Comprehension Verbalized understanding;Returned demonstration               OT Long Term Goals - 08/02/20 0949      OT LONG TERM GOAL #1   Title Pt will be able to apply LLE multi-layer, short stretch compression wraps daily with maximum caregiver assistance, using correct gradient techniques independently to return affected limb/s, as closely as possible, to premorbid size and shape, to limit infection risk, and to improve safe functional mobility and ADLs performance.    Baseline dependent    Time 4    Period Days    Status Achieved   Pt has no assistance at SNF for compression wrapping. Pt launders and rolls bandages independently. Pt still requires ongoing verbal cues to lift leg and reposition wheil assistant waraps. 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     OT LONG TERM GOAL #2   Title Pt will be able to verbalize signs and symptoms of cellulitis infection and identify 4 common lymphedema precautions using printed resource for reference (modified independence) to limit LE progression over time.    Baseline Max A    Time 4    Period Days    Status Achieved      OT LONG TERM GOAL #3   Title Pt will sustain than 85% compliance with all daily LE self-care home program components throughout Intensive Phase CDT, including impeccable skin care, lymphatic pumping therex,  compression wraps and simple  self MLD, to ensure optimal limb volume reduction, to limit infection risk and to limit LE progression.    Baseline dependent    Time 12    Period Weeks    Status Achieved   Pt 85% compliant. Staff at SNF not always available to support 85% compliance     OT LONG TERM GOAL #4   Title Pt to achieve at least 10% LLE limb volume reduction during Intensive Phase CDT with Max CG assistance for wrapping, to improve functional performance of basic and instrumental ADLs, and to limit LE progression.   to date LLE reductions: A-D= 15.7%, E-G= 29.63%, A-G= 22.9%. RLE: A-D: 6.7%, E-G= 15.7%, A-G= 11.2%   Baseline dependent    Time 12    Period Weeks    Status On-going      OT LONG TERM GOAL #5   Title During self-management phase of CDT Pt will retain limb volume reductions achieved during Intensive Phase CDT with no more than 3% volume increase using all LE self care components  to limit LE progression and further functional decline.    Baseline dependent    Time 6    Period Months    Status On-going                 Plan - 08/30/20 0907    Clinical Impression Statement Pt tolerated LLE MLD, skin care and compression theray without increased pain. Increased bLE swelling observed again ankle to distal thigh despite reported compliance with compression garments and devices and use of diuretic as directed daily since last  visit. Cont as per POC.           Patient will benefit from skilled therapeutic intervention in order to improve the following deficits and impairments:           Visit Diagnosis: Lymphedema, not elsewhere classified    Problem List Patient Active Problem List   Diagnosis Date  Noted  . Iron deficiency 08/06/2018  . History of total left knee replacement (TKR) 07/18/2018  . Rectal bleeding 07/05/2018  . Hemorrhoids 07/05/2018  . Anal itching 07/05/2018  . Rotator cuff arthropathy of right shoulder   . Shoulder arthritis 06/15/2017  . Status post revision  of total knee replacement, left 04/29/2017  . Polyethylene liner wear following left total knee arthroplasty requiring isolated polyethylene liner exchange ([place]) 03/02/2017  . 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 Unstable angina ([place]) 05/21/2016  . Chronic diastolic heart failure ([place]) 03/29/2016  . Normal coronary arteries 2009 01/14/2015  . Obesity-BMI 40 01/14/2015  . Restless leg 01/14/2015  . Chest pain 01/14/2015  . Back pain 01/14/2015  . Renal insufficiency 01/14/2015  . Dementia- mild memory issues 01/14/2015  . Occult blood positive stool 12/14/2014  . Anemia 12/14/2014  . Family history of colon cancer 12/14/2014  . Change in bowel habits 12/14/2014  . GERD (gastroesophageal reflux disease) 12/14/2014  . Dyspnea 05/17/2012  . Lymphedema 05/17/2012  . Weight gain 05/17/2012  . HTN (hypertension) 05/17/2012    Andrey Spearman, MS, OTR/L, [facility] 08/30/20 10:15 AM  [place] MAIN [facility] SERVICES [address] [place], [place], [address] Phone: [phone]   Fax:  [phone]  Name: ELYSA WOMAC MRN: [id number] Date of Birth: 1945-09-30

## 2020-09-02 ENCOUNTER — Other Ambulatory Visit: Payer: Self-pay

## 2020-09-02 ENCOUNTER — Ambulatory Visit: Payer: Medicare Other | Admitting: Occupational Therapy

## 2020-09-02 DIAGNOSIS — I890 Lymphedema, not elsewhere classified: Secondary | ICD-10-CM

## 2020-09-02 NOTE — Therapy (Signed)
[place] MAIN [facility] SERVICES [address] [place], [place], [address] Phone: [phone]   Fax:  [phone]  Occupational Therapy Treatment  Patient Details  Name: [name] MRN: [id number] Date of Birth: 09-13-1945 Referring Provider (OT): Juluis Pitch, MD   Encounter Date: 09/02/2020   OT End of Session - 09/02/20 1222    Visit Number 22    Number of Visits 108    Date for OT Re-Evaluation 11/03/20    OT Start Time 0802    OT Stop Time 0910    OT Time Calculation (min) 68 min    Equipment Utilized During Treatment friction gloves and tyvek slipper    Activity Tolerance Patient tolerated treatment well;No increased [name]   Chronic back [name] worse since sliding off of bed when matress slid on frame. Denies leg [name]   Behavior During Therapy [facility] for tasks assessed/performed   sleepiness throughout session          Past Medical History:  Diagnosis Date  . Anemia   . Anxiety   . Arthritis    "hands, feet" (03/03/2017)  . Brain lesion    2 types  . Cataract   . Cervical spondylosis with myelopathy   . Chest [name]    Normal cardiac cath 5/09  . Chronic lower back [name]   . CKD (chronic kidney disease), stage III ([place])    [name]  . Congestive heart failure (CHF) ([place])    has a Cardiomems implant   . Constipation   . Dementia ([place])   . Depression   . Dumping syndrome   . Dyspnea    with activity  . Edema   . Fatigue   . Fibromyalgia   . GERD (gastroesophageal reflux disease)   . Headache    "w/high CBG" (03/03/2017)  . History of echocardiogram    a. Echo 03/20/16 (done at [facility] in [name], [place]):  mild LVH, EF 69%, normal diastolic function, mild LAE, MAC, RVSP 25 mmHg  . Hyperlipidemia   . Hypertension   . Hypothyroidism   . Lumbar spondylosis   . Lymphedema    seeing specialist for this  . Migraine    "mostly stopped when I changed my diet" (03/03/2017)  . OSA on CPAP   . Pneumonia  X 1  . PONV (postoperative nausea and vomiting)   . Restless leg syndrome   . Rheumatoid arthritis ([place])   . Stroke [facility])    TIAs "mini strokes"- unsure of last TIA - pt and daughter deny this  . Syncope   . Tremors of nervous system   . Type II diabetes mellitus ([place])     Past Surgical History:  Procedure Laterality Date  . APPENDECTOMY  1966  . BACK SURGERY    . CARDIAC CATHETERIZATION N/A 05/25/2016   Procedure: Right/Left Heart Cath and Coronary Angiography;  Surgeon: Larey Dresser, MD;  Location: [place] [facility];  Service: Cardiovascular;  Laterality: N/A;  . CATARACT EXTRACTION W/ INTRAOCULAR LENS  IMPLANT, BILATERAL Bilateral   . COLONOSCOPY    . CORONARY ANGIOGRAM  2009   Normal coronaries  . EXCISION/RELEASE BURSA HIP Bilateral   . I & D KNEE WITH POLY EXCHANGE Left 03/02/2017   Procedure: Left knee Revision of poly liner;  Surgeon: Mcarthur Rossetti, MD;  Location: [place];  Service: Orthopedics;  Laterality: Left;  . JOINT REPLACEMENT    . KNEE ARTHROSCOPY Bilateral   . LAPAROSCOPIC  CHOLECYSTECTOMY  2015  . LMF  2017   CardiMEMS HF implant for CHF (measures amount of fluid in the heart)  . LUMBAR DISC SURGERY    . LUMBAR LAMINECTOMY/DECOMPRESSION MICRODISCECTOMY Left 12/2005   L2-3 laminectomy and diskectomy/notes 01/06/2011  . POSTERIOR LUMBAR FUSION  04/2006   [name] 01/06/2011; "put cages in"  . REVERSE SHOULDER ARTHROPLASTY Left 06/15/2017   Procedure: REVERSE LEFT SHOULDER ARTHROPLASTY;  Surgeon: Meredith Pel, MD;  Location: [place];  Service: Orthopedics;  Laterality: Left;  . REVERSE SHOULDER ARTHROPLASTY Right 02/01/2018  . REVERSE SHOULDER ARTHROPLASTY Right 02/01/2018   Procedure: RIGHT REVERSE SHOULDER ARTHROPLASTY;  Surgeon: Meredith Pel, MD;  Location: [place];  Service: Orthopedics;  Laterality: Right;  . REVISION TOTAL KNEE ARTHROPLASTY Left 03/02/2017   poly liner/notes 03/02/2017  . RIGHT HEART CATH N/A 11/13/2016   Procedure: Right  Heart Cath with Cardiomems;  Surgeon: Larey Dresser, MD;  Location: [place] [facility];  Service: Cardiovascular;  Laterality: N/A;  . SHOULDER ARTHROSCOPY WITH ROTATOR CUFF REPAIR Right   . SHOULDER OPEN ROTATOR CUFF REPAIR Left 01/2011   [name] 01/29/2011  . TOTAL ABDOMINAL HYSTERECTOMY    . TOTAL KNEE ARTHROPLASTY Left   . TOTAL KNEE ARTHROPLASTY Right 11/18/2012   Procedure: TOTAL KNEE ARTHROPLASTY;  Surgeon: Yvette Rack., MD;  Location: [place];  Service: Orthopedics;  Laterality: Right;  . TUBAL LIGATION    . UPPER GASTROINTESTINAL ENDOSCOPY    . UPPER GI ENDOSCOPY      There were no vitals filed for this visit.   Subjective Assessment - 09/02/20 1218    Subjective  [name] presents to OT for Rx visit 74/108  to address BLE lipo-lymphedema (I89.0). Pt brings custom compression stockings to clinic as staff at SNF did not assist w donning this morning. . She denies LE related [name] this morning. Pt endorses L neck and shoulder [name], but does not rate numerically. Pt used moist heat pack to site throughoutt session in an effort to reduce [name] and facilitate relaxation for manual therapy.    Pertinent History chronic BLE leg swelling and associated [name] ~ 40 years; hx herniated intervertebral discL TKA; L total knee revision; HTN, Obesity. dementia; restless leg; CHF; unstable anina; R RC arthroplasty, OA, cervical spondylosis with myelopathy, chronic lower back [name], CKD (Stage?); Fibromyalgia; Hypothyroidism; OSA ( cPap?); hx pneumonia; RA, Type II Diabetes. S/P cardiac cathg, B THA, Lumbar laminectomy, posterior lumbar fusion, B shoulder arthroplasty; total abdominal hysterectomy    Limitations chronic leg swelling and associated [name], difficulty walking, impaired dynamic balance, muscle weakness,    Repetition Increases Symptoms    Special Tests + STEMMER sign base of toes bilaterally                        OT Treatments/Exercises (OP) - 09/02/20 0001      ADLs   ADL  Education Given Yes      Manual Therapy   Manual Therapy Edema management;Manual Lymphatic Drainage (MLD);Compression Bandaging    Edema Management skin care to LLE throughout MLD with low ph castor oil to limit infection risk    Manual Lymphatic Drainage (MLD) LLE MLD as established. fibrosis techniqes to distal leg and ankle fat pads    Compression Bandaging Max A to don and doff custom stocking                  OT Education - 09/02/20 1221  Education Details Continued skilled Pt/caregiver education  And LE ADL training throughout visit for lymphedema self care/ home program, including compression wrapping, compression garment and device wear/care, lymphatic pumping ther ex, simple self-MLD, and skin care. Discussed progress towards goals and OT POC going forward.    Person(s) Educated Patient    Methods Explanation;Demonstration    Comprehension Verbalized understanding;Returned demonstration               OT Long Term Goals - 08/02/20 0949      OT LONG TERM GOAL #1   Title Pt will be able to apply LLE multi-layer, short stretch compression wraps daily with maximum caregiver assistance, using correct gradient techniques independently to return affected limb/s, as closely as possible, to premorbid size and shape, to limit infection risk, and to improve safe functional mobility and ADLs performance.    Baseline dependent    Time 4    Period Days    Status Achieved   Pt has no assistance at SNF for compression wrapping. Pt launders and rolls bandages independently. Pt still requires ongoing verbal cues to lift leg and reposition wheil assistant waraps. 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     OT LONG TERM GOAL #2   Title Pt will be able to verbalize signs and symptoms of cellulitis infection and identify 4 common lymphedema precautions using printed resource for reference (modified independence) to limit LE progression over time.    Baseline Max A    Time 4    Period  Days    Status Achieved      OT LONG TERM GOAL #3   Title Pt will sustain than 85% compliance with all daily LE self-care home program components throughout Intensive Phase CDT, including impeccable skin care, lymphatic pumping therex,  compression wraps and simple self MLD, to ensure optimal limb volume reduction, to limit infection risk and to limit LE progression.    Baseline dependent    Time 12    Period Weeks    Status Achieved   Pt 85% compliant. Staff at SNF not always available to support 85% compliance     OT LONG TERM GOAL #4   Title Pt to achieve at least 10% LLE limb volume reduction during Intensive Phase CDT with Max CG assistance for wrapping, to improve functional performance of basic and instrumental ADLs, and to limit LE progression.   to date LLE reductions: A-D= 15.7%, E-G= 29.63%, A-G= 22.9%. RLE: A-D: 6.7%, E-G= 15.7%, A-G= 11.2%   Baseline dependent    Time 12    Period Weeks    Status On-going      OT LONG TERM GOAL #5   Title During self-management phase of CDT Pt will retain limb volume reductions achieved during Intensive Phase CDT with no more than 3% volume increase using all LE self care components  to limit LE progression and further functional decline.    Baseline dependent    Time 6    Period Months    Status On-going                 Plan - 09/02/20 1223    Clinical Impression Statement LLE swelling from ankle to groin swelling mildly elevated again today compared with optimal level of volume reduction achieved at end of INtensive Phase CDT. Pt reportsSNF staff is less consistent with assisting her with donning compression garments in the morning at present  due to staff shortages and because many staff are "agency" so they  are less familiar with how to don custom compression garments, HOS devices , and assisting her to use sequential pneumatic device. Plan is to commence compression wrapping to LLE, even with decreased visit frequency in effort to  reduce swelling back to goal range and keep it there to limit progressioin and elevated infection risk. Pt tolerated MLD, skin care and wraps to LLE today as established. Reduce OT frequency to 1 x weekly after this week.           Patient will benefit from skilled therapeutic intervention in order to improve the following deficits and impairments:           Visit Diagnosis: Lymphedema, not elsewhere classified    Problem List Patient Active Problem List   Diagnosis Date Noted  . Iron deficiency 08/06/2018  . History of total left knee replacement (TKR) 07/18/2018  . Rectal bleeding 07/05/2018  . Hemorrhoids 07/05/2018  . Anal itching 07/05/2018  . Rotator cuff arthropathy of right shoulder   . Shoulder arthritis 06/15/2017  . Status post revision of total knee replacement, left 04/29/2017  . Polyethylene liner wear following left total knee arthroplasty requiring isolated polyethylene liner exchange ([place]) 03/02/2017  . 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 Unstable angina ([place]) 05/21/2016  . Chronic diastolic heart failure ([place]) 03/29/2016  . Normal coronary arteries 2009 01/14/2015  . Obesity-BMI 40 01/14/2015  . Restless leg 01/14/2015  . Chest [name] 01/14/2015  . Back [name] 01/14/2015  . Renal insufficiency 01/14/2015  . Dementia- mild memory issues 01/14/2015  . Occult blood positive stool 12/14/2014  . Anemia 12/14/2014  . Family history of colon cancer 12/14/2014  . Change in bowel habits 12/14/2014  . GERD (gastroesophageal reflux disease) 12/14/2014  . Dyspnea 05/17/2012  . Lymphedema 05/17/2012  . Weight gain 05/17/2012  . HTN (hypertension) 05/17/2012    Andrey Spearman, MS, OTR/L, [facility] 09/02/20 12:31 PM  [place] MAIN [facility] SERVICES [address] [place], [place], [address] Phone: [phone]   Fax:  [phone]  Name: HIAWATHA DRESSEL MRN: [id number] Date of Birth:  11/09/1945

## 2020-09-04 ENCOUNTER — Encounter: Payer: Medicare Other | Admitting: Occupational Therapy

## 2020-09-06 ENCOUNTER — Ambulatory Visit: Payer: Medicare Other | Admitting: Occupational Therapy

## 2020-09-09 ENCOUNTER — Ambulatory Visit: Payer: Medicare Other | Admitting: Occupational Therapy

## 2020-09-11 ENCOUNTER — Encounter: Payer: Medicare Other | Admitting: Occupational Therapy

## 2020-09-12 ENCOUNTER — Other Ambulatory Visit: Payer: Self-pay

## 2020-09-12 ENCOUNTER — Ambulatory Visit: Payer: Medicare Other | Admitting: Occupational Therapy

## 2020-09-12 DIAGNOSIS — I890 Lymphedema, not elsewhere classified: Secondary | ICD-10-CM

## 2020-09-12 NOTE — Therapy (Signed)
[place] MAIN [facility] SERVICES [address] [place], [place], [address] Phone: [phone]   Fax:  [phone]  Occupational Therapy Treatment  Patient Details  Name: [name] MRN: [id number] Date of Birth: 1946-03-09 Referring Provider (OT): [name] Pitch, MD   Encounter Date: 09/12/2020   OT End of Session - 09/12/20 0813    Visit Number 24    Number of Visits 108    Date for OT Re-Evaluation 11/03/20    OT Start Time 0800    OT Stop Time 0915    OT Time Calculation (min) 75 min    Equipment Utilized During Treatment friction gloves and tyvek slipper    Activity Tolerance Patient tolerated treatment well;No increased pain   Chronic back pain worse since sliding off of bed when matress slid on frame. Denies leg pain   Behavior During Therapy [facility] for tasks assessed/performed   sleepiness throughout session          Past Medical History:  Diagnosis Date  . Anemia   . Anxiety   . Arthritis    "hands, feet" (03/03/2017)  . Brain lesion    2 types  . Cataract   . Cervical spondylosis with myelopathy   . Chest pain    Normal cardiac cath 5/09  . Chronic lower back pain   . CKD (chronic kidney disease), stage III ([place])    [name]  . Congestive heart failure (CHF) ([place])    has a Cardiomems implant   . Constipation   . Dementia ([place])   . Depression   . Dumping syndrome   . Dyspnea    with activity  . Edema   . Fatigue   . Fibromyalgia   . GERD (gastroesophageal reflux disease)   . Headache    "w/high CBG" (03/03/2017)  . History of echocardiogram    a. Echo 03/20/16 (done at [facility] in [place], [place]):  mild LVH, EF 68%, normal diastolic function, mild LAE, MAC, RVSP 25 mmHg  . Hyperlipidemia   . Hypertension   . Hypothyroidism   . Lumbar spondylosis   . Lymphedema    seeing specialist for this  . Migraine    "mostly stopped when I changed my diet" (03/03/2017)  . OSA on CPAP   . Pneumonia  X 1  . PONV (postoperative nausea and vomiting)   . Restless leg syndrome   . Rheumatoid arthritis ([place])   . Stroke [facility])    TIAs "mini strokes"- unsure of last TIA - pt and daughter deny this  . Syncope   . Tremors of nervous system   . Type II diabetes mellitus ([place])     Past Surgical History:  Procedure Laterality Date  . APPENDECTOMY  1966  . BACK SURGERY    . CARDIAC CATHETERIZATION N/A 05/25/2016   Procedure: Right/Left Heart Cath and Coronary Angiography;  Surgeon: Larey Dresser, MD;  Location: [place] [facility];  Service: Cardiovascular;  Laterality: N/A;  . CATARACT EXTRACTION W/ INTRAOCULAR LENS  IMPLANT, BILATERAL Bilateral   . COLONOSCOPY    . CORONARY ANGIOGRAM  2009   Normal coronaries  . EXCISION/RELEASE BURSA HIP Bilateral   . I & D KNEE WITH POLY EXCHANGE Left 03/02/2017   Procedure: Left knee Revision of poly liner;  Surgeon: Mcarthur Rossetti, MD;  Location: [place];  Service: Orthopedics;  Laterality: Left;  . JOINT REPLACEMENT    . KNEE ARTHROSCOPY Bilateral   . LAPAROSCOPIC  CHOLECYSTECTOMY  2015  . LMF  2017   CardiMEMS HF implant for CHF (measures amount of fluid in the heart)  . LUMBAR DISC SURGERY    . LUMBAR LAMINECTOMY/DECOMPRESSION MICRODISCECTOMY Left 12/2005   L2-3 laminectomy and diskectomy/notes 01/06/2011  . POSTERIOR LUMBAR FUSION  04/2006   [name] 01/06/2011; "put cages in"  . REVERSE SHOULDER ARTHROPLASTY Left 06/15/2017   Procedure: REVERSE LEFT SHOULDER ARTHROPLASTY;  Surgeon: Meredith Pel, MD;  Location: [place];  Service: Orthopedics;  Laterality: Left;  . REVERSE SHOULDER ARTHROPLASTY Right 02/01/2018  . REVERSE SHOULDER ARTHROPLASTY Right 02/01/2018   Procedure: RIGHT REVERSE SHOULDER ARTHROPLASTY;  Surgeon: Meredith Pel, MD;  Location: [place];  Service: Orthopedics;  Laterality: Right;  . REVISION TOTAL KNEE ARTHROPLASTY Left 03/02/2017   poly liner/notes 03/02/2017  . RIGHT HEART CATH N/A 11/13/2016   Procedure: Right  Heart Cath with Cardiomems;  Surgeon: Larey Dresser, MD;  Location: [place] [facility];  Service: Cardiovascular;  Laterality: N/A;  . SHOULDER ARTHROSCOPY WITH ROTATOR CUFF REPAIR Right   . SHOULDER OPEN ROTATOR CUFF REPAIR Left 01/2011   [name] 01/29/2011  . TOTAL ABDOMINAL HYSTERECTOMY    . TOTAL KNEE ARTHROPLASTY Left   . TOTAL KNEE ARTHROPLASTY Right 11/18/2012   Procedure: TOTAL KNEE ARTHROPLASTY;  Surgeon: Yvette Rack., MD;  Location: [place];  Service: Orthopedics;  Laterality: Right;  . TUBAL LIGATION    . UPPER GASTROINTESTINAL ENDOSCOPY    . UPPER GI ENDOSCOPY      There were no vitals filed for this visit.   Subjective Assessment - 09/12/20 0759    Subjective  [name] presents to OT for Rx visit 75/108  to address BLE lipo-lymphedema (I89.0). Pt was last seen for OT on 09/02/20. Pt brings custom compression stockings to clinic as staff at SNF did not have assistance available this morning. Pt denies LE related leg pain this morning.    Pertinent History chronic BLE leg swelling and associated pain ~ 40 years; hx herniated intervertebral discL TKA; L total knee revision; HTN, Obesity. dementia; restless leg; CHF; unstable anina; R RC arthroplasty, OA, cervical spondylosis with myelopathy, chronic lower back pain, CKD (Stage?); Fibromyalgia; Hypothyroidism; OSA ( cPap?); hx pneumonia; RA, Type II Diabetes. S/P cardiac cathg, B THA, Lumbar laminectomy, posterior lumbar fusion, B shoulder arthroplasty; total abdominal hysterectomy    Limitations chronic leg swelling and associated pain, difficulty walking, impaired dynamic balance, muscle weakness,    Repetition Increases Symptoms    Special Tests + STEMMER sign base of toes bilaterally                        OT Treatments/Exercises (OP) - 09/12/20 0001      ADLs   ADL Education Given Yes      Manual Therapy   Manual Therapy Edema management;Manual Lymphatic Drainage (MLD);Compression Bandaging    Edema  Management skin care to LLE throughout MLD with low ph castor oil to limit infection risk    Manual Lymphatic Drainage (MLD) LLE MLD as established. fibrosis techniqes to distal leg and ankle fat pads    Compression Bandaging Max A to don and doff bilateral custom stockings                  OT Education - 09/12/20 0809    Education Details Continued skilled Pt/caregiver education  And LE ADL training throughout visit for lymphedema self care/ home program, including compression wrapping, compression garment  and device wear/care, lymphatic pumping ther ex, simple self-MLD, and skin care. Discussed progress towards goals and OT POC going forward.    Person(s) Educated Patient    Methods Explanation;Demonstration    Comprehension Verbalized understanding;Returned demonstration               OT Long Term Goals - 08/02/20 0949      OT LONG TERM GOAL #1   Title Pt will be able to apply LLE multi-layer, short stretch compression wraps daily with maximum caregiver assistance, using correct gradient techniques independently to return affected limb/s, as closely as possible, to premorbid size and shape, to limit infection risk, and to improve safe functional mobility and ADLs performance.    Baseline dependent    Time 4    Period Days    Status Achieved   Pt has no assistance at SNF for compression wrapping. Pt launders and rolls bandages independently. Pt still requires ongoing verbal cues to lift leg and reposition wheil assistant waraps. 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     OT LONG TERM GOAL #2   Title Pt will be able to verbalize signs and symptoms of cellulitis infection and identify 4 common lymphedema precautions using printed resource for reference (modified independence) to limit LE progression over time.    Baseline Max A    Time 4    Period Days    Status Achieved      OT LONG TERM GOAL #3   Title Pt will sustain than 85% compliance with all daily LE self-care  home program components throughout Intensive Phase CDT, including impeccable skin care, lymphatic pumping therex,  compression wraps and simple self MLD, to ensure optimal limb volume reduction, to limit infection risk and to limit LE progression.    Baseline dependent    Time 12    Period Weeks    Status Achieved   Pt 85% compliant. Staff at SNF not always available to support 85% compliance     OT LONG TERM GOAL #4   Title Pt to achieve at least 10% LLE limb volume reduction during Intensive Phase CDT with Max CG assistance for wrapping, to improve functional performance of basic and instrumental ADLs, and to limit LE progression.   to date LLE reductions: A-D= 15.7%, E-G= 29.63%, A-G= 22.9%. RLE: A-D: 6.7%, E-G= 15.7%, A-G= 11.2%   Baseline dependent    Time 12    Period Weeks    Status On-going      OT LONG TERM GOAL #5   Title During self-management phase of CDT Pt will retain limb volume reductions achieved during Intensive Phase CDT with no more than 3% volume increase using all LE self care components  to limit LE progression and further functional decline.    Baseline dependent    Time 6    Period Months    Status On-going                 Plan - 09/12/20 7588    Clinical Impression Statement BLE swelling increased today compared with last visit 10 days ago. Pt not getting assistance with compression garments consistently when SNF staff levels low. Without consistent compression for day and HOS Pt will lose clinical gains achieved during Intensive CDT and she will experience further functional decline and increased infection risk. Pt tolerated LLE MLD and compression stockings donned in clinic today. Plan to resume compression wraps to treated leg at clinical visits to better manage swelling at current 1  x weekly visit frequency since assistance between visits is apparently inconsistent. Cont as per POC.           Patient will benefit from skilled therapeutic intervention  in order to improve the following deficits and impairments:           Visit Diagnosis: Lymphedema, not elsewhere classified    Problem List Patient Active Problem List   Diagnosis Date Noted  . Iron deficiency 08/06/2018  . History of total left knee replacement (TKR) 07/18/2018  . Rectal bleeding 07/05/2018  . Hemorrhoids 07/05/2018  . Anal itching 07/05/2018  . Rotator cuff arthropathy of right shoulder   . Shoulder arthritis 06/15/2017  . Status post revision of total knee replacement, left 04/29/2017  . Polyethylene liner wear following left total knee arthroplasty requiring isolated polyethylene liner exchange ([place]) 03/02/2017  . 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 Unstable angina ([place]) 05/21/2016  . Chronic diastolic heart failure ([place]) 03/29/2016  . Normal coronary arteries 2009 01/14/2015  . Obesity-BMI 40 01/14/2015  . Restless leg 01/14/2015  . Chest pain 01/14/2015  . Back pain 01/14/2015  . Renal insufficiency 01/14/2015  . Dementia- mild memory issues 01/14/2015  . Occult blood positive stool 12/14/2014  . Anemia 12/14/2014  . Family history of colon cancer 12/14/2014  . Change in bowel habits 12/14/2014  . GERD (gastroesophageal reflux disease) 12/14/2014  . Dyspnea 05/17/2012  . Lymphedema 05/17/2012  . Weight gain 05/17/2012  . HTN (hypertension) 05/17/2012    Andrey Spearman, MS, OTR/L, [facility] 09/12/20 10:02 AM   [place] MAIN [facility] SERVICES [address] [place], [place], [address] Phone: [phone]   Fax:  [phone]  Name: [name] MRN: [id number] Date of Birth: June 24, 1946

## 2020-09-13 ENCOUNTER — Encounter: Payer: Medicare Other | Admitting: Occupational Therapy

## 2020-09-16 ENCOUNTER — Ambulatory Visit: Payer: Medicare Other | Admitting: Occupational Therapy

## 2020-09-18 ENCOUNTER — Encounter: Payer: Medicare Other | Admitting: Occupational Therapy

## 2020-09-18 ENCOUNTER — Other Ambulatory Visit: Payer: Self-pay

## 2020-09-18 ENCOUNTER — Ambulatory Visit: Payer: Medicare Other | Admitting: Occupational Therapy

## 2020-09-18 DIAGNOSIS — I890 Lymphedema, not elsewhere classified: Secondary | ICD-10-CM

## 2020-09-18 NOTE — Therapy (Signed)
[place] MAIN [facility] SERVICES [address] [place], [place], [address] Phone: [phone]   Fax:  [phone]  Occupational Therapy Treatment  Patient Details  Name: [name] MRN: [id number] Date of Birth: 1946-06-23 Referring Provider (OT): Juluis Pitch, MD   Encounter Date: 09/18/2020   OT End of Session - 09/18/20 0759    Visit Number 42    Number of Visits 108    Date for OT Re-Evaluation 11/03/20    OT Start Time 0800    OT Stop Time 0905    OT Time Calculation (min) 65 min    Equipment Utilized During Treatment friction gloves and tyvek slipper    Activity Tolerance Patient tolerated treatment well;No increased pain   Chronic back pain worse since sliding off of bed when matress slid on frame. Denies leg pain   Behavior During Therapy [facility] for tasks assessed/performed   sleepiness throughout session          Past Medical History:  Diagnosis Date  . Anemia   . Anxiety   . Arthritis    "hands, feet" (03/03/2017)  . Brain lesion    2 types  . Cataract   . Cervical spondylosis with myelopathy   . Chest pain    Normal cardiac cath 5/09  . Chronic lower back pain   . CKD (chronic kidney disease), stage III ([place])    [name]  . Congestive heart failure (CHF) ([place])    has a Cardiomems implant   . Constipation   . Dementia ([place])   . Depression   . Dumping syndrome   . Dyspnea    with activity  . Edema   . Fatigue   . Fibromyalgia   . GERD (gastroesophageal reflux disease)   . Headache    "w/high CBG" (03/03/2017)  . History of echocardiogram    a. Echo 03/20/16 (done at [facility] in [place], [place]):  mild LVH, EF 38%, normal diastolic function, mild LAE, MAC, RVSP 25 mmHg  . Hyperlipidemia   . Hypertension   . Hypothyroidism   . Lumbar spondylosis   . Lymphedema    seeing specialist for this  . Migraine    "mostly stopped when I changed my diet" (03/03/2017)  . OSA on CPAP   . Pneumonia  X 1  . PONV (postoperative nausea and vomiting)   . Restless leg syndrome   . Rheumatoid arthritis ([place])   . Stroke [facility])    TIAs "mini strokes"- unsure of last TIA - pt and daughter deny this  . Syncope   . Tremors of nervous system   . Type II diabetes mellitus ([place])     Past Surgical History:  Procedure Laterality Date  . APPENDECTOMY  1966  . BACK SURGERY    . CARDIAC CATHETERIZATION N/A 05/25/2016   Procedure: Right/Left Heart Cath and Coronary Angiography;  Surgeon: Larey Dresser, MD;  Location: [place] CV LAB;  Service: Cardiovascular;  Laterality: N/A;  . CATARACT EXTRACTION W/ INTRAOCULAR LENS  IMPLANT, BILATERAL Bilateral   . COLONOSCOPY    . CORONARY ANGIOGRAM  2009   Normal coronaries  . EXCISION/RELEASE BURSA HIP Bilateral   . I & D KNEE WITH POLY EXCHANGE Left 03/02/2017   Procedure: Left knee Revision of poly liner;  Surgeon: Mcarthur Rossetti, MD;  Location: [place];  Service: Orthopedics;  Laterality: Left;  . JOINT REPLACEMENT    . KNEE ARTHROSCOPY Bilateral   . LAPAROSCOPIC  CHOLECYSTECTOMY  2015  . LMF  2017   CardiMEMS HF implant for CHF (measures amount of fluid in the heart)  . LUMBAR DISC SURGERY    . LUMBAR LAMINECTOMY/DECOMPRESSION MICRODISCECTOMY Left 12/2005   L2-3 laminectomy and diskectomy/notes 01/06/2011  . POSTERIOR LUMBAR FUSION  04/2006   [name] 01/06/2011; "put cages in"  . REVERSE SHOULDER ARTHROPLASTY Left 06/15/2017   Procedure: REVERSE LEFT SHOULDER ARTHROPLASTY;  Surgeon: Meredith Pel, MD;  Location: [place];  Service: Orthopedics;  Laterality: Left;  . REVERSE SHOULDER ARTHROPLASTY Right 02/01/2018  . REVERSE SHOULDER ARTHROPLASTY Right 02/01/2018   Procedure: RIGHT REVERSE SHOULDER ARTHROPLASTY;  Surgeon: Meredith Pel, MD;  Location: [place];  Service: Orthopedics;  Laterality: Right;  . REVISION TOTAL KNEE ARTHROPLASTY Left 03/02/2017   poly liner/notes 03/02/2017  . RIGHT HEART CATH N/A 11/13/2016   Procedure: Right  Heart Cath with Cardiomems;  Surgeon: Larey Dresser, MD;  Location: [place] CV LAB;  Service: Cardiovascular;  Laterality: N/A;  . SHOULDER ARTHROSCOPY WITH ROTATOR CUFF REPAIR Right   . SHOULDER OPEN ROTATOR CUFF REPAIR Left 01/2011   [name] 01/29/2011  . TOTAL ABDOMINAL HYSTERECTOMY    . TOTAL KNEE ARTHROPLASTY Left   . TOTAL KNEE ARTHROPLASTY Right 11/18/2012   Procedure: TOTAL KNEE ARTHROPLASTY;  Surgeon: Yvette Rack., MD;  Location: [place];  Service: Orthopedics;  Laterality: Right;  . TUBAL LIGATION    . UPPER GASTROINTESTINAL ENDOSCOPY    . UPPER GI ENDOSCOPY      There were no vitals filed for this visit.   Subjective Assessment - 09/18/20 0815    Subjective  [name] presents to OT for Rx visit 78/108  to address BLE lipo-lymphedema (I89.0).Pt presents wearing custom compression stockings to clinic this morning.  Pt reports "aching, throbbing, sensitive" pain in L leg this morning. "When I have my hose on I'm better off with it because it's a barrier."    Pertinent History chronic BLE leg swelling and associated pain ~ 40 years; hx herniated intervertebral discL TKA; L total knee revision; HTN, Obesity. dementia; restless leg; CHF; unstable anina; R RC arthroplasty, OA, cervical spondylosis with myelopathy, chronic lower back pain, CKD (Stage?); Fibromyalgia; Hypothyroidism; OSA ( cPap?); hx pneumonia; RA, Type II Diabetes. S/P cardiac cathg, B THA, Lumbar laminectomy, posterior lumbar fusion, B shoulder arthroplasty; total abdominal hysterectomy    Limitations chronic leg swelling and associated pain, difficulty walking, impaired dynamic balance, muscle weakness,    Repetition Increases Symptoms    Special Tests + STEMMER sign base of toes bilaterally                        OT Treatments/Exercises (OP) - 09/18/20 0001      ADLs   ADL Education Given Yes      Manual Therapy   Manual Therapy Edema management;Manual Lymphatic Drainage (MLD);Compression  Bandaging    Edema Management skin care to LLE throughout MLD with low ph castor oil to limit infection risk    Manual Lymphatic Drainage (MLD) RLE MLD utilizing short neck sequence, deep abdominal pathways, functional inguinal LN, and proximal to distal J strokes from groin to ankles.    Compression Bandaging Max A to don and doff bilateral custom stockings                  OT Education - 09/18/20 1115    Education Details Continued skilled Pt/caregiver education  And LE ADL training throughout visit  for lymphedema self care/ home program, including compression wrapping, compression garment and device wear/care, lymphatic pumping ther ex, simple self-MLD, and skin care. Discussed progress towards goals and OT POC going forward.    Person(s) Educated Patient    Methods Explanation;Demonstration    Comprehension Verbalized understanding;Returned demonstration               OT Long Term Goals - 08/02/20 0949      OT LONG TERM GOAL #1   Title Pt will be able to apply LLE multi-layer, short stretch compression wraps daily with maximum caregiver assistance, using correct gradient techniques independently to return affected limb/s, as closely as possible, to premorbid size and shape, to limit infection risk, and to improve safe functional mobility and ADLs performance.    Baseline dependent    Time 4    Period Days    Status Achieved   Pt has no assistance at SNF for compression wrapping. Pt launders and rolls bandages independently. Pt still requires ongoing verbal cues to lift leg and reposition wheil assistant waraps. 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     OT LONG TERM GOAL #2   Title Pt will be able to verbalize signs and symptoms of cellulitis infection and identify 4 common lymphedema precautions using printed resource for reference (modified independence) to limit LE progression over time.    Baseline Max A    Time 4    Period Days    Status Achieved      OT LONG  TERM GOAL #3   Title Pt will sustain than 85% compliance with all daily LE self-care home program components throughout Intensive Phase CDT, including impeccable skin care, lymphatic pumping therex,  compression wraps and simple self MLD, to ensure optimal limb volume reduction, to limit infection risk and to limit LE progression.    Baseline dependent    Time 12    Period Weeks    Status Achieved   Pt 85% compliant. Staff at SNF not always available to support 85% compliance     OT LONG TERM GOAL #4   Title Pt to achieve at least 10% LLE limb volume reduction during Intensive Phase CDT with Max CG assistance for wrapping, to improve functional performance of basic and instrumental ADLs, and to limit LE progression.   to date LLE reductions: A-D= 15.7%, E-G= 29.63%, A-G= 22.9%. RLE: A-D: 6.7%, E-G= 15.7%, A-G= 11.2%   Baseline dependent    Time 12    Period Weeks    Status On-going      OT LONG TERM GOAL #5   Title During self-management phase of CDT Pt will retain limb volume reductions achieved during Intensive Phase CDT with no more than 3% volume increase using all LE self care components  to limit LE progression and further functional decline.    Baseline dependent    Time 6    Period Months    Status On-going                 Plan - 09/18/20 1116    Clinical Impression Statement Unless Pt is not wearing custom compression garments daily as directed, limb swelling is not being adequately contained by existing custom, knee length, flat knit, class 2 (23-32 mmHg) stockings. Unfortunately increasing compression is not practical in this case due to increased difficulty donning and donning for some staff who are currently struggling, per Pt report. Skin remains in very well hydrated condition, is soft and easily mobilized without  hypersensetivity. Pt tolerated RLE mld, skin care and compression garment reapplied at end of session without increased pain. Pt continues to benefit from  weekly OT sessions for optimal management of BLE lymphedema. Cont as per POC.           Patient will benefit from skilled therapeutic intervention in order to improve the following deficits and impairments:           Visit Diagnosis: Lymphedema, not elsewhere classified    Problem List Patient Active Problem List   Diagnosis Date Noted  . Iron deficiency 08/06/2018  . History of total left knee replacement (TKR) 07/18/2018  . Rectal bleeding 07/05/2018  . Hemorrhoids 07/05/2018  . Anal itching 07/05/2018  . Rotator cuff arthropathy of right shoulder   . Shoulder arthritis 06/15/2017  . Status post revision of total knee replacement, left 04/29/2017  . Polyethylene liner wear following left total knee arthroplasty requiring isolated polyethylene liner exchange ([place]) 03/02/2017  . 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 Unstable angina ([place]) 05/21/2016  . Chronic diastolic heart failure ([place]) 03/29/2016  . Normal coronary arteries 2009 01/14/2015  . Obesity-BMI 40 01/14/2015  . Restless leg 01/14/2015  . Chest pain 01/14/2015  . Back pain 01/14/2015  . Renal insufficiency 01/14/2015  . Dementia- mild memory issues 01/14/2015  . Occult blood positive stool 12/14/2014  . Anemia 12/14/2014  . Family history of colon cancer 12/14/2014  . Change in bowel habits 12/14/2014  . GERD (gastroesophageal reflux disease) 12/14/2014  . Dyspnea 05/17/2012  . Lymphedema 05/17/2012  . Weight gain 05/17/2012  . HTN (hypertension) 05/17/2012    Andrey Spearman, MS, OTR/L, [facility] 09/18/20 11:24 AM  [place] MAIN [facility] SERVICES [address] [place], [place], [address] Phone: [phone]   Fax:  [phone]  Name: [name] MRN: [id number] Date of Birth: 02-18-1946

## 2020-09-20 ENCOUNTER — Encounter: Payer: Medicare Other | Admitting: Occupational Therapy

## 2020-09-23 ENCOUNTER — Other Ambulatory Visit: Payer: Self-pay

## 2020-09-23 ENCOUNTER — Ambulatory Visit: Payer: Medicare Other | Admitting: Occupational Therapy

## 2020-09-23 DIAGNOSIS — I890 Lymphedema, not elsewhere classified: Secondary | ICD-10-CM

## 2020-09-23 NOTE — Therapy (Deleted)
[place] MAIN [facility] SERVICES [address] [place], [place], [address] Phone: [phone]   Fax:  [phone]  Occupational Therapy Treatment  Patient Details  Name: [name] MRN: [id number] Date of Birth: [birth year] Referring Provider (OT): [name] Pitch, MD   Encounter Date: 09/23/2020   OT End of Session - 09/23/20 0814    Visit Number 85    Number of Visits 108    Date for OT Re-Evaluation 11/03/20    OT Start Time 0803    OT Stop Time 0910    OT Time Calculation (min) 67 min    Equipment Utilized During Treatment friction gloves and tyvek slipper    Activity Tolerance Patient tolerated treatment well;No increased pain   Chronic back pain worse since sliding off of bed when matress slid on frame. Denies leg pain   Behavior During Therapy [facility] for tasks assessed/performed   sleepiness throughout session          Past Medical History:  Diagnosis Date  . Anemia   . Anxiety   . Arthritis    "hands, feet" (03/03/2017)  . Brain lesion    2 types  . Cataract   . Cervical spondylosis with myelopathy   . Chest pain    Normal cardiac cath 5/09  . Chronic lower back pain   . CKD (chronic kidney disease), stage III ([place])    [name]  . Congestive heart failure (CHF) ([place])    has a Cardiomems implant   . Constipation   . Dementia ([place])   . Depression   . Dumping syndrome   . Dyspnea    with activity  . Edema   . Fatigue   . Fibromyalgia   . GERD (gastroesophageal reflux disease)   . Headache    "w/high CBG" (03/03/2017)  . History of echocardiogram    a. Echo 03/20/16 (done at [facility] in [place], [place]):  mild LVH, EF 49%, normal diastolic function, mild LAE, MAC, RVSP 25 mmHg  . Hyperlipidemia   . Hypertension   . Hypothyroidism   . Lumbar spondylosis   . Lymphedema    seeing specialist for this  . Migraine    "mostly stopped when I changed my diet" (03/03/2017)  . OSA on CPAP   . Pneumonia  X 1  . PONV (postoperative nausea and vomiting)   . Restless leg syndrome   . Rheumatoid arthritis ([place])   . Stroke [facility])    TIAs "mini strokes"- unsure of last TIA - pt and daughter deny this  . Syncope   . Tremors of nervous system   . Type II diabetes mellitus ([place])     Past Surgical History:  Procedure Laterality Date  . APPENDECTOMY  1966  . BACK SURGERY    . CARDIAC CATHETERIZATION N/A 05/25/2016   Procedure: Right/Left Heart Cath and Coronary Angiography;  Surgeon: Larey Dresser, MD;  Location: [place] CV LAB;  Service: Cardiovascular;  Laterality: N/A;  . CATARACT EXTRACTION W/ INTRAOCULAR LENS  IMPLANT, BILATERAL Bilateral   . COLONOSCOPY    . CORONARY ANGIOGRAM  2009   Normal coronaries  . EXCISION/RELEASE BURSA HIP Bilateral   . I & D KNEE WITH POLY EXCHANGE Left 03/02/2017   Procedure: Left knee Revision of poly liner;  Surgeon: Mcarthur Rossetti, MD;  Location: [place];  Service: Orthopedics;  Laterality: Left;  . JOINT REPLACEMENT    . KNEE ARTHROSCOPY Bilateral   . LAPAROSCOPIC  CHOLECYSTECTOMY  2015  . LMF  2017   CardiMEMS HF implant for CHF (measures amount of fluid in the heart)  . LUMBAR DISC SURGERY    . LUMBAR LAMINECTOMY/DECOMPRESSION MICRODISCECTOMY Left 12/2005   L2-3 laminectomy and diskectomy/notes 01/06/2011  . POSTERIOR LUMBAR FUSION  04/2006   [name] 01/06/2011; "put cages in"  . REVERSE SHOULDER ARTHROPLASTY Left 06/15/2017   Procedure: REVERSE LEFT SHOULDER ARTHROPLASTY;  Surgeon: Meredith Pel, MD;  Location: [place];  Service: Orthopedics;  Laterality: Left;  . REVERSE SHOULDER ARTHROPLASTY Right 02/01/2018  . REVERSE SHOULDER ARTHROPLASTY Right 02/01/2018   Procedure: RIGHT REVERSE SHOULDER ARTHROPLASTY;  Surgeon: Meredith Pel, MD;  Location: [place];  Service: Orthopedics;  Laterality: Right;  . REVISION TOTAL KNEE ARTHROPLASTY Left 03/02/2017   poly liner/notes 03/02/2017  . RIGHT HEART CATH N/A 11/13/2016   Procedure: Right  Heart Cath with Cardiomems;  Surgeon: Larey Dresser, MD;  Location: [place] CV LAB;  Service: Cardiovascular;  Laterality: N/A;  . SHOULDER ARTHROSCOPY WITH ROTATOR CUFF REPAIR Right   . SHOULDER OPEN ROTATOR CUFF REPAIR Left 01/2011   [name] 01/29/2011  . TOTAL ABDOMINAL HYSTERECTOMY    . TOTAL KNEE ARTHROPLASTY Left   . TOTAL KNEE ARTHROPLASTY Right 11/18/2012   Procedure: TOTAL KNEE ARTHROPLASTY;  Surgeon: Yvette Rack., MD;  Location: [place];  Service: Orthopedics;  Laterality: Right;  . TUBAL LIGATION    . UPPER GASTROINTESTINAL ENDOSCOPY    . UPPER GI ENDOSCOPY      There were no vitals filed for this visit.   Subjective Assessment - 09/23/20 0814    Subjective  [name] presents to OT for Rx visit 79/108  to address BLE lipo-lymphedema (I89.0).Pt presents without custom compression stockingsin place this morning.  Pt reports bilateral leg pain 4/10. Pt tells me she was able to use her pneumatic device daily since last visit with Max assistance for donning and doffing.    Pertinent History chronic BLE leg swelling and associated pain ~ 40 years; hx herniated intervertebral discL TKA; L total knee revision; HTN, Obesity. dementia; restless leg; CHF; unstable anina; R RC arthroplasty, OA, cervical spondylosis with myelopathy, chronic lower back pain, CKD (Stage?); Fibromyalgia; Hypothyroidism; OSA ( cPap?); hx pneumonia; RA, Type II Diabetes. S/P cardiac cathg, B THA, Lumbar laminectomy, posterior lumbar fusion, B shoulder arthroplasty; total abdominal hysterectomy    Limitations chronic leg swelling and associated pain, difficulty walking, impaired dynamic balance, muscle weakness,    Repetition Increases Symptoms    Special Tests + STEMMER sign base of toes bilaterally                        OT Treatments/Exercises (OP) - 09/23/20 1053      ADLs   ADL Education Given Yes      Manual Therapy   Manual Therapy Edema management;Manual Lymphatic Drainage  (MLD);Compression Bandaging    Edema Management skin care to RLE throughout MLD with low ph castor oil to limit infection risk    Manual Lymphatic Drainage (MLD) RLE MLD utilizing short neck sequence, deep abdominal pathways, functional inguinal LN, and proximal to distal J strokes from groin to ankles.    Compression Bandaging Max A to don L custom stocking. Applied multilayer compression wrap to RLE using gradient techniques established in an effort to regain clinical gains for swelling reduction that have been lost sibnce completing Intensive Phase CDT.  OT Education - 09/23/20 1057    Education Details Continued skilled Pt/caregiver education  And LE ADL training throughout visit for lymphedema self care/ home program, including compression wrapping, compression garment and device wear/care, lymphatic pumping ther ex, simple self-MLD, and skin care. Discussed progress towards goals and OT POC going forward.    Person(s) Educated Patient    Methods Explanation;Demonstration    Comprehension Verbalized understanding;Returned demonstration               OT Long Term Goals - 08/02/20 0949      OT LONG TERM GOAL #1   Title Pt will be able to apply LLE multi-layer, short stretch compression wraps daily with maximum caregiver assistance, using correct gradient techniques independently to return affected limb/s, as closely as possible, to premorbid size and shape, to limit infection risk, and to improve safe functional mobility and ADLs performance.    Baseline dependent    Time 4    Period Days    Status Achieved   Pt has no assistance at SNF for compression wrapping. Pt launders and rolls bandages independently. Pt still requires ongoing verbal cues to lift leg and reposition wheil assistant waraps. 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     OT LONG TERM GOAL #2   Title Pt will be able to verbalize signs and symptoms of cellulitis infection and identify 4 common  lymphedema precautions using printed resource for reference (modified independence) to limit LE progression over time.    Baseline Max A    Time 4    Period Days    Status Achieved      OT LONG TERM GOAL #3   Title Pt will sustain than 85% compliance with all daily LE self-care home program components throughout Intensive Phase CDT, including impeccable skin care, lymphatic pumping therex,  compression wraps and simple self MLD, to ensure optimal limb volume reduction, to limit infection risk and to limit LE progression.    Baseline dependent    Time 12    Period Weeks    Status Achieved   Pt 85% compliant. Staff at SNF not always available to support 85% compliance     OT LONG TERM GOAL #4   Title Pt to achieve at least 10% LLE limb volume reduction during Intensive Phase CDT with Max CG assistance for wrapping, to improve functional performance of basic and instrumental ADLs, and to limit LE progression.   to date LLE reductions: A-D= 15.7%, E-G= 29.63%, A-G= 22.9%. RLE: A-D: 6.7%, E-G= 15.7%, A-G= 11.2%   Baseline dependent    Time 12    Period Weeks    Status On-going      OT LONG TERM GOAL #5   Title During self-management phase of CDT Pt will retain limb volume reductions achieved during Intensive Phase CDT with no more than 3% volume increase using all LE self care components  to limit LE progression and further functional decline.    Baseline dependent    Time 6    Period Months    Status On-going                 Plan - 09/23/20 1059    Clinical Impression Statement Goal for sustaining 85%, or greater , compliance with essential daily compression piece of home program continues to present challenges for optimal  LE self care as Pt reports somewhat  inconsistent  assistance with donning daytime compression stockings. Limb swelling continues to fluctuate quite a bit bilaterally,  which I suspect is partially due to fluid overload, but LE control is not possible without high  level of compliance with compression, especially with complex medical hx and differential diagnoses. Pt tolerated MLD and skin care to RLE today. Provided Max A to don LLE compression knee high. Applied 5 layer gradient compression wrap from tibial tuberosity cover fat pad at inferior lateral maleolus in an effort to regain better control od leg swelling and to limit LE progression. Next visit assess progress towards all goals and POC going forward.           Patient will benefit from skilled therapeutic intervention in order to improve the following deficits and impairments:           Visit Diagnosis: Lymphedema, not elsewhere classified    Problem List Patient Active Problem List   Diagnosis Date Noted  . Iron deficiency 08/06/2018  . History of total left knee replacement (TKR) 07/18/2018  . Rectal bleeding 07/05/2018  . Hemorrhoids 07/05/2018  . Anal itching 07/05/2018  . Rotator cuff arthropathy of right shoulder   . Shoulder arthritis 06/15/2017  . Status post revision of total knee replacement, left 04/29/2017  . Polyethylene liner wear following left total knee arthroplasty requiring isolated polyethylene liner exchange ([place]) 03/02/2017  . 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 Unstable angina ([place]) 05/21/2016  . Chronic diastolic heart failure ([place]) 03/29/2016  . Normal coronary arteries 2009 01/14/2015  . Obesity-BMI 40 01/14/2015  . Restless leg 01/14/2015  . Chest pain 01/14/2015  . Back pain 01/14/2015  . Renal insufficiency 01/14/2015  . Dementia- mild memory issues 01/14/2015  . Occult blood positive stool 12/14/2014  . Anemia 12/14/2014  . Family history of colon cancer 12/14/2014  . Change in bowel habits 12/14/2014  . GERD (gastroesophageal reflux disease) 12/14/2014  . Dyspnea 05/17/2012  . Lymphedema 05/17/2012  . Weight gain 05/17/2012  . HTN (hypertension) 05/17/2012    Ansel Bong 09/23/2020, 11:12 AM  [place] MAIN [facility] SERVICES [address] [place], [place], [address] Phone: [phone]   Fax:  [phone]  Name: [name] MRN: [id number] Date of Birth: 09-06-1945

## 2020-09-23 NOTE — Therapy (Signed)
[place] MAIN [facility] SERVICES [address] [place], [place], [address] Phone: [phone]   Fax:  [phone]  Occupational Therapy Treatment  Patient Details  Name: [name] MRN: [id number] Date of Birth: [birth year] Referring Provider (OT): Juluis Pitch, MD   Encounter Date: 09/23/2020   OT End of Session - 09/23/20 0814    Visit Number 85    Number of Visits 108    Date for OT Re-Evaluation 11/03/20    OT Start Time 0803    OT Stop Time 0910    OT Time Calculation (min) 67 min    Equipment Utilized During Treatment friction gloves and tyvek slipper    Activity Tolerance Patient tolerated treatment well;No increased pain   Chronic back pain worse since sliding off of bed when matress slid on frame. Denies leg pain   Behavior During Therapy [facility] for tasks assessed/performed   sleepiness throughout session          Past Medical History:  Diagnosis Date  . Anemia   . Anxiety   . Arthritis    "hands, feet" (03/03/2017)  . Brain lesion    2 types  . Cataract   . Cervical spondylosis with myelopathy   . Chest pain    Normal cardiac cath 5/09  . Chronic lower back pain   . CKD (chronic kidney disease), stage III ([place])    [name]  . Congestive heart failure (CHF) ([place])    has a Cardiomems implant   . Constipation   . Dementia ([place])   . Depression   . Dumping syndrome   . Dyspnea    with activity  . Edema   . Fatigue   . Fibromyalgia   . GERD (gastroesophageal reflux disease)   . Headache    "w/high CBG" (03/03/2017)  . History of echocardiogram    a. Echo 03/20/16 (done at [facility] in [place], [place]):  mild LVH, EF 49%, normal diastolic function, mild LAE, MAC, RVSP 25 mmHg  . Hyperlipidemia   . Hypertension   . Hypothyroidism   . Lumbar spondylosis   . Lymphedema    seeing specialist for this  . Migraine    "mostly stopped when I changed my diet" (03/03/2017)  . OSA on CPAP   . Pneumonia  X 1  . PONV (postoperative nausea and vomiting)   . Restless leg syndrome   . Rheumatoid arthritis ([place])   . Stroke [facility])    TIAs "mini strokes"- unsure of last TIA - pt and daughter deny this  . Syncope   . Tremors of nervous system   . Type II diabetes mellitus ([place])     Past Surgical History:  Procedure Laterality Date  . APPENDECTOMY  1966  . BACK SURGERY    . CARDIAC CATHETERIZATION N/A 05/25/2016   Procedure: Right/Left Heart Cath and Coronary Angiography;  Surgeon: Larey Dresser, MD;  Location: [place] [facility];  Service: Cardiovascular;  Laterality: N/A;  . CATARACT EXTRACTION W/ INTRAOCULAR LENS  IMPLANT, BILATERAL Bilateral   . COLONOSCOPY    . CORONARY ANGIOGRAM  2009   Normal coronaries  . EXCISION/RELEASE BURSA HIP Bilateral   . I & D KNEE WITH POLY EXCHANGE Left 03/02/2017   Procedure: Left knee Revision of poly liner;  Surgeon: Mcarthur Rossetti, MD;  Location: [place];  Service: Orthopedics;  Laterality: Left;  . JOINT REPLACEMENT    . KNEE ARTHROSCOPY Bilateral   . LAPAROSCOPIC  CHOLECYSTECTOMY  2015  . LMF  2017   CardiMEMS HF implant for CHF (measures amount of fluid in the heart)  . LUMBAR DISC SURGERY    . LUMBAR LAMINECTOMY/DECOMPRESSION MICRODISCECTOMY Left 12/2005   L2-3 laminectomy and diskectomy/notes 01/06/2011  . POSTERIOR LUMBAR FUSION  04/2006   [name] 01/06/2011; "put cages in"  . REVERSE SHOULDER ARTHROPLASTY Left 06/15/2017   Procedure: REVERSE LEFT SHOULDER ARTHROPLASTY;  Surgeon: Meredith Pel, MD;  Location: [place];  Service: Orthopedics;  Laterality: Left;  . REVERSE SHOULDER ARTHROPLASTY Right 02/01/2018  . REVERSE SHOULDER ARTHROPLASTY Right 02/01/2018   Procedure: RIGHT REVERSE SHOULDER ARTHROPLASTY;  Surgeon: Meredith Pel, MD;  Location: [place];  Service: Orthopedics;  Laterality: Right;  . REVISION TOTAL KNEE ARTHROPLASTY Left 03/02/2017   poly liner/notes 03/02/2017  . RIGHT HEART CATH N/A 11/13/2016   Procedure: Right  Heart Cath with Cardiomems;  Surgeon: Larey Dresser, MD;  Location: [place] [facility];  Service: Cardiovascular;  Laterality: N/A;  . SHOULDER ARTHROSCOPY WITH ROTATOR CUFF REPAIR Right   . SHOULDER OPEN ROTATOR CUFF REPAIR Left 01/2011   [name] 01/29/2011  . TOTAL ABDOMINAL HYSTERECTOMY    . TOTAL KNEE ARTHROPLASTY Left   . TOTAL KNEE ARTHROPLASTY Right 11/18/2012   Procedure: TOTAL KNEE ARTHROPLASTY;  Surgeon: Yvette Rack., MD;  Location: [place];  Service: Orthopedics;  Laterality: Right;  . TUBAL LIGATION    . UPPER GASTROINTESTINAL ENDOSCOPY    . UPPER GI ENDOSCOPY      There were no vitals filed for this visit.   Subjective Assessment - 09/23/20 0814    Subjective  [name] presents to OT for Rx visit 79/108  to address BLE lipo-lymphedema (I89.0).Pt presents without custom compression stockingsin place this morning.  Pt reports bilateral leg pain 4/10. Pt tells me she was able to use her pneumatic device daily since last visit with Max assistance for donning and doffing.    Pertinent History chronic BLE leg swelling and associated pain ~ 40 years; hx herniated intervertebral discL TKA; L total knee revision; HTN, Obesity. dementia; restless leg; CHF; unstable anina; R RC arthroplasty, OA, cervical spondylosis with myelopathy, chronic lower back pain, CKD (Stage?); Fibromyalgia; Hypothyroidism; OSA ( cPap?); hx pneumonia; RA, Type II Diabetes. S/P cardiac cathg, B THA, Lumbar laminectomy, posterior lumbar fusion, B shoulder arthroplasty; total abdominal hysterectomy    Limitations chronic leg swelling and associated pain, difficulty walking, impaired dynamic balance, muscle weakness,    Repetition Increases Symptoms    Special Tests + STEMMER sign base of toes bilaterally                        OT Treatments/Exercises (OP) - 09/23/20 1053      ADLs   ADL Education Given Yes      Manual Therapy   Manual Therapy Edema management;Manual Lymphatic Drainage  (MLD);Compression Bandaging    Edema Management skin care to RLE throughout MLD with low ph castor oil to limit infection risk    Manual Lymphatic Drainage (MLD) RLE MLD utilizing short neck sequence, deep abdominal pathways, functional inguinal LN, and proximal to distal J strokes from groin to ankles.    Compression Bandaging Max A to don L custom stocking. Applied multilayer compression wrap to RLE using gradient techniques established in an effort to regain clinical gains for swelling reduction that have been lost sibnce completing Intensive Phase CDT.  OT Education - 09/23/20 1057    Education Details Continued skilled Pt/caregiver education  And LE ADL training throughout visit for lymphedema self care/ home program, including compression wrapping, compression garment and device wear/care, lymphatic pumping ther ex, simple self-MLD, and skin care. Discussed progress towards goals and OT POC going forward.    Person(s) Educated Patient    Methods Explanation;Demonstration    Comprehension Verbalized understanding;Returned demonstration               OT Long Term Goals - 08/02/20 0949      OT LONG TERM GOAL #1   Title Pt will be able to apply LLE multi-layer, short stretch compression wraps daily with maximum caregiver assistance, using correct gradient techniques independently to return affected limb/s, as closely as possible, to premorbid size and shape, to limit infection risk, and to improve safe functional mobility and ADLs performance.    Baseline dependent    Time 4    Period Days    Status Achieved   Pt has no assistance at SNF for compression wrapping. Pt launders and rolls bandages independently. Pt still requires ongoing verbal cues to lift leg and reposition wheil assistant waraps. 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     OT LONG TERM GOAL #2   Title Pt will be able to verbalize signs and symptoms of cellulitis infection and identify 4 common  lymphedema precautions using printed resource for reference (modified independence) to limit LE progression over time.    Baseline Max A    Time 4    Period Days    Status Achieved      OT LONG TERM GOAL #3   Title Pt will sustain than 85% compliance with all daily LE self-care home program components throughout Intensive Phase CDT, including impeccable skin care, lymphatic pumping therex,  compression wraps and simple self MLD, to ensure optimal limb volume reduction, to limit infection risk and to limit LE progression.    Baseline dependent    Time 12    Period Weeks    Status Achieved   Pt 85% compliant. Staff at SNF not always available to support 85% compliance     OT LONG TERM GOAL #4   Title Pt to achieve at least 10% LLE limb volume reduction during Intensive Phase CDT with Max CG assistance for wrapping, to improve functional performance of basic and instrumental ADLs, and to limit LE progression.   to date LLE reductions: A-D= 15.7%, E-G= 29.63%, A-G= 22.9%. RLE: A-D: 6.7%, E-G= 15.7%, A-G= 11.2%   Baseline dependent    Time 12    Period Weeks    Status On-going      OT LONG TERM GOAL #5   Title During self-management phase of CDT Pt will retain limb volume reductions achieved during Intensive Phase CDT with no more than 3% volume increase using all LE self care components  to limit LE progression and further functional decline.    Baseline dependent    Time 6    Period Months    Status On-going                 Plan - 09/23/20 1112    Clinical Impression Statement Goal for sustaining 85%, or greater , compliance with essential daily compression piece of home program continues to present challenges for optimal  LE self care as Pt reports somewhat  inconsistent  assistance with donning daytime compression stockings. Limb swelling continues to fluctuate quite a bit bilaterally,  which I suspect is partially due to fluid overload, but LE control is not possible without high  level of compliance with compression, especially with complex medical hx and differential diagnoses. Pt tolerated MLD and skin care to RLE today. Provided Max A to don LLE compression knee high. Applied 5 layer gradient compression wrap from tibial tuberosity cover fat pad at inferior lateral maleolus in an effort to regain better control od leg swelling and to limit LE progression. Next visit assess progress towards all goals and POC going forward.    OT Occupational Profile and History Problem Focused Assessment - Including review of records relating to presenting problem    Occupational performance deficits (Please refer to evaluation for details): ADL's;IADL's;Rest and Sleep;Leisure;Social Participation;Other   body image   Body Structure / Function / Physical Skills ADL;Pain;Balance;Edema;IADL;Decreased knowledge of precautions;Skin integrity;Decreased knowledge of use of DME;ROM    Rehab Potential Good    Clinical Decision Making Multiple treatment options, significant modification of task necessary    Comorbidities Affecting Occupational Performance: Presence of comorbidities impacting occupational performance    Modification or Assistance to Complete Evaluation  Min-Moderate modification of tasks or assist with assess necessary to complete eval    OT Frequency 1x / week    OT Duration 12 weeks    OT Treatment/Interventions Self-care/ADL training;DME and/or AE instruction;Manual lymph drainage;Compression bandaging;Therapeutic activities;Coping strategies training;Therapeutic exercise;Manual Therapy;Patient/family education;Other (comment)   tissue fibrosis massage; skin care to reduce tissue sensativity/ pain and increase hydration to limit infection risk and LE progression   Plan Complete Deconhgestive Therapy (CDT) Intensive and Self -Management phases, consisting of Manual lymphatic Drainage (MLD), skin care, ther ex and compression bandaging    OT Home Exercise Plan lymphatic pumping therex  2-3 x q day, in sequence, 10 reps bilaterally    Recommended Other Services Daytime: 2 Pr. Jobst EVAREX custom, ccl 2, A-D, 2.5 cm SB at oblique top edge, open toe, t heel.  HOS: Jobst RELAX custom ccl 2 A-D, OT w. posterior zipper    Consulted and Agree with Plan of Care Patient           Patient will benefit from skilled therapeutic intervention in order to improve the following deficits and impairments:   Body Structure / Function / Physical Skills: ADL,Pain,Balance,Edema,IADL,Decreased knowledge of precautions,Skin integrity,Decreased knowledge of use of DME,ROM       Visit Diagnosis: Lymphedema, not elsewhere classified    Problem List Patient Active Problem List   Diagnosis Date Noted  . Iron deficiency 08/06/2018  . History of total left knee replacement (TKR) 07/18/2018  . Rectal bleeding 07/05/2018  . Hemorrhoids 07/05/2018  . Anal itching 07/05/2018  . Rotator cuff arthropathy of right shoulder   . Shoulder arthritis 06/15/2017  . Status post revision of total knee replacement, left 04/29/2017  . Polyethylene liner wear following left total knee arthroplasty requiring isolated polyethylene liner exchange ([place]) 03/02/2017  . 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 Unstable angina ([place]) 05/21/2016  . Chronic diastolic heart failure ([place]) 03/29/2016  . Normal coronary arteries 2009 01/14/2015  . Obesity-BMI 40 01/14/2015  . Restless leg 01/14/2015  . Chest pain 01/14/2015  . Back pain 01/14/2015  . Renal insufficiency 01/14/2015  . Dementia- mild memory issues 01/14/2015  . Occult blood positive stool 12/14/2014  . Anemia 12/14/2014  . Family history of colon cancer 12/14/2014  . Change in bowel habits 12/14/2014  . GERD (gastroesophageal reflux disease) 12/14/2014  . Dyspnea 05/17/2012  .  Lymphedema 05/17/2012  . Weight gain 05/17/2012  . HTN (hypertension) 05/17/2012    Andrey Spearman, MS, OTR/L, [facility] 09/23/20 11:17  AM  [place] MAIN [facility] SERVICES [address] [place], [place], [address] Phone: [phone]   Fax:  [phone]  Name: [name] MRN: [id number] Date of Birth: July 14, 1946

## 2020-09-25 ENCOUNTER — Encounter: Payer: Medicare Other | Admitting: Occupational Therapy

## 2020-09-27 ENCOUNTER — Encounter: Payer: Medicare Other | Admitting: Occupational Therapy

## 2020-09-30 ENCOUNTER — Other Ambulatory Visit: Payer: Self-pay

## 2020-09-30 ENCOUNTER — Ambulatory Visit: Payer: Medicare Other | Attending: Nephrology | Admitting: Occupational Therapy

## 2020-09-30 DIAGNOSIS — I890 Lymphedema, not elsewhere classified: Secondary | ICD-10-CM | POA: Diagnosis present

## 2020-09-30 NOTE — Therapy (Unsigned)
[place] MAIN [facility] SERVICES [address] [place], [place], [address] Phone: [phone]   Fax:  [phone]  Occupational Therapy Treatment Note and Progress Report  Patient Details  Name: [name] MRN: [id number] Date of Birth: 03-21-1946 Referring Provider (OT): Juluis Pitch, MD   Encounter Date: 09/30/2020   OT End of Session - 09/30/20 0820    Visit Number 80    Number of Visits 108    Date for OT Re-Evaluation 11/03/20    OT Start Time 0803    OT Stop Time 0915    OT Time Calculation (min) 72 min    Equipment Utilized During Treatment friction gloves and tyvek slipper    Activity Tolerance Patient tolerated treatment well;No increased pain   Chronic back pain worse since sliding off of bed when matress slid on frame. Denies leg pain   Behavior During Therapy [facility] for tasks assessed/performed   sleepiness throughout session          Past Medical History:  Diagnosis Date  . Anemia   . Anxiety   . Arthritis    "hands, feet" (03/03/2017)  . Brain lesion    2 types  . Cataract   . Cervical spondylosis with myelopathy   . Chest pain    Normal cardiac cath 5/09  . Chronic lower back pain   . CKD (chronic kidney disease), stage III ([place])    [name]  . Congestive heart failure (CHF) ([place])    has a Cardiomems implant   . Constipation   . Dementia ([place])   . Depression   . Dumping syndrome   . Dyspnea    with activity  . Edema   . Fatigue   . Fibromyalgia   . GERD (gastroesophageal reflux disease)   . Headache    "w/high CBG" (03/03/2017)  . History of echocardiogram    a. Echo 03/20/16 (done at [facility] in [place], [place]):  mild LVH, EF 63%, normal diastolic function, mild LAE, MAC, RVSP 25 mmHg  . Hyperlipidemia   . Hypertension   . Hypothyroidism   . Lumbar spondylosis   . Lymphedema    seeing specialist for this  . Migraine    "mostly stopped when I changed my diet" (03/03/2017)  . OSA  on CPAP   . Pneumonia X 1  . PONV (postoperative nausea and vomiting)   . Restless leg syndrome   . Rheumatoid arthritis ([place])   . Stroke [facility])    TIAs "mini strokes"- unsure of last TIA - pt and daughter deny this  . Syncope   . Tremors of nervous system   . Type II diabetes mellitus ([place])     Past Surgical History:  Procedure Laterality Date  . APPENDECTOMY  1966  . BACK SURGERY    . CARDIAC CATHETERIZATION N/A 05/25/2016   Procedure: Right/Left Heart Cath and Coronary Angiography;  Surgeon: Larey Dresser, MD;  Location: [place] [facility];  Service: Cardiovascular;  Laterality: N/A;  . CATARACT EXTRACTION W/ INTRAOCULAR LENS  IMPLANT, BILATERAL Bilateral   . COLONOSCOPY    . CORONARY ANGIOGRAM  2009   Normal coronaries  . EXCISION/RELEASE BURSA HIP Bilateral   . I & D KNEE WITH POLY EXCHANGE Left 03/02/2017   Procedure: Left knee Revision of poly liner;  Surgeon: Mcarthur Rossetti, MD;  Location: [place];  Service: Orthopedics;  Laterality: Left;  . JOINT REPLACEMENT    . KNEE ARTHROSCOPY Bilateral   .  LAPAROSCOPIC CHOLECYSTECTOMY  2015  . LMF  2017   CardiMEMS HF implant for CHF (measures amount of fluid in the heart)  . LUMBAR DISC SURGERY    . LUMBAR LAMINECTOMY/DECOMPRESSION MICRODISCECTOMY Left 12/2005   L2-3 laminectomy and diskectomy/notes 01/06/2011  . POSTERIOR LUMBAR FUSION  04/2006   [name] 01/06/2011; "put cages in"  . REVERSE SHOULDER ARTHROPLASTY Left 06/15/2017   Procedure: REVERSE LEFT SHOULDER ARTHROPLASTY;  Surgeon: Meredith Pel, MD;  Location: [place];  Service: Orthopedics;  Laterality: Left;  . REVERSE SHOULDER ARTHROPLASTY Right 02/01/2018  . REVERSE SHOULDER ARTHROPLASTY Right 02/01/2018   Procedure: RIGHT REVERSE SHOULDER ARTHROPLASTY;  Surgeon: Meredith Pel, MD;  Location: [place];  Service: Orthopedics;  Laterality: Right;  . REVISION TOTAL KNEE ARTHROPLASTY Left 03/02/2017   poly liner/notes 03/02/2017  . RIGHT HEART CATH N/A 11/13/2016    Procedure: Right Heart Cath with Cardiomems;  Surgeon: Larey Dresser, MD;  Location: [place] [facility];  Service: Cardiovascular;  Laterality: N/A;  . SHOULDER ARTHROSCOPY WITH ROTATOR CUFF REPAIR Right   . SHOULDER OPEN ROTATOR CUFF REPAIR Left 01/2011   [name] 01/29/2011  . TOTAL ABDOMINAL HYSTERECTOMY    . TOTAL KNEE ARTHROPLASTY Left   . TOTAL KNEE ARTHROPLASTY Right 11/18/2012   Procedure: TOTAL KNEE ARTHROPLASTY;  Surgeon: Yvette Rack., MD;  Location: [place];  Service: Orthopedics;  Laterality: Right;  . TUBAL LIGATION    . UPPER GASTROINTESTINAL ENDOSCOPY    . UPPER GI ENDOSCOPY      There were no vitals filed for this visit.   Subjective Assessment - 09/30/20 0817    Subjective  [name] presents to OT for Rx visit 80/108  to address BLE lipo-lymphedema (I89.0).Pt presents without custom compression stockingsin place this morning. Pt reports it took her about 40 minutes to don her stockings herself this morning, but she mistakenly put them on the wrong feet ;-) Pt reports lower back , hip and shoulder pain this morning of 6/10. Pt denies LE related pain. Pt and OT discussed progress towards goals and new goals going forward to improve LE self management over time.    Pertinent History chronic BLE leg swelling and associated pain ~ 40 years; hx herniated intervertebral discL TKA; L total knee revision; HTN, Obesity. dementia; restless leg; CHF; unstable anina; R RC arthroplasty, OA, cervical spondylosis with myelopathy, chronic lower back pain, CKD (Stage?); Fibromyalgia; Hypothyroidism; OSA ( cPap?); hx pneumonia; RA, Type II Diabetes. S/P cardiac cathg, B THA, Lumbar laminectomy, posterior lumbar fusion, B shoulder arthroplasty; total abdominal hysterectomy    Limitations chronic leg swelling and associated pain, chronic OA pain, difficulty walking, impaired balance, muscle weakness, decreased knee hip and shoulder AROM, decreased standing tolerance    Repetition Increases Symptoms     Special Tests - STEMMER sign base of toes bilaterally    Patient Stated Goals get my legs up more. do skin care more often    Currently in Pain? Yes    Pain Score 6     Effect of Pain on Daily Activities limits functional mobility and transfers, limits performance of basic and instrumental ADLs, productive activitiees, leisure pursuits and social participation, limits body image    Multiple Pain Sites Yes               LYMPHEDEMA/ONCOLOGY QUESTIONNAIRE - 09/30/20 0001      Right Lower Extremity Lymphedema   Other RLE limb volume from ankle to tibial tuberosity (A-D) + 9522.3 ml.  Other R leg (A-D) volume is DECreased by 11.8% since last measured on 08/02/20.( For comparison, on 12/ 10 it was INcreased dramtically by 22.5%.      Left Lower Extremity Lymphedema   Other LLE A-D volume = 9554.3 ml.    Other L leg volume is decreased by 2.75 SINCE LAST MEASURED ON 08/02/20. On 08/02/20, the L leg increase was nearly symetrical with the R leg increase at 19.2%.                   OT Treatments/Exercises (OP) - 09/30/20 5701      ADLs   ADL Education Given Yes      Manual Therapy   Manual Therapy Edema management;Manual Lymphatic Drainage (MLD);Compression Bandaging    Edema Management BLE comparative limb volumetrics for progress report    Compression Bandaging Max A to don BLE custom stockings after session                       OT Long Term Goals - 09/30/20 7793      OT LONG TERM GOAL #1   Title Pt will be able to apply LLE multi-layer, short stretch compression wraps daily with maximum caregiver assistance, using correct gradient techniques independently to return affected limb/s, as closely as possible, to premorbid size and shape, to limit infection risk, and to improve safe functional mobility and ADLs performance.    Baseline dependent    Time 4    Period Days    Status Achieved   DC goal. Pt not currently wrapping. There is no one available at  SNF to apply multilayer wraps at present.     OT LONG TERM GOAL #2   Title Pt will be able to verbalize signs and symptoms of cellulitis infection and identify 4 common lymphedema precautions using printed resource for reference (modified independence) to limit LE progression over time.    Baseline Max A    Time 4    Period Days    Status Achieved      OT LONG TERM GOAL #3   Title Pt will sustain 85%, or greater compliance with all daily LE self-care home program components throughout Intensive Phase CDT, including impeccable skin care, lymphatic pumping therex,  compression wraps;/ garments and devices and simple self MLD, or Flexitouch device daily to ensure optimal limb volume reduction, to limit infection risk and to limit LE progression.   Pt reports 90% compliance. She is dependent on staff at her SNF for assistance, and this has been challenging with staff levels low and travelers often covering vacant positions.   Baseline dependent    Time 12    Period Weeks    Status Partially Met   Pt reports she does not perform skin care daily as directed, but does ~ 4 x weekly. This is an area she will work on going forward.   Target Date 11/03/20      OT LONG TERM GOAL #4   Title Pt to achieve at least 10% LLE limb volume reduction during Intensive Phase CDT with Max CG assistance for wrapping, to improve functional performance of basic and instrumental ADLs, and to limit LE progression.   to date LLE reductions: A-D= 15.7%, E-G= 29.63%, A-G= 22.9%. RLE: A-D: 6.7%, E-G= 15.7%, A-G= 11.2%   Baseline dependent    Time 12    Period Weeks    Status Partially Met   Limb volume continues to fluctuate, somcetimes dramatically. Continue regular  OT for CDT in effort to stabilize  within goal range, for at least 6 consecutive months, to limit infection risk, LE progression and further functional decline   Target Date 11/03/20      OT LONG TERM GOAL #5   Title During self-management phase of CDT Pt will  retain limb volume reductions achieved during Intensive Phase CDT with no more than 3% volume increase using all LE self care components  to limit LE progression and further functional decline.    Baseline dependent    Time 6    Period Months    Status On-going   see comment above. Not yet met, but ongoing   Target Date 11/03/20      Long Term Additional Goals   Additional Long Term Goals Yes      OT LONG TERM GOAL #6   Title Pt will increase compliance w/ LE self care home program by increasing skin care frequency from 4 x to 5 time daily each week to limit LE progression and reduce infection risk.    Baseline Mod A    Time 12    Period Weeks    Status New    Target Date 12/29/20      OT LONG TERM GOAL #7   Title Pt will increase leg elevation during regular seated routines by at least 2 hours per day to limit LE progression and further functional decline.    Baseline Mod A    Time 12    Period Weeks    Status New    Target Date 12/29/20                 Plan - 09/30/20 0949    Clinical Impression Statement Pt and I discussed progress towards OT goals for LE care, new goals and plan of care going forward throughout session. Pt assessed self care home program performance and identified 2 areas, which she believes she can improve, including  skin care and leg elevation.  Completed B leg comparative limb volumetrics, which reveal reductions in both R and L legs below the knee today compared with last meaurements on 08/02/20. LLE volume from ankle to tibial tuberosity (A-D) measures 9554.3 ml. The  R leg A-D volume is DECreased by 11.8% since 08/02/20. For comparison, on 12/ 10 the R leg was INcreased dramtically in volume by 22.5% that day. Today the LLE A-D volume measures 9554.3 ml  below the knee. Interestingly, the LLE A-D volume had a nearly symmetrical limb volume increase 0n 12/10 of 19.2%. Symmetrical limb volume fluctuations are atypical in lymphedem, and , in my experience,  even in lipolymphedema presentations, so I suspect that tis large, symmetrical fluctuation bilaterally is largely due to systemic fluid fluctuation. We are pleased with bilateral reductions today that present less dramatically. The asymmetry of todays fluctuations also appear more in line with regular volume fluctuations in lymphedema. Today'd volumetrics are a snapshot of time, so we will continue to monitor this carefully. Please review Long Term Goals section for detailed info for all other goals. The goals we added today include increase leg elevation daily by 2 hours 5 days weekly, and increase skin care to legs to at least  5 days weekly.[name] continues to benefir from skilled OT for support with management of chronic, progressive lymphedema. She has remained infection free since commencing OT for CDT, has minimal lymphedema related pain in her legs, is able to lift feet and legs w/ less physical effort when dressing,  bathing, performing transfers and when  ambulating. Cont at 1 x weekly frequency as per POC since SNF staffing is inconsistent.    OT Occupational Profile and History Problem Focused Assessment - Including review of records relating to presenting problem    Occupational performance deficits (Please refer to evaluation for details): ADL's;IADL's;Rest and Sleep;Leisure;Social Participation;Other   body image   Body Structure / Function / Physical Skills ADL;Pain;Balance;Edema;IADL;Decreased knowledge of precautions;Skin integrity;Decreased knowledge of use of DME;ROM    Rehab Potential Good    Clinical Decision Making Multiple treatment options, significant modification of task necessary    Comorbidities Affecting Occupational Performance: Presence of comorbidities impacting occupational performance    Modification or Assistance to Complete Evaluation  Min-Moderate modification of tasks or assist with assess necessary to complete eval    OT Frequency 1x / week    OT Duration 12 weeks     OT Treatment/Interventions Self-care/ADL training;DME and/or AE instruction;Manual lymph drainage;Compression bandaging;Therapeutic activities;Coping strategies training;Therapeutic exercise;Manual Therapy;Patient/family education;Other (comment)   tissue fibrosis massage; skin care to reduce tissue sensativity/ pain and increase hydration to limit infection risk and LE progression   Plan Complete Deconhgestive Therapy (CDT) Intensive and Self -Management phases, consisting of Manual lymphatic Drainage (MLD), skin care, ther ex and compression bandaging    OT Home Exercise Plan lymphatic pumping therex 2-3 x q day, in sequence, 10 reps bilaterally    Recommended Other Services Daytime: 2 Pr. [employer]st EVAREX custom, ccl 2, A-D, 2.5 cm SB at oblique top edge, open toe, t heel.  HOS: Jobst RELAX custom ccl 2 A-D, OT w. posterior zipper    Consulted and Agree with Plan of Care Patient           Patient will benefit from skilled therapeutic intervention in order to improve the following deficits and impairments:   Body Structure / Function / Physical Skills: ADL,Pain,Balance,Edema,IADL,Decreased knowledge of precautions,Skin integrity,Decreased knowledge of use of DME,ROM       Visit Diagnosis: Lymphedema, not elsewhere classified    Problem List Patient Active Problem List   Diagnosis Date Noted  . Iron deficiency 08/06/2018  . History of total left knee replacement (TKR) 07/18/2018  . Rectal bleeding 07/05/2018  . Hemorrhoids 07/05/2018  . Anal itching 07/05/2018  . Rotator cuff arthropathy of right shoulder   . Shoulder arthritis 06/15/2017  . Status post revision of total knee replacement, left 04/29/2017  . Polyethylene liner wear following left total knee arthroplasty requiring isolated polyethylene liner exchange ([place]) 03/02/2017  . 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 Unstable angina ([place]) 05/21/2016  . Chronic diastolic heart failure ([place])  03/29/2016  . Normal coronary arteries 2009 01/14/2015  . Obesity-BMI 40 01/14/2015  . Restless leg 01/14/2015  . Chest pain 01/14/2015  . Back pain 01/14/2015  . Renal insufficiency 01/14/2015  . Dementia- mild memory issues 01/14/2015  . Occult blood positive stool 12/14/2014  . Anemia 12/14/2014  . Family history of colon cancer 12/14/2014  . Change in bowel habits 12/14/2014  . GERD (gastroesophageal reflux disease) 12/14/2014  . Dyspnea 05/17/2012  . Lymphedema 05/17/2012  . Weight gain 05/17/2012  . HTN (hypertension) 05/17/2012    Andrey Spearman, MS, OTR/L, [facility] 09/30/20 11:05 AM  [place] MAIN [facility] SERVICES [address] [place], [place], [address] Phone: [phone]   Fax:  [phone]  Name: [name] MRN: [id number] Date of Birth: 1946-07-17

## 2020-10-07 ENCOUNTER — Other Ambulatory Visit: Payer: Self-pay

## 2020-10-07 ENCOUNTER — Ambulatory Visit: Payer: Medicare Other | Admitting: Occupational Therapy

## 2020-10-07 DIAGNOSIS — I890 Lymphedema, not elsewhere classified: Secondary | ICD-10-CM

## 2020-10-07 NOTE — Therapy (Signed)
[place] MAIN [facility] SERVICES [address] [place], [place], [address] Phone: [phone]   Fax:  [phone]  Occupational Therapy Treatment  Patient Details  Name: [name] MRN: [id number] Date of Birth: 1945-09-03 Referring Provider (OT): Juluis Pitch, MD   Encounter Date: 10/07/2020   OT End of Session - 10/07/20 0900    Visit Number 81    Number of Visits 108    Date for OT Re-Evaluation 11/03/20    OT Start Time 0803    OT Stop Time 0910    OT Time Calculation (min) 67 min    Equipment Utilized During Treatment friction gloves and tyvek slipper    Activity Tolerance Patient tolerated treatment well;No increased pain   Chronic back pain worse since sliding off of bed when matress slid on frame. Denies leg pain   Behavior During Therapy [facility] for tasks assessed/performed   sleepiness throughout session          Past Medical History:  Diagnosis Date  . Anemia   . Anxiety   . Arthritis    "hands, feet" (03/03/2017)  . Brain lesion    2 types  . Cataract   . Cervical spondylosis with myelopathy   . Chest pain    Normal cardiac cath 5/09  . Chronic lower back pain   . CKD (chronic kidney disease), stage III ([place])    [name]  . Congestive heart failure (CHF) ([place])    has a Cardiomems implant   . Constipation   . Dementia ([place])   . Depression   . Dumping syndrome   . Dyspnea    with activity  . Edema   . Fatigue   . Fibromyalgia   . GERD (gastroesophageal reflux disease)   . Headache    "w/high CBG" (03/03/2017)  . History of echocardiogram    a. Echo 03/20/16 (done at [facility] in [place], [place]):  mild LVH, EF 95%, normal diastolic function, mild LAE, MAC, RVSP 25 mmHg  . Hyperlipidemia   . Hypertension   . Hypothyroidism   . Lumbar spondylosis   . Lymphedema    seeing specialist for this  . Migraine    "mostly stopped when I changed my diet" (03/03/2017)  . OSA on CPAP   . Pneumonia  X 1  . PONV (postoperative nausea and vomiting)   . Restless leg syndrome   . Rheumatoid arthritis ([place])   . Stroke [facility])    TIAs "mini strokes"- unsure of last TIA - pt and daughter deny this  . Syncope   . Tremors of nervous system   . Type II diabetes mellitus ([place])     Past Surgical History:  Procedure Laterality Date  . APPENDECTOMY  1966  . BACK SURGERY    . CARDIAC CATHETERIZATION N/A 05/25/2016   Procedure: Right/Left Heart Cath and Coronary Angiography;  Surgeon: Larey Dresser, MD;  Location: [place] [facility];  Service: Cardiovascular;  Laterality: N/A;  . CATARACT EXTRACTION W/ INTRAOCULAR LENS  IMPLANT, BILATERAL Bilateral   . COLONOSCOPY    . CORONARY ANGIOGRAM  2009   Normal coronaries  . EXCISION/RELEASE BURSA HIP Bilateral   . I & D KNEE WITH POLY EXCHANGE Left 03/02/2017   Procedure: Left knee Revision of poly liner;  Surgeon: Mcarthur Rossetti, MD;  Location: [place];  Service: Orthopedics;  Laterality: Left;  . JOINT REPLACEMENT    . KNEE ARTHROSCOPY Bilateral   . LAPAROSCOPIC  CHOLECYSTECTOMY  2015  . LMF  2017   CardiMEMS HF implant for CHF (measures amount of fluid in the heart)  . LUMBAR DISC SURGERY    . LUMBAR LAMINECTOMY/DECOMPRESSION MICRODISCECTOMY Left 12/2005   L2-3 laminectomy and diskectomy/notes 01/06/2011  . POSTERIOR LUMBAR FUSION  04/2006   [name] 01/06/2011; "put cages in"  . REVERSE SHOULDER ARTHROPLASTY Left 06/15/2017   Procedure: REVERSE LEFT SHOULDER ARTHROPLASTY;  Surgeon: Meredith Pel, MD;  Location: [place];  Service: Orthopedics;  Laterality: Left;  . REVERSE SHOULDER ARTHROPLASTY Right 02/01/2018  . REVERSE SHOULDER ARTHROPLASTY Right 02/01/2018   Procedure: RIGHT REVERSE SHOULDER ARTHROPLASTY;  Surgeon: Meredith Pel, MD;  Location: [place];  Service: Orthopedics;  Laterality: Right;  . REVISION TOTAL KNEE ARTHROPLASTY Left 03/02/2017   poly liner/notes 03/02/2017  . RIGHT HEART CATH N/A 11/13/2016   Procedure: Right  Heart Cath with Cardiomems;  Surgeon: Larey Dresser, MD;  Location: [place] [facility];  Service: Cardiovascular;  Laterality: N/A;  . SHOULDER ARTHROSCOPY WITH ROTATOR CUFF REPAIR Right   . SHOULDER OPEN ROTATOR CUFF REPAIR Left 01/2011   [name] 01/29/2011  . TOTAL ABDOMINAL HYSTERECTOMY    . TOTAL KNEE ARTHROPLASTY Left   . TOTAL KNEE ARTHROPLASTY Right 11/18/2012   Procedure: TOTAL KNEE ARTHROPLASTY;  Surgeon: Yvette Rack., MD;  Location: [place];  Service: Orthopedics;  Laterality: Right;  . TUBAL LIGATION    . UPPER GASTROINTESTINAL ENDOSCOPY    . UPPER GI ENDOSCOPY      There were no vitals filed for this visit.   Subjective Assessment - 10/07/20 0816    Subjective  [name] presents to OT for Rx visit 81/108  to address BLE lipo-lymphedema (I89.0).Pt presents without custom compression stockings in place this morning. Pt reports L knee pain 4/10. Pt reports knee pain is better after 3 recent ultrasound treatments in PT.    Pertinent History chronic BLE leg swelling and associated pain ~ 40 years; hx herniated intervertebral discL TKA; L total knee revision; HTN, Obesity. dementia; restless leg; CHF; unstable anina; R RC arthroplasty, OA, cervical spondylosis with myelopathy, chronic lower back pain, CKD (Stage?); Fibromyalgia; Hypothyroidism; OSA ( cPap?); hx pneumonia; RA, Type II Diabetes. S/P cardiac cathg, B THA, Lumbar laminectomy, posterior lumbar fusion, B shoulder arthroplasty; total abdominal hysterectomy    Limitations chronic leg swelling and associated pain, chronic OA pain, difficulty walking, impaired balance, muscle weakness, decreased knee hip and shoulder AROM, decreased standing tolerance    Repetition Increases Symptoms    Special Tests - STEMMER sign base of toes bilaterally    Patient Stated Goals get my legs up more. do skin care more often    Effect of Pain on Daily Activities limits functional ambulation,mobility and transfers, limits performance of basic and  instrumental ADLs, productive activitiees, leisure pursuits and social participation, limits body image                        OT Treatments/Exercises (OP) - 10/07/20 0820      Manual Therapy   Manual Therapy Edema management;Manual Lymphatic Drainage (MLD);Compression Bandaging    Edema Management skin care to RLE throughout MLD with low ph castor oil to limit infection risk    Manual Lymphatic Drainage (MLD) RLE MLD utilizing short neck sequence, deep abdominal pathways, functional inguinal LN, and proximal to distal J strokes from groin to ankles.    Compression Bandaging Max A to don L custom stocking. Doctor, general practice  compression wrap to RLE using gradient techniques established in an effort to regain clinical gains for swelling reduction that have been lost sibnce completing Intensive Phase CDT.                  OT Education - 10/07/20 0900    Education Details Continued skilled Pt/caregiver education  And LE ADL training throughout visit for lymphedema self care/ home program, including compression wrapping, compression garment and device wear/care, lymphatic pumping ther ex, simple self-MLD, and skin care. Discussed progress towards goals and OT POC going forward.    Person(s) Educated Patient    Methods Explanation;Demonstration    Comprehension Verbalized understanding;Returned demonstration               OT Long Term Goals - 09/30/20 0821      OT LONG TERM GOAL #1   Title Pt will be able to apply LLE multi-layer, short stretch compression wraps daily with maximum caregiver assistance, using correct gradient techniques independently to return affected limb/s, as closely as possible, to premorbid size and shape, to limit infection risk, and to improve safe functional mobility and ADLs performance.    Baseline dependent    Time 4    Period Days    Status Achieved   DC goal. Pt not currently wrapping. There is no one available at SNF to apply  multilayer wraps at present.     OT LONG TERM GOAL #2   Title Pt will be able to verbalize signs and symptoms of cellulitis infection and identify 4 common lymphedema precautions using printed resource for reference (modified independence) to limit LE progression over time.    Baseline Max A    Time 4    Period Days    Status Achieved      OT LONG TERM GOAL #3   Title Pt will sustain 85%, or greater compliance with all daily LE self-care home program components throughout Intensive Phase CDT, including impeccable skin care, lymphatic pumping therex,  compression wraps;/ garments and devices and simple self MLD, or Flexitouch device daily to ensure optimal limb volume reduction, to limit infection risk and to limit LE progression.   Pt reports 90% compliance. She is dependent on staff at her SNF for assistance, and this has been challenging with staff levels low and travelers often covering vacant positions.   Baseline dependent    Time 12    Period Weeks    Status Partially Met   Pt reports she does not perform skin care daily as directed, but does ~ 4 x weekly. This is an area she will work on going forward.   Target Date 11/03/20      OT LONG TERM GOAL #4   Title Pt to achieve at least 10% LLE limb volume reduction during Intensive Phase CDT with Max CG assistance for wrapping, to improve functional performance of basic and instrumental ADLs, and to limit LE progression.   to date LLE reductions: A-D= 15.7%, E-G= 29.63%, A-G= 22.9%. RLE: A-D: 6.7%, E-G= 15.7%, A-G= 11.2%   Baseline dependent    Time 12    Period Weeks    Status Partially Met   Limb volume continues to fluctuate, somcetimes dramatically. Continue regular OT for CDT in effort to stabilize  within goal range, for at least 6 consecutive months, to limit infection risk, LE progression and further functional decline   Target Date 11/03/20      OT LONG TERM GOAL #5   Title During self-management  phase of CDT Pt will retain limb  volume reductions achieved during Intensive Phase CDT with no more than 3% volume increase using all LE self care components  to limit LE progression and further functional decline.    Baseline dependent    Time 6    Period Months    Status On-going   see comment above. Not yet met, but ongoing   Target Date 11/03/20      Long Term Additional Goals   Additional Long Term Goals Yes      OT LONG TERM GOAL #6   Title Pt will increase compliance w/ LE self care home program by increasing skin care frequency from 4 x to 5 time daily each week to limit LE progression and reduce infection risk.    Baseline Mod A    Time 12    Period Weeks    Status New    Target Date 12/29/20      OT LONG TERM GOAL #7   Title Pt will increase leg elevation during regular seated routines by at least 2 hours per day to limit LE progression and further functional decline.    Baseline Mod A    Time 12    Period Weeks    Status New    Target Date 12/29/20                 Plan - 10/07/20 0944    Clinical Impression Statement BLE more swollen than usual this morning.Pt did not have staff assistance for donning stockings this morning and reports she did not haveassistance with sequenial "pump" over the weeknd. Increased swelling increases infection risk. Will continue to monitor skin clasely. Pt tolerated manual therapy and skin care to RLE this morning without increased pain. Pt demonstrated excellent tolerance for all aspects of OT for CDT. Max A to don bilateral compression garments at end of session. Cont 1 x weekly as per POC to limit progression and further functional decline.    OT Occupational Profile and History Problem Focused Assessment - Including review of records relating to presenting problem    Occupational performance deficits (Please refer to evaluation for details): ADL's;IADL's;Rest and Sleep;Leisure;Social Participation;Other   body image   Body Structure / Function / Physical Skills  ADL;Pain;Balance;Edema;IADL;Decreased knowledge of precautions;Skin integrity;Decreased knowledge of use of DME;ROM    Rehab Potential Good    Clinical Decision Making Multiple treatment options, significant modification of task necessary    Comorbidities Affecting Occupational Performance: Presence of comorbidities impacting occupational performance    Modification or Assistance to Complete Evaluation  Min-Moderate modification of tasks or assist with assess necessary to complete eval    OT Frequency 1x / week    OT Duration 12 weeks    OT Treatment/Interventions Self-care/ADL training;DME and/or AE instruction;Manual lymph drainage;Compression bandaging;Therapeutic activities;Coping strategies training;Therapeutic exercise;Manual Therapy;Patient/family education;Other (comment)   tissue fibrosis massage; skin care to reduce tissue sensativity/ pain and increase hydration to limit infection risk and LE progression   Plan Complete Deconhgestive Therapy (CDT) Intensive and Self -Management phases, consisting of Manual lymphatic Drainage (MLD), skin care, ther ex and compression bandaging    OT Home Exercise Plan lymphatic pumping therex 2-3 x q day, in sequence, 10 reps bilaterally    Recommended Other Services Daytime: 2 Pr. [employer]st EVAREX custom, ccl 2, A-D, 2.5 cm SB at oblique top edge, open toe, t heel.  HOS: [employer] RELAX custom ccl 2 A-D, OT w. posterior zipper    Consulted and Agree  with Plan of Care Patient           Patient will benefit from skilled therapeutic intervention in order to improve the following deficits and impairments:   Body Structure / Function / Physical Skills: ADL,Pain,Balance,Edema,IADL,Decreased knowledge of precautions,Skin integrity,Decreased knowledge of use of DME,ROM       Visit Diagnosis: Lymphedema, not elsewhere classified    Problem List Patient Active Problem List   Diagnosis Date Noted  . Iron deficiency 08/06/2018  . History of total left knee  replacement (TKR) 07/18/2018  . Rectal bleeding 07/05/2018  . Hemorrhoids 07/05/2018  . Anal itching 07/05/2018  . Rotator cuff arthropathy of right shoulder   . Shoulder arthritis 06/15/2017  . Status post revision of total knee replacement, left 04/29/2017  . Polyethylene liner wear following left total knee arthroplasty requiring isolated polyethylene liner exchange ([place]) 03/02/2017  . 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 Unstable angina ([place]) 05/21/2016  . Chronic diastolic heart failure ([place]) 03/29/2016  . Normal coronary arteries 2009 01/14/2015  . Obesity-BMI 40 01/14/2015  . Restless leg 01/14/2015  . Chest pain 01/14/2015  . Back pain 01/14/2015  . Renal insufficiency 01/14/2015  . Dementia- mild memory issues 01/14/2015  . Occult blood positive stool 12/14/2014  . Anemia 12/14/2014  . Family history of colon cancer 12/14/2014  . Change in bowel habits 12/14/2014  . GERD (gastroesophageal reflux disease) 12/14/2014  . Dyspnea 05/17/2012  . Lymphedema 05/17/2012  . Weight gain 05/17/2012  . HTN (hypertension) 05/17/2012    Andrey Spearman, MS, OTR/L, [facility] 10/07/20 9:49 AM   [place] MAIN [facility] SERVICES [address] [place], [place], [address] Phone: [phone]   Fax:  [phone]  Name: ALBENA COMES MRN: [id number] Date of Birth: 30-Mar-1946

## 2020-10-14 ENCOUNTER — Ambulatory Visit: Payer: Self-pay

## 2020-10-14 ENCOUNTER — Ambulatory Visit: Payer: Medicare Other | Admitting: Occupational Therapy

## 2020-10-14 ENCOUNTER — Encounter: Payer: Self-pay | Admitting: Orthopaedic Surgery

## 2020-10-14 ENCOUNTER — Ambulatory Visit (INDEPENDENT_AMBULATORY_CARE_PROVIDER_SITE_OTHER): Payer: Medicare Other | Admitting: Orthopaedic Surgery

## 2020-10-14 ENCOUNTER — Other Ambulatory Visit: Payer: Self-pay

## 2020-10-14 DIAGNOSIS — M25562 Pain in left knee: Secondary | ICD-10-CM | POA: Diagnosis not present

## 2020-10-14 DIAGNOSIS — Z96652 Presence of left artificial knee joint: Secondary | ICD-10-CM

## 2020-10-14 DIAGNOSIS — T84018D Broken internal joint prosthesis, other site, subsequent encounter: Secondary | ICD-10-CM

## 2020-10-14 DIAGNOSIS — G8929 Other chronic pain: Secondary | ICD-10-CM

## 2020-10-14 DIAGNOSIS — I890 Lymphedema, not elsewhere classified: Secondary | ICD-10-CM

## 2020-10-14 DIAGNOSIS — Z96659 Presence of unspecified artificial knee joint: Secondary | ICD-10-CM

## 2020-10-14 NOTE — Progress Notes (Signed)
The patient is someone of seen before.  She is a 75 year old morbidly obese female with chronic left knee issues.  She had her knee replaced several years ago by someone who has since retired.  This was with a rotating platform knee.  She is someone with significant bilateral lower extremity lymphedema.  She is also significantly obese with very large legs.  In 2018 we did upsize her polyliner due to some instability of that knee.  She states that nursing facility.  She has chronic kidney disease and history of congestive heart failure.  She has had worsening with bilateral lymphedema.  She has had cellulitis at least in her right leg twice.  Her daughter was on the phone with me today as well.  The knee does click and grind is painful to her.  The patella on the left side obviously subluxed and is in a subluxation position.  She does have a large soft tissue envelope around her thigh and her leg in general with significant lymphedema.  She does have limited range of motion of that left knee with a chronically subluxating patella.  X-rays of left knee confirm also that the patella subluxation.  Surgery for her knee would require a full revision for realignment of the components.  This would be a very difficult undertaking given her morbid obesity and large size of her legs.  Given her multiple comorbidities she would be at higher infection risk as well.  Also with her significant obesity around her leg I still cannot guarantee that I could optimize the alignment because it would be quite difficult due to the large soft tissue envelope around her knee.  This is something that I will think about.  I would like to see her back in about 6 weeks to see how she is doing overall but no x-rays are needed.

## 2020-10-14 NOTE — Therapy (Signed)
[name] [facility] SERVICES [address] [place], [place], [address] Phone: [phone]   Fax:  [phone]  Occupational Therapy Treatment  Patient Details  Name: [name] MRN: [id number] Date of Birth: [birth year] Referring Provider (OT): Juluis Pitch, MD   Encounter Date: 10/14/2020   OT End of Session - 10/14/20 3335    Visit Number 61    Number of Visits 108    Date for OT Re-Evaluation 11/03/20    OT Start Time 0810    OT Stop Time 0905    OT Time Calculation (min) 55 min    Equipment Utilized During Treatment friction gloves and tyvek slipper    Activity Tolerance Patient tolerated treatment well;No increased pain   Chronic back pain worse since sliding off of bed when matress slid on frame. Denies leg pain   Behavior During Therapy [facility] for tasks assessed/performed   sleepiness throughout session          Past Medical History:  Diagnosis Date  . Anemia   . Anxiety   . Arthritis    "hands, feet" (03/03/2017)  . Brain lesion    2 types  . Cataract   . Cervical spondylosis with myelopathy   . Chest pain    Normal cardiac cath 5/09  . Chronic lower back pain   . CKD (chronic kidney disease), stage III ([place])    [name]  . Congestive heart failure (CHF) ([place])    has a Cardiomems implant   . Constipation   . Dementia ([place])   . Depression   . Dumping syndrome   . Dyspnea    with activity  . Edema   . Fatigue   . Fibromyalgia   . GERD (gastroesophageal reflux disease)   . Headache    "w/high CBG" (03/03/2017)  . History of echocardiogram    a. Echo 03/20/16 (done at [facility] in [place], [place]):  mild LVH, EF 45%, normal diastolic function, mild LAE, MAC, RVSP 25 mmHg  . Hyperlipidemia   . Hypertension   . Hypothyroidism   . Lumbar spondylosis   . Lymphedema    seeing specialist for this  . Migraine    "mostly stopped when I changed my diet" (03/03/2017)  . OSA on CPAP   . Pneumonia  X 1  . PONV (postoperative nausea and vomiting)   . Restless leg syndrome   . Rheumatoid arthritis ([place])   . Stroke [facility])    TIAs "mini strokes"- unsure of last TIA - pt and daughter deny this  . Syncope   . Tremors of nervous system   . Type II diabetes mellitus ([place])     Past Surgical History:  Procedure Laterality Date  . APPENDECTOMY  1966  . BACK SURGERY    . CARDIAC CATHETERIZATION N/A 05/25/2016   Procedure: Right/Left Heart Cath and Coronary Angiography;  Surgeon: Larey Dresser, MD;  Location: [place] [facility];  Service: Cardiovascular;  Laterality: N/A;  . CATARACT EXTRACTION W/ INTRAOCULAR LENS  IMPLANT, BILATERAL Bilateral   . COLONOSCOPY    . CORONARY ANGIOGRAM  2009   Normal coronaries  . EXCISION/RELEASE BURSA HIP Bilateral   . I & D KNEE WITH POLY EXCHANGE Left 03/02/2017   Procedure: Left knee Revision of poly liner;  Surgeon: Mcarthur Rossetti, MD;  Location: [place];  Service: Orthopedics;  Laterality: Left;  . JOINT REPLACEMENT    . KNEE ARTHROSCOPY Bilateral   . LAPAROSCOPIC  CHOLECYSTECTOMY  2015  . LMF  2017   CardiMEMS HF implant for CHF (measures amount of fluid in the heart)  . LUMBAR DISC SURGERY    . LUMBAR LAMINECTOMY/DECOMPRESSION MICRODISCECTOMY Left 12/2005   L2-3 laminectomy and diskectomy/notes 01/06/2011  . POSTERIOR LUMBAR FUSION  04/2006   [name] 01/06/2011; "put cages in"  . REVERSE SHOULDER ARTHROPLASTY Left 06/15/2017   Procedure: REVERSE LEFT SHOULDER ARTHROPLASTY;  Surgeon: Meredith Pel, MD;  Location: [place];  Service: Orthopedics;  Laterality: Left;  . REVERSE SHOULDER ARTHROPLASTY Right 02/01/2018  . REVERSE SHOULDER ARTHROPLASTY Right 02/01/2018   Procedure: RIGHT REVERSE SHOULDER ARTHROPLASTY;  Surgeon: Meredith Pel, MD;  Location: [place];  Service: Orthopedics;  Laterality: Right;  . REVISION TOTAL KNEE ARTHROPLASTY Left 03/02/2017   poly liner/notes 03/02/2017  . RIGHT HEART CATH N/A 11/13/2016   Procedure: Right  Heart Cath with Cardiomems;  Surgeon: Larey Dresser, MD;  Location: [place] [facility];  Service: Cardiovascular;  Laterality: N/A;  . SHOULDER ARTHROSCOPY WITH ROTATOR CUFF REPAIR Right   . SHOULDER OPEN ROTATOR CUFF REPAIR Left 01/2011   [name] 01/29/2011  . TOTAL ABDOMINAL HYSTERECTOMY    . TOTAL KNEE ARTHROPLASTY Left   . TOTAL KNEE ARTHROPLASTY Right 11/18/2012   Procedure: TOTAL KNEE ARTHROPLASTY;  Surgeon: Yvette Rack., MD;  Location: [place];  Service: Orthopedics;  Laterality: Right;  . TUBAL LIGATION    . UPPER GASTROINTESTINAL ENDOSCOPY    . UPPER GI ENDOSCOPY      There were no vitals filed for this visit.   Subjective Assessment - 10/14/20 8101    Subjective  [name] presents to OT for Rx visit 82/108  to address BLE lipo-lymphedema (I89.0).Pt presents with custom compression stockings on, but not well positioned. Garments are bunching at ankles bilaterally Pt reports she sees orth provider today to address ongoing knee pain.    Pertinent History chronic BLE leg swelling and associated pain ~ 40 years; hx herniated intervertebral discL TKA; L total knee revision; HTN, Obesity. dementia; restless leg; CHF; unstable anina; R RC arthroplasty, OA, cervical spondylosis with myelopathy, chronic lower back pain, CKD (Stage?); Fibromyalgia; Hypothyroidism; OSA ( cPap?); hx pneumonia; RA, Type II Diabetes. S/P cardiac cathg, B THA, Lumbar laminectomy, posterior lumbar fusion, B shoulder arthroplasty; total abdominal hysterectomy    Limitations chronic leg swelling and associated pain, chronic OA pain, difficulty walking, impaired balance, muscle weakness, decreased knee hip and shoulder AROM, decreased standing tolerance    Repetition Increases Symptoms    Special Tests - STEMMER sign base of toes bilaterally    Patient Stated Goals get my legs up more. do skin care more often                        OT Treatments/Exercises (OP) - 10/14/20 0919      ADLs   ADL Education  Given Yes      Manual Therapy   Manual Therapy Edema management;Manual Lymphatic Drainage (MLD);Compression Bandaging    Edema Management skin care to LLE throughout MLD with low ph castor oil to limit infection risk    Manual Lymphatic Drainage (MLD) LLE MLD utilizing short neck sequence, deep abdominal pathways, functional inguinal LN, and proximal to distal J strokes from groin to ankles.    Compression Bandaging Max A to don L custom stocking.                  OT Education - 10/14/20  64    Education Details Continued skilled Pt/caregiver education  And LE ADL training throughout visit for lymphedema self care/ home program, including compression wrapping, compression garment and device wear/care, lymphatic pumping ther ex, simple self-MLD, and skin care. Discussed progress towards goals and OT POC going forward.    Person(s) Educated Patient    Methods Explanation;Demonstration    Comprehension Verbalized understanding;Returned demonstration;Need further instruction               OT Long Term Goals - 09/30/20 4098      OT LONG TERM GOAL #1   Title Pt will be able to apply LLE multi-layer, short stretch compression wraps daily with maximum caregiver assistance, using correct gradient techniques independently to return affected limb/s, as closely as possible, to premorbid size and shape, to limit infection risk, and to improve safe functional mobility and ADLs performance.    Baseline dependent    Time 4    Period Days    Status Achieved   DC goal. Pt not currently wrapping. There is no one available at SNF to apply multilayer wraps at present.     OT LONG TERM GOAL #2   Title Pt will be able to verbalize signs and symptoms of cellulitis infection and identify 4 common lymphedema precautions using printed resource for reference (modified [place]) to limit LE progression over time.    Baseline Max A    Time 4    Period Days    Status Achieved      OT LONG TERM  GOAL #3   Title Pt will sustain 85%, or greater compliance with all daily LE self-care home program components throughout Intensive Phase CDT, including impeccable skin care, lymphatic pumping therex,  compression wraps;/ garments and devices and simple self MLD, or Flexitouch device daily to ensure optimal limb volume reduction, to limit infection risk and to limit LE progression.   Pt reports 90% compliance. She is dependent on staff at her SNF for assistance, and this has been challenging with staff levels low and travelers often covering vacant positions.   Baseline dependent    Time 12    Period Weeks    Status Partially Met   Pt reports she does not perform skin care daily as directed, but does ~ 4 x weekly. This is an area she will work on going forward.   Target Date 11/03/20      OT LONG TERM GOAL #4   Title Pt to achieve at least 10% LLE limb volume reduction during Intensive Phase CDT with Max CG assistance for wrapping, to improve functional performance of basic and instrumental ADLs, and to limit LE progression.   to date LLE reductions: A-D= 15.7%, E-G= 29.63%, A-G= 22.9%. RLE: A-D: 6.7%, E-G= 15.7%, A-G= 11.2%   Baseline dependent    Time 12    Period Weeks    Status Partially Met   Limb volume continues to fluctuate, somcetimes dramatically. Continue regular OT for CDT in effort to stabilize  within goal range, for at least 6 consecutive months, to limit infection risk, LE progression and further functional decline   Target Date 11/03/20      OT LONG TERM GOAL #5   Title During self-management phase of CDT Pt will retain limb volume reductions achieved during Intensive Phase CDT with no more than 3% volume increase using all LE self care components  to limit LE progression and further functional decline.    Baseline dependent    Time 6  Period Months    Status On-going   see comment above. Not yet met, but ongoing   Target Date 11/03/20      Long Term Additional Goals    Additional Long Term Goals Yes      OT LONG TERM GOAL #6   Title Pt will increase compliance w/ LE self care home program by increasing skin care frequency from 4 x to 5 time daily each week to limit LE progression and reduce infection risk.    Baseline Mod A    Time 12    Period Weeks    Status New    Target Date 12/29/20      OT LONG TERM GOAL #7   Title Pt will increase leg elevation during regular seated routines by at least 2 hours per day to limit LE progression and further functional decline.    Baseline Mod A    Time 12    Period Weeks    Status New    Target Date 12/29/20                 Plan - 10/14/20 6384    Clinical Impression Statement BLE swelling remains elevatedover time.  Pt tolerated manual therapy and skin care to LLE this morning without increased pain. Pt demonstrated excellent tolerance for all aspects of OT for CDT. Max A to don L compression garments at end of session. Cont 1 x weekly as per POC to limit progression and further functional decline. Resume compression wraps after manual therapy to treatment leg each session going forward in an effort to regain improved conmtrol of intractable , fluctuation limb swelling.    OT Occupational Profile and History Problem Focused Assessment - Including review of records relating to presenting problem    Occupational performance deficits (Please refer to evaluation for details): ADL's;IADL's;Rest and Sleep;Leisure;Social Participation;Other   body image   Body Structure / Function / Physical Skills ADL;Pain;Balance;Edema;IADL;Decreased knowledge of precautions;Skin integrity;Decreased knowledge of use of DME;ROM    Rehab Potential Good    Clinical Decision Making Multiple treatment options, significant modification of task necessary    Comorbidities Affecting Occupational Performance: Presence of comorbidities impacting occupational performance    Modification or Assistance to Complete Evaluation  Min-Moderate  modification of tasks or assist with assess necessary to complete eval    OT Frequency 1x / week    OT Duration 12 weeks    OT Treatment/Interventions Self-care/ADL training;DME and/or AE instruction;Manual lymph drainage;Compression bandaging;Therapeutic activities;Coping strategies training;Therapeutic exercise;Manual Therapy;Patient/family education;Other (comment)   tissue fibrosis massage; skin care to reduce tissue sensativity/ pain and increase hydration to limit infection risk and LE progression   Plan Complete Deconhgestive Therapy (CDT) Intensive and Self -Management phases, consisting of Manual lymphatic Drainage (MLD), skin care, ther ex and compression bandaging    OT Home Exercise Plan lymphatic pumping therex 2-3 x q day, in sequence, 10 reps bilaterally    Recommended Other Services Daytime: 2 Pr. [employer]st EVAREX custom, ccl 2, A-D, 2.5 cm SB at oblique top edge, open toe, t heel.  HOS: Jobst RELAX custom ccl 2 A-D, OT w. posterior zipper    Consulted and Agree with Plan of Care Patient           Patient will benefit from skilled therapeutic intervention in order to improve the following deficits and impairments:   Body Structure / Function / Physical Skills: ADL,Pain,Balance,Edema,IADL,Decreased knowledge of precautions,Skin integrity,Decreased knowledge of use of DME,ROM       Visit  Diagnosis: Lymphedema, not elsewhere classified    Problem List Patient Active Problem List   Diagnosis Date Noted  . Iron deficiency 08/06/2018  . History of total left knee replacement (TKR) 07/18/2018  . Rectal bleeding 07/05/2018  . Hemorrhoids 07/05/2018  . Anal itching 07/05/2018  . Rotator cuff arthropathy of right shoulder   . Shoulder arthritis 06/15/2017  . Status post revision of total knee replacement, left 04/29/2017  . Polyethylene liner wear following left total knee arthroplasty requiring isolated polyethylene liner exchange ([place]) 03/02/2017  . 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 Unstable angina ([place]) 05/21/2016  . Chronic diastolic heart failure ([place]) 03/29/2016  . Normal coronary arteries 2009 01/14/2015  . Obesity-BMI 40 01/14/2015  . Restless leg 01/14/2015  . Chest pain 01/14/2015  . Back pain 01/14/2015  . Renal insufficiency 01/14/2015  . Dementia- mild memory issues 01/14/2015  . Occult blood positive stool 12/14/2014  . Anemia 12/14/2014  . Family history of colon cancer 12/14/2014  . Change in bowel habits 12/14/2014  . GERD (gastroesophageal reflux disease) 12/14/2014  . Dyspnea 05/17/2012  . Lymphedema 05/17/2012  . Weight gain 05/17/2012  . HTN (hypertension) 05/17/2012    Andrey Spearman, MS, OTR/L, [facility] 10/14/20 9:25 AM   [place] MAIN [facility] SERVICES [address] [place], [place], [address] Phone: [phone]   Fax:  [phone]  Name: SEVERINA SYKORA MRN: [id number] Date of Birth: 09/21/1945

## 2020-10-21 ENCOUNTER — Ambulatory Visit: Payer: Medicare Other | Admitting: Occupational Therapy

## 2020-10-21 ENCOUNTER — Other Ambulatory Visit: Payer: Self-pay

## 2020-10-21 DIAGNOSIS — I890 Lymphedema, not elsewhere classified: Secondary | ICD-10-CM

## 2020-10-21 NOTE — Therapy (Signed)
[place] MAIN [facility] SERVICES [address] [place], [place], [address] Phone: [phone]   Fax:  [phone]  Occupational Therapy Treatment  Patient Details  Name: [name] MRN: [id number] Date of Birth: 1946/07/23 Referring Provider (OT): Juluis Pitch, MD   Encounter Date: 10/21/2020   OT End of Session - 10/21/20 0812    Visit Number 93    Number of Visits 108    Date for OT Re-Evaluation 11/03/20    OT Start Time 0800    OT Stop Time 0910    OT Time Calculation (min) 70 min    Equipment Utilized During Treatment friction gloves and tyvek slipper    Activity Tolerance Patient tolerated treatment well;No increased pain   Chronic back pain worse since sliding off of bed when matress slid on frame. Denies leg pain   Behavior During Therapy [facility] for tasks assessed/performed   sleepiness throughout session          Past Medical History:  Diagnosis Date  . Anemia   . Anxiety   . Arthritis    "hands, feet" (03/03/2017)  . Brain lesion    2 types  . Cataract   . Cervical spondylosis with myelopathy   . Chest pain    Normal cardiac cath 5/09  . Chronic lower back pain   . CKD (chronic kidney disease), stage III ([place])    [name]  . Congestive heart failure (CHF) ([place])    has a Cardiomems implant   . Constipation   . Dementia ([place])   . Depression   . Dumping syndrome   . Dyspnea    with activity  . Edema   . Fatigue   . Fibromyalgia   . GERD (gastroesophageal reflux disease)   . Headache    "w/high CBG" (03/03/2017)  . History of echocardiogram    a. Echo 03/20/16 (done at [facility] in [place], [place]):  mild LVH, EF 47%, normal diastolic function, mild LAE, MAC, RVSP 25 mmHg  . Hyperlipidemia   . Hypertension   . Hypothyroidism   . Lumbar spondylosis   . Lymphedema    seeing specialist for this  . Migraine    "mostly stopped when I changed my diet" (03/03/2017)  . OSA on CPAP   . Pneumonia  X 1  . PONV (postoperative nausea and vomiting)   . Restless leg syndrome   . Rheumatoid arthritis ([place])   . Stroke [facility])    TIAs "mini strokes"- unsure of last TIA - pt and daughter deny this  . Syncope   . Tremors of nervous system   . Type II diabetes mellitus ([place])     Past Surgical History:  Procedure Laterality Date  . APPENDECTOMY  1966  . BACK SURGERY    . CARDIAC CATHETERIZATION N/A 05/25/2016   Procedure: Right/Left Heart Cath and Coronary Angiography;  Surgeon: Larey Dresser, MD;  Location: [place] [facility];  Service: Cardiovascular;  Laterality: N/A;  . CATARACT EXTRACTION W/ INTRAOCULAR LENS  IMPLANT, BILATERAL Bilateral   . COLONOSCOPY    . CORONARY ANGIOGRAM  2009   Normal coronaries  . EXCISION/RELEASE BURSA HIP Bilateral   . I & D KNEE WITH POLY EXCHANGE Left 03/02/2017   Procedure: Left knee Revision of poly liner;  Surgeon: Mcarthur Rossetti, MD;  Location: [place];  Service: Orthopedics;  Laterality: Left;  . JOINT REPLACEMENT    . KNEE ARTHROSCOPY Bilateral   . LAPAROSCOPIC  CHOLECYSTECTOMY  2015  . LMF  2017   CardiMEMS HF implant for CHF (measures amount of fluid in the heart)  . LUMBAR DISC SURGERY    . LUMBAR LAMINECTOMY/DECOMPRESSION MICRODISCECTOMY Left 12/2005   L2-3 laminectomy and diskectomy/notes 01/06/2011  . POSTERIOR LUMBAR FUSION  04/2006   [name] 01/06/2011; "put cages in"  . REVERSE SHOULDER ARTHROPLASTY Left 06/15/2017   Procedure: REVERSE LEFT SHOULDER ARTHROPLASTY;  Surgeon: Meredith Pel, MD;  Location: [place];  Service: Orthopedics;  Laterality: Left;  . REVERSE SHOULDER ARTHROPLASTY Right 02/01/2018  . REVERSE SHOULDER ARTHROPLASTY Right 02/01/2018   Procedure: RIGHT REVERSE SHOULDER ARTHROPLASTY;  Surgeon: Meredith Pel, MD;  Location: [place];  Service: Orthopedics;  Laterality: Right;  . REVISION TOTAL KNEE ARTHROPLASTY Left 03/02/2017   poly liner/notes 03/02/2017  . RIGHT HEART CATH N/A 11/13/2016   Procedure: Right  Heart Cath with Cardiomems;  Surgeon: Larey Dresser, MD;  Location: [place] [facility];  Service: Cardiovascular;  Laterality: N/A;  . SHOULDER ARTHROSCOPY WITH ROTATOR CUFF REPAIR Right   . SHOULDER OPEN ROTATOR CUFF REPAIR Left 01/2011   [name] 01/29/2011  . TOTAL ABDOMINAL HYSTERECTOMY    . TOTAL KNEE ARTHROPLASTY Left   . TOTAL KNEE ARTHROPLASTY Right 11/18/2012   Procedure: TOTAL KNEE ARTHROPLASTY;  Surgeon: Yvette Rack., MD;  Location: [place];  Service: Orthopedics;  Laterality: Right;  . TUBAL LIGATION    . UPPER GASTROINTESTINAL ENDOSCOPY    . UPPER GI ENDOSCOPY      There were no vitals filed for this visit.   Subjective Assessment - 10/21/20 0813    Subjective  [name] presents to OT for Rx visit 83/108  to address BLE lipo-lymphedema (I89.0).Pt reports pain in knees is unchanged. Pt has no new concerns this morning. She tells me that herr legs were severly swollen over the weekend.    Pertinent History chronic BLE leg swelling and associated pain ~ 40 years; hx herniated intervertebral discL TKA; L total knee revision; HTN, Obesity. dementia; restless leg; CHF; unstable anina; R RC arthroplasty, OA, cervical spondylosis with myelopathy, chronic lower back pain, CKD (Stage?); Fibromyalgia; Hypothyroidism; OSA ( cPap?); hx pneumonia; RA, Type II Diabetes. S/P cardiac cathg, B THA, Lumbar laminectomy, posterior lumbar fusion, B shoulder arthroplasty; total abdominal hysterectomy    Limitations chronic leg swelling and associated pain, chronic OA pain, difficulty walking, impaired balance, muscle weakness, decreased knee hip and shoulder AROM, decreased standing tolerance    Repetition Increases Symptoms    Special Tests - STEMMER sign base of toes bilaterally    Patient Stated Goals get my legs up more. do skin care more often                        OT Treatments/Exercises (OP) - 10/21/20 0815      ADLs   ADL Education Given Yes      Manual Therapy   Manual  Therapy Edema management    Edema Management skin care to LLE throughout MLD with low ph castor oil to limit infection risk    Manual Lymphatic Drainage (MLD) LLE MLD utilizing short neck sequence, deep abdominal pathways, functional inguinal LN, and proximal to distal J strokes from groin to ankles.    Compression Bandaging Applied 5 layer LLE, gradient compression wrap from ankle to the popliteal fossa as established                  OT Education -  10/21/20 1503    Education Details Continued skilled Pt/caregiver education  And LE ADL training throughout visit for lymphedema self care/ home program, including compression wrapping, compression garment and device wear/care, lymphatic pumping ther ex, simple self-MLD, and skin care. Discussed progress towards goals and OT POC going forward.    Person(s) Educated Patient    Methods Explanation;Demonstration    Comprehension Verbalized understanding;Returned demonstration;Need further instruction               OT Long Term Goals - 09/30/20 6712      OT LONG TERM GOAL #1   Title Pt will be able to apply LLE multi-layer, short stretch compression wraps daily with maximum caregiver assistance, using correct gradient techniques independently to return affected limb/s, as closely as possible, to premorbid size and shape, to limit infection risk, and to improve safe functional mobility and ADLs performance.    Baseline dependent    Time 4    Period Days    Status Achieved   DC goal. Pt not currently wrapping. There is no one available at SNF to apply multilayer wraps at present.     OT LONG TERM GOAL #2   Title Pt will be able to verbalize signs and symptoms of cellulitis infection and identify 4 common lymphedema precautions using printed resource for reference (modified independence) to limit LE progression over time.    Baseline Max A    Time 4    Period Days    Status Achieved      OT LONG TERM GOAL #3   Title Pt will sustain  85%, or greater compliance with all daily LE self-care home program components throughout Intensive Phase CDT, including impeccable skin care, lymphatic pumping therex,  compression wraps;/ garments and devices and simple self MLD, or Flexitouch device daily to ensure optimal limb volume reduction, to limit infection risk and to limit LE progression.   Pt reports 90% compliance. She is dependent on staff at her SNF for assistance, and this has been challenging with staff levels low and travelers often covering vacant positions.   Baseline dependent    Time 12    Period Weeks    Status Partially Met   Pt reports she does not perform skin care daily as directed, but does ~ 4 x weekly. This is an area she will work on going forward.   Target Date 11/03/20      OT LONG TERM GOAL #4   Title Pt to achieve at least 10% LLE limb volume reduction during Intensive Phase CDT with Max CG assistance for wrapping, to improve functional performance of basic and instrumental ADLs, and to limit LE progression.   to date LLE reductions: A-D= 15.7%, E-G= 29.63%, A-G= 22.9%. RLE: A-D: 6.7%, E-G= 15.7%, A-G= 11.2%   Baseline dependent    Time 12    Period Weeks    Status Partially Met   Limb volume continues to fluctuate, somcetimes dramatically. Continue regular OT for CDT in effort to stabilize  within goal range, for at least 6 consecutive months, to limit infection risk, LE progression and further functional decline   Target Date 11/03/20      OT LONG TERM GOAL #5   Title During self-management phase of CDT Pt will retain limb volume reductions achieved during Intensive Phase CDT with no more than 3% volume increase using all LE self care components  to limit LE progression and further functional decline.    Baseline dependent    Time  6    Period Months    Status On-going   see comment above. Not yet met, but ongoing   Target Date 11/03/20      Long Term Additional Goals   Additional Long Term Goals Yes       OT LONG TERM GOAL #6   Title Pt will increase compliance w/ LE self care home program by increasing skin care frequency from 4 x to 5 time daily each week to limit LE progression and reduce infection risk.    Baseline Mod A    Time 12    Period Weeks    Status New    Target Date 12/29/20      OT LONG TERM GOAL #7   Title Pt will increase leg elevation during regular seated routines by at least 2 hours per day to limit LE progression and further functional decline.    Baseline Mod A    Time 12    Period Weeks    Status New    Target Date 12/29/20                 Plan - 10/21/20 0920    Clinical Impression Statement [name] continues to present with increased BLE swelling. Raspy voice is also more and fore frequent when she's here for CDT. Fluid management issues and raspy voice suggest medications issues possibly. The facility where Pt resoides also has frequent ongoing staff turnover according to the patient, so consistency with  home program support may also be limited. I  Provided MLD to LLE as previously established and recommenced clinical compression wrap today in an effort to regain better control of leg swelling. Pt in agreement with plan to apply compression wraps after clinical Rx sessions going forweard. Cont as per POC- OT 1 x weekly and PRN.    OT Occupational Profile and History Problem Focused Assessment - Including review of records relating to presenting problem    Occupational performance deficits (Please refer to evaluation for details): ADL's;IADL's;Rest and Sleep;Leisure;Social Participation;Other   body image   Body Structure / Function / Physical Skills ADL;Pain;Balance;Edema;IADL;Decreased knowledge of precautions;Skin integrity;Decreased knowledge of use of DME;ROM    Rehab Potential Good    Clinical Decision Making Multiple treatment options, significant modification of task necessary    Comorbidities Affecting Occupational Performance: Presence of  comorbidities impacting occupational performance    Modification or Assistance to Complete Evaluation  Min-Moderate modification of tasks or assist with assess necessary to complete eval    OT Frequency 1x / week    OT Duration 12 weeks    OT Treatment/Interventions Self-care/ADL training;DME and/or AE instruction;Manual lymph drainage;Compression bandaging;Therapeutic activities;Coping strategies training;Therapeutic exercise;Manual Therapy;Patient/family education;Other (comment)   tissue fibrosis massage; skin care to reduce tissue sensativity/ pain and increase hydration to limit infection risk and LE progression   Plan Complete Deconhgestive Therapy (CDT) Intensive and Self -Management phases, consisting of Manual lymphatic Drainage (MLD), skin care, ther ex and compression bandaging    OT Home Exercise Plan lymphatic pumping therex 2-3 x q day, in sequence, 10 reps bilaterally    Recommended Other Services Daytime: 2 Pr. Jobst EVAREX custom, ccl 2, A-D, 2.5 cm SB at oblique top edge, open toe, t heel.  HOS: Jobst RELAX custom ccl 2 A-D, OT w. posterior zipper    Consulted and Agree with Plan of Care Patient           Patient will benefit from skilled therapeutic intervention in order to improve the  following deficits and impairments:   Body Structure / Function / Physical Skills: ADL,Pain,Balance,Edema,IADL,Decreased knowledge of precautions,Skin integrity,Decreased knowledge of use of DME,ROM       Visit Diagnosis: Lymphedema, not elsewhere classified    Problem List Patient Active Problem List   Diagnosis Date Noted  . Iron deficiency 08/06/2018  . History of total left knee replacement (TKR) 07/18/2018  . Rectal bleeding 07/05/2018  . Hemorrhoids 07/05/2018  . Anal itching 07/05/2018  . Rotator cuff arthropathy of right shoulder   . Shoulder arthritis 06/15/2017  . Status post revision of total knee replacement, left 04/29/2017  . Polyethylene liner wear following left  total knee arthroplasty requiring isolated polyethylene liner exchange ([place]) 03/02/2017  . 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 Unstable angina ([place]) 05/21/2016  . Chronic diastolic heart failure ([place]) 03/29/2016  . Normal coronary arteries 2009 01/14/2015  . Obesity-BMI 40 01/14/2015  . Restless leg 01/14/2015  . Chest pain 01/14/2015  . Back pain 01/14/2015  . Renal insufficiency 01/14/2015  . Dementia- mild memory issues 01/14/2015  . Occult blood positive stool 12/14/2014  . Anemia 12/14/2014  . Family history of colon cancer 12/14/2014  . Change in bowel habits 12/14/2014  . GERD (gastroesophageal reflux disease) 12/14/2014  . Dyspnea 05/17/2012  . Lymphedema 05/17/2012  . Weight gain 05/17/2012  . HTN (hypertension) 05/17/2012    Andrey Spearman, MS, OTR/L, [facility] 10/21/20 3:04 PM    [place] MAIN [facility] SERVICES [address] [place], [place], [address] Phone: [phone]   Fax:  [phone]  Name: [name] MRN: [id number] Date of Birth: 11-08-1945

## 2020-10-28 ENCOUNTER — Other Ambulatory Visit: Payer: Self-pay

## 2020-10-28 ENCOUNTER — Ambulatory Visit: Payer: Medicare Other | Attending: Nephrology | Admitting: Occupational Therapy

## 2020-10-28 DIAGNOSIS — I890 Lymphedema, not elsewhere classified: Secondary | ICD-10-CM

## 2020-10-28 NOTE — Therapy (Signed)
[place] MAIN [facility] SERVICES [address] [place], [place], [address] Phone: [phone]   Fax:  [phone]  Occupational Therapy Treatment  Patient Details  Name: [name] MRN: [id number] Date of Birth: March 19, 1946 Referring Provider (OT): Juluis Pitch, MD   Encounter Date: 10/28/2020   OT End of Session - 10/28/20 0816    Visit Number 52    Number of Visits 108    Date for OT Re-Evaluation 11/03/20    OT Start Time 0800    OT Stop Time 0900    OT Time Calculation (min) 60 min    Equipment Utilized During Treatment friction gloves and tyvek slipper    Activity Tolerance Patient tolerated treatment well;No increased pain   Chronic back pain worse since sliding off of bed when matress slid on frame. Denies leg pain   Behavior During Therapy [facility] for tasks assessed/performed   sleepiness throughout session          Past Medical History:  Diagnosis Date  . Anemia   . Anxiety   . Arthritis    "hands, feet" (03/03/2017)  . Brain lesion    2 types  . Cataract   . Cervical spondylosis with myelopathy   . Chest pain    Normal cardiac cath 5/09  . Chronic lower back pain   . CKD (chronic kidney disease), stage III ([place])    [name]  . Congestive heart failure (CHF) ([place])    has a Cardiomems implant   . Constipation   . Dementia ([place])   . Depression   . Dumping syndrome   . Dyspnea    with activity  . Edema   . Fatigue   . Fibromyalgia   . GERD (gastroesophageal reflux disease)   . Headache    "w/high CBG" (03/03/2017)  . History of echocardiogram    a. Echo 03/20/16 (done at [facility] in [place], [place]):  mild LVH, EF 17%, normal diastolic function, mild LAE, MAC, RVSP 25 mmHg  . Hyperlipidemia   . Hypertension   . Hypothyroidism   . Lumbar spondylosis   . Lymphedema    seeing specialist for this  . Migraine    "mostly stopped when I changed my diet" (03/03/2017)  . OSA on CPAP   . Pneumonia X  1  . PONV (postoperative nausea and vomiting)   . Restless leg syndrome   . Rheumatoid arthritis ([place])   . Stroke [facility])    TIAs "mini strokes"- unsure of last TIA - pt and daughter deny this  . Syncope   . Tremors of nervous system   . Type II diabetes mellitus ([place])     Past Surgical History:  Procedure Laterality Date  . APPENDECTOMY  1966  . BACK SURGERY    . CARDIAC CATHETERIZATION N/A 05/25/2016   Procedure: Right/Left Heart Cath and Coronary Angiography;  Surgeon: Larey Dresser, MD;  Location: [place] CV LAB;  Service: Cardiovascular;  Laterality: N/A;  . CATARACT EXTRACTION W/ INTRAOCULAR LENS  IMPLANT, BILATERAL Bilateral   . COLONOSCOPY    . CORONARY ANGIOGRAM  2009   Normal coronaries  . EXCISION/RELEASE BURSA HIP Bilateral   . I & D KNEE WITH POLY EXCHANGE Left 03/02/2017   Procedure: Left knee Revision of poly liner;  Surgeon: Mcarthur Rossetti, MD;  Location: [place];  Service: Orthopedics;  Laterality: Left;  . JOINT REPLACEMENT    . KNEE ARTHROSCOPY Bilateral   . LAPAROSCOPIC  CHOLECYSTECTOMY  2015  . LMF  2017   CardiMEMS HF implant for CHF (measures amount of fluid in the heart)  . LUMBAR DISC SURGERY    . LUMBAR LAMINECTOMY/DECOMPRESSION MICRODISCECTOMY Left 12/2005   L2-3 laminectomy and diskectomy/notes 01/06/2011  . POSTERIOR LUMBAR FUSION  04/2006   [name] 01/06/2011; "put cages in"  . REVERSE SHOULDER ARTHROPLASTY Left 06/15/2017   Procedure: REVERSE LEFT SHOULDER ARTHROPLASTY;  Surgeon: Meredith Pel, MD;  Location: [place];  Service: Orthopedics;  Laterality: Left;  . REVERSE SHOULDER ARTHROPLASTY Right 02/01/2018  . REVERSE SHOULDER ARTHROPLASTY Right 02/01/2018   Procedure: RIGHT REVERSE SHOULDER ARTHROPLASTY;  Surgeon: Meredith Pel, MD;  Location: [place];  Service: Orthopedics;  Laterality: Right;  . REVISION TOTAL KNEE ARTHROPLASTY Left 03/02/2017   poly liner/notes 03/02/2017  . RIGHT HEART CATH N/A 11/13/2016   Procedure: Right  Heart Cath with Cardiomems;  Surgeon: Larey Dresser, MD;  Location: [place] [facility];  Service: Cardiovascular;  Laterality: N/A;  . SHOULDER ARTHROSCOPY WITH ROTATOR CUFF REPAIR Right   . SHOULDER OPEN ROTATOR CUFF REPAIR Left 01/2011   [name] 01/29/2011  . TOTAL ABDOMINAL HYSTERECTOMY    . TOTAL KNEE ARTHROPLASTY Left   . TOTAL KNEE ARTHROPLASTY Right 11/18/2012   Procedure: TOTAL KNEE ARTHROPLASTY;  Surgeon: Yvette Rack., MD;  Location: [place];  Service: Orthopedics;  Laterality: Right;  . TUBAL LIGATION    . UPPER GASTROINTESTINAL ENDOSCOPY    . UPPER GI ENDOSCOPY      There were no vitals filed for this visit.   Subjective Assessment - 10/28/20 0816    Subjective  [name] presents to OT for Rx visit 84/108  to address BLE lipo-lymphedema (I89.0).Pt reports  8/10 L   neck, shoulder and elbow pain. Lymphedema related pain is unrated, but Pt describes as heavy and tight. Pt is slumped forward in transport chair asleep in waiting room at beginning of session. She is wearing R custom  compression garment only, and she tells me she forgot to bring her wraps because she was rushed out    by transportation and lmistakenly left her bag on the table. Provided moist heat pack to L shoulder and neck throughout session to facilitate increased relaxation and pain relief during session for more effective lymphatic function.    Pertinent History chronic BLE leg swelling and associated pain ~ 40 years; hx herniated intervertebral discL TKA; L total knee revision; HTN, Obesity. dementia; restless leg; CHF; unstable anina; R RC arthroplasty, OA, cervical spondylosis with myelopathy, chronic lower back pain, CKD (Stage?); Fibromyalgia; Hypothyroidism; OSA ( cPap?); hx pneumonia; RA, Type II Diabetes. S/P cardiac cathg, B THA, Lumbar laminectomy, posterior lumbar fusion, B shoulder arthroplasty; total abdominal hysterectomy    Limitations chronic leg swelling and associated pain, chronic OA pain, difficulty  walking, impaired balance, muscle weakness, decreased knee hip and shoulder AROM, decreased standing tolerance    Repetition Increases Symptoms    Special Tests - STEMMER sign base of toes bilaterally    Patient Stated Goals get my legs up more. do skin care more often                        OT Treatments/Exercises (OP) - 10/28/20 1301      ADLs   ADL Education Given Yes      Manual Therapy   Manual Therapy Edema management;Manual Lymphatic Drainage (MLD)    Manual therapy comments heat pack to L neck  and shoulder    Edema Management skin care to LLE throughout MLD with low ph castor oil to limit infection risk    Manual Lymphatic Drainage (MLD) LLE MLD utilizing short neck sequence, deep abdominal pathways, functional inguinal LN, and proximal to distal J strokes from groin to ankles.    Compression Bandaging LLE custom compression garments and wraps unavailable                  OT Education - 10/28/20 1303    Education Details Continued skilled Pt/caregiver education  And LE ADL training throughout visit for lymphedema self care/ home program, including compression wrapping, compression garment and device wear/care, lymphatic pumping ther ex, simple self-MLD, and skin care. Discussed progress towards goals and OT POC going forward.    Person(s) Educated Patient    Methods Explanation;Demonstration    Comprehension Verbalized understanding;Returned demonstration;Need further instruction               OT Long Term Goals - 09/30/20 1884      OT LONG TERM GOAL #1   Title Pt will be able to apply LLE multi-layer, short stretch compression wraps daily with maximum caregiver assistance, using correct gradient techniques independently to return affected limb/s, as closely as possible, to premorbid size and shape, to limit infection risk, and to improve safe functional mobility and ADLs performance.    Baseline dependent    Time 4    Period Days    Status  Achieved   DC goal. Pt not currently wrapping. There is no one available at SNF to apply multilayer wraps at present.     OT LONG TERM GOAL #2   Title Pt will be able to verbalize signs and symptoms of cellulitis infection and identify 4 common lymphedema precautions using printed resource for reference (modified independence) to limit LE progression over time.    Baseline Max A    Time 4    Period Days    Status Achieved      OT LONG TERM GOAL #3   Title Pt will sustain 85%, or greater compliance with all daily LE self-care home program components throughout Intensive Phase CDT, including impeccable skin care, lymphatic pumping therex,  compression wraps;/ garments and devices and simple self MLD, or Flexitouch device daily to ensure optimal limb volume reduction, to limit infection risk and to limit LE progression.   Pt reports 90% compliance. She is dependent on staff at her SNF for assistance, and this has been challenging with staff levels low and travelers often covering vacant positions.   Baseline dependent    Time 12    Period Weeks    Status Partially Met   Pt reports she does not perform skin care daily as directed, but does ~ 4 x weekly. This is an area she will work on going forward.   Target Date 11/03/20      OT LONG TERM GOAL #4   Title Pt to achieve at least 10% LLE limb volume reduction during Intensive Phase CDT with Max CG assistance for wrapping, to improve functional performance of basic and instrumental ADLs, and to limit LE progression.   to date LLE reductions: A-D= 15.7%, E-G= 29.63%, A-G= 22.9%. RLE: A-D: 6.7%, E-G= 15.7%, A-G= 11.2%   Baseline dependent    Time 12    Period Weeks    Status Partially Met   Limb volume continues to fluctuate, somcetimes dramatically. Continue regular OT for CDT in effort to stabilize  within  goal range, for at least 6 consecutive months, to limit infection risk, LE progression and further functional decline   Target Date 11/03/20       OT LONG TERM GOAL #5   Title During self-management phase of CDT Pt will retain limb volume reductions achieved during Intensive Phase CDT with no more than 3% volume increase using all LE self care components  to limit LE progression and further functional decline.    Baseline dependent    Time 6    Period Months    Status On-going   see comment above. Not yet met, but ongoing   Target Date 11/03/20      Long Term Additional Goals   Additional Long Term Goals Yes      OT LONG TERM GOAL #6   Title Pt will increase compliance w/ LE self care home program by increasing skin care frequency from 4 x to 5 time daily each week to limit LE progression and reduce infection risk.    Baseline Mod A    Time 12    Period Weeks    Status New    Target Date 12/29/20      OT LONG TERM GOAL #7   Title Pt will increase leg elevation during regular seated routines by at least 2 hours per day to limit LE progression and further functional decline.    Baseline Mod A    Time 12    Period Weeks    Status New    Target Date 12/29/20                 Plan - 10/28/20 1246    Clinical Impression Statement BLE limb volumes continue to trend upwards. Today Pt presents without LLE compression garment in place and R knee high garment is not pulled up fully into place. B leg swelling is not well controlled this morning. Pt tolerated MLD and skin care to LLE/LLQ without increased pain during session. Moist heat pack to L lateral neck and shoulder during MLD eased pain and alowed Pt to relax for more effective treatment. Unable to apply LLE knee length gradient compression wrap after MLD as Pt forgot bandages in her rush to get transportation to OT. Cont as per POC. Cont to apply cinical wrap after each session in effort to better manage LE and limit progression over time/.    OT Occupational Profile and History Problem Focused Assessment - Including review of records relating to presenting problem     Occupational performance deficits (Please refer to evaluation for details): ADL's;IADL's;Rest and Sleep;Leisure;Social Participation;Other   body image   Body Structure / Function / Physical Skills ADL;Pain;Balance;Edema;IADL;Decreased knowledge of precautions;Skin integrity;Decreased knowledge of use of DME;ROM    Rehab Potential Good    Clinical Decision Making Multiple treatment options, significant modification of task necessary    Comorbidities Affecting Occupational Performance: Presence of comorbidities impacting occupational performance    Modification or Assistance to Complete Evaluation  Min-Moderate modification of tasks or assist with assess necessary to complete eval    OT Frequency 1x / week    OT Duration 12 weeks    OT Treatment/Interventions Self-care/ADL training;DME and/or AE instruction;Manual lymph drainage;Compression bandaging;Therapeutic activities;Coping strategies training;Therapeutic exercise;Manual Therapy;Patient/family education;Other (comment)   tissue fibrosis massage; skin care to reduce tissue sensativity/ pain and increase hydration to limit infection risk and LE progression   Plan Complete Deconhgestive Therapy (CDT) Intensive and Self -Management phases, consisting of Manual lymphatic Drainage (MLD), skin care, ther ex  and compression bandaging    OT Home Exercise Plan lymphatic pumping therex 2-3 x q day, in sequence, 10 reps bilaterally    Recommended Other Services Daytime: 2 Pr. [employer]st EVAREX custom, ccl 2, A-D, 2.5 cm SB at oblique top edge, open toe, t heel.  HOS: [employer]st RELAX custom ccl 2 A-D, OT w. posterior zipper    Consulted and Agree with Plan of Care Patient           Patient will benefit from skilled therapeutic intervention in order to improve the following deficits and impairments:   Body Structure / Function / Physical Skills: ADL,Pain,Balance,Edema,IADL,Decreased knowledge of precautions,Skin integrity,Decreased knowledge of use of DME,ROM        Visit Diagnosis: Lymphedema, not elsewhere classified    Problem List Patient Active Problem List   Diagnosis Date Noted  . Iron deficiency 08/06/2018  . History of total left knee replacement (TKR) 07/18/2018  . Rectal bleeding 07/05/2018  . Hemorrhoids 07/05/2018  . Anal itching 07/05/2018  . Rotator cuff arthropathy of right shoulder   . Shoulder arthritis 06/15/2017  . Status post revision of total knee replacement, left 04/29/2017  . Polyethylene liner wear following left total knee arthroplasty requiring isolated polyethylene liner exchange ([place]) 03/02/2017  . 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 Unstable angina ([place]) 05/21/2016  . Chronic diastolic heart failure ([place]) 03/29/2016  . Normal coronary arteries 2009 01/14/2015  . Obesity-BMI 40 01/14/2015  . Restless leg 01/14/2015  . Chest pain 01/14/2015  . Back pain 01/14/2015  . Renal insufficiency 01/14/2015  . Dementia- mild memory issues 01/14/2015  . Occult blood positive stool 12/14/2014  . Anemia 12/14/2014  . Family history of colon cancer 12/14/2014  . Change in bowel habits 12/14/2014  . GERD (gastroesophageal reflux disease) 12/14/2014  . Dyspnea 05/17/2012  . Lymphedema 05/17/2012  . Weight gain 05/17/2012  . HTN (hypertension) 05/17/2012    Andrey Spearman, MS, OTR/L, [facility] 10/28/20 1:05 PM   [place] MAIN [facility] SERVICES [address] [place], [place], [address] Phone: [phone]   Fax:  [phone]  Name: [name] MRN: [id number] Date of Birth: 21-Nov-1945

## 2020-11-04 ENCOUNTER — Other Ambulatory Visit: Payer: Self-pay

## 2020-11-04 ENCOUNTER — Ambulatory Visit: Payer: Medicare Other | Admitting: Occupational Therapy

## 2020-11-04 DIAGNOSIS — I890 Lymphedema, not elsewhere classified: Secondary | ICD-10-CM | POA: Diagnosis not present

## 2020-11-04 NOTE — Therapy (Signed)
[place] MAIN [facility] SERVICES [address] [place], [place], [address] Phone: [phone]   Fax:  [phone]  Occupational Therapy Treatment  Recert 3/38/25 - 0/53/97  Patient Details  Name: [name] MRN: [id number] Date of Birth: September 18, 1945 Referring Provider (OT): Juluis Pitch, MD   Encounter Date: 11/04/2020   OT End of Session - 11/04/20 0808    Visit Number 41    Number of Visits 108    Date for OT Re-Evaluation 02/02/21    OT Start Time 0800    OT Stop Time 0905    OT Time Calculation (min) 65 min    Equipment Utilized During Treatment friction gloves and tyvek slipper    Activity Tolerance Patient tolerated treatment well;No increased pain   Chronic back pain worse since sliding off of bed when matress slid on frame. Denies leg pain   Behavior During Therapy [facility] for tasks assessed/performed   sleepiness throughout session          Past Medical History:  Diagnosis Date  . Anemia   . Anxiety   . Arthritis    "hands, feet" (03/03/2017)  . Brain lesion    2 types  . Cataract   . Cervical spondylosis with myelopathy   . Chest pain    Normal cardiac cath 5/09  . Chronic lower back pain   . CKD (chronic kidney disease), stage III ([place])    [name]  . Congestive heart failure (CHF) ([place])    has a Cardiomems implant   . Constipation   . Dementia ([place])   . Depression   . Dumping syndrome   . Dyspnea    with activity  . Edema   . Fatigue   . Fibromyalgia   . GERD (gastroesophageal reflux disease)   . Headache    "w/high CBG" (03/03/2017)  . History of echocardiogram    a. Echo 03/20/16 (done at [facility] in [place], [place]):  mild LVH, EF 02%, normal diastolic function, mild LAE, MAC, RVSP 25 mmHg  . Hyperlipidemia   . Hypertension   . Hypothyroidism   . Lumbar spondylosis   . Lymphedema    seeing specialist for this  . Migraine    "mostly stopped when I changed my diet" (03/03/2017)  .  OSA on CPAP   . Pneumonia X 1  . PONV (postoperative nausea and vomiting)   . Restless leg syndrome   . Rheumatoid arthritis ([place])   . Stroke [facility])    TIAs "mini strokes"- unsure of last TIA - pt and daughter deny this  . Syncope   . Tremors of nervous system   . Type II diabetes mellitus ([place])     Past Surgical History:  Procedure Laterality Date  . APPENDECTOMY  1966  . BACK SURGERY    . CARDIAC CATHETERIZATION N/A 05/25/2016   Procedure: Right/Left Heart Cath and Coronary Angiography;  Surgeon: Larey Dresser, MD;  Location: [place] [facility];  Service: Cardiovascular;  Laterality: N/A;  . CATARACT EXTRACTION W/ INTRAOCULAR LENS  IMPLANT, BILATERAL Bilateral   . COLONOSCOPY    . CORONARY ANGIOGRAM  2009   Normal coronaries  . EXCISION/RELEASE BURSA HIP Bilateral   . I & D KNEE WITH POLY EXCHANGE Left 03/02/2017   Procedure: Left knee Revision of poly liner;  Surgeon: Mcarthur Rossetti, MD;  Location: [place];  Service: Orthopedics;  Laterality: Left;  . JOINT REPLACEMENT    . KNEE ARTHROSCOPY  Bilateral   . LAPAROSCOPIC CHOLECYSTECTOMY  2015  . LMF  2017   CardiMEMS HF implant for CHF (measures amount of fluid in the heart)  . LUMBAR DISC SURGERY    . LUMBAR LAMINECTOMY/DECOMPRESSION MICRODISCECTOMY Left 12/2005   L2-3 laminectomy and diskectomy/notes 01/06/2011  . POSTERIOR LUMBAR FUSION  04/2006   [name] 01/06/2011; "put cages in"  . REVERSE SHOULDER ARTHROPLASTY Left 06/15/2017   Procedure: REVERSE LEFT SHOULDER ARTHROPLASTY;  Surgeon: Meredith Pel, MD;  Location: [place];  Service: Orthopedics;  Laterality: Left;  . REVERSE SHOULDER ARTHROPLASTY Right 02/01/2018  . REVERSE SHOULDER ARTHROPLASTY Right 02/01/2018   Procedure: RIGHT REVERSE SHOULDER ARTHROPLASTY;  Surgeon: Meredith Pel, MD;  Location: [place];  Service: Orthopedics;  Laterality: Right;  . REVISION TOTAL KNEE ARTHROPLASTY Left 03/02/2017   poly liner/notes 03/02/2017  . RIGHT HEART CATH N/A  11/13/2016   Procedure: Right Heart Cath with Cardiomems;  Surgeon: Larey Dresser, MD;  Location: [place] [facility];  Service: Cardiovascular;  Laterality: N/A;  . SHOULDER ARTHROSCOPY WITH ROTATOR CUFF REPAIR Right   . SHOULDER OPEN ROTATOR CUFF REPAIR Left 01/2011   [name] 01/29/2011  . TOTAL ABDOMINAL HYSTERECTOMY    . TOTAL KNEE ARTHROPLASTY Left   . TOTAL KNEE ARTHROPLASTY Right 11/18/2012   Procedure: TOTAL KNEE ARTHROPLASTY;  Surgeon: Yvette Rack., MD;  Location: [place];  Service: Orthopedics;  Laterality: Right;  . TUBAL LIGATION    . UPPER GASTROINTESTINAL ENDOSCOPY    . UPPER GI ENDOSCOPY      There were no vitals filed for this visit.   Subjective Assessment - 11/04/20 0810    Subjective  [name] presents to OT for Rx visit 85/108  to address BLE lipo-lymphedema (I89.0).Pt reports with BLE knee length custom compression stockings in place. Pt reports L leg pain 6/10, and 0/10 R leg pain. " Mt knees hurt and my ankle to my knee hurts.  Pt did not bring wraps as requested. Pt is    Pertinent History chronic BLE leg swelling and associated pain ~ 40 years; hx herniated intervertebral discL TKA; L total knee revision; HTN, Obesity. dementia; restless leg; CHF; unstable anina; R RC arthroplasty, OA, cervical spondylosis with myelopathy, chronic lower back pain, CKD (Stage?); Fibromyalgia; Hypothyroidism; OSA ( cPap?); hx pneumonia; RA, Type II Diabetes. S/P cardiac cathg, B THA, Lumbar laminectomy, posterior lumbar fusion, B shoulder arthroplasty; total abdominal hysterectomy    Limitations chronic leg swelling and associated pain, chronic OA pain, difficulty walking, impaired balance, muscle weakness, decreased knee hip and shoulder AROM, decreased standing tolerance    Repetition Increases Symptoms    Special Tests - STEMMER sign base of toes bilaterally    Patient Stated Goals get my legs up more. do skin care more often                        OT  Treatments/Exercises (OP) - 11/04/20 0816      ADLs   ADL Education Given Yes      Manual Therapy   Manual Therapy Edema management;Manual Lymphatic Drainage (MLD)    Edema Management skin care to LLE throughout MLD with low ph castor oil to limit infection risk    Manual Lymphatic Drainage (MLD) LLE MLD utilizing short neck sequence, deep abdominal pathways, functional inguinal LN, and proximal to distal J strokes from groin to ankles.    Compression Bandaging LLE custom compression garments and wraps unavailable  OT Education - 11/04/20 0817    Education Details Continued skilled Pt/caregiver education  And LE ADL training throughout visit for lymphedema self care/ home program, including compression wrapping, compression garment and device wear/care, lymphatic pumping ther ex, simple self-MLD, and skin care. Discussed progress towards goals and OT POC going forward.    Person(s) Educated Patient    Methods Explanation;Demonstration    Comprehension Verbalized understanding;Returned demonstration;Need further instruction               OT Long Term Goals - 09/30/20 6283      OT LONG TERM GOAL #1   Title Pt will be able to apply LLE multi-layer, short stretch compression wraps daily with maximum caregiver assistance, using correct gradient techniques independently to return affected limb/s, as closely as possible, to premorbid size and shape, to limit infection risk, and to improve safe functional mobility and ADLs performance.    Baseline dependent    Time 4    Period Days    Status Achieved   DC goal. Pt not currently wrapping. There is no one available at SNF to apply multilayer wraps at present.     OT LONG TERM GOAL #2   Title Pt will be able to verbalize signs and symptoms of cellulitis infection and identify 4 common lymphedema precautions using printed resource for reference (modified independence) to limit LE progression over time.    Baseline Max  A    Time 4    Period Days    Status Achieved      OT LONG TERM GOAL #3   Title Pt will sustain 85%, or greater compliance with all daily LE self-care home program components throughout Intensive Phase CDT, including impeccable skin care, lymphatic pumping therex,  compression wraps;/ garments and devices and simple self MLD, or Flexitouch device daily to ensure optimal limb volume reduction, to limit infection risk and to limit LE progression.   Pt reports 90% compliance. She is dependent on staff at her SNF for assistance, and this has been challenging with staff levels low and travelers often covering vacant positions.   Baseline dependent    Time 12    Period Weeks    Status Partially Met   Pt reports she does not perform skin care daily as directed, but does ~ 4 x weekly. This is an area she will work on going forward.   Target Date 11/03/20      OT LONG TERM GOAL #4   Title Pt to achieve at least 10% LLE limb volume reduction during Intensive Phase CDT with Max CG assistance for wrapping, to improve functional performance of basic and instrumental ADLs, and to limit LE progression.   to date LLE reductions: A-D= 15.7%, E-G= 29.63%, A-G= 22.9%. RLE: A-D: 6.7%, E-G= 15.7%, A-G= 11.2%   Baseline dependent    Time 12    Period Weeks    Status Partially Met   Limb volume continues to fluctuate, somcetimes dramatically. Continue regular OT for CDT in effort to stabilize  within goal range, for at least 6 consecutive months, to limit infection risk, LE progression and further functional decline   Target Date 11/03/20      OT LONG TERM GOAL #5   Title During self-management phase of CDT Pt will retain limb volume reductions achieved during Intensive Phase CDT with no more than 3% volume increase using all LE self care components  to limit LE progression and further functional decline.    Baseline dependent  Time 6    Period Months    Status On-going   see comment above. Not yet met, but  ongoing   Target Date 11/03/20      Long Term Additional Goals   Additional Long Term Goals Yes      OT LONG TERM GOAL #6   Title Pt will increase compliance w/ LE self care home program by increasing skin care frequency from 4 x to 5 time daily each week to limit LE progression and reduce infection risk.    Baseline Mod A    Time 12    Period Weeks    Status New    Target Date 12/29/20      OT LONG TERM GOAL #7   Title Pt will increase leg elevation during regular seated routines by at least 2 hours per day to limit LE progression and further functional decline.    Baseline Mod A    Time 12    Period Weeks    Status New    Target Date 12/29/20                 Plan - 11/04/20 0920    Clinical Impression Statement BLE limb volumes appear decreased since last visit. Fluctuation continueswith upward trend more frequent. Garments worn to clinic today appear worn and in need of replacement. If leg swelling remains decreased next visit we will complete anatomical measurements bilaterally for replacements. Pt requires 2 Pr custom flat knit compression stockings for optimal hygiene, "one to wash and one to wear". Pt tolerated MLD and skin care to LLE this morning without increased pain. Max A to don stocking after manual therapy. Cont as per POC at 1 x weekly to limit LE progression and infection risk.    OT Occupational Profile and History Problem Focused Assessment - Including review of records relating to presenting problem    Occupational performance deficits (Please refer to evaluation for details): ADL's;IADL's;Rest and Sleep;Leisure;Social Participation;Other   body image   Body Structure / Function / Physical Skills ADL;Pain;Balance;Edema;IADL;Decreased knowledge of precautions;Skin integrity;Decreased knowledge of use of DME;ROM    Rehab Potential Good    Clinical Decision Making Multiple treatment options, significant modification of task necessary    Comorbidities Affecting  Occupational Performance: Presence of comorbidities impacting occupational performance    Modification or Assistance to Complete Evaluation  Min-Moderate modification of tasks or assist with assess necessary to complete eval    OT Frequency 1x / week    OT Duration 12 weeks    OT Treatment/Interventions Self-care/ADL training;DME and/or AE instruction;Manual lymph drainage;Compression bandaging;Therapeutic activities;Coping strategies training;Therapeutic exercise;Manual Therapy;Patient/family education;Other (comment)   tissue fibrosis massage; skin care to reduce tissue sensativity/ pain and increase hydration to limit infection risk and LE progression   Plan Complete Deconhgestive Therapy (CDT) Intensive and Self -Management phases, consisting of Manual lymphatic Drainage (MLD), skin care, ther ex and compression bandaging    OT Home Exercise Plan lymphatic pumping therex 2-3 x q day, in sequence, 10 reps bilaterally    Recommended Other Services Daytime: 2 Pr. Jobst EVAREX custom, ccl 2, A-D, 2.5 cm SB at oblique top edge, open toe, t heel.  HOS: Jobst RELAX custom ccl 2 A-D, OT w. posterior zipper    Consulted and Agree with Plan of Care Patient           Patient will benefit from skilled therapeutic intervention in order to improve the following deficits and impairments:   Body Structure / Function /  Physical Skills: ADL,Pain,Balance,Edema,IADL,Decreased knowledge of precautions,Skin integrity,Decreased knowledge of use of DME,ROM       Visit Diagnosis: Lymphedema, not elsewhere classified - Plan: Ot plan of care cert/re-cert    Problem List Patient Active Problem List   Diagnosis Date Noted  . Iron deficiency 08/06/2018  . History of total left knee replacement (TKR) 07/18/2018  . Rectal bleeding 07/05/2018  . Hemorrhoids 07/05/2018  . Anal itching 07/05/2018  . Rotator cuff arthropathy of right shoulder   . Shoulder arthritis 06/15/2017  . Status post revision of total knee  replacement, left 04/29/2017  . Polyethylene liner wear following left total knee arthroplasty requiring isolated polyethylene liner exchange ([place]) 03/02/2017  . 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 Unstable angina ([place]) 05/21/2016  . Chronic diastolic heart failure ([place]) 03/29/2016  . Normal coronary arteries 2009 01/14/2015  . Obesity-BMI 40 01/14/2015  . Restless leg 01/14/2015  . Chest pain 01/14/2015  . Back pain 01/14/2015  . Renal insufficiency 01/14/2015  . Dementia- mild memory issues 01/14/2015  . Occult blood positive stool 12/14/2014  . Anemia 12/14/2014  . Family history of colon cancer 12/14/2014  . Change in bowel habits 12/14/2014  . GERD (gastroesophageal reflux disease) 12/14/2014  . Dyspnea 05/17/2012  . Lymphedema 05/17/2012  . Weight gain 05/17/2012  . HTN (hypertension) 05/17/2012    Andrey Spearman, MS, OTR/L, [facility] 11/04/20 9:31 AM   [place] MAIN [facility] SERVICES [address] [place], [place], [address] Phone: [phone]   Fax:  [phone]  Name: [name] MRN: [id number] Date of Birth: 11-14-1945

## 2020-11-11 ENCOUNTER — Other Ambulatory Visit: Payer: Self-pay

## 2020-11-11 ENCOUNTER — Ambulatory Visit: Payer: Medicare Other | Admitting: Occupational Therapy

## 2020-11-11 DIAGNOSIS — I890 Lymphedema, not elsewhere classified: Secondary | ICD-10-CM | POA: Diagnosis not present

## 2020-11-11 NOTE — Addendum Note (Signed)
Addended by: Ansel Bong on: 11/11/2020 08:51 AM   Modules accepted: Orders

## 2020-11-11 NOTE — Therapy (Signed)
[place] MAIN [facility] SERVICES [address] [place], [place], [address] Phone: [phone]   Fax:  [phone]  Occupational Therapy Treatment  Patient Details  Name: [name] MRN: [id number] Date of Birth: [birth year] Referring Provider (OT): Juluis Pitch, MD   Encounter Date: 11/11/2020   OT End of Session - 11/11/20 1617    Visit Number 98    Number of Visits 108    Date for OT Re-Evaluation 02/02/21    OT Start Time 1015    OT Stop Time 1115    OT Time Calculation (min) 60 min    Equipment Utilized During Treatment friction gloves and tyvek slipper    Activity Tolerance Patient tolerated treatment well;No increased pain;Other (comment);Treatment limited secondary to medical complications (Comment)   uncontrolled systemic fluid retention and fluctuation; inconsistent caregiving by inconsistent therapy staff~ many agency contract employees   Behavior During Therapy [facility] for tasks assessed/performed   sleepiness throughout session          Past Medical History:  Diagnosis Date  . Anemia   . Anxiety   . Arthritis    "hands, feet" (03/03/2017)  . Brain lesion    2 types  . Cataract   . Cervical spondylosis with myelopathy   . Chest pain    Normal cardiac cath 5/09  . Chronic lower back pain   . CKD (chronic kidney disease), stage III ([place])    [name]  . Congestive heart failure (CHF) ([place])    has a Cardiomems implant   . Constipation   . Dementia ([place])   . Depression   . Dumping syndrome   . Dyspnea    with activity  . Edema   . Fatigue   . Fibromyalgia   . GERD (gastroesophageal reflux disease)   . Headache    "w/high CBG" (03/03/2017)  . History of echocardiogram    a. Echo 03/20/16 (done at [facility] in [place], [place]):  mild LVH, EF 12%, normal diastolic function, mild LAE, MAC, RVSP 25 mmHg  . Hyperlipidemia   . Hypertension   . Hypothyroidism   . Lumbar spondylosis   . Lymphedema     seeing specialist for this  . Migraine    "mostly stopped when I changed my diet" (03/03/2017)  . OSA on CPAP   . Pneumonia X 1  . PONV (postoperative nausea and vomiting)   . Restless leg syndrome   . Rheumatoid arthritis ([place])   . Stroke [facility])    TIAs "mini strokes"- unsure of last TIA - pt and daughter deny this  . Syncope   . Tremors of nervous system   . Type II diabetes mellitus ([place])     Past Surgical History:  Procedure Laterality Date  . APPENDECTOMY  1966  . BACK SURGERY    . CARDIAC CATHETERIZATION N/A 05/25/2016   Procedure: Right/Left Heart Cath and Coronary Angiography;  Surgeon: Larey Dresser, MD;  Location: [place] CV LAB;  Service: Cardiovascular;  Laterality: N/A;  . CATARACT EXTRACTION W/ INTRAOCULAR LENS  IMPLANT, BILATERAL Bilateral   . COLONOSCOPY    . CORONARY ANGIOGRAM  2009   Normal coronaries  . EXCISION/RELEASE BURSA HIP Bilateral   . I & D KNEE WITH POLY EXCHANGE Left 03/02/2017   Procedure: Left knee Revision of poly liner;  Surgeon: Mcarthur Rossetti, MD;  Location: [facility];  Service: Orthopedics;  Laterality: Left;  . JOINT REPLACEMENT    . KNEE  ARTHROSCOPY Bilateral   . LAPAROSCOPIC CHOLECYSTECTOMY  2015  . LMF  2017   CardiMEMS HF implant for CHF (measures amount of fluid in the heart)  . LUMBAR DISC SURGERY    . LUMBAR LAMINECTOMY/DECOMPRESSION MICRODISCECTOMY Left 12/2005   L2-3 laminectomy and diskectomy/notes 01/06/2011  . POSTERIOR LUMBAR FUSION  04/2006   [name] 01/06/2011; "put cages in"  . REVERSE SHOULDER ARTHROPLASTY Left 06/15/2017   Procedure: REVERSE LEFT SHOULDER ARTHROPLASTY;  Surgeon: Meredith Pel, MD;  Location: [place];  Service: Orthopedics;  Laterality: Left;  . REVERSE SHOULDER ARTHROPLASTY Right 02/01/2018  . REVERSE SHOULDER ARTHROPLASTY Right 02/01/2018   Procedure: RIGHT REVERSE SHOULDER ARTHROPLASTY;  Surgeon: Meredith Pel, MD;  Location: [place];  Service: Orthopedics;  Laterality: Right;  . REVISION  TOTAL KNEE ARTHROPLASTY Left 03/02/2017   poly liner/notes 03/02/2017  . RIGHT HEART CATH N/A 11/13/2016   Procedure: Right Heart Cath with Cardiomems;  Surgeon: Larey Dresser, MD;  Location: [place] [facility];  Service: Cardiovascular;  Laterality: N/A;  . SHOULDER ARTHROSCOPY WITH ROTATOR CUFF REPAIR Right   . SHOULDER OPEN ROTATOR CUFF REPAIR Left 01/2011   [name] 01/29/2011  . TOTAL ABDOMINAL HYSTERECTOMY    . TOTAL KNEE ARTHROPLASTY Left   . TOTAL KNEE ARTHROPLASTY Right 11/18/2012   Procedure: TOTAL KNEE ARTHROPLASTY;  Surgeon: Yvette Rack., MD;  Location: [place];  Service: Orthopedics;  Laterality: Right;  . TUBAL LIGATION    . UPPER GASTROINTESTINAL ENDOSCOPY    . UPPER GI ENDOSCOPY      There were no vitals filed for this visit.   Subjective Assessment - 11/11/20 1610    Subjective  Mrs Ireland presents to OT for Rx visit 86/108  to address BLE lipo-lymphedema (I89.0).Pt reports , "My legs were so swollen this weekend I couldn't get my stockings on. I've lost 8 pounds....and we were able to get them on this morning. Pt rings compression wraps to clinic.    Pertinent History chronic BLE leg swelling and associated pain ~ 40 years; hx herniated intervertebral discL TKA; L total knee revision; HTN, Obesity. dementia; restless leg; CHF; unstable anina; R RC arthroplasty, OA, cervical spondylosis with myelopathy, chronic lower back pain, CKD (Stage?); Fibromyalgia; Hypothyroidism; OSA ( cPap?); hx pneumonia; RA, Type II Diabetes. S/P cardiac cathg, B THA, Lumbar laminectomy, posterior lumbar fusion, B shoulder arthroplasty; total abdominal hysterectomy    Limitations chronic leg swelling and associated pain, chronic OA pain, difficulty walking, impaired balance, muscle weakness, decreased knee hip and shoulder AROM, decreased standing tolerance    Repetition Increases Symptoms    Special Tests - STEMMER sign base of toes bilaterally    Patient Stated Goals get my legs up more. do skin care  more often                        OT Treatments/Exercises (OP) - 11/11/20 1611      ADLs   ADL Education Given Yes      Manual Therapy   Manual Therapy Edema management;Manual Lymphatic Drainage (MLD)    Edema Management skin care to LLE throughout MLD with low ph castor oil to limit infection risk    Manual Lymphatic Drainage (MLD) LLE MLD utilizing short neck sequence, deep abdominal pathways, functional inguinal LN, and proximal to distal J strokes from groin to ankles.    Compression Bandaging LLE multilayer, short stretch gradient compression wraps as established.  OT Education - 11/11/20 1612    Education Details Continued skilled Pt/caregiver education  And LE ADL training throughout visit for lymphedema self care/ home program, including compression wrapping, compression garment and device wear/care, lymphatic pumping ther ex, simple self-MLD, and skin care. Discussed progress towards goals and OT POC going forward.    Person(s) Educated Patient    Methods Explanation;Demonstration    Comprehension Verbalized understanding;Returned demonstration               OT Long Term Goals - 09/30/20 0821      OT LONG TERM GOAL #1   Title Pt will be able to apply LLE multi-layer, short stretch compression wraps daily with maximum caregiver assistance, using correct gradient techniques independently to return affected limb/s, as closely as possible, to premorbid size and shape, to limit infection risk, and to improve safe functional mobility and ADLs performance.    Baseline dependent    Time 4    Period Days    Status Achieved   DC goal. Pt not currently wrapping. There is no one available at SNF to apply multilayer wraps at present.     OT LONG TERM GOAL #2   Title Pt will be able to verbalize signs and symptoms of cellulitis infection and identify 4 common lymphedema precautions using printed resource for reference (modified independence) to  limit LE progression over time.    Baseline Max A    Time 4    Period Days    Status Achieved      OT LONG TERM GOAL #3   Title Pt will sustain 85%, or greater compliance with all daily LE self-care home program components throughout Intensive Phase CDT, including impeccable skin care, lymphatic pumping therex,  compression wraps;/ garments and devices and simple self MLD, or Flexitouch device daily to ensure optimal limb volume reduction, to limit infection risk and to limit LE progression.   Pt reports 90% compliance. She is dependent on staff at her SNF for assistance, and this has been challenging with staff levels low and travelers often covering vacant positions.   Baseline dependent    Time 12    Period Weeks    Status Partially Met   Pt reports she does not perform skin care daily as directed, but does ~ 4 x weekly. This is an area she will work on going forward.   Target Date 11/03/20      OT LONG TERM GOAL #4   Title Pt to achieve at least 10% LLE limb volume reduction during Intensive Phase CDT with Max CG assistance for wrapping, to improve functional performance of basic and instrumental ADLs, and to limit LE progression.   to date LLE reductions: A-D= 15.7%, E-G= 29.63%, A-G= 22.9%. RLE: A-D: 6.7%, E-G= 15.7%, A-G= 11.2%   Baseline dependent    Time 12    Period Weeks    Status Partially Met   Limb volume continues to fluctuate, somcetimes dramatically. Continue regular OT for CDT in effort to stabilize  within goal range, for at least 6 consecutive months, to limit infection risk, LE progression and further functional decline   Target Date 11/03/20      OT LONG TERM GOAL #5   Title During self-management phase of CDT Pt will retain limb volume reductions achieved during Intensive Phase CDT with no more than 3% volume increase using all LE self care components  to limit LE progression and further functional decline.    Baseline dependent  Time 6    Period Months    Status  On-going   see comment above. Not yet met, but ongoing   Target Date 11/03/20      Long Term Additional Goals   Additional Long Term Goals Yes      OT LONG TERM GOAL #6   Title Pt will increase compliance w/ LE self care home program by increasing skin care frequency from 4 x to 5 time daily each week to limit LE progression and reduce infection risk.    Baseline Mod A    Time 12    Period Weeks    Status New    Target Date 12/29/20      OT LONG TERM GOAL #7   Title Pt will increase leg elevation during regular seated routines by at least 2 hours per day to limit LE progression and further functional decline.    Baseline Mod A    Time 12    Period Weeks    Status New    Target Date 12/29/20                 Plan - 11/11/20 1613    Clinical Impression Statement BLE swelling remains elevated this morning.  She tolerated LLE MLD, skin care and gradient compression wrapping to LLE only without increased pain. . Cont 1 x weekly as per POC to limit progression and further functional decline. Cont  compression wraps to one lower extremity below the knee   after each session going forward in an effort to regain improved control of intractable , fluctuation limb swelling.    OT Occupational Profile and History Comprehensive Assessment- Review of records and extensive additional review of physical, cognitive, psychosocial history related to current functional performance    Occupational performance deficits (Please refer to evaluation for details): ADL's;IADL's;Rest and Sleep;Leisure;Social Participation;Other   body image   Body Structure / Function / Physical Skills ADL;Pain;Balance;Edema;IADL;Decreased knowledge of precautions;Skin integrity;Decreased knowledge of use of DME;ROM    Rehab Potential Good    Clinical Decision Making Multiple treatment options, significant modification of task necessary    Comorbidities Affecting Occupational Performance: Presence of comorbidities impacting  occupational performance    Modification or Assistance to Complete Evaluation  Min-Moderate modification of tasks or assist with assess necessary to complete eval    OT Frequency 1x / week    OT Duration 12 weeks    OT Treatment/Interventions Self-care/ADL training;DME and/or AE instruction;Manual lymph drainage;Compression bandaging;Therapeutic activities;Coping strategies training;Therapeutic exercise;Manual Therapy;Patient/family education;Other (comment)   tissue fibrosis massage; skin care to reduce tissue sensativity/ pain and increase hydration to limit infection risk and LE progression   Plan Complete Deconhgestive Therapy (CDT) Intensive and Self -Management phases, consisting of Manual lymphatic Drainage (MLD), skin care, ther ex and compression bandaging    OT Home Exercise Plan lymphatic pumping therex 2-3 x q day, in sequence, 10 reps bilaterally    Recommended Other Services Daytime: 2 Pr. [employer] EVAREX custom, ccl 2, A-D, 2.5 cm SB at oblique top edge, open toe, t heel.  HOS: [employer]st RELAX custom ccl 2 A-D, OT w. posterior zipper    Consulted and Agree with Plan of Care Patient           Patient will benefit from skilled therapeutic intervention in order to improve the following deficits and impairments:   Body Structure / Function / Physical Skills: ADL,Pain,Balance,Edema,IADL,Decreased knowledge of precautions,Skin integrity,Decreased knowledge of use of DME,ROM       Visit Diagnosis:  Lymphedema, not elsewhere classified    Problem List Patient Active Problem List   Diagnosis Date Noted  . Iron deficiency 08/06/2018  . History of total left knee replacement (TKR) 07/18/2018  . Rectal bleeding 07/05/2018  . Hemorrhoids 07/05/2018  . Anal itching 07/05/2018  . Rotator cuff arthropathy of right shoulder   . Shoulder arthritis 06/15/2017  . Status post revision of total knee replacement, left 04/29/2017  . Polyethylene liner wear following left total knee arthroplasty  requiring isolated polyethylene liner exchange ([place]) 03/02/2017  . 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 Unstable angina ([place]) 05/21/2016  . Chronic diastolic heart failure ([place]) 03/29/2016  . Normal coronary arteries 2009 01/14/2015  . Obesity-BMI 40 01/14/2015  . Restless leg 01/14/2015  . Chest pain 01/14/2015  . Back pain 01/14/2015  . Renal insufficiency 01/14/2015  . Dementia- mild memory issues 01/14/2015  . Occult blood positive stool 12/14/2014  . Anemia 12/14/2014  . Family history of colon cancer 12/14/2014  . Change in bowel habits 12/14/2014  . GERD (gastroesophageal reflux disease) 12/14/2014  . Dyspnea 05/17/2012  . Lymphedema 05/17/2012  . Weight gain 05/17/2012  . HTN (hypertension) 05/17/2012    Andrey Spearman, MS, OTR/L, [facility] 11/11/20 4:20 PM  [place] MAIN [facility] SERVICES [address] [place], [place], [address] Phone: [phone]   Fax:  [phone]  Name: KATHERN LOBOSCO MRN: [id number] Date of Birth: Jul 23, 1946

## 2020-11-18 ENCOUNTER — Ambulatory Visit: Payer: Medicare Other | Admitting: Occupational Therapy

## 2020-11-19 ENCOUNTER — Ambulatory Visit: Payer: Medicare Other | Admitting: Occupational Therapy

## 2020-11-19 ENCOUNTER — Other Ambulatory Visit: Payer: Self-pay

## 2020-11-19 DIAGNOSIS — I890 Lymphedema, not elsewhere classified: Secondary | ICD-10-CM | POA: Diagnosis not present

## 2020-11-19 NOTE — Therapy (Signed)
[place] MAIN [facility] SERVICES [address] [place], [place], [address] Phone: [phone]   Fax:  [phone]  Occupational Therapy Treatment  Patient Details  Name: [name] MRN: [id number] Date of Birth: 05/30/1946 Referring Provider (OT): Juluis Pitch, MD   Encounter Date: 11/19/2020   OT End of Session - 11/19/20 1139    Visit Number 34    Number of Visits 108    Date for OT Re-Evaluation 02/02/21    OT Start Time 1013    OT Stop Time 1116    OT Time Calculation (min) 63 min    Equipment Utilized During Treatment friction gloves and tyvek slipper    Activity Tolerance Patient tolerated treatment well;No increased pain;Other (comment);Treatment limited secondary to medical complications (Comment)   uncontrolled systemic fluid retention and fluctuation; inconsistent caregiving by inconsistent therapy staff~ many agency contract employees   Behavior During Therapy [facility] for tasks assessed/performed   sleepiness throughout session          Past Medical History:  Diagnosis Date  . Anemia   . Anxiety   . Arthritis    "hands, feet" (03/03/2017)  . Brain lesion    2 types  . Cataract   . Cervical spondylosis with myelopathy   . Chest pain    Normal cardiac cath 5/09  . Chronic lower back pain   . CKD (chronic kidney disease), stage III ([place])    [name]  . Congestive heart failure (CHF) ([place])    has a Cardiomems implant   . Constipation   . Dementia ([place])   . Depression   . Dumping syndrome   . Dyspnea    with activity  . Edema   . Fatigue   . Fibromyalgia   . GERD (gastroesophageal reflux disease)   . Headache    "w/high CBG" (03/03/2017)  . History of echocardiogram    a. Echo 03/20/16 (done at [facility] in [place], [place]):  mild LVH, EF 59%, normal diastolic function, mild LAE, MAC, RVSP 25 mmHg  . Hyperlipidemia   . Hypertension   . Hypothyroidism   . Lumbar spondylosis   . Lymphedema     seeing specialist for this  . Migraine    "mostly stopped when I changed my diet" (03/03/2017)  . OSA on CPAP   . Pneumonia X 1  . PONV (postoperative nausea and vomiting)   . Restless leg syndrome   . Rheumatoid arthritis ([place])   . Stroke [facility])    TIAs "mini strokes"- unsure of last TIA - pt and daughter deny this  . Syncope   . Tremors of nervous system   . Type II diabetes mellitus ([place])     Past Surgical History:  Procedure Laterality Date  . APPENDECTOMY  1966  . BACK SURGERY    . CARDIAC CATHETERIZATION N/A 05/25/2016   Procedure: Right/Left Heart Cath and Coronary Angiography;  Surgeon: Larey Dresser, MD;  Location: [place] [facility];  Service: Cardiovascular;  Laterality: N/A;  . CATARACT EXTRACTION W/ INTRAOCULAR LENS  IMPLANT, BILATERAL Bilateral   . COLONOSCOPY    . CORONARY ANGIOGRAM  2009   Normal coronaries  . EXCISION/RELEASE BURSA HIP Bilateral   . I & D KNEE WITH POLY EXCHANGE Left 03/02/2017   Procedure: Left knee Revision of poly liner;  Surgeon: Mcarthur Rossetti, MD;  Location: [place];  Service: Orthopedics;  Laterality: Left;  . JOINT REPLACEMENT    . KNEE  ARTHROSCOPY Bilateral   . LAPAROSCOPIC CHOLECYSTECTOMY  2015  . LMF  2017   CardiMEMS HF implant for CHF (measures amount of fluid in the heart)  . LUMBAR DISC SURGERY    . LUMBAR LAMINECTOMY/DECOMPRESSION MICRODISCECTOMY Left 12/2005   L2-3 laminectomy and diskectomy/notes 01/06/2011  . POSTERIOR LUMBAR FUSION  04/2006   [name] 01/06/2011; "put cages in"  . REVERSE SHOULDER ARTHROPLASTY Left 06/15/2017   Procedure: REVERSE LEFT SHOULDER ARTHROPLASTY;  Surgeon: Meredith Pel, MD;  Location: [place];  Service: Orthopedics;  Laterality: Left;  . REVERSE SHOULDER ARTHROPLASTY Right 02/01/2018  . REVERSE SHOULDER ARTHROPLASTY Right 02/01/2018   Procedure: RIGHT REVERSE SHOULDER ARTHROPLASTY;  Surgeon: Meredith Pel, MD;  Location: [place];  Service: Orthopedics;  Laterality: Right;  . REVISION  TOTAL KNEE ARTHROPLASTY Left 03/02/2017   poly liner/notes 03/02/2017  . RIGHT HEART CATH N/A 11/13/2016   Procedure: Right Heart Cath with Cardiomems;  Surgeon: Larey Dresser, MD;  Location: [place] [facility];  Service: Cardiovascular;  Laterality: N/A;  . SHOULDER ARTHROSCOPY WITH ROTATOR CUFF REPAIR Right   . SHOULDER OPEN ROTATOR CUFF REPAIR Left 01/2011   [name] 01/29/2011  . TOTAL ABDOMINAL HYSTERECTOMY    . TOTAL KNEE ARTHROPLASTY Left   . TOTAL KNEE ARTHROPLASTY Right 11/18/2012   Procedure: TOTAL KNEE ARTHROPLASTY;  Surgeon: Yvette Rack., MD;  Location: [place];  Service: Orthopedics;  Laterality: Right;  . TUBAL LIGATION    . UPPER GASTROINTESTINAL ENDOSCOPY    . UPPER GI ENDOSCOPY      There were no vitals filed for this visit.   Subjective Assessment - 11/19/20 1139    Subjective  [name] presents to OT for Rx visit 87/108  to address BLE lipo-lymphedema (I89.0).Pt reports , Pt has had to reschedule last to visits, including today, due to mix up re transportation schedule. Pt presents with compression garments in place bilaterally. She reports that no one assists her with donning/ doffing Flexitouch garments  and set up when her most attentifve NA is off, which is every other weekend. Pt requests I address this issue in my note to remind staff that the Flexitouch is to be utilized daily for optimal LE management. I did restate the recommendations as requested.    Pertinent History chronic BLE leg swelling and associated pain ~ 40 years; hx herniated intervertebral discL TKA; L total knee revision; HTN, Obesity. dementia; restless leg; CHF; unstable anina; R RC arthroplasty, OA, cervical spondylosis with myelopathy, chronic lower back pain, CKD (Stage?); Fibromyalgia; Hypothyroidism; OSA ( cPap?); hx pneumonia; RA, Type II Diabetes. S/P cardiac cathg, B THA, Lumbar laminectomy, posterior lumbar fusion, B shoulder arthroplasty; total abdominal hysterectomy    Limitations chronic leg  swelling and associated pain, chronic OA pain, difficulty walking, impaired balance, muscle weakness, decreased knee hip and shoulder AROM, decreased standing tolerance    Repetition Increases Symptoms    Special Tests - STEMMER sign base of toes bilaterally    Patient Stated Goals get my legs up more. do skin care more often                        OT Treatments/Exercises (OP) - 11/19/20 1144      ADLs   ADL Education Given Yes      Manual Therapy   Manual Therapy Edema management;Manual Lymphatic Drainage (MLD)    Edema Management skin care to RLE throughout MLD with low ph castor oil to limit infection  risk    Manual Lymphatic Drainage (MLD) RLE MLD utilizing short neck sequence, deep abdominal pathways, functional inguinal LN, and proximal to distal J strokes from groin to ankles.    Compression Bandaging RLE multilayer, short stretch gradient compression wraps as established.                  OT Education - 11/19/20 1145    Education Details Continued skilled Pt/caregiver education  And LE ADL training throughout visit for lymphedema self care/ home program, including compression wrapping, compression garment and device wear/care, lymphatic pumping ther ex, simple self-MLD, and skin care. Discussed progress towards goals and OT POC going forward.    Person(s) Educated Patient    Methods Explanation;Demonstration    Comprehension Verbalized understanding;Returned demonstration               OT Long Term Goals - 09/30/20 0821      OT LONG TERM GOAL #1   Title Pt will be able to apply LLE multi-layer, short stretch compression wraps daily with maximum caregiver assistance, using correct gradient techniques independently to return affected limb/s, as closely as possible, to premorbid size and shape, to limit infection risk, and to improve safe functional mobility and ADLs performance.    Baseline dependent    Time 4    Period Days    Status Achieved   DC  goal. Pt not currently wrapping. There is no one available at SNF to apply multilayer wraps at present.     OT LONG TERM GOAL #2   Title Pt will be able to verbalize signs and symptoms of cellulitis infection and identify 4 common lymphedema precautions using printed resource for reference (modified independence) to limit LE progression over time.    Baseline Max A    Time 4    Period Days    Status Achieved      OT LONG TERM GOAL #3   Title Pt will sustain 85%, or greater compliance with all daily LE self-care home program components throughout Intensive Phase CDT, including impeccable skin care, lymphatic pumping therex,  compression wraps;/ garments and devices and simple self MLD, or Flexitouch device daily to ensure optimal limb volume reduction, to limit infection risk and to limit LE progression.   Pt reports 90% compliance. She is dependent on staff at her SNF for assistance, and this has been challenging with staff levels low and travelers often covering vacant positions.   Baseline dependent    Time 12    Period Weeks    Status Partially Met   Pt reports she does not perform skin care daily as directed, but does ~ 4 x weekly. This is an area she will work on going forward.   Target Date 11/03/20      OT LONG TERM GOAL #4   Title Pt to achieve at least 10% LLE limb volume reduction during Intensive Phase CDT with Max CG assistance for wrapping, to improve functional performance of basic and instrumental ADLs, and to limit LE progression.   to date LLE reductions: A-D= 15.7%, E-G= 29.63%, A-G= 22.9%. RLE: A-D: 6.7%, E-G= 15.7%, A-G= 11.2%   Baseline dependent    Time 12    Period Weeks    Status Partially Met   Limb volume continues to fluctuate, somcetimes dramatically. Continue regular OT for CDT in effort to stabilize  within goal range, for at least 6 consecutive months, to limit infection risk, LE progression and further functional decline   Target  Date 11/03/20      OT LONG  TERM GOAL #5   Title During self-management phase of CDT Pt will retain limb volume reductions achieved during Intensive Phase CDT with no more than 3% volume increase using all LE self care components  to limit LE progression and further functional decline.    Baseline dependent    Time 6    Period Months    Status On-going   see comment above. Not yet met, but ongoing   Target Date 11/03/20      Long Term Additional Goals   Additional Long Term Goals Yes      OT LONG TERM GOAL #6   Title Pt will increase compliance w/ LE self care home program by increasing skin care frequency from 4 x to 5 time daily each week to limit LE progression and reduce infection risk.    Baseline Mod A    Time 12    Period Weeks    Status New    Target Date 12/29/20      OT LONG TERM GOAL #7   Title Pt will increase leg elevation during regular seated routines by at least 2 hours per day to limit LE progression and further functional decline.    Baseline Mod A    Time 12    Period Weeks    Status New    Target Date 12/29/20                 Plan - 11/19/20 1147    Clinical Impression Statement Symetrical BLE swelling continues to fluctuate rather dramatically from week to week. Legs are less swollen today compared with last several visits, despite report of intermittent use of sequential "pump" instead of recommended daily frequency due to limited assistace at SNF facility. Skin remains in good condition without signs/ symptoms of infection. Existing compression garments are worn and no longer fit correctly due to increased leg swelling. Pt agrees with plan to remeasure for replacements after her move to new facility is complet. Pt tolerated MLD, skin care and compression wraps to RLE today without increased pain and / or SOB at rest. Cont weekly skilled OT for CDT to limit LE progression and infection risk. Continue to advocate for consistent caregiver support, and assist with transition to new SNF re  LE self-care with assistance.    OT Occupational Profile and History Comprehensive Assessment- Review of records and extensive additional review of physical, cognitive, psychosocial history related to current functional performance    Occupational performance deficits (Please refer to evaluation for details): ADL's;IADL's;Rest and Sleep;Leisure;Social Participation;Other   body image   Body Structure / Function / Physical Skills ADL;Pain;Balance;Edema;IADL;Decreased knowledge of precautions;Skin integrity;Decreased knowledge of use of DME;ROM    Rehab Potential Good    Clinical Decision Making Multiple treatment options, significant modification of task necessary    Comorbidities Affecting Occupational Performance: Presence of comorbidities impacting occupational performance    Modification or Assistance to Complete Evaluation  Min-Moderate modification of tasks or assist with assess necessary to complete eval    OT Frequency 1x / week    OT Duration 12 weeks    OT Treatment/Interventions Self-care/ADL training;DME and/or AE instruction;Manual lymph drainage;Compression bandaging;Therapeutic activities;Coping strategies training;Therapeutic exercise;Manual Therapy;Patient/family education;Other (comment)   tissue fibrosis massage; skin care to reduce tissue sensativity/ pain and increase hydration to limit infection risk and LE progression   Plan Complete Deconhgestive Therapy (CDT) Intensive and Self -Management phases, consisting of Manual lymphatic Drainage (MLD), skin  care, ther ex and compression bandaging    OT Home Exercise Plan lymphatic pumping therex 2-3 x q day, in sequence, 10 reps bilaterally    Recommended Other Services Daytime: 2 Pr. [employer]st EVAREX custom, ccl 2, A-D, 2.5 cm SB at oblique top edge, open toe, t heel.  HOS: Jobst RELAX custom ccl 2 A-D, OT w. posterior zipper    Consulted and Agree with Plan of Care Patient           Patient will benefit from skilled therapeutic  intervention in order to improve the following deficits and impairments:   Body Structure / Function / Physical Skills: ADL,Pain,Balance,Edema,IADL,Decreased knowledge of precautions,Skin integrity,Decreased knowledge of use of DME,ROM       Visit Diagnosis: Lymphedema, not elsewhere classified    Problem List Patient Active Problem List   Diagnosis Date Noted  . Iron deficiency 08/06/2018  . History of total left knee replacement (TKR) 07/18/2018  . Rectal bleeding 07/05/2018  . Hemorrhoids 07/05/2018  . Anal itching 07/05/2018  . Rotator cuff arthropathy of right shoulder   . Shoulder arthritis 06/15/2017  . Status post revision of total knee replacement, left 04/29/2017  . Polyethylene liner wear following left total knee arthroplasty requiring isolated polyethylene liner exchange ([place]) 03/02/2017  . 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 Unstable angina ([place]) 05/21/2016  . Chronic diastolic heart failure ([place]) 03/29/2016  . Normal coronary arteries 2009 01/14/2015  . Obesity-BMI 40 01/14/2015  . Restless leg 01/14/2015  . Chest pain 01/14/2015  . Back pain 01/14/2015  . Renal insufficiency 01/14/2015  . Dementia- mild memory issues 01/14/2015  . Occult blood positive stool 12/14/2014  . Anemia 12/14/2014  . Family history of colon cancer 12/14/2014  . Change in bowel habits 12/14/2014  . GERD (gastroesophageal reflux disease) 12/14/2014  . Dyspnea 05/17/2012  . Lymphedema 05/17/2012  . Weight gain 05/17/2012  . HTN (hypertension) 05/17/2012   Andrey Spearman, MS, OTR/L, [facility] 11/19/20 12:09 PM   [place] MAIN [facility] SERVICES [address] [place], [place], [address] Phone: [phone]   Fax:  [phone]  Name: [name] MRN: [id number] Date of Birth: [birth year]

## 2020-11-25 ENCOUNTER — Ambulatory Visit: Payer: Medicare Other | Attending: Nephrology | Admitting: Occupational Therapy

## 2020-11-25 ENCOUNTER — Other Ambulatory Visit: Payer: Self-pay

## 2020-11-25 DIAGNOSIS — I890 Lymphedema, not elsewhere classified: Secondary | ICD-10-CM | POA: Diagnosis present

## 2020-11-25 NOTE — Therapy (Signed)
[place] MAIN [facility] SERVICES [address] [place], [place], [address] Phone: [phone]   Fax:  [phone]  Occupational Therapy Treatment  Patient Details  Name: [name] MRN: [id number] Date of Birth: 1946/07/03 Referring Provider (OT): Juluis Pitch, MD   Encounter Date: 11/25/2020   OT End of Session - 11/25/20 0811    Visit Number 21    Number of Visits 108    Date for OT Re-Evaluation 02/02/21    OT Start Time 0802    OT Stop Time 0907    OT Time Calculation (min) 65 min    Equipment Utilized During Treatment friction gloves and tyvek slipper    Activity Tolerance Patient tolerated treatment well;No increased pain;Other (comment);Treatment limited secondary to medical complications (Comment)   uncontrolled systemic fluid retention and fluctuation; inconsistent caregiving by inconsistent therapy staff~ many agency contract employees   Behavior During Therapy [facility] [place] for tasks assessed/performed   sleepiness throughout session          Past Medical History:  Diagnosis Date  . Anemia   . Anxiety   . Arthritis    "hands, feet" (03/03/2017)  . Brain lesion    2 types  . Cataract   . Cervical spondylosis with myelopathy   . Chest pain    Normal cardiac cath 5/09  . Chronic lower back pain   . CKD (chronic kidney disease), stage III ([place])    [name]  . Congestive heart failure (CHF) ([place])    has a Cardiomems implant   . Constipation   . Dementia ([place])   . Depression   . Dumping syndrome   . Dyspnea    with activity  . Edema   . Fatigue   . Fibromyalgia   . GERD (gastroesophageal reflux disease)   . Headache    "w/high CBG" (03/03/2017)  . History of echocardiogram    a. Echo 03/20/16 (done at [facility] in [place], [place]):  mild LVH, EF 75%, normal diastolic function, mild LAE, MAC, RVSP 25 mmHg  . Hyperlipidemia   . Hypertension   . Hypothyroidism   . Lumbar spondylosis   . Lymphedema     seeing specialist for this  . Migraine    "mostly stopped when I changed my diet" (03/03/2017)  . OSA on CPAP   . Pneumonia X 1  . PONV (postoperative nausea and vomiting)   . Restless leg syndrome   . Rheumatoid arthritis ([place])   . Stroke [facility])    TIAs "mini strokes"- unsure of last TIA - pt and daughter deny this  . Syncope   . Tremors of nervous system   . Type II diabetes mellitus ([place])     Past Surgical History:  Procedure Laterality Date  . APPENDECTOMY  1966  . BACK SURGERY    . CARDIAC CATHETERIZATION N/A 05/25/2016   Procedure: Right/Left Heart Cath and Coronary Angiography;  Surgeon: Larey Dresser, MD;  Location: [place] CV LAB;  Service: Cardiovascular;  Laterality: N/A;  . CATARACT EXTRACTION W/ INTRAOCULAR LENS  IMPLANT, BILATERAL Bilateral   . COLONOSCOPY    . CORONARY ANGIOGRAM  2009   Normal coronaries  . EXCISION/RELEASE BURSA HIP Bilateral   . I & D KNEE WITH POLY EXCHANGE Left 03/02/2017   Procedure: Left knee Revision of poly liner;  Surgeon: Mcarthur Rossetti, MD;  Location: [place];  Service: Orthopedics;  Laterality: Left;  . JOINT REPLACEMENT    . KNEE  ARTHROSCOPY Bilateral   . LAPAROSCOPIC CHOLECYSTECTOMY  2015  . LMF  2017   CardiMEMS HF implant for CHF (measures amount of fluid in the heart)  . LUMBAR DISC SURGERY    . LUMBAR LAMINECTOMY/DECOMPRESSION MICRODISCECTOMY Left 12/2005   L2-3 laminectomy and diskectomy/notes 01/06/2011  . POSTERIOR LUMBAR FUSION  04/2006   [name] 01/06/2011; "put cages in"  . REVERSE SHOULDER ARTHROPLASTY Left 06/15/2017   Procedure: REVERSE LEFT SHOULDER ARTHROPLASTY;  Surgeon: Meredith Pel, MD;  Location: [place];  Service: Orthopedics;  Laterality: Left;  . REVERSE SHOULDER ARTHROPLASTY Right 02/01/2018  . REVERSE SHOULDER ARTHROPLASTY Right 02/01/2018   Procedure: RIGHT REVERSE SHOULDER ARTHROPLASTY;  Surgeon: Meredith Pel, MD;  Location: [place];  Service: Orthopedics;  Laterality: Right;  . REVISION  TOTAL KNEE ARTHROPLASTY Left 03/02/2017   poly liner/notes 03/02/2017  . RIGHT HEART CATH N/A 11/13/2016   Procedure: Right Heart Cath with Cardiomems;  Surgeon: Larey Dresser, MD;  Location: [place] CV LAB;  Service: Cardiovascular;  Laterality: N/A;  . SHOULDER ARTHROSCOPY WITH ROTATOR CUFF REPAIR Right   . SHOULDER OPEN ROTATOR CUFF REPAIR Left 01/2011   [name] 01/29/2011  . TOTAL ABDOMINAL HYSTERECTOMY    . TOTAL KNEE ARTHROPLASTY Left   . TOTAL KNEE ARTHROPLASTY Right 11/18/2012   Procedure: TOTAL KNEE ARTHROPLASTY;  Surgeon: Yvette Rack., MD;  Location: [place];  Service: Orthopedics;  Laterality: Right;  . TUBAL LIGATION    . UPPER GASTROINTESTINAL ENDOSCOPY    . UPPER GI ENDOSCOPY      There were no vitals filed for this visit.   Subjective Assessment - 11/25/20 5003    Subjective  Mrs Wisser presents to OT for Rx visit 88/108  to address BLE lipo-lymphedema (I89.0).Pt reports ,  Pt presents without compression garments in place  this morning. Pt tels me she was rushed and also forgot to bring tem with her for donning after OT. Pt reports leg soreness. She does not rate LE related pain numerically.    Pertinent History chronic BLE leg swelling and associated pain ~ 40 years; hx herniated intervertebral discL TKA; L total knee revision; HTN, Obesity. dementia; restless leg; CHF; unstable anina; R RC arthroplasty, OA, cervical spondylosis with myelopathy, chronic lower back pain, CKD (Stage?); Fibromyalgia; Hypothyroidism; OSA ( cPap?); hx pneumonia; RA, Type II Diabetes. S/P cardiac cathg, B THA, Lumbar laminectomy, posterior lumbar fusion, B shoulder arthroplasty; total abdominal hysterectomy    Limitations chronic leg swelling and associated pain, chronic OA pain, difficulty walking, impaired balance, muscle weakness, decreased knee hip and shoulder AROM, decreased standing tolerance    Repetition Increases Symptoms    Special Tests - STEMMER sign base of toes bilaterally    Patient  Stated Goals get my legs up more. do skin care more often                        OT Treatments/Exercises (OP) - 11/25/20 1622      ADLs   ADL Education Given Yes      Manual Therapy   Manual Therapy Edema management;Manual Lymphatic Drainage (MLD)    Edema Management skin care to LLE throughout MLD with low ph castor oil to limit infection risk    Manual Lymphatic Drainage (MLD) LLE MLD utilizing short neck sequence, deep abdominal pathways, functional inguinal LN, and proximal to distal J strokes from groin to ankles.    Compression Bandaging Pt will get assistance after session a SNF  to don stockings. due to unavailability                  OT Education - 11/25/20 1624    Education Details Continued skilled Pt/caregiver education  And LE ADL training throughout visit for lymphedema self care/ home program, including compression wrapping, compression garment and device wear/care, lymphatic pumping ther ex, simple self-MLD, and skin care. Discussed progress towards goals and OT POC going forward.    Person(s) Educated Patient    Methods Explanation;Demonstration    Comprehension Verbalized understanding;Returned demonstration               OT Long Term Goals - 09/30/20 0821      OT LONG TERM GOAL #1   Title Pt will be able to apply LLE multi-layer, short stretch compression wraps daily with maximum caregiver assistance, using correct gradient techniques independently to return affected limb/s, as closely as possible, to premorbid size and shape, to limit infection risk, and to improve safe functional mobility and ADLs performance.    Baseline dependent    Time 4    Period Days    Status Achieved   DC goal. Pt not currently wrapping. There is no one available at SNF to apply multilayer wraps at present.     OT LONG TERM GOAL #2   Title Pt will be able to verbalize signs and symptoms of cellulitis infection and identify 4 common lymphedema precautions using  printed resource for reference (modified independence) to limit LE progression over time.    Baseline Max A    Time 4    Period Days    Status Achieved      OT LONG TERM GOAL #3   Title Pt will sustain 85%, or greater compliance with all daily LE self-care home program components throughout Intensive Phase CDT, including impeccable skin care, lymphatic pumping therex,  compression wraps;/ garments and devices and simple self MLD, or Flexitouch device daily to ensure optimal limb volume reduction, to limit infection risk and to limit LE progression.   Pt reports 90% compliance. She is dependent on staff at her SNF for assistance, and this has been challenging with staff levels low and travelers often covering vacant positions.   Baseline dependent    Time 12    Period Weeks    Status Partially Met   Pt reports she does not perform skin care daily as directed, but does ~ 4 x weekly. This is an area she will work on going forward.   Target Date 11/03/20      OT LONG TERM GOAL #4   Title Pt to achieve at least 10% LLE limb volume reduction during Intensive Phase CDT with Max CG assistance for wrapping, to improve functional performance of basic and instrumental ADLs, and to limit LE progression.   to date LLE reductions: A-D= 15.7%, E-G= 29.63%, A-G= 22.9%. RLE: A-D: 6.7%, E-G= 15.7%, A-G= 11.2%   Baseline dependent    Time 12    Period Weeks    Status Partially Met   Limb volume continues to fluctuate, somcetimes dramatically. Continue regular OT for CDT in effort to stabilize  within goal range, for at least 6 consecutive months, to limit infection risk, LE progression and further functional decline   Target Date 11/03/20      OT LONG TERM GOAL #5   Title During self-management phase of CDT Pt will retain limb volume reductions achieved during Intensive Phase CDT with no more than 3% volume increase  using all LE self care components  to limit LE progression and further functional decline.     Baseline dependent    Time 6    Period Months    Status On-going   see comment above. Not yet met, but ongoing   Target Date 11/03/20      Long Term Additional Goals   Additional Long Term Goals Yes      OT LONG TERM GOAL #6   Title Pt will increase compliance w/ LE self care home program by increasing skin care frequency from 4 x to 5 time daily each week to limit LE progression and reduce infection risk.    Baseline Mod A    Time 12    Period Weeks    Status New    Target Date 12/29/20      OT LONG TERM GOAL #7   Title Pt will increase leg elevation during regular seated routines by at least 2 hours per day to limit LE progression and further functional decline.    Baseline Mod A    Time 12    Period Weeks    Status New    Target Date 12/29/20                 Plan - 11/25/20 1402    Clinical Impression Statement BLE lymphedema mildly decreased today compared w last visit. Limb swelling continues to fluctuate moderately despite ongoing weekly OT for LE care. Assistance with compression garments and Flexitouch device from SNF staff between visits remains somewhat inconsistent by report. Plan to replace worn cmpression stockings as soon as limb volumes become more stable. Cont OT 1 x weekly to limit infection risk and LE progression.    OT Occupational Profile and History Comprehensive Assessment- Review of records and extensive additional review of physical, cognitive, psychosocial history related to current functional performance    Occupational performance deficits (Please refer to evaluation for details): ADL's;IADL's;Rest and Sleep;Leisure;Social Participation;Other   body image   Body Structure / Function / Physical Skills ADL;Pain;Balance;Edema;IADL;Decreased knowledge of precautions;Skin integrity;Decreased knowledge of use of DME;ROM    Rehab Potential Good    Clinical Decision Making Multiple treatment options, significant modification of task necessary     Comorbidities Affecting Occupational Performance: Presence of comorbidities impacting occupational performance    Modification or Assistance to Complete Evaluation  Min-Moderate modification of tasks or assist with assess necessary to complete eval    OT Frequency 1x / week    OT Duration 12 weeks    OT Treatment/Interventions Self-care/ADL training;DME and/or AE instruction;Manual lymph drainage;Compression bandaging;Therapeutic activities;Coping strategies training;Therapeutic exercise;Manual Therapy;Patient/family education;Other (comment)   tissue fibrosis massage; skin care to reduce tissue sensativity/ pain and increase hydration to limit infection risk and LE progression   Plan Complete Deconhgestive Therapy (CDT) Intensive and Self -Management phases, consisting of Manual lymphatic Drainage (MLD), skin care, ther ex and compression bandaging    OT Home Exercise Plan lymphatic pumping therex 2-3 x q day, in sequence, 10 reps bilaterally    Recommended Other Services Daytime: 2 Pr. [employer] EVAREX custom, ccl 2, A-D, 2.5 cm SB at oblique top edge, open toe, t heel.  HOS: [employer] RELAX custom ccl 2 A-D, OT w. posterior zipper    Consulted and Agree with Plan of Care Patient           Patient will benefit from skilled therapeutic intervention in order to improve the following deficits and impairments:   Body Structure / Function / Physical  Skills: ADL,Pain,Balance,Edema,IADL,Decreased knowledge of precautions,Skin integrity,Decreased knowledge of use of DME,ROM       Visit Diagnosis: Lymphedema, not elsewhere classified    Problem List Patient Active Problem List   Diagnosis Date Noted  . Iron deficiency 08/06/2018  . History of total left knee replacement (TKR) 07/18/2018  . Rectal bleeding 07/05/2018  . Hemorrhoids 07/05/2018  . Anal itching 07/05/2018  . Rotator cuff arthropathy of right shoulder   . Shoulder arthritis 06/15/2017  . Status post revision of total knee replacement,  left 04/29/2017  . Polyethylene liner wear following left total knee arthroplasty requiring isolated polyethylene liner exchange ([place]) 03/02/2017  . 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 Unstable angina ([place]) 05/21/2016  . Chronic diastolic heart failure ([place]) 03/29/2016  . Normal coronary arteries 2009 01/14/2015  . Obesity-BMI 40 01/14/2015  . Restless leg 01/14/2015  . Chest pain 01/14/2015  . Back pain 01/14/2015  . Renal insufficiency 01/14/2015  . Dementia- mild memory issues 01/14/2015  . Occult blood positive stool 12/14/2014  . Anemia 12/14/2014  . Family history of colon cancer 12/14/2014  . Change in bowel habits 12/14/2014  . GERD (gastroesophageal reflux disease) 12/14/2014  . Dyspnea 05/17/2012  . Lymphedema 05/17/2012  . Weight gain 05/17/2012  . HTN (hypertension) 05/17/2012    Andrey Spearman, MS, OTR/L, [facility] 11/25/20 4:29 PM  [place] MAIN [facility] SERVICES [address] [place], [place], [address] Phone: [phone]   Fax:  [phone]  Name: HETTY LINHART MRN: [id number] Date of Birth: Jan 03, 1946

## 2020-11-29 ENCOUNTER — Telehealth: Payer: Self-pay | Admitting: Orthopaedic Surgery

## 2020-11-29 DIAGNOSIS — Z96652 Presence of left artificial knee joint: Secondary | ICD-10-CM

## 2020-11-29 NOTE — Telephone Encounter (Signed)
Pt called stating her PT have stopped working with her completely and she states from her understanding Dr.Blackman didn't want them to continue with her knee but she could keep going for everything else. Pt would like to have a new referral written up for PT that doesn't include her knee and would like a CB when this has been sent.   [phone]

## 2020-11-29 NOTE — Telephone Encounter (Signed)
Is there something specific you want them to be working on in PT?

## 2020-11-29 NOTE — Telephone Encounter (Signed)
Maybe they can work on her mobility in general with hip and back strengthening, conditioning, balance and coordination.

## 2020-12-02 ENCOUNTER — Other Ambulatory Visit: Payer: Self-pay

## 2020-12-02 ENCOUNTER — Ambulatory Visit: Payer: Medicare Other | Admitting: Occupational Therapy

## 2020-12-02 DIAGNOSIS — I890 Lymphedema, not elsewhere classified: Secondary | ICD-10-CM | POA: Diagnosis not present

## 2020-12-02 NOTE — Telephone Encounter (Signed)
PT order placed in chart 

## 2020-12-02 NOTE — Therapy (Signed)
[place] MAIN [facility] SERVICES [address] [place], [place], [address] Phone: [phone]   Fax:  [phone]  Occupational Therapy Treatment  Patient Details  Name: [name] MRN: [id number] Date of Birth: [birth year] Referring Provider (OT): Juluis Pitch, MD   Encounter Date: 12/02/2020   OT End of Session - 12/02/20 0817    Visit Number 41    Number of Visits 108    Date for OT Re-Evaluation 02/02/21    OT Start Time 0804    OT Stop Time 0903    OT Time Calculation (min) 59 min    Equipment Utilized During Treatment friction gloves and tyvek slipper    Activity Tolerance Patient tolerated treatment well;No increased pain;Other (comment);Treatment limited secondary to medical complications (Comment)           Past Medical History:  Diagnosis Date  . Anemia   . Anxiety   . Arthritis    "hands, feet" (03/03/2017)  . Brain lesion    2 types  . Cataract   . Cervical spondylosis with myelopathy   . Chest pain    Normal cardiac cath 5/09  . Chronic lower back pain   . CKD (chronic kidney disease), stage III ([place])    [name]  . Congestive heart failure (CHF) ([place])    has a Cardiomems implant   . Constipation   . Dementia ([place])   . Depression   . Dumping syndrome   . Dyspnea    with activity  . Edema   . Fatigue   . Fibromyalgia   . GERD (gastroesophageal reflux disease)   . Headache    "w/high CBG" (03/03/2017)  . History of echocardiogram    a. Echo 03/20/16 (done at [facility] in [place], [place]):  mild LVH, EF 60%, normal diastolic function, mild LAE, MAC, RVSP 25 mmHg  . Hyperlipidemia   . Hypertension   . Hypothyroidism   . Lumbar spondylosis   . Lymphedema    seeing specialist for this  . Migraine    "mostly stopped when I changed my diet" (03/03/2017)  . OSA on CPAP   . Pneumonia X 1  . PONV (postoperative nausea and vomiting)   . Restless leg syndrome   . Rheumatoid arthritis ([place])    . Stroke [facility])    TIAs "mini strokes"- unsure of last TIA - pt and daughter deny this  . Syncope   . Tremors of nervous system   . Type II diabetes mellitus ([place])     Past Surgical History:  Procedure Laterality Date  . APPENDECTOMY  1966  . BACK SURGERY    . CARDIAC CATHETERIZATION N/A 05/25/2016   Procedure: Right/Left Heart Cath and Coronary Angiography;  Surgeon: Larey Dresser, MD;  Location: [place] [facility];  Service: Cardiovascular;  Laterality: N/A;  . CATARACT EXTRACTION W/ INTRAOCULAR LENS  IMPLANT, BILATERAL Bilateral   . COLONOSCOPY    . CORONARY ANGIOGRAM  2009   Normal coronaries  . EXCISION/RELEASE BURSA HIP Bilateral   . I & D KNEE WITH POLY EXCHANGE Left 03/02/2017   Procedure: Left knee Revision of poly liner;  Surgeon: Mcarthur Rossetti, MD;  Location: [place];  Service: Orthopedics;  Laterality: Left;  . JOINT REPLACEMENT    . KNEE ARTHROSCOPY Bilateral   . LAPAROSCOPIC CHOLECYSTECTOMY  2015  . LMF  2017   CardiMEMS HF implant for CHF (measures amount of fluid in the heart)  . LUMBAR  [facility]    . LUMBAR LAMINECTOMY/DECOMPRESSION MICRODISCECTOMY Left 12/2005   L2-3 laminectomy and diskectomy/notes 01/06/2011  . POSTERIOR LUMBAR FUSION  04/2006   [name] 01/06/2011; "put cages in"  . REVERSE SHOULDER ARTHROPLASTY Left 06/15/2017   Procedure: REVERSE LEFT SHOULDER ARTHROPLASTY;  Surgeon: Meredith Pel, MD;  Location: [place];  Service: Orthopedics;  Laterality: Left;  . REVERSE SHOULDER ARTHROPLASTY Right 02/01/2018  . REVERSE SHOULDER ARTHROPLASTY Right 02/01/2018   Procedure: RIGHT REVERSE SHOULDER ARTHROPLASTY;  Surgeon: Meredith Pel, MD;  Location: [place];  Service: Orthopedics;  Laterality: Right;  . REVISION TOTAL KNEE ARTHROPLASTY Left 03/02/2017   poly liner/notes 03/02/2017  . RIGHT HEART CATH N/A 11/13/2016   Procedure: Right Heart Cath with Cardiomems;  Surgeon: Larey Dresser, MD;  Location: [place] [facility];  Service:  Cardiovascular;  Laterality: N/A;  . SHOULDER ARTHROSCOPY WITH ROTATOR CUFF REPAIR Right   . SHOULDER OPEN ROTATOR CUFF REPAIR Left 01/2011   [name] 01/29/2011  . TOTAL ABDOMINAL HYSTERECTOMY    . TOTAL KNEE ARTHROPLASTY Left   . TOTAL KNEE ARTHROPLASTY Right 11/18/2012   Procedure: TOTAL KNEE ARTHROPLASTY;  Surgeon: Yvette Rack., MD;  Location: [place];  Service: Orthopedics;  Laterality: Right;  . TUBAL LIGATION    . UPPER GASTROINTESTINAL ENDOSCOPY    . UPPER GI ENDOSCOPY      There were no vitals filed for this visit.   Subjective Assessment - 12/02/20 0817    Subjective  [name] presents to OT for Rx visit 89/108  to address BLE lipo-lymphedema (I89.0).Pt reports 2/10 pain "around ankles and distal legs, R>L,  Pt presents with compression garments in place  this morning. Pt reports there were new facility "travelers" over the weekend so she was not able to use Flexitouch. "I got my stockings on though."    Pertinent History chronic BLE leg swelling and associated pain ~ 40 years; hx herniated intervertebral discL TKA; L total knee revision; HTN, Obesity. dementia; restless leg; CHF; unstable anina; R RC arthroplasty, OA, cervical spondylosis with myelopathy, chronic lower back pain, CKD (Stage?); Fibromyalgia; Hypothyroidism; OSA ( cPap?); hx pneumonia; RA, Type II Diabetes. S/P cardiac cathg, B THA, Lumbar laminectomy, posterior lumbar fusion, B shoulder arthroplasty; total abdominal hysterectomy    Limitations chronic leg swelling and associated pain, chronic OA pain, difficulty walking, impaired balance, muscle weakness, decreased knee hip and shoulder AROM, decreased standing tolerance    Repetition Increases Symptoms    Special Tests - STEMMER sign base of toes bilaterally    Patient Stated Goals get my legs up more. do skin care more often                        OT Treatments/Exercises (OP) - 12/02/20 1300      ADLs   ADL Education Given Yes      Manual Therapy    Manual Therapy Edema management;Manual Lymphatic Drainage (MLD)    Edema Management skin care to RLE throughout MLD with low ph castor oil to limit infection risk    Manual Lymphatic Drainage (MLD) RLE MLD utilizing short neck sequence, deep abdominal pathways, functional inguinal LN, and proximal to distal J strokes from groin to ankles.    Compression Bandaging Max A to don R stocking p Rx.                  OT Education - 12/02/20 1301    Education Details Continued skilled Pt/caregiver  education  And LE ADL training throughout visit for lymphedema self care/ home program, including compression wrapping, compression garment and device wear/care, lymphatic pumping ther ex, simple self-MLD, and skin care. Discussed progress towards goals and OT POC going forward.    Person(s) Educated Patient    Methods Explanation;Demonstration    Comprehension Verbalized understanding;Returned demonstration;Need further instruction               OT Long Term Goals - 09/30/20 3244      OT LONG TERM GOAL #1   Title Pt will be able to apply LLE multi-layer, short stretch compression wraps daily with maximum caregiver assistance, using correct gradient techniques independently to return affected limb/s, as closely as possible, to premorbid size and shape, to limit infection risk, and to improve safe functional mobility and ADLs performance.    Baseline dependent    Time 4    Period Days    Status Achieved   DC goal. Pt not currently wrapping. There is no one available at SNF to apply multilayer wraps at present.     OT LONG TERM GOAL #2   Title Pt will be able to verbalize signs and symptoms of cellulitis infection and identify 4 common lymphedema precautions using printed resource for reference (modified independence) to limit LE progression over time.    Baseline Max A    Time 4    Period Days    Status Achieved      OT LONG TERM GOAL #3   Title Pt will sustain 85%, or greater  compliance with all daily LE self-care home program components throughout Intensive Phase CDT, including impeccable skin care, lymphatic pumping therex,  compression wraps;/ garments and devices and simple self MLD, or Flexitouch device daily to ensure optimal limb volume reduction, to limit infection risk and to limit LE progression.   Pt reports 90% compliance. She is dependent on staff at her SNF for assistance, and this has been challenging with staff levels low and travelers often covering vacant positions.   Baseline dependent    Time 12    Period Weeks    Status Partially Met   Pt reports she does not perform skin care daily as directed, but does ~ 4 x weekly. This is an area she will work on going forward.   Target Date 11/03/20      OT LONG TERM GOAL #4   Title Pt to achieve at least 10% LLE limb volume reduction during Intensive Phase CDT with Max CG assistance for wrapping, to improve functional performance of basic and instrumental ADLs, and to limit LE progression.   to date LLE reductions: A-D= 15.7%, E-G= 29.63%, A-G= 22.9%. RLE: A-D: 6.7%, E-G= 15.7%, A-G= 11.2%   Baseline dependent    Time 12    Period Weeks    Status Partially Met   Limb volume continues to fluctuate, somcetimes dramatically. Continue regular OT for CDT in effort to stabilize  within goal range, for at least 6 consecutive months, to limit infection risk, LE progression and further functional decline   Target Date 11/03/20      OT LONG TERM GOAL #5   Title During self-management phase of CDT Pt will retain limb volume reductions achieved during Intensive Phase CDT with no more than 3% volume increase using all LE self care components  to limit LE progression and further functional decline.    Baseline dependent    Time 6    Period Months    Status  On-going   see comment above. Not yet met, but ongoing   Target Date 11/03/20      Long Term Additional Goals   Additional Long Term Goals Yes      OT LONG TERM  GOAL #6   Title Pt will increase compliance w/ LE self care home program by increasing skin care frequency from 4 x to 5 time daily each week to limit LE progression and reduce infection risk.    Baseline Mod A    Time 12    Period Weeks    Status New    Target Date 12/29/20      OT LONG TERM GOAL #7   Title Pt will increase leg elevation during regular seated routines by at least 2 hours per day to limit LE progression and further functional decline.    Baseline Mod A    Time 12    Period Weeks    Status New    Target Date 12/29/20                 Plan - 12/02/20 1248    Clinical Impression Statement B leg swelling mildly decreased again today, despite Pt not being able to use flexi this past weekend. Pt reports staff at SNF were all new "agency people" over the weekend and she, "just didn't feel like fighting that battle" again" to get assistance from staff who are not trained to assist her with donning and doffing the device. By reprt Pt is consistently getting assistance with donning and doffing compression garments daily, and she is able to adjust them herself, oce on, to correct placement PRN. .Pt tolerated RLE/ RLQ MLD with concurrent skin care without increased pain. Pt required Max A to don R knee high garment due to time constraints. Cont1 x weekly as per POC.    OT Occupational Profile and History Comprehensive Assessment- Review of records and extensive additional review of physical, cognitive, psychosocial history related to current functional performance    Occupational performance deficits (Please refer to evaluation for details): ADL's;IADL's;Rest and Sleep;Leisure;Social Participation;Other   body image   Body Structure / Function / Physical Skills ADL;Pain;Balance;Edema;IADL;Decreased knowledge of precautions;Skin integrity;Decreased knowledge of use of DME;ROM    Rehab Potential Good    Clinical Decision Making Multiple treatment options, significant modification of  task necessary    Comorbidities Affecting Occupational Performance: Presence of comorbidities impacting occupational performance    Modification or Assistance to Complete Evaluation  Min-Moderate modification of tasks or assist with assess necessary to complete eval    OT Frequency 1x / week    OT Duration 12 weeks    OT Treatment/Interventions Self-care/ADL training;DME and/or AE instruction;Manual lymph drainage;Compression bandaging;Therapeutic activities;Coping strategies training;Therapeutic exercise;Manual Therapy;Patient/family education;Other (comment)   tissue fibrosis massage; skin care to reduce tissue sensativity/ pain and increase hydration to limit infection risk and LE progression   Plan Complete Deconhgestive Therapy (CDT) Intensive and Self -Management phases, consisting of Manual lymphatic Drainage (MLD), skin care, ther ex and compression bandaging    OT Home Exercise Plan lymphatic pumping therex 2-3 x q day, in sequence, 10 reps bilaterally    Recommended Other Services Daytime: 2 Pr. Jobst EVAREX custom, ccl 2, A-D, 2.5 cm SB at oblique top edge, open toe, t heel.  HOS: Jobst RELAX custom ccl 2 A-D, OT w. posterior zipper    Consulted and Agree with Plan of Care Patient           Patient will benefit from  skilled therapeutic intervention in order to improve the following deficits and impairments:   Body Structure / Function / Physical Skills: ADL,Pain,Balance,Edema,IADL,Decreased knowledge of precautions,Skin integrity,Decreased knowledge of use of DME,ROM       Visit Diagnosis: Lymphedema, not elsewhere classified    Problem List Patient Active Problem List   Diagnosis Date Noted  . Iron deficiency 08/06/2018  . History of total left knee replacement (TKR) 07/18/2018  . Rectal bleeding 07/05/2018  . Hemorrhoids 07/05/2018  . Anal itching 07/05/2018  . Rotator cuff arthropathy of right shoulder   . Shoulder arthritis 06/15/2017  . Status post revision of  total knee replacement, left 04/29/2017  . Polyethylene liner wear following left total knee arthroplasty requiring isolated polyethylene liner exchange ([place]) 03/02/2017  . 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 Unstable angina ([place]) 05/21/2016  . Chronic diastolic heart failure ([place]) 03/29/2016  . Normal coronary arteries 2009 01/14/2015  . Obesity-BMI 40 01/14/2015  . Restless leg 01/14/2015  . Chest pain 01/14/2015  . Back pain 01/14/2015  . Renal insufficiency 01/14/2015  . Dementia- mild memory issues 01/14/2015  . Occult blood positive stool 12/14/2014  . Anemia 12/14/2014  . Family history of colon cancer 12/14/2014  . Change in bowel habits 12/14/2014  . GERD (gastroesophageal reflux disease) 12/14/2014  . Dyspnea 05/17/2012  . Lymphedema 05/17/2012  . Weight gain 05/17/2012  . HTN (hypertension) 05/17/2012    Andrey Spearman, MS, OTR/L, [facility] 12/02/20 1:03 PM  [place] MAIN [facility] SERVICES [address] [place], [place], [address] Phone: [phone]   Fax:  [phone]  Name: [name] MRN: [id number] Date of Birth: Jul 03, 1946

## 2020-12-02 NOTE — Telephone Encounter (Signed)
Pt was called and informed

## 2020-12-03 ENCOUNTER — Other Ambulatory Visit: Payer: Self-pay

## 2020-12-03 ENCOUNTER — Emergency Department: Payer: Medicare Other

## 2020-12-03 ENCOUNTER — Emergency Department
Admission: EM | Admit: 2020-12-03 | Discharge: 2020-12-03 | Disposition: A | Discharge status: Home / Self Care | Payer: Medicare Other | Attending: Emergency Medicine | Admitting: Emergency Medicine

## 2020-12-03 ENCOUNTER — Encounter: Payer: Self-pay | Admitting: Radiology

## 2020-12-03 DIAGNOSIS — Z79899 Other long term (current) drug therapy: Secondary | ICD-10-CM | POA: Diagnosis not present

## 2020-12-03 DIAGNOSIS — Z96611 Presence of right artificial shoulder joint: Secondary | ICD-10-CM | POA: Insufficient documentation

## 2020-12-03 DIAGNOSIS — S40012A Contusion of left shoulder, initial encounter: Secondary | ICD-10-CM | POA: Diagnosis not present

## 2020-12-03 DIAGNOSIS — Z7982 Long term (current) use of aspirin: Secondary | ICD-10-CM | POA: Insufficient documentation

## 2020-12-03 DIAGNOSIS — I130 Hypertensive heart and chronic kidney disease with heart failure and stage 1 through stage 4 chronic kidney disease, or unspecified chronic kidney disease: Secondary | ICD-10-CM | POA: Diagnosis not present

## 2020-12-03 DIAGNOSIS — W050XXA Fall from non-moving wheelchair, initial encounter: Secondary | ICD-10-CM | POA: Diagnosis not present

## 2020-12-03 DIAGNOSIS — Z96612 Presence of left artificial shoulder joint: Secondary | ICD-10-CM | POA: Diagnosis not present

## 2020-12-03 DIAGNOSIS — S0093XA Contusion of unspecified part of head, initial encounter: Secondary | ICD-10-CM | POA: Insufficient documentation

## 2020-12-03 DIAGNOSIS — E1122 Type 2 diabetes mellitus with diabetic chronic kidney disease: Secondary | ICD-10-CM | POA: Diagnosis not present

## 2020-12-03 DIAGNOSIS — M25562 Pain in left knee: Secondary | ICD-10-CM | POA: Diagnosis not present

## 2020-12-03 DIAGNOSIS — S0990XA Unspecified injury of head, initial encounter: Secondary | ICD-10-CM

## 2020-12-03 DIAGNOSIS — M25512 Pain in left shoulder: Secondary | ICD-10-CM

## 2020-12-03 DIAGNOSIS — I5032 Chronic diastolic (congestive) heart failure: Secondary | ICD-10-CM | POA: Diagnosis not present

## 2020-12-03 DIAGNOSIS — W19XXXA Unspecified fall, initial encounter: Secondary | ICD-10-CM

## 2020-12-03 DIAGNOSIS — Z96653 Presence of artificial knee joint, bilateral: Secondary | ICD-10-CM | POA: Insufficient documentation

## 2020-12-03 DIAGNOSIS — E039 Hypothyroidism, unspecified: Secondary | ICD-10-CM | POA: Insufficient documentation

## 2020-12-03 DIAGNOSIS — N183 Chronic kidney disease, stage 3 unspecified: Secondary | ICD-10-CM | POA: Diagnosis not present

## 2020-12-03 MED ORDER — OXYCODONE-ACETAMINOPHEN 5-325 MG PO TABS
1.0000 | ORAL_TABLET | Freq: Once | ORAL | Status: AC
  Administered 2020-12-03: 1 via ORAL
  Filled 2020-12-03: qty 1

## 2020-12-03 MED ORDER — OXYCODONE-ACETAMINOPHEN 5-325 MG PO TABS: 1.0000 | ORAL_TABLET | ORAL | 0 refills | Status: DC | PRN

## 2020-12-03 NOTE — ED Provider Notes (Signed)
[facility] Emergency Department Provider Note   ____________________________________________   Event Date/Time   First MD Initiated Contact with Patient 12/03/20 0011     (approximate)  I have reviewed the triage vital signs and the nursing notes.   HISTORY  Chief Complaint Fall    HPI [name] is a 75 y.o. female brought to the ED via EMS from [facility] status post mechanical fall.  Patient was getting out of her wheelchair and the wheels were not locked so she fell out, striking the vertex of her head and her left side.  Complains of head hematoma, left shoulder and left knee pain.  Does not think she takes anticoagulants.  Denies neck pain, chest pain, shortness of breath, abdominal pain, nausea, vomiting or dizziness.     Past Medical History:  Diagnosis Date  . Anemia   . Anxiety   . Arthritis    "hands, feet" (03/03/2017)  . Brain lesion    2 types  . Cataract   . Cervical spondylosis with myelopathy   . Chest pain    Normal cardiac cath 5/09  . Chronic lower back pain   . CKD (chronic kidney disease), stage III ([place])    [name]  . Congestive heart failure (CHF) ([place])    has a Cardiomems implant   . Constipation   . Dementia ([place])   . Depression   . Dumping syndrome   . Dyspnea    with activity  . Edema   . Fatigue   . Fibromyalgia   . GERD (gastroesophageal reflux disease)   . Headache    "w/high CBG" (03/03/2017)  . History of echocardiogram    a. Echo 03/20/16 (done at [facility] in [place], [place]):  mild LVH, EF 59%, normal diastolic function, mild LAE, MAC, RVSP 25 mmHg  . Hyperlipidemia   . Hypertension   . Hypothyroidism   . Lumbar spondylosis   . Lymphedema    seeing specialist for this  . Migraine    "mostly stopped when I changed my diet" (03/03/2017)  . OSA on CPAP   . Pneumonia X 1  . PONV (postoperative nausea and vomiting)   . Restless leg syndrome   . Rheumatoid arthritis  ([place])   . Stroke [facility])    TIAs "mini strokes"- unsure of last TIA - pt and daughter deny this  . Syncope   . Tremors of nervous system   . Type II diabetes mellitus [facility])     Patient Active Problem List   Diagnosis Date Noted  . Iron deficiency 08/06/2018  . History of total left knee replacement (TKR) 07/18/2018  . Rectal bleeding 07/05/2018  . Hemorrhoids 07/05/2018  . Anal itching 07/05/2018  . Rotator cuff arthropathy of right shoulder   . Shoulder arthritis 06/15/2017  . Status post revision of total knee replacement, left 04/29/2017  . Polyethylene liner wear following left total knee arthroplasty requiring isolated polyethylene liner exchange ([place]) 03/02/2017  . 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 Unstable angina ([place]) 05/21/2016  . Chronic diastolic heart failure ([place]) 03/29/2016  . Normal coronary arteries 2009 01/14/2015  . Obesity-BMI 40 01/14/2015  . Restless leg 01/14/2015  . Chest pain 01/14/2015  . Back pain 01/14/2015  . Renal insufficiency 01/14/2015  . Dementia- mild memory issues 01/14/2015  . Occult blood positive stool 12/14/2014  . Anemia 12/14/2014  . Family history of colon cancer 12/14/2014  . Change  in bowel habits 12/14/2014  . GERD (gastroesophageal reflux disease) 12/14/2014  . Dyspnea 05/17/2012  . Lymphedema 05/17/2012  . Weight gain 05/17/2012  . HTN (hypertension) 05/17/2012    Past Surgical History:  Procedure Laterality Date  . APPENDECTOMY  1966  . BACK SURGERY    . CARDIAC CATHETERIZATION N/A 05/25/2016   Procedure: Right/Left Heart Cath and Coronary Angiography;  Surgeon: Larey Dresser, MD;  Location: [facility];  Service: Cardiovascular;  Laterality: N/A;  . CATARACT EXTRACTION W/ INTRAOCULAR LENS  IMPLANT, BILATERAL Bilateral   . COLONOSCOPY    . CORONARY ANGIOGRAM  2009   Normal coronaries  . EXCISION/RELEASE BURSA HIP Bilateral   . I & D KNEE WITH POLY EXCHANGE Left 03/02/2017    Procedure: Left knee Revision of poly liner;  Surgeon: Mcarthur Rossetti, MD;  Location: [facility];  Service: Orthopedics;  Laterality: Left;  . JOINT REPLACEMENT    . KNEE ARTHROSCOPY Bilateral   . LAPAROSCOPIC CHOLECYSTECTOMY  2015  . LMF  2017   CardiMEMS HF implant for CHF (measures amount of fluid in the heart)  . LUMBAR DISC SURGERY    . LUMBAR LAMINECTOMY/DECOMPRESSION MICRODISCECTOMY Left 12/2005   L2-3 laminectomy and diskectomy/notes 01/06/2011  . POSTERIOR LUMBAR FUSION  04/2006   [name] 01/06/2011; "put cages in"  . REVERSE SHOULDER ARTHROPLASTY Left 06/15/2017   Procedure: REVERSE LEFT SHOULDER ARTHROPLASTY;  Surgeon: Meredith Pel, MD;  Location: [facility];  Service: Orthopedics;  Laterality: Left;  . REVERSE SHOULDER ARTHROPLASTY Right 02/01/2018  . REVERSE SHOULDER ARTHROPLASTY Right 02/01/2018   Procedure: RIGHT REVERSE SHOULDER ARTHROPLASTY;  Surgeon: Meredith Pel, MD;  Location: [facility];  Service: Orthopedics;  Laterality: Right;  . REVISION TOTAL KNEE ARTHROPLASTY Left 03/02/2017   poly liner/notes 03/02/2017  . RIGHT HEART CATH N/A 11/13/2016   Procedure: Right Heart Cath with Cardiomems;  Surgeon: Larey Dresser, MD;  Location: [facility];  Service: Cardiovascular;  Laterality: N/A;  . SHOULDER ARTHROSCOPY WITH ROTATOR CUFF REPAIR Right   . SHOULDER OPEN ROTATOR CUFF REPAIR Left 01/2011   [name] 01/29/2011  . TOTAL ABDOMINAL HYSTERECTOMY    . TOTAL KNEE ARTHROPLASTY Left   . TOTAL KNEE ARTHROPLASTY Right 11/18/2012   Procedure: TOTAL KNEE ARTHROPLASTY;  Surgeon: Yvette Rack., MD;  Location: [facility];  Service: Orthopedics;  Laterality: Right;  . TUBAL LIGATION    . UPPER GASTROINTESTINAL ENDOSCOPY    . UPPER GI ENDOSCOPY      Prior to Admission medications   Medication Sig Start Date End Date Taking? Authorizing Provider  oxyCODONE-acetaminophen (PERCOCET/ROXICET) 5-325 MG tablet Take 1 tablet by mouth every 4 (four) hours as needed for severe pain.  12/03/20  Yes Paulette Blanch, MD  acetaminophen (TYLENOL) 650 MG CR tablet Take 650 mg by mouth every 4 (four) hours as needed for pain.    [provider]  amLODipine (NORVASC) 2.5 MG tablet Take 2.5 mg by mouth daily.    [provider]  antiseptic oral rinse (BIOTENE) LIQD 10 mLs by Mouth Rinse route 4 (four) times daily. (0900, 1000, 1200 & 2100)    [provider]  aspirin EC 81 MG tablet Take 1 tablet (81 mg total) by mouth daily. 11/29/17   Larey Dresser, MD  atorvastatin (LIPITOR) 40 MG tablet Take 40 mg by mouth daily.    [provider]  baclofen (LIORESAL) 10 MG tablet Take 10 mg by mouth 3 (three) times daily. (0900, 1400, &  2100)    [provider]  budesonide (PULMICORT) 0.5 MG/2ML nebulizer solution Take 0.5 mg by nebulization 2 (two) times daily. (1000 & 2200)    [provider]  carvedilol (COREG) 6.25 MG tablet Take 6.25 mg by mouth 2 (two) times daily with a meal.    [provider]  Cholecalciferol (VITAMIN D3) 1000 units CAPS Take 1,000 Units by mouth daily. (0900)    [provider]  Cyanocobalamin (VITAMIN B-12 IJ) Inject 1,000 mcg as directed every 30 (thirty) days. Weekly for 4 weeks, has 1 more dose this week (week of 7/2) then will switch to once a month     [provider]  docusate sodium (COLACE) 100 MG capsule Take 100 mg by mouth 2 (two) times daily.    [provider]  DULoxetine (CYMBALTA) 30 MG capsule Take 30 mg by mouth daily. (0900)    [provider]  erythromycin ophthalmic ointment 1 application at bedtime.    [provider]  gabapentin (NEURONTIN) 300 MG capsule Take 400 mg by mouth 2 (two) times daily. (0900 & 2100)    [provider]  HYDROcodone-acetaminophen (NORCO) 7.5-325 MG tablet Take 1 tablet by mouth every 6 (six) hours as needed.  05/09/18   [provider]  ipratropium-albuterol (DUONEB) 0.5-2.5 (3) MG/3ML SOLN Take 3 mLs by  nebulization every 6 (six) hours as needed (FOR WHEEZING/SHORTNESS OF BREATH).    [provider]  lamoTRIgine (LAMICTAL) 100 MG tablet Take 100 mg by mouth 2 (two) times daily. (0900 & 2100) 11/03/14   [provider]  levothyroxine (SYNTHROID, LEVOTHROID) 88 MCG tablet Take 88 mcg by mouth daily before breakfast. (0900)    [provider]  loratadine (CLARITIN) 10 MG tablet Take 10 mg by mouth daily.    [provider]  Melatonin 3 MG CAPS Take 3 mg by mouth at bedtime. (2100)    [provider]  omeprazole (PRILOSEC) 20 MG capsule Take 20 mg by mouth 2 (two) times daily before a meal. (0900 & 1700)    [provider]  rOPINIRole (REQUIP) 2 MG tablet Take 2 mg by mouth 3 (three) times daily.    [provider]  sennosides-docusate sodium (SENOKOT-S) 8.6-50 MG tablet Take 1 tablet by mouth daily.    [provider]  sucralfate (CARAFATE) 1 GM/10ML suspension Take 1 g by mouth 4 (four) times daily -  with meals and at bedtime.    [provider]  torsemide (DEMADEX) 20 MG tablet Take 80 mg by mouth daily.    [provider]  traZODone (DESYREL) 50 MG tablet Take 50 mg by mouth at bedtime. (2100) 05/17/17   [provider]    Allergies Patient has no known allergies.  Family History  Problem Relation Age of Onset  . Heart disease Mother   . Heart failure Mother   . Kidney disease Mother   . Lung cancer Father   . Colon cancer Brother   . Cancer Brother        malignant thymoma  . Heart disease Brother   . Tremor Maternal Grandmother 90  . Ovarian cancer Maternal Grandmother   . Uterine cancer Maternal Grandmother   . Irritable bowel syndrome Daughter   . Esophageal cancer Neg Hx   . Stomach cancer Neg Hx   . Inflammatory bowel disease Neg Hx   . Liver disease Neg Hx   . Pancreatic cancer Neg Hx   . Rectal cancer Neg  Hx     Social History Social History   Tobacco Use  . Smoking  status: Never Smoker  . Smokeless tobacco: Never Used  Vaping Use  . Vaping Use: Never used  Substance Use Topics  . Alcohol use: No    Alcohol/week: 0.0 standard drinks  . Drug use: No    Review of Systems  Constitutional: No fever/chills Eyes: No visual changes. ENT: No sore throat. Cardiovascular: Denies chest pain. Respiratory: Denies shortness of breath. Gastrointestinal: No abdominal pain.  No nausea, no vomiting.  No diarrhea.  No constipation. Genitourinary: Negative for dysuria. Musculoskeletal: Positive for left shoulder and knee pain.  Negative for back pain. Skin: Negative for rash. Neurological: Positive for minor head injury.  Negative for headaches, focal weakness or numbness.   ____________________________________________   PHYSICAL EXAM:  VITAL SIGNS: ED Triage Vitals  Enc Vitals Group     BP 12/03/20 0013 (!) 151/65     Pulse Rate 12/03/20 0013 72     Resp 12/03/20 0013 (!) 22     Temp 12/03/20 0013 98.6 F (37 C)     Temp Source 12/03/20 0013 Oral     SpO2 12/03/20 0013 92 %     Weight 12/03/20 0015 258 lb (117 kg)     Height 12/03/20 0015 _0  (1.676 m)     Head Circumference --      [facility] --      Pain Score 12/03/20 0013 4     Pain Loc --      Pain Edu? --      Excl. in [place]? --     Constitutional: Alert and oriented.  Elderly appearing and in no acute distress. Eyes: Conjunctivae are normal. PERRL. EOMI. Head: Mild hematoma to vertex of head. Nose: No congestion/rhinnorhea. Mouth/Throat: Mucous membranes are mildly dry.  No dental malocclusion.   Neck: No stridor.  No cervical spine tenderness to palpation. Cardiovascular: Normal rate, regular rhythm. Grossly normal heart sounds.  Good peripheral circulation. Respiratory: Normal respiratory effort.  No retractions. Lungs CTAB. Gastrointestinal: Morbidly obese.  Soft and nontender. No distention. No abdominal bruits. No CVA tenderness. Musculoskeletal:  LUE: Anterior shoulder tender  to palpation.  Limited range of motion secondary to pain.  2+ radial pulse.  Brisk, less than 5-second capillary refill.  No spinal tenderness to palpation.  Pelvis is stable.  Right hip tender to palpation. BLE 2+ nonpitting edema.  No joint effusions. Neurologic:  Normal speech and language. No gross focal neurologic deficits are appreciated.  Skin:  Skin is warm, dry and intact. No rash noted. Psychiatric: Mood and affect are normal. Speech and behavior are normal.  ____________________________________________   LABS (all labs ordered are listed, but only abnormal results are displayed)  Labs Reviewed - No data to display ____________________________________________  EKG  None ____________________________________________  RADIOLOGY I, Calixto Pavel J, personally viewed and evaluated these images (plain radiographs) as part of my medical decision making, as well as reviewing the written report by the radiologist.  ED MD interpretation: No ICH, no traumatic injury to left shoulder, knee or right hip  Official radiology report(s): CT Head Wo Contrast  Result Date: 12/03/2020 CLINICAL DATA:  Status post trauma. EXAM: CT HEAD WITHOUT CONTRAST TECHNIQUE: Contiguous axial images were obtained from the base of the skull through the vertex without intravenous contrast. COMPARISON:  May 10, 2017 FINDINGS: Brain: There is mild cerebral atrophy with widening of the extra-axial spaces and ventricular dilatation. There are areas of decreased attenuation  within the [place] matter tracts of the supratentorial brain, consistent with microvascular disease changes. Vascular: No hyperdense vessel or unexpected calcification. Skull: Normal. Negative for fracture or focal lesion. Sinuses/Orbits: There is marked severity right maxillary sinus mucosal thickening. Other: None. IMPRESSION: 1. No acute intracranial abnormality. 2. Mild cerebral atrophy and microvascular disease changes of the supratentorial brain.  3. Marked severity right maxillary sinus disease. Electronically Signed   By: Virgina Norfolk M.D.   On: 12/03/2020 01:42   DG Shoulder Left  Result Date: 12/03/2020 CLINICAL DATA:  Fall with pain EXAM: LEFT SHOULDER - 2+ VIEW COMPARISON:  08/16/2019 FINDINGS: Reverse left shoulder arthroplasty with intact hardware and normal alignment. No acute fracture is seen. Chronic ununited fracture involving the acromion. IMPRESSION: No acute osseous abnormality. Electronically Signed   By: Donavan Foil M.D.   On: 12/03/2020 01:18   DG Knee Complete 4 Views Left  Result Date: 12/03/2020 CLINICAL DATA:  Fall with knee pain EXAM: LEFT KNEE - COMPLETE 4+ VIEW COMPARISON:  10/14/2020, 11/29/2019 FINDINGS: Left knee replacement with intact hardware and normal alignment. Ovoid masslike opacity on lateral view overlying the distal left femur and suprapatellar region without change, question chronic effusion. IMPRESSION: Left knee replacement without acute osseous abnormality. Electronically Signed   By: Donavan Foil M.D.   On: 12/03/2020 01:22   DG Hip Unilat With Pelvis 2-3 Views Right  Result Date: 12/03/2020 CLINICAL DATA:  Fall with hip pain EXAM: DG HIP (WITH OR WITHOUT PELVIS) 2-3V RIGHT COMPARISON:  10/04/2017 FINDINGS: SI joints are non widened. Pubic symphysis and rami appear intact. No fracture or malalignment. IMPRESSION: No acute osseous abnormality Electronically Signed   By: Donavan Foil M.D.   On: 12/03/2020 01:22    ____________________________________________   PROCEDURES  Procedure(s) performed (including Critical Care):  Procedures   ____________________________________________   INITIAL IMPRESSION / ASSESSMENT AND PLAN / ED COURSE  As part of my medical decision making, I reviewed the following data within the [facility] notes reviewed and incorporated, Old chart reviewed, Radiograph reviewed and Notes from prior ED visits     75 year old female who  presents status post mechanical fall from wheelchair with forehead hematoma, left shoulder and knee pain.  Right hip pain on examination.  Differential diagnosis includes but is not limited to [place], fracture, dislocation, musculoskeletal contusion, etc.  Obtain imaging studies of head, left shoulder, left knee, right hip.  Reassess.  Clinical Course as of 12/03/20 0445  Tue Dec 03, 2020  0154 Updated patient and family member on all imaging results.  Patient states she has taken Percocet before with good results.  Will administer Percocet now and write a prescription.  Strict return precautions given.  Both verbalized understanding agree with plan of care. [JS]    Clinical Course User Index [JS] Paulette Blanch, MD     ____________________________________________   FINAL CLINICAL IMPRESSION(S) / ED DIAGNOSES  Final diagnoses:  Fall, initial encounter  Injury of head, initial encounter  Acute pain of left shoulder  Acute pain of left knee  Contusion of left shoulder, initial encounter     ED Discharge Orders         Ordered    oxyCODONE-acetaminophen (PERCOCET/ROXICET) 5-325 MG tablet  Every 4 hours PRN        12/03/20 0157          *Please note:  [name] was evaluated in Emergency Department on 12/03/2020 for the symptoms described in the history of  present illness. She was evaluated in the context of the global COVID-19 pandemic, which necessitated consideration that the patient might be at risk for infection with the SARS-CoV-2 virus that causes COVID-19. Institutional protocols and algorithms that pertain to the evaluation of patients at risk for COVID-19 are in a state of rapid change based on information released by regulatory bodies including the CDC and federal and state organizations. These policies and algorithms were followed during the patient's care in the ED.  Some ED evaluations and interventions may be delayed as a result of limited staffing during and the  pandemic.*   Note:  This document was prepared using Dragon voice recognition software and may include unintentional dictation errors.   Paulette Blanch, MD 12/03/20 [phone]

## 2020-12-03 NOTE — Discharge Instructions (Addendum)
You may take Percocet as needed for pain.  Apply ice to affected area several times daily.  Return to the ER for worsening symptoms, persistent vomiting, lethargy or other concerns.

## 2020-12-03 NOTE — ED Triage Notes (Signed)
Pt is from [facility]. Pt was getting out of the chair and the wheels were not locked. Pt fell out of the wheel chair. Fell onto left side. Left shoulder and knee pain. Hematoma on top of head. Unknown blood thinner therapy.

## 2020-12-03 NOTE — ED Notes (Signed)
Called EMS for transport back to [facility]

## 2020-12-09 ENCOUNTER — Other Ambulatory Visit: Payer: Self-pay

## 2020-12-09 ENCOUNTER — Ambulatory Visit: Payer: Medicare Other | Admitting: Occupational Therapy

## 2020-12-09 DIAGNOSIS — I890 Lymphedema, not elsewhere classified: Secondary | ICD-10-CM | POA: Diagnosis not present

## 2020-12-09 NOTE — Therapy (Signed)
[place] MAIN [facility] SERVICES [address] [place], [place], [address] Phone: [phone]   Fax:  [phone]  Occupational Therapy Treatment Note and Progress Report:  Lymphedema Care  Patient Details  Name: [name] MRN: [id number] Date of Birth: 10-24-1945 Referring Provider (OT): Juluis Pitch, MD   Encounter Date: 12/09/2020   OT End of Session - 12/09/20 0814    Visit Number 90    Number of Visits 108    Date for OT Re-Evaluation 02/02/21    OT Start Time 0804    OT Stop Time 0910    OT Time Calculation (min) 66 min    Equipment Utilized During Treatment friction gloves and tyvek slipper    Activity Tolerance Patient tolerated treatment well;No increased pain;Other (comment);Treatment limited secondary to medical complications (Comment)           Past Medical History:  Diagnosis Date  . Anemia   . Anxiety   . Arthritis    "hands, feet" (03/03/2017)  . Brain lesion    2 types  . Cataract   . Cervical spondylosis with myelopathy   . Chest pain    Normal cardiac cath 5/09  . Chronic lower back pain   . CKD (chronic kidney disease), stage III ([place])    [name]  . Congestive heart failure (CHF) ([place])    has a Cardiomems implant   . Constipation   . Dementia ([place])   . Depression   . Dumping syndrome   . Dyspnea    with activity  . Edema   . Fatigue   . Fibromyalgia   . GERD (gastroesophageal reflux disease)   . Headache    "w/high CBG" (03/03/2017)  . History of echocardiogram    a. Echo 03/20/16 (done at [facility] in [place], [place]):  mild LVH, EF 69%, normal diastolic function, mild LAE, MAC, RVSP 25 mmHg  . Hyperlipidemia   . Hypertension   . Hypothyroidism   . Lumbar spondylosis   . Lymphedema    seeing specialist for this  . Migraine    "mostly stopped when I changed my diet" (03/03/2017)  . OSA on CPAP   . Pneumonia X 1  . PONV (postoperative nausea and vomiting)   . Restless  leg syndrome   . Rheumatoid arthritis ([place])   . Stroke [facility])    TIAs "mini strokes"- unsure of last TIA - pt and daughter deny this  . Syncope   . Tremors of nervous system   . Type II diabetes mellitus ([place])     Past Surgical History:  Procedure Laterality Date  . APPENDECTOMY  1966  . BACK SURGERY    . CARDIAC CATHETERIZATION N/A 05/25/2016   Procedure: Right/Left Heart Cath and Coronary Angiography;  Surgeon: Larey Dresser, MD;  Location: [place] [facility];  Service: Cardiovascular;  Laterality: N/A;  . CATARACT EXTRACTION W/ INTRAOCULAR LENS  IMPLANT, BILATERAL Bilateral   . COLONOSCOPY    . CORONARY ANGIOGRAM  2009   Normal coronaries  . EXCISION/RELEASE BURSA HIP Bilateral   . I & D KNEE WITH POLY EXCHANGE Left 03/02/2017   Procedure: Left knee Revision of poly liner;  Surgeon: Mcarthur Rossetti, MD;  Location: [place];  Service: Orthopedics;  Laterality: Left;  . JOINT REPLACEMENT    . KNEE ARTHROSCOPY Bilateral   . LAPAROSCOPIC CHOLECYSTECTOMY  2015  . LMF  2017   CardiMEMS HF implant for CHF (measures amount of  fluid in the heart)  . LUMBAR DISC SURGERY    . LUMBAR LAMINECTOMY/DECOMPRESSION MICRODISCECTOMY Left 12/2005   L2-3 laminectomy and diskectomy/notes 01/06/2011  . POSTERIOR LUMBAR FUSION  04/2006   [name] 01/06/2011; "put cages in"  . REVERSE SHOULDER ARTHROPLASTY Left 06/15/2017   Procedure: REVERSE LEFT SHOULDER ARTHROPLASTY;  Surgeon: Meredith Pel, MD;  Location: [place];  Service: Orthopedics;  Laterality: Left;  . REVERSE SHOULDER ARTHROPLASTY Right 02/01/2018  . REVERSE SHOULDER ARTHROPLASTY Right 02/01/2018   Procedure: RIGHT REVERSE SHOULDER ARTHROPLASTY;  Surgeon: Meredith Pel, MD;  Location: [place];  Service: Orthopedics;  Laterality: Right;  . REVISION TOTAL KNEE ARTHROPLASTY Left 03/02/2017   poly liner/notes 03/02/2017  . RIGHT HEART CATH N/A 11/13/2016   Procedure: Right Heart Cath with Cardiomems;  Surgeon: Larey Dresser, MD;   Location: [place] [facility];  Service: Cardiovascular;  Laterality: N/A;  . SHOULDER ARTHROSCOPY WITH ROTATOR CUFF REPAIR Right   . SHOULDER OPEN ROTATOR CUFF REPAIR Left 01/2011   [name] 01/29/2011  . TOTAL ABDOMINAL HYSTERECTOMY    . TOTAL KNEE ARTHROPLASTY Left   . TOTAL KNEE ARTHROPLASTY Right 11/18/2012   Procedure: TOTAL KNEE ARTHROPLASTY;  Surgeon: Yvette Rack., MD;  Location: [place];  Service: Orthopedics;  Laterality: Right;  . TUBAL LIGATION    . UPPER GASTROINTESTINAL ENDOSCOPY    . UPPER GI ENDOSCOPY      There were no vitals filed for this visit.   Subjective Assessment - 12/09/20 1004    Subjective  [name] presents to OT for Rx visit 90/108  to address BLE lipo-lymphedema (I89.0).Pt denies leg pain this morning. OA pain is unchanged.  Pt presents with compression garments in place  this morning. Pt reports there were new facility "travelers" over the weekend so she was not able to use Flexitouch. "I got my stockings on though."               LYMPHEDEMA/ONCOLOGY QUESTIONNAIRE - 12/09/20 0941      Right Lower Extremity Lymphedema   Other RLE limb volume from ankle to tibial tuberosity (A-D) =9266.3 ml.    Other RLE is DEcreased in volume from A-D by 2.7% SINCE LAST MEASURED ON 09/30/20. R leg (A-D) volume HAD ALSO DECreased by 11.8% when measured on 2/7. This downward trend may be partially due to better management of systemic fluid retantion over the past few months (?).      Left Lower Extremity Lymphedema   Other LLE A-D volume = 9254.3 ml.    Other L leg volume is decreased compared to last measurements (09/30/20) by 3.1%. On 2/7 volume reduction measured a dramatic 21.7% since 08/02/20.                   OT Treatments/Exercises (OP) - 12/09/20 8828      ADLs   ADL Education Given Yes      Manual Therapy   Manual Therapy Edema management    Manual therapy comments existing compression garments still fit relatively well, but show signs of wear and  tear and are in need of replacement.    Compression Bandaging Max A to don B stockings                  OT Education - 12/09/20 0937    Education Details Duscussed progress towards goals to date and formulated new goals throughout session. Discussed importance of showing progress to medically justify ongoing OT for this chronic, progressive lymphedema.  Discussed importance of formulating achievable, measurable and realistic goals to increase functional performance and limit LE progression.    Person(s) Educated Patient    Methods Explanation;Demonstration    Comprehension Verbalized understanding;Returned demonstration;Need further instruction               OT Long Term Goals - 12/09/20 0815      OT LONG TERM GOAL #1   Title Pt will be able to apply LLE multi-layer, short stretch compression wraps daily with maximum caregiver assistance, using correct gradient techniques to return affected limb/s, as closely as possible, to premorbid size and shape, to limit infection risk, and to improve safe functional mobility and ADLs performance.    Baseline dependent    Time 4    Period Days    Status Achieved   DC goal. MET     OT LONG TERM GOAL #2   Title Pt will be able to verbalize signs and symptoms of cellulitis infection and identify 4 common lymphedema precautions using printed resource for reference (modified independence) to limit LE progression over time.    Baseline Max A    Time 4    Period Days    Status Achieved   MET- DC Goal     OT LONG TERM GOAL #3   Title With Max CG assistance Pt will achieve %, or greater compliance with all daily LE self-care home program components throughout Intensive Phase CDT, including impeccable skin care, lymphatic pumping therex,  compression wraps;/ garments and devices and simple self MLD, or Flexitouch device daily to ensure optimal limb volume reduction, to limit infection risk and to limit LE progression.   Pt willing, but  Inconsistent and inadequate SNF staffing are greatest obstacles to achieving/ sustainging 85% compliance. At present Pt reports ~50% compliance with LE self-care. Goal modified to 60% compliance with Max A for next reporting period.   Baseline dependent    Time 12    Period Weeks    Status Revised    Target Date 02/02/21      OT LONG TERM GOAL #4   Title Pt to achieve at least 10% LLE limb volume reduction during Intensive Phase CDT with Max CG assistance for wrapping, to improve functional performance of basic and instrumental ADLs, and to limit LE progression.   to date LLE reductions: A-D= 15.7%, E-G= 29.63%, A-G= 22.9%. RLE: A-D: 6.7%, E-G= 15.7%, A-G= 11.2%   Baseline dependent    Time 12    Period Weeks    Status On-going   Limb volume continues to fluctuate, somcetimes dramatically. Continue regular OT for CDT in effort to stabilize  within goal range, for at least 6 consecutive months, to limit infection risk, LE progression and further functional decline.   Target Date 02/02/21      OT LONG TERM GOAL #5   Title During self-management phase of CDT Pt will retain limb volume reductions achieved during Intensive Phase CDT with no more than 3% volume increase using all LE self care components  to limit LE progression and further functional decline.    Baseline dependent    Time 6    Period Months    Status Not Met   DC GOAL. NOT MET     OT LONG TERM GOAL #6   Title Pt will increase compliance w/ LE self care home program by increasing skin care frequency from 1 x weekly to 3 x weekly  to limit LE progression and reduce infection risk.  Baseline Mod A    Time 12    Period Weeks    Status Revised    Target Date 02/02/21      OT LONG TERM GOAL #7   Title Pt will increase leg elevation during regular seated activities and routines by at least 30 minutes per day to limit LE progression and to better control swelling.Marland Kitchen   goal modified 12/09/20. Decreased time from 2 hrs to 30 minutes  in effort to make goal more realistic and achievable   Baseline Mod A    Time 12    Period Weeks    Status Revised   pt reports no progress . pt reports she has leg rest hangers, but then isn't able to get to the bathroom quickly enough   Target Date 02/02/21                 Plan - 12/09/20 1255    Clinical Impression Statement Emphasis of visit on assessiing progress towards goals and Pt edu for progress thus far, and new goals going forward. Most limited progress thus far has been on compliance goal for LE self care home program ( compression , skin care , ther ex, self-MLD w/ Flexitouch device). Greatest obstacle limiting progress towards this goal has been inconsistent SNF staff of traveling nurses and CNAs who are difficult to access and also this group does not have skills for donning custom garments in this seting. She is ovccasionally able to don garments herself when back pain is less intense, but this takes over an hour and she is unable to get them optimally positioned. Pt made new goal to increase frequency of skin care towards improving LE self care performance.    OT Occupational Profile and History Comprehensive Assessment- Review of records and extensive additional review of physical, cognitive, psychosocial history related to current functional performance    Occupational performance deficits (Please refer to evaluation for details): ADL's;IADL's;Rest and Sleep;Leisure;Social Participation;Other   body image   Body Structure / Function / Physical Skills ADL;Pain;Balance;Edema;IADL;Decreased knowledge of precautions;Skin integrity;Decreased knowledge of use of DME;ROM    Rehab Potential Good    Clinical Decision Making Multiple treatment options, significant modification of task necessary    Comorbidities Affecting Occupational Performance: Presence of comorbidities impacting occupational performance    Modification or Assistance to Complete Evaluation  Min-Moderate  modification of tasks or assist with assess necessary to complete eval    OT Frequency 1x / week    OT Duration 12 weeks    OT Treatment/Interventions Self-care/ADL training;DME and/or AE instruction;Manual lymph drainage;Compression bandaging;Therapeutic activities;Coping strategies training;Therapeutic exercise;Manual Therapy;Patient/family education;Other (comment)   tissue fibrosis massage; skin care to reduce tissue sensativity/ pain and increase hydration to limit infection risk and LE progression   Plan Complete Deconhgestive Therapy (CDT) Intensive and Self -Management phases, consisting of Manual lymphatic Drainage (MLD), skin care, ther ex and compression bandaging    OT Home Exercise Plan lymphatic pumping therex 2-3 x q day, in sequence, 10 reps bilaterally    Recommended Other Services Daytime: 2 Pr. [employer] EVAREX custom, ccl 2, A-D, 2.5 cm SB at oblique top edge, open toe, t heel.  HOS: [employer] RELAX custom ccl 2 A-D, OT w. posterior zipper    Consulted and Agree with Plan of Care Patient           Patient will benefit from skilled therapeutic intervention in order to improve the following deficits and impairments:   Body Structure / Function / Physical Skills:  ADL,Pain,Balance,Edema,IADL,Decreased knowledge of precautions,Skin integrity,Decreased knowledge of use of DME,ROM       Visit Diagnosis: Lymphedema, not elsewhere classified    Problem List Patient Active Problem List   Diagnosis Date Noted  . Iron deficiency 08/06/2018  . History of total left knee replacement (TKR) 07/18/2018  . Rectal bleeding 07/05/2018  . Hemorrhoids 07/05/2018  . Anal itching 07/05/2018  . Rotator cuff arthropathy of right shoulder   . Shoulder arthritis 06/15/2017  . Status post revision of total knee replacement, left 04/29/2017  . Polyethylene liner wear following left total knee arthroplasty requiring isolated polyethylene liner exchange ([place]) 03/02/2017  . 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 Unstable angina ([place]) 05/21/2016  . Chronic diastolic heart failure ([place]) 03/29/2016  . Normal coronary arteries 2009 01/14/2015  . Obesity-BMI 40 01/14/2015  . Restless leg 01/14/2015  . Chest pain 01/14/2015  . Back pain 01/14/2015  . Renal insufficiency 01/14/2015  . Dementia- mild memory issues 01/14/2015  . Occult blood positive stool 12/14/2014  . Anemia 12/14/2014  . Family history of colon cancer 12/14/2014  . Change in bowel habits 12/14/2014  . GERD (gastroesophageal reflux disease) 12/14/2014  . Dyspnea 05/17/2012  . Lymphedema 05/17/2012  . Weight gain 05/17/2012  . HTN (hypertension) 05/17/2012    Andrey Spearman, MS, OTR/L, [facility] 12/09/20 4:25 PM   [place] MAIN [facility] SERVICES [address] [place], [place], [address] Phone: [phone]   Fax:  [phone]  Name: [name] MRN: [id number] Date of Birth: 1946-07-05

## 2020-12-09 NOTE — Patient Instructions (Signed)

## 2020-12-16 ENCOUNTER — Other Ambulatory Visit: Payer: Self-pay

## 2020-12-16 ENCOUNTER — Ambulatory Visit: Payer: Medicare Other | Admitting: Occupational Therapy

## 2020-12-16 DIAGNOSIS — I890 Lymphedema, not elsewhere classified: Secondary | ICD-10-CM | POA: Diagnosis not present

## 2020-12-16 NOTE — Therapy (Signed)
[place] MAIN [facility] SERVICES [address] [place], [place], [address] Phone: [phone]   Fax:  [phone]  Occupational Therapy Treatment  Patient Details  Name: [name] MRN: [id number] Date of Birth: [birth year] Referring Provider (OT): Juluis Pitch, MD   Encounter Date: 12/16/2020   OT End of Session - 12/16/20 0912    Visit Number 91    Number of Visits 108    Date for OT Re-Evaluation 02/02/21    OT Start Time 0900    OT Stop Time 1005    OT Time Calculation (min) 65 min    Equipment Utilized During Treatment friction gloves and tyvek slipper    Activity Tolerance Patient tolerated treatment well;No increased pain;Other (comment);Treatment limited secondary to medical complications (Comment)           Past Medical History:  Diagnosis Date  . Anemia   . Anxiety   . Arthritis    "hands, feet" (03/03/2017)  . Brain lesion    2 types  . Cataract   . Cervical spondylosis with myelopathy   . Chest pain    Normal cardiac cath 5/09  . Chronic lower back pain   . CKD (chronic kidney disease), stage III ([place])    [name]  . Congestive heart failure (CHF) ([place])    has a Cardiomems implant   . Constipation   . Dementia ([place])   . Depression   . Dumping syndrome   . Dyspnea    with activity  . Edema   . Fatigue   . Fibromyalgia   . GERD (gastroesophageal reflux disease)   . Headache    "w/high CBG" (03/03/2017)  . History of echocardiogram    a. Echo 03/20/16 (done at [facility] in [place], [place]):  mild LVH, EF 74%, normal diastolic function, mild LAE, MAC, RVSP 25 mmHg  . Hyperlipidemia   . Hypertension   . Hypothyroidism   . Lumbar spondylosis   . Lymphedema    seeing specialist for this  . Migraine    "mostly stopped when I changed my diet" (03/03/2017)  . OSA on CPAP   . Pneumonia X 1  . PONV (postoperative nausea and vomiting)   . Restless leg syndrome   . Rheumatoid arthritis ([place])    . Stroke [facility])    TIAs "mini strokes"- unsure of last TIA - pt and daughter deny this  . Syncope   . Tremors of nervous system   . Type II diabetes mellitus ([employer])     Past Surgical History:  Procedure Laterality Date  . APPENDECTOMY  1966  . BACK SURGERY    . CARDIAC CATHETERIZATION N/A 05/25/2016   Procedure: Right/Left Heart Cath and Coronary Angiography;  Surgeon: Larey Dresser, MD;  Location: [place] [facility];  Service: Cardiovascular;  Laterality: N/A;  . CATARACT EXTRACTION W/ INTRAOCULAR LENS  IMPLANT, BILATERAL Bilateral   . COLONOSCOPY    . CORONARY ANGIOGRAM  2009   Normal coronaries  . EXCISION/RELEASE BURSA HIP Bilateral   . I & D KNEE WITH POLY EXCHANGE Left 03/02/2017   Procedure: Left knee Revision of poly liner;  Surgeon: Mcarthur Rossetti, MD;  Location: [place];  Service: Orthopedics;  Laterality: Left;  . JOINT REPLACEMENT    . KNEE ARTHROSCOPY Bilateral   . LAPAROSCOPIC CHOLECYSTECTOMY  2015  . LMF  2017   CardiMEMS HF implant for CHF (measures amount of fluid in the heart)  . LUMBAR  [place] [facility]    . LUMBAR LAMINECTOMY/DECOMPRESSION MICRODISCECTOMY Left 12/2005   L2-3 laminectomy and diskectomy/notes 01/06/2011  . POSTERIOR LUMBAR FUSION  04/2006   [name] 01/06/2011; "put cages in"  . REVERSE SHOULDER ARTHROPLASTY Left 06/15/2017   Procedure: REVERSE LEFT SHOULDER ARTHROPLASTY;  Surgeon: Meredith Pel, MD;  Location: [place];  Service: Orthopedics;  Laterality: Left;  . REVERSE SHOULDER ARTHROPLASTY Right 02/01/2018  . REVERSE SHOULDER ARTHROPLASTY Right 02/01/2018   Procedure: RIGHT REVERSE SHOULDER ARTHROPLASTY;  Surgeon: Meredith Pel, MD;  Location: [facility];  Service: Orthopedics;  Laterality: Right;  . REVISION TOTAL KNEE ARTHROPLASTY Left 03/02/2017   poly liner/notes 03/02/2017  . RIGHT HEART CATH N/A 11/13/2016   Procedure: Right Heart Cath with Cardiomems;  Surgeon: Larey Dresser, MD;  Location: [place] [facility];  Service:  Cardiovascular;  Laterality: N/A;  . SHOULDER ARTHROSCOPY WITH ROTATOR CUFF REPAIR Right   . SHOULDER OPEN ROTATOR CUFF REPAIR Left 01/2011   [name] 01/29/2011  . TOTAL ABDOMINAL HYSTERECTOMY    . TOTAL KNEE ARTHROPLASTY Left   . TOTAL KNEE ARTHROPLASTY Right 11/18/2012   Procedure: TOTAL KNEE ARTHROPLASTY;  Surgeon: Yvette Rack., MD;  Location: [place];  Service: Orthopedics;  Laterality: Right;  . TUBAL LIGATION    . UPPER GASTROINTESTINAL ENDOSCOPY    . UPPER GI ENDOSCOPY      There were no vitals filed for this visit.   Subjective Assessment - 12/16/20 0913    Subjective  [name] presents to OT for Rx visit 91/108  to address BLE lipo-lymphedema (I89.0).Pt denies leg pain this morning. OA pain is unchanged.  Pt presents with compression garments in place. Pt presents with significant BLE increase in limb volume symetrically below the knees this morning.                        OT Treatments/Exercises (OP) - 12/16/20 1304      ADLs   ADL Education Given Yes      Manual Therapy   Manual Therapy Edema management    Manual therapy comments existing compression garments still fit relatively well, but show signs of wear and tear and are in need of replacement.    Compression Bandaging Max A to don B stockings                  OT Education - 12/16/20 0913    Education Details Duscussed progress towards goals to date and formulated new goals throughout session. Discussed importance of showing progress to medically justify ongoing OT for this chronic, progressive lymphedema. Discussed importance of formulating achievable, measurable and realistic goals to increase functional performance and limit LE progression.    Person(s) Educated Patient    Methods Explanation;Demonstration    Comprehension Verbalized understanding;Returned demonstration;Need further instruction               OT Long Term Goals - 12/09/20 0815      OT LONG TERM GOAL #1   Title Pt  will be able to apply LLE multi-layer, short stretch compression wraps daily with maximum caregiver assistance, using correct gradient techniques to return affected limb/s, as closely as possible, to premorbid size and shape, to limit infection risk, and to improve safe functional mobility and ADLs performance.    Baseline dependent    Time 4    Period Days    Status Achieved   DC goal. MET     OT LONG TERM GOAL #  2   Title Pt will be able to verbalize signs and symptoms of cellulitis infection and identify 4 common lymphedema precautions using printed resource for reference (modified independence) to limit LE progression over time.    Baseline Max A    Time 4    Period Days    Status Achieved   MET- DC Goal     OT LONG TERM GOAL #3   Title With Max CG assistance Pt will achieve %, or greater compliance with all daily LE self-care home program components throughout Intensive Phase CDT, including impeccable skin care, lymphatic pumping therex,  compression wraps;/ garments and devices and simple self MLD, or Flexitouch device daily to ensure optimal limb volume reduction, to limit infection risk and to limit LE progression.   Pt willing, but Inconsistent and inadequate SNF staffing are greatest obstacles to achieving/ sustainging 85% compliance. At present Pt reports ~50% compliance with LE self-care. Goal modified to 60% compliance with Max A for next reporting period.   Baseline dependent    Time 12    Period Weeks    Status Revised    Target Date 02/02/21      OT LONG TERM GOAL #4   Title Pt to achieve at least 10% LLE limb volume reduction during Intensive Phase CDT with Max CG assistance for wrapping, to improve functional performance of basic and instrumental ADLs, and to limit LE progression.   to date LLE reductions: A-D= 15.7%, E-G= 29.63%, A-G= 22.9%. RLE: A-D: 6.7%, E-G= 15.7%, A-G= 11.2%   Baseline dependent    Time 12    Period Weeks    Status On-going   Limb volume continues to  fluctuate, somcetimes dramatically. Continue regular OT for CDT in effort to stabilize  within goal range, for at least 6 consecutive months, to limit infection risk, LE progression and further functional decline.   Target Date 02/02/21      OT LONG TERM GOAL #5   Title During self-management phase of CDT Pt will retain limb volume reductions achieved during Intensive Phase CDT with no more than 3% volume increase using all LE self care components  to limit LE progression and further functional decline.    Baseline dependent    Time 6    Period Months    Status Not Met   DC GOAL. NOT MET     OT LONG TERM GOAL #6   Title Pt will increase compliance w/ LE self care home program by increasing skin care frequency from 1 x weekly to 3 x weekly  to limit LE progression and reduce infection risk.    Baseline Mod A    Time 12    Period Weeks    Status Revised    Target Date 02/02/21      OT LONG TERM GOAL #7   Title Pt will increase leg elevation during regular seated activities and routines by at least 30 minutes per day to limit LE progression and to better control swelling.Marland Kitchen   goal modified 12/09/20. Decreased time from 2 hrs to 30 minutes in effort to make goal more realistic and achievable   Baseline Mod A    Time 12    Period Weeks    Status Revised   pt reports no progress . pt reports she has leg rest hangers, but then isn't able to get to the bathroom quickly enough   Target Date 02/02/21  Plan - 12/16/20 1258    Clinical Impression Statement BLE are significantly more swollen this morning with symmetrical ptresentation below the knees. Large symetrical fluctuations are typically associated with fluid retention , which exacerbate lymphedema. Noted daily weight check is helpful too for monitoring fluid retention and effect of diuretic medication. We had planned to cpmmence custom replacement garment measurements today., but this is postponed as limb swelling is too  much increased today. Provided MLD as established and Max A to don   compression stocking. Completed communication note to [facility] team. Cont 1 x weekly to limit LE progression and to reduce infection risk.    OT Occupational Profile and History Comprehensive Assessment- Review of records and extensive additional review of physical, cognitive, psychosocial history related to current functional performance    Occupational performance deficits (Please refer to evaluation for details): ADL's;IADL's;Rest and Sleep;Leisure;Social Participation;Other   body image   Body Structure / Function / Physical Skills ADL;Pain;Balance;Edema;IADL;Decreased knowledge of precautions;Skin integrity;Decreased knowledge of use of DME;ROM    Rehab Potential Good    Clinical Decision Making Multiple treatment options, significant modification of task necessary    Comorbidities Affecting Occupational Performance: Presence of comorbidities impacting occupational performance    Modification or Assistance to Complete Evaluation  Min-Moderate modification of tasks or assist with assess necessary to complete eval    OT Frequency 1x / week    OT Duration 12 weeks    OT Treatment/Interventions Self-care/ADL training;DME and/or AE instruction;Manual lymph drainage;Compression bandaging;Therapeutic activities;Coping strategies training;Therapeutic exercise;Manual Therapy;Patient/family education;Other (comment)   tissue fibrosis massage; skin care to reduce tissue sensativity/ pain and increase hydration to limit infection risk and LE progression   Plan Complete Deconhgestive Therapy (CDT) Intensive and Self -Management phases, consisting of Manual lymphatic Drainage (MLD), skin care, ther ex and compression bandaging    OT Home Exercise Plan lymphatic pumping therex 2-3 x q day, in sequence, 10 reps bilaterally    Recommended Other Services Daytime: 2 Pr. [employer]st EVAREX custom, ccl 2, A-D, 2.5 cm SB at oblique top edge, open toe, t heel.   HOS: Jobst RELAX custom ccl 2 A-D, OT w. posterior zipper    Consulted and Agree with Plan of Care Patient           Patient will benefit from skilled therapeutic intervention in order to improve the following deficits and impairments:   Body Structure / Function / Physical Skills: ADL,Pain,Balance,Edema,IADL,Decreased knowledge of precautions,Skin integrity,Decreased knowledge of use of DME,ROM       Visit Diagnosis: Lymphedema, not elsewhere classified    Problem List Patient Active Problem List   Diagnosis Date Noted  . Iron deficiency 08/06/2018  . History of total left knee replacement (TKR) 07/18/2018  . Rectal bleeding 07/05/2018  . Hemorrhoids 07/05/2018  . Anal itching 07/05/2018  . Rotator cuff arthropathy of right shoulder   . Shoulder arthritis 06/15/2017  . Status post revision of total knee replacement, left 04/29/2017  . Polyethylene liner wear following left total knee arthroplasty requiring isolated polyethylene liner exchange ([place]) 03/02/2017  . 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 Unstable angina ([place]) 05/21/2016  . Chronic diastolic heart failure ([place]) 03/29/2016  . Normal coronary arteries 2009 01/14/2015  . Obesity-BMI 40 01/14/2015  . Restless leg 01/14/2015  . Chest pain 01/14/2015  . Back pain 01/14/2015  . Renal insufficiency 01/14/2015  . Dementia- mild memory issues 01/14/2015  . Occult blood positive stool 12/14/2014  . Anemia 12/14/2014  . Family history  of colon cancer 12/14/2014  . Change in bowel habits 12/14/2014  . GERD (gastroesophageal reflux disease) 12/14/2014  . Dyspnea 05/17/2012  . Lymphedema 05/17/2012  . Weight gain 05/17/2012  . HTN (hypertension) 05/17/2012   Andrey Spearman, MS, OTR/L, [facility] 12/16/20 4:06 PM   [place] MAIN [facility] SERVICES [address] [place], [place], [address] Phone: [phone]   Fax:  [phone]  Name: [name] MRN: [id number] Date of Birth: 12-06-1945

## 2020-12-23 ENCOUNTER — Ambulatory Visit: Payer: Medicare Other | Admitting: Occupational Therapy

## 2020-12-27 ENCOUNTER — Ambulatory Visit: Payer: Medicare Other | Attending: Nephrology | Admitting: Occupational Therapy

## 2020-12-27 ENCOUNTER — Other Ambulatory Visit: Payer: Self-pay

## 2020-12-27 ENCOUNTER — Ambulatory Visit: Payer: Medicare Other | Admitting: Occupational Therapy

## 2020-12-27 DIAGNOSIS — I890 Lymphedema, not elsewhere classified: Secondary | ICD-10-CM

## 2020-12-27 NOTE — Therapy (Signed)
[place] MAIN [facility] SERVICES [address] [place], [place], [address] Phone: [phone]   Fax:  [phone]  Occupational Therapy Treatment  Patient Details  Name: MERLYN BOLLEN MRN: [id number] Date of Birth: 1946-02-02 Referring Provider (OT): Juluis Pitch, MD   Encounter Date: 12/27/2020   OT End of Session - 12/27/20 1011    Visit Number 91    Number of Visits 108    Date for OT Re-Evaluation 02/02/21    OT Start Time 1003    OT Stop Time 1108    OT Time Calculation (min) 65 min    Equipment Utilized During Treatment friction gloves and tyvek slipper    Activity Tolerance Patient tolerated treatment well;No increased pain;Other (comment);Treatment limited secondary to medical complications (Comment)           Past Medical History:  Diagnosis Date  . Anemia   . Anxiety   . Arthritis    "hands, feet" (03/03/2017)  . Brain lesion    2 types  . Cataract   . Cervical spondylosis with myelopathy   . Chest pain    Normal cardiac cath 5/09  . Chronic lower back pain   . CKD (chronic kidney disease), stage III ([place])    [name]  . Congestive heart failure (CHF) ([place])    has a Cardiomems implant   . Constipation   . Dementia ([place])   . Depression   . Dumping syndrome   . Dyspnea    with activity  . Edema   . Fatigue   . Fibromyalgia   . GERD (gastroesophageal reflux disease)   . Headache    "w/high CBG" (03/03/2017)  . History of echocardiogram    a. Echo 03/20/16 (done at [facility] in [place], [place]):  mild LVH, EF 71%, normal diastolic function, mild LAE, MAC, RVSP 25 mmHg  . Hyperlipidemia   . Hypertension   . Hypothyroidism   . Lumbar spondylosis   . Lymphedema    seeing specialist for this  . Migraine    "mostly stopped when I changed my diet" (03/03/2017)  . OSA on CPAP   . Pneumonia X 1  . PONV (postoperative nausea and vomiting)   . Restless leg syndrome   . Rheumatoid arthritis ([place])    . Stroke [facility])    TIAs "mini strokes"- unsure of last TIA - pt and daughter deny this  . Syncope   . Tremors of nervous system   . Type II diabetes mellitus ([place])     Past Surgical History:  Procedure Laterality Date  . APPENDECTOMY  1966  . BACK SURGERY    . CARDIAC CATHETERIZATION N/A 05/25/2016   Procedure: Right/Left Heart Cath and Coronary Angiography;  Surgeon: Larey Dresser, MD;  Location: [place] CV LAB;  Service: Cardiovascular;  Laterality: N/A;  . CATARACT EXTRACTION W/ INTRAOCULAR LENS  IMPLANT, BILATERAL Bilateral   . COLONOSCOPY    . CORONARY ANGIOGRAM  2009   Normal coronaries  . EXCISION/RELEASE BURSA HIP Bilateral   . I & D KNEE WITH POLY EXCHANGE Left 03/02/2017   Procedure: Left knee Revision of poly liner;  Surgeon: Mcarthur Rossetti, MD;  Location: [place];  Service: Orthopedics;  Laterality: Left;  . JOINT REPLACEMENT    . KNEE ARTHROSCOPY Bilateral   . LAPAROSCOPIC CHOLECYSTECTOMY  2015  . LMF  2017   CardiMEMS HF implant for CHF (measures amount of fluid in the heart)  . LUMBAR  [place] SURGERY    . LUMBAR LAMINECTOMY/DECOMPRESSION MICRODISCECTOMY Left 12/2005   L2-3 laminectomy and diskectomy/notes 01/06/2011  . POSTERIOR LUMBAR FUSION  04/2006   [name] 01/06/2011; "put cages in"  . REVERSE SHOULDER ARTHROPLASTY Left 06/15/2017   Procedure: REVERSE LEFT SHOULDER ARTHROPLASTY;  Surgeon: Meredith Pel, MD;  Location: [place];  Service: Orthopedics;  Laterality: Left;  . REVERSE SHOULDER ARTHROPLASTY Right 02/01/2018  . REVERSE SHOULDER ARTHROPLASTY Right 02/01/2018   Procedure: RIGHT REVERSE SHOULDER ARTHROPLASTY;  Surgeon: Meredith Pel, MD;  Location: [place];  Service: Orthopedics;  Laterality: Right;  . REVISION TOTAL KNEE ARTHROPLASTY Left 03/02/2017   poly liner/notes 03/02/2017  . RIGHT HEART CATH N/A 11/13/2016   Procedure: Right Heart Cath with Cardiomems;  Surgeon: Larey Dresser, MD;  Location: [place] CV LAB;  Service:  Cardiovascular;  Laterality: N/A;  . SHOULDER ARTHROSCOPY WITH ROTATOR CUFF REPAIR Right   . SHOULDER OPEN ROTATOR CUFF REPAIR Left 01/2011   [name] 01/29/2011  . TOTAL ABDOMINAL HYSTERECTOMY    . TOTAL KNEE ARTHROPLASTY Left   . TOTAL KNEE ARTHROPLASTY Right 11/18/2012   Procedure: TOTAL KNEE ARTHROPLASTY;  Surgeon: Yvette Rack., MD;  Location: [place];  Service: Orthopedics;  Laterality: Right;  . TUBAL LIGATION    . UPPER GASTROINTESTINAL ENDOSCOPY    . UPPER GI ENDOSCOPY      There were no vitals filed for this visit.   Subjective Assessment - 12/27/20 1012    Subjective  Mrs Gilbertson presents to OT for Rx visit 92/108  to address BLE lipo-lymphedema (I89.0).Pt denies LE related leg pain this morning.   Pt presents with compression garments in place. She reports her weight is decreased from 269 down to 253 pounds in 5 weeks. Pt reports medial L  knee and leg pain is 7/10. She had tylenol before session.    Pertinent History chronic BLE leg swelling and associated pain ~ 40 years; hx herniated intervertebral discL TKA; L total knee revision; HTN, Obesity. dementia; restless leg; CHF; unstable anina; R RC arthroplasty, OA, cervical spondylosis with myelopathy, chronic lower back pain, CKD (Stage?); Fibromyalgia; Hypothyroidism; OSA ( cPap?); hx pneumonia; RA, Type II Diabetes. S/P cardiac cathg, B THA, Lumbar laminectomy, posterior lumbar fusion, B shoulder arthroplasty; total abdominal hysterectomy    Limitations chronic leg swelling and associated pain, chronic OA pain, difficulty walking, impaired balance, muscle weakness, decreased knee hip and shoulder AROM, decreased standing tolerance    Special Tests - STEMMER sign base of toes bilaterally    Currently in Pain? Yes                        OT Treatments/Exercises (OP) - 12/27/20 1017      ADLs   ADL Education Given Yes (P)       Manual Therapy   Manual Therapy Edema management (P)     Manual therapy comments existing  compression garments still fit relatively well, but show signs of wear and tear and are in need of replacement. (P)     Edema Management skin care to RLE throughout MLD with low ph castor oil to limit infection risk (P)     Manual Lymphatic Drainage (MLD) RLE MLD utilizing short neck sequence, deep abdominal pathways, functional inguinal LN, and proximal to distal J strokes from groin to ankles. (P)     Compression Bandaging Max A to don L stockings (P)  OT Education - 12/27/20 1231    Education Details Continued skilled Pt/caregiver education  And LE ADL training throughout visit for lymphedema self care/ home program, including compression wrapping, compression garment and device wear/care, lymphatic pumping ther ex, simple self-MLD, and skin care. Discussed progress towards goals and OT POC going forward.    Person(s) Educated Patient    Methods Explanation;Demonstration    Comprehension Verbalized understanding;Returned demonstration;Need further instruction               OT Long Term Goals - 12/09/20 0815      OT LONG TERM GOAL #1   Title Pt will be able to apply LLE multi-layer, short stretch compression wraps daily with maximum caregiver assistance, using correct gradient techniques to return affected limb/s, as closely as possible, to premorbid size and shape, to limit infection risk, and to improve safe functional mobility and ADLs performance.    Baseline dependent    Time 4    Period Days    Status Achieved   DC goal. MET     OT LONG TERM GOAL #2   Title Pt will be able to verbalize signs and symptoms of cellulitis infection and identify 4 common lymphedema precautions using printed resource for reference (modified independence) to limit LE progression over time.    Baseline Max A    Time 4    Period Days    Status Achieved   MET- DC Goal     OT LONG TERM GOAL #3   Title With Max CG assistance Pt will achieve %, or greater compliance with all  daily LE self-care home program components throughout Intensive Phase CDT, including impeccable skin care, lymphatic pumping therex,  compression wraps;/ garments and devices and simple self MLD, or Flexitouch device daily to ensure optimal limb volume reduction, to limit infection risk and to limit LE progression.   Pt willing, but Inconsistent and inadequate SNF staffing are greatest obstacles to achieving/ sustainging 85% compliance. At present Pt reports ~50% compliance with LE self-care. Goal modified to 60% compliance with Max A for next reporting period.   Baseline dependent    Time 12    Period Weeks    Status Revised    Target Date 02/02/21      OT LONG TERM GOAL #4   Title Pt to achieve at least 10% LLE limb volume reduction during Intensive Phase CDT with Max CG assistance for wrapping, to improve functional performance of basic and instrumental ADLs, and to limit LE progression.   to date LLE reductions: A-D= 15.7%, E-G= 29.63%, A-G= 22.9%. RLE: A-D: 6.7%, E-G= 15.7%, A-G= 11.2%   Baseline dependent    Time 12    Period Weeks    Status On-going   Limb volume continues to fluctuate, somcetimes dramatically. Continue regular OT for CDT in effort to stabilize  within goal range, for at least 6 consecutive months, to limit infection risk, LE progression and further functional decline.   Target Date 02/02/21      OT LONG TERM GOAL #5   Title During self-management phase of CDT Pt will retain limb volume reductions achieved during Intensive Phase CDT with no more than 3% volume increase using all LE self care components  to limit LE progression and further functional decline.    Baseline dependent    Time 6    Period Months    Status Not Met   DC GOAL. NOT MET     OT LONG TERM GOAL #6  Title Pt will increase compliance w/ LE self care home program by increasing skin care frequency from 1 x weekly to 3 x weekly  to limit LE progression and reduce infection risk.    Baseline Mod A     Time 12    Period Weeks    Status Revised    Target Date 02/02/21      OT LONG TERM GOAL #7   Title Pt will increase leg elevation during regular seated activities and routines by at least 30 minutes per day to limit LE progression and to better control swelling.Marland Kitchen   goal modified 12/09/20. Decreased time from 2 hrs to 30 minutes in effort to make goal more realistic and achievable   Baseline Mod A    Time 12    Period Weeks    Status Revised   pt reports no progress . pt reports she has leg rest hangers, but then isn't able to get to the bathroom quickly enough   Target Date 02/02/21                 Plan - 12/27/20 1231    Clinical Impression Statement BLE lymphedema mildly decreased today compared w last visit. Limb swelling continues to fluctuate moderately despite ongoing weekly OT for LE care. Daily wight check at SNF should assist with improved management of systemc fluid retention which has significant impact on lymphatic congestion and limb volume fluctuation. Pt tolerated MLD  well today without increased pain. Will hold off on ordering garment replacements until limb volumes are more stable. Cont as per POC, 1 x weekly ongoing, for optimal  LE management to limit progression and further decline in function.    OT Occupational Profile and History Comprehensive Assessment- Review of records and extensive additional review of physical, cognitive, psychosocial history related to current functional performance    Occupational performance deficits (Please refer to evaluation for details): ADL's;IADL's;Rest and Sleep;Leisure;Social Participation;Other   body image   Body Structure / Function / Physical Skills ADL;Pain;Balance;Edema;IADL;Decreased knowledge of precautions;Skin integrity;Decreased knowledge of use of DME;ROM    Rehab Potential Good    Clinical Decision Making Multiple treatment options, significant modification of task necessary    Comorbidities Affecting Occupational  Performance: Presence of comorbidities impacting occupational performance    Modification or Assistance to Complete Evaluation  Min-Moderate modification of tasks or assist with assess necessary to complete eval    OT Frequency 1x / week    OT Duration 12 weeks    OT Treatment/Interventions Self-care/ADL training;DME and/or AE instruction;Manual lymph drainage;Compression bandaging;Therapeutic activities;Coping strategies training;Therapeutic exercise;Manual Therapy;Patient/family education;Other (comment)   tissue fibrosis massage; skin care to reduce tissue sensativity/ pain and increase hydration to limit infection risk and LE progression   Plan Complete Deconhgestive Therapy (CDT) Intensive and Self -Management phases, consisting of Manual lymphatic Drainage (MLD), skin care, ther ex and compression bandaging    OT Home Exercise Plan lymphatic pumping therex 2-3 x q day, in sequence, 10 reps bilaterally    Recommended Other Services Daytime: 2 Pr. [employer] EVAREX custom, ccl 2, A-D, 2.5 cm SB at oblique top edge, open toe, t heel.  HOS: [employer] RELAX custom ccl 2 A-D, OT w. posterior zipper    Consulted and Agree with Plan of Care Patient           Patient will benefit from skilled therapeutic intervention in order to improve the following deficits and impairments:   Body Structure / Function / Physical Skills: ADL,Pain,Balance,Edema,IADL,Decreased knowledge of precautions,Skin  integrity,Decreased knowledge of use of DME,ROM       Visit Diagnosis: Lymphedema, not elsewhere classified    Problem List Patient Active Problem List   Diagnosis Date Noted  . Iron deficiency 08/06/2018  . History of total left knee replacement (TKR) 07/18/2018  . Rectal bleeding 07/05/2018  . Hemorrhoids 07/05/2018  . Anal itching 07/05/2018  . Rotator cuff arthropathy of right shoulder   . Shoulder arthritis 06/15/2017  . Status post revision of total knee replacement, left 04/29/2017  . 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 Unstable angina ([place]) 05/21/2016  . Chronic diastolic heart failure ([place]) 03/29/2016  . Normal coronary arteries 2009 01/14/2015  . Obesity-BMI 40 01/14/2015  . Restless leg 01/14/2015  . Chest pain 01/14/2015  . Back pain 01/14/2015  . Renal insufficiency 01/14/2015  . Dementia- mild memory issues 01/14/2015  . Occult blood positive stool 12/14/2014  . Anemia 12/14/2014  . Family history of colon cancer 12/14/2014  . Change in bowel habits 12/14/2014  . GERD (gastroesophageal reflux disease) 12/14/2014  . Dyspnea 05/17/2012  . Lymphedema 05/17/2012  . Weight gain 05/17/2012  . HTN (hypertension) 05/17/2012    Andrey Spearman, MS, OTR/L, [facility] 12/27/20 12:35 PM  [place] MAIN [facility] SERVICES [address] [place], [place], [address] Phone: [phone]   Fax:  [phone]  Name: AHNIYA MITCHUM MRN: [id number] Date of Birth: 10-Oct-1945

## 2020-12-30 ENCOUNTER — Other Ambulatory Visit: Payer: Self-pay

## 2020-12-30 ENCOUNTER — Ambulatory Visit: Payer: Medicare Other | Admitting: Occupational Therapy

## 2020-12-30 DIAGNOSIS — I890 Lymphedema, not elsewhere classified: Secondary | ICD-10-CM

## 2020-12-30 NOTE — Therapy (Signed)
[place] MAIN [facility] SERVICES [address] [place], [place], [address] Phone: [phone]   Fax:  [phone]  Occupational Therapy Treatment  Patient Details  Name: [name] MRN: [id number] Date of Birth: 1945-10-08 Referring Provider (OT): Juluis Pitch, MD   Encounter Date: 12/30/2020   OT End of Session - 12/30/20 0819    Visit Number 93    Number of Visits 108    Date for OT Re-Evaluation 02/02/21    OT Start Time 0814    OT Stop Time 0905    OT Time Calculation (min) 51 min    Equipment Utilized During Treatment friction gloves and tyvek slipper    Activity Tolerance Patient tolerated treatment well;No increased pain;Other (comment);Treatment limited secondary to medical complications (Comment)           Past Medical History:  Diagnosis Date  . Anemia   . Anxiety   . Arthritis    "hands, feet" (03/03/2017)  . Brain lesion    2 types  . Cataract   . Cervical spondylosis with myelopathy   . Chest pain    Normal cardiac cath 5/09  . Chronic lower back pain   . CKD (chronic kidney disease), stage III ([place])    [name]  . Congestive heart failure (CHF) ([place])    has a Cardiomems implant   . Constipation   . Dementia ([place])   . Depression   . Dumping syndrome   . Dyspnea    with activity  . Edema   . Fatigue   . Fibromyalgia   . GERD (gastroesophageal reflux disease)   . Headache    "w/high CBG" (03/03/2017)  . History of echocardiogram    a. Echo 03/20/16 (done at [facility] in [place], [place]):  mild LVH, EF 16%, normal diastolic function, mild LAE, MAC, RVSP 25 mmHg  . Hyperlipidemia   . Hypertension   . Hypothyroidism   . Lumbar spondylosis   . Lymphedema    seeing specialist for this  . Migraine    "mostly stopped when I changed my diet" (03/03/2017)  . OSA on CPAP   . Pneumonia X 1  . PONV (postoperative nausea and vomiting)   . Restless leg syndrome   . Rheumatoid arthritis ([place])    . Stroke [name])    TIAs "mini strokes"- unsure of last TIA - pt and daughter deny this  . Syncope   . Tremors of nervous system   . Type II diabetes mellitus ([place])     Past Surgical History:  Procedure Laterality Date  . APPENDECTOMY  1966  . BACK SURGERY    . CARDIAC CATHETERIZATION N/A 05/25/2016   Procedure: Right/Left Heart Cath and Coronary Angiography;  Surgeon: Larey Dresser, MD;  Location: [place] [facility];  Service: Cardiovascular;  Laterality: N/A;  . CATARACT EXTRACTION W/ INTRAOCULAR LENS  IMPLANT, BILATERAL Bilateral   . COLONOSCOPY    . CORONARY ANGIOGRAM  2009   Normal coronaries  . EXCISION/RELEASE BURSA HIP Bilateral   . I & D KNEE WITH POLY EXCHANGE Left 03/02/2017   Procedure: Left knee Revision of poly liner;  Surgeon: Mcarthur Rossetti, MD;  Location: [place];  Service: Orthopedics;  Laterality: Left;  . JOINT REPLACEMENT    . KNEE ARTHROSCOPY Bilateral   . LAPAROSCOPIC CHOLECYSTECTOMY  2015  . LMF  2017   CardiMEMS HF implant for CHF (measures amount of fluid in the heart)  . LUMBAR  [place] SURGERY    . LUMBAR LAMINECTOMY/DECOMPRESSION MICRODISCECTOMY Left 12/2005   L2-3 laminectomy and diskectomy/notes 01/06/2011  . POSTERIOR LUMBAR FUSION  04/2006   [name] 01/06/2011; "put cages in"  . REVERSE SHOULDER ARTHROPLASTY Left 06/15/2017   Procedure: REVERSE LEFT SHOULDER ARTHROPLASTY;  Surgeon: Meredith Pel, MD;  Location: [place];  Service: Orthopedics;  Laterality: Left;  . REVERSE SHOULDER ARTHROPLASTY Right 02/01/2018  . REVERSE SHOULDER ARTHROPLASTY Right 02/01/2018   Procedure: RIGHT REVERSE SHOULDER ARTHROPLASTY;  Surgeon: Meredith Pel, MD;  Location: [place];  Service: Orthopedics;  Laterality: Right;  . REVISION TOTAL KNEE ARTHROPLASTY Left 03/02/2017   poly liner/notes 03/02/2017  . RIGHT HEART CATH N/A 11/13/2016   Procedure: Right Heart Cath with Cardiomems;  Surgeon: Larey Dresser, MD;  Location: [place] [facility];  Service:  Cardiovascular;  Laterality: N/A;  . SHOULDER ARTHROSCOPY WITH ROTATOR CUFF REPAIR Right   . SHOULDER OPEN ROTATOR CUFF REPAIR Left 01/2011   [name] 01/29/2011  . TOTAL ABDOMINAL HYSTERECTOMY    . TOTAL KNEE ARTHROPLASTY Left   . TOTAL KNEE ARTHROPLASTY Right 11/18/2012   Procedure: TOTAL KNEE ARTHROPLASTY;  Surgeon: Yvette Rack., MD;  Location: [place];  Service: Orthopedics;  Laterality: Right;  . TUBAL LIGATION    . UPPER GASTROINTESTINAL ENDOSCOPY    . UPPER GI ENDOSCOPY      There were no vitals filed for this visit.   Subjective Assessment - 12/30/20 1556    Subjective  Mrs Canelo presents to OT for Rx visit 93/108  to address BLE lipo-lymphedema (I89.0).Pt is tearful re being late for session due to transportation problems from SNF. Pt denies LE related leg pain this    Pt presents with BLE compression garments in place.    Pertinent History chronic BLE leg swelling and associated pain ~ 40 years; hx herniated intervertebral discL TKA; L total knee revision; HTN, Obesity. dementia; restless leg; CHF; unstable anina; R RC arthroplasty, OA, cervical spondylosis with myelopathy, chronic lower back pain, CKD (Stage?); Fibromyalgia; Hypothyroidism; OSA ( cPap?); hx pneumonia; RA, Type II Diabetes. S/P cardiac cathg, B THA, Lumbar laminectomy, posterior lumbar fusion, B shoulder arthroplasty; total abdominal hysterectomy    Limitations chronic leg swelling and associated pain, chronic OA pain, difficulty walking, impaired balance, muscle weakness, decreased knee hip and shoulder AROM, decreased standing tolerance    Special Tests - STEMMER sign base of toes bilaterally                        OT Treatments/Exercises (OP) - 12/30/20 1558      ADLs   ADL Education Given Yes      Manual Therapy   Manual Therapy Edema management    Compression Bandaging Max A to don BLE comptression garments                  OT Education - 12/30/20 1559    Education Details Continued  skilled Pt/caregiver education  And LE ADL training throughout visit for lymphedema self care/ home program, including compression wrapping, compression garment and device wear/care, lymphatic pumping ther ex, simple self-MLD, and skin care. Discussed progress towards goals and OT POC going forward.    Person(s) Educated Patient    Methods Explanation;Demonstration    Comprehension Verbalized understanding;Returned demonstration;Need further instruction               OT Long Term Goals - 12/09/20 0815      OT LONG  TERM GOAL #1   Title Pt will be able to apply LLE multi-layer, short stretch compression wraps daily with maximum caregiver assistance, using correct gradient techniques to return affected limb/s, as closely as possible, to premorbid size and shape, to limit infection risk, and to improve safe functional mobility and ADLs performance.    Baseline dependent    Time 4    Period Days    Status Achieved   DC goal. MET     OT LONG TERM GOAL #2   Title Pt will be able to verbalize signs and symptoms of cellulitis infection and identify 4 common lymphedema precautions using printed resource for reference (modified independence) to limit LE progression over time.    Baseline Max A    Time 4    Period Days    Status Achieved   MET- DC Goal     OT LONG TERM GOAL #3   Title With Max CG assistance Pt will achieve %, or greater compliance with all daily LE self-care home program components throughout Intensive Phase CDT, including impeccable skin care, lymphatic pumping therex,  compression wraps;/ garments and devices and simple self MLD, or Flexitouch device daily to ensure optimal limb volume reduction, to limit infection risk and to limit LE progression.   Pt willing, but Inconsistent and inadequate SNF staffing are greatest obstacles to achieving/ sustainging 85% compliance. At present Pt reports ~50% compliance with LE self-care. Goal modified to 60% compliance with Max A for next  reporting period.   Baseline dependent    Time 12    Period Weeks    Status Revised    Target Date 02/02/21      OT LONG TERM GOAL #4   Title Pt to achieve at least 10% LLE limb volume reduction during Intensive Phase CDT with Max CG assistance for wrapping, to improve functional performance of basic and instrumental ADLs, and to limit LE progression.   to date LLE reductions: A-D= 15.7%, E-G= 29.63%, A-G= 22.9%. RLE: A-D: 6.7%, E-G= 15.7%, A-G= 11.2%   Baseline dependent    Time 12    Period Weeks    Status On-going   Limb volume continues to fluctuate, somcetimes dramatically. Continue regular OT for CDT in effort to stabilize  within goal range, for at least 6 consecutive months, to limit infection risk, LE progression and further functional decline.   Target Date 02/02/21      OT LONG TERM GOAL #5   Title During self-management phase of CDT Pt will retain limb volume reductions achieved during Intensive Phase CDT with no more than 3% volume increase using all LE self care components  to limit LE progression and further functional decline.    Baseline dependent    Time 6    Period Months    Status Not Met   DC GOAL. NOT MET     OT LONG TERM GOAL #6   Title Pt will increase compliance w/ LE self care home program by increasing skin care frequency from 1 x weekly to 3 x weekly  to limit LE progression and reduce infection risk.    Baseline Mod A    Time 12    Period Weeks    Status Revised    Target Date 02/02/21      OT LONG TERM GOAL #7   Title Pt will increase leg elevation during regular seated activities and routines by at least 30 minutes per day to limit LE progression and to better control swelling.Marland Kitchen  goal modified 12/09/20. Decreased time from 2 hrs to 30 minutes in effort to make goal more realistic and achievable   Baseline Mod A    Time 12    Period Weeks    Status Revised   pt reports no progress . pt reports she has leg rest hangers, but then isn't able to get to the  bathroom quickly enough   Target Date 02/02/21                 Plan - 12/30/20 1559    Clinical Impression Statement Existing custom compression garments are worn out and in need of replacement. Completed anatomical measurements for replacements for custom, flat knit, ccl 2, knee length, Elvarex CLASSIC compression stockings. Max A to don stockings after session. Cont as per POC.    OT Occupational Profile and History Comprehensive Assessment- Review of records and extensive additional review of physical, cognitive, psychosocial history related to current functional performance    Occupational performance deficits (Please refer to evaluation for details): ADL's;IADL's;Rest and Sleep;Leisure;Social Participation;Other   body image   Body Structure / Function / Physical Skills ADL;Pain;Balance;Edema;IADL;Decreased knowledge of precautions;Skin integrity;Decreased knowledge of use of DME;ROM    Rehab Potential Good    Clinical Decision Making Multiple treatment options, significant modification of task necessary    Comorbidities Affecting Occupational Performance: Presence of comorbidities impacting occupational performance    Modification or Assistance to Complete Evaluation  Min-Moderate modification of tasks or assist with assess necessary to complete eval    OT Frequency 1x / week    OT Duration 12 weeks    OT Treatment/Interventions Self-care/ADL training;DME and/or AE instruction;Manual lymph drainage;Compression bandaging;Therapeutic activities;Coping strategies training;Therapeutic exercise;Manual Therapy;Patient/family education;Other (comment)   tissue fibrosis massage; skin care to reduce tissue sensativity/ pain and increase hydration to limit infection risk and LE progression   Plan Complete Deconhgestive Therapy (CDT) Intensive and Self -Management phases, consisting of Manual lymphatic Drainage (MLD), skin care, ther ex and compression bandaging    OT Home Exercise Plan lymphatic  pumping therex 2-3 x q day, in sequence, 10 reps bilaterally    Recommended Other Services Daytime: 2 Pr. Jobst EVAREX custom, ccl 2, A-D, 2.5 cm SB at oblique top edge, open toe, t heel.  HOS: Jobst RELAX custom ccl 2 A-D, OT w. posterior zipper    Consulted and Agree with Plan of Care Patient           Patient will benefit from skilled therapeutic intervention in order to improve the following deficits and impairments:   Body Structure / Function / Physical Skills: ADL,Pain,Balance,Edema,IADL,Decreased knowledge of precautions,Skin integrity,Decreased knowledge of use of DME,ROM       Visit Diagnosis: Lymphedema, not elsewhere classified    Problem List Patient Active Problem List   Diagnosis Date Noted  . Iron deficiency 08/06/2018  . History of total left knee replacement (TKR) 07/18/2018  . Rectal bleeding 07/05/2018  . Hemorrhoids 07/05/2018  . Anal itching 07/05/2018  . Rotator cuff arthropathy of right shoulder   . Shoulder arthritis 06/15/2017  . Status post revision of total knee replacement, left 04/29/2017  . Polyethylene liner wear following left total knee arthroplasty requiring isolated polyethylene liner exchange ([place]) 03/02/2017  . 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 Unstable angina ([place]) 05/21/2016  . Chronic diastolic heart failure ([place]) 03/29/2016  . Normal coronary arteries 2009 01/14/2015  . Obesity-BMI 40 01/14/2015  . Restless leg 01/14/2015  . Chest pain 01/14/2015  .  Back pain 01/14/2015  . Renal insufficiency 01/14/2015  . Dementia- mild memory issues 01/14/2015  . Occult blood positive stool 12/14/2014  . Anemia 12/14/2014  . Family history of colon cancer 12/14/2014  . Change in bowel habits 12/14/2014  . GERD (gastroesophageal reflux disease) 12/14/2014  . Dyspnea 05/17/2012  . Lymphedema 05/17/2012  . Weight gain 05/17/2012  . HTN (hypertension) 05/17/2012    Andrey Spearman, MS, OTR/L,  [facility] 12/30/20 4:07 PM  [place] MAIN [facility] SERVICES [address] [place], [place], [address] Phone: [phone]   Fax:  [phone]  Name: [name] MRN: [id number] Date of Birth: 11/21/1945

## 2021-01-06 ENCOUNTER — Ambulatory Visit: Payer: Medicare Other | Admitting: Occupational Therapy

## 2021-01-06 ENCOUNTER — Emergency Department: Payer: Medicare Other

## 2021-01-06 ENCOUNTER — Emergency Department
Admission: EM | Admit: 2021-01-06 | Discharge: 2021-01-06 | Disposition: A | Discharge status: Home / Self Care | Payer: Medicare Other | Attending: Emergency Medicine | Admitting: Emergency Medicine

## 2021-01-06 ENCOUNTER — Other Ambulatory Visit: Payer: Self-pay

## 2021-01-06 DIAGNOSIS — W19XXXA Unspecified fall, initial encounter: Secondary | ICD-10-CM | POA: Diagnosis not present

## 2021-01-06 DIAGNOSIS — Z96653 Presence of artificial knee joint, bilateral: Secondary | ICD-10-CM | POA: Insufficient documentation

## 2021-01-06 DIAGNOSIS — I5032 Chronic diastolic (congestive) heart failure: Secondary | ICD-10-CM | POA: Diagnosis not present

## 2021-01-06 DIAGNOSIS — Z20822 Contact with and (suspected) exposure to covid-19: Secondary | ICD-10-CM | POA: Insufficient documentation

## 2021-01-06 DIAGNOSIS — M25552 Pain in left hip: Secondary | ICD-10-CM | POA: Diagnosis present

## 2021-01-06 DIAGNOSIS — I130 Hypertensive heart and chronic kidney disease with heart failure and stage 1 through stage 4 chronic kidney disease, or unspecified chronic kidney disease: Secondary | ICD-10-CM | POA: Diagnosis not present

## 2021-01-06 DIAGNOSIS — N183 Chronic kidney disease, stage 3 unspecified: Secondary | ICD-10-CM | POA: Diagnosis not present

## 2021-01-06 DIAGNOSIS — E119 Type 2 diabetes mellitus without complications: Secondary | ICD-10-CM | POA: Diagnosis not present

## 2021-01-06 DIAGNOSIS — Z79899 Other long term (current) drug therapy: Secondary | ICD-10-CM | POA: Diagnosis not present

## 2021-01-06 DIAGNOSIS — Z7982 Long term (current) use of aspirin: Secondary | ICD-10-CM | POA: Diagnosis not present

## 2021-01-06 DIAGNOSIS — E039 Hypothyroidism, unspecified: Secondary | ICD-10-CM | POA: Insufficient documentation

## 2021-01-06 DIAGNOSIS — F0390 Unspecified dementia without behavioral disturbance: Secondary | ICD-10-CM | POA: Insufficient documentation

## 2021-01-06 LAB — CBC WITH DIFFERENTIAL/PLATELET
Abs Immature Granulocytes: 0.02 10*3/uL (ref 0.00–0.07)
Basophils Absolute: 0.1 10*3/uL (ref 0.0–0.1)
Basophils Relative: 1 %
Eosinophils Absolute: 0.2 10*3/uL (ref 0.0–0.5)
Eosinophils Relative: 2 %
HCT: 35.4 % — ABNORMAL LOW (ref 36.0–46.0)
Hemoglobin: 10.9 g/dL — ABNORMAL LOW (ref 12.0–15.0)
Immature Granulocytes: 0 %
Lymphocytes Relative: 33 %
Lymphs Abs: 2.4 10*3/uL (ref 0.7–4.0)
MCH: 27.3 pg (ref 26.0–34.0)
MCHC: 30.8 g/dL (ref 30.0–36.0)
MCV: 88.5 fL (ref 80.0–100.0)
Monocytes Absolute: 0.9 10*3/uL (ref 0.1–1.0)
Monocytes Relative: 12 %
Neutro Abs: 3.7 10*3/uL (ref 1.7–7.7)
Neutrophils Relative %: 52 %
Platelets: 228 10*3/uL (ref 150–400)
RBC: 4 MIL/uL (ref 3.87–5.11)
RDW: 16.9 % — ABNORMAL HIGH (ref 11.5–15.5)
WBC: 7.2 10*3/uL (ref 4.0–10.5)
nRBC: 0 % (ref 0.0–0.2)

## 2021-01-06 LAB — COMPREHENSIVE METABOLIC PANEL
ALT: 16 U/L (ref 0–44)
AST: 26 U/L (ref 15–41)
Albumin: 4 g/dL (ref 3.5–5.0)
Alkaline Phosphatase: 91 U/L (ref 38–126)
Anion gap: 10 (ref 5–15)
BUN: 36 mg/dL — ABNORMAL HIGH (ref 8–23)
CO2: 32 mmol/L (ref 22–32)
Calcium: 9.4 mg/dL (ref 8.9–10.3)
Chloride: 98 mmol/L (ref 98–111)
Creatinine, Ser: 1.25 mg/dL — ABNORMAL HIGH (ref 0.44–1.00)
GFR, Estimated: 45 mL/min — ABNORMAL LOW (ref >60)
Glucose, Bld: 114 mg/dL — ABNORMAL HIGH (ref 70–99)
Potassium: 3.8 mmol/L (ref 3.5–5.1)
Sodium: 140 mmol/L (ref 135–145)
Total Bilirubin: 0.7 mg/dL (ref 0.3–1.2)
Total Protein: 7.2 g/dL (ref 6.5–8.1)

## 2021-01-06 LAB — RESP PANEL BY RT-PCR (FLU A&B, COVID) ARPGX2
Influenza A by PCR: NEGATIVE
Influenza B by PCR: NEGATIVE
SARS Coronavirus 2 by RT PCR: NEGATIVE

## 2021-01-06 NOTE — ED Notes (Signed)
Transport requested

## 2021-01-06 NOTE — ED Provider Notes (Signed)
[facility]-EMERGENCY DEPARTMENT  ____________________________________________  Time seen: Approximately 3:32 PM  I have reviewed the triage vital signs and the nursing notes.   HISTORY  Chief Complaint Fall (Accidental fall )   Historian Patient     HPI [name] is a 75 y.o. female presents to the emergency department after an unwitnessed fall. Patient is a resident at [facility].  Patient is primarily complaining of left hip pain and has been unable to stand or ambulate since injury occurred.  She is unsure whether or not she hit her head or neck.  No abrasions or lacerations.  No numbness or tingling in the upper and lower extremities.   Past Medical History:  Diagnosis Date  . Anemia   . Anxiety   . Arthritis    "hands, feet" (03/03/2017)  . Brain lesion    2 types  . Cataract   . Cervical spondylosis with myelopathy   . Chest pain    Normal cardiac cath 5/09  . Chronic lower back pain   . CKD (chronic kidney disease), stage III ([place])    [name]  . Congestive heart failure (CHF) ([place])    has a Cardiomems implant   . Constipation   . Dementia ([place])   . Depression   . Dumping syndrome   . Dyspnea    with activity  . Edema   . Fatigue   . Fibromyalgia   . GERD (gastroesophageal reflux disease)   . Headache    "w/high CBG" (03/03/2017)  . History of echocardiogram    a. Echo 03/20/16 (done at [facility] in [place], [place]):  mild LVH, EF 95%, normal diastolic function, mild LAE, MAC, RVSP 25 mmHg  . Hyperlipidemia   . Hypertension   . Hypothyroidism   . Lumbar spondylosis   . Lymphedema    seeing specialist for this  . Migraine    "mostly stopped when I changed my diet" (03/03/2017)  . OSA on CPAP   . Pneumonia X 1  . PONV (postoperative nausea and vomiting)   . Restless leg syndrome   . Rheumatoid arthritis ([place])   . Stroke [facility])    TIAs "mini strokes"- unsure of last TIA - pt and daughter deny this  . Syncope   . Tremors  of nervous system   . Type II diabetes mellitus ([place])      Immunizations up to date:  Yes.     Past Medical History:  Diagnosis Date  . Anemia   . Anxiety   . Arthritis    "hands, feet" (03/03/2017)  . Brain lesion    2 types  . Cataract   . Cervical spondylosis with myelopathy   . Chest pain    Normal cardiac cath 5/09  . Chronic lower back pain   . CKD (chronic kidney disease), stage III ([place])    [name]  . Congestive heart failure (CHF) ([place])    has a Cardiomems implant   . Constipation   . Dementia ([place])   . Depression   . Dumping syndrome   . Dyspnea    with activity  . Edema   . Fatigue   . Fibromyalgia   . GERD (gastroesophageal reflux disease)   . Headache    "w/high CBG" (03/03/2017)  . History of echocardiogram    a. Echo 03/20/16 (done at [facility] in [place], [place]):  mild LVH, EF 63%, normal diastolic function, mild LAE, MAC, RVSP 25 mmHg  .  Hyperlipidemia   . Hypertension   . Hypothyroidism   . Lumbar spondylosis   . Lymphedema    seeing specialist for this  . Migraine    "mostly stopped when I changed my diet" (03/03/2017)  . OSA on CPAP   . Pneumonia X 1  . PONV (postoperative nausea and vomiting)   . Restless leg syndrome   . Rheumatoid arthritis ([place])   . Stroke [facility])    TIAs "mini strokes"- unsure of last TIA - pt and daughter deny this  . Syncope   . Tremors of nervous system   . Type II diabetes mellitus [facility])     Patient Active Problem List   Diagnosis Date Noted  . Iron deficiency 08/06/2018  . History of total left knee replacement (TKR) 07/18/2018  . Rectal bleeding 07/05/2018  . Hemorrhoids 07/05/2018  . Anal itching 07/05/2018  . Rotator cuff arthropathy of right shoulder   . Shoulder arthritis 06/15/2017  . Status post revision of total knee replacement, left 04/29/2017  . Polyethylene liner wear following left total knee arthroplasty requiring isolated polyethylene liner exchange ([place]) 03/02/2017  .  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 Unstable angina ([place]) 05/21/2016  . Chronic diastolic heart failure ([place]) 03/29/2016  . Normal coronary arteries 2009 01/14/2015  . Obesity-BMI 40 01/14/2015  . Restless leg 01/14/2015  . Chest pain 01/14/2015  . Back pain 01/14/2015  . Renal insufficiency 01/14/2015  . Dementia- mild memory issues 01/14/2015  . Occult blood positive stool 12/14/2014  . Anemia 12/14/2014  . Family history of colon cancer 12/14/2014  . Change in bowel habits 12/14/2014  . GERD (gastroesophageal reflux disease) 12/14/2014  . Dyspnea 05/17/2012  . Lymphedema 05/17/2012  . Weight gain 05/17/2012  . HTN (hypertension) 05/17/2012    Past Surgical History:  Procedure Laterality Date  . APPENDECTOMY  1966  . BACK SURGERY    . CARDIAC CATHETERIZATION N/A 05/25/2016   Procedure: Right/Left Heart Cath and Coronary Angiography;  Surgeon: Larey Dresser, MD;  Location: [place] [facility];  Service: Cardiovascular;  Laterality: N/A;  . CATARACT EXTRACTION W/ INTRAOCULAR LENS  IMPLANT, BILATERAL Bilateral   . COLONOSCOPY    . CORONARY ANGIOGRAM  2009   Normal coronaries  . EXCISION/RELEASE BURSA HIP Bilateral   . I & D KNEE WITH POLY EXCHANGE Left 03/02/2017   Procedure: Left knee Revision of poly liner;  Surgeon: Mcarthur Rossetti, MD;  Location: [place];  Service: Orthopedics;  Laterality: Left;  . JOINT REPLACEMENT    . KNEE ARTHROSCOPY Bilateral   . LAPAROSCOPIC CHOLECYSTECTOMY  2015  . LMF  2017   CardiMEMS HF implant for CHF (measures amount of fluid in the heart)  . LUMBAR DISC SURGERY    . LUMBAR LAMINECTOMY/DECOMPRESSION MICRODISCECTOMY Left 12/2005   L2-3 laminectomy and diskectomy/notes 01/06/2011  . POSTERIOR LUMBAR FUSION  04/2006   [name] 01/06/2011; "put cages in"  . REVERSE SHOULDER ARTHROPLASTY Left 06/15/2017   Procedure: REVERSE LEFT SHOULDER ARTHROPLASTY;  Surgeon: Meredith Pel, MD;  Location: [place];   Service: Orthopedics;  Laterality: Left;  . REVERSE SHOULDER ARTHROPLASTY Right 02/01/2018  . REVERSE SHOULDER ARTHROPLASTY Right 02/01/2018   Procedure: RIGHT REVERSE SHOULDER ARTHROPLASTY;  Surgeon: Meredith Pel, MD;  Location: [place];  Service: Orthopedics;  Laterality: Right;  . REVISION TOTAL KNEE ARTHROPLASTY Left 03/02/2017   poly liner/notes 03/02/2017  . RIGHT HEART CATH N/A 11/13/2016   Procedure: Right Heart Cath with Cardiomems;  Surgeon: Larey Dresser, MD;  Location: [place] [facility];  Service: Cardiovascular;  Laterality: N/A;  . SHOULDER ARTHROSCOPY WITH ROTATOR CUFF REPAIR Right   . SHOULDER OPEN ROTATOR CUFF REPAIR Left 01/2011   [name] 01/29/2011  . TOTAL ABDOMINAL HYSTERECTOMY    . TOTAL KNEE ARTHROPLASTY Left   . TOTAL KNEE ARTHROPLASTY Right 11/18/2012   Procedure: TOTAL KNEE ARTHROPLASTY;  Surgeon: Yvette Rack., MD;  Location: [place];  Service: Orthopedics;  Laterality: Right;  . TUBAL LIGATION    . UPPER GASTROINTESTINAL ENDOSCOPY    . UPPER GI ENDOSCOPY      Prior to Admission medications   Medication Sig Start Date End Date Taking? Authorizing Provider  acetaminophen (TYLENOL) 650 MG CR tablet Take 650 mg by mouth every 4 (four) hours as needed for pain.    [provider]  amLODipine (NORVASC) 2.5 MG tablet Take 2.5 mg by mouth daily.    [provider]  antiseptic oral rinse (BIOTENE) LIQD 10 mLs by Mouth Rinse route 4 (four) times daily. (0900, 1000, 1200 & 2100)    [provider]  aspirin EC 81 MG tablet Take 1 tablet (81 mg total) by mouth daily. 11/29/17   Larey Dresser, MD  atorvastatin (LIPITOR) 40 MG tablet Take 40 mg by mouth daily.    [provider]  baclofen (LIORESAL) 10 MG tablet Take 10 mg by mouth 3 (three) times daily. (0900, 1400, & 2100)    [provider]  budesonide (PULMICORT) 0.5 MG/2ML nebulizer solution Take 0.5 mg by nebulization 2 (two) times daily. (1000 & 2200)    [provider]  carvedilol (COREG) 6.25 MG tablet Take 6.25 mg by mouth 2 (two) times daily with a meal.    [provider]  Cholecalciferol (VITAMIN D3) 1000 units CAPS Take 1,000 Units by mouth daily. (0900)    [provider]  Cyanocobalamin (VITAMIN B-12 IJ) Inject 1,000 mcg as directed every 30 (thirty) days. Weekly for 4 weeks, has 1 more dose this week (week of 7/2) then will switch to once a month     [provider]  docusate sodium (COLACE) 100 MG capsule Take 100 mg by mouth 2 (two) times daily.    [provider]  DULoxetine (CYMBALTA) 30 MG capsule Take 30 mg by mouth daily. (0900)    [provider]  erythromycin ophthalmic ointment 1 application at bedtime.    [provider]  gabapentin (NEURONTIN) 300 MG capsule Take 400 mg by mouth 2 (two) times daily. (0900 & 2100)    [provider]  HYDROcodone-acetaminophen (NORCO) 7.5-325 MG tablet Take 1 tablet by mouth every 6 (six) hours as needed.  05/09/18   [provider]  ipratropium-albuterol (DUONEB) 0.5-2.5 (3) MG/3ML SOLN Take 3 mLs by nebulization every 6 (six) hours as needed (FOR WHEEZING/SHORTNESS OF BREATH).    [provider]  lamoTRIgine (LAMICTAL) 100 MG tablet Take 100 mg by mouth 2 (two) times daily. (0900 & 2100) 11/03/14   [provider]  levothyroxine (SYNTHROID, LEVOTHROID) 88 MCG tablet Take 88 mcg by mouth daily before breakfast. (0900)    [provider]  loratadine (CLARITIN) 10 MG tablet Take 10 mg by mouth daily.    [provider]  Melatonin 3 MG CAPS Take 3 mg by mouth at bedtime. (2100)    [provider]  omeprazole (PRILOSEC) 20 MG capsule Take 20 mg by mouth 2 (two) times daily before a  meal. (0900 & 1700)    [provider]  oxyCODONE-acetaminophen (PERCOCET/ROXICET) 5-325 MG tablet Take 1 tablet by mouth every 4 (four) hours as needed for severe pain. 12/03/20   Paulette Blanch, MD   rOPINIRole (REQUIP) 2 MG tablet Take 2 mg by mouth 3 (three) times daily.    [provider]  sennosides-docusate sodium (SENOKOT-S) 8.6-50 MG tablet Take 1 tablet by mouth daily.    [provider]  sucralfate (CARAFATE) 1 GM/10ML suspension Take 1 g by mouth 4 (four) times daily -  with meals and at bedtime.    [provider]  torsemide (DEMADEX) 20 MG tablet Take 80 mg by mouth daily.    [provider]  traZODone (DESYREL) 50 MG tablet Take 50 mg by mouth at bedtime. (2100) 05/17/17   [provider]    Allergies Patient has no known allergies.  Family History  Problem Relation Age of Onset  . Heart disease Mother   . Heart failure Mother   . Kidney disease Mother   . Lung cancer Father   . Colon cancer Brother   . Cancer Brother        malignant thymoma  . Heart disease Brother   . Tremor Maternal Grandmother 90  . Ovarian cancer Maternal Grandmother   . Uterine cancer Maternal Grandmother   . Irritable bowel syndrome Daughter   . Esophageal cancer Neg Hx   . Stomach cancer Neg Hx   . Inflammatory bowel disease Neg Hx   . Liver disease Neg Hx   . Pancreatic cancer Neg Hx   . Rectal cancer Neg Hx     Social History Social History   Tobacco Use  . Smoking status: Never Smoker  . Smokeless tobacco: Never Used  Vaping Use  . Vaping Use: Never used  Substance Use Topics  . Alcohol use: No    Alcohol/week: 0.0 standard drinks  . Drug use: No     Review of Systems  Constitutional: No fever/chills Eyes:  No discharge ENT: No upper respiratory complaints. Respiratory: no cough. No SOB/ use of accessory muscles to breath Gastrointestinal:   No nausea, no vomiting.  No diarrhea.  No constipation. Musculoskeletal: Patient has left hip pain.  Skin: Negative for rash, abrasions, lacerations, ecchymosis.    ____________________________________________   PHYSICAL EXAM:  VITAL SIGNS: ED Triage Vitals  Enc Vitals Group      BP 01/06/21 1526 (!) 143/48     Pulse Rate 01/06/21 1526 62     Resp 01/06/21 1526 18     Temp 01/06/21 1526 98 F (36.7 C)     Temp Source 01/06/21 1526 Oral     SpO2 01/06/21 1526 100 %     Weight 01/06/21 1528 257 lb 15 oz (117 kg)     Height 01/06/21 1528 _0  (1.676 m)     Head Circumference --      [facility] --      Pain Score 01/06/21 1526 5     Pain Loc --      Pain Edu? --      Excl. in [place]? --      Constitutional: Alert and oriented. Well appearing and in no acute distress. Eyes: Conjunctivae are normal. PERRL. EOMI. Head: Atraumatic. ENT:      Nose: No congestion/rhinnorhea.      Mouth/Throat: Mucous membranes are moist.  Neck: No stridor. FROM.  Cardiovascular: Normal rate, regular rhythm. Normal S1 and S2.  Good peripheral  circulation. Respiratory: Normal respiratory effort without tachypnea or retractions. Lungs CTAB. Good air entry to the bases with no decreased or absent breath sounds Gastrointestinal: Bowel sounds x 4 quadrants. Soft and nontender to palpation. No guarding or rigidity. No distention. Musculoskeletal: Full range of motion to all extremities. No obvious deformities noted.  Patient has no pain with internal and external rotation at the left hip. Palpable dorsalis pedis pulse, left. Capillary refill less than 2 seconds on the left.  Neurologic:  Normal for age. No gross focal neurologic deficits are appreciated.  Skin:  Skin is warm, dry and intact. No rash noted. Psychiatric: Mood and affect are normal for age. Speech and behavior are normal.   ____________________________________________   LABS (all labs ordered are listed, but only abnormal results are displayed)  Labs Reviewed  CBC WITH DIFFERENTIAL/PLATELET - Abnormal; Notable for the following components:      Result Value   Hemoglobin 10.9 (*)    HCT 35.4 (*)    RDW 16.9 (*)    All other components within normal limits  COMPREHENSIVE METABOLIC PANEL - Abnormal; Notable for the  following components:   Glucose, Bld 114 (*)    BUN 36 (*)    Creatinine, Ser 1.25 (*)    GFR, Estimated 45 (*)    All other components within normal limits  RESP PANEL BY RT-PCR (FLU A&B, COVID) [id number]   ____________________________________________  EKG   ____________________________________________  RADIOLOGY Unk Pinto, personally viewed and evaluated these images (plain radiographs) as part of my medical decision making, as well as reviewing the written report by the radiologist.    DG Chest 2 View  Result Date: 01/06/2021 CLINICAL DATA:  Recent fall with chest pain, initial encounter EXAM: CHEST - 2 VIEW COMPARISON:  08/31/2019 FINDINGS: Cardiac shadow is enlarged but stable. CardioMEMS device is again noted and stable. Aortic calcifications are again seen. Lungs are well aerated bilaterally. No focal infiltrate or sizable effusion is seen. Vascular congestion has improved in the interval. No acute bony abnormality is noted. IMPRESSION: Resolution of previously seen congestive failure. No acute abnormality noted. Electronically Signed   By: Inez Catalina M.D.   On: 01/06/2021 16:18   CT Head Wo Contrast  Result Date: 01/06/2021 CLINICAL DATA:  Recent fall, initial encounter EXAM: CT HEAD WITHOUT CONTRAST CT CERVICAL SPINE WITHOUT CONTRAST TECHNIQUE: Multidetector CT imaging of the head and cervical spine was performed following the standard protocol without intravenous contrast. Multiplanar CT image reconstructions of the cervical spine were also generated. COMPARISON:  12/03/2020 FINDINGS: CT HEAD FINDINGS Brain: No evidence of acute infarction, hemorrhage, hydrocephalus, extra-axial collection or mass lesion/mass effect. Atrophic and chronic Kawasaki matter ischemic changes are noted. Vascular: No hyperdense vessel or unexpected calcification. Skull: Normal. Negative for fracture or focal lesion. Sinuses/Orbits: No acute finding. Other: None. CT CERVICAL SPINE FINDINGS Alignment:  Mild degenerative anterolisthesis of C3 on C4 is noted. No other alignment abnormality is seen. Skull base and vertebrae: 7 cervical segments are well visualized. Vertebral body height is well maintained. Disc space narrowing is noted most prominent at C5-6 and C6-7 with associated osteophytic changes. Facet hypertrophic changes are noted. No acute fracture or acute facet abnormality is noted. Soft tissues and spinal canal: Surrounding soft tissue structures show vascular calcification. No other focal abnormality is noted. Upper chest: Visualized lung apices are within normal limits. Other: None IMPRESSION: CT of the head: Chronic atrophic and ischemic changes without acute abnormality. CT of the cervical spine: Multilevel degenerative  change without acute abnormality. Electronically Signed   By: Inez Catalina M.D.   On: 01/06/2021 16:21   CT Cervical Spine Wo Contrast  Result Date: 01/06/2021 CLINICAL DATA:  Recent fall, initial encounter EXAM: CT HEAD WITHOUT CONTRAST CT CERVICAL SPINE WITHOUT CONTRAST TECHNIQUE: Multidetector CT imaging of the head and cervical spine was performed following the standard protocol without intravenous contrast. Multiplanar CT image reconstructions of the cervical spine were also generated. COMPARISON:  12/03/2020 FINDINGS: CT HEAD FINDINGS Brain: No evidence of acute infarction, hemorrhage, hydrocephalus, extra-axial collection or mass lesion/mass effect. Atrophic and chronic [place] matter ischemic changes are noted. Vascular: No hyperdense vessel or unexpected calcification. Skull: Normal. Negative for fracture or focal lesion. Sinuses/Orbits: No acute finding. Other: None. CT CERVICAL SPINE FINDINGS Alignment: Mild degenerative anterolisthesis of C3 on C4 is noted. No other alignment abnormality is seen. Skull base and vertebrae: 7 cervical segments are well visualized. Vertebral body height is well maintained. Disc space narrowing is noted most prominent at C5-6 and C6-7 with  associated osteophytic changes. Facet hypertrophic changes are noted. No acute fracture or acute facet abnormality is noted. Soft tissues and spinal canal: Surrounding soft tissue structures show vascular calcification. No other focal abnormality is noted. Upper chest: Visualized lung apices are within normal limits. Other: None IMPRESSION: CT of the head: Chronic atrophic and ischemic changes without acute abnormality. CT of the cervical spine: Multilevel degenerative change without acute abnormality. Electronically Signed   By: Inez Catalina M.D.   On: 01/06/2021 16:21   CT Hip Left Wo Contrast  Result Date: 01/06/2021 CLINICAL DATA:  Status post trauma. EXAM: CT OF THE LEFT HIP WITHOUT CONTRAST TECHNIQUE: Multidetector CT imaging of the left hip was performed according to the standard protocol. Multiplanar CT image reconstructions were also generated. COMPARISON:  Left hip plain film, dated Jan 06, 2021. FINDINGS: Bones/Joint/Cartilage There is no evidence of acute fracture or dislocation. 4 mm and 5 mm chronic appearing cortical densities are seen adjacent to the anteromedial aspect of the proximal left femoral shaft, just below the lesser trochanter. This corresponds to findings noted within this region on the prior plain film evaluation. No lytic or blastic lesions are identified. Ligaments Suboptimally assessed by CT. Muscles and Tendons Unremarkable. Soft tissues Unremarkable. IMPRESSION: No acute osseous abnormality. Electronically Signed   By: Virgina Norfolk M.D.   On: 01/06/2021 17:46   DG Hip Unilat W or Wo Pelvis 2-3 Views Left  Result Date: 01/06/2021 CLINICAL DATA:  Unwitnessed fall.  Left hip pain EXAM: DG HIP (WITH OR WITHOUT PELVIS) 2-3V LEFT COMPARISON:  None. FINDINGS: There is no evidence of hip fracture or dislocation. Joint spaces preserved. Enthesopathic changes at the lesser trochanter. Degenerative disc disease within the visualized lower lumbar spine. IMPRESSION: Negative.  Electronically Signed   By: Davina Poke D.O.   On: 01/06/2021 16:20    ____________________________________________    PROCEDURES  Procedure(s) performed:     Procedures     Medications - No data to display   ____________________________________________   INITIAL IMPRESSION / ASSESSMENT AND PLAN / ED COURSE  Pertinent labs & imaging results that were available during my care of the patient were reviewed by me and considered in my medical decision making (see chart for details).      Assessment and Plan:  Fall:  75 year old female presents to the emergency department primarily complaining of left hip pain after a fall.  Vital signs are reassuring at triage.  On physical exam, patient was alert  and nontoxic-appearing.  She did have some tenderness to palpation along the lateral aspect of the left hip.  Patient had no bony abnormality visualized on x-ray of the left hip or CT of the left hip.  CTs of the head and cervical spine showed no evidence of intracranial bleed, skull fracture or cervical spine fracture.  No evidence of pneumothorax on chest x-ray.  CBC and CMP were reassuring.  Recommended Tylenol at home for discomfort.  ____________________________________________  FINAL CLINICAL IMPRESSION(S) / ED DIAGNOSES  Final diagnoses:  Fall, initial encounter      NEW MEDICATIONS STARTED DURING THIS VISIT:  ED Discharge Orders    None          This chart was dictated using voice recognition software/Dragon. Despite best efforts to proofread, errors can occur which can change the meaning. Any change was purely unintentional.     Karren Cobble 01/06/21 2128    Nance Pear, MD 01/07/21 [phone]

## 2021-01-06 NOTE — ED Notes (Signed)
Report given to Kristen at [facility]

## 2021-01-06 NOTE — ED Triage Notes (Signed)
Pt from [facility]  unwitnessed Accidental fall , Per EMS pt was found on the ground by staff. Pt complaining of left hip pain.   EMS gave 100 mcg of Fentanyl

## 2021-01-06 NOTE — Discharge Instructions (Addendum)
You can take Tylenol for pain. 

## 2021-01-06 NOTE — ED Notes (Signed)
Patient transported to X-ray 

## 2021-01-06 NOTE — ED Notes (Signed)
Patient transported to CT 

## 2021-01-13 ENCOUNTER — Ambulatory Visit: Payer: Medicare Other | Admitting: Occupational Therapy

## 2021-01-21 ENCOUNTER — Ambulatory Visit: Payer: Medicare Other | Admitting: Occupational Therapy

## 2021-01-21 ENCOUNTER — Other Ambulatory Visit: Payer: Self-pay

## 2021-01-21 DIAGNOSIS — I890 Lymphedema, not elsewhere classified: Secondary | ICD-10-CM

## 2021-01-21 NOTE — Therapy (Signed)
[place] MAIN [facility] SERVICES [address] [place], [place], [address] Phone: [phone]   Fax:  [phone]  Occupational Therapy Treatment  Patient Details  Name: [name] MRN: [id number] Date of Birth: 23-Aug-1946 Referring Provider (OT): Juluis Pitch, MD   Encounter Date: 01/21/2021   OT End of Session - 01/21/21 1221    Visit Number 94    Number of Visits 108    Date for OT Re-Evaluation 02/02/21    OT Start Time 0805    OT Stop Time 0905    OT Time Calculation (min) 60 min    Equipment Utilized During Treatment friction gloves and tyvek slipper    Activity Tolerance Patient tolerated treatment well;No increased pain;Other (comment);Treatment limited secondary to medical complications (Comment)           Past Medical History:  Diagnosis Date  . Anemia   . Anxiety   . Arthritis    "hands, feet" (03/03/2017)  . Brain lesion    2 types  . Cataract   . Cervical spondylosis with myelopathy   . Chest pain    Normal cardiac cath 5/09  . Chronic lower back pain   . CKD (chronic kidney disease), stage III ([place])    [name]  . Congestive heart failure (CHF) ([place])    has a Cardiomems implant   . Constipation   . Dementia ([place])   . Depression   . Dumping syndrome   . Dyspnea    with activity  . Edema   . Fatigue   . Fibromyalgia   . GERD (gastroesophageal reflux disease)   . Headache    "w/high CBG" (03/03/2017)  . History of echocardiogram    a. Echo 03/20/16 (done at [facility] in [place], [place]):  mild LVH, EF 72%, normal diastolic function, mild LAE, MAC, RVSP 25 mmHg  . Hyperlipidemia   . Hypertension   . Hypothyroidism   . Lumbar spondylosis   . Lymphedema    seeing specialist for this  . Migraine    "mostly stopped when I changed my diet" (03/03/2017)  . OSA on CPAP   . Pneumonia X 1  . PONV (postoperative nausea and vomiting)   . Restless leg syndrome   . Rheumatoid arthritis ([place])    . Stroke [facility])    TIAs "mini strokes"- unsure of last TIA - [name] and daughter deny this  . Syncope   . Tremors of nervous system   . Type II diabetes mellitus ([place])     Past Surgical History:  Procedure Laterality Date  . APPENDECTOMY  1966  . BACK SURGERY    . CARDIAC CATHETERIZATION N/A 05/25/2016   Procedure: Right/Left Heart Cath and Coronary Angiography;  Surgeon: Larey Dresser, MD;  Location: [place] CV LAB;  Service: Cardiovascular;  Laterality: N/A;  . CATARACT EXTRACTION W/ INTRAOCULAR LENS  IMPLANT, BILATERAL Bilateral   . COLONOSCOPY    . CORONARY ANGIOGRAM  2009   Normal coronaries  . EXCISION/RELEASE BURSA HIP Bilateral   . I & D KNEE WITH POLY EXCHANGE Left 03/02/2017   Procedure: Left knee Revision of poly liner;  Surgeon: Mcarthur Rossetti, MD;  Location: [place];  Service: Orthopedics;  Laterality: Left;  . JOINT REPLACEMENT    . KNEE ARTHROSCOPY Bilateral   . LAPAROSCOPIC CHOLECYSTECTOMY  2015  . LMF  2017   CardiMEMS HF implant for CHF (measures amount of fluid in the heart)  . LUMBAR  [place] SURGERY    . LUMBAR LAMINECTOMY/DECOMPRESSION MICRODISCECTOMY Left 12/2005   L2-3 laminectomy and diskectomy/notes 01/06/2011  . POSTERIOR LUMBAR FUSION  04/2006   [name] 01/06/2011; "put cages in"  . REVERSE SHOULDER ARTHROPLASTY Left 06/15/2017   Procedure: REVERSE LEFT SHOULDER ARTHROPLASTY;  Surgeon: Meredith Pel, MD;  Location: [place];  Service: Orthopedics;  Laterality: Left;  . REVERSE SHOULDER ARTHROPLASTY Right 02/01/2018  . REVERSE SHOULDER ARTHROPLASTY Right 02/01/2018   Procedure: RIGHT REVERSE SHOULDER ARTHROPLASTY;  Surgeon: Meredith Pel, MD;  Location: [place];  Service: Orthopedics;  Laterality: Right;  . REVISION TOTAL KNEE ARTHROPLASTY Left 03/02/2017   poly liner/notes 03/02/2017  . RIGHT HEART CATH N/A 11/13/2016   Procedure: Right Heart Cath with Cardiomems;  Surgeon: Larey Dresser, MD;  Location: [place] CV LAB;  Service:  Cardiovascular;  Laterality: N/A;  . SHOULDER ARTHROSCOPY WITH ROTATOR CUFF REPAIR Right   . SHOULDER OPEN ROTATOR CUFF REPAIR Left 01/2011   [name] 01/29/2011  . TOTAL ABDOMINAL HYSTERECTOMY    . TOTAL KNEE ARTHROPLASTY Left   . TOTAL KNEE ARTHROPLASTY Right 11/18/2012   Procedure: TOTAL KNEE ARTHROPLASTY;  Surgeon: Yvette Rack., MD;  Location: [place];  Service: Orthopedics;  Laterality: Right;  . TUBAL LIGATION    . UPPER GASTROINTESTINAL ENDOSCOPY    . UPPER GI ENDOSCOPY      There were no vitals filed for this visit.   Subjective Assessment - 01/21/21 1230    Subjective  [name] presents to OT for Rx visit 94/108  to address BLE lipo-lymphedema (I89.0).[name] was last seen on 12/30/20 for lymphedema care. Since that time she has had 2-3  falls, once after her visit with me in the restroom, and 1-2 at theSNF where she resides. [name] is tearful and  anxious for first   30 minutes of 60 minute session, but she relaxed after settling and with some deep br4eathing. CNA from SNF accompanied her to clinic, but waited in reception by [name] request after she completed her transfer to Rx bed,    Pertinent History chronic BLE leg swelling and associated pain ~ 40 years; hx herniated intervertebral discL TKA; L total knee revision; HTN, Obesity. dementia; restless leg; CHF; unstable anina; R RC arthroplasty, OA, cervical spondylosis with myelopathy, chronic lower back pain, CKD (Stage?); Fibromyalgia; Hypothyroidism; OSA ( cPap?); hx pneumonia; RA, Type II Diabetes. S/P cardiac cathg, B THA, Lumbar laminectomy, posterior lumbar fusion, B shoulder arthroplasty; total abdominal hysterectomy    Limitations chronic leg swelling and associated pain, chronic OA pain, difficulty walking, impaired balance, muscle weakness, decreased knee hip and shoulder AROM, decreased standing tolerance    Special Tests - STEMMER sign base of toes bilaterally                                OT Education -  01/21/21 1233    Education Details Continued skilled [name]/caregiver education  And LE ADL training throughout visit for lymphedema self care/ home program, including compression wrapping, compression garment and device wear/care, lymphatic pumping ther ex, simple self-MLD, and skin care. Discussed progress towards goals and OT POC going forward.    Person(s) Educated Patient    Methods Explanation;Demonstration    Comprehension Verbalized understanding;Returned demonstration;Need further instruction               OT Long Term Goals - 12/09/20 0815      OT LONG TERM  GOAL #1   Title [name] [name] be able to apply LLE multi-layer, short stretch compression wraps daily with maximum caregiver assistance, using correct gradient techniques to return affected limb/s, as closely as possible, to premorbid size and shape, to limit infection risk, and to improve safe functional mobility and ADLs performance.    Baseline dependent    Time 4    Period Days    Status Achieved   DC goal. MET     OT LONG TERM GOAL #2   Title [name] [name] be able to verbalize signs and symptoms of cellulitis infection and identify 4 common lymphedema precautions using printed resource for reference (modified independence) to limit LE progression over time.    Baseline Max A    Time 4    Period Days    Status Achieved   MET- DC Goal     OT LONG TERM GOAL #3   Title With Max CG assistance [name] [name] achieve %, or greater compliance with all daily LE self-care home program components throughout Intensive Phase CDT, including impeccable skin care, lymphatic pumping therex,  compression wraps;/ garments and devices and simple self MLD, or Flexitouch device daily to ensure optimal limb volume reduction, to limit infection risk and to limit LE progression.   [name] willing, but Inconsistent and inadequate SNF staffing are greatest obstacles to achieving/ sustainging 85% compliance. At present [name] reports ~50% compliance with LE self-care. Goal  modified to 60% compliance with Max A for next reporting period.   Baseline dependent    Time 12    Period Weeks    Status Revised    Target Date 02/02/21      OT LONG TERM GOAL #4   Title [name] to achieve at least 10% LLE limb volume reduction during Intensive Phase CDT with Max CG assistance for wrapping, to improve functional performance of basic and instrumental ADLs, and to limit LE progression.   to date LLE reductions: A-D= 15.7%, E-G= 29.63%, A-G= 22.9%. RLE: A-D: 6.7%, E-G= 15.7%, A-G= 11.2%   Baseline dependent    Time 12    Period Weeks    Status On-going   Limb volume continues to fluctuate, somcetimes dramatically. Continue regular OT for CDT in effort to stabilize  within goal range, for at least 6 consecutive months, to limit infection risk, LE progression and further functional decline.   Target Date 02/02/21      OT LONG TERM GOAL #5   Title During self-management phase of CDT [name] [name] retain limb volume reductions achieved during Intensive Phase CDT with no more than 3% volume increase using all LE self care components  to limit LE progression and further functional decline.    Baseline dependent    Time 6    Period Months    Status Not Met   DC GOAL. NOT MET     OT LONG TERM GOAL #6   Title [name] [name] increase compliance w/ LE self care home program by increasing skin care frequency from 1 x weekly to 3 x weekly  to limit LE progression and reduce infection risk.    Baseline Mod A    Time 12    Period Weeks    Status Revised    Target Date 02/02/21      OT LONG TERM GOAL #7   Title [name] [name] increase leg elevation during regular seated activities and routines by at least 30 minutes per day to limit LE progression and to better control swelling.Marland Kitchen  goal modified 12/09/20. Decreased time from 2 hrs to 30 minutes in effort to make goal more realistic and achievable   Baseline Mod A    Time 12    Period Weeks    Status Revised   [name] reports no progress . [name] reports she has leg  rest hangers, but then isn't able to get to the bathroom quickly enough   Target Date 02/02/21                 Plan - 01/21/21 1222    Clinical Impression Statement Limb volume appears well managed today despite missing OT for past  3 weeks due to multiple falls. Skin is well hydrated . [name] reports she elevates her legs every day after 4 PM, and reports recent weight loss of ~40#, which no doubt, has had apositive impact on lymphatic funtion. [name] tearful off and on during session, often wringing her hands. She was able to perform diaphragmantic breathing with ongoing cues , which assisted  not only with deep abdominal stimulation of the thoracic duct, but also aided relaxation. May need to remeasure for compression garments as it is unlikely that garment measurements made 3 weeks ago [name] fit  if weight loss continues and if delivery continues to be delayed, as has been typical of late from [place].  Cont 1 x weekly until garments are fitted, then decrease frequency to follow along status if volues remain stable.    OT Occupational Profile and History Comprehensive Assessment- Review of records and extensive additional review of physical, cognitive, psychosocial history related to current functional performance    Occupational performance deficits (Please refer to evaluation for details): ADL's;IADL's;Rest and Sleep;Leisure;Social Participation;Other   body image   Body Structure / Function / Physical Skills ADL;Pain;Balance;Edema;IADL;Decreased knowledge of precautions;Skin integrity;Decreased knowledge of use of DME;ROM    Rehab Potential Good    Clinical Decision Making Multiple treatment options, significant modification of task necessary    Comorbidities Affecting Occupational Performance: Presence of comorbidities impacting occupational performance    Modification or Assistance to Complete Evaluation  Min-Moderate modification of tasks or assist with assess necessary to complete eval    OT  Frequency 1x / week    OT Duration 12 weeks    OT Treatment/Interventions Self-care/ADL training;DME and/or AE instruction;Manual lymph drainage;Compression bandaging;Therapeutic activities;Coping strategies training;Therapeutic exercise;Manual Therapy;Patient/family education;Other (comment)   tissue fibrosis massage; skin care to reduce tissue sensativity/ pain and increase hydration to limit infection risk and LE progression   Plan Complete Deconhgestive Therapy (CDT) Intensive and Self -Management phases, consisting of Manual lymphatic Drainage (MLD), skin care, ther ex and compression bandaging    OT Home Exercise Plan lymphatic pumping therex 2-3 x q day, in sequence, 10 reps bilaterally    Recommended Other Services Daytime: 2 Pr. Jobst EVAREX custom, ccl 2, A-D, 2.5 cm SB at oblique top edge, open toe, t heel.  HOS: Jobst RELAX custom ccl 2 A-D, OT w. posterior zipper    Consulted and Agree with Plan of Care Patient           Patient [name] benefit from skilled therapeutic intervention in order to improve the following deficits and impairments:   Body Structure / Function / Physical Skills: ADL,Pain,Balance,Edema,IADL,Decreased knowledge of precautions,Skin integrity,Decreased knowledge of use of DME,ROM       Visit Diagnosis: Lymphedema, not elsewhere classified    Problem List Patient Active Problem List   Diagnosis Date Noted  . Iron deficiency 08/06/2018  . History of total  left knee replacement (TKR) 07/18/2018  . Rectal bleeding 07/05/2018  . Hemorrhoids 07/05/2018  . Anal itching 07/05/2018  . Rotator cuff arthropathy of right shoulder   . Shoulder arthritis 06/15/2017  . Status post revision of total knee replacement, left 04/29/2017  . Polyethylene liner wear following left total knee arthroplasty requiring isolated polyethylene liner exchange ([place]) 03/02/2017  . 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 Unstable angina ([place]) 05/21/2016  .  Chronic diastolic heart failure ([place]) 03/29/2016  . Normal coronary arteries 2009 01/14/2015  . Obesity-BMI 40 01/14/2015  . Restless leg 01/14/2015  . Chest pain 01/14/2015  . Back pain 01/14/2015  . Renal insufficiency 01/14/2015  . Dementia- mild memory issues 01/14/2015  . Occult blood positive stool 12/14/2014  . Anemia 12/14/2014  . Family history of colon cancer 12/14/2014  . Change in bowel habits 12/14/2014  . GERD (gastroesophageal reflux disease) 12/14/2014  . Dyspnea 05/17/2012  . Lymphedema 05/17/2012  . Weight gain 05/17/2012  . HTN (hypertension) 05/17/2012    Andrey Spearman, MS, OTR/L, [facility] 01/21/21 12:34 PM  [place] MAIN [facility] SERVICES [address] [place], [place], [address] Phone: [phone]   Fax:  [phone]  Name: [name] MRN: [id number] Date of Birth: [birth year]

## 2021-01-22 ENCOUNTER — Encounter: Payer: Self-pay | Admitting: Physician Assistant

## 2021-01-22 ENCOUNTER — Ambulatory Visit (INDEPENDENT_AMBULATORY_CARE_PROVIDER_SITE_OTHER): Payer: Medicare Other | Admitting: Physician Assistant

## 2021-01-22 ENCOUNTER — Ambulatory Visit: Payer: Self-pay

## 2021-01-22 DIAGNOSIS — M25562 Pain in left knee: Secondary | ICD-10-CM | POA: Diagnosis not present

## 2021-01-22 DIAGNOSIS — G8929 Other chronic pain: Secondary | ICD-10-CM

## 2021-01-22 DIAGNOSIS — M25572 Pain in left ankle and joints of left foot: Secondary | ICD-10-CM

## 2021-01-22 NOTE — Progress Notes (Signed)
Office Visit Note   Patient: [name]           Date of Birth: Jul 03, 1946           MRN: [id number] Visit Date: 01/22/2021              Requested by: Jennette Bill., MD [address] [place],  [place] [address] PCP: Jennette Bill., MD   Assessment & Plan: Visit Diagnoses:  1. Chronic pain of left knee   2. Pain in left ankle and joints of left foot     Plan: Placed in an ASO brace on the left ankle he is to wear this whenever she is up ambulating.  Regards to her left knee we will obtain a bone scan to rule out loosening of the left total knee arthroplasty components.  Have her follow-up with Dr. Ninfa Linden after the bone scan to go over the results and discuss further treatment.  Questions were encouraged and answered by Dr. Ninfa Linden and myself.  Follow-Up Instructions: No follow-ups on file.   Orders:  Orders Placed This Encounter  Procedures  . XR Knee 1-2 Views Left  . XR Ankle Complete Left   No orders of the defined types were placed in this encounter.     Procedures: No procedures performed   Clinical Data: No additional findings.   Subjective: Chief Complaint  Patient presents with  . Left Knee - Pain  . Left Ankle - Pain    HPI [name] is well-known to Dr. Ulla Potash service comes in today due to left knee pain and left ankle pain.  She has had 7 falls within 6 weeks.  She reports that her left knee gives out on her.  She is now having some left ankle pain since her falls.  She notes the ankle feels weak.  She notes that physical therapy is helping with her overall strength involving her ankle and knee but not with the pain.  She spends a great deal of time in bed due to the fact that she cannot ambulate without assistance.  Has a history of a left total knee arthroplasty 11/13/2012 by another physician here in town.  She underwent polyexchange with upsizing polyliner in 2018 by Dr. Ninfa Linden.  She has known subluxation of the left patella.  She  currently resides at her nursing facility.  Currently is taking gabapentin and Tylenol for pain. Review of Systems See HPI otherwise negative  Objective: Vital Signs: There were no vitals taken for this visit.  Physical Exam Constitutional:      Appearance: She is not ill-appearing or diaphoretic.  Pulmonary:     Effort: Pulmonary effort is normal.  Neurological:     Mental Status: She is alert and oriented to person, place, and time.  Psychiatric:        Mood and Affect: Mood normal.     Ortho Exam Left ankle: Significant soft tissue envelope.  There is no ecchymosis erythema.  She is able to dorsiflex plantarflex ankle.  She has weakness with eversion good inversion.  Tenderness over the left lateral malleolus.  Also tenderness over the anterior talofibular ligament region.  Nontender over the Achilles. Left knee limited range of motion of the knee actively passively and bring in full extension and flexion to about 90 degrees.  With range of motion there is subluxation of the patella laterally.  No gross instability valgus varus stressing left knee.  Specialty Comments:  No specialty comments available.  Imaging: XR Ankle Complete Left  Result Date: 01/22/2021 Left ankle 3 views: No acute fractures.  Talus well located within the ankle mortise.  Heterotopic bone distal medial malleolus consistent with old ankle injury.  Otherwise ankle joint is well-preserved.  XR Knee 1-2 Views Left  Result Date: 01/22/2021 Left knee 2 views: No acute fractures.  Patella subluxed laterally.  Lucency around the tibial tray may represent loosening of the tibial tray.  Femoral component appears well-seated.  Otherwise knee is well located.    PMFS History: Patient Active Problem List   Diagnosis Date Noted  . Iron deficiency 08/06/2018  . History of total left knee replacement (TKR) 07/18/2018  . Rectal bleeding 07/05/2018  . Hemorrhoids 07/05/2018  . Anal itching 07/05/2018  . Rotator cuff  arthropathy of right shoulder   . Shoulder arthritis 06/15/2017  . Status post revision of total knee replacement, left 04/29/2017  . Polyethylene liner wear following left total knee arthroplasty requiring isolated polyethylene liner exchange ([place]) 03/02/2017  . 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 Unstable angina ([place]) 05/21/2016  . Chronic diastolic heart failure ([place]) 03/29/2016  . Normal coronary arteries 2009 01/14/2015  . Obesity-BMI 40 01/14/2015  . Restless leg 01/14/2015  . Chest pain 01/14/2015  . Back pain 01/14/2015  . Renal insufficiency 01/14/2015  . Dementia- mild memory issues 01/14/2015  . Occult blood positive stool 12/14/2014  . Anemia 12/14/2014  . Family history of colon cancer 12/14/2014  . Change in bowel habits 12/14/2014  . GERD (gastroesophageal reflux disease) 12/14/2014  . Dyspnea 05/17/2012  . Lymphedema 05/17/2012  . Weight gain 05/17/2012  . HTN (hypertension) 05/17/2012   Past Medical History:  Diagnosis Date  . Anemia   . Anxiety   . Arthritis    "hands, feet" (03/03/2017)  . Brain lesion    2 types  . Cataract   . Cervical spondylosis with myelopathy   . Chest pain    Normal cardiac cath 5/09  . Chronic lower back pain   . CKD (chronic kidney disease), stage III ([place])    [name]  . Congestive heart failure (CHF) ([place])    has a Cardiomems implant   . Constipation   . Dementia ([place])   . Depression   . Dumping syndrome   . Dyspnea    with activity  . Edema   . Fatigue   . Fibromyalgia   . GERD (gastroesophageal reflux disease)   . Headache    "w/high CBG" (03/03/2017)  . History of echocardiogram    a. Echo 03/20/16 (done at [facility] in [place], [place]):  mild LVH, EF 52%, normal diastolic function, mild LAE, MAC, RVSP 25 mmHg  . Hyperlipidemia   . Hypertension   . Hypothyroidism   . Lumbar spondylosis   . Lymphedema    seeing specialist for this  . Migraine    "mostly  stopped when I changed my diet" (03/03/2017)  . OSA on CPAP   . Pneumonia X 1  . PONV (postoperative nausea and vomiting)   . Restless leg syndrome   . Rheumatoid arthritis ([place])   . Stroke [facility])    TIAs "mini strokes"- unsure of last TIA - pt and daughter deny this  . Syncope   . Tremors of nervous system   . Type II diabetes mellitus ([facility])     Family History  Problem Relation Age of Onset  . Heart disease Mother   . Heart  failure Mother   . Kidney disease Mother   . Lung cancer Father   . Colon cancer Brother   . Cancer Brother        malignant thymoma  . Heart disease Brother   . Tremor Maternal Grandmother 90  . Ovarian cancer Maternal Grandmother   . Uterine cancer Maternal Grandmother   . Irritable bowel syndrome Daughter   . Esophageal cancer Neg Hx   . Stomach cancer Neg Hx   . Inflammatory bowel disease Neg Hx   . Liver disease Neg Hx   . Pancreatic cancer Neg Hx   . Rectal cancer Neg Hx     Past Surgical History:  Procedure Laterality Date  . APPENDECTOMY  1966  . BACK SURGERY    . CARDIAC CATHETERIZATION N/A 05/25/2016   Procedure: Right/Left Heart Cath and Coronary Angiography;  Surgeon: Larey Dresser, MD;  Location: [place] CV LAB;  Service: Cardiovascular;  Laterality: N/A;  . CATARACT EXTRACTION W/ INTRAOCULAR LENS  IMPLANT, BILATERAL Bilateral   . COLONOSCOPY    . CORONARY ANGIOGRAM  2009   Normal coronaries  . EXCISION/RELEASE BURSA HIP Bilateral   . I & D KNEE WITH POLY EXCHANGE Left 03/02/2017   Procedure: Left knee Revision of poly liner;  Surgeon: Mcarthur Rossetti, MD;  Location: [place];  Service: Orthopedics;  Laterality: Left;  . JOINT REPLACEMENT    . KNEE ARTHROSCOPY Bilateral   . LAPAROSCOPIC CHOLECYSTECTOMY  2015  . LMF  2017   CardiMEMS HF implant for CHF (measures amount of fluid in the heart)  . LUMBAR DISC SURGERY    . LUMBAR LAMINECTOMY/DECOMPRESSION MICRODISCECTOMY Left 12/2005   L2-3 laminectomy and diskectomy/notes  01/06/2011  . POSTERIOR LUMBAR FUSION  04/2006   [name] 01/06/2011; "put cages in"  . REVERSE SHOULDER ARTHROPLASTY Left 06/15/2017   Procedure: REVERSE LEFT SHOULDER ARTHROPLASTY;  Surgeon: Meredith Pel, MD;  Location: [facility];  Service: Orthopedics;  Laterality: Left;  . REVERSE SHOULDER ARTHROPLASTY Right 02/01/2018  . REVERSE SHOULDER ARTHROPLASTY Right 02/01/2018   Procedure: RIGHT REVERSE SHOULDER ARTHROPLASTY;  Surgeon: Meredith Pel, MD;  Location: [place];  Service: Orthopedics;  Laterality: Right;  . REVISION TOTAL KNEE ARTHROPLASTY Left 03/02/2017   poly liner/notes 03/02/2017  . RIGHT HEART CATH N/A 11/13/2016   Procedure: Right Heart Cath with Cardiomems;  Surgeon: Larey Dresser, MD;  Location: [place] CV LAB;  Service: Cardiovascular;  Laterality: N/A;  . SHOULDER ARTHROSCOPY WITH ROTATOR CUFF REPAIR Right   . SHOULDER OPEN ROTATOR CUFF REPAIR Left 01/2011   [name] 01/29/2011  . TOTAL ABDOMINAL HYSTERECTOMY    . TOTAL KNEE ARTHROPLASTY Left   . TOTAL KNEE ARTHROPLASTY Right 11/18/2012   Procedure: TOTAL KNEE ARTHROPLASTY;  Surgeon: Yvette Rack., MD;  Location: [place];  Service: Orthopedics;  Laterality: Right;  . TUBAL LIGATION    . UPPER GASTROINTESTINAL ENDOSCOPY    . UPPER GI ENDOSCOPY     Social History   Occupational History  . Occupation: retired  Tobacco Use  . Smoking status: Never Smoker  . Smokeless tobacco: Never Used  Vaping Use  . Vaping Use: Never used  Substance and Sexual Activity  . Alcohol use: No    Alcohol/week: 0.0 standard drinks  . Drug use: No  . Sexual activity: Not Currently

## 2021-01-23 ENCOUNTER — Other Ambulatory Visit: Payer: Self-pay

## 2021-01-23 DIAGNOSIS — Z96652 Presence of left artificial knee joint: Secondary | ICD-10-CM

## 2021-01-27 ENCOUNTER — Other Ambulatory Visit: Payer: Self-pay

## 2021-01-27 ENCOUNTER — Ambulatory Visit: Payer: Medicare Other | Attending: Nephrology | Admitting: Occupational Therapy

## 2021-01-27 DIAGNOSIS — I890 Lymphedema, not elsewhere classified: Secondary | ICD-10-CM | POA: Diagnosis present

## 2021-01-27 NOTE — Patient Instructions (Signed)
Lymphedema Self- Care Instructions  1. EXERCISE: Perform lymphatic pumping there ex 2 x a day. While wearing your compression wraps or garments. Perform 10 reps of each exercise bilaterally and be sure to perform them in order. Don;t skip around!  OMIT PARTIAL SIT UP  2. MLD: Perform simple self-Manual Lymphatic Drainage (MLD) at least once a day as directed.  3. WRAPS: Compression wraps are to be worn 23 hrs/ 7 days/wk during Intensive Phase of Complete Decongestive Therapy (CDT).Building tolerance may take time and practice, so don't get discouraged. If bandages begin to feel tight during periods of inactivity and/or during the night, try performing your exercises to loosen them.   4. GARMENTS: During Management Phase CDT your compression garments are to be worn during waking hours when active. Do NOT sleep in your garments!!   5. PUT YOUR FEET UP! Elevate your feet and legs and feet to the level of your heart whenever you are sitting down.   6. SKIN: Carefully monitor skin condition and perform impeccable hygiene daily. Bathe skin with mild soap and water and apply low pH lotion (aka Eucerin ) to improve hydration and limit infection risk.

## 2021-01-27 NOTE — Therapy (Signed)
[place] [facility] SERVICES [address] [place], [place], [address] Phone: [phone]   Fax:  [phone]  Occupational Therapy Treatment  Patient Details  Name: [name] MRN: [id number] Date of Birth: [birth year] Referring Provider (OT): [name] Pitch, MD   Encounter Date: 01/27/2021   OT End of Session - 01/27/21 0811    Visit Number 95    Number of Visits 108    Date for OT Re-Evaluation 02/02/21    OT Start Time 0800    OT Stop Time 0910    OT Time Calculation (min) 70 min    Equipment Utilized During Treatment friction gloves and tyvek slipper    Activity Tolerance Patient tolerated treatment well;No increased pain;Other (comment);Treatment limited secondary to medical complications (Comment)           Past Medical History:  Diagnosis Date  . Anemia   . Anxiety   . Arthritis    "hands, feet" (03/03/2017)  . Brain lesion    2 types  . Cataract   . Cervical spondylosis with myelopathy   . Chest pain    Normal cardiac cath 5/09  . Chronic lower back pain   . CKD (chronic kidney disease), stage III ([place])    [name]  . Congestive heart failure (CHF) ([place])    has a Cardiomems implant   . Constipation   . Dementia ([place])   . Depression   . Dumping syndrome   . Dyspnea    with activity  . Edema   . Fatigue   . Fibromyalgia   . GERD (gastroesophageal reflux disease)   . Headache    "w/high CBG" (03/03/2017)  . History of echocardiogram    a. Echo 03/20/16 (done at [facility] in [place], [place]):  mild LVH, EF 54%, normal diastolic function, mild LAE, MAC, RVSP 25 mmHg  . Hyperlipidemia   . Hypertension   . Hypothyroidism   . Lumbar spondylosis   . Lymphedema    seeing specialist for this  . Migraine    "mostly stopped when I changed my diet" (03/03/2017)  . OSA on CPAP   . Pneumonia X 1  . PONV (postoperative nausea and vomiting)   . Restless leg syndrome   . Rheumatoid arthritis ([place])    . Stroke [facility])    TIAs "mini strokes"- unsure of last TIA - pt and daughter deny this  . Syncope   . Tremors of nervous system   . Type II diabetes mellitus ([place])     Past Surgical History:  Procedure Laterality Date  . APPENDECTOMY  1966  . BACK SURGERY    . CARDIAC CATHETERIZATION N/A 05/25/2016   Procedure: Right/Left Heart Cath and Coronary Angiography;  Surgeon: Larey Dresser, MD;  Location: [place] CV LAB;  Service: Cardiovascular;  Laterality: N/A;  . CATARACT EXTRACTION W/ INTRAOCULAR LENS  IMPLANT, BILATERAL Bilateral   . COLONOSCOPY    . CORONARY ANGIOGRAM  2009   Normal coronaries  . EXCISION/RELEASE BURSA HIP Bilateral   . I & D KNEE WITH POLY EXCHANGE Left 03/02/2017   Procedure: Left knee Revision of poly liner;  Surgeon: Mcarthur Rossetti, MD;  Location: [place];  Service: Orthopedics;  Laterality: Left;  . JOINT REPLACEMENT    . KNEE ARTHROSCOPY Bilateral   . LAPAROSCOPIC CHOLECYSTECTOMY  2015  . LMF  2017   CardiMEMS HF implant for CHF (measures amount of fluid in the heart)  . LUMBAR  [place] SURGERY    . LUMBAR LAMINECTOMY/DECOMPRESSION MICRODISCECTOMY Left 12/2005   L2-3 laminectomy and diskectomy/notes 01/06/2011  . POSTERIOR LUMBAR FUSION  04/2006   [name] 01/06/2011; "put cages in"  . REVERSE SHOULDER ARTHROPLASTY Left 06/15/2017   Procedure: REVERSE LEFT SHOULDER ARTHROPLASTY;  Surgeon: Meredith Pel, MD;  Location: [place];  Service: Orthopedics;  Laterality: Left;  . REVERSE SHOULDER ARTHROPLASTY Right 02/01/2018  . REVERSE SHOULDER ARTHROPLASTY Right 02/01/2018   Procedure: RIGHT REVERSE SHOULDER ARTHROPLASTY;  Surgeon: Meredith Pel, MD;  Location: [place];  Service: Orthopedics;  Laterality: Right;  . REVISION TOTAL KNEE ARTHROPLASTY Left 03/02/2017   poly liner/notes 03/02/2017  . RIGHT HEART CATH N/A 11/13/2016   Procedure: Right Heart Cath with Cardiomems;  Surgeon: Larey Dresser, MD;  Location: [facility];  Service:  Cardiovascular;  Laterality: N/A;  . SHOULDER ARTHROSCOPY WITH ROTATOR CUFF REPAIR Right   . SHOULDER OPEN ROTATOR CUFF REPAIR Left 01/2011   [name] 01/29/2011  . TOTAL ABDOMINAL HYSTERECTOMY    . TOTAL KNEE ARTHROPLASTY Left   . TOTAL KNEE ARTHROPLASTY Right 11/18/2012   Procedure: TOTAL KNEE ARTHROPLASTY;  Surgeon: Yvette Rack., MD;  Location: [place] [facility];  Service: Orthopedics;  Laterality: Right;  . TUBAL LIGATION    . UPPER GASTROINTESTINAL ENDOSCOPY    . UPPER GI ENDOSCOPY      There were no vitals filed for this visit.   Subjective Assessment - 01/27/21 2774    Subjective  [name] presents to OT for Rx visit 95/108  to address BLE lipo-lymphedema (I89.0).Pt is accompanied by SNF NA, [name], to assist with transfers and personal care to limit falls risk. Pt denies falls since last visit. Pt reports 6/10 leg pain this morning.    Pertinent History chronic BLE leg swelling and associated pain ~ 40 years; hx herniated intervertebral discL TKA; L total knee revision; HTN, Obesity. dementia; restless leg; CHF; unstable anina; R RC arthroplasty, OA, cervical spondylosis with myelopathy, chronic lower back pain, CKD (Stage?); Fibromyalgia; Hypothyroidism; OSA ( cPap?); hx pneumonia; RA, Type II Diabetes. S/P cardiac cathg, B THA, Lumbar laminectomy, posterior lumbar fusion, B shoulder arthroplasty; total abdominal hysterectomy    Limitations chronic leg swelling and associated pain, chronic OA pain, difficulty walking, impaired balance, muscle weakness, decreased knee hip and shoulder AROM, decreased standing tolerance    Special Tests - STEMMER sign base of toes bilaterally                                OT Education - 01/27/21 1207    Education Details Continued skilled Pt/caregiver education  And LE ADL training throughout visit for lymphedema self care/ home program, including compression wrapping, compression garment and device wear/care, lymphatic pumping ther ex,  simple self-MLD, and skin care. Discussed progress towards goals and OT POC going forward.    Person(s) Educated Patient    Methods Explanation;Demonstration    Comprehension Verbalized understanding;Returned demonstration;Need further instruction               OT Long Term Goals - 12/09/20 0815      OT LONG TERM GOAL #1   Title Pt will be able to apply LLE multi-layer, short stretch compression wraps daily with maximum caregiver assistance, using correct gradient techniques to return affected limb/s, as closely as possible, to premorbid size and shape, to limit infection risk, and to improve safe functional mobility and ADLs performance.  Baseline dependent    Time 4    Period Days    Status Achieved   DC goal. MET     OT LONG TERM GOAL #2   Title Pt will be able to verbalize signs and symptoms of cellulitis infection and identify 4 common lymphedema precautions using printed resource for reference (modified independence) to limit LE progression over time.    Baseline Max A    Time 4    Period Days    Status Achieved   MET- DC Goal     OT LONG TERM GOAL #3   Title With Max CG assistance Pt will achieve %, or greater compliance with all daily LE self-care home program components throughout Intensive Phase CDT, including impeccable skin care, lymphatic pumping therex,  compression wraps;/ garments and devices and simple self MLD, or Flexitouch device daily to ensure optimal limb volume reduction, to limit infection risk and to limit LE progression.   Pt willing, but Inconsistent and inadequate SNF staffing are greatest obstacles to achieving/ sustainging 85% compliance. At present Pt reports ~50% compliance with LE self-care. Goal modified to 60% compliance with Max A for next reporting period.   Baseline dependent    Time 12    Period Weeks    Status Revised    Target Date 02/02/21      OT LONG TERM GOAL #4   Title Pt to achieve at least 10% LLE limb volume reduction during  Intensive Phase CDT with Max CG assistance for wrapping, to improve functional performance of basic and instrumental ADLs, and to limit LE progression.   to date LLE reductions: A-D= 15.7%, E-G= 29.63%, A-G= 22.9%. RLE: A-D: 6.7%, E-G= 15.7%, A-G= 11.2%   Baseline dependent    Time 12    Period Weeks    Status On-going   Limb volume continues to fluctuate, somcetimes dramatically. Continue regular OT for CDT in effort to stabilize  within goal range, for at least 6 consecutive months, to limit infection risk, LE progression and further functional decline.   Target Date 02/02/21      OT LONG TERM GOAL #5   Title During self-management phase of CDT Pt will retain limb volume reductions achieved during Intensive Phase CDT with no more than 3% volume increase using all LE self care components  to limit LE progression and further functional decline.    Baseline dependent    Time 6    Period Months    Status Not Met   DC GOAL. NOT MET     OT LONG TERM GOAL #6   Title Pt will increase compliance w/ LE self care home program by increasing skin care frequency from 1 x weekly to 3 x weekly  to limit LE progression and reduce infection risk.    Baseline Mod A    Time 12    Period Weeks    Status Revised    Target Date 02/02/21      OT LONG TERM GOAL #7   Title Pt will increase leg elevation during regular seated activities and routines by at least 30 minutes per day to limit LE progression and to better control swelling.Marland Kitchen   goal modified 12/09/20. Decreased time from 2 hrs to 30 minutes in effort to make goal more realistic and achievable   Baseline Mod A    Time 12    Period Weeks    Status Revised   pt reports no progress . pt reports she has leg rest hangers,  but then isn't able to get to the bathroom quickly enough   Target Date 02/02/21                 Plan - 01/27/21 1202    Clinical Impression Statement Continued CDT with LLE MLD, skin care and compression to reduce limb volume,  limit infection risk and limit lymphatic reaccumulation after Rx. Pt tolerated all aspects without pain. Swelling isappears  well managed between visits. Not sure if diuretic meds have changed, but systemic fluid retention is also reduced over past 2-3 weeks, reducing overload on lymphatic system. Plan to continue CDT 1 x weekly until compression garments are fit, then we'll discuss frequency going forward.    OT Occupational Profile and History Comprehensive Assessment- Review of records and extensive additional review of physical, cognitive, psychosocial history related to current functional performance    Occupational performance deficits (Please refer to evaluation for details): ADL's;IADL's;Rest and Sleep;Leisure;Social Participation;Other   body image   Body Structure / Function / Physical Skills ADL;Pain;Balance;Edema;IADL;Decreased knowledge of precautions;Skin integrity;Decreased knowledge of use of DME;ROM    Rehab Potential Good    Clinical Decision Making Multiple treatment options, significant modification of task necessary    Comorbidities Affecting Occupational Performance: Presence of comorbidities impacting occupational performance    Modification or Assistance to Complete Evaluation  Min-Moderate modification of tasks or assist with assess necessary to complete eval    OT Frequency 1x / week    OT Duration 12 weeks    OT Treatment/Interventions Self-care/ADL training;DME and/or AE instruction;Manual lymph drainage;Compression bandaging;Therapeutic activities;Coping strategies training;Therapeutic exercise;Manual Therapy;Patient/family education;Other (comment)   tissue fibrosis massage; skin care to reduce tissue sensativity/ pain and increase hydration to limit infection risk and LE progression   Plan Complete Deconhgestive Therapy (CDT) Intensive and Self -Management phases, consisting of Manual lymphatic Drainage (MLD), skin care, ther ex and compression bandaging    OT Home Exercise  Plan lymphatic pumping therex 2-3 x q day, in sequence, 10 reps bilaterally    Recommended Other Services Daytime: 2 Pr. Jobst EVAREX custom, ccl 2, A-D, 2.5 cm SB at oblique top edge, open toe, t heel.  HOS: Jobst RELAX custom ccl 2 A-D, OT w. posterior zipper    Consulted and Agree with Plan of Care Patient           Patient will benefit from skilled therapeutic intervention in order to improve the following deficits and impairments:   Body Structure / Function / Physical Skills: ADL,Pain,Balance,Edema,IADL,Decreased knowledge of precautions,Skin integrity,Decreased knowledge of use of DME,ROM       Visit Diagnosis: Lymphedema, not elsewhere classified    Problem List Patient Active Problem List   Diagnosis Date Noted  . Iron deficiency 08/06/2018  . History of total left knee replacement (TKR) 07/18/2018  . Rectal bleeding 07/05/2018  . Hemorrhoids 07/05/2018  . Anal itching 07/05/2018  . Rotator cuff arthropathy of right shoulder   . Shoulder arthritis 06/15/2017  . Status post revision of total knee replacement, left 04/29/2017  . Polyethylene liner wear following left total knee arthroplasty requiring isolated polyethylene liner exchange ([place]) 03/02/2017  . 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 Unstable angina ([place]) 05/21/2016  . Chronic diastolic heart failure ([place]) 03/29/2016  . Normal coronary arteries 2009 01/14/2015  . Obesity-BMI 40 01/14/2015  . Restless leg 01/14/2015  . Chest pain 01/14/2015  . Back pain 01/14/2015  . Renal insufficiency 01/14/2015  . Dementia- mild memory issues 01/14/2015  . Occult  blood positive stool 12/14/2014  . Anemia 12/14/2014  . Family history of colon cancer 12/14/2014  . Change in bowel habits 12/14/2014  . GERD (gastroesophageal reflux disease) 12/14/2014  . Dyspnea 05/17/2012  . Lymphedema 05/17/2012  . Weight gain 05/17/2012  . HTN (hypertension) 05/17/2012    Andrey Spearman, MS, OTR/L,  [facility] 01/27/21 12:08 PM  [place] [facility] SERVICES [address] [place], [place], [address] Phone: [phone]   Fax:  [phone]  Name: [name] MRN: [id number] Date of Birth: October 06, 1945

## 2021-02-03 ENCOUNTER — Other Ambulatory Visit: Payer: Self-pay

## 2021-02-03 ENCOUNTER — Ambulatory Visit: Payer: Medicare Other | Admitting: Occupational Therapy

## 2021-02-03 DIAGNOSIS — I890 Lymphedema, not elsewhere classified: Secondary | ICD-10-CM

## 2021-02-03 NOTE — Therapy (Signed)
[place] [facility] SERVICES [address] [place], [place], [address] Phone: [phone]   Fax:  [phone]  Patient Details  Name: [name] MRN: [id number] Date of Birth: [birth year] Referring Provider:  Jennette Bill., MD  Encounter Date: 02/03/2021  Pt arrived unaccompanied . Pt spent more than scheduled OT visit time in restroom. No therapeutic treatment provided.  Andrey Spearman, MS, OTR/L, CLT-LANA 02/03/21 1:00 PM   [place] [facility] SERVICES [address] [place], [place], [address] Phone: [phone]   Fax:  [phone]

## 2021-02-06 ENCOUNTER — Encounter (HOSPITAL_COMMUNITY)
Admission: RE | Admit: 2021-02-06 | Discharge: 2021-02-06 | Disposition: A | Discharge status: Home / Self Care | Payer: Medicare Other | Source: Ambulatory Visit | Attending: Physician Assistant | Admitting: Physician Assistant

## 2021-02-06 ENCOUNTER — Other Ambulatory Visit: Payer: Self-pay

## 2021-02-06 DIAGNOSIS — Z96652 Presence of left artificial knee joint: Secondary | ICD-10-CM

## 2021-02-06 MED ORDER — TECHNETIUM TC 99M MEDRONATE IV KIT
20.0000 | PACK | Freq: Once | INTRAVENOUS | Status: AC | PRN
  Administered 2021-02-06: 21.9 via INTRAVENOUS

## 2021-02-10 ENCOUNTER — Ambulatory Visit: Payer: Medicare Other | Admitting: Occupational Therapy

## 2021-02-12 ENCOUNTER — Encounter: Payer: Self-pay | Admitting: Orthopaedic Surgery

## 2021-02-12 ENCOUNTER — Ambulatory Visit (INDEPENDENT_AMBULATORY_CARE_PROVIDER_SITE_OTHER): Payer: Medicare Other | Admitting: Orthopaedic Surgery

## 2021-02-12 DIAGNOSIS — M25562 Pain in left knee: Secondary | ICD-10-CM | POA: Diagnosis not present

## 2021-02-12 DIAGNOSIS — G8929 Other chronic pain: Secondary | ICD-10-CM

## 2021-02-12 DIAGNOSIS — Z96659 Presence of unspecified artificial knee joint: Secondary | ICD-10-CM | POA: Diagnosis not present

## 2021-02-12 DIAGNOSIS — T84018D Broken internal joint prosthesis, other site, subsequent encounter: Secondary | ICD-10-CM | POA: Diagnosis not present

## 2021-02-12 NOTE — Progress Notes (Signed)
I am seeing [name] in follow-up as it relates to her painful left total knee arthroplasty.  This knee was replaced years ago by one of my colleagues in town.  She eventually came to me and we saw her for needed had some instability on exam and ended up upsizing her polyliner to give her more stability of that knee.  Over time her knee is hurt worse.  She is someone who is significantly morbidly obese.  She does have congestive heart failure as well and chronic renal insufficiency.  She does ambulate on this leg but has been a fall risk due to pain with the knee.  Her patella on that left side is chronically subluxed laterally.  I sent her for a three-phase bone scan to rule out prosthetic loosening based on what I can see on plain films which was concerning for potential loosening of the prosthesis.  The three-phase bone scan does show evidence of prosthetic loosening of the left knee.  At this point an option would be a revision arthroplasty of the left knee with revising all components.  She understands this would be quite a difficult undertaking due to the large soft tissue envelope around her left knee which will certainly make getting the alignment better difficult but certainly not impossible.  We will work on making sure the components were rotated appropriately to allow the patella to tract better and more centrally.  We discussed the risk of the surgery this could definitely affect her in terms of her chronic medical problems.  This would certainly have a high risk of infection and failure given her morbid obesity and her other comorbidities.  She would need cardiac clearance from Dr. Aundra Dubin her cardiologist and would likely see her nephrologist as well prior to this.  Certainly any acute blood loss anemia could affect her kidneys but we do perform this using a tourniquet.  I think technically the surgery will be quite difficult given her body habitus but we are willing to proceed with surgery since  this is a significant quality-of-life issue for her.  I spoke in length to her and her daughter today about this.  Her daughter is going to get her follow-up appointments with cardiology and nephrology and is going talk further to her mother and her brother and let us know if they would like [place] to proceed with scheduling her for a left knee revision arthroplasty.  All questions and concerns were answered and addressed.

## 2021-02-17 ENCOUNTER — Encounter: Payer: Medicare Other | Admitting: Occupational Therapy

## 2021-03-03 ENCOUNTER — Encounter: Payer: Medicare Other | Admitting: Occupational Therapy

## 2021-03-04 ENCOUNTER — Emergency Department: Payer: Medicare Other

## 2021-03-04 ENCOUNTER — Inpatient Hospital Stay
Admission: EM | Admit: 2021-03-04 | Discharge: 2021-03-11 | DRG: 689 | Disposition: A | Discharge status: Skilled Nursing Facility | Payer: Medicare Other | Source: Skilled Nursing Facility | Attending: Internal Medicine | Admitting: Internal Medicine

## 2021-03-04 ENCOUNTER — Encounter: Payer: Self-pay | Admitting: Radiology

## 2021-03-04 ENCOUNTER — Other Ambulatory Visit: Payer: Self-pay

## 2021-03-04 DIAGNOSIS — I5033 Acute on chronic diastolic (congestive) heart failure: Secondary | ICD-10-CM | POA: Diagnosis present

## 2021-03-04 DIAGNOSIS — J440 Chronic obstructive pulmonary disease with acute lower respiratory infection: Secondary | ICD-10-CM | POA: Diagnosis present

## 2021-03-04 DIAGNOSIS — Z7951 Long term (current) use of inhaled steroids: Secondary | ICD-10-CM

## 2021-03-04 DIAGNOSIS — Z8249 Family history of ischemic heart disease and other diseases of the circulatory system: Secondary | ICD-10-CM

## 2021-03-04 DIAGNOSIS — I890 Lymphedema, not elsewhere classified: Secondary | ICD-10-CM | POA: Diagnosis present

## 2021-03-04 DIAGNOSIS — J9601 Acute respiratory failure with hypoxia: Secondary | ICD-10-CM | POA: Diagnosis present

## 2021-03-04 DIAGNOSIS — Z90710 Acquired absence of both cervix and uterus: Secondary | ICD-10-CM

## 2021-03-04 DIAGNOSIS — K219 Gastro-esophageal reflux disease without esophagitis: Secondary | ICD-10-CM | POA: Diagnosis present

## 2021-03-04 DIAGNOSIS — F32A Depression, unspecified: Secondary | ICD-10-CM | POA: Diagnosis present

## 2021-03-04 DIAGNOSIS — N390 Urinary tract infection, site not specified: Secondary | ICD-10-CM | POA: Diagnosis not present

## 2021-03-04 DIAGNOSIS — I509 Heart failure, unspecified: Secondary | ICD-10-CM

## 2021-03-04 DIAGNOSIS — Z7982 Long term (current) use of aspirin: Secondary | ICD-10-CM

## 2021-03-04 DIAGNOSIS — Z8701 Personal history of pneumonia (recurrent): Secondary | ICD-10-CM

## 2021-03-04 DIAGNOSIS — E1122 Type 2 diabetes mellitus with diabetic chronic kidney disease: Secondary | ICD-10-CM | POA: Diagnosis present

## 2021-03-04 DIAGNOSIS — Z20822 Contact with and (suspected) exposure to covid-19: Secondary | ICD-10-CM | POA: Diagnosis present

## 2021-03-04 DIAGNOSIS — M069 Rheumatoid arthritis, unspecified: Secondary | ICD-10-CM | POA: Diagnosis present

## 2021-03-04 DIAGNOSIS — Z9851 Tubal ligation status: Secondary | ICD-10-CM

## 2021-03-04 DIAGNOSIS — E785 Hyperlipidemia, unspecified: Secondary | ICD-10-CM | POA: Diagnosis present

## 2021-03-04 DIAGNOSIS — J449 Chronic obstructive pulmonary disease, unspecified: Secondary | ICD-10-CM

## 2021-03-04 DIAGNOSIS — F0390 Unspecified dementia without behavioral disturbance: Secondary | ICD-10-CM | POA: Diagnosis present

## 2021-03-04 DIAGNOSIS — M797 Fibromyalgia: Secondary | ICD-10-CM | POA: Diagnosis present

## 2021-03-04 DIAGNOSIS — Z8673 Personal history of transient ischemic attack (TIA), and cerebral infarction without residual deficits: Secondary | ICD-10-CM

## 2021-03-04 DIAGNOSIS — G9341 Metabolic encephalopathy: Secondary | ICD-10-CM

## 2021-03-04 DIAGNOSIS — Z79899 Other long term (current) drug therapy: Secondary | ICD-10-CM

## 2021-03-04 DIAGNOSIS — R748 Abnormal levels of other serum enzymes: Secondary | ICD-10-CM

## 2021-03-04 DIAGNOSIS — Z79890 Hormone replacement therapy (postmenopausal): Secondary | ICD-10-CM

## 2021-03-04 DIAGNOSIS — Z981 Arthrodesis status: Secondary | ICD-10-CM

## 2021-03-04 DIAGNOSIS — I130 Hypertensive heart and chronic kidney disease with heart failure and stage 1 through stage 4 chronic kidney disease, or unspecified chronic kidney disease: Secondary | ICD-10-CM | POA: Diagnosis present

## 2021-03-04 DIAGNOSIS — G4733 Obstructive sleep apnea (adult) (pediatric): Secondary | ICD-10-CM | POA: Diagnosis present

## 2021-03-04 DIAGNOSIS — E876 Hypokalemia: Secondary | ICD-10-CM | POA: Diagnosis not present

## 2021-03-04 DIAGNOSIS — G2581 Restless legs syndrome: Secondary | ICD-10-CM | POA: Diagnosis present

## 2021-03-04 DIAGNOSIS — G9340 Encephalopathy, unspecified: Secondary | ICD-10-CM

## 2021-03-04 DIAGNOSIS — B9620 Unspecified Escherichia coli [E. coli] as the cause of diseases classified elsewhere: Secondary | ICD-10-CM | POA: Diagnosis present

## 2021-03-04 DIAGNOSIS — Z6841 Body Mass Index (BMI) 40.0 and over, adult: Secondary | ICD-10-CM

## 2021-03-04 DIAGNOSIS — N3000 Acute cystitis without hematuria: Secondary | ICD-10-CM

## 2021-03-04 DIAGNOSIS — E039 Hypothyroidism, unspecified: Secondary | ICD-10-CM | POA: Diagnosis present

## 2021-03-04 DIAGNOSIS — K5900 Constipation, unspecified: Secondary | ICD-10-CM | POA: Diagnosis not present

## 2021-03-04 DIAGNOSIS — J189 Pneumonia, unspecified organism: Secondary | ICD-10-CM | POA: Diagnosis present

## 2021-03-04 DIAGNOSIS — N1831 Chronic kidney disease, stage 3a: Secondary | ICD-10-CM | POA: Diagnosis present

## 2021-03-04 DIAGNOSIS — Z841 Family history of disorders of kidney and ureter: Secondary | ICD-10-CM

## 2021-03-04 LAB — COMPREHENSIVE METABOLIC PANEL
ALT: 16 U/L (ref 0–44)
AST: 25 U/L (ref 15–41)
Albumin: 3.9 g/dL (ref 3.5–5.0)
Alkaline Phosphatase: 116 U/L (ref 38–126)
Anion gap: 13 (ref 5–15)
BUN: 34 mg/dL — ABNORMAL HIGH (ref 8–23)
CO2: 29 mmol/L (ref 22–32)
Calcium: 10 mg/dL (ref 8.9–10.3)
Chloride: 94 mmol/L — ABNORMAL LOW (ref 98–111)
Creatinine, Ser: 1.53 mg/dL — ABNORMAL HIGH (ref 0.44–1.00)
GFR, Estimated: 35 mL/min — ABNORMAL LOW (ref >60)
Glucose, Bld: 98 mg/dL (ref 70–99)
Potassium: 4.6 mmol/L (ref 3.5–5.1)
Sodium: 136 mmol/L (ref 135–145)
Total Bilirubin: 1.2 mg/dL (ref 0.3–1.2)
Total Protein: 7.6 g/dL (ref 6.5–8.1)

## 2021-03-04 LAB — RESP PANEL BY RT-PCR (FLU A&B, COVID) ARPGX2
Influenza A by PCR: NEGATIVE
Influenza B by PCR: NEGATIVE
SARS Coronavirus 2 by RT PCR: NEGATIVE

## 2021-03-04 LAB — CBC
HCT: 41.1 % (ref 36.0–46.0)
Hemoglobin: 12.9 g/dL (ref 12.0–15.0)
MCH: 27.7 pg (ref 26.0–34.0)
MCHC: 31.4 g/dL (ref 30.0–36.0)
MCV: 88.4 fL (ref 80.0–100.0)
Platelets: 221 10*3/uL (ref 150–400)
RBC: 4.65 MIL/uL (ref 3.87–5.11)
RDW: 17.4 % — ABNORMAL HIGH (ref 11.5–15.5)
WBC: 8.7 10*3/uL (ref 4.0–10.5)
nRBC: 0 % (ref 0.0–0.2)

## 2021-03-04 LAB — LIPASE, BLOOD: Lipase: 135 U/L — ABNORMAL HIGH (ref 11–51)

## 2021-03-04 MED ORDER — MORPHINE SULFATE (PF) 4 MG/ML IV SOLN
4.0000 mg | INTRAVENOUS | Status: DC | PRN
  Administered 2021-03-04 – 2021-03-06 (×5): 4 mg via INTRAVENOUS
  Filled 2021-03-04 (×6): qty 1

## 2021-03-04 MED ORDER — SODIUM CHLORIDE 0.9 % IV BOLUS
500.0000 mL | Freq: Once | INTRAVENOUS | Status: AC
  Administered 2021-03-04: 500 mL via INTRAVENOUS

## 2021-03-04 MED ORDER — ROPINIROLE HCL 1 MG PO TABS
2.0000 mg | ORAL_TABLET | Freq: Once | ORAL | Status: AC
  Administered 2021-03-04: 2 mg via ORAL
  Filled 2021-03-04: qty 2

## 2021-03-04 MED ORDER — ONDANSETRON HCL 4 MG/2ML IJ SOLN
4.0000 mg | Freq: Once | INTRAMUSCULAR | Status: AC
  Administered 2021-03-04: 4 mg via INTRAVENOUS
  Filled 2021-03-04: qty 2

## 2021-03-04 MED ORDER — HALOPERIDOL LACTATE 5 MG/ML IJ SOLN: 5.0000 mg | Freq: Once | INTRAMUSCULAR | Status: DC

## 2021-03-04 MED ORDER — IOHEXOL 350 MG/ML SOLN
75.0000 mL | Freq: Once | INTRAVENOUS | Status: AC | PRN
  Administered 2021-03-04: 75 mL via INTRAVENOUS

## 2021-03-04 NOTE — ED Triage Notes (Signed)
Pt is from  local nursing home , where staff states she has been moaning and agitated since 3 am .  Pt is able to voice verbal back and neck pain . She is warm to the touch .  Pt is A&Ox4 . The pt  is very anxious at this time . Vitals as noted in chart . ABCS appear WNL

## 2021-03-04 NOTE — ED Provider Notes (Signed)
[facility] Emergency Department Provider Note    Event Date/Time   First MD Initiated Contact with Patient 03/04/21 2104     (approximate)  I have reviewed the triage vital signs and the nursing notes.   HISTORY  Chief Complaint Back Pain and Neck Pain  Level V Caveat: AMS  HPI [name] is a 75 y.o. female extensive past medical history as listed below presents from facility due to altered mental status.  Probably is not been taking any of her medications since 3 PM.  Patient arrives tearful hyperventilating very anxious appearing yelling repeatedly, "I want Vicente Males."  Reported was otherwise acting normal this morning.  Was last normal sometime around three-point reportedly started using take medication.   Past Medical History:  Diagnosis Date   Anemia    Anxiety    Arthritis    "hands, feet" (03/03/2017)   Brain lesion    2 types   Cataract    Cervical spondylosis with myelopathy    Chest pain    Normal cardiac cath 5/09   Chronic lower back pain    CKD (chronic kidney disease), stage III ([place])    [name]   Congestive heart failure (CHF) (HCC)    has a Cardiomems implant    Constipation    Dementia ([place])    Depression    Dumping syndrome    Dyspnea    with activity   Edema    Fatigue    Fibromyalgia    GERD (gastroesophageal reflux disease)    Headache    "w/high CBG" (03/03/2017)   History of echocardiogram    a. Echo 03/20/16 (done at [facility] in [place], [place]):  mild LVH, EF 79%, normal diastolic function, mild LAE, MAC, RVSP 25 mmHg   Hyperlipidemia    Hypertension    Hypothyroidism    Lumbar spondylosis    Lymphedema    seeing specialist for this   Migraine    "mostly stopped when I changed my diet" (03/03/2017)   OSA on CPAP    Pneumonia X 1   PONV (postoperative nausea and vomiting)    Restless leg syndrome    Rheumatoid arthritis ([place])    Stroke ([place])    TIAs "mini strokes"- unsure of last  TIA - pt and daughter deny this   Syncope    Tremors of nervous system    Type II diabetes mellitus ([place])    Family History  Problem Relation Age of Onset   Heart disease Mother    Heart failure Mother    Kidney disease Mother    Lung cancer Father    Colon cancer Brother    Cancer Brother        malignant thymoma   Heart disease Brother    Tremor Maternal Grandmother 90   Ovarian cancer Maternal Grandmother    Uterine cancer Maternal Grandmother    Irritable bowel syndrome Daughter    Esophageal cancer Neg Hx    Stomach cancer Neg Hx    Inflammatory bowel disease Neg Hx    Liver disease Neg Hx    Pancreatic cancer Neg Hx    Rectal cancer Neg Hx    Past Surgical History:  Procedure Laterality Date   APPENDECTOMY  1966   BACK SURGERY     CARDIAC CATHETERIZATION N/A 05/25/2016   Procedure: Right/Left Heart Cath and Coronary Angiography;  Surgeon: Larey Dresser, MD;  Location: [place] [facility];  Service: Cardiovascular;  Laterality: N/A;   CATARACT EXTRACTION W/ INTRAOCULAR LENS  IMPLANT, BILATERAL Bilateral    COLONOSCOPY     CORONARY ANGIOGRAM  2009   Normal coronaries   EXCISION/RELEASE BURSA HIP Bilateral    I & D KNEE WITH POLY EXCHANGE Left 03/02/2017   Procedure: Left knee Revision of poly liner;  Surgeon: Mcarthur Rossetti, MD;  Location: [facility];  Service: Orthopedics;  Laterality: Left;   JOINT REPLACEMENT     KNEE ARTHROSCOPY Bilateral    LAPAROSCOPIC CHOLECYSTECTOMY  2015   LMF  2017   CardiMEMS HF implant for CHF (measures amount of fluid in the heart)   LUMBAR DISC SURGERY     LUMBAR LAMINECTOMY/DECOMPRESSION MICRODISCECTOMY Left 12/2005   L2-3 laminectomy and diskectomy/notes 01/06/2011   POSTERIOR LUMBAR FUSION  04/2006   [name] 01/06/2011; "put cages in"   REVERSE SHOULDER ARTHROPLASTY Left 06/15/2017   Procedure: REVERSE LEFT SHOULDER ARTHROPLASTY;  Surgeon: Meredith Pel, MD;  Location: [facility];  Service: Orthopedics;  Laterality: Left;    REVERSE SHOULDER ARTHROPLASTY Right 02/01/2018   REVERSE SHOULDER ARTHROPLASTY Right 02/01/2018   Procedure: RIGHT REVERSE SHOULDER ARTHROPLASTY;  Surgeon: Meredith Pel, MD;  Location: [facility];  Service: Orthopedics;  Laterality: Right;   REVISION TOTAL KNEE ARTHROPLASTY Left 03/02/2017   poly liner/notes 03/02/2017   RIGHT HEART CATH N/A 11/13/2016   Procedure: Right Heart Cath with Cardiomems;  Surgeon: Larey Dresser, MD;  Location: [facility];  Service: Cardiovascular;  Laterality: N/A;   SHOULDER ARTHROSCOPY WITH ROTATOR CUFF REPAIR Right    SHOULDER OPEN ROTATOR CUFF REPAIR Left 01/2011   [name] 01/29/2011   TOTAL ABDOMINAL HYSTERECTOMY     TOTAL KNEE ARTHROPLASTY Left    TOTAL KNEE ARTHROPLASTY Right 11/18/2012   Procedure: TOTAL KNEE ARTHROPLASTY;  Surgeon: Yvette Rack., MD;  Location: [place];  Service: Orthopedics;  Laterality: Right;   TUBAL LIGATION     UPPER GASTROINTESTINAL ENDOSCOPY     UPPER GI ENDOSCOPY     Patient Active Problem List   Diagnosis Date Noted   Iron deficiency 08/06/2018   History of total left knee replacement (TKR) 07/18/2018   Rectal bleeding 07/05/2018   Hemorrhoids 07/05/2018   Anal itching 07/05/2018   Rotator cuff arthropathy of right shoulder    Shoulder arthritis 06/15/2017   Status post revision of total knee replacement, left 04/29/2017   Polyethylene liner wear following left total knee arthroplasty requiring isolated polyethylene liner exchange ([place]) 03/02/2017   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[place]) 03/02/2017   Unstable angina ([place]) 05/21/2016   Chronic diastolic heart failure ([place]) 03/29/2016   Normal coronary arteries 2009 01/14/2015   Obesity-BMI 40 01/14/2015   Restless leg 01/14/2015   Chest pain 01/14/2015   Back pain 01/14/2015   Renal insufficiency 01/14/2015   Dementia- mild memory issues 01/14/2015   Occult blood positive stool 12/14/2014   Anemia 12/14/2014   Family history of colon cancer  12/14/2014   Change in bowel habits 12/14/2014   GERD (gastroesophageal reflux disease) 12/14/2014   Dyspnea 05/17/2012   Lymphedema 05/17/2012   Weight gain 05/17/2012   HTN (hypertension) 05/17/2012      Prior to Admission medications   Medication Sig Start Date End Date Taking? Authorizing Provider  acetaminophen (TYLENOL) 650 MG CR tablet Take 650 mg by mouth every 4 (four) hours as needed for pain.    [provider]  amLODipine (NORVASC) 2.5 MG tablet Take 2.5 mg by mouth  daily.    [provider]  antiseptic oral rinse (BIOTENE) LIQD 10 mLs by Mouth Rinse route 4 (four) times daily. (0900, 1000, 1200 & 2100)    [provider]  aspirin EC 81 MG tablet Take 1 tablet (81 mg total) by mouth daily. 11/29/17   Larey Dresser, MD  atorvastatin (LIPITOR) 40 MG tablet Take 40 mg by mouth daily.    [provider]  baclofen (LIORESAL) 10 MG tablet Take 10 mg by mouth 3 (three) times daily. (0900, 1400, & 2100)    [provider]  budesonide (PULMICORT) 0.5 MG/2ML nebulizer solution Take 0.5 mg by nebulization 2 (two) times daily. (1000 & 2200)    [provider]  carvedilol (COREG) 6.25 MG tablet Take 6.25 mg by mouth 2 (two) times daily with a meal.    [provider]  Cholecalciferol (VITAMIN D3) 1000 units CAPS Take 1,000 Units by mouth daily. (0900)    [provider]  Cyanocobalamin (VITAMIN B-12 IJ) Inject 1,000 mcg as directed every 30 (thirty) days. Weekly for 4 weeks, has 1 more dose this week (week of 7/2) then will switch to once a month     [provider]  docusate sodium (COLACE) 100 MG capsule Take 100 mg by mouth 2 (two) times daily.    [provider]  DULoxetine (CYMBALTA) 30 MG capsule Take 30 mg by mouth daily. (0900)    [provider]  erythromycin ophthalmic ointment 1 application at bedtime.    [provider]  gabapentin (NEURONTIN) 300 MG capsule Take 400 mg by  mouth 2 (two) times daily. (0900 & 2100)    [provider]  HYDROcodone-acetaminophen (NORCO) 7.5-325 MG tablet Take 1 tablet by mouth every 6 (six) hours as needed.  05/09/18   [provider]  ipratropium-albuterol (DUONEB) 0.5-2.5 (3) MG/3ML SOLN Take 3 mLs by nebulization every 6 (six) hours as needed (FOR WHEEZING/SHORTNESS OF BREATH).    [provider]  lamoTRIgine (LAMICTAL) 100 MG tablet Take 100 mg by mouth 2 (two) times daily. (0900 & 2100) 11/03/14   [provider]  levothyroxine (SYNTHROID, LEVOTHROID) 88 MCG tablet Take 88 mcg by mouth daily before breakfast. (0900)    [provider]  loratadine (CLARITIN) 10 MG tablet Take 10 mg by mouth daily.    [provider]  Melatonin 3 MG CAPS Take 3 mg by mouth at bedtime. (2100)    [provider]  omeprazole (PRILOSEC) 20 MG capsule Take 20 mg by mouth 2 (two) times daily before a meal. (0900 & 1700)    [provider]  oxyCODONE-acetaminophen (PERCOCET/ROXICET) 5-325 MG tablet Take 1 tablet by mouth every 4 (four) hours as needed for severe pain. 12/03/20   Paulette Blanch, MD  rOPINIRole (REQUIP) 2 MG tablet Take 2 mg by mouth 3 (three) times daily.    [provider]  sennosides-docusate sodium (SENOKOT-S) 8.6-50 MG tablet Take 1 tablet by mouth daily.    [provider]  sucralfate (CARAFATE) 1 GM/10ML suspension Take 1 g by mouth 4 (four) times daily -  with meals and at bedtime.    [provider]  torsemide (DEMADEX) 20 MG tablet Take 80 mg by mouth daily.    [provider]  traZODone (DESYREL) 50 MG tablet Take 50 mg by mouth at bedtime. (2100) 05/17/17   [provider]    Allergies Patient has no known allergies.    Social History Social History  Tobacco Use   Smoking status: Never   Smokeless tobacco: Never  Vaping Use   Vaping Use: Never used  Substance Use Topics   Alcohol use: No    Alcohol/week: 0.0  standard drinks   Drug use: No    Review of Systems Patient denies headaches, rhinorrhea, blurry vision, numbness, shortness of breath, chest pain, edema, cough, abdominal pain, nausea, vomiting, diarrhea, dysuria, fevers, rashes or hallucinations unless otherwise stated above in HPI. ____________________________________________   PHYSICAL EXAM:  VITAL SIGNS: Vitals:   03/04/21 2119  BP: 113/61  Resp: (!) 32  Temp: 98.9 F (37.2 C)  SpO2: 90%    Constitutional: Alert, anxious and tearful Eyes: Conjunctivae are normal.  Head: Atraumatic. Nose: No congestion/rhinnorhea. Mouth/Throat: Mucous membranes are moist.   Neck: No stridor. Painless ROM.  Cardiovascular: Normal rate, regular rhythm. Grossly normal heart sounds.  Good peripheral circulation. Respiratory: hyperventilating.  No retractions. Lungs CTAB. Gastrointestinal: Soft and nontender. No distention. No abdominal bruits. No CVA tenderness. Genitourinary: deferred Musculoskeletal: No lower extremity tenderness nor edema.  No joint effusions. Neurologic:  anxious,  MAE, unable to cooperate with full exam Skin:  Skin is warm, dry and intact. No rash noted. Psychiatric: Mood and affect are normal. Speech and behavior are normal.  ____________________________________________   LABS (all labs ordered are listed, but only abnormal results are displayed)  Results for orders placed or performed during the hospital encounter of 03/04/21 (from the past 24 hour(s))  CBC     Status: Abnormal   Collection Time: 03/04/21  9:30 PM  Result Value Ref Range   WBC 8.7 4.0 - 10.5 K/uL   RBC 4.65 3.87 - 5.11 MIL/uL   Hemoglobin 12.9 12.0 - 15.0 g/dL   HCT 41.1 36.0 - 46.0 %   MCV 88.4 80.0 - 100.0 fL   MCH 27.7 26.0 - 34.0 pg   MCHC 31.4 30.0 - 36.0 g/dL   RDW 17.4 (H) 11.5 - 15.5 %   Platelets 221 150 - 400 K/uL   nRBC 0.0 0.0 - 0.2 %  Comprehensive metabolic panel     Status: Abnormal   Collection Time: 03/04/21  9:30 PM   Result Value Ref Range   Sodium 136 135 - 145 mmol/L   Potassium 4.6 3.5 - 5.1 mmol/L   Chloride 94 (L) 98 - 111 mmol/L   CO2 29 22 - 32 mmol/L   Glucose, Bld 98 70 - 99 mg/dL   BUN 34 (H) 8 - 23 mg/dL   Creatinine, Ser 1.53 (H) 0.44 - 1.00 mg/dL   Calcium 10.0 8.9 - 10.3 mg/dL   Total Protein 7.6 6.5 - 8.1 g/dL   Albumin 3.9 3.5 - 5.0 g/dL   AST 25 15 - 41 U/L   ALT 16 0 - 44 U/L   Alkaline Phosphatase 116 38 - 126 U/L   Total Bilirubin 1.2 0.3 - 1.2 mg/dL   GFR, Estimated 35 (L) >60 mL/min   Anion gap 13 5 - 15  Lipase, blood     Status: Abnormal   Collection Time: 03/04/21  9:30 PM  Result Value Ref Range   Lipase 135 (H) 11 - 51 U/L   ____________________________________________  EKG My review and personal interpretation at Time: 21:10   Indication: ams  Rate: 90  Rhythm: sinus Axis: left Other: nonspecific st abn, no stemi ____________________________________________  RADIOLOGY  I personally reviewed all radiographic images ordered to evaluate for the above acute complaints and reviewed radiology reports and findings.  These findings were personally discussed with the patient.  Please see medical record for radiology report.  ____________________________________________   PROCEDURES  Procedure(s) performed:  Procedures    Critical Care performed: no ____________________________________________   INITIAL IMPRESSION / ASSESSMENT AND PLAN / ED COURSE  Pertinent labs & imaging results that were available during my care of the patient were reviewed by me and considered in my medical decision making (see chart for details).   DDX: Dehydration, sepsis, pna, uti, hypoglycemia, cva, drug effect, withdrawal, encephalitis   [name] is a 75 y.o. who presents to the ED with presentation as described above.  Patient with bizarre behavior does appear severely anxious.  Is able to be redirected and will answer questions.  She is moving all extremities  spontaneously.  Very broad differential at this point with somewhat odd presentation.  Blood work as well as CT imaging will be ordered for the but differential.  Clinical Course as of 03/04/21 2339  Tue Mar 04, 2021  2308 Patient with persistent odd behavior will intermittently say that she is having some back pain.  Lipase mildly elevated.  She is on 2 L nasal cannula which is uncertain as to whether that is her baseline.  Given the back pain will order CTA to evaluate for PE. [PR]    Clinical Course User Index [PR] Merlyn Lot, MD   Patient be signed out to oncoming physician pending follow-up on imaging and remainder of labs.  I do anticipate patient will require hospitalization given her altered mental status presentation and age.  Family updated at bedside.  The patient was evaluated in Emergency Department today for the symptoms described in the history of present illness. He/she was evaluated in the context of the global COVID-19 pandemic, which necessitated consideration that the patient might be at risk for infection with the SARS-CoV-2 virus that causes COVID-19. Institutional protocols and algorithms that pertain to the evaluation of patients at risk for COVID-19 are in a state of rapid change based on information released by regulatory bodies including the CDC and federal and state organizations. These policies and algorithms were followed during the patient's care in the ED.  As part of my medical decision making, I reviewed the following data within the [facility] notes reviewed and incorporated, Labs reviewed, notes from prior ED visits and [place] Controlled Substance Database   ____________________________________________   FINAL CLINICAL IMPRESSION(S) / ED DIAGNOSES  Final diagnoses:  Acute encephalopathy      NEW MEDICATIONS STARTED DURING THIS VISIT:  New Prescriptions   No medications on file     Note:  This document was prepared using  Dragon voice recognition software and may include unintentional dictation errors.    Merlyn Lot, MD 03/04/21 [phone]

## 2021-03-05 ENCOUNTER — Other Ambulatory Visit: Payer: Self-pay

## 2021-03-05 ENCOUNTER — Encounter: Payer: Self-pay | Admitting: Internal Medicine

## 2021-03-05 ENCOUNTER — Inpatient Hospital Stay (HOSPITAL_COMMUNITY)
Admit: 2021-03-05 | Discharge: 2021-03-05 | Disposition: A | Discharge status: Home / Self Care | Payer: Medicare Other | Attending: Internal Medicine | Admitting: Internal Medicine

## 2021-03-05 DIAGNOSIS — Z20822 Contact with and (suspected) exposure to covid-19: Secondary | ICD-10-CM | POA: Diagnosis present

## 2021-03-05 DIAGNOSIS — F0390 Unspecified dementia without behavioral disturbance: Secondary | ICD-10-CM | POA: Diagnosis present

## 2021-03-05 DIAGNOSIS — I5033 Acute on chronic diastolic (congestive) heart failure: Secondary | ICD-10-CM | POA: Diagnosis present

## 2021-03-05 DIAGNOSIS — J9601 Acute respiratory failure with hypoxia: Secondary | ICD-10-CM

## 2021-03-05 DIAGNOSIS — K219 Gastro-esophageal reflux disease without esophagitis: Secondary | ICD-10-CM | POA: Diagnosis present

## 2021-03-05 DIAGNOSIS — N1831 Chronic kidney disease, stage 3a: Secondary | ICD-10-CM

## 2021-03-05 DIAGNOSIS — G9341 Metabolic encephalopathy: Secondary | ICD-10-CM

## 2021-03-05 DIAGNOSIS — N390 Urinary tract infection, site not specified: Secondary | ICD-10-CM | POA: Diagnosis present

## 2021-03-05 DIAGNOSIS — I5032 Chronic diastolic (congestive) heart failure: Secondary | ICD-10-CM | POA: Diagnosis not present

## 2021-03-05 DIAGNOSIS — J440 Chronic obstructive pulmonary disease with acute lower respiratory infection: Secondary | ICD-10-CM | POA: Diagnosis present

## 2021-03-05 DIAGNOSIS — I130 Hypertensive heart and chronic kidney disease with heart failure and stage 1 through stage 4 chronic kidney disease, or unspecified chronic kidney disease: Secondary | ICD-10-CM | POA: Diagnosis present

## 2021-03-05 DIAGNOSIS — J189 Pneumonia, unspecified organism: Secondary | ICD-10-CM | POA: Diagnosis present

## 2021-03-05 DIAGNOSIS — B9620 Unspecified Escherichia coli [E. coli] as the cause of diseases classified elsewhere: Secondary | ICD-10-CM | POA: Diagnosis present

## 2021-03-05 DIAGNOSIS — M069 Rheumatoid arthritis, unspecified: Secondary | ICD-10-CM | POA: Diagnosis present

## 2021-03-05 DIAGNOSIS — Z6841 Body Mass Index (BMI) 40.0 and over, adult: Secondary | ICD-10-CM | POA: Diagnosis not present

## 2021-03-05 DIAGNOSIS — F32A Depression, unspecified: Secondary | ICD-10-CM | POA: Diagnosis present

## 2021-03-05 DIAGNOSIS — I509 Heart failure, unspecified: Secondary | ICD-10-CM | POA: Diagnosis present

## 2021-03-05 DIAGNOSIS — I5031 Acute diastolic (congestive) heart failure: Secondary | ICD-10-CM | POA: Diagnosis not present

## 2021-03-05 DIAGNOSIS — E876 Hypokalemia: Secondary | ICD-10-CM | POA: Diagnosis not present

## 2021-03-05 DIAGNOSIS — E785 Hyperlipidemia, unspecified: Secondary | ICD-10-CM | POA: Diagnosis present

## 2021-03-05 DIAGNOSIS — R4182 Altered mental status, unspecified: Secondary | ICD-10-CM | POA: Diagnosis not present

## 2021-03-05 DIAGNOSIS — J449 Chronic obstructive pulmonary disease, unspecified: Secondary | ICD-10-CM

## 2021-03-05 DIAGNOSIS — R748 Abnormal levels of other serum enzymes: Secondary | ICD-10-CM

## 2021-03-05 DIAGNOSIS — M797 Fibromyalgia: Secondary | ICD-10-CM | POA: Diagnosis present

## 2021-03-05 DIAGNOSIS — I890 Lymphedema, not elsewhere classified: Secondary | ICD-10-CM | POA: Diagnosis present

## 2021-03-05 DIAGNOSIS — I10 Essential (primary) hypertension: Secondary | ICD-10-CM

## 2021-03-05 DIAGNOSIS — E039 Hypothyroidism, unspecified: Secondary | ICD-10-CM | POA: Diagnosis present

## 2021-03-05 DIAGNOSIS — E1122 Type 2 diabetes mellitus with diabetic chronic kidney disease: Secondary | ICD-10-CM | POA: Diagnosis present

## 2021-03-05 DIAGNOSIS — K5900 Constipation, unspecified: Secondary | ICD-10-CM | POA: Diagnosis not present

## 2021-03-05 DIAGNOSIS — G2581 Restless legs syndrome: Secondary | ICD-10-CM | POA: Diagnosis present

## 2021-03-05 LAB — CBC
HCT: 37.6 % (ref 36.0–46.0)
Hemoglobin: 11.8 g/dL — ABNORMAL LOW (ref 12.0–15.0)
MCH: 28.3 pg (ref 26.0–34.0)
MCHC: 31.4 g/dL (ref 30.0–36.0)
MCV: 90.2 fL (ref 80.0–100.0)
Platelets: 224 10*3/uL (ref 150–400)
RBC: 4.17 MIL/uL (ref 3.87–5.11)
RDW: 17.7 % — ABNORMAL HIGH (ref 11.5–15.5)
WBC: 7.8 10*3/uL (ref 4.0–10.5)
nRBC: 0 % (ref 0.0–0.2)

## 2021-03-05 LAB — URINALYSIS, COMPLETE (UACMP) WITH MICROSCOPIC
Bilirubin Urine: NEGATIVE
Glucose, UA: NEGATIVE mg/dL
Ketones, ur: NEGATIVE mg/dL
Nitrite: POSITIVE — AB
Protein, ur: NEGATIVE mg/dL
Specific Gravity, Urine: 1.016 (ref 1.005–1.030)
WBC, UA: >50 WBC/hpf — ABNORMAL HIGH (ref 0–5)
pH: 5 (ref 5.0–8.0)

## 2021-03-05 LAB — ECHOCARDIOGRAM COMPLETE
AR max vel: 2.39 cm2
AV Area VTI: 2.71 cm2
AV Area mean vel: 2.79 cm2
AV Mean grad: 4 mmHg
AV Peak grad: 8.8 mmHg
Ao pk vel: 1.48 m/s
Area-P 1/2: 2.58 cm2
Height: 66 in
MV VTI: 3.3 cm2
S' Lateral: 2.7 cm
Weight: 4409.2 oz

## 2021-03-05 LAB — CREATININE, SERUM
Creatinine, Ser: 1.6 mg/dL — ABNORMAL HIGH (ref 0.44–1.00)
GFR, Estimated: 33 mL/min — ABNORMAL LOW (ref >60)

## 2021-03-05 LAB — TROPONIN I (HIGH SENSITIVITY)
Troponin I (High Sensitivity): 15 ng/L (ref <18)
Troponin I (High Sensitivity): 17 ng/L (ref <18)

## 2021-03-05 LAB — LACTIC ACID, PLASMA: Lactic Acid, Venous: 1.1 mmol/L (ref 0.5–1.9)

## 2021-03-05 MED ORDER — ENOXAPARIN SODIUM 80 MG/0.8ML IJ SOSY
0.5000 mg/kg | PREFILLED_SYRINGE | INTRAMUSCULAR | Status: DC
  Administered 2021-03-05 – 2021-03-08 (×4): 62.5 mg via SUBCUTANEOUS
  Filled 2021-03-05: qty 0.63
  Filled 2021-03-05: qty 0.8
  Filled 2021-03-05: qty 0.63
  Filled 2021-03-05 (×2): qty 0.8

## 2021-03-05 MED ORDER — ROPINIROLE HCL 1 MG PO TABS
2.0000 mg | ORAL_TABLET | Freq: Once | ORAL | Status: AC
  Administered 2021-03-05: 2 mg via ORAL
  Filled 2021-03-05: qty 2

## 2021-03-05 MED ORDER — FUROSEMIDE 10 MG/ML IJ SOLN
20.0000 mg | Freq: Once | INTRAMUSCULAR | Status: AC
  Administered 2021-03-05: 20 mg via INTRAVENOUS
  Filled 2021-03-05: qty 4

## 2021-03-05 MED ORDER — ONDANSETRON HCL 4 MG PO TABS: 4.0000 mg | ORAL_TABLET | Freq: Four times a day (QID) | ORAL | Status: DC | PRN

## 2021-03-05 MED ORDER — ACETAMINOPHEN 325 MG PO TABS
650.0000 mg | ORAL_TABLET | Freq: Four times a day (QID) | ORAL | Status: DC | PRN
  Administered 2021-03-05 – 2021-03-06 (×2): 650 mg via ORAL
  Filled 2021-03-05 (×2): qty 2

## 2021-03-05 MED ORDER — SODIUM CHLORIDE 0.9 % IV SOLN
1.0000 g | Freq: Once | INTRAVENOUS | Status: AC
  Administered 2021-03-05: 1 g via INTRAVENOUS
  Filled 2021-03-05: qty 10

## 2021-03-05 MED ORDER — PERFLUTREN LIPID MICROSPHERE
1.0000 mL | INTRAVENOUS | Status: AC | PRN
  Administered 2021-03-05: 2 mL via INTRAVENOUS
  Filled 2021-03-05: qty 10

## 2021-03-05 MED ORDER — ONDANSETRON HCL 4 MG/2ML IJ SOLN
4.0000 mg | Freq: Four times a day (QID) | INTRAMUSCULAR | Status: DC | PRN
  Administered 2021-03-06: 4 mg via INTRAVENOUS
  Filled 2021-03-05: qty 2

## 2021-03-05 MED ORDER — FUROSEMIDE 10 MG/ML IJ SOLN
40.0000 mg | Freq: Two times a day (BID) | INTRAMUSCULAR | Status: DC
  Administered 2021-03-05 – 2021-03-06 (×3): 40 mg via INTRAVENOUS
  Filled 2021-03-05 (×3): qty 4

## 2021-03-05 MED ORDER — SODIUM CHLORIDE 0.9 % IV SOLN
1.0000 g | INTRAVENOUS | Status: DC
  Administered 2021-03-06 – 2021-03-07 (×2): 1 g via INTRAVENOUS
  Filled 2021-03-05 (×2): qty 10
  Filled 2021-03-05: qty 1

## 2021-03-05 MED ORDER — CARVEDILOL 6.25 MG PO TABS
6.2500 mg | ORAL_TABLET | Freq: Two times a day (BID) | ORAL | Status: DC
  Administered 2021-03-05 – 2021-03-11 (×12): 6.25 mg via ORAL
  Filled 2021-03-05 (×12): qty 1

## 2021-03-05 MED ORDER — ACETAMINOPHEN 650 MG RE SUPP: 650.0000 mg | Freq: Four times a day (QID) | RECTAL | Status: DC | PRN

## 2021-03-05 NOTE — H&P (Signed)
History and Physical    [name] YQM:[id number] DOB: December 14, 1945 DOA: 03/04/2021  PCP: System, Provider Not In   Patient coming from: facility  I have personally briefly reviewed patient's old medical records in [place]  Chief Complaint: confusion  HPI: [name] is a 75 y.o. female with medical history significant for Diastolic heart failure on nighttime oxygen, stage IIIa CKD, morbid obesity, hypothyroidism, COPD on nighttime oxygen, who resides at an assisted living facility and was in her usual state of health earlier in the day but then later on became confused to where she could not recognize her daughter.  On arrival to the emergency room she was tearful and yelling out per ED providers report.  According to the daughter this is far from her baseline.  ED course: On arrival afebrile, tachypneic at 32 with pulse 92 blood pressure 113/61 O2 sat 90% on 6 L.  Blood work with lipase of 135 and creatinine of 1.53 which is her baseline but otherwise unremarkable with normal WBC and lactic acid.  Urinalysis with large leukocyte Estrace positive nitrites and many bacteria.  Troponin negative x2.  BNP not done.  COVID and flu negative .  EKG: Sinus rhythm at 93  Imaging: Chest x-ray with pulmonary vascular congestion.  CTA chest negative for acute PE.  CT abdomen and pelvis with contrast with postcholecystectomy findings and otherwise nonacute.  Head and C-spine CTs nonacute  Patient given a dose of Rocephin for UTI, Lasix for CHF.  Hospitalist consulted for admission.    Review of Systems: Limited due to altered mental status  Past Medical History:  Diagnosis Date   Anemia    Anxiety    Arthritis    "hands, feet" (03/03/2017)   Brain lesion    2 types   Cataract    Cervical spondylosis with myelopathy    Chest pain    Normal cardiac cath 5/09   Chronic lower back pain    CKD (chronic kidney disease), stage III ([place])    [name]    Congestive heart failure (CHF) (HCC)    has a Cardiomems implant    Constipation    Dementia ([place])    Depression    Dumping syndrome    Dyspnea    with activity   Edema    Fatigue    Fibromyalgia    GERD (gastroesophageal reflux disease)    Headache    "w/high CBG" (03/03/2017)   History of echocardiogram    a. Echo 03/20/16 (done at [facility] in [place], [place]):  mild LVH, EF 88%, normal diastolic function, mild LAE, MAC, RVSP 25 mmHg   Hyperlipidemia    Hypertension    Hypothyroidism    Lumbar spondylosis    Lymphedema    seeing specialist for this   Migraine    "mostly stopped when I changed my diet" (03/03/2017)   OSA on CPAP    Pneumonia X 1   PONV (postoperative nausea and vomiting)    Restless leg syndrome    Rheumatoid arthritis ([place])    Stroke ([place])    TIAs "mini strokes"- unsure of last TIA - pt and daughter deny this   Syncope    Tremors of nervous system    Type II diabetes mellitus ([place])     Past Surgical History:  Procedure Laterality Date   APPENDECTOMY  1966   BACK SURGERY     CARDIAC CATHETERIZATION N/A 05/25/2016   Procedure: Right/Left Heart Cath  and Coronary Angiography;  Surgeon: Larey Dresser, MD;  Location: [place] [facility];  Service: Cardiovascular;  Laterality: N/A;   CATARACT EXTRACTION W/ INTRAOCULAR LENS  IMPLANT, BILATERAL Bilateral    COLONOSCOPY     CORONARY ANGIOGRAM  2009   Normal coronaries   EXCISION/RELEASE BURSA HIP Bilateral    I & D KNEE WITH POLY EXCHANGE Left 03/02/2017   Procedure: Left knee Revision of poly liner;  Surgeon: Mcarthur Rossetti, MD;  Location: [place];  Service: Orthopedics;  Laterality: Left;   JOINT REPLACEMENT     KNEE ARTHROSCOPY Bilateral    LAPAROSCOPIC CHOLECYSTECTOMY  2015   LMF  2017   CardiMEMS HF implant for CHF (measures amount of fluid in the heart)   LUMBAR DISC SURGERY     LUMBAR LAMINECTOMY/DECOMPRESSION MICRODISCECTOMY Left 12/2005   L2-3 laminectomy and diskectomy/notes 01/06/2011    POSTERIOR LUMBAR FUSION  04/2006   [name] 01/06/2011; "put cages in"   REVERSE SHOULDER ARTHROPLASTY Left 06/15/2017   Procedure: REVERSE LEFT SHOULDER ARTHROPLASTY;  Surgeon: Meredith Pel, MD;  Location: [place];  Service: Orthopedics;  Laterality: Left;   REVERSE SHOULDER ARTHROPLASTY Right 02/01/2018   REVERSE SHOULDER ARTHROPLASTY Right 02/01/2018   Procedure: RIGHT REVERSE SHOULDER ARTHROPLASTY;  Surgeon: Meredith Pel, MD;  Location: [place];  Service: Orthopedics;  Laterality: Right;   REVISION TOTAL KNEE ARTHROPLASTY Left 03/02/2017   poly liner/notes 03/02/2017   RIGHT HEART CATH N/A 11/13/2016   Procedure: Right Heart Cath with Cardiomems;  Surgeon: Larey Dresser, MD;  Location: [place] [facility];  Service: Cardiovascular;  Laterality: N/A;   SHOULDER ARTHROSCOPY WITH ROTATOR CUFF REPAIR Right    SHOULDER OPEN ROTATOR CUFF REPAIR Left 01/2011   [name] 01/29/2011   TOTAL ABDOMINAL HYSTERECTOMY     TOTAL KNEE ARTHROPLASTY Left    TOTAL KNEE ARTHROPLASTY Right 11/18/2012   Procedure: TOTAL KNEE ARTHROPLASTY;  Surgeon: Yvette Rack., MD;  Location: [place] [facility];  Service: Orthopedics;  Laterality: Right;   TUBAL LIGATION     UPPER GASTROINTESTINAL ENDOSCOPY     UPPER GI ENDOSCOPY       reports that she has never smoked. She has never used smokeless tobacco. She reports that she does not drink alcohol and does not use drugs.  No Known Allergies  Family History  Problem Relation Age of Onset   Heart disease Mother    Heart failure Mother    Kidney disease Mother    Lung cancer Father    Colon cancer Brother    Cancer Brother        malignant thymoma   Heart disease Brother    Tremor Maternal Grandmother 90   Ovarian cancer Maternal Grandmother    Uterine cancer Maternal Grandmother    Irritable bowel syndrome Daughter    Esophageal cancer Neg Hx    Stomach cancer Neg Hx    Inflammatory bowel disease Neg Hx    Liver disease Neg Hx    Pancreatic cancer Neg Hx    Rectal  cancer Neg Hx       Prior to Admission medications   Medication Sig Start Date End Date Taking? Authorizing Provider  acetaminophen (TYLENOL) 650 MG CR tablet Take 650 mg by mouth every 4 (four) hours as needed for pain.    [provider]  amLODipine (NORVASC) 2.5 MG tablet Take 2.5 mg by mouth daily.    [provider]  antiseptic oral rinse (BIOTENE) LIQD 10 mLs by Mouth  Rinse route 4 (four) times daily. (0900, 1000, 1200 & 2100)    [provider]  aspirin EC 81 MG tablet Take 1 tablet (81 mg total) by mouth daily. 11/29/17   Larey Dresser, MD  atorvastatin (LIPITOR) 40 MG tablet Take 40 mg by mouth daily.    [provider]  baclofen (LIORESAL) 10 MG tablet Take 10 mg by mouth 3 (three) times daily. (0900, 1400, & 2100)    [provider]  budesonide (PULMICORT) 0.5 MG/2ML nebulizer solution Take 0.5 mg by nebulization 2 (two) times daily. (1000 & 2200)    [provider]  carvedilol (COREG) 6.25 MG tablet Take 6.25 mg by mouth 2 (two) times daily with a meal.    [provider]  Cholecalciferol (VITAMIN D3) 1000 units CAPS Take 1,000 Units by mouth daily. (0900)    [provider]  Cyanocobalamin (VITAMIN B-12 IJ) Inject 1,000 mcg as directed every 30 (thirty) days. Weekly for 4 weeks, has 1 more dose this week (week of 7/2) then will switch to once a month     [provider]  docusate sodium (COLACE) 100 MG capsule Take 100 mg by mouth 2 (two) times daily.    [provider]  DULoxetine (CYMBALTA) 30 MG capsule Take 30 mg by mouth daily. (0900)    [provider]  erythromycin ophthalmic ointment 1 application at bedtime.    [provider]  gabapentin (NEURONTIN) 300 MG capsule Take 400 mg by mouth 2 (two) times daily. (0900 & 2100)    [provider]  HYDROcodone-acetaminophen (NORCO) 7.5-325 MG tablet Take 1 tablet by mouth every 6 (six) hours as needed.  05/09/18    [provider]  ipratropium-albuterol (DUONEB) 0.5-2.5 (3) MG/3ML SOLN Take 3 mLs by nebulization every 6 (six) hours as needed (FOR WHEEZING/SHORTNESS OF BREATH).    [provider]  lamoTRIgine (LAMICTAL) 100 MG tablet Take 100 mg by mouth 2 (two) times daily. (0900 & 2100) 11/03/14   [provider]  levothyroxine (SYNTHROID, LEVOTHROID) 88 MCG tablet Take 88 mcg by mouth daily before breakfast. (0900)    [provider]  loratadine (CLARITIN) 10 MG tablet Take 10 mg by mouth daily.    [provider]  Melatonin 3 MG CAPS Take 3 mg by mouth at bedtime. (2100)    [provider]  omeprazole (PRILOSEC) 20 MG capsule Take 20 mg by mouth 2 (two) times daily before a meal. (0900 & 1700)    [provider]  oxyCODONE-acetaminophen (PERCOCET/ROXICET) 5-325 MG tablet Take 1 tablet by mouth every 4 (four) hours as needed for severe pain. 12/03/20   Paulette Blanch, MD  rOPINIRole (REQUIP) 2 MG tablet Take 2 mg by mouth 3 (three) times daily.    [provider]  sennosides-docusate sodium (SENOKOT-S) 8.6-50 MG tablet Take 1 tablet by mouth daily.    [provider]  sucralfate (CARAFATE) 1 GM/10ML suspension Take 1 g by mouth 4 (four) times daily -  with meals and at bedtime.    [provider]  torsemide (DEMADEX) 20 MG tablet Take 80 mg by mouth daily.    [provider]  traZODone (DESYREL) 50 MG tablet Take 50 mg by mouth at bedtime. (2100) 05/17/17   [provider]    Physical Exam: Vitals:   03/04/21 2230 03/04/21 2300 03/05/21 0000 03/05/21 0030  BP: (!) 124/55 (!) 94/58 121/76 (!) 140/112  Pulse:  82 75   Resp: Marland Kitchen)  _0 Temp:      TempSrc:      SpO2:  95% 92%   Weight:      Height:         Vitals:   03/04/21 2230 03/04/21 2300 03/05/21 0000 03/05/21 0030  BP: (!) 124/55 (!) 94/58 121/76 (!) 140/112  Pulse:  82 75   Resp: (!) _1 Temp:      TempSrc:      SpO2:  95%  92%   Weight:      Height:          Constitutional: Patient resting comfortably but will intermittently become very alert opening her eyes widely and say something that does not make sense and then go back to sleep. Not in any apparent distress HEENT:      Head: Normocephalic and atraumatic.         Eyes: PERLA, EOMI, Conjunctivae are normal. Sclera is non-icteric.       Mouth/Throat: Mucous membranes are moist.       Neck: Supple with no signs of meningismus. Cardiovascular: Regular rate and rhythm. No murmurs, gallops, or rubs. 2+ symmetrical distal pulses are present . No JVD. No LE edema Respiratory: Respiratory effort normal .Lungs sounds clear bilaterally. No wheezes, crackles, or rhonchi.  Gastrointestinal: Soft, non tender, and non distended with positive bowel sounds.  Genitourinary: No CVA tenderness. Musculoskeletal: Nontender with normal range of motion in all extremities. No cyanosis, or erythema of extremities. Neurologic:  Face is symmetric. Moving all extremities. No gross focal neurologic deficits . Skin: Skin is warm, dry.  No rash or ulcers Psychiatric: Patient with bizarre behavior that is out of the ordinary   Labs on Admission: I have personally reviewed following labs and imaging studies  CBC: Recent Labs  Lab 03/04/21 2130  WBC 8.7  HGB 12.9  HCT 41.1  MCV 88.4  PLT 350   Basic Metabolic Panel: Recent Labs  Lab 03/04/21 2130  NA 136  K 4.6  CL 94*  CO2 29  GLUCOSE 98  BUN 34*  CREATININE 1.53*  CALCIUM 10.0   GFR: Estimated Creatinine Clearance: 42.3 mL/min (A) (by C-G formula based on SCr of 1.53 mg/dL (H)). Liver Function Tests: Recent Labs  Lab 03/04/21 2130  AST 25  ALT 16  ALKPHOS 116  BILITOT 1.2  PROT 7.6  ALBUMIN 3.9   Recent Labs  Lab 03/04/21 2130  LIPASE 135*   No results for input(s): AMMONIA in the last 168 hours. Coagulation Profile: No results for input(s): INR, PROTIME in the last 168 hours. Cardiac  Enzymes: No results for input(s): CKTOTAL, CKMB, CKMBINDEX, TROPONINI in the last 168 hours. BNP (last 3 results) No results for input(s): PROBNP in the last 8760 hours. HbA1C: No results for input(s): HGBA1C in the last 72 hours. CBG: No results for input(s): GLUCAP in the last 168 hours. Lipid Profile: No results for input(s): CHOL, HDL, LDLCALC, TRIG, CHOLHDL, LDLDIRECT in the last 72 hours. Thyroid Function Tests: No results for input(s): TSH, T4TOTAL, FREET4, T3FREE, THYROIDAB in the last 72 hours. Anemia Panel: No results for input(s): VITAMINB12, FOLATE, FERRITIN, TIBC, IRON, RETICCTPCT in the last 72 hours. Urine analysis:    Component Value Date/Time   COLORURINE YELLOW (A) 03/04/2021 2133   APPEARANCEUR CLOUDY (A) 03/04/2021 2133   LABSPEC 1.016 03/04/2021 2133   PHURINE 5.0 03/04/2021 2133   GLUCOSEU NEGATIVE 03/04/2021 2133   HGBUR SMALL (A) 03/04/2021 2133  BILIRUBINUR NEGATIVE 03/04/2021 2133   KETONESUR NEGATIVE 03/04/2021 2133   PROTEINUR NEGATIVE 03/04/2021 2133   UROBILINOGEN 0.2 11/10/2012 1350   NITRITE POSITIVE (A) 03/04/2021 2133   LEUKOCYTESUR LARGE (A) 03/04/2021 2133    Radiological Exams on Admission: CT Head Wo Contrast  Result Date: 03/04/2021 CLINICAL DATA:  Moaning and agitation, back and neck pain EXAM: CT HEAD WITHOUT CONTRAST CT CERVICAL SPINE WITHOUT CONTRAST TECHNIQUE: Multidetector CT imaging of the head and cervical spine was performed following the standard protocol without intravenous contrast. Multiplanar CT image reconstructions of the cervical spine were also generated. COMPARISON:  CT 01/06/2021 FINDINGS: CT HEAD FINDINGS Brain: No evidence of acute infarction, hemorrhage, hydrocephalus, extra-axial collection, visible mass lesion or mass effect. Symmetric prominence of the ventricles, cisterns and sulci compatible with parenchymal volume loss. Patchy areas of Latour matter hypoattenuation are most compatible with chronic microvascular  angiopathy. Vascular: Atherosclerotic calcification of the carotid siphons. No hyperdense vessel. Skull: No calvarial fracture or suspicious osseous lesion. No scalp swelling or hematoma. Sinuses/Orbits: Complete opacification of the right maxillary sinus. Some minimally hyperostotic changes. No worrisome stranding or inflammation along the sinus perimeter. Remaining paranasal sinuses are predominantly clear. Mastoid air cells are well aerated. Middle ear cavities are clear. Orbital structures are unremarkable aside from prior lens extractions. Other: Partly edentulous with mandibular prognathism. CT CERVICAL SPINE FINDINGS Alignment: Straightening of normal cervical lordosis. Degenerative stepwise anterolisthesis C3-C5 and retrolisthesis C5 on C6, unchanged from comparison prior. No evidence of traumatic listhesis. No abnormally widened, perched or jumped facets. Some asymmetric degenerative features along the facets, further detailed below. Normal alignment of the craniocervical and atlantoaxial articulations. Skull base and vertebrae: No acute fracture or traumatic osseous injury of the cervical spine or imaged skull base. The osseous structures appear diffusely demineralized which may limit detection of small or nondisplaced fractures. No worrisome lytic or blastic lesions. Multilevel cervical spondylitic changes including subchondral sclerosis and cystic change at multiple levels, as detailed below. Additional arthrosis about the atlantodental interval. Soft tissues and spinal canal: No pre or paravertebral fluid or swelling. No visible canal hematoma. Airways patent. Cervical carotid atherosclerosis bilaterally. No worrisome adenopathy. Disc levels: Multilevel cervical spondylitic changes are noted. There are multilevel disc osteophyte complexes. In combination with some ligamentum flavum infolding, there is moderate canal stenosis C3-4. More mild canal narrowing is seen C4-5, C5-6 and C6-7. Partial effacement  of the ventral thecal sac C7-T1 as well. Multilevel facet degenerative changes with uncinate spurring and hypertrophic features are most pronounced on the left C1-2, bilaterally C3-4 and on the right C5-6. Findings result in mild-to-moderate multilevel neural foraminal narrowing with more moderate to severe narrowing C3-4. Upper chest: No acute abnormality in the upper chest or imaged lung apices. Other: Normal thyroid. Bilateral shoulder arthroplasties noted on scout view. IMPRESSION: 1. No acute intracranial abnormality. No calvarial fracture or scalp swelling. 2. No cervical spine fracture or traumatic listhesis. 3. Background of microvascular angiopathy, parenchymal volume loss, and both intracranial and cervical carotid atherosclerosis. 4. Opacification the right maxillary sinus with some hyperostotic features suggesting chronicity. 5. Multilevel cervical spondylitic change in facet degenerative features, as detailed above. Findings result in moderate canal stenosis C3-4 with more mild canal narrowing C4-C7 as well as moderate to severe foraminal narrowing C3-4 and mild-to-moderate foraminal narrowing elsewhere throughout the cervical spine. Electronically Signed   By: Lovena Le M.D.   On: 03/04/2021 23:52   CT Angio Chest PE W and/or Wo Contrast  Result Date: 03/04/2021 CLINICAL DATA:  75 year old  female with concern for pulmonary embolism. Abdominal pain. Concern for kidney stone. EXAM: CT ANGIOGRAPHY CHEST CT ABDOMEN AND PELVIS WITH CONTRAST TECHNIQUE: Multidetector CT imaging of the chest was performed using the standard protocol during bolus administration of intravenous contrast. Multiplanar CT image reconstructions and MIPs were obtained to evaluate the vascular anatomy. Multidetector CT imaging of the abdomen and pelvis was performed using the standard protocol during bolus administration of intravenous contrast. CONTRAST:  41m OMNIPAQUE IOHEXOL 350 MG/ML SOLN COMPARISON:  Chest CT dated  10/23/2003 and radiograph dated 03/04/2021. FINDINGS: CTA CHEST FINDINGS Cardiovascular: Mild cardiomegaly. There is coronary vascular calcification. No pericardial effusion. There is mild atherosclerotic calcification of the thoracic aorta. No aneurysmal dilatation or dissection. The origins of the great vessels of the aortic arch appear patent as visualized. Evaluation of the pulmonary arteries is very limited due to severe respiratory motion artifact and suboptimal visualization and evaluation of the peripheral branches. No large central pulmonary artery embolus identified. Mediastinum/Nodes: No hilar or mediastinal adenopathy. The esophagus and the thyroid gland are grossly unremarkable. No mediastinal fluid collection. Lungs/Pleura: Bilateral lower lobe predominant, left greater right streaky and hazy densities may represent edema. Pneumonia is not excluded clinical correlation is recommended. No focal consolidation, pleural effusion, or pneumothorax. The central airways are patent. Musculoskeletal: Bilateral shoulder arthroplasties. No acute osseous pathology. Review of the MIP images confirms the above findings. CT ABDOMEN and PELVIS FINDINGS No intra-abdominal free air or free fluid. Hepatobiliary: The liver is unremarkable. There is intrahepatic biliary ductal dilatation. Cholecystectomy. No retained calcified stone noted in the central CBD. The common bile duct measures approximately 14 mm in diameter. Pancreas: Unremarkable. No pancreatic ductal dilatation or surrounding inflammatory changes. Spleen: Normal in size without focal abnormality. Adrenals/Urinary Tract: There is a 15 mm right adrenal nodule, indeterminate. The left adrenal glands unremarkable. There is no hydronephrosis on either side. The visualized ureters and the urinary bladder appear unremarkable Stomach/Bowel: There is sigmoid diverticulosis without active inflammatory changes. There is no bowel obstruction or active inflammation. The  appendix is not visualized with certainty. No inflammatory changes identified in the right lower quadrant. Vascular/Lymphatic: Advanced aortoiliac atherosclerotic disease. The IVC is unremarkable. No portal venous gas. There is no adenopathy. Reproductive: Hysterectomy. No adnexal masses. Other: None Musculoskeletal: Osteopenia with severe degenerative changes of the spine. L2-L3 disc spacer. No acute osseous pathology. Review of the MIP images confirms the above findings. IMPRESSION: 1. No CT evidence of central pulmonary artery embolus. 2. Bilateral streaky and hazy densities may represent edema. Pneumonia is not excluded clinical correlation is recommended. 3. Sigmoid diverticulosis. No bowel obstruction. 4. Dilatation of the intrahepatic and extrahepatic biliary tree, likely post cholecystectomy. No retained calcified stone noted. MRCP may provide better evaluation, on a nonemergent/outpatient basis, if clinically indicated. 5. Aortic Atherosclerosis (ICD10-I70.0). Electronically Signed   By: AAnner CreteM.D.   On: 03/04/2021 23:56   CT Cervical Spine Wo Contrast  Result Date: 03/04/2021 CLINICAL DATA:  Moaning and agitation, back and neck pain EXAM: CT HEAD WITHOUT CONTRAST CT CERVICAL SPINE WITHOUT CONTRAST TECHNIQUE: Multidetector CT imaging of the head and cervical spine was performed following the standard protocol without intravenous contrast. Multiplanar CT image reconstructions of the cervical spine were also generated. COMPARISON:  CT 01/06/2021 FINDINGS: CT HEAD FINDINGS Brain: No evidence of acute infarction, hemorrhage, hydrocephalus, extra-axial collection, visible mass lesion or mass effect. Symmetric prominence of the ventricles, cisterns and sulci compatible with parenchymal volume loss. Patchy areas of Klingler matter hypoattenuation are most compatible with  chronic microvascular angiopathy. Vascular: Atherosclerotic calcification of the carotid siphons. No hyperdense vessel. Skull: No  calvarial fracture or suspicious osseous lesion. No scalp swelling or hematoma. Sinuses/Orbits: Complete opacification of the right maxillary sinus. Some minimally hyperostotic changes. No worrisome stranding or inflammation along the sinus perimeter. Remaining paranasal sinuses are predominantly clear. Mastoid air cells are well aerated. Middle ear cavities are clear. Orbital structures are unremarkable aside from prior lens extractions. Other: Partly edentulous with mandibular prognathism. CT CERVICAL SPINE FINDINGS Alignment: Straightening of normal cervical lordosis. Degenerative stepwise anterolisthesis C3-C5 and retrolisthesis C5 on C6, unchanged from comparison prior. No evidence of traumatic listhesis. No abnormally widened, perched or jumped facets. Some asymmetric degenerative features along the facets, further detailed below. Normal alignment of the craniocervical and atlantoaxial articulations. Skull base and vertebrae: No acute fracture or traumatic osseous injury of the cervical spine or imaged skull base. The osseous structures appear diffusely demineralized which may limit detection of small or nondisplaced fractures. No worrisome lytic or blastic lesions. Multilevel cervical spondylitic changes including subchondral sclerosis and cystic change at multiple levels, as detailed below. Additional arthrosis about the atlantodental interval. Soft tissues and spinal canal: No pre or paravertebral fluid or swelling. No visible canal hematoma. Airways patent. Cervical carotid atherosclerosis bilaterally. No worrisome adenopathy. Disc levels: Multilevel cervical spondylitic changes are noted. There are multilevel disc osteophyte complexes. In combination with some ligamentum flavum infolding, there is moderate canal stenosis C3-4. More mild canal narrowing is seen C4-5, C5-6 and C6-7. Partial effacement of the ventral thecal sac C7-T1 as well. Multilevel facet degenerative changes with uncinate spurring and  hypertrophic features are most pronounced on the left C1-2, bilaterally C3-4 and on the right C5-6. Findings result in mild-to-moderate multilevel neural foraminal narrowing with more moderate to severe narrowing C3-4. Upper chest: No acute abnormality in the upper chest or imaged lung apices. Other: Normal thyroid. Bilateral shoulder arthroplasties noted on scout view. IMPRESSION: 1. No acute intracranial abnormality. No calvarial fracture or scalp swelling. 2. No cervical spine fracture or traumatic listhesis. 3. Background of microvascular angiopathy, parenchymal volume loss, and both intracranial and cervical carotid atherosclerosis. 4. Opacification the right maxillary sinus with some hyperostotic features suggesting chronicity. 5. Multilevel cervical spondylitic change in facet degenerative features, as detailed above. Findings result in moderate canal stenosis C3-4 with more mild canal narrowing C4-C7 as well as moderate to severe foraminal narrowing C3-4 and mild-to-moderate foraminal narrowing elsewhere throughout the cervical spine. Electronically Signed   By: Lovena Le M.D.   On: 03/04/2021 23:52   CT ABDOMEN PELVIS W CONTRAST  Result Date: 03/04/2021 CLINICAL DATA:  75 year old female with concern for pulmonary embolism. Abdominal pain. Concern for kidney stone. EXAM: CT ANGIOGRAPHY CHEST CT ABDOMEN AND PELVIS WITH CONTRAST TECHNIQUE: Multidetector CT imaging of the chest was performed using the standard protocol during bolus administration of intravenous contrast. Multiplanar CT image reconstructions and MIPs were obtained to evaluate the vascular anatomy. Multidetector CT imaging of the abdomen and pelvis was performed using the standard protocol during bolus administration of intravenous contrast. CONTRAST:  33m OMNIPAQUE IOHEXOL 350 MG/ML SOLN COMPARISON:  Chest CT dated 10/23/2003 and radiograph dated 03/04/2021. FINDINGS: CTA CHEST FINDINGS Cardiovascular: Mild cardiomegaly. There is  coronary vascular calcification. No pericardial effusion. There is mild atherosclerotic calcification of the thoracic aorta. No aneurysmal dilatation or dissection. The origins of the great vessels of the aortic arch appear patent as visualized. Evaluation of the pulmonary arteries is very limited due to severe respiratory motion artifact and  suboptimal visualization and evaluation of the peripheral branches. No large central pulmonary artery embolus identified. Mediastinum/Nodes: No hilar or mediastinal adenopathy. The esophagus and the thyroid gland are grossly unremarkable. No mediastinal fluid collection. Lungs/Pleura: Bilateral lower lobe predominant, left greater right streaky and hazy densities may represent edema. Pneumonia is not excluded clinical correlation is recommended. No focal consolidation, pleural effusion, or pneumothorax. The central airways are patent. Musculoskeletal: Bilateral shoulder arthroplasties. No acute osseous pathology. Review of the MIP images confirms the above findings. CT ABDOMEN and PELVIS FINDINGS No intra-abdominal free air or free fluid. Hepatobiliary: The liver is unremarkable. There is intrahepatic biliary ductal dilatation. Cholecystectomy. No retained calcified stone noted in the central CBD. The common bile duct measures approximately 14 mm in diameter. Pancreas: Unremarkable. No pancreatic ductal dilatation or surrounding inflammatory changes. Spleen: Normal in size without focal abnormality. Adrenals/Urinary Tract: There is a 15 mm right adrenal nodule, indeterminate. The left adrenal glands unremarkable. There is no hydronephrosis on either side. The visualized ureters and the urinary bladder appear unremarkable Stomach/Bowel: There is sigmoid diverticulosis without active inflammatory changes. There is no bowel obstruction or active inflammation. The appendix is not visualized with certainty. No inflammatory changes identified in the right lower quadrant.  Vascular/Lymphatic: Advanced aortoiliac atherosclerotic disease. The IVC is unremarkable. No portal venous gas. There is no adenopathy. Reproductive: Hysterectomy. No adnexal masses. Other: None Musculoskeletal: Osteopenia with severe degenerative changes of the spine. L2-L3 disc spacer. No acute osseous pathology. Review of the MIP images confirms the above findings. IMPRESSION: 1. No CT evidence of central pulmonary artery embolus. 2. Bilateral streaky and hazy densities may represent edema. Pneumonia is not excluded clinical correlation is recommended. 3. Sigmoid diverticulosis. No bowel obstruction. 4. Dilatation of the intrahepatic and extrahepatic biliary tree, likely post cholecystectomy. No retained calcified stone noted. MRCP may provide better evaluation, on a nonemergent/outpatient basis, if clinically indicated. 5. Aortic Atherosclerosis (ICD10-I70.0). Electronically Signed   By: Anner Crete M.D.   On: 03/04/2021 23:56   DG Chest Portable 1 View  Result Date: 03/04/2021 CLINICAL DATA:  Altered mental status EXAM: PORTABLE CHEST 1 VIEW COMPARISON:  01/06/2021 FINDINGS: Cardiac shadow is at the upper limits of normal in size. CardioMEMS device is again seen and stable. The lungs are well aerated bilaterally. Mild central vascular congestion is seen without interstitial edema. No focal infiltrate is noted. Bilateral shoulder replacements are seen. IMPRESSION: Mild vascular congestion without interstitial edema. Electronically Signed   By: Inez Catalina M.D.   On: 03/04/2021 21:39     Assessment/Plan 75 year old female with history of diastolic heart failure on nighttime oxygen, stage IIIa CKD, morbid obesity, hypothyroidism, COPD on nighttime oxygen, presenting with acute confusion and bizarre behavior    Acute metabolic encephalopathy -Secondary to UTI given that at baseline patient functions independently, now confused needing assistance - Uncertain whether patient has underlying cognitive  deficit - Head CT with no acute findings - Neurologic checks - Fall and aspiration precautions    UTI (urinary tract infection) - Urinalysis with pyuria - Continue Rocephin - Follow cultures    Acute on chronic diastolic CHF    Acute respiratory failure with hypoxia (HCC) - Patient tachypneic to the 30s on arrival requiring 6 L to maintain sats 90-92 - CTA chest negative for acute PE - Chest x-ray with pulmonary vascular congestion - Continue supplemental oxygen.  At baseline wears nighttime O2 - Continue supplemental oxygen - IV Lasix - Continue Coreg.  Not currently on ACE/ARB - Echocardiogram in the  a.m. - Daily weights with intake and output monitoring - Cardiology consult    Elevated lipase - Lipase elevated to 135 - CT abdomen and pelvis shows post cholecystectomy changes    Stage 3a chronic kidney disease (HCC) - Creatinine 1.53 at baseline    Obesity, Class III, BMI 40-49.9 (morbid obesity) (HCC) - Complicating factor to overall prognosis and care    COPD (chronic obstructive pulmonary disease) ([place]) - DuoNebs as needed and continue home inhalers pending verification    DVT prophylaxis: Lovenox  Code Status: full code  Family Communication: Daughter at bedside Disposition Plan: Back to previous home environment Consults called: Cardiology Status:At the time of admission, it appears that the appropriate admission status for this patient is INPATIENT. This is judged to be reasonable and necessary in order to provide the required intensity of service to ensure the patient's safety given the presenting symptoms, physical exam findings, and initial radiographic and laboratory data in the context of their  Comorbid conditions.   Patient requires inpatient status due to high intensity of service, high risk for further deterioration and high frequency of surveillance required.   I certify that at the point of admission it is my clinical judgment that the patient will  require inpatient hospital care spanning beyond [place] MD [facility]     03/05/2021, 2:13 AM

## 2021-03-05 NOTE — Evaluation (Signed)
Clinical/Bedside Swallow Evaluation Patient Details  Name: [name] MRN: [id number] Date of Birth: Feb 06, 1946  Today's Date: 03/05/2021 Time: SLP Start Time (ACUTE ONLY): 13 SLP Stop Time (ACUTE ONLY): 1330 SLP Time Calculation (min) (ACUTE ONLY): 60 min  Past Medical History:  Past Medical History:  Diagnosis Date   Anemia    Anxiety    Arthritis    "hands, feet" (03/03/2017)   Brain lesion    2 types   Cataract    Cervical spondylosis with myelopathy    Chest pain    Normal cardiac cath 5/09   Chronic lower back pain    CKD (chronic kidney disease), stage III ([place])    [name]   Congestive heart failure (CHF) (HCC)    has a Cardiomems implant    Constipation    Dementia ([place])    Depression    Dumping syndrome    Dyspnea    with activity   Edema    Fatigue    Fibromyalgia    GERD (gastroesophageal reflux disease)    Headache    "w/high CBG" (03/03/2017)   History of echocardiogram    a. Echo 03/20/16 (done at [facility] in [place], [place]):  mild LVH, EF 71%, normal diastolic function, mild LAE, MAC, RVSP 25 mmHg   Hyperlipidemia    Hypertension    Hypothyroidism    Lumbar spondylosis    Lymphedema    seeing specialist for this   Migraine    "mostly stopped when I changed my diet" (03/03/2017)   OSA on CPAP    Pneumonia X 1   PONV (postoperative nausea and vomiting)    Restless leg syndrome    Rheumatoid arthritis ([place])    Stroke ([place])    TIAs "mini strokes"- unsure of last TIA - pt and daughter deny this   Syncope    Tremors of nervous system    Type II diabetes mellitus ([place])    Past Surgical History:  Past Surgical History:  Procedure Laterality Date   APPENDECTOMY  1966   BACK SURGERY     CARDIAC CATHETERIZATION N/A 05/25/2016   Procedure: Right/Left Heart Cath and Coronary Angiography;  Surgeon: Larey Dresser, MD;  Location: [facility];  Service: Cardiovascular;  Laterality: N/A;   CATARACT EXTRACTION W/  INTRAOCULAR LENS  IMPLANT, BILATERAL Bilateral    COLONOSCOPY     CORONARY ANGIOGRAM  2009   Normal coronaries   EXCISION/RELEASE BURSA HIP Bilateral    I & D KNEE WITH POLY EXCHANGE Left 03/02/2017   Procedure: Left knee Revision of poly liner;  Surgeon: Mcarthur Rossetti, MD;  Location: [facility];  Service: Orthopedics;  Laterality: Left;   JOINT REPLACEMENT     KNEE ARTHROSCOPY Bilateral    LAPAROSCOPIC CHOLECYSTECTOMY  2015   LMF  2017   CardiMEMS HF implant for CHF (measures amount of fluid in the heart)   LUMBAR DISC SURGERY     LUMBAR LAMINECTOMY/DECOMPRESSION MICRODISCECTOMY Left 12/2005   L2-3 laminectomy and diskectomy/notes 01/06/2011   POSTERIOR LUMBAR FUSION  04/2006   [name] 01/06/2011; "put cages in"   REVERSE SHOULDER ARTHROPLASTY Left 06/15/2017   Procedure: REVERSE LEFT SHOULDER ARTHROPLASTY;  Surgeon: Meredith Pel, MD;  Location: [facility];  Service: Orthopedics;  Laterality: Left;   REVERSE SHOULDER ARTHROPLASTY Right 02/01/2018   REVERSE SHOULDER ARTHROPLASTY Right 02/01/2018   Procedure: RIGHT REVERSE SHOULDER ARTHROPLASTY;  Surgeon: Meredith Pel, MD;  Location: [facility];  Service: Orthopedics;  Laterality: Right;   REVISION TOTAL KNEE ARTHROPLASTY Left 03/02/2017   poly liner/notes 03/02/2017   RIGHT HEART CATH N/A 11/13/2016   Procedure: Right Heart Cath with Cardiomems;  Surgeon: Larey Dresser, MD;  Location: [facility];  Service: Cardiovascular;  Laterality: N/A;   SHOULDER ARTHROSCOPY WITH ROTATOR CUFF REPAIR Right    SHOULDER OPEN ROTATOR CUFF REPAIR Left 01/2011   [name] 01/29/2011   TOTAL ABDOMINAL HYSTERECTOMY     TOTAL KNEE ARTHROPLASTY Left    TOTAL KNEE ARTHROPLASTY Right 11/18/2012   Procedure: TOTAL KNEE ARTHROPLASTY;  Surgeon: Yvette Rack., MD;  Location: [facility];  Service: Orthopedics;  Laterality: Right;   TUBAL LIGATION     UPPER GASTROINTESTINAL ENDOSCOPY     UPPER GI ENDOSCOPY     HPI:  Pt is a 75 y.o. female with medical  history significant for Multiple medical issues including Diastolic heart failure on nighttime oxygen, stage IIIa CKD, morbid obesity, dumping syndrome, Dementia, GERD w/ Esophageal Dilation, hypothyroidism, COPD on nighttime oxygen, who resides at an assisted living facility and was in her usual state of health earlier in the day but then later on became confused to where she could not recognize her daughter.  On arrival to the emergency room she was tearful and yelling out per ED providers report.  Pt was admitted w/ acute encephalopathy, UTI.  CXR: "Mild vascular congestion without interstitial edema".  Head CT: no acute abnormalities.   Assessment / Plan / Recommendation Clinical Impression  Pt's mental status appears to have returned to her Baseline, as per pt and Daughter present in room. Pt verbally engages in conversation and answers basic questions re; self appropriately, follows basic commands. Pt appears to present w/ adequate oropharyngeal phase swallow function w/ No oropharyngeal phase dysphagia noted, No neuromuscular deficits noted. Pt consumed po trials w/ No overt, clinical s/s of aspiration during po trials. Pt appears at reduced risk for aspiration following general aspiration precautions.   During po trials, pt consumed all consistencies w/ no overt coughing, decline in vocal quality, or change in respiratory presentation during/post trials. Oral phase appeared [facility] w/ timely bolus management, mastication, and control of bolus propulsion for A-P transfer for swallowing. Oral clearing achieved w/ all trial consistencies. OM Exam appeared [facility] w/ no unilateral weakness noted. Pt is Missing Most Bottom Dentition; has Upper Denture plate. She stated she is able to eat "regular foods" Baseline at [facility] but avoids some heavier meats and breads d/t Esophageal Dilation ~1 year ago(per Dtr). Speech Clear. Pt fed self w/ setup support.   Recommend a Regular consistency diet w/ well-Cut meats, moistened  foods; Thin liquids VIA CUP - pt does not use straws at home. Recommend general aspiration precautions, Pills WHOLE in Puree for safer, easier swallowing as pt described Larger pills causing difficulty to swallow. Education given on Pills in Puree; food consistencies and easy to eat options; general aspiration precautions. NSG to reconsult if any new needs arise. Pt and Dtr agreed. NSG agreed. SLP Visit Diagnosis: Dysphagia, unspecified (R13.10)    Aspiration Risk   (reduced following general precautions)    Diet Recommendation  Regular consistency diet w/ well-Cut meats, moistened foods; Thin liquids VIA CUP - pt does not use straws at home. Recommend general aspiration precautions, tray setup(!)  and support at meals as needed.   Medication Administration: Whole meds with puree (for ease, safety of swallowing)    Other  Recommendations Recommended Consults:  (Dietician f/u as needed) Oral  Care Recommendations: Oral care BID;Staff/trained caregiver to provide oral care (Denture plate) Other Recommendations:  (n/a)   Follow up Recommendations None      Frequency and Duration  (n/a)   (n/a)       Prognosis Prognosis for Safe Diet Advancement: Good Barriers to Reach Goals:  (n/a)      Swallow Study   General Date of Onset: 03/04/21 HPI: Pt is a 75 y.o. female with medical history significant for Multiple medical issues including Diastolic heart failure on nighttime oxygen, stage IIIa CKD, morbid obesity, dumping syndrome, Dementia, GERD w/ Esophageal Dilation, hypothyroidism, COPD on nighttime oxygen, who resides at an assisted living facility and was in her usual state of health earlier in the day but then later on became confused to where she could not recognize her daughter.  On arrival to the emergency room she was tearful and yelling out per ED providers report.  Pt was admitted w/ acute encephalopathy, UTI.  CXR: "Mild vascular congestion without interstitial edema".  Head CT: no  acute abnormalities. Type of Study: Bedside Swallow Evaluation Previous Swallow Assessment: none Diet Prior to this Study: NPO (regular diet at home) Temperature Spikes Noted: No (wbc 7.8) Respiratory Status: Nasal cannula (6L) History of Recent Intubation: No Behavior/Cognition: Alert;Cooperative;Pleasant mood;Distractible;Requires cueing (Dtr present in room) Oral Cavity Assessment: Within Functional Limits Oral Care Completed by SLP: Recent completion by staff Oral Cavity - Dentition: Dentures, top;Missing dentition (on Bottom) Vision: Functional for self-feeding Self-Feeding Abilities: Able to feed self;Needs assist;Needs set up Patient Positioning: Upright in bed (needed positioning support) Baseline Vocal Quality: Normal Volitional Cough: Strong Volitional Swallow: Able to elicit    Oral/Motor/Sensory Function Overall Oral Motor/Sensory Function: Within functional limits   Ice Chips Ice chips: Within functional limits Presentation: Spoon (fed; 3 trials)   Thin Liquid Thin Liquid: Within functional limits Presentation: Cup;Self Fed (~6 ozs total) Other Comments: does not use straws baseline    Nectar Thick Nectar Thick Liquid: Not tested   Honey Thick Honey Thick Liquid: Not tested   Puree Puree: Within functional limits Presentation: Self Fed;Spoon (~4 ozs)   Solid     Solid: Within functional limits Presentation: Spoon;Self Fed (8 trials)        Orinda Kenner, MS, CCC-SLP Speech Language Pathologist Re[facility] [phone] Quanita Barona 03/05/2021,4:41 PM

## 2021-03-05 NOTE — ED Notes (Signed)
Pt is unable to take anything PO at this moment

## 2021-03-05 NOTE — Consult Note (Signed)
Cardiology Consultation:   Patient ID: [name] MRN: [id number]; DOB: [birth year]  Admit date: 03/04/2021 Date of Consult: 03/05/2021  PCP:  System, Provider Not In   [facility] Providers Cardiologist:  None  Advanced Heart Failure:  Loralie Champagne, MD       Patient Profile:   [name] is a 75 y.o. female with a hx of HFpEF, obesity who is being seen 03/05/2021 for the evaluation of heart failure at the request of Dr. Jimmye Norman.  History of Present Illness:   [name] is a 75 year old female with history of CKD hypertension, HFpEF, morbid obesity, lymphedema, altered mental status.  Patient lives in a skilled nursing facility.  She reported being well as per daughter yesterday morning.  Later on during the day, skilled nursing facility staff noticed patient being altered.  Her sentences were incoherent.  Patient also apparently looked altered.  EMS was called and patient brought to the emergency room.  In the ED, head CT was unrevealing, chest CT showed some pulmonary congestion and questionable pneumonia.  UA was consistent with UTI, patient started on antibiotics to cover both urine and lung.  Started on IV Lasix due to history of HFpEF.  Daughter at bedside today, states mother looks and is feeling much better compared to yesterday.  She denies chest pain or shortness of breath.  Has lost about 45 pounds over the past 6 months.  Working closely with dietitian.  She is currently n.p.o. pending swallow eval.  Being followed closely by heart failure clinic, had a CardioMEMS device placed 10/2016.  Currently takes torsemide 80 mg daily.  Has an appointment with heart failure clinic in 5 days.   Past Medical History:  Diagnosis Date   Anemia    Anxiety    Arthritis    "hands, feet" (03/03/2017)   Brain lesion    2 types   Cataract    Cervical spondylosis with myelopathy    Chest pain    Normal cardiac cath 5/09   Chronic lower back pain    CKD (chronic kidney  disease), stage III ([place])    [name]   Congestive heart failure (CHF) (HCC)    has a Cardiomems implant    Constipation    Dementia ([place])    Depression    Dumping syndrome    Dyspnea    with activity   Edema    Fatigue    Fibromyalgia    GERD (gastroesophageal reflux disease)    Headache    "w/high CBG" (03/03/2017)   History of echocardiogram    a. Echo 03/20/16 (done at [facility] in [place], [place]):  mild LVH, EF 87%, normal diastolic function, mild LAE, MAC, RVSP 25 mmHg   Hyperlipidemia    Hypertension    Hypothyroidism    Lumbar spondylosis    Lymphedema    seeing specialist for this   Migraine    "mostly stopped when I changed my diet" (03/03/2017)   OSA on CPAP    Pneumonia X 1   PONV (postoperative nausea and vomiting)    Restless leg syndrome    Rheumatoid arthritis ([place])    Stroke ([place])    TIAs "mini strokes"- unsure of last TIA - pt and daughter deny this   Syncope    Tremors of nervous system    Type II diabetes mellitus ([place])     Past Surgical History:  Procedure Laterality Date   APPENDECTOMY  1966   BACK SURGERY  CARDIAC CATHETERIZATION N/A 05/25/2016   Procedure: Right/Left Heart Cath and Coronary Angiography;  Surgeon: Larey Dresser, MD;  Location: [place] [facility];  Service: Cardiovascular;  Laterality: N/A;   CATARACT EXTRACTION W/ INTRAOCULAR LENS  IMPLANT, BILATERAL Bilateral    COLONOSCOPY     CORONARY ANGIOGRAM  2009   Normal coronaries   EXCISION/RELEASE BURSA HIP Bilateral    I & D KNEE WITH POLY EXCHANGE Left 03/02/2017   Procedure: Left knee Revision of poly liner;  Surgeon: Mcarthur Rossetti, MD;  Location: [place];  Service: Orthopedics;  Laterality: Left;   JOINT REPLACEMENT     KNEE ARTHROSCOPY Bilateral    LAPAROSCOPIC CHOLECYSTECTOMY  2015   LMF  2017   CardiMEMS HF implant for CHF (measures amount of fluid in the heart)   LUMBAR DISC SURGERY     LUMBAR LAMINECTOMY/DECOMPRESSION MICRODISCECTOMY Left  12/2005   L2-3 laminectomy and diskectomy/notes 01/06/2011   POSTERIOR LUMBAR FUSION  04/2006   [name] 01/06/2011; "put cages in"   REVERSE SHOULDER ARTHROPLASTY Left 06/15/2017   Procedure: REVERSE LEFT SHOULDER ARTHROPLASTY;  Surgeon: Meredith Pel, MD;  Location: [place];  Service: Orthopedics;  Laterality: Left;   REVERSE SHOULDER ARTHROPLASTY Right 02/01/2018   REVERSE SHOULDER ARTHROPLASTY Right 02/01/2018   Procedure: RIGHT REVERSE SHOULDER ARTHROPLASTY;  Surgeon: Meredith Pel, MD;  Location: [place];  Service: Orthopedics;  Laterality: Right;   REVISION TOTAL KNEE ARTHROPLASTY Left 03/02/2017   poly liner/notes 03/02/2017   RIGHT HEART CATH N/A 11/13/2016   Procedure: Right Heart Cath with Cardiomems;  Surgeon: Larey Dresser, MD;  Location: [place] [facility];  Service: Cardiovascular;  Laterality: N/A;   SHOULDER ARTHROSCOPY WITH ROTATOR CUFF REPAIR Right    SHOULDER OPEN ROTATOR CUFF REPAIR Left 01/2011   [name] 01/29/2011   TOTAL ABDOMINAL HYSTERECTOMY     TOTAL KNEE ARTHROPLASTY Left    TOTAL KNEE ARTHROPLASTY Right 11/18/2012   Procedure: TOTAL KNEE ARTHROPLASTY;  Surgeon: Yvette Rack., MD;  Location: [place];  Service: Orthopedics;  Laterality: Right;   TUBAL LIGATION     UPPER GASTROINTESTINAL ENDOSCOPY     UPPER GI ENDOSCOPY       Home Medications:  Prior to Admission medications   Medication Sig Start Date End Date Taking? Authorizing Provider  acetaminophen (TYLENOL) 650 MG CR tablet Take 650 mg by mouth every 4 (four) hours as needed for pain.   Yes [provider]  albuterol (VENTOLIN HFA) 108 (90 Base) MCG/ACT inhaler Inhale 2 puffs into the lungs every 4 (four) hours as needed. 02/26/21  Yes [provider]  amLODipine (NORVASC) 2.5 MG tablet Take 2.5 mg by mouth daily.   Yes [provider]  aspirin EC 81 MG tablet Take 1 tablet (81 mg total) by mouth daily. 11/29/17  Yes Larey Dresser, MD  atorvastatin (LIPITOR) 40 MG tablet Take  40 mg by mouth daily.   Yes [provider]  baclofen (LIORESAL) 10 MG tablet Take 10 mg by mouth 3 (three) times daily. (0900, 1400, & 2100)   Yes [provider]  carvedilol (COREG) 6.25 MG tablet Take 6.25 mg by mouth 2 (two) times daily with a meal.   Yes [provider]  clopidogrel (PLAVIX) 75 MG tablet Take 75 mg by mouth daily. 02/26/21  Yes [provider]  docusate sodium (COLACE) 100 MG capsule Take 100 mg by mouth 2 (two) times daily.   Yes [provider]  donepezil (ARICEPT)  10 MG tablet Take 10 mg by mouth daily. 02/24/21  Yes [provider]  DULoxetine (CYMBALTA) 60 MG capsule Take 120 mg by mouth daily. 02/27/21  Yes [provider]  gabapentin (NEURONTIN) 800 MG tablet Take 800 mg by mouth 3 (three) times daily. 03/03/21  Yes [provider]  KLOR-CON M20 20 MEQ tablet Take 1 tablet by mouth every other day. 03/03/21  Yes [provider]  lamoTRIgine (LAMICTAL) 100 MG tablet Take 100 mg by mouth 2 (two) times daily. (0900 & 2100) 11/03/14  Yes [provider]  levothyroxine (SYNTHROID, LEVOTHROID) 88 MCG tablet Take 88 mcg by mouth daily before breakfast. (0900)   Yes [provider]  lidocaine (LIDODERM) 5 % Place 1 patch onto the skin daily. Remove patch at 1900   Yes [provider]  Melatonin 3 MG CAPS Take 3 mg by mouth at bedtime.   Yes [provider]  mometasone (NASONEX) 50 MCG/ACT nasal spray Place 1 spray into the nose daily. 02/05/21  Yes [provider]  promethazine (PHENERGAN) 12.5 MG tablet Take 1 tablet by mouth 3 (three) times daily as needed.   Yes [provider]  rOPINIRole (REQUIP) 3 MG tablet Take 3 mg by mouth 3 (three) times daily.   Yes [provider]  sennosides-docusate sodium (SENOKOT-S) 8.6-50 MG tablet Take 1 tablet by mouth daily.   Yes [provider]  Skin Protectants, Misc. (BASIS FACIAL MOISTURIZER) CREA  Apply 1 application topically daily.   Yes [provider]  torsemide (DEMADEX) 20 MG tablet Take 80 mg by mouth daily.   Yes [provider]  traZODone (DESYREL) 100 MG tablet Take 100 mg by mouth at bedtime.   Yes [provider]  antiseptic oral rinse (BIOTENE) LIQD 10 mLs by Mouth Rinse route 4 (four) times daily. (0900, 1000, 1200 & 2100) Patient not taking: Reported on 03/05/2021    [provider]  budesonide (PULMICORT) 0.5 MG/2ML nebulizer solution Take 0.5 mg by nebulization 2 (two) times daily. (1000 & 2200) Patient not taking: Reported on 03/05/2021    [provider]  Cholecalciferol (VITAMIN D3) 1000 units CAPS Take 1,000 Units by mouth daily. (0900) Patient not taking: Reported on 03/05/2021    [provider]  Cyanocobalamin (VITAMIN B-12 IJ) Inject 1,000 mcg as directed every 30 (thirty) days. Weekly for 4 weeks, has 1 more dose this week (week of 7/2) then will switch to once a month  Patient not taking: Reported on 03/05/2021    [provider]  erythromycin ophthalmic ointment 1 application at bedtime. Patient not taking: Reported on 03/05/2021    [provider]  HYDROcodone-acetaminophen (NORCO) 7.5-325 MG tablet Take 1 tablet by mouth every 6 (six) hours as needed.  Patient not taking: Reported on 03/05/2021 05/09/18   [provider]  ipratropium-albuterol (DUONEB) 0.5-2.5 (3) MG/3ML SOLN Take 3 mLs by nebulization every 6 (six) hours as needed (FOR WHEEZING/SHORTNESS OF BREATH). Patient not taking: Reported on 03/05/2021    [provider]  loratadine (CLARITIN) 10 MG tablet Take 10 mg by mouth daily. Patient not taking: Reported on 03/05/2021    [provider]  omeprazole (PRILOSEC) 20 MG capsule Take 20 mg by mouth 2 (two) times daily before a meal. (0900 & 1700) Patient not taking: Reported on 03/05/2021    [provider]  oxyCODONE-acetaminophen (PERCOCET/ROXICET)  5-325 MG tablet Take 1 tablet by mouth every 4 (four) hours as needed for severe pain. Patient  not taking: Reported on 03/05/2021 12/03/20   Paulette Blanch, MD  sucralfate (CARAFATE) 1 GM/10ML suspension Take 1 g by mouth 4 (four) times daily -  with meals and at bedtime. Patient not taking: Reported on 03/05/2021    [provider]    Inpatient Medications: Scheduled Meds:  carvedilol  6.25 mg Oral BID WC   enoxaparin (LOVENOX) injection  0.5 mg/kg Subcutaneous Q24H   furosemide  40 mg Intravenous Q12H   Continuous Infusions:  [START ON 03/06/2021] cefTRIAXone (ROCEPHIN)  IV     PRN Meds: acetaminophen **OR** acetaminophen, morphine injection, ondansetron **OR** ondansetron (ZOFRAN) IV  Allergies:   No Known Allergies  Social History:   Social History   Socioeconomic History   Marital status: Widowed    Spouse name: Not on file   Number of children: 3   Years of education: Not on file   Highest education level: Not on file  Occupational History   Occupation: retired  Tobacco Use   Smoking status: Never   Smokeless tobacco: Never  Vaping Use   Vaping Use: Never used  Substance and Sexual Activity   Alcohol use: No    Alcohol/week: 0.0 standard drinks   Drug use: No   Sexual activity: Not Currently  Other Topics Concern   Not on file  Social History Narrative   Pt does use caffeine. Lives alone. 3 children. Retired.   Social Determinants of Health   Financial Resource Strain: Not on file  Food Insecurity: Not on file  Transportation Needs: Not on file  Physical Activity: Not on file  Stress: Not on file  Social Connections: Not on file  Intimate Partner Violence: Not on file    Family History:    Family History  Problem Relation Age of Onset   Heart disease Mother    Heart failure Mother    Kidney disease Mother    Lung cancer Father    Colon cancer Brother    Cancer Brother        malignant thymoma   Heart disease Brother    Tremor Maternal  Grandmother 90   Ovarian cancer Maternal Grandmother    Uterine cancer Maternal Grandmother    Irritable bowel syndrome Daughter    Esophageal cancer Neg Hx    Stomach cancer Neg Hx    Inflammatory bowel disease Neg Hx    Liver disease Neg Hx    Pancreatic cancer Neg Hx    Rectal cancer Neg Hx      ROS:  Please see the history of present illness.   All other ROS reviewed and negative.     Physical Exam/Data:   Vitals:   03/05/21 0400 03/05/21 0551 03/05/21 0725 03/05/21 1200  BP: (!) 100/48  (!) 144/61 130/62  Pulse: 64 65 66   Resp: _0 Temp:   98.1 F (36.7 C)   TempSrc:   Axillary   SpO2: 100% 95% 98%   Weight:      Height:        Intake/Output Summary (Last 24 hours) at 03/05/2021 1321 Last data filed at 03/05/2021 1000 Gross per 24 hour  Intake 106.67 ml  Output 600 ml  Net -493.33 ml   Last 3 Weights 03/04/2021 01/06/2021 12/03/2020  Weight (lbs) 275 lb 9.2 oz 257 lb 15 oz 258 lb  Weight (kg) 125 kg 117 kg 117.028 kg     Body mass index is 44.48 kg/m.  General:  Well nourished, well developed,  in no acute distress HEENT: normal Lymph: no adenopathy Neck: no JVD Endocrine:  No thryomegaly Vascular: No carotid bruits; FA pulses 2+ bilaterally without bruits  Cardiac:  normal S1, S2; RRR; distant heart sounds Lungs: Diminished breath sounds at bases Abd: soft, nontender, no hepatomegaly  Ext: Lymph edema present Musculoskeletal:  No deformities, BUE and BLE strength normal and equal Skin: warm and dry  Neuro:  CNs 2-12 intact, no focal abnormalities noted Psych:  Normal affect   EKG:  The EKG was personally reviewed and demonstrates: Sinus rhythm Telemetry:  Telemetry was personally reviewed and demonstrates: Sinus rhythm  Relevant CV Studies: Echo 03/05/2021 1. Left ventricular ejection fraction, by estimation, is 60 to 65%. The  left ventricle has normal function. The left ventricle has no regional  wall motion abnormalities. There is mild  left ventricular hypertrophy.  Left ventricular diastolic parameters  are consistent with Grade I diastolic dysfunction (impaired relaxation).   2. Right ventricular systolic function is normal. The right ventricular  size is normal.   3. The mitral valve is normal in structure. No evidence of mitral valve  regurgitation.   4. The aortic valve is tricuspid. Aortic valve regurgitation is not  visualized. Mild aortic valve sclerosis is present, with no evidence of  aortic valve stenosis.   5. The inferior vena cava is normal in size with greater than 50%  respiratory variability, suggesting right atrial pressure of 3 mmHg.   Laboratory Data:  High Sensitivity Troponin:   Recent Labs  Lab 03/04/21 2130 03/05/21 0058  TROPONINIHS 15 17     Chemistry Recent Labs  Lab 03/04/21 2130 03/05/21 0058  NA 136  --   K 4.6  --   CL 94*  --   CO2 29  --   GLUCOSE 98  --   BUN 34*  --   CREATININE 1.53* 1.60*  CALCIUM 10.0  --   GFRNONAA 35* 33*  ANIONGAP 13  --     Recent Labs  Lab 03/04/21 2130  PROT 7.6  ALBUMIN 3.9  AST 25  ALT 16  ALKPHOS 116  BILITOT 1.2   Hematology Recent Labs  Lab 03/04/21 2130 03/05/21 0058  WBC 8.7 7.8  RBC 4.65 4.17  HGB 12.9 11.8*  HCT 41.1 37.6  MCV 88.4 90.2  MCH 27.7 28.3  MCHC 31.4 31.4  RDW 17.4* 17.7*  PLT 221 224   BNPNo results for input(s): BNP, PROBNP in the last 168 hours.  DDimer No results for input(s): DDIMER in the last 168 hours.   Radiology/Studies:  CT Head Wo Contrast  Result Date: 03/04/2021 CLINICAL DATA:  Moaning and agitation, back and neck pain EXAM: CT HEAD WITHOUT CONTRAST CT CERVICAL SPINE WITHOUT CONTRAST TECHNIQUE: Multidetector CT imaging of the head and cervical spine was performed following the standard protocol without intravenous contrast. Multiplanar CT image reconstructions of the cervical spine were also generated. COMPARISON:  CT 01/06/2021 FINDINGS: CT HEAD FINDINGS Brain: No evidence of acute  infarction, hemorrhage, hydrocephalus, extra-axial collection, visible mass lesion or mass effect. Symmetric prominence of the ventricles, cisterns and sulci compatible with parenchymal volume loss. Patchy areas of Hay matter hypoattenuation are most compatible with chronic microvascular angiopathy. Vascular: Atherosclerotic calcification of the carotid siphons. No hyperdense vessel. Skull: No calvarial fracture or suspicious osseous lesion. No scalp swelling or hematoma. Sinuses/Orbits: Complete opacification of the right maxillary sinus. Some minimally hyperostotic changes. No worrisome stranding or inflammation along the sinus perimeter. Remaining paranasal sinuses are predominantly clear.  Mastoid air cells are well aerated. Middle ear cavities are clear. Orbital structures are unremarkable aside from prior lens extractions. Other: Partly edentulous with mandibular prognathism. CT CERVICAL SPINE FINDINGS Alignment: Straightening of normal cervical lordosis. Degenerative stepwise anterolisthesis C3-C5 and retrolisthesis C5 on C6, unchanged from comparison prior. No evidence of traumatic listhesis. No abnormally widened, perched or jumped facets. Some asymmetric degenerative features along the facets, further detailed below. Normal alignment of the craniocervical and atlantoaxial articulations. Skull base and vertebrae: No acute fracture or traumatic osseous injury of the cervical spine or imaged skull base. The osseous structures appear diffusely demineralized which may limit detection of small or nondisplaced fractures. No worrisome lytic or blastic lesions. Multilevel cervical spondylitic changes including subchondral sclerosis and cystic change at multiple levels, as detailed below. Additional arthrosis about the atlantodental interval. Soft tissues and spinal canal: No pre or paravertebral fluid or swelling. No visible canal hematoma. Airways patent. Cervical carotid atherosclerosis bilaterally. No worrisome  adenopathy. Disc levels: Multilevel cervical spondylitic changes are noted. There are multilevel disc osteophyte complexes. In combination with some ligamentum flavum infolding, there is moderate canal stenosis C3-4. More mild canal narrowing is seen C4-5, C5-6 and C6-7. Partial effacement of the ventral thecal sac C7-T1 as well. Multilevel facet degenerative changes with uncinate spurring and hypertrophic features are most pronounced on the left C1-2, bilaterally C3-4 and on the right C5-6. Findings result in mild-to-moderate multilevel neural foraminal narrowing with more moderate to severe narrowing C3-4. Upper chest: No acute abnormality in the upper chest or imaged lung apices. Other: Normal thyroid. Bilateral shoulder arthroplasties noted on scout view. IMPRESSION: 1. No acute intracranial abnormality. No calvarial fracture or scalp swelling. 2. No cervical spine fracture or traumatic listhesis. 3. Background of microvascular angiopathy, parenchymal volume loss, and both intracranial and cervical carotid atherosclerosis. 4. Opacification the right maxillary sinus with some hyperostotic features suggesting chronicity. 5. Multilevel cervical spondylitic change in facet degenerative features, as detailed above. Findings result in moderate canal stenosis C3-4 with more mild canal narrowing C4-C7 as well as moderate to severe foraminal narrowing C3-4 and mild-to-moderate foraminal narrowing elsewhere throughout the cervical spine. Electronically Signed   By: Lovena Le M.D.   On: 03/04/2021 23:52   CT Angio Chest PE W and/or Wo Contrast  Result Date: 03/04/2021 CLINICAL DATA:  75 year old female with concern for pulmonary embolism. Abdominal pain. Concern for kidney stone. EXAM: CT ANGIOGRAPHY CHEST CT ABDOMEN AND PELVIS WITH CONTRAST TECHNIQUE: Multidetector CT imaging of the chest was performed using the standard protocol during bolus administration of intravenous contrast. Multiplanar CT image  reconstructions and MIPs were obtained to evaluate the vascular anatomy. Multidetector CT imaging of the abdomen and pelvis was performed using the standard protocol during bolus administration of intravenous contrast. CONTRAST:  41m OMNIPAQUE IOHEXOL 350 MG/ML SOLN COMPARISON:  Chest CT dated 10/23/2003 and radiograph dated 03/04/2021. FINDINGS: CTA CHEST FINDINGS Cardiovascular: Mild cardiomegaly. There is coronary vascular calcification. No pericardial effusion. There is mild atherosclerotic calcification of the thoracic aorta. No aneurysmal dilatation or dissection. The origins of the great vessels of the aortic arch appear patent as visualized. Evaluation of the pulmonary arteries is very limited due to severe respiratory motion artifact and suboptimal visualization and evaluation of the peripheral branches. No large central pulmonary artery embolus identified. Mediastinum/Nodes: No hilar or mediastinal adenopathy. The esophagus and the thyroid gland are grossly unremarkable. No mediastinal fluid collection. Lungs/Pleura: Bilateral lower lobe predominant, left greater right streaky and hazy densities may represent edema. Pneumonia is  not excluded clinical correlation is recommended. No focal consolidation, pleural effusion, or pneumothorax. The central airways are patent. Musculoskeletal: Bilateral shoulder arthroplasties. No acute osseous pathology. Review of the MIP images confirms the above findings. CT ABDOMEN and PELVIS FINDINGS No intra-abdominal free air or free fluid. Hepatobiliary: The liver is unremarkable. There is intrahepatic biliary ductal dilatation. Cholecystectomy. No retained calcified stone noted in the central CBD. The common bile duct measures approximately 14 mm in diameter. Pancreas: Unremarkable. No pancreatic ductal dilatation or surrounding inflammatory changes. Spleen: Normal in size without focal abnormality. Adrenals/Urinary Tract: There is a 15 mm right adrenal nodule,  indeterminate. The left adrenal glands unremarkable. There is no hydronephrosis on either side. The visualized ureters and the urinary bladder appear unremarkable Stomach/Bowel: There is sigmoid diverticulosis without active inflammatory changes. There is no bowel obstruction or active inflammation. The appendix is not visualized with certainty. No inflammatory changes identified in the right lower quadrant. Vascular/Lymphatic: Advanced aortoiliac atherosclerotic disease. The IVC is unremarkable. No portal venous gas. There is no adenopathy. Reproductive: Hysterectomy. No adnexal masses. Other: None Musculoskeletal: Osteopenia with severe degenerative changes of the spine. L2-L3 disc spacer. No acute osseous pathology. Review of the MIP images confirms the above findings. IMPRESSION: 1. No CT evidence of central pulmonary artery embolus. 2. Bilateral streaky and hazy densities may represent edema. Pneumonia is not excluded clinical correlation is recommended. 3. Sigmoid diverticulosis. No bowel obstruction. 4. Dilatation of the intrahepatic and extrahepatic biliary tree, likely post cholecystectomy. No retained calcified stone noted. MRCP may provide better evaluation, on a nonemergent/outpatient basis, if clinically indicated. 5. Aortic Atherosclerosis (ICD10-I70.0). Electronically Signed   By: Anner Crete M.D.   On: 03/04/2021 23:56   CT Cervical Spine Wo Contrast  Result Date: 03/04/2021 CLINICAL DATA:  Moaning and agitation, back and neck pain EXAM: CT HEAD WITHOUT CONTRAST CT CERVICAL SPINE WITHOUT CONTRAST TECHNIQUE: Multidetector CT imaging of the head and cervical spine was performed following the standard protocol without intravenous contrast. Multiplanar CT image reconstructions of the cervical spine were also generated. COMPARISON:  CT 01/06/2021 FINDINGS: CT HEAD FINDINGS Brain: No evidence of acute infarction, hemorrhage, hydrocephalus, extra-axial collection, visible mass lesion or mass  effect. Symmetric prominence of the ventricles, cisterns and sulci compatible with parenchymal volume loss. Patchy areas of [place] matter hypoattenuation are most compatible with chronic microvascular angiopathy. Vascular: Atherosclerotic calcification of the carotid siphons. No hyperdense vessel. Skull: No calvarial fracture or suspicious osseous lesion. No scalp swelling or hematoma. Sinuses/Orbits: Complete opacification of the right maxillary sinus. Some minimally hyperostotic changes. No worrisome stranding or inflammation along the sinus perimeter. Remaining paranasal sinuses are predominantly clear. Mastoid air cells are well aerated. Middle ear cavities are clear. Orbital structures are unremarkable aside from prior lens extractions. Other: Partly edentulous with mandibular prognathism. CT CERVICAL SPINE FINDINGS Alignment: Straightening of normal cervical lordosis. Degenerative stepwise anterolisthesis C3-C5 and retrolisthesis C5 on C6, unchanged from comparison prior. No evidence of traumatic listhesis. No abnormally widened, perched or jumped facets. Some asymmetric degenerative features along the facets, further detailed below. Normal alignment of the craniocervical and atlantoaxial articulations. Skull base and vertebrae: No acute fracture or traumatic osseous injury of the cervical spine or imaged skull base. The osseous structures appear diffusely demineralized which may limit detection of small or nondisplaced fractures. No worrisome lytic or blastic lesions. Multilevel cervical spondylitic changes including subchondral sclerosis and cystic change at multiple levels, as detailed below. Additional arthrosis about the atlantodental interval. Soft tissues and spinal canal: No pre  or paravertebral fluid or swelling. No visible canal hematoma. Airways patent. Cervical carotid atherosclerosis bilaterally. No worrisome adenopathy. Disc levels: Multilevel cervical spondylitic changes are noted. There are  multilevel disc osteophyte complexes. In combination with some ligamentum flavum infolding, there is moderate canal stenosis C3-4. More mild canal narrowing is seen C4-5, C5-6 and C6-7. Partial effacement of the ventral thecal sac C7-T1 as well. Multilevel facet degenerative changes with uncinate spurring and hypertrophic features are most pronounced on the left C1-2, bilaterally C3-4 and on the right C5-6. Findings result in mild-to-moderate multilevel neural foraminal narrowing with more moderate to severe narrowing C3-4. Upper chest: No acute abnormality in the upper chest or imaged lung apices. Other: Normal thyroid. Bilateral shoulder arthroplasties noted on scout view. IMPRESSION: 1. No acute intracranial abnormality. No calvarial fracture or scalp swelling. 2. No cervical spine fracture or traumatic listhesis. 3. Background of microvascular angiopathy, parenchymal volume loss, and both intracranial and cervical carotid atherosclerosis. 4. Opacification the right maxillary sinus with some hyperostotic features suggesting chronicity. 5. Multilevel cervical spondylitic change in facet degenerative features, as detailed above. Findings result in moderate canal stenosis C3-4 with more mild canal narrowing C4-C7 as well as moderate to severe foraminal narrowing C3-4 and mild-to-moderate foraminal narrowing elsewhere throughout the cervical spine. Electronically Signed   By: Lovena Le M.D.   On: 03/04/2021 23:52   CT ABDOMEN PELVIS W CONTRAST  Result Date: 03/04/2021 CLINICAL DATA:  75 year old female with concern for pulmonary embolism. Abdominal pain. Concern for kidney stone. EXAM: CT ANGIOGRAPHY CHEST CT ABDOMEN AND PELVIS WITH CONTRAST TECHNIQUE: Multidetector CT imaging of the chest was performed using the standard protocol during bolus administration of intravenous contrast. Multiplanar CT image reconstructions and MIPs were obtained to evaluate the vascular anatomy. Multidetector CT imaging of the  abdomen and pelvis was performed using the standard protocol during bolus administration of intravenous contrast. CONTRAST:  70m OMNIPAQUE IOHEXOL 350 MG/ML SOLN COMPARISON:  Chest CT dated 10/23/2003 and radiograph dated 03/04/2021. FINDINGS: CTA CHEST FINDINGS Cardiovascular: Mild cardiomegaly. There is coronary vascular calcification. No pericardial effusion. There is mild atherosclerotic calcification of the thoracic aorta. No aneurysmal dilatation or dissection. The origins of the great vessels of the aortic arch appear patent as visualized. Evaluation of the pulmonary arteries is very limited due to severe respiratory motion artifact and suboptimal visualization and evaluation of the peripheral branches. No large central pulmonary artery embolus identified. Mediastinum/Nodes: No hilar or mediastinal adenopathy. The esophagus and the thyroid gland are grossly unremarkable. No mediastinal fluid collection. Lungs/Pleura: Bilateral lower lobe predominant, left greater right streaky and hazy densities may represent edema. Pneumonia is not excluded clinical correlation is recommended. No focal consolidation, pleural effusion, or pneumothorax. The central airways are patent. Musculoskeletal: Bilateral shoulder arthroplasties. No acute osseous pathology. Review of the MIP images confirms the above findings. CT ABDOMEN and PELVIS FINDINGS No intra-abdominal free air or free fluid. Hepatobiliary: The liver is unremarkable. There is intrahepatic biliary ductal dilatation. Cholecystectomy. No retained calcified stone noted in the central CBD. The common bile duct measures approximately 14 mm in diameter. Pancreas: Unremarkable. No pancreatic ductal dilatation or surrounding inflammatory changes. Spleen: Normal in size without focal abnormality. Adrenals/Urinary Tract: There is a 15 mm right adrenal nodule, indeterminate. The left adrenal glands unremarkable. There is no hydronephrosis on either side. The visualized  ureters and the urinary bladder appear unremarkable Stomach/Bowel: There is sigmoid diverticulosis without active inflammatory changes. There is no bowel obstruction or active inflammation. The appendix is not visualized with  certainty. No inflammatory changes identified in the right lower quadrant. Vascular/Lymphatic: Advanced aortoiliac atherosclerotic disease. The IVC is unremarkable. No portal venous gas. There is no adenopathy. Reproductive: Hysterectomy. No adnexal masses. Other: None Musculoskeletal: Osteopenia with severe degenerative changes of the spine. L2-L3 disc spacer. No acute osseous pathology. Review of the MIP images confirms the above findings. IMPRESSION: 1. No CT evidence of central pulmonary artery embolus. 2. Bilateral streaky and hazy densities may represent edema. Pneumonia is not excluded clinical correlation is recommended. 3. Sigmoid diverticulosis. No bowel obstruction. 4. Dilatation of the intrahepatic and extrahepatic biliary tree, likely post cholecystectomy. No retained calcified stone noted. MRCP may provide better evaluation, on a nonemergent/outpatient basis, if clinically indicated. 5. Aortic Atherosclerosis (ICD10-I70.0). Electronically Signed   By: Anner Crete M.D.   On: 03/04/2021 23:56   DG Chest Portable 1 View  Result Date: 03/04/2021 CLINICAL DATA:  Altered mental status EXAM: PORTABLE CHEST 1 VIEW COMPARISON:  01/06/2021 FINDINGS: Cardiac shadow is at the upper limits of normal in size. CardioMEMS device is again seen and stable. The lungs are well aerated bilaterally. Mild central vascular congestion is seen without interstitial edema. No focal infiltrate is noted. Bilateral shoulder replacements are seen. IMPRESSION: Mild vascular congestion without interstitial edema. Electronically Signed   By: Inez Catalina M.D.   On: 03/04/2021 21:39   ECHOCARDIOGRAM COMPLETE  Result Date: 03/05/2021    ECHOCARDIOGRAM REPORT   Patient Name:   NARE GASPARI Askin Date of  Exam: 03/05/2021 Medical Rec #:  [id number]        Height:       66.0 in Accession #:    [id number]       Weight:       275.6 lb Date of Birth:  12/18/1945         BSA:          2.292 m Patient Age:    1 years         BP:           144/61 mmHg Patient Gender: F                HR:           63 bpm. Exam Location:  [facility] Procedure: 2D Echo, Color Doppler, Cardiac Doppler and Intracardiac            Opacification Agent Indications:     I50.31 CHF-Acute Diastolic  History:         Patient has prior history of Echocardiogram examinations. CKD                  and TIA, Signs/Symptoms:Dementia; Risk Factors:Hypertension,                  Dyslipidemia, Sleep Apnea and Diabetes.  Sonographer:     Charmayne Sheer RDCS (AE) Referring Phys:  [id number] Athena Masse Diagnosing Phys: Kate Sable MD  Sonographer Comments: Suboptimal apical window and no subcostal window. Global longitudinal strain was attempted. IMPRESSIONS  1. Left ventricular ejection fraction, by estimation, is 60 to 65%. The left ventricle has normal function. The left ventricle has no regional wall motion abnormalities. There is mild left ventricular hypertrophy. Left ventricular diastolic parameters are consistent with Grade I diastolic dysfunction (impaired relaxation).  2. Right ventricular systolic function is normal. The right ventricular size is normal.  3. The mitral valve is normal in structure. No evidence of mitral valve regurgitation.  4. The aortic valve is tricuspid. Aortic valve  regurgitation is not visualized. Mild aortic valve sclerosis is present, with no evidence of aortic valve stenosis.  5. The inferior vena cava is normal in size with greater than 50% respiratory variability, suggesting right atrial pressure of 3 mmHg. FINDINGS  Left Ventricle: Left ventricular ejection fraction, by estimation, is 60 to 65%. The left ventricle has normal function. The left ventricle has no regional wall motion abnormalities. The left ventricular internal  cavity size was normal in size. There is  mild left ventricular hypertrophy. Left ventricular diastolic parameters are consistent with Grade I diastolic dysfunction (impaired relaxation). Right Ventricle: The right ventricular size is normal. No increase in right ventricular wall thickness. Right ventricular systolic function is normal. Left Atrium: Left atrial size was normal in size. Right Atrium: Right atrial size was normal in size. Pericardium: There is no evidence of pericardial effusion. Mitral Valve: The mitral valve is normal in structure. No evidence of mitral valve regurgitation. MV peak gradient, 3.3 mmHg. The mean mitral valve gradient is 1.0 mmHg. Tricuspid Valve: The tricuspid valve is normal in structure. Tricuspid valve regurgitation is not demonstrated. Aortic Valve: The aortic valve is tricuspid. Aortic valve regurgitation is not visualized. Mild aortic valve sclerosis is present, with no evidence of aortic valve stenosis. Aortic valve mean gradient measures 4.0 mmHg. Aortic valve peak gradient measures 8.8 mmHg. Aortic valve area, by VTI measures 2.71 cm. Pulmonic Valve: The pulmonic valve was normal in structure. Pulmonic valve regurgitation is not visualized. Aorta: The aortic root is normal in size and structure. Venous: The inferior vena cava is normal in size with greater than 50% respiratory variability, suggesting right atrial pressure of 3 mmHg. IAS/Shunts: No atrial level shunt detected by color flow Doppler.  LEFT VENTRICLE PLAX 2D LVIDd:         4.70 cm  Diastology LVIDs:         2.70 cm  LV e' medial:    7.72 cm/s LV PW:         1.00 cm  LV E/e' medial:  7.0 LV IVS:        1.20 cm  LV e' lateral:   8.49 cm/s LVOT diam:     2.10 cm  LV E/e' lateral: 6.4 LV SV:         73 LV SV Index:   32 LVOT Area:     3.46 cm  RIGHT VENTRICLE RV Basal diam:  3.90 cm LEFT ATRIUM           Index       RIGHT ATRIUM           Index LA diam:      3.50 cm 1.53 cm/m  RA Area:     10.70 cm LA Vol (A4C):  42.9 ml 18.72 ml/m RA Volume:   21.60 ml  9.43 ml/m  AORTIC VALVE                   PULMONIC VALVE AV Area (Vmax):    2.39 cm    PV Vmax:       1.31 m/s AV Area (Vmean):   2.79 cm    PV Vmean:      95.200 cm/s AV Area (VTI):     2.71 cm    PV VTI:        0.276 m AV Vmax:           148.00 cm/s PV Peak grad:  6.9 mmHg AV Vmean:  95.100 cm/s PV Mean grad:  4.0 mmHg AV VTI:            0.271 m AV Peak Grad:      8.8 mmHg AV Mean Grad:      4.0 mmHg LVOT Vmax:         102.00 cm/s LVOT Vmean:        76.600 cm/s LVOT VTI:          0.212 m LVOT/AV VTI ratio: 0.78  AORTA Ao Root diam: 3.20 cm MITRAL VALVE MV Area (PHT): 2.58 cm    SHUNTS MV Area VTI:   3.30 cm    Systemic VTI:  0.21 m MV Peak grad:  3.3 mmHg    Systemic Diam: 2.10 cm MV Mean grad:  1.0 mmHg MV Vmax:       0.91 m/s MV Vmean:      43.5 cm/s MV Decel Time: 294 msec MV E velocity: 54.40 cm/s MV A velocity: 92.10 cm/s MV E/A ratio:  0.59 Kate Sable MD Electronically signed by Kate Sable MD Signature Date/Time: 03/05/2021/1:19:45 PM    Final      Assessment and Plan:   HFpEF -Echo today shows preserved ejection fraction, impaired relaxation, EF 60 to 65% -Patient not in acute heart failure. -Continue IV Lasix pending swallow eval -Restart PTA torsemide 80 mg daily when patient able to take p.o.  2.  Hypertension -PTA Norvasc, torsemide as above when able to take p.o.  3.  Altered mental status, UTI, pneumonia -Mentation improved on antibiotics -Antibiotics, management as per primary team  Overall patient has stable chronic cardiac disease.  Does not appear to be in acute heart failure.  Lasix IV okay for now.  Restart p.o. torsemide when able, after swallow eval.  Echocardiogram shows preserved ejection fraction.  No additional cardiac testing or medication adjustments required at this time.  Keep appointment with heart failure team as outpatient which is scheduled in 5 days.  Cardiology will sign off, let us know if  additional input is needed.  Total encounter time more than 110 minutes  Greater than 50% was spent in counseling and coordination of care with the patient    Signed, Kate Sable, MD  03/05/2021 1:21 PM

## 2021-03-05 NOTE — Progress Notes (Signed)
PHARMACIST - PHYSICIAN COMMUNICATION  CONCERNING:  Enoxaparin (Lovenox) for DVT Prophylaxis    RECOMMENDATION: Patient was prescribed enoxaprin 40mg  q24 hours for VTE prophylaxis.   Filed Weights   03/04/21 2112  Weight: 125 kg (275 lb 9.2 oz)    Body mass index is 44.48 kg/m.  Estimated Creatinine Clearance: 42.3 mL/min (A) (by C-G formula based on SCr of 1.53 mg/dL (H)).   Based on [place] patient is candidate for enoxaparin 0.5mg /kg TBW SQ every 24 hours based on BMI being >30.  DESCRIPTION: Pharmacy has adjusted enoxaparin dose per [facility] policy.  Patient is now receiving enoxaparin 0.5 mg/kg every 24 hours   Renda Rolls, PharmD, [facility] 03/05/2021 2:35 AM

## 2021-03-05 NOTE — Progress Notes (Signed)
Patient made NPO this morning and speech evaluation ordered d/t dysphagia.  Daughter and patient requesting to speak with TOC regarding changing SNFs at d/c. [facility] consult order is in and comment added about requesting a change of SNF.

## 2021-03-05 NOTE — Progress Notes (Signed)
PROGRESS NOTE    [name]  HLK:[id number] DOB: 16-Sep-1945 DOA: 03/04/2021 PCP: System, Provider Not In   Assessment & Plan:   Principal Problem:   Acute metabolic encephalopathy Active Problems:   Acute on chronic diastolic CHF (congestive heart failure) (HCC)   Acute respiratory failure with hypoxia (HCC)   UTI (urinary tract infection)   Stage 3a chronic kidney disease (HCC)   Obesity, Class III, BMI 40-49.9 (morbid obesity) (HCC)   Elevated lipase   COPD (chronic obstructive pulmonary disease) (HCC)   Acute metabolic encephalopathy: likely secondary to UTI. Urine cx is pending. Continue on IV rocephin. CT head with no acute findings  UTI: UA is positive. Urine cx is pending. Continue on IV rocephin   Chronic diastolic CHF: as per cardio. Continue on coreg, IV lasix. Monitor I/Os & daily weights. Cardio consulted   Elevated lipase: at 135. CT abdomen and pelvis shows post cholecystectomy changes   CKDIIIa: Cr is trending up slightly from day prior. Will continue to monitor   Morbid obesity: BMI 44.4. Complicates overall care and prognosis    COPD: w/o exacerbation. Continue on bronchodilators  DVT prophylaxis: lovenox  Code Status:  full Family Communication: discussed pt's care w/ pt's family at bedside and answered their questions  Disposition Plan: depends on PT/OT recs (not consulted yet)  Level of care: Progressive Cardiac  Status is: Inpatient  Remains inpatient appropriate because:IV treatments appropriate due to intensity of illness or inability to take PO and Inpatient level of care appropriate due to severity of illness  Dispo: The patient is from: Home              Anticipated d/c is to: Home              Patient currently is not medically stable to d/c.   Difficult to place patient : unclear       Consultants:  Cardio   Procedures:  Antimicrobials: rocephin    Subjective: Pt c/o shortness of breath   Objective: Vitals:    03/05/21 0359 03/05/21 0400 03/05/21 0551 03/05/21 0725  BP:  (!) 100/48  (!) 144/61  Pulse:  64 65 66  Resp:  11 15 17   Temp:    98.1 F (36.7 C)  TempSrc:    Axillary  SpO2: 97% 100% 95% 98%  Weight:      Height:        Intake/Output Summary (Last 24 hours) at 03/05/2021 0902 Last data filed at 03/05/2021 0148 Gross per 24 hour  Intake 106.67 ml  Output --  Net 106.67 ml   Filed Weights   03/04/21 2112  Weight: 125 kg    Examination:  General exam: Appears calm and comfortable  Respiratory system: diminished breath sounds b/l Cardiovascular system: S1 & S2 +. No  rubs, gallops or clicks.  Gastrointestinal system: Abdomen is obese, soft and nontender.  Normal bowel sounds heard. Central nervous system: Alert and awake. Moves all extremities Psychiatry: Judgement and insight appear normal. Flat mood and affect     Data Reviewed: I have personally reviewed following labs and imaging studies  CBC: Recent Labs  Lab 03/04/21 2130 03/05/21 0058  WBC 8.7 7.8  HGB 12.9 11.8*  HCT 41.1 37.6  MCV 88.4 90.2  PLT 221 734   Basic Metabolic Panel: Recent Labs  Lab 03/04/21 2130 03/05/21 0058  NA 136  --   K 4.6  --   CL 94*  --   CO2 29  --  GLUCOSE 98  --   BUN 34*  --   CREATININE 1.53* 1.60*  CALCIUM 10.0  --    GFR: Estimated Creatinine Clearance: 40.4 mL/min (A) (by C-G formula based on SCr of 1.6 mg/dL (H)). Liver Function Tests: Recent Labs  Lab 03/04/21 2130  AST 25  ALT 16  ALKPHOS 116  BILITOT 1.2  PROT 7.6  ALBUMIN 3.9   Recent Labs  Lab 03/04/21 2130  LIPASE 135*   No results for input(s): AMMONIA in the last 168 hours. Coagulation Profile: No results for input(s): INR, PROTIME in the last 168 hours. Cardiac Enzymes: No results for input(s): CKTOTAL, CKMB, CKMBINDEX, TROPONINI in the last 168 hours. BNP (last 3 results) No results for input(s): PROBNP in the last 8760 hours. HbA1C: No results for input(s): HGBA1C in the last 72  hours. CBG: No results for input(s): GLUCAP in the last 168 hours. Lipid Profile: No results for input(s): CHOL, HDL, LDLCALC, TRIG, CHOLHDL, LDLDIRECT in the last 72 hours. Thyroid Function Tests: No results for input(s): TSH, T4TOTAL, FREET4, T3FREE, THYROIDAB in the last 72 hours. Anemia Panel: No results for input(s): VITAMINB12, FOLATE, FERRITIN, TIBC, IRON, RETICCTPCT in the last 72 hours. Sepsis Labs: Recent Labs  Lab 03/04/21 2130  LATICACIDVEN 1.1    Recent Results (from the past 240 hour(s))  Resp Panel by RT-PCR (Flu A&B, Covid) Nasopharyngeal Swab     Status: None   Collection Time: 03/04/21  9:33 PM   Specimen: Nasopharyngeal Swab; Nasopharyngeal(NP) swabs in vial transport medium  Result Value Ref Range Status   SARS Coronavirus 2 by RT PCR NEGATIVE NEGATIVE Final    Comment: (NOTE) SARS-CoV-2 target nucleic acids are NOT DETECTED.  The SARS-CoV-2 RNA is generally detectable in upper respiratory specimens during the acute phase of infection. The lowest concentration of SARS-CoV-2 viral copies this assay can detect is 138 copies/mL. A negative result does not preclude SARS-Cov-2 infection and should not be used as the sole basis for treatment or other patient management decisions. A negative result may occur with  improper specimen collection/handling, submission of specimen other than nasopharyngeal swab, presence of viral mutation(s) within the areas targeted by this assay, and inadequate number of viral copies(<138 copies/mL). A negative result must be combined with clinical observations, patient history, and epidemiological information. The expected result is Negative.  Fact Sheet for Patients:  EntrepreneurPulse.com.au  Fact Sheet for Healthcare Providers:  IncredibleEmployment.be  This test is no t yet approved or cleared by the [place] FDA and  has been authorized for detection and/or diagnosis of SARS-CoV-2  by FDA under an Emergency Use Authorization (EUA). This EUA will remain  in effect (meaning this test can be used) for the duration of the COVID-19 declaration under Section 564(b)(1) of the Act, 21 U.S.C.section 360bbb-3(b)(1), unless the authorization is terminated  or revoked sooner.       Influenza A by PCR NEGATIVE NEGATIVE Final   Influenza B by PCR NEGATIVE NEGATIVE Final    Comment: (NOTE) The Xpert Xpress SARS-CoV-2/FLU/RSV plus assay is intended as an aid in the diagnosis of influenza from Nasopharyngeal swab specimens and should not be used as a sole basis for treatment. Nasal washings and aspirates are unacceptable for Xpert Xpress SARS-CoV-2/FLU/RSV testing.  Fact Sheet for Patients: EntrepreneurPulse.com.au  Fact Sheet for Healthcare Providers: IncredibleEmployment.be  This test is not yet approved or cleared by the [place] FDA and has been authorized for detection and/or diagnosis of SARS-CoV-2 by FDA under an Emergency Use  Authorization (EUA). This EUA will remain in effect (meaning this test can be used) for the duration of the COVID-19 declaration under Section 564(b)(1) of the Act, 21 U.S.C. section 360bbb-3(b)(1), unless the authorization is terminated or revoked.  Performed at [facility], [address], [place], [place] [address]          Radiology Studies: CT Head Wo Contrast  Result Date: 03/04/2021 CLINICAL DATA:  Moaning and agitation, back and neck pain EXAM: CT HEAD WITHOUT CONTRAST CT CERVICAL SPINE WITHOUT CONTRAST TECHNIQUE: Multidetector CT imaging of the head and cervical spine was performed following the standard protocol without intravenous contrast. Multiplanar CT image reconstructions of the cervical spine were also generated. COMPARISON:  CT 01/06/2021 FINDINGS: CT HEAD FINDINGS Brain: No evidence of acute infarction, hemorrhage, hydrocephalus, extra-axial collection, visible mass  lesion or mass effect. Symmetric prominence of the ventricles, cisterns and sulci compatible with parenchymal volume loss. Patchy areas of [place] matter hypoattenuation are most compatible with chronic microvascular angiopathy. Vascular: Atherosclerotic calcification of the carotid siphons. No hyperdense vessel. Skull: No calvarial fracture or suspicious osseous lesion. No scalp swelling or hematoma. Sinuses/Orbits: Complete opacification of the right maxillary sinus. Some minimally hyperostotic changes. No worrisome stranding or inflammation along the sinus perimeter. Remaining paranasal sinuses are predominantly clear. Mastoid air cells are well aerated. Middle ear cavities are clear. Orbital structures are unremarkable aside from prior lens extractions. Other: Partly edentulous with mandibular prognathism. CT CERVICAL SPINE FINDINGS Alignment: Straightening of normal cervical lordosis. Degenerative stepwise anterolisthesis C3-C5 and retrolisthesis C5 on C6, unchanged from comparison prior. No evidence of traumatic listhesis. No abnormally widened, perched or jumped facets. Some asymmetric degenerative features along the facets, further detailed below. Normal alignment of the craniocervical and atlantoaxial articulations. Skull base and vertebrae: No acute fracture or traumatic osseous injury of the cervical spine or imaged skull base. The osseous structures appear diffusely demineralized which may limit detection of small or nondisplaced fractures. No worrisome lytic or blastic lesions. Multilevel cervical spondylitic changes including subchondral sclerosis and cystic change at multiple levels, as detailed below. Additional arthrosis about the atlantodental interval. Soft tissues and spinal canal: No pre or paravertebral fluid or swelling. No visible canal hematoma. Airways patent. Cervical carotid atherosclerosis bilaterally. No worrisome adenopathy. Disc levels: Multilevel cervical spondylitic changes are noted.  There are multilevel disc osteophyte complexes. In combination with some ligamentum flavum infolding, there is moderate canal stenosis C3-4. More mild canal narrowing is seen C4-5, C5-6 and C6-7. Partial effacement of the ventral thecal sac C7-T1 as well. Multilevel facet degenerative changes with uncinate spurring and hypertrophic features are most pronounced on the left C1-2, bilaterally C3-4 and on the right C5-6. Findings result in mild-to-moderate multilevel neural foraminal narrowing with more moderate to severe narrowing C3-4. Upper chest: No acute abnormality in the upper chest or imaged lung apices. Other: Normal thyroid. Bilateral shoulder arthroplasties noted on scout view. IMPRESSION: 1. No acute intracranial abnormality. No calvarial fracture or scalp swelling. 2. No cervical spine fracture or traumatic listhesis. 3. Background of microvascular angiopathy, parenchymal volume loss, and both intracranial and cervical carotid atherosclerosis. 4. Opacification the right maxillary sinus with some hyperostotic features suggesting chronicity. 5. Multilevel cervical spondylitic change in facet degenerative features, as detailed above. Findings result in moderate canal stenosis C3-4 with more mild canal narrowing C4-C7 as well as moderate to severe foraminal narrowing C3-4 and mild-to-moderate foraminal narrowing elsewhere throughout the cervical spine. Electronically Signed   By: Lovena Le M.D.   On: 03/04/2021  23:52   CT Angio Chest PE W and/or Wo Contrast  Result Date: 03/04/2021 CLINICAL DATA:  75 year old female with concern for pulmonary embolism. Abdominal pain. Concern for kidney stone. EXAM: CT ANGIOGRAPHY CHEST CT ABDOMEN AND PELVIS WITH CONTRAST TECHNIQUE: Multidetector CT imaging of the chest was performed using the standard protocol during bolus administration of intravenous contrast. Multiplanar CT image reconstructions and MIPs were obtained to evaluate the vascular anatomy. Multidetector  CT imaging of the abdomen and pelvis was performed using the standard protocol during bolus administration of intravenous contrast. CONTRAST:  64mL OMNIPAQUE IOHEXOL 350 MG/ML SOLN COMPARISON:  Chest CT dated 10/23/2003 and radiograph dated 03/04/2021. FINDINGS: CTA CHEST FINDINGS Cardiovascular: Mild cardiomegaly. There is coronary vascular calcification. No pericardial effusion. There is mild atherosclerotic calcification of the thoracic aorta. No aneurysmal dilatation or dissection. The origins of the great vessels of the aortic arch appear patent as visualized. Evaluation of the pulmonary arteries is very limited due to severe respiratory motion artifact and suboptimal visualization and evaluation of the peripheral branches. No large central pulmonary artery embolus identified. Mediastinum/Nodes: No hilar or mediastinal adenopathy. The esophagus and the thyroid gland are grossly unremarkable. No mediastinal fluid collection. Lungs/Pleura: Bilateral lower lobe predominant, left greater right streaky and hazy densities may represent edema. Pneumonia is not excluded clinical correlation is recommended. No focal consolidation, pleural effusion, or pneumothorax. The central airways are patent. Musculoskeletal: Bilateral shoulder arthroplasties. No acute osseous pathology. Review of the MIP images confirms the above findings. CT ABDOMEN and PELVIS FINDINGS No intra-abdominal free air or free fluid. Hepatobiliary: The liver is unremarkable. There is intrahepatic biliary ductal dilatation. Cholecystectomy. No retained calcified stone noted in the central CBD. The common bile duct measures approximately 14 mm in diameter. Pancreas: Unremarkable. No pancreatic ductal dilatation or surrounding inflammatory changes. Spleen: Normal in size without focal abnormality. Adrenals/Urinary Tract: There is a 15 mm right adrenal nodule, indeterminate. The left adrenal glands unremarkable. There is no hydronephrosis on either side.  The visualized ureters and the urinary bladder appear unremarkable Stomach/Bowel: There is sigmoid diverticulosis without active inflammatory changes. There is no bowel obstruction or active inflammation. The appendix is not visualized with certainty. No inflammatory changes identified in the right lower quadrant. Vascular/Lymphatic: Advanced aortoiliac atherosclerotic disease. The IVC is unremarkable. No portal venous gas. There is no adenopathy. Reproductive: Hysterectomy. No adnexal masses. Other: None Musculoskeletal: Osteopenia with severe degenerative changes of the spine. L2-L3 disc spacer. No acute osseous pathology. Review of the MIP images confirms the above findings. IMPRESSION: 1. No CT evidence of central pulmonary artery embolus. 2. Bilateral streaky and hazy densities may represent edema. Pneumonia is not excluded clinical correlation is recommended. 3. Sigmoid diverticulosis. No bowel obstruction. 4. Dilatation of the intrahepatic and extrahepatic biliary tree, likely post cholecystectomy. No retained calcified stone noted. MRCP may provide better evaluation, on a nonemergent/outpatient basis, if clinically indicated. 5. Aortic Atherosclerosis (ICD10-I70.0). Electronically Signed   By: Anner Crete M.D.   On: 03/04/2021 23:56   CT Cervical Spine Wo Contrast  Result Date: 03/04/2021 CLINICAL DATA:  Moaning and agitation, back and neck pain EXAM: CT HEAD WITHOUT CONTRAST CT CERVICAL SPINE WITHOUT CONTRAST TECHNIQUE: Multidetector CT imaging of the head and cervical spine was performed following the standard protocol without intravenous contrast. Multiplanar CT image reconstructions of the cervical spine were also generated. COMPARISON:  CT 01/06/2021 FINDINGS: CT HEAD FINDINGS Brain: No evidence of acute infarction, hemorrhage, hydrocephalus, extra-axial collection, visible mass lesion or mass effect. Symmetric prominence of the  ventricles, cisterns and sulci compatible with parenchymal volume  loss. Patchy areas of [place] matter hypoattenuation are most compatible with chronic microvascular angiopathy. Vascular: Atherosclerotic calcification of the carotid siphons. No hyperdense vessel. Skull: No calvarial fracture or suspicious osseous lesion. No scalp swelling or hematoma. Sinuses/Orbits: Complete opacification of the right maxillary sinus. Some minimally hyperostotic changes. No worrisome stranding or inflammation along the sinus perimeter. Remaining paranasal sinuses are predominantly clear. Mastoid air cells are well aerated. Middle ear cavities are clear. Orbital structures are unremarkable aside from prior lens extractions. Other: Partly edentulous with mandibular prognathism. CT CERVICAL SPINE FINDINGS Alignment: Straightening of normal cervical lordosis. Degenerative stepwise anterolisthesis C3-C5 and retrolisthesis C5 on C6, unchanged from comparison prior. No evidence of traumatic listhesis. No abnormally widened, perched or jumped facets. Some asymmetric degenerative features along the facets, further detailed below. Normal alignment of the craniocervical and atlantoaxial articulations. Skull base and vertebrae: No acute fracture or traumatic osseous injury of the cervical spine or imaged skull base. The osseous structures appear diffusely demineralized which may limit detection of small or nondisplaced fractures. No worrisome lytic or blastic lesions. Multilevel cervical spondylitic changes including subchondral sclerosis and cystic change at multiple levels, as detailed below. Additional arthrosis about the atlantodental interval. Soft tissues and spinal canal: No pre or paravertebral fluid or swelling. No visible canal hematoma. Airways patent. Cervical carotid atherosclerosis bilaterally. No worrisome adenopathy. Disc levels: Multilevel cervical spondylitic changes are noted. There are multilevel disc osteophyte complexes. In combination with some ligamentum flavum infolding, there is  moderate canal stenosis C3-4. More mild canal narrowing is seen C4-5, C5-6 and C6-7. Partial effacement of the ventral thecal sac C7-T1 as well. Multilevel facet degenerative changes with uncinate spurring and hypertrophic features are most pronounced on the left C1-2, bilaterally C3-4 and on the right C5-6. Findings result in mild-to-moderate multilevel neural foraminal narrowing with more moderate to severe narrowing C3-4. Upper chest: No acute abnormality in the upper chest or imaged lung apices. Other: Normal thyroid. Bilateral shoulder arthroplasties noted on scout view. IMPRESSION: 1. No acute intracranial abnormality. No calvarial fracture or scalp swelling. 2. No cervical spine fracture or traumatic listhesis. 3. Background of microvascular angiopathy, parenchymal volume loss, and both intracranial and cervical carotid atherosclerosis. 4. Opacification the right maxillary sinus with some hyperostotic features suggesting chronicity. 5. Multilevel cervical spondylitic change in facet degenerative features, as detailed above. Findings result in moderate canal stenosis C3-4 with more mild canal narrowing C4-C7 as well as moderate to severe foraminal narrowing C3-4 and mild-to-moderate foraminal narrowing elsewhere throughout the cervical spine. Electronically Signed   By: Lovena Le M.D.   On: 03/04/2021 23:52   CT ABDOMEN PELVIS W CONTRAST  Result Date: 03/04/2021 CLINICAL DATA:  75 year old female with concern for pulmonary embolism. Abdominal pain. Concern for kidney stone. EXAM: CT ANGIOGRAPHY CHEST CT ABDOMEN AND PELVIS WITH CONTRAST TECHNIQUE: Multidetector CT imaging of the chest was performed using the standard protocol during bolus administration of intravenous contrast. Multiplanar CT image reconstructions and MIPs were obtained to evaluate the vascular anatomy. Multidetector CT imaging of the abdomen and pelvis was performed using the standard protocol during bolus administration of intravenous  contrast. CONTRAST:  90mL OMNIPAQUE IOHEXOL 350 MG/ML SOLN COMPARISON:  Chest CT dated 10/23/2003 and radiograph dated 03/04/2021. FINDINGS: CTA CHEST FINDINGS Cardiovascular: Mild cardiomegaly. There is coronary vascular calcification. No pericardial effusion. There is mild atherosclerotic calcification of the thoracic aorta. No aneurysmal dilatation or dissection. The origins of the great vessels of the aortic arch  appear patent as visualized. Evaluation of the pulmonary arteries is very limited due to severe respiratory motion artifact and suboptimal visualization and evaluation of the peripheral branches. No large central pulmonary artery embolus identified. Mediastinum/Nodes: No hilar or mediastinal adenopathy. The esophagus and the thyroid gland are grossly unremarkable. No mediastinal fluid collection. Lungs/Pleura: Bilateral lower lobe predominant, left greater right streaky and hazy densities may represent edema. Pneumonia is not excluded clinical correlation is recommended. No focal consolidation, pleural effusion, or pneumothorax. The central airways are patent. Musculoskeletal: Bilateral shoulder arthroplasties. No acute osseous pathology. Review of the MIP images confirms the above findings. CT ABDOMEN and PELVIS FINDINGS No intra-abdominal free air or free fluid. Hepatobiliary: The liver is unremarkable. There is intrahepatic biliary ductal dilatation. Cholecystectomy. No retained calcified stone noted in the central CBD. The common bile duct measures approximately 14 mm in diameter. Pancreas: Unremarkable. No pancreatic ductal dilatation or surrounding inflammatory changes. Spleen: Normal in size without focal abnormality. Adrenals/Urinary Tract: There is a 15 mm right adrenal nodule, indeterminate. The left adrenal glands unremarkable. There is no hydronephrosis on either side. The visualized ureters and the urinary bladder appear unremarkable Stomach/Bowel: There is sigmoid diverticulosis without  active inflammatory changes. There is no bowel obstruction or active inflammation. The appendix is not visualized with certainty. No inflammatory changes identified in the right lower quadrant. Vascular/Lymphatic: Advanced aortoiliac atherosclerotic disease. The IVC is unremarkable. No portal venous gas. There is no adenopathy. Reproductive: Hysterectomy. No adnexal masses. Other: None Musculoskeletal: Osteopenia with severe degenerative changes of the spine. L2-L3 disc spacer. No acute osseous pathology. Review of the MIP images confirms the above findings. IMPRESSION: 1. No CT evidence of central pulmonary artery embolus. 2. Bilateral streaky and hazy densities may represent edema. Pneumonia is not excluded clinical correlation is recommended. 3. Sigmoid diverticulosis. No bowel obstruction. 4. Dilatation of the intrahepatic and extrahepatic biliary tree, likely post cholecystectomy. No retained calcified stone noted. MRCP may provide better evaluation, on a nonemergent/outpatient basis, if clinically indicated. 5. Aortic Atherosclerosis (ICD10-I70.0). Electronically Signed   By: Anner Crete M.D.   On: 03/04/2021 23:56   DG Chest Portable 1 View  Result Date: 03/04/2021 CLINICAL DATA:  Altered mental status EXAM: PORTABLE CHEST 1 VIEW COMPARISON:  01/06/2021 FINDINGS: Cardiac shadow is at the upper limits of normal in size. CardioMEMS device is again seen and stable. The lungs are well aerated bilaterally. Mild central vascular congestion is seen without interstitial edema. No focal infiltrate is noted. Bilateral shoulder replacements are seen. IMPRESSION: Mild vascular congestion without interstitial edema. Electronically Signed   By: Inez Catalina M.D.   On: 03/04/2021 21:39        Scheduled Meds:  carvedilol  6.25 mg Oral BID WC   enoxaparin (LOVENOX) injection  0.5 mg/kg Subcutaneous Q24H   furosemide  40 mg Intravenous Q12H   Continuous Infusions:  [START ON 03/06/2021] cefTRIAXone  (ROCEPHIN)  IV       LOS: 0 days    Time spent: 33 mins     Wyvonnia Dusky, MD [facility] Pager [phone]  If 7PM-7AM, please contact night-coverage 03/05/2021, 9:02 AM

## 2021-03-05 NOTE — Progress Notes (Signed)
*  PRELIMINARY RESULTS* Echocardiogram 2D Echocardiogram has been performed.  [name] 03/05/2021, 10:07 AM

## 2021-03-05 NOTE — ED Provider Notes (Signed)
CTs showing edema. Patient on 5L sating in the low to mid 90s. Per daughter patient only uses O2 at night. Also has a UTI with no signs of sepsis. Will give 20mg  of IV lasix and rocephin. Daughter requesting patient be transferred to [facility] due to proximity to her home and the fact that patient's cardiologist is at [facility] as well. Will call the hospitalist team.     _________________________ 1:05 AM on 03/05/2021 ----------------------------------------- No beds available at [facility]. Will admit here.        I have personally reviewed the images performed during this visit and I agree with the Radiologist's read.   Interpretation by Radiologist:  CT Head Wo Contrast  Result Date: 03/04/2021 CLINICAL DATA:  Moaning and agitation, back and neck pain EXAM: CT HEAD WITHOUT CONTRAST CT CERVICAL SPINE WITHOUT CONTRAST TECHNIQUE: Multidetector CT imaging of the head and cervical spine was performed following the standard protocol without intravenous contrast. Multiplanar CT image reconstructions of the cervical spine were also generated. COMPARISON:  CT 01/06/2021 FINDINGS: CT HEAD FINDINGS Brain: No evidence of acute infarction, hemorrhage, hydrocephalus, extra-axial collection, visible mass lesion or mass effect. Symmetric prominence of the ventricles, cisterns and sulci compatible with parenchymal volume loss. Patchy areas of Vollmer matter hypoattenuation are most compatible with chronic microvascular angiopathy. Vascular: Atherosclerotic calcification of the carotid siphons. No hyperdense vessel. Skull: No calvarial fracture or suspicious osseous lesion. No scalp swelling or hematoma. Sinuses/Orbits: Complete opacification of the right maxillary sinus. Some minimally hyperostotic changes. No worrisome stranding or inflammation along the sinus perimeter. Remaining paranasal sinuses are predominantly clear. Mastoid air cells are well aerated. Middle ear cavities are clear. Orbital structures are  unremarkable aside from prior lens extractions. Other: Partly edentulous with mandibular prognathism. CT CERVICAL SPINE FINDINGS Alignment: Straightening of normal cervical lordosis. Degenerative stepwise anterolisthesis C3-C5 and retrolisthesis C5 on C6, unchanged from comparison prior. No evidence of traumatic listhesis. No abnormally widened, perched or jumped facets. Some asymmetric degenerative features along the facets, further detailed below. Normal alignment of the craniocervical and atlantoaxial articulations. Skull base and vertebrae: No acute fracture or traumatic osseous injury of the cervical spine or imaged skull base. The osseous structures appear diffusely demineralized which may limit detection of small or nondisplaced fractures. No worrisome lytic or blastic lesions. Multilevel cervical spondylitic changes including subchondral sclerosis and cystic change at multiple levels, as detailed below. Additional arthrosis about the atlantodental interval. Soft tissues and spinal canal: No pre or paravertebral fluid or swelling. No visible canal hematoma. Airways patent. Cervical carotid atherosclerosis bilaterally. No worrisome adenopathy. Disc levels: Multilevel cervical spondylitic changes are noted. There are multilevel disc osteophyte complexes. In combination with some ligamentum flavum infolding, there is moderate canal stenosis C3-4. More mild canal narrowing is seen C4-5, C5-6 and C6-7. Partial effacement of the ventral thecal sac C7-T1 as well. Multilevel facet degenerative changes with uncinate spurring and hypertrophic features are most pronounced on the left C1-2, bilaterally C3-4 and on the right C5-6. Findings result in mild-to-moderate multilevel neural foraminal narrowing with more moderate to severe narrowing C3-4. Upper chest: No acute abnormality in the upper chest or imaged lung apices. Other: Normal thyroid. Bilateral shoulder arthroplasties noted on scout view. IMPRESSION: 1. No acute  intracranial abnormality. No calvarial fracture or scalp swelling. 2. No cervical spine fracture or traumatic listhesis. 3. Background of microvascular angiopathy, parenchymal volume loss, and both intracranial and cervical carotid atherosclerosis. 4. Opacification the right maxillary sinus with some hyperostotic features suggesting chronicity. 5. Multilevel  cervical spondylitic change in facet degenerative features, as detailed above. Findings result in moderate canal stenosis C3-4 with more mild canal narrowing C4-C7 as well as moderate to severe foraminal narrowing C3-4 and mild-to-moderate foraminal narrowing elsewhere throughout the cervical spine. Electronically Signed   By: Lovena Le M.D.   On: 03/04/2021 23:52   CT Angio Chest PE W and/or Wo Contrast  Result Date: 03/04/2021 CLINICAL DATA:  75 year old female with concern for pulmonary embolism. Abdominal pain. Concern for kidney stone. EXAM: CT ANGIOGRAPHY CHEST CT ABDOMEN AND PELVIS WITH CONTRAST TECHNIQUE: Multidetector CT imaging of the chest was performed using the standard protocol during bolus administration of intravenous contrast. Multiplanar CT image reconstructions and MIPs were obtained to evaluate the vascular anatomy. Multidetector CT imaging of the abdomen and pelvis was performed using the standard protocol during bolus administration of intravenous contrast. CONTRAST:  22mL OMNIPAQUE IOHEXOL 350 MG/ML SOLN COMPARISON:  Chest CT dated 10/23/2003 and radiograph dated 03/04/2021. FINDINGS: CTA CHEST FINDINGS Cardiovascular: Mild cardiomegaly. There is coronary vascular calcification. No pericardial effusion. There is mild atherosclerotic calcification of the thoracic aorta. No aneurysmal dilatation or dissection. The origins of the great vessels of the aortic arch appear patent as visualized. Evaluation of the pulmonary arteries is very limited due to severe respiratory motion artifact and suboptimal visualization and evaluation of the  peripheral branches. No large central pulmonary artery embolus identified. Mediastinum/Nodes: No hilar or mediastinal adenopathy. The esophagus and the thyroid gland are grossly unremarkable. No mediastinal fluid collection. Lungs/Pleura: Bilateral lower lobe predominant, left greater right streaky and hazy densities may represent edema. Pneumonia is not excluded clinical correlation is recommended. No focal consolidation, pleural effusion, or pneumothorax. The central airways are patent. Musculoskeletal: Bilateral shoulder arthroplasties. No acute osseous pathology. Review of the MIP images confirms the above findings. CT ABDOMEN and PELVIS FINDINGS No intra-abdominal free air or free fluid. Hepatobiliary: The liver is unremarkable. There is intrahepatic biliary ductal dilatation. Cholecystectomy. No retained calcified stone noted in the central CBD. The common bile duct measures approximately 14 mm in diameter. Pancreas: Unremarkable. No pancreatic ductal dilatation or surrounding inflammatory changes. Spleen: Normal in size without focal abnormality. Adrenals/Urinary Tract: There is a 15 mm right adrenal nodule, indeterminate. The left adrenal glands unremarkable. There is no hydronephrosis on either side. The visualized ureters and the urinary bladder appear unremarkable Stomach/Bowel: There is sigmoid diverticulosis without active inflammatory changes. There is no bowel obstruction or active inflammation. The appendix is not visualized with certainty. No inflammatory changes identified in the right lower quadrant. Vascular/Lymphatic: Advanced aortoiliac atherosclerotic disease. The IVC is unremarkable. No portal venous gas. There is no adenopathy. Reproductive: Hysterectomy. No adnexal masses. Other: None Musculoskeletal: Osteopenia with severe degenerative changes of the spine. L2-L3 disc spacer. No acute osseous pathology. Review of the MIP images confirms the above findings. IMPRESSION: 1. No CT evidence of  central pulmonary artery embolus. 2. Bilateral streaky and hazy densities may represent edema. Pneumonia is not excluded clinical correlation is recommended. 3. Sigmoid diverticulosis. No bowel obstruction. 4. Dilatation of the intrahepatic and extrahepatic biliary tree, likely post cholecystectomy. No retained calcified stone noted. MRCP may provide better evaluation, on a nonemergent/outpatient basis, if clinically indicated. 5. Aortic Atherosclerosis (ICD10-I70.0). Electronically Signed   By: Anner Crete M.D.   On: 03/04/2021 23:56   CT Cervical Spine Wo Contrast  Result Date: 03/04/2021 CLINICAL DATA:  Moaning and agitation, back and neck pain EXAM: CT HEAD WITHOUT CONTRAST CT CERVICAL SPINE WITHOUT CONTRAST TECHNIQUE: Multidetector CT imaging  of the head and cervical spine was performed following the standard protocol without intravenous contrast. Multiplanar CT image reconstructions of the cervical spine were also generated. COMPARISON:  CT 01/06/2021 FINDINGS: CT HEAD FINDINGS Brain: No evidence of acute infarction, hemorrhage, hydrocephalus, extra-axial collection, visible mass lesion or mass effect. Symmetric prominence of the ventricles, cisterns and sulci compatible with parenchymal volume loss. Patchy areas of Rondeau matter hypoattenuation are most compatible with chronic microvascular angiopathy. Vascular: Atherosclerotic calcification of the carotid siphons. No hyperdense vessel. Skull: No calvarial fracture or suspicious osseous lesion. No scalp swelling or hematoma. Sinuses/Orbits: Complete opacification of the right maxillary sinus. Some minimally hyperostotic changes. No worrisome stranding or inflammation along the sinus perimeter. Remaining paranasal sinuses are predominantly clear. Mastoid air cells are well aerated. Middle ear cavities are clear. Orbital structures are unremarkable aside from prior lens extractions. Other: Partly edentulous with mandibular prognathism. CT CERVICAL SPINE  FINDINGS Alignment: Straightening of normal cervical lordosis. Degenerative stepwise anterolisthesis C3-C5 and retrolisthesis C5 on C6, unchanged from comparison prior. No evidence of traumatic listhesis. No abnormally widened, perched or jumped facets. Some asymmetric degenerative features along the facets, further detailed below. Normal alignment of the craniocervical and atlantoaxial articulations. Skull base and vertebrae: No acute fracture or traumatic osseous injury of the cervical spine or imaged skull base. The osseous structures appear diffusely demineralized which may limit detection of small or nondisplaced fractures. No worrisome lytic or blastic lesions. Multilevel cervical spondylitic changes including subchondral sclerosis and cystic change at multiple levels, as detailed below. Additional arthrosis about the atlantodental interval. Soft tissues and spinal canal: No pre or paravertebral fluid or swelling. No visible canal hematoma. Airways patent. Cervical carotid atherosclerosis bilaterally. No worrisome adenopathy. Disc levels: Multilevel cervical spondylitic changes are noted. There are multilevel disc osteophyte complexes. In combination with some ligamentum flavum infolding, there is moderate canal stenosis C3-4. More mild canal narrowing is seen C4-5, C5-6 and C6-7. Partial effacement of the ventral thecal sac C7-T1 as well. Multilevel facet degenerative changes with uncinate spurring and hypertrophic features are most pronounced on the left C1-2, bilaterally C3-4 and on the right C5-6. Findings result in mild-to-moderate multilevel neural foraminal narrowing with more moderate to severe narrowing C3-4. Upper chest: No acute abnormality in the upper chest or imaged lung apices. Other: Normal thyroid. Bilateral shoulder arthroplasties noted on scout view. IMPRESSION: 1. No acute intracranial abnormality. No calvarial fracture or scalp swelling. 2. No cervical spine fracture or traumatic  listhesis. 3. Background of microvascular angiopathy, parenchymal volume loss, and both intracranial and cervical carotid atherosclerosis. 4. Opacification the right maxillary sinus with some hyperostotic features suggesting chronicity. 5. Multilevel cervical spondylitic change in facet degenerative features, as detailed above. Findings result in moderate canal stenosis C3-4 with more mild canal narrowing C4-C7 as well as moderate to severe foraminal narrowing C3-4 and mild-to-moderate foraminal narrowing elsewhere throughout the cervical spine. Electronically Signed   By: Lovena Le M.D.   On: 03/04/2021 23:52   CT ABDOMEN PELVIS W CONTRAST  Result Date: 03/04/2021 CLINICAL DATA:  75 year old female with concern for pulmonary embolism. Abdominal pain. Concern for kidney stone. EXAM: CT ANGIOGRAPHY CHEST CT ABDOMEN AND PELVIS WITH CONTRAST TECHNIQUE: Multidetector CT imaging of the chest was performed using the standard protocol during bolus administration of intravenous contrast. Multiplanar CT image reconstructions and MIPs were obtained to evaluate the vascular anatomy. Multidetector CT imaging of the abdomen and pelvis was performed using the standard protocol during bolus administration of intravenous contrast. CONTRAST:  69mL OMNIPAQUE  IOHEXOL 350 MG/ML SOLN COMPARISON:  Chest CT dated 10/23/2003 and radiograph dated 03/04/2021. FINDINGS: CTA CHEST FINDINGS Cardiovascular: Mild cardiomegaly. There is coronary vascular calcification. No pericardial effusion. There is mild atherosclerotic calcification of the thoracic aorta. No aneurysmal dilatation or dissection. The origins of the great vessels of the aortic arch appear patent as visualized. Evaluation of the pulmonary arteries is very limited due to severe respiratory motion artifact and suboptimal visualization and evaluation of the peripheral branches. No large central pulmonary artery embolus identified. Mediastinum/Nodes: No hilar or mediastinal  adenopathy. The esophagus and the thyroid gland are grossly unremarkable. No mediastinal fluid collection. Lungs/Pleura: Bilateral lower lobe predominant, left greater right streaky and hazy densities may represent edema. Pneumonia is not excluded clinical correlation is recommended. No focal consolidation, pleural effusion, or pneumothorax. The central airways are patent. Musculoskeletal: Bilateral shoulder arthroplasties. No acute osseous pathology. Review of the MIP images confirms the above findings. CT ABDOMEN and PELVIS FINDINGS No intra-abdominal free air or free fluid. Hepatobiliary: The liver is unremarkable. There is intrahepatic biliary ductal dilatation. Cholecystectomy. No retained calcified stone noted in the central CBD. The common bile duct measures approximately 14 mm in diameter. Pancreas: Unremarkable. No pancreatic ductal dilatation or surrounding inflammatory changes. Spleen: Normal in size without focal abnormality. Adrenals/Urinary Tract: There is a 15 mm right adrenal nodule, indeterminate. The left adrenal glands unremarkable. There is no hydronephrosis on either side. The visualized ureters and the urinary bladder appear unremarkable Stomach/Bowel: There is sigmoid diverticulosis without active inflammatory changes. There is no bowel obstruction or active inflammation. The appendix is not visualized with certainty. No inflammatory changes identified in the right lower quadrant. Vascular/Lymphatic: Advanced aortoiliac atherosclerotic disease. The IVC is unremarkable. No portal venous gas. There is no adenopathy. Reproductive: Hysterectomy. No adnexal masses. Other: None Musculoskeletal: Osteopenia with severe degenerative changes of the spine. L2-L3 disc spacer. No acute osseous pathology. Review of the MIP images confirms the above findings. IMPRESSION: 1. No CT evidence of central pulmonary artery embolus. 2. Bilateral streaky and hazy densities may represent edema. Pneumonia is not  excluded clinical correlation is recommended. 3. Sigmoid diverticulosis. No bowel obstruction. 4. Dilatation of the intrahepatic and extrahepatic biliary tree, likely post cholecystectomy. No retained calcified stone noted. MRCP may provide better evaluation, on a nonemergent/outpatient basis, if clinically indicated. 5. Aortic Atherosclerosis (ICD10-I70.0). Electronically Signed   By: Anner Crete M.D.   On: 03/04/2021 23:56   DG Chest Portable 1 View  Result Date: 03/04/2021 CLINICAL DATA:  Altered mental status EXAM: PORTABLE CHEST 1 VIEW COMPARISON:  01/06/2021 FINDINGS: Cardiac shadow is at the upper limits of normal in size. CardioMEMS device is again seen and stable. The lungs are well aerated bilaterally. Mild central vascular congestion is seen without interstitial edema. No focal infiltrate is noted. Bilateral shoulder replacements are seen. IMPRESSION: Mild vascular congestion without interstitial edema. Electronically Signed   By: Inez Catalina M.D.   On: 03/04/2021 21:39      Rudene Re, MD 03/05/21 [phone]

## 2021-03-06 DIAGNOSIS — G9341 Metabolic encephalopathy: Secondary | ICD-10-CM

## 2021-03-06 DIAGNOSIS — N390 Urinary tract infection, site not specified: Principal | ICD-10-CM

## 2021-03-06 DIAGNOSIS — I5032 Chronic diastolic (congestive) heart failure: Secondary | ICD-10-CM

## 2021-03-06 LAB — CBC
HCT: 41.3 % (ref 36.0–46.0)
Hemoglobin: 12.8 g/dL (ref 12.0–15.0)
MCH: 27.6 pg (ref 26.0–34.0)
MCHC: 31 g/dL (ref 30.0–36.0)
MCV: 89.2 fL (ref 80.0–100.0)
Platelets: 221 10*3/uL (ref 150–400)
RBC: 4.63 MIL/uL (ref 3.87–5.11)
RDW: 17.4 % — ABNORMAL HIGH (ref 11.5–15.5)
WBC: 7.3 10*3/uL (ref 4.0–10.5)
nRBC: 0 % (ref 0.0–0.2)

## 2021-03-06 LAB — BASIC METABOLIC PANEL
Anion gap: 10 (ref 5–15)
BUN: 28 mg/dL — ABNORMAL HIGH (ref 8–23)
CO2: 33 mmol/L — ABNORMAL HIGH (ref 22–32)
Calcium: 9.9 mg/dL (ref 8.9–10.3)
Chloride: 93 mmol/L — ABNORMAL LOW (ref 98–111)
Creatinine, Ser: 1.14 mg/dL — ABNORMAL HIGH (ref 0.44–1.00)
GFR, Estimated: 50 mL/min — ABNORMAL LOW (ref >60)
Glucose, Bld: 88 mg/dL (ref 70–99)
Potassium: 4 mmol/L (ref 3.5–5.1)
Sodium: 136 mmol/L (ref 135–145)

## 2021-03-06 MED ORDER — POLYETHYLENE GLYCOL 3350 17 G PO PACK
17.0000 g | PACK | Freq: Every day | ORAL | Status: DC
  Administered 2021-03-06 – 2021-03-09 (×4): 17 g via ORAL
  Filled 2021-03-06 (×4): qty 1

## 2021-03-06 MED ORDER — LEVOTHYROXINE SODIUM 88 MCG PO TABS
88.0000 ug | ORAL_TABLET | Freq: Every day | ORAL | Status: DC
  Administered 2021-03-07 – 2021-03-11 (×4): 88 ug via ORAL
  Filled 2021-03-06 (×6): qty 1

## 2021-03-06 MED ORDER — ATORVASTATIN CALCIUM 20 MG PO TABS
40.0000 mg | ORAL_TABLET | Freq: Every day | ORAL | Status: DC
  Administered 2021-03-06 – 2021-03-10 (×5): 40 mg via ORAL
  Filled 2021-03-06 (×5): qty 2

## 2021-03-06 MED ORDER — OXYCODONE-ACETAMINOPHEN 5-325 MG PO TABS
1.0000 | ORAL_TABLET | ORAL | Status: DC | PRN
  Administered 2021-03-07 – 2021-03-10 (×8): 1 via ORAL
  Filled 2021-03-06 (×8): qty 1

## 2021-03-06 MED ORDER — LAMOTRIGINE 100 MG PO TABS
100.0000 mg | ORAL_TABLET | Freq: Two times a day (BID) | ORAL | Status: DC
  Administered 2021-03-06 – 2021-03-11 (×10): 100 mg via ORAL
  Filled 2021-03-06 (×10): qty 1

## 2021-03-06 MED ORDER — ASPIRIN EC 81 MG PO TBEC
81.0000 mg | DELAYED_RELEASE_TABLET | Freq: Every day | ORAL | Status: DC
  Administered 2021-03-06 – 2021-03-11 (×6): 81 mg via ORAL
  Filled 2021-03-06 (×6): qty 1

## 2021-03-06 MED ORDER — ROPINIROLE HCL 1 MG PO TABS
3.0000 mg | ORAL_TABLET | Freq: Three times a day (TID) | ORAL | Status: DC
  Administered 2021-03-06 – 2021-03-11 (×15): 3 mg via ORAL
  Filled 2021-03-06 (×15): qty 3

## 2021-03-06 MED ORDER — BACLOFEN 10 MG PO TABS
10.0000 mg | ORAL_TABLET | Freq: Three times a day (TID) | ORAL | Status: DC | PRN
  Administered 2021-03-06 – 2021-03-09 (×2): 10 mg via ORAL
  Filled 2021-03-06 (×4): qty 1

## 2021-03-06 MED ORDER — IPRATROPIUM-ALBUTEROL 0.5-2.5 (3) MG/3ML IN SOLN: 3.0000 mL | Freq: Four times a day (QID) | RESPIRATORY_TRACT | Status: DC | PRN

## 2021-03-06 MED ORDER — TRAZODONE HCL 100 MG PO TABS
100.0000 mg | ORAL_TABLET | Freq: Every evening | ORAL | Status: DC | PRN
  Administered 2021-03-06: 100 mg via ORAL
  Filled 2021-03-06: qty 1

## 2021-03-06 MED ORDER — DONEPEZIL HCL 5 MG PO TABS
10.0000 mg | ORAL_TABLET | Freq: Every day | ORAL | Status: DC
  Administered 2021-03-06 – 2021-03-10 (×5): 10 mg via ORAL
  Filled 2021-03-06 (×6): qty 2

## 2021-03-06 MED ORDER — DOCUSATE SODIUM 100 MG PO CAPS
200.0000 mg | ORAL_CAPSULE | Freq: Two times a day (BID) | ORAL | Status: DC
  Administered 2021-03-06 – 2021-03-09 (×7): 200 mg via ORAL
  Filled 2021-03-06 (×9): qty 2

## 2021-03-06 MED ORDER — GABAPENTIN 400 MG PO CAPS
800.0000 mg | ORAL_CAPSULE | Freq: Three times a day (TID) | ORAL | Status: DC
  Administered 2021-03-06 – 2021-03-08 (×7): 800 mg via ORAL
  Filled 2021-03-06 (×7): qty 2

## 2021-03-06 MED ORDER — DULOXETINE HCL 30 MG PO CPEP
120.0000 mg | ORAL_CAPSULE | Freq: Every day | ORAL | Status: DC
  Administered 2021-03-06 – 2021-03-11 (×6): 120 mg via ORAL
  Filled 2021-03-06 (×6): qty 4

## 2021-03-06 MED ORDER — TORSEMIDE 20 MG PO TABS
80.0000 mg | ORAL_TABLET | Freq: Every day | ORAL | Status: DC
  Administered 2021-03-06 – 2021-03-11 (×6): 80 mg via ORAL
  Filled 2021-03-06 (×6): qty 4

## 2021-03-06 MED ORDER — CLOPIDOGREL BISULFATE 75 MG PO TABS
75.0000 mg | ORAL_TABLET | Freq: Every day | ORAL | Status: DC
  Administered 2021-03-06 – 2021-03-11 (×6): 75 mg via ORAL
  Filled 2021-03-06 (×6): qty 1

## 2021-03-06 MED ORDER — SALINE SPRAY 0.65 % NA SOLN
1.0000 | NASAL | Status: DC | PRN
  Administered 2021-03-11: 1 via NASAL
  Filled 2021-03-06: qty 44

## 2021-03-06 MED ORDER — HYDROCODONE-ACETAMINOPHEN 7.5-325 MG PO TABS
1.0000 | ORAL_TABLET | Freq: Four times a day (QID) | ORAL | Status: DC | PRN
  Administered 2021-03-06 – 2021-03-11 (×2): 1 via ORAL
  Filled 2021-03-06 (×2): qty 1

## 2021-03-06 NOTE — Evaluation (Signed)
Physical Therapy Evaluation Patient Details Name: [name] MRN: [id number] DOB: 1946-03-09 Today's Date: 03/06/2021   History of Present Illness  [name] is a 89yoF who comes to [facility] from [facility] evening of 7/12 after noted worsening of mentation. Pt admitted with acute metabolic encephalopathy 2/2 UTI. PMH: Cervical spondylosis c myelopathy, Lumbar spondylosis, , RA, syncope, PONV, DM2, dCHF, CKD3a, hypoTSH, COPD on O2 at night, remote bilat TKA, remote Left knee poly exchange, recent Left knee prosthesis instability pending cardiology clearance for revision, bilat reverse TSH, bilat shoulder fractures, Lumbar fusion, cholecysectomy.  Clinical Impression  Pt admitted with above diagnosis. Pt currently with functional limitations due to the deficits listed below (see "PT Problem List"). Upon entry, pt in bed, awake and agreeable to participate. The pt is alert, pleasant, interactive, and able to provide info regarding prior level of function, both in tolerance and independence. DTR at bedside garnishes HPI/PLOF/PMH. Pt recently performed baseline bed mobility with OT session, hence this examination more focal on baseline strength/ROM in BLE to focus on maintaining strength in preparation for potential Lt knee revision in near future. Patient's performance this date reveals decreased ability, independence, and tolerance in performing all basic mobility required for performance of activities of daily living, however she appears to be fairly close to her baseline level of independence. Pt requires additional DME, heavy physical assistance, and cues for safe participate in mobility. Pt will benefit from skilled PT intervention to increase independence and safety with basic mobility in preparation for discharge to the venue listed below.        Follow Up Recommendations SNF;Supervision for mobility/OOB    Equipment Recommendations  None recommended by PT    Recommendations for Other  Services       Precautions / Restrictions Precautions Precautions: Fall Restrictions Weight Bearing Restrictions: No      Mobility  Bed Mobility Overal bed mobility: Needs Assistance Bed Mobility: Supine to Sit;Sit to Supine     Supine to sit: Max assist Sit to supine: Max assist   General bed mobility comments: assist for B LEs and trunk to EOB. Pt is able to assist from with task but overall neeing max A for safety.    Transfers                 General transfer comment: at baseline uses lift to transfer to wheelchair or recliner chair in room  Ambulation/Gait                Stairs            Wheelchair Mobility    Modified Rankin (Stroke Patients Only)       Balance Overall balance assessment: Needs assistance Sitting-balance support: Feet supported Sitting balance-Leahy Scale: Fair Sitting balance - Comments: min guard progressing to supervision ~ 10 minutes                                     Pertinent Vitals/Pain Pain Assessment: No/denies pain Pain Score: 3  Pain Location: L knee Pain Descriptors / Indicators: Aching Pain Intervention(s): Limited activity within patient's tolerance;Monitored during session;Repositioned    Home Living Family/patient expects to be discharged to:: Skilled nursing facility                 Additional Comments: Has been at [facility] for ~ 1 year    Prior Function Level of Independence: Needs assistance  Comments: Pt currently lives at [facility]. She has not stood in six weeks due to Left knee prosthesis instability (now pending revision). At facility uses lift to transfer her into recliner chair. Pt gets bed baths or they wheel her into shower with roll in shower chair (2x/wk)     Hand Dominance   Dominant Hand: Right    Extremity/Trunk Assessment   Upper Extremity Assessment Upper Extremity Assessment: Overall WFL for tasks assessed LUE Deficits / Details:  Gross arm pull/push 5/5 bilat    Lower Extremity Assessment Lower Extremity Assessment: RLE deficits/detail;LLE deficits/detail RLE Deficits / Details: composite extension is 4+/5; (able to achieve minimal clearance with hooklying bridging) LLE Deficits / Details: unable to SLR, unable to SAQ; compostive limb extension is 4/5 (able to achieve minimal clearance with hooklying bridging)       Communication   Communication: No difficulties  Cognition Arousal/Alertness: Awake/alert Behavior During Therapy: WFL for tasks assessed/performed Overall Cognitive Status: Within Functional Limits for tasks assessed                                        General Comments      Exercises     Assessment/Plan    PT Assessment Patient needs continued PT services  PT Problem List Decreased strength;Decreased range of motion;Decreased activity tolerance;Decreased balance;Decreased mobility       PT Treatment Interventions DME instruction;Balance training;Functional mobility training;Therapeutic activities;Therapeutic exercise;Patient/family education    PT Goals (Current goals can be found in the Care Plan section)  Acute Rehab PT Goals Patient Stated Goal: be able to have TKA revision and resume walking PT Goal Formulation: With patient Time For Goal Achievement: 03/20/21 Potential to Achieve Goals: Good    Frequency Min 2X/week   Barriers to discharge        Co-evaluation               AM-PAC PT "6 Clicks" Mobility  Outcome Measure Help needed turning from your back to your side while in a flat bed without using bedrails?: Total Help needed moving from lying on your back to sitting on the side of a flat bed without using bedrails?: Total Help needed moving to and from a bed to a chair (including a wheelchair)?: Total Help needed standing up from a chair using your arms (e.g., wheelchair or bedside chair)?: Total Help needed to walk in hospital room?:  Total Help needed climbing 3-5 steps with a railing? : Total 6 Click Score: 6    End of Session   Activity Tolerance: Patient tolerated treatment well;No increased pain Patient left: in bed;with family/visitor present;with call bell/phone within reach Nurse Communication: Mobility status PT Visit Diagnosis: Muscle weakness (generalized) (M62.81)    Time: 4742-5956 PT Time Calculation (min) (ACUTE ONLY): 17 min   Charges:   PT Evaluation $PT Eval High Complexity: 1 High         4:18 PM, 03/06/21 Etta Grandchild, PT, DPT Physical Therapist - [facility]  [phone] ([place])    [place] C 03/06/2021, 4:14 PM

## 2021-03-06 NOTE — TOC Initial Note (Signed)
Transition of Care [facility]) - Initial/Assessment Note    Patient Details  Name: [name] MRN: [id number] Date of Birth: 31-Mar-1946  Transition of Care [facility]) CM/SW Contact:    Eileen Stanford, LCSW Phone Number: 03/06/2021, 4:17 PM  Clinical Narrative:   Pt confirmed she had come from [facility] however does not want to return. Pt gave CSW verbal permission to speak to her daughter/POA. Per pt's daughter pt was there under LTC. PT's daughter states they would like for pt to go to [facility]. Per pt's daughter they have a long term bed they are just waiting on the referral. Referral sent.                 Expected Discharge Plan: Skilled Nursing Facility Barriers to Discharge: Continued Medical Work up   Patient Goals and CMS Choice Patient states their goals for this hospitalization and ongoing recovery are:: for pt to go to SNF   Choice offered to / list presented to : Adult Children, Patient  Expected Discharge Plan and Services Expected Discharge Plan: [place] In-house Referral: Clinical Social Work   Post Acute Care Choice: [place] Living arrangements for the past 2 months: [place]                                      Prior Living Arrangements/Services Living arrangements for the past 2 months: [place] Lives with:: Self Patient language and need for interpreter reviewed:: Yes Do you feel safe going back to the place where you live?: Yes      Need for Family Participation in Patient Care: Yes (Comment) Care giver support system in place?: Yes (comment)   Criminal Activity/Legal Involvement Pertinent to Current Situation/Hospitalization: No - Comment as needed  Activities of Daily Living Home Assistive Devices/Equipment: [employer] fast streamer, Wheelchair ADL Screening (condition at time of admission) Patient's cognitive ability adequate to safely complete daily activities?: No Is the patient deaf or  have difficulty hearing?: No Does the patient have difficulty seeing, even when wearing glasses/contacts?: No Does the patient have difficulty concentrating, remembering, or making decisions?: Yes Patient able to express need for assistance with ADLs?: Yes Does the patient have difficulty dressing or bathing?: Yes Independently performs ADLs?: No Does the patient have difficulty walking or climbing stairs?: Yes Weakness of Legs: Both Weakness of Arms/Hands: Both  Permission Sought/Granted Permission sought to share information with : Family Supports Permission granted to share information with : Yes, Verbal Permission Granted  Share Information with NAME: [name]  Permission granted to share info w AGENCY: Dealer granted to share info w Relationship: daughter     Emotional Assessment Appearance:: Appears stated age Attitude/Demeanor/Rapport: Engaged   Orientation: : Oriented to Self, Oriented to Place, Oriented to  Time, Oriented to Situation Alcohol / Substance Use: Not Applicable Psych Involvement: No (comment)  Admission diagnosis:  CHF (congestive heart failure) ([place]) [I50.9] Acute cystitis without hematuria [N30.00] Acute encephalopathy [G93.40] Acute on chronic congestive heart failure, unspecified heart failure type ([place]) [I50.9] Patient Active Problem List   Diagnosis Date Noted   Acute metabolic encephalopathy 40/34/7425   Acute respiratory failure with hypoxia ([place]) 03/05/2021   UTI (urinary tract infection) 03/05/2021   Stage 3a chronic kidney disease ([place]) 03/05/2021   Obesity, Class III, BMI 40-49.9 (morbid obesity) ([place]) 03/05/2021   Elevated lipase 03/05/2021   COPD (chronic obstructive  pulmonary disease) ([place]) 03/05/2021   Iron deficiency 08/06/2018   History of total left knee replacement (TKR) 07/18/2018   Rectal bleeding 07/05/2018   Hemorrhoids 07/05/2018   Anal itching 07/05/2018   Rotator cuff arthropathy of right shoulder     Shoulder arthritis 06/15/2017   Status post revision of total knee replacement, left 04/29/2017   Polyethylene liner wear following left total knee arthroplasty requiring isolated polyethylene liner exchange ([place]) 03/02/2017   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[place]) 03/02/2017   Unstable angina ([place]) 05/21/2016   Acute on chronic diastolic CHF (congestive heart failure) ([place]) 03/29/2016   Normal coronary arteries 2009 01/14/2015   Obesity-BMI 40 01/14/2015   Restless leg 01/14/2015   Chest pain 01/14/2015   Back pain 01/14/2015   Renal insufficiency 01/14/2015   Dementia- mild memory issues 01/14/2015   Occult blood positive stool 12/14/2014   Anemia 12/14/2014   Family history of colon cancer 12/14/2014   Change in bowel habits 12/14/2014   GERD (gastroesophageal reflux disease) 12/14/2014   Hypothyroidism 01/09/2014   Dyspnea 05/17/2012   Lymphedema 05/17/2012   Weight gain 05/17/2012   HTN (hypertension) 05/17/2012   PCP:  System, Provider Not In Pharmacy:   [facility] [place] [address] Phone: [phone] Fax: [phone]     Social Determinants of Health (SDOH) Interventions    Readmission Risk Interventions No flowsheet data found.

## 2021-03-06 NOTE — Evaluation (Signed)
Occupational Therapy Evaluation Patient Details Name: [name] MRN: [id number] DOB: 1945-12-01 Today's Date: 03/06/2021    History of Present Illness [name] is a 75 y.o. female with medical history significant for Diastolic heart failure on nighttime oxygen, stage IIIa CKD, morbid obesity, hypothyroidism, COPD on nighttime oxygen, who resides at an assisted living facility and was in her usual state of health earlier in the day but then later on became confused to where she could not recognize her daughter.  On arrival to the emergency room she was tearful and yelling out per ED providers report. New onset of PNA and UTI.   Clinical Impression   Patient presenting with decreased I in self care, balance, functional mobility/transfer, endurance, and safety awareness. Patient reports living in LTC at [facility]. She has not stood to transfer in several months and now staff use lift to transfer her into wheelchair or recliner chair during the day. Bathing from bed level or roll in shower chair. Pt is able to assist with some self care tasks. She reports being able to sit EOB [facility]TA. Pt required max A supine <>Sit but able to maintain sitting balance for 10 minutes with min guard progressing to supervision.Pt reports feeling much weaker with this hospital admission than her baseline. Patient will benefit from acute OT to increase overall independence in the areas of ADLs, functional mobility, and safety awareness in order to safely discharge to next venue of care.     Follow Up Recommendations  SNF;Supervision/Assistance - 24 hour    Equipment Recommendations  Other (comment) (defer to next venue of care)       Precautions / Restrictions Precautions Precautions: Fall      Mobility Bed Mobility Overal bed mobility: Needs Assistance Bed Mobility: Supine to Sit;Sit to Supine     Supine to sit: Max assist Sit to supine: Max assist   General bed mobility comments: assist for B LEs  and trunk to EOB. Pt is able to assist from with task but overall neeing max A for safety.    Transfers                 General transfer comment: at baseline uses lift to transfer to wheelchair or recliner chair in room    Balance Overall balance assessment: Needs assistance Sitting-balance support: Feet supported Sitting balance-Leahy Scale: Fair Sitting balance - Comments: min guard progressing to supervision ~ 10 minutes                                   ADL either performed or assessed with clinical judgement   ADL Overall ADL's : Needs assistance/impaired                                       General ADL Comments: total A for LB self care from bed level. Pt wears disposable brief they change and perform hygiene for pt at baseline. She often wears gowns after bathing. She is able to don gown with min A to pull down trunk.     Vision Patient Visual Report: No change from baseline              Pertinent Vitals/Pain Pain Assessment: 0-10 Pain Score: 3  Pain Location: L knee Pain Descriptors / Indicators: Aching Pain Intervention(s): Limited activity within patient's tolerance;Monitored  during session;Repositioned     Hand Dominance Right   Extremity/Trunk Assessment Upper Extremity Assessment Upper Extremity Assessment: LUE deficits/detail;Generalized weakness LUE Deficits / Details: decreased shoulder elevation -70 degrees with pt reporting multiple shoulder fxs in the past   Lower Extremity Assessment Lower Extremity Assessment: Defer to PT evaluation       Communication Communication Communication: No difficulties   Cognition Arousal/Alertness: Awake/alert Behavior During Therapy: WFL for tasks assessed/performed Overall Cognitive Status: Within Functional Limits for tasks assessed                                                Home Living Family/patient expects to be discharged to:: Skilled  nursing facility                                        Prior Functioning/Environment Level of Independence: Needs assistance        Comments: Pt currently lives at [place] [facility]. She has not stood in several months and facility using lift to transfer her into recliner chair. Pt gets bed baths or they wheel her into shower with roll in shower chair (2x/wk)        OT Problem List: Decreased strength;Decreased activity tolerance;Impaired balance (sitting and/or standing);Decreased safety awareness;Decreased knowledge of use of DME or AE;Decreased range of motion;Pain      OT Treatment/Interventions: Self-care/ADL training;Balance training;Therapeutic exercise;Therapeutic activities;Energy conservation;DME and/or AE instruction;Visual/perceptual remediation/compensation;Patient/family education    OT Goals(Current goals can be found in the care plan section) Acute Rehab OT Goals Patient Stated Goal: to get stronger OT Goal Formulation: With patient/family Time For Goal Achievement: 03/20/21 Potential to Achieve Goals: Good ADL Goals Pt Will Perform Grooming: with set-up;sitting Pt Will Perform Upper Body Bathing: with set-up;sitting Pt Will Perform Lower Body Bathing: with mod assist Pt/caregiver will Perform Home Exercise Program: Increased strength;Both right and left upper extremity;With theraband;With written HEP provided;With Supervision  OT Frequency: Min 2X/week   Barriers to D/C:    none known at this time          AM-PAC OT "6 Clicks" Daily Activity     Outcome Measure Help from another person eating meals?: None Help from another person taking care of personal grooming?: A Little Help from another person toileting, which includes using toliet, bedpan, or urinal?: Total Help from another person bathing (including washing, rinsing, drying)?: A Lot Help from another person to put on and taking off regular upper body clothing?: A Little Help from another  person to put on and taking off regular lower body clothing?: Total 6 Click Score: 14   End of Session Nurse Communication: Mobility status  Activity Tolerance: Patient tolerated treatment well Patient left: in bed;with call bell/phone within reach;with bed alarm set;with family/visitor present  OT Visit Diagnosis: Unsteadiness on feet (R26.81);Repeated falls (R29.6);Muscle weakness (generalized) (M62.81)                Time: 2671-2458 OT Time Calculation (min): 31 min Charges:  OT General Charges $OT Visit: 1 Visit OT Evaluation $OT Eval Moderate Complexity: 1 Mod OT Treatments $Self Care/Home Management : 8-22 mins $Therapeutic Activity: 8-22 mins  Darleen Crocker, MS, OTR/L , CBIS ascom [phone]  03/06/21, 1:59 PM

## 2021-03-06 NOTE — NC FL2 (Signed)
[place] LEVEL OF CARE SCREENING TOOL     IDENTIFICATION  Patient Name: [name] Birthdate: 1946/03/10 Sex: female Admission Date (Current Location): 03/04/2021  [facility] and [place] Number:  Engineering geologist and Address:  [facility], [address], [place], [place] [address]      Provider Number: [id number]  Attending Physician Name and Address:  Wyvonnia Dusky, MD  Relative Name and Phone Number:       Current Level of Care: Hospital Recommended Level of Care: [place] Prior Approval Number:    Date Approved/Denied:   PASRR Number: [id number]  Discharge Plan: SNF    Current Diagnoses: Patient Active Problem List   Diagnosis Date Noted   Acute metabolic encephalopathy 93/23/5573   Acute respiratory failure with hypoxia ([place]) 03/05/2021   UTI (urinary tract infection) 03/05/2021   Stage 3a chronic kidney disease ([place]) 03/05/2021   Obesity, Class III, BMI 40-49.9 (morbid obesity) ([place]) 03/05/2021   Elevated lipase 03/05/2021   COPD (chronic obstructive pulmonary disease) ([place]) 03/05/2021   Iron deficiency 08/06/2018   History of total left knee replacement (TKR) 07/18/2018   Rectal bleeding 07/05/2018   Hemorrhoids 07/05/2018   Anal itching 07/05/2018   Rotator cuff arthropathy of right shoulder    Shoulder arthritis 06/15/2017   Status post revision of total knee replacement, left 04/29/2017   Polyethylene liner wear following left total knee arthroplasty requiring isolated polyethylene liner exchange ([place]) 03/02/2017   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[place]) 03/02/2017   Unstable angina ([place]) 05/21/2016   Acute on chronic diastolic CHF (congestive heart failure) ([place]) 03/29/2016   Normal coronary arteries 2009 01/14/2015   Obesity-BMI 40 01/14/2015   Restless leg 01/14/2015   Chest pain 01/14/2015   Back pain 01/14/2015   Renal insufficiency 01/14/2015   Dementia- mild  memory issues 01/14/2015   Occult blood positive stool 12/14/2014   Anemia 12/14/2014   Family history of colon cancer 12/14/2014   Change in bowel habits 12/14/2014   GERD (gastroesophageal reflux disease) 12/14/2014   Hypothyroidism 01/09/2014   Dyspnea 05/17/2012   Lymphedema 05/17/2012   Weight gain 05/17/2012   HTN (hypertension) 05/17/2012    Orientation RESPIRATION BLADDER Height & Weight     Self, Time, Situation, Place  O2 ([place] 2L) Incontinent Weight: 227 lb 6.4 oz (103.1 kg) Height:  5\' 6"  (167.6 cm)  BEHAVIORAL SYMPTOMS/MOOD NEUROLOGICAL BOWEL NUTRITION STATUS      Incontinent Diet (heart healthy, thin liquids)  AMBULATORY STATUS COMMUNICATION OF NEEDS Skin   Limited Assist Verbally Normal                       Personal Care Assistance Level of Assistance  Dressing, Feeding, Bathing Bathing Assistance: Limited assistance Feeding assistance: Independent Dressing Assistance: Limited assistance     Functional Limitations Info  Sight, Hearing, Speech Sight Info: Adequate Hearing Info: Adequate Speech Info: Adequate    SPECIAL CARE FACTORS FREQUENCY  PT (By licensed PT), OT (By licensed OT)     PT Frequency: 5x OT Frequency: 5x            Contractures Contractures Info: Not present    Additional Factors Info  Code Status, Allergies Code Status Info: full code Allergies Info: no known allergies           Current Medications (03/06/2021):  This is the current hospital active medication list Current Facility-Administered Medications  Medication Dose Route Frequency Provider  Last Rate Last Admin   acetaminophen (TYLENOL) tablet 650 mg  650 mg Oral Q6H PRN Athena Masse, MD   650 mg at 03/06/21 0551   Or   acetaminophen (TYLENOL) suppository 650 mg  650 mg Rectal Q6H PRN Athena Masse, MD       baclofen (LIORESAL) tablet 10 mg  10 mg Oral TID PRN Wyvonnia Dusky, MD       carvedilol (COREG) tablet 6.25 mg  6.25 mg Oral BID WC Judd Gaudier  V, MD   6.25 mg at 03/06/21 1005   cefTRIAXone (ROCEPHIN) 1 g in sodium chloride 0.9 % 100 mL IVPB  1 g Intravenous Q24H Judd Gaudier V, MD 200 mL/hr at 03/06/21 0000 1 g at 03/06/21 0000   docusate sodium (COLACE) capsule 200 mg  200 mg Oral BID Wyvonnia Dusky, MD   200 mg at 03/06/21 1005   enoxaparin (LOVENOX) injection 62.5 mg  0.5 mg/kg Subcutaneous Q24H Judd Gaudier V, MD   62.5 mg at 03/06/21 1006   gabapentin (NEURONTIN) capsule 800 mg  800 mg Oral TID Wyvonnia Dusky, MD   800 mg at 03/06/21 1005   morphine 4 MG/ML injection 4 mg  4 mg Intravenous Q3H PRN Merlyn Lot, MD   4 mg at 03/06/21 0556   ondansetron (ZOFRAN) tablet 4 mg  4 mg Oral Q6H PRN Athena Masse, MD       Or   ondansetron [facility]) injection 4 mg  4 mg Intravenous Q6H PRN Athena Masse, MD       torsemide [facility]) tablet 80 mg  80 mg Oral Daily Wyvonnia Dusky, MD   80 mg at 03/06/21 1005     Discharge Medications: Please see discharge summary for a list of discharge medications.  Relevant Imaging Results:  Relevant Lab Results:   Additional Information ssn: [id number]  Eileen Stanford, LCSW

## 2021-03-06 NOTE — Progress Notes (Addendum)
PROGRESS NOTE    [name]  ZOX:[id number] DOB: October 11, 1945 DOA: 03/04/2021 PCP: System, Provider Not In   Assessment & Plan:   Principal Problem:   Acute metabolic encephalopathy Active Problems:   Acute on chronic diastolic CHF (congestive heart failure) (HCC)   Acute respiratory failure with hypoxia (HCC)   UTI (urinary tract infection)   Stage 3a chronic kidney disease (HCC)   Obesity, Class III, BMI 40-49.9 (morbid obesity) (HCC)   Elevated lipase   COPD (chronic obstructive pulmonary disease) (HCC)   Acute metabolic encephalopathy: likely secondary to UTI. Urine cx is growing e. coli, sens pending. Continue on IV rocephin. CT head with no acute findings. Close to baseline   UTI: UA is positive. Urine cx is growing e. coli, sens pending. Continue on IV rocephin  Chronic diastolic CHF: as per cardio. Continue on coreg and home dose of torsemide as per cardio. Monitor I/Os and daily weights    Elevated lipase: at 135. CT abdomen and pelvis shows post cholecystectomy changes   CKDIIIa: Cr is trending down from day prior   Morbid obesity: BMI >35. Inaccurate was likely put in on 7/14. Complicates overall care and prognosis    COPD: w/o exacerbation. Continue on bronchodilators  RLS: restarted home dose of ropinirole  HLD:  continue on statin  Hypothyroidism: continue on home dose of levothyroxine  Depression: severity unknown. Continue on home dose of duloxetine    DVT prophylaxis: lovenox  Code Status:  full Family Communication: discussed pt's care w/ pt's family at bedside and answered their questions  Disposition Plan: depends on PT/OT recs   Level of care: Progressive Cardiac  Status is: Inpatient  Remains inpatient appropriate because:IV treatments appropriate due to intensity of illness or inability to take PO and Inpatient level of care appropriate due to severity of illness  Dispo: The patient is from: Home              Anticipated d/c is to:  Home              Patient currently is not medically stable to d/c.   Difficult to place patient : unclear       Consultants:  Cardio   Procedures:  Antimicrobials: rocephin    Subjective: Pt c/o fatigue   Objective: Vitals:   03/05/21 2006 03/06/21 0400 03/06/21 0500 03/06/21 0740  BP: 124/60 (!) 128/52  (!) 124/52  Pulse: 67 98  64  Resp: 17   14  Temp: (!) 97.5 F (36.4 C) 98.6 F (37 C)  99.1 F (37.3 C)  TempSrc: Oral Oral  Oral  SpO2: 100% 98%  95%  Weight:   103.1 kg   Height:        Intake/Output Summary (Last 24 hours) at 03/06/2021 0754 Last data filed at 03/06/2021 0600 Gross per 24 hour  Intake 480 ml  Output 1000 ml  Net -520 ml   Filed Weights   03/04/21 2112 03/05/21 1750 03/06/21 0500  Weight: 125 kg 104 kg 103.1 kg    Examination:  General exam: Appears comfortable   Respiratory system: decreased breath sounds b/l  Cardiovascular system: S1/S2+. No rubs or clicks  Gastrointestinal system: Abd is soft, NT, obese & hypoactive bowel sounds  Central nervous system: Alert and oriented. Moves all extremties  Psychiatry: Judgement and insight appears normal. Flat mood and affect    Data Reviewed: I have personally reviewed following labs and imaging studies  CBC: Recent Labs  Lab 03/04/21 2130  03/05/21 0058 03/06/21 0513  WBC 8.7 7.8 7.3  HGB 12.9 11.8* 12.8  HCT 41.1 37.6 41.3  MCV 88.4 90.2 89.2  PLT 221 224 245   Basic Metabolic Panel: Recent Labs  Lab 03/04/21 2130 03/05/21 0058 03/06/21 0513  NA 136  --  136  K 4.6  --  4.0  CL 94*  --  93*  CO2 29  --  33*  GLUCOSE 98  --  88  BUN 34*  --  28*  CREATININE 1.53* 1.60* 1.14*  CALCIUM 10.0  --  9.9   GFR: Estimated Creatinine Clearance: 50.9 mL/min (A) (by C-G formula based on SCr of 1.14 mg/dL (H)). Liver Function Tests: Recent Labs  Lab 03/04/21 2130  AST 25  ALT 16  ALKPHOS 116  BILITOT 1.2  PROT 7.6  ALBUMIN 3.9   Recent Labs  Lab 03/04/21 2130   LIPASE 135*   No results for input(s): AMMONIA in the last 168 hours. Coagulation Profile: No results for input(s): INR, PROTIME in the last 168 hours. Cardiac Enzymes: No results for input(s): CKTOTAL, CKMB, CKMBINDEX, TROPONINI in the last 168 hours. BNP (last 3 results) No results for input(s): PROBNP in the last 8760 hours. HbA1C: No results for input(s): HGBA1C in the last 72 hours. CBG: No results for input(s): GLUCAP in the last 168 hours. Lipid Profile: No results for input(s): CHOL, HDL, LDLCALC, TRIG, CHOLHDL, LDLDIRECT in the last 72 hours. Thyroid Function Tests: No results for input(s): TSH, T4TOTAL, FREET4, T3FREE, THYROIDAB in the last 72 hours. Anemia Panel: No results for input(s): VITAMINB12, FOLATE, FERRITIN, TIBC, IRON, RETICCTPCT in the last 72 hours. Sepsis Labs: Recent Labs  Lab 03/04/21 2130  LATICACIDVEN 1.1    Recent Results (from the past 240 hour(s))  Resp Panel by RT-PCR (Flu A&B, Covid) Nasopharyngeal Swab     Status: None   Collection Time: 03/04/21  9:33 PM   Specimen: Nasopharyngeal Swab; Nasopharyngeal(NP) swabs in vial transport medium  Result Value Ref Range Status   SARS Coronavirus 2 by RT PCR NEGATIVE NEGATIVE Final    Comment: (NOTE) SARS-CoV-2 target nucleic acids are NOT DETECTED.  The SARS-CoV-2 RNA is generally detectable in upper respiratory specimens during the acute phase of infection. The lowest concentration of SARS-CoV-2 viral copies this assay can detect is 138 copies/mL. A negative result does not preclude SARS-Cov-2 infection and should not be used as the sole basis for treatment or other patient management decisions. A negative result may occur with  improper specimen collection/handling, submission of specimen other than nasopharyngeal swab, presence of viral mutation(s) within the areas targeted by this assay, and inadequate number of viral copies(<138 copies/mL). A negative result must be combined with clinical  observations, patient history, and epidemiological information. The expected result is Negative.  Fact Sheet for Patients:  EntrepreneurPulse.com.au  Fact Sheet for Healthcare Providers:  IncredibleEmployment.be  This test is no t yet approved or cleared by the [place] FDA and  has been authorized for detection and/or diagnosis of SARS-CoV-2 by FDA under an Emergency Use Authorization (EUA). This EUA will remain  in effect (meaning this test can be used) for the duration of the COVID-19 declaration under Section 564(b)(1) of the Act, 21 U.S.C.section 360bbb-3(b)(1), unless the authorization is terminated  or revoked sooner.       Influenza A by PCR NEGATIVE NEGATIVE Final   Influenza B by PCR NEGATIVE NEGATIVE Final    Comment: (NOTE) The Xpert Xpress SARS-CoV-2/FLU/RSV plus assay is intended  as an aid in the diagnosis of influenza from Nasopharyngeal swab specimens and should not be used as a sole basis for treatment. Nasal washings and aspirates are unacceptable for Xpert Xpress SARS-CoV-2/FLU/RSV testing.  Fact Sheet for Patients: EntrepreneurPulse.com.au  Fact Sheet for Healthcare Providers: IncredibleEmployment.be  This test is not yet approved or cleared by the [place] FDA and has been authorized for detection and/or diagnosis of SARS-CoV-2 by FDA under an Emergency Use Authorization (EUA). This EUA will remain in effect (meaning this test can be used) for the duration of the COVID-19 declaration under Section 564(b)(1) of the Act, 21 U.S.C. section 360bbb-3(b)(1), unless the authorization is terminated or revoked.  Performed at [facility], [address], [place], [place] [address]          Radiology Studies: CT Head Wo Contrast  Result Date: 03/04/2021 CLINICAL DATA:  Moaning and agitation, back and neck pain EXAM: CT HEAD WITHOUT CONTRAST CT CERVICAL SPINE  WITHOUT CONTRAST TECHNIQUE: Multidetector CT imaging of the head and cervical spine was performed following the standard protocol without intravenous contrast. Multiplanar CT image reconstructions of the cervical spine were also generated. COMPARISON:  CT 01/06/2021 FINDINGS: CT HEAD FINDINGS Brain: No evidence of acute infarction, hemorrhage, hydrocephalus, extra-axial collection, visible mass lesion or mass effect. Symmetric prominence of the ventricles, cisterns and sulci compatible with parenchymal volume loss. Patchy areas of [place] matter hypoattenuation are most compatible with chronic microvascular angiopathy. Vascular: Atherosclerotic calcification of the carotid siphons. No hyperdense vessel. Skull: No calvarial fracture or suspicious osseous lesion. No scalp swelling or hematoma. Sinuses/Orbits: Complete opacification of the right maxillary sinus. Some minimally hyperostotic changes. No worrisome stranding or inflammation along the sinus perimeter. Remaining paranasal sinuses are predominantly clear. Mastoid air cells are well aerated. Middle ear cavities are clear. Orbital structures are unremarkable aside from prior lens extractions. Other: Partly edentulous with mandibular prognathism. CT CERVICAL SPINE FINDINGS Alignment: Straightening of normal cervical lordosis. Degenerative stepwise anterolisthesis C3-C5 and retrolisthesis C5 on C6, unchanged from comparison prior. No evidence of traumatic listhesis. No abnormally widened, perched or jumped facets. Some asymmetric degenerative features along the facets, further detailed below. Normal alignment of the craniocervical and atlantoaxial articulations. Skull base and vertebrae: No acute fracture or traumatic osseous injury of the cervical spine or imaged skull base. The osseous structures appear diffusely demineralized which may limit detection of small or nondisplaced fractures. No worrisome lytic or blastic lesions. Multilevel cervical spondylitic  changes including subchondral sclerosis and cystic change at multiple levels, as detailed below. Additional arthrosis about the atlantodental interval. Soft tissues and spinal canal: No pre or paravertebral fluid or swelling. No visible canal hematoma. Airways patent. Cervical carotid atherosclerosis bilaterally. No worrisome adenopathy. Disc levels: Multilevel cervical spondylitic changes are noted. There are multilevel disc osteophyte complexes. In combination with some ligamentum flavum infolding, there is moderate canal stenosis C3-4. More mild canal narrowing is seen C4-5, C5-6 and C6-7. Partial effacement of the ventral thecal sac C7-T1 as well. Multilevel facet degenerative changes with uncinate spurring and hypertrophic features are most pronounced on the left C1-2, bilaterally C3-4 and on the right C5-6. Findings result in mild-to-moderate multilevel neural foraminal narrowing with more moderate to severe narrowing C3-4. Upper chest: No acute abnormality in the upper chest or imaged lung apices. Other: Normal thyroid. Bilateral shoulder arthroplasties noted on scout view. IMPRESSION: 1. No acute intracranial abnormality. No calvarial fracture or scalp swelling. 2. No cervical spine fracture or traumatic listhesis. 3. Background of microvascular angiopathy, parenchymal  volume loss, and both intracranial and cervical carotid atherosclerosis. 4. Opacification the right maxillary sinus with some hyperostotic features suggesting chronicity. 5. Multilevel cervical spondylitic change in facet degenerative features, as detailed above. Findings result in moderate canal stenosis C3-4 with more mild canal narrowing C4-C7 as well as moderate to severe foraminal narrowing C3-4 and mild-to-moderate foraminal narrowing elsewhere throughout the cervical spine. Electronically Signed   By: Lovena Le M.D.   On: 03/04/2021 23:52   CT Angio Chest PE W and/or Wo Contrast  Result Date: 03/04/2021 CLINICAL DATA:   75 year old female with concern for pulmonary embolism. Abdominal pain. Concern for kidney stone. EXAM: CT ANGIOGRAPHY CHEST CT ABDOMEN AND PELVIS WITH CONTRAST TECHNIQUE: Multidetector CT imaging of the chest was performed using the standard protocol during bolus administration of intravenous contrast. Multiplanar CT image reconstructions and MIPs were obtained to evaluate the vascular anatomy. Multidetector CT imaging of the abdomen and pelvis was performed using the standard protocol during bolus administration of intravenous contrast. CONTRAST:  66mL OMNIPAQUE IOHEXOL 350 MG/ML SOLN COMPARISON:  Chest CT dated 10/23/2003 and radiograph dated 03/04/2021. FINDINGS: CTA CHEST FINDINGS Cardiovascular: Mild cardiomegaly. There is coronary vascular calcification. No pericardial effusion. There is mild atherosclerotic calcification of the thoracic aorta. No aneurysmal dilatation or dissection. The origins of the great vessels of the aortic arch appear patent as visualized. Evaluation of the pulmonary arteries is very limited due to severe respiratory motion artifact and suboptimal visualization and evaluation of the peripheral branches. No large central pulmonary artery embolus identified. Mediastinum/Nodes: No hilar or mediastinal adenopathy. The esophagus and the thyroid gland are grossly unremarkable. No mediastinal fluid collection. Lungs/Pleura: Bilateral lower lobe predominant, left greater right streaky and hazy densities may represent edema. Pneumonia is not excluded clinical correlation is recommended. No focal consolidation, pleural effusion, or pneumothorax. The central airways are patent. Musculoskeletal: Bilateral shoulder arthroplasties. No acute osseous pathology. Review of the MIP images confirms the above findings. CT ABDOMEN and PELVIS FINDINGS No intra-abdominal free air or free fluid. Hepatobiliary: The liver is unremarkable. There is intrahepatic biliary ductal dilatation. Cholecystectomy. No  retained calcified stone noted in the central CBD. The common bile duct measures approximately 14 mm in diameter. Pancreas: Unremarkable. No pancreatic ductal dilatation or surrounding inflammatory changes. Spleen: Normal in size without focal abnormality. Adrenals/Urinary Tract: There is a 15 mm right adrenal nodule, indeterminate. The left adrenal glands unremarkable. There is no hydronephrosis on either side. The visualized ureters and the urinary bladder appear unremarkable Stomach/Bowel: There is sigmoid diverticulosis without active inflammatory changes. There is no bowel obstruction or active inflammation. The appendix is not visualized with certainty. No inflammatory changes identified in the right lower quadrant. Vascular/Lymphatic: Advanced aortoiliac atherosclerotic disease. The IVC is unremarkable. No portal venous gas. There is no adenopathy. Reproductive: Hysterectomy. No adnexal masses. Other: None Musculoskeletal: Osteopenia with severe degenerative changes of the spine. L2-L3 disc spacer. No acute osseous pathology. Review of the MIP images confirms the above findings. IMPRESSION: 1. No CT evidence of central pulmonary artery embolus. 2. Bilateral streaky and hazy densities may represent edema. Pneumonia is not excluded clinical correlation is recommended. 3. Sigmoid diverticulosis. No bowel obstruction. 4. Dilatation of the intrahepatic and extrahepatic biliary tree, likely post cholecystectomy. No retained calcified stone noted. MRCP may provide better evaluation, on a nonemergent/outpatient basis, if clinically indicated. 5. Aortic Atherosclerosis (ICD10-I70.0). Electronically Signed   By: Anner Crete M.D.   On: 03/04/2021 23:56   CT Cervical Spine Wo Contrast  Result Date: 03/04/2021 CLINICAL  DATA:  Moaning and agitation, back and neck pain EXAM: CT HEAD WITHOUT CONTRAST CT CERVICAL SPINE WITHOUT CONTRAST TECHNIQUE: Multidetector CT imaging of the head and cervical spine was performed  following the standard protocol without intravenous contrast. Multiplanar CT image reconstructions of the cervical spine were also generated. COMPARISON:  CT 01/06/2021 FINDINGS: CT HEAD FINDINGS Brain: No evidence of acute infarction, hemorrhage, hydrocephalus, extra-axial collection, visible mass lesion or mass effect. Symmetric prominence of the ventricles, cisterns and sulci compatible with parenchymal volume loss. Patchy areas of [place] matter hypoattenuation are most compatible with chronic microvascular angiopathy. Vascular: Atherosclerotic calcification of the carotid siphons. No hyperdense vessel. Skull: No calvarial fracture or suspicious osseous lesion. No scalp swelling or hematoma. Sinuses/Orbits: Complete opacification of the right maxillary sinus. Some minimally hyperostotic changes. No worrisome stranding or inflammation along the sinus perimeter. Remaining paranasal sinuses are predominantly clear. Mastoid air cells are well aerated. Middle ear cavities are clear. Orbital structures are unremarkable aside from prior lens extractions. Other: Partly edentulous with mandibular prognathism. CT CERVICAL SPINE FINDINGS Alignment: Straightening of normal cervical lordosis. Degenerative stepwise anterolisthesis C3-C5 and retrolisthesis C5 on C6, unchanged from comparison prior. No evidence of traumatic listhesis. No abnormally widened, perched or jumped facets. Some asymmetric degenerative features along the facets, further detailed below. Normal alignment of the craniocervical and atlantoaxial articulations. Skull base and vertebrae: No acute fracture or traumatic osseous injury of the cervical spine or imaged skull base. The osseous structures appear diffusely demineralized which may limit detection of small or nondisplaced fractures. No worrisome lytic or blastic lesions. Multilevel cervical spondylitic changes including subchondral sclerosis and cystic change at multiple levels, as detailed below.  Additional arthrosis about the atlantodental interval. Soft tissues and spinal canal: No pre or paravertebral fluid or swelling. No visible canal hematoma. Airways patent. Cervical carotid atherosclerosis bilaterally. No worrisome adenopathy. Disc levels: Multilevel cervical spondylitic changes are noted. There are multilevel disc osteophyte complexes. In combination with some ligamentum flavum infolding, there is moderate canal stenosis C3-4. More mild canal narrowing is seen C4-5, C5-6 and C6-7. Partial effacement of the ventral thecal sac C7-T1 as well. Multilevel facet degenerative changes with uncinate spurring and hypertrophic features are most pronounced on the left C1-2, bilaterally C3-4 and on the right C5-6. Findings result in mild-to-moderate multilevel neural foraminal narrowing with more moderate to severe narrowing C3-4. Upper chest: No acute abnormality in the upper chest or imaged lung apices. Other: Normal thyroid. Bilateral shoulder arthroplasties noted on scout view. IMPRESSION: 1. No acute intracranial abnormality. No calvarial fracture or scalp swelling. 2. No cervical spine fracture or traumatic listhesis. 3. Background of microvascular angiopathy, parenchymal volume loss, and both intracranial and cervical carotid atherosclerosis. 4. Opacification the right maxillary sinus with some hyperostotic features suggesting chronicity. 5. Multilevel cervical spondylitic change in facet degenerative features, as detailed above. Findings result in moderate canal stenosis C3-4 with more mild canal narrowing C4-C7 as well as moderate to severe foraminal narrowing C3-4 and mild-to-moderate foraminal narrowing elsewhere throughout the cervical spine. Electronically Signed   By: Lovena Le M.D.   On: 03/04/2021 23:52   CT ABDOMEN PELVIS W CONTRAST  Result Date: 03/04/2021 CLINICAL DATA:  75 year old female with concern for pulmonary embolism. Abdominal pain. Concern for kidney stone. EXAM: CT  ANGIOGRAPHY CHEST CT ABDOMEN AND PELVIS WITH CONTRAST TECHNIQUE: Multidetector CT imaging of the chest was performed using the standard protocol during bolus administration of intravenous contrast. Multiplanar CT image reconstructions and MIPs were obtained to evaluate the vascular anatomy.  Multidetector CT imaging of the abdomen and pelvis was performed using the standard protocol during bolus administration of intravenous contrast. CONTRAST:  50mL OMNIPAQUE IOHEXOL 350 MG/ML SOLN COMPARISON:  Chest CT dated 10/23/2003 and radiograph dated 03/04/2021. FINDINGS: CTA CHEST FINDINGS Cardiovascular: Mild cardiomegaly. There is coronary vascular calcification. No pericardial effusion. There is mild atherosclerotic calcification of the thoracic aorta. No aneurysmal dilatation or dissection. The origins of the great vessels of the aortic arch appear patent as visualized. Evaluation of the pulmonary arteries is very limited due to severe respiratory motion artifact and suboptimal visualization and evaluation of the peripheral branches. No large central pulmonary artery embolus identified. Mediastinum/Nodes: No hilar or mediastinal adenopathy. The esophagus and the thyroid gland are grossly unremarkable. No mediastinal fluid collection. Lungs/Pleura: Bilateral lower lobe predominant, left greater right streaky and hazy densities may represent edema. Pneumonia is not excluded clinical correlation is recommended. No focal consolidation, pleural effusion, or pneumothorax. The central airways are patent. Musculoskeletal: Bilateral shoulder arthroplasties. No acute osseous pathology. Review of the MIP images confirms the above findings. CT ABDOMEN and PELVIS FINDINGS No intra-abdominal free air or free fluid. Hepatobiliary: The liver is unremarkable. There is intrahepatic biliary ductal dilatation. Cholecystectomy. No retained calcified stone noted in the central CBD. The common bile duct measures approximately 14 mm in  diameter. Pancreas: Unremarkable. No pancreatic ductal dilatation or surrounding inflammatory changes. Spleen: Normal in size without focal abnormality. Adrenals/Urinary Tract: There is a 15 mm right adrenal nodule, indeterminate. The left adrenal glands unremarkable. There is no hydronephrosis on either side. The visualized ureters and the urinary bladder appear unremarkable Stomach/Bowel: There is sigmoid diverticulosis without active inflammatory changes. There is no bowel obstruction or active inflammation. The appendix is not visualized with certainty. No inflammatory changes identified in the right lower quadrant. Vascular/Lymphatic: Advanced aortoiliac atherosclerotic disease. The IVC is unremarkable. No portal venous gas. There is no adenopathy. Reproductive: Hysterectomy. No adnexal masses. Other: None Musculoskeletal: Osteopenia with severe degenerative changes of the spine. L2-L3 disc spacer. No acute osseous pathology. Review of the MIP images confirms the above findings. IMPRESSION: 1. No CT evidence of central pulmonary artery embolus. 2. Bilateral streaky and hazy densities may represent edema. Pneumonia is not excluded clinical correlation is recommended. 3. Sigmoid diverticulosis. No bowel obstruction. 4. Dilatation of the intrahepatic and extrahepatic biliary tree, likely post cholecystectomy. No retained calcified stone noted. MRCP may provide better evaluation, on a nonemergent/outpatient basis, if clinically indicated. 5. Aortic Atherosclerosis (ICD10-I70.0). Electronically Signed   By: Anner Crete M.D.   On: 03/04/2021 23:56   DG Chest Portable 1 View  Result Date: 03/04/2021 CLINICAL DATA:  Altered mental status EXAM: PORTABLE CHEST 1 VIEW COMPARISON:  01/06/2021 FINDINGS: Cardiac shadow is at the upper limits of normal in size. CardioMEMS device is again seen and stable. The lungs are well aerated bilaterally. Mild central vascular congestion is seen without interstitial edema. No  focal infiltrate is noted. Bilateral shoulder replacements are seen. IMPRESSION: Mild vascular congestion without interstitial edema. Electronically Signed   By: Inez Catalina M.D.   On: 03/04/2021 21:39   ECHOCARDIOGRAM COMPLETE  Result Date: 03/05/2021    ECHOCARDIOGRAM REPORT   Patient Name:   AKIYAH EPPOLITO Josey Date of Exam: 03/05/2021 Medical Rec #:  [id number]        Height:       66.0 in Accession #:    [id number]       Weight:       275.6 lb Date of Birth:  [birth year]         BSA:          2.292 m Patient Age:    64 years         BP:           144/61 mmHg Patient Gender: F                HR:           63 bpm. Exam Location:  [facility] Procedure: 2D Echo, Color Doppler, Cardiac Doppler and Intracardiac            Opacification Agent Indications:     I50.31 CHF-Acute Diastolic  History:         Patient has prior history of Echocardiogram examinations. CKD                  and TIA, Signs/Symptoms:Dementia; Risk Factors:Hypertension,                  Dyslipidemia, Sleep Apnea and Diabetes.  Sonographer:     Charmayne Sheer RDCS (AE) Referring Phys:  [id number] Athena Masse Diagnosing Phys: Kate Sable MD  Sonographer Comments: Suboptimal apical window and no subcostal window. Global longitudinal strain was attempted. IMPRESSIONS  1. Left ventricular ejection fraction, by estimation, is 60 to 65%. The left ventricle has normal function. The left ventricle has no regional wall motion abnormalities. There is mild left ventricular hypertrophy. Left ventricular diastolic parameters are consistent with Grade I diastolic dysfunction (impaired relaxation).  2. Right ventricular systolic function is normal. The right ventricular size is normal.  3. The mitral valve is normal in structure. No evidence of mitral valve regurgitation.  4. The aortic valve is tricuspid. Aortic valve regurgitation is not visualized. Mild aortic valve sclerosis is present, with no evidence of aortic valve stenosis.  5. The inferior vena cava is normal  in size with greater than 50% respiratory variability, suggesting right atrial pressure of 3 mmHg. FINDINGS  Left Ventricle: Left ventricular ejection fraction, by estimation, is 60 to 65%. The left ventricle has normal function. The left ventricle has no regional wall motion abnormalities. The left ventricular internal cavity size was normal in size. There is  mild left ventricular hypertrophy. Left ventricular diastolic parameters are consistent with Grade I diastolic dysfunction (impaired relaxation). Right Ventricle: The right ventricular size is normal. No increase in right ventricular wall thickness. Right ventricular systolic function is normal. Left Atrium: Left atrial size was normal in size. Right Atrium: Right atrial size was normal in size. Pericardium: There is no evidence of pericardial effusion. Mitral Valve: The mitral valve is normal in structure. No evidence of mitral valve regurgitation. MV peak gradient, 3.3 mmHg. The mean mitral valve gradient is 1.0 mmHg. Tricuspid Valve: The tricuspid valve is normal in structure. Tricuspid valve regurgitation is not demonstrated. Aortic Valve: The aortic valve is tricuspid. Aortic valve regurgitation is not visualized. Mild aortic valve sclerosis is present, with no evidence of aortic valve stenosis. Aortic valve mean gradient measures 4.0 mmHg. Aortic valve peak gradient measures 8.8 mmHg. Aortic valve area, by VTI measures 2.71 cm. Pulmonic Valve: The pulmonic valve was normal in structure. Pulmonic valve regurgitation is not visualized. Aorta: The aortic root is normal in size and structure. Venous: The inferior vena cava is normal in size with greater than 50% respiratory variability, suggesting right atrial pressure of 3 mmHg. IAS/Shunts: No atrial level shunt detected by color flow Doppler.  LEFT VENTRICLE PLAX 2D LVIDd:  4.70 cm  Diastology LVIDs:         2.70 cm  LV e' medial:    7.72 cm/s LV PW:         1.00 cm  LV E/e' medial:  7.0 LV IVS:         1.20 cm  LV e' lateral:   8.49 cm/s LVOT diam:     2.10 cm  LV E/e' lateral: 6.4 LV SV:         73 LV SV Index:   32 LVOT Area:     3.46 cm  RIGHT VENTRICLE RV Basal diam:  3.90 cm LEFT ATRIUM           Index       RIGHT ATRIUM           Index LA diam:      3.50 cm 1.53 cm/m  RA Area:     10.70 cm LA Vol (A4C): 42.9 ml 18.72 ml/m RA Volume:   21.60 ml  9.43 ml/m  AORTIC VALVE                   PULMONIC VALVE AV Area (Vmax):    2.39 cm    PV Vmax:       1.31 m/s AV Area (Vmean):   2.79 cm    PV Vmean:      95.200 cm/s AV Area (VTI):     2.71 cm    PV VTI:        0.276 m AV Vmax:           148.00 cm/s PV Peak grad:  6.9 mmHg AV Vmean:          95.100 cm/s PV Mean grad:  4.0 mmHg AV VTI:            0.271 m AV Peak Grad:      8.8 mmHg AV Mean Grad:      4.0 mmHg LVOT Vmax:         102.00 cm/s LVOT Vmean:        76.600 cm/s LVOT VTI:          0.212 m LVOT/AV VTI ratio: 0.78  AORTA Ao Root diam: 3.20 cm MITRAL VALVE MV Area (PHT): 2.58 cm    SHUNTS MV Area VTI:   3.30 cm    Systemic VTI:  0.21 m MV Peak grad:  3.3 mmHg    Systemic Diam: 2.10 cm MV Mean grad:  1.0 mmHg MV Vmax:       0.91 m/s MV Vmean:      43.5 cm/s MV Decel Time: 294 msec MV E velocity: 54.40 cm/s MV A velocity: 92.10 cm/s MV E/A ratio:  0.59 Kate Sable MD Electronically signed by Kate Sable MD Signature Date/Time: 03/05/2021/1:19:45 PM    Final         Scheduled Meds:  carvedilol  6.25 mg Oral BID WC   enoxaparin (LOVENOX) injection  0.5 mg/kg Subcutaneous Q24H   furosemide  40 mg Intravenous Q12H   Continuous Infusions:  cefTRIAXone (ROCEPHIN)  IV 1 g (03/06/21 0000)     LOS: 1 day    Time spent: 31 mins     Wyvonnia Dusky, MD [facility] Pager [phone]  If 7PM-7AM, please contact night-coverage 03/06/2021, 7:54 AM

## 2021-03-07 LAB — BASIC METABOLIC PANEL
Anion gap: 9 (ref 5–15)
BUN: 26 mg/dL — ABNORMAL HIGH (ref 8–23)
CO2: 35 mmol/L — ABNORMAL HIGH (ref 22–32)
Calcium: 9.8 mg/dL (ref 8.9–10.3)
Chloride: 91 mmol/L — ABNORMAL LOW (ref 98–111)
Creatinine, Ser: 1.09 mg/dL — ABNORMAL HIGH (ref 0.44–1.00)
GFR, Estimated: 53 mL/min — ABNORMAL LOW (ref >60)
Glucose, Bld: 99 mg/dL (ref 70–99)
Potassium: 3.7 mmol/L (ref 3.5–5.1)
Sodium: 135 mmol/L (ref 135–145)

## 2021-03-07 LAB — CBC
HCT: 42.3 % (ref 36.0–46.0)
Hemoglobin: 13.1 g/dL (ref 12.0–15.0)
MCH: 27.2 pg (ref 26.0–34.0)
MCHC: 31 g/dL (ref 30.0–36.0)
MCV: 87.8 fL (ref 80.0–100.0)
Platelets: 205 10*3/uL (ref 150–400)
RBC: 4.82 MIL/uL (ref 3.87–5.11)
RDW: 16.9 % — ABNORMAL HIGH (ref 11.5–15.5)
WBC: 6.5 10*3/uL (ref 4.0–10.5)
nRBC: 0 % (ref 0.0–0.2)

## 2021-03-07 LAB — URINE CULTURE: Culture: >=100000 — AB

## 2021-03-07 MED ORDER — CEFAZOLIN SODIUM-DEXTROSE 1-4 GM/50ML-% IV SOLN
1.0000 g | Freq: Three times a day (TID) | INTRAVENOUS | Status: DC
  Administered 2021-03-07 – 2021-03-09 (×9): 1 g via INTRAVENOUS
  Filled 2021-03-07 (×13): qty 50

## 2021-03-07 NOTE — Care Management Important Message (Signed)
Important Message  Patient Details  Name: [name] MRN: [id number] Date of Birth: July 04, 1946   Medicare Important Message Given:  Yes     Dannette Barbara 03/07/2021, 11:22 AM

## 2021-03-07 NOTE — Progress Notes (Signed)
PROGRESS NOTE    ERRYN DICKISON  JQB:[id number] DOB: May 11, 1946 DOA: 03/04/2021 PCP: System, Provider Not In   Assessment & Plan:   Principal Problem:   Acute metabolic encephalopathy Active Problems:   Acute on chronic diastolic CHF (congestive heart failure) (HCC)   Acute respiratory failure with hypoxia (HCC)   UTI (urinary tract infection)   Stage 3a chronic kidney disease (HCC)   Obesity, Class III, BMI 40-49.9 (morbid obesity) (HCC)   Elevated lipase   COPD (chronic obstructive pulmonary disease) (HCC)   Acute metabolic encephalopathy: likely secondary to UTI , takes multiple sedating medications vs possible delirium vs dementia. Urine cx is growing e. coli. Changed abxs to IV cefazolin. CT head with no acute findings. Close to baseline   UTI: UA is positive. Urine cx is growing, e. coli. Changed abxs to IV cefazolin   Chronic diastolic CHF: as per cardio. Continue on coreg and home dose of torsemide as per cardio. Monitor I/Os and daily weights    Elevated lipase: at 135. CT abdomen and pelvis shows post cholecystectomy changes   CKDIIIa:  Cr continues to trend down.   Morbid obesity: BMI 36.2. Complicates overall care & prognosis   COPD: w/o exacerbation. Continue on bronchodilators  RLS: continue on home dose ropinirole   HLD: continue on statin   Hypothyroidism: continue on home dose of levothyroxine   Depression: severity unknown. Continue on home dose of duloxetine   DVT prophylaxis: lovenox  Code Status:  full Family Communication: discussed pt's care w/ pt's family at bedside and answered their questions  Disposition Plan: SNF   Level of care: Progressive Cardiac  Status is: Inpatient  Remains inpatient appropriate because:IV treatments appropriate due to intensity of illness or inability to take PO and Inpatient level of care appropriate due to severity of illness, waiting on SNF placement   Dispo: The patient is from: SNF              Anticipated  d/c is to: SNF              Patient currently is medically stable for d/c    Difficult to place patient : unclear       Consultants:  Cardio   Procedures:  Antimicrobials: cefazolin    Subjective: Pt c/o malaise  Objective: Vitals:   03/06/21 1659 03/06/21 1909 03/07/21 0407 03/07/21 0744  BP: 139/61 (!) 121/56 (!) 155/74 137/61  Pulse: 62 61 86 87  Resp:  18 18   Temp: 98.8 F (37.1 C) 98.3 F (36.8 C) 99.1 F (37.3 C) 99.5 F (37.5 C)  TempSrc: Oral Oral Oral Oral  SpO2: 90% 95% 94% 93%  Weight:   101.8 kg   Height:        Intake/Output Summary (Last 24 hours) at 03/07/2021 0749 Last data filed at 03/07/2021 0430 Gross per 24 hour  Intake 1060 ml  Output 1000 ml  Net 60 ml   Filed Weights   03/05/21 1750 03/06/21 0500 03/07/21 0407  Weight: 104 kg 103.1 kg 101.8 kg    Examination:  General exam: Appears calm and comfortable   Respiratory system: diminished breath sounds b/l otherwise clear  Cardiovascular system: S1/S2+. No rubs or clicks  Gastrointestinal system: Abd is soft, NT, ND & normal bowel sounds  Central nervous system: Alert and oriented. Moves all extremities  Psychiatry: judgement and insight appear normal. Flat mood and affect    Data Reviewed: I have personally reviewed following labs and imaging studies  CBC: Recent Labs  Lab 03/04/21 2130 03/05/21 0058 03/06/21 0513 03/07/21 0452  WBC 8.7 7.8 7.3 6.5  HGB 12.9 11.8* 12.8 13.1  HCT 41.1 37.6 41.3 42.3  MCV 88.4 90.2 89.2 87.8  PLT 221 224 221 761   Basic Metabolic Panel: Recent Labs  Lab 03/04/21 2130 03/05/21 0058 03/06/21 0513 03/07/21 0452  NA 136  --  136 135  K 4.6  --  4.0 3.7  CL 94*  --  93* 91*  CO2 29  --  33* 35*  GLUCOSE 98  --  88 99  BUN 34*  --  28* 26*  CREATININE 1.53* 1.60* 1.14* 1.09*  CALCIUM 10.0  --  9.9 9.8   GFR: Estimated Creatinine Clearance: 52.9 mL/min (A) (by C-G formula based on SCr of 1.09 mg/dL (H)). Liver Function  Tests: Recent Labs  Lab 03/04/21 2130  AST 25  ALT 16  ALKPHOS 116  BILITOT 1.2  PROT 7.6  ALBUMIN 3.9   Recent Labs  Lab 03/04/21 2130  LIPASE 135*   No results for input(s): AMMONIA in the last 168 hours. Coagulation Profile: No results for input(s): INR, PROTIME in the last 168 hours. Cardiac Enzymes: No results for input(s): CKTOTAL, CKMB, CKMBINDEX, TROPONINI in the last 168 hours. BNP (last 3 results) No results for input(s): PROBNP in the last 8760 hours. HbA1C: No results for input(s): HGBA1C in the last 72 hours. CBG: No results for input(s): GLUCAP in the last 168 hours. Lipid Profile: No results for input(s): CHOL, HDL, LDLCALC, TRIG, CHOLHDL, LDLDIRECT in the last 72 hours. Thyroid Function Tests: No results for input(s): TSH, T4TOTAL, FREET4, T3FREE, THYROIDAB in the last 72 hours. Anemia Panel: No results for input(s): VITAMINB12, FOLATE, FERRITIN, TIBC, IRON, RETICCTPCT in the last 72 hours. Sepsis Labs: Recent Labs  Lab 03/04/21 2130  LATICACIDVEN 1.1    Recent Results (from the past 240 hour(s))  Resp Panel by RT-PCR (Flu A&B, Covid) Nasopharyngeal Swab     Status: None   Collection Time: 03/04/21  9:33 PM   Specimen: Nasopharyngeal Swab; Nasopharyngeal(NP) swabs in vial transport medium  Result Value Ref Range Status   SARS Coronavirus 2 by RT PCR NEGATIVE NEGATIVE Final    Comment: (NOTE) SARS-CoV-2 target nucleic acids are NOT DETECTED.  The SARS-CoV-2 RNA is generally detectable in upper respiratory specimens during the acute phase of infection. The lowest concentration of SARS-CoV-2 viral copies this assay can detect is 138 copies/mL. A negative result does not preclude SARS-Cov-2 infection and should not be used as the sole basis for treatment or other patient management decisions. A negative result may occur with  improper specimen collection/handling, submission of specimen other than nasopharyngeal swab, presence of viral mutation(s)  within the areas targeted by this assay, and inadequate number of viral copies(<138 copies/mL). A negative result must be combined with clinical observations, patient history, and epidemiological information. The expected result is Negative.  Fact Sheet for Patients:  EntrepreneurPulse.com.au  Fact Sheet for Healthcare Providers:  IncredibleEmployment.be  This test is no t yet approved or cleared by the [place] FDA and  has been authorized for detection and/or diagnosis of SARS-CoV-2 by FDA under an Emergency Use Authorization (EUA). This EUA will remain  in effect (meaning this test can be used) for the duration of the COVID-19 declaration under Section 564(b)(1) of the Act, 21 U.S.C.section 360bbb-3(b)(1), unless the authorization is terminated  or revoked sooner.       Influenza A by PCR NEGATIVE  NEGATIVE Final   Influenza B by PCR NEGATIVE NEGATIVE Final    Comment: (NOTE) The Xpert Xpress SARS-CoV-2/FLU/RSV plus assay is intended as an aid in the diagnosis of influenza from Nasopharyngeal swab specimens and should not be used as a sole basis for treatment. Nasal washings and aspirates are unacceptable for Xpert Xpress SARS-CoV-2/FLU/RSV testing.  Fact Sheet for Patients: EntrepreneurPulse.com.au  Fact Sheet for Healthcare Providers: IncredibleEmployment.be  This test is not yet approved or cleared by the [place] FDA and has been authorized for detection and/or diagnosis of SARS-CoV-2 by FDA under an Emergency Use Authorization (EUA). This EUA will remain in effect (meaning this test can be used) for the duration of the COVID-19 declaration under Section 564(b)(1) of the Act, 21 U.S.C. section 360bbb-3(b)(1), unless the authorization is terminated or revoked.  Performed at [facility], [address] [place], [place] [address]   Urine Culture     Status: Abnormal    Collection Time: 03/04/21  9:33 PM   Specimen: Urine, Random  Result Value Ref Range Status   Specimen Description   Final    URINE, RANDOM Performed at [facility], [address] [place], [place] [address]    Special Requests   Final    NONE Performed at [facility], [address] [place],  [address]    Culture >=100,000 COLONIES/mL ESCHERICHIA COLI (A)  Final   Report Status 03/07/2021 FINAL  Final   Organism ID, Bacteria ESCHERICHIA COLI (A)  Final      Susceptibility   Escherichia coli - MIC*    AMPICILLIN >=32 RESISTANT Resistant     CEFAZOLIN <=4 SENSITIVE Sensitive     CEFEPIME <=0.12 SENSITIVE Sensitive     CEFTRIAXONE <=0.25 SENSITIVE Sensitive     CIPROFLOXACIN >=4 RESISTANT Resistant     GENTAMICIN <=1 SENSITIVE Sensitive     IMIPENEM <=0.25 SENSITIVE Sensitive     NITROFURANTOIN <=16 SENSITIVE Sensitive     TRIMETH/SULFA >=320 RESISTANT Resistant     AMPICILLIN/SULBACTAM 4 SENSITIVE Sensitive     PIP/TAZO <=4 SENSITIVE Sensitive     * >=100,000 COLONIES/mL ESCHERICHIA COLI         Radiology Studies: ECHOCARDIOGRAM COMPLETE  Result Date: 03/05/2021    ECHOCARDIOGRAM REPORT   Patient Name:   BELLAGRACE SYLVAN Strange Date of Exam: 03/05/2021 Medical Rec #:  [id number]        Height:       66.0 in Accession #:    [id number]       Weight:       275.6 lb Date of Birth:  10/05/1945         BSA:          2.292 m Patient Age:    75 years         BP:           144/61 mmHg Patient Gender: F                HR:           63 bpm. Exam Location:  [facility] Procedure: 2D Echo, Color Doppler, Cardiac Doppler and Intracardiac            Opacification Agent Indications:     I50.31 CHF-Acute Diastolic  History:         Patient has prior history of Echocardiogram examinations. CKD                  and TIA, Signs/Symptoms:Dementia; Risk  Factors:Hypertension,                  Dyslipidemia, Sleep Apnea and Diabetes.  Sonographer:     Charmayne Sheer RDCS (AE) Referring Phys:   [id number] Athena Masse Diagnosing Phys: Kate Sable MD  Sonographer Comments: Suboptimal apical window and no subcostal window. Global longitudinal strain was attempted. IMPRESSIONS  1. Left ventricular ejection fraction, by estimation, is 60 to 65%. The left ventricle has normal function. The left ventricle has no regional wall motion abnormalities. There is mild left ventricular hypertrophy. Left ventricular diastolic parameters are consistent with Grade I diastolic dysfunction (impaired relaxation).  2. Right ventricular systolic function is normal. The right ventricular size is normal.  3. The mitral valve is normal in structure. No evidence of mitral valve regurgitation.  4. The aortic valve is tricuspid. Aortic valve regurgitation is not visualized. Mild aortic valve sclerosis is present, with no evidence of aortic valve stenosis.  5. The inferior vena cava is normal in size with greater than 50% respiratory variability, suggesting right atrial pressure of 3 mmHg. FINDINGS  Left Ventricle: Left ventricular ejection fraction, by estimation, is 60 to 65%. The left ventricle has normal function. The left ventricle has no regional wall motion abnormalities. The left ventricular internal cavity size was normal in size. There is  mild left ventricular hypertrophy. Left ventricular diastolic parameters are consistent with Grade I diastolic dysfunction (impaired relaxation). Right Ventricle: The right ventricular size is normal. No increase in right ventricular wall thickness. Right ventricular systolic function is normal. Left Atrium: Left atrial size was normal in size. Right Atrium: Right atrial size was normal in size. Pericardium: There is no evidence of pericardial effusion. Mitral Valve: The mitral valve is normal in structure. No evidence of mitral valve regurgitation. MV peak gradient, 3.3 mmHg. The mean mitral valve gradient is 1.0 mmHg. Tricuspid Valve: The tricuspid valve is normal in structure.  Tricuspid valve regurgitation is not demonstrated. Aortic Valve: The aortic valve is tricuspid. Aortic valve regurgitation is not visualized. Mild aortic valve sclerosis is present, with no evidence of aortic valve stenosis. Aortic valve mean gradient measures 4.0 mmHg. Aortic valve peak gradient measures 8.8 mmHg. Aortic valve area, by VTI measures 2.71 cm. Pulmonic Valve: The pulmonic valve was normal in structure. Pulmonic valve regurgitation is not visualized. Aorta: The aortic root is normal in size and structure. Venous: The inferior vena cava is normal in size with greater than 50% respiratory variability, suggesting right atrial pressure of 3 mmHg. IAS/Shunts: No atrial level shunt detected by color flow Doppler.  LEFT VENTRICLE PLAX 2D LVIDd:         4.70 cm  Diastology LVIDs:         2.70 cm  LV e' medial:    7.72 cm/s LV PW:         1.00 cm  LV E/e' medial:  7.0 LV IVS:        1.20 cm  LV e' lateral:   8.49 cm/s LVOT diam:     2.10 cm  LV E/e' lateral: 6.4 LV SV:         73 LV SV Index:   32 LVOT Area:     3.46 cm  RIGHT VENTRICLE RV Basal diam:  3.90 cm LEFT ATRIUM           Index       RIGHT ATRIUM           Index LA diam:  3.50 cm 1.53 cm/m  RA Area:     10.70 cm LA Vol (A4C): 42.9 ml 18.72 ml/m RA Volume:   21.60 ml  9.43 ml/m  AORTIC VALVE                   PULMONIC VALVE AV Area (Vmax):    2.39 cm    PV Vmax:       1.31 m/s AV Area (Vmean):   2.79 cm    PV Vmean:      95.200 cm/s AV Area (VTI):     2.71 cm    PV VTI:        0.276 m AV Vmax:           148.00 cm/s PV Peak grad:  6.9 mmHg AV Vmean:          95.100 cm/s PV Mean grad:  4.0 mmHg AV VTI:            0.271 m AV Peak Grad:      8.8 mmHg AV Mean Grad:      4.0 mmHg LVOT Vmax:         102.00 cm/s LVOT Vmean:        76.600 cm/s LVOT VTI:          0.212 m LVOT/AV VTI ratio: 0.78  AORTA Ao Root diam: 3.20 cm MITRAL VALVE MV Area (PHT): 2.58 cm    SHUNTS MV Area VTI:   3.30 cm    Systemic VTI:  0.21 m MV Peak grad:  3.3 mmHg     Systemic Diam: 2.10 cm MV Mean grad:  1.0 mmHg MV Vmax:       0.91 m/s MV Vmean:      43.5 cm/s MV Decel Time: 294 msec MV E velocity: 54.40 cm/s MV A velocity: 92.10 cm/s MV E/A ratio:  0.59 Kate Sable MD Electronically signed by Kate Sable MD Signature Date/Time: 03/05/2021/1:19:45 PM    Final         Scheduled Meds:  aspirin EC  81 mg Oral Daily   atorvastatin  40 mg Oral QHS   carvedilol  6.25 mg Oral BID WC   clopidogrel  75 mg Oral Daily   docusate sodium  200 mg Oral BID   donepezil  10 mg Oral QHS   DULoxetine  120 mg Oral Daily   enoxaparin (LOVENOX) injection  0.5 mg/kg Subcutaneous Q24H   gabapentin  800 mg Oral TID   lamoTRIgine  100 mg Oral BID   levothyroxine  88 mcg Oral Q0600   polyethylene glycol  17 g Oral Daily   rOPINIRole  3 mg Oral TID   torsemide  80 mg Oral Daily   Continuous Infusions:   ceFAZolin (ANCEF) IV       LOS: 2 days    Time spent: 33 mins     Wyvonnia Dusky, MD [facility] Pager [phone]  If 7PM-7AM, please contact night-coverage 03/07/2021, 7:49 AM

## 2021-03-07 NOTE — Progress Notes (Signed)
Physical Therapy Treatment Patient Details Name: [name] MRN: [id number] DOB: 01-10-1946 Today's Date: 03/07/2021    History of Present Illness [name] is a 36yoF who comes to [facility] from [facility] evening of 7/12 after noted worsening of mentation. Pt admitted with acute metabolic encephalopathy 2/2 UTI. PMH: Cervical spondylosis [name] myelopathy, Lumbar spondylosis, , RA, syncope, PONV, DM2, dCHF, CKD3a, hypoTSH, COPD on O2 at night, remote bilat TKA, remote Left knee poly exchange, recent Left knee prosthesis instability pending cardiology clearance for revision, bilat reverse TSH, bilat shoulder fractures, Lumbar fusion, cholecysectomy.    PT Comments    Pt in bed upon entry, DTR at bedside. Pt assisted with bed level exercises, then adjusted to tall sitting posture in bed at EOS. Left quads remain weak due to on-going hardware failure and patella dislocation, however, she demonstrates fairly good strength otherwise. Pt has requires continuous supplemental O2 all day per DTR, whereas she wears O2 at night only at baseline. Will continue to follow. Educated DTR on promoting regular tall sitting recliner posture in bed throughout the day.    Follow Up Recommendations  SNF;Supervision for mobility/OOB     Equipment Recommendations  None recommended by PT    Recommendations for Other Services       Precautions / Restrictions Precautions Precautions: Fall Restrictions Weight Bearing Restrictions: No Other Position/Activity Restrictions: chronic left knee patella dislocation    Mobility  Bed Mobility Overal bed mobility:  (non performed this date)                  Transfers                    Ambulation/Gait                 Stairs             Wheelchair Mobility    Modified Rankin (Stroke Patients Only)       Balance                                            Cognition Arousal/Alertness:   (drowsy) Behavior During Therapy: WFL for tasks assessed/performed Overall Cognitive Status: Within Functional Limits for tasks assessed                                        Exercises Total Joint Exercises Towel Squeeze: Strengthening;Both;15 reps General Exercises - Lower Extremity Ankle Circles/Pumps: AROM;Both;15 reps Short Arc Quad: AROM;AAROM;Both;15 reps;Limitations Short Arc Quad Limitations: very minimal force production on left due to chronic Left patella dislocation (able to visualize caudal shift in patella in contraction) Heel Slides: AROM;15 reps;Both Hip ABduction/ADduction: AAROM;Both;15 reps;AROM Straight Leg Raises: AAROM;Right;15 reps Mini-Sqauts: Strengthening;Both;15 reps;Limitations Mini Squats Limitations: manually resisted composite hip/knee extension bilat    General Comments        Pertinent Vitals/Pain Pain Assessment:  (typical chronic 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 all day)    Home Living                      Prior Function            PT Goals (current goals can now be found in the care plan section) Acute Rehab PT Goals Patient Stated Goal:  be able to have TKA revision post DC and resume walking PT Goal Formulation: With patient Time For Goal Achievement: 03/20/21 Potential to Achieve Goals: Good Progress towards PT goals: Progressing toward goals    Frequency    Min 2X/week      PT Plan Current plan remains appropriate    Co-evaluation              AM-PAC PT "6 Clicks" Mobility   Outcome Measure  Help needed turning from your back to your side while in a flat bed without using bedrails?: Total Help needed moving from lying on your back to sitting on the side of a flat bed without using bedrails?: Total Help needed moving to and from a bed to a chair (including a wheelchair)?: Total Help needed standing up from a chair using your arms (e.g., wheelchair or bedside chair)?: Total Help needed to  walk in hospital room?: Total Help needed climbing 3-5 steps with a railing? : Total 6 Click Score: 6    End of Session Equipment Utilized During Treatment: Oxygen Activity Tolerance: Patient tolerated treatment well Patient left: in bed;with family/visitor present;with call bell/phone within reach (HOB ~ 55 degrees, close to tall sitting) Nurse Communication: Mobility status PT Visit Diagnosis: Muscle weakness (generalized) (M62.81)     Time: 6219-4712 PT Time Calculation (min) (ACUTE ONLY): 25 min  Charges:  $Therapeutic Exercise: 23-37 mins                    4:56 PM, 03/07/21 Etta Grandchild, PT, DPT Physical Therapist - [facility]  [phone] ([place])    [name] 03/07/2021, 4:54 PM

## 2021-03-08 LAB — CBC
HCT: 39 % (ref 36.0–46.0)
Hemoglobin: 12.2 g/dL (ref 12.0–15.0)
MCH: 27.8 pg (ref 26.0–34.0)
MCHC: 31.3 g/dL (ref 30.0–36.0)
MCV: 88.8 fL (ref 80.0–100.0)
Platelets: 182 10*3/uL (ref 150–400)
RBC: 4.39 MIL/uL (ref 3.87–5.11)
RDW: 16.7 % — ABNORMAL HIGH (ref 11.5–15.5)
WBC: 5.5 10*3/uL (ref 4.0–10.5)
nRBC: 0 % (ref 0.0–0.2)

## 2021-03-08 LAB — BASIC METABOLIC PANEL
Anion gap: 10 (ref 5–15)
BUN: 24 mg/dL — ABNORMAL HIGH (ref 8–23)
CO2: 34 mmol/L — ABNORMAL HIGH (ref 22–32)
Calcium: 9.8 mg/dL (ref 8.9–10.3)
Chloride: 91 mmol/L — ABNORMAL LOW (ref 98–111)
Creatinine, Ser: 1.18 mg/dL — ABNORMAL HIGH (ref 0.44–1.00)
GFR, Estimated: 48 mL/min — ABNORMAL LOW (ref >60)
Glucose, Bld: 97 mg/dL (ref 70–99)
Potassium: 3.4 mmol/L — ABNORMAL LOW (ref 3.5–5.1)
Sodium: 135 mmol/L (ref 135–145)

## 2021-03-08 MED ORDER — GABAPENTIN 300 MG PO CAPS
300.0000 mg | ORAL_CAPSULE | Freq: Three times a day (TID) | ORAL | Status: DC
  Administered 2021-03-08 – 2021-03-11 (×9): 300 mg via ORAL
  Filled 2021-03-08 (×9): qty 1

## 2021-03-08 MED ORDER — POTASSIUM CHLORIDE CRYS ER 20 MEQ PO TBCR
20.0000 meq | EXTENDED_RELEASE_TABLET | Freq: Once | ORAL | Status: AC
  Administered 2021-03-08: 20 meq via ORAL
  Filled 2021-03-08: qty 1

## 2021-03-08 MED ORDER — ENOXAPARIN SODIUM 60 MG/0.6ML IJ SOSY
0.5000 mg/kg | PREFILLED_SYRINGE | INTRAMUSCULAR | Status: DC
  Administered 2021-03-09 – 2021-03-11 (×3): 50 mg via SUBCUTANEOUS
  Filled 2021-03-08 (×4): qty 0.5

## 2021-03-08 NOTE — Progress Notes (Signed)
PROGRESS NOTE    [name]  YQM:[id number] DOB: 1945/10/10 DOA: 03/04/2021 PCP: System, Provider Not In   Assessment & Plan:   Principal Problem:   Acute metabolic encephalopathy Active Problems:   Acute on chronic diastolic CHF (congestive heart failure) (HCC)   Acute respiratory failure with hypoxia (HCC)   UTI (urinary tract infection)   Stage 3a chronic kidney disease (HCC)   Obesity, Class III, BMI 40-49.9 (morbid obesity) (HCC)   Elevated lipase   COPD (chronic obstructive pulmonary disease) (HCC)   Acute metabolic encephalopathy: likely secondary to UTI , takes multiple sedating medications vs possible delirium vs dementia. Urine cx is growing e. coli. Continue on IV cefazolin. CT head with no acute findings. Close to baseline   UTI: urine cx is growing e. coli. Continue on IV cefazolin   Chronic diastolic CHF: as per cardio. Continue on coreg and home dose of torsemide as per cardio. Monitor I/Os and daily weights   Hypokalemia: KCl repleated    Elevated lipase: at 135. CT abdomen and pelvis shows post cholecystectomy changes   CKDIIIa: Cr is labile. Will continue to monitor   Morbid obesity: BMI 36.2. Complicates overall care & prognosis    COPD: w/o exacerbation. Continue on bronchodilators  RLS: continue on home dose ropinirole   HLD: continue on statin    Hypothyroidism: continue on home dose of levothyroxine   Depression: severity unknown. Continue on home dose of duloxetine   DVT prophylaxis: lovenox  Code Status:  full Family Communication: discussed pt's care w/ pt's family at bedside and answered their questions  Disposition Plan: SNF   Level of care: Progressive Cardiac  Status is: Inpatient  Remains inpatient appropriate because:IV treatments appropriate due to intensity of illness or inability to take PO and Inpatient level of care appropriate due to severity of illness, still waiting on SNF placement   Dispo: The patient is from: SNF               Anticipated d/c is to: SNF              Patient currently is medically stable for d/c    Difficult to place patient : unclear   Consultants:  Cardio   Procedures:  Antimicrobials: cefazolin    Subjective: Pt c/o fatigue   Objective: Vitals:   03/07/21 0744 03/07/21 1147 03/07/21 1940 03/08/21 0357  BP: 137/61 (!) 111/45 (!) 115/48 (!) 143/53  Pulse: 87 63 (!) 57 72  Resp:   20 20  Temp: 99.5 F (37.5 C) 99 F (37.2 C) 98.4 F (36.9 C) 98 F (36.7 C)  TempSrc: Oral Oral Oral Oral  SpO2: 93% 97% 98% 98%  Weight:    102.2 kg  Height:        Intake/Output Summary (Last 24 hours) at 03/08/2021 0750 Last data filed at 03/08/2021 0357 Gross per 24 hour  Intake 580.06 ml  Output 600 ml  Net -19.94 ml   Filed Weights   03/06/21 0500 03/07/21 0407 03/08/21 0357  Weight: 103.1 kg 101.8 kg 102.2 kg    Examination:  General exam: Appears comfortable    Respiratory system: decreased breath sounds b/l.  Cardiovascular system: S1 & S2+. No rubs or clicks   Gastrointestinal system: Abd is soft, NT, ND & hypoactive bowel sounds Central nervous system: Alert and oriented. Moves all extremities   Psychiatry: judgement and insight appear normal. Flat mood and affect    Data Reviewed: I have personally reviewed following labs  and imaging studies  CBC: Recent Labs  Lab 03/04/21 2130 03/05/21 0058 03/06/21 0513 03/07/21 0452 03/08/21 0550  WBC 8.7 7.8 7.3 6.5 5.5  HGB 12.9 11.8* 12.8 13.1 12.2  HCT 41.1 37.6 41.3 42.3 39.0  MCV 88.4 90.2 89.2 87.8 88.8  PLT 221 224 221 205 481   Basic Metabolic Panel: Recent Labs  Lab 03/04/21 2130 03/05/21 0058 03/06/21 0513 03/07/21 0452 03/08/21 0550  NA 136  --  136 135 135  K 4.6  --  4.0 3.7 3.4*  CL 94*  --  93* 91* 91*  CO2 29  --  33* 35* 34*  GLUCOSE 98  --  88 99 97  BUN 34*  --  28* 26* 24*  CREATININE 1.53* 1.60* 1.14* 1.09* 1.18*  CALCIUM 10.0  --  9.9 9.8 9.8   GFR: Estimated Creatinine  Clearance: 49 mL/min (A) (by C-G formula based on SCr of 1.18 mg/dL (H)). Liver Function Tests: Recent Labs  Lab 03/04/21 2130  AST 25  ALT 16  ALKPHOS 116  BILITOT 1.2  PROT 7.6  ALBUMIN 3.9   Recent Labs  Lab 03/04/21 2130  LIPASE 135*   No results for input(s): AMMONIA in the last 168 hours. Coagulation Profile: No results for input(s): INR, PROTIME in the last 168 hours. Cardiac Enzymes: No results for input(s): CKTOTAL, CKMB, CKMBINDEX, TROPONINI in the last 168 hours. BNP (last 3 results) No results for input(s): PROBNP in the last 8760 hours. HbA1C: No results for input(s): HGBA1C in the last 72 hours. CBG: No results for input(s): GLUCAP in the last 168 hours. Lipid Profile: No results for input(s): CHOL, HDL, LDLCALC, TRIG, CHOLHDL, LDLDIRECT in the last 72 hours. Thyroid Function Tests: No results for input(s): TSH, T4TOTAL, FREET4, T3FREE, THYROIDAB in the last 72 hours. Anemia Panel: No results for input(s): VITAMINB12, FOLATE, FERRITIN, TIBC, IRON, RETICCTPCT in the last 72 hours. Sepsis Labs: Recent Labs  Lab 03/04/21 2130  LATICACIDVEN 1.1    Recent Results (from the past 240 hour(s))  Resp Panel by RT-PCR (Flu A&B, Covid) Nasopharyngeal Swab     Status: None   Collection Time: 03/04/21  9:33 PM   Specimen: Nasopharyngeal Swab; Nasopharyngeal(NP) swabs in vial transport medium  Result Value Ref Range Status   SARS Coronavirus 2 by RT PCR NEGATIVE NEGATIVE Final    Comment: (NOTE) SARS-CoV-2 target nucleic acids are NOT DETECTED.  The SARS-CoV-2 RNA is generally detectable in upper respiratory specimens during the acute phase of infection. The lowest concentration of SARS-CoV-2 viral copies this assay can detect is 138 copies/mL. A negative result does not preclude SARS-Cov-2 infection and should not be used as the sole basis for treatment or other patient management decisions. A negative result may occur with  improper specimen  collection/handling, submission of specimen other than nasopharyngeal swab, presence of viral mutation(s) within the areas targeted by this assay, and inadequate number of viral copies(<138 copies/mL). A negative result must be combined with clinical observations, patient history, and epidemiological information. The expected result is Negative.  Fact Sheet for Patients:  EntrepreneurPulse.com.au  Fact Sheet for Healthcare Providers:  IncredibleEmployment.be  This test is no t yet approved or cleared by the [place] FDA and  has been authorized for detection and/or diagnosis of SARS-CoV-2 by FDA under an Emergency Use Authorization (EUA). This EUA will remain  in effect (meaning this test can be used) for the duration of the COVID-19 declaration under Section 564(b)(1) of the Act, 21 U.S.C.section  360bbb-3(b)(1), unless the authorization is terminated  or revoked sooner.       Influenza A by PCR NEGATIVE NEGATIVE Final   Influenza B by PCR NEGATIVE NEGATIVE Final    Comment: (NOTE) The Xpert Xpress SARS-CoV-2/FLU/RSV plus assay is intended as an aid in the diagnosis of influenza from Nasopharyngeal swab specimens and should not be used as a sole basis for treatment. Nasal washings and aspirates are unacceptable for Xpert Xpress SARS-CoV-2/FLU/RSV testing.  Fact Sheet for Patients: EntrepreneurPulse.com.au  Fact Sheet for Healthcare Providers: IncredibleEmployment.be  This test is not yet approved or cleared by the [place] FDA and has been authorized for detection and/or diagnosis of SARS-CoV-2 by FDA under an Emergency Use Authorization (EUA). This EUA will remain in effect (meaning this test can be used) for the duration of the COVID-19 declaration under Section 564(b)(1) of the Act, 21 U.S.C. section 360bbb-3(b)(1), unless the authorization is terminated or revoked.  Performed at [facility], [address] [place], [place] [address]   Urine Culture     Status: Abnormal   Collection Time: 03/04/21  9:33 PM   Specimen: Urine, Random  Result Value Ref Range Status   Specimen Description   Final    URINE, RANDOM Performed at [facility], [address] [place], [place] [address]    Special Requests   Final    NONE Performed at [facility], [place], [place] [address]    Culture >=100,000 COLONIES/mL ESCHERICHIA COLI (A)  Final   Report Status 03/07/2021 FINAL  Final   Organism ID, Bacteria ESCHERICHIA COLI (A)  Final      Susceptibility   Escherichia coli - MIC*    AMPICILLIN >=32 RESISTANT Resistant     CEFAZOLIN <=4 SENSITIVE Sensitive     CEFEPIME <=0.12 SENSITIVE Sensitive     CEFTRIAXONE <=0.25 SENSITIVE Sensitive     CIPROFLOXACIN >=4 RESISTANT Resistant     GENTAMICIN <=1 SENSITIVE Sensitive     IMIPENEM <=0.25 SENSITIVE Sensitive     NITROFURANTOIN <=16 SENSITIVE Sensitive     TRIMETH/SULFA >=320 RESISTANT Resistant     AMPICILLIN/SULBACTAM 4 SENSITIVE Sensitive     PIP/TAZO <=4 SENSITIVE Sensitive     * >=100,000 COLONIES/mL ESCHERICHIA COLI         Radiology Studies: No results found.      Scheduled Meds:  aspirin EC  81 mg Oral Daily   atorvastatin  40 mg Oral QHS   carvedilol  6.25 mg Oral BID WC   clopidogrel  75 mg Oral Daily   docusate sodium  200 mg Oral BID   donepezil  10 mg Oral QHS   DULoxetine  120 mg Oral Daily   enoxaparin (LOVENOX) injection  0.5 mg/kg Subcutaneous Q24H   gabapentin  800 mg Oral TID   lamoTRIgine  100 mg Oral BID   levothyroxine  88 mcg Oral Q0600   polyethylene glycol  17 g Oral Daily   rOPINIRole  3 mg Oral TID   torsemide  80 mg Oral Daily   Continuous Infusions:   ceFAZolin (ANCEF) IV 1 g (03/08/21 0020)     LOS: 3 days    Time spent: 31 mins     Wyvonnia Dusky, MD [facility] Pager [phone]  If 7PM-7AM, please contact  night-coverage 03/08/2021, 7:50 AM

## 2021-03-09 LAB — CBC
HCT: 38.3 % (ref 36.0–46.0)
Hemoglobin: 12.3 g/dL (ref 12.0–15.0)
MCH: 29 pg (ref 26.0–34.0)
MCHC: 32.1 g/dL (ref 30.0–36.0)
MCV: 90.3 fL (ref 80.0–100.0)
Platelets: 173 10*3/uL (ref 150–400)
RBC: 4.24 MIL/uL (ref 3.87–5.11)
RDW: 16.8 % — ABNORMAL HIGH (ref 11.5–15.5)
WBC: 5.4 10*3/uL (ref 4.0–10.5)
nRBC: 0 % (ref 0.0–0.2)

## 2021-03-09 LAB — BASIC METABOLIC PANEL
Anion gap: 8 (ref 5–15)
BUN: 26 mg/dL — ABNORMAL HIGH (ref 8–23)
CO2: 37 mmol/L — ABNORMAL HIGH (ref 22–32)
Calcium: 9.9 mg/dL (ref 8.9–10.3)
Chloride: 91 mmol/L — ABNORMAL LOW (ref 98–111)
Creatinine, Ser: 1.13 mg/dL — ABNORMAL HIGH (ref 0.44–1.00)
GFR, Estimated: 50 mL/min — ABNORMAL LOW (ref >60)
Glucose, Bld: 100 mg/dL — ABNORMAL HIGH (ref 70–99)
Potassium: 3.8 mmol/L (ref 3.5–5.1)
Sodium: 136 mmol/L (ref 135–145)

## 2021-03-09 MED ORDER — SODIUM CHLORIDE 0.9 % IV SOLN
INTRAVENOUS | Status: DC | PRN
  Administered 2021-03-09 (×2): 500 mL via INTRAVENOUS

## 2021-03-09 MED ORDER — LACTULOSE 10 GM/15ML PO SOLN: 20.0000 g | Freq: Two times a day (BID) | ORAL | Status: DC

## 2021-03-09 NOTE — Progress Notes (Signed)
PROGRESS NOTE    [name]  KYH:[id number] DOB: Jul 08, 1946 DOA: 03/04/2021 PCP: System, Provider Not In   Assessment & Plan:   Principal Problem:   Acute metabolic encephalopathy Active Problems:   Acute on chronic diastolic CHF (congestive heart failure) (HCC)   Acute respiratory failure with hypoxia (HCC)   UTI (urinary tract infection)   Stage 3a chronic kidney disease (HCC)   Obesity, Class III, BMI 40-49.9 (morbid obesity) (HCC)   Elevated lipase   COPD (chronic obstructive pulmonary disease) (HCC)   Acute metabolic encephalopathy: likely secondary to UTI , takes multiple sedating medications vs possible delirium vs dementia. Urine cx is growing e. coli. Continue on IV cefazolin. CT head with no acute findings. Close to baseline   UTI: urine cx is growing e. coli. Continue on IV cefazolin and complete the course after today's doses   Constipation: continue on colace, miralax. Started on lactulose   Chronic diastolic CHF: as per cardio. Continue on coreg and home dose of torsemide as per cardio. Monitor I/Os and daily weights   Hypokalemia: WNL today    Elevated lipase: at 135. CT abdomen and pelvis shows post cholecystectomy changes   CKDIIIa: Cr is labile. Avoid nephrotoxic meds   Morbid obesity: BMI 36.2. Complicates overall care & prognosis    COPD: w/o exacerbation. Continue on bronchodilators and encourage incentive spirometry   RLS: continue on home dose of ropinirole   HLD: continue on statin   Hypothyroidism: continue on home dose of levothyroxine   Depression: severity unknown. Continue on home dose of duloxetine   DVT prophylaxis: lovenox  Code Status:  full Family Communication: discussed pt's care w/ pt's family at bedside and answered their questions  Disposition Plan: SNF   Level of care: Progressive Cardiac  Status is: Inpatient  Remains inpatient appropriate because:IV treatments appropriate due to intensity of illness or inability to  take PO and Inpatient level of care appropriate due to severity of illness, still waiting on SNF placement   Dispo: The patient is from: SNF              Anticipated d/c is to: SNF              Patient currently is medically stable for d/c    Difficult to place patient : unclear   Consultants:  Cardio   Procedures:  Antimicrobials: cefazolin    Subjective: Pt c/o constipation   Objective: Vitals:   03/08/21 1627 03/08/21 1935 03/09/21 0500 03/09/21 0759  BP: (!) 111/51 (!) 120/54  (!) 121/47  Pulse: (!) 57 (!) 59  63  Resp: 18 18  18   Temp: 98 F (36.7 C) 98.6 F (37 C)  98.4 F (36.9 C)  TempSrc:  Oral  Oral  SpO2: 98% 100%  96%  Weight:   102.6 kg   Height:        Intake/Output Summary (Last 24 hours) at 03/09/2021 0811 Last data filed at 03/09/2021 0533 Gross per 24 hour  Intake 270 ml  Output 1600 ml  Net -1330 ml   Filed Weights   03/07/21 0407 03/08/21 0357 03/09/21 0500  Weight: 101.8 kg 102.2 kg 102.6 kg    Examination:  General exam: Appears uncomfortable     Respiratory system: diminished breath sounds b/l  Cardiovascular system: S1/S2+. No rubs or clicks  Gastrointestinal system: Abd is soft, NT, obese & hypoactive bowel sounds  Central nervous system: Alert and oriented. Moves all extremities    Psychiatry: judgement  and insight appear normal. Anxious mood and affect    Data Reviewed: I have personally reviewed following labs and imaging studies  CBC: Recent Labs  Lab 03/05/21 0058 03/06/21 0513 03/07/21 0452 03/08/21 0550 03/09/21 0535  WBC 7.8 7.3 6.5 5.5 5.4  HGB 11.8* 12.8 13.1 12.2 12.3  HCT 37.6 41.3 42.3 39.0 38.3  MCV 90.2 89.2 87.8 88.8 90.3  PLT 224 221 205 182 347   Basic Metabolic Panel: Recent Labs  Lab 03/04/21 2130 03/05/21 0058 03/06/21 0513 03/07/21 0452 03/08/21 0550 03/09/21 0535  NA 136  --  136 135 135 136  K 4.6  --  4.0 3.7 3.4* 3.8  CL 94*  --  93* 91* 91* 91*  CO2 29  --  33* 35* 34* 37*  GLUCOSE  98  --  88 99 97 100*  BUN 34*  --  28* 26* 24* 26*  CREATININE 1.53* 1.60* 1.14* 1.09* 1.18* 1.13*  CALCIUM 10.0  --  9.9 9.8 9.8 9.9   GFR: Estimated Creatinine Clearance: 51.2 mL/min (A) (by C-G formula based on SCr of 1.13 mg/dL (H)). Liver Function Tests: Recent Labs  Lab 03/04/21 2130  AST 25  ALT 16  ALKPHOS 116  BILITOT 1.2  PROT 7.6  ALBUMIN 3.9   Recent Labs  Lab 03/04/21 2130  LIPASE 135*   No results for input(s): AMMONIA in the last 168 hours. Coagulation Profile: No results for input(s): INR, PROTIME in the last 168 hours. Cardiac Enzymes: No results for input(s): CKTOTAL, CKMB, CKMBINDEX, TROPONINI in the last 168 hours. BNP (last 3 results) No results for input(s): PROBNP in the last 8760 hours. HbA1C: No results for input(s): HGBA1C in the last 72 hours. CBG: No results for input(s): GLUCAP in the last 168 hours. Lipid Profile: No results for input(s): CHOL, HDL, LDLCALC, TRIG, CHOLHDL, LDLDIRECT in the last 72 hours. Thyroid Function Tests: No results for input(s): TSH, T4TOTAL, FREET4, T3FREE, THYROIDAB in the last 72 hours. Anemia Panel: No results for input(s): VITAMINB12, FOLATE, FERRITIN, TIBC, IRON, RETICCTPCT in the last 72 hours. Sepsis Labs: Recent Labs  Lab 03/04/21 2130  LATICACIDVEN 1.1    Recent Results (from the past 240 hour(s))  Resp Panel by RT-PCR (Flu A&B, Covid) Nasopharyngeal Swab     Status: None   Collection Time: 03/04/21  9:33 PM   Specimen: Nasopharyngeal Swab; Nasopharyngeal(NP) swabs in vial transport medium  Result Value Ref Range Status   SARS Coronavirus 2 by RT PCR NEGATIVE NEGATIVE Final    Comment: (NOTE) SARS-CoV-2 target nucleic acids are NOT DETECTED.  The SARS-CoV-2 RNA is generally detectable in upper respiratory specimens during the acute phase of infection. The lowest concentration of SARS-CoV-2 viral copies this assay can detect is 138 copies/mL. A negative result does not preclude  SARS-Cov-2 infection and should not be used as the sole basis for treatment or other patient management decisions. A negative result may occur with  improper specimen collection/handling, submission of specimen other than nasopharyngeal swab, presence of viral mutation(s) within the areas targeted by this assay, and inadequate number of viral copies(<138 copies/mL). A negative result must be combined with clinical observations, patient history, and epidemiological information. The expected result is Negative.  Fact Sheet for Patients:  EntrepreneurPulse.com.au  Fact Sheet for Healthcare Providers:  IncredibleEmployment.be  This test is no t yet approved or cleared by the [place] FDA and  has been authorized for detection and/or diagnosis of SARS-CoV-2 by FDA under an Emergency Use Authorization (  EUA). This EUA will remain  in effect (meaning this test can be used) for the duration of the COVID-19 declaration under Section 564(b)(1) of the Act, 21 U.S.C.section 360bbb-3(b)(1), unless the authorization is terminated  or revoked sooner.       Influenza A by PCR NEGATIVE NEGATIVE Final   Influenza B by PCR NEGATIVE NEGATIVE Final    Comment: (NOTE) The Xpert Xpress SARS-CoV-2/FLU/RSV plus assay is intended as an aid in the diagnosis of influenza from Nasopharyngeal swab specimens and should not be used as a sole basis for treatment. Nasal washings and aspirates are unacceptable for Xpert Xpress SARS-CoV-2/FLU/RSV testing.  Fact Sheet for Patients: EntrepreneurPulse.com.au  Fact Sheet for Healthcare Providers: IncredibleEmployment.be  This test is not yet approved or cleared by the [place] FDA and has been authorized for detection and/or diagnosis of SARS-CoV-2 by FDA under an Emergency Use Authorization (EUA). This EUA will remain in effect (meaning this test can be used) for the duration of  the COVID-19 declaration under Section 564(b)(1) of the Act, 21 U.S.C. section 360bbb-3(b)(1), unless the authorization is terminated or revoked.  Performed at [facility], [address] [place], [place] [address]   Urine Culture     Status: Abnormal   Collection Time: 03/04/21  9:33 PM   Specimen: Urine, Random  Result Value Ref Range Status   Specimen Description   Final    URINE, RANDOM Performed at [facility], [address] [place], [place] [address]    Special Requests   Final    NONE Performed at [facility], [place],  [address]    Culture >=100,000 COLONIES/mL ESCHERICHIA COLI (A)  Final   Report Status 03/07/2021 FINAL  Final   Organism ID, Bacteria ESCHERICHIA COLI (A)  Final      Susceptibility   Escherichia coli - MIC*    AMPICILLIN >=32 RESISTANT Resistant     CEFAZOLIN <=4 SENSITIVE Sensitive     CEFEPIME <=0.12 SENSITIVE Sensitive     CEFTRIAXONE <=0.25 SENSITIVE Sensitive     CIPROFLOXACIN >=4 RESISTANT Resistant     GENTAMICIN <=1 SENSITIVE Sensitive     IMIPENEM <=0.25 SENSITIVE Sensitive     NITROFURANTOIN <=16 SENSITIVE Sensitive     TRIMETH/SULFA >=320 RESISTANT Resistant     AMPICILLIN/SULBACTAM 4 SENSITIVE Sensitive     PIP/TAZO <=4 SENSITIVE Sensitive     * >=100,000 COLONIES/mL ESCHERICHIA COLI         Radiology Studies: No results found.      Scheduled Meds:  aspirin EC  81 mg Oral Daily   atorvastatin  40 mg Oral QHS   carvedilol  6.25 mg Oral BID WC   clopidogrel  75 mg Oral Daily   docusate sodium  200 mg Oral BID   donepezil  10 mg Oral QHS   DULoxetine  120 mg Oral Daily   enoxaparin (LOVENOX) injection  0.5 mg/kg Subcutaneous Q24H   gabapentin  300 mg Oral TID   lamoTRIgine  100 mg Oral BID   levothyroxine  88 mcg Oral Q0600   polyethylene glycol  17 g Oral Daily   rOPINIRole  3 mg Oral TID   torsemide  80 mg Oral Daily   Continuous Infusions:   ceFAZolin  (ANCEF) IV 1 g (03/08/21 2326)     LOS: 4 days    Time spent: 31 mins     Wyvonnia Dusky, MD [facility] Pager [phone]  If 7PM-7AM, please contact  night-coverage 03/09/2021, 8:11 AM

## 2021-03-10 ENCOUNTER — Encounter (HOSPITAL_COMMUNITY): Payer: Medicare Other

## 2021-03-10 ENCOUNTER — Encounter: Payer: Medicare Other | Admitting: Occupational Therapy

## 2021-03-10 LAB — BASIC METABOLIC PANEL
Anion gap: 9 (ref 5–15)
BUN: 28 mg/dL — ABNORMAL HIGH (ref 8–23)
CO2: 36 mmol/L — ABNORMAL HIGH (ref 22–32)
Calcium: 9.9 mg/dL (ref 8.9–10.3)
Chloride: 91 mmol/L — ABNORMAL LOW (ref 98–111)
Creatinine, Ser: 1.2 mg/dL — ABNORMAL HIGH (ref 0.44–1.00)
GFR, Estimated: 47 mL/min — ABNORMAL LOW (ref >60)
Glucose, Bld: 97 mg/dL (ref 70–99)
Potassium: 3.6 mmol/L (ref 3.5–5.1)
Sodium: 136 mmol/L (ref 135–145)

## 2021-03-10 LAB — CBC
HCT: 41.3 % (ref 36.0–46.0)
Hemoglobin: 13 g/dL (ref 12.0–15.0)
MCH: 28 pg (ref 26.0–34.0)
MCHC: 31.5 g/dL (ref 30.0–36.0)
MCV: 88.8 fL (ref 80.0–100.0)
Platelets: 189 10*3/uL (ref 150–400)
RBC: 4.65 MIL/uL (ref 3.87–5.11)
RDW: 17 % — ABNORMAL HIGH (ref 11.5–15.5)
WBC: 5.7 10*3/uL (ref 4.0–10.5)
nRBC: 0 % (ref 0.0–0.2)

## 2021-03-10 NOTE — Care Management Important Message (Signed)
Important Message  Patient Details  Name: [name] MRN: [id number] Date of Birth: 09-05-1945   Medicare Important Message Given:  Yes     Dannette Barbara 03/10/2021, 12:48 PM

## 2021-03-10 NOTE — TOC Progression Note (Signed)
Transition of Care [facility]) - Progression Note    Patient Details  Name: [name] MRN: [id number] Date of Birth: 03-14-1946  Transition of Care [facility]) CM/SW [place], [place] Phone Number: 03/10/2021, 10:12 AM  Clinical Narrative:     CSW lvm with Elyse Hsu at [facility] to inquire if bed ready for patient to transition to their facility for LTC, as per daughter Vicente Males they were holding a bed for her.   Pending call back at this time.   Expected Discharge Plan: [place] Barriers to Discharge: Continued Medical Work up  Expected Discharge Plan and Services Expected Discharge Plan: [place] In-house Referral: Clinical Social Work   Post Acute Care Choice: [place] Living arrangements for the past 2 months: [place]                                       Social Determinants of Health (SDOH) Interventions    Readmission Risk Interventions No flowsheet data found.

## 2021-03-10 NOTE — Progress Notes (Signed)
Occupational Therapy Treatment Patient Details Name: INDONESIA MCKEOUGH MRN: [id number] DOB: 06/06/1946 Today's Date: 03/10/2021    History of present illness Kaityln Kallstrom is a 36yoF who comes to [facility] from [facility] evening of 7/12 after noted worsening of mentation. Pt admitted with acute metabolic encephalopathy 2/2 UTI. PMH: Cervical spondylosis c myelopathy, Lumbar spondylosis, , RA, syncope, PONV, DM2, dCHF, CKD3a, hypoTSH, COPD on O2 at night, remote bilat TKA, remote Left knee poly exchange, recent Left knee prosthesis instability pending cardiology clearance for revision, bilat reverse TSH, bilat shoulder fractures, Lumbar fusion, cholecysectomy.   OT comments  Upon entering the room, pt supine in bed with daughter present in the room. Pt is agreeable to OT intervention and requesting assistance to wash her hair. Pt performed bed mobility to EOB with mod A for trunk and L LE. OT assisting pt with wetting hair and shampooing and she dried and combed hair with set up A to obtain needed items. Pt then washing UB with set up A while seated EOB. OT assisted pt with washing lower legs and feet. Pt overall maintaining balance on EOB for ~ 30 minutes for self care tasks. Pt needing cuing for anterior weight shift on occasion but able to perform herself. Pt progressing towards goals and making excellent progress this session. Pt returns to supine with max A for trunk and B LEs. All needs within reach and daughter remains in room.    Follow Up Recommendations  SNF;Supervision/Assistance - 24 hour    Equipment Recommendations  Other (comment) (defer to next venue of care)       Precautions / Restrictions Precautions Precautions: Fall Restrictions Weight Bearing Restrictions: No Other Position/Activity Restrictions: chronic left knee patella dislocation       Mobility Bed Mobility Overal bed mobility: Needs Assistance Bed Mobility: Supine to Sit;Sit to Supine     Supine to sit: Mod  assist Sit to supine: Max assist   General bed mobility comments: Pt needing assist with L LE and trunk for bed mobility tasks. Pt putting forth good effort.            Balance Overall balance assessment: Needs assistance Sitting-balance support: Feet supported Sitting balance-Leahy Scale: Good Sitting balance - Comments: no physical assist for cuing this session with pt seated EOB for 30 minutes with supervision                                   ADL either performed or assessed with clinical judgement   ADL Overall ADL's : Needs assistance/impaired         Upper Body Bathing: Set up;Cueing for safety;Sitting   Lower Body Bathing: Moderate assistance;Sitting/lateral leans   Upper Body Dressing : Minimal assistance;Sitting                           Vision Patient Visual Report: No change from baseline            Cognition Arousal/Alertness: Awake/alert Behavior During Therapy: WFL for tasks assessed/performed Overall Cognitive Status: Within Functional Limits for tasks assessed                                                     Pertinent Vitals/ Pain  Pain Assessment: Faces Faces Pain Scale: No hurt                                                          Frequency  Min 2X/week        Progress Toward Goals  OT Goals(current goals can now be found in the care plan section)  Progress towards OT goals: Progressing toward goals  Acute Rehab OT Goals Patient Stated Goal: to get stronger OT Goal Formulation: With patient/family Time For Goal Achievement: 03/20/21 Potential to Achieve Goals: Good  Plan Discharge plan remains appropriate;Frequency remains appropriate       AM-PAC OT "6 Clicks" Daily Activity     Outcome Measure   Help from another person eating meals?: None Help from another person taking care of personal grooming?: A Little Help from another person  toileting, which includes using toliet, bedpan, or urinal?: Total Help from another person bathing (including washing, rinsing, drying)?: A Lot Help from another person to put on and taking off regular upper body clothing?: A Little Help from another person to put on and taking off regular lower body clothing?: Total 6 Click Score: 14    End of Session    OT Visit Diagnosis: Unsteadiness on feet (R26.81);Repeated falls (R29.6);Muscle weakness (generalized) (M62.81)   Activity Tolerance Patient tolerated treatment well   Patient Left in bed;with call bell/phone within reach;with bed alarm set;with family/visitor present   Nurse Communication Mobility status        Time: 0354-6568 OT Time Calculation (min): 38 min  Charges: OT General Charges $OT Visit: 1 Visit OT Treatments $Self Care/Home Management : 38-52 mins  Darleen Crocker, MS, OTR/L , CBIS ascom [phone]  03/10/21, 11:51 AM

## 2021-03-10 NOTE — Progress Notes (Signed)
PROGRESS NOTE    [name]  MBT:[id number] DOB: [birth year] DOA: 03/04/2021 PCP: System, Provider Not In   Assessment & Plan:   Principal Problem:   Acute metabolic encephalopathy Active Problems:   Acute on chronic diastolic CHF (congestive heart failure) (HCC)   Acute respiratory failure with hypoxia (HCC)   UTI (urinary tract infection)   Stage 3a chronic kidney disease (HCC)   Obesity, Class III, BMI 40-49.9 (morbid obesity) (HCC)   Elevated lipase   COPD (chronic obstructive pulmonary disease) (HCC)   Acute metabolic encephalopathy: likely secondary to UTI , takes multiple sedating medications vs possible delirium vs dementia. Urine cx is growing e. coli. Completed abx course. CT head with no acute findings. Close to baseline   UTI: urine cx growing e. coli. Completed abx course   Constipation: had a bowel movement. Resolved   Chronic diastolic CHF: as per cardio. Continue on coreg and home dose of torsemide as per cardio. Monitor I/Os and daily weights   Hypokalemia: within normal limits today     Elevated lipase: at 135. CT abdomen and pelvis shows post cholecystectomy changes   CKDIIIa: Cr is trending up today. Will continue to monitor    Morbid obesity: BMI 36.2. Complicates overall care and prognosis    COPD: w/o exacerbation. Continue on bronchodilators and encourage incentive spirometry   RLS: continue on home dose of ropinirole   HLD: continue on statin   Hypothyroidism: continue on home dose of levothyroxine   Depression: severity unknown. Continue on home dose of duloxetine    DVT prophylaxis: lovenox  Code Status:  full Family Communication: discussed pt's care w/ pt's family at bedside and answered their questions  Disposition Plan: SNF   Level of care: Progressive Cardiac  Status is: Inpatient  Remains inpatient appropriate because:IV treatments appropriate due to intensity of illness or inability to take PO and Inpatient level of care  appropriate due to severity of illness, still waiting on SNF placement   Dispo: The patient is from: SNF              Anticipated d/c is to: SNF              Patient currently is medically stable for d/c    Difficult to place patient : unclear   Consultants:  Cardio   Procedures:  Antimicrobials:    Subjective: Pt c/o fatigue   Objective: Vitals:   03/09/21 0759 03/09/21 1931 03/10/21 0441 03/10/21 0738  BP: (!) 121/47 (!) 126/46 (!) 119/50 (!) 121/56  Pulse: 63 61 60 (!) 59  Resp: 18  16 18   Temp: 98.4 F (36.9 C) 98 F (36.7 C) 98.2 F (36.8 C) 97.9 F (36.6 C)  TempSrc: Oral Oral Oral   SpO2: 96% 100% 100% 99%  Weight:      Height:        Intake/Output Summary (Last 24 hours) at 03/10/2021 0813 Last data filed at 03/10/2021 0440 Gross per 24 hour  Intake 771.26 ml  Output 1900 ml  Net -1128.74 ml   Filed Weights   03/07/21 0407 03/08/21 0357 03/09/21 0500  Weight: 101.8 kg 102.2 kg 102.6 kg    Examination:  General exam: Appears calm and comfortable  Respiratory system: decreased breath sounds b/l otherwise clear  Cardiovascular system: S1/S2+. No rubs or clicks  Gastrointestinal system: Abd is soft, NT,obese, normal bowel sounds  Central nervous system: Alert and oriented. Moves all extremities  Psychiatry: judgement and insight appear normal. Flat mood  and affect     Data Reviewed: I have personally reviewed following labs and imaging studies  CBC: Recent Labs  Lab 03/06/21 0513 03/07/21 0452 03/08/21 0550 03/09/21 0535 03/10/21 0453  WBC 7.3 6.5 5.5 5.4 5.7  HGB 12.8 13.1 12.2 12.3 13.0  HCT 41.3 42.3 39.0 38.3 41.3  MCV 89.2 87.8 88.8 90.3 88.8  PLT 221 205 182 173 354   Basic Metabolic Panel: Recent Labs  Lab 03/06/21 0513 03/07/21 0452 03/08/21 0550 03/09/21 0535 03/10/21 0453  NA 136 135 135 136 136  K 4.0 3.7 3.4* 3.8 3.6  CL 93* 91* 91* 91* 91*  CO2 33* 35* 34* 37* 36*  GLUCOSE 88 99 97 100* 97  BUN 28* 26* 24* 26* 28*   CREATININE 1.14* 1.09* 1.18* 1.13* 1.20*  CALCIUM 9.9 9.8 9.8 9.9 9.9   GFR: Estimated Creatinine Clearance: 48.2 mL/min (A) (by C-G formula based on SCr of 1.2 mg/dL (H)). Liver Function Tests: Recent Labs  Lab 03/04/21 2130  AST 25  ALT 16  ALKPHOS 116  BILITOT 1.2  PROT 7.6  ALBUMIN 3.9   Recent Labs  Lab 03/04/21 2130  LIPASE 135*   No results for input(s): AMMONIA in the last 168 hours. Coagulation Profile: No results for input(s): INR, PROTIME in the last 168 hours. Cardiac Enzymes: No results for input(s): CKTOTAL, CKMB, CKMBINDEX, TROPONINI in the last 168 hours. BNP (last 3 results) No results for input(s): PROBNP in the last 8760 hours. HbA1C: No results for input(s): HGBA1C in the last 72 hours. CBG: No results for input(s): GLUCAP in the last 168 hours. Lipid Profile: No results for input(s): CHOL, HDL, LDLCALC, TRIG, CHOLHDL, LDLDIRECT in the last 72 hours. Thyroid Function Tests: No results for input(s): TSH, T4TOTAL, FREET4, T3FREE, THYROIDAB in the last 72 hours. Anemia Panel: No results for input(s): VITAMINB12, FOLATE, FERRITIN, TIBC, IRON, RETICCTPCT in the last 72 hours. Sepsis Labs: Recent Labs  Lab 03/04/21 2130  LATICACIDVEN 1.1    Recent Results (from the past 240 hour(s))  Resp Panel by RT-PCR (Flu A&B, Covid) Nasopharyngeal Swab     Status: None   Collection Time: 03/04/21  9:33 PM   Specimen: Nasopharyngeal Swab; Nasopharyngeal(NP) swabs in vial transport medium  Result Value Ref Range Status   SARS Coronavirus 2 by RT PCR NEGATIVE NEGATIVE Final    Comment: (NOTE) SARS-CoV-2 target nucleic acids are NOT DETECTED.  The SARS-CoV-2 RNA is generally detectable in upper respiratory specimens during the acute phase of infection. The lowest concentration of SARS-CoV-2 viral copies this assay can detect is 138 copies/mL. A negative result does not preclude SARS-Cov-2 infection and should not be used as the sole basis for treatment  or other patient management decisions. A negative result may occur with  improper specimen collection/handling, submission of specimen other than nasopharyngeal swab, presence of viral mutation(s) within the areas targeted by this assay, and inadequate number of viral copies(<138 copies/mL). A negative result must be combined with clinical observations, patient history, and epidemiological information. The expected result is Negative.  Fact Sheet for Patients:  EntrepreneurPulse.com.au  Fact Sheet for Healthcare Providers:  IncredibleEmployment.be  This test is no t yet approved or cleared by the [place] FDA and  has been authorized for detection and/or diagnosis of SARS-CoV-2 by FDA under an Emergency Use Authorization (EUA). This EUA will remain  in effect (meaning this test can be used) for the duration of the COVID-19 declaration under Section 564(b)(1) of the Act, 21 U.S.C.section  360bbb-3(b)(1), unless the authorization is terminated  or revoked sooner.       Influenza A by PCR NEGATIVE NEGATIVE Final   Influenza B by PCR NEGATIVE NEGATIVE Final    Comment: (NOTE) The Xpert Xpress SARS-CoV-2/FLU/RSV plus assay is intended as an aid in the diagnosis of influenza from Nasopharyngeal swab specimens and should not be used as a sole basis for treatment. Nasal washings and aspirates are unacceptable for Xpert Xpress SARS-CoV-2/FLU/RSV testing.  Fact Sheet for Patients: EntrepreneurPulse.com.au  Fact Sheet for Healthcare Providers: IncredibleEmployment.be  This test is not yet approved or cleared by the [place] FDA and has been authorized for detection and/or diagnosis of SARS-CoV-2 by FDA under an Emergency Use Authorization (EUA). This EUA will remain in effect (meaning this test can be used) for the duration of the COVID-19 declaration under Section 564(b)(1) of the Act, 21 U.S.C. section  360bbb-3(b)(1), unless the authorization is terminated or revoked.  Performed at [facility], [address] [place], [place] [address]   Urine Culture     Status: Abnormal   Collection Time: 03/04/21  9:33 PM   Specimen: Urine, Random  Result Value Ref Range Status   Specimen Description   Final    URINE, RANDOM Performed at [facility], [address] [place], [place] [address]    Special Requests   Final    NONE Performed at [facility], [place],  [address]    Culture >=100,000 COLONIES/mL ESCHERICHIA COLI (A)  Final   Report Status 03/07/2021 FINAL  Final   Organism ID, Bacteria ESCHERICHIA COLI (A)  Final      Susceptibility   Escherichia coli - MIC*    AMPICILLIN >=32 RESISTANT Resistant     CEFAZOLIN <=4 SENSITIVE Sensitive     CEFEPIME <=0.12 SENSITIVE Sensitive     CEFTRIAXONE <=0.25 SENSITIVE Sensitive     CIPROFLOXACIN >=4 RESISTANT Resistant     GENTAMICIN <=1 SENSITIVE Sensitive     IMIPENEM <=0.25 SENSITIVE Sensitive     NITROFURANTOIN <=16 SENSITIVE Sensitive     TRIMETH/SULFA >=320 RESISTANT Resistant     AMPICILLIN/SULBACTAM 4 SENSITIVE Sensitive     PIP/TAZO <=4 SENSITIVE Sensitive     * >=100,000 COLONIES/mL ESCHERICHIA COLI         Radiology Studies: No results found.      Scheduled Meds:  aspirin EC  81 mg Oral Daily   atorvastatin  40 mg Oral QHS   carvedilol  6.25 mg Oral BID WC   clopidogrel  75 mg Oral Daily   docusate sodium  200 mg Oral BID   donepezil  10 mg Oral QHS   DULoxetine  120 mg Oral Daily   enoxaparin (LOVENOX) injection  0.5 mg/kg Subcutaneous Q24H   gabapentin  300 mg Oral TID   lactulose  20 g Oral BID   lamoTRIgine  100 mg Oral BID   levothyroxine  88 mcg Oral Q0600   polyethylene glycol  17 g Oral Daily   rOPINIRole  3 mg Oral TID   torsemide  80 mg Oral Daily   Continuous Infusions:  sodium chloride 10 mL/hr at 03/09/21 1848     LOS: 5 days     Time spent: 25 mins     Wyvonnia Dusky, MD [facility] Pager [phone]  If 7PM-7AM, please contact night-coverage 03/10/2021, 8:13 AM

## 2021-03-11 LAB — BASIC METABOLIC PANEL
Anion gap: 6 (ref 5–15)
BUN: 27 mg/dL — ABNORMAL HIGH (ref 8–23)
CO2: 38 mmol/L — ABNORMAL HIGH (ref 22–32)
Calcium: 9.9 mg/dL (ref 8.9–10.3)
Chloride: 93 mmol/L — ABNORMAL LOW (ref 98–111)
Creatinine, Ser: 1.07 mg/dL — ABNORMAL HIGH (ref 0.44–1.00)
GFR, Estimated: 54 mL/min — ABNORMAL LOW (ref >60)
Glucose, Bld: 94 mg/dL (ref 70–99)
Potassium: 3.8 mmol/L (ref 3.5–5.1)
Sodium: 137 mmol/L (ref 135–145)

## 2021-03-11 LAB — CBC
HCT: 40.3 % (ref 36.0–46.0)
Hemoglobin: 12.7 g/dL (ref 12.0–15.0)
MCH: 27.7 pg (ref 26.0–34.0)
MCHC: 31.5 g/dL (ref 30.0–36.0)
MCV: 87.8 fL (ref 80.0–100.0)
Platelets: 201 10*3/uL (ref 150–400)
RBC: 4.59 MIL/uL (ref 3.87–5.11)
RDW: 16.8 % — ABNORMAL HIGH (ref 11.5–15.5)
WBC: 6.6 10*3/uL (ref 4.0–10.5)
nRBC: 0 % (ref 0.0–0.2)

## 2021-03-11 NOTE — Progress Notes (Signed)
Physical Therapy Treatment Patient Details Name: [name] MRN: [id number] DOB: 22-Jun-1946 Today's Date: 03/11/2021    History of Present Illness [name] is a 45yoF who comes to [facility] from [facility] evening of 7/12 after noted worsening of mentation. Pt admitted with acute metabolic encephalopathy 2/2 UTI. PMH: Cervical spondylosis c myelopathy, Lumbar spondylosis, , RA, syncope, PONV, DM2, dCHF, CKD3a, hypoTSH, COPD on O2 at night, remote bilat TKA, remote Left knee poly exchange, recent Left knee prosthesis instability pending cardiology clearance for revision, bilat reverse TSH, bilat shoulder fractures, Lumbar fusion, cholecysectomy.    PT Comments    Pt in bed upon entry, agreeable to session. Sleepy initially, but this improves with sitting at EOB. Pt on 3L/min at entry (O2 at night only at baseline), but moved to 1L/min for session, sats appropriate throughout, no c/o SOB in session. Pt does well advancing tolerated time at EOB. At end of session, encouraged to remain at EOB longer, but clearly fatigued, then a plan was put in place with pt and DTR to attempt OOB to recliner. Pt has not been OOB since arrival here (6da), but at [facility] typically uses a STS lift OOB to recliner daily. Pt assisted with STS lift to recliner, all needs met, pt comfortable. DTR in room at end of session. NA in room at end of session, made aware of O2 ween to 1L, made aware of STS lift to recliner and author leaving sling on counter. RN made aware of pt in chair.      Follow Up Recommendations  SNF;Supervision for mobility/OOB     Equipment Recommendations  None recommended by PT    Recommendations for Other Services       Precautions / Restrictions Precautions Precautions: Fall Restrictions Other Position/Activity Restrictions: chronic left knee patella dislocation    Mobility  Bed Mobility   Bed Mobility: Supine to Sit     Supine to sit: Min assist     General bed mobility  comments: to Left EOB (pt's left), much less assist needed, onyl at end with trunk righting.    Transfers Overall transfer level:  (does noit transfer right now as she needs Left TKA hardware revision.)                  Ambulation/Gait                 Stairs             Wheelchair Mobility    Modified Rankin (Stroke Patients Only)       Balance Overall balance assessment: Needs assistance     Sitting balance - Comments: can sit EOB for ~25 minutes this session, but does grow fatigued and begin to lean on Left elbow                                    Cognition Arousal/Alertness: Awake/alert Behavior During Therapy: WFL for tasks assessed/performed Overall Cognitive Status: Within Functional Limits for tasks assessed                                        Exercises Other Exercises Other Exercises: @ EOB seated alternate UE sock touch 1x10 each Other Exercises: @ EOB seated LAQ 1x15 bilat (LLE much less movement 2/2 chronic hardware failure) Other Exercises: @ EOB  seated Heel raises x20 bilat    General Comments        Pertinent Vitals/Pain      Home Living                      Prior Function            PT Goals (current goals can now be found in the care plan section) Acute Rehab PT Goals Patient Stated Goal: to get stronger PT Goal Formulation: With patient Time For Goal Achievement: 03/20/21 Potential to Achieve Goals: Good Progress towards PT goals: Progressing toward goals    Frequency    Min 2X/week      PT Plan Current plan remains appropriate    Co-evaluation              AM-PAC PT "6 Clicks" Mobility   Outcome Measure  Help needed turning from your back to your side while in a flat bed without using bedrails?: A Lot Help needed moving from lying on your back to sitting on the side of a flat bed without using bedrails?: A Lot Help needed moving to and from a bed to a  chair (including a wheelchair)?: Total Help needed standing up from a chair using your arms (e.g., wheelchair or bedside chair)?: Total Help needed to walk in hospital room?: Total Help needed climbing 3-5 steps with a railing? : Total 6 Click Score: 8    End of Session Equipment Utilized During Treatment: Oxygen Activity Tolerance: Patient tolerated treatment well;Patient limited by fatigue Patient left: with family/visitor present;with call bell/phone within reach;in chair Nurse Communication: Mobility status PT Visit Diagnosis: Muscle weakness (generalized) (M62.81)     Time: 0964-3838 PT Time Calculation (min) (ACUTE ONLY): 58 min  Charges:  $Therapeutic Exercise: 38-52 mins                    1:26 PM, 03/11/21 [name], PT, DPT Physical Therapist - [facility]  [phone] ([place])     [name] C 03/11/2021, 1:20 PM

## 2021-03-11 NOTE — Discharge Summary (Signed)
Physician Discharge Summary  [name] RKY:[id number] DOB: 01-08-1946 DOA: 03/04/2021  PCP: System, Provider Not In  Admit date: 03/04/2021 Discharge date: 03/11/2021  [facility]: [facility]   Recommendations for Outpatient Follow-up:  Follow up with PCP in 1-2 weeks  Home Health: no  Equipment/Devices:  Discharge Condition: stable  CODE STATUS: full  Diet recommendation: Heart Healthy  Brief/Interim Summary: HPI was taken from Dr. Damita Dunnings: [name] is a 75 y.o. female with medical history significant for Diastolic heart failure on nighttime oxygen, stage IIIa CKD, morbid obesity, hypothyroidism, COPD on nighttime oxygen, who resides at an assisted living facility and was in her usual state of health earlier in the day but then later on became confused to where she could not recognize her daughter.  On arrival to the emergency room she was tearful and yelling out per ED providers report.  According to the daughter this is far from her baseline.   ED course: On arrival afebrile, tachypneic at 32 with pulse 92 blood pressure 113/61 O2 sat 90% on 6 L.  Blood work with lipase of 135 and creatinine of 1.53 which is her baseline but otherwise unremarkable with normal WBC and lactic acid.  Urinalysis with large leukocyte Estrace positive nitrites and many bacteria.  Troponin negative x2.  BNP not done.  COVID and flu negative .   EKG: Sinus rhythm at 93   Imaging: Chest x-ray with pulmonary vascular congestion.  CTA chest negative for acute PE.  CT abdomen and pelvis with contrast with postcholecystectomy findings and otherwise nonacute.  Head and C-spine CTs nonacute   Patient given a dose of Rocephin for UTI, Lasix for CHF.  Hospitalist consulted for admission.   Hospital course from Dr. Jimmye Norman 7/13-7/19/22: Pt presented w/ acute metabolic encephalopathy likely secondary to UTI. Pt completed course of abxs while inpatient. CT head showed no acute findings. Of  note, pt also c/o constipation that was resolved with a bowel regimen while inpatient. PT/OT evaluated pt and recommended pt return to SNF. For more information, please see previous progress notes.    Discharge Diagnoses:  Principal Problem:   Acute metabolic encephalopathy Active Problems:   Acute on chronic diastolic CHF (congestive heart failure) (HCC)   Acute respiratory failure with hypoxia (HCC)   UTI (urinary tract infection)   Stage 3a chronic kidney disease (HCC)   Obesity, Class III, BMI 40-49.9 (morbid obesity) (HCC)   Elevated lipase   COPD (chronic obstructive pulmonary disease) (HCC)  Acute metabolic encephalopathy: likely secondary to UTI , takes multiple sedating medications vs possible delirium vs dementia. Urine cx is growing e. coli. Completed abx course. CT head with no acute findings. Close to baseline   UTI: urine cx growing e. coli. Completed abx course   Constipation: had a bowel movement. Resolved   Chronic diastolic CHF: as per cardio. Continue on coreg and home dose of torsemide as per cardio. Monitor I/Os and daily weights   Hypokalemia: WNL    CKDIIIa: Cr is labile. Will continue to monitor    Morbid obesity: BMI 36.2. Complicates overall care and prognosis    COPD: w/o exacerbation. Continue on bronchodilators and encourage incentive spirometry   RLS: continue on home dose of ropinirole   HLD: continue on statin   Hypothyroidism: continue on home dose of levothyroxine   Depression: severity unknown. Continue on home dose of duloxetine   Discharge Instructions  Discharge Instructions     Diet - low sodium heart healthy  Complete by: As directed    Discharge instructions   Complete by: As directed    F/u w/ PCP in 1-2 weeks   Increase activity slowly   Complete by: As directed       Allergies as of 03/11/2021   No Known Allergies      Medication List     TAKE these medications    acetaminophen 650 MG CR tablet Commonly known  as: TYLENOL Take 650 mg by mouth every 4 (four) hours as needed for pain.   albuterol 108 (90 Base) MCG/ACT inhaler Commonly known as: VENTOLIN HFA Inhale 2 puffs into the lungs every 4 (four) hours as needed.   amLODipine 2.5 MG tablet Commonly known as: NORVASC Take 2.5 mg by mouth daily.   aspirin EC 81 MG tablet Take 1 tablet (81 mg total) by mouth daily.   atorvastatin 40 MG tablet Commonly known as: LIPITOR Take 40 mg by mouth daily.   baclofen 10 MG tablet Commonly known as: LIORESAL Take 10 mg by mouth 3 (three) times daily. (0900, 1400, & 2100)   Basis Facial Moisturizer Crea Apply 1 application topically daily.   carvedilol 6.25 MG tablet Commonly known as: COREG Take 6.25 mg by mouth 2 (two) times daily with a meal.   clopidogrel 75 MG tablet Commonly known as: PLAVIX Take 75 mg by mouth daily.   docusate sodium 100 MG capsule Commonly known as: COLACE Take 100 mg by mouth 2 (two) times daily.   donepezil 10 MG tablet Commonly known as: ARICEPT Take 10 mg by mouth daily.   DULoxetine 60 MG capsule Commonly known as: CYMBALTA Take 120 mg by mouth daily.   gabapentin 800 MG tablet Commonly known as: NEURONTIN Take 800 mg by mouth 3 (three) times daily.   Klor-Con M20 20 MEQ tablet Generic drug: potassium chloride SA Take 1 tablet by mouth every other day.   lamoTRIgine 100 MG tablet Commonly known as: LAMICTAL Take 100 mg by mouth 2 (two) times daily. (0900 & 2100)   levothyroxine 88 MCG tablet Commonly known as: SYNTHROID Take 88 mcg by mouth daily before breakfast. (0900)   lidocaine 5 % Commonly known as: LIDODERM Place 1 patch onto the skin daily. Remove patch at 1900   Melatonin 3 MG Caps Take 3 mg by mouth at bedtime.   mometasone 50 MCG/ACT nasal spray Commonly known as: NASONEX Place 1 spray into the nose daily.   promethazine 12.5 MG tablet Commonly known as: PHENERGAN Take 1 tablet by mouth 3 (three) times daily as needed.    rOPINIRole 3 MG tablet Commonly known as: REQUIP Take 3 mg by mouth 3 (three) times daily.   sennosides-docusate sodium 8.6-50 MG tablet Commonly known as: SENOKOT-S Take 1 tablet by mouth daily.   torsemide 20 MG tablet Commonly known as: DEMADEX Take 80 mg by mouth daily.   traZODone 100 MG tablet Commonly known as: DESYREL Take 100 mg by mouth at bedtime.   VITAMIN B-12 IJ Inject 1,000 mcg as directed every 30 (thirty) days. Weekly for 4 weeks, has 1 more dose this week (week of 7/2) then will switch to once a month       ASK your doctor about these medications    budesonide 0.5 MG/2ML nebulizer solution Commonly known as: PULMICORT Take 0.5 mg by nebulization 2 (two) times daily. (1000 & 2200)   erythromycin ophthalmic ointment 1 application at bedtime.   HYDROcodone-acetaminophen 7.5-325 MG tablet Commonly known as: NORCO Take 1  tablet by mouth every 6 (six) hours as needed.   ipratropium-albuterol 0.5-2.5 (3) MG/3ML Soln Commonly known as: DUONEB Take 3 mLs by nebulization every 6 (six) hours as needed (FOR WHEEZING/SHORTNESS OF BREATH).   omeprazole 20 MG capsule Commonly known as: PRILOSEC Take 20 mg by mouth 2 (two) times daily before a meal. (0900 & 1700)   Vitamin D3 25 MCG (1000 UT) Caps Take 1,000 Units by mouth daily. (0900)        Contact information for after-discharge care     Destination     [facility] .   Service: Skilled Nursing Contact information: [place] [phone]                    No Known Allergies  Consultations:    Procedures/Studies: CT Head Wo Contrast  Result Date: 03/04/2021 CLINICAL DATA:  Moaning and agitation, back and neck pain EXAM: CT HEAD WITHOUT CONTRAST CT CERVICAL SPINE WITHOUT CONTRAST TECHNIQUE: Multidetector CT imaging of the head and cervical spine was performed following the standard protocol without intravenous  contrast. Multiplanar CT image reconstructions of the cervical spine were also generated. COMPARISON:  CT 01/06/2021 FINDINGS: CT HEAD FINDINGS Brain: No evidence of acute infarction, hemorrhage, hydrocephalus, extra-axial collection, visible mass lesion or mass effect. Symmetric prominence of the ventricles, cisterns and sulci compatible with parenchymal volume loss. Patchy areas of [place] matter hypoattenuation are most compatible with chronic microvascular angiopathy. Vascular: Atherosclerotic calcification of the carotid siphons. No hyperdense vessel. Skull: No calvarial fracture or suspicious osseous lesion. No scalp swelling or hematoma. Sinuses/Orbits: Complete opacification of the right maxillary sinus. Some minimally hyperostotic changes. No worrisome stranding or inflammation along the sinus perimeter. Remaining paranasal sinuses are predominantly clear. Mastoid air cells are well aerated. Middle ear cavities are clear. Orbital structures are unremarkable aside from prior lens extractions. Other: Partly edentulous with mandibular prognathism. CT CERVICAL SPINE FINDINGS Alignment: Straightening of normal cervical lordosis. Degenerative stepwise anterolisthesis C3-C5 and retrolisthesis C5 on C6, unchanged from comparison prior. No evidence of traumatic listhesis. No abnormally widened, perched or jumped facets. Some asymmetric degenerative features along the facets, further detailed below. Normal alignment of the craniocervical and atlantoaxial articulations. Skull base and vertebrae: No acute fracture or traumatic osseous injury of the cervical spine or imaged skull base. The osseous structures appear diffusely demineralized which may limit detection of small or nondisplaced fractures. No worrisome lytic or blastic lesions. Multilevel cervical spondylitic changes including subchondral sclerosis and cystic change at multiple levels, as detailed below. Additional arthrosis about the atlantodental interval.  Soft tissues and spinal canal: No pre or paravertebral fluid or swelling. No visible canal hematoma. Airways patent. Cervical carotid atherosclerosis bilaterally. No worrisome adenopathy. Disc levels: Multilevel cervical spondylitic changes are noted. There are multilevel disc osteophyte complexes. In combination with some ligamentum flavum infolding, there is moderate canal stenosis C3-4. More mild canal narrowing is seen C4-5, C5-6 and C6-7. Partial effacement of the ventral thecal sac C7-T1 as well. Multilevel facet degenerative changes with uncinate spurring and hypertrophic features are most pronounced on the left C1-2, bilaterally C3-4 and on the right C5-6. Findings result in mild-to-moderate multilevel neural foraminal narrowing with more moderate to severe narrowing C3-4. Upper chest: No acute abnormality in the upper chest or imaged lung apices. Other: Normal thyroid. Bilateral shoulder arthroplasties noted on scout view. IMPRESSION: 1. No acute intracranial abnormality. No calvarial fracture or scalp swelling. 2. No cervical spine fracture or  traumatic listhesis. 3. Background of microvascular angiopathy, parenchymal volume loss, and both intracranial and cervical carotid atherosclerosis. 4. Opacification the right maxillary sinus with some hyperostotic features suggesting chronicity. 5. Multilevel cervical spondylitic change in facet degenerative features, as detailed above. Findings result in moderate canal stenosis C3-4 with more mild canal narrowing C4-C7 as well as moderate to severe foraminal narrowing C3-4 and mild-to-moderate foraminal narrowing elsewhere throughout the cervical spine. Electronically Signed   By: Lovena Le M.D.   On: 03/04/2021 23:52   CT Angio Chest PE W and/or Wo Contrast  Result Date: 03/04/2021 CLINICAL DATA:  75 year old female with concern for pulmonary embolism. Abdominal pain. Concern for kidney stone. EXAM: CT ANGIOGRAPHY CHEST CT ABDOMEN AND PELVIS WITH CONTRAST  TECHNIQUE: Multidetector CT imaging of the chest was performed using the standard protocol during bolus administration of intravenous contrast. Multiplanar CT image reconstructions and MIPs were obtained to evaluate the vascular anatomy. Multidetector CT imaging of the abdomen and pelvis was performed using the standard protocol during bolus administration of intravenous contrast. CONTRAST:  50mL OMNIPAQUE IOHEXOL 350 MG/ML SOLN COMPARISON:  Chest CT dated 10/23/2003 and radiograph dated 03/04/2021. FINDINGS: CTA CHEST FINDINGS Cardiovascular: Mild cardiomegaly. There is coronary vascular calcification. No pericardial effusion. There is mild atherosclerotic calcification of the thoracic aorta. No aneurysmal dilatation or dissection. The origins of the great vessels of the aortic arch appear patent as visualized. Evaluation of the pulmonary arteries is very limited due to severe respiratory motion artifact and suboptimal visualization and evaluation of the peripheral branches. No large central pulmonary artery embolus identified. Mediastinum/Nodes: No hilar or mediastinal adenopathy. The esophagus and the thyroid gland are grossly unremarkable. No mediastinal fluid collection. Lungs/Pleura: Bilateral lower lobe predominant, left greater right streaky and hazy densities may represent edema. Pneumonia is not excluded clinical correlation is recommended. No focal consolidation, pleural effusion, or pneumothorax. The central airways are patent. Musculoskeletal: Bilateral shoulder arthroplasties. No acute osseous pathology. Review of the MIP images confirms the above findings. CT ABDOMEN and PELVIS FINDINGS No intra-abdominal free air or free fluid. Hepatobiliary: The liver is unremarkable. There is intrahepatic biliary ductal dilatation. Cholecystectomy. No retained calcified stone noted in the central CBD. The common bile duct measures approximately 14 mm in diameter. Pancreas: Unremarkable. No pancreatic ductal  dilatation or surrounding inflammatory changes. Spleen: Normal in size without focal abnormality. Adrenals/Urinary Tract: There is a 15 mm right adrenal nodule, indeterminate. The left adrenal glands unremarkable. There is no hydronephrosis on either side. The visualized ureters and the urinary bladder appear unremarkable Stomach/Bowel: There is sigmoid diverticulosis without active inflammatory changes. There is no bowel obstruction or active inflammation. The appendix is not visualized with certainty. No inflammatory changes identified in the right lower quadrant. Vascular/Lymphatic: Advanced aortoiliac atherosclerotic disease. The IVC is unremarkable. No portal venous gas. There is no adenopathy. Reproductive: Hysterectomy. No adnexal masses. Other: None Musculoskeletal: Osteopenia with severe degenerative changes of the spine. L2-L3 disc spacer. No acute osseous pathology. Review of the MIP images confirms the above findings. IMPRESSION: 1. No CT evidence of central pulmonary artery embolus. 2. Bilateral streaky and hazy densities may represent edema. Pneumonia is not excluded clinical correlation is recommended. 3. Sigmoid diverticulosis. No bowel obstruction. 4. Dilatation of the intrahepatic and extrahepatic biliary tree, likely post cholecystectomy. No retained calcified stone noted. MRCP may provide better evaluation, on a nonemergent/outpatient basis, if clinically indicated. 5. Aortic Atherosclerosis (ICD10-I70.0). Electronically Signed   By: Anner Crete M.D.   On: 03/04/2021 23:56   CT Cervical  Spine Wo Contrast  Result Date: 03/04/2021 CLINICAL DATA:  Moaning and agitation, back and neck pain EXAM: CT HEAD WITHOUT CONTRAST CT CERVICAL SPINE WITHOUT CONTRAST TECHNIQUE: Multidetector CT imaging of the head and cervical spine was performed following the standard protocol without intravenous contrast. Multiplanar CT image reconstructions of the cervical spine were also generated. COMPARISON:  CT  01/06/2021 FINDINGS: CT HEAD FINDINGS Brain: No evidence of acute infarction, hemorrhage, hydrocephalus, extra-axial collection, visible mass lesion or mass effect. Symmetric prominence of the ventricles, cisterns and sulci compatible with parenchymal volume loss. Patchy areas of [place] matter hypoattenuation are most compatible with chronic microvascular angiopathy. Vascular: Atherosclerotic calcification of the carotid siphons. No hyperdense vessel. Skull: No calvarial fracture or suspicious osseous lesion. No scalp swelling or hematoma. Sinuses/Orbits: Complete opacification of the right maxillary sinus. Some minimally hyperostotic changes. No worrisome stranding or inflammation along the sinus perimeter. Remaining paranasal sinuses are predominantly clear. Mastoid air cells are well aerated. Middle ear cavities are clear. Orbital structures are unremarkable aside from prior lens extractions. Other: Partly edentulous with mandibular prognathism. CT CERVICAL SPINE FINDINGS Alignment: Straightening of normal cervical lordosis. Degenerative stepwise anterolisthesis C3-C5 and retrolisthesis C5 on C6, unchanged from comparison prior. No evidence of traumatic listhesis. No abnormally widened, perched or jumped facets. Some asymmetric degenerative features along the facets, further detailed below. Normal alignment of the craniocervical and atlantoaxial articulations. Skull base and vertebrae: No acute fracture or traumatic osseous injury of the cervical spine or imaged skull base. The osseous structures appear diffusely demineralized which may limit detection of small or nondisplaced fractures. No worrisome lytic or blastic lesions. Multilevel cervical spondylitic changes including subchondral sclerosis and cystic change at multiple levels, as detailed below. Additional arthrosis about the atlantodental interval. Soft tissues and spinal canal: No pre or paravertebral fluid or swelling. No visible canal hematoma. Airways  patent. Cervical carotid atherosclerosis bilaterally. No worrisome adenopathy. Disc levels: Multilevel cervical spondylitic changes are noted. There are multilevel disc osteophyte complexes. In combination with some ligamentum flavum infolding, there is moderate canal stenosis C3-4. More mild canal narrowing is seen C4-5, C5-6 and C6-7. Partial effacement of the ventral thecal sac C7-T1 as well. Multilevel facet degenerative changes with uncinate spurring and hypertrophic features are most pronounced on the left C1-2, bilaterally C3-4 and on the right C5-6. Findings result in mild-to-moderate multilevel neural foraminal narrowing with more moderate to severe narrowing C3-4. Upper chest: No acute abnormality in the upper chest or imaged lung apices. Other: Normal thyroid. Bilateral shoulder arthroplasties noted on scout view. IMPRESSION: 1. No acute intracranial abnormality. No calvarial fracture or scalp swelling. 2. No cervical spine fracture or traumatic listhesis. 3. Background of microvascular angiopathy, parenchymal volume loss, and both intracranial and cervical carotid atherosclerosis. 4. Opacification the right maxillary sinus with some hyperostotic features suggesting chronicity. 5. Multilevel cervical spondylitic change in facet degenerative features, as detailed above. Findings result in moderate canal stenosis C3-4 with more mild canal narrowing C4-C7 as well as moderate to severe foraminal narrowing C3-4 and mild-to-moderate foraminal narrowing elsewhere throughout the cervical spine. Electronically Signed   By: Lovena Le M.D.   On: 03/04/2021 23:52   CT ABDOMEN PELVIS W CONTRAST  Result Date: 03/04/2021 CLINICAL DATA:  75 year old female with concern for pulmonary embolism. Abdominal pain. Concern for kidney stone. EXAM: CT ANGIOGRAPHY CHEST CT ABDOMEN AND PELVIS WITH CONTRAST TECHNIQUE: Multidetector CT imaging of the chest was performed using the standard protocol during bolus administration  of intravenous contrast. Multiplanar CT image reconstructions and  MIPs were obtained to evaluate the vascular anatomy. Multidetector CT imaging of the abdomen and pelvis was performed using the standard protocol during bolus administration of intravenous contrast. CONTRAST:  70mL OMNIPAQUE IOHEXOL 350 MG/ML SOLN COMPARISON:  Chest CT dated 10/23/2003 and radiograph dated 03/04/2021. FINDINGS: CTA CHEST FINDINGS Cardiovascular: Mild cardiomegaly. There is coronary vascular calcification. No pericardial effusion. There is mild atherosclerotic calcification of the thoracic aorta. No aneurysmal dilatation or dissection. The origins of the great vessels of the aortic arch appear patent as visualized. Evaluation of the pulmonary arteries is very limited due to severe respiratory motion artifact and suboptimal visualization and evaluation of the peripheral branches. No large central pulmonary artery embolus identified. Mediastinum/Nodes: No hilar or mediastinal adenopathy. The esophagus and the thyroid gland are grossly unremarkable. No mediastinal fluid collection. Lungs/Pleura: Bilateral lower lobe predominant, left greater right streaky and hazy densities may represent edema. Pneumonia is not excluded clinical correlation is recommended. No focal consolidation, pleural effusion, or pneumothorax. The central airways are patent. Musculoskeletal: Bilateral shoulder arthroplasties. No acute osseous pathology. Review of the MIP images confirms the above findings. CT ABDOMEN and PELVIS FINDINGS No intra-abdominal free air or free fluid. Hepatobiliary: The liver is unremarkable. There is intrahepatic biliary ductal dilatation. Cholecystectomy. No retained calcified stone noted in the central CBD. The common bile duct measures approximately 14 mm in diameter. Pancreas: Unremarkable. No pancreatic ductal dilatation or surrounding inflammatory changes. Spleen: Normal in size without focal abnormality. Adrenals/Urinary Tract:  There is a 15 mm right adrenal nodule, indeterminate. The left adrenal glands unremarkable. There is no hydronephrosis on either side. The visualized ureters and the urinary bladder appear unremarkable Stomach/Bowel: There is sigmoid diverticulosis without active inflammatory changes. There is no bowel obstruction or active inflammation. The appendix is not visualized with certainty. No inflammatory changes identified in the right lower quadrant. Vascular/Lymphatic: Advanced aortoiliac atherosclerotic disease. The IVC is unremarkable. No portal venous gas. There is no adenopathy. Reproductive: Hysterectomy. No adnexal masses. Other: None Musculoskeletal: Osteopenia with severe degenerative changes of the spine. L2-L3 disc spacer. No acute osseous pathology. Review of the MIP images confirms the above findings. IMPRESSION: 1. No CT evidence of central pulmonary artery embolus. 2. Bilateral streaky and hazy densities may represent edema. Pneumonia is not excluded clinical correlation is recommended. 3. Sigmoid diverticulosis. No bowel obstruction. 4. Dilatation of the intrahepatic and extrahepatic biliary tree, likely post cholecystectomy. No retained calcified stone noted. MRCP may provide better evaluation, on a nonemergent/outpatient basis, if clinically indicated. 5. Aortic Atherosclerosis (ICD10-I70.0). Electronically Signed   By: Anner Crete M.D.   On: 03/04/2021 23:56   DG Chest Portable 1 View  Result Date: 03/04/2021 CLINICAL DATA:  Altered mental status EXAM: PORTABLE CHEST 1 VIEW COMPARISON:  01/06/2021 FINDINGS: Cardiac shadow is at the upper limits of normal in size. CardioMEMS device is again seen and stable. The lungs are well aerated bilaterally. Mild central vascular congestion is seen without interstitial edema. No focal infiltrate is noted. Bilateral shoulder replacements are seen. IMPRESSION: Mild vascular congestion without interstitial edema. Electronically Signed   By: Inez Catalina M.D.    On: 03/04/2021 21:39   ECHOCARDIOGRAM COMPLETE  Result Date: 03/05/2021    ECHOCARDIOGRAM REPORT   Patient Name:   NAVI EWTON Bolger Date of Exam: 03/05/2021 Medical Rec #:  [id number]        Height:       66.0 in Accession #:    [id number]       Weight:  275.6 lb Date of Birth:  1946/01/30         BSA:          2.292 m Patient Age:    75 years         BP:           144/61 mmHg Patient Gender: F                HR:           63 bpm. Exam Location:  [facility] Procedure: 2D Echo, Color Doppler, Cardiac Doppler and Intracardiac            Opacification Agent Indications:     I50.31 CHF-Acute Diastolic  History:         Patient has prior history of Echocardiogram examinations. CKD                  and TIA, Signs/Symptoms:Dementia; Risk Factors:Hypertension,                  Dyslipidemia, Sleep Apnea and Diabetes.  Sonographer:     Charmayne Sheer RDCS (AE) Referring Phys:  [id number] Athena Masse Diagnosing Phys: Kate Sable MD  Sonographer Comments: Suboptimal apical window and no subcostal window. Global longitudinal strain was attempted. IMPRESSIONS  1. Left ventricular ejection fraction, by estimation, is 60 to 65%. The left ventricle has normal function. The left ventricle has no regional wall motion abnormalities. There is mild left ventricular hypertrophy. Left ventricular diastolic parameters are consistent with Grade I diastolic dysfunction (impaired relaxation).  2. Right ventricular systolic function is normal. The right ventricular size is normal.  3. The mitral valve is normal in structure. No evidence of mitral valve regurgitation.  4. The aortic valve is tricuspid. Aortic valve regurgitation is not visualized. Mild aortic valve sclerosis is present, with no evidence of aortic valve stenosis.  5. The inferior vena cava is normal in size with greater than 50% respiratory variability, suggesting right atrial pressure of 3 mmHg. FINDINGS  Left Ventricle: Left ventricular ejection fraction, by estimation, is  60 to 65%. The left ventricle has normal function. The left ventricle has no regional wall motion abnormalities. The left ventricular internal cavity size was normal in size. There is  mild left ventricular hypertrophy. Left ventricular diastolic parameters are consistent with Grade I diastolic dysfunction (impaired relaxation). Right Ventricle: The right ventricular size is normal. No increase in right ventricular wall thickness. Right ventricular systolic function is normal. Left Atrium: Left atrial size was normal in size. Right Atrium: Right atrial size was normal in size. Pericardium: There is no evidence of pericardial effusion. Mitral Valve: The mitral valve is normal in structure. No evidence of mitral valve regurgitation. MV peak gradient, 3.3 mmHg. The mean mitral valve gradient is 1.0 mmHg. Tricuspid Valve: The tricuspid valve is normal in structure. Tricuspid valve regurgitation is not demonstrated. Aortic Valve: The aortic valve is tricuspid. Aortic valve regurgitation is not visualized. Mild aortic valve sclerosis is present, with no evidence of aortic valve stenosis. Aortic valve mean gradient measures 4.0 mmHg. Aortic valve peak gradient measures 8.8 mmHg. Aortic valve area, by VTI measures 2.71 cm. Pulmonic Valve: The pulmonic valve was normal in structure. Pulmonic valve regurgitation is not visualized. Aorta: The aortic root is normal in size and structure. Venous: The inferior vena cava is normal in size with greater than 50% respiratory variability, suggesting right atrial pressure of 3 mmHg. IAS/Shunts: No atrial level shunt detected by color flow Doppler.  LEFT VENTRICLE PLAX 2D LVIDd:         4.70 cm  Diastology LVIDs:         2.70 cm  LV e' medial:    7.72 cm/s LV PW:         1.00 cm  LV E/e' medial:  7.0 LV IVS:        1.20 cm  LV e' lateral:   8.49 cm/s LVOT diam:     2.10 cm  LV E/e' lateral: 6.4 LV SV:         73 LV SV Index:   32 LVOT Area:     3.46 cm  RIGHT VENTRICLE RV Basal diam:   3.90 cm LEFT ATRIUM           Index       RIGHT ATRIUM           Index LA diam:      3.50 cm 1.53 cm/m  RA Area:     10.70 cm LA Vol (A4C): 42.9 ml 18.72 ml/m RA Volume:   21.60 ml  9.43 ml/m  AORTIC VALVE                   PULMONIC VALVE AV Area (Vmax):    2.39 cm    PV Vmax:       1.31 m/s AV Area (Vmean):   2.79 cm    PV Vmean:      95.200 cm/s AV Area (VTI):     2.71 cm    PV VTI:        0.276 m AV Vmax:           148.00 cm/s PV Peak grad:  6.9 mmHg AV Vmean:          95.100 cm/s PV Mean grad:  4.0 mmHg AV VTI:            0.271 m AV Peak Grad:      8.8 mmHg AV Mean Grad:      4.0 mmHg LVOT Vmax:         102.00 cm/s LVOT Vmean:        76.600 cm/s LVOT VTI:          0.212 m LVOT/AV VTI ratio: 0.78  AORTA Ao Root diam: 3.20 cm MITRAL VALVE MV Area (PHT): 2.58 cm    SHUNTS MV Area VTI:   3.30 cm    Systemic VTI:  0.21 m MV Peak grad:  3.3 mmHg    Systemic Diam: 2.10 cm MV Mean grad:  1.0 mmHg MV Vmax:       0.91 m/s MV Vmean:      43.5 cm/s MV Decel Time: 294 msec MV E velocity: 54.40 cm/s MV A velocity: 92.10 cm/s MV E/A ratio:  0.59 Kate Sable MD Electronically signed by Kate Sable MD Signature Date/Time: 03/05/2021/1:19:45 PM    Final    (Echo, Carotid, EGD, Colonoscopy, ERCP)    Subjective: pt c/o fatigue    Discharge Exam: Vitals:   03/11/21 0805 03/11/21 1128  BP: (!) 129/50 (!) 144/57  Pulse: 64 67  Resp: 18 18  Temp: 98.2 F (36.8 C) 98.6 F (37 C)  SpO2: 94% 95%   Vitals:   03/11/21 0407 03/11/21 0805 03/11/21 0815 03/11/21 1128  BP: (!) 123/57 (!) 129/50  (!) 144/57  Pulse: 61 64  67  Resp: 17 18  18   Temp: 98.9 F (37.2 C) 98.2 F (36.8 C)  98.6 F (  37 C)  TempSrc: Oral     SpO2: 95% 94%  95%  Weight:   100.6 kg   Height:        General: Pt is alert, awake, not in acute distress Cardiovascular:S1/S2 +, no rubs, no gallops Respiratory: CTA bilaterally, no wheezing, no rhonchi Abdominal: Soft, NT, obese, bowel sounds + Extremities: no  cyanosis    The results of significant diagnostics from this hospitalization (including imaging, microbiology, ancillary and laboratory) are listed below for reference.     Microbiology: Recent Results (from the past 240 hour(s))  Resp Panel by RT-PCR (Flu A&B, Covid) Nasopharyngeal Swab     Status: None   Collection Time: 03/04/21  9:33 PM   Specimen: Nasopharyngeal Swab; Nasopharyngeal(NP) swabs in vial transport medium  Result Value Ref Range Status   SARS Coronavirus 2 by RT PCR NEGATIVE NEGATIVE Final    Comment: (NOTE) SARS-CoV-2 target nucleic acids are NOT DETECTED.  The SARS-CoV-2 RNA is generally detectable in upper respiratory specimens during the acute phase of infection. The lowest concentration of SARS-CoV-2 viral copies this assay can detect is 138 copies/mL. A negative result does not preclude SARS-Cov-2 infection and should not be used as the sole basis for treatment or other patient management decisions. A negative result may occur with  improper specimen collection/handling, submission of specimen other than nasopharyngeal swab, presence of viral mutation(s) within the areas targeted by this assay, and inadequate number of viral copies(<138 copies/mL). A negative result must be combined with clinical observations, patient history, and epidemiological information. The expected result is Negative.  Fact Sheet for Patients:  EntrepreneurPulse.com.au  Fact Sheet for Healthcare Providers:  IncredibleEmployment.be  This test is no t yet approved or cleared by the [place] FDA and  has been authorized for detection and/or diagnosis of SARS-CoV-2 by FDA under an Emergency Use Authorization (EUA). This EUA will remain  in effect (meaning this test can be used) for the duration of the COVID-19 declaration under Section 564(b)(1) of the Act, 21 U.S.C.section 360bbb-3(b)(1), unless the authorization is terminated  or revoked  sooner.       Influenza A by PCR NEGATIVE NEGATIVE Final   Influenza B by PCR NEGATIVE NEGATIVE Final    Comment: (NOTE) The Xpert Xpress SARS-CoV-2/FLU/RSV plus assay is intended as an aid in the diagnosis of influenza from Nasopharyngeal swab specimens and should not be used as a sole basis for treatment. Nasal washings and aspirates are unacceptable for Xpert Xpress SARS-CoV-2/FLU/RSV testing.  Fact Sheet for Patients: EntrepreneurPulse.com.au  Fact Sheet for Healthcare Providers: IncredibleEmployment.be  This test is not yet approved or cleared by the [place] FDA and has been authorized for detection and/or diagnosis of SARS-CoV-2 by FDA under an Emergency Use Authorization (EUA). This EUA will remain in effect (meaning this test can be used) for the duration of the COVID-19 declaration under Section 564(b)(1) of the Act, 21 U.S.C. section 360bbb-3(b)(1), unless the authorization is terminated or revoked.  Performed at [facility], [address], [place], [place] [address]   Urine Culture     Status: Abnormal   Collection Time: 03/04/21  9:33 PM   Specimen: Urine, Random  Result Value Ref Range Status   Specimen Description   Final    URINE, RANDOM Performed at [facility], [address] [place], [place] [address]    Special Requests   Final    NONE Performed at [facility], [address] [place], [place] [address]  Culture >=100,000 COLONIES/mL ESCHERICHIA COLI (A)  Final   Report Status 03/07/2021 FINAL  Final   Organism ID, Bacteria ESCHERICHIA COLI (A)  Final      Susceptibility   Escherichia coli - MIC*    AMPICILLIN >=32 RESISTANT Resistant     CEFAZOLIN <=4 SENSITIVE Sensitive     CEFEPIME <=0.12 SENSITIVE Sensitive     CEFTRIAXONE <=0.25 SENSITIVE Sensitive     CIPROFLOXACIN >=4 RESISTANT Resistant     GENTAMICIN <=1 SENSITIVE Sensitive     IMIPENEM <=0.25 SENSITIVE  Sensitive     NITROFURANTOIN <=16 SENSITIVE Sensitive     TRIMETH/SULFA >=320 RESISTANT Resistant     AMPICILLIN/SULBACTAM 4 SENSITIVE Sensitive     PIP/TAZO <=4 SENSITIVE Sensitive     * >=100,000 COLONIES/mL ESCHERICHIA COLI     Labs: BNP (last 3 results) No results for input(s): BNP in the last 8760 hours. Basic Metabolic Panel: Recent Labs  Lab 03/07/21 0452 03/08/21 0550 03/09/21 0535 03/10/21 0453 03/11/21 0358  NA 135 135 136 136 137  K 3.7 3.4* 3.8 3.6 3.8  CL 91* 91* 91* 91* 93*  CO2 35* 34* 37* 36* 38*  GLUCOSE 99 97 100* 97 94  BUN 26* 24* 26* 28* 27*  CREATININE 1.09* 1.18* 1.13* 1.20* 1.07*  CALCIUM 9.8 9.8 9.9 9.9 9.9   Liver Function Tests: Recent Labs  Lab 03/04/21 2130  AST 25  ALT 16  ALKPHOS 116  BILITOT 1.2  PROT 7.6  ALBUMIN 3.9   Recent Labs  Lab 03/04/21 2130  LIPASE 135*   No results for input(s): AMMONIA in the last 168 hours. CBC: Recent Labs  Lab 03/07/21 0452 03/08/21 0550 03/09/21 0535 03/10/21 0453 03/11/21 0358  WBC 6.5 5.5 5.4 5.7 6.6  HGB 13.1 12.2 12.3 13.0 12.7  HCT 42.3 39.0 38.3 41.3 40.3  MCV 87.8 88.8 90.3 88.8 87.8  PLT 205 182 173 189 201   Cardiac Enzymes: No results for input(s): CKTOTAL, CKMB, CKMBINDEX, TROPONINI in the last 168 hours. BNP: Invalid input(s): POCBNP CBG: No results for input(s): GLUCAP in the last 168 hours. D-Dimer No results for input(s): DDIMER in the last 72 hours. Hgb A1c No results for input(s): HGBA1C in the last 72 hours. Lipid Profile No results for input(s): CHOL, HDL, LDLCALC, TRIG, CHOLHDL, LDLDIRECT in the last 72 hours. Thyroid function studies No results for input(s): TSH, T4TOTAL, T3FREE, THYROIDAB in the last 72 hours.  Invalid input(s): FREET3 Anemia work up No results for input(s): VITAMINB12, FOLATE, FERRITIN, TIBC, IRON, RETICCTPCT in the last 72 hours. Urinalysis    Component Value Date/Time   COLORURINE YELLOW (A) 03/04/2021 2133   APPEARANCEUR CLOUDY  (A) 03/04/2021 2133   LABSPEC 1.016 03/04/2021 2133   PHURINE 5.0 03/04/2021 2133   GLUCOSEU NEGATIVE 03/04/2021 2133   HGBUR SMALL (A) 03/04/2021 2133   BILIRUBINUR NEGATIVE 03/04/2021 2133   KETONESUR NEGATIVE 03/04/2021 2133   PROTEINUR NEGATIVE 03/04/2021 2133   UROBILINOGEN 0.2 11/10/2012 1350   NITRITE POSITIVE (A) 03/04/2021 2133   LEUKOCYTESUR LARGE (A) 03/04/2021 2133   Sepsis Labs Invalid input(s): PROCALCITONIN,  WBC,  LACTICIDVEN Microbiology Recent Results (from the past 240 hour(s))  Resp Panel by RT-PCR (Flu A&B, Covid) Nasopharyngeal Swab     Status: None   Collection Time: 03/04/21  9:33 PM   Specimen: Nasopharyngeal Swab; Nasopharyngeal(NP) swabs in vial transport medium  Result Value Ref Range Status   SARS Coronavirus 2 by RT PCR NEGATIVE NEGATIVE Final    Comment: (NOTE)  SARS-CoV-2 target nucleic acids are NOT DETECTED.  The SARS-CoV-2 RNA is generally detectable in upper respiratory specimens during the acute phase of infection. The lowest concentration of SARS-CoV-2 viral copies this assay can detect is 138 copies/mL. A negative result does not preclude SARS-Cov-2 infection and should not be used as the sole basis for treatment or other patient management decisions. A negative result may occur with  improper specimen collection/handling, submission of specimen other than nasopharyngeal swab, presence of viral mutation(s) within the areas targeted by this assay, and inadequate number of viral copies(<138 copies/mL). A negative result must be combined with clinical observations, patient history, and epidemiological information. The expected result is Negative.  Fact Sheet for Patients:  EntrepreneurPulse.com.au  Fact Sheet for Healthcare Providers:  IncredibleEmployment.be  This test is no t yet approved or cleared by the [place] FDA and  has been authorized for detection and/or diagnosis of SARS-CoV-2 by FDA  under an Emergency Use Authorization (EUA). This EUA will remain  in effect (meaning this test can be used) for the duration of the COVID-19 declaration under Section 564(b)(1) of the Act, 21 U.S.C.section 360bbb-3(b)(1), unless the authorization is terminated  or revoked sooner.       Influenza A by PCR NEGATIVE NEGATIVE Final   Influenza B by PCR NEGATIVE NEGATIVE Final    Comment: (NOTE) The Xpert Xpress SARS-CoV-2/FLU/RSV plus assay is intended as an aid in the diagnosis of influenza from Nasopharyngeal swab specimens and should not be used as a sole basis for treatment. Nasal washings and aspirates are unacceptable for Xpert Xpress SARS-CoV-2/FLU/RSV testing.  Fact Sheet for Patients: EntrepreneurPulse.com.au  Fact Sheet for Healthcare Providers: IncredibleEmployment.be  This test is not yet approved or cleared by the [place] FDA and has been authorized for detection and/or diagnosis of SARS-CoV-2 by FDA under an Emergency Use Authorization (EUA). This EUA will remain in effect (meaning this test can be used) for the duration of the COVID-19 declaration under Section 564(b)(1) of the Act, 21 U.S.C. section 360bbb-3(b)(1), unless the authorization is terminated or revoked.  Performed at [facility], [address] [place], [place] [address]   Urine Culture     Status: Abnormal   Collection Time: 03/04/21  9:33 PM   Specimen: Urine, Random  Result Value Ref Range Status   Specimen Description   Final    URINE, RANDOM Performed at [facility], [address] [place], [place] [address]    Special Requests   Final    NONE Performed at [facility], [place], [place] [address]    Culture >=100,000 COLONIES/mL ESCHERICHIA COLI (A)  Final   Report Status 03/07/2021 FINAL  Final   Organism ID, Bacteria ESCHERICHIA COLI (A)  Final      Susceptibility   Escherichia coli - MIC*     AMPICILLIN >=32 RESISTANT Resistant     CEFAZOLIN <=4 SENSITIVE Sensitive     CEFEPIME <=0.12 SENSITIVE Sensitive     CEFTRIAXONE <=0.25 SENSITIVE Sensitive     CIPROFLOXACIN >=4 RESISTANT Resistant     GENTAMICIN <=1 SENSITIVE Sensitive     IMIPENEM <=0.25 SENSITIVE Sensitive     NITROFURANTOIN <=16 SENSITIVE Sensitive     TRIMETH/SULFA >=320 RESISTANT Resistant     AMPICILLIN/SULBACTAM 4 SENSITIVE Sensitive     PIP/TAZO <=4 SENSITIVE Sensitive     * >=100,000 COLONIES/mL ESCHERICHIA COLI     Time coordinating discharge: Over 30 minutes  SIGNED:   Wyvonnia Dusky, MD  [facility] 03/11/2021, 11:37 AM Pager   If 7PM-7AM, please contact night-coverage

## 2021-03-11 NOTE — Progress Notes (Signed)
[facility] to give [facility] on [facility].  Sent AVS documentation with transport.

## 2021-03-11 NOTE — TOC Transition Note (Signed)
Transition of Care [facility]) - CM/SW Discharge Note   Patient Details  Name: [name] MRN: [id number] Date of Birth: [birth year]  Transition of Care [facility]) CM/SW Contact:  Alberteen Sam, LCSW Phone Number: 03/11/2021, 12:07 PM   Clinical Narrative:     Patient will DC to: [facility] Anticipated DC date: 03/11/21 Family notified: daughter at bedside Transport by: Johnanna Schneiders  Per MD patient ready for DC to [facility] . RN, patient, patient's family, and facility notified of DC. Discharge Summary sent to facility. RN given number for report  [phone] Room 711. DC packet on chart. Ambulance transport requested for patient.  CSW signing off.  Pricilla Riffle, LCSW   Final next level of care: Skilled Nursing Facility Barriers to Discharge: No Barriers Identified   Patient Goals and CMS Choice Patient states their goals for this hospitalization and ongoing recovery are:: for pt to go to SNF CMS Medicare.gov Compare Post Acute Care list provided to:: Patient Choice offered to / list presented to : Patient  Discharge Placement              Patient chooses bed at: [facility] [place] Patient to be transferred to facility by: [facility] Name of family member notified: daughter at bedside Patient and family notified of of transfer: 03/11/21  Discharge Plan and Services In-house Referral: Clinical Social Work   Post Acute Care Choice: [place]                               Social Determinants of Health ([place]) Interventions     Readmission Risk Interventions No flowsheet data found.

## 2021-03-11 NOTE — TOC Progression Note (Addendum)
Transition of Care [facility]) - Progression Note    Patient Details  Name: [name] MRN: [id number] Date of Birth: 1946/03/11  Transition of Care [facility]) CM/SW [place],  Phone Number: 03/11/2021, 8:29 AM  Clinical Narrative:     Update: Patient's insurance is [facility] commercial which [facility] reports they do not take. CSW informed family, they requested csw reach out to [facility]. CSW spoke with Horris Latino with admissions at [facility] who reports no long term beds available. CSW explained to patient family that patient is medically stable to discharge and [facility] for [facility]. If family wishes for patient to go to a different facility they will need to continue to research long term bed availabilities while [facility] is at [facility]. MD updated.   Elyse Hsu with [facility] reports they are able to offer a bed for patient, and are reviewing bed availability to see when patient can come. Pending bed availability information at this time.    Expected Discharge Plan: [place] Barriers to Discharge: Continued Medical Work up  Expected Discharge Plan and Services Expected Discharge Plan: [place] In-house Referral: Clinical Social Work   Post Acute Care Choice: [place] Living arrangements for the past 2 months: [place]                                       Social Determinants of Health (SDOH) Interventions    Readmission Risk Interventions No flowsheet data found.

## 2021-03-17 ENCOUNTER — Encounter: Payer: Medicare Other | Admitting: Occupational Therapy

## 2021-03-24 ENCOUNTER — Encounter: Payer: Medicare Other | Admitting: Occupational Therapy

## 2021-04-07 ENCOUNTER — Ambulatory Visit (HOSPITAL_COMMUNITY)
Admission: RE | Admit: 2021-04-07 | Discharge: 2021-04-07 | Disposition: A | Discharge status: Home / Self Care | Payer: 59 | Source: Ambulatory Visit | Attending: Cardiology | Admitting: Cardiology

## 2021-04-07 ENCOUNTER — Other Ambulatory Visit: Payer: Self-pay

## 2021-04-07 ENCOUNTER — Encounter (HOSPITAL_COMMUNITY): Payer: Self-pay | Admitting: Cardiology

## 2021-04-07 VITALS — BP 110/70 | HR 81

## 2021-04-07 DIAGNOSIS — I5032 Chronic diastolic (congestive) heart failure: Secondary | ICD-10-CM | POA: Insufficient documentation

## 2021-04-07 DIAGNOSIS — Z7951 Long term (current) use of inhaled steroids: Secondary | ICD-10-CM | POA: Insufficient documentation

## 2021-04-07 DIAGNOSIS — I2510 Atherosclerotic heart disease of native coronary artery without angina pectoris: Secondary | ICD-10-CM | POA: Diagnosis not present

## 2021-04-07 DIAGNOSIS — F0390 Unspecified dementia without behavioral disturbance: Secondary | ICD-10-CM | POA: Diagnosis not present

## 2021-04-07 DIAGNOSIS — Z7902 Long term (current) use of antithrombotics/antiplatelets: Secondary | ICD-10-CM | POA: Diagnosis not present

## 2021-04-07 DIAGNOSIS — M797 Fibromyalgia: Secondary | ICD-10-CM | POA: Diagnosis not present

## 2021-04-07 DIAGNOSIS — I444 Left anterior fascicular block: Secondary | ICD-10-CM | POA: Diagnosis not present

## 2021-04-07 DIAGNOSIS — Z79899 Other long term (current) drug therapy: Secondary | ICD-10-CM | POA: Diagnosis not present

## 2021-04-07 DIAGNOSIS — N183 Chronic kidney disease, stage 3 unspecified: Secondary | ICD-10-CM | POA: Insufficient documentation

## 2021-04-07 DIAGNOSIS — I130 Hypertensive heart and chronic kidney disease with heart failure and stage 1 through stage 4 chronic kidney disease, or unspecified chronic kidney disease: Secondary | ICD-10-CM | POA: Insufficient documentation

## 2021-04-07 DIAGNOSIS — I890 Lymphedema, not elsewhere classified: Secondary | ICD-10-CM

## 2021-04-07 DIAGNOSIS — Z7982 Long term (current) use of aspirin: Secondary | ICD-10-CM | POA: Insufficient documentation

## 2021-04-07 LAB — BASIC METABOLIC PANEL
Anion gap: 8 (ref 5–15)
BUN: 21 mg/dL (ref 8–23)
CO2: 33 mmol/L — ABNORMAL HIGH (ref 22–32)
Calcium: 10.3 mg/dL (ref 8.9–10.3)
Chloride: 96 mmol/L — ABNORMAL LOW (ref 98–111)
Creatinine, Ser: 1.36 mg/dL — ABNORMAL HIGH (ref 0.44–1.00)
GFR, Estimated: 40 mL/min — ABNORMAL LOW (ref >60)
Glucose, Bld: 101 mg/dL — ABNORMAL HIGH (ref 70–99)
Potassium: 4.2 mmol/L (ref 3.5–5.1)
Sodium: 137 mmol/L (ref 135–145)

## 2021-04-07 LAB — LIPID PANEL
Cholesterol: 181 mg/dL (ref 0–200)
HDL: 62 mg/dL (ref >40)
LDL Cholesterol: 100 mg/dL — ABNORMAL HIGH (ref 0–99)
Total CHOL/HDL Ratio: 2.9 RATIO
Triglycerides: 96 mg/dL (ref <150)
VLDL: 19 mg/dL (ref 0–40)

## 2021-04-07 LAB — BRAIN NATRIURETIC PEPTIDE: B Natriuretic Peptide: 11 pg/mL (ref 0.0–100.0)

## 2021-04-07 NOTE — Progress Notes (Signed)
Advanced Heart Failure Clinic Note   Primary Care: Dr. Edrick Oh Renal: Dr Joelyn Oms HF Cardiology: Dr. Aundra Dubin  HPI: [name] is a 75 y.o. with history of chronic diastolic CHF (Echo 99991111 LVEF 65%, Mild LAE), chronic lymphedema, chronic back pain, HTN, CKD stage 3, and fibromyalgia.    Admitted several times in 02/2016 to [facility] with CHF and chest pain.  Has been tried on spironolactone + metolazone alone as diuretic regimen. Follows with renal as above.   She was admitted in 9/17 with chest pain and AKI.  She had seen nephrology and was given metolazone to take daily along with Lasix, creatinine rose considerably.  Lasix was decreased to 40 mg bid.  When creatinine improved, she had right and left heart cath showing nonobstructive CAD and near-normal filling pressures.  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    Had Cardiomems placed on 11/13/16. PA diastolic 9 mmHg at implant time.   Most recent echo in 7/22 showed EF 60-65%, normal RV.   She was admitted in 7/22 with acute metabolic encephalopathy, thought to be due to UTI.   She presents today for followup of diastolic CHF.  She remains in SNF.  She has history of left knee THR, this has degenerated and she is in need of redo surgery.  She is doing minimal walking due to left knee pain, mainly bed-bound. No dyspnea or chest pain at rest.    Cardiomems: PADP 5 mmHg  ECG (personally reviewed): NSR, LAFB  Labs (9/17): K 4.6, creatinine 1.55, BNP 63 Labs (10/17): K 4.7, creatinine 1.8 Labs ( 09/06/2016): K 3.7 Creatinine 2.05  Labs (1/18): K 5.1, creatinine 2.03 Labs (2/18): hgb 12, K 4.3, creatinine 1.87 Labs (11/13/2016) K 3.8 Creatinine 2.12  Labs (10/18): K 3.7, creatinine 1.6 Labs (1/19): K 4, creatinine 1.46 Labs (7/22): K 3.8, creatinine 1.04.   Review of systems complete and found to be negative unless listed in HPI.    Past Medical History 1. Chronic diastolic CHF - Echo  99991111 LVEF 65%, Mild LAE - RHC (10/17): mean RA 4, PA 25/11, mean PCWP 5, CI 2.4.  - Echo (2/18): EF 55-60%, mild LVH, normal RV size and systolic function. - Cardiomems implanted 3/18.  - Echo (7/22): EF 60-65%, normal RV, IVC normal 2. CKD stage 3: Follows with nephology. Baseline appears to be creatinine 1.4-1.6.  3. Chronic lymphedema: Has had wraps.  4. HTN 5. Dementia 6. Fibromyalgia 7. Microvascular angina:  - Normal coronaries on Cath 2009 - Stress Myoview 04/09/16 with no ischemia - LHC (10/17) with mild nonobstructive disease.  8. Restless leg syndrome 9. H/o shoulder replacement.  10. OSA: CPAP.   FH: Mother with CHF, brother with "heart disease."  Another brother with h/o MI.   Current Outpatient Medications  Medication Sig Dispense Refill   acetaminophen (TYLENOL) 650 MG CR tablet Take 650 mg by mouth every 4 (four) hours as needed for pain.     albuterol (VENTOLIN HFA) 108 (90 Base) MCG/ACT inhaler Inhale 2 puffs into the lungs every 4 (four) hours as needed.     amLODipine (NORVASC) 2.5 MG tablet Take 2.5 mg by mouth daily.     aspirin EC 81 MG tablet Take 1 tablet (81 mg total) by mouth daily. 90 tablet 3   atorvastatin (LIPITOR) 40 MG tablet Take 40 mg by mouth daily.     baclofen (LIORESAL) 10 MG tablet Take 10 mg by mouth 3 (three) times daily. (0900,  1400, & 2100)     budesonide (PULMICORT) 0.5 MG/2ML nebulizer solution Take 0.5 mg by nebulization 2 (two) times daily. (1000 & 2200)     carvedilol (COREG) 6.25 MG tablet Take 6.25 mg by mouth 2 (two) times daily with a meal.     Cholecalciferol (VITAMIN D3) 1000 units CAPS Take 1,000 Units by mouth daily. (0900)     clopidogrel (PLAVIX) 75 MG tablet Take 75 mg by mouth daily.     Cyanocobalamin (VITAMIN B-12 IJ) Inject 1,000 mcg as directed every 30 (thirty) days. Weekly for 4 weeks, has 1 more dose this week (week of 7/2) then will switch to once a month     docusate sodium (COLACE) 100 MG capsule Take 100 mg by  mouth 2 (two) times daily.     donepezil (ARICEPT) 10 MG tablet Take 10 mg by mouth daily.     DULoxetine (CYMBALTA) 60 MG capsule Take 120 mg by mouth daily.     gabapentin (NEURONTIN) 800 MG tablet Take 800 mg by mouth 3 (three) times daily.     HYDROcodone-acetaminophen (NORCO) 7.5-325 MG tablet Take 1 tablet by mouth every 6 (six) hours as needed.     ipratropium-albuterol (DUONEB) 0.5-2.5 (3) MG/3ML SOLN Take 3 mLs by nebulization every 6 (six) hours as needed (FOR WHEEZING/SHORTNESS OF BREATH).     KLOR-CON M20 20 MEQ tablet Take 1 tablet by mouth every other day.     lamoTRIgine (LAMICTAL) 100 MG tablet Take 100 mg by mouth 2 (two) times daily. (0900 & 2100)     levothyroxine (SYNTHROID, LEVOTHROID) 88 MCG tablet Take 88 mcg by mouth daily before breakfast. (0900)     lidocaine (LIDODERM) 5 % Place 1 patch onto the skin daily. Remove patch at 1900     Melatonin 3 MG CAPS Take 3 mg by mouth at bedtime.     mometasone (NASONEX) 50 MCG/ACT nasal spray Place 1 spray into the nose daily.     omeprazole (PRILOSEC) 20 MG capsule Take 20 mg by mouth 2 (two) times daily before a meal. (0900 & 1700)     promethazine (PHENERGAN) 12.5 MG tablet Take 1 tablet by mouth 3 (three) times daily as needed.     rOPINIRole (REQUIP) 3 MG tablet Take 3 mg by mouth 3 (three) times daily.     sennosides-docusate sodium (SENOKOT-S) 8.6-50 MG tablet Take 1 tablet by mouth daily.     Skin Protectants, Misc. (BASIS FACIAL MOISTURIZER) CREA Apply 1 application topically daily.     torsemide (DEMADEX) 20 MG tablet Take 80 mg by mouth daily.     traZODone (DESYREL) 100 MG tablet Take 100 mg by mouth at bedtime.     No current facility-administered medications for this encounter.   No Known Allergies  Social History   Socioeconomic History   Marital status: Widowed    Spouse name: Not on file   Number of children: 3   Years of education: Not on file   Highest education level: Not on file  Occupational History    Occupation: retired  Tobacco Use   Smoking status: Never   Smokeless tobacco: Never  Vaping Use   Vaping Use: Never used  Substance and Sexual Activity   Alcohol use: No    Alcohol/week: 0.0 standard drinks   Drug use: No   Sexual activity: Not Currently  Other Topics Concern   Not on file  Social History Narrative   Pt does use caffeine. Lives alone. 3 children.  Retired.   Social Determinants of Health   Financial Resource Strain: Not on file  Food Insecurity: Not on file  Transportation Needs: Not on file  Physical Activity: Not on file  Stress: Not on file  Social Connections: Not on file  Intimate Partner Violence: Not on file   Vitals:   04/07/21 1148  BP: 110/70  Pulse: 81  SpO2: 92%   Wt Readings from Last 3 Encounters:  03/11/21 100.6 kg (221 lb 12.8 oz)  01/06/21 117 kg (257 lb 15 oz)  12/03/20 117 kg (258 lb)   PHYSICAL EXAM: General: NAD Neck: No JVD, no thyromegaly or thyroid nodule.  Lungs: Clear to auscultation bilaterally with normal respiratory effort. CV: Nondisplaced PMI.  Heart regular S1/S2, no S3/S4, no murmur.  Bilateral lower extremity lymphedema  No carotid bruit.  Normal pedal pulses.  Abdomen: Soft, nontender, no hepatosplenomegaly, no distention.  Skin: Intact without lesions or rashes.  Neurologic: Alert and oriented x 3.  Psych: Normal affect. Extremities: No clubbing or cyanosis.  HEENT: Normal.   ASSESSMENT & PLAN: 1. Chronic diastolic CHF:  Echo (0000000) with EF 55-60%, normal RV, echo (7/22) with EF 60-65%, normal RV.  Has Cardiomems. PADP at goal at 62mHg today (goal has been 6-13 mmHg).  She does not have significant JVD but has chronic lymphedema.  Stable NYHA class III symptoms.  - Continue current torsemide 80 mg daily.  BMET/BNP today.  2. Lymphedema: She needs to continue to use lymphedema pumps bid.  3. CKD: Stage 3 - BMET today.  4. Chest pain: LHC 2017 Nonobstructive.  None recently.   5. HTN:  Reasonably controlled.    6. Pre-operative evaluation: Patients needs redo left TKR.  From a cardiac perspective, she has been stable by echo and Cardiomems.  I think that her greater risks for surgery will be post-operative mobilization and neurological issues (she has dementia at baseline, could be worsened with anesthesia).  Also with OSA and at risk for difficult vent weaning.  From a cardiac standpoint, she would be moderate risk and can proceed with surgery. However, would involve her neurologist and likely her PCP as well in this decision-making.   Followup 3 months.   DLoralie Champagne MD  04/07/2021

## 2021-04-07 NOTE — Patient Instructions (Signed)
EKG done today.  Labs done today. We will contact you only if your labs are abnormal.  No medication changes were made. Please continue all current medications as prescribed.  Your physician recommends that you schedule a follow-up appointment in: 6 months with our [facility] here in our office. Please contact our office in January to schedule a February appointment.   If you have any questions or concerns before your next appointment please send [place] a message through [place] or call our office at [phone].    TO LEAVE A MESSAGE FOR THE NURSE SELECT OPTION 2, PLEASE LEAVE A MESSAGE INCLUDING: YOUR NAME DATE OF BIRTH CALL BACK NUMBER REASON FOR CALL**this is important as we prioritize the call backs  YOU WILL RECEIVE A CALL BACK THE SAME DAY AS LONG AS YOU CALL BEFORE 4:00 PM   Do the following things EVERYDAY: Weigh yourself in the morning before breakfast. Write it down and keep it in a log. Take your medicines as prescribed Eat low salt foods--Limit salt (sodium) to 2000 mg per day.  Stay as active as you can everyday Limit all fluids for the day to less than 2 liters   At the [facility], you and your health needs are our priority. As part of our continuing mission to provide you with exceptional heart care, we have created designated Provider Care Teams. These Care Teams include your primary Cardiologist (physician) and Advanced Practice Providers (APPs- Physician Assistants and Nurse Practitioners) who all work together to provide you with the care you need, when you need it.   You may see any of the following providers on your designated Care Team at your next follow up: Dr Glori Bickers Dr Haynes Kerns, NP Lyda Jester, [place] Audry Riles, PharmD   Please be sure to bring in all your medications bottles to every appointment.

## 2021-04-29 ENCOUNTER — Other Ambulatory Visit: Payer: Self-pay | Admitting: Nephrology

## 2021-04-29 DIAGNOSIS — E1122 Type 2 diabetes mellitus with diabetic chronic kidney disease: Secondary | ICD-10-CM

## 2021-04-29 DIAGNOSIS — I129 Hypertensive chronic kidney disease with stage 1 through stage 4 chronic kidney disease, or unspecified chronic kidney disease: Secondary | ICD-10-CM

## 2021-05-07 ENCOUNTER — Other Ambulatory Visit: Payer: Self-pay

## 2021-05-07 ENCOUNTER — Ambulatory Visit (INDEPENDENT_AMBULATORY_CARE_PROVIDER_SITE_OTHER): Payer: 59

## 2021-05-07 DIAGNOSIS — E1122 Type 2 diabetes mellitus with diabetic chronic kidney disease: Secondary | ICD-10-CM | POA: Diagnosis not present

## 2021-05-07 DIAGNOSIS — N183 Chronic kidney disease, stage 3 unspecified: Secondary | ICD-10-CM | POA: Diagnosis not present

## 2021-05-07 DIAGNOSIS — I129 Hypertensive chronic kidney disease with stage 1 through stage 4 chronic kidney disease, or unspecified chronic kidney disease: Secondary | ICD-10-CM

## 2021-05-12 ENCOUNTER — Telehealth: Payer: Self-pay | Admitting: Orthopaedic Surgery

## 2021-05-12 NOTE — Telephone Encounter (Signed)
Pt called stating she is wanting to get her surgery scheduled and would like a CB ASAP; pt states she would like to get something soon as she has been waiting for awhile.   [phone]

## 2021-05-12 NOTE — Telephone Encounter (Signed)
See Dr. Claris Gladden last note.  Who all do you want clearances from?  Need surgery sheet.  Thanks!

## 2021-05-13 ENCOUNTER — Telehealth: Payer: Self-pay | Admitting: Orthopedic Surgery

## 2021-05-13 NOTE — Telephone Encounter (Signed)
Pt wanted to make sure Dr.Duda knew that [name] F. did an amazing job taking out her stitches and she appreciated him listening to her when she complained that the first person was too rough.

## 2021-05-13 NOTE — Telephone Encounter (Signed)
I called patient and scheduled appointment with Dr. Ninfa Linden.

## 2021-05-19 ENCOUNTER — Telehealth: Payer: Self-pay

## 2021-05-19 NOTE — Telephone Encounter (Signed)
Needing a call back to let her know what to expect for her new pt appt.  Please advise.  Call back: [phone]  Thanks, Helene Kelp

## 2021-05-19 NOTE — Telephone Encounter (Signed)
Called patient.  No answer.

## 2021-05-20 ENCOUNTER — Other Ambulatory Visit: Payer: Self-pay

## 2021-05-20 ENCOUNTER — Ambulatory Visit (INDEPENDENT_AMBULATORY_CARE_PROVIDER_SITE_OTHER): Payer: Medicare Other | Admitting: Urology

## 2021-05-20 ENCOUNTER — Encounter: Payer: Self-pay | Admitting: Urology

## 2021-05-20 VITALS — BP 105/55 | HR 76 | Ht 65.0 in | Wt 212.0 lb

## 2021-05-20 DIAGNOSIS — N3946 Mixed incontinence: Secondary | ICD-10-CM

## 2021-05-20 DIAGNOSIS — N390 Urinary tract infection, site not specified: Secondary | ICD-10-CM | POA: Diagnosis not present

## 2021-05-20 NOTE — Progress Notes (Signed)
Urological Symptom Review  Patient is experiencing the following symptoms: Hard to postpone urination Burning/pain with urination Leakage of urine Stream starts and stops   Review of Systems  Gastrointestinal (upper)  : Negative for upper GI symptoms  Gastrointestinal (lower) : Negative for lower GI symptoms  Constitutional : Weight loss  Skin: Negative for skin symptoms  Eyes: Blurred vision  Ear/Nose/Throat : Negative for Ear/Nose/Throat symptoms  Hematologic/Lymphatic: Swollen glands Easy bruising  Cardiovascular : Leg swelling  Respiratory : Cough Shortness of breath Endocrine: Excessive thirst  Musculoskeletal: Back pain Joint pain  Neurological: Dizziness  Psychologic: Depression

## 2021-05-20 NOTE — Progress Notes (Signed)
Assessment: 1. Urinary incontinence, mixed   2. Frequent UTI      Plan: Diagnosis and management of an urge incontinence discussed with the patient and her daughter today.  It appears that her primary issue is urge and unaware incontinence.  I discussed the diagnosis and management of urge and unaware incontinence including avoidance of dietary irritants, behavioral modification, medical therapy, neuromodulation, and chemodenervation. We also discussed management of frequent UTIs and methods to prevent UTIs including increased fluid intake, timed and double voiding, use of a daily cranberry supplement and daily probiotics.  The potential role of antibiotic suppression was also discussed. I will request all recent urine cultures for review. Will ask the nursing home to obtain a cath urine sample and send results to my office. Trial of Myrbetriq 50 mg daily.   Return to office in 4-6 weeks  Chief Complaint:  Chief Complaint  Patient presents with   Urinary Incontinence     History of Present Illness:  [name] is a 75 y.o. year old female who is seen in consultation from Amber. Theador Hawthorne, MD for evaluation of urinary incontinence.  She reports incontinence symptoms for >15 years.  She has urinary frequency, and urgency.  She also reports both stress and urge incontinence.  She leaks with coughing and sneezing.  She reports large volume leakage with urgency.  She does have episodes of unaware incontinence as well.  She is using depends with pantiliners both day and night.  She is currently nonambulatory secondary to a knee injury and is awaiting orthopedic surgery.  She is unable to stand for any significant period of time and requires assistance to use the toilet. She does report a history of frequent UTIs with an increase in frequency in the past 6 months.  She has been hospitalized for a UTI earlier this year. Urine culture from 7/22 showed >100 K E. coli.  She was last treated  for a UTI last week.  No culture results available. She does report problems with constipation. She has not been on any medical therapy for her incontinence symptoms. She does have chronic kidney disease and is followed by nephrology. No recent gross hematuria or flank pain.   Past Medical History:  Past Medical History:  Diagnosis Date   Anemia    Anxiety    Arthritis    "hands, feet" (03/03/2017)   Brain lesion    2 types   Cataract    Cervical spondylosis with myelopathy    Chest pain    Normal cardiac cath 5/09   Chronic lower back pain    CKD (chronic kidney disease), stage III ([place])    [name]   Congestive heart failure (CHF) (HCC)    has a Cardiomems implant    Constipation    Dementia ([place])    Depression    Dumping syndrome    Dyspnea    with activity   Edema    Fatigue    Fibromyalgia    GERD (gastroesophageal reflux disease)    Headache    "w/high CBG" (03/03/2017)   History of echocardiogram    a. Echo 03/20/16 (done at [facility] in [place], [place]):  mild LVH, EF 35%, normal diastolic function, mild LAE, MAC, RVSP 25 mmHg   Hyperlipidemia    Hypertension    Hypothyroidism    Lumbar spondylosis    Lymphedema    seeing specialist for this   Migraine    "mostly stopped when I  changed my diet" (03/03/2017)   OSA on CPAP    Pneumonia X 1   PONV (postoperative nausea and vomiting)    Restless leg syndrome    Rheumatoid arthritis ([place])    Stroke ([place])    TIAs "mini strokes"- unsure of last TIA - pt and daughter deny this   Syncope    Tremors of nervous system    Type II diabetes mellitus ([place])     Past Surgical History:  Past Surgical History:  Procedure Laterality Date   APPENDECTOMY  1966   BACK SURGERY     CARDIAC CATHETERIZATION N/A 05/25/2016   Procedure: Right/Left Heart Cath and Coronary Angiography;  Surgeon: Larey Dresser, MD;  Location: [facility];  Service: Cardiovascular;  Laterality: N/A;   CATARACT  EXTRACTION W/ INTRAOCULAR LENS  IMPLANT, BILATERAL Bilateral    COLONOSCOPY     CORONARY ANGIOGRAM  2009   Normal coronaries   EXCISION/RELEASE BURSA HIP Bilateral    I & D KNEE WITH POLY EXCHANGE Left 03/02/2017   Procedure: Left knee Revision of poly liner;  Surgeon: Mcarthur Rossetti, MD;  Location: [place];  Service: Orthopedics;  Laterality: Left;   JOINT REPLACEMENT     KNEE ARTHROSCOPY Bilateral    LAPAROSCOPIC CHOLECYSTECTOMY  2015   LMF  2017   CardiMEMS HF implant for CHF (measures amount of fluid in the heart)   LUMBAR DISC SURGERY     LUMBAR LAMINECTOMY/DECOMPRESSION MICRODISCECTOMY Left 12/2005   L2-3 laminectomy and diskectomy/notes 01/06/2011   POSTERIOR LUMBAR FUSION  04/2006   [name] 01/06/2011; "put cages in"   REVERSE SHOULDER ARTHROPLASTY Left 06/15/2017   Procedure: REVERSE LEFT SHOULDER ARTHROPLASTY;  Surgeon: Meredith Pel, MD;  Location: [place];  Service: Orthopedics;  Laterality: Left;   REVERSE SHOULDER ARTHROPLASTY Right 02/01/2018   REVERSE SHOULDER ARTHROPLASTY Right 02/01/2018   Procedure: RIGHT REVERSE SHOULDER ARTHROPLASTY;  Surgeon: Meredith Pel, MD;  Location: [facility];  Service: Orthopedics;  Laterality: Right;   REVISION TOTAL KNEE ARTHROPLASTY Left 03/02/2017   poly liner/notes 03/02/2017   RIGHT HEART CATH N/A 11/13/2016   Procedure: Right Heart Cath with Cardiomems;  Surgeon: Larey Dresser, MD;  Location: [facility];  Service: Cardiovascular;  Laterality: N/A;   SHOULDER ARTHROSCOPY WITH ROTATOR CUFF REPAIR Right    SHOULDER OPEN ROTATOR CUFF REPAIR Left 01/2011   [name] 01/29/2011   TOTAL ABDOMINAL HYSTERECTOMY     TOTAL KNEE ARTHROPLASTY Left    TOTAL KNEE ARTHROPLASTY Right 11/18/2012   Procedure: TOTAL KNEE ARTHROPLASTY;  Surgeon: Yvette Rack., MD;  Location: [place] [facility];  Service: Orthopedics;  Laterality: Right;   TUBAL LIGATION     UPPER GASTROINTESTINAL ENDOSCOPY     UPPER GI ENDOSCOPY      Allergies:  No Known  Allergies  Family History:  Family History  Problem Relation Age of Onset   Heart disease Mother    Heart failure Mother    Kidney disease Mother    Lung cancer Father    Colon cancer Brother    Cancer Brother        malignant thymoma   Heart disease Brother    Tremor Maternal Grandmother 90   Ovarian cancer Maternal Grandmother    Uterine cancer Maternal Grandmother    Irritable bowel syndrome Daughter    Esophageal cancer Neg Hx    Stomach cancer Neg Hx    Inflammatory bowel disease Neg Hx    Liver disease Neg Hx  Pancreatic cancer Neg Hx    Rectal cancer Neg Hx     Social History:  Social History   Tobacco Use   Smoking status: Never   Smokeless tobacco: Never  Vaping Use   Vaping Use: Never used  Substance Use Topics   Alcohol use: No    Alcohol/week: 0.0 standard drinks   Drug use: No    Review of symptoms:  Constitutional:  Negative for unexplained weight loss, night sweats, fever, chills ENT:  Negative for nose bleeds, sinus pain, painful swallowing CV:  Negative for chest pain, shortness of breath, exercise intolerance, palpitations, loss of consciousness Resp:  Negative for cough, wheezing, shortness of breath GI:  Negative for nausea, vomiting, diarrhea, bloody stools GU:  Positives noted in HPI; otherwise negative for gross hematuria Neuro:  Negative for seizures, poor balance, limb weakness, slurred speech Psych:  Negative for lack of energy, depression, anxiety Endocrine:  Negative for polydipsia, polyuria, symptoms of hypoglycemia (dizziness, hunger, sweating) Hematologic:  Negative for anemia, purpura, petechia, prolonged or excessive bleeding, use of anticoagulants  Allergic:  Negative for difficulty breathing or choking as a result of exposure to anything; no shellfish allergy; no allergic response (rash/itch) to materials, foods  Physical exam: BP (!) 105/55   Pulse 76   Ht _0  (1.651 m)   Wt 212 lb (96.2 kg)   BMI 35.28 kg/m  GENERAL  APPEARANCE:  Well appearing, well developed, well nourished, NAD HEENT: Atraumatic, Normocephalic, oropharynx clear. NECK: Supple without lymphadenopathy or thyromegaly. LUNGS: Clear to auscultation bilaterally. HEART: Regular Rate and Rhythm without murmurs, gallops, or rubs. ABDOMEN: Soft, non-tender, No Masses. EXTREMITIES: Moves all extremities well.  Without clubbing, cyanosis, or edema. NEUROLOGIC:  Alert and oriented x 3, in wheelchair, CN II-XII grossly intact.  MENTAL STATUS:  Appropriate. BACK:  Non-tender to palpation.  No CVAT SKIN:  Warm, dry and intact.    Results: No specimen provided  Bladder Scan:  119 ml  (not true postvoid residual)

## 2021-05-21 ENCOUNTER — Ambulatory Visit (INDEPENDENT_AMBULATORY_CARE_PROVIDER_SITE_OTHER): Payer: Medicare Other | Admitting: Orthopaedic Surgery

## 2021-05-21 ENCOUNTER — Encounter: Payer: Self-pay | Admitting: Orthopaedic Surgery

## 2021-05-21 DIAGNOSIS — G8929 Other chronic pain: Secondary | ICD-10-CM | POA: Diagnosis not present

## 2021-05-21 DIAGNOSIS — M25562 Pain in left knee: Secondary | ICD-10-CM

## 2021-05-21 DIAGNOSIS — T84018D Broken internal joint prosthesis, other site, subsequent encounter: Secondary | ICD-10-CM | POA: Diagnosis not present

## 2021-05-21 DIAGNOSIS — Z96659 Presence of unspecified artificial knee joint: Secondary | ICD-10-CM

## 2021-05-21 NOTE — Progress Notes (Signed)
The patient comes in today to discuss further with her family the idea of pursuing a left knee revision arthroplasty.  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  She is also significant mount of weight and her BMI is now down to 35.  She has been cleared for surgery from her cardiologist and neurologist as well as her primary care physician.  She sees urologist as well.  In the office today we had a long and thorough discussion as we have had before about knee revision surgery.  She understands this is a difficult endeavor and there is no guarantees that we can get her is aligned as well as possible we will try hard because her quality of life is to the point where it is definitely affecting her life in general dealing with this knee.  She does not walk much and does not stand and she is highly motivated now showing that she is lost weight.  Had a long and thorough discussion about knee revision surgery for her left knee.  This would involve removing all components and realigning the components with a longer stem prosthesis and trying to assure that we get the patella tracking better.  I talked about the risks of acute blood loss anemia, nerve and vessel injury, infection, DVT and continued implant failure.  We talked about her goals being decrease pain, improve mobility and fully overall improved quality of life.  I do feel it is appropriate at this point to proceed with surgery given that we have exhausted and failed all conservative treatment measures at this standpoint.  She is currently now not on blood thinning medication and is off of Plavix.  She is only taking aspirin.  She will continue her weight loss journey and we will work on getting her surgery scheduled at the next available for revision surgery for her failed left total knee arthroplasty.  We would then see her back at 2 weeks postoperative.

## 2021-05-26 ENCOUNTER — Telehealth: Payer: Self-pay

## 2021-05-26 NOTE — Telephone Encounter (Signed)
Faxed

## 2021-05-26 NOTE — Telephone Encounter (Signed)
[place] would like last office note faxed and clarification on PT for patient to work on?  Fax# [phone] attn:Therapy.  Cb# [phone].  Please advise.  Thank you.

## 2021-05-30 ENCOUNTER — Other Ambulatory Visit: Payer: Self-pay

## 2021-06-06 ENCOUNTER — Telehealth: Payer: Self-pay | Admitting: Orthopaedic Surgery

## 2021-06-06 NOTE — Telephone Encounter (Signed)
Patient called. She has some questions. Her other doctor is talking about doing surgery as well. Wanted to know if it would be ok. Her call back number is [phone]

## 2021-06-09 NOTE — Telephone Encounter (Signed)
Daughter aware of the below message

## 2021-06-12 ENCOUNTER — Other Ambulatory Visit (HOSPITAL_BASED_OUTPATIENT_CLINIC_OR_DEPARTMENT_OTHER): Payer: Self-pay

## 2021-06-12 ENCOUNTER — Ambulatory Visit: Payer: Medicare Other | Admitting: Internal Medicine

## 2021-06-12 DIAGNOSIS — R32 Unspecified urinary incontinence: Secondary | ICD-10-CM | POA: Insufficient documentation

## 2021-06-12 DIAGNOSIS — Z87440 Personal history of urinary (tract) infections: Secondary | ICD-10-CM | POA: Insufficient documentation

## 2021-06-13 ENCOUNTER — Other Ambulatory Visit: Payer: Self-pay

## 2021-06-17 ENCOUNTER — Encounter: Payer: Self-pay | Admitting: Internal Medicine

## 2021-06-17 ENCOUNTER — Ambulatory Visit (INDEPENDENT_AMBULATORY_CARE_PROVIDER_SITE_OTHER): Payer: Medicare Other | Admitting: Internal Medicine

## 2021-06-17 ENCOUNTER — Other Ambulatory Visit: Payer: Self-pay

## 2021-06-17 DIAGNOSIS — N3941 Urge incontinence: Secondary | ICD-10-CM

## 2021-06-17 DIAGNOSIS — Z87440 Personal history of urinary (tract) infections: Secondary | ICD-10-CM

## 2021-06-17 NOTE — Progress Notes (Signed)
[facility]  Reason for Consult: History of recurrent urinary tract infections  Referring Provider: Dr. Ulice Bold  Assessment: [name] has persistent irritative bladder symptoms that occur on a daily basis with periodic worsening over the past few months.  I doubt that bladder infection is the sole cause of the symptoms since they occur every day.  She tells me that she is not interested in being on a chronic oral antibiotic due to the difficulty she has had tolerating oral antibiotics recently.  She also tells me that she does not think that antibiotics worked very well for her.  I did talk to them about the downside of chronic antibiotic therapy including adverse side effects including C. difficile colitis and the possibility of emerging drug resistance.  I will need to be able to review all of her recent urine cultures and antibiotic treatment regimens before making a final recommendation.  Her daughter will speak with staff at her facility and have them fax me those documents.  I will arrange phone follow-up next week.  Plan: Observe off of antibiotics for now Obtain full records from her facility Follow-up on 06/26/2021  Patient Active Problem List   Diagnosis Date Noted   Urinary incontinence 06/12/2021    Priority: 1.   History of recurrent UTI (urinary tract infection) 06/12/2021    Priority: 1.   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[place]) 03/02/2017   Acute on chronic diastolic CHF (congestive heart failure) ([place]) 03/29/2016   Obesity-BMI 40 01/14/2015   Restless leg 01/14/2015   Back pain 01/14/2015   Dementia- mild memory issues 01/14/2015   Occult blood positive stool 12/14/2014   Anemia 12/14/2014   Family history of colon cancer 12/14/2014   GERD (gastroesophageal reflux disease) 12/14/2014   Hypothyroidism 01/09/2014   Lymphedema 05/17/2012   HTN (hypertension) 05/17/2012    Patient's Medications  New Prescriptions   No medications on file  Previous Medications   ACETAMINOPHEN (TYLENOL) 650 MG CR TABLET    Take 650 mg by mouth every 4 (four) hours as needed for pain.   ALBUTEROL (VENTOLIN HFA) 108 (90 BASE) MCG/ACT INHALER    Inhale 2 puffs into the lungs every 4 (four) hours as needed.   AMLODIPINE (NORVASC) 2.5 MG TABLET    Take 2.5 mg by mouth daily.   ASPIRIN EC 81 MG TABLET    Take 1 tablet (81 mg total) by mouth daily.   ATORVASTATIN (LIPITOR) 40 MG TABLET    Take 40 mg by mouth daily.   BACLOFEN (LIORESAL) 10 MG TABLET    Take 10 mg by mouth 3 (three) times daily. (0900, 1400, & 2100)   BUDESONIDE (PULMICORT) 0.5 MG/2ML NEBULIZER SOLUTION    Take 0.5 mg by nebulization 2 (two) times daily. (1000 & 2200)   CARVEDILOL (COREG) 6.25 MG TABLET    Take 6.25 mg by mouth 2 (two) times daily with a meal.   CHOLECALCIFEROL (VITAMIN D3) 1000 UNITS CAPS  Take 1,000 Units by mouth daily. (0900)   CYANOCOBALAMIN (VITAMIN B-12 IJ)    Inject 1,000 mcg as directed every 30 (thirty) days. Weekly for 4 weeks, has 1 more dose this week (week of 7/2) then will switch to once a month   DOCUSATE SODIUM (COLACE) 100 MG CAPSULE    Take 100 mg by mouth 2 (two) times daily.   DONEPEZIL (ARICEPT) 10 MG TABLET    Take 10 mg by mouth daily.   DULOXETINE (CYMBALTA) 60 MG CAPSULE    Take 120 mg by mouth daily.   GABAPENTIN (NEURONTIN) 800 MG TABLET    Take 800 mg by mouth 3 (three) times daily.    HYDROCODONE-ACETAMINOPHEN (NORCO) 7.5-325 MG TABLET    Take 1 tablet by mouth every 6 (six) hours as needed.   IPRATROPIUM-ALBUTEROL (DUONEB) 0.5-2.5 (3) MG/3ML SOLN    Take 3 mLs by nebulization every 6 (six) hours as needed (FOR WHEEZING/SHORTNESS OF BREATH).   KLOR-CON M20 20 MEQ TABLET    Take 1 tablet by mouth every other day.   LAMOTRIGINE (LAMICTAL) 100 MG TABLET    Take 100 mg by mouth 2 (two) times daily. (0900 & 2100)   LEVOTHYROXINE (SYNTHROID, LEVOTHROID) 88 MCG TABLET    Take 88 mcg by mouth daily before breakfast. (0900)   LIDOCAINE (LIDODERM) 5 %    Place 1 patch onto the skin daily. Remove patch at 1900   MELATONIN 3 MG CAPS    Take 3 mg by mouth at bedtime.   MOMETASONE (NASONEX) 50 MCG/ACT NASAL SPRAY    Place 1 spray into the nose daily.   OMEPRAZOLE (PRILOSEC) 20 MG CAPSULE    Take 20 mg by mouth 2 (two) times daily before a meal. (0900 & 1700)   PROMETHAZINE (PHENERGAN) 12.5 MG TABLET    Take 1 tablet by mouth 3 (three) times daily as needed.   ROPINIROLE (REQUIP) 3 MG TABLET    Take 3 mg by mouth 3 (three) times daily.   SENNOSIDES-DOCUSATE SODIUM (SENOKOT-S) 8.6-50 MG TABLET    Take 1 tablet by mouth daily.   SKIN PROTECTANTS, MISC. (BASIS FACIAL MOISTURIZER) CREA    Apply 1 application topically daily.   TORSEMIDE (DEMADEX) 20 MG TABLET    Take 80 mg by mouth daily.   TRAZODONE (DESYREL) 100 MG TABLET    Take 100 mg by mouth at bedtime.  Modified Medications   No medications on file  Discontinued Medications   No medications on file    HPI: [name] is a 75 y.o. female with a history of recurrent urinary tract infections.  She was hospitalized on 03/04/2021 with acute delirium.  She was afebrile but it was felt that she likely was delirious due to sepsis caused by an E. coli UTI.  She improved fairly promptly with antibiotic therapy and was discharged on 03/11/2021.  Her daughter, who accompanies her today, says that it took her several months before she could  recognize all friends and family members.  Since that time she has had a variety of persistent urinary symptoms.  She has some difficulty describing those symptoms to me.  She told me several times during the exam that her symptoms are constant.  She says that she refers to her bladder as "the house with 4 walls".  She says that she has bothered by cramps in that area.  Sometimes the cramps will be quite severe.  Once the cramps start to subside she feels like she needs  to urinate.  She says that frequently she will go to the bathroom and sit on the toilet but be unable to void for long periods of time.  She says that when she does void she has some mild burning sensation.  She tells me that these symptoms occur every day.  It sounds, however, like there have been episodes where the symptoms are more severe.   Her daughter tells me that multiple urine specimens have been collected at her nursing facility.  Some have been "clean catch" and others have been catheterized specimens.  Unfortunately, I do not have any of those culture reports from her facility.  It sounds as though she has been treated on multiple occasions.  They say that she has difficulty tolerating oral antibiotics due to stomach upset and diarrhea.  She has been on injections of ceftriaxone and also intravenous ceftriaxone on several occasions.  She tells me that with her last round of IV ceftriaxone her symptoms got better but did not completely resolve.  She was evaluated by Dr. Michaelle Birks (urology) 1 month ago who obtained a history of urge incontinence.  He recommended increasing fluid intake, timed voiding, cranberry supplements and probiotics.  She has a failed left knee prosthesis and has a great deal of difficulty standing.  She has full assist when standing.  She cannot ambulate to the bathroom on her own.  As a result she has been largely bedridden and wears pull-up diapers.  Her E. coli isolate in July was resistant to  ampicillin, quinolone antibiotics and trimethoprim/sulfamethoxazole.  The only other urine culture result I have available to me was from 05/27/2021.  At that time she grew Klebsiella pneumonia a resistant only to ampicillin.  When she saw Dr. Theador Hawthorne on 05/21/2021 possibility of chronic suppressive antibiotic therapy was discussed.  Review of Systems: Review of Systems  Constitutional:  Positive for weight loss. Negative for fever.  Gastrointestinal:  Negative for abdominal pain, diarrhea, nausea and vomiting.  Genitourinary:  Positive for dysuria, frequency and urgency. Negative for flank pain and hematuria.     Past Medical History:  Diagnosis Date   Anemia    Anxiety    Arthritis    "hands, feet" (03/03/2017)   Brain lesion    2 types   Cataract    Cervical spondylosis with myelopathy    Chest pain    Normal cardiac cath 5/09   Chronic lower back pain    CKD (chronic kidney disease), stage III ([place])    [name]   Congestive heart failure (CHF) (HCC)    has a Cardiomems implant    Constipation    Dementia ([place])    Depression    Dumping syndrome    Dyspnea    with activity   Edema    Fatigue    Fibromyalgia    GERD (gastroesophageal reflux disease)    Headache    "w/high CBG" (03/03/2017)   History of echocardiogram    a. Echo 03/20/16 (done at [facility] in [place], [place]):  mild LVH, EF 50%, normal diastolic function, mild LAE, MAC, RVSP 25 mmHg   Hyperlipidemia    Hypertension    Hypothyroidism    Lumbar spondylosis    Lymphedema    seeing specialist for this   Migraine    "mostly stopped when I changed my diet" (03/03/2017)   OSA on CPAP    Pneumonia X 1   PONV (postoperative nausea and vomiting)    Restless  leg syndrome    Rheumatoid arthritis (HCC)    Stroke (HCC)    TIAs "mini strokes"- unsure of last TIA - pt and daughter deny this   Syncope    Tremors of nervous system    Type II diabetes mellitus (HCC)     Social History    Tobacco Use   Smoking status: Never   Smokeless tobacco: Never  Vaping Use   Vaping Use: Never used  Substance Use Topics   Alcohol use: No    Alcohol/week: 0.0 standard drinks   Drug use: No    Family History  Problem Relation Age of Onset   Heart disease Mother    Heart failure Mother    Kidney disease Mother    Lung cancer Father    Colon cancer Brother    Cancer Brother        malignant thymoma   Heart disease Brother    Tremor Maternal Grandmother 90   Ovarian cancer Maternal Grandmother    Uterine cancer Maternal Grandmother    Irritable bowel syndrome Daughter    Esophageal cancer Neg Hx    Stomach cancer Neg Hx    Inflammatory bowel disease Neg Hx    Liver disease Neg Hx    Pancreatic cancer Neg Hx    Rectal cancer Neg Hx    No Known Allergies  OBJECTIVE: Vitals:   06/17/21 1411  BP: 118/74  Pulse: 64  Resp: 16  SpO2: 100%  Weight: 212 lb (96.2 kg)  Height: _0  (1.651 m)   Body mass index is 35.28 kg/m.   Physical Exam Constitutional:      Comments: She is seated in her wheelchair.  She became tearful on several occasions during the exam.  She expresses frustration with the fact that she has seen multiple providers but no one seems to be able to make her feel better.  Cardiovascular:     Rate and Rhythm: Normal rate.  Pulmonary:     Effort: Pulmonary effort is normal.  Abdominal:     Palpations: Abdomen is soft.     Tenderness: There is no abdominal tenderness. There is no right CVA tenderness or left CVA tenderness.    Microbiology: No results found for this or any previous visit (from the past 240 hour(s)).  Michel Bickers, MD [facility] for [facility] [phone] pager   [phone] cell 06/17/2021, 4:38 PM

## 2021-06-26 ENCOUNTER — Ambulatory Visit (INDEPENDENT_AMBULATORY_CARE_PROVIDER_SITE_OTHER): Payer: Medicare Other | Admitting: Internal Medicine

## 2021-06-26 ENCOUNTER — Other Ambulatory Visit: Payer: Self-pay

## 2021-06-26 DIAGNOSIS — Z87440 Personal history of urinary (tract) infections: Secondary | ICD-10-CM

## 2021-06-26 NOTE — Progress Notes (Signed)
Virtual Visit via Telephone Note  I connected with [name] on 06/26/21 at  9:30 AM EDT by telephone and verified that I am speaking with the correct person using two identifiers.  Location: Patient: SNF Provider: [facility]   I discussed the limitations, risks, security and privacy concerns of performing an evaluation and management service by telephone and the availability of in person appointments. I also discussed with the patient that there may be a patient responsible charge related to this service. The patient expressed understanding and agreed to proceed.   History of Present Illness: I called and spoke with [name], [name].  I did receive records from her skilled nursing facility.  She has had urine cultures done on 6 separate occasions since January of this year.  She has grown E. coli on 3 occasions, Citrobacter and Klebsiella once each and mixed flora on 1 culture.  All organisms were susceptible to oral antibiotics including nitrofurantoin.  It appears that she has been treated on most occasions with IM or IV ceftriaxone because of difficulty tolerating oral antibiotics.  And said that her mother has been using Depends diapers and that her nurses have been changing them more frequently over the past week.  She thinks that the daily burning she has been having has improved.  She has not had any fever.  Reiterates that her mother has difficulty tolerating oral antibiotics especially with diarrhea and upset stomach.   Observations/Objective:   Assessment and Plan: I do not think that [name] is a good candidate for chronic suppressive antibiotic therapy with nitrofurantoin or other oral antibiotics.  She has not tolerated oral antibiotics well and has expressed a strong desire to not take daily antibiotic therapy.  There is also a strong possibility that her organisms would quickly evolve resistance to what ever she was taking as suppressive therapy.  I would  suggest that focus should be on recognizing episodes that suggest acute infection and treating based on urine cultures at that time.  Follow Up Instructions: I do not recommend daily suppressive antibiotic therapy She can follow-up with me as needed   I discussed the assessment and treatment plan with the patient. The patient was provided an opportunity to ask questions and all were answered. The patient agreed with the plan and demonstrated an understanding of the instructions.   The patient was advised to call back or seek an in-person evaluation if the symptoms worsen or if the condition fails to improve as anticipated.  I provided 15 minutes of non-face-to-face time during this encounter.   Michel Bickers, MD

## 2021-07-02 ENCOUNTER — Ambulatory Visit (INDEPENDENT_AMBULATORY_CARE_PROVIDER_SITE_OTHER): Payer: Medicare Other | Admitting: Urology

## 2021-07-02 ENCOUNTER — Encounter: Payer: Self-pay | Admitting: Urology

## 2021-07-02 ENCOUNTER — Other Ambulatory Visit: Payer: Self-pay

## 2021-07-02 VITALS — BP 155/80 | HR 66

## 2021-07-02 DIAGNOSIS — N390 Urinary tract infection, site not specified: Secondary | ICD-10-CM

## 2021-07-02 DIAGNOSIS — N3946 Mixed incontinence: Secondary | ICD-10-CM

## 2021-07-02 NOTE — Progress Notes (Addendum)
Assessment: 1. Frequent UTI   2. Urinary incontinence, mixed     Plan: Urine culture from nursing home requested after completion of her doxycycline. Will contact her with recommendations. Arrangements for follow-up after evaluation of the urine culture. Continue Myrbetriq 50 mg daily.  Chief Complaint: Chief Complaint  Patient presents with   Urinary Tract Infection    HPI: [name] is a 75 y.o. female who presents for continued evaluation of frequent UTIs and urinary incontinence.    At her initial visit on 05/20/2021, she reported incontinence symptoms for >15 years.  She has urinary frequency, and urgency.  She also reports both stress and urge incontinence.  She leaks with coughing and sneezing.  She reports large volume leakage with urgency.  She does have episodes of unaware incontinence as well.  She is using depends with pantiliners both day and night.  She is currently nonambulatory secondary to a knee injury and is awaiting orthopedic surgery.  She is unable to stand for any significant period of time and requires assistance to use the toilet.  She has a history of frequent UTIs with an increase in frequency in the past 6 months.  She has been hospitalized for a UTI earlier this year. Urine culture from 7/22 showed >100 K E. coli.  She was last treated for a UTI last week.  No culture results available. She does report problems with constipation. Urine culture from 05/27/2021 grew >100 K Klebsiella.  She was treated with appropriate antibiotics.  She returns today for follow-up.  She does report some intermittent dysuria.  She continues with urinary frequency, nocturia, and urinary incontinence.  Currently on doxycycline for lower extremity cellulitis. She was given a trial of Myrbetriq 50 mg daily with improvement in her incontinence, frequency, and urgency.  No side effects reported. Recently seen by Dr. Megan Salon in infectious disease.  He did not feel she was a  good candidate for antibiotic prophylaxis.  Portions of the above documentation were copied from a prior visit for review purposes only.  Allergies: No Known Allergies  PMH: Past Medical History:  Diagnosis Date   Anemia    Anxiety    Arthritis    "hands, feet" (03/03/2017)   Brain lesion    2 types   Cataract    Cervical spondylosis with myelopathy    Chest pain    Normal cardiac cath 5/09   Chronic lower back pain    CKD (chronic kidney disease), stage III ([place])    [name]   Congestive heart failure (CHF) (HCC)    has a Cardiomems implant    Constipation    Dementia ([place])    Depression    Dumping syndrome    Dyspnea    with activity   Edema    Fatigue    Fibromyalgia    GERD (gastroesophageal reflux disease)    Headache    "w/high CBG" (03/03/2017)   History of echocardiogram    a. Echo 03/20/16 (done at [facility] in [place], [place]):  mild LVH, EF 81%, normal diastolic function, mild LAE, MAC, RVSP 25 mmHg   Hyperlipidemia    Hypertension    Hypothyroidism    Lumbar spondylosis    Lymphedema    seeing specialist for this   Migraine    "mostly stopped when I changed my diet" (03/03/2017)   OSA on CPAP    Pneumonia X 1   PONV (postoperative nausea and vomiting)    Restless  leg syndrome    Rheumatoid arthritis (HCC)    Stroke ([place])    TIAs "mini strokes"- unsure of last TIA - pt and daughter deny this   Syncope    Tremors of nervous system    Type II diabetes mellitus ([place])     PSH: Past Surgical History:  Procedure Laterality Date   APPENDECTOMY  1966   BACK SURGERY     CARDIAC CATHETERIZATION N/A 05/25/2016   Procedure: Right/Left Heart Cath and Coronary Angiography;  Surgeon: Larey Dresser, MD;  Location: [facility];  Service: Cardiovascular;  Laterality: N/A;   CATARACT EXTRACTION W/ INTRAOCULAR LENS  IMPLANT, BILATERAL Bilateral    COLONOSCOPY     CORONARY ANGIOGRAM  2009   Normal coronaries   EXCISION/RELEASE BURSA  HIP Bilateral    I & D KNEE WITH POLY EXCHANGE Left 03/02/2017   Procedure: Left knee Revision of poly liner;  Surgeon: Mcarthur Rossetti, MD;  Location: [facility];  Service: Orthopedics;  Laterality: Left;   JOINT REPLACEMENT     KNEE ARTHROSCOPY Bilateral    LAPAROSCOPIC CHOLECYSTECTOMY  2015   LMF  2017   CardiMEMS HF implant for CHF (measures amount of fluid in the heart)   LUMBAR DISC SURGERY     LUMBAR LAMINECTOMY/DECOMPRESSION MICRODISCECTOMY Left 12/2005   L2-3 laminectomy and diskectomy/notes 01/06/2011   POSTERIOR LUMBAR FUSION  04/2006   [name] 01/06/2011; "put cages in"   REVERSE SHOULDER ARTHROPLASTY Left 06/15/2017   Procedure: REVERSE LEFT SHOULDER ARTHROPLASTY;  Surgeon: Meredith Pel, MD;  Location: [facility];  Service: Orthopedics;  Laterality: Left;   REVERSE SHOULDER ARTHROPLASTY Right 02/01/2018   REVERSE SHOULDER ARTHROPLASTY Right 02/01/2018   Procedure: RIGHT REVERSE SHOULDER ARTHROPLASTY;  Surgeon: Meredith Pel, MD;  Location: [facility];  Service: Orthopedics;  Laterality: Right;   REVISION TOTAL KNEE ARTHROPLASTY Left 03/02/2017   poly liner/notes 03/02/2017   RIGHT HEART CATH N/A 11/13/2016   Procedure: Right Heart Cath with Cardiomems;  Surgeon: Larey Dresser, MD;  Location: [facility];  Service: Cardiovascular;  Laterality: N/A;   SHOULDER ARTHROSCOPY WITH ROTATOR CUFF REPAIR Right    SHOULDER OPEN ROTATOR CUFF REPAIR Left 01/2011   [name] 01/29/2011   TOTAL ABDOMINAL HYSTERECTOMY     TOTAL KNEE ARTHROPLASTY Left    TOTAL KNEE ARTHROPLASTY Right 11/18/2012   Procedure: TOTAL KNEE ARTHROPLASTY;  Surgeon: Yvette Rack., MD;  Location: [facility];  Service: Orthopedics;  Laterality: Right;   TUBAL LIGATION     UPPER GASTROINTESTINAL ENDOSCOPY     UPPER GI ENDOSCOPY      SH: Social History   Tobacco Use   Smoking status: Never   Smokeless tobacco: Never  Vaping Use   Vaping Use: Never used  Substance Use Topics   Alcohol use: No     Alcohol/week: 0.0 standard drinks   Drug use: No    ROS: Constitutional:  Negative for fever, chills, weight loss CV: Negative for chest pain, previous MI, hypertension Respiratory:  Negative for shortness of breath, wheezing, sleep apnea, frequent cough GI:  Negative for nausea, vomiting, bloody stool, GERD  PE: BP (!) 155/80   Pulse 66  GENERAL APPEARANCE:  Well appearing, well developed, well nourished, NAD HEENT:  Atraumatic, normocephalic, oropharynx clear NECK:  Supple without lymphadenopathy or thyromegaly ABDOMEN:  Soft, non-tender, no masses EXTREMITIES:  Moves all extremities well, without clubbing, cyanosis, or edema NEUROLOGIC:  Alert and oriented x 3, in wheelchair ,CN II-XII grossly  intact MENTAL STATUS:  appropriate BACK:  Non-tender to palpation, No CVAT SKIN:  Warm, dry, and intact   Results: No specimen provided

## 2021-07-02 NOTE — Progress Notes (Signed)
Urological Symptom Review  Patient is experiencing the following symptoms: Frequent urination Burning/pain with urination Get up at night to urinate Leakage of urine Stream starts and stops Trouble starting stream   Review of Systems  Gastrointestinal (upper)  : Negative for upper GI symptoms  Gastrointestinal (lower) : Negative for lower GI symptoms  Constitutional : Negative for symptoms  Skin: Negative for skin symptoms  Eyes: Blurred vision  Ear/Nose/Throat : Sore throat  Hematologic/Lymphatic: Negative for Hematologic/Lymphatic symptoms  Cardiovascular : Leg swelling  Respiratory : Negative for respiratory symptoms  Endocrine: Negative for endocrine symptoms  Musculoskeletal: Back pain Joint pain  Neurological: Negative for neurological symptoms  Psychologic: Depression Anxiety

## 2021-07-09 ENCOUNTER — Other Ambulatory Visit: Payer: Self-pay | Admitting: Physician Assistant

## 2021-07-09 DIAGNOSIS — T84018D Broken internal joint prosthesis, other site, subsequent encounter: Secondary | ICD-10-CM

## 2021-07-09 DIAGNOSIS — Z96659 Presence of unspecified artificial knee joint: Secondary | ICD-10-CM

## 2021-07-10 ENCOUNTER — Other Ambulatory Visit: Payer: Self-pay

## 2021-07-18 NOTE — Progress Notes (Signed)
Surgical Instructions    Your procedure is scheduled on 07/22/21.  Report to [facility] Main Entrance "A" at 9:00A.M., then check in with the Admitting office.  Call this number if you have problems the morning of surgery:  [phone]   If you have any questions prior to your surgery date call [phone]: Open Monday-Friday 8am-4pm    Remember:  Do not eat after midnight the night before your surgery  You may drink clear liquids until 9:00 the morning of your surgery.   Clear liquids allowed are: Water, Non-Citrus Juices (without pulp), Carbonated Beverages, Clear Tea, Black Coffee ONLY (NO MILK, CREAM OR POWDERED CREAMER of any kind), and Gatorade    Take these medicines the morning of surgery with A SIP OF WATER:  amLODipine (NORVASC) atorvastatin (LIPITOR) baclofen (LIORESAL) budesonide (PULMICORT)  carvedilol (COREG) donepezil (ARICEPT) DULoxetine (CYMBALTA) gabapentin (NEURONTIN) lamoTRIgine (LAMICTAL)  levothyroxine (SYNTHROID, LEVOTHROID) mometasone (NASONEX)  omeprazole (PRILOSEC)  rOPINIRole (REQUIP)   As Needed: acetaminophen (TYLENOL) albuterol (VENTOLIN HFA)  HYDROcodone-acetaminophen (NORCO)  ipratropium-albuterol (DUONEB)    As of today, STOP taking any Aspirin (unless otherwise instructed by your surgeon) Aleve, Naproxen, Ibuprofen, Motrin, Advil, Goody's, BC's, all herbal medications, fish oil, and all vitamins.           Do not wear jewelry or makeup Do not wear lotions, powders, perfumes  or deodorant. Do not shave 48 hours prior to surgery.   Do not bring valuables to the hospital. DO Not wear nail polish, gel polish, artificial nails, or any other type of covering on natural nails including finger and toenails. If patients have artificial nails, gel coating, etc. that need to be removed by a nail salon, please have this removed prior to surgery or surgery may need to be canceled/delayed if the surgeon/ anesthesia feels like the patient is unable  to be adequately monitored.             [facility] is not responsible for any belongings or valuables.  Do NOT Smoke (Tobacco/Vaping)  24 hours prior to your procedure  If you use a CPAP at night, you may bring your mask for your overnight stay.   Contacts, glasses, hearing aids, dentures or partials may not be worn into surgery, please bring cases for these belongings   For patients admitted to the hospital, discharge time will be determined by your treatment team.   Patients discharged the day of surgery will not be allowed to drive home, and someone needs to stay with them for 24 hours.  NO VISITORS WILL BE ALLOWED IN PRE-OP WHERE PATIENTS ARE PREPPED FOR SURGERY.  ONLY 1 SUPPORT PERSON MAY BE PRESENT IN THE WAITING ROOM WHILE YOU ARE IN SURGERY.  IF YOU ARE TO BE ADMITTED, ONCE YOU ARE IN YOUR ROOM YOU WILL BE ALLOWED TWO (2) VISITORS. 1 (ONE) VISITOR MAY STAY OVERNIGHT BUT MUST ARRIVE TO THE ROOM BY 8pm.  Minor children may have two parents present. Special consideration for safety and communication needs will be reviewed on a case by case basis.     Oral Hygiene is also important to reduce your risk of infection.  Remember - BRUSH YOUR TEETH THE MORNING OF SURGERY WITH YOUR REGULAR TOOTHPASTE   [place]- Preparing For Surgery     Day of Surgery:  Take a shower with Antibacterial soap Wear Clean/Comfortable clothing the morning of surgery Do not apply any deodorants/lotions.   Remember to brush your teeth WITH YOUR REGULAR TOOTHPASTE.     [place]

## 2021-07-21 ENCOUNTER — Encounter (HOSPITAL_COMMUNITY): Payer: Self-pay | Admitting: Physician Assistant

## 2021-07-21 ENCOUNTER — Encounter (HOSPITAL_COMMUNITY): Payer: Self-pay | Admitting: Orthopaedic Surgery

## 2021-07-21 NOTE — Progress Notes (Signed)
Patient resides at [facility] (SNF).  Information and Instructions for DOS were given to Johny Blamer @ [facility] and faxed to [phone].  DUE TO COVID-19 ONLY ONE VISITOR IS ALLOWED TO COME WITH YOU AND STAY IN THE WAITING ROOM ONLY DURING PRE OP AND PROCEDURE DAY OF SURGERY.   Two VISITORS MAY VISIT WITH YOU AFTER SURGERY IN YOUR PRIVATE ROOM DURING VISITING HOURS ONLY!  PCP - Dr Rica Koyanagi Cardiologist - Dr Loralie Champagne  Chest x-ray - 03/04/21 (1V) EKG - 04/07/21 Stress Test - 04/09/16 ECHO - 03/05/21 Cardiac Cath - 11/18/16  ICD Pacemaker/Loop - n/a  Sleep Study -  Yes CPAP - does not use cpap  Diabetes- Type 2, no meds, diet controlled  ERAS: Clear liquids til 9 am on DOS.  Clear liquids were faxed along with Instructions to the SNF.  Anesthesia review: Yes  STOP now taking any Aspirin (unless otherwise instructed by your surgeon), Aleve, Naproxen, Ibuprofen, Motrin, Advil, Goody's, BC's, all herbal medications, fish oil, and all vitamins.   Coronavirus Screening Covid test is scheduled on DOS.

## 2021-07-21 NOTE — Progress Notes (Signed)
Informed Sherry at MD's office that patient took plavix and ASA this morning.

## 2021-07-21 NOTE — Progress Notes (Signed)
Surgery cancelled by Dr Ninfa Linden.

## 2021-07-21 NOTE — Progress Notes (Signed)
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 Last seen by Dr. Aundra Dubin 04/07/21. Need for revision TKR was discussed. Per note, "Pre-operative evaluation: Patients needs redo left TKR.  From a cardiac perspective, she has been stable by echo and Cardiomems.  I think that her greater risks for surgery will be post-operative mobilization and neurological issues (she has dementia at baseline, could be worsened with anesthesia).  Also with OSA and at risk for difficult vent weaning.  From a cardiac standpoint, she would be moderate risk and can proceed with surgery. However, would involve her neurologist and likely her PCP as well in this decision-making."  Follows with neurologist Dr. Bjorn Loser at [facility] for hx of vascular dementia.  She was seen 04/03/2021 after hospitalization for altered mental status.  Upcoming surgery was also discussed.  Per note, "after review, discussion and assessment, I told her and her son that infection is a very common reason to acutely accelerate dementia symptoms.  As noted, sometimes these almost appear as if patient has had a stroke which was initial consideration but confirmed to not be the case.  Recovery following severe metabolic encephalopathy often takes quite some time and is not always complete.  I emphasized the need to follow through with her current regimen with appropriate PT/OT as she returns to prior function.  They asked about clearance for surgery on her knee as this limits her mobility severely.  I told him that dementia was often increased following lengthy anesthesia and surgery.  With understanding surgery is still appropriate to proceed because of her current limited mobility and risk of fall injury."  OSA on CPAP with supplemental O2 at night.   CKD 3 followed by nephrologist Dr. Theador Hawthorne.   Pt evidently did  not receive instruction on holding plavix, per facility pt took dose 07/21/21. Case will need to be rescheduled. Surgeon notified.    TTE 03/05/21:  1. Left ventricular ejection fraction, by estimation, is 60 to 65%. The  left ventricle has normal function. The left ventricle has no regional  wall motion abnormalities. There is mild left ventricular hypertrophy.  Left ventricular diastolic parameters  are consistent with Grade I diastolic dysfunction (impaired relaxation).   2. Right ventricular systolic function is normal. The right ventricular  size is normal.   3. The mitral valve is normal in structure. No evidence of mitral valve  regurgitation.   4. The aortic valve is tricuspid. Aortic valve regurgitation is not  visualized. Mild aortic valve sclerosis is present, with no evidence of  aortic valve stenosis.   5. The inferior vena cava is normal in size with greater than 50%  respiratory variability, suggesting right atrial pressure of 3 mmHg.  [place] 11/13/16: 1. Normal right and left heart filling pressures.  2. Successful CardioMems placement.   R/L Cath 05/25/16: 1. Normal filling pressures and cardiac output, no pulmonary hypertension.  2. No obstructive coronary artery disease.    Suspect microvascular angina.     Wynonia Musty [facility]/Anesthesiology Phone [phone] 07/21/2021 11:41 AM

## 2021-07-22 ENCOUNTER — Inpatient Hospital Stay (HOSPITAL_COMMUNITY): Admission: RE | Admit: 2021-07-22 | Payer: Medicare Other | Source: Ambulatory Visit | Admitting: Orthopaedic Surgery

## 2021-07-22 HISTORY — DX: Overactive bladder: N32.81

## 2021-07-22 SURGERY — TOTAL KNEE REVISION
Anesthesia: Spinal | Site: Knee | Laterality: Left

## 2021-08-04 ENCOUNTER — Other Ambulatory Visit: Payer: Self-pay

## 2021-08-04 ENCOUNTER — Ambulatory Visit (INDEPENDENT_AMBULATORY_CARE_PROVIDER_SITE_OTHER): Payer: Medicare Other | Admitting: Orthopaedic Surgery

## 2021-08-04 ENCOUNTER — Encounter: Payer: Self-pay | Admitting: Orthopaedic Surgery

## 2021-08-04 DIAGNOSIS — T84018D Broken internal joint prosthesis, other site, subsequent encounter: Secondary | ICD-10-CM | POA: Diagnosis not present

## 2021-08-04 DIAGNOSIS — G8929 Other chronic pain: Secondary | ICD-10-CM | POA: Diagnosis not present

## 2021-08-04 DIAGNOSIS — Z96659 Presence of unspecified artificial knee joint: Secondary | ICD-10-CM | POA: Diagnosis not present

## 2021-08-04 DIAGNOSIS — M25562 Pain in left knee: Secondary | ICD-10-CM

## 2021-08-04 NOTE — Progress Notes (Signed)
The patient comes in today to talk again about knee revision surgery as it relates to her failed left total knee arthroplasty that was done elsewhere.  She has significant comorbidities and she is a large person.  We actually had her scheduled for surgery before but her Plavix was never stopped prior to surgery.  When I had spoken to the patient and her family at the last visit prior to scheduling surgery, they stated that she was off of Plavix.  They did not realize that she was still on it.  Today I spoke in length in detail about surgery again.  She is scheduled to have her revision of the left knee on August 26, 2021.  She will stop Plavix after December 25.  I again explained how difficult her surgery will be and how there is a high complication rate as it relates to revision surgery especially with a large knee such as hers.  Her and her family still wish for [place] to proceed with surgery in light of what we have described due to her significant knee pain and limited mobility as it relates to her failed left total knee arthroplasty.  I gave notes to the nursing facility in regards to stopping her Plavix.  All questions and concerns were answered and addressed.

## 2021-08-06 ENCOUNTER — Other Ambulatory Visit: Payer: Self-pay | Admitting: Physician Assistant

## 2021-08-06 DIAGNOSIS — Z96659 Presence of unspecified artificial knee joint: Secondary | ICD-10-CM

## 2021-08-06 DIAGNOSIS — T84018D Broken internal joint prosthesis, other site, subsequent encounter: Secondary | ICD-10-CM

## 2021-08-19 ENCOUNTER — Encounter: Payer: Self-pay | Admitting: Orthopaedic Surgery

## 2021-08-21 ENCOUNTER — Encounter (HOSPITAL_COMMUNITY): Payer: Self-pay | Admitting: Vascular Surgery

## 2021-08-21 NOTE — Progress Notes (Signed)
Surgical Instructions                 Your procedure is scheduled on 08/26/21.             Report to [facility] Main Entrance "A" at 10:00A.M., then check in with the Admitting office.             Call this number if you have problems the morning of surgery:             [phone]    If you have any questions prior to your surgery date call [phone]: Open Monday-Friday 8am-4pm                 Remember:             Do not eat after midnight the night before your surgery   You may drink clear liquids until 10:00 a.m. the morning of your surgery.   Clear liquids allowed are: Water, Non-Citrus Juices (without pulp), Carbonated Beverages, Clear Tea, Black Coffee ONLY (NO MILK, CREAM OR POWDERED CREAMER of any kind), and Gatorade                          Take these medicines the morning of surgery with A SIP OF WATER: amLODipine (NORVASC) Budesonide-formoterol (SYMBICORT)  carvedilol (COREG) donepezil (ARICEPT) DULoxetine (CYMBALTA) gabapentin (NEURONTIN) lamoTRIgine (LAMICTAL)  levothyroxine (SYNTHROID, LEVOTHROID) mometasone (NASONEX) Myrbetriq omeprazole (PRILOSEC)  rOPINIRole (REQUIP)               If needed can take the following medications the morning of surgery: acetaminophen (TYLENOL) albuterol (VENTOLIN HFA) baclofen (LIORESAL)  HYDROcodone-acetaminophen (NORCO) Promethazine (Phenergan)     As of today, STOP taking any Aspirin (unless otherwise instructed by your surgeon) Aleve, Naproxen, Ibuprofen, Motrin, Advil, Goody's, BC's, all herbal medications, fish oil, and all vitamins.  Follow your surgeon's instructions on when to stop Aspirin.  If no instructions were given by your surgeon then you will need to call the office to get those instructions.    Per Dr. Ninfa Linden, STOP taking Clopidogrel (Plavix) 08/17/2021     THE DAY OF SURGERY:        Do not wear jewelry or makeup Do not wear lotions, powders, perfumes  or deodorant. Do not shave 48 hours prior to  surgery.   Do not bring valuables to the hospital. [facility] is not responsible for any belongings or valuables. DO Not wear nail polish, gel polish, artificial nails, or any other type of covering on natural nails including finger and toenails. If patients have artificial nails, gel coating, etc. that need to be removed by a nail salon, please have this removed prior to surgery or surgery may need to be canceled/delayed if the surgeon/ anesthesia feels like the patient is unable to be adequately monitored.                Do NOT Smoke (Tobacco/Vaping)  24 hours prior to your procedure   If you use a CPAP at night, you may bring your mask for your overnight stay.   Contacts, glasses, hearing aids, dentures or partials may not be worn into surgery, please bring cases for these belongings   For patients admitted to the hospital, discharge time will be determined by your treatment team.   Patients discharged the day of surgery will not be allowed to drive home, and someone needs to stay with them for 24 hours.   NO  VISITORS WILL BE ALLOWED IN PRE-OP WHERE PATIENTS ARE PREPPED FOR SURGERY.  ONLY 1 SUPPORT PERSON MAY BE PRESENT IN THE WAITING ROOM WHILE YOU ARE IN SURGERY.  IF YOU ARE TO BE ADMITTED, ONCE YOU ARE IN YOUR ROOM YOU WILL BE ALLOWED TWO (2) VISITORS. 1 (ONE) VISITOR MAY STAY OVERNIGHT BUT MUST ARRIVE TO THE ROOM BY 8pm.  Minor children may have two parents present. Special consideration for safety and communication needs will be reviewed on a case by case basis.       Oral Hygiene is also important to reduce your risk of infection.  Remember - BRUSH YOUR TEETH THE MORNING OF SURGERY WITH YOUR REGULAR TOOTHPASTE     [place]- Preparing For Surgery                                                                                                                                   Day of Surgery:   Take a shower with Antibacterial soap Wear Clean/Comfortable clothing the morning  of surgery Do not apply any deodorants/lotions.   Remember to brush your teeth WITH YOUR REGULAR TOOTHPASTE.

## 2021-08-21 NOTE — Progress Notes (Signed)
Patient resides at [facility] (SNF).  Information and Instructions for DOS were given to Johny Blamer @ [facility] and faxed to [phone].   DUE TO COVID-19 ONLY ONE VISITOR IS ALLOWED TO COME WITH YOU AND STAY IN THE WAITING ROOM ONLY DURING PRE OP AND PROCEDURE DAY OF SURGERY.    Two VISITORS MAY VISIT WITH YOU AFTER SURGERY IN YOUR PRIVATE ROOM DURING VISITING HOURS ONLY!   PCP - Dr Rica Koyanagi Cardiologist - Dr Loralie Champagne   Chest x-ray - 03/04/21 (1V) EKG - 04/07/21 Stress Test - 04/09/16 ECHO - 03/05/21 Cardiac Cath - 11/18/16   ICD Pacemaker/Loop - n/a   Sleep Study -  Yes CPAP - does not use cpap   Diabetes- Type 2, no meds, diet controlled  Plavix and ASA stopped on 08/17/2021 per Elmyra Ricks at [facility]   ERAS: Clear liquids until 10 am on DOS.  Clear liquids were faxed along with Instructions to the SNF.   Anesthesia review: Yes   STOP now taking any Aspirin (unless otherwise instructed by your surgeon), Aleve, Naproxen, Ibuprofen, Motrin, Advil, Goody's, BC's, all herbal medications, fish oil, and all vitamins.    Coronavirus Screening Covid test to be done DOS.

## 2021-08-22 ENCOUNTER — Inpatient Hospital Stay (HOSPITAL_COMMUNITY)
Admission: RE | Admit: 2021-08-22 | Discharge: 2021-08-22 | Disposition: A | Discharge status: Home / Self Care | Payer: Medicare Other | Source: Ambulatory Visit

## 2021-08-26 ENCOUNTER — Inpatient Hospital Stay (HOSPITAL_COMMUNITY): Admission: RE | Admit: 2021-08-26 | Payer: Medicare Other | Source: Ambulatory Visit | Admitting: Orthopaedic Surgery

## 2021-08-26 ENCOUNTER — Encounter (HOSPITAL_COMMUNITY): Admission: RE | Payer: Self-pay | Source: Ambulatory Visit

## 2021-08-26 SURGERY — TOTAL KNEE REVISION
Anesthesia: Spinal | Site: Knee | Laterality: Left

## 2021-09-01 ENCOUNTER — Other Ambulatory Visit: Payer: Self-pay

## 2021-09-02 ENCOUNTER — Other Ambulatory Visit: Payer: Self-pay | Admitting: Physician Assistant

## 2021-09-02 DIAGNOSIS — T84018D Broken internal joint prosthesis, other site, subsequent encounter: Secondary | ICD-10-CM

## 2021-09-10 ENCOUNTER — Encounter: Payer: Medicare Other | Admitting: Orthopaedic Surgery

## 2021-09-17 ENCOUNTER — Encounter (HOSPITAL_COMMUNITY): Payer: Self-pay | Admitting: Orthopaedic Surgery

## 2021-09-17 ENCOUNTER — Other Ambulatory Visit: Payer: Self-pay

## 2021-09-17 NOTE — Anesthesia Preprocedure Evaluation (Addendum)
Anesthesia Evaluation  Patient identified by MRN, date of birth, ID band Patient awake    Reviewed: Allergy & Precautions, Patient's Chart, lab work & pertinent test results  History of Anesthesia Complications (+) PONV and history of anesthetic complications  Airway Mallampati: II  TM Distance: >3 FB Neck ROM: Full    Dental no notable dental hx. (+) Edentulous Upper, Dental Advisory Given   Pulmonary shortness of breath, sleep apnea and Continuous Positive Airway Pressure Ventilation ,    Pulmonary exam normal breath sounds clear to auscultation       Cardiovascular hypertension, Pt. on medications + angina +CHF  (-) CAD and (-) Cardiac Stents Normal cardiovascular exam Rhythm:Regular Rate:Normal  IMPRESSIONS    1. Left ventricular ejection fraction, by estimation, is 60 to 65%. The  left ventricle has normal function. The left ventricle has no regional  wall motion abnormalities. There is mild left ventricular hypertrophy.  Left ventricular diastolic parameters  are consistent with Grade I diastolic dysfunction (impaired relaxation).  2. Right ventricular systolic function is normal. The right ventricular  size is normal.  3. The mitral valve is normal in structure. No evidence of mitral valve  regurgitation.  4. The aortic valve is tricuspid. Aortic valve regurgitation is not  visualized. Mild aortic valve sclerosis is present, with no evidence of  aortic valve stenosis.  5. The inferior vena cava is normal in size with greater than 50%  respiratory variability, suggesting right atrial pressure of 3 mmHg.    Neuro/Psych  Headaches, PSYCHIATRIC DISORDERS Anxiety Depression Dementia TIA   GI/Hepatic Neg liver ROS, GERD  Medicated and Controlled,  Endo/Other  diabetes, Type 2Hypothyroidism   Renal/GU Renal InsufficiencyRenal disease  negative genitourinary   Musculoskeletal  (+) Arthritis , Fibromyalgia -   Abdominal   Peds negative pediatric ROS (+)  Hematology  (+) Blood dyscrasia, anemia ,   Anesthesia Other Findings   Reproductive/Obstetrics negative OB ROS                            Anesthesia Physical  Anesthesia Plan  ASA: 3  Anesthesia Plan:    Post-op Pain Management: GA combined w/ Regional for post-op pain and Tylenol PO (pre-op) and Regional block   Induction: Intravenous  PONV Risk Score and Plan: 4 or greater and Ondansetron, Dexamethasone and Treatment may vary due to age or medical condition  Airway Management Planned:   Additional Equipment:   Intra-op Plan:   Post-operative Plan:   Informed Consent: I have reviewed the patients History and Physical, chart, labs and discussed the procedure including the risks, benefits and alternatives for the proposed anesthesia with the patient or authorized representative who has indicated his/her understanding and acceptance.     Dental advisory given  Plan Discussed with: Anesthesiologist and CRNA  Anesthesia Plan Comments:       Anesthesia Quick Evaluation

## 2021-09-17 NOTE — Progress Notes (Signed)
Tonia Ghent RN from [facility] confirmed receipt of the pre-op instructions.

## 2021-09-17 NOTE — Progress Notes (Signed)
Faxed pre-op instructions to Tonia Ghent., RN with [facility] ph: [phone], fx: [phone]. Awaiting receipt confirmation.

## 2021-09-17 NOTE — Pre-Procedure Instructions (Signed)
[name]  09/17/2021      Surgical Instructions   Your procedure is scheduled on Thurs., Jan. 26, 2023 from 7:30AM-1000AM.  Report to [facility] Main Entrance "A" at 5:30 A.M., then check in with the Admitting office.  Call this number if you have problems the morning of surgery:  [phone]   Remember:  Do not eat after midnight the night before your surgery  You may drink clear liquids until 3 hours (4:30AM) prior to surgery time, the morning of your surgery.   Clear liquids allowed are: Water, Non-Citrus Juices (without pulp), Carbonated Beverages, Clear Tea, Black Coffee ONLY (NO MILK, CREAM OR POWDERED CREAMER of any kind), and Gatorade    Take these medicines the morning of surgery with A SIP OF WATER: Please provide exact times the medication was given for anesthesia documentation.  AmLODipine (NORVASC) Carvedilol (COREG) DULoxetine (CYMBALTA) Gabapentin (NEURONTIN) LamoTRIgine (LAMICTAL) Levothyroxine (SYNTHROID, LEVOTHROID) Mometasone (NASONEX)  MYRBETRIQ  Omeprazole (PRILOSEC) ROPINIRole (REQUIP)  As Needed: OXYGEN  Follow Dr. Trevor Mace instructions on when to stop Aspirin and Plavix.  If no instructions were given by your surgeon then you will need to call the office to get those instructions.    As of today, STOP taking any Aspirin (unless otherwise instructed by your surgeon) Aleve, Naproxen, Ibuprofen, Motrin, Advil, Goody's, BC's, all herbal medications, fish oil, and all vitamins.  How to Manage Your Diabetes Before and After Surgery  How do I manage my blood sugar before surgery? Check your blood sugar the morning of your surgery when you wake up and every 2 hours until you get to the Short Stay unit. If your blood sugar is less than 70 mg/dL, you will need to treat for low blood sugar: Do not take insulin. Treat a low blood sugar (less than 70 mg/dL) with  cup of clear juice (cranberry or apple), 4 glucose tablets, OR glucose  gel. Recheck blood sugar in 15 minutes after treatment (to make sure it is greater than 70 mg/dL). If your blood sugar is not greater than 70 mg/dL on recheck, call [phone]  for further instructions. Report your blood sugar to the short stay nurse when you get to Short Stay.  If you are admitted to the hospital after surgery: Your blood sugar will be checked by the staff and you will probably be given insulin after surgery (instead of oral diabetes medicines) to make sure you have good blood sugar levels. The goal for blood sugar control after surgery is 80-180 mg/dL.  If your CBG is greater than 220 mg/dL, you may take  of your sliding scale (correction) dose of insulin.   Reviewed and Endorsed by [facility] Patient Education Committee, August 2015  Prior to [facility] your provider: if you are in close contact with someone who has COVID  or if you develop a fever of 100.4 or greater, sneezing, cough, sore throat, shortness of breath or body aches.             Day of Surgery: Do not wear jewelry or makeup Do not wear lotions, powders, perfumes/colognes, or deodorant. Do not shave 48 hours prior to surgery.   Do not bring valuables to the hospital. DO Not wear nail polish, gel polish, artificial nails, or any other type of covering on natural nails (fingers and toes) If you have artificial nails or gel coating that need to be removed by a nail salon, please have this removed prior to surgery.  Artificial nails or gel coating may interfere with anesthesia's ability to adequately monitor your vital signs.             [facility] is not responsible for any belongings or valuables.  Do NOT Smoke (Tobacco/Vaping)  24 hours prior to your procedure  If you use a CPAP at night, you may bring your mask and machine for your overnight stay.   Contacts, glasses, hearing aids, dentures or partials may not be worn into surgery, please bring cases for these belongings   For  patients admitted to the hospital, discharge time will be determined by your treatment team.   Patients discharged the day of surgery will not be allowed to drive home, and someone needs to stay with them for 24 hours.  NO VISITORS WILL BE ALLOWED IN PRE-OP WHERE PATIENTS ARE PREPPED FOR SURGERY.  ONLY 1 SUPPORT PERSON MAY BE PRESENT IN THE WAITING ROOM WHILE YOU ARE IN SURGERY.  IF YOU ARE TO BE ADMITTED, ONCE YOU ARE IN YOUR ROOM YOU WILL BE ALLOWED TWO (2) VISITORS. 1 (ONE) VISITOR MAY STAY OVERNIGHT BUT MUST ARRIVE TO THE ROOM BY 8pm.  Minor children may have two parents present. Special consideration for safety and communication needs will be reviewed on a case by case basis.  Special instructions:    Oral Hygiene is also important to reduce your risk of infection.  Remember - BRUSH YOUR TEETH THE MORNING OF SURGERY WITH YOUR REGULAR TOOTHPASTE  - Preparing For Surgery  Before surgery, you can play an important role. Because skin is not sterile, your skin needs to be as free of germs as possible. You can reduce the number of germs on your skin by washing with an Antibacterial Soap or Regular Soap  before surgery.    Please follow these instructions carefully.    Shower the NIGHT BEFORE SURGERY and the MORNING OF SURGERY with an Antibacterial or Regular Soap.   Wear CLEAN PAJAMAS to bed the night before surgery  Place CLEAN SHEETS on your bed the night before your surgery  DO NOT SLEEP WITH PETS.  Reminder: Take a shower with CHG soap. Wear Clean/Comfortable clothing the morning of surgery Do not apply any deodorants/lotions.   Remember to brush your teeth WITH YOUR REGULAR TOOTHPASTE.   Please read over the following fact sheets that you were given.

## 2021-09-17 NOTE — Progress Notes (Signed)
PCP - Dr. Barbara Cower  Cardiologist - Dr. Aundra Dubin  EP- Denies  Endocrine- Denies   Pulm- Denies  Chest x-ray - 03/04/21 (E)  EKG - 04/07/21 (E)  Stress Test - 04/08/16 (E)  ECHO - 03/05/21 (E)  Cardiac Cath - 11/13/16 (E)  AICD- na PM-na LOOP-na  Dialysis- Denies  Sleep Study - Yes- Positive CPAP - Yes  LABS- 09/18/21: CBC, BMP, COVID, PCR, T/S  ASA- LD- 09/10/21, per Brandt Loosen, RN- [facility] Plavix- LD- 09/10/21  ERAS- Yes, clears until 0430  HA1C- unknown Fasting Blood Sugar -  Checks Blood Sugar ___0__ times a day, Per Decarla, they do not check blood sugars on the pt.  Anesthesia-Yes- previous consult  Decarla W., RN  denies the pt having chest pain, sob, or fever during the pre-op phone call. All instructions faxed to Tonia Ghent., RN with [facility].The opportunity to ask questions was provided.    Coronavirus Screening  Have you experienced the following symptoms:  Cough yes/no: No Fever (>100.2F)  yes/no: No Runny nose yes/no: No Sore throat yes/no: No Difficulty breathing/shortness of breath  yes/no: No  Have you or a family member traveled in the last 14 days and where? yes/no: No   If the patient indicates "YES" to the above questions, their PAT will be rescheduled to limit the exposure to others and, the surgeon will be notified. THE PATIENT WILL NEED TO BE ASYMPTOMATIC FOR 14 DAYS.   If the patient is not experiencing any of these symptoms, the PAT nurse will instruct them to NOT bring anyone with them to their appointment since they may have these symptoms or traveled as well.   Please remind your patients and families that hospital visitation restrictions are in effect and the importance of the restrictions.

## 2021-09-18 ENCOUNTER — Encounter (HOSPITAL_COMMUNITY): Payer: Self-pay | Admitting: Orthopaedic Surgery

## 2021-09-18 ENCOUNTER — Other Ambulatory Visit: Payer: Self-pay

## 2021-09-18 ENCOUNTER — Inpatient Hospital Stay (HOSPITAL_COMMUNITY): Payer: Medicare Other | Admitting: Certified Registered"

## 2021-09-18 ENCOUNTER — Inpatient Hospital Stay (HOSPITAL_COMMUNITY): Payer: Medicare Other

## 2021-09-18 ENCOUNTER — Encounter (HOSPITAL_COMMUNITY)
Admission: RE | Disposition: A | Discharge status: Skilled Nursing Facility | Payer: Self-pay | Source: Home / Self Care | Attending: Orthopaedic Surgery

## 2021-09-18 ENCOUNTER — Inpatient Hospital Stay (HOSPITAL_COMMUNITY)
Admission: RE | Admit: 2021-09-18 | Discharge: 2021-09-22 | DRG: 467 | Disposition: A | Discharge status: Skilled Nursing Facility | Payer: Medicare Other | Attending: Orthopaedic Surgery | Admitting: Orthopaedic Surgery

## 2021-09-18 DIAGNOSIS — M797 Fibromyalgia: Secondary | ICD-10-CM | POA: Diagnosis present

## 2021-09-18 DIAGNOSIS — Z801 Family history of malignant neoplasm of trachea, bronchus and lung: Secondary | ICD-10-CM | POA: Diagnosis not present

## 2021-09-18 DIAGNOSIS — T84093D Other mechanical complication of internal left knee prosthesis, subsequent encounter: Secondary | ICD-10-CM

## 2021-09-18 DIAGNOSIS — E1122 Type 2 diabetes mellitus with diabetic chronic kidney disease: Secondary | ICD-10-CM | POA: Diagnosis present

## 2021-09-18 DIAGNOSIS — D62 Acute posthemorrhagic anemia: Secondary | ICD-10-CM | POA: Diagnosis not present

## 2021-09-18 DIAGNOSIS — Z8673 Personal history of transient ischemic attack (TIA), and cerebral infarction without residual deficits: Secondary | ICD-10-CM | POA: Diagnosis not present

## 2021-09-18 DIAGNOSIS — Y792 Prosthetic and other implants, materials and accessory orthopedic devices associated with adverse incidents: Secondary | ICD-10-CM | POA: Diagnosis present

## 2021-09-18 DIAGNOSIS — G4733 Obstructive sleep apnea (adult) (pediatric): Secondary | ICD-10-CM | POA: Diagnosis present

## 2021-09-18 DIAGNOSIS — N1831 Chronic kidney disease, stage 3a: Secondary | ICD-10-CM | POA: Diagnosis present

## 2021-09-18 DIAGNOSIS — G2581 Restless legs syndrome: Secondary | ICD-10-CM | POA: Diagnosis present

## 2021-09-18 DIAGNOSIS — Z96611 Presence of right artificial shoulder joint: Secondary | ICD-10-CM | POA: Diagnosis present

## 2021-09-18 DIAGNOSIS — Z8041 Family history of malignant neoplasm of ovary: Secondary | ICD-10-CM

## 2021-09-18 DIAGNOSIS — Z96659 Presence of unspecified artificial knee joint: Secondary | ICD-10-CM

## 2021-09-18 DIAGNOSIS — Z96652 Presence of left artificial knee joint: Secondary | ICD-10-CM | POA: Diagnosis not present

## 2021-09-18 DIAGNOSIS — E785 Hyperlipidemia, unspecified: Secondary | ICD-10-CM | POA: Diagnosis present

## 2021-09-18 DIAGNOSIS — M069 Rheumatoid arthritis, unspecified: Secondary | ICD-10-CM | POA: Diagnosis present

## 2021-09-18 DIAGNOSIS — Z8249 Family history of ischemic heart disease and other diseases of the circulatory system: Secondary | ICD-10-CM

## 2021-09-18 DIAGNOSIS — T84018D Broken internal joint prosthesis, other site, subsequent encounter: Secondary | ICD-10-CM

## 2021-09-18 DIAGNOSIS — F0390 Unspecified dementia without behavioral disturbance: Secondary | ICD-10-CM | POA: Diagnosis present

## 2021-09-18 DIAGNOSIS — Z96612 Presence of left artificial shoulder joint: Secondary | ICD-10-CM | POA: Diagnosis present

## 2021-09-18 DIAGNOSIS — J449 Chronic obstructive pulmonary disease, unspecified: Secondary | ICD-10-CM | POA: Diagnosis present

## 2021-09-18 DIAGNOSIS — I5032 Chronic diastolic (congestive) heart failure: Secondary | ICD-10-CM | POA: Diagnosis present

## 2021-09-18 DIAGNOSIS — Z20822 Contact with and (suspected) exposure to covid-19: Secondary | ICD-10-CM | POA: Diagnosis present

## 2021-09-18 DIAGNOSIS — E039 Hypothyroidism, unspecified: Secondary | ICD-10-CM | POA: Diagnosis present

## 2021-09-18 DIAGNOSIS — Z841 Family history of disorders of kidney and ureter: Secondary | ICD-10-CM

## 2021-09-18 DIAGNOSIS — I130 Hypertensive heart and chronic kidney disease with heart failure and stage 1 through stage 4 chronic kidney disease, or unspecified chronic kidney disease: Secondary | ICD-10-CM | POA: Diagnosis present

## 2021-09-18 DIAGNOSIS — T84033A Mechanical loosening of internal left knee prosthetic joint, initial encounter: Secondary | ICD-10-CM | POA: Diagnosis present

## 2021-09-18 DIAGNOSIS — K219 Gastro-esophageal reflux disease without esophagitis: Secondary | ICD-10-CM | POA: Diagnosis present

## 2021-09-18 DIAGNOSIS — Z96651 Presence of right artificial knee joint: Secondary | ICD-10-CM | POA: Diagnosis present

## 2021-09-18 DIAGNOSIS — M4712 Other spondylosis with myelopathy, cervical region: Secondary | ICD-10-CM | POA: Diagnosis present

## 2021-09-18 DIAGNOSIS — Z8049 Family history of malignant neoplasm of other genital organs: Secondary | ICD-10-CM

## 2021-09-18 DIAGNOSIS — Z800 Family history of malignant neoplasm of digestive organs: Secondary | ICD-10-CM

## 2021-09-18 HISTORY — PX: TOTAL KNEE REVISION: SHX996

## 2021-09-18 LAB — BASIC METABOLIC PANEL
Anion gap: 10 (ref 5–15)
BUN: 26 mg/dL — ABNORMAL HIGH (ref 8–23)
CO2: 29 mmol/L (ref 22–32)
Calcium: 10.5 mg/dL — ABNORMAL HIGH (ref 8.9–10.3)
Chloride: 102 mmol/L (ref 98–111)
Creatinine, Ser: 1.18 mg/dL — ABNORMAL HIGH (ref 0.44–1.00)
GFR, Estimated: 48 mL/min — ABNORMAL LOW (ref >60)
Glucose, Bld: 100 mg/dL — ABNORMAL HIGH (ref 70–99)
Potassium: 3.9 mmol/L (ref 3.5–5.1)
Sodium: 141 mmol/L (ref 135–145)

## 2021-09-18 LAB — GLUCOSE, CAPILLARY
Glucose-Capillary: 102 mg/dL — ABNORMAL HIGH (ref 70–99)
Glucose-Capillary: 91 mg/dL (ref 70–99)

## 2021-09-18 LAB — CBC
HCT: 34 % — ABNORMAL LOW (ref 36.0–46.0)
Hemoglobin: 10.7 g/dL — ABNORMAL LOW (ref 12.0–15.0)
MCH: 30.5 pg (ref 26.0–34.0)
MCHC: 31.5 g/dL (ref 30.0–36.0)
MCV: 96.9 fL (ref 80.0–100.0)
Platelets: 253 10*3/uL (ref 150–400)
RBC: 3.51 MIL/uL — ABNORMAL LOW (ref 3.87–5.11)
RDW: 14.8 % (ref 11.5–15.5)
WBC: 9 10*3/uL (ref 4.0–10.5)
nRBC: 0 % (ref 0.0–0.2)

## 2021-09-18 LAB — TYPE AND SCREEN
ABO/RH(D): O NEG
Antibody Screen: NEGATIVE

## 2021-09-18 LAB — SURGICAL PCR SCREEN
MRSA, PCR: NEGATIVE
Staphylococcus aureus: NEGATIVE

## 2021-09-18 LAB — SARS CORONAVIRUS 2 BY RT PCR (HOSPITAL ORDER, PERFORMED IN ~~LOC~~ HOSPITAL LAB): SARS Coronavirus 2: NEGATIVE

## 2021-09-18 SURGERY — TOTAL KNEE REVISION
Anesthesia: Spinal | Site: Knee | Laterality: Left

## 2021-09-18 MED ORDER — EPHEDRINE SULFATE-NACL 50-0.9 MG/10ML-% IV SOSY
PREFILLED_SYRINGE | INTRAVENOUS | Status: DC | PRN
Start: 2021-09-18 — End: 2021-09-18
  Administered 2021-09-18 (×2): 5 mg via INTRAVENOUS
  Administered 2021-09-18: 10 mg via INTRAVENOUS
  Administered 2021-09-18: 5 mg via INTRAVENOUS

## 2021-09-18 MED ORDER — TRANEXAMIC ACID-NACL 1000-0.7 MG/100ML-% IV SOLN
1000.0000 mg | INTRAVENOUS | Status: AC
  Administered 2021-09-18: 1000 mg via INTRAVENOUS
  Filled 2021-09-18: qty 100

## 2021-09-18 MED ORDER — GABAPENTIN 400 MG PO CAPS
800.0000 mg | ORAL_CAPSULE | Freq: Three times a day (TID) | ORAL | Status: DC
  Administered 2021-09-18 – 2021-09-22 (×12): 800 mg via ORAL
  Filled 2021-09-18 (×12): qty 2

## 2021-09-18 MED ORDER — ASPIRIN EC 81 MG PO TBEC
81.0000 mg | DELAYED_RELEASE_TABLET | Freq: Every day | ORAL | Status: DC
  Administered 2021-09-19 – 2021-09-22 (×4): 81 mg via ORAL
  Filled 2021-09-18 (×4): qty 1

## 2021-09-18 MED ORDER — DONEPEZIL HCL 10 MG PO TABS
20.0000 mg | ORAL_TABLET | Freq: Every day | ORAL | Status: DC
  Administered 2021-09-18 – 2021-09-21 (×4): 20 mg via ORAL
  Filled 2021-09-18 (×4): qty 2

## 2021-09-18 MED ORDER — LAMOTRIGINE 100 MG PO TABS
100.0000 mg | ORAL_TABLET | Freq: Two times a day (BID) | ORAL | Status: DC
  Administered 2021-09-18 – 2021-09-22 (×8): 100 mg via ORAL
  Filled 2021-09-18 (×8): qty 1

## 2021-09-18 MED ORDER — MIDAZOLAM HCL 5 MG/5ML IJ SOLN
INTRAMUSCULAR | Status: DC | PRN
  Administered 2021-09-18: 1 mg via INTRAVENOUS

## 2021-09-18 MED ORDER — VENLAFAXINE HCL ER 75 MG PO CP24
75.0000 mg | ORAL_CAPSULE | Freq: Every day | ORAL | Status: DC
  Administered 2021-09-19 – 2021-09-22 (×4): 75 mg via ORAL
  Filled 2021-09-18 (×4): qty 1

## 2021-09-18 MED ORDER — PHENYLEPHRINE 40 MCG/ML (10ML) SYRINGE FOR IV PUSH (FOR BLOOD PRESSURE SUPPORT)
PREFILLED_SYRINGE | INTRAVENOUS | Status: AC
  Filled 2021-09-18: qty 10

## 2021-09-18 MED ORDER — PHENYLEPHRINE HCL (PRESSORS) 10 MG/ML IV SOLN
INTRAVENOUS | Status: AC
  Filled 2021-09-18: qty 1

## 2021-09-18 MED ORDER — DOCUSATE SODIUM 100 MG PO CAPS
100.0000 mg | ORAL_CAPSULE | Freq: Two times a day (BID) | ORAL | Status: DC
  Administered 2021-09-18 – 2021-09-22 (×8): 100 mg via ORAL
  Filled 2021-09-18 (×8): qty 1

## 2021-09-18 MED ORDER — PHENOL 1.4 % MT LIQD: 1.0000 | OROMUCOSAL | Status: DC | PRN

## 2021-09-18 MED ORDER — BACLOFEN 10 MG PO TABS
10.0000 mg | ORAL_TABLET | Freq: Three times a day (TID) | ORAL | Status: DC | PRN
  Administered 2021-09-18 – 2021-09-21 (×4): 10 mg via ORAL
  Filled 2021-09-18 (×4): qty 1

## 2021-09-18 MED ORDER — ORAL CARE MOUTH RINSE: 15.0000 mL | Freq: Once | OROMUCOSAL | Status: AC

## 2021-09-18 MED ORDER — CLOPIDOGREL BISULFATE 75 MG PO TABS
75.0000 mg | ORAL_TABLET | Freq: Every day | ORAL | Status: DC
Start: 2021-09-19 — End: 2021-09-22
  Administered 2021-09-19 – 2021-09-22 (×4): 75 mg via ORAL
  Filled 2021-09-18 (×4): qty 1

## 2021-09-18 MED ORDER — METOCLOPRAMIDE HCL 5 MG PO TABS
5.0000 mg | ORAL_TABLET | Freq: Three times a day (TID) | ORAL | Status: DC | PRN
  Filled 2021-09-18: qty 2

## 2021-09-18 MED ORDER — 0.9 % SODIUM CHLORIDE (POUR BTL) OPTIME
TOPICAL | Status: DC | PRN
  Administered 2021-09-18: 1000 mL

## 2021-09-18 MED ORDER — PANTOPRAZOLE SODIUM 40 MG PO TBEC
40.0000 mg | DELAYED_RELEASE_TABLET | Freq: Every day | ORAL | Status: DC
  Administered 2021-09-19 – 2021-09-22 (×4): 40 mg via ORAL
  Filled 2021-09-18 (×4): qty 1

## 2021-09-18 MED ORDER — ONDANSETRON HCL 4 MG PO TABS
4.0000 mg | ORAL_TABLET | Freq: Four times a day (QID) | ORAL | Status: DC | PRN
  Filled 2021-09-18: qty 1

## 2021-09-18 MED ORDER — DIPHENHYDRAMINE HCL 12.5 MG/5ML PO ELIX: 12.5000 mg | ORAL_SOLUTION | ORAL | Status: DC | PRN

## 2021-09-18 MED ORDER — FENTANYL CITRATE (PF) 100 MCG/2ML IJ SOLN
INTRAMUSCULAR | Status: AC
  Filled 2021-09-18: qty 2

## 2021-09-18 MED ORDER — PHENYLEPHRINE HCL-NACL 20-0.9 MG/250ML-% IV SOLN
INTRAVENOUS | Status: DC | PRN
  Administered 2021-09-18: 40 ug/min via INTRAVENOUS

## 2021-09-18 MED ORDER — AMLODIPINE BESYLATE 2.5 MG PO TABS
2.5000 mg | ORAL_TABLET | Freq: Every day | ORAL | Status: DC
  Administered 2021-09-18 – 2021-09-22 (×5): 2.5 mg via ORAL
  Filled 2021-09-18 (×5): qty 1

## 2021-09-18 MED ORDER — LEVOTHYROXINE SODIUM 88 MCG PO TABS
88.0000 ug | ORAL_TABLET | Freq: Every day | ORAL | Status: DC
  Administered 2021-09-19 – 2021-09-22 (×4): 88 ug via ORAL
  Filled 2021-09-18 (×4): qty 1

## 2021-09-18 MED ORDER — BUPIVACAINE IN DEXTROSE 0.75-8.25 % IT SOLN
INTRATHECAL | Status: DC | PRN
  Administered 2021-09-18: 1.8 mL via INTRATHECAL

## 2021-09-18 MED ORDER — MIRABEGRON ER 50 MG PO TB24
50.0000 mg | ORAL_TABLET | Freq: Every day | ORAL | Status: DC
  Administered 2021-09-19 – 2021-09-22 (×4): 50 mg via ORAL
  Filled 2021-09-18 (×4): qty 1

## 2021-09-18 MED ORDER — PROMETHAZINE HCL 25 MG/ML IJ SOLN: 6.2500 mg | INTRAMUSCULAR | Status: DC | PRN

## 2021-09-18 MED ORDER — ALBUTEROL SULFATE (2.5 MG/3ML) 0.083% IN NEBU: 3.0000 mL | INHALATION_SOLUTION | RESPIRATORY_TRACT | Status: DC | PRN

## 2021-09-18 MED ORDER — FENTANYL CITRATE (PF) 100 MCG/2ML IJ SOLN: 25.0000 ug | INTRAMUSCULAR | Status: DC | PRN

## 2021-09-18 MED ORDER — MENTHOL 3 MG MT LOZG: 1.0000 | LOZENGE | OROMUCOSAL | Status: DC | PRN

## 2021-09-18 MED ORDER — EPHEDRINE 5 MG/ML INJ
INTRAVENOUS | Status: AC
  Filled 2021-09-18: qty 5

## 2021-09-18 MED ORDER — ATORVASTATIN CALCIUM 40 MG PO TABS
40.0000 mg | ORAL_TABLET | Freq: Every evening | ORAL | Status: DC
  Administered 2021-09-18 – 2021-09-21 (×4): 40 mg via ORAL
  Filled 2021-09-18 (×4): qty 1

## 2021-09-18 MED ORDER — ALBUMIN HUMAN 5 % IV SOLN
INTRAVENOUS | Status: DC | PRN
Start: 2021-09-18 — End: 2021-09-18

## 2021-09-18 MED ORDER — MIDAZOLAM HCL 2 MG/2ML IJ SOLN
INTRAMUSCULAR | Status: AC
  Filled 2021-09-18: qty 2

## 2021-09-18 MED ORDER — TORSEMIDE 20 MG PO TABS
80.0000 mg | ORAL_TABLET | Freq: Every day | ORAL | Status: DC
  Administered 2021-09-18 – 2021-09-22 (×5): 80 mg via ORAL
  Filled 2021-09-18 (×5): qty 4

## 2021-09-18 MED ORDER — ROPINIROLE HCL 1 MG PO TABS
3.0000 mg | ORAL_TABLET | Freq: Four times a day (QID) | ORAL | Status: DC
  Administered 2021-09-18 – 2021-09-22 (×14): 3 mg via ORAL
  Filled 2021-09-18: qty 3
  Filled 2021-09-18 (×2): qty 6
  Filled 2021-09-18 (×12): qty 3

## 2021-09-18 MED ORDER — CHLORHEXIDINE GLUCONATE 0.12 % MT SOLN
15.0000 mL | Freq: Once | OROMUCOSAL | Status: AC
  Administered 2021-09-18: 15 mL via OROMUCOSAL
  Filled 2021-09-18: qty 15

## 2021-09-18 MED ORDER — FENTANYL CITRATE (PF) 100 MCG/2ML IJ SOLN
INTRAMUSCULAR | Status: DC | PRN
  Administered 2021-09-18 (×2): 25 ug via INTRAVENOUS
  Administered 2021-09-18: 50 ug via INTRAVENOUS

## 2021-09-18 MED ORDER — ALUM & MAG HYDROXIDE-SIMETH 200-200-20 MG/5ML PO SUSP: 30.0000 mL | ORAL | Status: DC | PRN

## 2021-09-18 MED ORDER — AMISULPRIDE (ANTIEMETIC) 5 MG/2ML IV SOLN: 10.0000 mg | Freq: Once | INTRAVENOUS | Status: DC | PRN

## 2021-09-18 MED ORDER — CARVEDILOL 3.125 MG PO TABS
ORAL_TABLET | ORAL | Status: AC
  Administered 2021-09-18: 6.25 mg via ORAL
  Filled 2021-09-18: qty 2

## 2021-09-18 MED ORDER — ONDANSETRON HCL 4 MG/2ML IJ SOLN: 4.0000 mg | Freq: Four times a day (QID) | INTRAMUSCULAR | Status: DC | PRN

## 2021-09-18 MED ORDER — OXYCODONE HCL 5 MG PO TABS
5.0000 mg | ORAL_TABLET | ORAL | Status: DC | PRN
  Filled 2021-09-18 (×2): qty 2

## 2021-09-18 MED ORDER — POVIDONE-IODINE 10 % EX SWAB
2.0000 "application " | Freq: Once | CUTANEOUS | Status: AC
  Administered 2021-09-18: 2 via TOPICAL

## 2021-09-18 MED ORDER — CARVEDILOL 6.25 MG PO TABS
6.2500 mg | ORAL_TABLET | Freq: Two times a day (BID) | ORAL | Status: DC
  Administered 2021-09-18 – 2021-09-22 (×8): 6.25 mg via ORAL
  Filled 2021-09-18 (×8): qty 1

## 2021-09-18 MED ORDER — CEFAZOLIN SODIUM-DEXTROSE 2-4 GM/100ML-% IV SOLN
2.0000 g | INTRAVENOUS | Status: AC
  Administered 2021-09-18: 2 g via INTRAVENOUS
  Filled 2021-09-18: qty 100

## 2021-09-18 MED ORDER — ACETAMINOPHEN 500 MG PO TABS
1000.0000 mg | ORAL_TABLET | Freq: Once | ORAL | Status: AC
  Administered 2021-09-18: 1000 mg via ORAL
  Filled 2021-09-18: qty 2

## 2021-09-18 MED ORDER — CEFAZOLIN SODIUM-DEXTROSE 1-4 GM/50ML-% IV SOLN
1.0000 g | Freq: Four times a day (QID) | INTRAVENOUS | Status: AC
  Administered 2021-09-18 (×2): 1 g via INTRAVENOUS
  Filled 2021-09-18 (×2): qty 50

## 2021-09-18 MED ORDER — VITAMIN D 25 MCG (1000 UNIT) PO TABS
2000.0000 [IU] | ORAL_TABLET | Freq: Every day | ORAL | Status: DC
  Administered 2021-09-19 – 2021-09-22 (×4): 2000 [IU] via ORAL
  Filled 2021-09-18 (×4): qty 2

## 2021-09-18 MED ORDER — SODIUM CHLORIDE 0.9 % IV SOLN: INTRAVENOUS | Status: DC

## 2021-09-18 MED ORDER — PHENYLEPHRINE 40 MCG/ML (10ML) SYRINGE FOR IV PUSH (FOR BLOOD PRESSURE SUPPORT)
PREFILLED_SYRINGE | INTRAVENOUS | Status: DC | PRN
Start: 2021-09-18 — End: 2021-09-18
  Administered 2021-09-18: 40 ug via INTRAVENOUS
  Administered 2021-09-18 (×3): 80 ug via INTRAVENOUS
  Administered 2021-09-18: 40 ug via INTRAVENOUS
  Administered 2021-09-18: 80 ug via INTRAVENOUS

## 2021-09-18 MED ORDER — OXYCODONE HCL 5 MG PO TABS
10.0000 mg | ORAL_TABLET | ORAL | Status: DC | PRN
  Administered 2021-09-18 – 2021-09-22 (×12): 15 mg via ORAL
  Filled 2021-09-18 (×13): qty 3

## 2021-09-18 MED ORDER — POLYETHYLENE GLYCOL 3350 17 G PO PACK
17.0000 g | PACK | Freq: Every day | ORAL | Status: DC
  Administered 2021-09-19 – 2021-09-22 (×4): 17 g via ORAL
  Filled 2021-09-18 (×4): qty 1

## 2021-09-18 MED ORDER — PROPOFOL 1000 MG/100ML IV EMUL
INTRAVENOUS | Status: AC
  Filled 2021-09-18: qty 100

## 2021-09-18 MED ORDER — POTASSIUM CHLORIDE CRYS ER 20 MEQ PO TBCR
20.0000 meq | EXTENDED_RELEASE_TABLET | ORAL | Status: DC
  Administered 2021-09-19 – 2021-09-21 (×2): 20 meq via ORAL
  Filled 2021-09-18 (×2): qty 1

## 2021-09-18 MED ORDER — METOCLOPRAMIDE HCL 5 MG/ML IJ SOLN: 5.0000 mg | Freq: Three times a day (TID) | INTRAMUSCULAR | Status: DC | PRN

## 2021-09-18 MED ORDER — PROPOFOL 500 MG/50ML IV EMUL
INTRAVENOUS | Status: DC | PRN
  Administered 2021-09-18: 75 ug/kg/min via INTRAVENOUS

## 2021-09-18 MED ORDER — CARVEDILOL 12.5 MG PO TABS: 6.2500 mg | ORAL_TABLET | Freq: Once | ORAL | Status: AC

## 2021-09-18 MED ORDER — LACTATED RINGERS IV SOLN: INTRAVENOUS | Status: DC

## 2021-09-18 MED ORDER — ASCORBIC ACID 500 MG PO TABS
1000.0000 mg | ORAL_TABLET | Freq: Every day | ORAL | Status: DC
  Administered 2021-09-18 – 2021-09-22 (×5): 1000 mg via ORAL
  Filled 2021-09-18 (×5): qty 2

## 2021-09-18 MED ORDER — PROPOFOL 10 MG/ML IV BOLUS
INTRAVENOUS | Status: AC
  Filled 2021-09-18: qty 20

## 2021-09-18 MED ORDER — HYDROMORPHONE HCL 1 MG/ML IJ SOLN
0.5000 mg | INTRAMUSCULAR | Status: DC | PRN
  Administered 2021-09-18 – 2021-09-22 (×15): 1 mg via INTRAVENOUS
  Filled 2021-09-18 (×15): qty 1

## 2021-09-18 MED ORDER — DULOXETINE HCL 30 MG PO CPEP
30.0000 mg | ORAL_CAPSULE | Freq: Every day | ORAL | Status: DC
  Administered 2021-09-19 – 2021-09-22 (×4): 30 mg via ORAL
  Filled 2021-09-18 (×4): qty 1

## 2021-09-18 MED ORDER — ACETAMINOPHEN 325 MG PO TABS
325.0000 mg | ORAL_TABLET | Freq: Four times a day (QID) | ORAL | Status: DC | PRN
  Administered 2021-09-19 – 2021-09-21 (×5): 650 mg via ORAL
  Administered 2021-09-21: 325 mg via ORAL
  Filled 2021-09-18 (×6): qty 2

## 2021-09-18 MED ORDER — MOMETASONE FURO-FORMOTEROL FUM 200-5 MCG/ACT IN AERO: 2.0000 | INHALATION_SPRAY | Freq: Two times a day (BID) | RESPIRATORY_TRACT | Status: DC

## 2021-09-18 MED ORDER — SODIUM CHLORIDE 0.9 % IR SOLN
Status: DC | PRN
  Administered 2021-09-18: 3000 mL

## 2021-09-18 SURGICAL SUPPLY — 82 items
ATTUNE DIST FEM SZ5 4 KNEE (Miscellaneous) ×1 IMPLANT
AUG FEM SZ5 4 REV DIST STRL LF (Miscellaneous) ×1 IMPLANT
AUG TIB .75 UNV 5 REV KN (Joint) ×2 IMPLANT
AUG TIB ATTUNE UNV SZ3 4X5 (Joint) ×4 IMPLANT
AUGMENT TIB ATTUNE UNV SZ3 4X5 (Joint) IMPLANT
BAG COUNTER SPONGE SURGICOUNT (BAG) ×3 IMPLANT
BAG SPNG CNTER NS LX DISP (BAG) ×1
BANDAGE ESMARK 6X9 LF (GAUZE/BANDAGES/DRESSINGS) ×2 IMPLANT
BLADE CLIPPER SURG (BLADE) IMPLANT
BLADE SAG 18X100X1.27 (BLADE) ×3 IMPLANT
BLADE SAGITTAL 25.0X1.27X90 (BLADE) ×3 IMPLANT
BNDG CMPR 9X6 STRL LF SNTH (GAUZE/BANDAGES/DRESSINGS) ×1
BNDG COHESIVE 6X5 TAN STRL LF (GAUZE/BANDAGES/DRESSINGS) ×6 IMPLANT
BNDG ELASTIC 3X5.8 VLCR STR LF (GAUZE/BANDAGES/DRESSINGS) ×1 IMPLANT
BNDG ELASTIC 6X5.8 VLCR STR LF (GAUZE/BANDAGES/DRESSINGS) ×3 IMPLANT
BNDG ESMARK 6X9 LF (GAUZE/BANDAGES/DRESSINGS) ×2
BOWL SMART MIX CTS (DISPOSABLE) IMPLANT
BSPLAT TIB 3 CMNT REV ROT PLAT (Knees) ×1 IMPLANT
CEMENT BONE SIMPLEX SPEEDSET (Cement) ×6 IMPLANT
COMP FEM ATTUNE CRS SZ5 LT (Femur) ×2 IMPLANT
COMPONENT FEM ATN CRS SZ5 LT (Femur) IMPLANT
COVER SURGICAL LIGHT HANDLE (MISCELLANEOUS) ×3 IMPLANT
CUFF TOURN SGL QUICK 34 (TOURNIQUET CUFF)
CUFF TOURN SGL QUICK 42 (TOURNIQUET CUFF) IMPLANT
CUFF TRNQT CYL 34X4.125X (TOURNIQUET CUFF) IMPLANT
DRAPE ORTHO SPLIT 77X108 STRL (DRAPES) ×4
DRAPE SURG ORHT 6 SPLT 77X108 (DRAPES) ×4 IMPLANT
DRAPE U-SHAPE 47X51 STRL (DRAPES) ×3 IMPLANT
DRSG PAD ABDOMINAL 8X10 ST (GAUZE/BANDAGES/DRESSINGS) ×6 IMPLANT
DURAPREP 26ML APPLICATOR (WOUND CARE) ×3 IMPLANT
ELECT REM PT RETURN 9FT ADLT (ELECTROSURGICAL) ×2
ELECTRODE REM PT RTRN 9FT ADLT (ELECTROSURGICAL) ×2 IMPLANT
EVACUATOR 1/8 PVC DRAIN (DRAIN) IMPLANT
FACESHIELD STD STERILE (MASK) ×6 IMPLANT
GAUZE SPONGE 4X4 12PLY STRL (GAUZE/BANDAGES/DRESSINGS) ×3 IMPLANT
GAUZE SPONGE 4X4 12PLY STRL LF (GAUZE/BANDAGES/DRESSINGS) ×1 IMPLANT
GAUZE XEROFORM 1X8 LF (GAUZE/BANDAGES/DRESSINGS) ×3 IMPLANT
GAUZE XEROFORM 5X9 LF (GAUZE/BANDAGES/DRESSINGS) ×1 IMPLANT
GLOVE SRG 8 PF TXTR STRL LF DI (GLOVE) ×4 IMPLANT
GLOVE SURG ENC MOIS LTX SZ8 (GLOVE) ×3 IMPLANT
GLOVE SURG ORTHO LTX SZ7.5 (GLOVE) ×3 IMPLANT
GLOVE SURG UNDER POLY LF SZ8 (GLOVE) ×4
GOWN STRL REUS W/ TWL LRG LVL3 (GOWN DISPOSABLE) ×2 IMPLANT
GOWN STRL REUS W/ TWL XL LVL3 (GOWN DISPOSABLE) ×4 IMPLANT
GOWN STRL REUS W/TWL LRG LVL3 (GOWN DISPOSABLE) ×2
GOWN STRL REUS W/TWL XL LVL3 (GOWN DISPOSABLE) ×4
HANDPIECE INTERPULSE COAX TIP (DISPOSABLE)
IMMOBILIZER KNEE 22 UNIV (SOFTGOODS) ×3 IMPLANT
INSERT TIB CMT ATTUNE RP SZ3 (Knees) ×1 IMPLANT
INSERT TIB CRS ATTUNE SZ5 22 (Insert) ×1 IMPLANT
KIT BASIN OR (CUSTOM PROCEDURE TRAY) ×3 IMPLANT
KIT TURNOVER KIT B (KITS) ×3 IMPLANT
MANIFOLD NEPTUNE II (INSTRUMENTS) ×3 IMPLANT
NS IRRIG 1000ML POUR BTL (IV SOLUTION) ×3 IMPLANT
PACK TOTAL JOINT (CUSTOM PROCEDURE TRAY) ×3 IMPLANT
PAD ABD 8X10 STRL (GAUZE/BANDAGES/DRESSINGS) ×1 IMPLANT
PAD ARMBOARD 7.5X6 YLW CONV (MISCELLANEOUS) ×6 IMPLANT
PAD CAST 4YDX4 CTTN HI CHSV (CAST SUPPLIES) IMPLANT
PADDING CAST COTTON 4X4 STRL (CAST SUPPLIES) ×2
PADDING CAST COTTON 6X4 STRL (CAST SUPPLIES) ×3 IMPLANT
REV ATTUNE STEM 14X30 KNEE (Orthopedic Implant) ×2 IMPLANT
SET HNDPC FAN SPRY TIP SCT (DISPOSABLE) IMPLANT
SET PAD KNEE POSITIONER (MISCELLANEOUS) ×3 IMPLANT
STAPLER VISISTAT 35W (STAPLE) ×3 IMPLANT
STEM CEMT ATTUNE 14X80 (Knees) ×1 IMPLANT
STEM REV ATTUNE 14X30 KNEE (Orthopedic Implant) IMPLANT
SUCTION FRAZIER HANDLE 10FR (MISCELLANEOUS) ×2
SUCTION TUBE FRAZIER 10FR DISP (MISCELLANEOUS) ×2 IMPLANT
SUT VIC AB 0 CT1 27 (SUTURE) ×4
SUT VIC AB 0 CT1 27XBRD ANBCTR (SUTURE) ×4 IMPLANT
SUT VIC AB 1 CT1 27 (SUTURE) ×4
SUT VIC AB 1 CT1 27XBRD ANBCTR (SUTURE) ×4 IMPLANT
SUT VIC AB 2-0 CT1 27 (SUTURE) ×4
SUT VIC AB 2-0 CT1 TAPERPNT 27 (SUTURE) ×4 IMPLANT
SWAB COLLECTION DEVICE MRSA (MISCELLANEOUS) IMPLANT
SWAB CULTURE ESWAB REG 1ML (MISCELLANEOUS) ×3 IMPLANT
TOWEL GREEN STERILE (TOWEL DISPOSABLE) ×3 IMPLANT
TOWEL GREEN STERILE FF (TOWEL DISPOSABLE) ×3 IMPLANT
TRAY FOLEY W/BAG SLVR 16FR (SET/KITS/TRAYS/PACK)
TRAY FOLEY W/BAG SLVR 16FR ST (SET/KITS/TRAYS/PACK) IMPLANT
WATER STERILE IRR 1000ML POUR (IV SOLUTION) ×9 IMPLANT
WRAP KNEE MAXI GEL POST OP (GAUZE/BANDAGES/DRESSINGS) ×3 IMPLANT

## 2021-09-18 NOTE — Transfer of Care (Signed)
Immediate Anesthesia Transfer of Care Note  Patient: [name]  Procedure(s) Performed: LEFT TOTAL KNEE REVISION (Left: Knee)  Patient Location: PACU  Anesthesia Type:MAC, Regional and Spinal  Level of Consciousness: awake, alert  and oriented  Airway & Oxygen Therapy: Patient Spontanous Breathing  Post-op Assessment: Report given to RN and Post -op Vital signs reviewed and stable  Post vital signs: Reviewed and stable  Last Vitals:  Vitals Value Taken Time  BP 103/84 09/18/21 1018  Temp    Pulse 65 09/18/21 1021  Resp 20 09/18/21 1021  SpO2 94 % 09/18/21 1021  Vitals shown include unvalidated device data.  Last Pain:  Vitals:   09/18/21 0620  TempSrc:   PainSc: 8          Complications: No notable events documented.

## 2021-09-18 NOTE — Brief Op Note (Signed)
09/18/2021  9:50 AM  PATIENT:  Magda Kiel  76 y.o. female  PRE-OPERATIVE DIAGNOSIS:  failed left total knee arthroplasty  POST-OPERATIVE DIAGNOSIS:  failed left total knee arthroplasty  PROCEDURE:  Procedure(s): LEFT TOTAL KNEE REVISION (Left)  SURGEON:  Surgeon(s) and Role:    Mcarthur Rossetti, MD - Primary  PHYSICIAN ASSISTANT:  Benita Stabile, PA-C  ANESTHESIA:   regional and spinal  COUNTS:  YES  TOURNIQUET:   Total Tourniquet Time Documented: Thigh (Left) - 91 minutes Total: Thigh (Left) - 91 minutes   DICTATION: .Other Dictation: Dictation Number [id number]  PLAN OF CARE: Admit to inpatient   PATIENT DISPOSITION:  PACU - hemodynamically stable.   Delay start of Pharmacological VTE agent (>24hrs) due to surgical blood loss or risk of bleeding: no

## 2021-09-18 NOTE — Op Note (Signed)
NAME: Qu, [place] RECORD NO: [id number] ACCOUNT NO: [id number] DATE OF BIRTH: 1946-07-29 FACILITY: [facility] LOCATION: [facility] PHYSICIAN: Lind Guest. 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  PROCEDURE:  Revision arthroplasty, left knee, with revision of femoral and tibial components.  IMPLANTS:  DePuy Attune Revision knee system with size 5 left femur with a 4 mm lateral distal augment and a 14 x 80 cemented stem, Attune tibial revision tray size 3 with 5 mm medial and lateral wedges and a 14 x 30 cemented stem, a 22 mm RP constrained  polyethylene insert.  SURGEON:  Lind Guest. Ninfa Linden, MD  ASSISTANT:  Erskine Emery, PA-C  ANESTHESIA:   1.  Left lower extremity adductor canal block. 2.  Spinal.  ANTIBIOTICS:  2 g IV Ancef.  BLOOD LOSS:  Less than 500 mL.  TOURNIQUET TIME:  Less than 2 hours.  COMPLICATIONS:  None.  INDICATIONS:  The patient is a 76 year old female with debilitating left knee pain.  She had a left total knee arthroplasty done in 2006 by one of my colleagues in town.  She is morbidly obese and over time has lost significant weight.  She has developed  loosening of the components of her knee and her patella subluxes.  She does live at a skilled nursing facility and her mobility is so severely limited that she wished to proceed with a total knee arthroplasty.  I have talked to her and her family in  length about the difficulty of the surgery given her obesity, but she has lost a significant amount of weight.  We did talk about the risk of infection and the implant failing.  We talked about the risk of injury to blood vessels and nerves and the fact  that this implant could still fail.  She understands the risk of DVT as well as again major infection.  After  discussing these risks in detail, the patient and her family wished for [place] to proceed.  DESCRIPTION OF PROCEDURE:  After informed consent was obtained, and appropriate left knee was marked and adductor canal block was obtained on the left lower extremity in the holding room.  She was then brought to the operating room and sat up on the  operating table.  Spinal anesthesia was obtained.  She was laid in supine position on the operating table.  A Foley catheter was placed and a nonsterile tourniquet was placed around her upper left thigh.  Her left thigh, knee, leg, and foot were prepped  and draped with DuraPrep and sterile drapes.  A timeout was called.  She was identified correct patient, correct left knee.  We then used Esmarch to wrap that leg and tourniquet was inflated to 300 mm of pressure.  I then made a direct midline incision  over the patella and carried this proximally and distally.  I dissected down the knee joint, carried out a medial parapatellar arthrotomy, finding very large joint effusion, but  no evidence of infection.  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  With the knee in a flexed position, we removed the polyethylene liner easily because the knee subluxed and then the tibia component was so loose, it came out without even any effort.  We then removed cement debris from the tibia and  removed the femur, which took a little bit  more effort, but really not much.  Once we got all the cement debris removed, we did a freshening cut of the tibia, which did drop her joint line unfortunately.  She did not have good bone quality as well.  We  then began our tibial preparation choosing a size 3 tibia tray and then we did a new drill hole and punched off of this and then reamed for a 14 x 30 stem. We decided to place 5 mm wedge augments on the medial and lateral tibial plateau with the insert.   We then went to the femur and chose a size 5 femur due to our A to P  dimensions.  We then set a rotation off the epicondyles and did freshening cuts on the distal femoral cut and then our chamfer cuts and our anterior and posterior cuts.  We then  trialled a size 5 left femur with a 14 x 80 stem and we went all the way up to 22 mm thickness polyethylene insert.  This gave [place] stability with negative drawer sign and varus and valgus stability as well.  We then removed all trial instrumentation and  used 3 liters normal saline to completely irrigate out the knee, removing any synovium from irritation from her previous metallosis from the implant being loose.  We then mixed our cement, dried the knee real well.  We cemented our Attune tibial revision  tray size 3 with 5 mm medial and lateral wedges and a 14 x 30 stem setting the rotation of the tibial tubercle and trying to externally rotate what we could.  We then cemented our size 5 left femoral component with a 14 x 80 stem in a lateral augment.   We placed our real 22 mm rotating platform constrained polyethylene insert and held the knee completely compressed in a fully extended position for the cement to harden.  I then used towel clips to bring the patella back over and the patella tracked  centrally with no subluxation and the knee felt stable on our exam.  We then let the tourniquet down.  Hemostasis was obtained with electrocautery.  We closed our arthrotomy with interrupted #1 Ethibond suture followed by 0 Vicryl in the deep tissue and  2-0 Vicryl to close the subcutaneous tissue.  The skin was closed with staples.  A well-padded sterile dressing was applied.  She was taken to recovery room in stable condition with all final counts being correct and no complications noted.  Of note,  Erskine Emery, PA-C, did assist during the entire case and his assistance was crucial for facilitating every aspect of this case.   PAA D: 09/18/2021 9:48:16 am T: 09/18/2021 11:55:00 pm  JOB: [id number]/ [id number]

## 2021-09-18 NOTE — H&P (Signed)
TOTAL KNEE REVISION ADMISSION H&P  Patient is being admitted for left revision total knee arthroplasty.  Subjective:  Chief Complaint:left knee pain.  HPI: [name], 76 y.o. female, has a history of pain and functional disability in the left knee(s) due to failed previous arthroplasty and patient has failed non-surgical conservative treatments for greater than 12 weeks to include NSAID's and/or analgesics, flexibility and strengthening excercises, supervised PT with diminished ADL's post treatment, use of assistive devices, weight reduction as appropriate, and activity modification. The indications for the revision of the total knee arthroplasty are loosening of one or more components and bearing surface wear leading to symptomatic synovitis and tibiofemoral instability. Onset of symptoms was gradual starting 2 years ago with gradually worsening course since that time.  Prior procedures on the left knee(s) include arthroplasty.  Patient currently rates pain in the left knee(s) at 10 out of 10 with activity. There is worsening of pain with activity and weight bearing, pain that interferes with activities of daily living, and pain with passive range of motion.  Patient has evidence of prosthetic loosening by imaging studies. This condition presents safety issues increasing the risk of falls.  There is no current active infection.  Patient Active Problem List   Diagnosis Date Noted   Failed total knee, left, subsequent encounter 09/18/2021   Urinary incontinence 06/12/2021   History of recurrent UTI (urinary tract infection) 06/12/2021   Stage 3a chronic kidney disease ([place]) 03/05/2021   COPD (chronic obstructive pulmonary disease) ([place]) 03/05/2021   Iron deficiency 08/06/2018   History of total left knee replacement (TKR) 07/18/2018   Hemorrhoids 07/05/2018   Rotator cuff arthropathy of right shoulder    Shoulder arthritis 06/15/2017   Status post revision of total knee replacement, left  04/29/2017   Polyethylene liner wear following left total knee arthroplasty requiring isolated polyethylene liner exchange ([place]) 03/02/2017   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[place]) 03/02/2017   Acute on chronic diastolic CHF (congestive heart failure) ([place]) 03/29/2016   Obesity-BMI 40 01/14/2015   Restless leg 01/14/2015   Back pain 01/14/2015   Dementia- mild memory issues 01/14/2015   Occult blood positive stool 12/14/2014   Anemia 12/14/2014   Family history of colon cancer 12/14/2014   GERD (gastroesophageal reflux disease) 12/14/2014   Hypothyroidism 01/09/2014   Lymphedema 05/17/2012   HTN (hypertension) 05/17/2012   Past Medical History:  Diagnosis Date   Anemia    Anxiety    Arthritis    "hands, feet" (03/03/2017)   Brain lesion    2 types   Cataract    Cervical spondylosis with myelopathy    Chest pain    Normal cardiac cath 5/09   Chronic lower back pain    CKD (chronic kidney disease), stage III (HCC)    stage 3   Congestive heart failure (CHF) ([place])    has a Cardiomems implant    Constipation    Dementia ([place])    Depression    Dumping syndrome    Dyspnea    with activity Hx - no longer a problem per DON at SNF on 07/21/21   Edema    Fatigue    Fibromyalgia    GERD (gastroesophageal reflux disease)    Headache    "w/high CBG" (03/03/2017)   History of echocardiogram    a. Echo 03/20/16 (done at [facility] in [place], [place]):  mild LVH, EF 42%, normal diastolic function, mild LAE, MAC, RVSP 25 mmHg   Hyperlipidemia  Hypertension    Hypothyroidism    Lumbar spondylosis    Lymphedema    seeing specialist for this   Migraine    "mostly stopped when I changed my diet" (03/03/2017)   OAB (overactive bladder)    OSA on CPAP    does not use cpap   Pneumonia X 1   PONV (postoperative nausea and vomiting)    Restless leg syndrome    Rheumatoid arthritis ([place])    Stroke ([place])    TIAs "mini strokes"- unsure of last TIA - pt and daughter deny  this   Syncope    Tremors of nervous system    Type II diabetes mellitus ([place])    no meds, diet controlled    Past Surgical History:  Procedure Laterality Date   APPENDECTOMY  1966   BACK SURGERY     CARDIAC CATHETERIZATION N/A 05/25/2016   Procedure: Right/Left Heart Cath and Coronary Angiography;  Surgeon: Larey Dresser, MD;  Location: [facility];  Service: Cardiovascular;  Laterality: N/A;   CATARACT EXTRACTION W/ INTRAOCULAR LENS  IMPLANT, BILATERAL Bilateral    COLONOSCOPY     CORONARY ANGIOGRAM  2009   Normal coronaries   EXCISION/RELEASE BURSA HIP Bilateral    I & D KNEE WITH POLY EXCHANGE Left 03/02/2017   Procedure: Left knee Revision of poly liner;  Surgeon: Mcarthur Rossetti, MD;  Location: [facility];  Service: Orthopedics;  Laterality: Left;   JOINT REPLACEMENT     KNEE ARTHROSCOPY Bilateral    LAPAROSCOPIC CHOLECYSTECTOMY  2015   LMF  2017   CardiMEMS HF implant for CHF (measures amount of fluid in the heart)   LUMBAR DISC SURGERY     LUMBAR LAMINECTOMY/DECOMPRESSION MICRODISCECTOMY Left 12/2005   L2-3 laminectomy and diskectomy/notes 01/06/2011   POSTERIOR LUMBAR FUSION  04/2006   [name] 01/06/2011; "put cages in"   REVERSE SHOULDER ARTHROPLASTY Left 06/15/2017   Procedure: REVERSE LEFT SHOULDER ARTHROPLASTY;  Surgeon: Meredith Pel, MD;  Location: [facility];  Service: Orthopedics;  Laterality: Left;   REVERSE SHOULDER ARTHROPLASTY Right 02/01/2018   REVERSE SHOULDER ARTHROPLASTY Right 02/01/2018   Procedure: RIGHT REVERSE SHOULDER ARTHROPLASTY;  Surgeon: Meredith Pel, MD;  Location: [facility];  Service: Orthopedics;  Laterality: Right;   REVISION TOTAL KNEE ARTHROPLASTY Left 03/02/2017   poly liner/notes 03/02/2017   RIGHT HEART CATH N/A 11/13/2016   Procedure: Right Heart Cath with Cardiomems;  Surgeon: Larey Dresser, MD;  Location: [facility];  Service: Cardiovascular;  Laterality: N/A;   SHOULDER ARTHROSCOPY WITH ROTATOR CUFF REPAIR Right     SHOULDER OPEN ROTATOR CUFF REPAIR Left 01/2011   [name] 01/29/2011   TOTAL ABDOMINAL HYSTERECTOMY     TOTAL KNEE ARTHROPLASTY Left    TOTAL KNEE ARTHROPLASTY Right 11/18/2012   Procedure: TOTAL KNEE ARTHROPLASTY;  Surgeon: Yvette Rack., MD;  Location: [facility];  Service: Orthopedics;  Laterality: Right;   TUBAL LIGATION     UPPER GASTROINTESTINAL ENDOSCOPY     UPPER GI ENDOSCOPY      Current Facility-Administered Medications  Medication Dose Route Frequency Provider Last Rate Last Admin   ceFAZolin (ANCEF) IVPB 2g/100 mL premix  2 g Intravenous On Call to OR Pete Pelt, PA-C       fentaNYL (SUBLIMAZE) 100 MCG/2ML injection            lactated ringers infusion   Intravenous Continuous Duane Boston, MD       midazolam (VERSED) 2 MG/2ML injection  tranexamic acid (CYKLOKAPRON) IVPB 1,000 mg  1,000 mg Intravenous To OR Pete Pelt, PA-C       No Known Allergies  Social History   Tobacco Use   Smoking status: Never   Smokeless tobacco: Never  Substance Use Topics   Alcohol use: No    Alcohol/week: 0.0 standard drinks    Family History  Problem Relation Age of Onset   Heart disease Mother    Heart failure Mother    Kidney disease Mother    Lung cancer Father    Colon cancer Brother    Cancer Brother        malignant thymoma   Heart disease Brother    Tremor Maternal Grandmother 90   Ovarian cancer Maternal Grandmother    Uterine cancer Maternal Grandmother    Irritable bowel syndrome Daughter    Esophageal cancer Neg Hx    Stomach cancer Neg Hx    Inflammatory bowel disease Neg Hx    Liver disease Neg Hx    Pancreatic cancer Neg Hx    Rectal cancer Neg Hx       Review of Systems  Musculoskeletal:  Positive for gait problem and joint swelling.  All other systems reviewed and are negative.   Objective:  Physical Exam Vitals reviewed.  Constitutional:      Appearance: Normal appearance.  HENT:     Head: Normocephalic and atraumatic.  Eyes:      Extraocular Movements: Extraocular movements intact.     Pupils: Pupils are equal, round, and reactive to light.  Cardiovascular:     Rate and Rhythm: Normal rate.  Pulmonary:     Effort: Pulmonary effort is normal.  Abdominal:     Palpations: Abdomen is soft.  Musculoskeletal:     Cervical back: Normal range of motion.     Left knee: Effusion and bony tenderness present. Tenderness present over the medial joint line and lateral joint line.     Instability Tests: Anterior drawer test positive.  Neurological:     Mental Status: She is alert and oriented to person, place, and time.  Psychiatric:        Behavior: Behavior normal.    Vital signs in last 24 hours: Temp:  [98 F (36.7 C)] 98 F (36.7 C) (01/26 0549) Pulse Rate:  [70] 70 (01/26 0549) Resp:  [18] 18 (01/26 0549) BP: (144)/(68) 144/68 (01/26 0549) SpO2:  [95 %] 95 % (01/26 0549)  Labs:  Estimated body mass index is 34.3 kg/m as calculated from the following:   Height as of 07/21/21: _0  (1.702 m).   Weight as of 07/21/21: 99.3 kg.  Imaging Review Plain radiographs demonstrate loosening of the left total knee prosthesis   Assessment/Plan:   left knee(s) with failed previous arthroplasty.   The patient history, physical examination, clinical judgment of the provider and imaging studies are consistent with a failed total knee replacement of the left knee(s), previous total knee arthroplasty. Revision total knee arthroplasty is deemed medically necessary. The treatment options including medical management, injection therapy, arthroscopy and revision arthroplasty were discussed at length. The risks and benefits of revision total knee arthroplasty were presented and reviewed. The risks due to aseptic loosening, infection, stiffness, patella tracking problems, thromboembolic complications and other imponderables were discussed. The patient acknowledged the explanation, agreed to proceed with the plan and consent was  signed. Patient is being admitted for inpatient treatment for surgery, pain control, PT, OT, prophylactic antibiotics, VTE prophylaxis, progressive ambulation and ADL's  and discharge planning.The patient is planning to be discharged to skilled nursing facility

## 2021-09-18 NOTE — Anesthesia Postprocedure Evaluation (Signed)
Anesthesia Post Note  Patient: [name]  Procedure(s) Performed: LEFT TOTAL KNEE REVISION (Left: Knee)     Patient location during evaluation: PACU Anesthesia Type: Spinal and MAC Level of consciousness: awake and alert Pain management: pain level controlled Vital Signs Assessment: post-procedure vital signs reviewed and stable Respiratory status: spontaneous breathing and respiratory function stable Cardiovascular status: blood pressure returned to baseline and stable Postop Assessment: spinal receding Anesthetic complications: no   No notable events documented.  Last Vitals:  Vitals:   09/18/21 1105 09/18/21 1120  BP: (!) 115/50 (!) 137/56  Pulse: 64 (!) 57  Resp: 12 16  Temp:  (!) 36.1 C  SpO2: 96% 97%    Last Pain:  Vitals:   09/18/21 1120  TempSrc:   PainSc: 0-No pain                 Darrien Belter DANIEL

## 2021-09-18 NOTE — Progress Notes (Signed)
Patient transported to 5N22. Receiving RN at bedside. Called and spoke with daughter, Vicente Males and updated her on the patients location.  Rowe Pavy, RN

## 2021-09-18 NOTE — Anesthesia Procedure Notes (Signed)
Spinal  Patient location during procedure: OR Start time: 09/18/2021 7:28 AM End time: 09/18/2021 7:33 AM Reason for block: surgical anesthesia Staffing Performed: anesthesiologist  Anesthesiologist: Duane Boston, MD Preanesthetic Checklist Completed: patient identified, IV checked, risks and benefits discussed, surgical consent, monitors and equipment checked, pre-op evaluation and timeout performed Spinal Block Patient position: sitting Prep: DuraPrep Patient monitoring: cardiac monitor, continuous pulse ox and blood pressure Approach: midline Injection technique: single-shot Needle Needle type: Pencan  Needle gauge: 24 G Needle length: 9 cm Assessment Events: CSF return Additional Notes Functioning IV was confirmed and monitors were applied. Sterile prep and drape, including hand hygiene and sterile gloves were used. The patient was positioned and the spine was prepped. The skin was anesthetized with lidocaine.  Free flow of clear CSF was obtained prior to injecting local anesthetic into the CSF.  The spinal needle aspirated freely following injection.  The needle was carefully withdrawn.  The patient tolerated the procedure well.

## 2021-09-18 NOTE — Evaluation (Signed)
Occupational Therapy Evaluation Patient Details Name: [name] MRN: [id number] DOB: 1946-02-28 Today's Date: 09/18/2021   History of Present Illness 76 y.o. female presents to [facility] hospital on 09/18/2021 with failed previous arthroplasty of L knee. Pt underwent L total knee revision on 09/18/2021. PMH includes anxiety, OA, CKD, CHF, dementia, depression, HTN, HLD, OSA, DMII.   Clinical Impression   Patient admitted form SNF for the above procedure.  PTA she was limited to bed to wheelchair transfers, and needed assist for ADL completion at wheelchair level.  Pain and generalized weakness are the primary deficits.  Currently she is needing up to Max A for ADL completion at bedlevel. OT will follow in the acute setting, but she will require SNF for Max A post acute.         Recommendations for follow up therapy are one component of a multi-disciplinary discharge planning process, led by the attending physician.  Recommendations may be updated based on patient status, additional functional criteria and insurance authorization.   Follow Up Recommendations  Skilled nursing-short term rehab (<3 hours/day)    Assistance Recommended at Discharge Frequent or constant Supervision/Assistance  Patient can return home with the following A lot of help with walking and/or transfers;A lot of help with bathing/dressing/bathroom;Direct supervision/assist for medications management;Assist for transportation;Help with stairs or ramp for entrance    Functional Status Assessment  Patient has had a recent decline in their functional status and demonstrates the ability to make significant improvements in function in a reasonable and predictable amount of time.  Equipment Recommendations  None recommended by OT    Recommendations for Other Services       Precautions / Restrictions Precautions Precautions: Fall Required Braces or Orthoses: Knee Immobilizer - Left Knee Immobilizer - Left:  (no orders for KI  in chart. PT maintains with attempts at standing this session) Restrictions Weight Bearing Restrictions: Yes LLE Weight Bearing: Weight bearing as tolerated Other Position/Activity Restrictions: KI in room, no order noted for its use.      Mobility Bed Mobility               General bed mobility comments: trendelenburg used to scoot patient higher for pm meal.  Max A    Transfers                          Balance                                           ADL either performed or assessed with clinical judgement   ADL Overall ADL's : Needs assistance/impaired Eating/Feeding: Set up;Bed level   Grooming: Wash/dry hands;Wash/dry face;Set up;Bed level   Upper Body Bathing: Minimal assistance;Bed level   Lower Body Bathing: Bed level;Maximal assistance   Upper Body Dressing : Minimal assistance;Bed level;Moderate assistance   Lower Body Dressing: Maximal assistance;Bed level                       Vision Patient Visual Report: No change from baseline       Perception Perception Perception: Within Functional Limits   Praxis Praxis Praxis: Intact    Pertinent Vitals/Pain Pain Assessment Pain Assessment: Faces Faces Pain Scale: Hurts little more Pain Location: L knee Pain Descriptors / Indicators: Grimacing Pain Intervention(s): Monitored during session     Hand Dominance Right  Extremity/Trunk Assessment Upper Extremity Assessment Upper Extremity Assessment: Generalized weakness   Lower Extremity Assessment Lower Extremity Assessment: Defer to PT evaluation RLE Deficits / Details: RLE grossly 4-/5 LLE Deficits / Details: L knee AAROM flexion ~40 degrees, knee extension WFL, ankle PF/DF 4/5, knee extension and flexion grossly limited at this time   Cervical / Trunk Assessment Cervical / Trunk Assessment: Other exceptions Cervical / Trunk Exceptions: excess body habitus   Communication Communication Communication:  No difficulties   Cognition Arousal/Alertness: Awake/alert Behavior During Therapy: WFL for tasks assessed/performed Overall Cognitive Status: Difficult to assess                                 General Comments: patient with difficutly describing baseline function, and DME needed.  ? ST memory.  Patient stating she could not remember if she spent most of the day in her w/c.     General Comments  VSS on RA    Exercises     Shoulder Instructions      Home Living Family/patient expects to be discharged to:: Skilled nursing facility                                 Additional Comments: [facility] from [facility], reports she would prefer to discharge to a new facility      Prior Functioning/Environment Prior Level of Function : Needs assist       Physical Assist : ADLs (physical) Mobility (physical): Transfers ADLs (physical): Bathing;Dressing;Toileting Mobility Comments: pt reports performing stand pivot transfers from bed to wheelchair with assistance. Pt has been having considerable mobility issues over the last few years, attributing this due to complications with previous L knee arthroplasty ADLs Comments: Patient stating that she had progressed from bedlevel ADL to sitting in wheelchair, and reports able to stand for assist with pant management.        OT Problem List: Decreased strength;Decreased range of motion;Decreased activity tolerance;Impaired balance (sitting and/or standing);Decreased cognition;Decreased safety awareness;Pain;Obesity      OT Treatment/Interventions: Self-care/ADL training;Therapeutic activities;Patient/family education;DME and/or AE instruction;Balance training    OT Goals(Current goals can be found in the care plan section) Acute Rehab OT Goals Patient Stated Goal: Return to SNF OT Goal Formulation: With patient Time For Goal Achievement: 10/02/21 Potential to Achieve Goals: Good ADL Goals Pt Will Perform Grooming: with  set-up;sitting Pt Will Perform Upper Body Bathing: with set-up;sitting Pt Will Perform Upper Body Dressing: with set-up;sitting Pt Will Perform Lower Body Dressing: with mod assist;sitting/lateral leans;with adaptive equipment Pt Will Transfer to Toilet: with mod assist;stand pivot transfer;bedside commode  OT Frequency: Min 2X/week    Co-evaluation              AM-PAC OT "6 Clicks" Daily Activity     Outcome Measure Help from another person eating meals?: A Little Help from another person taking care of personal grooming?: A Little Help from another person toileting, which includes using toliet, bedpan, or urinal?: Total Help from another person bathing (including washing, rinsing, drying)?: A Lot Help from another person to put on and taking off regular upper body clothing?: A Little Help from another person to put on and taking off regular lower body clothing?: A Lot 6 Click Score: 14   End of Session    Activity Tolerance: Patient tolerated treatment well Patient left: in bed;with call bell/phone within reach  OT Visit Diagnosis: Pain Pain - Right/Left: Left Pain - part of body: Knee                Time: 2824-1753 OT Time Calculation (min): 17 min Charges:  OT General Charges $OT Visit: 1 Visit OT Evaluation $OT Eval Moderate Complexity: 1 Mod  09/18/2021  RP, OTR/L  Acute Rehabilitation Services  Office:  [phone]   Metta Clines 09/18/2021, 5:55 PM

## 2021-09-18 NOTE — Evaluation (Signed)
Physical Therapy Evaluation Patient Details Name: [name] MRN: [id number] DOB: 12-18-1945 Today's Date: 09/18/2021  History of Present Illness  76 y.o. female presents to [facility] hospital on 09/18/2021 with failed previous arthroplasty of L knee. Pt underwent L total knee revision on 09/18/2021. PMH includes anxiety, OA, CKD, CHF, dementia, depression, HTN, HLD, OSA, DMII.  Clinical Impression  Pt presents to PT with deficits in functional mobility, gait, balance, strength, power, endurance, ROM. Pt with acute on chronic LLE mobility deficits. Pt has been limited to performing stand pivot transfers with assist from bed to wheelchair in recent months due to L knee replacement failure. Pt is highly motivated to improve L knee ROM and strength within session, transferring multiple times with physical assistance. Pt will continue to benefit from aggressive mobilization to aide in improving functional mobility and reducing falls risk.     Recommendations for follow up therapy are one component of a multi-disciplinary discharge planning process, led by the attending physician.  Recommendations may be updated based on patient status, additional functional criteria and insurance authorization.  Follow Up Recommendations Skilled nursing-short term rehab (<3 hours/day)    Assistance Recommended at Discharge Intermittent Supervision/Assistance  Patient can return home with the following  A lot of help with walking and/or transfers;A lot of help with bathing/dressing/bathroom;Assist for transportation    Equipment Recommendations  (defer to post-acute)  Recommendations for Other Services       Functional Status Assessment Patient has had a recent decline in their functional status and demonstrates the ability to make significant improvements in function in a reasonable and predictable amount of time.     Precautions / Restrictions Precautions Precautions: Fall Required Braces or Orthoses: Knee  Immobilizer - Left Knee Immobilizer - Left:  (no orders for KI in chart. PT maintains with attempts at standing this session) Restrictions Weight Bearing Restrictions: Yes LLE Weight Bearing: Weight bearing as tolerated      Mobility  Bed Mobility Overal bed mobility: Needs Assistance Bed Mobility: Supine to Sit, Sit to Supine, Rolling Rolling: Supervision   Supine to sit: Min guard, HOB elevated Sit to supine: Mod assist   General bed mobility comments: assist for LEs back to bed. Use of rails    Transfers Overall transfer level: Needs assistance Equipment used: Rolling walker (2 wheels), 1 person hand held assist Transfers: Sit to/from Stand Sit to Stand: From elevated surface, Max assist           General transfer comment: pt with impaired ability to extend R knee in standing, requires verbal cues for trunk flexion    Ambulation/Gait             Pre-gait activities: weight shift after 1st successful stand attempt, limited tolerance    Stairs            Wheelchair Mobility    Modified Rankin (Stroke Patients Only)       Balance Overall balance assessment: Needs assistance Sitting-balance support: Single extremity supported, Bilateral upper extremity supported, Feet supported Sitting balance-Leahy Scale: Poor Sitting balance - Comments: pt often electing to lay backward across bed to rest   Standing balance support: Bilateral upper extremity supported, Reliant on assistive device for balance Standing balance-Leahy Scale: Poor Standing balance comment: mod-maxA                             Pertinent Vitals/Pain Pain Assessment Pain Assessment: Faces Faces Pain Scale: Hurts whole lot  Pain Location: L knee Pain Descriptors / Indicators: Grimacing Pain Intervention(s): Monitored during session    Home Living Family/patient expects to be discharged to:: Skilled nursing facility                   Additional Comments: [facility] from  [facility], reports she would prefer to discharge to a new facility    Prior Function Prior Level of Function : Needs assist       Physical Assist : Mobility (physical) Mobility (physical): Transfers   Mobility Comments: pt reports performing stand pivot transfers from bed to wheelchair with assistance. Pt has been having considerable mobility issues over the last few years, attributing this due to complications with previous L knee arthroplasty       Hand Dominance   Dominant Hand: Right    Extremity/Trunk Assessment   Upper Extremity Assessment Upper Extremity Assessment: Generalized weakness (grossly 4/5, pt with shoulder ROM deficits chronically due to reported old acromion fxs)    Lower Extremity Assessment Lower Extremity Assessment: LLE deficits/detail;RLE deficits/detail RLE Deficits / Details: RLE grossly 4-/5 LLE Deficits / Details: L knee AAROM flexion ~40 degrees, knee extension WFL, ankle PF/DF 4/5, knee extension and flexion grossly limited at this time    Cervical / Trunk Assessment Cervical / Trunk Assessment: Other exceptions Cervical / Trunk Exceptions: excess body habitus  Communication   Communication: No difficulties  Cognition Arousal/Alertness: Awake/alert Behavior During Therapy: WFL for tasks assessed/performed Overall Cognitive Status: Within Functional Limits for tasks assessed                                          General Comments General comments (skin integrity, edema, etc.): VSS on RA    Exercises     Assessment/Plan    PT Assessment Patient needs continued PT services  PT Problem List Decreased strength;Decreased range of motion;Decreased activity tolerance;Decreased balance;Decreased mobility;Pain       PT Treatment Interventions DME instruction;Gait training;Functional mobility training;Therapeutic activities;Therapeutic exercise;Balance training;Neuromuscular re-education;Patient/family education;Wheelchair  mobility training    PT Goals (Current goals can be found in the Care Plan section)  Acute Rehab PT Goals Patient Stated Goal: to regain L knee ROM and strength PT Goal Formulation: With patient/family Time For Goal Achievement: 10/02/21 Potential to Achieve Goals: Fair    Frequency 7X/week     Co-evaluation               AM-PAC PT "6 Clicks" Mobility  Outcome Measure Help needed turning from your back to your side while in a flat bed without using bedrails?: A Little Help needed moving from lying on your back to sitting on the side of a flat bed without using bedrails?: A Little Help needed moving to and from a bed to a chair (including a wheelchair)?: Total Help needed standing up from a chair using your arms (e.g., wheelchair or bedside chair)?: A Lot Help needed to walk in hospital room?: Total Help needed climbing 3-5 steps with a railing? : Total 6 Click Score: 11    End of Session Equipment Utilized During Treatment: Left knee immobilizer Activity Tolerance: Patient tolerated treatment well Patient left: in bed;with call bell/phone within reach;with bed alarm set;with family/visitor present Nurse Communication: Mobility status PT Visit Diagnosis: Other abnormalities of gait and mobility (R26.89);Muscle weakness (generalized) (M62.81);Pain Pain - Right/Left: Left Pain - part of body: Knee  Time: 0254-8628 PT Time Calculation (min) (ACUTE ONLY): 38 min   Charges:   PT Evaluation $PT Eval Low Complexity: 1 Low PT Treatments $Therapeutic Activity: 8-22 mins        Zenaida Niece, PT, DPT Acute Rehabilitation Pager: [phone] Office [phone]   Zenaida Niece 09/18/2021, 4:50 PM

## 2021-09-19 ENCOUNTER — Encounter (HOSPITAL_COMMUNITY): Payer: Self-pay | Admitting: Orthopaedic Surgery

## 2021-09-19 LAB — CBC
HCT: 30.7 % — ABNORMAL LOW (ref 36.0–46.0)
Hemoglobin: 9.8 g/dL — ABNORMAL LOW (ref 12.0–15.0)
MCH: 30.2 pg (ref 26.0–34.0)
MCHC: 31.9 g/dL (ref 30.0–36.0)
MCV: 94.5 fL (ref 80.0–100.0)
Platelets: 221 10*3/uL (ref 150–400)
RBC: 3.25 MIL/uL — ABNORMAL LOW (ref 3.87–5.11)
RDW: 14.8 % (ref 11.5–15.5)
WBC: 11.3 10*3/uL — ABNORMAL HIGH (ref 4.0–10.5)
nRBC: 0 % (ref 0.0–0.2)

## 2021-09-19 MED ORDER — CHLORHEXIDINE GLUCONATE CLOTH 2 % EX PADS
6.0000 | MEDICATED_PAD | Freq: Every day | CUTANEOUS | Status: DC
  Administered 2021-09-19 – 2021-09-22 (×4): 6 via TOPICAL

## 2021-09-19 NOTE — TOC Initial Note (Addendum)
Transition of Care [facility]) - Initial/Assessment Note    Patient Details  Name: [name] MRN: [id number] Date of Birth: 1946-05-24  [facility] CM/SW Contact:    Coralee Pesa, [place] Phone Number: 09/19/2021, 12:54 PM  Clinical Narrative:                 4pm: CSW received a call from [place] at [facility] who has accepted pt. Horris Latino notes there is an issue with the insurance that she is working out with the daughter. Pt will be able to admit without an authorization when medically ready. TOC will continue to follow for DC needs.   CSW met with pt and daughter at bedside. Pt is upset and defers to daughter. Daughter states pt has been at [facility] for two years and they do not want her to return. She states she has been speaking with Horris Latino at [facility] [phone]) and daughter stated she was told they could take her under short term placement and then switch her to LTC. If [facility] does not accept, they would like [facility] or somewhere closer to [place]. CSW explained that LTC can be difficult to arrange and if pt is not accepted into a new facility, she will have to return to [facility] and continue the transition outside the hospital. [place] spoke with Horris Latino at [facility] who stated she would look at the referral and see if pt can be admitted, referral faxed to [phone]. TOC will continue to follow for DC planning. Expected Discharge Plan: Skilled Nursing Facility Barriers to Discharge: SNF Pending bed offer, Continued Medical Work up   Patient Goals and CMS Choice Patient states their goals for this hospitalization and ongoing recovery are:: Pt states she does not want to return to [facility]. CMS Medicare.gov Compare Post Acute Care list provided to:: Patient Represenative (must comment) Vicente Males) Choice offered to / list presented to : Adult Children  Expected Discharge Plan and Services Expected Discharge Plan: [place] Acute Care  Choice: [place] arrangements for the past 2 months: [place]                                      Prior Living Arrangements/Services Living arrangements for the past 2 months: [place] Lives with:: Facility Resident Patient language and need for interpreter reviewed:: Yes Do you feel safe going back to the place where you live?: Yes      Need for Family Participation in Patient Care: Yes (Comment) Care giver support system in place?: Yes (comment)   Criminal Activity/Legal Involvement Pertinent to Current Situation/Hospitalization: No - Comment as needed  Activities of Daily Living Home Assistive Devices/Equipment: Eyeglasses, Dentures (specify type), Wheelchair ADL Screening (condition at time of admission) Patient's cognitive ability adequate to safely complete daily activities?: Yes Is the patient deaf or have difficulty hearing?: Yes Does the patient have difficulty seeing, even when wearing glasses/contacts?: No Does the patient have difficulty concentrating, remembering, or making decisions?: Yes Patient able to express need for assistance with ADLs?: Yes Does the patient have difficulty dressing or bathing?: Yes Independently performs ADLs?: No Communication: Independent Dressing (OT): Needs assistance Is this a change from baseline?: Pre-admission baseline Grooming: Independent Feeding: Independent Bathing: Independent Toileting: Independent In/Out Bed: Independent Walks in Home:  (wheelchair) Does the patient have difficulty  walking or climbing stairs?: Yes Weakness of Legs: Both Weakness of Arms/Hands: Both  Permission Sought/Granted Permission sought to share information with : Family Supports Permission granted to share information with : Yes, Verbal Permission Granted  Share Information with NAME: [name]     Permission granted to share info w Relationship: Daughter  Permission granted to  share info w Contact Information: [phone]  Emotional Assessment Appearance:: Appears stated age Attitude/Demeanor/Rapport: Crying Affect (typically observed): Tearful/Crying Orientation: : Oriented to Self, Oriented to Place, Oriented to  Time, Oriented to Situation Alcohol / Substance Use: Not Applicable Psych Involvement: No (comment)  Admission diagnosis:  Status post revision of total replacement of left knee [Z96.652] Patient Active Problem List   Diagnosis Date Noted   Failed total knee, left, subsequent encounter 09/18/2021   Status post revision of total replacement of left knee 09/18/2021   Urinary incontinence 06/12/2021   History of recurrent UTI (urinary tract infection) 06/12/2021   Stage 3a chronic kidney disease ([place]) 03/05/2021   COPD (chronic obstructive pulmonary disease) ([place]) 03/05/2021   Iron deficiency 08/06/2018   History of total left knee replacement (TKR) 07/18/2018   Hemorrhoids 07/05/2018   Rotator cuff arthropathy of right shoulder    Shoulder arthritis 06/15/2017   Status post revision of total knee replacement, left 04/29/2017   Polyethylene liner wear following left total knee arthroplasty requiring isolated polyethylene liner exchange ([place]) 03/02/2017   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[place]) 03/02/2017   Acute on chronic diastolic CHF (congestive heart failure) ([place]) 03/29/2016   Obesity-BMI 40 01/14/2015   Restless leg 01/14/2015   Back pain 01/14/2015   Dementia- mild memory issues 01/14/2015   Occult blood positive stool 12/14/2014   Anemia 12/14/2014   Family history of colon cancer 12/14/2014   GERD (gastroesophageal reflux disease) 12/14/2014   Hypothyroidism 01/09/2014   Lymphedema 05/17/2012   HTN (hypertension) 05/17/2012   PCP:  System, Provider Not In Pharmacy:  No Pharmacies Listed    Social Determinants of Health (SDOH) Interventions    Readmission Risk Interventions No flowsheet data found.

## 2021-09-19 NOTE — Progress Notes (Signed)
Subjective: 1 Day Post-Op Procedure(s) (LRB): LEFT TOTAL KNEE REVISION (Left) Patient reports pain as moderate.    Objective: Vital signs in last 24 hours: Temp:  [97 F (36.1 C)-99 F (37.2 C)] 99 F (37.2 C) (01/27 0453) Pulse Rate:  [55-90] 82 (01/27 0453) Resp:  [12-21] 16 (01/27 0453) BP: (103-137)/(50-97) 114/88 (01/27 0453) SpO2:  [86 %-97 %] 92 % (01/27 0453) Weight:  [99.1 kg] 99.1 kg (01/26 1240)  Intake/Output from previous day: 01/26 0701 - 01/27 0700 In: 3195 [P.O.:195; I.V.:2200; IV Piggyback:800] Out: 4500 [Urine:4400; Blood:100] Intake/Output this shift: Total I/O In: 1170 [P.O.:120; I.V.:1000; IV Piggyback:50] Out: -   Recent Labs    09/18/21 0547 09/19/21 0130  HGB 10.7* 9.8*   Recent Labs    09/18/21 0547 09/19/21 0130  WBC 9.0 11.3*  RBC 3.51* 3.25*  HCT 34.0* 30.7*  PLT 253 221   Recent Labs    09/18/21 0547  NA 141  K 3.9  CL 102  CO2 29  BUN 26*  CREATININE 1.18*  GLUCOSE 100*  CALCIUM 10.5*   No results for input(s): LABPT, INR in the last 72 hours.  Sensation intact distally Intact pulses distally Dorsiflexion/Plantar flexion intact Incision: dressing C/D/I   Assessment/Plan: 1 Day Post-Op Procedure(s) (LRB): LEFT TOTAL KNEE REVISION (Left) Up with therapy Discharge to SNF early next week.      Mcarthur Rossetti 09/19/2021, 6:37 AM

## 2021-09-19 NOTE — [place] FL2 (Signed)
[place] MEDICAID FL2 LEVEL OF CARE SCREENING TOOL     IDENTIFICATION  Patient Name: [name] Birthdate: 10/29/1945 Sex: female Admission Date (Current Location): 09/18/2021  [facility] [place] Number:  Herbalist and Address:  [facility] [place], [place] [address] [place], [place] [address]      Provider Number: [id number]  Attending Physician Name and Address:  Mcarthur Rossetti,*  Relative Name and Phone Number:  [name], [phone]    Current Level of Care: Hospital Recommended Level of Care: [place] Prior Approval Number:    Date Approved/Denied:   PASRR Number: [id number]  Discharge Plan: SNF    Current Diagnoses: Patient Active Problem List   Diagnosis Date Noted   Failed total knee, left, subsequent encounter 09/18/2021   Status post revision of total replacement of left knee 09/18/2021   Urinary incontinence 06/12/2021   History of recurrent UTI (urinary tract infection) 06/12/2021   Stage 3a chronic kidney disease ([place]) 03/05/2021   COPD (chronic obstructive pulmonary disease) ([place]) 03/05/2021   Iron deficiency 08/06/2018   History of total left knee replacement (TKR) 07/18/2018   Hemorrhoids 07/05/2018   Rotator cuff arthropathy of right shoulder    Shoulder arthritis 06/15/2017   Status post revision of total knee replacement, left 04/29/2017   Polyethylene liner wear following left total knee arthroplasty requiring isolated polyethylene liner exchange ([place]) 03/02/2017   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[place]) 03/02/2017   Acute on chronic diastolic CHF (congestive heart failure) ([place]) 03/29/2016   Obesity-BMI 40 01/14/2015   Restless leg 01/14/2015   Back pain 01/14/2015   Dementia- mild memory issues 01/14/2015   Occult blood positive stool 12/14/2014   Anemia 12/14/2014   Family history of colon cancer 12/14/2014   GERD (gastroesophageal reflux disease) 12/14/2014    Hypothyroidism 01/09/2014   Lymphedema 05/17/2012   HTN (hypertension) 05/17/2012    Orientation RESPIRATION BLADDER Height & Weight     Self, Time, Situation, Place  Normal Indwelling catheter, Continent Weight: 218 lb 7.6 oz (99.1 kg) Height:  5\' 5"  (165.1 cm)  BEHAVIORAL SYMPTOMS/MOOD NEUROLOGICAL BOWEL NUTRITION STATUS      Continent Diet (See DC summary)  AMBULATORY STATUS COMMUNICATION OF NEEDS Skin   Extensive Assist Verbally Surgical wounds (L knee surgical incision)                       Personal Care Assistance Level of Assistance  Bathing, Feeding, Dressing Bathing Assistance: Limited assistance Feeding assistance: Limited assistance Dressing Assistance: Limited assistance     Functional Limitations Info  Sight, Hearing, Speech Sight Info: Impaired Hearing Info: Adequate Speech Info: Adequate    SPECIAL CARE FACTORS FREQUENCY  PT (By licensed PT), OT (By licensed OT)     PT Frequency: 5x week OT Frequency: 5x week            Contractures Contractures Info: Not present    Additional Factors Info  Code Status, Allergies, Psychotropic Code Status Info: Full Allergies Info: NKA Psychotropic Info: Duoloxetine, Venlafaxine         Current Medications (09/19/2021):  This is the current hospital active medication list Current Facility-Administered Medications  Medication Dose Route Frequency Provider Last Rate Last Admin   0.9 %  sodium chloride infusion   Intravenous Continuous Mcarthur Rossetti, MD 75 mL/hr at 09/19/21 0637 New Bag at 09/19/21 0637   acetaminophen (TYLENOL) tablet 325-650 mg  325-650 mg  Oral Q6H PRN Mcarthur Rossetti, MD   650 mg at 09/19/21 0847   albuterol (PROVENTIL) (2.5 MG/3ML) 0.083% nebulizer solution 3 mL  3 mL Inhalation Q4H PRN Mcarthur Rossetti, MD       alum & mag hydroxide-simeth (MAALOX/MYLANTA) 200-200-20 MG/5ML suspension 30 mL  30 mL Oral Q4H PRN Mcarthur Rossetti, MD       amLODipine  (NORVASC) tablet 2.5 mg  2.5 mg Oral Daily Mcarthur Rossetti, MD   2.5 mg at 09/19/21 0848   ascorbic acid (VITAMIN C) tablet 1,000 mg  1,000 mg Oral Daily Mcarthur Rossetti, MD   1,000 mg at 09/19/21 0847   aspirin EC tablet 81 mg  81 mg Oral Daily Mcarthur Rossetti, MD   81 mg at 09/19/21 0847   atorvastatin (LIPITOR) tablet 40 mg  40 mg Oral QPM Mcarthur Rossetti, MD   40 mg at 09/18/21 1626   baclofen (LIORESAL) tablet 10 mg  10 mg Oral TID PRN Mcarthur Rossetti, MD   10 mg at 09/19/21 1025   carvedilol (COREG) tablet 6.25 mg  6.25 mg Oral BID WC Mcarthur Rossetti, MD   6.25 mg at 09/19/21 0848   Chlorhexidine Gluconate Cloth 2 % PADS 6 each  6 each Topical Daily Mcarthur Rossetti, MD   6 each at 09/19/21 0850   cholecalciferol (VITAMIN D3) tablet 2,000 Units  2,000 Units Oral Daily Mcarthur Rossetti, MD   2,000 Units at 09/19/21 0849   clopidogrel (PLAVIX) tablet 75 mg  75 mg Oral Daily Mcarthur Rossetti, MD   75 mg at 09/19/21 0846   diphenhydrAMINE (BENADRYL) 12.5 MG/5ML elixir 12.5-25 mg  12.5-25 mg Oral Q4H PRN Mcarthur Rossetti, MD       docusate sodium (COLACE) capsule 100 mg  100 mg Oral BID Mcarthur Rossetti, MD   100 mg at 09/19/21 0847   donepezil (ARICEPT) tablet 20 mg  20 mg Oral QHS Mcarthur Rossetti, MD   20 mg at 09/18/21 2125   DULoxetine (CYMBALTA) DR capsule 30 mg  30 mg Oral Daily Mcarthur Rossetti, MD   30 mg at 09/19/21 0848   gabapentin (NEURONTIN) capsule 800 mg  800 mg Oral TID Mcarthur Rossetti, MD   800 mg at 09/19/21 0848   HYDROmorphone (DILAUDID) injection 0.5-1 mg  0.5-1 mg Intravenous Q4H PRN Mcarthur Rossetti, MD   1 mg at 09/19/21 0848   lamoTRIgine (LAMICTAL) tablet 100 mg  100 mg Oral BID Mcarthur Rossetti, MD   100 mg at 09/19/21 0847   levothyroxine (SYNTHROID) tablet 88 mcg  88 mcg Oral Q0600 Mcarthur Rossetti, MD   88 mcg at 09/19/21 9528    menthol-cetylpyridinium (CEPACOL) lozenge 3 mg  1 lozenge Oral PRN Mcarthur Rossetti, MD       Or   phenol (CHLORASEPTIC) mouth spray 1 spray  1 spray Mouth/Throat PRN Mcarthur Rossetti, MD       metoCLOPramide (REGLAN) tablet 5-10 mg  5-10 mg Oral Q8H PRN Mcarthur Rossetti, MD       Or   metoCLOPramide (REGLAN) injection 5-10 mg  5-10 mg Intravenous Q8H PRN Mcarthur Rossetti, MD       mirabegron ER [facility]) tablet 50 mg  50 mg Oral Daily Mcarthur Rossetti, MD   50 mg at 09/19/21 0849   ondansetron (ZOFRAN) tablet 4 mg  4 mg Oral Q6H PRN Mcarthur Rossetti, MD  Or   ondansetron (ZOFRAN) injection 4 mg  4 mg Intravenous Q6H PRN Mcarthur Rossetti, MD       oxyCODONE (Oxy IR/ROXICODONE) immediate release tablet 10-15 mg  10-15 mg Oral Q4H PRN Mcarthur Rossetti, MD   15 mg at 09/19/21 1025   oxyCODONE (Oxy IR/ROXICODONE) immediate release tablet 5-10 mg  5-10 mg Oral Q4H PRN Mcarthur Rossetti, MD       pantoprazole (PROTONIX) EC tablet 40 mg  40 mg Oral Daily Mcarthur Rossetti, MD   40 mg at 09/19/21 0849   polyethylene glycol (MIRALAX / GLYCOLAX) packet 17 g  17 g Oral Daily Mcarthur Rossetti, MD   17 g at 09/19/21 0846   potassium chloride SA (KLOR-CON M) CR tablet 20 mEq  20 mEq Oral Angelica Chessman, MD   20 mEq at 09/19/21 0850   rOPINIRole (REQUIP) tablet 3 mg  3 mg Oral QID Mcarthur Rossetti, MD   3 mg at 09/19/21 0846   torsemide (DEMADEX) tablet 80 mg  80 mg Oral Daily Mcarthur Rossetti, MD   80 mg at 09/19/21 0848   venlafaxine XR (EFFEXOR-XR) 24 hr capsule 75 mg  75 mg Oral Q breakfast Mcarthur Rossetti, MD   75 mg at 09/19/21 8250     Discharge Medications: Please see discharge summary for a list of discharge medications.  Relevant Imaging Results:  Relevant Lab Results:   Additional Information ssn: [id number]  Coralee P[place], [place]

## 2021-09-19 NOTE — Plan of Care (Signed)

## 2021-09-19 NOTE — Progress Notes (Signed)
Physical Therapy Treatment Patient Details Name: [name] MRN: [id number] DOB: Jul 22, 1946 Today's Date: 09/19/2021   History of Present Illness 76 y.o. female presents to [facility] hospital on 09/18/2021 with failed previous arthroplasty of L knee. Pt underwent L total knee revision on 09/18/2021. PMH includes anxiety, OA, CKD, CHF, dementia, depression, HTN, HLD, OSA, DMII.    PT Comments    Pt received in supine, lethargic but agreeable to therapy session with encouragement. Pt able to perform AAROM supine/seated LLE exercises per HEP handout and reviewed LLE positioning and AA techniques with daughter. Pt needing increased assist up to +2 modA for transition to/from EOB but unsafe to attempt standing today due to pt lethargy after getting dilaudid medication. Encouraged use of iceman to prevent LLE pain and reduce need for pain meds that increase lethargy. Pt continues to benefit from PT services to progress toward functional mobility goals.   Recommendations for follow up therapy are one component of a multi-disciplinary discharge planning process, led by the attending physician.  Recommendations may be updated based on patient status, additional functional criteria and insurance authorization.  Follow Up Recommendations  Skilled nursing-short term rehab (<3 hours/day)     Assistance Recommended at Discharge Intermittent Supervision/Assistance  Patient can return home with the following A lot of help with walking and/or transfers;A lot of help with bathing/dressing/bathroom;Assist for transportation   Equipment Recommendations  Other (comment) (defer to post-acute)    Recommendations for Other Services       Precautions / Restrictions Precautions Precautions: Fall Required Braces or Orthoses: Knee Immobilizer - Left Knee Immobilizer - Left:  (pt unable to perform SLR so will maintain for OOB) Restrictions Weight Bearing Restrictions: Yes LLE Weight Bearing: Weight bearing as  tolerated     Mobility  Bed Mobility Overal bed mobility: Needs Assistance Bed Mobility: Supine to Sit, Sit to Supine, Rolling Rolling: Min assist   Supine to sit: HOB elevated, Mod assist, +2 for physical assistance Sit to supine: Mod assist, +2 for physical assistance   General bed mobility comments: increased assist due to level of pt lethargy. Pt needing trunk and BLE assist and repetitive cues for sequencing. Pt able to assist with moving BLE toward EOB.    Transfers   General transfer comment: pt too fatigued to safely attempt        Balance Overall balance assessment: Needs assistance Sitting-balance support: Bilateral upper extremity supported, Feet supported Sitting balance-Leahy Scale: Poor Sitting balance - Comments: pt with consistent posterior lean and falling asleep, needs max cues for self-assist and posture       Standing balance comment: defer for pt safety due to lethargy        Cognition Arousal/Alertness: Lethargic, Suspect due to medications   Overall Cognitive Status: Within Functional Limits for tasks assessed       General Comments: Patient had recently received dilaudid and was very drowsy, falling asleep sitting EOB. Unable to progress OOB due to lethargy. Pt daughter Vicente Males present and encouraging.        Exercises Total Joint Exercises Ankle Circles/Pumps: AROM, Both, 10 reps, Supine Quad Sets: AAROM, 5 reps, Left Heel Slides: AAROM, Left, 10 reps, Supine Hip ABduction/ADduction: AAROM, Left, 10 reps, Supine Long Arc Quad: AAROM, Left, 10 reps, Seated Goniometric ROM: 12 deg ext to 62 deg flexion in L knee in supine Other Exercises Other Exercises: seated LLE AAROM: hip flexion x10 reps    General Comments        Pertinent Vitals/Pain Pain  Assessment Pain Assessment: Faces Faces Pain Scale: Hurts little more Pain Location: L knee with flexion AAROM Pain Descriptors / Indicators: Grimacing, Discomfort Pain Intervention(s):  Limited activity within patient's tolerance, Monitored during session, Premedicated before session, Repositioned, Ice applied (iceman applied)     PT Goals (current goals can now be found in the care plan section) Acute Rehab PT Goals Patient Stated Goal: to regain L knee ROM and strength PT Goal Formulation: With patient/family Time For Goal Achievement: 10/02/21 Progress towards PT goals: Progressing toward goals    Frequency    7X/week      PT Plan Current plan remains appropriate       AM-PAC PT "6 Clicks" Mobility   Outcome Measure  Help needed turning from your back to your side while in a flat bed without using bedrails?: A Lot Help needed moving from lying on your back to sitting on the side of a flat bed without using bedrails?: A Lot Help needed moving to and from a bed to a chair (including a wheelchair)?: Total Help needed standing up from a chair using your arms (e.g., wheelchair or bedside chair)?: Total Help needed to walk in hospital room?: Total Help needed climbing 3-5 steps with a railing? : Total 6 Click Score: 8    End of Session   Activity Tolerance: Patient limited by lethargy Patient left: in bed;with call bell/phone within reach;with bed alarm set;with family/visitor present;Other (comment) (LLE heel floated, bed up in partial chair position to promote alertness, pt daughter present;iceman donned) Nurse Communication: Mobility status;Need for lift equipment;Other (comment) (pt lethargy) PT Visit Diagnosis: Other abnormalities of gait and mobility (R26.89);Muscle weakness (generalized) (M62.81);Pain Pain - Right/Left: Left Pain - part of body: Knee     Time: 2122-4825 PT Time Calculation (min) (ACUTE ONLY): 39 min  Charges:  $Therapeutic Exercise: 23-37 mins $Therapeutic Activity: 8-22 mins                     Nazareth Norenberg P., PTA Acute Rehabilitation Services Pager: [phone] Office: [place] 09/19/2021, 5:42 PM

## 2021-09-20 LAB — CBC
HCT: 28.2 % — ABNORMAL LOW (ref 36.0–46.0)
Hemoglobin: 9.3 g/dL — ABNORMAL LOW (ref 12.0–15.0)
MCH: 31.1 pg (ref 26.0–34.0)
MCHC: 33 g/dL (ref 30.0–36.0)
MCV: 94.3 fL (ref 80.0–100.0)
Platelets: 190 10*3/uL (ref 150–400)
RBC: 2.99 MIL/uL — ABNORMAL LOW (ref 3.87–5.11)
RDW: 14.6 % (ref 11.5–15.5)
WBC: 10.3 10*3/uL (ref 4.0–10.5)
nRBC: 0 % (ref 0.0–0.2)

## 2021-09-20 NOTE — Progress Notes (Signed)
Subjective: 2 Days Post-Op Procedure(s) (LRB): LEFT TOTAL KNEE REVISION (Left) Patient reports pain as moderate.  Acute on chronic blood loss anemia, but minimal drop from surgery and stable.  Vitals stable.  Working with therapy.  Transitional care is working on placement.  Objective: Vital signs in last 24 hours: Temp:  [98.9 F (37.2 C)-100.8 F (38.2 C)] 99.7 F (37.6 C) (01/28 0758) Pulse Rate:  [65-84] 66 (01/28 0758) Resp:  [16-18] 17 (01/28 0758) BP: (129-159)/(49-80) 131/51 (01/28 0758) SpO2:  [92 %-100 %] 100 % (01/28 0758)  Intake/Output from previous day: 01/27 0701 - 01/28 0700 In: -  Out: 2150 [Urine:2150] Intake/Output this shift: No intake/output data recorded.  Recent Labs    09/18/21 0547 09/19/21 0130 09/20/21 0214  HGB 10.7* 9.8* 9.3*   Recent Labs    09/19/21 0130 09/20/21 0214  WBC 11.3* 10.3  RBC 3.25* 2.99*  HCT 30.7* 28.2*  PLT 221 190   Recent Labs    09/18/21 0547  NA 141  K 3.9  CL 102  CO2 29  BUN 26*  CREATININE 1.18*  GLUCOSE 100*  CALCIUM 10.5*   No results for input(s): LABPT, INR in the last 72 hours.  Sensation intact distally Intact pulses distally Dorsiflexion/Plantar flexion intact Incision: dressing C/D/I No cellulitis present Compartment soft   Assessment/Plan: 2 Days Post-Op Procedure(s) (LRB): LEFT TOTAL KNEE REVISION (Left) Up with therapy Discharge to SNF likely Monday.      [name] 09/20/2021, 8:06 AM

## 2021-09-20 NOTE — Progress Notes (Addendum)
Physical Therapy Treatment Patient Details Name: [name] MRN: [id number] DOB: 23-Apr-1946 Today's Date: 09/20/2021   History of Present Illness 76 y.o. female presents to [facility] hospital on 09/18/2021 with failed previous arthroplasty of L knee. Pt underwent L total knee revision on 09/18/2021. PMH includes anxiety, OA, CKD, CHF, dementia, depression, HTN, HLD, OSA, DMII.    PT Comments    Pt supine in bed on arrival.  Pt continues to be drowsy due to pain medication which is limiting her progress. Plan remains appropriate for post acute rehab.  Will continue to follow to progress mobility as patient is able.     Recommendations for follow up therapy are one component of a multi-disciplinary discharge planning process, led by the attending physician.  Recommendations may be updated based on patient status, additional functional criteria and insurance authorization.  Follow Up Recommendations  Skilled nursing-short term rehab (<3 hours/day)     Assistance Recommended at Discharge Intermittent Supervision/Assistance  Patient can return home with the following A lot of help with walking and/or transfers;A lot of help with bathing/dressing/bathroom;Assist for transportation   Equipment Recommendations  Other (comment) (defer to post acute rehab.)    Recommendations for Other Services       Precautions / Restrictions Precautions Precautions: Fall Required Braces or Orthoses:  (did not use as she was in the sara stedy.) Restrictions Weight Bearing Restrictions: Yes LLE Weight Bearing: Weight bearing as tolerated     Mobility  Bed Mobility Overal bed mobility: Needs Assistance Bed Mobility: Supine to Sit     Supine to sit: HOB elevated, Mod assist     General bed mobility comments: Mod assistance to move LEs to edge of bed.  Pt able to raise trunk once supported.    Transfers Overall transfer level: Needs assistance Equipment used: Ambulation equipment used (sara  stedy.) Transfers: Sit to/from Stand Sit to Stand: From elevated surface, Max assist           General transfer comment: Pt with tremoring and noticeable buckling.  Required cues for glut and quad engagement.  B shoulders are very weak with noted poor ROM.  Performed x2 sit to stand with flexed posture.    Ambulation/Gait Ambulation/Gait assistance:  (unable has be non ambulatory for over a year.)                 Stairs             Wheelchair Mobility    Modified Rankin (Stroke Patients Only)       Balance Overall balance assessment: Needs assistance Sitting-balance support: Bilateral upper extremity supported, Feet supported Sitting balance-Leahy Scale: Poor     Standing balance support: Bilateral upper extremity supported, Reliant on assistive device for balance Standing balance-Leahy Scale: Poor Standing balance comment: defer for pt safety due to lethargy                            Cognition Arousal/Alertness: Lethargic, Suspect due to medications Behavior During Therapy: WFL for tasks assessed/performed Overall Cognitive Status: Within Functional Limits for tasks assessed                                 General Comments: Pt drowsy once again from receiving dilaudid.  She was intermittently falling asleep which is limiting therapeutic interventions.        Exercises Total Joint Exercises Goniometric ROM:  10-74 L knee.    General Comments        Pertinent Vitals/Pain Pain Assessment Faces Pain Scale: No hurt (just received dilaudid)    Home Living                          Prior Function            PT Goals (current goals can now be found in the care plan section) Acute Rehab PT Goals Patient Stated Goal: to regain L knee ROM and strength PT Goal Formulation: With patient/family Potential to Achieve Goals: Fair Progress towards PT goals: Progressing toward goals    Frequency     7X/week      PT Plan Current plan remains appropriate    Co-evaluation              AM-PAC PT "6 Clicks" Mobility   Outcome Measure  Help needed turning from your back to your side while in a flat bed without using bedrails?: A Lot Help needed moving from lying on your back to sitting on the side of a flat bed without using bedrails?: A Lot Help needed moving to and from a bed to a chair (including a wheelchair)?: Total Help needed standing up from a chair using your arms (e.g., wheelchair or bedside chair)?: Total Help needed to walk in hospital room?: Total Help needed climbing 3-5 steps with a railing? : Total 6 Click Score: 8    End of Session   Activity Tolerance: Patient limited by lethargy Patient left: with call bell/phone within reach;with family/visitor present;in chair;with chair alarm set;Other (comment) (lift pad under patient) Nurse Communication: Mobility status;Need for lift equipment PT Visit Diagnosis: Other abnormalities of gait and mobility (R26.89);Muscle weakness (generalized) (M62.81);Pain Pain - Right/Left: Left Pain - part of body: Knee     Time: 2956-2130 PT Time Calculation (min) (ACUTE ONLY): 31 min  Charges:  $Therapeutic Activity: 23-37 mins                     Erasmo Leventhal , PTA Acute Rehabilitation Services Pager [phone] Office [phone]    Haniyah Maciolek Eli Hose 09/20/2021, 1:04 PM

## 2021-09-20 NOTE — Discharge Instructions (Signed)

## 2021-09-21 MED ORDER — OXYCODONE HCL 5 MG PO TABS
5.0000 mg | ORAL_TABLET | ORAL | 0 refills | Status: DC | PRN
Start: 2021-09-21 — End: 2022-05-06

## 2021-09-21 NOTE — Progress Notes (Signed)
Physical Therapy Treatment Patient Details Name: [name] MRN: [id number] DOB: 1946-06-11 Today's Date: 09/21/2021   History of Present Illness 76 y.o. female presents to [facility] on 09/18/2021 with failed previous arthroplasty of L knee. Pt underwent L total knee revision on 09/18/2021. PMH includes anxiety, OA, CKD, CHF, dementia, depression, HTN, HLD, OSA, DMII.    PT Comments    Pt tolerates treatment well but is lethargic throughout and demonstrates altered mental status compared to baseline. Pt is able to participate in multiple transfer attempts, continuing to require significant physical assistance due to BLE weakness. Pt will benefit from continued acute PT services to reduce falls risk and caregiver burden.   Recommendations for follow up therapy are one component of a multi-disciplinary discharge planning process, led by the attending physician.  Recommendations may be updated based on patient status, additional functional criteria and insurance authorization.  Follow Up Recommendations  Skilled nursing-short term rehab (<3 hours/day)     Assistance Recommended at Discharge Intermittent Supervision/Assistance  Patient can return home with the following A lot of help with walking and/or transfers;A lot of help with bathing/dressing/bathroom;Assist for transportation   Equipment Recommendations   (defer to post-acute rehab)    Recommendations for Other Services       Precautions / Restrictions Precautions Precautions: Fall Required Braces or Orthoses: Knee Immobilizer - Left Knee Immobilizer - Left:  (no orders for KI in chart. PT maintains with attempts at standing this session) Restrictions Weight Bearing Restrictions: Yes LLE Weight Bearing: Weight bearing as tolerated     Mobility  Bed Mobility Overal bed mobility: Needs Assistance Bed Mobility: Supine to Sit, Sit to Supine     Supine to sit: Mod assist, HOB elevated Sit to supine: Total assist    General bed mobility comments: totalA to return to bed for safety as hips near edge of bed    Transfers Overall transfer level: Needs assistance Equipment used: 1 person hand held assist Transfers: Sit to/from Stand Sit to Stand: Max assist, From elevated surface           General transfer comment: pt performs 5 sit to stands during session, verbal and tactile cues to facilitate knee/hip/trunk extension. Pt is able to stand with good posture on 3/5 stand attempts    Ambulation/Gait                   Stairs             Wheelchair Mobility    Modified Rankin (Stroke Patients Only)       Balance Overall balance assessment: Needs assistance Sitting-balance support: Feet supported, Single extremity supported Sitting balance-Leahy Scale: Poor Sitting balance - Comments: min-modA, pt often leaning backwards and to left side Postural control: Posterior lean, Left lateral lean Standing balance support: Bilateral upper extremity supported Standing balance-Leahy Scale: Poor Standing balance comment: maxA with R knee block                            Cognition Arousal/Alertness: Lethargic, Suspect due to medications Behavior During Therapy: WFL for tasks assessed/performed Overall Cognitive Status: Impaired/Different from baseline Area of Impairment: Memory, Problem solving                     Memory: Decreased short-term memory       Problem Solving: Slow processing          Exercises      General  Comments General comments (skin integrity, edema, etc.): VSS on RA, pt lethargic      Pertinent Vitals/Pain Pain Assessment Pain Assessment: No/denies pain    Home Living                          Prior Function            PT Goals (current goals can now be found in the care plan section) Acute Rehab PT Goals Patient Stated Goal: to regain L knee ROM and strength Progress towards PT goals: Progressing toward goals  (very slowly)    Frequency    7X/week      PT Plan Current plan remains appropriate    Co-evaluation              AM-PAC PT "6 Clicks" Mobility   Outcome Measure  Help needed turning from your back to your side while in a flat bed without using bedrails?: A Lot Help needed moving from lying on your back to sitting on the side of a flat bed without using bedrails?: A Lot Help needed moving to and from a bed to a chair (including a wheelchair)?: Total Help needed standing up from a chair using your arms (e.g., wheelchair or bedside chair)?: A Lot Help needed to walk in hospital room?: Total Help needed climbing 3-5 steps with a railing? : Total 6 Click Score: 9    End of Session Equipment Utilized During Treatment: Left knee immobilizer Activity Tolerance: Patient limited by lethargy Patient left: in bed;with call bell/phone within reach;with family/visitor present Nurse Communication: Mobility status;Need for lift equipment PT Visit Diagnosis: Other abnormalities of gait and mobility (R26.89);Muscle weakness (generalized) (M62.81);Pain Pain - Right/Left: Left Pain - part of body: Knee     Time: 1438-8875 PT Time Calculation (min) (ACUTE ONLY): 30 min  Charges:  $Therapeutic Activity: 23-37 mins                     Zenaida Niece, PT, DPT Acute Rehabilitation Pager: [phone] Office [phone]    Zenaida Niece 09/21/2021, 12:42 PM

## 2021-09-21 NOTE — Progress Notes (Signed)
Patient ID: [name], female   DOB: [birth year], 76 y.o.   MRN: [id number] The patient is awake and alert this morning.  She is confused though.  Fortunately her daughter is at the bedside.  Her vital signs are stable.  Her left operative knee is stable as well.  The plan is to discharge to skilled nursing hopefully tomorrow.

## 2021-09-21 NOTE — Plan of Care (Signed)

## 2021-09-21 NOTE — Plan of Care (Signed)

## 2021-09-22 LAB — SARS CORONAVIRUS 2 (TAT 6-24 HRS): SARS Coronavirus 2: NEGATIVE

## 2021-09-22 NOTE — Plan of Care (Signed)
  Problem: Clinical Measurements: Goal: Respiratory complications will improve Outcome: Progressing   Problem: Clinical Measurements: Goal: Cardiovascular complication will be avoided Outcome: Progressing   Problem: Pain Managment: Goal: General experience of comfort will improve Outcome: Progressing   

## 2021-09-22 NOTE — Plan of Care (Signed)

## 2021-09-22 NOTE — Progress Notes (Signed)
1 mg of dilaudid was given as per EMR.  Pyxis notes a 0.5 mg dose needs to be wasted and that is incorrect.  No waste was needed.

## 2021-09-22 NOTE — TOC Progression Note (Signed)
Transition of Care [facility]) - Progression Note    Patient Details  Name: [name] MRN: [id number] Date of Birth: 04-04-1946  Transition of [facility]) CM/SW Contact  Joanne Chars, LCSW Phone Number: 09/22/2021, 11:59 AM  Clinical Narrative:   CSW spoke with bonnie at [facility] and rehab.  She can accept pt today, needs DC summary faxed to [phone].  No updated covid needed.  No auth needed.    Expected Discharge Plan: [place] Barriers to Discharge: SNF Pending bed offer, Continued Medical Work up  Expected Discharge Plan and Services Expected Discharge Plan: [place] Choice: [place] arrangements for the past 2 months: [place] Expected Discharge Date: 09/22/21                                     Social Determinants of Health (SDOH) Interventions    Readmission Risk Interventions No flowsheet data found.

## 2021-09-22 NOTE — TOC Transition Note (Addendum)
Transition of Care [facility]) - CM/SW Discharge Note   Patient Details  Name: [name] MRN: [id number] Date of Birth: Feb 14, 1946  Transition of Care [facility]) CM/SW Contact:  Joanne Chars, LCSW Phone Number: 09/22/2021, 12:02 PM   Clinical Narrative:   Pt discharging to [facility] and Rehab.  RN call report to [phone].    Final next level of care: Skilled Nursing Facility Barriers to Discharge: Barriers Resolved   Patient Goals and CMS Choice Patient states their goals for this hospitalization and ongoing recovery are:: Pt states she does not want to return to [facility]. CMS Medicare.gov Compare Post Acute Care list provided to:: Patient Represenative (must comment) Vicente Males) Choice offered to / list presented to : Adult Children  Discharge Placement              Patient chooses bed at:  [facility] and [place]) Patient to be transferred to facility by: [place] Name of family member notified: daughter Sharee Pimple in room. Patient and family notified of of transfer: 09/22/21  Discharge Plan and Services     Post Acute Care Choice: [place]                               Social Determinants of Health (SDOH) Interventions     Readmission Risk Interventions No flowsheet data found.

## 2021-09-22 NOTE — Care Management Important Message (Signed)
Important Message  Patient Details  Name: [name] MRN: [id number] Date of Birth: 1946-01-04   Medicare Important Message Given:  Yes     Hannah Beat 09/22/2021, 12:06 PM

## 2021-09-22 NOTE — Discharge Summary (Signed)
Patient ID: [name] MRN: [id number] DOB/AGE: 1946/01/07 76 y.o.  Admit date: 09/18/2021 Discharge date: 09/22/2021  Admission Diagnoses:  Principal Problem:   Failed total knee, left, subsequent encounter Active Problems:   Status post revision of total replacement of left knee   Discharge Diagnoses:  Same  Past Medical History:  Diagnosis Date   Anemia    Anxiety    Arthritis    "hands, feet" (03/03/2017)   Brain lesion    2 types   Cataract    Cervical spondylosis with myelopathy    Chest pain    Normal cardiac cath 5/09   Chronic lower back pain    CKD (chronic kidney disease), stage III (HCC)    stage 3   Congestive heart failure (CHF) (HCC)    has a Cardiomems implant    Constipation    Dementia ([place])    Depression    Dumping syndrome    Dyspnea    with activity Hx - no longer a problem per DON at SNF on 07/21/21   Edema    Fatigue    Fibromyalgia    GERD (gastroesophageal reflux disease)    Headache    "w/high CBG" (03/03/2017)   History of echocardiogram    a. Echo 03/20/16 (done at [facility] in [place], [place]):  mild LVH, EF 64%, normal diastolic function, mild LAE, MAC, RVSP 25 mmHg   Hyperlipidemia    Hypertension    Hypothyroidism    Lumbar spondylosis    Lymphedema    seeing specialist for this   Migraine    "mostly stopped when I changed my diet" (03/03/2017)   OAB (overactive bladder)    OSA on CPAP    does not use cpap   Pneumonia X 1   PONV (postoperative nausea and vomiting)    Restless leg syndrome    Rheumatoid arthritis ([place])    Stroke ([place])    TIAs "mini strokes"- unsure of last TIA - pt and daughter deny this   Syncope    Tremors of nervous system    Type II diabetes mellitus ([place])    no meds, diet controlled    Surgeries: Procedure(s): LEFT TOTAL KNEE REVISION on 09/18/2021   Consultants:   Discharged Condition: Improved  Hospital Course: INDYAH SAULNIER is an 76 y.o. female who was admitted 09/18/2021 for  operative treatment ofFailed total knee, left, subsequent encounter. Patient has severe unremitting pain that affects sleep, daily activities, and work/hobbies. After pre-op clearance the patient was taken to the operating room on 09/18/2021 and underwent  Procedure(s): LEFT TOTAL KNEE REVISION.    Patient was given perioperative antibiotics:  Anti-infectives (From admission, onward)    Start     Dose/Rate Route Frequency Ordered Stop   09/18/21 1400  ceFAZolin (ANCEF) IVPB 1 g/50 mL premix        1 g 100 mL/hr over 30 Minutes Intravenous Every 6 hours 09/18/21 1014 09/18/21 2019   09/18/21 0600  ceFAZolin (ANCEF) IVPB 2g/100 mL premix        2 g 200 mL/hr over 30 Minutes Intravenous On call to O.R. 09/18/21 0541 09/18/21 0809        Patient was given sequential compression devices, early ambulation, and chemoprophylaxis to prevent DVT.  Patient benefited maximally from hospital stay and there were no complications.    Recent vital signs: Patient Vitals for the past 24 hrs:  BP Temp Temp src Pulse Resp SpO2  09/22/21 0337 (!) 116/48 98.6 F (  37 C) Oral 66 20 100 %  09/21/21 1941 (!) 106/41 98.4 F (36.9 C) Oral 66 16 99 %  09/21/21 0800 (!) 118/95 98 F (36.7 C) Oral 71 18 98 %     Recent laboratory studies:  Recent Labs    09/20/21 0214  WBC 10.3  HGB 9.3*  HCT 28.2*  PLT 190     Discharge Medications:   Allergies as of 09/22/2021   No Known Allergies      Medication List     STOP taking these medications    HYDROcodone-acetaminophen 7.5-325 MG tablet Commonly known as: NORCO       TAKE these medications    acetaminophen 325 MG tablet Commonly known as: TYLENOL Take 650 mg by mouth every 4 (four) hours as needed (Pain).   albuterol 108 (90 Base) MCG/ACT inhaler Commonly known as: VENTOLIN HFA Inhale 2 puffs into the lungs every 4 (four) hours as needed for wheezing.   amLODipine 2.5 MG tablet Commonly known as: NORVASC Take 2.5 mg by mouth  daily.   aspirin EC 81 MG tablet Take 1 tablet (81 mg total) by mouth daily.   atorvastatin 40 MG tablet Commonly known as: LIPITOR Take 40 mg by mouth every evening.   baclofen 10 MG tablet Commonly known as: LIORESAL Take 10 mg by mouth 3 (three) times daily as needed for muscle spasms.   budesonide-formoterol 160-4.5 MCG/ACT inhaler Commonly known as: SYMBICORT Inhale 2 puffs into the lungs in the morning and at bedtime.   carvedilol 6.25 MG tablet Commonly known as: COREG Take 6.25 mg by mouth 2 (two) times daily with a meal.   clopidogrel 75 MG tablet Commonly known as: PLAVIX Take 75 mg by mouth daily.   desvenlafaxine 50 MG 24 hr tablet Commonly known as: PRISTIQ Take 50 mg by mouth daily.   docusate sodium 100 MG capsule Commonly known as: COLACE Take 100 mg by mouth 2 (two) times daily.   donepezil 10 MG tablet Commonly known as: ARICEPT Take 20 mg by mouth at bedtime.   DULoxetine 60 MG capsule Commonly known as: CYMBALTA Take 60 mg by mouth daily. Cross taper to Pristiq, only take dose for 1 week (stated 09/06/21)   DULoxetine 30 MG capsule Commonly known as: CYMBALTA Take 30 mg by mouth daily.   gabapentin 800 MG tablet Commonly known as: NEURONTIN Take 800 mg by mouth 3 (three) times daily.   Klor-Con M20 20 MEQ tablet Generic drug: potassium chloride SA Take 20 mEq by mouth every other day.   lamoTRIgine 100 MG tablet Commonly known as: LAMICTAL Take 100 mg by mouth 2 (two) times daily. (0900 & 2100)   levothyroxine 88 MCG tablet Commonly known as: SYNTHROID Take 88 mcg by mouth daily before breakfast. (0600)   lidocaine 5 % Commonly known as: LIDODERM Place 1 patch onto the skin daily. Remove patch at 1900   mometasone 50 MCG/ACT nasal spray Commonly known as: NASONEX Place 1 spray into the nose daily.   Myrbetriq 50 MG Tb24 tablet Generic drug: mirabegron ER Take 50 mg by mouth daily.   NON FORMULARY at bedtime. Cpap    omeprazole 20 MG capsule Commonly known as: PRILOSEC Take 20 mg by mouth 2 (two) times daily before a meal. (0900 & 1700)   oxyCODONE 5 MG immediate release tablet Commonly known as: Oxy IR/ROXICODONE Take 1-2 tablets (5-10 mg total) by mouth every 4 (four) hours as needed for moderate pain (pain score 4-6).  OXYGEN Inhale 2 L into the lungs See admin instructions. At bedtime and prn for shortness of breath   polyethylene glycol 17 g packet Commonly known as: MIRALAX / GLYCOLAX Take 17 g by mouth daily. 0600 Mix in 4-8 oz of Fluid of choice   Preparation H 0.25-14-74.9 % rectal ointment Generic drug: phenylephrine-shark liver oil-mineral oil-petrolatum Place 1 application rectally 4 (four) times daily as needed for hemorrhoids.   promethazine 12.5 MG tablet Commonly known as: PHENERGAN Take 12.5 mg by mouth 3 (three) times daily as needed for nausea.   rOPINIRole 3 MG tablet Commonly known as: REQUIP Take 3 mg by mouth 4 (four) times daily.   sennosides-docusate sodium 8.6-50 MG tablet Commonly known as: SENOKOT-S Take 1 tablet by mouth daily.   torsemide 20 MG tablet Commonly known as: DEMADEX Take 80 mg by mouth daily.   traZODone 100 MG tablet Commonly known as: DESYREL Take 100 mg by mouth at bedtime.   traZODone 50 MG tablet Commonly known as: DESYREL Take 25 mg by mouth at bedtime.   VITAMIN B-12 IJ Inject 1,000 mcg as directed every 30 (thirty) days. 24th of the month   vitamin C 500 MG tablet Commonly known as: ASCORBIC ACID Take 1,000 mg by mouth daily.   Vitamin D3 50 MCG (2000 UT) Tabs Take 2,000 Units by mouth daily. (0900)        Diagnostic Studies: DG Knee Left Port  Result Date: 09/18/2021 CLINICAL DATA:  Status post left knee replacement. EXAM: PORTABLE LEFT KNEE - 1-2 VIEW COMPARISON:  01/22/2021 FINDINGS: The previously demonstrated left knee prosthesis has been replaced with new components in satisfactory position and alignment. No  fracture or dislocation seen. IMPRESSION: Satisfactory postoperative appearance of a left total knee prosthesis. Electronically Signed   By: Claudie Revering M.D.   On: 09/18/2021 13:20    Disposition: Discharge disposition: 03-Skilled [place]     Mcarthur Rossetti, MD Follow up in 2 week(s).   Specialty: Orthopedic Surgery Contact information: [address] [place] [address] [phone]                  Signed: Mcarthur Rossetti 09/22/2021, 7:47 AM

## 2021-09-22 NOTE — Progress Notes (Signed)
Subjective: 4 Days Post-Op Procedure(s) (LRB): LEFT TOTAL KNEE REVISION (Left) Patient reports pain as moderate.  She has had limited mobility secondary to her pain as well as her obesity and confusion as well.  Fortunately her daughter has been at the bedside.  The transitional care team is helping with placement.  Objective: Vital signs in last 24 hours: Temp:  [98 F (36.7 C)-98.6 F (37 C)] 98.6 F (37 C) (01/30 0337) Pulse Rate:  [66-71] 66 (01/30 0337) Resp:  [16-20] 20 (01/30 0337) BP: (106-118)/(41-95) 116/48 (01/30 0337) SpO2:  [98 %-100 %] 100 % (01/30 0337)  Intake/Output from previous day: No intake/output data recorded. Intake/Output this shift: No intake/output data recorded.  Recent Labs    09/20/21 0214  HGB 9.3*   Recent Labs    09/20/21 0214  WBC 10.3  RBC 2.99*  HCT 28.2*  PLT 190   No results for input(s): NA, K, CL, CO2, BUN, CREATININE, GLUCOSE, CALCIUM in the last 72 hours. No results for input(s): LABPT, INR in the last 72 hours.  Sensation intact distally Intact pulses distally Dorsiflexion/Plantar flexion intact Incision: scant drainage Compartment soft   Assessment/Plan: 4 Days Post-Op Procedure(s) (LRB): LEFT TOTAL KNEE REVISION (Left) Up with therapy Discharge to SNF      Mcarthur Rossetti 09/22/2021, 7:46 AM

## 2021-10-02 ENCOUNTER — Encounter: Payer: Medicare Other | Admitting: Orthopaedic Surgery

## 2021-10-07 ENCOUNTER — Ambulatory Visit (INDEPENDENT_AMBULATORY_CARE_PROVIDER_SITE_OTHER): Payer: Medicaid Other | Admitting: Orthopaedic Surgery

## 2021-10-07 ENCOUNTER — Encounter: Payer: Self-pay | Admitting: Orthopaedic Surgery

## 2021-10-07 ENCOUNTER — Other Ambulatory Visit: Payer: Self-pay

## 2021-10-07 DIAGNOSIS — Z96652 Presence of left artificial knee joint: Secondary | ICD-10-CM

## 2021-10-07 NOTE — Progress Notes (Signed)
The patient is just over 2-1/2 weeks status post a left knee revision arthroplasty from a failed total knee replacement.  She is 76 years old and does stay at a skilled nursing facility.  She has stood but they are worried about her balance and coordination from a therapy standpoint so the therapist have been very cautious with her mobility.  She says that she is doing well overall and is very pleased with her surgery.  Her left knee incision looks good.  The staples been removed and Steri-Strips applied.  Her knee feels stable on exam.  She only needs to be up with assistance due to her fall risk.  I would like to see her back in 4 weeks.  At that visit I would like an AP and lateral of her left knee but this can be done supine since she does not stand well.

## 2021-11-04 ENCOUNTER — Ambulatory Visit (INDEPENDENT_AMBULATORY_CARE_PROVIDER_SITE_OTHER): Payer: Medicare Other

## 2021-11-04 ENCOUNTER — Ambulatory Visit (INDEPENDENT_AMBULATORY_CARE_PROVIDER_SITE_OTHER): Payer: Medicare Other | Admitting: Physician Assistant

## 2021-11-04 ENCOUNTER — Encounter: Payer: Self-pay | Admitting: Physician Assistant

## 2021-11-04 DIAGNOSIS — Z96652 Presence of left artificial knee joint: Secondary | ICD-10-CM

## 2021-11-04 NOTE — Progress Notes (Signed)
HPI: [name] returns today for follow-up of her left knee status post revision.  She still having difficulty ambulating due to deconditioning.  She is in a skilled nursing facility.  She states her pain is 8 out of 10 at worst with walking.  She is on chronic aspirin.  She feels like she is making slow progress. ? ?Physical exam: Left knee she has full extension and flexion is only limited due to her thigh circumference.  No gross instability valgus varus stressing.  No abnormal warmth erythema.  Calf supple nontender. ? ?Radiographs: Left knee 2 views shows well-seated longstem components.  No complicating features.  Knee is well located. ? ?Impression: Status post left knee revision 09/18/2021. ? ?Plan: Recommend she continue to work on range of motion strengthening the knee.  Tickly quad strengthening due to her deconditioning due to the fact that she was sedentary for such a long time prior to having the revision.  She also needs to be seen at lymphedema clinic in [place] for bilateral lower extremity lymphedema.  She will follow-up with [place] in 6 weeks sooner if there is any questions or concerns. ?

## 2021-11-28 ENCOUNTER — Telehealth (HOSPITAL_COMMUNITY): Payer: Self-pay | Admitting: *Deleted

## 2021-11-28 NOTE — Progress Notes (Incomplete)
?Advanced Heart Failure Clinic Note  ? ?Primary Care: Dr. Edrick Oh ?Renal: Dr Joelyn Oms ?HF Cardiology: Dr. Aundra Dubin ? ?HPI: ?[name] is a 76 y.o. with history of chronic diastolic CHF (Echo 0/93/23 LVEF 65%, Mild LAE), chronic lymphedema, chronic back pain, HTN, CKD stage 3, and fibromyalgia.   ? ?Admitted several times in 02/2016 to [facility] with CHF and chest pain.  Has been tried on spironolactone + metolazone alone as diuretic regimen. Follows with renal as above.  ? ?She was admitted in 9/17 with chest pain and AKI.  She had seen nephrology and was given metolazone to take daily along with Lasix, creatinine rose considerably.  Lasix was decreased to 40 mg bid.  When creatinine improved, she had right and left heart cath showing nonobstructive CAD and near-normal filling pressures.  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   ? ?Had Cardiomems placed on 11/13/16. PA diastolic 9 mmHg at implant time.  ? ?Most recent echo in 7/22 showed EF 60-65%, normal RV.  ? ?She was admitted in 7/22 with acute metabolic encephalopathy, thought to be due to UTI.  ? ?She presents today for followup of diastolic CHF.  She remains in SNF.  She has history of left knee THR, this has degenerated and she is in need of redo surgery.  She is doing minimal walking due to left knee pain, mainly bed-bound. No dyspnea or chest pain at rest.   ? ?Cardiomems: PADP 5 mmHg ? ?ECG (personally reviewed): NSR, LAFB ? ?Labs (9/17): K 4.6, creatinine 1.55, BNP 63 ?Labs (10/17): K 4.7, creatinine 1.8 ?Labs ( 09/06/2016): K 3.7 Creatinine 2.05  ?Labs (1/18): K 5.1, creatinine 2.03 ?Labs (2/18): hgb 12, K 4.3, creatinine 1.87 ?Labs (11/13/2016) K 3.8 Creatinine 2.12  ?Labs (10/18): K 3.7, creatinine 1.6 ?Labs (1/19): K 4, creatinine 1.46 ?Labs (7/22): K 3.8, creatinine 1.04.  ? ?Review of systems complete and found to be negative unless listed in HPI.   ? ?Past Medical History ?1. Chronic diastolic CHF ?- Echo  5/57/32 LVEF 65%, Mild LAE ?- RHC (10/17): mean RA 4, PA 25/11, mean PCWP 5, CI 2.4.  ?- Echo (2/18): EF 55-60%, mild LVH, normal RV size and systolic function. ?- Cardiomems implanted 3/18.  ?- Echo (7/22): EF 60-65%, normal RV, IVC normal ?2. CKD stage 3: Follows with nephology. Baseline appears to be creatinine 1.4-1.6.  ?3. Chronic lymphedema: Has had wraps.  ?4. HTN ?5. Dementia ?6. Fibromyalgia ?7. Microvascular angina:  ?- Normal coronaries on Cath 2009 ?- Stress Myoview 04/09/16 with no ischemia ?- LHC (10/17) with mild nonobstructive disease.  ?8. Restless leg syndrome ?9. H/o shoulder replacement.  ?10. OSA: CPAP.  ? ?FH: Mother with CHF, brother with "heart disease."  Another brother with h/o MI.  ? ?Current Outpatient Medications  ?Medication Sig Dispense Refill  ? acetaminophen (TYLENOL) 325 MG tablet Take 650 mg by mouth every 4 (four) hours as needed (Pain).    ? albuterol (VENTOLIN HFA) 108 (90 Base) MCG/ACT inhaler Inhale 2 puffs into the lungs every 4 (four) hours as needed for wheezing.    ? amLODipine (NORVASC) 2.5 MG tablet Take 2.5 mg by mouth daily.    ? aspirin EC 81 MG tablet Take 1 tablet (81 mg total) by mouth daily. 90 tablet 3  ? atorvastatin (LIPITOR) 40 MG tablet Take 40 mg by mouth every evening.    ? baclofen (LIORESAL) 10 MG tablet Take 10 mg by mouth 3 (three) times daily  as needed for muscle spasms.    ? budesonide-formoterol (SYMBICORT) 160-4.5 MCG/ACT inhaler Inhale 2 puffs into the lungs in the morning and at bedtime.    ? carvedilol (COREG) 6.25 MG tablet Take 6.25 mg by mouth 2 (two) times daily with a meal.    ? Cholecalciferol (VITAMIN D3) 50 MCG (2000 UT) TABS Take 2,000 Units by mouth daily. (0900)    ? clopidogrel (PLAVIX) 75 MG tablet Take 75 mg by mouth daily.    ? Cyanocobalamin (VITAMIN B-12 IJ) Inject 1,000 mcg as directed every 30 (thirty) days. 24th of the month    ? desvenlafaxine (PRISTIQ) 50 MG 24 hr tablet Take 50 mg by mouth daily.    ? docusate sodium  (COLACE) 100 MG capsule Take 100 mg by mouth 2 (two) times daily.    ? donepezil (ARICEPT) 10 MG tablet Take 20 mg by mouth at bedtime.    ? DULoxetine (CYMBALTA) 30 MG capsule Take 30 mg by mouth daily.    ? DULoxetine (CYMBALTA) 60 MG capsule Take 60 mg by mouth daily. Cross taper to Pristiq, only take dose for 1 week (stated 09/06/21)    ? gabapentin (NEURONTIN) 800 MG tablet Take 800 mg by mouth 3 (three) times daily.    ? KLOR-CON M20 20 MEQ tablet Take 20 mEq by mouth every other day.    ? lamoTRIgine (LAMICTAL) 100 MG tablet Take 100 mg by mouth 2 (two) times daily. (0900 & 2100)    ? levothyroxine (SYNTHROID, LEVOTHROID) 88 MCG tablet Take 88 mcg by mouth daily before breakfast. (0600)    ? lidocaine (LIDODERM) 5 % Place 1 patch onto the skin daily. Remove patch at 1900    ? mometasone (NASONEX) 50 MCG/ACT nasal spray Place 1 spray into the nose daily.    ? MYRBETRIQ 50 MG TB24 tablet Take 50 mg by mouth daily.    ? NON FORMULARY at bedtime. Cpap    ? omeprazole (PRILOSEC) 20 MG capsule Take 20 mg by mouth 2 (two) times daily before a meal. (0900 & 1700)    ? oxyCODONE (OXY IR/ROXICODONE) 5 MG immediate release tablet Take 1-2 tablets (5-10 mg total) by mouth every 4 (four) hours as needed for moderate pain (pain score 4-6). 30 tablet 0  ? OXYGEN Inhale 2 L into the lungs See admin instructions. At bedtime and prn for shortness of breath    ? phenylephrine-shark liver oil-mineral oil-petrolatum (PREPARATION H) 0.25-14-74.9 % rectal ointment Place 1 application rectally 4 (four) times daily as needed for hemorrhoids.    ? polyethylene glycol (MIRALAX / GLYCOLAX) 17 g packet Take 17 g by mouth daily. 0600 Mix in 4-8 oz of Fluid of choice    ? promethazine (PHENERGAN) 12.5 MG tablet Take 12.5 mg by mouth 3 (three) times daily as needed for nausea.    ? rOPINIRole (REQUIP) 3 MG tablet Take 3 mg by mouth 4 (four) times daily.    ? sennosides-docusate sodium (SENOKOT-S) 8.6-50 MG tablet Take 1 tablet by mouth  daily.    ? torsemide (DEMADEX) 20 MG tablet Take 80 mg by mouth daily.    ? traZODone (DESYREL) 100 MG tablet Take 100 mg by mouth at bedtime.    ? traZODone (DESYREL) 50 MG tablet Take 25 mg by mouth at bedtime.    ? vitamin C (ASCORBIC ACID) 500 MG tablet Take 1,000 mg by mouth daily.    ? ?No current facility-administered medications for this visit.  ? ?No Known  Allergies ? ?Social History  ? ?Socioeconomic History  ? Marital status: Widowed  ?  Spouse name: Not on file  ? Number of children: 3  ? Years of education: Not on file  ? Highest education level: Not on file  ?Occupational History  ? Occupation: retired  ?Tobacco Use  ? Smoking status: Never  ? Smokeless tobacco: Never  ?Vaping Use  ? Vaping Use: Never used  ?Substance and Sexual Activity  ? Alcohol use: No  ?  Alcohol/week: 0.0 standard drinks  ? Drug use: No  ? Sexual activity: Not Currently  ?  Birth control/protection: Surgical, Post-menopausal  ?  Comment: Hysterectomy  ?Other Topics Concern  ? Not on file  ?Social History Narrative  ? Pt does use caffeine. Lives alone. 3 children. Retired.  ? ?Social Determinants of Health  ? ?Financial Resource Strain: Not on file  ?Food Insecurity: Not on file  ?Transportation Needs: Not on file  ?Physical Activity: Not on file  ?Stress: Not on file  ?Social Connections: Not on file  ?Intimate Partner Violence: Not on file  ? ?There were no vitals filed for this visit. ? ?Wt Readings from Last 3 Encounters:  ?09/18/21 99.1 kg (218 lb 7.6 oz)  ?06/17/21 96.2 kg (212 lb)  ?05/20/21 96.2 kg (212 lb)  ? ?PHYSICAL EXAM: ?General: NAD ?Neck: No JVD, no thyromegaly or thyroid nodule.  ?Lungs: Clear to auscultation bilaterally with normal respiratory effort. ?CV: Nondisplaced PMI.  Heart regular S1/S2, no S3/S4, no murmur.  Bilateral lower extremity lymphedema  No carotid bruit.  Normal pedal pulses.  ?Abdomen: Soft, nontender, no hepatosplenomegaly, no distention.  ?Skin: Intact without lesions or rashes.   ?Neurologic: Alert and oriented x 3.  ?Psych: Normal affect. ?Extremities: No clubbing or cyanosis.  ?HEENT: Normal.  ? ?ASSESSMENT & PLAN: ?1. Chronic diastolic CHF:  Echo (4/13) with EF 55-60%, normal RV, echo (7/22) wit

## 2021-11-28 NOTE — Telephone Encounter (Signed)
[name] with [facility] called to report pts weight is up 10lbs since 4/4. They asked for [place] to see pt in clinic. Appt scheduled for Monday 4/10 at 3:00pm ?

## 2021-12-01 ENCOUNTER — Inpatient Hospital Stay (HOSPITAL_COMMUNITY): Admission: RE | Admit: 2021-12-01 | Payer: Medicaid Other | Source: Ambulatory Visit

## 2021-12-17 ENCOUNTER — Encounter: Payer: Self-pay | Admitting: Orthopaedic Surgery

## 2021-12-17 ENCOUNTER — Ambulatory Visit (INDEPENDENT_AMBULATORY_CARE_PROVIDER_SITE_OTHER): Payer: Medicare Other | Admitting: Orthopaedic Surgery

## 2021-12-17 DIAGNOSIS — Z96652 Presence of left artificial knee joint: Secondary | ICD-10-CM

## 2021-12-17 NOTE — Progress Notes (Signed)
? ?  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  She has had a series of falls since surgery.  She is 76 years old and poorly conditioned.  She was hospitalized last weekend for a significant buildup of fluid in her body and they took a lot of fluid off of her body as well.  She stays in a nursing facility.  She is frustrated that the knee still hurts and she felt like she was getting around better before but she has had significant medical issues. ? ?I am able to easily flex and extend her left operative knee today.  There is no bruising or swelling around it.  It is located. ? ?Therapy can continue to work on her balance and coordination of mobility.  The next time I need to see her back is not for 3 months.  We will have an AP and lateral of her left knee at that visit.  I would also like a sunrise view. ? ? ? ? ? ? ? ? ? ? ? ? ? ? ? ? ? ? ? ? ? ? ? ? ? ? ? ? ? ? ? ? ? ? ? ? ? ? ? ? ? ? ? ? ? ? ? ? ? ? ? ? ? ? ? ? ? ? ? ? ? ? ? ? ? ? ? ? ? ? ? ? ? ? ? ? ? ? ? ? ? ? ? ? ? ? ? ? ? ? ? ? ? ? ? ? ? ? ? ? ? ? ? ? ? ? ? ? ? ? ? ? ? ? ? ? ? ? ? ? ? ? ? ? ? ? ? ? ? ? ? ? ? ? ? ? ? ? ? ? ? ? ? ? ? ? ? ? ? ? ? ? ? ? ? ? ? ? ? ? ? ? ? ? ? ? ? ? ? ? ? ? ? ? ? ? ? ? ? ? ? ? ? ? ? ? ? ? ? ? ? ? ? ? ? ? ? ? ? ? ? ? ? ? ? ? ? ? ? ? ? ? ? ? ? ? ? ? ? ? ? ? ? ? ?

## 2021-12-24 ENCOUNTER — Ambulatory Visit (HOSPITAL_COMMUNITY)
Admission: RE | Admit: 2021-12-24 | Discharge: 2021-12-24 | Disposition: A | Discharge status: Home / Self Care | Payer: Medicare Other | Source: Ambulatory Visit | Attending: Family Medicine | Admitting: Family Medicine

## 2021-12-24 ENCOUNTER — Encounter (HOSPITAL_COMMUNITY): Payer: Self-pay

## 2021-12-24 VITALS — BP 154/58 | HR 64 | Wt 214.0 lb

## 2021-12-24 DIAGNOSIS — I5032 Chronic diastolic (congestive) heart failure: Secondary | ICD-10-CM

## 2021-12-24 DIAGNOSIS — I10 Essential (primary) hypertension: Secondary | ICD-10-CM | POA: Diagnosis not present

## 2021-12-24 DIAGNOSIS — R5381 Other malaise: Secondary | ICD-10-CM

## 2021-12-24 DIAGNOSIS — I2510 Atherosclerotic heart disease of native coronary artery without angina pectoris: Secondary | ICD-10-CM | POA: Diagnosis not present

## 2021-12-24 DIAGNOSIS — G8929 Other chronic pain: Secondary | ICD-10-CM | POA: Diagnosis not present

## 2021-12-24 DIAGNOSIS — M797 Fibromyalgia: Secondary | ICD-10-CM | POA: Diagnosis not present

## 2021-12-24 DIAGNOSIS — I130 Hypertensive heart and chronic kidney disease with heart failure and stage 1 through stage 4 chronic kidney disease, or unspecified chronic kidney disease: Secondary | ICD-10-CM | POA: Insufficient documentation

## 2021-12-24 DIAGNOSIS — R42 Dizziness and giddiness: Secondary | ICD-10-CM | POA: Diagnosis present

## 2021-12-24 DIAGNOSIS — Z79899 Other long term (current) drug therapy: Secondary | ICD-10-CM | POA: Diagnosis not present

## 2021-12-24 DIAGNOSIS — I890 Lymphedema, not elsewhere classified: Secondary | ICD-10-CM | POA: Diagnosis not present

## 2021-12-24 DIAGNOSIS — N183 Chronic kidney disease, stage 3 unspecified: Secondary | ICD-10-CM

## 2021-12-24 DIAGNOSIS — Z7901 Long term (current) use of anticoagulants: Secondary | ICD-10-CM | POA: Diagnosis not present

## 2021-12-24 LAB — BASIC METABOLIC PANEL
Anion gap: 8 (ref 5–15)
BUN: 18 mg/dL (ref 8–23)
CO2: 29 mmol/L (ref 22–32)
Calcium: 10.3 mg/dL (ref 8.9–10.3)
Chloride: 103 mmol/L (ref 98–111)
Creatinine, Ser: 1.22 mg/dL — ABNORMAL HIGH (ref 0.44–1.00)
GFR, Estimated: 46 mL/min — ABNORMAL LOW (ref >60)
Glucose, Bld: 103 mg/dL — ABNORMAL HIGH (ref 70–99)
Potassium: 4.4 mmol/L (ref 3.5–5.1)
Sodium: 140 mmol/L (ref 135–145)

## 2021-12-24 LAB — BRAIN NATRIURETIC PEPTIDE: B Natriuretic Peptide: 39 pg/mL (ref 0.0–100.0)

## 2021-12-24 MED ORDER — TORSEMIDE 20 MG PO TABS: ORAL_TABLET | ORAL | 2 refills | Status: DC

## 2021-12-24 NOTE — Patient Instructions (Addendum)
EKG done today. ? ?RedsClip done today. Result:39%  ? ?Labs done today. We will contact you only if your labs are abnormal. ? ?INCREASE Torsemide to '40mg'$  (2 tablets) by mouth daily alternating with '20mg'$  (1 tablet) by mouth daily.  ? ?No other medication changes were made. Please continue all current medications as prescribed. ? ?PLEASE USE YOUR CARDIOMEMS PILLOW DAILY.  ? ?Your physician recommends that you schedule a follow-up appointment in: 3-4 weeks with our NP/PA clinic and in 3-4 months with Dr.McLean.  ? ?If you have any questions or concerns before your next appointment please send [place] a message through [place] or call our office at [phone].   ? ?TO LEAVE A MESSAGE FOR THE NURSE SELECT OPTION 2, PLEASE LEAVE A MESSAGE INCLUDING: ?YOUR NAME ?DATE OF BIRTH ?CALL BACK NUMBER ?REASON FOR CALL**this is important as we prioritize the call backs ? ?YOU WILL RECEIVE A CALL BACK THE SAME DAY AS LONG AS YOU CALL BEFORE 4:00 PM ? ? ?Do the following things EVERYDAY: ?Weigh yourself in the morning before breakfast. Write it down and keep it in a log. ?Take your medicines as prescribed ?Eat low salt foods--Limit salt (sodium) to 2000 mg per day.  ?Stay as active as you can everyday ?Limit all fluids for the day to less than 2 liters ? ? ?At the [facility], you and your health needs are our priority. As part of our continuing mission to provide you with exceptional heart care, we have created designated Provider Care Teams. These Care Teams include your primary Cardiologist (physician) and Advanced Practice Providers (APPs- Physician Assistants and Nurse Practitioners) who all work together to provide you with the care you need, when you need it.  ? ?You may see any of the following providers on your designated Care Team at your next follow up: ?Dr Glori Bickers ?Dr Loralie Champagne ?Darrick Grinder, NP ?Lyda Jester, PA ?Audry Riles, PharmD ? ? ?Please be sure to bring in all your medications  bottles to every appointment.  ? ?

## 2021-12-24 NOTE — Progress Notes (Signed)
ReDS Vest / Clip - 12/24/21 1400   ? ?  ? ReDS Vest / Clip  ? Station Marker A   ? Ruler Value 25   ? ReDS Value Range Moderate volume overload   ? ReDS Actual Value 39   ? ?  ?  ? ?  ? ? ?

## 2021-12-24 NOTE — Progress Notes (Signed)
?Advanced Heart Failure Clinic Note  ? ?Primary Care: Dr. Edrick Oh ?Renal: Dr Joelyn Oms ?HF Cardiology: Dr. Aundra Dubin ? ?HPI: ?[name] is a 76 y.o. with history of chronic diastolic CHF (Echo 9/37/90 LVEF 65%, Mild LAE), chronic lymphedema, chronic back pain, HTN, CKD stage 3, and fibromyalgia.   ? ?Admitted several times in 02/2016 to [facility] with CHF and chest pain.  Has been tried on spironolactone + metolazone alone as diuretic regimen. Follows with renal as above.  ? ?She was admitted in 9/17 with chest pain and AKI.  She had seen nephrology and was given metolazone to take daily along with Lasix, creatinine rose considerably.  Lasix was decreased to 40 mg bid.  When creatinine improved, she had right and left heart cath showing nonobstructive CAD and near-normal filling pressures.  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   ? ?Had Cardiomems placed on 11/13/16. PA diastolic 9 mmHg at implant time.  ? ?Most recent echo in 7/22 showed EF 60-65%, normal RV.  ? ?She was admitted in 7/22 with acute metabolic encephalopathy, thought to be due to UTI.  ? ?Follow up 8/22, stable NYHA III symptoms and she was euvolemic. S/p revision arthroplasty of left total knee 1/23. Discharged back to SNF. ? ?Admitted to [facility] 4/23 with fall and a/c CHF. Diuresed 20 lbs with IV lasix. Discharged on torsemide 80 mg daily. ?Seen in ED a few days later with AMS. UTI work up negative but gently hydrated and given empiric abx. Remeron, baclofen and gabapentin were down titrated. Torsemide decreased to 40 mg every other day due to recent AKI. ? ?Today she returns for post hospital HF follow up. Overall feeling fine. She is at [facility] for rehab. No increased dyspnea with PT or transfers out of her WC. Legs have been swelling more. She has had several falls since last visit and has occasional dizziness. Denies palpitations, CP, abnormal bleeding or PND/Orthopnea. Appetite ok. No fever or  chills. Weighed daily at facility. Taking all medications provided at facility. Eating a lot of popcorn. Has not used Cardiomems in a few months, says she has to have a written Rx for facility. ? ?ReDs: 39% ? ?Cardiomems: no readings > 2 months. ? ?ECG (personally reviewed): NSR 61 bpm ? ?Labs (9/17): K 4.6, creatinine 1.55, BNP 63 ?Labs (10/17): K 4.7, creatinine 1.8 ?Labs ( 09/06/2016): K 3.7 Creatinine 2.05  ?Labs (1/18): K 5.1, creatinine 2.03 ?Labs (2/18): hgb 12, K 4.3, creatinine 1.87 ?Labs (11/13/2016) K 3.8 Creatinine 2.12  ?Labs (10/18): K 3.7, creatinine 1.6 ?Labs (1/19): K 4, creatinine 1.46 ?Labs (7/22): K 3.8, creatinine 1.04.  ?Labs (4/23): K 4.1, creatinine 1.30 ? ?Review of systems complete and found to be negative unless listed in HPI.   ? ?Past Medical History ?1. Chronic diastolic CHF ?- Echo 2/40/97 LVEF 65%, Mild LAE ?- RHC (10/17): mean RA 4, PA 25/11, mean PCWP 5, CI 2.4.  ?- Echo (2/18): EF 55-60%, mild LVH, normal RV size and systolic function. ?- Cardiomems implanted 3/18.  ?- Echo (7/22): EF 60-65%, normal RV, IVC normal ?2. CKD stage 3: Follows with nephology. Baseline appears to be creatinine 1.4-1.6.  ?3. Chronic lymphedema: Has had wraps.  ?4. HTN ?5. Dementia ?6. Fibromyalgia ?7. Microvascular angina:  ?- Normal coronaries on Cath 2009 ?- Stress Myoview 04/09/16 with no ischemia ?- LHC (10/17) with mild nonobstructive disease.  ?8. Restless leg syndrome ?9. H/o shoulder replacement.  ?10. OSA: CPAP.  ? ?  FH: Mother with CHF, brother with "heart disease."  Another brother with h/o MI.  ? ?Current Outpatient Medications  ?Medication Sig Dispense Refill  ? amLODipine (NORVASC) 2.5 MG tablet Take 2.5 mg by mouth daily.    ? aspirin EC 81 MG tablet Take 1 tablet (81 mg total) by mouth daily. 90 tablet 3  ? acetaminophen (TYLENOL) 325 MG tablet Take 650 mg by mouth every 4 (four) hours as needed (Pain).    ? albuterol (VENTOLIN HFA) 108 (90 Base) MCG/ACT inhaler Inhale 2 puffs into the lungs  every 4 (four) hours as needed for wheezing.    ? atorvastatin (LIPITOR) 40 MG tablet Take 40 mg by mouth every evening.    ? baclofen (LIORESAL) 10 MG tablet Take 10 mg by mouth 3 (three) times daily as needed for muscle spasms.    ? budesonide-formoterol (SYMBICORT) 160-4.5 MCG/ACT inhaler Inhale 2 puffs into the lungs in the morning and at bedtime.    ? carvedilol (COREG) 6.25 MG tablet Take 6.25 mg by mouth 2 (two) times daily with a meal.    ? Cholecalciferol (VITAMIN D3) 50 MCG (2000 UT) TABS Take 2,000 Units by mouth daily. (0900)    ? clopidogrel (PLAVIX) 75 MG tablet Take 75 mg by mouth daily.    ? Cyanocobalamin (VITAMIN B-12 IJ) Inject 1,000 mcg as directed every 30 (thirty) days. 24th of the month    ? desvenlafaxine (PRISTIQ) 50 MG 24 hr tablet Take 50 mg by mouth daily.    ? docusate sodium (COLACE) 100 MG capsule Take 100 mg by mouth 2 (two) times daily.    ? donepezil (ARICEPT) 10 MG tablet Take 20 mg by mouth at bedtime.    ? DULoxetine (CYMBALTA) 30 MG capsule Take 30 mg by mouth daily.    ? DULoxetine (CYMBALTA) 60 MG capsule Take 60 mg by mouth daily. Cross taper to Pristiq, only take dose for 1 week (stated 09/06/21)    ? gabapentin (NEURONTIN) 800 MG tablet Take 800 mg by mouth 3 (three) times daily.    ? KLOR-CON M20 20 MEQ tablet Take 20 mEq by mouth every other day.    ? lamoTRIgine (LAMICTAL) 100 MG tablet Take 100 mg by mouth 2 (two) times daily. (0900 & 2100)    ? levothyroxine (SYNTHROID, LEVOTHROID) 88 MCG tablet Take 88 mcg by mouth daily before breakfast. (0600)    ? lidocaine (LIDODERM) 5 % Place 1 patch onto the skin daily. Remove patch at 1900    ? mometasone (NASONEX) 50 MCG/ACT nasal spray Place 1 spray into the nose daily.    ? MYRBETRIQ 50 MG TB24 tablet Take 50 mg by mouth daily.    ? NON FORMULARY at bedtime. Cpap    ? omeprazole (PRILOSEC) 20 MG capsule Take 20 mg by mouth 2 (two) times daily before a meal. (0900 & 1700)    ? oxyCODONE (OXY IR/ROXICODONE) 5 MG immediate  release tablet Take 1-2 tablets (5-10 mg total) by mouth every 4 (four) hours as needed for moderate pain (pain score 4-6). 30 tablet 0  ? OXYGEN Inhale 2 L into the lungs See admin instructions. At bedtime and prn for shortness of breath    ? phenylephrine-shark liver oil-mineral oil-petrolatum (PREPARATION H) 0.25-14-74.9 % rectal ointment Place 1 application rectally 4 (four) times daily as needed for hemorrhoids.    ? polyethylene glycol (MIRALAX / GLYCOLAX) 17 g packet Take 17 g by mouth daily. 0600 Mix in 4-8 oz of Fluid  of choice    ? promethazine (PHENERGAN) 12.5 MG tablet Take 12.5 mg by mouth 3 (three) times daily as needed for nausea.    ? rOPINIRole (REQUIP) 3 MG tablet Take 3 mg by mouth 4 (four) times daily.    ? sennosides-docusate sodium (SENOKOT-S) 8.6-50 MG tablet Take 1 tablet by mouth daily.    ? torsemide (DEMADEX) 20 MG tablet Take 80 mg by mouth daily.    ? traZODone (DESYREL) 100 MG tablet Take 100 mg by mouth at bedtime.    ? traZODone (DESYREL) 50 MG tablet Take 25 mg by mouth at bedtime.    ? vitamin C (ASCORBIC ACID) 500 MG tablet Take 1,000 mg by mouth daily.    ? ?No current facility-administered medications for this encounter.  ? ?No Known Allergies ? ?Social History  ? ?Socioeconomic History  ? Marital status: Widowed  ?  Spouse name: Not on file  ? Number of children: 3  ? Years of education: Not on file  ? Highest education level: Not on file  ?Occupational History  ? Occupation: retired  ?Tobacco Use  ? Smoking status: Never  ? Smokeless tobacco: Never  ?Vaping Use  ? Vaping Use: Never used  ?Substance and Sexual Activity  ? Alcohol use: No  ?  Alcohol/week: 0.0 standard drinks  ? Drug use: No  ? Sexual activity: Not Currently  ?  Birth control/protection: Surgical, Post-menopausal  ?  Comment: Hysterectomy  ?Other Topics Concern  ? Not on file  ?Social History Narrative  ? Pt does use caffeine. Lives alone. 3 children. Retired.  ? ?Social Determinants of Health  ? ?Financial  Resource Strain: Not on file  ?Food Insecurity: Not on file  ?Transportation Needs: Not on file  ?Physical Activity: Not on file  ?Stress: Not on file  ?Social Connections: Not on file  ?Int[employer] Partner [employer]

## 2022-01-20 NOTE — Progress Notes (Addendum)
Advanced Heart Failure Clinic Note   Primary Care: Dr. Edrick Oh Renal: Dr Joelyn Oms HF Cardiology: Dr. Aundra Dubin  HPI: [name] is a 76 y.o. with history of chronic diastolic CHF (Echo 0/62/69 LVEF 65%, Mild LAE), chronic lymphedema, chronic back pain, HTN, CKD stage 3, and fibromyalgia.    Admitted several times in 02/2016 to [facility] with CHF and chest pain.  Has been tried on spironolactone + metolazone alone as diuretic regimen. Follows with renal as above.   She was admitted in 9/17 with chest pain and AKI.  She had seen nephrology and was given metolazone to take daily along with Lasix, creatinine rose considerably.  Lasix was decreased to 40 mg bid.  When creatinine improved, she had right and left heart cath showing nonobstructive CAD and near-normal filling pressures.  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    Had Cardiomems placed on 11/13/16. PA diastolic 9 mmHg at implant time.   Most recent echo in 7/22 showed EF 60-65%, normal RV.   She was admitted in 7/22 with acute metabolic encephalopathy, thought to be due to UTI.   Follow up 8/22, stable NYHA III symptoms and she was euvolemic. S/p revision arthroplasty of left total knee 1/23. Discharged back to SNF.  Admitted to [place] 4/23 with fall and a/c CHF. Diuresed 20 lbs with IV lasix. Discharged on torsemide 80 mg daily. Seen in ED a few days later with AMS. UTI work up negative but gently hydrated and given empiric abx. Remeron, baclofen and gabapentin were down titrated. Torsemide decreased to 40 mg every other day due to recent AKI.  Post hospital follow up 5/23 she was mildly volume overloaded, torsemide increased to 40 mg daily with 20 mg every other day.   Today she returns for HF follow up. Overall feeling fine. Walked 50 feet with PT this morning before having to sit down, this is an improvement for her. Has occasional atypical chest pain. Denies palpitations, abnormal  bleeding, dizziness, or PND/Orthopnea. Chronic lymphedema, not wearing leg pumps.. Appetite ok. No fever or chills. Taking all medications. Back using Cardiomems.  REDs: 36%  Cardiomems: PAd -2 on 01/11/22.  ECG (personally reviewed): none ordered today.  Labs (9/17): K 4.6, creatinine 1.55, BNP 63 Labs (10/17): K 4.7, creatinine 1.8 Labs ( 09/06/2016): K 3.7 Creatinine 2.05  Labs (1/18): K 5.1, creatinine 2.03 Labs (2/18): hgb 12, K 4.3, creatinine 1.87 Labs (11/13/2016) K 3.8 Creatinine 2.12  Labs (10/18): K 3.7, creatinine 1.6 Labs (1/19): K 4, creatinine 1.46 Labs (7/22): K 3.8, creatinine 1.04.  Labs (4/23): K 4.1, creatinine 1.30 Labs (5/23): K 4.4, creatinine 1.22  Review of systems complete and found to be negative unless listed in HPI.    Past Medical History 1. Chronic diastolic CHF - Echo 4/85/46 LVEF 65%, Mild LAE - RHC (10/17): mean RA 4, PA 25/11, mean PCWP 5, CI 2.4.  - Echo (2/18): EF 55-60%, mild LVH, normal RV size and systolic function. - Cardiomems implanted 3/18.  - Echo (7/22): EF 60-65%, normal RV, IVC normal 2. CKD stage 3: Follows with nephology. Baseline appears to be creatinine 1.4-1.6.  3. Chronic lymphedema: Has had wraps.  4. HTN 5. Dementia 6. Fibromyalgia 7. Microvascular angina:  - Normal coronaries on Cath 2009 - Stress Myoview 04/09/16 with no ischemia - LHC (10/17) with mild nonobstructive disease.  8. Restless leg syndrome 9. H/o shoulder replacement.  10. OSA: CPAP.   FH: Mother with CHF, brother with "heart  disease."  Another brother with h/o MI.   Current Outpatient Medications  Medication Sig Dispense Refill   acetaminophen (TYLENOL) 325 MG tablet Take 650 mg by mouth every 4 (four) hours as needed (Pain).     albuterol (VENTOLIN HFA) 108 (90 Base) MCG/ACT inhaler Inhale 2 puffs into the lungs every 4 (four) hours as needed for wheezing.     amLODipine (NORVASC) 2.5 MG tablet Take 2.5 mg by mouth daily.     aspirin EC 81 MG tablet  Take 1 tablet (81 mg total) by mouth daily. 90 tablet 3   BACLOFEN PO Patient takes 5 mg by mouth every 8 hrs as needed.     carvedilol (COREG) 3.125 MG tablet Take 3.125 mg by mouth 2 (two) times daily with a meal.     donepezil (ARICEPT) 10 MG tablet Take 20 mg by mouth at bedtime.     fluticasone (FLONASE) 50 MCG/ACT nasal spray 2 sprays daily.     atorvastatin (LIPITOR) 40 MG tablet Take 40 mg by mouth every evening.     budesonide-formoterol (SYMBICORT) 160-4.5 MCG/ACT inhaler Inhale 2 puffs into the lungs in the morning and at bedtime.     busPIRone (BUSPAR) 10 MG tablet Take 10 mg by mouth 2 (two) times daily.     Cholecalciferol (VITAMIN D3) 50 MCG (2000 UT) TABS Take 2,000 Units by mouth daily. (0900)     clopidogrel (PLAVIX) 75 MG tablet Take 75 mg by mouth daily.     docusate sodium (COLACE) 100 MG capsule Take 100 mg by mouth 2 (two) times daily.     gabapentin (NEURONTIN) 800 MG tablet Take 800 mg by mouth 3 (three) times daily.     KLOR-CON M20 20 MEQ tablet Take 20 mEq by mouth every other day.     lamoTRIgine (LAMICTAL) 100 MG tablet Take 100 mg by mouth 2 (two) times daily. (0900 & 2100)     levothyroxine (SYNTHROID, LEVOTHROID) 88 MCG tablet Take 88 mcg by mouth daily before breakfast. (0600)     lidocaine (LIDODERM) 5 % Place 1 patch onto the skin daily. Remove patch at 1900     mirtazapine (REMERON) 7.5 MG tablet Take by mouth.     MYRBETRIQ 50 MG TB24 tablet Take 50 mg by mouth daily.     NON FORMULARY at bedtime. Cpap     oxyCODONE (OXY IR/ROXICODONE) 5 MG immediate release tablet Take 1-2 tablets (5-10 mg total) by mouth every 4 (four) hours as needed for moderate pain (pain score 4-6). 30 tablet 0   OXYGEN Inhale 2 L into the lungs See admin instructions. At bedtime and prn for shortness of breath     pantoprazole (PROTONIX) 40 MG tablet Take 40 mg by mouth daily.     phenylephrine-shark liver oil-mineral oil-petrolatum (PREPARATION H) 0.25-14-74.9 % rectal ointment  Place 1 application rectally 4 (four) times daily as needed for hemorrhoids.     polyethylene glycol (MIRALAX / GLYCOLAX) 17 g packet Take 17 g by mouth daily. 0600 Mix in 4-8 oz of Fluid of choice     promethazine (PHENERGAN) 12.5 MG tablet Take 12.5 mg by mouth 3 (three) times daily as needed for nausea.     rOPINIRole (REQUIP) 2 MG tablet Take 2 mg by mouth 3 (three) times daily.     sennosides-docusate sodium (SENOKOT-S) 8.6-50 MG tablet Take 1 tablet by mouth daily.     torsemide (DEMADEX) 20 MG tablet Take 2 tablets by mouth daily alternating with 1  tablet daily. 45 tablet 2   traZODone (DESYREL) 100 MG tablet Take 100 mg by mouth at bedtime.     vitamin C (ASCORBIC ACID) 500 MG tablet Take 1,000 mg by mouth daily.     No current facility-administered medications for this encounter.   No Known Allergies  Social History   Socioeconomic History   Marital status: Widowed    Spouse name: Not on file   Number of children: 3   Years of education: Not on file   Highest education level: Not on file  Occupational History   Occupation: retired  Tobacco Use   Smoking status: Never   Smokeless tobacco: Never  Vaping Use   Vaping Use: Never used  Substance and Sexual Activity   Alcohol use: No    Alcohol/week: 0.0 standard drinks   Drug use: No   Sexual activity: Not Currently    Birth control/protection: Surgical, Post-menopausal    Comment: Hysterectomy  Other Topics Concern   Not on file  Social History Narrative   Pt does use caffeine. Lives alone. 3 children. Retired.   Social Determinants of Health   Financial Resource Strain: Not on file  Food Insecurity: Not on file  Transportation Needs: Not on file  Physical Activity: Not on file  Stress: Not on file  Social Connections: Not on file  Intimate Partner Violence: Not on file   BP 124/66   Pulse 67   Wt 99.9 kg (220 lb 3.2 oz) Comment: Patient stated she was weighed at the facility this morning.  SpO2 93%   BMI  36.64 kg/m   Wt Readings from Last 3 Encounters:  01/22/22 99.9 kg (220 lb 3.2 oz)  12/24/21 97.1 kg (214 lb)  09/18/21 99.1 kg (218 lb 7.6 oz)   PHYSICAL EXAM: General:  NAD. No resp difficulty, arrived in [facility] HEENT: Normal Neck: Supple. No JVD. Carotids 2+ bilat; no bruits. No lymphadenopathy or thryomegaly appreciated. Cor: PMI nondisplaced. Regular rate & rhythm. No rubs, gallops or murmurs. Lungs: Clear Abdomen: Obese, nontender, nondistended. No hepatosplenomegaly. No bruits or masses. Good bowel sounds. Extremities: No cyanosis, clubbing, rash, chronic BLE lymphedema Neuro: Alert & oriented x 3, cranial nerves grossly intact. Moves all 4 extremities w/o difficulty. Affect pleasant.  ASSESSMENT & PLAN: 1. Chronic diastolic CHF:  Echo (5/27) with EF 55-60%, normal RV, echo (7/22) with EF 60-65%, normal RV.  Has Cardiomems. She is not markedly volume overloaded today, ReDs 36%, but weight up 6 lbs. She has chronic lymphedema and goes to the lymphedema clinic. Stable NYHA class III symptoms.  - Increase torsemide to 40 mg daily x 3 days, then back to 40 mg daily alternating with 20  mg every other day. BMET today. - Continue carvedilol 3.125 mg bid. - Avoid SGLT2i with recent UTI. 2. Lymphedema: She needs custom compression wraps to control her swelling. 3. CKD: Stage 3. BMET today.  - She is followed by Nephrology. 4. Chest pain: LHC 2017 Nonobstructive.  Atypical recently, likely related to fluid.   5. HTN: stable today. 6. Physical deconditioning: - Continue PT at SNF.  Follow up in 3 months with Dr. Aundra Dubin, as scheduled.  [place], [place]  01/22/2022

## 2022-01-22 ENCOUNTER — Ambulatory Visit (HOSPITAL_COMMUNITY)
Admission: RE | Admit: 2022-01-22 | Discharge: 2022-01-22 | Disposition: A | Discharge status: Home / Self Care | Payer: Medicare Other | Source: Ambulatory Visit | Attending: Family Medicine | Admitting: Family Medicine

## 2022-01-22 ENCOUNTER — Encounter (HOSPITAL_COMMUNITY): Payer: Self-pay

## 2022-01-22 VITALS — BP 124/66 | HR 67 | Wt 220.2 lb

## 2022-01-22 DIAGNOSIS — M797 Fibromyalgia: Secondary | ICD-10-CM | POA: Insufficient documentation

## 2022-01-22 DIAGNOSIS — N183 Chronic kidney disease, stage 3 unspecified: Secondary | ICD-10-CM | POA: Diagnosis not present

## 2022-01-22 DIAGNOSIS — Z8639 Personal history of other endocrine, nutritional and metabolic disease: Secondary | ICD-10-CM | POA: Diagnosis not present

## 2022-01-22 DIAGNOSIS — Z87440 Personal history of urinary (tract) infections: Secondary | ICD-10-CM | POA: Insufficient documentation

## 2022-01-22 DIAGNOSIS — I130 Hypertensive heart and chronic kidney disease with heart failure and stage 1 through stage 4 chronic kidney disease, or unspecified chronic kidney disease: Secondary | ICD-10-CM | POA: Diagnosis present

## 2022-01-22 DIAGNOSIS — Z79899 Other long term (current) drug therapy: Secondary | ICD-10-CM | POA: Diagnosis not present

## 2022-01-22 DIAGNOSIS — Z8669 Personal history of other diseases of the nervous system and sense organs: Secondary | ICD-10-CM | POA: Insufficient documentation

## 2022-01-22 DIAGNOSIS — R079 Chest pain, unspecified: Secondary | ICD-10-CM | POA: Diagnosis not present

## 2022-01-22 DIAGNOSIS — M549 Dorsalgia, unspecified: Secondary | ICD-10-CM | POA: Diagnosis not present

## 2022-01-22 DIAGNOSIS — I5032 Chronic diastolic (congestive) heart failure: Secondary | ICD-10-CM | POA: Insufficient documentation

## 2022-01-22 DIAGNOSIS — R5381 Other malaise: Secondary | ICD-10-CM

## 2022-01-22 DIAGNOSIS — G8929 Other chronic pain: Secondary | ICD-10-CM | POA: Diagnosis not present

## 2022-01-22 DIAGNOSIS — Z8249 Family history of ischemic heart disease and other diseases of the circulatory system: Secondary | ICD-10-CM | POA: Insufficient documentation

## 2022-01-22 DIAGNOSIS — R0789 Other chest pain: Secondary | ICD-10-CM | POA: Diagnosis not present

## 2022-01-22 DIAGNOSIS — I890 Lymphedema, not elsewhere classified: Secondary | ICD-10-CM | POA: Insufficient documentation

## 2022-01-22 DIAGNOSIS — Z96652 Presence of left artificial knee joint: Secondary | ICD-10-CM | POA: Insufficient documentation

## 2022-01-22 DIAGNOSIS — I2510 Atherosclerotic heart disease of native coronary artery without angina pectoris: Secondary | ICD-10-CM | POA: Insufficient documentation

## 2022-01-22 DIAGNOSIS — I10 Essential (primary) hypertension: Secondary | ICD-10-CM

## 2022-01-22 LAB — BASIC METABOLIC PANEL
Anion gap: 5 (ref 5–15)
BUN: 27 mg/dL — ABNORMAL HIGH (ref 8–23)
CO2: 33 mmol/L — ABNORMAL HIGH (ref 22–32)
Calcium: 10.6 mg/dL — ABNORMAL HIGH (ref 8.9–10.3)
Chloride: 103 mmol/L (ref 98–111)
Creatinine, Ser: 1.32 mg/dL — ABNORMAL HIGH (ref 0.44–1.00)
GFR, Estimated: 42 mL/min — ABNORMAL LOW (ref >60)
Glucose, Bld: 102 mg/dL — ABNORMAL HIGH (ref 70–99)
Potassium: 4.8 mmol/L (ref 3.5–5.1)
Sodium: 141 mmol/L (ref 135–145)

## 2022-01-22 NOTE — Progress Notes (Signed)
ReDS Vest / Clip - 01/22/22 0900       ReDS Vest / Clip   Station Marker A    Ruler Value 26.5    ReDS Value Range Moderate volume overload    ReDS Actual Value 36

## 2022-01-22 NOTE — Patient Instructions (Addendum)
The instructions below were given to patients SNF order form [facility])  INCREASE Torsemide to 40 mg daily for 3 days, then resume normal dose of alternatinf 40 mg daily with 20 mg daily  Labs today We will only contact you if something comes back abnormal or we need to make some changes. Otherwise no news is good news!  Keep cardiology follow up as scheduled  Do the following things EVERYDAY: Weigh yourself in the morning before breakfast. Write it down and keep it in a log. Take your medicines as prescribed Eat low salt foods--Limit salt (sodium) to 2000 mg per day.  Stay as active as you can everyday Limit all fluids for the day to less than 2 liters  At the [facility], you and your health needs are our priority. As part of our continuing mission to provide you with exceptional heart care, we have created designated Provider Care Teams. These Care Teams include your primary Cardiologist (physician) and Advanced Practice Providers (APPs- Physician Assistants and Nurse Practitioners) who all work together to provide you with the care you need, when you need it.   You may see any of the following providers on your designated Care Team at your next follow up: Dr Glori Bickers Dr Haynes Kerns, NP Lyda Jester, [place] [facility] [place], [place] Audry Riles, PharmD   Please be sure to bring in all your medications bottles to every appointment.

## 2022-01-28 ENCOUNTER — Telehealth (HOSPITAL_COMMUNITY): Payer: Self-pay

## 2022-01-28 ENCOUNTER — Encounter (HOSPITAL_COMMUNITY): Payer: Self-pay

## 2022-01-28 NOTE — Telephone Encounter (Signed)
Patient letter has been mailed in regards to her results.

## 2022-02-17 ENCOUNTER — Telehealth (HOSPITAL_COMMUNITY): Payer: Self-pay | Admitting: *Deleted

## 2022-03-02 NOTE — Addendum Note (Signed)
Encounter addended by: Rafael Bihari, FNP on: 03/02/2022 10:53 AM  Actions taken: Clinical Note Signed

## 2022-03-17 ENCOUNTER — Ambulatory Visit: Payer: Medicaid Other | Admitting: Orthopaedic Surgery

## 2022-04-14 ENCOUNTER — Ambulatory Visit (INDEPENDENT_AMBULATORY_CARE_PROVIDER_SITE_OTHER): Payer: Medicare Other

## 2022-04-14 ENCOUNTER — Encounter: Payer: Self-pay | Admitting: Orthopaedic Surgery

## 2022-04-14 ENCOUNTER — Ambulatory Visit (INDEPENDENT_AMBULATORY_CARE_PROVIDER_SITE_OTHER): Payer: Medicare Other | Admitting: Orthopaedic Surgery

## 2022-04-14 DIAGNOSIS — Z96652 Presence of left artificial knee joint: Secondary | ICD-10-CM | POA: Diagnosis not present

## 2022-04-14 NOTE — Progress Notes (Signed)
The patient is someone who had revision arthroplasty of her left knee in January of this year.  She is someone who used to be morbidly obese but with significant health issues has lost weight over the last year.  We worked on revising the femoral and tibial components with more rotation externally because of chronic subluxation of the patella.  I did not perform her original surgeries.  Her x-rays of that knee in March of this year showed the patella was in good position.  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  I did talk to her daughter on the phone and the patient is just been in the hospital again recently with shingles.  Before that its been dealing with pneumonia.  She mobilizes minimally at this standpoint.  Both me and her daughter feel that surgery is not a good option right now given her significant comorbidities.  We can certainly have therapy work on her mobility and strengthening but she only needs to be up with assistance.  Does not really any type of brace that would help this and my next option for her surgical would be potentially even a patellectomy me but her mobility is still limited that surgery is really not warranted unless she has significant issues but with her declining medical status, I be hesitant to perform any surgery on her and her daughter agrees with this as well.  All question concerns were answered and addressed.  Follow-up can be as needed.

## 2022-04-28 ENCOUNTER — Encounter (HOSPITAL_COMMUNITY): Payer: Medicaid Other | Admitting: Cardiology

## 2022-05-06 ENCOUNTER — Encounter (HOSPITAL_COMMUNITY): Payer: Self-pay | Admitting: Cardiology

## 2022-05-06 ENCOUNTER — Ambulatory Visit (HOSPITAL_COMMUNITY)
Admission: RE | Admit: 2022-05-06 | Discharge: 2022-05-06 | Disposition: A | Discharge status: Home / Self Care | Payer: Medicare Other | Source: Ambulatory Visit | Attending: Cardiology | Admitting: Cardiology

## 2022-05-06 VITALS — BP 100/60 | HR 67

## 2022-05-06 DIAGNOSIS — R079 Chest pain, unspecified: Secondary | ICD-10-CM | POA: Diagnosis not present

## 2022-05-06 DIAGNOSIS — I890 Lymphedema, not elsewhere classified: Secondary | ICD-10-CM | POA: Insufficient documentation

## 2022-05-06 DIAGNOSIS — G8929 Other chronic pain: Secondary | ICD-10-CM | POA: Insufficient documentation

## 2022-05-06 DIAGNOSIS — Z79899 Other long term (current) drug therapy: Secondary | ICD-10-CM | POA: Diagnosis not present

## 2022-05-06 DIAGNOSIS — M549 Dorsalgia, unspecified: Secondary | ICD-10-CM | POA: Diagnosis not present

## 2022-05-06 DIAGNOSIS — I5032 Chronic diastolic (congestive) heart failure: Secondary | ICD-10-CM | POA: Insufficient documentation

## 2022-05-06 DIAGNOSIS — M797 Fibromyalgia: Secondary | ICD-10-CM | POA: Diagnosis not present

## 2022-05-06 DIAGNOSIS — I2510 Atherosclerotic heart disease of native coronary artery without angina pectoris: Secondary | ICD-10-CM | POA: Insufficient documentation

## 2022-05-06 DIAGNOSIS — I130 Hypertensive heart and chronic kidney disease with heart failure and stage 1 through stage 4 chronic kidney disease, or unspecified chronic kidney disease: Secondary | ICD-10-CM | POA: Insufficient documentation

## 2022-05-06 DIAGNOSIS — N183 Chronic kidney disease, stage 3 unspecified: Secondary | ICD-10-CM | POA: Insufficient documentation

## 2022-05-06 DIAGNOSIS — Z7901 Long term (current) use of anticoagulants: Secondary | ICD-10-CM | POA: Diagnosis not present

## 2022-05-06 DIAGNOSIS — Z993 Dependence on wheelchair: Secondary | ICD-10-CM | POA: Diagnosis not present

## 2022-05-06 DIAGNOSIS — M25569 Pain in unspecified knee: Secondary | ICD-10-CM | POA: Insufficient documentation

## 2022-05-06 LAB — BASIC METABOLIC PANEL
Anion gap: 6 (ref 5–15)
BUN: 15 mg/dL (ref 8–23)
CO2: 34 mmol/L — ABNORMAL HIGH (ref 22–32)
Calcium: 11.9 mg/dL — ABNORMAL HIGH (ref 8.9–10.3)
Chloride: 103 mmol/L (ref 98–111)
Creatinine, Ser: 1.1 mg/dL — ABNORMAL HIGH (ref 0.44–1.00)
GFR, Estimated: 52 mL/min — ABNORMAL LOW (ref >60)
Glucose, Bld: 97 mg/dL (ref 70–99)
Potassium: 4.7 mmol/L (ref 3.5–5.1)
Sodium: 143 mmol/L (ref 135–145)

## 2022-05-06 LAB — BRAIN NATRIURETIC PEPTIDE: B Natriuretic Peptide: 38 pg/mL (ref 0.0–100.0)

## 2022-05-06 NOTE — Progress Notes (Signed)
Advanced Heart Failure Clinic Note   Primary Care: Dr. Edrick Oh Renal: Dr Joelyn Oms HF Cardiology: Dr. Aundra Dubin  HPI: [name] is a 76 y.o. with history of chronic diastolic CHF (Echo 6/54/65 LVEF 65%, Mild LAE), chronic lymphedema, chronic back pain, HTN, CKD stage 3, and fibromyalgia.    Admitted several times in 02/2016 to [facility] with CHF and chest pain.  Has been tried on spironolactone + metolazone alone as diuretic regimen. Follows with renal as above.   She was admitted in 9/17 with chest pain and AKI.  She had seen nephrology and was given metolazone to take daily along with Lasix, creatinine rose considerably.  Lasix was decreased to 40 mg bid.  When creatinine improved, she had right and left heart cath showing nonobstructive CAD and near-normal filling pressures.  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    Had Cardiomems placed on 11/13/16. PA diastolic 9 mmHg at implant time.   Most recent echo in 7/22 showed EF 60-65%, normal RV.   She was admitted in 7/22 with acute metabolic encephalopathy, thought to be due to UTI.   Follow up 8/22, stable NYHA III symptoms and she was euvolemic. S/p revision arthroplasty of left total knee 1/23. Discharged back to SNF.  Admitted to [place] 4/23 with fall and a/c CHF. Diuresed 20 lbs with IV lasix. Discharged on torsemide 80 mg daily. Seen in ED a few days later with AMS. UTI work up negative but gently hydrated and given empiric abx. Remeron, baclofen and gabapentin were down titrated. Torsemide decreased to 40 mg every other day due to recent AKI.  Post hospital follow up 5/23 she was mildly volume overloaded, torsemide increased to 40 mg daily with 20 mg every other day.   Admitted in 6/23 with PE and PNA.  Now on apixaban.   Admitted in 8/23 with viral URI.   Today she returns for HF follow up. Living at SNF.  Has Cardiomems pillow in her closet, but says staff has told her they are "too  busy" to get it out for her.  She is wheelchair-bound, does not walk due to knee pain.   She is doing PT.   She wears oxygen at night.  No chest pain.  No dyspnea at rest.   No recent Cardiomems use.   ECG (personally reviewed): NSR, LVH  Labs (9/17): K 4.6, creatinine 1.55, BNP 63 Labs (10/17): K 4.7, creatinine 1.8 Labs ( 09/06/2016): K 3.7 Creatinine 2.05  Labs (1/18): K 5.1, creatinine 2.03 Labs (2/18): hgb 12, K 4.3, creatinine 1.87 Labs (11/13/2016) K 3.8 Creatinine 2.12  Labs (10/18): K 3.7, creatinine 1.6 Labs (1/19): K 4, creatinine 1.46 Labs (7/22): K 3.8, creatinine 1.04.  Labs (4/23): K 4.1, creatinine 1.30 Labs (5/23): K 4.4, creatinine 1.22 Labs (8/23): K 3.8, creatinine 1.0  Review of systems complete and found to be negative unless listed in HPI.    Past Medical History 1. Chronic diastolic CHF - Echo 0/35/46 LVEF 65%, Mild LAE - RHC (10/17): mean RA 4, PA 25/11, mean PCWP 5, CI 2.4.  - Echo (2/18): EF 55-60%, mild LVH, normal RV size and systolic function. - Cardiomems implanted 3/18.  - Echo (7/22): EF 60-65%, normal RV, IVC normal 2. CKD stage 3: Follows with nephology. Baseline appears to be creatinine 1.4-1.6.  3. Chronic lymphedema: Has had wraps.  4. HTN 5. Dementia 6. Fibromyalgia 7. Microvascular angina:  - Normal coronaries on Cath 2009 - Stress Myoview 04/09/16  with no ischemia - LHC (10/17) with mild nonobstructive disease.  8. Restless leg syndrome 9. H/o shoulder replacement.  10. OSA: CPAP.  11. PE 6/23  FH: Mother with CHF, brother with "heart disease."  Another brother with h/o MI.   Current Outpatient Medications  Medication Sig Dispense Refill   acetaminophen (TYLENOL) 325 MG tablet Take 650 mg by mouth every 4 (four) hours as needed (Pain).     albuterol (VENTOLIN HFA) 108 (90 Base) MCG/ACT inhaler Inhale 2 puffs into the lungs every 4 (four) hours as needed for wheezing.     amLODipine (NORVASC) 2.5 MG tablet Take 2.5 mg by mouth  daily.     apixaban (ELIQUIS) 5 MG TABS tablet Take 5 mg by mouth 2 (two) times daily.     atorvastatin (LIPITOR) 40 MG tablet Take 40 mg by mouth every evening.     budesonide-formoterol (SYMBICORT) 160-4.5 MCG/ACT inhaler Inhale 2 puffs into the lungs in the morning and at bedtime.     carvedilol (COREG) 3.125 MG tablet Take 3.125 mg by mouth 2 (two) times daily with a meal.     Cholecalciferol (VITAMIN D3) 50 MCG (2000 UT) TABS Take 2,000 Units by mouth daily. (0900)     clopidogrel (PLAVIX) 75 MG tablet Take 75 mg by mouth daily.     donepezil (ARICEPT) 10 MG tablet Take 20 mg by mouth at bedtime.     ferrous sulfate 325 (65 FE) MG EC tablet Take 325 mg by mouth daily with breakfast.     fluticasone (FLONASE) 50 MCG/ACT nasal spray 2 sprays daily.     lamoTRIgine (LAMICTAL) 100 MG tablet Take 100 mg by mouth 2 (two) times daily. (0900 & 2100)     levothyroxine (SYNTHROID, LEVOTHROID) 88 MCG tablet Take 88 mcg by mouth daily before breakfast. (0600)     lidocaine (LIDODERM) 5 % Place 1 patch onto the skin daily. Remove patch at 1900     MYRBETRIQ 50 MG TB24 tablet Take 50 mg by mouth daily.     OXYGEN Inhale 2 L into the lungs See admin instructions. At bedtime and prn for shortness of breath     pantoprazole (PROTONIX) 40 MG tablet Take 40 mg by mouth daily.     polyethylene glycol (MIRALAX / GLYCOLAX) 17 g packet Take 17 g by mouth daily. 0600 Mix in 4-8 oz of Fluid of choice     potassium chloride SA (KLOR-CON M) 20 MEQ tablet Take 20 mEq by mouth daily.     pregabalin (LYRICA) 75 MG capsule Take 75 mg by mouth 3 (three) times daily.     rOPINIRole (REQUIP) 4 MG tablet Take 4 mg by mouth in the morning, at noon, and at bedtime.     sennosides-docusate sodium (SENOKOT-S) 8.6-50 MG tablet Take 1 tablet by mouth daily.     torsemide (DEMADEX) 20 MG tablet Patient takes 1 tablet by mouth every Tuesday Thursday and and Saturday for edema.     venlafaxine (EFFEXOR) 37.5 MG tablet Take 37.5 mg  by mouth daily.     vitamin C (ASCORBIC ACID) 500 MG tablet Take 1,000 mg by mouth daily.     No current facility-administered medications for this encounter.   No Known Allergies  Social History   Socioeconomic History   Marital status: Widowed    Spouse name: Not on file   Number of children: 3   Years of education: Not on file   Highest education level: Not on file  Occupational History   Occupation: retired  Tobacco Use   Smoking status: Never   Smokeless tobacco: Never  Vaping Use   Vaping Use: Never used  Substance and Sexual Activity   Alcohol use: No    Alcohol/week: 0.0 standard drinks of alcohol   Drug use: No   Sexual activity: Not Currently    Birth control/protection: Surgical, Post-menopausal    Comment: Hysterectomy  Other Topics Concern   Not on file  Social History Narrative   Pt does use caffeine. Lives alone. 3 children. Retired.   Social Determinants of Health   Financial Resource Strain: Not on file  Food Insecurity: Not on file  Transportation Needs: Not on file  Physical Activity: Not on file  Stress: Not on file  Social Connections: Not on file  Intimate Partner Violence: Not on file   BP 100/60   Pulse 67   SpO2 90%   Wt Readings from Last 3 Encounters:  01/22/22 99.9 kg (220 lb 3.2 oz)  12/24/21 97.1 kg (214 lb)  09/18/21 99.1 kg (218 lb 7.6 oz)   PHYSICAL EXAM: General: NAD Neck: No JVD, no thyromegaly or thyroid nodule.  Lungs: Clear to auscultation bilaterally with normal respiratory effort. CV: Nondisplaced PMI.  Heart regular S1/S2, no S3/S4, no murmur.  Bilateral lower leg lymphedema.  No carotid bruit.  Normal pedal pulses.  Abdomen: Soft, nontender, no hepatosplenomegaly, no distention.  Skin: Intact without lesions or rashes.  Neurologic: Alert and oriented x 3.  Psych: Normal affect. Extremities: No clubbing or cyanosis.  HEENT: Normal.   ASSESSMENT & PLAN: 1. Chronic diastolic CHF:  Echo (9/60) with EF 55-60%,  normal RV, echo (7/22) with EF 60-65%, normal RV.  Has Cardiomems but has not been using.  Wheelchair-bound, not dyspneic at rest.  She has lymphedema but does not appear volume overloaded.  - I wrote an order for her SNF to help her set up her Cardiomems pillow so she can use the Cardiomems.   - Continue torsemide 20 mg three times/week. BMET/BNP today.  - Continue carvedilol 3.125 mg bid. - Avoid SGLT2i with UTIs and poor mobility. 2. Lymphedema: Chronic.  3. CKD: Stage 3. BMET today.  - She is followed by Nephrology. 4. Chest pain: LHC 2017 with nonobstructive CAD.    5. HTN: BP stable today. 6. Physical deconditioning: - Continue PT at SNF.  Follow up in 3 months with APP.   Loralie Champagne, MD  05/06/2022

## 2022-05-06 NOTE — Patient Instructions (Signed)
There has been no changes to your medications..  Labs done today, your results will be available in MyChart, we will contact you for abnormal readings.  Your physician recommends that you schedule a follow-up appointment in: 3 months   If you have any questions or concerns before your next appointment please send us a message through mychart or call our office at [phone].    TO LEAVE A MESSAGE FOR THE NURSE SELECT OPTION 2, PLEASE LEAVE A MESSAGE INCLUDING: YOUR NAME DATE OF BIRTH CALL BACK NUMBER REASON FOR CALL**this is important as we prioritize the call backs  YOU WILL RECEIVE A CALL BACK THE SAME DAY AS LONG AS YOU CALL BEFORE 4:00 PM  At the [facility], you and your health needs are our priority. As part of our continuing mission to provide you with exceptional heart care, we have created designated Provider Care Teams. These Care Teams include your primary Cardiologist (physician) and Advanced Practice Providers (APPs- Physician Assistants and Nurse Practitioners) who all work together to provide you with the care you need, when you need it.   You may see any of the following providers on your designated Care Team at your next follow up: Dr Daniel Bensimhon Dr Dalton McLean Dr. Aditya Sabharwal Amy Clegg, NP Brittainy Simmons, PA Jessica Milford,NP Lindsay Finch, PA Alma Diaz, NP Lauren Kemp, PharmD   Please be sure to bring in all your medications bottles to every appointment.    

## 2022-08-05 NOTE — Progress Notes (Incomplete)
Advanced Heart Failure Clinic Note   Primary Care: Dr. Edrick Oh Renal: Dr Joelyn Oms HF Cardiology: Dr. Aundra Dubin  HPI: [name] is a 76 y.o. with history of chronic diastolic CHF (Echo 5/00/93 LVEF 65%, Mild LAE), chronic lymphedema, chronic back pain, HTN, CKD stage 3, and fibromyalgia.    Admitted several times in 02/2016 to [facility] with CHF and chest pain.  Has been tried on spironolactone + metolazone alone as diuretic regimen.   She was admitted in 9/17 with chest pain and AKI.  She had seen nephrology and was given metolazone to take daily along with Lasix, creatinine rose considerably.  Lasix was decreased to 40 mg bid.  When creatinine improved, she had right and left heart cath showing nonobstructive CAD and near-normal filling pressures.  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    Had Cardiomems placed on 11/13/16. PA diastolic 9 mmHg at implant time.   Echo 7/22 showed EF 60-65%, normal RV.   She was admitted in 7/22 with acute metabolic encephalopathy, thought to be due to UTI.   S/p revision arthroplasty of left total knee 1/23. Discharged back to SNF.  Admitted to [facility] 4/23 with fall and a/c CHF. Diuresed 20 lbs with IV lasix. Discharged on torsemide 80 mg daily. Seen in ED a few days later with AMS. UTI work up negative but gently hydrated and given empiric abx. Remeron, baclofen and gabapentin were down titrated.   Admitted in 6/23 with PE and PNA.  Now on apixaban.   Admitted in 8/23 with viral URI.   Today she returns for HF follow up. Living at SNF.  Has Cardiomems pillow in her closet, but says staff has told her they are "too busy" to get it out for her.  She is wheelchair-bound, does not walk due to knee pain.   She is doing PT.   She wears oxygen at night.  No chest pain.  No dyspnea at rest.   No recent Cardiomems use.   ECG (personally reviewed): NSR, LVH  Labs (9/17): K 4.6, creatinine 1.55, BNP 63 Labs (10/17):  K 4.7, creatinine 1.8 Labs ( 09/06/2016): K 3.7 Creatinine 2.05  Labs (1/18): K 5.1, creatinine 2.03 Labs (2/18): hgb 12, K 4.3, creatinine 1.87 Labs (11/13/2016) K 3.8 Creatinine 2.12  Labs (10/18): K 3.7, creatinine 1.6 Labs (1/19): K 4, creatinine 1.46 Labs (7/22): K 3.8, creatinine 1.04.  Labs (4/23): K 4.1, creatinine 1.30 Labs (5/23): K 4.4, creatinine 1.22 Labs (8/23): K 3.8, creatinine 1.0  Review of systems complete and found to be negative unless listed in HPI.    Past Medical History 1. Chronic diastolic CHF - Echo 04/10/28 LVEF 65%, Mild LAE - RHC (10/17): mean RA 4, PA 25/11, mean PCWP 5, CI 2.4.  - Echo (2/18): EF 55-60%, mild LVH, normal RV size and systolic function. - Cardiomems implanted 3/18.  - Echo (7/22): EF 60-65%, normal RV, IVC normal 2. CKD stage 3: Follows with nephology. Baseline appears to be creatinine 1.4-1.6.  3. Chronic lymphedema: Has had wraps.  4. HTN 5. Dementia 6. Fibromyalgia 7. Microvascular angina:  - Normal coronaries on Cath 2009 - Stress Myoview 04/09/16 with no ischemia - LHC (10/17) with mild nonobstructive disease.  8. Restless leg syndrome 9. H/o shoulder replacement.  10. OSA: CPAP.  11. PE 6/23  FH: Mother with CHF, brother with "heart disease."  Another brother with h/o MI.   Current Outpatient Medications  Medication Sig Dispense Refill  acetaminophen (TYLENOL) 325 MG tablet Take 650 mg by mouth every 4 (four) hours as needed (Pain).     albuterol (VENTOLIN HFA) 108 (90 Base) MCG/ACT inhaler Inhale 2 puffs into the lungs every 4 (four) hours as needed for wheezing.     amLODipine (NORVASC) 2.5 MG tablet Take 2.5 mg by mouth daily.     apixaban (ELIQUIS) 5 MG TABS tablet Take 5 mg by mouth 2 (two) times daily.     atorvastatin (LIPITOR) 40 MG tablet Take 40 mg by mouth every evening.     budesonide-formoterol (SYMBICORT) 160-4.5 MCG/ACT inhaler Inhale 2 puffs into the lungs in the morning and at bedtime.     carvedilol  (COREG) 3.125 MG tablet Take 3.125 mg by mouth 2 (two) times daily with a meal.     Cholecalciferol (VITAMIN D3) 50 MCG (2000 UT) TABS Take 2,000 Units by mouth daily. (0900)     clopidogrel (PLAVIX) 75 MG tablet Take 75 mg by mouth daily.     donepezil (ARICEPT) 10 MG tablet Take 20 mg by mouth at bedtime.     ferrous sulfate 325 (65 FE) MG EC tablet Take 325 mg by mouth daily with breakfast.     fluticasone (FLONASE) 50 MCG/ACT nasal spray 2 sprays daily.     lamoTRIgine (LAMICTAL) 100 MG tablet Take 100 mg by mouth 2 (two) times daily. (0900 & 2100)     levothyroxine (SYNTHROID, LEVOTHROID) 88 MCG tablet Take 88 mcg by mouth daily before breakfast. (0600)     lidocaine (LIDODERM) 5 % Place 1 patch onto the skin daily. Remove patch at 1900     MYRBETRIQ 50 MG TB24 tablet Take 50 mg by mouth daily.     OXYGEN Inhale 2 L into the lungs See admin instructions. At bedtime and prn for shortness of breath     pantoprazole (PROTONIX) 40 MG tablet Take 40 mg by mouth daily.     polyethylene glycol (MIRALAX / GLYCOLAX) 17 g packet Take 17 g by mouth daily. 0600 Mix in 4-8 oz of Fluid of choice     potassium chloride SA (KLOR-CON M) 20 MEQ tablet Take 20 mEq by mouth daily.     pregabalin (LYRICA) 75 MG capsule Take 75 mg by mouth 3 (three) times daily.     rOPINIRole (REQUIP) 4 MG tablet Take 4 mg by mouth in the morning, at noon, and at bedtime.     sennosides-docusate sodium (SENOKOT-S) 8.6-50 MG tablet Take 1 tablet by mouth daily.     torsemide (DEMADEX) 20 MG tablet Patient takes 1 tablet by mouth every Tuesday Thursday and and Saturday for edema.     venlafaxine (EFFEXOR) 37.5 MG tablet Take 37.5 mg by mouth daily.     vitamin C (ASCORBIC ACID) 500 MG tablet Take 1,000 mg by mouth daily.     No current facility-administered medications for this visit.   No Known Allergies  Social History   Socioeconomic History   Marital status: Widowed    Spouse name: Not on file   Number of children: 3    Years of education: Not on file   Highest education level: Not on file  Occupational History   Occupation: retired  Tobacco Use   Smoking status: Never   Smokeless tobacco: Never  Vaping Use   Vaping Use: Never used  Substance and Sexual Activity   Alcohol use: No    Alcohol/week: 0.0 standard drinks of alcohol   Drug use: No  Sexual activity: Not Currently    Birth control/protection: Surgical, Post-menopausal    Comment: Hysterectomy  Other Topics Concern   Not on file  Social History Narrative   Pt does use caffeine. Lives alone. 3 children. Retired.   Social Determinants of Health   Financial Resource Strain: Not on file  Food Insecurity: Not on file  Transportation Needs: Not on file  Physical Activity: Not on file  Stress: Not on file  Social Connections: Not on file  Intimate Partner Violence: Not on file   There were no vitals taken for this visit.  Wt Readings from Last 3 Encounters:  01/22/22 99.9 kg (220 lb 3.2 oz)  12/24/21 97.1 kg (214 lb)  09/18/21 99.1 kg (218 lb 7.6 oz)   PHYSICAL EXAM: General: NAD Neck: No JVD, no thyromegaly or thyroid nodule.  Lungs: Clear to auscultation bilaterally with normal respiratory effort. CV: Nondisplaced PMI.  Heart regular S1/S2, no S3/S4, no murmur.  Bilateral lower leg lymphedema.  No carotid bruit.  Normal pedal pulses.  Abdomen: Soft, nontender, no hepatosplenomegaly, no distention.  Skin: Intact without lesions or rashes.  Neurologic: Alert and oriented x 3.  Psych: Normal affect. Extremities: No clubbing or cyanosis.  HEENT: Normal.   ASSESSMENT & PLAN: 1. Chronic diastolic CHF:  Echo (8/29) with EF 55-60%, normal RV, echo (7/22) with EF 60-65%, normal RV.  Has Cardiomems but has not been using.  Wheelchair-bound, not dyspneic at rest.  She has lymphedema but does not appear volume overloaded.  - I wrote an order for her SNF to help her set up her Cardiomems pillow so she can use the Cardiomems.   -  Continue torsemide 20 mg three times/week. BMET/BNP today.  - Continue carvedilol 3.125 mg bid. - Avoid SGLT2i with UTIs and poor mobility. 2. Lymphedema: Chronic.  3. CKD: Stage 3. BMET today.  - She is followed by Nephrology. 4. Chest pain: LHC 2017 with nonobstructive CAD.    5. HTN: BP stable today. 6. Physical deconditioning: - Continue PT at SNF.  Follow-up: [facility], PA-C  08/05/2022

## 2022-08-06 ENCOUNTER — Encounter (HOSPITAL_COMMUNITY): Payer: Self-pay

## 2022-08-06 ENCOUNTER — Ambulatory Visit (HOSPITAL_COMMUNITY)
Admission: RE | Admit: 2022-08-06 | Discharge: 2022-08-06 | Disposition: A | Discharge status: Home / Self Care | Payer: Medicare Other | Source: Ambulatory Visit | Attending: Physician Assistant | Admitting: Physician Assistant

## 2022-08-06 VITALS — BP 160/90 | HR 71

## 2022-08-06 DIAGNOSIS — I890 Lymphedema, not elsewhere classified: Secondary | ICD-10-CM | POA: Insufficient documentation

## 2022-08-06 DIAGNOSIS — R079 Chest pain, unspecified: Secondary | ICD-10-CM | POA: Diagnosis not present

## 2022-08-06 DIAGNOSIS — Z7901 Long term (current) use of anticoagulants: Secondary | ICD-10-CM | POA: Insufficient documentation

## 2022-08-06 DIAGNOSIS — G8929 Other chronic pain: Secondary | ICD-10-CM | POA: Insufficient documentation

## 2022-08-06 DIAGNOSIS — I10 Essential (primary) hypertension: Secondary | ICD-10-CM

## 2022-08-06 DIAGNOSIS — I2510 Atherosclerotic heart disease of native coronary artery without angina pectoris: Secondary | ICD-10-CM | POA: Insufficient documentation

## 2022-08-06 DIAGNOSIS — G4733 Obstructive sleep apnea (adult) (pediatric): Secondary | ICD-10-CM

## 2022-08-06 DIAGNOSIS — N183 Chronic kidney disease, stage 3 unspecified: Secondary | ICD-10-CM | POA: Insufficient documentation

## 2022-08-06 DIAGNOSIS — Z993 Dependence on wheelchair: Secondary | ICD-10-CM | POA: Diagnosis not present

## 2022-08-06 DIAGNOSIS — M7989 Other specified soft tissue disorders: Secondary | ICD-10-CM

## 2022-08-06 DIAGNOSIS — M797 Fibromyalgia: Secondary | ICD-10-CM | POA: Diagnosis not present

## 2022-08-06 DIAGNOSIS — I5032 Chronic diastolic (congestive) heart failure: Secondary | ICD-10-CM | POA: Diagnosis present

## 2022-08-06 DIAGNOSIS — I130 Hypertensive heart and chronic kidney disease with heart failure and stage 1 through stage 4 chronic kidney disease, or unspecified chronic kidney disease: Secondary | ICD-10-CM | POA: Diagnosis present

## 2022-08-06 DIAGNOSIS — I2699 Other pulmonary embolism without acute cor pulmonale: Secondary | ICD-10-CM | POA: Diagnosis not present

## 2022-08-06 DIAGNOSIS — Z86711 Personal history of pulmonary embolism: Secondary | ICD-10-CM | POA: Diagnosis not present

## 2022-08-06 DIAGNOSIS — Z79899 Other long term (current) drug therapy: Secondary | ICD-10-CM | POA: Diagnosis not present

## 2022-08-06 LAB — BASIC METABOLIC PANEL
Anion gap: 7 (ref 5–15)
BUN: 21 mg/dL (ref 8–23)
CO2: 34 mmol/L — ABNORMAL HIGH (ref 22–32)
Calcium: 10.8 mg/dL — ABNORMAL HIGH (ref 8.9–10.3)
Chloride: 100 mmol/L (ref 98–111)
Creatinine, Ser: 0.94 mg/dL (ref 0.44–1.00)
GFR, Estimated: >60 mL/min (ref >60)
Glucose, Bld: 90 mg/dL (ref 70–99)
Potassium: 4.5 mmol/L (ref 3.5–5.1)
Sodium: 141 mmol/L (ref 135–145)

## 2022-08-06 NOTE — Addendum Note (Signed)
Encounter addended by: Joette Catching, PA-C on: 08/06/2022 3:15 PM  Actions taken: Clinical Note Signed

## 2022-08-06 NOTE — Progress Notes (Signed)
Order for losartan 25 mg one tab daily and repeat bmet x 2 weeks sent to [facility] Ph # [phone] fax [phone]

## 2022-08-06 NOTE — Patient Instructions (Signed)
It was great to see you today! No medication changes are needed at this time.   Labs today We will only contact you if something comes back abnormal or we need to make some changes. Otherwise no news is good news!  Your physician has requested that you have a lower or upper extremity venous duplex. This test is an ultrasound of the veins in the legs or arms. It looks at venous blood flow that carries blood from the heart to the legs or arms. Allow one hour for a Lower Venous exam. Allow thirty minutes for an Upper Venous exam. There are no restrictions or special instructions. -They will be in contact with an appointment  Your physician recommends that you schedule a follow-up appointment in: 4 months  in the Advanced Practitioners (PA/NP) Clinic

## 2022-08-14 ENCOUNTER — Ambulatory Visit (HOSPITAL_COMMUNITY): Payer: Medicare Other

## 2022-08-21 ENCOUNTER — Ambulatory Visit (HOSPITAL_COMMUNITY): Admission: RE | Admit: 2022-08-21 | Payer: Medicare Other | Source: Ambulatory Visit

## 2022-09-28 ENCOUNTER — Ambulatory Visit: Payer: Medicare Other | Admitting: Orthopaedic Surgery

## 2022-10-19 ENCOUNTER — Ambulatory Visit: Payer: Medicare Other | Admitting: Orthopaedic Surgery

## 2022-10-26 ENCOUNTER — Ambulatory Visit: Payer: Medicare Other | Admitting: Orthopaedic Surgery

## 2022-11-11 ENCOUNTER — Ambulatory Visit: Payer: Medicare Other | Admitting: Orthopaedic Surgery

## 2022-11-19 ENCOUNTER — Other Ambulatory Visit (INDEPENDENT_AMBULATORY_CARE_PROVIDER_SITE_OTHER): Payer: 59

## 2022-11-19 ENCOUNTER — Ambulatory Visit: Payer: Medicare Other | Admitting: Orthopaedic Surgery

## 2022-11-19 ENCOUNTER — Ambulatory Visit (INDEPENDENT_AMBULATORY_CARE_PROVIDER_SITE_OTHER): Payer: 59 | Admitting: Orthopaedic Surgery

## 2022-11-19 ENCOUNTER — Encounter: Payer: Self-pay | Admitting: Orthopaedic Surgery

## 2022-11-19 DIAGNOSIS — Z96652 Presence of left artificial knee joint: Secondary | ICD-10-CM | POA: Diagnosis not present

## 2022-11-19 NOTE — Progress Notes (Signed)
The patient is a 77 year old female who has had multiple left knee surgeries.  In January 2023 reveals all components.  Her original surgeries were performed elsewhere.  She ended up with a polyliner exchange at 1 point and then eventually revision of all components to try to get the patella tracked better.  It is always tracked laterally.  She still has patella tracks laterally.  She says she cannot stand because of that.  On my exam her patella does track laterally but the components are stable and I can flex and extend her knee.  X-rays also confirms stable implants except for laterally tracking patella.  The only thing we could consider for her patella tracking laterally is a papillectomy versus further surgery to try to correct that but it will be quite difficult given her obesity and the size of her leg and she said that she would not want surgery either.  Of note she does have a back brace and a knee brace that were reportedly ordered by me but I have no records in front of me today that show that I ever wanted her to have that type of knee brace or back brace and I did do not remember ever ordering these at all.  I do feel that she would benefit from some type of knee sleeve or knee brace but I do not think that will really help a lot with the patella tracking.  The only thing that would potentially help with that is further surgery.  I will certainly talk to her daughter about those possibilities for right now she does not want surgery and I agree with trying to keep her from having any type of surgical intervention.

## 2022-12-07 ENCOUNTER — Ambulatory Visit (HOSPITAL_COMMUNITY)
Admission: RE | Admit: 2022-12-07 | Discharge: 2022-12-07 | Disposition: A | Discharge status: Home / Self Care | Payer: 59 | Source: Ambulatory Visit | Attending: Family Medicine | Admitting: Family Medicine

## 2022-12-07 ENCOUNTER — Encounter (HOSPITAL_COMMUNITY): Payer: Self-pay

## 2022-12-07 VITALS — BP 124/66 | HR 56 | Wt 217.0 lb

## 2022-12-07 DIAGNOSIS — I5032 Chronic diastolic (congestive) heart failure: Secondary | ICD-10-CM | POA: Diagnosis present

## 2022-12-07 DIAGNOSIS — Z8249 Family history of ischemic heart disease and other diseases of the circulatory system: Secondary | ICD-10-CM | POA: Insufficient documentation

## 2022-12-07 DIAGNOSIS — Z79899 Other long term (current) drug therapy: Secondary | ICD-10-CM | POA: Insufficient documentation

## 2022-12-07 DIAGNOSIS — N183 Chronic kidney disease, stage 3 unspecified: Secondary | ICD-10-CM

## 2022-12-07 DIAGNOSIS — I2510 Atherosclerotic heart disease of native coronary artery without angina pectoris: Secondary | ICD-10-CM | POA: Diagnosis not present

## 2022-12-07 DIAGNOSIS — Z993 Dependence on wheelchair: Secondary | ICD-10-CM | POA: Insufficient documentation

## 2022-12-07 DIAGNOSIS — J449 Chronic obstructive pulmonary disease, unspecified: Secondary | ICD-10-CM | POA: Insufficient documentation

## 2022-12-07 DIAGNOSIS — I130 Hypertensive heart and chronic kidney disease with heart failure and stage 1 through stage 4 chronic kidney disease, or unspecified chronic kidney disease: Secondary | ICD-10-CM | POA: Diagnosis not present

## 2022-12-07 DIAGNOSIS — Z87440 Personal history of urinary (tract) infections: Secondary | ICD-10-CM | POA: Insufficient documentation

## 2022-12-07 DIAGNOSIS — R5381 Other malaise: Secondary | ICD-10-CM

## 2022-12-07 DIAGNOSIS — G4733 Obstructive sleep apnea (adult) (pediatric): Secondary | ICD-10-CM

## 2022-12-07 DIAGNOSIS — R079 Chest pain, unspecified: Secondary | ICD-10-CM | POA: Diagnosis not present

## 2022-12-07 DIAGNOSIS — M797 Fibromyalgia: Secondary | ICD-10-CM | POA: Diagnosis not present

## 2022-12-07 DIAGNOSIS — Z7982 Long term (current) use of aspirin: Secondary | ICD-10-CM | POA: Insufficient documentation

## 2022-12-07 DIAGNOSIS — Z86711 Personal history of pulmonary embolism: Secondary | ICD-10-CM | POA: Insufficient documentation

## 2022-12-07 DIAGNOSIS — I2699 Other pulmonary embolism without acute cor pulmonale: Secondary | ICD-10-CM

## 2022-12-07 DIAGNOSIS — N1831 Chronic kidney disease, stage 3a: Secondary | ICD-10-CM | POA: Diagnosis not present

## 2022-12-07 DIAGNOSIS — I890 Lymphedema, not elsewhere classified: Secondary | ICD-10-CM | POA: Diagnosis not present

## 2022-12-07 DIAGNOSIS — I10 Essential (primary) hypertension: Secondary | ICD-10-CM

## 2022-12-07 LAB — CBC
HCT: 37.6 % (ref 36.0–46.0)
Hemoglobin: 11.6 g/dL — ABNORMAL LOW (ref 12.0–15.0)
MCH: 31.4 pg (ref 26.0–34.0)
MCHC: 30.9 g/dL (ref 30.0–36.0)
MCV: 101.9 fL — ABNORMAL HIGH (ref 80.0–100.0)
Platelets: 163 10*3/uL (ref 150–400)
RBC: 3.69 MIL/uL — ABNORMAL LOW (ref 3.87–5.11)
RDW: 16 % — ABNORMAL HIGH (ref 11.5–15.5)
WBC: 7.1 10*3/uL (ref 4.0–10.5)
nRBC: 0 % (ref 0.0–0.2)

## 2022-12-07 LAB — BASIC METABOLIC PANEL
Anion gap: 10 (ref 5–15)
BUN: 27 mg/dL — ABNORMAL HIGH (ref 8–23)
CO2: 28 mmol/L (ref 22–32)
Calcium: 10.7 mg/dL — ABNORMAL HIGH (ref 8.9–10.3)
Chloride: 101 mmol/L (ref 98–111)
Creatinine, Ser: 1.07 mg/dL — ABNORMAL HIGH (ref 0.44–1.00)
GFR, Estimated: 53 mL/min — ABNORMAL LOW (ref >60)
Glucose, Bld: 98 mg/dL (ref 70–99)
Potassium: 4.9 mmol/L (ref 3.5–5.1)
Sodium: 139 mmol/L (ref 135–145)

## 2022-12-07 LAB — BRAIN NATRIURETIC PEPTIDE: B Natriuretic Peptide: 59.2 pg/mL (ref 0.0–100.0)

## 2022-12-07 MED ORDER — ASPIRIN 81 MG PO TBEC: 81.0000 mg | DELAYED_RELEASE_TABLET | Freq: Every day | ORAL | 2 refills | Status: DC

## 2022-12-07 MED ORDER — AMLODIPINE BESYLATE 10 MG PO TABS: 10.0000 mg | ORAL_TABLET | Freq: Every day | ORAL | Status: DC

## 2022-12-07 NOTE — Progress Notes (Signed)
Advanced Heart Failure Clinic Note   Primary Care: Dr. Lysbeth Galas Renal: Dr Marisue Humble HF Cardiology: Dr. Shirlee Latch  HPI: [name] is a 77 y.o. with history of chronic diastolic CHF (Echo 03/20/16 LVEF 65%, Mild LAE), chronic lymphedema, chronic back pain, HTN, CKD stage 3, OSA and fibromyalgia.    Admitted several times in 02/2016 to [facility] with CHF and chest pain.  Has been tried on spironolactone + metolazone alone as diuretic regimen.   She was admitted in 9/17 with chest pain and AKI.  She had seen nephrology and was given metolazone to take daily along with Lasix, creatinine rose considerably.  Lasix was decreased to 40 mg bid.  When creatinine improved, she had right and left heart cath showing nonobstructive CAD and near-normal filling pressures.  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    Had Cardiomems placed on 11/13/16. PA diastolic 9 mmHg at implant time.   Echo 7/22 showed EF 60-65%, normal RV.   She was admitted in 7/22 with acute metabolic encephalopathy, thought to be due to UTI.   S/p revision arthroplasty of left total knee 1/23. Discharged back to SNF.  Admitted to [facility] 4/23 with fall and a/c CHF. Diuresed 20 lbs with IV lasix. Discharged on torsemide 80 mg daily. Seen in ED a few days later with AMS. UTI work up negative but gently hydrated and given empiric abx. Remeron, baclofen and gabapentin were down titrated.   Admitted in 6/23 with PE and PNA.  Now on apixaban.   Admitted in 8/23 with viral URI.   Admit to [facility] 11/23 after presenting with dyspnea and worsening confusion/sleepiness. Apparently was hypoxemic and hypercarbic. Daughter reports respiratory failure was felt to be d/t untreated OSA. She had recently started abx for possible UTI, treatment continued during hospital course. V/Q scan mentions intermediate probability of PE but no perfusion defects reported. CTA chest with possible trace pulmonary emboli in  subsegmental pulmonary arteries in LLL. Uncertain if artifact. No evidence of PE on repeat CTA. Metallic density noted in left lower lobe segmental artery (likely representing cardiomems). There was question if cardiomems device had migrated. F/u with our office was recommended. She was continued on Eliquis (on since June 2023 for suspected PE) until f/u today.  Echo at [facility] 11/23: EF 60-65%, mild DD, RV okay  Follow up 12/23, remained at [facility]. BP elevated and losartan started. Planned to check venous duplex (? Concern for PE during 11/23 admission) and if neg, stop Eliquis and Plavix, and continue on ASA.  Admitted 1/24 with COPD exacerbation, admitted to [facility] 2/24 with a/c respiratory failure 2/2 COVID +. Seen in ED 11/03/22 after falling out of her WC, no LOC. CT head neg, no acute fractures of extremities.  Seen in [facility] ED 12/05/22 with CP, felt to be mildly dry and given IVF. BNP and CXR stable. HsTroponin flat. Pain reproducible on exam and not nitro-responsive, so felt to be MSK in nature.  Today she returns for HF follow up, her daughter is present via phone. Overall feeling fine. She resides at [facility] No longer having CP. No SOB with transfers out of her WC, she uses a sit and stand lift for transfers out of her WC. Occasional dizziness when sitting back down in her chair from being in standing position, no falls. Denies palpitations, abnormal bleeding, or PND/Orthopnea. Appetite ok. No fever or chills. Weight at facility stable. Taking all medications provided by facility. Recently had UTI. CPAP broken,  awaiting replacement part. Pulmonary planning on re-doing sleep study and switching to BiPap. Facility not helping her use her Cardiomems, despite daughter's attempts.  ECG (personally reviewed): patient left before ECG could be completed.  Labs (1/19): K 4, creatinine 1.46 Labs (7/22): K 3.8, creatinine 1.04.  Labs (4/23): K 4.1, creatinine 1.30 Labs (5/23): K 4.4, creatinine  1.22 Labs (8/23): K 3.8, creatinine 1.0 Labs (4/24): K 5.0, creatinine 1.21  Review of systems complete and found to be negative unless listed in HPI.    Past Medical History 1. Chronic diastolic CHF - Echo 03/20/16 LVEF 65%, Mild LAE - RHC (10/17): mean RA 4, PA 25/11, mean PCWP 5, CI 2.4.  - Echo (2/18): EF 55-60%, mild LVH, normal RV size and systolic function. - Cardiomems implanted 3/18.  - Echo (7/22): EF 60-65%, normal RV, IVC normal 2. CKD stage 3: Follows with nephology. Baseline appears to be creatinine 1.4-1.6.  3. Chronic lymphedema: Has had wraps.  4. HTN 5. Dementia 6. Fibromyalgia 7. Microvascular angina:  - Normal coronaries on Cath 2009 - Stress Myoview 04/09/16 with no ischemia - LHC (10/17) with mild nonobstructive disease.  8. Restless leg syndrome 9. H/o shoulder replacement.  10. OSA: CPAP.  11. PE 6/23  FH: Mother with CHF, brother with "heart disease."  Another brother with h/o MI.   Current Outpatient Medications  Medication Sig Dispense Refill   acetaminophen (TYLENOL) 325 MG tablet Take 650 mg by mouth every 4 (four) hours as needed (Pain).     albuterol (VENTOLIN HFA) 108 (90 Base) MCG/ACT inhaler Inhale 2 puffs into the lungs every 4 (four) hours as needed for wheezing.     amLODipine (NORVASC) 2.5 MG tablet Take 2.5 mg by mouth daily.     atorvastatin (LIPITOR) 40 MG tablet Take 40 mg by mouth every evening.     budesonide-formoterol (SYMBICORT) 160-4.5 MCG/ACT inhaler Inhale 2 puffs into the lungs in the morning and at bedtime.     carvedilol (COREG) 3.125 MG tablet Take 3.125 mg by mouth 2 (two) times daily with a meal.     cloNIDine (CATAPRES) 0.1 MG tablet Take by mouth.     clopidogrel (PLAVIX) 75 MG tablet Take 75 mg by mouth daily.     donepezil (ARICEPT) 10 MG tablet Take 20 mg by mouth at bedtime.     ferrous sulfate 325 (65 FE) MG EC tablet Take 325 mg by mouth daily with breakfast.     fluticasone (FLONASE) 50 MCG/ACT nasal spray 2  sprays daily.     guaifenesin (GERI-TUSSIN) 100 MG/5ML syrup Take by mouth.     ipratropium-albuterol (DUONEB) 0.5-2.5 (3) MG/3ML SOLN Take 3 mLs by nebulization every 6 (six) hours as needed.     lamoTRIgine (LAMICTAL) 100 MG tablet Take 100 mg by mouth 2 (two) times daily. (0900 & 2100)     levothyroxine (SYNTHROID, LEVOTHROID) 88 MCG tablet Take 88 mcg by mouth daily before breakfast. (0600)     lidocaine (LIDODERM) 5 % Place 1 patch onto the skin daily. Remove patch at 1900     ondansetron (ZOFRAN-ODT) 4 MG disintegrating tablet Take by mouth.     OXYGEN Inhale 2 L into the lungs See admin instructions. At bedtime and prn for shortness of breath     polyethylene glycol (MIRALAX / GLYCOLAX) 17 g packet Take 17 g by mouth daily. 0600 Mix in 4-8 oz of Fluid of choice     pregabalin (LYRICA) 75 MG capsule Take 75 mg by  mouth 3 (three) times daily.     rOPINIRole (REQUIP) 4 MG tablet Take 4 mg by mouth in the morning, at noon, and at bedtime.     sennosides-docusate sodium (SENOKOT-S) 8.6-50 MG tablet Take 1 tablet by mouth daily.     torsemide (DEMADEX) 20 MG tablet Patient takes 1 tablet by mouth every other day for edema.     venlafaxine (EFFEXOR) 75 MG tablet Take 75 mg by mouth daily.     vitamin C (ASCORBIC ACID) 500 MG tablet Take 1,000 mg by mouth daily.     apixaban (ELIQUIS) 5 MG TABS tablet Take 5 mg by mouth 2 (two) times daily.     Cholecalciferol (VITAMIN D3) 50 MCG (2000 UT) TABS Take 2,000 Units by mouth daily. (0900)     MYRBETRIQ 50 MG TB24 tablet Take 50 mg by mouth daily.     omeprazole (PRILOSEC) 20 MG capsule Take 20 mg by mouth daily.     Potassium Chloride ER 20 MEQ TBCR Take by mouth.     No current facility-administered medications for this encounter.   No Known Allergies  Social History   Socioeconomic History   Marital status: Widowed    Spouse name: Not on file   Number of children: 3   Years of education: Not on file   Highest education level: Not on file   Occupational History   Occupation: retired  Tobacco Use   Smoking status: Never   Smokeless tobacco: Never  Vaping Use   Vaping Use: Never used  Substance and Sexual Activity   Alcohol use: No    Alcohol/week: 0.0 standard drinks of alcohol   Drug use: No   Sexual activity: Not Currently    Birth control/protection: Surgical, Post-menopausal    Comment: Hysterectomy  Other Topics Concern   Not on file  Social History Narrative   Pt does use caffeine. Lives alone. 3 children. Retired.   Social Determinants of Health   Financial Resource Strain: Not on file  Food Insecurity: Not on file  Transportation Needs: Not on file  Physical Activity: Not on file  Stress: Not on file  Social Connections: Not on file  Intimate Partner Violence: Not on file   BP 124/66   Pulse (!) 56   Wt 98.4 kg (217 lb) Comment: Patient sait she was weighed this morning at the facility.  SpO2 97% Comment: 2-liters  BMI 36.11 kg/m   Wt Readings from Last 3 Encounters:  12/07/22 98.4 kg (217 lb)  01/22/22 99.9 kg (220 lb 3.2 oz)  12/24/21 97.1 kg (214 lb)   PHYSICAL EXAM: General:  NAD. No resp difficulty, arrived in [facility], elderly HEENT: Normal Neck: Supple. No JVD. Carotids 2+ bilat; no bruits. No lymphadenopathy or thryomegaly appreciated. Cor: PMI nondisplaced. Regular rate & rhythm. No rubs, gallops or murmurs. Lungs: Clear Abdomen: Soft, nontender, nondistended. No hepatosplenomegaly. No bruits or masses. Good bowel sounds. Extremities: No cyanosis, clubbing, rash, chronic BLE lymphedema Neuro: Alert & oriented x 3, cranial nerves grossly intact. Moves all 4 extremities w/o difficulty. Affect pleasant.  ASSESSMENT & PLAN: 1. Chronic diastolic CHF:  Echo (2/18) with EF 55-60%, normal RV, echo (7/22) with EF 60-65%, normal RV.  Has Cardiomems but has not been using.  Wheelchair-bound, not dyspneic at rest or with transfers.  She has lymphedema but does not appear volume overloaded. NYHA  difficult to assess d/t mobility but likely II-III. - Continue torsemide 20 mg three times/week. BMET/BNP today.  - Continue carvedilol  3.125 mg bid. - Avoid SGLT2i with UTIs and poor mobility. - Update echo. - Given Rx for facility to help her use her Cardiomems daily. 2. Lymphedema: Chronic.  - Wears compression stockings 3. CKD: Stage 3a. She is followed by Nephrology. - BMET today. 4. Chest pain: LHC 2017 with nonobstructive CAD.   Recent ED visit with CP, HsTroponins flat, CXR and ECG reassuring. Not responsive to nitrates and reproducible with palpation, likely MSK. 5. HTN: BP stable today. Will try to ease pill burden. - Stop clonidine. - Increase amlodipine to 10 mg daily. 6. Physical deconditioning: Continue PT at SNF 7. OSA: Continue CPAP. 8. Hx PE: PE 01/2022. Started on Eliquis. There was concern for PE during recent admit 11/23. V/Q scan mentions intermediate probability of PE but no perfusion defects reported. CTA chest with possible trace pulmonary emboli in subsegmental pulmonary arteries in LLL. Uncertain if artifact. No evidence of PE on repeat CTA 2 days later.  - Now off Eliquis. Stop Plavix (she never stopped after Cardiomems placement, no hx of CAD or CVA). - Start ASA 81 mg daily.  Follow up in 4 months with Dr. Shirlee Latch. Update echo.  [employer], [place]  12/07/2022

## 2022-12-07 NOTE — Addendum Note (Signed)
Encounter addended by: Burna Sis, LCSW on: 12/07/2022 4:13 PM  Actions taken: Flowsheet accepted, Clinical Note Signed

## 2022-12-07 NOTE — Patient Instructions (Addendum)
INSTRUCTIONS BELOW WERE GIVEN TO PATIENT VIA SNF ORDERS STOP Plavix START Aspirin 81 mg one tab daily  STOP Clonidine INCREASE Amlodipine to 10 mg one tab daily  Be sure to submit a cardiomems reading daily  Your physician recommends that you schedule a follow-up appointment in: 4 months with echo  .

## 2022-12-07 NOTE — Transportation (Signed)
H&V Care Navigation CSW Progress Note  Clinical Social Worker consulted to help with transportation back home. Pt came by [facility] transport and has been waiting 2 hours for her ride.  CSW assisted in calling [facility] transport and coordinating her pick up- spent 1 hour assisted pt in this and providing support given stressful situation.   SDOH Screenings   Depression (PHQ2-9): Low Risk  (06/26/2021)  Tobacco Use: Low Risk  (12/07/2022)      Burna Sis, LCSW Clinical Social Worker Advanced Heart Failure Clinic Desk#: [phone] Cell#: [phone]

## 2023-01-26 ENCOUNTER — Telehealth (HOSPITAL_COMMUNITY): Payer: Self-pay

## 2023-01-26 NOTE — Telephone Encounter (Signed)
Left message for patient to return call in reference to her sending in a Cardiomems transmission.

## 2023-02-10 ENCOUNTER — Ambulatory Visit (INDEPENDENT_AMBULATORY_CARE_PROVIDER_SITE_OTHER): Payer: 59 | Admitting: Orthopaedic Surgery

## 2023-02-10 DIAGNOSIS — M25561 Pain in right knee: Secondary | ICD-10-CM | POA: Diagnosis not present

## 2023-02-10 DIAGNOSIS — Z96652 Presence of left artificial knee joint: Secondary | ICD-10-CM | POA: Diagnosis not present

## 2023-02-10 DIAGNOSIS — G8929 Other chronic pain: Secondary | ICD-10-CM | POA: Diagnosis not present

## 2023-02-10 DIAGNOSIS — M25562 Pain in left knee: Secondary | ICD-10-CM | POA: Diagnosis not present

## 2023-02-10 NOTE — Progress Notes (Signed)
The patient is well-known to [place].  She has had multiple surgeries on her left knee with the most recent being a knee revision due to malalignment and pain.  She has a previous knee with I believe a revision on the right side remotely.  She has significant lymphedema in both of her legs and goes to lymphedema clinic.  She mainly gets around in a wheelchair.  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  She was able to get her daughter on the phone so we can speak today as well.  She is 77 years old and has limited mobility.  Neither knee shows any evidence of infection and both knees are ligamentously stable.  The left knee has a patella does sublux.  Given her significant comorbidities and the severe lymphedema in her legs, I would not recommend any type of surgical intervention.  It will even be difficult in getting knee braces or knee sleeves to fit on her.  They understand that we really do not have much to offer from a surgical standpoint at.  All question concerns were answered addressed.  Follow-up is as needed.  She will continue therapy in her skilled nursing facility.

## 2023-03-19 ENCOUNTER — Ambulatory Visit (HOSPITAL_COMMUNITY): Payer: 59

## 2023-03-19 ENCOUNTER — Encounter (HOSPITAL_COMMUNITY): Payer: 59 | Admitting: Cardiology

## 2023-04-25 DEATH — deceased

## 2023-06-08 ENCOUNTER — Telehealth (HOSPITAL_COMMUNITY): Payer: Self-pay | Admitting: Cardiology

## 2023-06-08 NOTE — Telephone Encounter (Signed)
Chart updated to document pt is deceased  Pt passed away 04/22/2023
